# Patient Record
Sex: Female | Born: 1970 | Race: Black or African American | Hispanic: No | Marital: Single | Smoking: Never smoker
Health system: Southern US, Community
[De-identification: ages and names within clinical notes are randomized; demographics above are authoritative.]

## PROBLEM LIST (undated history)

## (undated) DIAGNOSIS — M726 Necrotizing fasciitis: Secondary | ICD-10-CM

## (undated) DIAGNOSIS — F1010 Alcohol abuse, uncomplicated: Secondary | ICD-10-CM

## (undated) HISTORY — DX: Necrotizing fasciitis: M72.6

## (undated) HISTORY — PX: TONSILLECTOMY: SUR1361

---

## 2019-11-06 ENCOUNTER — Other Ambulatory Visit: Payer: Self-pay

## 2019-12-21 ENCOUNTER — Emergency Department (HOSPITAL_COMMUNITY): Payer: Medicaid Other

## 2019-12-21 ENCOUNTER — Other Ambulatory Visit: Payer: Self-pay

## 2019-12-21 ENCOUNTER — Inpatient Hospital Stay (HOSPITAL_COMMUNITY)
Admission: EM | Admit: 2019-12-21 | Discharge: 2020-02-03 | DRG: 853 | Disposition: A | Discharge status: Skilled Nursing Facility | Payer: Self-pay | Attending: Internal Medicine | Admitting: Internal Medicine

## 2019-12-21 ENCOUNTER — Encounter (HOSPITAL_COMMUNITY): Payer: Self-pay

## 2019-12-21 DIAGNOSIS — R7303 Prediabetes: Secondary | ICD-10-CM | POA: Diagnosis present

## 2019-12-21 DIAGNOSIS — D62 Acute posthemorrhagic anemia: Secondary | ICD-10-CM | POA: Diagnosis not present

## 2019-12-21 DIAGNOSIS — R339 Retention of urine, unspecified: Secondary | ICD-10-CM | POA: Diagnosis not present

## 2019-12-21 DIAGNOSIS — Z09 Encounter for follow-up examination after completed treatment for conditions other than malignant neoplasm: Secondary | ICD-10-CM

## 2019-12-21 DIAGNOSIS — Z20822 Contact with and (suspected) exposure to covid-19: Secondary | ICD-10-CM | POA: Diagnosis present

## 2019-12-21 DIAGNOSIS — N179 Acute kidney failure, unspecified: Secondary | ICD-10-CM | POA: Diagnosis present

## 2019-12-21 DIAGNOSIS — R0600 Dyspnea, unspecified: Secondary | ICD-10-CM

## 2019-12-21 DIAGNOSIS — Z4659 Encounter for fitting and adjustment of other gastrointestinal appliance and device: Secondary | ICD-10-CM

## 2019-12-21 DIAGNOSIS — J9690 Respiratory failure, unspecified, unspecified whether with hypoxia or hypercapnia: Secondary | ICD-10-CM

## 2019-12-21 DIAGNOSIS — J9600 Acute respiratory failure, unspecified whether with hypoxia or hypercapnia: Secondary | ICD-10-CM

## 2019-12-21 DIAGNOSIS — W19XXXA Unspecified fall, initial encounter: Secondary | ICD-10-CM

## 2019-12-21 DIAGNOSIS — R601 Generalized edema: Secondary | ICD-10-CM

## 2019-12-21 DIAGNOSIS — F17200 Nicotine dependence, unspecified, uncomplicated: Secondary | ICD-10-CM | POA: Diagnosis present

## 2019-12-21 DIAGNOSIS — D696 Thrombocytopenia, unspecified: Secondary | ICD-10-CM | POA: Diagnosis present

## 2019-12-21 DIAGNOSIS — K7290 Hepatic failure, unspecified without coma: Secondary | ICD-10-CM | POA: Diagnosis present

## 2019-12-21 DIAGNOSIS — R6521 Severe sepsis with septic shock: Secondary | ICD-10-CM | POA: Diagnosis not present

## 2019-12-21 DIAGNOSIS — E43 Unspecified severe protein-calorie malnutrition: Secondary | ICD-10-CM | POA: Diagnosis not present

## 2019-12-21 DIAGNOSIS — I10 Essential (primary) hypertension: Secondary | ICD-10-CM | POA: Diagnosis present

## 2019-12-21 DIAGNOSIS — K7011 Alcoholic hepatitis with ascites: Secondary | ICD-10-CM | POA: Diagnosis present

## 2019-12-21 DIAGNOSIS — R491 Aphonia: Secondary | ICD-10-CM

## 2019-12-21 DIAGNOSIS — G9341 Metabolic encephalopathy: Secondary | ICD-10-CM | POA: Diagnosis present

## 2019-12-21 DIAGNOSIS — L89322 Pressure ulcer of left buttock, stage 2: Secondary | ICD-10-CM | POA: Diagnosis present

## 2019-12-21 DIAGNOSIS — R296 Repeated falls: Secondary | ICD-10-CM | POA: Diagnosis present

## 2019-12-21 DIAGNOSIS — Z1621 Resistance to vancomycin: Secondary | ICD-10-CM | POA: Diagnosis present

## 2019-12-21 DIAGNOSIS — R0689 Other abnormalities of breathing: Secondary | ICD-10-CM

## 2019-12-21 DIAGNOSIS — F10239 Alcohol dependence with withdrawal, unspecified: Secondary | ICD-10-CM | POA: Diagnosis present

## 2019-12-21 DIAGNOSIS — E878 Other disorders of electrolyte and fluid balance, not elsewhere classified: Secondary | ICD-10-CM | POA: Diagnosis not present

## 2019-12-21 DIAGNOSIS — L245 Irritant contact dermatitis due to other chemical products: Secondary | ICD-10-CM | POA: Diagnosis not present

## 2019-12-21 DIAGNOSIS — E8770 Fluid overload, unspecified: Secondary | ICD-10-CM | POA: Diagnosis not present

## 2019-12-21 DIAGNOSIS — E874 Mixed disorder of acid-base balance: Secondary | ICD-10-CM | POA: Diagnosis present

## 2019-12-21 DIAGNOSIS — R0682 Tachypnea, not elsewhere classified: Secondary | ICD-10-CM

## 2019-12-21 DIAGNOSIS — Z1611 Resistance to penicillins: Secondary | ICD-10-CM | POA: Diagnosis present

## 2019-12-21 DIAGNOSIS — R600 Localized edema: Secondary | ICD-10-CM | POA: Diagnosis not present

## 2019-12-21 DIAGNOSIS — K626 Ulcer of anus and rectum: Secondary | ICD-10-CM | POA: Diagnosis not present

## 2019-12-21 DIAGNOSIS — L03317 Cellulitis of buttock: Secondary | ICD-10-CM | POA: Diagnosis present

## 2019-12-21 DIAGNOSIS — K921 Melena: Secondary | ICD-10-CM | POA: Diagnosis not present

## 2019-12-21 DIAGNOSIS — R34 Anuria and oliguria: Secondary | ICD-10-CM | POA: Diagnosis not present

## 2019-12-21 DIAGNOSIS — K7682 Hepatic encephalopathy: Secondary | ICD-10-CM | POA: Diagnosis present

## 2019-12-21 DIAGNOSIS — Z6841 Body Mass Index (BMI) 40.0 and over, adult: Secondary | ICD-10-CM

## 2019-12-21 DIAGNOSIS — R7989 Other specified abnormal findings of blood chemistry: Secondary | ICD-10-CM

## 2019-12-21 DIAGNOSIS — L89816 Pressure-induced deep tissue damage of head: Secondary | ICD-10-CM | POA: Diagnosis not present

## 2019-12-21 DIAGNOSIS — J9811 Atelectasis: Secondary | ICD-10-CM | POA: Diagnosis not present

## 2019-12-21 DIAGNOSIS — M726 Necrotizing fasciitis: Secondary | ICD-10-CM | POA: Diagnosis present

## 2019-12-21 DIAGNOSIS — A4181 Sepsis due to Enterococcus: Principal | ICD-10-CM | POA: Diagnosis present

## 2019-12-21 DIAGNOSIS — D6489 Other specified anemias: Secondary | ICD-10-CM | POA: Diagnosis present

## 2019-12-21 DIAGNOSIS — A4159 Other Gram-negative sepsis: Secondary | ICD-10-CM | POA: Diagnosis present

## 2019-12-21 DIAGNOSIS — Z880 Allergy status to penicillin: Secondary | ICD-10-CM

## 2019-12-21 DIAGNOSIS — E870 Hyperosmolality and hypernatremia: Secondary | ICD-10-CM | POA: Diagnosis not present

## 2019-12-21 DIAGNOSIS — D6959 Other secondary thrombocytopenia: Secondary | ICD-10-CM | POA: Diagnosis present

## 2019-12-21 DIAGNOSIS — K567 Ileus, unspecified: Secondary | ICD-10-CM | POA: Diagnosis not present

## 2019-12-21 DIAGNOSIS — L8990 Pressure ulcer of unspecified site, unspecified stage: Secondary | ICD-10-CM | POA: Insufficient documentation

## 2019-12-21 DIAGNOSIS — E876 Hypokalemia: Secondary | ICD-10-CM | POA: Diagnosis not present

## 2019-12-21 DIAGNOSIS — Z66 Do not resuscitate: Secondary | ICD-10-CM | POA: Diagnosis not present

## 2019-12-21 DIAGNOSIS — Z01818 Encounter for other preprocedural examination: Secondary | ICD-10-CM

## 2019-12-21 DIAGNOSIS — K117 Disturbances of salivary secretion: Secondary | ICD-10-CM

## 2019-12-21 DIAGNOSIS — E860 Dehydration: Secondary | ICD-10-CM | POA: Diagnosis present

## 2019-12-21 DIAGNOSIS — J9601 Acute respiratory failure with hypoxia: Secondary | ICD-10-CM | POA: Diagnosis not present

## 2019-12-21 DIAGNOSIS — Z933 Colostomy status: Secondary | ICD-10-CM

## 2019-12-21 DIAGNOSIS — E871 Hypo-osmolality and hyponatremia: Secondary | ICD-10-CM | POA: Diagnosis present

## 2019-12-21 DIAGNOSIS — Z452 Encounter for adjustment and management of vascular access device: Secondary | ICD-10-CM

## 2019-12-21 DIAGNOSIS — A419 Sepsis, unspecified organism: Secondary | ICD-10-CM

## 2019-12-21 DIAGNOSIS — Z0189 Encounter for other specified special examinations: Secondary | ICD-10-CM

## 2019-12-21 DIAGNOSIS — Y92009 Unspecified place in unspecified non-institutional (private) residence as the place of occurrence of the external cause: Secondary | ICD-10-CM

## 2019-12-21 DIAGNOSIS — R531 Weakness: Secondary | ICD-10-CM

## 2019-12-21 DIAGNOSIS — J3801 Paralysis of vocal cords and larynx, unilateral: Secondary | ICD-10-CM | POA: Diagnosis not present

## 2019-12-21 DIAGNOSIS — Z23 Encounter for immunization: Secondary | ICD-10-CM

## 2019-12-21 DIAGNOSIS — F1020 Alcohol dependence, uncomplicated: Secondary | ICD-10-CM | POA: Diagnosis present

## 2019-12-21 DIAGNOSIS — K7010 Alcoholic hepatitis without ascites: Secondary | ICD-10-CM

## 2019-12-21 DIAGNOSIS — K709 Alcoholic liver disease, unspecified: Secondary | ICD-10-CM

## 2019-12-21 DIAGNOSIS — K7460 Unspecified cirrhosis of liver: Secondary | ICD-10-CM | POA: Diagnosis present

## 2019-12-21 DIAGNOSIS — R54 Age-related physical debility: Secondary | ICD-10-CM | POA: Diagnosis present

## 2019-12-21 DIAGNOSIS — A4151 Sepsis due to Escherichia coli [E. coli]: Secondary | ICD-10-CM | POA: Diagnosis present

## 2019-12-21 LAB — RAPID URINE DRUG SCREEN, HOSP PERFORMED
Amphetamines: NOT DETECTED
Barbiturates: NOT DETECTED
Benzodiazepines: NOT DETECTED
Cocaine: NOT DETECTED
Opiates: NOT DETECTED
Tetrahydrocannabinol: NOT DETECTED

## 2019-12-21 LAB — COMPREHENSIVE METABOLIC PANEL
ALT: 58 U/L — ABNORMAL HIGH (ref 0–44)
AST: 199 U/L — ABNORMAL HIGH (ref 15–41)
Albumin: 1.8 g/dL — ABNORMAL LOW (ref 3.5–5.0)
Alkaline Phosphatase: 242 U/L — ABNORMAL HIGH (ref 38–126)
Anion gap: 15 (ref 5–15)
BUN: 35 mg/dL — ABNORMAL HIGH (ref 6–20)
CO2: 19 mmol/L — ABNORMAL LOW (ref 22–32)
Calcium: 8.2 mg/dL — ABNORMAL LOW (ref 8.9–10.3)
Chloride: 97 mmol/L — ABNORMAL LOW (ref 98–111)
Creatinine, Ser: 1.74 mg/dL — ABNORMAL HIGH (ref 0.44–1.00)
GFR calc Af Amer: 40 mL/min — ABNORMAL LOW (ref >60)
GFR calc non Af Amer: 35 mL/min — ABNORMAL LOW (ref >60)
Glucose, Bld: 110 mg/dL — ABNORMAL HIGH (ref 70–99)
Potassium: 4.4 mmol/L (ref 3.5–5.1)
Sodium: 131 mmol/L — ABNORMAL LOW (ref 135–145)
Total Bilirubin: 3.5 mg/dL — ABNORMAL HIGH (ref 0.3–1.2)
Total Protein: 5.6 g/dL — ABNORMAL LOW (ref 6.5–8.1)

## 2019-12-21 LAB — ACETAMINOPHEN LEVEL: Acetaminophen (Tylenol), Serum: <10 ug/mL — ABNORMAL LOW (ref 10–30)

## 2019-12-21 LAB — URINALYSIS, ROUTINE W REFLEX MICROSCOPIC
Glucose, UA: NEGATIVE mg/dL
Ketones, ur: 5 mg/dL — AB
Nitrite: NEGATIVE
Protein, ur: 100 mg/dL — AB
Specific Gravity, Urine: 1.024 (ref 1.005–1.030)
pH: 5 (ref 5.0–8.0)

## 2019-12-21 LAB — CBC
HCT: 45 % (ref 36.0–46.0)
Hemoglobin: 14.5 g/dL (ref 12.0–15.0)
MCH: 32.3 pg (ref 26.0–34.0)
MCHC: 32.2 g/dL (ref 30.0–36.0)
MCV: 100.2 fL — ABNORMAL HIGH (ref 80.0–100.0)
Platelets: 67 10*3/uL — ABNORMAL LOW (ref 150–400)
RBC: 4.49 MIL/uL (ref 3.87–5.11)
RDW: 14.9 % (ref 11.5–15.5)
WBC: 11.8 10*3/uL — ABNORMAL HIGH (ref 4.0–10.5)
nRBC: 0 % (ref 0.0–0.2)

## 2019-12-21 LAB — SARS CORONAVIRUS 2 BY RT PCR (HOSPITAL ORDER, PERFORMED IN ~~LOC~~ HOSPITAL LAB): SARS Coronavirus 2: NEGATIVE

## 2019-12-21 LAB — CBG MONITORING, ED: Glucose-Capillary: 127 mg/dL — ABNORMAL HIGH (ref 70–99)

## 2019-12-21 LAB — SALICYLATE LEVEL: Salicylate Lvl: <7 mg/dL — ABNORMAL LOW (ref 7.0–30.0)

## 2019-12-21 LAB — HCG, QUANTITATIVE, PREGNANCY: hCG, Beta Chain, Quant, S: 1 m[IU]/mL (ref <5)

## 2019-12-21 LAB — AMMONIA: Ammonia: 76 umol/L — ABNORMAL HIGH (ref 9–35)

## 2019-12-21 LAB — ETHANOL: Alcohol, Ethyl (B): <10 mg/dL (ref <10)

## 2019-12-21 LAB — LACTIC ACID, PLASMA: Lactic Acid, Venous: 3.6 mmol/L (ref 0.5–1.9)

## 2019-12-21 MED ORDER — THIAMINE HCL 100 MG/ML IJ SOLN
Freq: Once | INTRAVENOUS | Status: AC
  Filled 2019-12-21: qty 1000

## 2019-12-21 MED ORDER — ACETAMINOPHEN 325 MG PO TABS
650.0000 mg | ORAL_TABLET | Freq: Four times a day (QID) | ORAL | Status: DC | PRN
  Administered 2019-12-21: 650 mg via ORAL
  Filled 2019-12-21: qty 2

## 2019-12-21 MED ORDER — SODIUM CHLORIDE 0.9 % IV BOLUS
1000.0000 mL | Freq: Once | INTRAVENOUS | Status: AC
  Administered 2019-12-21: 1000 mL via INTRAVENOUS

## 2019-12-21 MED ORDER — FENTANYL CITRATE (PF) 100 MCG/2ML IJ SOLN
50.0000 ug | Freq: Once | INTRAMUSCULAR | Status: AC
  Administered 2019-12-21: 50 ug via INTRAVENOUS
  Filled 2019-12-21: qty 2

## 2019-12-21 MED ORDER — LORAZEPAM 2 MG/ML IJ SOLN
1.0000 mg | INTRAMUSCULAR | Status: DC | PRN
  Administered 2019-12-22: 1 mg via INTRAVENOUS
  Filled 2019-12-21 (×2): qty 1

## 2019-12-21 MED ORDER — HEPARIN SODIUM (PORCINE) 5000 UNIT/ML IJ SOLN
5000.0000 [IU] | Freq: Three times a day (TID) | INTRAMUSCULAR | Status: DC
  Administered 2019-12-21 – 2019-12-22 (×2): 5000 [IU] via SUBCUTANEOUS
  Filled 2019-12-21 (×2): qty 1

## 2019-12-21 MED ORDER — DEXTROSE-NACL 5-0.9 % IV SOLN: INTRAVENOUS | Status: DC

## 2019-12-21 MED ORDER — LACTULOSE 10 GM/15ML PO SOLN
30.0000 g | Freq: Once | ORAL | Status: AC
  Administered 2019-12-21: 30 g via ORAL
  Filled 2019-12-21: qty 60

## 2019-12-21 MED ORDER — LORAZEPAM 1 MG PO TABS
1.0000 mg | ORAL_TABLET | ORAL | Status: DC | PRN
  Administered 2019-12-21 – 2019-12-22 (×2): 1 mg via ORAL
  Filled 2019-12-21 (×2): qty 1

## 2019-12-21 MED ORDER — ADULT MULTIVITAMIN W/MINERALS CH
1.0000 | ORAL_TABLET | Freq: Every day | ORAL | Status: DC
  Administered 2019-12-22: 1 via ORAL
  Filled 2019-12-21: qty 1

## 2019-12-21 MED ORDER — FOLIC ACID 1 MG PO TABS
1.0000 mg | ORAL_TABLET | Freq: Every day | ORAL | Status: DC
  Administered 2019-12-22: 1 mg via ORAL
  Filled 2019-12-21: qty 1

## 2019-12-21 MED ORDER — ONDANSETRON HCL 4 MG PO TABS: 4.0000 mg | ORAL_TABLET | Freq: Four times a day (QID) | ORAL | Status: DC | PRN

## 2019-12-21 MED ORDER — SODIUM CHLORIDE 0.9% FLUSH
3.0000 mL | Freq: Once | INTRAVENOUS | Status: AC
  Administered 2019-12-21: 3 mL via INTRAVENOUS

## 2019-12-21 MED ORDER — THIAMINE HCL 100 MG PO TABS
100.0000 mg | ORAL_TABLET | Freq: Every day | ORAL | Status: DC
  Administered 2019-12-22: 100 mg via ORAL
  Filled 2019-12-21: qty 1

## 2019-12-21 MED ORDER — ONDANSETRON HCL 4 MG/2ML IJ SOLN
4.0000 mg | Freq: Four times a day (QID) | INTRAMUSCULAR | Status: DC | PRN
  Filled 2019-12-21: qty 2

## 2019-12-21 MED ORDER — LORAZEPAM 2 MG/ML IJ SOLN
0.0000 mg | Freq: Four times a day (QID) | INTRAMUSCULAR | Status: DC
  Administered 2019-12-22 (×3): 1 mg via INTRAVENOUS
  Filled 2019-12-21 (×2): qty 1

## 2019-12-21 MED ORDER — LORAZEPAM 2 MG/ML IJ SOLN: 0.0000 mg | Freq: Two times a day (BID) | INTRAMUSCULAR | Status: DC

## 2019-12-21 MED ORDER — THIAMINE HCL 100 MG/ML IJ SOLN: 100.0000 mg | Freq: Every day | INTRAMUSCULAR | Status: DC

## 2019-12-21 NOTE — ED Notes (Signed)
Patient has been stuck x5 to obtain ammonia and repeat CMP. Main lab phlebotomy called to assist.

## 2019-12-21 NOTE — ED Notes (Signed)
Patient is actively hallucinating at this time. Patient is seeing people who are not there and reports they have donuts and her son is being rude by not talking to them. Patient is frustrated and talking rapidly and is sometimes difficult to understand.

## 2019-12-21 NOTE — ED Notes (Signed)
Attempted to call 5W to give report, but no answer.

## 2019-12-21 NOTE — ED Notes (Signed)
2 sets of blood cultures sent down to lab in case of future order for cultures

## 2019-12-21 NOTE — ED Triage Notes (Signed)
Patient's son reports his grandma has early onset dementia. Patient's son reports she has been falling recently, has not been eating. Patient's son reports she walked yesterday, and today she can barely walk. Patient's son reports she drinks alcohol frequently but that she has not been drunk recently. Patient's son also reports she was hallucinating, seeing bugs and black spots in her periphery.

## 2019-12-21 NOTE — Progress Notes (Signed)
Called to get report , [place] ED RN is busy in a room doing Sepsis workup, Estill Bamberg NS will have her to call me back.

## 2019-12-21 NOTE — H&P (Signed)
History and Physical   Alyssa Carey RFX:[id number] DOB: Oct 30, 1970 DOA: 12/21/2019  Referring MD/NP/PA: Dr. Calvert Cantor  PCP: Patient, No Pcp Per   Outpatient Specialists: None  Patient coming from: Home  Chief Complaint: Fall and weakness  HPI: Alyssa Carey is a 49 y.o. female with medical history significant of alcohol abuse with no prior chronic medical problems who has progressively been getting weak over the last 1 week.  Brought in by her son due to poor appetite depressed mood.  This has been going on since her mother died recently.  Patient has chest pain nausea vomiting abdominal pain.  Her last drink was about a week ago.  She was seen in the ER after a fall at home.  Patient has been having hallucinations medially visual.  Past on her last drink was yesterday although patient said it was a week ago.  She was diagnosed with hypertension before but not on any medications.  Patient seen and evaluated and been admitted with alcoholic hepatitis based on her numbers.  She had thrombocytopenia acute renal failure and mild hepatic encephalopathy..  ED Course: Temperature 98.4 blood pressure 128/74 pulse 138 respirate 18 oxygen sats 90% room air.  Sodium 131.  Potassium 4 chloride 97 CO2 19 glucose 110.  BUN 35 creatinine 1.74.  LFTs were elevated with AST 199 ALT 958 bilirubin 3.5 and total protein 5.6.  Lactic acid 2.9.  Urinalysis showed cloudy urine with large hemoglobin.  Urine drug screen is negative.  Patient being admitted for management of alcoholic hepatitis.  Review of Systems: As per HPI otherwise 10 point review of systems negative.    History reviewed. No pertinent past medical history.  History reviewed. No pertinent surgical history.   reports that she has been smoking. She does not have any smokeless tobacco history on file. She reports current alcohol use. She reports previous drug use.  Allergies  Allergen Reactions  . Penicillins     Did it involve swelling  of the face/tongue/throat, SOB, or low BP? N Did it involve sudden or severe rash/hives, skin peeling, or any reaction on the inside of your mouth or nose? Y Did you need to seek medical attention at a hospital or doctor's office? N When did it last happen?Childhood If all above answers are "NO", may proceed with cephalosporin use.     History reviewed. No pertinent family history.   Prior to Admission medications   Medication Sig Start Date End Date Taking? Authorizing Provider  diphenhydramine-acetaminophen (TYLENOL PM) 25-500 MG TABS tablet Take 1 tablet by mouth at bedtime as needed (pain).   Yes [provider]  ibuprofen (ADVIL) 200 MG tablet Take 200 mg by mouth every 6 (six) hours as needed for moderate pain.   Yes [provider]    Physical Exam: Vitals:   12/21/19 1814 12/21/19 1815 12/21/19 1823 12/21/19 1855  BP: 111/78 111/78  110/76  Pulse: (!) 123  100 (!) 102  Resp: (!) 29 (!) 33 (!) 25 (!) 24  Temp: 97.7 F (36.5 C)   98.1 F (36.7 C)  TempSrc:      SpO2: 100%  100% 100%      Constitutional: Stable weak, no distress, drowsy Vitals:   12/21/19 1814 12/21/19 1815 12/21/19 1823 12/21/19 1855  BP: 111/78 111/78  110/76  Pulse: (!) 123  100 (!) 102  Resp: (!) 29 (!) 33 (!) 25 (!) 24  Temp: 97.7 F (36.5 C)   98.1 F (36.7 C)  TempSrc:      SpO2: 100%  100% 100%   Eyes: PERRL, lids and conjunctivae normal ENMT: Mucous membranes are moist. Posterior pharynx clear of any exudate or lesions.Normal dentition.  Neck: normal, supple, no masses, no thyromegaly Respiratory: clear to auscultation bilaterally, no wheezing, no crackles. Normal respiratory effort. No accessory muscle use.  Cardiovascular: Sinus tachycardia no murmurs / rubs / gallops. No extremity edema. 2+ pedal pulses. No carotid bruits.  Abdomen: no tenderness, no masses palpated. No hepatosplenomegaly. Bowel sounds positive.  Musculoskeletal: no clubbing / cyanosis. No  joint deformity upper and lower extremities. Good ROM, no contractures. Normal muscle tone.  Skin: no rashes, lesions, ulcers. No induration Neurologic: CN 2-12 grossly intact. Sensation intact, DTR normal. Strength 5/5 in all 4.  Psychiatric: Confused, no agitation   Labs on Admission: I have personally reviewed following labs and imaging studies  CBC: Recent Labs  Lab 12/21/19 1448  WBC 11.8*  HGB 14.5  HCT 45.0  MCV 100.2*  PLT 67*   Basic Metabolic Panel: Recent Labs  Lab 12/21/19 1658  NA 131*  K 4.4  CL 97*  CO2 19*  GLUCOSE 110*  BUN 35*  CREATININE 1.74*  CALCIUM 8.2*   GFR: CrCl cannot be calculated (Unknown ideal weight.). Liver Function Tests: Recent Labs  Lab 12/21/19 1658  AST 199*  ALT 58*  ALKPHOS 242*  BILITOT 3.5*  PROT 5.6*  ALBUMIN 1.8*   No results for input(s): LIPASE, AMYLASE in the last 168 hours. Recent Labs  Lab 12/21/19 1658  AMMONIA 76*   Coagulation Profile: No results for input(s): INR, PROTIME in the last 168 hours. Cardiac Enzymes: No results for input(s): CKTOTAL, CKMB, CKMBINDEX, TROPONINI in the last 168 hours. BNP (last 3 results) No results for input(s): PROBNP in the last 8760 hours. HbA1C: No results for input(s): HGBA1C in the last 72 hours. CBG: Recent Labs  Lab 12/21/19 1521  GLUCAP 127*   Lipid Profile: No results for input(s): CHOL, HDL, LDLCALC, TRIG, CHOLHDL, LDLDIRECT in the last 72 hours. Thyroid Function Tests: No results for input(s): TSH, T4TOTAL, FREET4, T3FREE, THYROIDAB in the last 72 hours. Anemia Panel: No results for input(s): VITAMINB12, FOLATE, FERRITIN, TIBC, IRON, RETICCTPCT in the last 72 hours. Urine analysis: No results found for: COLORURINE, APPEARANCEUR, LABSPEC, PHURINE, GLUCOSEU, HGBUR, BILIRUBINUR, KETONESUR, PROTEINUR, UROBILINOGEN, NITRITE, LEUKOCYTESUR Sepsis Labs: @LABRCNTIP (procalcitonin:4,lacticidven:4) )No results found for this or any previous visit (from the past 240  hour(s)).   Radiological Exams on Admission: DG Sacrum/Coccyx  Result Date: 12/21/2019 CLINICAL DATA:  [place] last week due to weakness, continued sacrococcygeal pain EXAM: SACRUM AND COCCYX - 2+ VIEW COMPARISON:  None FINDINGS: Osseous demineralization. Narrowing of LEFT hip joint. SI joints and RIGHT hip joint space preserved. Sacral foramina symmetric. No definite sacrococcygeal fracture identified. IMPRESSION: No definite sacrococcygeal fracture identified. Degenerative changes LEFT hip joint. Electronically Signed   By: Lavonia Dana M.D.   On: 12/21/2019 18:10   CT Head Wo Contrast  Result Date: 12/21/2019 CLINICAL DATA:  Weakness. EXAM: CT HEAD WITHOUT CONTRAST TECHNIQUE: Contiguous axial images were obtained from the base of the skull through the vertex without intravenous contrast. COMPARISON:  None. FINDINGS: Brain: Ventricles and sulci are appropriate for patient's age. No evidence for acute cortically based infarct, intracranial hemorrhage, mass lesion or mass-effect. Vascular: Unremarkable Skull: Intact. Sinuses/Orbits: Paranasal sinuses well aerated. Mastoid air cells unremarkable. Orbits unremarkable. Other: None. IMPRESSION: No acute intracranial process. Electronically Signed   By: Polly Cobia.D.  On: 12/21/2019 15:49      Assessment/Plan Principal Problem:   Hepatic encephalopathy (HCC) Active Problems:   Fall at home, initial encounter   Weakness   Alcoholism ([place])   Thrombocytopenia ([place])   ARF (acute renal failure) ([place])   Acute alcoholic hepatitis     #1  Alcoholic hepatitis with hepatic encephalopathy: Patient will be admitted for work-up.  Acute hepatitis panel ordered.  IV fluids supportive care pain management.  Close monitoring.  GI consult in the morning.  #2 fall with weakness: Secondary to hepatic encephalopathy.  Continue treatment.  #3 acute renal failure: Renal function much improved.  Continue treatment.  #4 history of alcoholism: CIWA  protocol.   DVT prophylaxis: Heparin Code Status: Full code Family Communication: Son at bedside Disposition Plan: Home Consults called: None Admission status: Inpatient  Severity of Illness: The appropriate patient status for this patient is INPATIENT. Inpatient status is judged to be reasonable and necessary in order to provide the required intensity of service to ensure the patient's safety. The patient's presenting symptoms, physical exam findings, and initial radiographic and laboratory data in the context of their chronic comorbidities is felt to place them at high risk for further clinical deterioration. Furthermore, it is not anticipated that the patient will be medically stable for discharge from the hospital within 2 midnights of admission. The following factors support the patient status of inpatient.   " The patient's presenting symptoms include fall with generalized weakness. " The worrisome physical exam findings include confused. " The initial radiographic and laboratory data are worrisome because of lactic acid of 2.9. " The chronic co-morbidities include alcoholism.   * I certify that at the point of admission it is my clinical judgment that the patient will require inpatient hospital care spanning beyond 2 midnights from the point of admission due to high intensity of service, high risk for further deterioration and high frequency of surveillance required.Barbette Merino MD [facility] Pager [phone]  If 7PM-7AM, please contact night-coverage [email or web address] Password [email or web address]  12/21/2019, 7:03 PM

## 2019-12-21 NOTE — ED Notes (Addendum)
Attempted to call report to [place], RN on 5W, but no answer.

## 2019-12-21 NOTE — Progress Notes (Signed)
Calling ED for report no one pick up

## 2019-12-21 NOTE — Progress Notes (Signed)
   12/21/19 2228  Assess: MEWS Score  Temp 98.4 F (36.9 C)  BP 121/70  Pulse Rate (!) 113  Resp (!) 22  SpO2 100 %  Assess: MEWS Score  MEWS Temp 0  MEWS Systolic 0  MEWS Pulse 2  MEWS RR 1  MEWS LOC 0  MEWS Score 3  MEWS Score Color Yellow  Assess: if the MEWS score is Yellow or Red  Were vital signs taken at a resting state? Yes  Focused Assessment Documented focused assessment  Early Detection of Sepsis Score *See Row Information* Medium  MEWS guidelines implemented *See Row Information* Yes  Treat  MEWS Interventions Administered scheduled meds/treatments  Take Vital Signs  Increase Vital Sign Frequency  Yellow: Q 2hr X 2 then Q 4hr X 2, if remains yellow, continue Q 4hrs  Escalate  MEWS: Escalate Yellow: discuss with charge nurse/RN and consider discussing with provider and RRT  Notify: Charge Nurse/RN  Name of Charge Nurse/RN Notified Raquel Sarna   Date Charge Nurse/RN Notified 12/21/19  Time Charge Nurse/RN Notified 2230  patient was also tachy in the ED. Patient is AxO X4 ,has pain 6/10 in pressure wounds will give tylenol per order.

## 2019-12-21 NOTE — ED Provider Notes (Signed)
[facility] DEPT Provider Note   CSN: [id number] Arrival date & time: 12/21/19  1426     History Chief Complaint  Patient presents with  . Fall  . Weakness    Alyssa Carey is a 49 y.o. female.  HPI Patient with no significant PMH reports increasing weakness over the last week or so. She has had poor appetite recently and some depressed mood since her mother died. She denies any CP, SOB, N/V/D or abdominal pain. Denies any polyuria but does have increased thirst. She does not take any medications on a regular basis, but reports she was previously diagnosed with HTN. She has no known history of DM. Her son at bedside reports she has had some hallucinations recently, seeing bugs. She reports heavy EtOH use but none since yesterday and no history of withdrawal symptoms or seizures. Denies illicit drug use.     History reviewed. No pertinent past medical history.  Patient Active Problem List   Diagnosis Date Noted  . Fall at home, initial encounter 12/21/2019  . Weakness 12/21/2019  . Alcoholism ([place]) 12/21/2019  . Thrombocytopenia ([place]) 12/21/2019  . ARF (acute renal failure) ([place]) 12/21/2019  . Hepatic encephalopathy ([place]) 12/21/2019  . Acute alcoholic hepatitis 33/83/2919    History reviewed. No pertinent surgical history.   OB History   No obstetric history on file.     History reviewed. No pertinent family history.  Social History   Tobacco Use  . Smoking status: Current Some Day Smoker  Substance Use Topics  . Alcohol use: Yes  . Drug use: Not Currently    Home Medications Prior to Admission medications   Medication Sig Start Date End Date Taking? Authorizing Provider  diphenhydramine-acetaminophen (TYLENOL PM) 25-500 MG TABS tablet Take 1 tablet by mouth at bedtime as needed (pain).   Yes [provider]  ibuprofen (ADVIL) 200 MG tablet Take 200 mg by mouth every 6 (six) hours as needed for moderate pain.   Yes  [provider]    Allergies    Penicillins  Review of Systems   Review of Systems A comprehensive review of systems was completed and negative except as noted in HPI.   Physical Exam Updated Vital Signs BP 91/68 (BP Location: Left Arm)   Pulse (!) 138   Temp 97.7 F (36.5 C) (Oral)   Resp 18   SpO2 99%   Physical Exam Vitals and nursing note reviewed.  Constitutional:      Appearance: Normal appearance.  HENT:     Head: Normocephalic and atraumatic.     Nose: Nose normal.     Mouth/Throat:     Mouth: Mucous membranes are dry.  Eyes:     Extraocular Movements: Extraocular movements intact.     Conjunctiva/sclera: Conjunctivae normal.  Cardiovascular:     Rate and Rhythm: Tachycardia present.  Pulmonary:     Effort: Pulmonary effort is normal.     Breath sounds: Normal breath sounds.  Abdominal:     General: Abdomen is flat.     Palpations: Abdomen is soft.     Tenderness: There is no abdominal tenderness.  Musculoskeletal:        General: No swelling. Normal range of motion.     Cervical back: Neck supple.  Skin:    General: Skin is warm and dry.  Neurological:     General: No focal deficit present.     Mental Status: She is alert.     Comments: Generalized weakness,  but no focal deficits, moving all extremities  Psychiatric:     Comments: tearful     ED Results / Procedures / Treatments   Labs (all labs ordered are listed, but only abnormal results are displayed) Labs Reviewed  CBC - Abnormal; Notable for the following components:      Result Value   WBC 11.8 (*)    MCV 100.2 (*)    Platelets 67 (*)    All other components within normal limits  SALICYLATE LEVEL - Abnormal; Notable for the following components:   Salicylate Lvl <8.0 (*)    All other components within normal limits  ACETAMINOPHEN LEVEL - Abnormal; Notable for the following components:   Acetaminophen (Tylenol), Serum <10 (*)    All other components within normal limits    LACTIC ACID, PLASMA - Abnormal; Notable for the following components:   Lactic Acid, Venous 3.6 (*)    All other components within normal limits  AMMONIA - Abnormal; Notable for the following components:   Ammonia 76 (*)    All other components within normal limits  COMPREHENSIVE METABOLIC PANEL - Abnormal; Notable for the following components:   Sodium 131 (*)    Chloride 97 (*)    CO2 19 (*)    Glucose, Bld 110 (*)    BUN 35 (*)    Creatinine, Ser 1.74 (*)    Calcium 8.2 (*)    Total Protein 5.6 (*)    Albumin 1.8 (*)    AST 199 (*)    ALT 58 (*)    Alkaline Phosphatase 242 (*)    Total Bilirubin 3.5 (*)    GFR calc non Af Amer 35 (*)    GFR calc Af Amer 40 (*)    All other components within normal limits  URINALYSIS, ROUTINE W REFLEX MICROSCOPIC - Abnormal; Notable for the following components:   Color, Urine AMBER (*)    APPearance CLOUDY (*)    Hgb urine dipstick LARGE (*)    Bilirubin Urine SMALL (*)    Ketones, ur 5 (*)    Protein, ur 100 (*)    Leukocytes,Ua TRACE (*)    Bacteria, UA RARE (*)    Crystals PRESENT (*)    All other components within normal limits  CBG MONITORING, ED - Abnormal; Notable for the following components:   Glucose-Capillary 127 (*)    All other components within normal limits  SARS CORONAVIRUS 2 BY RT PCR (HOSPITAL ORDER, [facility])  CULTURE, BLOOD (ROUTINE X 2)  CULTURE, BLOOD (ROUTINE X 2)  ETHANOL  RAPID URINE DRUG SCREEN, HOSP PERFORMED  HCG, QUANTITATIVE, PREGNANCY  LACTIC ACID, PLASMA  COMPREHENSIVE METABOLIC PANEL  MAGNESIUM  PHOSPHORUS  CBC  HIV ANTIBODY (ROUTINE TESTING W REFLEX)  CBC  COMPREHENSIVE METABOLIC PANEL  AMMONIA  PROTIME-INR    EKG EKG Interpretation  Date/Time:  Sunday December 21 2019 15:01:25 EDT Ventricular Rate:  130 PR Interval:    QRS Duration: 84 QT Interval:  311 QTC Calculation: 458 R Axis:   20 Text Interpretation: Sinus tachycardia Minimal ST depression, lateral  leads No old tracing to compare Confirmed by Calvert Cantor [phone]) on 12/21/2019 3:17:41 PM   Radiology DG Sacrum/Coccyx  Result Date: 12/21/2019 CLINICAL DATA:  [place] last week due to weakness, continued sacrococcygeal pain EXAM: SACRUM AND COCCYX - 2+ VIEW COMPARISON:  None FINDINGS: Osseous demineralization. Narrowing of LEFT hip joint. SI joints and RIGHT hip joint space preserved. Sacral foramina symmetric. No definite  sacrococcygeal fracture identified. IMPRESSION: No definite sacrococcygeal fracture identified. Degenerative changes LEFT hip joint. Electronically Signed   By: Lavonia Dana M.D.   On: 12/21/2019 18:10   CT Head Wo Contrast  Result Date: 12/21/2019 CLINICAL DATA:  Weakness. EXAM: CT HEAD WITHOUT CONTRAST TECHNIQUE: Contiguous axial images were obtained from the base of the skull through the vertex without intravenous contrast. COMPARISON:  None. FINDINGS: Brain: Ventricles and sulci are appropriate for patient's age. No evidence for acute cortically based infarct, intracranial hemorrhage, mass lesion or mass-effect. Vascular: Unremarkable Skull: Intact. Sinuses/Orbits: Paranasal sinuses well aerated. Mastoid air cells unremarkable. Orbits unremarkable. Other: None. IMPRESSION: No acute intracranial process. Electronically Signed   By: Lovey Newcomer M.D.   On: 12/21/2019 15:49    Procedures Procedures (including critical care time)  Medications Ordered in ED Medications  LORazepam (ATIVAN) tablet 1-4 mg (has no administration in time range)    Or  LORazepam (ATIVAN) injection 1-4 mg (has no administration in time range)  thiamine tablet 100 mg (has no administration in time range)    Or  thiamine (B-1) injection 100 mg (has no administration in time range)  folic acid (FOLVITE) tablet 1 mg (has no administration in time range)  multivitamin with minerals tablet 1 tablet (has no administration in time range)  LORazepam (ATIVAN) injection 0-4 mg (has no administration in time  range)    Followed by  LORazepam (ATIVAN) injection 0-4 mg (has no administration in time range)  heparin injection 5,000 Units (5,000 Units Subcutaneous Given 12/21/19 2314)  sodium chloride 0.9 % 1,000 mL with thiamine 923 mg, folic acid 1 mg, multivitamins adult 10 mL infusion (has no administration in time range)  dextrose 5 %-0.9 % sodium chloride infusion (has no administration in time range)  ondansetron (ZOFRAN) tablet 4 mg (has no administration in time range)    Or  ondansetron (ZOFRAN) injection 4 mg (has no administration in time range)  acetaminophen (TYLENOL) tablet 650 mg (650 mg Oral Given 12/21/19 2314)  sodium chloride flush (NS) 0.9 % injection 3 mL (3 mLs Intravenous Given 12/21/19 1742)  sodium chloride 0.9 % bolus 1,000 mL (0 mLs Intravenous Stopped 12/21/19 1828)  fentaNYL (SUBLIMAZE) injection 50 mcg (50 mcg Intravenous Given 12/21/19 1742)  sodium chloride 0.9 % bolus 1,000 mL (0 mLs Intravenous Stopped 12/21/19 2041)  lactulose (CHRONULAC) 10 GM/15ML solution 30 g (30 g Oral Given 12/21/19 1919)    ED Course  I have reviewed the triage vital signs and the nursing notes.  Pertinent labs & imaging results that were available during my care of the patient were reviewed by me and considered in my medical decision making (see chart for details).  Clinical Course as of Dec 20 2317  Nancy Fetter Dec 21, 2019  1546 Per RN patient hallucinating a 'donut woman' in the room. Will recheck BP after IVF and may need to give Ativan for possible EtOH withdrawal hallucinations.    [CS]  3007 BP and HR improving with IVF   [CS]  6226 CT is neg for acute process.    [CS]  1626 Lactic acid and WBC both mildly elevated. Will add cultures. No source of infection identified at this time.    [CS]  3335 Patient's CMP with multiple abnormal values. Ammonia is also elevated, suspect that her symptoms are due to alcoholic liver disease. Per RN, patient is also having tailbone pain after a fall, will send for  Xray.   [CS]  [facility] Xray neg for fracture  of coccyx. Discussed findings with the son and patient. Recommend admission for further evaluation. Hospitalists paged.    [CS]  6773 Spoke with Dr. Jonelle Sidle, Hospitalist who will admit.    [CS]    Clinical Course User Index [CS] Truddie Hidden, MD   MDM Rules/Calculators/A&P                      Patient appears clinically dry, noted to be tachycardic and hypotensive but not febrile. No symptoms suggestive of infection to suspect sepsis. Will check labs, including EtOH, UDS and send for head CT. IVF bolus.  Final Clinical Impression(s) / ED Diagnoses Final diagnoses:  Alcoholic liver disease ([place])  Hepatic encephalopathy ([place])    Rx / DC Orders ED Discharge Orders    None       Truddie Hidden, MD 12/21/19 2319

## 2019-12-21 NOTE — ED Triage Notes (Signed)
Pt reports she has recently become progressively weak to the point that she can not get around. Pt reports having to lift her legs to move around. Pt reports having a fall last week due to the weakness. Pt reports her brother has been helping her get around, but decided she should come in to get help.

## 2019-12-21 NOTE — ED Notes (Signed)
3rd attempt to call report to 5W. Both primary RN and charge RN are busy and will call back.

## 2019-12-22 ENCOUNTER — Encounter (HOSPITAL_COMMUNITY): Payer: Self-pay | Admitting: Internal Medicine

## 2019-12-22 ENCOUNTER — Inpatient Hospital Stay (HOSPITAL_COMMUNITY): Payer: Medicaid Other

## 2019-12-22 DIAGNOSIS — R531 Weakness: Secondary | ICD-10-CM

## 2019-12-22 DIAGNOSIS — D696 Thrombocytopenia, unspecified: Secondary | ICD-10-CM

## 2019-12-22 DIAGNOSIS — L8990 Pressure ulcer of unspecified site, unspecified stage: Secondary | ICD-10-CM | POA: Insufficient documentation

## 2019-12-22 LAB — COMPREHENSIVE METABOLIC PANEL
ALT: 58 U/L — ABNORMAL HIGH (ref 0–44)
AST: 207 U/L — ABNORMAL HIGH (ref 15–41)
Albumin: 1.8 g/dL — ABNORMAL LOW (ref 3.5–5.0)
Alkaline Phosphatase: 240 U/L — ABNORMAL HIGH (ref 38–126)
Anion gap: 15 (ref 5–15)
BUN: 39 mg/dL — ABNORMAL HIGH (ref 6–20)
CO2: 16 mmol/L — ABNORMAL LOW (ref 22–32)
Calcium: 7.9 mg/dL — ABNORMAL LOW (ref 8.9–10.3)
Chloride: 102 mmol/L (ref 98–111)
Creatinine, Ser: 1.32 mg/dL — ABNORMAL HIGH (ref 0.44–1.00)
GFR calc Af Amer: 56 mL/min — ABNORMAL LOW (ref >60)
GFR calc non Af Amer: 48 mL/min — ABNORMAL LOW (ref >60)
Glucose, Bld: 126 mg/dL — ABNORMAL HIGH (ref 70–99)
Potassium: 3.5 mmol/L (ref 3.5–5.1)
Sodium: 133 mmol/L — ABNORMAL LOW (ref 135–145)
Total Bilirubin: 3.3 mg/dL — ABNORMAL HIGH (ref 0.3–1.2)
Total Protein: 5.3 g/dL — ABNORMAL LOW (ref 6.5–8.1)

## 2019-12-22 LAB — HEPATITIS B CORE ANTIBODY, TOTAL: Hep B Core Total Ab: NONREACTIVE

## 2019-12-22 LAB — IRON: Iron: 78 ug/dL (ref 28–170)

## 2019-12-22 LAB — PHOSPHORUS: Phosphorus: 3.6 mg/dL (ref 2.5–4.6)

## 2019-12-22 LAB — CBC
HCT: 37 % (ref 36.0–46.0)
Hemoglobin: 12.5 g/dL (ref 12.0–15.0)
MCH: 31.8 pg (ref 26.0–34.0)
MCHC: 33.8 g/dL (ref 30.0–36.0)
MCV: 94.1 fL (ref 80.0–100.0)
Platelets: 47 10*3/uL — ABNORMAL LOW (ref 150–400)
RBC: 3.93 MIL/uL (ref 3.87–5.11)
RDW: 14.4 % (ref 11.5–15.5)
WBC: 7.2 10*3/uL (ref 4.0–10.5)
nRBC: 0 % (ref 0.0–0.2)

## 2019-12-22 LAB — FERRITIN: Ferritin: 918 ng/mL — ABNORMAL HIGH (ref 11–307)

## 2019-12-22 LAB — HEPATITIS A ANTIBODY, TOTAL: hep A Total Ab: NONREACTIVE

## 2019-12-22 LAB — LACTIC ACID, PLASMA: Lactic Acid, Venous: 2.9 mmol/L (ref 0.5–1.9)

## 2019-12-22 LAB — HEPATITIS PANEL, ACUTE
HCV Ab: NONREACTIVE
Hep A IgM: NONREACTIVE
Hep B C IgM: NONREACTIVE
Hepatitis B Surface Ag: NONREACTIVE

## 2019-12-22 LAB — PROTIME-INR
INR: 1.3 — ABNORMAL HIGH (ref 0.8–1.2)
Prothrombin Time: 15.3 seconds — ABNORMAL HIGH (ref 11.4–15.2)

## 2019-12-22 LAB — HIV ANTIBODY (ROUTINE TESTING W REFLEX): HIV Screen 4th Generation wRfx: NONREACTIVE

## 2019-12-22 LAB — GAMMA GT: GGT: 531 U/L — ABNORMAL HIGH (ref 7–50)

## 2019-12-22 LAB — MAGNESIUM: Magnesium: 2.3 mg/dL (ref 1.7–2.4)

## 2019-12-22 LAB — AMMONIA: Ammonia: 77 umol/L — ABNORMAL HIGH (ref 9–35)

## 2019-12-22 MED ORDER — PNEUMOCOCCAL VAC POLYVALENT 25 MCG/0.5ML IJ INJ
0.5000 mL | INJECTION | INTRAMUSCULAR | Status: DC
  Filled 2019-12-22: qty 0.5

## 2019-12-22 MED ORDER — JUVEN PO PACK
1.0000 | PACK | Freq: Two times a day (BID) | ORAL | Status: DC
  Filled 2019-12-22 (×3): qty 1

## 2019-12-22 MED ORDER — COLLAGENASE 250 UNIT/GM EX OINT
TOPICAL_OINTMENT | Freq: Every day | CUTANEOUS | Status: DC
  Filled 2019-12-22 (×2): qty 30

## 2019-12-22 MED ORDER — ENSURE ENLIVE PO LIQD
237.0000 mL | Freq: Two times a day (BID) | ORAL | Status: DC
  Administered 2019-12-22: 237 mL via ORAL

## 2019-12-22 MED ORDER — LACTULOSE 10 GM/15ML PO SOLN
20.0000 g | Freq: Two times a day (BID) | ORAL | Status: DC
  Administered 2019-12-22 (×2): 20 g via ORAL
  Filled 2019-12-22 (×2): qty 30

## 2019-12-22 NOTE — Consult Note (Addendum)
Referring Provider: Dr. Aline August Primary Care Physician:  Patient, No Pcp Per Primary Gastroenterologist:  Mayo Ao  Reason for Consultation: Alcoholic hepatitis, alcoholic encephalopathy   HPI: Alyssa Carey is a 49 y.o. female past medical history of hypertension and alcohol abuse. She presented to [facility] emergency room on 12/21/2019 due to having generalized weakness, poor p.o. intake and hallucinations which  progressively worsened over the past week as reported by her son as documented by the ER physician.  No associated chest pain, shortness of breath, nausea, vomiting, diarrhea or abdominal pain per the ER physician's note.  She has a history of alcohol abuse, her last alcohol intake was consumed on 12/20/2019 as reported by her family.  Please note, there is no family at the bedside at this time to facilitate obtaining her history.  I attempted to call her son, Alyssa Carey, but I was unable to reach him at this time.   ED course: Sodium 131.  Potassium 4.4.  Glucose 110.  BUN 35.  Creatinine 1.74.  Calcium 8.2.  Anion gap 15.  Alk phos 242.  Albumin 1.8.  AST 199.  ALT 58.  Ammonia 76.  Total bili 3.5.  WBC 11.8.  Hemoglobin 14.5.  Hematocrit 45.0.  MCV 100.2.  Platelets 67.  Alcohol < 10.  Lactic acid 3.6.  Urine hemoglobin large.  Urine leukocytes trace.  Urine protein 100. Urine tox negative. HIV negative.   12/22/2019: Sodium 133.  Potassium 3.5.  Glucose 126.  BUN 39.  Creatinine 1.32.  Magnesium 2.3.  Alk phos 240.  Albumin 1.8.  AST 207.  ALT 58.  Ammonia 77.  Total bili 3.3.  WBC 7.2.  Hemoglobin 12.5.  Hematocrit 37.0.  MCV 94.1.  INR 1.3.MDF 12.5.  CT 12/21/2019: No acute intracranial process.  Abdominal imaging has not been done.  Currently, she is awake.  She is confused with rambling speech.  She is able to tell me her name.  She is disoriented to place and time.  She is calm in no distress.  She denies having any abdominal pain.  I spoke to her dayshift  RN Alyssa Carey.  The patient will require feeding assistance.  She is urinating yellow urine.  She is passed to to 3 loose stools this morning.  No rectal bleeding or reports of melena.   Prior to Admission medications   Medication Sig Start Date End Date Taking? Authorizing Provider  diphenhydramine-acetaminophen (TYLENOL PM) 25-500 MG TABS tablet Take 1 tablet by mouth at bedtime as needed (pain).   Yes [provider]  ibuprofen (ADVIL) 200 MG tablet Take 200 mg by mouth every 6 (six) hours as needed for moderate pain.   Yes [provider]    Current Facility-Administered Medications  Medication Dose Route Frequency Provider Last Rate Last Admin  . acetaminophen (TYLENOL) tablet 650 mg  650 mg Oral Q6H PRN Rise Patience, MD   650 mg at 12/21/19 2314  . collagenase (SANTYL) ointment   Topical Daily Alekh, Kshitiz, MD      . dextrose 5 %-0.9 % sodium chloride infusion   Intravenous Continuous Elwyn Reach, MD 100 mL/hr at 12/22/19 0818 New Bag at 12/22/19 0818  . feeding supplement (ENSURE ENLIVE) (ENSURE ENLIVE) liquid 237 mL  237 mL Oral BID BM Garba, Mohammad L, MD      . folic acid (FOLVITE) tablet 1 mg  1 mg Oral Daily Elwyn Reach, MD      . heparin injection  5,000 Units  5,000 Units Subcutaneous Q8H Elwyn Reach, MD   5,000 Units at 12/22/19 0556  . lactulose (CHRONULAC) 10 GM/15ML solution 20 g  20 g Oral BID Aline August, MD   20 g at 12/22/19 0834  . LORazepam (ATIVAN) injection 0-4 mg  0-4 mg Intravenous Q6H Elwyn Reach, MD       Followed by  . [START ON 12/24/2019] LORazepam (ATIVAN) injection 0-4 mg  0-4 mg Intravenous Q12H Gala Romney L, MD      . LORazepam (ATIVAN) tablet 1-4 mg  1-4 mg Oral Q1H PRN Elwyn Reach, MD   1 mg at 12/21/19 2335   Or  . LORazepam (ATIVAN) injection 1-4 mg  1-4 mg Intravenous Q1H PRN Elwyn Reach, MD      . multivitamin with minerals tablet 1 tablet  1 tablet Oral Daily Garba, Mohammad L, MD       . ondansetron (ZOFRAN) tablet 4 mg  4 mg Oral Q6H PRN Elwyn Reach, MD       Or  . ondansetron (ZOFRAN) injection 4 mg  4 mg Intravenous Q6H PRN Elwyn Reach, MD      . Derrill Memo ON 12/23/2019] pneumococcal 23 valent vaccine (PNEUMOVAX-23) injection 0.5 mL  0.5 mL Intramuscular Tomorrow-1000 Garba, Mohammad L, MD      . thiamine tablet 100 mg  100 mg Oral Daily Gala Romney L, MD       Or  . thiamine (B-1) injection 100 mg  100 mg Intravenous Daily Elwyn Reach, MD        Allergies as of 12/21/2019 - Review Complete 12/21/2019  Allergen Reaction Noted  . Penicillins  12/21/2019    History reviewed. No pertinent family history.  Social History   Socioeconomic History  . Marital status: Single    Spouse name: Not on file  . Number of children: Not on file  . Years of education: Not on file  . Highest education level: Not on file  Occupational History  . Not on file  Tobacco Use  . Smoking status: Current Some Day Smoker  Substance and Sexual Activity  . Alcohol use: Yes  . Drug use: Not Currently  . Sexual activity: Not Currently  Other Topics Concern  . Not on file  Social History Narrative  . Not on file   Social Determinants of Health   Financial Resource Strain:   . Difficulty of Paying Living Expenses:   Food Insecurity:   . Worried About Charity fundraiser in the Last Year:   . Arboriculturist in the Last Year:   Transportation Needs:   . Film/video editor (Medical):   Marland Kitchen Lack of Transportation (Non-Medical):   Physical Activity:   . Days of Exercise per Week:   . Minutes of Exercise per Session:   Stress:   . Feeling of Stress :   Social Connections:   . Frequency of Communication with Friends and Family:   . Frequency of Social Gatherings with Friends and Family:   . Attends Religious Services:   . Active Member of Clubs or Organizations:   . Attends Archivist Meetings:   Marland Kitchen Marital Status:   Intimate Partner Violence:     . Fear of Current or Ex-Partner:   . Emotionally Abused:   Marland Kitchen Physically Abused:   . Sexually Abused:     Review of Systems: Unable to obtain review of systems as the patient has encephalopathy.  Physical Exam: Vital signs in last 24 hours: Temp:  [97.7 F (36.5 C)-98.7 F (37.1 C)] 97.7 F (36.5 C) (06/07 0835) Pulse Rate:  [100-138] 117 (06/07 0835) Resp:  [18-43] 24 (06/07 0835) BP: (91-128)/(68-91) 116/79 (06/07 0835) SpO2:  [97 %-100 %] 97 % (06/07 0835) Last BM Date: 12/22/19 General: 49 year old female awake, conversant but confused.  In no acute distress. Head:  Normocephalic and atraumatic. Eyes:  No scleral icterus. Conjunctiva pink. Ears:  Normal auditory acuity. Nose:  No deformity, discharge or lesions. Mouth: White coating on tongue.  No ulcers or lesions.  Neck:  Supple. No lymphadenopathy or thyromegaly.  Lungs: Sounds clear throughout. Heart: Tachycardic, no murmurs. Abdomen: B's abdomen, questionable hepatomegaly.  No splenomegaly. Mild epigastric tenderness without rebound or guarding.  Positive bowel sounds to all 4 quadrants.  No mass. Rectal: Deferred. Musculoskeletal:  Symmetrical without gross deformities.  Pulses:  Normal pulses noted. Extremities:  Without clubbing or edema. Feet are red, brawny skin discoloration.  Neurologic:  Alert to person.  Oriented to place and time.  No asterixis.  Rambling but speech is clear.  Follows commands appropriately.  No asterixis. Skin:  Intact without significant lesions or rashes. Psych:  Alert and cooperative. Normal mood and affect.  Intake/Output from previous day: 06/06 0701 - 06/07 0700 In: 4375.3 [I.V.:311.7; IV Piggyback:4063.6] Out: -  Intake/Output this shift: No intake/output data recorded.  Lab Results: Recent Labs    12/21/19 1448 12/22/19 0136  WBC 11.8* 7.2  HGB 14.5 12.5  HCT 45.0 37.0  PLT 67* 47*   BMET Recent Labs    12/21/19 1658 12/22/19 0136  NA 131* 133*  K 4.4 3.5  CL  97* 102  CO2 19* 16*  GLUCOSE 110* 126*  BUN 35* 39*  CREATININE 1.74* 1.32*  CALCIUM 8.2* 7.9*   LFT Recent Labs    12/22/19 0136  PROT 5.3*  ALBUMIN 1.8*  AST 207*  ALT 58*  ALKPHOS 240*  BILITOT 3.3*   PT/INR Recent Labs    12/22/19 0136  LABPROT 15.3*  INR 1.3*   Hepatitis Panel No results for input(s): HEPBSAG, HCVAB, HEPAIGM, HEPBIGM in the last 72 hours.  Studies/Results: DG Sacrum/Coccyx  Result Date: 12/21/2019 CLINICAL DATA:  [place] last week due to weakness, continued sacrococcygeal pain EXAM: SACRUM AND COCCYX - 2+ VIEW COMPARISON:  None FINDINGS: Osseous demineralization. Narrowing of LEFT hip joint. SI joints and RIGHT hip joint space preserved. Sacral foramina symmetric. No definite sacrococcygeal fracture identified. IMPRESSION: No definite sacrococcygeal fracture identified. Degenerative changes LEFT hip joint. Electronically Signed   By: Lavonia Dana M.D.   On: 12/21/2019 18:10   CT Head Wo Contrast  Result Date: 12/21/2019 CLINICAL DATA:  Weakness. EXAM: CT HEAD WITHOUT CONTRAST TECHNIQUE: Contiguous axial images were obtained from the base of the skull through the vertex without intravenous contrast. COMPARISON:  None. FINDINGS: Brain: Ventricles and sulci are appropriate for patient's age. No evidence for acute cortically based infarct, intracranial hemorrhage, mass lesion or mass-effect. Vascular: Unremarkable Skull: Intact. Sinuses/Orbits: Paranasal sinuses well aerated. Mastoid air cells unremarkable. Orbits unremarkable. Other: None. IMPRESSION: No acute intracranial process. Electronically Signed   By: Lovey Newcomer M.D.   On: 12/21/2019 15:49    IMPRESSION/PLAN:  12.  49 year old female with acute alcoholic hepatitis with hepatic encephalopathy.  Alk phos 240.  AST:ALT ratio consistent with alcoholic liver disease. AST 207.  ALT 58.  Total bili 3.3.  Ammonia 77. MDF < 32 therefore she does meet criteria for  Prednisolone. Receiving Ativan per CIWA protocol.    -Abdominal sonogram  -Eventual abd/pelvic CT -IV fluid per the hospitalist -Continue Lactulose 20 g twice daily to titrate to 3-4 soft BMs daily -Continue CIWA  protocol  -Continue Folate and Thiamin  -Rule out UTI as contributing factor to encephalopathy. Urine culture.  -Monitor neuro status closely  -Acute hepatitis panel, Hep A total ab, Hep B core total, GGT, ANA, IgG, SMA, AMA, A1AT, ceruloplasmin, Iron, Ferritin.   2.  Generalized weakness history of recent falls  3.  Acute renal failure, improving. Cr 1.74 -> 1.32.   4.  Thrombocytopenia secondary to alcoholic hepatitis.  PLT 47.   Further recommendations per Dr. Nehemiah Settle Dorathy Daft  12/22/2019, 10:01 AM      Attending Physician Note   I have taken a history, examined the patient and reviewed the chart. I agree with the Advanced Practitioner's note, impression and recommendations.  Assessment: Alcoholic hepatitis with HE, DF <32 Alcoholism AKI  Recommendations:  As outlined above  Completely discontinue all alcohol use  Lucio Edward, MD [facility]

## 2019-12-22 NOTE — Plan of Care (Signed)
  Problem: Clinical Measurements: Goal: Will remain free from infection 12/22/2019 0102 by Mickie Kay, RN Outcome: Progressing 12/22/2019 0101 by Mickie Kay, RN Outcome: Progressing

## 2019-12-22 NOTE — Progress Notes (Signed)
Patient's admission skin assessment:  6 stage 2 wounds on left buttock ranging 0.5 cm to 3 cm, appeared to be blisters that may had ruptured, 1 tunnel wound 1x1x1 cm near the rectum, 2 open skin .5 x.5 cm in the labia, MASD throughout the whole perineum and an open slit  wound in the center fold of buttock. Cleaned and irrigated wound with lukewarm water. Foams applied . On cal MD notified, [place] ordered.

## 2019-12-22 NOTE — Progress Notes (Signed)
   12/22/19 0036  Assess: MEWS Score  Temp 98 F (36.7 C)  BP 106/77  Pulse Rate (!) 116  Resp (!) 28  Level of Consciousness Alert  SpO2 100 %  Assess: MEWS Score  MEWS Temp 0  MEWS Systolic 0  MEWS Pulse 2  MEWS RR 2  MEWS LOC 0  MEWS Score 4  MEWS Score Color Red  Assess: if the MEWS score is Yellow or Red  Were vital signs taken at a resting state? Yes  Focused Assessment Documented focused assessment  Early Detection of Sepsis Score *See Row Information* Medium  MEWS guidelines implemented *See Row Information* Yes  Treat  MEWS Interventions Escalated (See documentation below)  Take Vital Signs  Increase Vital Sign Frequency  Red: Q 1hr X 4 then Q 4hr X 4, if remains red, continue Q 4hrs  Escalate  MEWS: Escalate Red: discuss with charge nurse/RN and provider, consider discussing with RRT  Notify: Charge Nurse/RN  Name of Charge Nurse/RN Notified Helane Gunther  Date Charge Nurse/RN Notified 12/22/19  Time Charge Nurse/RN Notified 0041  Notify: Provider  Provider Name/Title Earlean Polka  Date Provider Notified 12/22/19  Time Provider Notified [phone]  Notification Type Page  Notification Reason Change in status  Response Other (Comment) (instruct to page Garba MD)  Date of Provider Response 12/22/19  Time of Provider Response [phone]

## 2019-12-22 NOTE — Progress Notes (Signed)
Initial Nutrition Assessment  INTERVENTION:   -Ensure Enlive po BID, each supplement provides 350 kcal and 20 grams of protein -Juven Fruit Punch BID, each serving provides 95kcal and 2.5g of protein (amino acids glutamine and arginine  NUTRITION DIAGNOSIS:   Increased nutrient needs related to chronic illness, wound healing(ETOH abuse) as evidenced by estimated needs.  GOAL:   Patient will meet greater than or equal to 90% of their needs  MONITOR:   PO intake, Supplement acceptance, Labs, Weight trends, I & O's, Skin  REASON FOR ASSESSMENT:   Malnutrition Screening Tool    ASSESSMENT:   49 y.o. female past medical history of hypertension and alcohol abuse. She presented to [facility] emergency room on 12/21/2019 due to having generalized weakness, poor p.o. intake and hallucinations which  progressively worsened over the past week as reported by her son as documented by the ER physician.  Admitted with hepatic encephalopathy.  Patient currently alert/oriented x 2, unable to provide much history. Pt with confusion. No family at bedside. Per chart review, pt has been having some depression since mother passed away in 28-Jul-2019. Per GI note, pt will require feeding assistance. Did not see any PO documented yet for this admission. Pt has been ordered ensure supplements. Given pressure injuries, will order Juven supplements as well.   Pt needs to have weight measured for admission, mental status may be a barrier to this currently. Per review of Care Everywhere, pt weighed 181 lbs on 3/16.   I/Os: +4.3L since admit  Medications: Folic acid, Multivitamin with minerals daily, Thiamine, D5 infusion Labs reviewed: Low Na Mg/Phos WNL  NUTRITION - FOCUSED PHYSICAL EXAM:  Unable to perform at this time.  Diet Order:   Diet Order            Diet regular Room service appropriate? Yes; Fluid consistency: Thin  Diet effective now              EDUCATION NEEDS:   No  education needs have been identified at this time  Skin:  Skin Assessment: Skin Integrity Issues: Skin Integrity Issues:: Stage II, Unstageable Stage II: left buttocks x 6 Unstageable: left buttocks  Last BM:  6/7 -type 7  Height:   Ht Readings from Last 1 Encounters:  No data found for Ht  Found per Care Everywhere: 5'4" (162.6 cm) 09/30/19  Weight:   Wt Readings from Last 1 Encounters:  No data found for Wt  Found per Care Everywhere: 181 lbs (82.4 kg) on 09/30/19  BMI:  There is no height or weight on file to calculate BMI.  Estimated Nutritional Needs:   Kcal:  1700-1900  Protein:  80-90g  Fluid:  1.9L/day  Clayton Bibles, MS, RD, LDN Inpatient Clinical Dietitian Contact information available via Amion

## 2019-12-22 NOTE — Consult Note (Signed)
[place] Nurse Consult Note: Reason for Consult: Consult requested for buttock wounds.   Wound type: Left buttock with several pressure injuries.  Generalized edema and erythremia. Gluteal fold with stage 2 pressure injury; 1X.3X.1cm, pink and moist Left buttock with 2 areas of unstageable pressure injuries; 7X3cm and 1X2cm, 100% eschar, All wounds with mod amt tan drainage and strong odor. Left lower buttock with 2 areas of stage 2 pressure injury; 1X.5X.1cm and .8X.5X.1cm, both pink and moist  Pressure Injury POA: Yes Dressing procedure/placement/frequency: Topical treatment orders provided for bedside nurses to perform daily as follows to provide enzymatic debridement: Apply Santyl to left buttock eschar areas Q day, then cover with moist gauze dressing and larger foam dressing, to cover other areas of stage 2 wounds.  (Change foam dressings Q 3 days or PRN)   Please re-consult if further assistance is needed.  Thank-you,  Julien Girt MSN, [employer], [place], [place], [place]

## 2019-12-22 NOTE — Progress Notes (Signed)
Patient is in the yellow mews. Notified charge nurse Harvest Dark, RN. Patient was previously in the red MEWS for HR and RR. Continued to monitor VS every 4 hours.   VS  T 98.2 BP 119/88 MAP 97 HR 118 RR 24 O2 100 % - RA

## 2019-12-22 NOTE — Progress Notes (Addendum)
Patient ID: Alyssa Carey, female   DOB: Jul 28, 1970, 49 y.o.   MRN: [id number]  PROGRESS NOTE    Alyssa Carey  RRN:[id number] DOB: 1970/08/19 DOA: 12/21/2019 PCP: Patient, No Pcp Per   Brief Narrative:  49 year old female with history of alcohol abuse presented with worsening generalized weakness and fall along with?  Visual hallucinations.  On presentation, she had elevated LFTs; urine drug screen was negative.  Patient was admitted for alcoholic hepatitis.  CT of the brain was negative for acute abnormality.  Assessment & Plan:   Acute alcoholic hepatitis with elevated LFTs Hepatic encephalopathy -Presented with progressively worsening generalized weakness and fall along with questionable visual hallucinations -Ammonia 76 on presentation with elevated AST and ALT along with bilirubin.  LFTs are still elevated.  Monitor. -Start lactulose. -GI consult: Follow recommendations -Urine tox screen was negative  Acute renal failure -Probably from poor oral intake and dehydration -Improving.  Creatinine 1.32 today.  Decrease IV fluids to 75 cc an hour  History of alcohol abuse -CIWA protocol.  Fall precautions.  Continue folic acid, thiamine and multivitamin -Social worker consult  Fall with weakness -From encephalopathy.  PT eval.  Fall precautions  Thrombocytopenia -Probably from alcohol abuse.  No signs of bleeding.  Monitor  Hypoalbuminemia -Probably from poor oral intake and alcohol abuse.  Nutrition consult.  Multiple stage II pressure ulcer on left buttock: Present on admission -Follow wound care recommendations  Pressure Injury 12/21/19 Buttocks Left Unstageable - Full thickness tissue loss in which the base of the injury is covered by slough (yellow, tan, gray, green or brown) and/or eschar (tan, brown or black) in the wound bed. 2 areas of unstageable on left  (Active)  12/21/19 2200  Location: Buttocks  Location Orientation: Left  Staging: Unstageable - Full thickness  tissue loss in which the base of the injury is covered by slough (yellow, tan, gray, green or brown) and/or eschar (tan, brown or black) in the wound bed.  Wound Description (Comments): 2 areas of unstageable on left buttock: 7X3cm and 1X2cm  Present on Admission: Yes     Pressure Injury 12/22/19 Buttocks Left Stage 2 -  Partial thickness loss of dermis presenting as a shallow open injury with a red, pink wound bed without slough. 3 areas of stage 2 to left buttocks; inner gluteal, left lower buttock in 2 locations (Active)  12/22/19   Location: Buttocks  Location Orientation: Left  Staging: Stage 2 -  Partial thickness loss of dermis presenting as a shallow open injury with a red, pink wound bed without slough.  Wound Description (Comments): 3 areas of stage 2 to left buttocks; inner gluteal, left lower buttock in 2 locations  Present on Admission: Yes        DVT prophylaxis: Start heparin due to thrombocytopenia.  SCDs Code Status: Full Family Communication: None at bedside.  Spoke to patient at bedside Disposition Plan: Status is: Inpatient  Remains inpatient appropriate because:Altered mental status.  Mental status improving but not back to her baseline.   Dispo: The patient is from: Home              Anticipated d/c is to: Home              Anticipated d/c date is: 2 days              Patient currently is not medically stable to d/c.  Consultants: GI  Procedures: None  Antimicrobials: None   Subjective: Patient seen and examined  at bedside.  She is awake, very slow to respond to questions and a poor historian but answers some questions.  Denies any current abdominal pain, nausea or vomiting.  No overnight fevers noted.  Objective: Vitals:   12/22/19 0230 12/22/19 0321 12/22/19 0436 12/22/19 0835  BP: 110/77 106/74 120/83 116/79  Pulse: (!) 115 (!) 113 (!) 110 (!) 117  Resp: (!) 26 (!) 28 (!) 28 (!) 24  Temp: 97.8 F (36.6 C) 98.2 F (36.8 C) 98.7 F (37.1 C)  97.7 F (36.5 C)  TempSrc: Oral Oral  Oral  SpO2: 100% 100% 100% 97%    Intake/Output Summary (Last 24 hours) at 12/22/2019 1018 Last data filed at 12/22/2019 0100 Gross per 24 hour  Intake 4375.32 ml  Output --  Net 4375.32 ml   There were no vitals filed for this visit.  Examination:  General exam: Appears calm and comfortable.  Looks chronically ill and older than stated age. Respiratory system: Bilateral decreased breath sounds at bases with some scattered crackles.  Tachypneic intermittently Cardiovascular system: S1 & S2 heard, tachycardic Gastrointestinal system: Abdomen is slightly distended, soft and nontender. Normal bowel sounds heard. Extremities: No cyanosis, clubbing, edema  Central nervous system: Awake, very slow to respond to questions.  Very poor historian.  No focal neurological deficits. Moving extremities Skin: No rashes, lesions or ulcers Psychiatry: Could not be assessed because of mental status    Data Reviewed: I have personally reviewed following labs and imaging studies  CBC: Recent Labs  Lab 12/21/19 1448 12/22/19 0136  WBC 11.8* 7.2  HGB 14.5 12.5  HCT 45.0 37.0  MCV 100.2* 94.1  PLT 67* 47*   Basic Metabolic Panel: Recent Labs  Lab 12/21/19 1658 12/22/19 0136  NA 131* 133*  K 4.4 3.5  CL 97* 102  CO2 19* 16*  GLUCOSE 110* 126*  BUN 35* 39*  CREATININE 1.74* 1.32*  CALCIUM 8.2* 7.9*  MG  --  2.3  PHOS  --  3.6   GFR: CrCl cannot be calculated (Unknown ideal weight.). Liver Function Tests: Recent Labs  Lab 12/21/19 1658 12/22/19 0136  AST 199* 207*  ALT 58* 58*  ALKPHOS 242* 240*  BILITOT 3.5* 3.3*  PROT 5.6* 5.3*  ALBUMIN 1.8* 1.8*   No results for input(s): LIPASE, AMYLASE in the last 168 hours. Recent Labs  Lab 12/21/19 1658 12/22/19 0136  AMMONIA 76* 77*   Coagulation Profile: Recent Labs  Lab 12/22/19 0136  INR 1.3*   Cardiac Enzymes: No results for input(s): CKTOTAL, CKMB, CKMBINDEX, TROPONINI in the  last 168 hours. BNP (last 3 results) No results for input(s): PROBNP in the last 8760 hours. HbA1C: No results for input(s): HGBA1C in the last 72 hours. CBG: Recent Labs  Lab 12/21/19 1521  GLUCAP 127*   Lipid Profile: No results for input(s): CHOL, HDL, LDLCALC, TRIG, CHOLHDL, LDLDIRECT in the last 72 hours. Thyroid Function Tests: No results for input(s): TSH, T4TOTAL, FREET4, T3FREE, THYROIDAB in the last 72 hours. Anemia Panel: No results for input(s): VITAMINB12, FOLATE, FERRITIN, TIBC, IRON, RETICCTPCT in the last 72 hours. Sepsis Labs: Recent Labs  Lab 12/21/19 1529 12/21/19 2231  LATICACIDVEN 3.6* 2.9*    Recent Results (from the past 240 hour(s))  SARS Coronavirus 2 by RT PCR (hospital order, performed in [facility] hospital lab) Nasopharyngeal Nasopharyngeal Swab     Status: None   Collection Time: 12/21/19  6:26 PM   Specimen: Nasopharyngeal Swab  Result Value Ref Range Status  SARS Coronavirus 2 NEGATIVE NEGATIVE Final    Comment: (NOTE) SARS-CoV-2 target nucleic acids are NOT DETECTED. The SARS-CoV-2 RNA is generally detectable in upper and lower respiratory specimens during the acute phase of infection. The lowest concentration of SARS-CoV-2 viral copies this assay can detect is 250 copies / mL. A negative result does not preclude SARS-CoV-2 infection and should not be used as the sole basis for treatment or other patient management decisions.  A negative result may occur with improper specimen collection / handling, submission of specimen other than nasopharyngeal swab, presence of viral mutation(s) within the areas targeted by this assay, and inadequate number of viral copies (<250 copies / mL). A negative result must be combined with clinical observations, patient history, and epidemiological information. Fact Sheet for Patients:   StrictlyIdeas.no Fact Sheet for Healthcare  Providers: BankingDealers.co.za This test is not yet approved or cleared  by the [place] FDA and has been authorized for detection and/or diagnosis of SARS-CoV-2 by FDA under an Emergency Use Authorization (EUA).  This EUA will remain in effect (meaning this test can be used) for the duration of the COVID-19 declaration under Section 564(b)(1) of the Act, 21 U.S.C. section 360bbb-3(b)(1), unless the authorization is terminated or revoked sooner. Performed at [facility], [place] [address] [place], [place] [address]          Radiology Studies: DG Sacrum/Coccyx  Result Date: 12/21/2019 CLINICAL DATA:  [place] last week due to weakness, continued sacrococcygeal pain EXAM: SACRUM AND COCCYX - 2+ VIEW COMPARISON:  None FINDINGS: Osseous demineralization. Narrowing of LEFT hip joint. SI joints and RIGHT hip joint space preserved. Sacral foramina symmetric. No definite sacrococcygeal fracture identified. IMPRESSION: No definite sacrococcygeal fracture identified. Degenerative changes LEFT hip joint. Electronically Signed   By: Lavonia Dana M.D.   On: 12/21/2019 18:10   CT Head Wo Contrast  Result Date: 12/21/2019 CLINICAL DATA:  Weakness. EXAM: CT HEAD WITHOUT CONTRAST TECHNIQUE: Contiguous axial images were obtained from the base of the skull through the vertex without intravenous contrast. COMPARISON:  None. FINDINGS: Brain: Ventricles and sulci are appropriate for patient's age. No evidence for acute cortically based infarct, intracranial hemorrhage, mass lesion or mass-effect. Vascular: Unremarkable Skull: Intact. Sinuses/Orbits: Paranasal sinuses well aerated. Mastoid air cells unremarkable. Orbits unremarkable. Other: None. IMPRESSION: No acute intracranial process. Electronically Signed   By: Lovey Newcomer M.D.   On: 12/21/2019 15:49        Scheduled Meds: . collagenase   Topical Daily  . feeding supplement (ENSURE ENLIVE)  237 mL Oral BID BM  .  folic acid  1 mg Oral Daily  . heparin  5,000 Units Subcutaneous Q8H  . lactulose  20 g Oral BID  . LORazepam  0-4 mg Intravenous Q6H   Followed by  . [START ON 12/24/2019] LORazepam  0-4 mg Intravenous Q12H  . multivitamin with minerals  1 tablet Oral Daily  . [START ON 12/23/2019] pneumococcal 23 valent vaccine  0.5 mL Intramuscular Tomorrow-1000  . thiamine  100 mg Oral Daily   Or  . thiamine  100 mg Intravenous Daily   Continuous Infusions: . dextrose 5 % and 0.9% NaCl 100 mL/hr at 12/22/19 0818          Arnie Clingenpeel Starla Link, MD [facility] 12/22/2019, 10:18 AM

## 2019-12-22 NOTE — Progress Notes (Signed)
PT Cancellation Note  Patient Details Name: Alyssa Carey MRN: [id number] DOB: 1971/01/02   Cancelled Treatment:    Reason Eval/Treat Not Completed: Fatigue/lethargy limiting ability to participate. Pt difficult to arouse, unable to follow commands appropriately, answers "04-Jan-1971" to all questions from therapist, and resting HR in supine 115bpm, SpO2 96% with labored breathing. Will reattempt evaluation tomorrow.    Talbot Grumbling PT, DPT 12/22/19, 2:23 PM

## 2019-12-23 ENCOUNTER — Encounter (HOSPITAL_COMMUNITY)
Admission: EM | Disposition: A | Discharge status: Skilled Nursing Facility | Payer: Self-pay | Source: Home / Self Care | Attending: Internal Medicine

## 2019-12-23 ENCOUNTER — Inpatient Hospital Stay (HOSPITAL_COMMUNITY): Payer: Medicaid Other

## 2019-12-23 ENCOUNTER — Inpatient Hospital Stay (HOSPITAL_COMMUNITY): Payer: Medicaid Other | Admitting: Anesthesiology

## 2019-12-23 ENCOUNTER — Encounter (HOSPITAL_COMMUNITY): Payer: Self-pay | Admitting: Internal Medicine

## 2019-12-23 DIAGNOSIS — D72829 Elevated white blood cell count, unspecified: Secondary | ICD-10-CM

## 2019-12-23 DIAGNOSIS — S31829A Unspecified open wound of left buttock, initial encounter: Secondary | ICD-10-CM

## 2019-12-23 DIAGNOSIS — K709 Alcoholic liver disease, unspecified: Secondary | ICD-10-CM

## 2019-12-23 DIAGNOSIS — R7989 Other specified abnormal findings of blood chemistry: Secondary | ICD-10-CM

## 2019-12-23 DIAGNOSIS — F1020 Alcohol dependence, uncomplicated: Secondary | ICD-10-CM

## 2019-12-23 DIAGNOSIS — K7290 Hepatic failure, unspecified without coma: Secondary | ICD-10-CM

## 2019-12-23 DIAGNOSIS — K7010 Alcoholic hepatitis without ascites: Secondary | ICD-10-CM

## 2019-12-23 DIAGNOSIS — N179 Acute kidney failure, unspecified: Secondary | ICD-10-CM

## 2019-12-23 HISTORY — PX: IRRIGATION AND DEBRIDEMENT ABSCESS: SHX5252

## 2019-12-23 LAB — COMPREHENSIVE METABOLIC PANEL
ALT: 68 U/L — ABNORMAL HIGH (ref 0–44)
ALT: 62 U/L — ABNORMAL HIGH (ref 0–44)
ALT: 51 U/L — ABNORMAL HIGH (ref 0–44)
AST: 159 U/L — ABNORMAL HIGH (ref 15–41)
AST: 123 U/L — ABNORMAL HIGH (ref 15–41)
AST: 81 U/L — ABNORMAL HIGH (ref 15–41)
Albumin: 1.6 g/dL — ABNORMAL LOW (ref 3.5–5.0)
Albumin: 1.5 g/dL — ABNORMAL LOW (ref 3.5–5.0)
Albumin: 1.4 g/dL — ABNORMAL LOW (ref 3.5–5.0)
Alkaline Phosphatase: 311 U/L — ABNORMAL HIGH (ref 38–126)
Alkaline Phosphatase: 318 U/L — ABNORMAL HIGH (ref 38–126)
Alkaline Phosphatase: 268 U/L — ABNORMAL HIGH (ref 38–126)
Anion gap: 13 (ref 5–15)
Anion gap: 12 (ref 5–15)
Anion gap: 10 (ref 5–15)
BUN: 33 mg/dL — ABNORMAL HIGH (ref 6–20)
BUN: 34 mg/dL — ABNORMAL HIGH (ref 6–20)
BUN: 27 mg/dL — ABNORMAL HIGH (ref 6–20)
CO2: 16 mmol/L — ABNORMAL LOW (ref 22–32)
CO2: 19 mmol/L — ABNORMAL LOW (ref 22–32)
CO2: 16 mmol/L — ABNORMAL LOW (ref 22–32)
Calcium: 8.1 mg/dL — ABNORMAL LOW (ref 8.9–10.3)
Calcium: 8.1 mg/dL — ABNORMAL LOW (ref 8.9–10.3)
Calcium: 7 mg/dL — ABNORMAL LOW (ref 8.9–10.3)
Chloride: 105 mmol/L (ref 98–111)
Chloride: 106 mmol/L (ref 98–111)
Chloride: 113 mmol/L — ABNORMAL HIGH (ref 98–111)
Creatinine, Ser: 0.84 mg/dL (ref 0.44–1.00)
Creatinine, Ser: 0.75 mg/dL (ref 0.44–1.00)
Creatinine, Ser: 0.59 mg/dL (ref 0.44–1.00)
GFR calc Af Amer: >60 mL/min (ref >60)
GFR calc Af Amer: >60 mL/min (ref >60)
GFR calc Af Amer: >60 mL/min (ref >60)
GFR calc non Af Amer: >60 mL/min (ref >60)
GFR calc non Af Amer: >60 mL/min (ref >60)
GFR calc non Af Amer: >60 mL/min (ref >60)
Glucose, Bld: 171 mg/dL — ABNORMAL HIGH (ref 70–99)
Glucose, Bld: 136 mg/dL — ABNORMAL HIGH (ref 70–99)
Glucose, Bld: 118 mg/dL — ABNORMAL HIGH (ref 70–99)
Potassium: 4 mmol/L (ref 3.5–5.1)
Potassium: 3.4 mmol/L — ABNORMAL LOW (ref 3.5–5.1)
Potassium: 2.4 mmol/L — CL (ref 3.5–5.1)
Sodium: 134 mmol/L — ABNORMAL LOW (ref 135–145)
Sodium: 137 mmol/L (ref 135–145)
Sodium: 139 mmol/L (ref 135–145)
Total Bilirubin: 3.4 mg/dL — ABNORMAL HIGH (ref 0.3–1.2)
Total Bilirubin: 2.8 mg/dL — ABNORMAL HIGH (ref 0.3–1.2)
Total Bilirubin: 2.3 mg/dL — ABNORMAL HIGH (ref 0.3–1.2)
Total Protein: 5.3 g/dL — ABNORMAL LOW (ref 6.5–8.1)
Total Protein: 5.1 g/dL — ABNORMAL LOW (ref 6.5–8.1)
Total Protein: 4.6 g/dL — ABNORMAL LOW (ref 6.5–8.1)

## 2019-12-23 LAB — POCT I-STAT 7, (LYTES, BLD GAS, ICA,H+H)
Acid-base deficit: 9 mmol/L — ABNORMAL HIGH (ref 0.0–2.0)
Bicarbonate: 17.5 mmol/L — ABNORMAL LOW (ref 20.0–28.0)
Calcium, Ion: 1.09 mmol/L — ABNORMAL LOW (ref 1.15–1.40)
HCT: 28 % — ABNORMAL LOW (ref 36.0–46.0)
Hemoglobin: 9.5 g/dL — ABNORMAL LOW (ref 12.0–15.0)
O2 Saturation: 99 %
Patient temperature: 35
Potassium: 2.3 mmol/L — CL (ref 3.5–5.1)
Sodium: 145 mmol/L (ref 135–145)
TCO2: 19 mmol/L — ABNORMAL LOW (ref 22–32)
pCO2 arterial: 35.7 mmHg (ref 32.0–48.0)
pH, Arterial: 7.289 — ABNORMAL LOW (ref 7.350–7.450)
pO2, Arterial: 165 mmHg — ABNORMAL HIGH (ref 83.0–108.0)

## 2019-12-23 LAB — CBC WITH DIFFERENTIAL/PLATELET
Abs Immature Granulocytes: 0.26 10*3/uL — ABNORMAL HIGH (ref 0.00–0.07)
Abs Immature Granulocytes: 0.37 10*3/uL — ABNORMAL HIGH (ref 0.00–0.07)
Basophils Absolute: 0 10*3/uL (ref 0.0–0.1)
Basophils Absolute: 0.1 10*3/uL (ref 0.0–0.1)
Basophils Relative: 0 %
Basophils Relative: 1 %
Eosinophils Absolute: 0.1 10*3/uL (ref 0.0–0.5)
Eosinophils Absolute: 0.1 10*3/uL (ref 0.0–0.5)
Eosinophils Relative: 0 %
Eosinophils Relative: 1 %
HCT: 37.2 % (ref 36.0–46.0)
HCT: 35.5 % — ABNORMAL LOW (ref 36.0–46.0)
Hemoglobin: 13.1 g/dL (ref 12.0–15.0)
Hemoglobin: 12.4 g/dL (ref 12.0–15.0)
Immature Granulocytes: 2 %
Immature Granulocytes: 2 %
Lymphocytes Relative: 5 %
Lymphocytes Relative: 7 %
Lymphs Abs: 0.9 10*3/uL (ref 0.7–4.0)
Lymphs Abs: 1 10*3/uL (ref 0.7–4.0)
MCH: 33 pg (ref 26.0–34.0)
MCH: 32.8 pg (ref 26.0–34.0)
MCHC: 35.2 g/dL (ref 30.0–36.0)
MCHC: 34.9 g/dL (ref 30.0–36.0)
MCV: 93.7 fL (ref 80.0–100.0)
MCV: 93.9 fL (ref 80.0–100.0)
Monocytes Absolute: 0.3 10*3/uL (ref 0.1–1.0)
Monocytes Absolute: 0.4 10*3/uL (ref 0.1–1.0)
Monocytes Relative: 2 %
Monocytes Relative: 2 %
Neutro Abs: 14.3 10*3/uL — ABNORMAL HIGH (ref 1.7–7.7)
Neutro Abs: 13.6 10*3/uL — ABNORMAL HIGH (ref 1.7–7.7)
Neutrophils Relative %: 91 %
Neutrophils Relative %: 87 %
Platelets: 31 10*3/uL — ABNORMAL LOW (ref 150–400)
Platelets: 32 10*3/uL — ABNORMAL LOW (ref 150–400)
RBC: 3.97 MIL/uL (ref 3.87–5.11)
RBC: 3.78 MIL/uL — ABNORMAL LOW (ref 3.87–5.11)
RDW: 14.7 % (ref 11.5–15.5)
RDW: 14.8 % (ref 11.5–15.5)
WBC: 15.8 10*3/uL — ABNORMAL HIGH (ref 4.0–10.5)
WBC: 15.6 10*3/uL — ABNORMAL HIGH (ref 4.0–10.5)
nRBC: 0 % (ref 0.0–0.2)
nRBC: 0.1 % (ref 0.0–0.2)

## 2019-12-23 LAB — PREPARE RBC (CROSSMATCH)

## 2019-12-23 LAB — LACTIC ACID, PLASMA
Lactic Acid, Venous: 3.3 mmol/L (ref 0.5–1.9)
Lactic Acid, Venous: 2.3 mmol/L (ref 0.5–1.9)

## 2019-12-23 LAB — URINALYSIS, ROUTINE W REFLEX MICROSCOPIC
Glucose, UA: NEGATIVE mg/dL
Ketones, ur: 5 mg/dL — AB
Leukocytes,Ua: NEGATIVE
Nitrite: NEGATIVE
Protein, ur: 30 mg/dL — AB
Specific Gravity, Urine: 1.023 (ref 1.005–1.030)
pH: 5 (ref 5.0–8.0)

## 2019-12-23 LAB — BLOOD GAS, ARTERIAL
Acid-base deficit: 9.3 mmol/L — ABNORMAL HIGH (ref 0.0–2.0)
Bicarbonate: 15 mmol/L — ABNORMAL LOW (ref 20.0–28.0)
FIO2: 60
O2 Saturation: 99.6 %
Patient temperature: 95
pCO2 arterial: 26.1 mmHg — ABNORMAL LOW (ref 32.0–48.0)
pH, Arterial: 7.366 (ref 7.350–7.450)
pO2, Arterial: 218 mmHg — ABNORMAL HIGH (ref 83.0–108.0)

## 2019-12-23 LAB — CBC
HCT: 30.4 % — ABNORMAL LOW (ref 36.0–46.0)
Hemoglobin: 10.8 g/dL — ABNORMAL LOW (ref 12.0–15.0)
MCH: 33.4 pg (ref 26.0–34.0)
MCHC: 35.5 g/dL (ref 30.0–36.0)
MCV: 94.1 fL (ref 80.0–100.0)
Platelets: 92 10*3/uL — ABNORMAL LOW (ref 150–400)
RBC: 3.23 MIL/uL — ABNORMAL LOW (ref 3.87–5.11)
RDW: 15 % (ref 11.5–15.5)
WBC: 16.9 10*3/uL — ABNORMAL HIGH (ref 4.0–10.5)
nRBC: 0 % (ref 0.0–0.2)

## 2019-12-23 LAB — MAGNESIUM: Magnesium: 2 mg/dL (ref 1.7–2.4)

## 2019-12-23 LAB — URINE CULTURE

## 2019-12-23 LAB — HCV RNA QUANT: HCV Quantitative: NOT DETECTED IU/mL (ref >50)

## 2019-12-23 LAB — MRSA PCR SCREENING: MRSA by PCR: NEGATIVE

## 2019-12-23 LAB — ABO/RH: ABO/RH(D): A POS

## 2019-12-23 LAB — HEPATITIS B SURFACE ANTIBODY, QUANTITATIVE: Hep B S AB Quant (Post): <3.1 m[IU]/mL — ABNORMAL LOW (ref >9.9)

## 2019-12-23 LAB — CERULOPLASMIN: Ceruloplasmin: 23.3 mg/dL (ref 19.0–39.0)

## 2019-12-23 LAB — IGG: IgG (Immunoglobin G), Serum: 941 mg/dL (ref 586–1602)

## 2019-12-23 LAB — PROTIME-INR
INR: 1.3 — ABNORMAL HIGH (ref 0.8–1.2)
INR: 1.4 — ABNORMAL HIGH (ref 0.8–1.2)
Prothrombin Time: 15.4 seconds — ABNORMAL HIGH (ref 11.4–15.2)
Prothrombin Time: 16.2 seconds — ABNORMAL HIGH (ref 11.4–15.2)

## 2019-12-23 LAB — ANA: Anti Nuclear Antibody (ANA): NEGATIVE

## 2019-12-23 LAB — GLUCOSE, CAPILLARY: Glucose-Capillary: 116 mg/dL — ABNORMAL HIGH (ref 70–99)

## 2019-12-23 LAB — ALPHA-1-ANTITRYPSIN: A-1 Antitrypsin, Ser: 328 mg/dL — ABNORMAL HIGH (ref 101–187)

## 2019-12-23 LAB — AMMONIA: Ammonia: 76 umol/L — ABNORMAL HIGH (ref 9–35)

## 2019-12-23 SURGERY — IRRIGATION AND DEBRIDEMENT ABSCESS
Anesthesia: General

## 2019-12-23 MED ORDER — SODIUM CHLORIDE 0.9 % IV SOLN: INTRAVENOUS | Status: DC | PRN

## 2019-12-23 MED ORDER — VANCOMYCIN HCL IN DEXTROSE 1-5 GM/200ML-% IV SOLN
1000.0000 mg | Freq: Three times a day (TID) | INTRAVENOUS | Status: DC
  Administered 2019-12-23 – 2019-12-26 (×9): 1000 mg via INTRAVENOUS
  Filled 2019-12-23 (×10): qty 200

## 2019-12-23 MED ORDER — PROPOFOL 1000 MG/100ML IV EMUL
0.0000 ug/kg/min | INTRAVENOUS | Status: DC
  Administered 2019-12-23: 10 ug/kg/min via INTRAVENOUS
  Administered 2019-12-24 (×2): 20 ug/kg/min via INTRAVENOUS
  Administered 2019-12-24: 30 ug/kg/min via INTRAVENOUS
  Administered 2019-12-25: 28.2 ug/kg/min via INTRAVENOUS
  Administered 2019-12-25: 30 ug/kg/min via INTRAVENOUS
  Administered 2019-12-26: 50 ug/kg/min via INTRAVENOUS
  Administered 2019-12-26: 20 ug/kg/min via INTRAVENOUS
  Filled 2019-12-23 (×9): qty 100

## 2019-12-23 MED ORDER — CLINDAMYCIN PHOSPHATE 900 MG/50ML IV SOLN
900.0000 mg | Freq: Three times a day (TID) | INTRAVENOUS | Status: DC
  Administered 2019-12-23 – 2019-12-31 (×25): 900 mg via INTRAVENOUS
  Filled 2019-12-23 (×26): qty 50

## 2019-12-23 MED ORDER — SODIUM CHLORIDE (PF) 0.9 % IJ SOLN
INTRAMUSCULAR | Status: AC
  Filled 2019-12-23: qty 50

## 2019-12-23 MED ORDER — FENTANYL BOLUS VIA INFUSION
50.0000 ug | INTRAVENOUS | Status: DC | PRN
  Filled 2019-12-23: qty 50

## 2019-12-23 MED ORDER — CHLORHEXIDINE GLUCONATE CLOTH 2 % EX PADS
6.0000 | MEDICATED_PAD | Freq: Every day | CUTANEOUS | Status: DC
  Administered 2019-12-23 – 2020-02-01 (×46): 6 via TOPICAL

## 2019-12-23 MED ORDER — FENTANYL CITRATE (PF) 250 MCG/5ML IJ SOLN
INTRAMUSCULAR | Status: DC | PRN
  Administered 2019-12-23 (×3): 50 ug via INTRAVENOUS

## 2019-12-23 MED ORDER — METRONIDAZOLE IN NACL 5-0.79 MG/ML-% IV SOLN
500.0000 mg | Freq: Three times a day (TID) | INTRAVENOUS | Status: DC
  Administered 2019-12-23: 500 mg via INTRAVENOUS
  Filled 2019-12-23: qty 100

## 2019-12-23 MED ORDER — ROCURONIUM BROMIDE 10 MG/ML (PF) SYRINGE
PREFILLED_SYRINGE | INTRAVENOUS | Status: DC | PRN
  Administered 2019-12-23: 30 mg via INTRAVENOUS
  Administered 2019-12-23: 50 mg via INTRAVENOUS

## 2019-12-23 MED ORDER — FENTANYL 2500MCG IN NS 250ML (10MCG/ML) PREMIX INFUSION
50.0000 ug/h | INTRAVENOUS | Status: DC
  Administered 2019-12-23 – 2019-12-26 (×2): 50 ug/h via INTRAVENOUS
  Filled 2019-12-23 (×2): qty 250

## 2019-12-23 MED ORDER — PROPOFOL 500 MG/50ML IV EMUL
INTRAVENOUS | Status: DC | PRN
  Administered 2019-12-23: 75 ug/kg/min via INTRAVENOUS

## 2019-12-23 MED ORDER — SODIUM CHLORIDE 0.9% IV SOLUTION: Freq: Once | INTRAVENOUS | Status: DC

## 2019-12-23 MED ORDER — ORAL CARE MOUTH RINSE
15.0000 mL | OROMUCOSAL | Status: DC
  Administered 2019-12-24 – 2020-01-07 (×127): 15 mL via OROMUCOSAL

## 2019-12-23 MED ORDER — IOHEXOL 300 MG/ML  SOLN
100.0000 mL | Freq: Once | INTRAMUSCULAR | Status: AC | PRN
  Administered 2019-12-23: 100 mL via INTRAVENOUS

## 2019-12-23 MED ORDER — VANCOMYCIN HCL 2000 MG/400ML IV SOLN
2000.0000 mg | INTRAVENOUS | Status: AC
  Administered 2019-12-23: 2000 mg via INTRAVENOUS
  Filled 2019-12-23: qty 400

## 2019-12-23 MED ORDER — PROPOFOL 10 MG/ML IV BOLUS
INTRAVENOUS | Status: DC | PRN
  Administered 2019-12-23: 50 mg via INTRAVENOUS

## 2019-12-23 MED ORDER — METOPROLOL TARTRATE 5 MG/5ML IV SOLN
2.5000 mg | Freq: Once | INTRAVENOUS | Status: AC
  Administered 2019-12-23: 2.5 mg via INTRAVENOUS
  Filled 2019-12-23: qty 5

## 2019-12-23 MED ORDER — MIDAZOLAM HCL 2 MG/2ML IJ SOLN
INTRAMUSCULAR | Status: AC
  Filled 2019-12-23: qty 2

## 2019-12-23 MED ORDER — FENTANYL CITRATE (PF) 100 MCG/2ML IJ SOLN: 50.0000 ug | Freq: Once | INTRAMUSCULAR | Status: DC

## 2019-12-23 MED ORDER — PHENYLEPHRINE HCL-NACL 10-0.9 MG/250ML-% IV SOLN
INTRAVENOUS | Status: AC
  Administered 2019-12-23: 70 ug/min via INTRAVENOUS
  Filled 2019-12-23: qty 250

## 2019-12-23 MED ORDER — POTASSIUM CHLORIDE 10 MEQ/50ML IV SOLN
10.0000 meq | INTRAVENOUS | Status: AC
  Administered 2019-12-23 – 2019-12-24 (×6): 10 meq via INTRAVENOUS
  Filled 2019-12-23 (×6): qty 50

## 2019-12-23 MED ORDER — FENTANYL CITRATE (PF) 250 MCG/5ML IJ SOLN
INTRAMUSCULAR | Status: AC
  Filled 2019-12-23: qty 5

## 2019-12-23 MED ORDER — MIDAZOLAM HCL 2 MG/2ML IJ SOLN
INTRAMUSCULAR | Status: DC | PRN
  Administered 2019-12-23: 2 mg via INTRAVENOUS

## 2019-12-23 MED ORDER — PHENYLEPHRINE HCL-NACL 10-0.9 MG/250ML-% IV SOLN
INTRAVENOUS | Status: DC | PRN
  Administered 2019-12-23: 50 ug/min via INTRAVENOUS

## 2019-12-23 MED ORDER — SODIUM CHLORIDE 0.9 % IV SOLN
2.0000 g | INTRAVENOUS | Status: DC
  Administered 2019-12-23 – 2020-01-05 (×15): 2 g via INTRAVENOUS
  Filled 2019-12-23 (×2): qty 20
  Filled 2019-12-23 (×5): qty 2
  Filled 2019-12-23: qty 20
  Filled 2019-12-23 (×4): qty 2
  Filled 2019-12-23 (×2): qty 20
  Filled 2019-12-23: qty 2

## 2019-12-23 MED ORDER — PHENYLEPHRINE HCL-NACL 10-0.9 MG/250ML-% IV SOLN
0.0000 ug/min | INTRAVENOUS | Status: DC
  Administered 2019-12-24: 80 ug/min via INTRAVENOUS
  Filled 2019-12-23: qty 250

## 2019-12-23 MED ORDER — LIDOCAINE 2% (20 MG/ML) 5 ML SYRINGE
INTRAMUSCULAR | Status: DC | PRN
  Administered 2019-12-23: 60 mg via INTRAVENOUS

## 2019-12-23 MED ORDER — PROPOFOL 500 MG/50ML IV EMUL
INTRAVENOUS | Status: AC
  Filled 2019-12-23: qty 50

## 2019-12-23 MED ORDER — CHLORHEXIDINE GLUCONATE 0.12% ORAL RINSE (MEDLINE KIT)
15.0000 mL | Freq: Two times a day (BID) | OROMUCOSAL | Status: DC
  Administered 2019-12-23 – 2020-01-07 (×27): 15 mL via OROMUCOSAL

## 2019-12-23 MED ORDER — SODIUM CHLORIDE 0.9 % IV BOLUS
500.0000 mL | Freq: Once | INTRAVENOUS | Status: AC
  Administered 2019-12-23: 500 mL via INTRAVENOUS

## 2019-12-23 MED ORDER — PHENYLEPHRINE 40 MCG/ML (10ML) SYRINGE FOR IV PUSH (FOR BLOOD PRESSURE SUPPORT)
PREFILLED_SYRINGE | INTRAVENOUS | Status: DC | PRN
  Administered 2019-12-23: 120 ug via INTRAVENOUS

## 2019-12-23 SURGICAL SUPPLY — 16 items
BNDG GAUZE ELAST 4 BULKY (GAUZE/BANDAGES/DRESSINGS) ×6 IMPLANT
BRIEF STRETCH FOR OB PAD LRG (UNDERPADS AND DIAPERS) ×2 IMPLANT
COVER SURGICAL LIGHT HANDLE (MISCELLANEOUS) ×2 IMPLANT
DRAPE LAPAROSCOPIC ABDOMINAL (DRAPES) ×2 IMPLANT
DRSG PAD ABDOMINAL 8X10 ST (GAUZE/BANDAGES/DRESSINGS) ×6 IMPLANT
ELECT REM PT RETURN 15FT ADLT (MISCELLANEOUS) ×2 IMPLANT
GLOVE BIO SURGEON STRL SZ7.5 (GLOVE) ×24 IMPLANT
GOWN STRL REUS W/TWL XL LVL3 (GOWN DISPOSABLE) ×2 IMPLANT
KIT BASIN (CUSTOM PROCEDURE TRAY) ×2 IMPLANT
KIT TURNOVER KIT A (KITS) ×2 IMPLANT
PACK GENERAL/GYN (CUSTOM PROCEDURE TRAY) ×2 IMPLANT
PENCIL SMOKE EVACUATOR (MISCELLANEOUS) ×2 IMPLANT
SPONGE LAP 18X18 RF (DISPOSABLE) ×4 IMPLANT
SWAB COLLECTION DEVICE MRSA (MISCELLANEOUS) ×2 IMPLANT
SWAB CULTURE ESWAB REG 1ML (MISCELLANEOUS) ×2 IMPLANT
TOWEL OR 17X26 10 PK STRL BLUE (TOWEL DISPOSABLE) ×2 IMPLANT

## 2019-12-23 NOTE — Progress Notes (Signed)
Called report to ICU nurse.

## 2019-12-23 NOTE — TOC Initial Note (Signed)
Transition of Care [facility]) - Initial/Assessment Note    Patient Details  Name: Alyssa Carey MRN: [id number] Date of Birth: 1971-01-03  [facility] CM/SW Contact:    Trish Mage, LCSW Phone Number: 12/23/2019, 11:02 AM  Clinical Narrative:   Went ot see patient in follow up to MD consult for substance abuse.  Rapid response had been called.  Returned later to find patient had gone for CT scan.  Will follow up later today if time allows. TOC will continue to follow during the course of hospitalization.                 Expected Discharge Plan: Home/Self Care Barriers to Discharge: No Barriers Identified   Patient Goals and CMS Choice        Expected Discharge Plan and Services Expected Discharge Plan: Home/Self Care                                              Prior Living Arrangements/Services                       Activities of Daily Living Home Assistive Devices/Equipment: Environmental consultant (specify type), Cane (specify quad or straight) ADL Screening (condition at time of admission) Patient's cognitive ability adequate to safely complete daily activities?: Yes Is the patient deaf or have difficulty hearing?: No Does the patient have difficulty seeing, even when wearing glasses/contacts?: No Does the patient have difficulty concentrating, remembering, or making decisions?: Yes Patient able to express need for assistance with ADLs?: Yes Does the patient have difficulty dressing or bathing?: Yes Independently performs ADLs?: No Communication: Independent Dressing (OT): Needs assistance Is this a change from baseline?: Change from baseline, expected to last >3 days Grooming: Needs assistance Is this a change from baseline?: Change from baseline, expected to last >3 days Feeding: Independent Bathing: Needs assistance Is this a change from baseline?: Change from baseline, expected to last >3 days Toileting: Needs assistance Is this a change from  baseline?: Change from baseline, expected to last >3days In/Out Bed: Needs assistance Is this a change from baseline?: Change from baseline, expected to last >3 days Walks in Home: Needs assistance Is this a change from baseline?: Change from baseline, expected to last >3 days Does the patient have difficulty walking or climbing stairs?: Yes Weakness of Legs: Both Weakness of Arms/Hands: Both  Permission Sought/Granted                  Emotional Assessment              Admission diagnosis:  Hepatic encephalopathy ([place]) [G31.51] Alcoholic liver disease ([place]) [K70.9] Patient Active Problem List   Diagnosis Date Noted  . Pressure injury of skin 12/22/2019  . Fall at home, initial encounter 12/21/2019  . Weakness 12/21/2019  . Alcoholism ([place]) 12/21/2019  . Thrombocytopenia ([place]) 12/21/2019  . ARF (acute renal failure) ([place]) 12/21/2019  . Hepatic encephalopathy ([place]) 12/21/2019  . Acute alcoholic hepatitis 76/16/0737   PCP:  Patient, No Pcp Per Pharmacy:   [facility] [place], [place] [address] Phone: [phone] Fax: [phone]     Social Determinants of Health (SDOH) Interventions    Readmission Risk Interventions No flowsheet data found.

## 2019-12-23 NOTE — Progress Notes (Signed)
PT Cancellation Note  Patient Details Name: Alyssa Carey MRN: [id number] DOB: April 20, 1971   Cancelled Treatment:    Reason Eval/Treat Not Completed: Medical issues which prohibited therapy, moved to SDU .    Claretha Cooper 12/23/2019, 1:57 PM [place] Pager [phone] Office [phone]

## 2019-12-23 NOTE — Anesthesia Postprocedure Evaluation (Signed)
Anesthesia Post Note  Patient: SWAYZEE WADLEY  Procedure(s) Performed: EXCISION AND DEBRIDEMENT LEFT BUTTOCK AND PERINEUM (N/A )     Patient location during evaluation: ICU Anesthesia Type: General Level of consciousness: sedated Pain management: pain level controlled Vital Signs Assessment: post-procedure vital signs reviewed and stable Respiratory status: patient remains intubated per anesthesia plan Cardiovascular status: stable Postop Assessment: no apparent nausea or vomiting Anesthetic complications: no    Last Vitals:  Vitals:   12/23/19 1800 12/23/19 1805  BP: (!) 87/67 98/69  Pulse: (!) 112 (!) 115  Resp: (!) 25 (!) 31  Temp:    SpO2: 95% 100%    Last Pain:  Vitals:   12/23/19 1732  TempSrc: Oral  PainSc:                  Audry Pili

## 2019-12-23 NOTE — Progress Notes (Signed)
Patient taken down to short stay.

## 2019-12-23 NOTE — Anesthesia Procedure Notes (Signed)
Central Venous Catheter Insertion Performed by: Audry Pili, MD, anesthesiologist Start/End6/02/2020 6:40 PM, 12/23/2019 6:47 PM Patient location: Pre-op. Preanesthetic checklist: patient identified, IV checked, risks and benefits discussed, surgical consent, monitors and equipment checked, pre-op evaluation, timeout performed and anesthesia consent Position: supine Patient sedated Hand hygiene performed , maximum sterile barriers used  and Seldinger technique used Catheter size: 8 Fr Central line was placed.Double lumen Procedure performed using ultrasound guided technique. Ultrasound Notes:anatomy identified, needle tip was noted to be adjacent to the nerve/plexus identified, no ultrasound evidence of intravascular and/or intraneural injection and image(s) printed for medical record Attempts: 1 Following insertion, line sutured, dressing applied and Biopatch. Post procedure assessment: blood return through all ports, free fluid flow and no air  Patient tolerated the procedure well with no immediate complications.

## 2019-12-23 NOTE — Anesthesia Procedure Notes (Signed)
Procedure Name: Intubation Date/Time: 12/23/2019 6:25 PM Performed by: Cynda Familia, CRNA Pre-anesthesia Checklist: Patient identified, Emergency Drugs available, Suction available and Patient being monitored Patient Re-evaluated:Patient Re-evaluated prior to induction Oxygen Delivery Method: Circle System Utilized Preoxygenation: Pre-oxygenation with 100% oxygen Induction Type: IV induction Ventilation: Mask ventilation without difficulty Laryngoscope Size: Miller and 2 Grade View: Grade I Tube type: Oral Number of attempts: 1 Airway Equipment and Method: Stylet and Oral airway Placement Confirmation: ETT inserted through vocal cords under direct vision,  positive ETCO2 and breath sounds checked- equal and bilateral Secured at: 21 cm Tube secured with: Tape Dental Injury: Teeth and Oropharynx as per pre-operative assessment  Comments: Smooth Iv induction Brock--intubation AM CRNA atraumatic -- teeth and mouth as preop -bilat Bs Fransisco Beau

## 2019-12-23 NOTE — Consult Note (Addendum)
WOC Nurse Consult Note: WOC consult was performed yesterday for left buttock; requested today for a tunneling wound near rectum on right buttock. Pt is demonstrating S/S of sepsis and I suspect this wound is the source.  Appearance and location are consistent with possible necrotizing fascitis.  Rapid response team was present in the room at this time and they are sending a secure chat message to the primary team of my findings and need for CT scan for a definitive diagnosis. Pt will need a surgical consult if an extensive abscess is indicated.  Reason for Consult: Right buttock with full thickness wound, located near rectum; .3X.3X6cm, large amt very foul-smelling tan drainage, wound appears to be 100% brown inside the narrow tunneling site. Left buttock with 2 areas of unstageable pressure injuries have declined since assessment yesterday and are also more necrotic.  Suspect the left buttock wounds communicate with the right buttocks below the skin level. It will be difficult to keep the affected dressings and wounds from becoming soiled, since pt is frequently incontinent of loose stools.   Dressing procedure/placement/frequency: Topical treatment orders provided for bedside nurses to perform daily until further recommendations are available form the surgical team; Apply moist gauze packing to right buttock wound Q day, using swab to fill, then cover with foam dressing.   Please re-consult if further assistance is needed.  Thank-you,  Julien Girt MSN, [place], [place], [place], [place]

## 2019-12-23 NOTE — Consult Note (Addendum)
[facility] Surgery Consult Note  Alyssa Carey 1970-08-14  [id number].    Requesting MD: Starla Link, MD Chief Complaint/Reason for Consult: decubitus ulcer, SIRS  HPI:  Alyssa Carey is a 49 y/o F with a PMH alcohol abuse, recent falls, and HTN who was brought to the ED 12/22/19 by her son for weakness and poor oral intake. History obtained per patients son as patient is confused and somnolent during my exam. Per the patients son, the patient was acting more confused beginning on Saturday and progressing into Sunday - he states she was having hallucinations and seeing things that were not there. He reports that she has been drinking daily for many years but even more heavily in the last year since her mother and another family member have passed away. He states she drinks at least 3, 40 oz beers daily. He denies knowing about any wounds on her buttock - but states that she mentioned she may have hit buttock when she had a fall on Thursday (4-5 days ago). Per chart review, the patient also reported  chest pain, nausea, vomiting, and abdominal pain in the ED. She was admitted by the medical team for management of alcoholic hepatitis with hepatic encephalopathy.    ED course: BUN 35/Creatinine 1.74.  Anion gap 15.  Alk phos 242, Albumin 1.8, AST 199, ALT 58, Ammonia 76,  Total bili 3.5. WBC 11.8, Hemoglobin 14.5, Hematocrit 45.0, MCV 100.2,  Platelets 67. Alcohol level < 10.  Lactic acid 3.6.  UA with some blood and trace leukocytes. Urine tox negative. HIV negative.  On hospital day #1 the patients AMS worsened, she developed tachycardia, and her WBC increased to 15,000. Stage II pressure ulcers were noted on the patients buttock and WOC RN was consulted - the [place] RN expressed concern for infectious process related to the pressure ulcer and general surgery has been consulted to  Evaluate for possible skin and soft tissue infection.  Per her son, at baseline the patient mobilizes independently and  drives a car. She is currently unemployed  ROS: Review of Systems  Unable to perform ROS: Mental status change    History reviewed. No pertinent family history.  History reviewed. No pertinent past medical history.  History reviewed. No pertinent surgical history.  Social History:  reports that she has been smoking. She does not have any smokeless tobacco history on file. She reports current alcohol use. She reports previous drug use.  Allergies:  Allergies  Allergen Reactions  . Penicillins     Did it involve swelling of the face/tongue/throat, SOB, or low BP? N Did it involve sudden or severe rash/hives, skin peeling, or any reaction on the inside of your mouth or nose? Y Did you need to seek medical attention at a hospital or doctor's office? N When did it last happen?Childhood If all above answers are "NO", may proceed with cephalosporin use.     Medications Prior to Admission  Medication Sig Dispense Refill  . diphenhydramine-acetaminophen (TYLENOL PM) 25-500 MG TABS tablet Take 1 tablet by mouth at bedtime as needed (pain).    Marland Kitchen ibuprofen (ADVIL) 200 MG tablet Take 200 mg by mouth every 6 (six) hours as needed for moderate pain.      Blood pressure 103/72, pulse (!) 119, temperature 97.8 F (36.6 C), resp. rate (!) 34, height _0  (1.626 m), weight 97 kg, last menstrual period 03/23/2019, SpO2 100 %. Physical Exam: Constitutional: somnolent but arouses to loud voice, tachypnea, no deformities Eyes:  Moist conjunctiva;  anicteric; PERRL Neck: Trachea midline; no thyromegaly Lungs: Normal respiratory effort; no tactile fremitus CV: RRR; no palpable thrills; no pitting edema GI: Abd soft, non-tender, no palpable hepatosplenomegaly GU: necrotic wound over left buttock as below with surrounding cellulitis and overlying warmth, wound is spontaneously draining near in the anus in the 10 o'clock position. On DRE the wound communicates with the anus.    MSK:  symmetrical, no clubbing/cyanosis Neuro: somnolent, arouses to loud voice and noxious stimuli, no consistently  Following commands Lymphatic: No palpable cervical or axillary lymphadenopathy  Results for orders placed or performed during the hospital encounter of 12/21/19 (from the past 48 hour(s))  CBC     Status: Abnormal   Collection Time: 12/21/19  2:48 PM  Result Value Ref Range   WBC 11.8 (H) 4.0 - 10.5 K/uL   RBC 4.49 3.87 - 5.11 MIL/uL   Hemoglobin 14.5 12.0 - 15.0 g/dL   HCT 45.0 36.0 - 46.0 %   MCV 100.2 (H) 80.0 - 100.0 fL   MCH 32.3 26.0 - 34.0 pg   MCHC 32.2 30.0 - 36.0 g/dL   RDW 14.9 11.5 - 15.5 %   Platelets 67 (L) 150 - 400 K/uL    Comment: SPECIMEN CHECKED FOR CLOTS Immature Platelet Fraction may be clinically indicated, consider ordering this additional test [id number] PLATELET COUNT CONFIRMED BY SMEAR REPEATED TO VERIFY    nRBC 0.0 0.0 - 0.2 %    Comment: Performed at [facility], [place] [address] [place], [place] [address]  Ethanol     Status: None   Collection Time: 12/21/19  2:50 PM  Result Value Ref Range   Alcohol, Ethyl (B) <10 <10 mg/dL    Comment: (NOTE) Lowest detectable limit for serum alcohol is 10 mg/dL. For medical purposes only. Performed at [facility], [place] [address] [place], [place] [address]   Salicylate level     Status: Abnormal   Collection Time: 12/21/19  2:50 PM  Result Value Ref Range   Salicylate Lvl <7.2 (L) 7.0 - 30.0 mg/dL    Comment: Performed at [facility], [place] [address] [place], [place] [address]  Acetaminophen level     Status: Abnormal   Collection Time: 12/21/19  2:50 PM  Result Value Ref Range   Acetaminophen (Tylenol), Serum <10 (L) 10 - 30 ug/mL    Comment: (NOTE) Therapeutic concentrations vary significantly. A range of 10-30 ug/mL  may be an effective concentration for many patients. However, some  are best treated at concentrations outside of this  range. Acetaminophen concentrations >150 ug/mL at 4 hours after ingestion  and >50 ug/mL at 12 hours after ingestion are often associated with  toxic reactions. Performed at [facility], [place] [address] [place],  [address]   hCG, quantitative, pregnancy     Status: None   Collection Time: 12/21/19  3:20 PM  Result Value Ref Range   hCG, Beta Chain, Quant, S 1 <5 mIU/mL    Comment:          GEST. AGE      CONC.  (mIU/mL)   <=1 WEEK        5 - 50     2 WEEKS       50 - 500     3 WEEKS       100 - 10,000     4 WEEKS     1,000 - 30,000  5 WEEKS     3,500 - 115,000   6-8 WEEKS     12,000 - 270,000    12 WEEKS     15,000 - 220,000        FEMALE AND NON-PREGNANT FEMALE:     LESS THAN 5 mIU/mL Performed at [facility], [place] [address], [place], [place] [address]   CBG monitoring, ED     Status: Abnormal   Collection Time: 12/21/19  3:21 PM  Result Value Ref Range   Glucose-Capillary 127 (H) 70 - 99 mg/dL    Comment: Glucose reference range applies only to samples taken after fasting for at least 8 hours.  Lactic acid, plasma     Status: Abnormal   Collection Time: 12/21/19  3:29 PM  Result Value Ref Range   Lactic Acid, Venous 3.6 (HH) 0.5 - 1.9 mmol/L    Comment: CRITICAL RESULT CALLED TO, READ BACK BY AND VERIFIED WITH: ZULETTA,K RN _0  ON 12/21/19 JACKSON,K Performed at [facility], [place] [address] [place], [place] [address]   Ammonia     Status: Abnormal   Collection Time: 12/21/19  4:58 PM  Result Value Ref Range   Ammonia 76 (H) 9 - 35 umol/L    Comment: Performed at [facility], [place] [address] [place], [place] [address]  Comprehensive metabolic panel     Status: Abnormal   Collection Time: 12/21/19  4:58 PM  Result Value Ref Range   Sodium 131 (L) 135 - 145 mmol/L   Potassium 4.4 3.5 - 5.1 mmol/L   Chloride 97 (L) 98 - 111 mmol/L   CO2 19 (L) 22 - 32 mmol/L   Glucose, Bld 110 (H)  70 - 99 mg/dL    Comment: Glucose reference range applies only to samples taken after fasting for at least 8 hours.   BUN 35 (H) 6 - 20 mg/dL   Creatinine, Ser 1.74 (H) 0.44 - 1.00 mg/dL   Calcium 8.2 (L) 8.9 - 10.3 mg/dL   Total Protein 5.6 (L) 6.5 - 8.1 g/dL   Albumin 1.8 (L) 3.5 - 5.0 g/dL   AST 199 (H) 15 - 41 U/L   ALT 58 (H) 0 - 44 U/L   Alkaline Phosphatase 242 (H) 38 - 126 U/L   Total Bilirubin 3.5 (H) 0.3 - 1.2 mg/dL   GFR calc non Af Amer 35 (L) >60 mL/min   GFR calc Af Amer 40 (L) >60 mL/min   Anion gap 15 5 - 15    Comment: Performed at [facility], [place] [address], [place],  [address]  Rapid urine drug screen (hospital performed)     Status: None   Collection Time: 12/21/19  6:20 PM  Result Value Ref Range   Opiates NONE DETECTED NONE DETECTED   Cocaine NONE DETECTED NONE DETECTED   Benzodiazepines NONE DETECTED NONE DETECTED   Amphetamines NONE DETECTED NONE DETECTED   Tetrahydrocannabinol NONE DETECTED NONE DETECTED   Barbiturates NONE DETECTED NONE DETECTED    Comment: (NOTE) DRUG SCREEN FOR MEDICAL PURPOSES ONLY.  IF CONFIRMATION IS NEEDED FOR ANY PURPOSE, NOTIFY LAB WITHIN 5 DAYS. LOWEST DETECTABLE LIMITS FOR URINE DRUG SCREEN Drug Class                     Cutoff (ng/mL) Amphetamine and metabolites    1000 Barbiturate and metabolites    200 Benzodiazepine  258 Tricyclics and metabolites     300 Opiates and metabolites        300 Cocaine and metabolites        300 THC                            50 Performed at [facility], [place] [address], [place], [place] [address]   Urinalysis, Routine w reflex microscopic     Status: Abnormal   Collection Time: 12/21/19  6:20 PM  Result Value Ref Range   Color, Urine AMBER (A) YELLOW    Comment: BIOCHEMICALS MAY BE AFFECTED BY COLOR   APPearance CLOUDY (A) CLEAR   Specific Gravity, Urine 1.024 1.005 - 1.030   pH 5.0 5.0 - 8.0   Glucose, UA NEGATIVE  NEGATIVE mg/dL   Hgb urine dipstick LARGE (A) NEGATIVE   Bilirubin Urine SMALL (A) NEGATIVE   Ketones, ur 5 (A) NEGATIVE mg/dL   Protein, ur 100 (A) NEGATIVE mg/dL   Nitrite NEGATIVE NEGATIVE   Leukocytes,Ua TRACE (A) NEGATIVE   RBC / HPF 0-5 0 - 5 RBC/hpf   WBC, UA 6-10 0 - 5 WBC/hpf   Bacteria, UA RARE (A) NONE SEEN   Squamous Epithelial / LPF 0-5 0 - 5   Mucus PRESENT    Hyaline Casts, UA PRESENT    Crystals PRESENT (A) NEGATIVE    Comment: Performed at [facility], [place] [address], [place], [place] [address]  SARS Coronavirus 2 by RT PCR (hospital order, performed in [facility] hospital lab) Nasopharyngeal Nasopharyngeal Swab     Status: None   Collection Time: 12/21/19  6:26 PM   Specimen: Nasopharyngeal Swab  Result Value Ref Range   SARS Coronavirus 2 NEGATIVE NEGATIVE    Comment: (NOTE) SARS-CoV-2 target nucleic acids are NOT DETECTED. The SARS-CoV-2 RNA is generally detectable in upper and lower respiratory specimens during the acute phase of infection. The lowest concentration of SARS-CoV-2 viral copies this assay can detect is 250 copies / mL. A negative result does not preclude SARS-CoV-2 infection and should not be used as the sole basis for treatment or other patient management decisions.  A negative result may occur with improper specimen collection / handling, submission of specimen other than nasopharyngeal swab, presence of viral mutation(s) within the areas targeted by this assay, and inadequate number of viral copies (<250 copies / mL). A negative result must be combined with clinical observations, patient history, and epidemiological information. Fact Sheet for Patients:   StrictlyIdeas.no Fact Sheet for Healthcare Providers: BankingDealers.co.za This test is not yet approved or cleared  by the [place] FDA and has been authorized for detection and/or diagnosis of SARS-CoV-2 by FDA under  an Emergency Use Authorization (EUA).  This EUA will remain in effect (meaning this test can be used) for the duration of the COVID-19 declaration under Section 564(b)(1) of the Act, 21 U.S.C. section 360bbb-3(b)(1), unless the authorization is terminated or revoked sooner. Performed at [facility], [place] [address], [place], [place] [address]   Lactic acid, plasma     Status: Abnormal   Collection Time: 12/21/19 10:31 PM  Result Value Ref Range   Lactic Acid, Venous 2.9 (HH) 0.5 - 1.9 mmol/L    Comment: CRITICAL VALUE NOTED.  VALUE IS CONSISTENT WITH PREVIOUSLY REPORTED AND CALLED VALUE. Performed at [facility], [place] [address], [place], [place] [address]   HIV Antibody (routine testing  w rflx)     Status: None   Collection Time: 12/22/19  1:36 AM  Result Value Ref Range   HIV Screen 4th Generation wRfx Non Reactive Non Reactive    Comment: Performed at [facility], [place] [address], [place], [place] [address]  CBC     Status: Abnormal   Collection Time: 12/22/19  1:36 AM  Result Value Ref Range   WBC 7.2 4.0 - 10.5 K/uL   RBC 3.93 3.87 - 5.11 MIL/uL   Hemoglobin 12.5 12.0 - 15.0 g/dL   HCT 37.0 36.0 - 46.0 %   MCV 94.1 80.0 - 100.0 fL   MCH 31.8 26.0 - 34.0 pg   MCHC 33.8 30.0 - 36.0 g/dL   RDW 14.4 11.5 - 15.5 %   Platelets 47 (L) 150 - 400 K/uL    Comment: Immature Platelet Fraction may be clinically indicated, consider ordering this additional test [id number]    nRBC 0.0 0.0 - 0.2 %    Comment: Performed at [facility], [place] [address], [place], [place] [address]  Comprehensive metabolic panel     Status: Abnormal   Collection Time: 12/22/19  1:36 AM  Result Value Ref Range   Sodium 133 (L) 135 - 145 mmol/L   Potassium 3.5 3.5 - 5.1 mmol/L    Comment: DELTA CHECK NOTED   Chloride 102 98 - 111 mmol/L   CO2 16 (L) 22 - 32 mmol/L   Glucose, Bld 126 (H) 70 - 99 mg/dL    Comment: Glucose reference range  applies only to samples taken after fasting for at least 8 hours.   BUN 39 (H) 6 - 20 mg/dL   Creatinine, Ser 1.32 (H) 0.44 - 1.00 mg/dL   Calcium 7.9 (L) 8.9 - 10.3 mg/dL   Total Protein 5.3 (L) 6.5 - 8.1 g/dL   Albumin 1.8 (L) 3.5 - 5.0 g/dL   AST 207 (H) 15 - 41 U/L   ALT 58 (H) 0 - 44 U/L   Alkaline Phosphatase 240 (H) 38 - 126 U/L   Total Bilirubin 3.3 (H) 0.3 - 1.2 mg/dL   GFR calc non Af Amer 48 (L) >60 mL/min   GFR calc Af Amer 56 (L) >60 mL/min   Anion gap 15 5 - 15    Comment: Performed at [facility], [place] [address] [place], [place] [address]  Ammonia     Status: Abnormal   Collection Time: 12/22/19  1:36 AM  Result Value Ref Range   Ammonia 77 (H) 9 - 35 umol/L    Comment: Performed at [facility], [place] [address], [place], [place] [address]  Protime-INR     Status: Abnormal   Collection Time: 12/22/19  1:36 AM  Result Value Ref Range   Prothrombin Time 15.3 (H) 11.4 - 15.2 seconds   INR 1.3 (H) 0.8 - 1.2    Comment: (NOTE) INR goal varies based on device and disease states. Performed at [facility], [place] [address] [place], [place] [address]   Magnesium     Status: None   Collection Time: 12/22/19  1:36 AM  Result Value Ref Range   Magnesium 2.3 1.7 - 2.4 mg/dL    Comment: Performed at [facility], [place] [address] [place],  [address]  Phosphorus     Status: None   Collection Time: 12/22/19  1:36 AM  Result Value Ref Range   Phosphorus 3.6 2.5 - 4.6 mg/dL  Comment: Performed at [facility], [place] [address] [place], [place] [address]  Gamma GT     Status: Abnormal   Collection Time: 12/22/19 12:01 PM  Result Value Ref Range   GGT 531 (H) 7 - 50 U/L    Comment: Performed at [facility], [place] [address], [place], [place] [address]  Hepatitis panel, acute     Status: None   Collection Time: 12/22/19 12:01 PM  Result Value Ref Range    Hepatitis B Surface Ag NON REACTIVE NON REACTIVE   HCV Ab NON REACTIVE NON REACTIVE    Comment: (NOTE) Nonreactive HCV antibody screen is consistent with no HCV infections,  unless recent infection is suspected or other evidence exists to indicate HCV infection.    Hep A IgM NON REACTIVE NON REACTIVE   Hep B C IgM NON REACTIVE NON REACTIVE    Comment: Performed at [facility], [place] [address], [place], [place] [address]  Hepatitis A antibody, total     Status: None   Collection Time: 12/22/19 12:01 PM  Result Value Ref Range   hep A Total Ab NON REACTIVE NON REACTIVE    Comment: Performed at [place] [address] [place], [place] [address]  Hepatitis B core antibody, total     Status: None   Collection Time: 12/22/19 12:01 PM  Result Value Ref Range   Hep B Core Total Ab NON REACTIVE NON REACTIVE    Comment: Performed at [place] [address] [place], [place] [address]  Hepatitis B surface antibody     Status: Abnormal   Collection Time: 12/22/19 12:01 PM  Result Value Ref Range   Hepatitis B-Post <3.1 (L) Immunity>9.9 mIU/mL    Comment: (NOTE)  Status of Immunity                     Anti-HBs Level  ------------------                     -------------- Inconsistent with Immunity                   0.0 - 9.9 Consistent with Immunity                          >9.9 Performed At: [facility] [place], [place] [id number] Rush Farmer MD RJ:[id number]   Iron     Status: None   Collection Time: 12/22/19 12:01 PM  Result Value Ref Range   Iron 78 28 - 170 ug/dL    Comment: Performed at [facility] [place], [place] [address], [place], [place] [address]  Ferritin     Status: Abnormal   Collection Time: 12/22/19 12:01 PM  Result Value Ref Range   Ferritin 918 (H) 11 - 307 ng/mL    Comment: Performed at [facility], [place] [address], [place], [place] [address]  CBC with Differential/Platelet     Status:  Abnormal   Collection Time: 12/23/19  1:40 AM  Result Value Ref Range   WBC 15.8 (H) 4.0 - 10.5 K/uL   RBC 3.97 3.87 - 5.11 MIL/uL   Hemoglobin 13.1 12.0 - 15.0 g/dL   HCT 37.2 36.0 - 46.0 %   MCV 93.7 80.0 - 100.0 fL   MCH 33.0 26.0 - 34.0 pg   MCHC 35.2 30.0 - 36.0 g/dL   RDW 14.7  11.5 - 15.5 %   Platelets 31 (L) 150 - 400 K/uL    Comment: Immature Platelet Fraction may be clinically indicated, consider ordering this additional test [id number]    nRBC 0.0 0.0 - 0.2 %   Neutrophils Relative % 91 %   Neutro Abs 14.3 (H) 1.7 - 7.7 K/uL   Lymphocytes Relative 5 %   Lymphs Abs 0.9 0.7 - 4.0 K/uL   Monocytes Relative 2 %   Monocytes Absolute 0.3 0.1 - 1.0 K/uL   Eosinophils Relative 0 %   Eosinophils Absolute 0.1 0.0 - 0.5 K/uL   Basophils Relative 0 %   Basophils Absolute 0.0 0.0 - 0.1 K/uL   Immature Granulocytes 2 %   Abs Immature Granulocytes 0.26 (H) 0.00 - 0.07 K/uL    Comment: Performed at [facility], [place] [address], [place], [place] [address]  Comprehensive metabolic panel     Status: Abnormal   Collection Time: 12/23/19  1:40 AM  Result Value Ref Range   Sodium 134 (L) 135 - 145 mmol/L   Potassium 4.0 3.5 - 5.1 mmol/L   Chloride 105 98 - 111 mmol/L   CO2 16 (L) 22 - 32 mmol/L   Glucose, Bld 171 (H) 70 - 99 mg/dL    Comment: Glucose reference range applies only to samples taken after fasting for at least 8 hours.   BUN 33 (H) 6 - 20 mg/dL   Creatinine, Ser 0.84 0.44 - 1.00 mg/dL   Calcium 8.1 (L) 8.9 - 10.3 mg/dL   Total Protein 5.3 (L) 6.5 - 8.1 g/dL   Albumin 1.6 (L) 3.5 - 5.0 g/dL   AST 159 (H) 15 - 41 U/L   ALT 68 (H) 0 - 44 U/L   Alkaline Phosphatase 311 (H) 38 - 126 U/L   Total Bilirubin 3.4 (H) 0.3 - 1.2 mg/dL   GFR calc non Af Amer >60 >60 mL/min   GFR calc Af Amer >60 >60 mL/min   Anion gap 13 5 - 15    Comment: Performed at [facility], [place] [address] [place], [place] [address]  Magnesium     Status: None    Collection Time: 12/23/19  1:40 AM  Result Value Ref Range   Magnesium 2.0 1.7 - 2.4 mg/dL    Comment: Performed at [facility], [place] [address] [place], [place] [address]  Lactic acid, plasma     Status: Abnormal   Collection Time: 12/23/19  1:40 AM  Result Value Ref Range   Lactic Acid, Venous 3.3 (HH) 0.5 - 1.9 mmol/L    Comment: CRITICAL VALUE NOTED.  VALUE IS CONSISTENT WITH PREVIOUSLY REPORTED AND CALLED VALUE. Performed at [facility], [place] [address], [place], [place] [address]   Urinalysis, Routine w reflex microscopic     Status: Abnormal   Collection Time: 12/23/19  3:30 AM  Result Value Ref Range   Color, Urine AMBER (A) YELLOW    Comment: BIOCHEMICALS MAY BE AFFECTED BY COLOR   APPearance HAZY (A) CLEAR   Specific Gravity, Urine 1.023 1.005 - 1.030   pH 5.0 5.0 - 8.0   Glucose, UA NEGATIVE NEGATIVE mg/dL   Hgb urine dipstick MODERATE (A) NEGATIVE   Bilirubin Urine SMALL (A) NEGATIVE   Ketones, ur 5 (A) NEGATIVE mg/dL   Protein, ur 30 (A) NEGATIVE mg/dL   Nitrite NEGATIVE NEGATIVE   Leukocytes,Ua NEGATIVE NEGATIVE   RBC / HPF 0-5 0 - 5 RBC/hpf  WBC, UA 0-5 0 - 5 WBC/hpf   Bacteria, UA RARE (A) NONE SEEN   Squamous Epithelial / LPF 0-5 0 - 5   Mucus PRESENT    Hyaline Casts, UA PRESENT    Ca Oxalate Crys, UA PRESENT     Comment: Performed at [facility], [place] [address] [place], [place] [address]  Glucose, capillary     Status: Abnormal   Collection Time: 12/23/19  9:52 AM  Result Value Ref Range   Glucose-Capillary 116 (H) 70 - 99 mg/dL    Comment: Glucose reference range applies only to samples taken after fasting for at least 8 hours.  Ammonia     Status: Abnormal   Collection Time: 12/23/19 10:39 AM  Result Value Ref Range   Ammonia 76 (H) 9 - 35 umol/L    Comment: Performed at [facility], [place] [address] [place], [place] [address]  CBC with Differential/Platelet     Status:  Abnormal   Collection Time: 12/23/19 10:39 AM  Result Value Ref Range   WBC 15.6 (H) 4.0 - 10.5 K/uL   RBC 3.78 (L) 3.87 - 5.11 MIL/uL   Hemoglobin 12.4 12.0 - 15.0 g/dL   HCT 35.5 (L) 36.0 - 46.0 %   MCV 93.9 80.0 - 100.0 fL   MCH 32.8 26.0 - 34.0 pg   MCHC 34.9 30.0 - 36.0 g/dL   RDW 14.8 11.5 - 15.5 %   Platelets 32 (L) 150 - 400 K/uL    Comment: CONSISTENT WITH PREVIOUS RESULT Immature Platelet Fraction may be clinically indicated, consider ordering this additional test [id number]    nRBC 0.1 0.0 - 0.2 %   Neutrophils Relative % 87 %   Neutro Abs 13.6 (H) 1.7 - 7.7 K/uL   Lymphocytes Relative 7 %   Lymphs Abs 1.0 0.7 - 4.0 K/uL   Monocytes Relative 2 %   Monocytes Absolute 0.4 0.1 - 1.0 K/uL   Eosinophils Relative 1 %   Eosinophils Absolute 0.1 0.0 - 0.5 K/uL   Basophils Relative 1 %   Basophils Absolute 0.1 0.0 - 0.1 K/uL   WBC Morphology DOHLE BODIES     Comment: TOXIC GRANULATION VACUOLATED NEUTROPHILS    Immature Granulocytes 2 %   Abs Immature Granulocytes 0.37 (H) 0.00 - 0.07 K/uL   Target Cells PRESENT     Comment: Performed at [facility], [place] [address] [place], [place] [address]  Comprehensive metabolic panel     Status: Abnormal   Collection Time: 12/23/19 10:39 AM  Result Value Ref Range   Sodium 137 135 - 145 mmol/L   Potassium 3.4 (L) 3.5 - 5.1 mmol/L   Chloride 106 98 - 111 mmol/L   CO2 19 (L) 22 - 32 mmol/L   Glucose, Bld 136 (H) 70 - 99 mg/dL    Comment: Glucose reference range applies only to samples taken after fasting for at least 8 hours.   BUN 34 (H) 6 - 20 mg/dL   Creatinine, Ser 0.75 0.44 - 1.00 mg/dL   Calcium 8.1 (L) 8.9 - 10.3 mg/dL   Total Protein 5.1 (L) 6.5 - 8.1 g/dL   Albumin 1.5 (L) 3.5 - 5.0 g/dL   AST 123 (H) 15 - 41 U/L   ALT 62 (H) 0 - 44 U/L   Alkaline Phosphatase 318 (H) 38 - 126 U/L   Total Bilirubin 2.8 (H) 0.3 - 1.2 mg/dL   GFR calc non Af Amer >60 >60 mL/min  GFR calc Af Amer >60 >60 mL/min    Anion gap 12 5 - 15    Comment: Performed at [facility], [place] [address] [place], [place] [address]  Lactic acid, plasma     Status: Abnormal   Collection Time: 12/23/19 10:39 AM  Result Value Ref Range   Lactic Acid, Venous 2.3 (HH) 0.5 - 1.9 mmol/L    Comment: CRITICAL VALUE NOTED.  VALUE IS CONSISTENT WITH PREVIOUSLY REPORTED AND CALLED VALUE. Performed at [facility], [place] [address] [place], [place] [address]    DG Sacrum/Coccyx  Result Date: 12/21/2019 CLINICAL DATA:  [address] last week due to weakness, continued sacrococcygeal pain EXAM: SACRUM AND COCCYX - 2+ VIEW COMPARISON:  None FINDINGS: Osseous demineralization. Narrowing of LEFT hip joint. SI joints and RIGHT hip joint space preserved. Sacral foramina symmetric. No definite sacrococcygeal fracture identified. IMPRESSION: No definite sacrococcygeal fracture identified. Degenerative changes LEFT hip joint. Electronically Signed   By: Lavonia Dana M.D.   On: 12/21/2019 18:10   CT Head Wo Contrast  Result Date: 12/21/2019 CLINICAL DATA:  Weakness. EXAM: CT HEAD WITHOUT CONTRAST TECHNIQUE: Contiguous axial images were obtained from the base of the skull through the vertex without intravenous contrast. COMPARISON:  None. FINDINGS: Brain: Ventricles and sulci are appropriate for patient's age. No evidence for acute cortically based infarct, intracranial hemorrhage, mass lesion or mass-effect. Vascular: Unremarkable Skull: Intact. Sinuses/Orbits: Paranasal sinuses well aerated. Mastoid air cells unremarkable. Orbits unremarkable. Other: None. IMPRESSION: No acute intracranial process. Electronically Signed   By: Lovey Newcomer M.D.   On: 12/21/2019 15:49   US Abdomen Complete  Result Date: 12/22/2019 CLINICAL DATA:  Elevated liver enzymes EXAM: ABDOMEN ULTRASOUND COMPLETE COMPARISON:  None. FINDINGS: Gallbladder: No gallstones or wall thickening visualized. There is no pericholecystic fluid. No sonographic  Murphy sign noted by sonographer. Common bile duct: Diameter: 4 mm. No intrahepatic, common hepatic, or common bile duct dilatation. Liver: No focal lesion identified. Liver echogenicity is increased diffusely. Portal vein is patent on color Doppler imaging with normal direction of blood flow towards the liver. IVC: No abnormality visualized. Pancreas: No pancreatic mass or inflammatory focus. Spleen: Size and appearance within normal limits. Right Kidney: Length: 11.5 cm. Echogenicity within normal limits. No mass or hydronephrosis visualized. Left Kidney: Length: 11.2 cm. Echogenicity within normal limits. No mass or hydronephrosis visualized. Abdominal aorta: No aneurysm visualized. Other findings: No demonstrable ascites. IMPRESSION: 1. Diffuse increase in liver echogenicity, a finding indicative of hepatic steatosis. While no focal liver lesions are evident on this study, it must be cautioned that the sensitivity of ultrasound for detection of focal liver lesions is diminished in this circumstance. 2.  Study otherwise unremarkable. Electronically Signed   By: Lowella Grip III M.D.   On: 12/22/2019 20:09   DG CHEST PORT 1 VIEW  Result Date: 12/23/2019 CLINICAL DATA:  Dyspnea EXAM: PORTABLE CHEST 1 VIEW COMPARISON:  None. FINDINGS: Cardiomegaly. Low volume chest with streaky density on both sides. No Kerley lines, effusion, or pneumothorax. IMPRESSION: Low volume chest with bilateral streaky opacity likely reflecting atelectasis. Cardiomegaly. Electronically Signed   By: Monte Fantasia M.D.   On: 12/23/2019 10:42   Assessment/Plan Hepatic encephalopathy Alcohol abuse Thrombocytopenia  HTN  Sepsis  SSTI left buttock, concerning for necrotizing soft tissue infection - appreciate CCM management of sepsis  - IV abx - recommend surgical debridement in the OR  - spoke with patients son, Tray, at bedside. The patient is full code.  Jill Alexanders, [facility] 12/23/2019,  11:48 AM

## 2019-12-23 NOTE — Progress Notes (Signed)
[facility] Progress Note Patient Name: Alyssa Carey DOB: [birth year] MRN: [id number]   Date of Service  12/23/2019  HPI/Events of Note  Patient returns from OR intubated, ventilated and sedated with Propofol IV infusion.  Portable CXR already ordered.   eICU Interventions  Will order: 1. Ventilator settings: 60%/PRVC 20/TV 440/P 5.  2. ABG at 10 PM.  3. Portable Abdominal xray for OGT placement.      Intervention Category Major Interventions: Respiratory failure - evaluation and management  Rowland Ericsson Cornelia Copa 12/23/2019, 9:38 PM

## 2019-12-23 NOTE — Progress Notes (Signed)
Patient ID: Alyssa Carey, female   DOB: 1971/01/09, 49 y.o.   MRN: [id number]  PROGRESS NOTE    Alyssa Carey  WSF:[id number] DOB: 06/18/1971 DOA: 12/21/2019 PCP: Patient, No Pcp Per   Brief Narrative:  49 year old female with history of alcohol abuse presented with worsening generalized weakness and fall along with?  Visual hallucinations.  On presentation, she had elevated LFTs; urine drug screen was negative.  Patient was admitted for alcoholic hepatitis.  CT of the brain was negative for acute abnormality.  GI was consulted.  Assessment & Plan:   Probable infected buttock wounds  Leukocytosis -Patient is very drowsy this morning with white count of 15.8 from 7.2 yesterday. -Wound care nurse has evaluated the  buttock wounds again this morning and is concerned about worsening infection versus necrotizing fasciitis -She is very drowsy.  Will repeat stat labs including CBC, ammonia and CMP and lactic acid.  Blood cultures have already been sent -Start Rocephin/Flagyl/vancomycin. -I have notified general surgery for consultation.  Keep her n.p.o.  Acute alcoholic hepatitis with elevated LFTs Hepatic encephalopathy -Presented with progressively worsening generalized weakness and fall along with questionable visual hallucinations -Ammonia 76 on presentation with elevated AST and ALT along with bilirubin.  LFTs are still elevated.  Monitor. -Continue lactulose. -GI following: Ultrasound of the abdomen shows findings indicative of hepatic steatosis -Urine tox screen was negative  Acute renal failure -Probably from poor oral intake and dehydration -Improving.  Creatinine 0.84 today.  Currently on IV fluids.   History of alcohol abuse -CIWA protocol.  Fall precautions.  Continue folic acid, thiamine and multivitamin -Social worker consult  Fall with weakness -From encephalopathy.  PT eval once more stable.  Fall precautions  Thrombocytopenia -Probably from alcohol abuse.  No signs  of bleeding.  Monitor  Hypoalbuminemia -Probably from poor oral intake and alcohol abuse.  Nutrition consult.  Multiple stage II pressure ulcer on left buttock: Present on admission -Follow wound care recommendations  Pressure Injury 12/21/19 Buttocks Left Unstageable - Full thickness tissue loss in which the base of the injury is covered by slough (yellow, tan, gray, green or brown) and/or eschar (tan, brown or black) in the wound bed. 2 areas of unstageable on left  (Active)  12/21/19 2200  Location: Buttocks  Location Orientation: Left  Staging: Unstageable - Full thickness tissue loss in which the base of the injury is covered by slough (yellow, tan, gray, green or brown) and/or eschar (tan, brown or black) in the wound bed.  Wound Description (Comments): 2 areas of unstageable on left buttock: 7X3cm and 1X2cm  Present on Admission: Yes     Pressure Injury 12/22/19 Buttocks Left Stage 2 -  Partial thickness loss of dermis presenting as a shallow open injury with a red, pink wound bed without slough. 3 areas of stage 2 to left buttocks; inner gluteal, left lower buttock in 2 locations (Active)  12/22/19   Location: Buttocks  Location Orientation: Left  Staging: Stage 2 -  Partial thickness loss of dermis presenting as a shallow open injury with a red, pink wound bed without slough.  Wound Description (Comments): 3 areas of stage 2 to left buttocks; inner gluteal, left lower buttock in 2 locations  Present on Admission: Yes     Pressure Injury 12/22/19 Rectum Right Stage 3 -  Full thickness tissue loss. Subcutaneous fat may be visible but bone, tendon or muscle are NOT exposed. tunneling hole, quarter sized (Active)  12/22/19 2115  Location: Rectum  Location  Orientation: Right  Staging: Stage 3 -  Full thickness tissue loss. Subcutaneous fat may be visible but bone, tendon or muscle are NOT exposed.  Wound Description (Comments): tunneling hole, quarter sized  Present on Admission:  Yes   Replace her Foley catheter while the patient is in the hospital because of chances of infection     DVT prophylaxis:  SCDs.  No heparin due to thrombocytopenia Code Status: Full Family Communication: None at bedside.   Disposition Plan: Status is: Inpatient  Remains inpatient appropriate because:Altered mental status.  Overall condition has worsened with worsening wound, leukocytosis and patient is still altered.  Dispo: The patient is from: Home              Anticipated d/c is to: Home              Anticipated d/c date is: > 3 days              Patient currently is not medically stable to d/c.  Consultants: GI  Procedures: None  Antimicrobials: None   Subjective: Patient seen and examined at bedside.  Sleepy, wakes up on calling her name.  No overnight fever, vomiting, seizures reported by nursing staff. Objective: Vitals:   12/22/19 2045 12/23/19 0031 12/23/19 0149 12/23/19 0522  BP: 112/78 120/85  104/70  Pulse: (!) 115 (!) 115 (!) 108 (!) 108  Resp: (!) 24 (!) 25  (!) 24  Temp: 97.9 F (36.6 C) 97.8 F (36.6 C)  98.1 F (36.7 C)  TempSrc: Oral Oral  Oral  SpO2: 100% 98% 99% 100%  Weight:    97 kg  Height:    5' 4"  (1.626 m)    Intake/Output Summary (Last 24 hours) at 12/23/2019 0801 Last data filed at 12/23/2019 0500 Gross per 24 hour  Intake 2601.93 ml  Output 900 ml  Net 1701.93 ml   Filed Weights   12/23/19 0522  Weight: 97 kg    Examination:  General exam: No distress.  Looks chronically ill and older than stated age. Respiratory system: Bilateral decreased breath sounds at bases with scattered crackles.  Intermittently tachypneic  cardiovascular system: Still tachycardic, S1-S2 heard Gastrointestinal system: Abdomen is mildly distended, soft and nontender. Normal bowel sounds heard. Extremities: Trace lower extremity edema.  No cyanosis or clubbing  Central nervous system: Very drowsy, wakes up on calling her name.  Awake very poor historian.   No focal neurological deficits. Moving extremities Skin: No petechiae/ecchymosis Psychiatry: Cannot assess due to mental status    Data Reviewed: I have personally reviewed following labs and imaging studies  CBC: Recent Labs  Lab 12/21/19 1448 12/22/19 0136 12/23/19 0140  WBC 11.8* 7.2 15.8*  NEUTROABS  --   --  14.3*  HGB 14.5 12.5 13.1  HCT 45.0 37.0 37.2  MCV 100.2* 94.1 93.7  PLT 67* 47* 31*   Basic Metabolic Panel: Recent Labs  Lab 12/21/19 1658 12/22/19 0136 12/23/19 0140  NA 131* 133* 134*  K 4.4 3.5 4.0  CL 97* 102 105  CO2 19* 16* 16*  GLUCOSE 110* 126* 171*  BUN 35* 39* 33*  CREATININE 1.74* 1.32* 0.84  CALCIUM 8.2* 7.9* 8.1*  MG  --  2.3 2.0  PHOS  --  3.6  --    GFR: Estimated Creatinine Clearance: 94.6 mL/min (by C-G formula based on SCr of 0.84 mg/dL). Liver Function Tests: Recent Labs  Lab 12/21/19 1658 12/22/19 0136 12/23/19 0140  AST 199* 207* 159*  ALT 58*  58* 68*  ALKPHOS 242* 240* 311*  BILITOT 3.5* 3.3* 3.4*  PROT 5.6* 5.3* 5.3*  ALBUMIN 1.8* 1.8* 1.6*   No results for input(s): LIPASE, AMYLASE in the last 168 hours. Recent Labs  Lab 12/21/19 1658 12/22/19 0136  AMMONIA 76* 77*   Coagulation Profile: Recent Labs  Lab 12/22/19 0136  INR 1.3*   Cardiac Enzymes: No results for input(s): CKTOTAL, CKMB, CKMBINDEX, TROPONINI in the last 168 hours. BNP (last 3 results) No results for input(s): PROBNP in the last 8760 hours. HbA1C: No results for input(s): HGBA1C in the last 72 hours. CBG: Recent Labs  Lab 12/21/19 1521  GLUCAP 127*   Lipid Profile: No results for input(s): CHOL, HDL, LDLCALC, TRIG, CHOLHDL, LDLDIRECT in the last 72 hours. Thyroid Function Tests: No results for input(s): TSH, T4TOTAL, FREET4, T3FREE, THYROIDAB in the last 72 hours. Anemia Panel: Recent Labs    12/22/19 1201  FERRITIN 918*  IRON 78   Sepsis Labs: Recent Labs  Lab 12/21/19 1529 12/21/19 2231 12/23/19 0140  LATICACIDVEN 3.6*  2.9* 3.3*    Recent Results (from the past 240 hour(s))  SARS Coronavirus 2 by RT PCR (hospital order, performed in [facility] hospital lab) Nasopharyngeal Nasopharyngeal Swab     Status: None   Collection Time: 12/21/19  6:26 PM   Specimen: Nasopharyngeal Swab  Result Value Ref Range Status   SARS Coronavirus 2 NEGATIVE NEGATIVE Final    Comment: (NOTE) SARS-CoV-2 target nucleic acids are NOT DETECTED. The SARS-CoV-2 RNA is generally detectable in upper and lower respiratory specimens during the acute phase of infection. The lowest concentration of SARS-CoV-2 viral copies this assay can detect is 250 copies / mL. A negative result does not preclude SARS-CoV-2 infection and should not be used as the sole basis for treatment or other patient management decisions.  A negative result may occur with improper specimen collection / handling, submission of specimen other than nasopharyngeal swab, presence of viral mutation(s) within the areas targeted by this assay, and inadequate number of viral copies (<250 copies / mL). A negative result must be combined with clinical observations, patient history, and epidemiological information. Fact Sheet for Patients:   StrictlyIdeas.no Fact Sheet for Healthcare Providers: BankingDealers.co.za This test is not yet approved or cleared  by the [place] FDA and has been authorized for detection and/or diagnosis of SARS-CoV-2 by FDA under an Emergency Use Authorization (EUA).  This EUA will remain in effect (meaning this test can be used) for the duration of the COVID-19 declaration under Section 564(b)(1) of the Act, 21 U.S.C. section 360bbb-3(b)(1), unless the authorization is terminated or revoked sooner. Performed at [facility], [place] [address], [place], [place] [address]          Radiology Studies: DG Sacrum/Coccyx  Result Date: 12/21/2019 CLINICAL DATA:  [place] last  week due to weakness, continued sacrococcygeal pain EXAM: SACRUM AND COCCYX - 2+ VIEW COMPARISON:  None FINDINGS: Osseous demineralization. Narrowing of LEFT hip joint. SI joints and RIGHT hip joint space preserved. Sacral foramina symmetric. No definite sacrococcygeal fracture identified. IMPRESSION: No definite sacrococcygeal fracture identified. Degenerative changes LEFT hip joint. Electronically Signed   By: Lavonia Dana M.D.   On: 12/21/2019 18:10   CT Head Wo Contrast  Result Date: 12/21/2019 CLINICAL DATA:  Weakness. EXAM: CT HEAD WITHOUT CONTRAST TECHNIQUE: Contiguous axial images were obtained from the base of the skull through the vertex without intravenous contrast. COMPARISON:  None. FINDINGS: Brain: Ventricles and sulci are appropriate  for patient's age. No evidence for acute cortically based infarct, intracranial hemorrhage, mass lesion or mass-effect. Vascular: Unremarkable Skull: Intact. Sinuses/Orbits: Paranasal sinuses well aerated. Mastoid air cells unremarkable. Orbits unremarkable. Other: None. IMPRESSION: No acute intracranial process. Electronically Signed   By: Lovey Newcomer M.D.   On: 12/21/2019 15:49   US Abdomen Complete  Result Date: 12/22/2019 CLINICAL DATA:  Elevated liver enzymes EXAM: ABDOMEN ULTRASOUND COMPLETE COMPARISON:  None. FINDINGS: Gallbladder: No gallstones or wall thickening visualized. There is no pericholecystic fluid. No sonographic Murphy sign noted by sonographer. Common bile duct: Diameter: 4 mm. No intrahepatic, common hepatic, or common bile duct dilatation. Liver: No focal lesion identified. Liver echogenicity is increased diffusely. Portal vein is patent on color Doppler imaging with normal direction of blood flow towards the liver. IVC: No abnormality visualized. Pancreas: No pancreatic mass or inflammatory focus. Spleen: Size and appearance within normal limits. Right Kidney: Length: 11.5 cm. Echogenicity within normal limits. No mass or hydronephrosis  visualized. Left Kidney: Length: 11.2 cm. Echogenicity within normal limits. No mass or hydronephrosis visualized. Abdominal aorta: No aneurysm visualized. Other findings: No demonstrable ascites. IMPRESSION: 1. Diffuse increase in liver echogenicity, a finding indicative of hepatic steatosis. While no focal liver lesions are evident on this study, it must be cautioned that the sensitivity of ultrasound for detection of focal liver lesions is diminished in this circumstance. 2.  Study otherwise unremarkable. Electronically Signed   By: Lowella Grip III M.D.   On: 12/22/2019 20:09        Scheduled Meds: . collagenase   Topical Daily  . feeding supplement (ENSURE ENLIVE)  237 mL Oral BID BM  . folic acid  1 mg Oral Daily  . lactulose  20 g Oral BID  . LORazepam  0-4 mg Intravenous Q6H   Followed by  . [START ON 12/24/2019] LORazepam  0-4 mg Intravenous Q12H  . multivitamin with minerals  1 tablet Oral Daily  . nutrition supplement (JUVEN)  1 packet Oral BID BM  . pneumococcal 23 valent vaccine  0.5 mL Intramuscular Tomorrow-1000  . thiamine  100 mg Oral Daily   Or  . thiamine  100 mg Intravenous Daily   Continuous Infusions: . cefTRIAXone (ROCEPHIN)  IV Stopped (12/23/19 0410)  . dextrose 5 % and 0.9% NaCl Stopped (12/23/19 0057)          Aline August, MD [facility] 12/23/2019, 8:01 AM

## 2019-12-23 NOTE — Anesthesia Procedure Notes (Signed)
Arterial Line Insertion Start/End6/02/2020 6:52 PM, 12/23/2019 6:55 PM Performed by: Audry Pili, MD, anesthesiologist  Patient location: Pre-op. Preanesthetic checklist: patient identified, IV checked, risks and benefits discussed, surgical consent, monitors and equipment checked, pre-op evaluation, timeout performed and anesthesia consent Lidocaine 1% used for infiltration and patient sedated Left, radial was placed Catheter size: 20 G Hand hygiene performed   Attempts: 2 (Previous attempts by CRNA unsuccessful on right radial. First attempt by Dr. Fransisco Beau on left radial successful.) Procedure performed using ultrasound guided (Ultrasound picture did not print.) technique. Ultrasound Notes:anatomy identified, needle tip was noted to be adjacent to the nerve/plexus identified and no ultrasound evidence of intravascular and/or intraneural injection Following insertion, dressing applied and Biopatch. Post procedure assessment: unchanged and normal  Patient tolerated the procedure well with no immediate complications.

## 2019-12-23 NOTE — Progress Notes (Addendum)
[facility] Progress Note  CC:  Alcoholic hepatitis, hepatic encephalopathy   Subjective: She is lethargic, difficult to arouse. She is able to tell me her name.  She underwent a pelvic CT to assess a sacral wound earlier this morning which showed a probable gas forming infection. Started on Clindamycin, Rocephin, Flagyl and Vancomycin.  CT of the head was negative. Patient to transfer to ICU. Her son is a the bedside.   Objective:   Abdominal sonogram 12/22/2019: 1. Diffuse increase in liver echogenicity, a finding indicative of hepatic steatosis. While no focal liver lesions are evident on this study, it must be cautioned that the sensitivity of ultrasound for detection of focal liver lesions is diminished in this circumstance. 2.  Study otherwise unremarkable  Pelvic CT with contrast 12/23/2019 to evaluate sacral decubitus wound.  Gas throughout the medial subcutaneous soft tissues of the left buttock extending into the left perineum concerning for gas-forming Infection.  CT of the head  12/23/2019:negative for acute abnormality   Vital signs in last 24 hours: Temp:  [97.6 F (36.4 C)-98.7 F (37.1 C)] 97.8 F (36.6 C) (06/08 1129) Pulse Rate:  [108-125] 119 (06/08 1129) Resp:  [20-38] 34 (06/08 1129) BP: (103-127)/(70-95) 103/72 (06/08 1129) SpO2:  [97 %-100 %] 100 % (06/08 1129) Weight:  [97 kg] 97 kg (06/08 0522) Last BM Date: 12/22/19 General:   Lethargic 49 year old female, to transfer to the ICU.  Heart:  Tachycardic. No murmur.   Pulm:  Diminished breath sounds throughout.  Abdomen: Soft, nondistended. Hypoactive BS x 4 quads. Extremities:  Without edema. Neurologic:  Difficult to arouse. She can tell me her name. She follows simple commands.  Psych:  Alert and cooperative. Normal mood and affect.  Intake/Output from previous day: 06/07 0701 - 06/08 0700 In: 2601.9 [I.V.:2004.6; IV Piggyback:597.3] Out: 900 [Urine:900] Intake/Output this  shift: No intake/output data recorded.  Lab Results: Recent Labs    12/22/19 0136 12/23/19 0140 12/23/19 1039  WBC 7.2 15.8* 15.6*  HGB 12.5 13.1 12.4  HCT 37.0 37.2 35.5*  PLT 47* 31* 32*   BMET Recent Labs    12/22/19 0136 12/23/19 0140 12/23/19 1039  NA 133* 134* 137  K 3.5 4.0 3.4*  CL 102 105 106  CO2 16* 16* 19*  GLUCOSE 126* 171* 136*  BUN 39* 33* 34*  CREATININE 1.32* 0.84 0.75  CALCIUM 7.9* 8.1* 8.1*   LFT Recent Labs    12/23/19 1039  PROT 5.1*  ALBUMIN 1.5*  AST 123*  ALT 62*  ALKPHOS 318*  BILITOT 2.8*   PT/INR Recent Labs    12/22/19 0136  LABPROT 15.3*  INR 1.3*   Hepatitis Panel Recent Labs    12/22/19 1201  HEPBSAG NON REACTIVE  HCVAB NON REACTIVE  HEPAIGM NON REACTIVE  HEPBIGM NON REACTIVE    DG Sacrum/Coccyx  Result Date: 12/21/2019 CLINICAL DATA:  [place] last week due to weakness, continued sacrococcygeal pain EXAM: SACRUM AND COCCYX - 2+ VIEW COMPARISON:  None FINDINGS: Osseous demineralization. Narrowing of LEFT hip joint. SI joints and RIGHT hip joint space preserved. Sacral foramina symmetric. No definite sacrococcygeal fracture identified. IMPRESSION: No definite sacrococcygeal fracture identified. Degenerative changes LEFT hip joint. Electronically Signed   By: Lavonia Dana M.D.   On: 12/21/2019 18:10   CT Head Wo Contrast  Result Date: 12/21/2019 CLINICAL DATA:  Weakness. EXAM: CT HEAD WITHOUT CONTRAST TECHNIQUE: Contiguous axial images were obtained from the base of the skull through the vertex  without intravenous contrast. COMPARISON:  None. FINDINGS: Brain: Ventricles and sulci are appropriate for patient's age. No evidence for acute cortically based infarct, intracranial hemorrhage, mass lesion or mass-effect. Vascular: Unremarkable Skull: Intact. Sinuses/Orbits: Paranasal sinuses well aerated. Mastoid air cells unremarkable. Orbits unremarkable. Other: None. IMPRESSION: No acute intracranial process. Electronically Signed   By:  Lovey Newcomer M.D.   On: 12/21/2019 15:49   CT PELVIS W CONTRAST  Result Date: 12/23/2019 CLINICAL DATA:  Sacral wounds, question infection EXAM: CT PELVIS WITH CONTRAST TECHNIQUE: Multidetector CT imaging of the pelvis was performed using the standard protocol following the bolus administration of intravenous contrast. CONTRAST:  123m OMNIPAQUE IOHEXOL 300 MG/ML  SOLN COMPARISON:  None. FINDINGS: Urinary Tract: No abnormality visualized. Foley catheter in place. Urinary bladder decompressed. Bowel:  Unremarkable visualized pelvic bowel loops. Vascular/Lymphatic: No pathologically enlarged lymph nodes. No significant vascular abnormality seen. Reproductive:  No mass or other significant abnormality Other: There is gas noted throughout the subcutaneous soft tissues of the left buttock, extending into the left perineum concerning for infection. No drainable fluid collections. No acute bony abnormality. Musculoskeletal: No acute bony abnormality. IMPRESSION: Gas throughout the medial subcutaneous soft tissues of the left buttock extending into the left perineum concerning for gas-forming infection. Electronically Signed   By: KRolm BaptiseM.D.   On: 12/23/2019 11:46   UKoreaAbdomen Complete  Result Date: 12/22/2019 CLINICAL DATA:  Elevated liver enzymes EXAM: ABDOMEN ULTRASOUND COMPLETE COMPARISON:  None. FINDINGS: Gallbladder: No gallstones or wall thickening visualized. There is no pericholecystic fluid. No sonographic Murphy sign noted by sonographer. Common bile duct: Diameter: 4 mm. No intrahepatic, common hepatic, or common bile duct dilatation. Liver: No focal lesion identified. Liver echogenicity is increased diffusely. Portal vein is patent on color Doppler imaging with normal direction of blood flow towards the liver. IVC: No abnormality visualized. Pancreas: No pancreatic mass or inflammatory focus. Spleen: Size and appearance within normal limits. Right Kidney: Length: 11.5 cm. Echogenicity within normal  limits. No mass or hydronephrosis visualized. Left Kidney: Length: 11.2 cm. Echogenicity within normal limits. No mass or hydronephrosis visualized. Abdominal aorta: No aneurysm visualized. Other findings: No demonstrable ascites. IMPRESSION: 1. Diffuse increase in liver echogenicity, a finding indicative of hepatic steatosis. While no focal liver lesions are evident on this study, it must be cautioned that the sensitivity of ultrasound for detection of focal liver lesions is diminished in this circumstance. 2.  Study otherwise unremarkable. Electronically Signed   By: WLowella GripIII M.D.   On: 12/22/2019 20:09   DG CHEST PORT 1 VIEW  Result Date: 12/23/2019 CLINICAL DATA:  Dyspnea EXAM: PORTABLE CHEST 1 VIEW COMPARISON:  None. FINDINGS: Cardiomegaly. Low volume chest with streaky density on both sides. No Kerley lines, effusion, or pneumothorax. IMPRESSION: Low volume chest with bilateral streaky opacity likely reflecting atelectasis. Cardiomegaly. Electronically Signed   By: JMonte FantasiaM.D.   On: 12/23/2019 10:42    Assessment / Plan:  177  49year old female with acute alcoholic hepatitis with hepatic encephalopathy.   AST:ALT ratio consistent with alcoholic liver disease. AST 207 -> 123.  ALT 58 ->62.  Alk phos 311 -> 318. Total bili 3.3 -> 2.8.  Ammonia 77 -> 76.  MDF < 32 therefore she does meet criteria for Prednisolone.  IgG, ANA, SMA, AMA, A1AT and ceruloplasmin levels pending.  -Continue to monitor hepatic panel daily -Agree with transfer to ICU  2. Leukocytosis. Most likely due to sacral wound.  WBC 15.6. Blood and urine cultures pending.  Pelvic MRI done today showed probable gas forming infection.  Started on Clindamycin, Rocephin, Flagyl and Vancomycin. Dr. Starla Link has contacted general surgery. She is NPO. HR 119. BP 103/72.  Afebrile. To transfer to the ICU.  3. Acute renal failure, resolved. Cr 1.74 -> 1.32 -> 0.75.  4. Thrombocytopenia secondary to alcoholic hepatitis.   PLT 47 -> 32. -Repeat CBC with PLT count in am, if further decline in platelet count she may require platelet transfusion    Further recommendations per Dr. Fuller Plan    LOS: 2 days   Noralyn Pick  12/23/2019, 11:59 AM      Attending Physician Note   I have taken a history, examined the patient and reviewed the chart. I agree with the Advanced Practitioner's note, impression and recommendations.  Alcoholic hepatitis with HE. Transaminases and t bili are improving which is a reassuring trend Sacral wound infection/sepsis per CCM and Gen Surgery   Continue supportive care for alcoholic hepatitis Sepsis could worsen LFTs and slow recovery of alcoholic hepatitis   Trend CBC, CMP, ammonia, PT/INR Awaiting a few additional hepatic serologies however expect they will be normal/negative GI signing off and available if needed   Lucio Edward, MD [facility]

## 2019-12-23 NOTE — Progress Notes (Signed)
AMION page sent to [facility] to come re assess patient wound as a tunneling wound was found to right buttocks near rectum.

## 2019-12-23 NOTE — Progress Notes (Signed)
MD notified that patient's vitals were still elevated after ativan. Discussed labs and meds, see new orders.  Will continue to monitor.

## 2019-12-23 NOTE — Progress Notes (Signed)
[facility] Progress Note Patient Name: Alyssa Carey DOB: [birth year] MRN: [id number]   Date of Service  12/23/2019  HPI/Events of Note  ABG on 60%/PRVC 20/TV 440/P 5 = 7.366/26.1/218 and Hypokalemia - K+ = 2.4 and Creatinine = 0.59.  eICU Interventions  Plan: 1. Continue present ventilator management.  2. Replace K+.      Intervention Category Major Interventions: Respiratory failure - evaluation and management;Electrolyte abnormality - evaluation and management  Karizma Cheek [place] 12/23/2019, 10:43 PM

## 2019-12-23 NOTE — Progress Notes (Signed)
Pharmacy Antibiotic Note  Alyssa Carey is a 49 y.o. female admitted on 12/21/2019 with wound infection.  Pharmacy has been consulted for vancomycin dosing.  Pt is 78yoF with PMH significant for EtOH abuse. Pt admitted with multiple stage II wounds on left buttock.  Today, 12/23/19 -WBC 15.8, elevated & increasing -SCr 0.84, CrCl ~95 mL/min -Afebrile -Lactate: 2.9 > 3.3  Plan:  Vancomycin 2000 mg LD followed by 1000 mg IV q8h  Goal VT 15-20 mcg/mL  Ceftriaxone 2 g IV daily per MD  Follow renal function, culture data. Check VT once at steady state if indicated  Height: 5' 4"  (162.6 cm) Weight: 97 kg (213 lb 13.5 oz) IBW/kg (Calculated) : 54.7  Temp (24hrs), Avg:98 F (36.7 C), Min:97.6 F (36.4 C), Max:98.7 F (37.1 C)  Recent Labs  Lab 12/21/19 1448 12/21/19 1529 12/21/19 1658 12/21/19 2231 12/22/19 0136 12/23/19 0140  WBC 11.8*  --   --   --  7.2 15.8*  CREATININE  --   --  1.74*  --  1.32* 0.84  LATICACIDVEN  --  3.6*  --  2.9*  --  3.3*    Estimated Creatinine Clearance: 94.6 mL/min (by C-G formula based on SCr of 0.84 mg/dL).    Allergies  Allergen Reactions  . Penicillins     Did it involve swelling of the face/tongue/throat, SOB, or low BP? N Did it involve sudden or severe rash/hives, skin peeling, or any reaction on the inside of your mouth or nose? Y Did you need to seek medical attention at a hospital or doctor's office? N When did it last happen?Childhood If all above answers are "NO", may proceed with cephalosporin use.     Antimicrobials this admission: ceftriaxone 6/8 >>  vancomycin 6/8 >>   Dose adjustments this admission:  Microbiology results: 6/8 BCx: ngtd 6/8 UCx: ngtd   Thank you for allowing pharmacy to be a part of this patient's care.  Lenis Noon, PharmD 12/23/2019 10:18 AM

## 2019-12-23 NOTE — Significant Event (Signed)
Rapid Response Event Note  Overview: Time Called: 0935 Arrival Time: 0939 Event Type: Respiratory, Neurologic  Initial Focused Assessment:  Called to room due to new onset red MEWS. On arrival patient lethargic but will awake to noxious stimuli and state name, date of birth, and president. Patient then drifts back off to sleep. Patient lab work significant for doubled since last night, and increasing lactic acid. Patient on continuous IV fluids. CBG spot checked, 116. 12 lead EKG completed due to tachycardia- showed sinus tachycardia. BP 117/98. Afbrile. HR 116.  WOC at bedside for wound check. Patient turned to view wound and tunneling. Per [place] wound has progressed from yesterday. Due to odor, location, and progression plan to get pelvic CT to rule out necrotizing fascitis. Patient placed on 2L [place]. MD notified of findings and concerns. Lab called for STAT labs. CT called to schedule scan. Plan to remain on telemetry at this time. Continue to reassess. MD to consult general surgery depending on CT findings. Bedside RN transporting patient to CT. Lab at bedside for lab collection.   Plan of Care (if not transferred):  Head CT Pelvis CT Repeat lactic Amonia level Start Vancomycin   Event Summary: Name of Physician Notified: Cyndi Bender MD at [phone]    at    Outcome: Stayed in room and stabalized  Event End Time: [place]

## 2019-12-23 NOTE — Transfer of Care (Signed)
Immediate Anesthesia Transfer of Care Note  Patient: Alyssa Carey  Procedure(s) Performed: EXCISION AND DEBRIDEMENT LEFT BUTTOCK AND PERINEUM (N/A )  Patient Location: PACU and ICU  Anesthesia Type:General  Level of Consciousness: Patient remains intubated per anesthesia plan  Airway & Oxygen Therapy: Patient remains intubated per anesthesia plan  Post-op Assessment: Report given to RN  Post vital signs: stable  Last Vitals:  Vitals Value Taken Time  BP 102/80 12/23/19 2130  Temp    Pulse 83 12/23/19 2131  Resp 20 12/23/19 2130  SpO2 100 % 12/23/19 2131  Vitals shown include unvalidated device data.  Last Pain:  Vitals:   12/23/19 1732  TempSrc: Oral  PainSc:       Patients Stated Pain Goal: 2 (45/84/83 5075)  Complications: No apparent anesthesia complications

## 2019-12-23 NOTE — Progress Notes (Signed)
Patient ID: Alyssa Carey, female   DOB: 11-18-1970, 49 y.o.   MRN: [id number] CT of the pelvis with contrast is showing probable gas-forming infection.  As spoken to Dr. Chrisandra Netters surgery on phone regarding the patient.  Patient has been made n.p.o.  I will add clindamycin IV along with Rocephin, Flagyl and vancomycin.  Type and screen.  I have spoken to Dr. Cannon Kettle and patient will be moved to ICU.  I have spoken to son/Dantere on phone and updated him as well.

## 2019-12-23 NOTE — Progress Notes (Signed)
Nurse Tech Marilynn Rail notified me of patient VS that triggered red mews. She informed me that charge nurse Nonie Hoyer was aware. I called rapid response nurse Zoe Suggs and notified Dr. Starla Link by [facility] page and he called me right back (see new orders). Wound care nurse Dawn came up and assessed tunneling wound. Rapid response nurse Zoe requested that we place patient on [place] 2L due to tachypnea.    VS  T98.7 BP 118/88 HR 115 RR 26 O2 99% on room air.

## 2019-12-23 NOTE — Anesthesia Preprocedure Evaluation (Addendum)
Anesthesia Evaluation  Patient identified by MRN, date of birth, ID band Patient confused    Reviewed: Allergy & Precautions, NPO status , Patient's Chart, lab work & pertinent test results, Unable to perform ROS - Chart review only  History of Anesthesia Complications Negative for: history of anesthetic complications  Airway       Comment:  Unable to complete airway exam due to mental status  Dental  (+) Dental Advisory Given   Pulmonary Current Smoker and Patient abstained from smoking.,    breath sounds clear to auscultation + decreased breath sounds      Cardiovascular  Rhythm:Regular Rate:Tachycardia     Neuro/Psych PSYCHIATRIC DISORDERS    GI/Hepatic (+) Cirrhosis     substance abuse  alcohol use,   Endo/Other    Renal/GU      Musculoskeletal   Abdominal   Peds  Hematology  Thrombocytopenia - Plt 32k INR 1.3    Anesthesia Other Findings Altered mental status, hepatic encephalopathy Necrotizing fasciitis Covid neg 6/6   Reproductive/Obstetrics                            Anesthesia Physical Anesthesia Plan  ASA: IV and emergent  Anesthesia Plan: General   Post-op Pain Management:    Induction: Intravenous  PONV Risk Score and Plan: 3 and Treatment may vary due to age or medical condition and Ondansetron  Airway Management Planned: Oral ETT  Additional Equipment: Arterial line, CVP and Ultrasound Guidance Line Placement  Intra-op Plan:   Post-operative Plan: Possible Post-op intubation/ventilation  Informed Consent: I have reviewed the patients History and Physical, chart, labs and discussed the procedure including the risks, benefits and alternatives for the proposed anesthesia with the patient or authorized representative who has indicated his/her understanding and acceptance.     History available from chart only, Consent reviewed with POA and Dental advisory  given  Plan Discussed with: CRNA, Anesthesiologist and Surgeon  Anesthesia Plan Comments: (History received and consent obtained from son, Deantray(sp?) Berline Lopes, via telephone)     Anesthesia Quick Evaluation

## 2019-12-23 NOTE — Progress Notes (Signed)
AMION page sent to Dr. Starla Link to call about patient.  Dr. Starla Link called. Requested a foley due to patient wounds and getting 900 cc out during in and out cath for urine culture. Asked him if lactic acid needed to be repeated he said he doesn't need a new lactic.

## 2019-12-23 NOTE — Consult Note (Signed)
NAME:  Alyssa Carey, MRN:  [id number], DOB:  Apr 02, 1971, LOS: 2 ADMISSION DATE:  12/21/2019, CONSULTATION DATE:  6/8 REFERRING MD:  Kshitiz, CHIEF COMPLAINT:  Confusion   Brief History   49 y/o female with a heavy alcohol history admitted in the setting of confusion and presumed alcohol withdrawal who later developed sepsis from a gas forming wound.    History of present illness   The patient was encephalopathic and unable to provide history. Her son says that she has been drinking heavily for many years and has been a very private person over the years.  He says that she was confused, he was previously unaware of the wounds.  Se was admitted here two days ago confused.  GI was consulted due to alcoholic hepatitis and hepatic encephalopathy. An ultrasound of her abdomen showed hepatic steatosis.  She was treated with the CIWA protocol her as well as lactulose.  She was noted on admission to have several buttock wounds.  Today she became more confused and was tachycardic.  A CT scan of her abdomen/pelvis showed sub cutaneous gas.  PCCM was consulted as well as general surgery.   Past Medical History  Alcohol abuse  Significant Hospital Events   6/6 admission 6/8 ICU admission   Consults:  General surgery PCCM  Procedures:    Significant Diagnostic Tests:  6/6 CT head > NAICP 6/7 U/S abdomen > hepatic steatosis 6/8 CT Pelvis > gas throughout medial subcutaneous soft tissues of left buttock into the left perineum  Micro Data:  6/6 SARS COV 2 >  6/6 blood culture >  6/8 urine >  6/8 blood >   Antimicrobials:  6/7 ceftriaxone >  6/8 vanc >  6/8 flagyl >  6/8 clinda >   Interim history/subjective:  As above  Objective   Blood pressure 103/72, pulse (!) 119, temperature 97.8 F (36.6 C), temperature source Oral, resp. rate (!) 34, height 5' 4"  (1.626 m), weight 97 kg, last menstrual period 03/23/2019, SpO2 100 %.        Intake/Output Summary (Last 24 hours) at 12/23/2019  1234 Last data filed at 12/23/2019 1223 Gross per 24 hour  Intake 2601.93 ml  Output 900 ml  Net 1701.93 ml   Filed Weights   12/23/19 0522  Weight: 97 kg    Examination:  General:  Lying in bed, tachypnea but no accessory muscle use HENT: NCAT OP clear PULM: CTA B, normal effort CV: RRR, no mgr, 2+ radial pulses GI: BS+, soft, nontender MSK: normal bulk and tone Neuro: very drowsy, wakes to voice, sternal rub and will open her eyes and nod head appropriately, otherwise doesn't follow commands   Resolved Hospital Problem list     Assessment & Plan:  Severe sepsis in setting of gas forming skin and soft tissue infection present on admission: Lactic acid improving > agree with general surgery consult, may need to go to OR > move to ICU now > continue IV fluids at current rate as you are doing > continue vanc, ceftriaxone, clindamycin > hold flagyl for now > monitor hemodynamics in ICU setting  Hepatic encephalopathy due to hepatic, alcoholic steatosis> worsening in setting of sepsis > d/c CIWA protocol  > continue thiamine/folate  Hyperglycemia:  > monitor SSI, goal glucose 140-180  Alcohol abuse: > counsel to quit  Thrombocytopenia due to alcohol abuse: > may need transfusion if goes to surgery  Alcoholic Hepatitis: improving > monitor LFTs periodically  Code status: for now full code.  We had a lengthy   Best practice:   Per [facility]  Labs   CBC: Recent Labs  Lab 12/21/19 1448 12/22/19 0136 12/23/19 0140 12/23/19 1039  WBC 11.8* 7.2 15.8* 15.6*  NEUTROABS  --   --  14.3* 13.6*  HGB 14.5 12.5 13.1 12.4  HCT 45.0 37.0 37.2 35.5*  MCV 100.2* 94.1 93.7 93.9  PLT 67* 47* 31* 32*    Basic Metabolic Panel: Recent Labs  Lab 12/21/19 1658 12/22/19 0136 12/23/19 0140 12/23/19 1039  NA 131* 133* 134* 137  K 4.4 3.5 4.0 3.4*  CL 97* 102 105 106  CO2 19* 16* 16* 19*  GLUCOSE 110* 126* 171* 136*  BUN 35* 39* 33* 34*  CREATININE 1.74* 1.32* 0.84 0.75    CALCIUM 8.2* 7.9* 8.1* 8.1*  MG  --  2.3 2.0  --   PHOS  --  3.6  --   --    GFR: Estimated Creatinine Clearance: 99.3 mL/min (by C-G formula based on SCr of 0.75 mg/dL). Recent Labs  Lab 12/21/19 1448 12/21/19 1529 12/21/19 2231 12/22/19 0136 12/23/19 0140 12/23/19 1039  WBC 11.8*  --   --  7.2 15.8* 15.6*  LATICACIDVEN  --  3.6* 2.9*  --  3.3* 2.3*    Liver Function Tests: Recent Labs  Lab 12/21/19 1658 12/22/19 0136 12/23/19 0140 12/23/19 1039  AST 199* 207* 159* 123*  ALT 58* 58* 68* 62*  ALKPHOS 242* 240* 311* 318*  BILITOT 3.5* 3.3* 3.4* 2.8*  PROT 5.6* 5.3* 5.3* 5.1*  ALBUMIN 1.8* 1.8* 1.6* 1.5*   No results for input(s): LIPASE, AMYLASE in the last 168 hours. Recent Labs  Lab 12/21/19 1658 12/22/19 0136 12/23/19 1039  AMMONIA 76* 77* 76*    ABG No results found for: PHART, PCO2ART, PO2ART, HCO3, TCO2, ACIDBASEDEF, O2SAT   Coagulation Profile: Recent Labs  Lab 12/22/19 0136  INR 1.3*    Cardiac Enzymes: No results for input(s): CKTOTAL, CKMB, CKMBINDEX, TROPONINI in the last 168 hours.  HbA1C: No results found for: HGBA1C  CBG: Recent Labs  Lab 12/21/19 1521 12/23/19 0952  GLUCAP 127* 116*    Review of Systems:   Cannot obtain due to confusion   Past Medical History  She,  has no past medical history on file.   Surgical History   History reviewed. No pertinent surgical history.   Social History   reports that she has been smoking. She does not have any smokeless tobacco history on file. She reports current alcohol use. She reports previous drug use.   Family History   Her family history is not on file.   Allergies Allergies  Allergen Reactions  . Penicillins     Did it involve swelling of the face/tongue/throat, SOB, or low BP? N Did it involve sudden or severe rash/hives, skin peeling, or any reaction on the inside of your mouth or nose? Y Did you need to seek medical attention at a hospital or doctor's office? N When  did it last happen?Childhood If all above answers are "NO", may proceed with cephalosporin use.      Home Medications  Prior to Admission medications   Medication Sig Start Date End Date Taking? Authorizing Provider  diphenhydramine-acetaminophen (TYLENOL PM) 25-500 MG TABS tablet Take 1 tablet by mouth at bedtime as needed (pain).   Yes [provider]  ibuprofen (ADVIL) 200 MG tablet Take 200 mg by mouth every 6 (six) hours as needed for moderate pain.   Yes [provider]     Critical care time: 32 minutes     Roselie Awkward, MD [facility] [facility] Pager: [phone] Cell: [phone] If no response, call [phone]

## 2019-12-23 NOTE — Op Note (Addendum)
Operative Note  Alyssa Carey  [id number]  [id number]  12/23/2019   Surgeon: Vikki Ports A ConnorMD  Procedure performed: incision and debridement of skin, subcutaneous tissue, fascia and muscle 15x15x6cm, left buttock  Preop diagnosis: necrotizing fasciitis Post-op diagnosis/intraop findings: same  Specimens: left buttock tissue, cultures Retained items: Kerlix x 3 EBL: 189QM Complications: none Expected return to the OR: Yes, patient will need second look, possibly further debridement, and possibly diverting colostomy.  Description of procedure: After obtaining informed consent the patient was taken to the operating room wheregeneral endotracheal anesthesia was initiated, SCDs applied, and a formal timeout was performed. She is on appropriate antibiotics for the infection which brought [place] to the operating room.  A right internal jugular central line and a left radial A-line were placed by Dr. Fransisco Beau.  The patient was then positioned prone on the operating room table with all pressure points appropriately padded.  The buttocks and perianal region were prepped and draped in usual sterile fashion.  Cautery was used to excise the eschar along the left buttock.  Immediately encountered purulence and frankly necrotic and gangrenous tissue.  This was cultured.  Mayo scissors and blunt finger fracture dissection were then employed to debride all necrotic tissue.  There is an open wound just to the left of the anus which communicates with the anus and communicates with the larger area of necrotic tissue in the left buttock.  This was connected with the larger area of debridement.  The wound tracks along the rectum on the left side although by palpation the rectum feels to be intact.  The wound also tracks in the soft tissues laterally, and superiorly beneath the sacrum.  The ischial tuberosity is palpable at the base of the wound though bone is not frankly exposed.  We continued debriding until there  was briskly bleeding tissue throughout the wound.  Hemostasis was ensured with cautery.  The wound was then packed with 3 saline moistened Kerlix, a dry dressing and mesh undergarment were applied.  The patient was then returned to the supine position and taken back to the ICU intubated and in guarded condition.   All counts were correct at the completion of the case.   I updated her son Kambri Dismore [phone]) in the waiting room and her father Shanon Brow [phone]) by phone of the findings above.

## 2019-12-24 ENCOUNTER — Encounter: Payer: Self-pay | Admitting: *Deleted

## 2019-12-24 LAB — CBC WITH DIFFERENTIAL/PLATELET
Abs Immature Granulocytes: 1.03 10*3/uL — ABNORMAL HIGH (ref 0.00–0.07)
Basophils Absolute: 0.1 10*3/uL (ref 0.0–0.1)
Basophils Relative: 1 %
Eosinophils Absolute: 0.1 10*3/uL (ref 0.0–0.5)
Eosinophils Relative: 1 %
HCT: 29.9 % — ABNORMAL LOW (ref 36.0–46.0)
Hemoglobin: 10.3 g/dL — ABNORMAL LOW (ref 12.0–15.0)
Immature Granulocytes: 5 %
Lymphocytes Relative: 7 %
Lymphs Abs: 1.5 10*3/uL (ref 0.7–4.0)
MCH: 32.3 pg (ref 26.0–34.0)
MCHC: 34.4 g/dL (ref 30.0–36.0)
MCV: 93.7 fL (ref 80.0–100.0)
Monocytes Absolute: 0.5 10*3/uL (ref 0.1–1.0)
Monocytes Relative: 2 %
Neutro Abs: 17.9 10*3/uL — ABNORMAL HIGH (ref 1.7–7.7)
Neutrophils Relative %: 84 %
Platelets: 97 10*3/uL — ABNORMAL LOW (ref 150–400)
RBC: 3.19 MIL/uL — ABNORMAL LOW (ref 3.87–5.11)
RDW: 15 % (ref 11.5–15.5)
WBC: 21.2 10*3/uL — ABNORMAL HIGH (ref 4.0–10.5)
nRBC: 0.1 % (ref 0.0–0.2)

## 2019-12-24 LAB — BPAM PLATELET PHERESIS
Blood Product Expiration Date: 202106102359
Blood Product Expiration Date: 202106102359
Blood Product Expiration Date: 202106102359
ISSUE DATE / TIME: 202106081649
ISSUE DATE / TIME: 202106081649
ISSUE DATE / TIME: 202106081946
Unit Type and Rh: 5100
Unit Type and Rh: 600
Unit Type and Rh: 8400

## 2019-12-24 LAB — COMPREHENSIVE METABOLIC PANEL
ALT: 46 U/L — ABNORMAL HIGH (ref 0–44)
AST: 64 U/L — ABNORMAL HIGH (ref 15–41)
Albumin: 1.4 g/dL — ABNORMAL LOW (ref 3.5–5.0)
Alkaline Phosphatase: 264 U/L — ABNORMAL HIGH (ref 38–126)
Anion gap: 12 (ref 5–15)
BUN: 26 mg/dL — ABNORMAL HIGH (ref 6–20)
CO2: 13 mmol/L — ABNORMAL LOW (ref 22–32)
Calcium: 7 mg/dL — ABNORMAL LOW (ref 8.9–10.3)
Chloride: 112 mmol/L — ABNORMAL HIGH (ref 98–111)
Creatinine, Ser: 0.68 mg/dL (ref 0.44–1.00)
GFR calc Af Amer: >60 mL/min (ref >60)
GFR calc non Af Amer: >60 mL/min (ref >60)
Glucose, Bld: 171 mg/dL — ABNORMAL HIGH (ref 70–99)
Potassium: 3.4 mmol/L — ABNORMAL LOW (ref 3.5–5.1)
Sodium: 137 mmol/L (ref 135–145)
Total Bilirubin: 2.1 mg/dL — ABNORMAL HIGH (ref 0.3–1.2)
Total Protein: 4.6 g/dL — ABNORMAL LOW (ref 6.5–8.1)

## 2019-12-24 LAB — PREPARE PLATELET PHERESIS
Unit division: 0
Unit division: 0
Unit division: 0

## 2019-12-24 LAB — HEMOGLOBIN A1C
Hgb A1c MFr Bld: 5.7 % — ABNORMAL HIGH (ref 4.8–5.6)
Mean Plasma Glucose: 116.89 mg/dL

## 2019-12-24 LAB — BLOOD GAS, ARTERIAL
Acid-base deficit: 9.4 mmol/L — ABNORMAL HIGH (ref 0.0–2.0)
Bicarbonate: 14.1 mmol/L — ABNORMAL LOW (ref 20.0–28.0)
FIO2: 40
O2 Saturation: 99.5 %
Patient temperature: 98.6
pCO2 arterial: 24.5 mmHg — ABNORMAL LOW (ref 32.0–48.0)
pH, Arterial: 7.379 (ref 7.350–7.450)
pO2, Arterial: 189 mmHg — ABNORMAL HIGH (ref 83.0–108.0)

## 2019-12-24 LAB — PREALBUMIN: Prealbumin: <5 mg/dL — ABNORMAL LOW (ref 18–38)

## 2019-12-24 LAB — GLUCOSE, CAPILLARY
Glucose-Capillary: 146 mg/dL — ABNORMAL HIGH (ref 70–99)
Glucose-Capillary: 151 mg/dL — ABNORMAL HIGH (ref 70–99)
Glucose-Capillary: 153 mg/dL — ABNORMAL HIGH (ref 70–99)
Glucose-Capillary: 175 mg/dL — ABNORMAL HIGH (ref 70–99)

## 2019-12-24 LAB — MAGNESIUM
Magnesium: 1.7 mg/dL (ref 1.7–2.4)
Magnesium: 1.7 mg/dL (ref 1.7–2.4)
Magnesium: 2.2 mg/dL (ref 1.7–2.4)

## 2019-12-24 LAB — LACTIC ACID, PLASMA
Lactic Acid, Venous: 2.3 mmol/L (ref 0.5–1.9)
Lactic Acid, Venous: 2.1 mmol/L (ref 0.5–1.9)

## 2019-12-24 LAB — TRIGLYCERIDES: Triglycerides: 90 mg/dL (ref <150)

## 2019-12-24 LAB — PHOSPHORUS
Phosphorus: 3.9 mg/dL (ref 2.5–4.6)
Phosphorus: 3 mg/dL (ref 2.5–4.6)

## 2019-12-24 LAB — AMMONIA: Ammonia: 64 umol/L — ABNORMAL HIGH (ref 9–35)

## 2019-12-24 MED ORDER — VITAL HIGH PROTEIN PO LIQD
1000.0000 mL | ORAL | Status: DC
  Administered 2019-12-24: 1000 mL

## 2019-12-24 MED ORDER — FOLIC ACID 1 MG PO TABS
1.0000 mg | ORAL_TABLET | Freq: Every day | ORAL | Status: DC
  Administered 2019-12-24 – 2020-01-07 (×14): 1 mg
  Filled 2019-12-24 (×15): qty 1

## 2019-12-24 MED ORDER — LACTATED RINGERS IV SOLN: INTRAVENOUS | Status: DC

## 2019-12-24 MED ORDER — ZINC SULFATE 220 (50 ZN) MG PO CAPS
220.0000 mg | ORAL_CAPSULE | Freq: Every day | ORAL | Status: DC
  Administered 2019-12-24 – 2020-01-07 (×14): 220 mg
  Filled 2019-12-24 (×15): qty 1

## 2019-12-24 MED ORDER — PANTOPRAZOLE SODIUM 40 MG IV SOLR: 40.0000 mg | Freq: Two times a day (BID) | INTRAVENOUS | Status: DC

## 2019-12-24 MED ORDER — PRO-STAT SUGAR FREE PO LIQD
30.0000 mL | Freq: Two times a day (BID) | ORAL | Status: DC
  Administered 2019-12-24: 30 mL
  Filled 2019-12-24: qty 30

## 2019-12-24 MED ORDER — ADULT MULTIVITAMIN LIQUID CH
15.0000 mL | Freq: Every day | ORAL | Status: DC
  Administered 2019-12-24 – 2019-12-30 (×6): 15 mL
  Filled 2019-12-24 (×7): qty 15

## 2019-12-24 MED ORDER — INSULIN ASPART 100 UNIT/ML ~~LOC~~ SOLN
0.0000 [IU] | SUBCUTANEOUS | Status: DC
  Administered 2019-12-24 – 2020-01-10 (×33): 1 [IU] via SUBCUTANEOUS
  Administered 2020-01-13: 4 [IU] via SUBCUTANEOUS
  Administered 2020-01-18: 1 [IU] via SUBCUTANEOUS

## 2019-12-24 MED ORDER — SODIUM CHLORIDE 0.9% FLUSH
10.0000 mL | Freq: Two times a day (BID) | INTRAVENOUS | Status: DC
  Administered 2019-12-24: 10 mL
  Administered 2019-12-25: 40 mL
  Administered 2019-12-25 – 2020-01-03 (×16): 10 mL
  Administered 2020-01-03: 20 mL
  Administered 2020-01-04: 10 mL
  Administered 2020-01-04: 20 mL
  Administered 2020-01-05: 10 mL
  Administered 2020-01-05: 20 mL
  Administered 2020-01-06: 10 mL
  Administered 2020-01-06 – 2020-01-07 (×2): 30 mL
  Administered 2020-01-07 – 2020-01-08 (×2): 10 mL
  Administered 2020-01-08: 30 mL
  Administered 2020-01-09: 10 mL
  Administered 2020-01-09: 30 mL
  Administered 2020-01-10 – 2020-01-16 (×14): 10 mL
  Administered 2020-01-17: 30 mL
  Administered 2020-01-17: 10 mL
  Administered 2020-01-18: 20 mL
  Administered 2020-01-18 – 2020-01-20 (×5): 10 mL
  Administered 2020-01-21: 30 mL
  Administered 2020-01-21 – 2020-01-22 (×2): 10 mL
  Administered 2020-01-22: 30 mL
  Administered 2020-01-26: 10 mL
  Administered 2020-01-26: 30 mL
  Administered 2020-01-27 – 2020-01-30 (×4): 10 mL

## 2019-12-24 MED ORDER — PRO-STAT SUGAR FREE PO LIQD
60.0000 mL | Freq: Four times a day (QID) | ORAL | Status: DC
  Administered 2019-12-24 – 2019-12-30 (×21): 60 mL
  Filled 2019-12-24 (×22): qty 60

## 2019-12-24 MED ORDER — PHENYLEPHRINE CONCENTRATED 100MG/250ML (0.4 MG/ML) INFUSION SIMPLE
0.0000 ug/min | INTRAVENOUS | Status: DC
  Administered 2019-12-24: 200 ug/min via INTRAVENOUS
  Administered 2019-12-24: 150 ug/min via INTRAVENOUS
  Administered 2019-12-25: 200 ug/min via INTRAVENOUS
  Administered 2019-12-25: 100 ug/min via INTRAVENOUS
  Administered 2019-12-26: 150 ug/min via INTRAVENOUS
  Administered 2019-12-26: 70 ug/min via INTRAVENOUS
  Administered 2019-12-29 – 2019-12-31 (×2): 50 ug/min via INTRAVENOUS
  Administered 2020-01-01: 55 ug/min via INTRAVENOUS
  Filled 2019-12-24 (×14): qty 250

## 2019-12-24 MED ORDER — PANTOPRAZOLE SODIUM 40 MG PO PACK
40.0000 mg | PACK | Freq: Two times a day (BID) | ORAL | Status: DC
  Administered 2019-12-24 – 2020-01-07 (×28): 40 mg
  Filled 2019-12-24 (×28): qty 20

## 2019-12-24 MED ORDER — MAGNESIUM SULFATE 2 GM/50ML IV SOLN
2.0000 g | Freq: Once | INTRAVENOUS | Status: AC
  Administered 2019-12-24: 2 g via INTRAVENOUS
  Filled 2019-12-24: qty 50

## 2019-12-24 MED ORDER — THIAMINE HCL 100 MG PO TABS
100.0000 mg | ORAL_TABLET | Freq: Every day | ORAL | Status: DC
  Administered 2019-12-24 – 2020-01-07 (×14): 100 mg
  Filled 2019-12-24 (×15): qty 1

## 2019-12-24 MED ORDER — SODIUM CHLORIDE 0.9% FLUSH: 10.0000 mL | INTRAVENOUS | Status: DC | PRN

## 2019-12-24 MED ORDER — POTASSIUM CHLORIDE 20 MEQ/15ML (10%) PO SOLN
40.0000 meq | Freq: Once | ORAL | Status: AC
  Administered 2019-12-24: 40 meq
  Filled 2019-12-24: qty 30

## 2019-12-24 MED ORDER — LACTATED RINGERS IV BOLUS
1000.0000 mL | Freq: Once | INTRAVENOUS | Status: AC
  Administered 2019-12-24: 1000 mL via INTRAVENOUS

## 2019-12-24 MED ORDER — VITAMIN C 500 MG/5ML PO SYRP
500.0000 mg | ORAL_SOLUTION | Freq: Two times a day (BID) | ORAL | Status: DC
  Administered 2019-12-24 – 2020-01-07 (×28): 500 mg
  Filled 2019-12-24 (×31): qty 5

## 2019-12-24 MED ORDER — PROPOFOL 1000 MG/100ML IV EMUL
INTRAVENOUS | Status: AC
  Filled 2019-12-24: qty 100

## 2019-12-24 NOTE — Progress Notes (Signed)
[facility] Progress Note  1 Day Post-Op  Subjective: CC:  Intubated, sedation.  Responding to noxious stimuli.  Remains on low dose phenylephrine   R IJ and A-line placed in OR   Objective: Vital signs in last 24 hours: Temp:  [97.8 F (36.6 C)-99.8 F (37.7 C)] 99.6 F (37.6 C) (06/09 0800) Pulse Rate:  [54-125] 102 (06/09 0800) Resp:  [19-38] 27 (06/09 0800) BP: (77-118)/(42-95) 102/60 (06/09 0800) SpO2:  [95 %-100 %] 100 % (06/09 0820) Arterial Line BP: (78-117)/(42-66) 96/52 (06/09 0800) FiO2 (%):  [40 %-60 %] 40 % (06/09 0820) Weight:  [87.5 kg-94.4 kg] 94.4 kg (06/09 0500) Last BM Date: 12/22/19  Intake/Output from previous day: 06/08 0701 - 06/09 0700 In: 7701.1 [I.V.:5249; Blood:937; IV Piggyback:1115.1] Out: 585 [Urine:335; Blood:250] Intake/Output this shift: No intake/output data recorded.  PE: Constitutional: somnolent but arouses to loud voice, tachypnea, no deformities Eyes: Moist conjunctiva;  anicteric; PERRL Neck: Trachea midline; no thyromegaly Lungs: Normal respiratory effort; no tactile fremitus CV: RRR; no palpable thrills; no pitting edema GI: Abd soft, non-tender, no palpable hepatosplenomegaly GU: wound with dressing in place, not examined MSK: symmetrical, no clubbing/cyanosis Neuro: somnolent, arouses to loud voice and noxious stimuli, no consistently  Following commands Lymphatic: No palpable cervical or axillary lymphadenopathy  Lab Results:  Recent Labs    12/23/19 2145 12/24/19 0600  WBC 16.9* 21.2*  HGB 10.8* 10.3*  HCT 30.4* 29.9*  PLT 92* 97*   BMET Recent Labs    12/23/19 2145 12/24/19 0600  NA 139 137  K 2.4* 3.4*  CL 113* 112*  CO2 16* 13*  GLUCOSE 118* 171*  BUN 27* 26*  CREATININE 0.59 0.68  CALCIUM 7.0* 7.0*   PT/INR Recent Labs    12/23/19 1221 12/23/19 2145  LABPROT 15.4* 16.2*  INR 1.3* 1.4*   CMP     Component Value Date/Time   NA 137 12/24/2019 0600   K 3.4 (L) 12/24/2019 0600    CL 112 (H) 12/24/2019 0600   CO2 13 (L) 12/24/2019 0600   GLUCOSE 171 (H) 12/24/2019 0600   BUN 26 (H) 12/24/2019 0600   CREATININE 0.68 12/24/2019 0600   CALCIUM 7.0 (L) 12/24/2019 0600   PROT 4.6 (L) 12/24/2019 0600   ALBUMIN 1.4 (L) 12/24/2019 0600   AST 64 (H) 12/24/2019 0600   ALT 46 (H) 12/24/2019 0600   ALKPHOS 264 (H) 12/24/2019 0600   BILITOT 2.1 (H) 12/24/2019 0600   GFRNONAA >60 12/24/2019 0600   GFRAA >60 12/24/2019 0600   Lipase  No results found for: LIPASE     Studies/Results: CT HEAD WO CONTRAST  Result Date: 12/23/2019 CLINICAL DATA:  Altered mental status. EXAM: CT HEAD WITHOUT CONTRAST TECHNIQUE: Contiguous axial images were obtained from the base of the skull through the vertex without intravenous contrast. COMPARISON:  Head CT 12/21/2019. FINDINGS: Brain: No evidence of acute infarction, hemorrhage, hydrocephalus, extra-axial collection or mass lesion/mass effect. Vascular: No hyperdense vessel or unexpected calcification. Skull: Intact.  No focal lesion. Sinuses/Orbits: Negative. Other: None. IMPRESSION: Negative head CT. Electronically Signed   By: Inge Rise M.D.   On: 12/23/2019 13:57   CT PELVIS W CONTRAST  Result Date: 12/23/2019 CLINICAL DATA:  Sacral wounds, question infection EXAM: CT PELVIS WITH CONTRAST TECHNIQUE: Multidetector CT imaging of the pelvis was performed using the standard protocol following the bolus administration of intravenous contrast. CONTRAST:  168m OMNIPAQUE IOHEXOL 300 MG/ML  SOLN COMPARISON:  None. FINDINGS: Urinary Tract: No abnormality visualized. Foley  catheter in place. Urinary bladder decompressed. Bowel:  Unremarkable visualized pelvic bowel loops. Vascular/Lymphatic: No pathologically enlarged lymph nodes. No significant vascular abnormality seen. Reproductive:  No mass or other significant abnormality Other: There is gas noted throughout the subcutaneous soft tissues of the left buttock, extending into the left perineum  concerning for infection. No drainable fluid collections. No acute bony abnormality. Musculoskeletal: No acute bony abnormality. IMPRESSION: Gas throughout the medial subcutaneous soft tissues of the left buttock extending into the left perineum concerning for gas-forming infection. Electronically Signed   By: Rolm Baptise M.D.   On: 12/23/2019 11:46   US Abdomen Complete  Result Date: 12/22/2019 CLINICAL DATA:  Elevated liver enzymes EXAM: ABDOMEN ULTRASOUND COMPLETE COMPARISON:  None. FINDINGS: Gallbladder: No gallstones or wall thickening visualized. There is no pericholecystic fluid. No sonographic Murphy sign noted by sonographer. Common bile duct: Diameter: 4 mm. No intrahepatic, common hepatic, or common bile duct dilatation. Liver: No focal lesion identified. Liver echogenicity is increased diffusely. Portal vein is patent on color Doppler imaging with normal direction of blood flow towards the liver. IVC: No abnormality visualized. Pancreas: No pancreatic mass or inflammatory focus. Spleen: Size and appearance within normal limits. Right Kidney: Length: 11.5 cm. Echogenicity within normal limits. No mass or hydronephrosis visualized. Left Kidney: Length: 11.2 cm. Echogenicity within normal limits. No mass or hydronephrosis visualized. Abdominal aorta: No aneurysm visualized. Other findings: No demonstrable ascites. IMPRESSION: 1. Diffuse increase in liver echogenicity, a finding indicative of hepatic steatosis. While no focal liver lesions are evident on this study, it must be cautioned that the sensitivity of ultrasound for detection of focal liver lesions is diminished in this circumstance. 2.  Study otherwise unremarkable. Electronically Signed   By: Lowella Grip III M.D.   On: 12/22/2019 20:09   DG CHEST PORT 1 VIEW  Result Date: 12/23/2019 CLINICAL DATA:  Central line and enteric catheter placement EXAM: PORTABLE CHEST 1 VIEW COMPARISON:  12/23/2019 FINDINGS: Frontal view of the chest and  upper abdomen was performed, excluding the lung apices by collimation. Endotracheal tube overlies tracheal air column tip well above carina. Enteric catheter passes below diaphragm tip and side port overlying gastric body. There is a central venous catheter overlying the superior vena cava. Cardiac silhouette is enlarged. Lung volumes are diminished with crowding of the central vasculature. No pneumothorax or significant effusion. IMPRESSION: 1. Support devices as above. 2. Low lung volumes. Electronically Signed   By: Randa Ngo M.D.   On: 12/23/2019 22:02   DG CHEST PORT 1 VIEW  Result Date: 12/23/2019 CLINICAL DATA:  Dyspnea EXAM: PORTABLE CHEST 1 VIEW COMPARISON:  None. FINDINGS: Cardiomegaly. Low volume chest with streaky density on both sides. No Kerley lines, effusion, or pneumothorax. IMPRESSION: Low volume chest with bilateral streaky opacity likely reflecting atelectasis. Cardiomegaly. Electronically Signed   By: Monte Fantasia M.D.   On: 12/23/2019 10:42    Anti-infectives: Anti-infectives (From admission, onward)   Start     Dose/Rate Route Frequency Ordered Stop   12/23/19 2000  vancomycin (VANCOCIN) IVPB 1000 mg/200 mL premix     1,000 mg 200 mL/hr over 60 Minutes Intravenous Every 8 hours 12/23/19 1027     12/23/19 1300  clindamycin (CLEOCIN) IVPB 900 mg     900 mg 100 mL/hr over 30 Minutes Intravenous Every 8 hours 12/23/19 1205     12/23/19 1200  metroNIDAZOLE (FLAGYL) IVPB 500 mg  Status:  Discontinued     500 mg 100 mL/hr over 60 Minutes Intravenous  Every 8 hours 12/23/19 1135 12/23/19 1254   12/23/19 1045  vancomycin (VANCOREADY) IVPB 2000 mg/400 mL     2,000 mg 200 mL/hr over 120 Minutes Intravenous NOW 12/23/19 1018 12/23/19 1441   12/23/19 0300  cefTRIAXone (ROCEPHIN) 2 g in sodium chloride 0.9 % 100 mL IVPB     2 g 200 mL/hr over 30 Minutes Intravenous Every 24 hours 12/23/19 0236       Assessment/Plan Hepatic encephalopathy Alcohol abuse Thrombocytopenia   HTN  Sepsis  Necrotizing fasciitis of left buttock  S/P incision and debridement of skin, subcutaneous tissue, fascia and muscle 15x15x6cm, left buttock 5/8 Dr. Romana Juniper  - ongoing management of respiratory failure and septic shock per CCM, appreciate their care - Continue IV abx  - plan to return to the OR tomorrow 6/10 for irrigation and debridement, will discuss potential diverting colostomy with MD. - agree with TF, please hold at MN for OR tomorrow   FEN: NPO, IVF, start tube feeds, NPO at MN  ID: Rocephin, Clindamycin, Vancomycin VTE: SCD's Foley: placed 6/8    LOS: 3 days    Obie Dredge, [facility] [address] [facility] Please see Amion for pager number during day hours 7:00am-4:30pm

## 2019-12-24 NOTE — Progress Notes (Addendum)
NAME:  Alyssa Carey, MRN:  [id number], DOB:  [birth year], LOS: 3 ADMISSION DATE:  12/21/2019, CONSULTATION DATE:  6/8 REFERRING MD:  Kshitiz, CHIEF COMPLAINT:  Confusion   Brief History   49 y/o female with a heavy alcohol history admitted in the setting of confusion and presumed alcohol withdrawal who later developed sepsis from a gas forming wound.    Past Medical History  Alcohol abuse  Significant Hospital Events   6/6 admission 6/8 ICU admission to OR ofr I&D, back intubated and on pressors,  Consults:  General surgery PCCM  Procedures:   oett 6/8>>> Right IJ CVL 6/8>>> Radial aline left 6/8>>  Significant Diagnostic Tests:  6/6 CT head > NAICP 6/7 U/S abdomen > hepatic steatosis 6/8 CT Pelvis > gas throughout medial subcutaneous soft tissues of left buttock into the left perineum  Micro Data:  6/6 SARS COV 2 > neg 6/6 blood culture >  6/8 urine > mult orgs  6/8 blood >  6/8: abd GPC,GNR,GPR>>  Antimicrobials:  6/7 ceftriaxone >  6/8 vanc >  6/8 flagyl > 6/9 6/8 clinda >   Interim history/subjective:  Sedated   Objective   Blood pressure 102/60, pulse (Abnormal) 102, temperature 99.6 F (37.6 C), temperature source Axillary, resp. rate (Abnormal) 27, height 5' 4"  (1.626 m), weight 94.4 kg, last menstrual period 03/23/2019, SpO2 100 %.    Vent Mode: PRVC FiO2 (%):  [40 %-60 %] 40 % Set Rate:  [20 bmp] 20 bmp Vt Set:  [440 mL] 440 mL PEEP:  [5 cmH20] 5 cmH20 Plateau Pressure:  [16 cmH20-20 cmH20] 16 cmH20   Intake/Output Summary (Last 24 hours) at 12/24/2019 0827 Last data filed at 12/24/2019 0400 Gross per 24 hour  Intake 7701.11 ml  Output 585 ml  Net 7116.11 ml   Filed Weights   12/23/19 0522 12/23/19 1321 12/24/19 0500  Weight: 97 kg 87.5 kg 94.4 kg    Examination:  General sedated on vent still on pressors HENT NCAT MMM no JVD orally intubated Pulm decreased bases. No accessory use. Currently Peep 5/fio2 40  Card rrr faint systolic murmur   abd not tender + bowel sounds  Ext warm, trace edema brisk CR 2+ pulses  GU clear yellow  Neuro sedated  Ext dressing intact    Resolved Hospital Problem list     Assessment & Plan:   Severe sepsis/septic shock in setting of necrotizing fasciitis (present on admission): Now status post incision and debridement of skin, subcutaneous tissue, fascia and muscle of the left buttocks 6/8 Plan Continue maintenance IV fluids Ensure central venous pressure greater than 8 Titrate phenylephrine for mean arterial pressure greater than 65 Continuing wound care with plan for return to the operating room and possibly further debridement plus/minus diverting colostomy, timing to be determined by surgical services Continue current antibiotic regimen, Currently day #3 ceftriaxone, #2 clindamycin, and #2 vancomycin Await pending culture data  Acute hypoxic respiratory failure.  Return to intensive care on mechanical ventilation. Postintubation chest x-ray shows low volume film with bibasilar atelectasis support tubes and lines in satisfactory position Arterial blood gas reviewed demonstrating underlying metabolic acidosis with mild iatrogenic respiratory alkalosis Plan Continue full ventilator support PAD protocol, RASS goal -2 VAP bundle Repeat arterial blood gas, as hemodynamics improve and underlying metabolic derangements stabilize she is at risk for worsening respiratory alkalosis from overventilation on the ventilator Chest x-ray in a.m.  Fluid and Electrolyte imbalance: Mixed anion gap/Non-anion gap (based on low anion gap corrected albumin),  hypokalemia, hyperchloremia  Plan Change IVFs to LR Repeat LA  Hepatic encephalopathy due to hepatic, alcoholic steatosis> worsening in setting of sepsis Plan PAD protocol Continue thiamine and folate  Hyperglycemia:  Plan Continue to monitor Sliding scale insulin for capillary glucose goal Shooting for 140-180  Alcohol abuse: Plan Will  need ongoing counseling post extubation  Thrombocytopenia due to alcohol abuse: - Did receive platelets intraoperatively, holding overnight Plan Continue to trend  Alcoholic Hepatitis: improving Plan Continue to trend LFTs  Protein calorie malnutrition Plan Start tubefeeds   Code status: Full code for now, will need to continue to discuss this Best practice:  Nutrition: Start tube feeds 6/9 Sedation: Protocol initiated 6/8 PUD: Start PPI 6/9 Glycemic control: Initiate glycemic protocol 6/9 DVT: SCDs  CODE STATUS: Full code Disposition continue in intensive care.  Continue full ventilatory support, let things stabilize.  Plan for back to the operating room on 6/10   Labs   CBC: Recent Labs  Lab 12/22/19 0136 12/22/19 0136 12/23/19 0140 12/23/19 1039 12/23/19 2016 12/23/19 2145 12/24/19 0600  WBC 7.2  --  15.8* 15.6*  --  16.9* 21.2*  NEUTROABS  --   --  14.3* 13.6*  --   --  17.9*  HGB 12.5   < > 13.1 12.4 9.5* 10.8* 10.3*  HCT 37.0   < > 37.2 35.5* 28.0* 30.4* 29.9*  MCV 94.1  --  93.7 93.9  --  94.1 93.7  PLT 47*  --  31* 32*  --  92* 97*   < > = values in this interval not displayed.    Basic Metabolic Panel: Recent Labs  Lab 12/22/19 0136 12/22/19 0136 12/23/19 0140 12/23/19 1039 12/23/19 2016 12/23/19 2145 12/24/19 0600  NA 133*   < > 134* 137 145 139 137  K 3.5   < > 4.0 3.4* 2.3* 2.4* 3.4*  CL 102  --  105 106  --  113* 112*  CO2 16*  --  16* 19*  --  16* 13*  GLUCOSE 126*  --  171* 136*  --  118* 171*  BUN 39*  --  33* 34*  --  27* 26*  CREATININE 1.32*  --  0.84 0.75  --  0.59 0.68  CALCIUM 7.9*  --  8.1* 8.1*  --  7.0* 7.0*  MG 2.3  --  2.0  --   --   --  1.7  PHOS 3.6  --   --   --   --   --   --    < > = values in this interval not displayed.   GFR: Estimated Creatinine Clearance: 97.9 mL/min (by C-G formula based on SCr of 0.68 mg/dL). Recent Labs  Lab 12/21/19 1529 12/21/19 2231 12/22/19 0136 12/23/19 0140 12/23/19 1039  12/23/19 2145 12/24/19 0600  WBC  --   --    < > 15.8* 15.6* 16.9* 21.2*  LATICACIDVEN 3.6* 2.9*  --  3.3* 2.3*  --   --    < > = values in this interval not displayed.    Liver Function Tests: Recent Labs  Lab 12/22/19 0136 12/23/19 0140 12/23/19 1039 12/23/19 2145 12/24/19 0600  AST 207* 159* 123* 81* 64*  ALT 58* 68* 62* 51* 46*  ALKPHOS 240* 311* 318* 268* 264*  BILITOT 3.3* 3.4* 2.8* 2.3* 2.1*  PROT 5.3* 5.3* 5.1* 4.6* 4.6*  ALBUMIN 1.8* 1.6* 1.5* 1.4* 1.4*   No results for input(s): LIPASE, AMYLASE in the last 168  hours. Recent Labs  Lab 12/21/19 1658 12/22/19 0136 12/23/19 1039 12/24/19 0630  AMMONIA 76* 77* 76* 64*    ABG    Component Value Date/Time   PHART 7.366 12/23/2019 2152   PCO2ART 26.1 (L) 12/23/2019 2152   PO2ART 218 (H) 12/23/2019 2152   HCO3 15.0 (L) 12/23/2019 2152   TCO2 19 (L) 12/23/2019 2016   ACIDBASEDEF 9.3 (H) 12/23/2019 2152   O2SAT 99.6 12/23/2019 2152     Coagulation Profile: Recent Labs  Lab 12/22/19 0136 12/23/19 1221 12/23/19 2145  INR 1.3* 1.3* 1.4*    Cardiac Enzymes: No results for input(s): CKTOTAL, CKMB, CKMBINDEX, TROPONINI in the last 168 hours.  HbA1C: No results found for: HGBA1C  CBG: Recent Labs  Lab 12/21/19 1521 12/23/19 0952  GLUCAP 127* 116*     Critical care time:33 min     Erick Colace ACNP-BC [place] Pager # [phone] OR # [phone] if no answer

## 2019-12-24 NOTE — Progress Notes (Signed)
Nutrition Follow-up  DOCUMENTATION CODES:   Obesity unspecified  INTERVENTION:  - will adjust TF regimen: Vital High Protein @ 40 ml/hr with 60 ml prostat QID. - this regimen + kcal from current propofol rate will provide 2037 kcal (105% estimated kcal need), 204 grams protein, and 802 ml free water. - free water flush to be per CCM or CCS. - will order 15 ml multivitamin per OGT/day. - recommend 220 mg zinc sulfate once/day and 500 mg ascorbic acid BID.    NUTRITION DIAGNOSIS:   Increased nutrient needs related to chronic illness, wound healing(alcohol abuse; necrotizing fasciitis) as evidenced by estimated needs. -ongoing  GOAL:   Patient will meet greater than or equal to 90% of their needs -to be met with TF  MONITOR:   Vent status, TF tolerance, Labs, Weight trends, Skin  REASON FOR ASSESSMENT:   Ventilator, Consult Enteral/tube feeding initiation and management  ASSESSMENT:   49 y.o. female past medical history of hypertension and alcohol abuse. She presented to [facility] emergency room on 12/21/2019 due to having generalized weakness, poor p.o. intake and hallucinations which  progressively worsened over the past week as reported by her son as documented by the ER physician.  Admitted with hepatic encephalopathy.  Significant Events: 6/7- admission; initial RD assessment 6/8- Rapid Response; CT pelvis to r/o necrotizing fascitis--found to be positive for necrotizing fascitis and went to OR for debridement   Patient remains intubated following OR visit yesterday. OGT in place (gastric). Consult for TF. Patient is currently receiving TF per protocol: Vital High Protein @ 40 ml/hr with 30 ml prostat BID which is providing 1160 kcal, 114 grams protein, and 802 ml free water.   Patient discussed in rounds. RN reported that serum K plummeted after surgery, but is now much improved. Plan to return to OR tomorrow. Picture of wound prior to OR visit yesterday is in  Surgery PA's note 6/8 at 1148.   Patient's son is at bedside. Talked with him about tube feeding and answered all questions he had concerning this.   Re-estimated nutrition needs based on weight today of 94.4 kg; will continue to monitor weight trends closely and adjust as needed.   Notes indicates that patient has severe sepsis with shock--will order juven once this has begun to improve/resolve.    Patient is currently intubated on ventilator support MV: 11.4 L/min Temp (24hrs), Avg:99.3 F (37.4 C), Min:97.9 F (36.6 C), Max:99.8 F (37.7 C) Propofol: 10.5 ml/hr (277 kcal) BP: 86/49 and MAP: 59  Labs reviewed; K: 3.4 mmol/l, Cl: 112 mmol/l, BUN; 26 mg/dl, Ca: 7 mg/dl, Alk Phos elevated, LFTs elevated, ammonia: 64 umol/L. Medications reviewed; 1 mg folvite/day, sliding scale novolog, 2 g IV Mg sulfate x1 run 6/9, 40 mg protonix per OGT/day, 10 mEq IV KCl x6 runs 6/8, 40 mEq KCl per OGT x1 dose 6/9, 100 mg thiamine/day. IVF; LR @ 125 ml/hr. Drips; propofol @ 20 mcg/kg/min, neo @ 185 mcg/min, fentanyl @ 25 mcg/hr.    NUTRITION - FOCUSED PHYSICAL EXAM:  completed; no muscle or fat wasting, mild edema to BLE.   Diet Order:   Diet Order            Diet NPO time specified  Diet effective now              EDUCATION NEEDS:   Not appropriate for education at this time  Skin:  Skin Assessment: Skin Integrity Issues: Skin Integrity Issues:: Stage II, Stage IV, Incisions Stage II:  L buttocks x6 Unstageable: L buttocks Incisions: L buttocks (6/8)  Last BM:  6/7  Height:   Ht Readings from Last 1 Encounters:  12/23/19 5' 4"  (1.626 m)    Weight:   Wt Readings from Last 1 Encounters:  12/24/19 94.4 kg    Estimated Nutritional Needs:  Kcal:  1946 kcal Protein:  >/= 190 grams Fluid:  >/= 2 L/day     Jarome Matin, MS, RD, LDN, CNSC Inpatient Clinical Dietitian RD pager # available in AMION  After hours/weekend pager # available in [facility]

## 2019-12-24 NOTE — Consult Note (Addendum)
   [place] Nurse requested for preoperative stoma site marking; surgery team is following for assessment and plan of care for buttocks/sacrum wound. Plan for colostomy surgery to be performed tomorrow. Surgeon is currently discussing surgical procedure and stoma creation with patient's son.  Pt is critically ill and on the ventilator and is not awake.   Examined patient while lying in bed and sitting as upright as possible in the bed, in order to place the marking in the patient's visual field, away from any creases or abdominal contour issues and within the rectus muscle.  Attempted to mark below the patient's belt line.   Marked for colostomy in the LLQ  __6__ cm to the left of the umbilicus and __2__OF below the umbilicus.  Marked for ileostomy in the RLQ  __6__cm to the right of the umbilicus and  ___1_ cm below the umbilicus.  [place] Nurse team will follow up with patient after surgery for continued ostomy care and teaching.  Julien Girt MSN, RN, [place], [place], [place]

## 2019-12-24 NOTE — Progress Notes (Signed)
Met with son at the bedside earlier today  Discussed plans for return to the OR tomorrow for additional excisional debridement of the buttock region. After discussing the case with Dr. Kae Heller, it appears  anorectal region is involved in this necrotizing wound. We believe it will be very difficult to keep the wound clean without fecal diversion. Discussed with the son the rationale for fecal diversion. We will do the debridement part of the procedure first then proceed with laparoscopic loop colostomy.  Discussed risk and benefits of the procedure including but not limited to bleeding, infection, need for additional procedures, blood clot formation, chronic wound issues, permanent anorectal damage due to the nature of the infection, cardiac and pulmonary events, potential need for transfusion of blood products, ostomy issues such as pouching, retraction, ischemia, hernia formation MI: Multisystem organ failure; prolonged hospitalization and possible death  Given her chronic alcohol use and underlying liver disease I think a loop colostomy would be safer during this admission even though it may have more long-term issues with pouching and parastomal hernia  Will plan OR Thursday Stop tf at midnight Maintain type and cross Repeat labs in am  Leighton Ruff. Redmond Pulling, MD, FACS General, Bariatric, & Minimally Invasive Surgery [facility], [place]

## 2019-12-24 NOTE — Progress Notes (Signed)
Fever of 101.3 f reported to [facility]. No new med orders, advised to just use ice packs, cooling room temp to palliate due to elevated liver functions. Call if no improvement.

## 2019-12-24 NOTE — Progress Notes (Signed)
PT Cancellation Note  Patient Details Name: Alyssa Carey MRN: [id number] DOB: 1971/06/13   Cancelled Treatment:    Reason Eval/Treat Not Completed: Medical issues which prohibited therapy, intubated.   Claretha Cooper 12/24/2019, 12:12 PM [place] Pager [phone] Office [phone]

## 2019-12-24 NOTE — Consult Note (Addendum)
WOC follow-up: Pt went to the OR yesterday for extensive debridement of buttock wound R/T necrotizing fascitis.  Surgical team now following for assessment and plan of care.  Please refer to their team for further questions.  Please re-consult if further assistance is needed.  Thank-you,  Julien Girt MSN, [place], [place], [place], [place]

## 2019-12-24 NOTE — H&P (View-Only) (Signed)
Met with son at the bedside earlier today  Discussed plans for return to the OR tomorrow for additional excisional debridement of the buttock region. After discussing the case with Dr. Kae Heller, it appears  anorectal region is involved in this necrotizing wound. We believe it will be very difficult to keep the wound clean without fecal diversion. Discussed with the son the rationale for fecal diversion. We will do the debridement part of the procedure first then proceed with laparoscopic loop colostomy.  Discussed risk and benefits of the procedure including but not limited to bleeding, infection, need for additional procedures, blood clot formation, chronic wound issues, permanent anorectal damage due to the nature of the infection, cardiac and pulmonary events, potential need for transfusion of blood products, ostomy issues such as pouching, retraction, ischemia, hernia formation MI: Multisystem organ failure; prolonged hospitalization and possible death  Given her chronic alcohol use and underlying liver disease I think a loop colostomy would be safer during this admission even though it may have more long-term issues with pouching and parastomal hernia  Will plan OR Thursday Stop tf at midnight Maintain type and cross Repeat labs in am  Leighton Ruff. Redmond Pulling, MD, FACS General, Bariatric, & Minimally Invasive Surgery [facility], [place]

## 2019-12-25 ENCOUNTER — Inpatient Hospital Stay (HOSPITAL_COMMUNITY): Payer: Medicaid Other

## 2019-12-25 ENCOUNTER — Inpatient Hospital Stay (HOSPITAL_COMMUNITY): Payer: Medicaid Other | Admitting: Registered Nurse

## 2019-12-25 ENCOUNTER — Encounter (HOSPITAL_COMMUNITY): Payer: Self-pay | Admitting: Internal Medicine

## 2019-12-25 ENCOUNTER — Encounter (HOSPITAL_COMMUNITY)
Admission: EM | Disposition: A | Discharge status: Skilled Nursing Facility | Payer: Self-pay | Source: Home / Self Care | Attending: Internal Medicine

## 2019-12-25 HISTORY — PX: INCISION AND DRAINAGE PERIRECTAL ABSCESS: SHX1804

## 2019-12-25 HISTORY — PX: LAPAROSCOPIC LOOP COLOSTOMY: SHX6816

## 2019-12-25 LAB — POCT I-STAT 7, (LYTES, BLD GAS, ICA,H+H)
Acid-base deficit: 13 mmol/L — ABNORMAL HIGH (ref 0.0–2.0)
Acid-base deficit: 10 mmol/L — ABNORMAL HIGH (ref 0.0–2.0)
Bicarbonate: 13.6 mmol/L — ABNORMAL LOW (ref 20.0–28.0)
Bicarbonate: 16.2 mmol/L — ABNORMAL LOW (ref 20.0–28.0)
Calcium, Ion: 1.18 mmol/L (ref 1.15–1.40)
Calcium, Ion: 1.15 mmol/L (ref 1.15–1.40)
HCT: 28 % — ABNORMAL LOW (ref 36.0–46.0)
HCT: 25 % — ABNORMAL LOW (ref 36.0–46.0)
Hemoglobin: 9.5 g/dL — ABNORMAL LOW (ref 12.0–15.0)
Hemoglobin: 8.5 g/dL — ABNORMAL LOW (ref 12.0–15.0)
O2 Saturation: 97 %
O2 Saturation: 99 %
Potassium: 3.9 mmol/L (ref 3.5–5.1)
Potassium: 3.8 mmol/L (ref 3.5–5.1)
Sodium: 142 mmol/L (ref 135–145)
Sodium: 145 mmol/L (ref 135–145)
TCO2: 15 mmol/L — ABNORMAL LOW (ref 22–32)
TCO2: 17 mmol/L — ABNORMAL LOW (ref 22–32)
pCO2 arterial: 35 mmHg (ref 32.0–48.0)
pCO2 arterial: 38.5 mmHg (ref 32.0–48.0)
pH, Arterial: 7.197 — CL (ref 7.350–7.450)
pH, Arterial: 7.232 — ABNORMAL LOW (ref 7.350–7.450)
pO2, Arterial: 105 mmHg (ref 83.0–108.0)
pO2, Arterial: 143 mmHg — ABNORMAL HIGH (ref 83.0–108.0)

## 2019-12-25 LAB — GLUCOSE, CAPILLARY
Glucose-Capillary: 105 mg/dL — ABNORMAL HIGH (ref 70–99)
Glucose-Capillary: 106 mg/dL — ABNORMAL HIGH (ref 70–99)
Glucose-Capillary: 116 mg/dL — ABNORMAL HIGH (ref 70–99)
Glucose-Capillary: 125 mg/dL — ABNORMAL HIGH (ref 70–99)
Glucose-Capillary: 129 mg/dL — ABNORMAL HIGH (ref 70–99)
Glucose-Capillary: 91 mg/dL (ref 70–99)

## 2019-12-25 LAB — COMPREHENSIVE METABOLIC PANEL
ALT: 34 U/L (ref 0–44)
AST: 42 U/L — ABNORMAL HIGH (ref 15–41)
Albumin: 1.2 g/dL — ABNORMAL LOW (ref 3.5–5.0)
Alkaline Phosphatase: 266 U/L — ABNORMAL HIGH (ref 38–126)
Anion gap: 9 (ref 5–15)
BUN: 28 mg/dL — ABNORMAL HIGH (ref 6–20)
CO2: 15 mmol/L — ABNORMAL LOW (ref 22–32)
Calcium: 7.4 mg/dL — ABNORMAL LOW (ref 8.9–10.3)
Chloride: 114 mmol/L — ABNORMAL HIGH (ref 98–111)
Creatinine, Ser: 0.69 mg/dL (ref 0.44–1.00)
GFR calc Af Amer: >60 mL/min (ref >60)
GFR calc non Af Amer: >60 mL/min (ref >60)
Glucose, Bld: 125 mg/dL — ABNORMAL HIGH (ref 70–99)
Potassium: 2.9 mmol/L — ABNORMAL LOW (ref 3.5–5.1)
Sodium: 138 mmol/L (ref 135–145)
Total Bilirubin: 1.9 mg/dL — ABNORMAL HIGH (ref 0.3–1.2)
Total Protein: 4.5 g/dL — ABNORMAL LOW (ref 6.5–8.1)

## 2019-12-25 LAB — BLOOD GAS, ARTERIAL
Acid-base deficit: 8.3 mmol/L — ABNORMAL HIGH (ref 0.0–2.0)
Bicarbonate: 15.2 mmol/L — ABNORMAL LOW (ref 20.0–28.0)
Drawn by: 295031
FIO2: 30
MECHVT: 440 mL
O2 Saturation: 99.1 %
PEEP: 5 cmH2O
Patient temperature: 98.6
RATE: 20 resp/min
pCO2 arterial: 25.7 mmHg — ABNORMAL LOW (ref 32.0–48.0)
pH, Arterial: 7.388 (ref 7.350–7.450)
pO2, Arterial: 144 mmHg — ABNORMAL HIGH (ref 83.0–108.0)

## 2019-12-25 LAB — MITOCHONDRIAL ANTIBODIES: Mitochondrial M2 Ab, IgG: <20 Units (ref 0.0–20.0)

## 2019-12-25 LAB — BPAM RBC
Blood Product Expiration Date: 202106152359
Blood Product Expiration Date: 202106182359
ISSUE DATE / TIME: 202106081744
ISSUE DATE / TIME: 202106081744
Unit Type and Rh: 600
Unit Type and Rh: 600

## 2019-12-25 LAB — BASIC METABOLIC PANEL
Anion gap: 10 (ref 5–15)
BUN: 29 mg/dL — ABNORMAL HIGH (ref 6–20)
CO2: 15 mmol/L — ABNORMAL LOW (ref 22–32)
Calcium: 7.8 mg/dL — ABNORMAL LOW (ref 8.9–10.3)
Chloride: 115 mmol/L — ABNORMAL HIGH (ref 98–111)
Creatinine, Ser: 0.65 mg/dL (ref 0.44–1.00)
GFR calc Af Amer: >60 mL/min (ref >60)
GFR calc non Af Amer: >60 mL/min (ref >60)
Glucose, Bld: 116 mg/dL — ABNORMAL HIGH (ref 70–99)
Potassium: 3.9 mmol/L (ref 3.5–5.1)
Sodium: 140 mmol/L (ref 135–145)

## 2019-12-25 LAB — TYPE AND SCREEN
ABO/RH(D): A POS
Antibody Screen: NEGATIVE
Unit division: 0
Unit division: 0

## 2019-12-25 LAB — CBC
HCT: 26.8 % — ABNORMAL LOW (ref 36.0–46.0)
Hemoglobin: 9.3 g/dL — ABNORMAL LOW (ref 12.0–15.0)
MCH: 32.6 pg (ref 26.0–34.0)
MCHC: 34.7 g/dL (ref 30.0–36.0)
MCV: 94 fL (ref 80.0–100.0)
Platelets: 88 10*3/uL — ABNORMAL LOW (ref 150–400)
RBC: 2.85 MIL/uL — ABNORMAL LOW (ref 3.87–5.11)
RDW: 15.4 % (ref 11.5–15.5)
WBC: 24.6 10*3/uL — ABNORMAL HIGH (ref 4.0–10.5)
nRBC: 0.1 % (ref 0.0–0.2)

## 2019-12-25 LAB — MAGNESIUM
Magnesium: 1.9 mg/dL (ref 1.7–2.4)
Magnesium: 1.8 mg/dL (ref 1.7–2.4)

## 2019-12-25 LAB — PHOSPHORUS
Phosphorus: 2.6 mg/dL (ref 2.5–4.6)
Phosphorus: 4.2 mg/dL (ref 2.5–4.6)

## 2019-12-25 LAB — ANTI-SMOOTH MUSCLE ANTIBODY, IGG: F-Actin IgG: 6 Units (ref 0–19)

## 2019-12-25 LAB — TRIGLYCERIDES: Triglycerides: 61 mg/dL (ref <150)

## 2019-12-25 LAB — SURGICAL PATHOLOGY

## 2019-12-25 SURGERY — INCISION AND DRAINAGE, ABSCESS, PERIRECTAL
Anesthesia: General

## 2019-12-25 MED ORDER — PROPOFOL 10 MG/ML IV BOLUS
INTRAVENOUS | Status: AC
  Filled 2019-12-25: qty 20

## 2019-12-25 MED ORDER — POTASSIUM CHLORIDE 10 MEQ/50ML IV SOLN
10.0000 meq | INTRAVENOUS | Status: AC
  Administered 2019-12-25 (×8): 10 meq via INTRAVENOUS
  Filled 2019-12-25 (×8): qty 50

## 2019-12-25 MED ORDER — PHENYLEPHRINE HCL (PRESSORS) 10 MG/ML IV SOLN
INTRAVENOUS | Status: AC
  Filled 2019-12-25: qty 2

## 2019-12-25 MED ORDER — PHENYLEPHRINE HCL-NACL 20-0.9 MG/250ML-% IV SOLN
INTRAVENOUS | Status: DC | PRN
  Administered 2019-12-25: 200 ug/min via INTRAVENOUS

## 2019-12-25 MED ORDER — 0.9 % SODIUM CHLORIDE (POUR BTL) OPTIME
TOPICAL | Status: DC | PRN
  Administered 2019-12-25: 3000 mL

## 2019-12-25 MED ORDER — MIDAZOLAM HCL 5 MG/5ML IJ SOLN
INTRAMUSCULAR | Status: DC | PRN
  Administered 2019-12-25: 2 mg via INTRAVENOUS

## 2019-12-25 MED ORDER — VASOPRESSIN 20 UNIT/ML IV SOLN
INTRAVENOUS | Status: AC
  Filled 2019-12-25: qty 1

## 2019-12-25 MED ORDER — MIDAZOLAM HCL 2 MG/2ML IJ SOLN
INTRAMUSCULAR | Status: AC
  Filled 2019-12-25: qty 2

## 2019-12-25 MED ORDER — LACTATED RINGERS IR SOLN
Status: DC | PRN
  Administered 2019-12-25: 1000 mL

## 2019-12-25 MED ORDER — VITAL HIGH PROTEIN PO LIQD
1000.0000 mL | ORAL | Status: DC
  Administered 2019-12-26 – 2019-12-27 (×2): 1000 mL

## 2019-12-25 MED ORDER — ROCURONIUM BROMIDE 10 MG/ML (PF) SYRINGE
PREFILLED_SYRINGE | INTRAVENOUS | Status: DC | PRN
  Administered 2019-12-25: 30 mg via INTRAVENOUS
  Administered 2019-12-25: 40 mg via INTRAVENOUS
  Administered 2019-12-25: 50 mg via INTRAVENOUS

## 2019-12-25 MED ORDER — SODIUM BICARBONATE 8.4 % IV SOLN
INTRAVENOUS | Status: DC | PRN
  Administered 2019-12-25 (×2): 50 meq via INTRAVENOUS

## 2019-12-25 MED ORDER — BUPIVACAINE-EPINEPHRINE 0.5% -1:200000 IJ SOLN
INTRAMUSCULAR | Status: AC
  Filled 2019-12-25: qty 1

## 2019-12-25 MED ORDER — VASOPRESSIN 20 UNIT/ML IV SOLN
INTRAVENOUS | Status: DC | PRN
Start: 2019-12-25 — End: 2019-12-25
  Administered 2019-12-25 (×2): 2 [IU] via INTRAVENOUS

## 2019-12-25 MED ORDER — ALBUMIN HUMAN 5 % IV SOLN
INTRAVENOUS | Status: DC | PRN
Start: 2019-12-25 — End: 2019-12-25

## 2019-12-25 MED ORDER — BUPIVACAINE HCL 0.25 % IJ SOLN
INTRAMUSCULAR | Status: AC
  Filled 2019-12-25: qty 1

## 2019-12-25 MED ORDER — LACTATED RINGERS IV SOLN: INTRAVENOUS | Status: DC | PRN

## 2019-12-25 MED ORDER — LIDOCAINE 2% (20 MG/ML) 5 ML SYRINGE
INTRAMUSCULAR | Status: AC
  Filled 2019-12-25: qty 5

## 2019-12-25 MED ORDER — ROCURONIUM BROMIDE 10 MG/ML (PF) SYRINGE
PREFILLED_SYRINGE | INTRAVENOUS | Status: AC
  Filled 2019-12-25: qty 30

## 2019-12-25 MED ORDER — PHENYLEPHRINE 40 MCG/ML (10ML) SYRINGE FOR IV PUSH (FOR BLOOD PRESSURE SUPPORT)
PREFILLED_SYRINGE | INTRAVENOUS | Status: DC | PRN
  Administered 2019-12-25 (×3): 160 ug via INTRAVENOUS
  Administered 2019-12-25 (×2): 120 ug via INTRAVENOUS
  Administered 2019-12-25 (×4): 160 ug via INTRAVENOUS

## 2019-12-25 MED ORDER — PHENYLEPHRINE HCL-NACL 10-0.9 MG/250ML-% IV SOLN
INTRAVENOUS | Status: AC
  Filled 2019-12-25: qty 250

## 2019-12-25 MED ORDER — FENTANYL CITRATE (PF) 250 MCG/5ML IJ SOLN
INTRAMUSCULAR | Status: AC
  Filled 2019-12-25: qty 5

## 2019-12-25 MED ORDER — BUPIVACAINE-EPINEPHRINE 0.5% -1:200000 IJ SOLN
INTRAMUSCULAR | Status: DC | PRN
  Administered 2019-12-25: 7 mL

## 2019-12-25 SURGICAL SUPPLY — 83 items
APPLIER CLIP 5 13 M/L LIGAMAX5 (MISCELLANEOUS)
APPLIER CLIP ROT 10 11.4 M/L (STAPLE)
BENZOIN TINCTURE PRP APPL 2/3 (GAUZE/BANDAGES/DRESSINGS) IMPLANT
BLADE EXTENDED COATED 6.5IN (ELECTRODE) IMPLANT
BLADE SURG SZ10 CARB STEEL (BLADE) IMPLANT
BNDG GAUZE ELAST 4 BULKY (GAUZE/BANDAGES/DRESSINGS) ×1 IMPLANT
CABLE HIGH FREQUENCY MONO STRZ (ELECTRODE) ×1 IMPLANT
CELLS DAT CNTRL 66122 CELL SVR (MISCELLANEOUS) IMPLANT
CHLORAPREP W/TINT 26 (MISCELLANEOUS) ×2 IMPLANT
CLIP APPLIE 5 13 M/L LIGAMAX5 (MISCELLANEOUS) IMPLANT
CLIP APPLIE ROT 10 11.4 M/L (STAPLE) IMPLANT
COVER MAYO STAND STRL (DRAPES) IMPLANT
COVER SURGICAL LIGHT HANDLE (MISCELLANEOUS) ×2 IMPLANT
COVER WAND RF STERILE (DRAPES) IMPLANT
DECANTER SPIKE VIAL GLASS SM (MISCELLANEOUS) ×2 IMPLANT
DERMABOND ADVANCED (GAUZE/BANDAGES/DRESSINGS) ×1
DERMABOND ADVANCED .7 DNX12 (GAUZE/BANDAGES/DRESSINGS) IMPLANT
DRAPE LAPAROSCOPIC ABDOMINAL (DRAPES) IMPLANT
DRAPE LAPAROTOMY T 102X78X121 (DRAPES) ×1 IMPLANT
DRAPE LAPAROTOMY T 98X78 PEDS (DRAPES) IMPLANT
DRAPE LAPAROTOMY TRNSV 102X78 (DRAPES) IMPLANT
DRAPE SHEET LG 3/4 BI-LAMINATE (DRAPES) IMPLANT
DRAPE UTILITY XL STRL (DRAPES) ×2 IMPLANT
DRAPE WARM FLUID 44X44 (DRAPES) IMPLANT
DRSG PAD ABDOMINAL 8X10 ST (GAUZE/BANDAGES/DRESSINGS) ×1 IMPLANT
ELECT PENCIL ROCKER SW 15FT (MISCELLANEOUS) ×1 IMPLANT
ELECT REM PT RETURN 15FT ADLT (MISCELLANEOUS) ×2 IMPLANT
GAUZE PACKING IODOFORM 1/4X15 (PACKING) IMPLANT
GAUZE SPONGE 4X4 12PLY STRL (GAUZE/BANDAGES/DRESSINGS) ×2 IMPLANT
GLOVE BIO SURGEON STRL SZ7.5 (GLOVE) ×2 IMPLANT
GLOVE BIOGEL M STRL SZ7.5 (GLOVE) ×2 IMPLANT
GLOVE INDICATOR 8.0 STRL GRN (GLOVE) ×2 IMPLANT
GOWN STRL REUS W/TWL XL LVL3 (GOWN DISPOSABLE) ×8 IMPLANT
HANDLE SUCTION POOLE (INSTRUMENTS) IMPLANT
HANDPIECE INTERPULSE COAX TIP (DISPOSABLE) ×1
IRRIG SUCT STRYKERFLOW 2 WTIP (MISCELLANEOUS)
IRRIGATION SUCT STRKRFLW 2 WTP (MISCELLANEOUS) IMPLANT
KIT BASIN (CUSTOM PROCEDURE TRAY) ×2 IMPLANT
KIT TURNOVER KIT A (KITS) IMPLANT
LEGGING LITHOTOMY PAIR STRL (DRAPES) IMPLANT
MARKER SKIN DUAL TIP RULER LAB (MISCELLANEOUS) IMPLANT
NDL HYPO 25X1 1.5 SAFETY (NEEDLE) ×1 IMPLANT
NEEDLE HYPO 25X1 1.5 SAFETY (NEEDLE) ×2 IMPLANT
PACK GENERAL/GYN (CUSTOM PROCEDURE TRAY) ×2 IMPLANT
PENCIL SMOKE EVACUATOR (MISCELLANEOUS) IMPLANT
RETRACTOR WND ALEXIS 18 MED (MISCELLANEOUS) IMPLANT
RTRCTR WOUND ALEXIS 18CM MED (MISCELLANEOUS)
SCISSORS LAP 5X35 DISP (ENDOMECHANICALS) ×2 IMPLANT
SET HNDPC FAN SPRY TIP SCT (DISPOSABLE) IMPLANT
SET IRRIG TUBING LAPAROSCOPIC (IRRIGATION / IRRIGATOR) ×1 IMPLANT
SET TUBE SMOKE EVAC HIGH FLOW (TUBING) ×1 IMPLANT
SHEARS HARMONIC ACE PLUS 36CM (ENDOMECHANICALS) IMPLANT
SLEEVE XCEL OPT CAN 5 100 (ENDOMECHANICALS) ×2 IMPLANT
SPONGE LAP 18X18 RF (DISPOSABLE) IMPLANT
SPONGE LAP 4X18 RFD (DISPOSABLE) IMPLANT
STAPLER VISISTAT 35W (STAPLE) IMPLANT
STRIP CLOSURE SKIN 1/2X4 (GAUZE/BANDAGES/DRESSINGS) IMPLANT
SUCTION POOLE HANDLE (INSTRUMENTS)
SUT MNCRL AB 4-0 PS2 18 (SUTURE) IMPLANT
SUT PDS AB 1 TP1 96 (SUTURE) IMPLANT
SUT PROLENE 2 0 KS (SUTURE) IMPLANT
SUT PROLENE 2 0 SH DA (SUTURE) IMPLANT
SUT SILK 2 0 (SUTURE)
SUT SILK 2 0 SH CR/8 (SUTURE) IMPLANT
SUT SILK 2-0 18XBRD TIE 12 (SUTURE) IMPLANT
SUT SILK 3 0 (SUTURE)
SUT SILK 3 0 SH CR/8 (SUTURE) IMPLANT
SUT SILK 3-0 18XBRD TIE 12 (SUTURE) IMPLANT
SUT VIC AB 3-0 SH 18 (SUTURE) IMPLANT
SWAB COLLECTION DEVICE MRSA (MISCELLANEOUS) IMPLANT
SWAB CULTURE ESWAB REG 1ML (MISCELLANEOUS) IMPLANT
SYR BULB IRRIG 60ML STRL (SYRINGE) IMPLANT
SYR CONTROL 10ML LL (SYRINGE) ×2 IMPLANT
SYS LAPSCP GELPORT 120MM (MISCELLANEOUS)
SYSTEM LAPSCP GELPORT 120MM (MISCELLANEOUS) IMPLANT
TOWEL OR 17X26 10 PK STRL BLUE (TOWEL DISPOSABLE) ×2 IMPLANT
TOWEL OR NON WOVEN STRL DISP B (DISPOSABLE) ×2 IMPLANT
TRAY FOLEY MTR SLVR 16FR STAT (SET/KITS/TRAYS/PACK) ×2 IMPLANT
TRAY LAPAROSCOPIC (CUSTOM PROCEDURE TRAY) ×2 IMPLANT
TROCAR BLADELESS OPT 5 100 (ENDOMECHANICALS) ×2 IMPLANT
TROCAR XCEL NON-BLD 11X100MML (ENDOMECHANICALS) IMPLANT
YANKAUER SUCT BULB TIP 10FT TU (MISCELLANEOUS) IMPLANT
YANKAUER SUCT BULB TIP NO VENT (SUCTIONS) IMPLANT

## 2019-12-25 NOTE — Progress Notes (Signed)
NAME:  Alyssa Carey, MRN:  [id number], DOB:  17-Sep-1970, LOS: 4 ADMISSION DATE:  12/21/2019, CONSULTATION DATE:  6/8 REFERRING MD:  Kshitiz, CHIEF COMPLAINT:  Confusion   Brief History   49 y/o female with a heavy alcohol history admitted in the setting of confusion and presumed alcohol withdrawal who later developed sepsis from a gas forming wound.    Past Medical History  Alcohol abuse  Significant Hospital Events   6/6 admission 6/8 ICU admission to OR ofr I&D, back intubated and on pressors, 6/9 started tube feeds. Still pressor dependent.kept on vent w/ anticipated return to OR planned  Consults:  General surgery PCCM  Procedures:   oett 6/8>>> Right IJ CVL 6/8>>> Radial aline left 6/8>>  Significant Diagnostic Tests:  6/6 CT head > NAICP 6/7 U/S abdomen > hepatic steatosis 6/8 CT Pelvis > gas throughout medial subcutaneous soft tissues of left buttock into the left perineum  Micro Data:  6/6 SARS COV 2 > neg 6/6 blood culture >  6/8 urine > mult orgs  6/8 blood >  6/8: abd GPC,GNR,GPR>>  Antimicrobials:  6/7 ceftriaxone >  6/8 vanc >  6/8 flagyl > 6/9 6/8 clinda >   Interim history/subjective:  Remains sedated  Objective   Blood pressure (Abnormal) 72/42, pulse 97, temperature 99.9 F (37.7 C), temperature source Axillary, resp. rate (Abnormal) 26, height 5' 4"  (1.626 m), weight 94.4 kg, last menstrual period 03/23/2019, SpO2 99 %. CVP:  [0 mmHg-10 mmHg] 10 mmHg  Vent Mode: PRVC FiO2 (%):  [30 %-40 %] 30 % Set Rate:  [20 bmp] 20 bmp Vt Set:  [440 mL] 440 mL PEEP:  [5 cmH20] 5 cmH20 Plateau Pressure:  [11 cmH20-21 cmH20] 21 cmH20   Intake/Output Summary (Last 24 hours) at 12/25/2019 2947 Last data filed at 12/25/2019 6546 Gross per 24 hour  Intake 6093.48 ml  Output 750 ml  Net 5343.48 ml   Filed Weights   12/23/19 1321 12/24/19 0500 12/25/19 0500  Weight: 87.5 kg 94.4 kg 94.4 kg    Examination:  General 49 year old black female currently  sedated on mechanical ventilator HEENT normocephalic atraumatic orally intubated no JVD right IJ central venous catheter in place sclera nonicteric Pulmonary clear to auscultation equal chest rise minimal vent settings Cardiac regular rate and rhythm Abdomen soft with positive bowel sounds Extremities are warm and dry GU with concentrated urine Neuro sedated moving all extremities agitated when sedation reduced Derm buttocks dressing intact   Resolved Hospital Problem list     Assessment & Plan:   Severe sepsis/septic shock in setting of necrotizing fasciitis (present on admission): Now status post incision and debridement of skin, subcutaneous tissue, fascia and muscle of the left buttocks 6/8 Plan Continuing IV fluids ensuring euvolemia  Titrate vasoactive drips for mean arterial pressure greater than 65  Plan is to return to the operating room today for further debridement and also diverting colostomy, I agree with this, I worry about fecal contamination otherwise  Day #4 ceftriaxone, #3 clindamycin and vancomycin, cultures are still pending   Acute hypoxic respiratory failure.  Atelectasis Endotracheal tubes in satisfactory position, right IJ triple-lumen catheter in satisfactory position, nasogastric tube in satisfactory position.  There is right greater than left basilar volume loss likely reflecting atelectasis Arterial blood gas repeated this morning, does have both primary metabolic acidosis and respiratory alkalosis.  Suspect her alkalosis component being driven by her cirrhosis/liver dysfunction more than agitation, she is overbreathing the set rate on her ventilator Plan  Continue full ventilator support Continue VAP PAD protocol Can assess for spontaneous breathing trial following successful surgery assuming hemodynamically stable  Fluid and Electrolyte imbalance none anion gap metabolic acidosis in setting of hyperchloremia, hypokalemia Plan Continue maintenance IV  fluids with LR Replace potassium Serial chemistries  Hepatic encephalopathy due to hepatic, alcoholic steatosis> worsening in setting of sepsis Plan PAD protocol Continue thiamine and folate Holding lactulose right now, resume after colostomy and okay with surgical team  Hyperglycemia:  Plan Continue sliding scale insulin  Alcohol abuse: Plan We will need counseling post extubation  Thrombocytopenia due to alcohol abuse: - Did receive platelets intraoperatively, holding overnight on 6/9, these have held over 24 hours Plan Continue to trend  Alcoholic Hepatitis: improving Plan Intermittent LFT checks  Protein calorie malnutrition Plan Resume tube feeds when okay with surgical team  Code status: Full code for now, will need to continue to discuss this Best practice:  Nutrition: Start tube feeds 6/9 Sedation: Protocol initiated 6/8 PUD: Start PPI 6/9 Glycemic control: Initiate glycemic protocol 6/9 DVT: SCDs  CODE STATUS: Full code Disposition continue in intensive care.  Continue full ventilatory support, let things stabilize.  Plan for back to the operating room on 6/10   Labs   CBC: Recent Labs  Lab 12/23/19 0140 12/23/19 0140 12/23/19 1039 12/23/19 2016 12/23/19 2145 12/24/19 0600 12/25/19 0400  WBC 15.8*  --  15.6*  --  16.9* 21.2* 24.6*  NEUTROABS 14.3*  --  13.6*  --   --  17.9*  --   HGB 13.1   < > 12.4 9.5* 10.8* 10.3* 9.3*  HCT 37.2   < > 35.5* 28.0* 30.4* 29.9* 26.8*  MCV 93.7  --  93.9  --  94.1 93.7 94.0  PLT 31*  --  32*  --  92* 97* 88*   < > = values in this interval not displayed.    Basic Metabolic Panel: Recent Labs  Lab 12/22/19 0136 12/22/19 0136 12/23/19 0140 12/23/19 0140 12/23/19 1039 12/23/19 2016 12/23/19 2145 12/24/19 0600 12/24/19 1008 12/24/19 1706 12/25/19 0400  NA 133*   < > 134*   < > 137 145 139 137  --   --  138  K 3.5   < > 4.0   < > 3.4* 2.3* 2.4* 3.4*  --   --  2.9*  CL 102   < > 105  --  106  --  113*  112*  --   --  114*  CO2 16*   < > 16*  --  19*  --  16* 13*  --   --  15*  GLUCOSE 126*   < > 171*  --  136*  --  118* 171*  --   --  125*  BUN 39*   < > 33*  --  34*  --  27* 26*  --   --  28*  CREATININE 1.32*   < > 0.84  --  0.75  --  0.59 0.68  --   --  0.69  CALCIUM 7.9*   < > 8.1*  --  8.1*  --  7.0* 7.0*  --   --  7.4*  MG 2.3   < > 2.0  --   --   --   --  1.7 1.7 2.2 1.9  PHOS 3.6  --   --   --   --   --   --   --  3.9 3.0 2.6   < > =  values in this interval not displayed.   GFR: Estimated Creatinine Clearance: 97.9 mL/min (by C-G formula based on SCr of 0.69 mg/dL). Recent Labs  Lab 12/23/19 0140 12/23/19 0140 12/23/19 1039 12/23/19 2145 12/24/19 0600 12/24/19 1008 12/24/19 1215 12/25/19 0400  WBC 15.8*   < > 15.6* 16.9* 21.2*  --   --  24.6*  LATICACIDVEN 3.3*  --  2.3*  --   --  2.3* 2.1*  --    < > = values in this interval not displayed.    Liver Function Tests: Recent Labs  Lab 12/23/19 0140 12/23/19 1039 12/23/19 2145 12/24/19 0600 12/25/19 0400  AST 159* 123* 81* 64* 42*  ALT 68* 62* 51* 46* 34  ALKPHOS 311* 318* 268* 264* 266*  BILITOT 3.4* 2.8* 2.3* 2.1* 1.9*  PROT 5.3* 5.1* 4.6* 4.6* 4.5*  ALBUMIN 1.6* 1.5* 1.4* 1.4* 1.2*   No results for input(s): LIPASE, AMYLASE in the last 168 hours. Recent Labs  Lab 12/21/19 1658 12/22/19 0136 12/23/19 1039 12/24/19 0630  AMMONIA 76* 77* 76* 64*    ABG    Component Value Date/Time   PHART 7.379 12/24/2019 1025   PCO2ART 24.5 (L) 12/24/2019 1025   PO2ART 189 (H) 12/24/2019 1025   HCO3 14.1 (L) 12/24/2019 1025   TCO2 19 (L) 12/23/2019 2016   ACIDBASEDEF 9.4 (H) 12/24/2019 1025   O2SAT 99.5 12/24/2019 1025     Coagulation Profile: Recent Labs  Lab 12/22/19 0136 12/23/19 1221 12/23/19 2145  INR 1.3* 1.3* 1.4*    Cardiac Enzymes: No results for input(s): CKTOTAL, CKMB, CKMBINDEX, TROPONINI in the last 168 hours.  HbA1C: Hgb A1c MFr Bld  Date/Time Value Ref Range Status  12/24/2019 10:08  AM 5.7 (H) 4.8 - 5.6 % Final    Comment:    (NOTE) Pre diabetes:          5.7%-6.4% Diabetes:              >6.4% Glycemic control for   <7.0% adults with diabetes     CBG: Recent Labs  Lab 12/24/19 1609 12/24/19 1952 12/24/19 2338 12/25/19 0356 12/25/19 0800  GLUCAP 175* 151* 153* 125* 129*     Critical care time: 42 minutes   Erick Colace ACNP-BC [place] Pager # [phone] OR # [phone] if no answer

## 2019-12-25 NOTE — Progress Notes (Signed)
Verified with on call surgeon, Dr. Marcello Moores, that patient can have meds via OG, but wait to start tube feeds until after surgeon rounds on her tomorrow.

## 2019-12-25 NOTE — Op Note (Addendum)
12/25/2019  5:33 PM  PATIENT:  Alyssa Carey  50 y.o. female  PRE-OPERATIVE DIAGNOSIS: Necrotizing fasciitis of left buttock, need for fecal diversion  POST-OPERATIVE DIAGNOSIS: Same  PROCEDURE:  Procedure(s): Debridement of skin, subcutaneous tissue, fascia and muscle left buttock, pulsatile lavage LAPAROSCOPIC LOOP DESCENDING COLOSTOMY  SURGEON:  Surgeon(s): Greer Pickerel, MD  ASSISTANTS: none   ANESTHESIA:   general  DRAINS: Urinary Catheter (Foley)   LOCAL MEDICATIONS USED:  MARCAINE     SPECIMEN:  No Specimen  EBL: 75 cc  Complications none immediately apparent  DISPOSITION OF SPECIMEN:  N/A  COUNTS:  YES  INDICATION FOR PROCEDURE: Patient is a 49 year old female who was admitted a few days ago with a necrotizing fasciitis of her left buttock.  She underwent initial debridement on June 8.  The necrotizing soft tissue infection involves skin, subtenons tissue fascia and muscle and was very close to her anus and there appeared to be a connection between her anus and the wound.  She was brought back today for additional exam and debridement as well as fecal diversion.  There was significant concern that she would have ongoing spillage of stool into the wound making wound healing very difficult.  There is also concerned that the anus was involved.  I had a long conversation with her son who agreed for additional debridement and fecal diversion with a loop colostomy.  We did discuss that she was at higher risk for complications because of her underlying liver disease ascites and severe sepsis and malnutrition.  We discussed that there is a risk of ostomy herniation, retraction, parastomal hernia, ischemia to the ostomy, wound issues, injury to surrounding structures, perioperative cardiac and pulmonary events blood clots, chronic wound healing issues, need for additional debridement as well as the possibility of death  PROCEDURE: After obtaining informed consent from her son  patient was taken directly from the ICU intubated to OR four at [facility].  Her endotracheal tube was connected to the anesthesia circuit.  She was then placed in the prone position with the appropriate padding.  We then had to place her back in the supine position because the way the orogastric tube was taped to the endotracheal tube it was causing the endotracheal tube to kink.  Once the tubes were retaped.  She was then placed in the prone position with the appropriate padding.  Her buttocks were cleaned with Hibiclens and then Betadine and then draped in usual standard surgical fashion.  She had a large open wound from her prior debridement.  There was stool in the wound.  Surgical timeout was performed.  She was on broad-spectrum scheduled IV antibiotics.  The wound dimensions at the be in the case were 15 cm x 15 cm x 6 cm deep.  There was necrotic fat and soft tissue and some necrotic muscle.  The wound was involving the left perineum as well as well as the anal verge as well as some of the external hemorrhoidal tissue on the left.  Digital rectal exam revealed no overt opening within the anal rectal vault.  However the abscess cavity did track very parallel to to the rectum.  I went about sharply excising additional skin and subcutaneous tissue fat with scalpel that was nonviable.  I used a rongeur to also sharply debride nonviable fat, muscle and fascia.  The wound was irrigated with 3 L of pulsatile lavage.  Hemostasis was then obtained with electrocautery.  Again the ischial tuberosity was palpable at the  base of the wound but there was not frankly exposed.  Total area of tissue that was sharply excised with a combination of scalpel and rongeur measured 8 x by 8 x 4 cm deep.  That was the total volume of tissue removed.  Current wound dimensions measured 24 cm vertical by about 17 cm wide by 6 cm deep.  The cavity was packed with 1 moistened Kerlix followed by ABDs and fluffs  The  patient was then placed back onto the stretcher in the supine position and then moved back to the OR table.  Anesthesia placed an A-line  Right arm was then tucked  Her abdomen was then prepped and draped in the usual standard surgical fashion with ChloraPrep.  Another surgical timeout was performed.  The patient had been marked for ostomies by the wound care nurse.  Access was gained to the abdomen using the Optiview technique in the right upper quadrant.  A small incision was made just below the right subcostal margin.  Then using a 0 degree 5 mm laparoscope through a 5 Miller trocar it was advanced through all layers of the abdominal wall and carefully entered the abdominal cavity.  Pneumoperitoneum was established without any change in patient vital signs.  Laparoscope was advanced and the abdominal cavity was surveilled.  There is no evidence of injury to surrounding structures.  There was bilious ascites in the abdomen primarily in the right upper quadrant.  The liver was very enlarged consistent with hepatomegaly but not terribly nodular.  There were omental adhesions along the midline from the subxiphoid position all the way down to the lower midline.  Two 5 mm trochars were placed in the right lateral abdomen all under direct visualization after local been infiltrated.  Then using EndoShears with electrocautery I took down the omental adhesions from the anterior abdominal wall.  I lifted the omentum up into the upper abdomen.  It appeared that a section of her descending colon would easily reach the location in the left midabdomen that of been marked by the wound care nurse.  A circular incision was made using cautery over this ostomy marked.  Some of the subcutaneous tissue was debulked with cautery.  Fascia was incised in a cruciate fashion with cautery.  The rectus muscle was spread and the abdominal cavity was entered.  I had previously grasped an epiploic appendage along the descending colon  from the right side the abdomen.  Once I was able to pass 2 fingers through the fascial defect at the ostomy site I was able to deliver the descending colon I grab it with a Babcock.  I was able to bring up the loop easily.  I was able to get one finger around the colon through the fascia so it did not appear terribly tight nor too loose.  A small tiny rent was made in the mesentery just next to the bowel lumen in order to pass a red rubber to serve as an ostomy bridge.  I then went back in laparoscopically to verify correct orientation of the ostomy.  It was not twisted.  At this point pneumoperitoneum was released and the trochars were removed.  Skin incisions on the right were closed with 4-0 Monocryl in subcuticular fashion followed by application of Dermabond.  At this point I then matured the descending loop colostomy in typical fashion.  They have electrocautery was used to incise the anterior colon wall transversely.  Then using 3-0 Vicryl sutures in multiple fashion I matured  the ostomy in typical fashion to the dermis of the skin.  The ostomy was viable.  I was able to pass a finger both proximally and distally.  The red rubber was trimmed and the 2 ends were secured to itself with a 2-0 nylon.  An ostomy appliance was then applied.  All needle, instrument, and sponge counts were correct x2.  There are no immediate complications.  Patient tolerated procedure well  sHe was taken directly back to the ICU intubated and sedated.  Discussed operative findings with critical care attending   Updated son at end of case  PLAN OF CARE: Admit to inpatient   PATIENT DISPOSITION:  ICU - intubated and critically ill.   Delay start of Pharmacological VTE agent (>24hrs) due to surgical blood loss or risk of bleeding:  no  Leighton Ruff. Redmond Pulling, MD, FACS General, Bariatric, & Minimally Invasive Surgery [facility], [place]

## 2019-12-25 NOTE — Progress Notes (Signed)
Tube feeding stopped at 0000 for surgery today. Irrigated tube (both ports) with 120 ml air each.

## 2019-12-25 NOTE — Progress Notes (Signed)
K 2.9 Replaced per protocol

## 2019-12-25 NOTE — Progress Notes (Signed)
[place] pulmonary critical care medicine evening rounds  Back from OR, had debridement, ostomy placement, arterial line placed Currently on less Neo-Synephrine than prior to going to the operating room Ostomy in place Comfortable on vent ET tube was adjusted in the operating room with multiple movements from prone to supine Chest x-ray pending Continue current management  Son updated by phone  Roselie Awkward, MD [place] [facility] Pager: [phone] Cell: [phone] If no response, call [phone]

## 2019-12-25 NOTE — Anesthesia Preprocedure Evaluation (Addendum)
Anesthesia Evaluation  Patient identified by MRN, date of birth, ID band Patient unresponsive    Reviewed: Allergy & Precautions, Patient's Chart, lab work & pertinent test results, Unable to perform ROS - Chart review only  History of Anesthesia Complications Negative for: history of anesthetic complications  Airway Mallampati: Intubated      Comment:  Unable to complete airway exam due to mental status  Dental   Pulmonary Current Smoker and Patient abstained from smoking.,  Intubated/VDRF   + rhonchi  (-) decreased breath sounds      Cardiovascular  Rhythm:Regular Rate:Tachycardia  Septic and hypotensive: on phenylephrine support   Neuro/Psych PSYCHIATRIC DISORDERS sedated    GI/Hepatic negative GI ROS, (+) Cirrhosis     substance abuse  alcohol use,   Endo/Other  Morbid obesity  Renal/GU ARFRenal disease     Musculoskeletal   Abdominal (+) + obese,   Peds  Hematology  Thrombocytopenia - Plt 88k, Hb 9.3 INR 1.3    Anesthesia Other Findings Altered mental status, hepatic encephalopathy Necrotizing fasciitis Covid neg 6/6   Reproductive/Obstetrics                           Anesthesia Physical  Anesthesia Plan  ASA: IV  Anesthesia Plan: General   Post-op Pain Management:    Induction: Intravenous  PONV Risk Score and Plan: 3 and Treatment may vary due to age or medical condition and Ondansetron  Airway Management Planned: Oral ETT  Additional Equipment: Arterial line and CVP  Intra-op Plan:   Post-operative Plan: Post-operative intubation/ventilation  Informed Consent: I have reviewed the patients History and Physical, chart, labs and discussed the procedure including the risks, benefits and alternatives for the proposed anesthesia with the patient or authorized representative who has indicated his/her understanding and acceptance.     History available from chart  only and Consent reviewed with POA  Plan Discussed with: CRNA and Surgeon  Anesthesia Plan Comments: (History received and consent obtained from son, Deantray(sp?) Berline Lopes, via telephone by Dr. Lissa Hoard)       Anesthesia Quick Evaluation

## 2019-12-25 NOTE — Progress Notes (Signed)
Physical Therapy Discharge Patient Details Name: Alyssa Carey MRN: [id number] DOB: 12-Aug-1970 Today's Date: 12/25/2019 Time:  -     Patient discharged from PT services secondary to medical issues, to return to surgery, on vent. Please  re-order PT to resume therapy services when medically ready.  Please see latest therapy progress note for current level of functioning and progress toward goals.         Claretha Cooper 12/25/2019, 7:30 AM [place] Pager [phone] Office [phone]

## 2019-12-25 NOTE — Transfer of Care (Signed)
Immediate Anesthesia Transfer of Care Note  Patient: Alyssa Carey  Procedure(s) Performed: IRRIGATION AND DEBRIDEMENT BUTTOCKS, LAP LOOP COLOSTOMY (N/A ) LAPAROSCOPIC LOOP COLOSTOMY (N/A )  Patient Location: ICU  Anesthesia Type:General  Level of Consciousness: sedated and Patient remains intubated per anesthesia plan  Airway & Oxygen Therapy: Patient remains intubated per anesthesia plan  Post-op Assessment: Report given to RN and Post -op Vital signs reviewed and stable  Post vital signs: Reviewed and stable  Last Vitals:  Vitals Value Taken Time  BP    Temp    Pulse    Resp    SpO2      Last Pain:  Vitals:   12/25/19 1200  TempSrc: Axillary  PainSc:       Patients Stated Pain Goal: 2 (80/22/17 9810)  Complications: No complications documented.

## 2019-12-25 NOTE — Anesthesia Procedure Notes (Addendum)
Arterial Line Insertion Start/End6/04/2020 4:02 PM, 12/25/2019 4:05 PM Performed by: Annye Asa, MD, anesthesiologist  Preanesthetic checklist: patient identified, IV checked, site marked, risks and benefits discussed, surgical consent, monitors and equipment checked, pre-op evaluation, timeout performed and anesthesia consent Right, radial was placed Catheter size: 22 G Hand hygiene performed , maximum sterile barriers used  and Seldinger technique used Allen's test indicative of satisfactory collateral circulation Attempts: 1 Procedure performed using ultrasound guided technique. Ultrasound Notes:anatomy identified and needle tip was noted to be adjacent to the nerve/plexus identified Following insertion, Biopatch and dressing applied. Post procedure assessment: normal  Patient tolerated the procedure well with no immediate complications.

## 2019-12-25 NOTE — Interval H&P Note (Signed)
History and Physical Interval Note:  12/25/2019 12:54 PM  Alyssa Carey  has presented today for surgery, with the diagnosis of infected buttocks and loop ileostomy.  The various methods of treatment have been discussed with the patient and family. After consideration of risks, benefits and other options for treatment, the patient has consented to  Procedure(s): IRRIGATION AND DEBRIDEMENT BUTTOCKS, LAP LOOP COLOSTOMY (N/A) LAPAROSCOPIC LOOP COLOSTOMY (N/A) as a surgical intervention.  The patient's history has been reviewed, patient examined, no change in status, stable for surgery.  I have reviewed the patient's chart and labs.  Questions were answered to the patient's satisfaction.    Leighton Ruff. Redmond Pulling, MD, FACS General, Bariatric, & Minimally Invasive Surgery [facility]   Greer Pickerel

## 2019-12-26 ENCOUNTER — Encounter (HOSPITAL_COMMUNITY): Payer: Self-pay | Admitting: General Surgery

## 2019-12-26 DIAGNOSIS — J9601 Acute respiratory failure with hypoxia: Secondary | ICD-10-CM

## 2019-12-26 LAB — TRIGLYCERIDES: Triglycerides: 106 mg/dL (ref <150)

## 2019-12-26 LAB — CBC
HCT: 26.2 % — ABNORMAL LOW (ref 36.0–46.0)
Hemoglobin: 8.8 g/dL — ABNORMAL LOW (ref 12.0–15.0)
MCH: 32.6 pg (ref 26.0–34.0)
MCHC: 33.6 g/dL (ref 30.0–36.0)
MCV: 97 fL (ref 80.0–100.0)
Platelets: 89 10*3/uL — ABNORMAL LOW (ref 150–400)
RBC: 2.7 MIL/uL — ABNORMAL LOW (ref 3.87–5.11)
RDW: 16.4 % — ABNORMAL HIGH (ref 11.5–15.5)
WBC: 19.7 10*3/uL — ABNORMAL HIGH (ref 4.0–10.5)
nRBC: 0 % (ref 0.0–0.2)

## 2019-12-26 LAB — BASIC METABOLIC PANEL
Anion gap: 11 (ref 5–15)
Anion gap: 7 (ref 5–15)
BUN: 29 mg/dL — ABNORMAL HIGH (ref 6–20)
BUN: 32 mg/dL — ABNORMAL HIGH (ref 6–20)
CO2: 16 mmol/L — ABNORMAL LOW (ref 22–32)
CO2: 18 mmol/L — ABNORMAL LOW (ref 22–32)
Calcium: 7.6 mg/dL — ABNORMAL LOW (ref 8.9–10.3)
Calcium: 7.5 mg/dL — ABNORMAL LOW (ref 8.9–10.3)
Chloride: 111 mmol/L (ref 98–111)
Chloride: 115 mmol/L — ABNORMAL HIGH (ref 98–111)
Creatinine, Ser: 0.63 mg/dL (ref 0.44–1.00)
Creatinine, Ser: 0.67 mg/dL (ref 0.44–1.00)
GFR calc Af Amer: >60 mL/min (ref >60)
GFR calc Af Amer: >60 mL/min (ref >60)
GFR calc non Af Amer: >60 mL/min (ref >60)
GFR calc non Af Amer: >60 mL/min (ref >60)
Glucose, Bld: 125 mg/dL — ABNORMAL HIGH (ref 70–99)
Glucose, Bld: 162 mg/dL — ABNORMAL HIGH (ref 70–99)
Potassium: 3.8 mmol/L (ref 3.5–5.1)
Potassium: 3.3 mmol/L — ABNORMAL LOW (ref 3.5–5.1)
Sodium: 138 mmol/L (ref 135–145)
Sodium: 140 mmol/L (ref 135–145)

## 2019-12-26 LAB — GLUCOSE, CAPILLARY
Glucose-Capillary: 108 mg/dL — ABNORMAL HIGH (ref 70–99)
Glucose-Capillary: 103 mg/dL — ABNORMAL HIGH (ref 70–99)
Glucose-Capillary: 126 mg/dL — ABNORMAL HIGH (ref 70–99)
Glucose-Capillary: 151 mg/dL — ABNORMAL HIGH (ref 70–99)
Glucose-Capillary: 122 mg/dL — ABNORMAL HIGH (ref 70–99)

## 2019-12-26 LAB — VANCOMYCIN, TROUGH: Vancomycin Tr: 57 ug/mL (ref 15–20)

## 2019-12-26 LAB — MAGNESIUM: Magnesium: 1.7 mg/dL (ref 1.7–2.4)

## 2019-12-26 MED ORDER — IBUPROFEN 100 MG/5ML PO SUSP
400.0000 mg | Freq: Three times a day (TID) | ORAL | Status: DC | PRN
  Administered 2019-12-26: 400 mg via ORAL
  Filled 2019-12-26 (×2): qty 20

## 2019-12-26 MED ORDER — POLYETHYLENE GLYCOL 3350 17 G PO PACK
17.0000 g | PACK | Freq: Every day | ORAL | Status: DC
  Administered 2019-12-26 – 2020-01-06 (×10): 17 g via ORAL
  Filled 2019-12-26 (×9): qty 1

## 2019-12-26 MED ORDER — LACTATED RINGERS IV BOLUS
1000.0000 mL | Freq: Once | INTRAVENOUS | Status: AC
  Administered 2019-12-26: 1000 mL via INTRAVENOUS

## 2019-12-26 MED ORDER — JUVEN PO PACK
1.0000 | PACK | Freq: Two times a day (BID) | ORAL | Status: DC
  Administered 2019-12-26 – 2020-01-07 (×23): 1
  Filled 2019-12-26 (×23): qty 1

## 2019-12-26 MED ORDER — VANCOMYCIN VARIABLE DOSE PER UNSTABLE RENAL FUNCTION (PHARMACIST DOSING): Status: DC

## 2019-12-26 MED ORDER — DOCUSATE SODIUM 50 MG/5ML PO LIQD
100.0000 mg | Freq: Two times a day (BID) | ORAL | Status: DC
  Administered 2019-12-26 – 2019-12-28 (×5): 100 mg via ORAL
  Filled 2019-12-26 (×5): qty 10

## 2019-12-26 MED ORDER — PNEUMOCOCCAL VAC POLYVALENT 25 MCG/0.5ML IJ INJ
0.5000 mL | INJECTION | INTRAMUSCULAR | Status: AC | PRN
  Administered 2020-02-03: 0.5 mL via INTRAMUSCULAR
  Filled 2019-12-26 (×2): qty 0.5

## 2019-12-26 MED ORDER — DEXMEDETOMIDINE HCL IN NACL 200 MCG/50ML IV SOLN
0.0000 ug/kg/h | INTRAVENOUS | Status: DC
  Administered 2019-12-26 (×3): 0.4 ug/kg/h via INTRAVENOUS
  Administered 2019-12-27: 0.5 ug/kg/h via INTRAVENOUS
  Administered 2019-12-27: 0.4 ug/kg/h via INTRAVENOUS
  Administered 2019-12-27 (×2): 0.6 ug/kg/h via INTRAVENOUS
  Administered 2019-12-28: 0.8 ug/kg/h via INTRAVENOUS
  Administered 2019-12-28: 0.784 ug/kg/h via INTRAVENOUS
  Filled 2019-12-26: qty 100
  Filled 2019-12-26 (×10): qty 50

## 2019-12-26 MED ORDER — FENTANYL CITRATE (PF) 100 MCG/2ML IJ SOLN
50.0000 ug | INTRAMUSCULAR | Status: DC | PRN
  Filled 2019-12-26: qty 2

## 2019-12-26 MED ORDER — FENTANYL CITRATE (PF) 100 MCG/2ML IJ SOLN
50.0000 ug | INTRAMUSCULAR | Status: DC | PRN
  Administered 2019-12-26: 50 ug via INTRAVENOUS
  Administered 2019-12-27: 100 ug via INTRAVENOUS
  Administered 2019-12-27: 50 ug via INTRAVENOUS
  Administered 2019-12-27 – 2019-12-30 (×12): 100 ug via INTRAVENOUS
  Administered 2019-12-30: 50 ug via INTRAVENOUS
  Administered 2019-12-30 – 2019-12-31 (×6): 100 ug via INTRAVENOUS
  Administered 2019-12-31: 200 ug via INTRAVENOUS
  Administered 2019-12-31: 100 ug via INTRAVENOUS
  Administered 2019-12-31 – 2020-01-02 (×19): 200 ug via INTRAVENOUS
  Administered 2020-01-03 (×2): 100 ug via INTRAVENOUS
  Administered 2020-01-03 (×2): 200 ug via INTRAVENOUS
  Administered 2020-01-04 – 2020-01-05 (×3): 100 ug via INTRAVENOUS
  Administered 2020-01-05: 50 ug via INTRAVENOUS
  Administered 2020-01-06: 100 ug via INTRAVENOUS
  Administered 2020-01-06: 200 ug via INTRAVENOUS
  Administered 2020-01-06: 100 ug via INTRAVENOUS
  Administered 2020-01-07: 200 ug via INTRAVENOUS
  Filled 2019-12-26 (×3): qty 4
  Filled 2019-12-26: qty 2
  Filled 2019-12-26 (×3): qty 4
  Filled 2019-12-26 (×2): qty 2
  Filled 2019-12-26 (×2): qty 4
  Filled 2019-12-26 (×4): qty 2
  Filled 2019-12-26: qty 4
  Filled 2019-12-26 (×4): qty 2
  Filled 2019-12-26: qty 4
  Filled 2019-12-26 (×3): qty 2
  Filled 2019-12-26 (×2): qty 4
  Filled 2019-12-26: qty 2
  Filled 2019-12-26: qty 4
  Filled 2019-12-26 (×2): qty 2
  Filled 2019-12-26 (×2): qty 4
  Filled 2019-12-26 (×3): qty 2
  Filled 2019-12-26 (×4): qty 4
  Filled 2019-12-26 (×2): qty 2
  Filled 2019-12-26 (×4): qty 4
  Filled 2019-12-26 (×2): qty 2
  Filled 2019-12-26: qty 4
  Filled 2019-12-26 (×2): qty 2
  Filled 2019-12-26: qty 4
  Filled 2019-12-26: qty 2
  Filled 2019-12-26: qty 4
  Filled 2019-12-26: qty 2
  Filled 2019-12-26: qty 4
  Filled 2019-12-26 (×2): qty 2

## 2019-12-26 MED ORDER — MAGNESIUM SULFATE 2 GM/50ML IV SOLN
2.0000 g | Freq: Once | INTRAVENOUS | Status: AC
  Administered 2019-12-26: 2 g via INTRAVENOUS
  Filled 2019-12-26: qty 50

## 2019-12-26 MED ORDER — IBUPROFEN 200 MG PO TABS: 400.0000 mg | ORAL_TABLET | Freq: Three times a day (TID) | ORAL | Status: DC | PRN

## 2019-12-26 MED ORDER — ALBUMIN HUMAN 25 % IV SOLN
50.0000 g | Freq: Once | INTRAVENOUS | Status: AC
  Administered 2019-12-26: 50 g via INTRAVENOUS
  Filled 2019-12-26: qty 200

## 2019-12-26 NOTE — Consult Note (Addendum)
[facility] Nurse ostomy consult note Surgical team following for assessment and plan of care to buttock/sacrum wound.   Pt had colostomy surgery yesterday.  Current pouch is leaking behind the barrier.  Pt is critically ill and unresponsive on the vent and no family members present; no teaching performed.  Stoma type/location:  Stoma is red and moist, below skin level, red rubber rod in place, 1 1/2 inches Peristomal assessment: intact skin surrounding Output: no stool or flatus, small amt bloody liquid in the pouch  Ostomy pouching: 1pc.  Education provided:  Applied barrier ring to attempt to maintain a seal, and one piece flexible convex pouch.  Educational materials left in the room and 3 sets of barrier rings and pouches at the bedside for staff nurse use.  [place] team will continue to follow and will begin teaching sessions when pt is stable and out of ICU.  Enrolled patient in [place] program: NOT YET. Julien Girt MSN, RN, [place], [place], [place]

## 2019-12-26 NOTE — Progress Notes (Signed)
Pharmacy - Vancomycin  Assessment:    Please see note from Lavonia Drafts) Irven Easterly, PharmD earlier today for full details.  Briefly, 49 y.o. female on vancomycin for necrotizing infection of L buttock   Vancomycin trough drawn appropriately returned at 57 mcg/mL  SCr stable WNL and at baseline  Plan:   Discontinue vancomycin  Recheck random vancomycin level on 6/13  Daily SCr  Reuel Boom, PharmD, BCPS [phone] 12/26/2019, 7:44 PM

## 2019-12-26 NOTE — Progress Notes (Signed)
[facility] Progress Note  1 Day Post-Op  Subjective: CC-  On the vent.  Objective: Vital signs in last 24 hours: Temp:  [96.6 F (35.9 C)-99.4 F (37.4 C)] 97.7 F (36.5 C) (06/11 0800) Pulse Rate:  [61-102] 84 (06/11 0700) Resp:  [0-26] 20 (06/11 0700) BP: (77-101)/(46-71) 95/60 (06/11 0700) SpO2:  [98 %-100 %] 100 % (06/11 0726) Arterial Line BP: (65-140)/(34-88) 96/53 (06/11 0700) FiO2 (%):  [30 %] 30 % (06/11 0726) Weight:  [102 kg] 102 kg (06/11 0500) Last BM Date: 12/25/19  Intake/Output from previous day: 06/10 0701 - 06/11 0700 In: 6028.2 [I.V.:3813.1; IV Piggyback:2215.2] Out: 630 [Urine:505; Blood:25] Intake/Output this shift: No intake/output data recorded.  PE: Gen:  Sedated on the vent HEENT: eyes open Pulm: mechanically ventilated Abd: Soft, NT/ND, ostomy viable with red rubber catheter in place and new/clean pouch Ext: edema BUE/BLE GU: there is one deep area perianal with some fibrinous tissue and drainage     Lab Results:  Recent Labs    12/25/19 0400 12/25/19 1618 12/25/19 1726 12/26/19 0500  WBC 24.6*  --   --  19.7*  HGB 9.3*   < > 8.5* 8.8*  HCT 26.8*   < > 25.0* 26.2*  PLT 88*  --   --  89*   < > = values in this interval not displayed.   BMET Recent Labs    12/25/19 2033 12/26/19 0500  NA 140 138  K 3.9 3.8  CL 115* 111  CO2 15* 16*  GLUCOSE 116* 125*  BUN 29* 29*  CREATININE 0.65 0.63  CALCIUM 7.8* 7.6*   PT/INR Recent Labs    12/23/19 1221 12/23/19 2145  LABPROT 15.4* 16.2*  INR 1.3* 1.4*   CMP     Component Value Date/Time   NA 138 12/26/2019 0500   K 3.8 12/26/2019 0500   CL 111 12/26/2019 0500   CO2 16 (L) 12/26/2019 0500   GLUCOSE 125 (H) 12/26/2019 0500   BUN 29 (H) 12/26/2019 0500   CREATININE 0.63 12/26/2019 0500   CALCIUM 7.6 (L) 12/26/2019 0500   PROT 4.5 (L) 12/25/2019 0400   ALBUMIN 1.2 (L) 12/25/2019 0400   AST 42 (H) 12/25/2019 0400   ALT 34 12/25/2019 0400   ALKPHOS 266 (H)  12/25/2019 0400   BILITOT 1.9 (H) 12/25/2019 0400   GFRNONAA >60 12/26/2019 0500   GFRAA >60 12/26/2019 0500   Lipase  No results found for: LIPASE     Studies/Results: DG CHEST PORT 1 VIEW  Result Date: 12/25/2019 CLINICAL DATA:  Acute respiratory failure, hypoxemia, history of I and D of left buttock abscess EXAM: PORTABLE CHEST 1 VIEW COMPARISON:  12/25/2019 4:26 a.m. FINDINGS: The single frontal view of the chest demonstrates endotracheal tube, enteric catheter, and right internal jugular catheter unchanged. Cardiac silhouette is stable. There is persistent bibasilar consolidation without effusion or pneumothorax. IMPRESSION: 1. Stable support devices. 2. Persistent bibasilar consolidation, favor atelectasis.  The Electronically Signed   By: Randa Ngo M.D.   On: 12/25/2019 19:42   DG Chest Port 1 View  Result Date: 12/25/2019 CLINICAL DATA:  Hypoxia EXAM: PORTABLE CHEST 1 VIEW COMPARISON:  December 23, 2019 FINDINGS: Endotracheal tube tip is 3.0 cm above the carina. Nasogastric tube tip and side port are below the diaphragm. Central catheter tip is in the superior vena cava. No pneumothorax. There is bibasilar atelectasis. The lungs elsewhere are clear. Heart size and pulmonary vascularity are within normal limits. No adenopathy. No bone lesions.  IMPRESSION: Tube and catheter positions as described without pneumothorax. Bibasilar atelectasis. Lungs otherwise clear. Heart upper normal in size. Electronically Signed   By: Lowella Grip III M.D.   On: 12/25/2019 07:52    Anti-infectives: Anti-infectives (From admission, onward)   Start     Dose/Rate Route Frequency Ordered Stop   12/23/19 2000  vancomycin (VANCOCIN) IVPB 1000 mg/200 mL premix     Discontinue     1,000 mg 200 mL/hr over 60 Minutes Intravenous Every 8 hours 12/23/19 1027     12/23/19 1300  clindamycin (CLEOCIN) IVPB 900 mg     Discontinue     900 mg 100 mL/hr over 30 Minutes Intravenous Every 8 hours 12/23/19 1205       12/23/19 1200  metroNIDAZOLE (FLAGYL) IVPB 500 mg  Status:  Discontinued        500 mg 100 mL/hr over 60 Minutes Intravenous Every 8 hours 12/23/19 1135 12/23/19 1254   12/23/19 1045  vancomycin (VANCOREADY) IVPB 2000 mg/400 mL        2,000 mg 200 mL/hr over 120 Minutes Intravenous NOW 12/23/19 1018 12/23/19 1441   12/23/19 0300  cefTRIAXone (ROCEPHIN) 2 g in sodium chloride 0.9 % 100 mL IVPB     Discontinue     2 g 200 mL/hr over 30 Minutes Intravenous Every 24 hours 12/23/19 0236         Assessment/Plan Hepatic encephalopathy Alcohol abuse Thrombocytopenia  HTN  Sepsis  Necrotizing fasciitis of left buttock  -S/P incision and debridement of skin, subcutaneous tissue, fascia and muscle 15x15x6cm, left buttock 5/8 Dr. Romana Juniper  -S/P Debridement of skin, subcutaneous tissue, fascia and muscle left buttock, pulsatile lavage LAPAROSCOPIC LOOP DESCENDING COLOSTOMY 6/10 Dr. Redmond Pulling - ongoing management of respiratory failure and septic shock per CCM, appreciate their care - Continue IV abx  - No further plans for OR over the weekend unless she becomes more ill. Will start BID wet to dry dressing changes and ask PT to see for hydrotherapy. Ok to restart TF. Red rubber catheter under stoma to be removed 4-5 days postop.  FEN: NPO, IVF, ok for TF ID: Rocephin, Clindamycin, Vancomycin VTE: SCD's Foley: placed 6/8   LOS: 5 days    Wellington Hampshire, [facility] 12/26/2019, 10:11 AM Please see Amion for pager number during day hours 7:00am-4:30pm

## 2019-12-26 NOTE — Progress Notes (Signed)
Pharmacy Antibiotic Note  Alyssa Carey is a 49 y.o. female admitted on 12/21/2019 with wound infection.  Pharmacy has been consulted for vancomycin dosing.  Pt is 14yoF with PMH significant for EtOH abuse. Pt admitted with multiple stage II wounds on left buttock.  Today, 12/26/19 -WBC 19.7- improved today -SCr 0.63, CrCl >124m/min -Tm 99.26F -Cx data pending: Polymicrobial wound per gram stain; + proteus  Plan:  Continue Vancomycin 1000 mg IV q8h   Goal VT 15-20 mcg/mL  Ceftriaxone 2 g IV daily per MD  Clindamycin 9013mIV q8h  Follow renal function, culture data.   Check Vancomycin trough prior to 4p dose  Height: 5' 4"  (162.6 cm) Weight: 102 kg (224 lb 13.9 oz) IBW/kg (Calculated) : 54.7  Temp (24hrs), Avg:98 F (36.7 C), Min:96.6 F (35.9 C), Max:99.4 F (37.4 C)  Recent Labs  Lab 12/21/19 2231 12/22/19 0136 12/23/19 0140 12/23/19 0140 12/23/19 1039 12/23/19 1039 12/23/19 2145 12/24/19 0600 12/24/19 1008 12/24/19 1215 12/25/19 0400 12/25/19 2033 12/26/19 0500  WBC  --    < > 15.8*   < > 15.6*  --  16.9* 21.2*  --   --  24.6*  --  19.7*  CREATININE  --    < > 0.84   < > 0.75   < > 0.59 0.68  --   --  0.69 0.65 0.63  LATICACIDVEN 2.9*  --  3.3*  --  2.3*  --   --   --  2.3* 2.1*  --   --   --    < > = values in this interval not displayed.    Estimated Creatinine Clearance: 102.1 mL/min (by C-G formula based on SCr of 0.63 mg/dL).    Allergies  Allergen Reactions  . Penicillins     Did it involve swelling of the face/tongue/throat, SOB, or low BP? N Did it involve sudden or severe rash/hives, skin peeling, or any reaction on the inside of your mouth or nose? Y Did you need to seek medical attention at a hospital or doctor's office? N When did it last happen?Childhood If all above answers are "NO", may proceed with cephalosporin use.     Antimicrobials this admission: ceftriaxone 6/8 >>  vancomycin 6/8 >>   Dose adjustments this  admission:  Microbiology results: 6/8 BCx: ngtd 6/8 UCx: ngtd   Thank you for allowing pharmacy to be a part of this patient's care.  MiNetta CedarsPharmD 12/26/2019 8:43 AM

## 2019-12-26 NOTE — Progress Notes (Signed)
Vanc trough lab pulled at 15:45 for this patient. Scheduled vancomycin dose started at 1705 per order. Lab called at 1740 to let this RN know of critical value vanc trough of 57. Pharmacy immediately called and current vancomycin infusion stopped mid way through.

## 2019-12-26 NOTE — Progress Notes (Addendum)
NAME:  Alyssa Carey, MRN:  [id number], DOB:  08/11/1970, LOS: 5 ADMISSION DATE:  12/21/2019, CONSULTATION DATE:  6/8 REFERRING MD:  Kshitiz, CHIEF COMPLAINT:  Confusion   Brief History   49 y/o female with a heavy alcohol history admitted in the setting of confusion and presumed alcohol withdrawal who later developed sepsis from a gas forming wound.    Past Medical History  Alcohol abuse  Significant Hospital Events   6/6 admission 6/8 ICU admission to OR ofr I&D, back intubated and on pressors, 6/9 started tube feeds. Still pressor dependent.kept on vent w/ anticipated return to OR planned  6/10: Back to the OR for significant debridement and also placement of diverting colostomy 6/11: Still sedated.  Hemodynamically still requiring low-dose phenylephrine.  Surgery felt wound looking good. Plan to initiate weaning. Sedation changed to precedex and PRN fent  Consults:  General surgery PCCM  Procedures:   oett 6/8>>> Right IJ CVL 6/8>>> Radial aline left 6/8>>  Significant Diagnostic Tests:  6/6 CT head > NAICP 6/7 U/S abdomen > hepatic steatosis 6/8 CT Pelvis > gas throughout medial subcutaneous soft tissues of left buttock into the left perineum  Micro Data:  6/6 SARS COV 2 > neg 6/6 blood culture >  6/8 urine > mult orgs  6/8 blood >  6/8: abd Proteus, but send for reincubation>>>  Antimicrobials:  6/7 ceftriaxone >  6/8 vanc >  6/8 flagyl > 6/9 6/8 clinda >   Interim history/subjective:  Remains sedated   Objective   Blood pressure 95/60, pulse 84, temperature 99.4 F (37.4 C), temperature source Axillary, resp. rate 20, height 5' 4"  (1.626 m), weight 102 kg, last menstrual period 03/23/2019, SpO2 100 %. CVP:  [8 mmHg-17 mmHg] 17 mmHg  Vent Mode: PRVC FiO2 (%):  [30 %] 30 % Set Rate:  [20 bmp] 20 bmp Vt Set:  [440 mL] 440 mL PEEP:  [5 cmH20] 5 cmH20 Plateau Pressure:  [16 cmH20-21 cmH20] 20 cmH20   Intake/Output Summary (Last 24 hours) at 12/26/2019  0821 Last data filed at 12/26/2019 0643 Gross per 24 hour  Intake 5632.95 ml  Output 630 ml  Net 5002.95 ml   Filed Weights   12/24/19 0500 12/25/19 0500 12/26/19 0500  Weight: 94.4 kg 94.4 kg 102 kg    Examination:  General: 49 year old black female currently sedated on propofol drip appears comfortable HEENT normocephalic atraumatic orally intubated right IJ triple-lumen catheter in place dressing clean dry and intact Pulmonary: Clear to auscultation currently on full support Cardiac: Regular rate and rhythm Abdomen: Soft.  Ostomy stoma red and blanchable, red rubber rod in place.  Hypoactive bowel sounds GU: Clear yellow Neuro: Sedated Extremities: Warm, brisk capillary refill   Resolved Hospital Problem list     Assessment & Plan:   Severe sepsis/septic shock in setting of necrotizing fasciitis (present on admission): Now status post incision and debridement of skin, subcutaneous tissue, fascia and muscle of the left buttocks 6/8 She went back to the operating room once again on 6/10 For further debridement and placement of diverting colostomy  Preliminary growing protease however cultures have been reintubated Plan Continue to keep euvolemic, currently CVP on high side  Titrate vasoactive drips for mean arterial pressure greater than 65, I suspect some of her hypotension is sedation related  Continue wound care per surgical team with plan for back to the operating room once again on 6/12  Day #5 ceftriaxone and day #4 of clindamycin and vancomycin , As cultures  are still pending we should continue current regimen given risk of polymicrobial process  Acute hypoxic respiratory failure.  Atelectasis Tubes/lines good position R>L atx  Plan Continue full ventilator support, have changed respiratory rate 16 to let her set some of her own minute ventilation during rest and attempt to assess for SBT today  Continue VAP bundle PAD protocol, RASS goal 0 A.m. chest  x-ray  Fluid and Electrolyte imbalance none anion gap metabolic acidosis in setting of hyperchloremia, intermittent hypokalemia Acidosis slowly improving Plan Continue LR And chemistries  Hepatic encephalopathy due to hepatic, alcoholic steatosis> worsening in setting of sepsis Plan Continuing thiamine and folate  Hold lactulose for now, resume once okay with surgical team  PAD protocol RASS goal 0  Hyperglycemia:  Plan Sliding scale insulin  Alcohol abuse: Plan We will need abuse counseling following extubation  Thrombocytopenia due to alcohol abuse: - Did receive platelets intraoperatively, holding overnight on 6/9, these have held over 24 hours Plan Continue to trend  Alcoholic Hepatitis: improving Plan A.m. LFTs  Protein calorie malnutrition Plan Tube feeds when able to resume per surgical team  Code status: Full code for now, will need to continue to discuss this Best practice:  Nutrition: Start tube feeds 6/9 Sedation: Protocol initiated 6/8 PUD: Start PPI 6/9 Glycemic control: Initiate glycemic protocol 6/9 DVT: SCDs  CODE STATUS: Full code Disposition continue in intensive care.  Continue full ventilatory support, let things stabilize.  Plan for back to the operating room on 6/10   Labs   CBC: Recent Labs  Lab 12/23/19 0140 12/23/19 0140 12/23/19 1039 12/23/19 2016 12/23/19 2145 12/23/19 2145 12/24/19 0600 12/25/19 0400 12/25/19 1618 12/25/19 1726 12/26/19 0500  WBC 15.8*   < > 15.6*  --  16.9*  --  21.2* 24.6*  --   --  19.7*  NEUTROABS 14.3*  --  13.6*  --   --   --  17.9*  --   --   --   --   HGB 13.1   < > 12.4   < > 10.8*   < > 10.3* 9.3* 9.5* 8.5* 8.8*  HCT 37.2   < > 35.5*   < > 30.4*   < > 29.9* 26.8* 28.0* 25.0* 26.2*  MCV 93.7   < > 93.9  --  94.1  --  93.7 94.0  --   --  97.0  PLT 31*   < > 32*  --  92*  --  97* 88*  --   --  89*   < > = values in this interval not displayed.    Basic Metabolic Panel: Recent Labs  Lab  12/22/19 0136 12/23/19 0140 12/23/19 2145 12/24/19 0600 12/24/19 0600 12/24/19 1008 12/24/19 1706 12/25/19 0400 12/25/19 1618 12/25/19 1726 12/25/19 1847 12/25/19 2033 12/26/19 0500  NA 133*   < > 139 137   < >  --   --  138 142 145  --  140 138  K 3.5   < > 2.4* 3.4*   < >  --   --  2.9* 3.9 3.8  --  3.9 3.8  CL 102   < > 113* 112*  --   --   --  114*  --   --   --  115* 111  CO2 16*   < > 16* 13*  --   --   --  15*  --   --   --  15* 16*  GLUCOSE 126*   < >  118* 171*  --   --   --  125*  --   --   --  116* 125*  BUN 39*   < > 27* 26*  --   --   --  28*  --   --   --  29* 29*  CREATININE 1.32*   < > 0.59 0.68  --   --   --  0.69  --   --   --  0.65 0.63  CALCIUM 7.9*   < > 7.0* 7.0*  --   --   --  7.4*  --   --   --  7.8* 7.6*  MG 2.3   < >  --  1.7   < > 1.7 2.2 1.9  --   --  1.8  --  1.7  PHOS 3.6  --   --   --   --  3.9 3.0 2.6  --   --  4.2  --   --    < > = values in this interval not displayed.   GFR: Estimated Creatinine Clearance: 102.1 mL/min (by C-G formula based on SCr of 0.63 mg/dL). Recent Labs  Lab 12/23/19 0140 12/23/19 0140 12/23/19 1039 12/23/19 1039 12/23/19 2145 12/24/19 0600 12/24/19 1008 12/24/19 1215 12/25/19 0400 12/26/19 0500  WBC 15.8*   < > 15.6*   < > 16.9* 21.2*  --   --  24.6* 19.7*  LATICACIDVEN 3.3*  --  2.3*  --   --   --  2.3* 2.1*  --   --    < > = values in this interval not displayed.    Liver Function Tests: Recent Labs  Lab 12/23/19 0140 12/23/19 1039 12/23/19 2145 12/24/19 0600 12/25/19 0400  AST 159* 123* 81* 64* 42*  ALT 68* 62* 51* 46* 34  ALKPHOS 311* 318* 268* 264* 266*  BILITOT 3.4* 2.8* 2.3* 2.1* 1.9*  PROT 5.3* 5.1* 4.6* 4.6* 4.5*  ALBUMIN 1.6* 1.5* 1.4* 1.4* 1.2*   No results for input(s): LIPASE, AMYLASE in the last 168 hours. Recent Labs  Lab 12/21/19 1658 12/22/19 0136 12/23/19 1039 12/24/19 0630  AMMONIA 76* 77* 76* 64*    ABG    Component Value Date/Time   PHART 7.232 (L) 12/25/2019 1726    PCO2ART 38.5 12/25/2019 1726   PO2ART 143 (H) 12/25/2019 1726   HCO3 16.2 (L) 12/25/2019 1726   TCO2 17 (L) 12/25/2019 1726   ACIDBASEDEF 10.0 (H) 12/25/2019 1726   O2SAT 99.0 12/25/2019 1726     Coagulation Profile: Recent Labs  Lab 12/22/19 0136 12/23/19 1221 12/23/19 2145  INR 1.3* 1.3* 1.4*    Cardiac Enzymes: No results for input(s): CKTOTAL, CKMB, CKMBINDEX, TROPONINI in the last 168 hours.  HbA1C: Hgb A1c MFr Bld  Date/Time Value Ref Range Status  12/24/2019 10:08 AM 5.7 (H) 4.8 - 5.6 % Final    Comment:    (NOTE) Pre diabetes:          5.7%-6.4% Diabetes:              >6.4% Glycemic control for   <7.0% adults with diabetes     CBG: Recent Labs  Lab 12/25/19 1851 12/25/19 2008 12/25/19 2351 12/26/19 0423 12/26/19 0817  GLUCAP 106* 116* 105* 108* 103*     Critical care time: 32 minutes   Erick Colace ACNP-BC [place] Pager # [phone] OR # [phone] if no answer

## 2019-12-26 NOTE — Progress Notes (Signed)
[facility] Progress Note Patient Name: Alyssa Carey DOB: 1971/03/21 MRN: [id number]   Date of Service  12/26/2019  HPI/Events of Note  Patient with left buttock abscess s /p I & D. She has soft blood pressures and oliguria, serum albumin is 1.2, hemoglobin is 8.5 gm %.  eICU Interventions  Lactated Ringers 1000 ml + Albumin 25 % 50 gm iv x 1.        Kerry Kass Krishiv Sandler 12/26/2019, 5:17 AM

## 2019-12-26 NOTE — Progress Notes (Signed)
Nutrition Follow-up  DOCUMENTATION CODES:   Obesity unspecified  INTERVENTION:  - will re-start TF regimen: Vital High Protein @ 40 ml/hr with 60 ml prostat QID. - will add juven BID to aid in wound healing.  - this regimen provides 1950 kcal, 209 grams protein, and 802 ml free water.  - free water flush, if desired, to be per CCM/CCS.    NUTRITION DIAGNOSIS:   Increased nutrient needs related to chronic illness, wound healing (alcohol abuse; necrotizing fasciitis) as evidenced by estimated needs. -ongoing  GOAL:   Patient will meet greater than or equal to 90% of their needs -to be met with TF regimen  MONITOR:   Vent status, TF tolerance, Labs, Weight trends, Skin  ASSESSMENT:   49 y.o. female past medical history of hypertension and alcohol abuse. She presented to [facility] emergency room on 12/21/2019 due to having generalized weakness, poor p.o. intake and hallucinations which  progressively worsened over the past week as reported by her son as documented by the ER physician.  Admitted with hepatic encephalopathy.  Significant Events: 6/7- admission; initial RD assessment 6/8- Rapid Response; CT pelvis to r/o necrotizing fascitis--found to be positive for necrotizing fascitis and went to OR for I&D 6/9- remained intubated with OGT in place; started TF; added ascorbic acid BID and zinc once/day.  6/10- return to OR for I&D; lap loop colostomy   Re-estimated kcal need based on weight on 6/9 (94.4 kg) as weight trending up since that time and flow sheet indicates mild edema to all extremities.   Patient returned to the OR yesterday. Able to talk with RN and reviewed Surgery note this AM. Plan to re-start TF. Plan to do hydrotherapy and no plan to return to the OR over the weekend.   Notes indicates worsening hepatic encephalopathy d/t hepatic, alcoholic steatosis and worse in the setting of sepsis.     Patient is currently intubated on ventilator support MV:  12.1 L/min Temp (24hrs), Avg:98 F (36.7 C), Min:96.6 F (35.9 C), Max:99.4 F (37.4 C) Propofol: none BP: 100/73 and MAP: 79  Labs reviewed; CBGs: 108 and 103 mg/dl, BUN: 29 mg/dl, Ca: 7.6 mg/dl. Medications reviewed; 500 mg ascorbic acid BID, 100 mg colace BID, 1 mg folvite/day, sliding scale novolog, 15 ml multivitamin per OGT/day, 17 g miralax/day, 10 mEq IV KCl x8 runs 6/10, 100 mg thiamine/day, 220 mg zinc sulfate per OGT/day.  IVF; LR @ 75 ml/hr.  Drips; precedex @ 0.4 mcg/kg/hr, nep @ 110 mcg/min.   Diet Order:   Diet Order            Diet NPO time specified  Diet effective now                 EDUCATION NEEDS:   Not appropriate for education at this time  Skin:  Skin Assessment: Skin Integrity Issues: Skin Integrity Issues:: Stage II, Stage IV, Incisions Stage II: L buttocks x6 Unstageable: L buttocks Incisions: L buttocks (6/8 + 6/10); abdomen (6/10)  Last BM:  6/10  Height:   Ht Readings from Last 1 Encounters:  12/23/19 5' 4" (1.626 m)    Weight:   Wt Readings from Last 1 Encounters:  12/26/19 102 kg    Estimated Nutritional Needs:  Kcal:  1918 Protein:  >/= 190 grams Fluid:  >/= 2 L/day     Jarome Matin, MS, RD, LDN, CNSC Inpatient Clinical Dietitian RD pager # available in [place]  After hours/weekend pager # available in [facility]

## 2019-12-26 NOTE — Progress Notes (Signed)
[facility] Progress Note Patient Name: Alyssa Carey DOB: 07-13-1971 MRN: [id number]   Date of Service  12/26/2019  HPI/Events of Note  Patient with a fever, she has abnormal LFT's and a normal creatinine.  eICU Interventions  Ibuprofen 400 mg via NG tube PRN fever ordered.        Frederik Pear 12/26/2019, 8:49 PM

## 2019-12-27 ENCOUNTER — Inpatient Hospital Stay (HOSPITAL_COMMUNITY): Payer: Medicaid Other

## 2019-12-27 LAB — COMPREHENSIVE METABOLIC PANEL
ALT: 21 U/L (ref 0–44)
AST: 32 U/L (ref 15–41)
Albumin: 1.9 g/dL — ABNORMAL LOW (ref 3.5–5.0)
Alkaline Phosphatase: 250 U/L — ABNORMAL HIGH (ref 38–126)
Anion gap: 8 (ref 5–15)
BUN: 39 mg/dL — ABNORMAL HIGH (ref 6–20)
CO2: 17 mmol/L — ABNORMAL LOW (ref 22–32)
Calcium: 7.8 mg/dL — ABNORMAL LOW (ref 8.9–10.3)
Chloride: 114 mmol/L — ABNORMAL HIGH (ref 98–111)
Creatinine, Ser: 0.72 mg/dL (ref 0.44–1.00)
GFR calc Af Amer: >60 mL/min (ref >60)
GFR calc non Af Amer: >60 mL/min (ref >60)
Glucose, Bld: 159 mg/dL — ABNORMAL HIGH (ref 70–99)
Potassium: 3.3 mmol/L — ABNORMAL LOW (ref 3.5–5.1)
Sodium: 139 mmol/L (ref 135–145)
Total Bilirubin: 2.2 mg/dL — ABNORMAL HIGH (ref 0.3–1.2)
Total Protein: 5.2 g/dL — ABNORMAL LOW (ref 6.5–8.1)

## 2019-12-27 LAB — GLUCOSE, CAPILLARY
Glucose-Capillary: 137 mg/dL — ABNORMAL HIGH (ref 70–99)
Glucose-Capillary: 139 mg/dL — ABNORMAL HIGH (ref 70–99)
Glucose-Capillary: 141 mg/dL — ABNORMAL HIGH (ref 70–99)
Glucose-Capillary: 147 mg/dL — ABNORMAL HIGH (ref 70–99)
Glucose-Capillary: 150 mg/dL — ABNORMAL HIGH (ref 70–99)
Glucose-Capillary: 159 mg/dL — ABNORMAL HIGH (ref 70–99)

## 2019-12-27 LAB — BLOOD GAS, ARTERIAL
Acid-base deficit: 6.6 mmol/L — ABNORMAL HIGH (ref 0.0–2.0)
Bicarbonate: 16.6 mmol/L — ABNORMAL LOW (ref 20.0–28.0)
Drawn by: 51425
FIO2: 30
MECHVT: 440 mL
O2 Saturation: 98.6 %
PEEP: 5 cmH2O
Patient temperature: 101.9
RATE: 16 resp/min
pCO2 arterial: 28.4 mmHg — ABNORMAL LOW (ref 32.0–48.0)
pH, Arterial: 7.394 (ref 7.350–7.450)
pO2, Arterial: 128 mmHg — ABNORMAL HIGH (ref 83.0–108.0)

## 2019-12-27 LAB — CBC
HCT: 23.7 % — ABNORMAL LOW (ref 36.0–46.0)
Hemoglobin: 8 g/dL — ABNORMAL LOW (ref 12.0–15.0)
MCH: 33.2 pg (ref 26.0–34.0)
MCHC: 33.8 g/dL (ref 30.0–36.0)
MCV: 98.3 fL (ref 80.0–100.0)
Platelets: 118 10*3/uL — ABNORMAL LOW (ref 150–400)
RBC: 2.41 MIL/uL — ABNORMAL LOW (ref 3.87–5.11)
RDW: 16.6 % — ABNORMAL HIGH (ref 11.5–15.5)
WBC: 20.4 10*3/uL — ABNORMAL HIGH (ref 4.0–10.5)
nRBC: 0.2 % (ref 0.0–0.2)

## 2019-12-27 LAB — TRIGLYCERIDES: Triglycerides: 71 mg/dL (ref <150)

## 2019-12-27 LAB — VANCOMYCIN, RANDOM: Vancomycin Rm: 39

## 2019-12-27 MED ORDER — LACTULOSE 10 GM/15ML PO SOLN
20.0000 g | Freq: Two times a day (BID) | ORAL | Status: DC
  Administered 2019-12-27 – 2019-12-29 (×6): 20 g via ORAL
  Filled 2019-12-27 (×6): qty 30

## 2019-12-27 MED ORDER — CHLORHEXIDINE GLUCONATE 0.12 % MT SOLN
OROMUCOSAL | Status: AC
  Filled 2019-12-27: qty 15

## 2019-12-27 MED ORDER — POTASSIUM CHLORIDE 10 MEQ/50ML IV SOLN
10.0000 meq | INTRAVENOUS | Status: AC
  Administered 2019-12-27 (×4): 10 meq via INTRAVENOUS
  Filled 2019-12-27 (×4): qty 50

## 2019-12-27 MED ORDER — POTASSIUM CHLORIDE CRYS ER 20 MEQ PO TBCR
20.0000 meq | EXTENDED_RELEASE_TABLET | ORAL | Status: AC
  Administered 2019-12-27 (×2): 20 meq via ORAL
  Filled 2019-12-27 (×2): qty 1

## 2019-12-27 NOTE — Progress Notes (Addendum)
PT- HYDROTHERAPY EVALUATION & TREATMENT  12/27/19 1500  Subjective Assessment  Subjective pt on vent, sedated   Patient and Family Stated Goals unable to state/on vent sedated   Date of Onset  (abcess present on admission)  Prior Treatments s/p surgical I & D x2, diverting colostomy  Evaluation and Treatment  Evaluation and Treatment Procedures Explained to Patient/Family Yes (pt opens eyes)  Evaluation and Treatment Procedures Patient unable to consent due to mental status  Wound / Incision (Open or Dehisced) 12/27/19 Non-pressure wound Buttocks Left *PT ONLY* open wound L gluteal area  Date First Assessed/Time First Assessed: 12/27/19 1410   Wound Type: Non-pressure wound  Location: Buttocks  Location Orientation: Left  Wound Description (Comments): *PT ONLY* open wound L gluteal area  Present on Admission: (c)   Dressing Type ABD;Moist to dry;Foam - Lift dressing to assess site every shift (foam over peri wound skin breakdown)  Dressing Changed Changed  Dressing Status Old drainage  Dressing Change Frequency Twice a day  Site / Wound Assessment Yellow;Red;Brown  % Wound base Red or Granulating 5%  % Wound base Yellow/Fibrinous Exudate 60%  % Wound base Black/Eschar 20%  % Wound base Other/Granulation Tissue (Comment) 15% (cauterized vessels/adipose tissue/rectum)  Peri-wound Assessment Black (area black necrotic appearing tissue at 9:00 & 12:00)  Wound Length (cm) 17 cm (+ 5 superficial potentially necrotic area at 12:00)  Wound Width (cm) 13 cm  Wound Depth (cm) 8 cm  Wound Volume (cm^3) 1768 cm^3  Wound Surface Area (cm^2) 221 cm^2  Tunneling (cm) mutliple tunnels ~ 8:00, 6:00  Margins Unattached edges (unapproximated)  Closure None  Drainage Amount Copious  Drainage Description Purulent;Odor  Non-staged Wound Description Not applicable  Treatment Cleansed;Hydrotherapy (Pulse lavage);Packing (Saline gauze)  Hydrotherapy  Pulsed lavage therapy - wound location L gluteal  area   Pulsed Lavage with Suction (psi) 12 psi  Pulsed Lavage with Suction - Normal Saline Used 1000 mL  Pulsed Lavage Tip Tip with splash shield  Wound Therapy - Assess/Plan/Recommendations  Wound Therapy - Clinical Statement Pt with extensive  open wound L gluteal area d/t necrotizing fasciitis, s/p surgical  I and D x2.   Wound Therapy - Functional Problem List immobility, sedated on vent   Factors Delaying/Impairing Wound Healing Substance abuse;Immobility;Multiple medical problems  Hydrotherapy Plan Debridement;Dressing change;Patient/family education;Pulsatile lavage with suction  Wound Therapy - Frequency 6X / week  Wound Therapy - Current Recommendations PT (when less sedated)  Wound Therapy - Follow Up Recommendations Skilled nursing facility  Wound Plan Pt will benefit from hydrotherapy as part a multi-modal/multi-disciplinary wound care  approach to facilitate healing and decr bioburden.   Wound Therapy Goals - Improve the function of patient's integumentary system by progressing the wound(s) through the phases of wound healing by:  Decrease Necrotic Tissue to 50  Decrease Necrotic Tissue - Progress Goal set today  Increase Granulation Tissue to 50  Increase Granulation Tissue - Progress Goal set today  Decrease Length/Width/Depth by (cm) -/-/3  Decrease Length/Width/Depth - Progress Goal set today  Improve Drainage Characteristics Mod  Improve Drainage Characteristics - Progress Goal set today  Goals/treatment plan/discharge plan were made with and agreed upon by patient/family No, Patient unable to participate in goals/treatment/discharge plan and family unavailable  Time For Goal Achievement Other (comment) (3 wks)  Wound Therapy - Potential for Goals Fair

## 2019-12-27 NOTE — Progress Notes (Addendum)
Alyssa Carey [id number] 14-Aug-1970  CARE TEAM:  PCP: Patient, No Pcp Per  Outpatient Care Team: Patient Care Team: Patient, No Pcp Per as PCP - General (General Practice)  Inpatient Treatment Team: Treatment Team: Attending Provider: Juanito Doom, MD; Technician: Ihor Dow, NT; Social Worker: Merri Brunette; Registered Nurse: Joselyn Glassman, RN; Technician: Berenice Bouton, NT; Rounding Team: Edison Pace, Md, MD; Rounding Team: Pccm, Md, MD; Nolic Nurse: Tenna Child, RN; Registered Nurse: Wray Kearns, RN; Physical Therapist: Neil Crouch, PT   Problem List:   Principal Problem:   Hepatic encephalopathy [facility]) Active Problems:   Fall at home, initial encounter   Weakness   Alcoholism ([place])   Thrombocytopenia ([place])   ARF (acute renal failure) ([place])   Alcoholic liver disease ([place])   Pressure injury of skin   Elevated LFTs   Acute respiratory failure ([place])   12/23/2019 Surgeon: Vikki Ports A ConnorMD  Procedure performed: incision and debridement of skin, subcutaneous tissue, fascia and muscle 15x15x6cm, left buttock  Preop diagnosis: necrotizing fasciitis  Post-op diagnosis/intraop findings: same  12/25/2019  PRE-OPERATIVE DIAGNOSIS: Necrotizing fasciitis of left buttock, need for fecal diversion  POST-OPERATIVE DIAGNOSIS: Same  PROCEDURE:   Debridement of skin, subcutaneous tissue, fascia and muscle left buttock, pulsatile lavage LAPAROSCOPIC LOOP DESCENDING COLOSTOMY  SURGEON: Greer Pickerel, MD  Assessment  Guarded but wounds stable to improving  [facility] Stay = 6 days)  Plan:  Fournier's gangrene/necrotizing fasciitis status post operative debridement x2 with fecal diversion.  Continue IV antibiotics.  ongoing management of respiratory failure and septic shock per CCM, appreciate their care  Continue IV abx agree with broad-spectrum control vancomycin/clindamycin/Rocephin given penicillin allergy  No further plans for OR  over the weekend unless she becomes more ill. BID wet to dry dressing changes and ask PT to see for hydrotherapy.   Ok to restart TF.  Red rubber catheter under stoma to be removed 4-5 days postop.  FEN: NPO, IVF, ok for TF ID: Rocephin, Clindamycin, Vancomycin VTE: SCD's Foley: placed 6/8 12/27/2019    Subjective: (Chief complaint)  Intubated sedated on vent.  On pressors.  Critical care following.  Objective:  Vital signs:  Vitals:   12/27/19 0400 12/27/19 0500 12/27/19 0600 12/27/19 0736  BP:    (!) 109/59  Pulse: 93 90 84 83  Resp: (!) 26 (!) 26 (!) 25 (!) 25  Temp: (!) 101.4 F (38.6 C) 98.4 F (36.9 C)    TempSrc: Axillary Oral    SpO2: 100% 100% 100% 100%  Weight:  105 kg    Height:        Last BM Date: 12/26/19  Intake/Output   Yesterday:  06/11 0701 - 06/12 0700 In: 3410.7 [I.V.:2057.9; NG/GT:900; IV Piggyback:452.7] Out: 1525 [Urine:1200; Stool:325] This shift:  No intake/output data recorded.  Bowel function:  Flatus: No  BM:  No  Drain: Colostomy in place with serous drainage   Physical Exam:  General: Pt intubated sedated on vent. Eyes: Sclera clear.  No icterus Neuro: Sedated Lymph: No head/neck/groin lymphadenopathy Chest: Coarse breath sounds bilaterally no pain to chest wall compression.  Good respiratory excursion.  No audible wheezing CV:  Pulses intact.  Regular rhythm.  No major extremity edema   Abdomen: Soft.  Mildy distended.  Nontender.  Colostomy left side flat but pink with bridge in place.  No flatus.  Serosanguineous drainage in bag most likely consistent with anasarca/edema.  no evidence of peritonitis.  No incarcerated hernias.  Large perineal wounds without any major necrosis or purulence Ext:   No deformity.  2+ edema.  No cyanosis     Results:   Cultures: Recent Results (from the past 720 hour(s))  SARS Coronavirus 2 by RT PCR (hospital order, performed in [facility] hospital lab) Nasopharyngeal  Nasopharyngeal Swab     Status: None   Collection Time: 12/21/19  6:26 PM   Specimen: Nasopharyngeal Swab  Result Value Ref Range Status   SARS Coronavirus 2 NEGATIVE NEGATIVE Final    Comment: (NOTE) SARS-CoV-2 target nucleic acids are NOT DETECTED. The SARS-CoV-2 RNA is generally detectable in upper and lower respiratory specimens during the acute phase of infection. The lowest concentration of SARS-CoV-2 viral copies this assay can detect is 250 copies / mL. A negative result does not preclude SARS-CoV-2 infection and should not be used as the sole basis for treatment or other patient management decisions.  A negative result may occur with improper specimen collection / handling, submission of specimen other than nasopharyngeal swab, presence of viral mutation(s) within the areas targeted by this assay, and inadequate number of viral copies (<250 copies / mL). A negative result must be combined with clinical observations, patient history, and epidemiological information. Fact Sheet for Patients:   StrictlyIdeas.no Fact Sheet for Healthcare Providers: BankingDealers.co.za This test is not yet approved or cleared  by the [place] FDA and has been authorized for detection and/or diagnosis of SARS-CoV-2 by FDA under an Emergency Use Authorization (EUA).  This EUA will remain in effect (meaning this test can be used) for the duration of the COVID-19 declaration under Section 564(b)(1) of the Act, 21 U.S.C. section 360bbb-3(b)(1), unless the authorization is terminated or revoked sooner. Performed at [facility] [place] [address] [place], [place] [address]   Urine Culture     Status: Abnormal   Collection Time: 12/22/19 11:12 AM   Specimen: Urine, Clean Catch  Result Value Ref Range Status   Specimen Description   Final    URINE, CLEAN CATCH Performed at [facility], [place] [address]  [place], [place] [address]    Special Requests   Final    NONE Performed at [facility], [place] [address], [place], [place] [address]    Culture MULTIPLE SPECIES PRESENT, SUGGEST RECOLLECTION (A)  Final   Report Status 12/23/2019 FINAL  Final  Culture, blood (routine x 2)     Status: None (Preliminary result)   Collection Time: 12/23/19  4:53 AM   Specimen: BLOOD RIGHT HAND  Result Value Ref Range Status   Specimen Description BLOOD RIGHT HAND  Final   Special Requests   Final    BOTTLES DRAWN AEROBIC ONLY Blood Culture adequate volume   Culture   Final    NO GROWTH 4 DAYS Performed at [facility], [place] [address] [place], [place] [address]    Report Status PENDING  Incomplete  Culture, blood (routine x 2)     Status: None (Preliminary result)   Collection Time: 12/23/19  4:53 AM   Specimen: BLOOD LEFT HAND  Result Value Ref Range Status   Specimen Description BLOOD LEFT HAND  Final   Special Requests   Final    BOTTLES DRAWN AEROBIC ONLY Blood Culture adequate volume   Culture   Final    NO GROWTH 4 DAYS Performed at [facility], [place] [address], [place], [place] [address]    Report Status PENDING  Incomplete  MRSA  PCR Screening     Status: None   Collection Time: 12/23/19  3:58 PM   Specimen: Nasal Mucosa; Nasopharyngeal  Result Value Ref Range Status   MRSA by PCR NEGATIVE NEGATIVE Final    Comment:        The GeneXpert MRSA Assay (FDA approved for NASAL specimens only), is one component of a comprehensive MRSA colonization surveillance program. It is not intended to diagnose MRSA infection nor to guide or monitor treatment for MRSA infections. Performed at [facility], [place] [address], [place], [place] [address]   Aerobic/Anaerobic Culture (surgical/deep wound)     Status: None (Preliminary result)   Collection Time: 12/23/19  7:26 PM   Specimen: PATH Other; Tissue  Result Value Ref Range Status   Specimen  Description   Final    WOUND LEFT BUTTOCKS Performed at [facility], [place] [address] [place], [place] [address]    Special Requests   Final    NONE Performed at [facility], [place] [address], [place], [place] [address]    Gram Stain   Final    NO WBC SEEN ABUNDANT GRAM POSITIVE COCCI ABUNDANT GRAM NEGATIVE RODS ABUNDANT GRAM POSITIVE RODS Performed at [facility], [place] [address] [place], [place] [address]    Culture   Final    FEW PROTEUS MIRABILIS CULTURE REINCUBATED FOR BETTER GROWTH NO ANAEROBES ISOLATED; CULTURE IN PROGRESS FOR 5 DAYS    Report Status PENDING  Incomplete   Organism ID, Bacteria PROTEUS MIRABILIS  Final      Susceptibility   Proteus mirabilis - MIC*    AMPICILLIN <=2 SENSITIVE Sensitive     CEFAZOLIN <=4 SENSITIVE Sensitive     CEFEPIME <=1 SENSITIVE Sensitive     CEFTAZIDIME <=1 SENSITIVE Sensitive     CEFTRIAXONE <=1 SENSITIVE Sensitive     CIPROFLOXACIN <=0.25 SENSITIVE Sensitive     GENTAMICIN <=1 SENSITIVE Sensitive     IMIPENEM 1 SENSITIVE Sensitive     TRIMETH/SULFA <=20 SENSITIVE Sensitive     AMPICILLIN/SULBACTAM <=2 SENSITIVE Sensitive     PIP/TAZO <=4 SENSITIVE Sensitive     * FEW PROTEUS MIRABILIS    Labs: Results for orders placed or performed during the hospital encounter of 12/21/19 (from the past 48 hour(s))  Blood gas, arterial     Status: Abnormal   Collection Time: 12/25/19  8:30 AM  Result Value Ref Range   FIO2 30.00    Delivery systems VENTILATOR    Mode PRESSURE REGULATED VOLUME CONTROL    VT 440 mL   LHR 20 resp/min   Peep/cpap +5 cm H20   pH, Arterial 7.388 7.35 - 7.45   pCO2 arterial 25.7 (L) 32 - 48 mmHg   pO2, Arterial 144 (H) 83 - 108 mmHg   Bicarbonate 15.2 (L) 20.0 - 28.0 mmol/L   Acid-base deficit 8.3 (H) 0.0 - 2.0 mmol/L   O2 Saturation 99.1 %   Patient temperature 98.6    Collection site A-LINE DRAW    Drawn by [phone]     Comment: Performed at [facility], [place] [address], [place], [place] [address]  Glucose, capillary     Status: None   Collection Time: 12/25/19 12:08 PM  Result Value Ref Range   Glucose-Capillary 91 70 - 99 mg/dL    Comment: Glucose reference range applies only to samples taken after fasting for at least 8 hours.   Comment 1 Notify RN    Comment  2 Document in Chart   I-STAT 7, (LYTES, BLD GAS, ICA, H+H)     Status: Abnormal   Collection Time: 12/25/19  4:18 PM  Result Value Ref Range   pH, Arterial 7.197 (LL) 7.35 - 7.45   pCO2 arterial 35.0 32 - 48 mmHg   pO2, Arterial 105 83 - 108 mmHg   Bicarbonate 13.6 (L) 20.0 - 28.0 mmol/L   TCO2 15 (L) 22 - 32 mmol/L   O2 Saturation 97.0 %   Acid-base deficit 13.0 (H) 0.0 - 2.0 mmol/L   Sodium 142 135 - 145 mmol/L   Potassium 3.9 3.5 - 5.1 mmol/L   Calcium, Ion 1.18 1.15 - 1.40 mmol/L   HCT 28.0 (L) 36 - 46 %   Hemoglobin 9.5 (L) 12.0 - 15.0 g/dL   Sample type ARTERIAL   I-STAT 7, (LYTES, BLD GAS, ICA, H+H)     Status: Abnormal   Collection Time: 12/25/19  5:26 PM  Result Value Ref Range   pH, Arterial 7.232 (L) 7.35 - 7.45   pCO2 arterial 38.5 32 - 48 mmHg   pO2, Arterial 143 (H) 83 - 108 mmHg   Bicarbonate 16.2 (L) 20.0 - 28.0 mmol/L   TCO2 17 (L) 22 - 32 mmol/L   O2 Saturation 99.0 %   Acid-base deficit 10.0 (H) 0.0 - 2.0 mmol/L   Sodium 145 135 - 145 mmol/L   Potassium 3.8 3.5 - 5.1 mmol/L   Calcium, Ion 1.15 1.15 - 1.40 mmol/L   HCT 25.0 (L) 36 - 46 %   Hemoglobin 8.5 (L) 12.0 - 15.0 g/dL   Sample type ARTERIAL   Magnesium     Status: None   Collection Time: 12/25/19  6:47 PM  Result Value Ref Range   Magnesium 1.8 1.7 - 2.4 mg/dL    Comment: Performed at [facility], [place] [address], [place], [place] [address]  Phosphorus     Status: None   Collection Time: 12/25/19  6:47 PM  Result Value Ref Range   Phosphorus 4.2 2.5 - 4.6 mg/dL    Comment: Performed at [facility], [place] [address], [place], [place]  [address]  Glucose, capillary     Status: Abnormal   Collection Time: 12/25/19  6:51 PM  Result Value Ref Range   Glucose-Capillary 106 (H) 70 - 99 mg/dL    Comment: Glucose reference range applies only to samples taken after fasting for at least 8 hours.   Comment 1 Notify RN    Comment 2 Document in Chart   Glucose, capillary     Status: Abnormal   Collection Time: 12/25/19  8:08 PM  Result Value Ref Range   Glucose-Capillary 116 (H) 70 - 99 mg/dL    Comment: Glucose reference range applies only to samples taken after fasting for at least 8 hours.  Basic metabolic panel     Status: Abnormal   Collection Time: 12/25/19  8:33 PM  Result Value Ref Range   Sodium 140 135 - 145 mmol/L   Potassium 3.9 3.5 - 5.1 mmol/L   Chloride 115 (H) 98 - 111 mmol/L   CO2 15 (L) 22 - 32 mmol/L   Glucose, Bld 116 (H) 70 - 99 mg/dL    Comment: Glucose reference range applies only to samples taken after fasting for at least 8 hours.   BUN 29 (H) 6 - 20 mg/dL   Creatinine, Ser 0.65 0.44 - 1.00 mg/dL   Calcium 7.8 (L) 8.9 - 10.3  mg/dL   GFR calc non Af Amer >60 >60 mL/min   GFR calc Af Amer >60 >60 mL/min   Anion gap 10 5 - 15    Comment: Performed at [facility], [place] [address] [place], [place] [address]  Glucose, capillary     Status: Abnormal   Collection Time: 12/25/19 11:51 PM  Result Value Ref Range   Glucose-Capillary 105 (H) 70 - 99 mg/dL    Comment: Glucose reference range applies only to samples taken after fasting for at least 8 hours.   Comment 1 Notify RN    Comment 2 Document in Chart   Glucose, capillary     Status: Abnormal   Collection Time: 12/26/19  4:23 AM  Result Value Ref Range   Glucose-Capillary 108 (H) 70 - 99 mg/dL    Comment: Glucose reference range applies only to samples taken after fasting for at least 8 hours.   Comment 1 Notify RN    Comment 2 Document in Chart   Triglycerides     Status: None   Collection Time: 12/26/19  5:00 AM  Result Value  Ref Range   Triglycerides 106 <150 mg/dL    Comment: Performed at [facility], [place] [address], [place], [place] [address]  CBC     Status: Abnormal   Collection Time: 12/26/19  5:00 AM  Result Value Ref Range   WBC 19.7 (H) 4.0 - 10.5 K/uL   RBC 2.70 (L) 3.87 - 5.11 MIL/uL   Hemoglobin 8.8 (L) 12.0 - 15.0 g/dL   HCT 26.2 (L) 36 - 46 %   MCV 97.0 80.0 - 100.0 fL   MCH 32.6 26.0 - 34.0 pg   MCHC 33.6 30.0 - 36.0 g/dL   RDW 16.4 (H) 11.5 - 15.5 %   Platelets 89 (L) 150 - 400 K/uL    Comment: CONSISTENT WITH PREVIOUS RESULT   nRBC 0.0 0.0 - 0.2 %    Comment: Performed at [facility], [place] [address], [place], [place] [address]  Basic metabolic panel     Status: Abnormal   Collection Time: 12/26/19  5:00 AM  Result Value Ref Range   Sodium 138 135 - 145 mmol/L   Potassium 3.8 3.5 - 5.1 mmol/L   Chloride 111 98 - 111 mmol/L   CO2 16 (L) 22 - 32 mmol/L   Glucose, Bld 125 (H) 70 - 99 mg/dL    Comment: Glucose reference range applies only to samples taken after fasting for at least 8 hours.   BUN 29 (H) 6 - 20 mg/dL   Creatinine, Ser 0.63 0.44 - 1.00 mg/dL   Calcium 7.6 (L) 8.9 - 10.3 mg/dL   GFR calc non Af Amer >60 >60 mL/min   GFR calc Af Amer >60 >60 mL/min   Anion gap 11 5 - 15    Comment: Performed at [facility], [place] [address] [place], [place] [address]  Magnesium     Status: None   Collection Time: 12/26/19  5:00 AM  Result Value Ref Range   Magnesium 1.7 1.7 - 2.4 mg/dL    Comment: Performed at [facility], [place] [address], [place], [place] [address]  Glucose, capillary     Status: Abnormal   Collection Time: 12/26/19  8:17 AM  Result Value Ref Range   Glucose-Capillary 103 (H) 70 - 99 mg/dL    Comment: Glucose reference range applies only to samples taken after fasting for at  least 8 hours.   Comment 1 Notify RN    Comment 2 Document in Chart   Glucose, capillary     Status: Abnormal    Collection Time: 12/26/19 12:10 PM  Result Value Ref Range   Glucose-Capillary 126 (H) 70 - 99 mg/dL    Comment: Glucose reference range applies only to samples taken after fasting for at least 8 hours.   Comment 1 Notify RN    Comment 2 Document in Chart   Vancomycin, trough     Status: Abnormal   Collection Time: 12/26/19  3:45 PM  Result Value Ref Range   Vancomycin Tr 57 (HH) 15 - 20 ug/mL    Comment: CRITICAL RESULT CALLED TO, READ BACK BY AND VERIFIED WITH: H.ROMINES,RN [id number] @1740  BY V.WILKINS Performed at [facility], [place] [address], [place], [place] [address]   Glucose, capillary     Status: Abnormal   Collection Time: 12/26/19  4:16 PM  Result Value Ref Range   Glucose-Capillary 151 (H) 70 - 99 mg/dL    Comment: Glucose reference range applies only to samples taken after fasting for at least 8 hours.   Comment 1 Notify RN    Comment 2 Document in Chart   Basic metabolic panel     Status: Abnormal   Collection Time: 12/26/19  5:57 PM  Result Value Ref Range   Sodium 140 135 - 145 mmol/L   Potassium 3.3 (L) 3.5 - 5.1 mmol/L   Chloride 115 (H) 98 - 111 mmol/L   CO2 18 (L) 22 - 32 mmol/L   Glucose, Bld 162 (H) 70 - 99 mg/dL    Comment: Glucose reference range applies only to samples taken after fasting for at least 8 hours.   BUN 32 (H) 6 - 20 mg/dL   Creatinine, Ser 0.67 0.44 - 1.00 mg/dL   Calcium 7.5 (L) 8.9 - 10.3 mg/dL   GFR calc non Af Amer >60 >60 mL/min   GFR calc Af Amer >60 >60 mL/min   Anion gap 7 5 - 15    Comment: Performed at [facility], [place] [address], [place], [place] [address]  Glucose, capillary     Status: Abnormal   Collection Time: 12/26/19  8:14 PM  Result Value Ref Range   Glucose-Capillary 122 (H) 70 - 99 mg/dL    Comment: Glucose reference range applies only to samples taken after fasting for at least 8 hours.   Comment 1 Notify RN    Comment 2 Document in Chart   Glucose, capillary     Status:  Abnormal   Collection Time: 12/27/19 12:03 AM  Result Value Ref Range   Glucose-Capillary 141 (H) 70 - 99 mg/dL    Comment: Glucose reference range applies only to samples taken after fasting for at least 8 hours.   Comment 1 Notify RN    Comment 2 Document in Chart   Blood gas, arterial     Status: Abnormal   Collection Time: 12/27/19  3:55 AM  Result Value Ref Range   FIO2 30.00    Delivery systems VENTILATOR    Mode PRESSURE REGULATED VOLUME CONTROL    VT 440 mL   LHR 16 resp/min   Peep/cpap 5.0 cm H20   pH, Arterial 7.394 7.35 - 7.45   pCO2 arterial 28.4 (L) 32 - 48 mmHg   pO2, Arterial 128 (H) 83 - 108 mmHg   Bicarbonate 16.6 (L) 20.0 - 28.0 mmol/L   Acid-base deficit  6.6 (H) 0.0 - 2.0 mmol/L   O2 Saturation 98.6 %   Patient temperature 101.9    Collection site A-LINE    Drawn by [phone]    Sample type ARTERIAL     Comment: Performed at [facility], [place] [address], [place], [place] [address]  Glucose, capillary     Status: Abnormal   Collection Time: 12/27/19  4:01 AM  Result Value Ref Range   Glucose-Capillary 147 (H) 70 - 99 mg/dL    Comment: Glucose reference range applies only to samples taken after fasting for at least 8 hours.  Triglycerides     Status: None   Collection Time: 12/27/19  4:15 AM  Result Value Ref Range   Triglycerides 71 <150 mg/dL    Comment: Performed at [facility], [place] [address], [place], [place] [address]  Comprehensive metabolic panel     Status: Abnormal   Collection Time: 12/27/19  4:15 AM  Result Value Ref Range   Sodium 139 135 - 145 mmol/L   Potassium 3.3 (L) 3.5 - 5.1 mmol/L   Chloride 114 (H) 98 - 111 mmol/L   CO2 17 (L) 22 - 32 mmol/L   Glucose, Bld 159 (H) 70 - 99 mg/dL    Comment: Glucose reference range applies only to samples taken after fasting for at least 8 hours.   BUN 39 (H) 6 - 20 mg/dL   Creatinine, Ser 0.72 0.44 - 1.00 mg/dL   Calcium 7.8 (L) 8.9 - 10.3 mg/dL   Total Protein  5.2 (L) 6.5 - 8.1 g/dL   Albumin 1.9 (L) 3.5 - 5.0 g/dL   AST 32 15 - 41 U/L   ALT 21 0 - 44 U/L   Alkaline Phosphatase 250 (H) 38 - 126 U/L   Total Bilirubin 2.2 (H) 0.3 - 1.2 mg/dL   GFR calc non Af Amer >60 >60 mL/min   GFR calc Af Amer >60 >60 mL/min   Anion gap 8 5 - 15    Comment: Performed at [facility], [place] [address], [place], [place] [address]  CBC     Status: Abnormal   Collection Time: 12/27/19  4:15 AM  Result Value Ref Range   WBC 20.4 (H) 4.0 - 10.5 K/uL   RBC 2.41 (L) 3.87 - 5.11 MIL/uL   Hemoglobin 8.0 (L) 12.0 - 15.0 g/dL   HCT 23.7 (L) 36 - 46 %   MCV 98.3 80.0 - 100.0 fL   MCH 33.2 26.0 - 34.0 pg   MCHC 33.8 30.0 - 36.0 g/dL   RDW 16.6 (H) 11.5 - 15.5 %   Platelets 118 (L) 150 - 400 K/uL    Comment: Immature Platelet Fraction may be clinically indicated, consider ordering this additional test [id number] REPEATED TO VERIFY    nRBC 0.2 0.0 - 0.2 %    Comment: Performed at [facility], [place] [address], [place], [place] [address]  Glucose, capillary     Status: Abnormal   Collection Time: 12/27/19  7:59 AM  Result Value Ref Range   Glucose-Capillary 150 (H) 70 - 99 mg/dL    Comment: Glucose reference range applies only to samples taken after fasting for at least 8 hours.    Imaging / Studies: DG Chest Port 1 View  Result Date: 12/27/2019 CLINICAL DATA:  Acute respiratory failure. EXAM: PORTABLE CHEST 1 VIEW COMPARISON:  12/25/2019 FINDINGS: Grossly unchanged cardiac silhouette and mediastinal contours given persistently reduced lung volumes and patient  rotation. Stable position of support apparatus. No pneumothorax. Minimally improved aeration of lung bases with persistent bibasilar opacities, right greater than left. Mild pulmonary is congestion without frank evidence of edema. No pneumothorax. No evidence of edema. No acute osseous abnormalities. IMPRESSION: 1.  Stable positioning of support apparatus.  No pneumothorax.  2. Slightly improved aeration of lungs with persistent bibasilar opacities, right greater than left, atelectasis versus infiltrate. 3. Pulmonary venous congestion without frank evidence of edema. Electronically Signed   By: Sandi Mariscal M.D.   On: 12/27/2019 04:24   DG CHEST PORT 1 VIEW  Result Date: 12/25/2019 CLINICAL DATA:  Acute respiratory failure, hypoxemia, history of I and D of left buttock abscess EXAM: PORTABLE CHEST 1 VIEW COMPARISON:  12/25/2019 4:26 a.m. FINDINGS: The single frontal view of the chest demonstrates endotracheal tube, enteric catheter, and right internal jugular catheter unchanged. Cardiac silhouette is stable. There is persistent bibasilar consolidation without effusion or pneumothorax. IMPRESSION: 1. Stable support devices. 2. Persistent bibasilar consolidation, favor atelectasis.  The Electronically Signed   By: Randa Ngo M.D.   On: 12/25/2019 19:42    Medications / Allergies: per chart  Antibiotics: Anti-infectives (From admission, onward)   Start     Dose/Rate Route Frequency Ordered Stop   12/26/19 1948  vancomycin variable dose per unstable renal function (pharmacist dosing)     Discontinue      Does not apply See admin instructions 12/26/19 1948     12/23/19 2000  vancomycin (VANCOCIN) IVPB 1000 mg/200 mL premix  Status:  Discontinued        1,000 mg 200 mL/hr over 60 Minutes Intravenous Every 8 hours 12/23/19 1027 12/26/19 1944   12/23/19 1300  clindamycin (CLEOCIN) IVPB 900 mg     Discontinue     900 mg 100 mL/hr over 30 Minutes Intravenous Every 8 hours 12/23/19 1205     12/23/19 1200  metroNIDAZOLE (FLAGYL) IVPB 500 mg  Status:  Discontinued        500 mg 100 mL/hr over 60 Minutes Intravenous Every 8 hours 12/23/19 1135 12/23/19 1254   12/23/19 1045  vancomycin (VANCOREADY) IVPB 2000 mg/400 mL        2,000 mg 200 mL/hr over 120 Minutes Intravenous NOW 12/23/19 1018 12/23/19 1441   12/23/19 0300  cefTRIAXone (ROCEPHIN) 2 g in sodium chloride 0.9 %  100 mL IVPB     Discontinue     2 g 200 mL/hr over 30 Minutes Intravenous Every 24 hours 12/23/19 0236          Note: Portions of this report may have been transcribed using voice recognition software. Every effort was made to ensure accuracy; however, inadvertent computerized transcription errors may be present.   Any transcriptional errors that result from this process are unintentional.    Adin Hector, MD, FACS, MASCRS Gastrointestinal and Minimally Invasive Surgery  [facility] [address], [place], [place] [address] [phone] Fax [phone] Main/Paging  CONTACT INFORMATION: Weekday (9AM-5PM) concerns: Call [facility] main office at [phone] Weeknight (5PM-9AM) or Weekend/Holiday concerns: Check [email or web address] for General Surgery [facility] coverage (Please, do not use SecureChat as it is not reliable communication to operating surgeons for immediate patient care)      12/27/2019  8:07 AM

## 2019-12-27 NOTE — Progress Notes (Signed)
[place] Progress Note Patient Name: Alyssa Carey DOB: 04/29/1971 MRN: [id number]   Date of Service  12/27/2019  HPI/Events of Note  K+ 3.3  eICU Interventions  Elink electrolyte replacement protocol ordered.        [name] 12/27/2019, 5:44 AM

## 2019-12-27 NOTE — Progress Notes (Signed)
NAME:  Alyssa Carey, MRN:  [id number], DOB:  [birth year], LOS: 6 ADMISSION DATE:  12/21/2019, CONSULTATION DATE:  6/8 REFERRING MD:  Kshitiz, CHIEF COMPLAINT:  Confusion   Brief History   49 y/o female with a heavy alcohol history admitted in the setting of confusion and presumed alcohol withdrawal who later developed sepsis from a gas forming wound.    Past Medical History  Alcohol abuse  Significant Hospital Events   6/6 admission 6/8 ICU admission to OR ofr I&D, back intubated and on pressors, 6/9 started tube feeds. Still pressor dependent.kept on vent w/ anticipated return to OR planned  6/10: Back to the OR for significant debridement and also placement of diverting colostomy 6/11: Still sedated.  Hemodynamically still requiring low-dose phenylephrine.  Surgery felt wound looking good. Plan to initiate weaning. Sedation changed to precedex and PRN fent  6/12 more awake, on 15/5 pressure support Consults:  General surgery PCCM  Procedures:   oett 6/8>>> Right IJ CVL 6/8>>> Radial aline left 6/8>>  Significant Diagnostic Tests:  6/6 CT head > NAICP 6/7 U/S abdomen > hepatic steatosis 6/8 CT Pelvis > gas throughout medial subcutaneous soft tissues of left buttock into the left perineum  Micro Data:  6/6 SARS COV 2 > neg 6/6 blood culture >  6/8 urine > mult orgs  6/8 blood >  6/8: abd Proteus, but send for reincubation>>>  Antimicrobials:  6/7 ceftriaxone >  6/8 vanc >  6/8 flagyl > 6/9 6/8 clinda >   Interim history/subjective:   6/12 more awake, on 15/5 pressure support Started tube feeding overnight Fever  Objective   Blood pressure (!) 109/59, pulse 83, temperature 98.4 F (36.9 C), temperature source Oral, resp. rate (!) 25, height 5' 4"  (1.626 m), weight 105 kg, last menstrual period 03/23/2019, SpO2 100 %. CVP:  [10 mmHg-11 mmHg] 11 mmHg  Vent Mode: PRVC FiO2 (%):  [30 %] 30 % Set Rate:  [16 bmp] 16 bmp Vt Set:  [440 mL] 440 mL PEEP:  [5 cmH20] 5  cmH20 Plateau Pressure:  [20 cmH20-22 cmH20] 20 cmH20   Intake/Output Summary (Last 24 hours) at 12/27/2019 4142 Last data filed at 12/27/2019 0630 Gross per 24 hour  Intake 3090.3 ml  Output 1525 ml  Net 1565.3 ml   Filed Weights   12/25/19 0500 12/26/19 0500 12/27/19 0500  Weight: 94.4 kg 102 kg 105 kg    Examination:  General:  In bed on vent HENT: NCAT ETT in place PULM: CTA B, vent supported breathing CV: RRR, no mgr GI: ostomy in place, belly soft, BS+ MSK: normal bulk and tone Neuro: awake, nods head appropriately    Resolved Hospital Problem list     Assessment & Plan:   Severe sepsis/septic shock in setting of necrotizing fasciitis (present on admission): Now status post incision and debridement of skin, subcutaneous tissue, fascia and muscle of the left buttocks 6/8 She went back to the operating room once again on 6/10 For further debridement and placement of diverting colostomy  Proteus in wound, but high risk for polymicrobial infection and still has elevated WBC, fever, etc Plan Wean off neosynephrine for MAP . 68 Continue wound care per surgical team> planning pulse lavage Continue current antibiotics, reluctant to change them with fever, elevated WBC, persistent pressor requirement   Acute hypoxic respiratory failure.  Atelectasis Plan Pressure support as long as tolerated VAP prevention Daily WUA/SBT  Hypokalemia Non-gap acidosis improving Plan Replace K Continue LR Monitor BMET and UOP  Replace electrolytes as needed   Hepatic encephalopathy due to hepatic, alcoholic steatosis> worsening in setting of sepsis Plan Restart lactulose PAD protocol RASS goal 0 Precedex, prn fentanyl/versed  Hyperglycemia:  Plan SSI  Alcohol abuse: Plan Counsel to quit  Thrombocytopenia due to alcohol abuse: improving Plan Monitor for bleeding  Alcoholic Hepatitis: improving Plan LFT prn  Protein calorie malnutrition Plan Continue tube  feeding  Code status: full code  Best practice:  Nutrition: tube feeding Sedation: as above PUD: PPI Glycemic control:SSI DVT: SCDs  CODE STATUS: Full code Disposition continue in intensive care.     Labs   CBC: Recent Labs  Lab 12/23/19 0140 12/23/19 0140 12/23/19 1039 12/23/19 2016 12/23/19 2145 12/23/19 2145 12/24/19 0600 12/24/19 0600 12/25/19 0400 12/25/19 1618 12/25/19 1726 12/26/19 0500 12/27/19 0415  WBC 15.8*   < > 15.6*  --  16.9*  --  21.2*  --  24.6*  --   --  19.7* 20.4*  NEUTROABS 14.3*  --  13.6*  --   --   --  17.9*  --   --   --   --   --   --   HGB 13.1   < > 12.4   < > 10.8*   < > 10.3*   < > 9.3* 9.5* 8.5* 8.8* 8.0*  HCT 37.2   < > 35.5*   < > 30.4*   < > 29.9*   < > 26.8* 28.0* 25.0* 26.2* 23.7*  MCV 93.7   < > 93.9  --  94.1  --  93.7  --  94.0  --   --  97.0 98.3  PLT 31*   < > 32*  --  92*  --  97*  --  88*  --   --  89* 118*   < > = values in this interval not displayed.    Basic Metabolic Panel: Recent Labs  Lab 12/22/19 0136 12/23/19 0140 12/24/19 0600 12/24/19 1008 12/24/19 1706 12/25/19 0400 12/25/19 1618 12/25/19 1726 12/25/19 1847 12/25/19 2033 12/26/19 0500 12/26/19 1757 12/27/19 0415  NA 133*   < >   < >  --   --  138   < > 145  --  140 138 140 139  K 3.5   < >   < >  --   --  2.9*   < > 3.8  --  3.9 3.8 3.3* 3.3*  CL 102   < >   < >  --   --  114*  --   --   --  115* 111 115* 114*  CO2 16*   < >   < >  --   --  15*  --   --   --  15* 16* 18* 17*  GLUCOSE 126*   < >   < >  --   --  125*  --   --   --  116* 125* 162* 159*  BUN 39*   < >   < >  --   --  28*  --   --   --  29* 29* 32* 39*  CREATININE 1.32*   < >   < >  --   --  0.69  --   --   --  0.65 0.63 0.67 0.72  CALCIUM 7.9*   < >   < >  --   --  7.4*  --   --   --  7.8*  7.6* 7.5* 7.8*  MG 2.3   < >  --  1.7 2.2 1.9  --   --  1.8  --  1.7  --   --   PHOS 3.6  --   --  3.9 3.0 2.6  --   --  4.2  --   --   --   --    < > = values in this interval not displayed.    GFR: Estimated Creatinine Clearance: 103.8 mL/min (by C-G formula based on SCr of 0.72 mg/dL). Recent Labs  Lab 12/23/19 0140 12/23/19 0140 12/23/19 1039 12/23/19 2145 12/24/19 0600 12/24/19 1008 12/24/19 1215 12/25/19 0400 12/26/19 0500 12/27/19 0415  WBC 15.8*   < > 15.6*   < > 21.2*  --   --  24.6* 19.7* 20.4*  LATICACIDVEN 3.3*  --  2.3*  --   --  2.3* 2.1*  --   --   --    < > = values in this interval not displayed.    Liver Function Tests: Recent Labs  Lab 12/23/19 1039 12/23/19 2145 12/24/19 0600 12/25/19 0400 12/27/19 0415  AST 123* 81* 64* 42* 32  ALT 62* 51* 46* 34 21  ALKPHOS 318* 268* 264* 266* 250*  BILITOT 2.8* 2.3* 2.1* 1.9* 2.2*  PROT 5.1* 4.6* 4.6* 4.5* 5.2*  ALBUMIN 1.5* 1.4* 1.4* 1.2* 1.9*   No results for input(s): LIPASE, AMYLASE in the last 168 hours. Recent Labs  Lab 12/21/19 1658 12/22/19 0136 12/23/19 1039 12/24/19 0630  AMMONIA 76* 77* 76* 64*    ABG    Component Value Date/Time   PHART 7.394 12/27/2019 0355   PCO2ART 28.4 (L) 12/27/2019 0355   PO2ART 128 (H) 12/27/2019 0355   HCO3 16.6 (L) 12/27/2019 0355   TCO2 17 (L) 12/25/2019 1726   ACIDBASEDEF 6.6 (H) 12/27/2019 0355   O2SAT 98.6 12/27/2019 0355     Coagulation Profile: Recent Labs  Lab 12/22/19 0136 12/23/19 1221 12/23/19 2145  INR 1.3* 1.3* 1.4*    Cardiac Enzymes: No results for input(s): CKTOTAL, CKMB, CKMBINDEX, TROPONINI in the last 168 hours.  HbA1C: Hgb A1c MFr Bld  Date/Time Value Ref Range Status  12/24/2019 10:08 AM 5.7 (H) 4.8 - 5.6 % Final    Comment:    (NOTE) Pre diabetes:          5.7%-6.4% Diabetes:              >6.4% Glycemic control for   <7.0% adults with diabetes     CBG: Recent Labs  Lab 12/26/19 1616 12/26/19 2014 12/27/19 0003 12/27/19 0401 12/27/19 0759  GLUCAP 151* 122* 141* 147* 150*     Critical care time: 35 minutes   Roselie Awkward, MD [place] [facility] Pager: [phone] Cell: [phone] If no response,  call [phone]

## 2019-12-27 NOTE — Anesthesia Postprocedure Evaluation (Signed)
Anesthesia Post Note  Patient: Alyssa Carey  Procedure(s) Performed: IRRIGATION AND DEBRIDEMENT BUTTOCKS, LAP LOOP COLOSTOMY (N/A ) LAPAROSCOPIC LOOP COLOSTOMY (N/A )     Patient location during evaluation: ICU Anesthesia Type: General Level of consciousness: sedated and patient remains intubated per anesthesia plan Pain management: pain level controlled Vital Signs Assessment: post-procedure vital signs reviewed and stable Respiratory status: patient on ventilator - see flowsheet for VS and patient remains intubated per anesthesia plan (Dr. Lake Bells planning on weaning pt from vent today) Cardiovascular status: stable Postop Assessment: no apparent nausea or vomiting Anesthetic complications: no Comments: Pt hemodynamics improved s/p surgery Thursday, to begin weaning from vent   No complications documented.  Last Vitals:  Vitals:   12/27/19 1830 12/27/19 1845  BP:    Pulse: 90 90  Resp: (!) 27 (!) 29  Temp:    SpO2: 100% 100%    Last Pain:  Vitals:   12/27/19 1756  TempSrc: Oral  PainSc:                  Arlette Schaad,E. Cane Dubray

## 2019-12-27 NOTE — Progress Notes (Signed)
Patient returned to full vent support at this time due to increased RR of appox 40 and increased HR; HR initially in the 80's, but now 110's. Bedside RN and charge aware of vent mode change. RT will continue to monitor patient.

## 2019-12-27 NOTE — Progress Notes (Signed)
Pharmacy Antibiotic Note  Alyssa Carey is a 49 y.o. female admitted on 12/21/2019 with wound infection.  Pharmacy has been consulted for vancomycin dosing.  Pt is 40yoF with PMH significant for EtOH abuse. Pt admitted with multiple stage II wounds on left buttock.  Today, 12/27/19  -WBC remains elevated ~20 for past 5 days -CrCl >155m/min, Scr unchanged.  Good UOP. -Still spiking fevers- Tm 102.59F -Cx data pending: Polymicrobial wound per gram stain; + proteus  Plan:  Day 5 abx  Random Vanc level= 39 this morning (Goal VT 15-20 mcg/mL)  Recheck random level in 12h.  Resume Vanc once level <20.  Ceftriaxone 2 g IV daily per MD  Clindamycin 9026mIV q8h per MD  Follow renal function, culture data.   Height: 5' 4"  (162.6 cm) Weight: 105 kg (231 lb 7.7 oz) IBW/kg (Calculated) : 54.7  Temp (24hrs), Avg:99.8 F (37.7 C), Min:97.3 F (36.3 C), Max:102.1 F (38.9 C)  Recent Labs  Lab 12/21/19 2231 12/22/19 0136 12/23/19 0140 12/23/19 0140 12/23/19 1039 12/23/19 1039 12/23/19 2145 12/23/19 2145 12/24/19 0600 12/24/19 0600 12/24/19 1008 12/24/19 1215 12/25/19 0400 12/25/19 2033 12/26/19 0500 12/26/19 1545 12/26/19 1757 12/27/19 0415 12/27/19 0824  WBC  --    < > 15.8*   < > 15.6*   < > 16.9*  --  21.2*  --   --   --  24.6*  --  19.7*  --   --  20.4*  --   CREATININE  --    < > 0.84   < > 0.75   < > 0.59   < > 0.68   < >  --   --  0.69 0.65 0.63  --  0.67 0.72  --   LATICACIDVEN 2.9*  --  3.3*  --  2.3*  --   --   --   --   --  2.3* 2.1*  --   --   --   --   --   --   --   VANCOTROUGH  --   --   --   --   --   --   --   --   --   --   --   --   --   --   --  571 --   --   --   VANCORANDOM  --   --   --   --   --   --   --   --   --   --   --   --   --   --   --   --   --   --  39   < > = values in this interval not displayed.    Estimated Creatinine Clearance: 103.8 mL/min (by C-G formula based on SCr of 0.72 mg/dL).    Allergies  Allergen Reactions  .  Penicillins     Did it involve swelling of the face/tongue/throat, SOB, or low BP? N Did it involve sudden or severe rash/hives, skin peeling, or any reaction on the inside of your mouth or nose? Y Did you need to seek medical attention at a hospital or doctor's office? N When did it last happen?Childhood If all above answers are "NO", may proceed with cephalosporin use.     Antimicrobials this admission: ceftriaxone 6/8 >>  vancomycin 6/8 >>  Clinda 6/8>>  Dose adjustments this admission: 6/11 VT = 57 (drawn appropriately); hold vanc (rec'd ~  1/2 dose before level resulted), recheck VRm Sunday AM 6/13 6/12 RVL @0824  = 39.  Continue to hold Vanc 6/12 RVL @ 8p Microbiology results: 6/8 BCx2: ngtd 6/8 UCx: mult sp F 6/6 covid neg 6/7 Hep A B C NR 6/7 HIV NR 6/8 MRSA neg 6/8 L buttocks surg cx: gram st: GPC, GNR, GPR Pan sensitive proteus mirabilis  Thank you for allowing pharmacy to be a part of this patient's care.  Netta Cedars, PharmD 12/27/2019 11:43 AM

## 2019-12-28 ENCOUNTER — Inpatient Hospital Stay (HOSPITAL_COMMUNITY): Payer: Medicaid Other

## 2019-12-28 DIAGNOSIS — M726 Necrotizing fasciitis: Secondary | ICD-10-CM

## 2019-12-28 DIAGNOSIS — Z933 Colostomy status: Secondary | ICD-10-CM

## 2019-12-28 LAB — GLUCOSE, CAPILLARY
Glucose-Capillary: 124 mg/dL — ABNORMAL HIGH (ref 70–99)
Glucose-Capillary: 131 mg/dL — ABNORMAL HIGH (ref 70–99)
Glucose-Capillary: 147 mg/dL — ABNORMAL HIGH (ref 70–99)
Glucose-Capillary: 153 mg/dL — ABNORMAL HIGH (ref 70–99)
Glucose-Capillary: 155 mg/dL — ABNORMAL HIGH (ref 70–99)
Glucose-Capillary: 157 mg/dL — ABNORMAL HIGH (ref 70–99)
Glucose-Capillary: 170 mg/dL — ABNORMAL HIGH (ref 70–99)

## 2019-12-28 LAB — URINALYSIS, ROUTINE W REFLEX MICROSCOPIC
Bilirubin Urine: NEGATIVE
Glucose, UA: NEGATIVE mg/dL
Hgb urine dipstick: NEGATIVE
Ketones, ur: NEGATIVE mg/dL
Leukocytes,Ua: NEGATIVE
Nitrite: NEGATIVE
Protein, ur: NEGATIVE mg/dL
Specific Gravity, Urine: 1.021 (ref 1.005–1.030)
pH: 6 (ref 5.0–8.0)

## 2019-12-28 LAB — COMPREHENSIVE METABOLIC PANEL
ALT: 20 U/L (ref 0–44)
AST: 45 U/L — ABNORMAL HIGH (ref 15–41)
Albumin: 1.5 g/dL — ABNORMAL LOW (ref 3.5–5.0)
Alkaline Phosphatase: 262 U/L — ABNORMAL HIGH (ref 38–126)
Anion gap: 8 (ref 5–15)
BUN: 46 mg/dL — ABNORMAL HIGH (ref 6–20)
CO2: 16 mmol/L — ABNORMAL LOW (ref 22–32)
Calcium: 7.6 mg/dL — ABNORMAL LOW (ref 8.9–10.3)
Chloride: 118 mmol/L — ABNORMAL HIGH (ref 98–111)
Creatinine, Ser: 0.55 mg/dL (ref 0.44–1.00)
GFR calc Af Amer: >60 mL/min (ref >60)
GFR calc non Af Amer: >60 mL/min (ref >60)
Glucose, Bld: 168 mg/dL — ABNORMAL HIGH (ref 70–99)
Potassium: 3.9 mmol/L (ref 3.5–5.1)
Sodium: 142 mmol/L (ref 135–145)
Total Bilirubin: 1.4 mg/dL — ABNORMAL HIGH (ref 0.3–1.2)
Total Protein: 5.3 g/dL — ABNORMAL LOW (ref 6.5–8.1)

## 2019-12-28 LAB — CBC WITH DIFFERENTIAL/PLATELET
Abs Immature Granulocytes: 2.25 10*3/uL — ABNORMAL HIGH (ref 0.00–0.07)
Basophils Absolute: 0.1 10*3/uL (ref 0.0–0.1)
Basophils Relative: 1 %
Eosinophils Absolute: 0.2 10*3/uL (ref 0.0–0.5)
Eosinophils Relative: 1 %
HCT: 23.4 % — ABNORMAL LOW (ref 36.0–46.0)
Hemoglobin: 7.6 g/dL — ABNORMAL LOW (ref 12.0–15.0)
Immature Granulocytes: 11 %
Lymphocytes Relative: 16 %
Lymphs Abs: 3.4 10*3/uL (ref 0.7–4.0)
MCH: 32.2 pg (ref 26.0–34.0)
MCHC: 32.5 g/dL (ref 30.0–36.0)
MCV: 99.2 fL (ref 80.0–100.0)
Monocytes Absolute: 0.8 10*3/uL (ref 0.1–1.0)
Monocytes Relative: 4 %
Neutro Abs: 14.5 10*3/uL — ABNORMAL HIGH (ref 1.7–7.7)
Neutrophils Relative %: 67 %
Platelets: 140 10*3/uL — ABNORMAL LOW (ref 150–400)
RBC: 2.36 MIL/uL — ABNORMAL LOW (ref 3.87–5.11)
RDW: 17.2 % — ABNORMAL HIGH (ref 11.5–15.5)
WBC: 21.2 10*3/uL — ABNORMAL HIGH (ref 4.0–10.5)
nRBC: 0.2 % (ref 0.0–0.2)

## 2019-12-28 LAB — CULTURE, BLOOD (ROUTINE X 2)
Culture: NO GROWTH
Culture: NO GROWTH
Special Requests: ADEQUATE
Special Requests: ADEQUATE

## 2019-12-28 LAB — AEROBIC/ANAEROBIC CULTURE W GRAM STAIN (SURGICAL/DEEP WOUND): Gram Stain: NONE SEEN

## 2019-12-28 LAB — VANCOMYCIN, RANDOM: Vancomycin Rm: 28

## 2019-12-28 MED ORDER — ACETAMINOPHEN 160 MG/5ML PO SOLN
650.0000 mg | Freq: Four times a day (QID) | ORAL | Status: DC | PRN
  Administered 2019-12-28 (×2): 650 mg
  Filled 2019-12-28 (×3): qty 20.3

## 2019-12-28 MED ORDER — LINEZOLID 600 MG/300ML IV SOLN
600.0000 mg | Freq: Two times a day (BID) | INTRAVENOUS | Status: AC
  Administered 2019-12-28 – 2020-01-06 (×19): 600 mg via INTRAVENOUS
  Filled 2019-12-28 (×20): qty 300

## 2019-12-28 MED ORDER — DEXMEDETOMIDINE HCL IN NACL 400 MCG/100ML IV SOLN
0.0000 ug/kg/h | INTRAVENOUS | Status: DC
  Administered 2019-12-28 (×3): 1 ug/kg/h via INTRAVENOUS
  Administered 2019-12-29 – 2019-12-30 (×10): 1.2 ug/kg/h via INTRAVENOUS
  Administered 2019-12-30: 1 ug/kg/h via INTRAVENOUS
  Administered 2019-12-30 – 2019-12-31 (×4): 1.2 ug/kg/h via INTRAVENOUS
  Filled 2019-12-28 (×11): qty 100
  Filled 2019-12-28: qty 200
  Filled 2019-12-28 (×3): qty 100
  Filled 2019-12-28: qty 200
  Filled 2019-12-28: qty 100
  Filled 2019-12-28: qty 200
  Filled 2019-12-28: qty 100

## 2019-12-28 MED ORDER — VITAL HIGH PROTEIN PO LIQD
1000.0000 mL | ORAL | Status: DC
  Administered 2019-12-29: 1000 mL

## 2019-12-28 MED ORDER — DAKINS (1/4 STRENGTH) 0.125 % EX SOLN
Freq: Three times a day (TID) | CUTANEOUS | Status: AC
  Filled 2019-12-28 (×2): qty 473

## 2019-12-28 MED ORDER — VANCOMYCIN HCL IN DEXTROSE 1-5 GM/200ML-% IV SOLN: 1000.0000 mg | INTRAVENOUS | Status: DC

## 2019-12-28 NOTE — Progress Notes (Signed)
Pt's son was bedside when I arrived. Pt is intubated but he said she was nodding her head. When asked if there anything he needed or that I could do for him he asked for prayer. He stood next to his mom and laid hands on her during prayer. Pt moved her lips following prayer as if to say something. Pt's son seems to be encouraged by any sort of movement. He explained that she had an infection on her backside and had to have surgery and they are trying to get that under control. Pt was attentive to his mom and hopeful. I provided listening and pastoral presence. Please page if additional support is needed. [place], [place]   12/28/19 1300  Clinical Encounter Type  Visited With Family

## 2019-12-28 NOTE — Progress Notes (Addendum)
NAME:  TECIA CINNAMON, MRN:  [id number], DOB:  03-Jun-1971, LOS: 7 ADMISSION DATE:  12/21/2019, CONSULTATION DATE:  6/8 REFERRING MD:  Kshitiz, CHIEF COMPLAINT:  Confusion   Brief History   49 y/o female with a heavy alcohol history admitted in the setting of confusion and presumed alcohol withdrawal who later developed sepsis from a gas forming wound.    Past Medical History  Alcohol abuse  Significant Hospital Events   6/6 admission 6/8 ICU admission to OR ofr I&D, back intubated and on pressors, 6/9 started tube feeds. Still pressor dependent.kept on vent w/ anticipated return to OR planned  6/10: Back to the OR for significant debridement and also placement of diverting colostomy 6/11: Still sedated.  Hemodynamically still requiring low-dose phenylephrine.  Surgery felt wound looking good. Plan to initiate weaning. Sedation changed to precedex and PRN fent  6/12 more awake, on 15/5 pressure support  Consults:  General surgery PCCM  Procedures:   oett 6/8>>> Right IJ CVL 6/8>>> Radial aline left 6/8>>  Significant Diagnostic Tests:  6/6 CT head > NAICP 6/7 U/S abdomen > hepatic steatosis 6/8 CT Pelvis > gas throughout medial subcutaneous soft tissues of left buttock into the left perineum  Micro Data:  6/6 SARS COV 2 > neg 6/6 blood culture >  6/8 urine > mult orgs  6/8 blood >  6/8: abd Proteus, but send for reincubation>>>  6/13 blood >   Antimicrobials:  6/7 ceftriaxone >  6/8 vanc >  6/8 flagyl > 6/9 6/8 clinda >   Interim history/subjective:   Fever overnight Some tachypnea No respiratory secretions Weaned on vent 5-6 hours yesterday Vasopressor requirement down  Objective   Blood pressure (!) 110/59, pulse 78, temperature 99.5 F (37.5 C), temperature source Axillary, resp. rate (!) 26, height 5' 4"  (1.626 m), weight 105.6 kg, last menstrual period 03/23/2019, SpO2 100 %.    Vent Mode: PRVC FiO2 (%):  [30 %] 30 % Set Rate:  [16 bmp] 16 bmp Vt  Set:  [440 mL] 440 mL PEEP:  [5 cmH20] 5 cmH20 Plateau Pressure:  [22 cmH20-24 cmH20] 23 cmH20   Intake/Output Summary (Last 24 hours) at 12/28/2019 0741 Last data filed at 12/28/2019 0600 Gross per 24 hour  Intake 4526.95 ml  Output 1775 ml  Net 2751.95 ml   Filed Weights   12/26/19 0500 12/27/19 0500 12/28/19 0500  Weight: 102 kg 105 kg 105.6 kg    Examination:  General:  In bed on vent HENT: NCAT ETT in place PULM: CTA B, vent supported breathing CV: RRR, no mgr GI: BS+, soft, nontender, ostomy MSK: normal bulk and tone Neuro: drowsy on vent, nods head appropriately        Resolved Hospital Problem list     Assessment & Plan:   Severe sepsis/septic shock in setting of necrotizing fasciitis (present on admission): has been to OR twice for debridement Fever with persistently elevated WBC 6/12-6/13 : suspect related to wound as CXR clear, no pulmonary secretions, etc Plan Check blood cultures Check urinalysis Continue current antibiotics Discuss with surgery: role for sharp debridement again? Wean off neosynephrine for MAP > 65  Acute hypoxic respiratory failure> improving but not strong enough for extubation yet (tachypnea on 5/5 PSV, weans on 15/5) Atelectasis Plan Pressure support as long as tolerated VAP prevention Daily WUA/SBT  Hypokalemia Non-gap acidosis improving Plan Continue LR Replace K Monitor BMET and UOP Replace electrolytes as needed  Hepatic encephalopathy due to hepatic, alcoholic steatosis> worsening in  setting of sepsis Plan Continue lactulose PAD protocol, RASS goal 0 Wean off precedex  Hyperglycemia:  Plan SSI  Alcohol abuse: Plan Counsel to quit  Thrombocytopenia due to alcohol abuse: improving Plan Monitor for bleeding  Alcoholic Hepatitis: improving Plan LFT prn  Protein calorie malnutrition Plan Continue tube feeding  Code status: full code  Best practice:  Nutrition: tube feeding Sedation: as  above PUD: PPI Glycemic control:SSI DVT: SCDs  CODE STATUS: Full code Disposition continue in intensive care.     Labs   CBC: Recent Labs  Lab 12/23/19 0140 12/23/19 0140 12/23/19 1039 12/23/19 2016 12/24/19 0600 12/24/19 0600 12/25/19 0400 12/25/19 0400 12/25/19 1618 12/25/19 1726 12/26/19 0500 12/27/19 0415 12/28/19 0435  WBC 15.8*   < > 15.6*   < > 21.2*  --  24.6*  --   --   --  19.7* 20.4* 21.2*  NEUTROABS 14.3*  --  13.6*  --  17.9*  --   --   --   --   --   --   --  14.5*  HGB 13.1   < > 12.4   < > 10.3*   < > 9.3*   < > 9.5* 8.5* 8.8* 8.0* 7.6*  HCT 37.2   < > 35.5*   < > 29.9*   < > 26.8*   < > 28.0* 25.0* 26.2* 23.7* 23.4*  MCV 93.7   < > 93.9   < > 93.7  --  94.0  --   --   --  97.0 98.3 99.2  PLT 31*   < > 32*   < > 97*  --  88*  --   --   --  89* 118* 140*   < > = values in this interval not displayed.    Basic Metabolic Panel: Recent Labs  Lab 12/22/19 0136 12/23/19 0140 12/24/19 0600 12/24/19 1008 12/24/19 1706 12/25/19 0400 12/25/19 0400 12/25/19 1618 12/25/19 1847 12/25/19 2033 12/26/19 0500 12/26/19 1757 12/27/19 0415 12/28/19 0435  NA 133*   < >   < >  --   --  138   < >   < >  --  140 138 140 139 142  K 3.5   < >   < >  --   --  2.9*   < >   < >  --  3.9 3.8 3.3* 3.3* 3.9  CL 102   < >   < >  --   --  114*   < >  --   --  115* 111 115* 114* 118*  CO2 16*   < >   < >  --   --  15*   < >  --   --  15* 16* 18* 17* 16*  GLUCOSE 126*   < >   < >  --   --  125*   < >  --   --  116* 125* 162* 159* 168*  BUN 39*   < >   < >  --   --  28*   < >  --   --  29* 29* 32* 39* 46*  CREATININE 1.32*   < >   < >  --   --  0.69   < >  --   --  0.65 0.63 0.67 0.72 0.55  CALCIUM 7.9*   < >   < >  --   --  7.4*   < >  --   --  7.8* 7.6* 7.5* 7.8* 7.6*  MG 2.3   < >  --  1.7 2.2 1.9  --   --  1.8  --  1.7  --   --   --   PHOS 3.6  --   --  3.9 3.0 2.6  --   --  4.2  --   --   --   --   --    < > = values in this interval not displayed.   GFR: Estimated  Creatinine Clearance: 104.2 mL/min (by C-G formula based on SCr of 0.55 mg/dL). Recent Labs  Lab 12/23/19 0140 12/23/19 0140 12/23/19 1039 12/23/19 2145 12/24/19 0600 12/24/19 1008 12/24/19 1215 12/25/19 0400 12/26/19 0500 12/27/19 0415 12/28/19 0435  WBC 15.8*   < > 15.6*   < >   < >  --   --  24.6* 19.7* 20.4* 21.2*  LATICACIDVEN 3.3*  --  2.3*  --   --  2.3* 2.1*  --   --   --   --    < > = values in this interval not displayed.    Liver Function Tests: Recent Labs  Lab 12/23/19 2145 12/24/19 0600 12/25/19 0400 12/27/19 0415 12/28/19 0435  AST 81* 64* 42* 32 45*  ALT 51* 46* 34 21 20  ALKPHOS 268* 264* 266* 250* 262*  BILITOT 2.3* 2.1* 1.9* 2.2* 1.4*  PROT 4.6* 4.6* 4.5* 5.2* 5.3*  ALBUMIN 1.4* 1.4* 1.2* 1.9* 1.5*   No results for input(s): LIPASE, AMYLASE in the last 168 hours. Recent Labs  Lab 12/21/19 1658 12/22/19 0136 12/23/19 1039 12/24/19 0630  AMMONIA 76* 77* 76* 64*    ABG    Component Value Date/Time   PHART 7.394 12/27/2019 0355   PCO2ART 28.4 (L) 12/27/2019 0355   PO2ART 128 (H) 12/27/2019 0355   HCO3 16.6 (L) 12/27/2019 0355   TCO2 17 (L) 12/25/2019 1726   ACIDBASEDEF 6.6 (H) 12/27/2019 0355   O2SAT 98.6 12/27/2019 0355     Coagulation Profile: Recent Labs  Lab 12/22/19 0136 12/23/19 1221 12/23/19 2145  INR 1.3* 1.3* 1.4*    Cardiac Enzymes: No results for input(s): CKTOTAL, CKMB, CKMBINDEX, TROPONINI in the last 168 hours.  HbA1C: Hgb A1c MFr Bld  Date/Time Value Ref Range Status  12/24/2019 10:08 AM 5.7 (H) 4.8 - 5.6 % Final    Comment:    (NOTE) Pre diabetes:          5.7%-6.4% Diabetes:              >6.4% Glycemic control for   <7.0% adults with diabetes     CBG: Recent Labs  Lab 12/27/19 1155 12/27/19 1653 12/27/19 1944 12/28/19 0019 12/28/19 0418  GLUCAP 159* 139* 137* 147* 131*     Critical care time: 32 minutes   Roselie Awkward, MD [place] [facility] Pager: [phone] Cell: [phone] If no response,  call [phone]

## 2019-12-28 NOTE — Progress Notes (Signed)
Pharmacy Antibiotic Note  Alyssa Carey is a 49 y.o. female admitted on 12/21/2019 with wound infection.  Pharmacy has been consulted for vancomycin dosing.  Pt is 37yoF with PMH significant for EtOH abuse. Pt admitted with multiple stage II wounds on left buttock.  Today, 12/28/19  -WBC remains elevated ~20 for past 6 days -CrCl >184m/min, Scr unchanged.  +UOP/foley. -Still spiking fevers- Tm 102.19F -Cx data pending: Polymicrobial wound per gram stain; + proteus, ecoli, enterococcus - Vanc levels 6/12 @ 0824= 39; @2021 = 28: Vanc half-life=25hrs.    Plan:  Day 6 abx  Resume Vanc 1gm IV q24h this afternoon when calculated Vanc level will be <20.  Target Vanc trough 15-20.    Ceftriaxone 2 g IV daily per MD  Clindamycin 9038mIV q8h per MD  Follow renal function, culture data.   Height: 5' 4"  (162.6 cm) Weight: 105.6 kg (232 lb 12.9 oz) IBW/kg (Calculated) : 54.7  Temp (24hrs), Avg:100.7 F (38.2 C), Min:98 F (36.7 C), Max:102.5 F (39.2 C)  Recent Labs  Lab 12/21/19 2231 12/22/19 0136 12/23/19 0140 12/23/19 0140 12/23/19 1039 12/23/19 2145 12/24/19 0600 12/24/19 0600 12/24/19 1008 12/24/19 1215 12/25/19 0400 12/25/19 0400 12/25/19 2033 12/26/19 0500 12/26/19 1545 12/26/19 1757 12/27/19 0415 12/27/19 0824 12/27/19 2021 12/28/19 0435  WBC  --    < > 15.8*   < > 15.6*   < > 21.2*  --   --   --  24.6*  --   --  19.7*  --   --  20.4*  --   --  21.2*  CREATININE  --    < > 0.84   < > 0.75   < > 0.68   < >  --   --  0.69   < > 0.65 0.63  --  0.67 0.72  --   --  0.55  LATICACIDVEN 2.9*  --  3.3*  --  2.3*  --   --   --  2.3* 2.1*  --   --   --   --   --   --   --   --   --   --   VANCOTROUGH  --   --   --   --   --   --   --   --   --   --   --   --   --   --  5737 --   --   --   --   --   VANCORANDOM  --   --   --   --   --   --   --   --   --   --   --   --   --   --   --   --   --  39 28  --    < > = values in this interval not displayed.    Estimated  Creatinine Clearance: 104.2 mL/min (by C-G formula based on SCr of 0.55 mg/dL).    Allergies  Allergen Reactions  . Penicillins     Did it involve swelling of the face/tongue/throat, SOB, or low BP? N Did it involve sudden or severe rash/hives, skin peeling, or any reaction on the inside of your mouth or nose? Y Did you need to seek medical attention at a hospital or doctor's office? N When did it last happen?Childhood If all above answers are "NO", may proceed with cephalosporin use.  Antimicrobials this admission: ceftriaxone 6/8 >>  vancomycin 6/8 >>  Clinda 6/8>>  Dose adjustments this admission: 6/11 VT = 57 (drawn appropriately); hold vanc (rec'd ~1/2 dose before level resulted), recheck VRm Sunday AM 6/13 6/12 RVL @0824  = 39.  Continue to hold Vanc 6/12 RVL @ 8p Microbiology results: 6/8 BCx2: ngtd 6/8 UCx: mult sp F 6/6 covid neg 6/7 Hep A B C NR 6/7 HIV NR 6/8 MRSA neg 6/8 L buttocks surg cx: gram st: GPC, GNR, GPR Pan sensitive proteus mirabilis, Ecoli (R=amp, I=Unasyn, S=cephalosporins) Enterococcus gallinarium (sens pending)  Thank you for allowing pharmacy to be a part of this patient's care.  Netta Cedars, PharmD 12/28/2019 10:27 AM

## 2019-12-28 NOTE — Progress Notes (Signed)
[facility] Progress Note Patient Name: Alyssa Carey DOB: 10-08-1970 MRN: [id number]   Date of Service  12/28/2019  HPI/Events of Note  Fever to 102.30F  eICU Interventions  Ordered tylenol per NG tube PRN fever.     Intervention Category Intermediate Interventions: Other:  Charlott Rakes 12/28/2019, 4:28 AM

## 2019-12-28 NOTE — Progress Notes (Signed)
[facility]  Son updated bedside.  I advised that this will take weeks to months to recover and overall chances of survival are poor considering the severity of her wound and comorbid conditions. He voiced understanding. Code status changed to DNR, but continue full medical support including pressors, mechanical ventilation as we are doing.   Roselie Awkward, MD [facility] Pager: [phone] Cell: [phone] If no response, call [phone]

## 2019-12-28 NOTE — Progress Notes (Signed)
Alyssa Carey [id number] [birth year]  CARE TEAM:  PCP: Patient, No Pcp Per  Outpatient Care Team: Patient Care Team: Patient, No Pcp Per as PCP - General (General Practice)  Inpatient Treatment Team: Treatment Team: Attending Provider: Juanito Doom, MD; Social Worker: Merri Brunette; Registered Nurse: Joselyn Glassman, RN; Technician: Berenice Bouton, NT; Rounding Team: Ccs, Md, MD; Rounding Team: Pccm, Md, MD; [place] Nurse: Tenna Child, RN   Problem List:   Principal Problem:   Hepatic encephalopathy [facility]) Active Problems:   Fall at home, initial encounter   Weakness   Alcoholism ([place])   Thrombocytopenia ([place])   ARF (acute renal failure) ([place])   Alcoholic liver disease ([place])   Pressure injury of skin   Elevated LFTs   Acute respiratory failure ([place])   12/23/2019 Surgeon: Vikki Ports A ConnorMD  Procedure performed: incision and debridement of skin, subcutaneous tissue, fascia and muscle 15x15x6cm, left buttock  Preop diagnosis: necrotizing fasciitis  Post-op diagnosis/intraop findings: same  12/25/2019  PRE-OPERATIVE DIAGNOSIS: Necrotizing fasciitis of left buttock, need for fecal diversion  POST-OPERATIVE DIAGNOSIS: Same  PROCEDURE:   Debridement of skin, subcutaneous tissue, fascia and muscle left buttock, pulsatile lavage LAPAROSCOPIC LOOP DESCENDING COLOSTOMY  SURGEON: Greer Pickerel, MD  Assessment Fournier's gangrene/necrotizing fasciitis status post operative debridement x2 with fecal diversion.  Multisystem organ failure  CRITICAL  [facility] Stay = 7 days)  Plan:  At this point, I think surgery is done what they can.  She is diverted.  Her wounds are broadly open.  I cannot prove any rectal wall perforation nor any gangrene of the rectum and itself.  Further operative debridement risks breach into the peritoneal cavity with small bowel evisceration.  Discussed with Dr. [place] Bells with critical care as well as the ICU team that were at  the bedside for dressing change and wound check about more aggressive packing.  Switch packing from normal saline today TO Dakin solution quarter strength for 48 hours and the help for better antimicrobial control.  Then switch back to saline.  Do packing 3 times a day for the next 48 hours as well.  Continue IV antibiotics.  Discussed with Dr. [place] Bells about possible antibiotic rotation versus continuing current regimen.  ongoing management of respiratory failure and septic shock per CCM, appreciate their care  PT to see for hydrotherapy to help clean up necrotic skin and subcutaneous tissues gradually.   Consider backing tube feed down to trophic rate only until off pressors.  While she did act sicker, she is weaning off her pressors this morning.  Awaiting for return of bowel function with significant ileus as anticipated.  If ileus does not resolve in a few days, may need IV TNA nutrition.  Try to hold off since she is volume overloaded.  Continue colostomy care  TF.  Red rubber catheter under stoma to be removed 4-5 days postop.  FEN: NPO, IVF, ok for TF  ID: Rocephin, Clindamycin, Vancomycin  VTE: SCD's  Foley: placed 6/8.  Needs to stay indefinitely for urine diversion given massive contaminated wound tracking up to pelvis needs complete urinary and fecal diversion for wound have a chance to get a control and possibly heal  12/28/2019    Subjective: (Chief complaint)  Intubated sedated on vent.  Increased need for pressors at night.  Weaning a little bit.  Dressing change done at 2 AM.  Hydrotherapy done yesterday.  Critical care following.  Objective:  Vital signs:  Vitals:   12/28/19 0400  12/28/19 0500 12/28/19 0600 12/28/19 0618  BP:      Pulse: 91 84 73 78  Resp: (!) 25 (!) 27 (!) 26 (!) 26  Temp: (!) 102.5 F (39.2 C)   99.5 F (37.5 C)  TempSrc: Bladder   Axillary  SpO2: 100% 100% 100% 100%  Weight:  105.6 kg    Height:        Last BM Date:  12/26/19  Intake/Output   Yesterday:  06/12 0701 - 06/13 0700 In: 4527 [I.V.:2590.9; NG/GT:1400; IV Piggyback:536] Out: 3790 [Urine:1425; Stool:350] This shift:  No intake/output data recorded.  Bowel function:  Flatus: No  BM:  No  Drain: Colostomy in place with serous drainage   Physical Exam:  General: Pt intubated sedated on vent. Eyes: Sclera clear.  No icterus Neuro: Sedated Lymph: No head/neck/groin lymphadenopathy Chest: Coarse breath sounds bilaterally no pain to chest wall compression.  Good respiratory excursion.  No audible wheezing CV:  Pulses intact.  Regular rhythm.  No major extremity edema   Abdomen: Soft.  Mildy distended.  Nontender.  Colostomy left side flat but pink with bridge in place.  No flatus.  Serosanguineous drainage in bag most likely consistent with anasarca/edema.  no evidence of peritonitis.  No incarcerated hernias.   GIANT perineal/perianal  wound:  Massive wound of gluteus in morbidly obese woman.  Some skin edge and fat necrosis but no active fasciitis there.     Thinly feculent drainage around anus along left lateral rectum just inferior to blue gloved finger going up to the pelvis.  Tracks 12 cm cephalad.  No evidence of any rectal perforation and mucosa appears to be viable.  Healthy bleeding tissue.  Does clear up with packing.  Most likely going up to peritoneal reflection but does not fully breach into the peritoneal cavity.  This region packed more aggressively with Kerlix gauze.    Results:   Cultures: Recent Results (from the past 720 hour(s))  SARS Coronavirus 2 by RT PCR (hospital order, performed in [facility] hospital lab) Nasopharyngeal Nasopharyngeal Swab     Status: None   Collection Time: 12/21/19  6:26 PM   Specimen: Nasopharyngeal Swab  Result Value Ref Range Status   SARS Coronavirus 2 NEGATIVE NEGATIVE Final    Comment: (NOTE) SARS-CoV-2 target nucleic acids are NOT DETECTED. The SARS-CoV-2 RNA is generally  detectable in upper and lower respiratory specimens during the acute phase of infection. The lowest concentration of SARS-CoV-2 viral copies this assay can detect is 250 copies / mL. A negative result does not preclude SARS-CoV-2 infection and should not be used as the sole basis for treatment or other patient management decisions.  A negative result may occur with improper specimen collection / handling, submission of specimen other than nasopharyngeal swab, presence of viral mutation(s) within the areas targeted by this assay, and inadequate number of viral copies (<250 copies / mL). A negative result must be combined with clinical observations, patient history, and epidemiological information. Fact Sheet for Patients:   StrictlyIdeas.no Fact Sheet for Healthcare Providers: BankingDealers.co.za This test is not yet approved or cleared  by the [place] FDA and has been authorized for detection and/or diagnosis of SARS-CoV-2 by FDA under an Emergency Use Authorization (EUA).  This EUA will remain in effect (meaning this test can be used) for the duration of the COVID-19 declaration under Section 564(b)(1) of the Act, 21 U.S.C. section 360bbb-3(b)(1), unless the authorization is terminated or revoked sooner. Performed at [facility],  [place] [address] [place], [place] [address]   Urine Culture     Status: Abnormal   Collection Time: 12/22/19 11:12 AM   Specimen: Urine, Clean Catch  Result Value Ref Range Status   Specimen Description   Final    URINE, CLEAN CATCH Performed at [facility], [place] [address] [place], [place] [address]    Special Requests   Final    NONE Performed at [facility], [place] [address], [place], [place] [address]    Culture MULTIPLE SPECIES PRESENT, SUGGEST RECOLLECTION (A)  Final   Report Status 12/23/2019 FINAL  Final  Culture, blood (routine x 2)      Status: None   Collection Time: 12/23/19  4:53 AM   Specimen: BLOOD RIGHT HAND  Result Value Ref Range Status   Specimen Description BLOOD RIGHT HAND  Final   Special Requests   Final    BOTTLES DRAWN AEROBIC ONLY Blood Culture adequate volume   Culture   Final    NO GROWTH 5 DAYS Performed at [facility], [place] [address] [place], [place] [address]    Report Status 12/28/2019 FINAL  Final  Culture, blood (routine x 2)     Status: None   Collection Time: 12/23/19  4:53 AM   Specimen: BLOOD LEFT HAND  Result Value Ref Range Status   Specimen Description BLOOD LEFT HAND  Final   Special Requests   Final    BOTTLES DRAWN AEROBIC ONLY Blood Culture adequate volume   Culture   Final    NO GROWTH 5 DAYS Performed at [facility], [place] [address] [place], [place] [address]    Report Status 12/28/2019 FINAL  Final  MRSA PCR Screening     Status: None   Collection Time: 12/23/19  3:58 PM   Specimen: Nasal Mucosa; Nasopharyngeal  Result Value Ref Range Status   MRSA by PCR NEGATIVE NEGATIVE Final    Comment:        The GeneXpert MRSA Assay (FDA approved for NASAL specimens only), is one component of a comprehensive MRSA colonization surveillance program. It is not intended to diagnose MRSA infection nor to guide or monitor treatment for MRSA infections. Performed at [facility], [place] [address] [place], [place] [address]   Aerobic/Anaerobic Culture (surgical/deep wound)     Status: None (Preliminary result)   Collection Time: 12/23/19  7:26 PM   Specimen: PATH Other; Tissue  Result Value Ref Range Status   Specimen Description   Final    WOUND LEFT BUTTOCKS Performed at [facility], [place] [address] [place], [place] [address]    Special Requests   Final    NONE Performed at [facility], [place] [address], [place], [place] [address]    Gram Stain   Final    NO WBC SEEN ABUNDANT GRAM POSITIVE COCCI ABUNDANT GRAM  NEGATIVE RODS ABUNDANT GRAM POSITIVE RODS Performed at [facility], [place] [address], [place], [place] [address]    Culture   Final    FEW PROTEUS MIRABILIS FEW ESCHERICHIA COLI FEW ENTEROCOCCUS GALLINARUM SUSCEPTIBILITIES TO FOLLOW NO ANAEROBES ISOLATED; CULTURE IN PROGRESS FOR 5 DAYS    Report Status PENDING  Incomplete   Organism ID, Bacteria PROTEUS MIRABILIS  Final   Organism ID, Bacteria ESCHERICHIA COLI  Final      Susceptibility   Escherichia coli - MIC*    AMPICILLIN >=32 RESISTANT Resistant  CEFAZOLIN <=4 SENSITIVE Sensitive     CEFEPIME <=1 SENSITIVE Sensitive     CEFTAZIDIME <=1 SENSITIVE Sensitive     CEFTRIAXONE <=1 SENSITIVE Sensitive     CIPROFLOXACIN <=0.25 SENSITIVE Sensitive     GENTAMICIN <=1 SENSITIVE Sensitive     IMIPENEM <=0.25 SENSITIVE Sensitive     TRIMETH/SULFA <=20 SENSITIVE Sensitive     AMPICILLIN/SULBACTAM 16 INTERMEDIATE Intermediate     PIP/TAZO <=4 SENSITIVE Sensitive     * FEW ESCHERICHIA COLI   Proteus mirabilis - MIC*    AMPICILLIN <=2 SENSITIVE Sensitive     CEFAZOLIN <=4 SENSITIVE Sensitive     CEFEPIME <=1 SENSITIVE Sensitive     CEFTAZIDIME <=1 SENSITIVE Sensitive     CEFTRIAXONE <=1 SENSITIVE Sensitive     CIPROFLOXACIN <=0.25 SENSITIVE Sensitive     GENTAMICIN <=1 SENSITIVE Sensitive     IMIPENEM 1 SENSITIVE Sensitive     TRIMETH/SULFA <=20 SENSITIVE Sensitive     AMPICILLIN/SULBACTAM <=2 SENSITIVE Sensitive     PIP/TAZO <=4 SENSITIVE Sensitive     * FEW PROTEUS MIRABILIS    Labs: Results for orders placed or performed during the hospital encounter of 12/21/19 (from the past 48 hour(s))  Glucose, capillary     Status: Abnormal   Collection Time: 12/26/19 12:10 PM  Result Value Ref Range   Glucose-Capillary 126 (H) 70 - 99 mg/dL    Comment: Glucose reference range applies only to samples taken after fasting for at least 8 hours.   Comment 1 Notify RN    Comment 2 Document in Chart   Vancomycin, trough     Status:  Abnormal   Collection Time: 12/26/19  3:45 PM  Result Value Ref Range   Vancomycin Tr 57 (HH) 15 - 20 ug/mL    Comment: CRITICAL RESULT CALLED TO, READ BACK BY AND VERIFIED WITH: H.ROMINES,RN [id number] @1740  BY V.WILKINS Performed at [facility], [place] [address] [place], [place] [address]   Glucose, capillary     Status: Abnormal   Collection Time: 12/26/19  4:16 PM  Result Value Ref Range   Glucose-Capillary 151 (H) 70 - 99 mg/dL    Comment: Glucose reference range applies only to samples taken after fasting for at least 8 hours.   Comment 1 Notify RN    Comment 2 Document in Chart   Basic metabolic panel     Status: Abnormal   Collection Time: 12/26/19  5:57 PM  Result Value Ref Range   Sodium 140 135 - 145 mmol/L   Potassium 3.3 (L) 3.5 - 5.1 mmol/L   Chloride 115 (H) 98 - 111 mmol/L   CO2 18 (L) 22 - 32 mmol/L   Glucose, Bld 162 (H) 70 - 99 mg/dL    Comment: Glucose reference range applies only to samples taken after fasting for at least 8 hours.   BUN 32 (H) 6 - 20 mg/dL   Creatinine, Ser 0.67 0.44 - 1.00 mg/dL   Calcium 7.5 (L) 8.9 - 10.3 mg/dL   GFR calc non Af Amer >60 >60 mL/min   GFR calc Af Amer >60 >60 mL/min   Anion gap 7 5 - 15    Comment: Performed at [facility], [place] [address], [place], [place] [address]  Glucose, capillary     Status: Abnormal   Collection Time: 12/26/19  8:14 PM  Result Value Ref Range   Glucose-Capillary 122 (H) 70 - 99 mg/dL    Comment: Glucose reference range applies only to  samples taken after fasting for at least 8 hours.   Comment 1 Notify RN    Comment 2 Document in Chart   Glucose, capillary     Status: Abnormal   Collection Time: 12/27/19 12:03 AM  Result Value Ref Range   Glucose-Capillary 141 (H) 70 - 99 mg/dL    Comment: Glucose reference range applies only to samples taken after fasting for at least 8 hours.   Comment 1 Notify RN    Comment 2 Document in Chart   Blood gas, arterial      Status: Abnormal   Collection Time: 12/27/19  3:55 AM  Result Value Ref Range   FIO2 30.00    Delivery systems VENTILATOR    Mode PRESSURE REGULATED VOLUME CONTROL    VT 440 mL   LHR 16 resp/min   Peep/cpap 5.0 cm H20   pH, Arterial 7.394 7.35 - 7.45   pCO2 arterial 28.4 (L) 32 - 48 mmHg   pO2, Arterial 128 (H) 83 - 108 mmHg   Bicarbonate 16.6 (L) 20.0 - 28.0 mmol/L   Acid-base deficit 6.6 (H) 0.0 - 2.0 mmol/L   O2 Saturation 98.6 %   Patient temperature 101.9    Collection site A-LINE    Drawn by [phone]    Sample type ARTERIAL     Comment: Performed at [facility], [place] [address], [place], [place] [address]  Glucose, capillary     Status: Abnormal   Collection Time: 12/27/19  4:01 AM  Result Value Ref Range   Glucose-Capillary 147 (H) 70 - 99 mg/dL    Comment: Glucose reference range applies only to samples taken after fasting for at least 8 hours.  Triglycerides     Status: None   Collection Time: 12/27/19  4:15 AM  Result Value Ref Range   Triglycerides 71 <150 mg/dL    Comment: Performed at [facility], [place] [address] [place],  [address]  Comprehensive metabolic panel     Status: Abnormal   Collection Time: 12/27/19  4:15 AM  Result Value Ref Range   Sodium 139 135 - 145 mmol/L   Potassium 3.3 (L) 3.5 - 5.1 mmol/L   Chloride 114 (H) 98 - 111 mmol/L   CO2 17 (L) 22 - 32 mmol/L   Glucose, Bld 159 (H) 70 - 99 mg/dL    Comment: Glucose reference range applies only to samples taken after fasting for at least 8 hours.   BUN 39 (H) 6 - 20 mg/dL   Creatinine, Ser 0.72 0.44 - 1.00 mg/dL   Calcium 7.8 (L) 8.9 - 10.3 mg/dL   Total Protein 5.2 (L) 6.5 - 8.1 g/dL   Albumin 1.9 (L) 3.5 - 5.0 g/dL   AST 32 15 - 41 U/L   ALT 21 0 - 44 U/L   Alkaline Phosphatase 250 (H) 38 - 126 U/L   Total Bilirubin 2.2 (H) 0.3 - 1.2 mg/dL   GFR calc non Af Amer >60 >60 mL/min   GFR calc Af Amer >60 >60 mL/min   Anion gap 8 5 - 15    Comment:  Performed at [facility], [place] [address], [place],  [address]  CBC     Status: Abnormal   Collection Time: 12/27/19  4:15 AM  Result Value Ref Range   WBC 20.4 (H) 4.0 - 10.5 K/uL   RBC 2.41 (L) 3.87 - 5.11 MIL/uL   Hemoglobin 8.0 (L) 12.0 - 15.0 g/dL   HCT  23.7 (L) 36 - 46 %   MCV 98.3 80.0 - 100.0 fL   MCH 33.2 26.0 - 34.0 pg   MCHC 33.8 30.0 - 36.0 g/dL   RDW 16.6 (H) 11.5 - 15.5 %   Platelets 118 (L) 150 - 400 K/uL    Comment: Immature Platelet Fraction may be clinically indicated, consider ordering this additional test [id number] REPEATED TO VERIFY    nRBC 0.2 0.0 - 0.2 %    Comment: Performed at [facility], [place] [address] [place], [place] [address]  Glucose, capillary     Status: Abnormal   Collection Time: 12/27/19  7:59 AM  Result Value Ref Range   Glucose-Capillary 150 (H) 70 - 99 mg/dL    Comment: Glucose reference range applies only to samples taken after fasting for at least 8 hours.  Vancomycin, random     Status: None   Collection Time: 12/27/19  8:24 AM  Result Value Ref Range   Vancomycin Rm 39     Comment:        Random Vancomycin therapeutic range is dependent on dosage and time of specimen collection. A peak range is 20.0-40.0 ug/mL A trough range is 5.0-15.0 ug/mL        Performed at [place] [address] [place], [place] [address]   Glucose, capillary     Status: Abnormal   Collection Time: 12/27/19 11:55 AM  Result Value Ref Range   Glucose-Capillary 159 (H) 70 - 99 mg/dL    Comment: Glucose reference range applies only to samples taken after fasting for at least 8 hours.   Comment 1 Notify RN    Comment 2 Document in Chart   Glucose, capillary     Status: Abnormal   Collection Time: 12/27/19  4:53 PM  Result Value Ref Range   Glucose-Capillary 139 (H) 70 - 99 mg/dL    Comment: Glucose reference range applies only to samples taken after fasting for at least 8 hours.   Comment 1 Notify  RN    Comment 2 Document in Chart   Glucose, capillary     Status: Abnormal   Collection Time: 12/27/19  7:44 PM  Result Value Ref Range   Glucose-Capillary 137 (H) 70 - 99 mg/dL    Comment: Glucose reference range applies only to samples taken after fasting for at least 8 hours.   Comment 1 Notify RN    Comment 2 Document in Chart   Vancomycin, random     Status: None   Collection Time: 12/27/19  8:21 PM  Result Value Ref Range   Vancomycin Rm 28     Comment:        Random Vancomycin therapeutic range is dependent on dosage and time of specimen collection. A peak range is 20.0-40.0 ug/mL A trough range is 5.0-15.0 ug/mL        Performed at [place] [address], [place], [place] [address]   Glucose, capillary     Status: Abnormal   Collection Time: 12/28/19 12:19 AM  Result Value Ref Range   Glucose-Capillary 147 (H) 70 - 99 mg/dL    Comment: Glucose reference range applies only to samples taken after fasting for at least 8 hours.   Comment 1 Notify RN    Comment 2 Document in Chart   Glucose, capillary     Status: Abnormal   Collection Time: 12/28/19  4:18 AM  Result Value Ref Range   Glucose-Capillary 131 (H)  70 - 99 mg/dL    Comment: Glucose reference range applies only to samples taken after fasting for at least 8 hours.  Comprehensive metabolic panel     Status: Abnormal   Collection Time: 12/28/19  4:35 AM  Result Value Ref Range   Sodium 142 135 - 145 mmol/L   Potassium 3.9 3.5 - 5.1 mmol/L   Chloride 118 (H) 98 - 111 mmol/L   CO2 16 (L) 22 - 32 mmol/L   Glucose, Bld 168 (H) 70 - 99 mg/dL    Comment: Glucose reference range applies only to samples taken after fasting for at least 8 hours.   BUN 46 (H) 6 - 20 mg/dL   Creatinine, Ser 0.55 0.44 - 1.00 mg/dL   Calcium 7.6 (L) 8.9 - 10.3 mg/dL   Total Protein 5.3 (L) 6.5 - 8.1 g/dL   Albumin 1.5 (L) 3.5 - 5.0 g/dL   AST 45 (H) 15 - 41 U/L   ALT 20 0 - 44 U/L   Alkaline Phosphatase 262 (H) 38 - 126 U/L    Total Bilirubin 1.4 (H) 0.3 - 1.2 mg/dL   GFR calc non Af Amer >60 >60 mL/min   GFR calc Af Amer >60 >60 mL/min   Anion gap 8 5 - 15    Comment: Performed at [facility], [place] [address], [place], [place] [address]  CBC with Differential/Platelet     Status: Abnormal   Collection Time: 12/28/19  4:35 AM  Result Value Ref Range   WBC 21.2 (H) 4.0 - 10.5 K/uL   RBC 2.36 (L) 3.87 - 5.11 MIL/uL   Hemoglobin 7.6 (L) 12.0 - 15.0 g/dL   HCT 23.4 (L) 36 - 46 %   MCV 99.2 80.0 - 100.0 fL   MCH 32.2 26.0 - 34.0 pg   MCHC 32.5 30.0 - 36.0 g/dL   RDW 17.2 (H) 11.5 - 15.5 %   Platelets 140 (L) 150 - 400 K/uL   nRBC 0.2 0.0 - 0.2 %   Neutrophils Relative % 67 %   Neutro Abs 14.5 (H) 1.7 - 7.7 K/uL   Lymphocytes Relative 16 %   Lymphs Abs 3.4 0.7 - 4.0 K/uL   Monocytes Relative 4 %   Monocytes Absolute 0.8 0 - 1 K/uL   Eosinophils Relative 1 %   Eosinophils Absolute 0.2 0 - 0 K/uL   Basophils Relative 1 %   Basophils Absolute 0.1 0 - 0 K/uL   Immature Granulocytes 11 %   Abs Immature Granulocytes 2.25 (H) 0.00 - 0.07 K/uL   Dohle Bodies PRESENT    Polychromasia PRESENT    Target Cells PRESENT     Comment: Performed at [facility], [place] [address] [place], [place] [address]  Glucose, capillary     Status: Abnormal   Collection Time: 12/28/19  8:00 AM  Result Value Ref Range   Glucose-Capillary 155 (H) 70 - 99 mg/dL    Comment: Glucose reference range applies only to samples taken after fasting for at least 8 hours.   Comment 1 Notify RN    Comment 2 Document in Chart     Imaging / Studies: DG Chest Port 1 View  Result Date: 12/28/2019 CLINICAL DATA:  Acute respiratory failure with hypoxemia. EXAM: PORTABLE CHEST 1 VIEW COMPARISON:  12/27/2019; 12/25/2019; 12/23/2019 FINDINGS: Grossly unchanged cardiac silhouette and mediastinal contours. Stable positioning of support apparatus. There is persistent mild elevation/eventration involving the right  hemidiaphragm with associated bibasilar heterogeneous/consolidative opacities,  right greater than left. No new focal airspace opacities. No pleural effusion or pneumothorax. No evidence of edema. No acute osseous abnormalities. IMPRESSION: 1.  Stable positioning of support apparatus.  No pneumothorax. 2. Similar appearing bibasilar heterogeneous/consolidative opacities, right greater than left, atelectasis versus infiltrate. Electronically Signed   By: Sandi Mariscal M.D.   On: 12/28/2019 04:31   DG Chest Port 1 View  Result Date: 12/27/2019 CLINICAL DATA:  Acute respiratory failure. EXAM: PORTABLE CHEST 1 VIEW COMPARISON:  12/25/2019 FINDINGS: Grossly unchanged cardiac silhouette and mediastinal contours given persistently reduced lung volumes and patient rotation. Stable position of support apparatus. No pneumothorax. Minimally improved aeration of lung bases with persistent bibasilar opacities, right greater than left. Mild pulmonary is congestion without frank evidence of edema. No pneumothorax. No evidence of edema. No acute osseous abnormalities. IMPRESSION: 1.  Stable positioning of support apparatus.  No pneumothorax. 2. Slightly improved aeration of lungs with persistent bibasilar opacities, right greater than left, atelectasis versus infiltrate. 3. Pulmonary venous congestion without frank evidence of edema. Electronically Signed   By: Sandi Mariscal M.D.   On: 12/27/2019 04:24    Medications / Allergies: per chart  Antibiotics: Anti-infectives (From admission, onward)   Start     Dose/Rate Route Frequency Ordered Stop   12/26/19 1948  vancomycin variable dose per unstable renal function (pharmacist dosing)     Discontinue      Does not apply See admin instructions 12/26/19 1948     12/23/19 2000  vancomycin (VANCOCIN) IVPB 1000 mg/200 mL premix  Status:  Discontinued        1,000 mg 200 mL/hr over 60 Minutes Intravenous Every 8 hours 12/23/19 1027 12/26/19 1944   12/23/19 1300  clindamycin  (CLEOCIN) IVPB 900 mg     Discontinue     900 mg 100 mL/hr over 30 Minutes Intravenous Every 8 hours 12/23/19 1205     12/23/19 1200  metroNIDAZOLE (FLAGYL) IVPB 500 mg  Status:  Discontinued        500 mg 100 mL/hr over 60 Minutes Intravenous Every 8 hours 12/23/19 1135 12/23/19 1254   12/23/19 1045  vancomycin (VANCOREADY) IVPB 2000 mg/400 mL        2,000 mg 200 mL/hr over 120 Minutes Intravenous NOW 12/23/19 1018 12/23/19 1441   12/23/19 0300  cefTRIAXone (ROCEPHIN) 2 g in sodium chloride 0.9 % 100 mL IVPB     Discontinue     2 g 200 mL/hr over 30 Minutes Intravenous Every 24 hours 12/23/19 0236          Note: Portions of this report may have been transcribed using voice recognition software. Every effort was made to ensure accuracy; however, inadvertent computerized transcription errors may be present.   Any transcriptional errors that result from this process are unintentional.    Adin Hector, MD, FACS, MASCRS Gastrointestinal and Minimally Invasive Surgery  [facility] [address] [place], [place] [address] [phone] Fax [phone] Main/Paging  CONTACT INFORMATION: Weekday (9AM-5PM) concerns: Call [facility] main office at [phone] Weeknight (5PM-9AM) or Weekend/Holiday concerns: Check [email or web address] for General Surgery [facility] coverage (Please, do not use SecureChat as it is not reliable communication to operating surgeons for immediate patient care)      12/28/2019  8:26 AM

## 2019-12-28 NOTE — Progress Notes (Signed)
[place] Progress Note Patient Name: Alyssa Carey DOB: 1971/02/02 MRN: [id number]   Date of Service  12/28/2019  HPI/Events of Note  RN requests order to replace existing foley with a temperature sensing foley due to inability to get reliable oral/axillary temps, and buttock wound preventing rectal temp measurement.   eICU Interventions  Order for temperature-sensing foley placed.     Intervention Category Minor Interventions: Routine modifications to care plan (e.g. PRN medications for pain, fever)  Alyssa Carey 12/28/2019, 2:14 AM

## 2019-12-29 LAB — CBC
HCT: 23.5 % — ABNORMAL LOW (ref 36.0–46.0)
Hemoglobin: 7.4 g/dL — ABNORMAL LOW (ref 12.0–15.0)
MCH: 32.9 pg (ref 26.0–34.0)
MCHC: 31.5 g/dL (ref 30.0–36.0)
MCV: 104.4 fL — ABNORMAL HIGH (ref 80.0–100.0)
Platelets: 194 10*3/uL (ref 150–400)
RBC: 2.25 MIL/uL — ABNORMAL LOW (ref 3.87–5.11)
RDW: 17.5 % — ABNORMAL HIGH (ref 11.5–15.5)
WBC: 21.9 10*3/uL — ABNORMAL HIGH (ref 4.0–10.5)
nRBC: 0.3 % — ABNORMAL HIGH (ref 0.0–0.2)

## 2019-12-29 LAB — COMPREHENSIVE METABOLIC PANEL
ALT: 21 U/L (ref 0–44)
AST: 42 U/L — ABNORMAL HIGH (ref 15–41)
Albumin: 1.4 g/dL — ABNORMAL LOW (ref 3.5–5.0)
Alkaline Phosphatase: 228 U/L — ABNORMAL HIGH (ref 38–126)
Anion gap: 8 (ref 5–15)
BUN: 49 mg/dL — ABNORMAL HIGH (ref 6–20)
CO2: 20 mmol/L — ABNORMAL LOW (ref 22–32)
Calcium: 8.1 mg/dL — ABNORMAL LOW (ref 8.9–10.3)
Chloride: 116 mmol/L — ABNORMAL HIGH (ref 98–111)
Creatinine, Ser: 0.5 mg/dL (ref 0.44–1.00)
GFR calc Af Amer: >60 mL/min (ref >60)
GFR calc non Af Amer: >60 mL/min (ref >60)
Glucose, Bld: 152 mg/dL — ABNORMAL HIGH (ref 70–99)
Potassium: 3.9 mmol/L (ref 3.5–5.1)
Sodium: 144 mmol/L (ref 135–145)
Total Bilirubin: 1.3 mg/dL — ABNORMAL HIGH (ref 0.3–1.2)
Total Protein: 5.6 g/dL — ABNORMAL LOW (ref 6.5–8.1)

## 2019-12-29 LAB — GLUCOSE, CAPILLARY
Glucose-Capillary: 124 mg/dL — ABNORMAL HIGH (ref 70–99)
Glucose-Capillary: 119 mg/dL — ABNORMAL HIGH (ref 70–99)
Glucose-Capillary: 140 mg/dL — ABNORMAL HIGH (ref 70–99)
Glucose-Capillary: 141 mg/dL — ABNORMAL HIGH (ref 70–99)
Glucose-Capillary: 135 mg/dL — ABNORMAL HIGH (ref 70–99)

## 2019-12-29 MED ORDER — ACETAMINOPHEN 160 MG/5ML PO SOLN
650.0000 mg | ORAL | Status: DC | PRN
  Administered 2019-12-29 – 2020-01-04 (×12): 650 mg
  Filled 2019-12-29 (×12): qty 20.3

## 2019-12-29 NOTE — Progress Notes (Signed)
Patient only on Dex, no additional sedation running.  Patients eyes open and tracking but not following commands.

## 2019-12-29 NOTE — Progress Notes (Signed)
Rt started weaning 10/5 and 30%.     The Pt wasn't getting enough VT and RT increased the pressure to 15. The VT is now 386-440. RR 33 and HR is 95. Pt seems to doing OK on wean

## 2019-12-29 NOTE — Progress Notes (Signed)
   12/29/19  [phone]  PT Hydro treatment note  Subjective Assessment  Subjective pt on vent, sedated   Patient and Family Stated Goals unable to state/on vent sedated   Date of Onset  (abcess present on admission)  Prior Treatments s/p surgical I & D x2, diverting colostomy  Evaluation and Treatment  Evaluation and Treatment Procedures Explained to Patient/Family Yes (eyes open, does not follow)  Evaluation and Treatment Procedures Patient unable to consent due to mental status  Wound / Incision (Open or Dehisced) 12/27/19 Non-pressure wound Buttocks Left *PT ONLY* open wound L gluteal area  Date First Assessed/Time First Assessed: 12/27/19 1410   Wound Type: Non-pressure wound  Location: Buttocks  Location Orientation: Left  Wound Description (Comments): *PT ONLY* open wound L gluteal area  Present on Admission: (c)   Dressing Type Foam - Lift dressing to assess site every shift;Moist to dry  Dressing Changed Changed  Dressing Status Clean;Dry;Intact  Dressing Change Frequency PRN  Site / Wound Assessment Friable;Bleeding (multiple skin  sloughing arperiwound.)  % Wound base Red or Granulating 5%  % Wound base Yellow/Fibrinous Exudate 60%  % Wound base Black/Eschar 20%  % Wound base Other/Granulation Tissue (Comment) 15%  Peri-wound Assessment Black;Excoriated;Maceration  Wound Length (cm) 17 cm  Wound Width (cm) 13 cm  Wound Depth (cm) 8 cm  Wound Volume (cm^3) 1768 cm^3  Wound Surface Area (cm^2) 221 cm^2  Tunneling (cm) 5 (in center of base near anus)  Margins Unattached edges (unapproximated)  Drainage Amount Copious  Drainage Description Serous;Serosanguineous;No odor  Non-staged Wound Description Full thickness  Treatment Hydrotherapy (Pulse lavage);Packing (Saline gauze)Mepilex borders placed  Over denuded areas around wound.  Hydrotherapy  Pulsed lavage therapy - wound location L gluteal area   Pulsed Lavage with Suction (psi) 12 psi  Pulsed Lavage with Suction -  Normal Saline Used  (1500)  Pulsed Lavage Tip Tip with splash shield  Wound Therapy - Assess/Plan/Recommendations  Wound Therapy - Clinical Statement Pt with extensive  open wound L gluteal area d/t necrotizing fasciitis, s/p surgical  I and D x2. Surgery PA in to inspect wound, Pururlent drainage from track  To the left of anal opening. Large area of necrotic adipose  caudal end of  wound per PA.  Wound Therapy - Functional Problem List immobility, sedated on vent   Factors Delaying/Impairing Wound Healing Substance abuse;Immobility;Multiple medical problems  Hydrotherapy Plan Debridement;Dressing change;Patient/family education;Pulsatile lavage with suction  Wound Therapy - Frequency 6X / week  Wound Therapy - Current Recommendations PT (when less sedated)  Wound Therapy - Follow Up Recommendations Skilled nursing facility (LTAC)  Wound Plan Pt will benefit from hydrotherapy as part a multi-modal/multi-disciplinary wound care to approach to facilitate healing and decr bioburden.   Wound Therapy Goals - Improve the function of patient's integumentary system by progressing the wound(s) through the phases of wound healing by:  Decrease Necrotic Tissue to 50  Decrease Necrotic Tissue - Progress Progressing toward goal  Increase Granulation Tissue to 50  Increase Granulation Tissue - Progress Progressing toward goal  Decrease Length/Width/Depth by (cm) -/-/3  Improve Drainage Characteristics Mod  Improve Drainage Characteristics - Progress Progressing toward goal  Goals/treatment plan/discharge plan were made with and agreed upon by patient/family No, Patient unable to participate in goals/treatment/discharge plan and family unavailable  Time For Goal Achievement Other (comment) (3 wks)  Wound Therapy - Potential for Goals [place]  [place] Pager [phone] Office [phone]

## 2019-12-29 NOTE — Progress Notes (Signed)
eLink Physician-Brief Progress Note Patient Name: Alyssa Carey DOB: [birth year] MRN: [id number]   Date of Service  12/29/2019  HPI/Events of Note  Fever to 103. Request for AM labs.  eICU Interventions  Acetaminophen order changed to Q4H PRN. Cooling blanket ordered. AM labs ordered.     Intervention Category Minor Interventions: Routine modifications to care plan (e.g. PRN medications for pain, fever)  Alyssa Carey 12/29/2019, 4:02 AM

## 2019-12-29 NOTE — Progress Notes (Signed)
NAME:  Alyssa Carey, MRN:  [id number], DOB:  1971/02/15, LOS: 8 ADMISSION DATE:  12/21/2019, CONSULTATION DATE:  6/8 REFERRING MD:  Kshitiz, CHIEF COMPLAINT:  Confusion   Brief History   49 y/o female with a heavy alcohol history admitted in the setting of confusion and presumed alcohol withdrawal who later developed sepsis from a gas forming wound.    Past Medical History  Alcohol abuse  Significant Hospital Events   6/6 admission 6/8 ICU admission to OR ofr I&D, back intubated and on pressors, 6/9 started tube feeds. Still pressor dependent.kept on vent w/ anticipated return to OR planned  6/10: Back to the OR for significant debridement and also placement of diverting colostomy 6/11: Still sedated.  Hemodynamically still requiring low-dose phenylephrine.  Surgery felt wound looking good. Plan to initiate weaning. Sedation changed to precedex and PRN fent  6/12 more awake, on 15/5 pressure support 6/13 family discussion >> DNR, continue medical care  Consults:  General surgery  Procedures:  ETT 6/8>>> Right IJ CVL 6/8>>> Radial aline left 6/8>>  Significant Diagnostic Tests:   6/6 CT head > NAICP  6/7 U/S abdomen > hepatic steatosis  6/8 CT Pelvis > gas throughout medial subcutaneous soft tissues of left buttock into the left perineum  Micro Data:  6/06 SARS COV 2 > negative 6/08 blood > negative 6/08 Lt buttocks wound > Proteus, E coli (resistant to ampicillin, intermediate to unasyn), VRE 6/13 blood >   Antimicrobials:  Rocephin 6/07 >> Clindamycin 6/08 >> Flagyl 6/08 Vancomycin 6/08 >> 6/11 Linezolid 6/13 >>   Interim history/subjective:  Remains on pressors.  Still having fever.  Objective   Blood pressure 113/67, pulse 91, temperature (!) 102.4 F (39.1 C), temperature source Axillary, resp. rate (!) 28, height 5' 4"  (1.626 m), weight 105.6 kg, last menstrual period 03/23/2019, SpO2 100 %.    Vent Mode: PRVC FiO2 (%):  [30 %] 30 % Set Rate:  [16 bmp]  16 bmp Vt Set:  [440 mL] 440 mL PEEP:  [5 cmH20] 5 cmH20 Plateau Pressure:  [23 cmH20-25 cmH20] 23 cmH20   Intake/Output Summary (Last 24 hours) at 12/29/2019 0813 Last data filed at 12/29/2019 0600 Gross per 24 hour  Intake 2558.07 ml  Output 825 ml  Net 1733.07 ml   Filed Weights   12/27/19 0500 12/28/19 0500 12/29/19 0422  Weight: 105 kg 105.6 kg 105.6 kg    Examination:  General - sedated Eyes - pupils reactive ENT - ETT in place Cardiac - regular rate/rhythm, no murmur Chest - decreased BS, scattered rhonchi Abdomen - soft, non tender, colostomy in place Extremities - 2+ edema Skin - wound dressings in place Neuro - RASS -2  Resolved Hospital Problem list   Non gap metabolic acidosis, thrombocytopenia in 2nd to sepsis and alcohol abuse  Assessment & Plan:   Septic shock from necrotizing fasciitis of Lt buttock s/p debridement. - Proteus, E coli, VRE in wound culture - wean pressors to keep MAP > 65 - continue IV fluids - day 8 of ABx; currently on rocephin, linezolid, clindamycin - no further surgeries planned at this point - to start hydrotherapy  Acute hypoxic respiratory failure in setting of septic shock. - pressure support wean as able - not ready for extubation trial yet due to deconditioning - f/u CXR - might need trach to assist with weaning if she doesn't improve quicker  Acute metabolic encephalopathy 2nd to sepsis and hepatic encephalopathy. - RASS goal 0 to -1 - continue  lactulose  Hyperglycemia. - no hx of DM - SSI  Elevated LFTs in setting of acute alcoholic hepatitis with fatty liver. - f/u LFTs intermittently  Anemia of critical illness and chronic disease. - f/u CBC - transfuse for Hb < 7 or significant bleeding - check iron levels   Best practice:  Nutrition: tube feeding SUP: protonix DVT prophylaxis: lovenox Code status: DNR Disposition: ICU  Labs:   CMP Latest Ref Rng & Units 12/29/2019 12/28/2019 12/27/2019  Glucose 70 -  99 mg/dL 152(H) 168(H) 159(H)  BUN 6 - 20 mg/dL 49(H) 46(H) 39(H)  Creatinine 0.44 - 1.00 mg/dL 0.50 0.55 0.72  Sodium 135 - 145 mmol/L 144 142 139  Potassium 3.5 - 5.1 mmol/L 3.9 3.9 3.3(L)  Chloride 98 - 111 mmol/L 116(H) 118(H) 114(H)  CO2 22 - 32 mmol/L 20(L) 16(L) 17(L)  Calcium 8.9 - 10.3 mg/dL 8.1(L) 7.6(L) 7.8(L)  Total Protein 6.5 - 8.1 g/dL 5.6(L) 5.3(L) 5.2(L)  Total Bilirubin 0.3 - 1.2 mg/dL 1.3(H) 1.4(H) 2.2(H)  Alkaline Phos 38 - 126 U/L 228(H) 262(H) 250(H)  AST 15 - 41 U/L 42(H) 45(H) 32  ALT 0 - 44 U/L 21 20 21     CBC Latest Ref Rng & Units 12/29/2019 12/28/2019 12/27/2019  WBC 4.0 - 10.5 K/uL 21.9(H) 21.2(H) 20.4(H)  Hemoglobin 12.0 - 15.0 g/dL 7.4(L) 7.6(L) 8.0(L)  Hematocrit 36 - 46 % 23.5(L) 23.4(L) 23.7(L)  Platelets 150 - 400 K/uL 194 140(L) 118(L)    ABG    Component Value Date/Time   PHART 7.394 12/27/2019 0355   PCO2ART 28.4 (L) 12/27/2019 0355   PO2ART 128 (H) 12/27/2019 0355   HCO3 16.6 (L) 12/27/2019 0355   TCO2 17 (L) 12/25/2019 1726   ACIDBASEDEF 6.6 (H) 12/27/2019 0355   O2SAT 98.6 12/27/2019 0355    CBG (last 3)  Recent Labs    12/28/19 2350 12/29/19 0335 12/29/19 0742  GLUCAP 153* 124* 119*    Critical care time: 39 minutes  Chesley Mires, MD [place] Pager - [phone] 12/29/2019, 8:29 AM

## 2019-12-29 NOTE — TOC Progression Note (Addendum)
Transition of Care [facility]) - Progression Note    Patient Details  Name: Alyssa Carey MRN: [id number] Date of Birth: 1971/05/21  Transition of [facility]) CM/SW Contact  Leeroy Cha, RN Phone Number: 12/29/2019, 10:36 AM  Clinical Narrative:    Pod 4 from surgical debridement buttock and thigh with open tissues and drainge.On vewnt temp 103.5 Plan: following for snf needs versus home with hhc.   Expected Discharge Plan: Skilled Nursing Facility Barriers to Discharge: Continued Medical Work up  Expected Discharge Plan and Services Expected Discharge Plan: [place]                                               Social Determinants of Health (SDOH) Interventions    Readmission Risk Interventions No flowsheet data found.

## 2019-12-29 NOTE — Progress Notes (Signed)
RT started changing the Pt's A line. The A line had a medal tip. The wave form was good but RT wasn't able to draw off the line. RT continued to replace the the set up and dress the a line to see if she could pull blood off. RT wasn't able to pull blood even though when she started there was a good pressure on the screen. The a line had not been redressed after the pt came back from surgery and had 2 pink strips of tape above and below the site and a bio patch. The site was wet and bloody. RT tried to save the line but wasn't able to. RT cleaned the site and went to empty the old bag and the bag was a 500 ml bag instead of a 1000. The bad was almost completely out of saline. RT informed the nurse that the a line had been lost.

## 2019-12-29 NOTE — Progress Notes (Signed)
Patient ID: Alyssa Carey, female   DOB: Jul 17, 1971, 49 y.o.   MRN: [id number]    4 Days Post-Op  Subjective: On vent  ROS: See above, otherwise other systems negative  Objective: Vital signs in last 24 hours: Temp:  [97.5 F (36.4 C)-103.5 F (39.7 C)] 101.6 F (38.7 C) (06/14 0800) Pulse Rate:  [76-102] 91 (06/14 0615) Resp:  [24-36] 28 (06/14 0615) BP: (97-118)/(55-67) 113/67 (06/14 0301) SpO2:  [100 %] 100 % (06/14 0825) Arterial Line BP: (70-132)/(43-85) 117/68 (06/14 0615) FiO2 (%):  [30 %] 30 % (06/14 0830) Weight:  [105.6 kg] 105.6 kg (06/14 0422) Last BM Date: 12/26/19  Intake/Output from previous day: 06/13 0701 - 06/14 0700 In: 2760.7 [I.V.:1940.1; IV Piggyback:820.6] Out: 825 [Urine:775; Stool:50] Intake/Output this shift: No intake/output data recorded.  PE: Skin: demarcated area at the superior aspect of the wound with necrotic skin and adipose tissue with tunneling that is new since Friday (based on pictures).  Wound is relatively clean.  Edges of wound with necrotic adipose tissue.  There is some tissue along what is likely rectum that finger fractures with tan purulent drainage noted.  See pictures below       Lab Results:  Recent Labs    12/28/19 0435 12/29/19 0433  WBC 21.2* 21.9*  HGB 7.6* 7.4*  HCT 23.4* 23.5*  PLT 140* 194   BMET Recent Labs    12/28/19 0435 12/29/19 0433  NA 142 144  K 3.9 3.9  CL 118* 116*  CO2 16* 20*  GLUCOSE 168* 152*  BUN 46* 49*  CREATININE 0.55 0.50  CALCIUM 7.6* 8.1*   PT/INR No results for input(s): LABPROT, INR in the last 72 hours. CMP     Component Value Date/Time   NA 144 12/29/2019 0433   K 3.9 12/29/2019 0433   CL 116 (H) 12/29/2019 0433   CO2 20 (L) 12/29/2019 0433   GLUCOSE 152 (H) 12/29/2019 0433   BUN 49 (H) 12/29/2019 0433   CREATININE 0.50 12/29/2019 0433   CALCIUM 8.1 (L) 12/29/2019 0433   PROT 5.6 (L) 12/29/2019 0433   ALBUMIN 1.4 (L) 12/29/2019 0433   AST 42 (H) 12/29/2019  0433   ALT 21 12/29/2019 0433   ALKPHOS 228 (H) 12/29/2019 0433   BILITOT 1.3 (H) 12/29/2019 0433   GFRNONAA >60 12/29/2019 0433   GFRAA >60 12/29/2019 0433   Lipase  No results found for: LIPASE     Studies/Results: DG Chest Port 1 View  Result Date: 12/28/2019 CLINICAL DATA:  Acute respiratory failure with hypoxemia. EXAM: PORTABLE CHEST 1 VIEW COMPARISON:  12/27/2019; 12/25/2019; 12/23/2019 FINDINGS: Grossly unchanged cardiac silhouette and mediastinal contours. Stable positioning of support apparatus. There is persistent mild elevation/eventration involving the right hemidiaphragm with associated bibasilar heterogeneous/consolidative opacities, right greater than left. No new focal airspace opacities. No pleural effusion or pneumothorax. No evidence of edema. No acute osseous abnormalities. IMPRESSION: 1.  Stable positioning of support apparatus.  No pneumothorax. 2. Similar appearing bibasilar heterogeneous/consolidative opacities, right greater than left, atelectasis versus infiltrate. Electronically Signed   By: Sandi Mariscal M.D.   On: 12/28/2019 04:31    Anti-infectives: Anti-infectives (From admission, onward)   Start     Dose/Rate Route Frequency Ordered Stop   12/28/19 1400  vancomycin (VANCOCIN) IVPB 1000 mg/200 mL premix  Status:  Discontinued        1,000 mg 200 mL/hr over 60 Minutes Intravenous Every 24 hours 12/28/19 1005 12/28/19 1343   12/28/19 1400  linezolid (ZYVOX)  IVPB 600 mg     Discontinue     600 mg 300 mL/hr over 60 Minutes Intravenous Every 12 hours 12/28/19 1343     12/26/19 1948  vancomycin variable dose per unstable renal function (pharmacist dosing)  Status:  Discontinued         Does not apply See admin instructions 12/26/19 1948 12/28/19 1005   12/23/19 2000  vancomycin (VANCOCIN) IVPB 1000 mg/200 mL premix  Status:  Discontinued        1,000 mg 200 mL/hr over 60 Minutes Intravenous Every 8 hours 12/23/19 1027 12/26/19 1944   12/23/19 1300  clindamycin  (CLEOCIN) IVPB 900 mg     Discontinue     900 mg 100 mL/hr over 30 Minutes Intravenous Every 8 hours 12/23/19 1205     12/23/19 1200  metroNIDAZOLE (FLAGYL) IVPB 500 mg  Status:  Discontinued        500 mg 100 mL/hr over 60 Minutes Intravenous Every 8 hours 12/23/19 1135 12/23/19 1254   12/23/19 1045  vancomycin (VANCOREADY) IVPB 2000 mg/400 mL        2,000 mg 200 mL/hr over 120 Minutes Intravenous NOW 12/23/19 1018 12/23/19 1441   12/23/19 0300  cefTRIAXone (ROCEPHIN) 2 g in sodium chloride 0.9 % 100 mL IVPB     Discontinue     2 g 200 mL/hr over 30 Minutes Intravenous Every 24 hours 12/23/19 0236         Assessment/Plan Hepatic encephalopathy Alcohol abuse Thrombocytopenia  HTN  Sepsis Necrotizing fasciitis of left buttock  -S/Pincision and debridement of skin, subcutaneous tissue, fascia and muscle 15x15x6cm, left buttock5/8 Dr. Romana Juniper  -S/P Debridement of skin, subcutaneous tissue, fascia and muscle left buttock, pulsatile lavage LAPAROSCOPIC LOOPDESCENDINGCOLOSTOMY 6/10 Dr. Redmond Pulling - ongoing management of respiratory failure and septic shock per CCM, appreciate their care - Continue IV abx  - dressing changes TID currently. -demarcated skin at the superior aspect of the wound will need to be excised.  She also has tan purulent fluid that tracks up the edge of the rectum.  This is concerning as if anything else needed to be debrided from this area she would need a significant surgery including an APR.  Continue to try and preserve this area as much as we can.  May need to return to OR for another debridement and washout in the coming days.  FEN: NPO, IVF, ok for TF ID: Rocephin, Clindamycin, Vancomycin VTE: SCD's Foley: placed 6/8   LOS: 8 days    Henreitta Cea , [facility] 12/29/2019, 10:11 AM Please see Amion for pager number during day hours 7:00am-4:30pm or 7:00am -11:30am on weekends

## 2019-12-29 NOTE — Progress Notes (Signed)
Pt weaned [phone]

## 2019-12-30 ENCOUNTER — Inpatient Hospital Stay (HOSPITAL_COMMUNITY): Payer: Medicaid Other

## 2019-12-30 LAB — CBC
HCT: 22.6 % — ABNORMAL LOW (ref 36.0–46.0)
Hemoglobin: 6.8 g/dL — CL (ref 12.0–15.0)
MCH: 33 pg (ref 26.0–34.0)
MCHC: 30.1 g/dL (ref 30.0–36.0)
MCV: 109.7 fL — ABNORMAL HIGH (ref 80.0–100.0)
Platelets: 259 10*3/uL (ref 150–400)
RBC: 2.06 MIL/uL — ABNORMAL LOW (ref 3.87–5.11)
RDW: 17.5 % — ABNORMAL HIGH (ref 11.5–15.5)
WBC: 20.9 10*3/uL — ABNORMAL HIGH (ref 4.0–10.5)
nRBC: 0.1 % (ref 0.0–0.2)

## 2019-12-30 LAB — AMMONIA: Ammonia: 41 umol/L — ABNORMAL HIGH (ref 9–35)

## 2019-12-30 LAB — PREPARE RBC (CROSSMATCH)

## 2019-12-30 LAB — COMPREHENSIVE METABOLIC PANEL
ALT: 23 U/L (ref 0–44)
AST: 41 U/L (ref 15–41)
Albumin: 1.3 g/dL — ABNORMAL LOW (ref 3.5–5.0)
Alkaline Phosphatase: 184 U/L — ABNORMAL HIGH (ref 38–126)
Anion gap: 11 (ref 5–15)
BUN: 55 mg/dL — ABNORMAL HIGH (ref 6–20)
CO2: 18 mmol/L — ABNORMAL LOW (ref 22–32)
Calcium: 8 mg/dL — ABNORMAL LOW (ref 8.9–10.3)
Chloride: 117 mmol/L — ABNORMAL HIGH (ref 98–111)
Creatinine, Ser: 0.65 mg/dL (ref 0.44–1.00)
GFR calc Af Amer: >60 mL/min (ref >60)
GFR calc non Af Amer: >60 mL/min (ref >60)
Glucose, Bld: 186 mg/dL — ABNORMAL HIGH (ref 70–99)
Potassium: 3.8 mmol/L (ref 3.5–5.1)
Sodium: 146 mmol/L — ABNORMAL HIGH (ref 135–145)
Total Bilirubin: 1.1 mg/dL (ref 0.3–1.2)
Total Protein: 6 g/dL — ABNORMAL LOW (ref 6.5–8.1)

## 2019-12-30 LAB — GLUCOSE, CAPILLARY
Glucose-Capillary: 130 mg/dL — ABNORMAL HIGH (ref 70–99)
Glucose-Capillary: 134 mg/dL — ABNORMAL HIGH (ref 70–99)
Glucose-Capillary: 141 mg/dL — ABNORMAL HIGH (ref 70–99)
Glucose-Capillary: 151 mg/dL — ABNORMAL HIGH (ref 70–99)
Glucose-Capillary: 153 mg/dL — ABNORMAL HIGH (ref 70–99)
Glucose-Capillary: 159 mg/dL — ABNORMAL HIGH (ref 70–99)
Glucose-Capillary: 163 mg/dL — ABNORMAL HIGH (ref 70–99)

## 2019-12-30 LAB — IRON AND TIBC: Iron: 28 ug/dL (ref 28–170)

## 2019-12-30 LAB — HEMOGLOBIN AND HEMATOCRIT, BLOOD
HCT: 25.1 % — ABNORMAL LOW (ref 36.0–46.0)
Hemoglobin: 7.8 g/dL — ABNORMAL LOW (ref 12.0–15.0)

## 2019-12-30 LAB — FERRITIN: Ferritin: 1091 ng/mL — ABNORMAL HIGH (ref 11–307)

## 2019-12-30 LAB — MAGNESIUM: Magnesium: 1.9 mg/dL (ref 1.7–2.4)

## 2019-12-30 LAB — PHOSPHORUS: Phosphorus: 3.3 mg/dL (ref 2.5–4.6)

## 2019-12-30 LAB — VITAMIN D 25 HYDROXY (VIT D DEFICIENCY, FRACTURES): Vit D, 25-Hydroxy: 16.41 ng/mL — ABNORMAL LOW (ref 30–100)

## 2019-12-30 LAB — TROPONIN I (HIGH SENSITIVITY): Troponin I (High Sensitivity): 11 ng/L (ref <18)

## 2019-12-30 MED ORDER — FERROUS SULFATE 300 (60 FE) MG/5ML PO SYRP
300.0000 mg | ORAL_SOLUTION | Freq: Two times a day (BID) | ORAL | Status: DC
  Administered 2019-12-30 – 2020-01-07 (×17): 300 mg
  Filled 2019-12-30 (×19): qty 5

## 2019-12-30 MED ORDER — ADULT MULTIVITAMIN W/MINERALS CH: 1.0000 | ORAL_TABLET | Freq: Every day | ORAL | Status: DC

## 2019-12-30 MED ORDER — FREE WATER
200.0000 mL | Freq: Four times a day (QID) | Status: DC
  Administered 2019-12-30 – 2020-01-06 (×23): 200 mL

## 2019-12-30 MED ORDER — PIVOT 1.5 CAL PO LIQD
1000.0000 mL | ORAL | Status: DC
  Administered 2019-12-30 – 2019-12-31 (×2): 1000 mL
  Filled 2019-12-30 (×4): qty 1000

## 2019-12-30 MED ORDER — PRO-STAT SUGAR FREE PO LIQD
60.0000 mL | Freq: Three times a day (TID) | ORAL | Status: DC
  Administered 2019-12-30 – 2020-01-05 (×16): 60 mL
  Filled 2019-12-30 (×14): qty 60

## 2019-12-30 MED ORDER — LACTULOSE 10 GM/15ML PO SOLN
20.0000 g | Freq: Every day | ORAL | Status: DC
  Administered 2019-12-30: 20 g via ORAL
  Filled 2019-12-30: qty 30

## 2019-12-30 MED ORDER — ASPIRIN 81 MG PO CHEW
81.0000 mg | CHEWABLE_TABLET | Freq: Every day | ORAL | Status: DC
  Administered 2019-12-30: 81 mg
  Filled 2019-12-30: qty 1

## 2019-12-30 MED ORDER — SODIUM CHLORIDE 0.9% IV SOLUTION: Freq: Once | INTRAVENOUS | Status: DC

## 2019-12-30 NOTE — Progress Notes (Signed)
[facility] Progress Note Patient Name: Alyssa Carey DOB: Sep 27, 1970 MRN: [id number]   Date of Service  12/30/2019  HPI/Events of Note  Anemia - Hgb = 6.8.   eICU Interventions  Will transfuse 1 unit PRBC.     Intervention Category Major Interventions: Other:  Lysle Dingwall 12/30/2019, 5:04 AM

## 2019-12-30 NOTE — Progress Notes (Signed)
Unable to see patient during hydrotherapy secondary to in a procedure.  We will look at wound tomorrow and decide if she needs anything further at that time.  Alyssa Carey 2:55 PM 12/30/2019

## 2019-12-30 NOTE — Progress Notes (Addendum)
Nutrition Follow-up  DOCUMENTATION CODES:   Obesity unspecified  INTERVENTION:   Initiate tube feeding via OG tube (tip in stomach): Pivot 1.5 at 50 ml/h (1200 ml per day) Pro-stat 60 ml TID  MVI with minerals daily -1 packet Juven BID, each packet provides 95 calories, 2.5 grams of protein (collagen), and 9.8 grams of carbohydrate (3 grams sugar); also contains 7 grams of L-arginine and L-glutamine, 300 mg vitamin C, 15 mg vitamin E, 1.2 mcg vitamin B-12, 9.5 mg zinc, 200 mg calcium, and 1.5 g  Calcium Beta-hydroxy-Beta-methylbutyrate to support wound healing  Provides 2400 kcal, 202 gm protein, 910 ml free water daily  200 ml free water every 6 hours Total free water: 1710 ml  Continue: Vitamin C, zinc, thiamine, folic acid, and ferrous sulfate  Concern for Vitamin D and A deficiencies; will obtain lab values  D/C Vital High Protein D/C liquid MVI  NUTRITION DIAGNOSIS:   Increased nutrient needs related to chronic illness, wound healing (alcohol abuse; necrotizing fasciitis) as evidenced by estimated needs.  Ongoing   GOAL:   Patient will meet greater than or equal to 90% of their needs  Meeting with TF.   MONITOR:   Vent status, TF tolerance, Labs, Weight trends, Skin  REASON FOR ASSESSMENT:   Ventilator, Consult Enteral/tube feeding initiation and management  ASSESSMENT:   Pt with PMH of heavy alcohol history who for one week with decreased intake admitted 6/6 with alcoholic hepatitis with hepatic encephalopathy and AKI.  Per CCM pt did not tolerate SBT this am and may need trach.  Per surgery plan to return to OR in 1-2 days.  Pt receiving hydrotherapy at time of visit.   6/8 s/p I&D L buttock and perineum 6/10 s/p I&D and diverting colostomy   Patient is currently intubated on ventilator support MV: 10.9 L/min Temp (24hrs), Avg:100.7 F (38.2 C), Min:97.1 F (36.2 C), Max:103.1 F (39.5 C)   Medications reviewed and include: 500 mg vitamin C  BID, ferrous sulfate BID, folic acid daily, SSI, lactulose, MVI liquid, Juven BID, miralax, thiamine, zinc  Precedex LR @ 75 ml/hr Neo @ 40 mcg -- MAP > 75 Labs reviewed: Na 146 (H), Ammonia 41 (H)   I&O:  UOP: 1730 ml Stool: 420 ml via colostomy Pt positive 32.5 L since admission Moderate edema noted per RN assessment  Dry weight unknown; weight up at least 10 kg from initial documented weight  Diet Order:   Diet Order            Diet NPO time specified  Diet effective now                 EDUCATION NEEDS:   Not appropriate for education at this time  Skin:  Skin Assessment:  (necrotizing fasciitis to L buttock) Skin Integrity Issues:: DTI DTI: R ear Stage II: NA Unstageable: NA Incisions: L buttocks (6/8 + 6/10); abdomen (6/10)  Last BM:  420 ml via colostomy  Height:   Ht Readings from Last 1 Encounters:  12/23/19 5' 4"  (1.626 m)    Weight:   Wt Readings from Last 1 Encounters:  12/30/19 107.2 kg    Ideal Body Weight:  54.5 kg  BMI:  Body mass index is 40.57 kg/m.  Estimated Nutritional Needs:   Kcal:  2200-2400  Protein:  180-195 grams  Fluid:  >/= 2 L/day  Lockie Pares., RD, LDN, CNSC See AMiON for contact information

## 2019-12-30 NOTE — Progress Notes (Addendum)
[facility] Progress Note Patient Name: Alyssa Carey DOB: [birth year] MRN: [id number]   Date of Service  12/30/2019  HPI/Events of Note  Patient c/o chest pain - EKG reveals Sinus rhythm with occasional Premature ventricular complexes. Minimal voltage criteria for LVH, may be normal variant ( R in aVL ). Inferior infarct , age undetermined Anterior infarct , age undetermined. ST & T wave abnormality, consider lateral ischemia. Therefore, EKG is not diagnostic. BP = 89/60 on Phenylephrine IV infusion, therefore, can't use B-Blocker. Will check Troponin. If Troponin elevated, will consider Heparin IV infusion.  Howwever, nursing reports bleeding from surgical wound.   eICU Interventions  Plan: 1. Cycle troponin.  2. ASA 81 mg per tube now and Q day.       Intervention Category Major Interventions: Other:  Lysle Dingwall 12/30/2019, 11:08 PM

## 2019-12-30 NOTE — Progress Notes (Signed)
NAME:  Alyssa Carey, MRN:  [id number], DOB:  1970-11-07, LOS: 9 ADMISSION DATE:  12/21/2019, CONSULTATION DATE:  6/8 REFERRING MD:  Kshitiz, CHIEF COMPLAINT:  Confusion   Brief History   49 y/o female with a heavy alcohol history admitted in the setting of confusion and presumed alcohol withdrawal who later developed sepsis from a gas forming wound.    Past Medical History  Alcohol abuse  Significant Hospital Events   6/06 admission 6/08 ICU admission to OR ofr I&D, back intubated and on pressors, 6/09 started tube feeds. Still pressor dependent.kept on vent w/ anticipated return to OR planned  6/10 Back to the OR for significant debridement and also placement of diverting colostomy 6/11 Sedated, low-dose phenylephrine. CCS felt wound looking good.  Sedation changed to precedex / PRN fent  6/12 more awake, on 15/5 pressure support 6/13 family discussion >> DNR, continue medical care  Consults:  General surgery  Procedures:  ETT 6/8 >> Right IJ CVL 6/8 >> Radial aline left 6/8 >>  Significant Diagnostic Tests:   6/6 CT head > NAICP  6/7 U/S abdomen > hepatic steatosis  6/8 CT Pelvis > gas throughout medial subcutaneous soft tissues of left buttock into the left perineum  Micro Data:  6/06 SARS COV 2 > negative 6/08 blood > negative 6/08 Lt buttocks wound > Proteus, E coli (resistant to ampicillin, intermediate to unasyn), VRE 6/13 blood >   Antimicrobials:  Rocephin 6/07 >> Clindamycin 6/08 >> Flagyl 6/08 Vancomycin 6/08 >> 6/11 Linezolid 6/13 >>   Interim history/subjective:  Glucose range 130-180 Tmax 103.1 / WBC 20.9  I/O 1.7L UOP, +2.1L in last 24 hours RN reports pt pending 1 unit PRBC. Pt failed SBT with agitation this am.   Remains on precedex, 40 mcg neo  Objective   Blood pressure 111/73, pulse 98, temperature (!) 97.1 F (36.2 C), temperature source Axillary, resp. rate (!) 27, height 5' 4"  (1.626 m), weight 107.2 kg, last menstrual period  03/23/2019, SpO2 100 %.    Vent Mode: PRVC FiO2 (%):  [30 %] 30 % Set Rate:  [16 bmp] 16 bmp Vt Set:  [440 mL] 440 mL PEEP:  [5 cmH20] 5 cmH20 Pressure Support:  [15 cmH20] 15 cmH20 Plateau Pressure:  [22 cmH20-26 cmH20] 22 cmH20   Intake/Output Summary (Last 24 hours) at 12/30/2019 0759 Last data filed at 12/30/2019 0600 Gross per 24 hour  Intake 4270.13 ml  Output 2150 ml  Net 2120.13 ml   Filed Weights   12/28/19 0500 12/29/19 0422 12/30/19 0500  Weight: 105.6 kg 105.6 kg 107.2 kg    Examination: General: adult female lying in bed on vent in NAD   HEENT: MM pink/moist, ETT, R IJ TLC Neuro: awakens to voice, attempts to mouth words  CV: s1s2 RRR, no m/r/g PULM:  Non-labored, diminished breath sounds  GI: soft, bsx4 active, RLQ colostomy  Extremities: warm/dry, 2+ edema  Skin: no rashes or lesions  Resolved Hospital Problem list   Non gap metabolic acidosis, thrombocytopenia in 2nd to sepsis and alcohol abuse  Assessment & Plan:   Septic shock from necrotizing fasciitis of Lt buttock s/p debridement. Proteus, E coli, VRE in wound culture -continue abx > rocephin, clindamycin, linezolid, D9/x -wean vasopressors for MAP >65 -continue IVF at 20m/hr -further debridement / hydrotherapy per CCS   Acute hypoxic respiratory failure in setting of septic shock. -PRVC 8cc/kg as rest mode -daily SBT / WUA  -follow intermittent CXR  -may need tracheostomy to allow  time for healing / weaning from vent   Acute metabolic encephalopathy 2nd to sepsis and hepatic encephalopathy. -continue lactulose -PAD protocol with RASS Goal of 0 to -1   Hypernatremia  -add free water 200 ml Q6  Hyperglycemia. No hx of DM  -SSI   Elevated LFTs in setting of acute alcoholic hepatitis with fatty liver. Negative for Alpha-1 -follow LFT's  -avoid hepatotoxic agents   Anemia of critical illness and chronic disease. -transfuse 1 unit PRBC 6/15 -follow CBC -add iron 300 mg PT BID     Best practice:  Nutrition: tube feeding SUP: protonix DVT prophylaxis: lovenox Code status: DNR Disposition: ICU  Labs:   CMP Latest Ref Rng & Units 12/30/2019 12/29/2019 12/28/2019  Glucose 70 - 99 mg/dL 186(H) 152(H) 168(H)  BUN 6 - 20 mg/dL 55(H) 49(H) 46(H)  Creatinine 0.44 - 1.00 mg/dL 0.65 0.50 0.55  Sodium 135 - 145 mmol/L 146(H) 144 142  Potassium 3.5 - 5.1 mmol/L 3.8 3.9 3.9  Chloride 98 - 111 mmol/L 117(H) 116(H) 118(H)  CO2 22 - 32 mmol/L 18(L) 20(L) 16(L)  Calcium 8.9 - 10.3 mg/dL 8.0(L) 8.1(L) 7.6(L)  Total Protein 6.5 - 8.1 g/dL 6.0(L) 5.6(L) 5.3(L)  Total Bilirubin 0.3 - 1.2 mg/dL 1.1 1.3(H) 1.4(H)  Alkaline Phos 38 - 126 U/L 184(H) 228(H) 262(H)  AST 15 - 41 U/L 41 42(H) 45(H)  ALT 0 - 44 U/L 23 21 20     CBC Latest Ref Rng & Units 12/30/2019 12/29/2019 12/28/2019  WBC 4.0 - 10.5 K/uL 20.9(H) 21.9(H) 21.2(H)  Hemoglobin 12.0 - 15.0 g/dL 6.8(LL) 7.4(L) 7.6(L)  Hematocrit 36 - 46 % 22.6(L) 23.5(L) 23.4(L)  Platelets 150 - 400 K/uL 259 194 140(L)    ABG    Component Value Date/Time   PHART 7.394 12/27/2019 0355   PCO2ART 28.4 (L) 12/27/2019 0355   PO2ART 128 (H) 12/27/2019 0355   HCO3 16.6 (L) 12/27/2019 0355   TCO2 17 (L) 12/25/2019 1726   ACIDBASEDEF 6.6 (H) 12/27/2019 0355   O2SAT 98.6 12/27/2019 0355    CBG (last 3)  Recent Labs    12/30/19 0008 12/30/19 0330 12/30/19 0753  GLUCAP 141* 163* 130*    Critical care time: 33 minutes   Noe Gens, MSN, NP-C [facility] 12/30/2019, 8:00 AM   Please see [email or web address] for pager details.

## 2019-12-30 NOTE — Progress Notes (Signed)
   12/30/19  Hydrotherapy treatment  1145-1230   Subjective Assessment  Subjective pt on vent, sedated   Patient and Family Stated Goals unable to state/on vent sedated   Date of Onset  (abcess present on admission)  Prior Treatments s/p surgical I & D x2, diverting colostomy  Evaluation and Treatment  Evaluation and Treatment Procedures Explained to Patient/Family Yes (eyes open, does not follow)  Evaluation and Treatment Procedures Patient unable to consent due to mental status  Wound / Incision (Open or Dehisced) 12/27/19 Non-pressure wound Buttocks Left *PT ONLY* open wound L gluteal area  Date First Assessed/Time First Assessed: 12/27/19 1410   Wound Type: Non-pressure wound  Location: Buttocks  Location Orientation: Left  Wound Description (Comments): *PT ONLY* open wound L gluteal area  Present on Admission: (c)   Dressing Type Foam - Lift dressing to assess site every shift;Moist to dry  Dressing Status Clean;Dry  Dressing Change Frequency PRN  Site / Wound Assessment Clean  % Wound base Red or Granulating 15%  % Wound base Yellow/Fibrinous Exudate 85%  Peri-wound Assessment Excoriated;Maceration  Wound Length (cm) 17 cm  Wound Width (cm) 13 cm  Wound Depth (cm) 8 cm  Wound Volume (cm^3) 1768 cm^3  Wound Surface Area (cm^2) 221 cm^2  Tunneling (cm)  (4 cm in base of wound)  Undermining (cm) 4 (7-10:00)  Margins Unattached edges (unapproximated)  Drainage Amount Copious  Treatment Hydrotherapy (Ultrasonic mist)  Hydrotherapy  Pulsed lavage therapy - wound location L gluteal area   Pulsed Lavage with Suction (psi) 12 psi  Pulsed Lavage with Suction - Normal Saline Used  (1500)  Pulsed Lavage Tip Tip with splash shield  Wound Therapy - Assess/Plan/Recommendations  Wound Therapy - Clinical Statement Pt with extensive  open wound L gluteal area d/t necrotizing fasciitis, s/p surgical  I and D x2. . Pururlent drainge from tracking at anal fissure.Patient  awake, nods that  she is not in pain,was premedicated with IV medication  Wound Therapy - Functional Problem List immobility, sedated on vent   Factors Delaying/Impairing Wound Healing Substance abuse;Immobility;Multiple medical problems  Hydrotherapy Plan Debridement;Dressing change;Patient/family education;Pulsatile lavage with suction  Wound Therapy - Frequency 6X / week  Wound Therapy - Current Recommendations PT-awake and moving arms   Wound Therapy - Follow Up Recommendations Skilled nursing facility ([facility])  Wound Plan Pt will benefit from hydrotherapy as part a multi-modal/multi-disciplinary wound care to approach to facilitate healing and decr bioburden.   Wound Therapy Goals - Improve the function of patient's integumentary system by progressing the wound(s) through the phases of wound healing by:  Decrease Necrotic Tissue to 50  Decrease Necrotic Tissue - Progress Progressing toward goal  Increase Granulation Tissue to 50  Improve Drainage Characteristics Mod  Improve Drainage Characteristics - Progress Progressing toward goal  Goals/treatment plan/discharge plan were made with and agreed upon by patient/family No, Patient unable to participate in goals/treatment/discharge plan and family unavailable  Time For Goal Achievement Other (comment) (3 wks)  Wound Therapy - Potential for Goals Fair-guarded  Tresa Endo Taliaferro Pager [phone] Office [phone]

## 2019-12-31 ENCOUNTER — Inpatient Hospital Stay (HOSPITAL_COMMUNITY): Payer: Medicaid Other

## 2019-12-31 LAB — GLUCOSE, CAPILLARY
Glucose-Capillary: 113 mg/dL — ABNORMAL HIGH (ref 70–99)
Glucose-Capillary: 118 mg/dL — ABNORMAL HIGH (ref 70–99)
Glucose-Capillary: 123 mg/dL — ABNORMAL HIGH (ref 70–99)
Glucose-Capillary: 126 mg/dL — ABNORMAL HIGH (ref 70–99)
Glucose-Capillary: 151 mg/dL — ABNORMAL HIGH (ref 70–99)
Glucose-Capillary: 160 mg/dL — ABNORMAL HIGH (ref 70–99)
Glucose-Capillary: 182 mg/dL — ABNORMAL HIGH (ref 70–99)

## 2019-12-31 LAB — COMPREHENSIVE METABOLIC PANEL
ALT: 23 U/L (ref 0–44)
AST: 43 U/L — ABNORMAL HIGH (ref 15–41)
Albumin: 1.4 g/dL — ABNORMAL LOW (ref 3.5–5.0)
Alkaline Phosphatase: 157 U/L — ABNORMAL HIGH (ref 38–126)
Anion gap: 8 (ref 5–15)
BUN: 60 mg/dL — ABNORMAL HIGH (ref 6–20)
CO2: 19 mmol/L — ABNORMAL LOW (ref 22–32)
Calcium: 8.5 mg/dL — ABNORMAL LOW (ref 8.9–10.3)
Chloride: 118 mmol/L — ABNORMAL HIGH (ref 98–111)
Creatinine, Ser: 0.71 mg/dL (ref 0.44–1.00)
GFR calc Af Amer: >60 mL/min (ref >60)
GFR calc non Af Amer: >60 mL/min (ref >60)
Glucose, Bld: 151 mg/dL — ABNORMAL HIGH (ref 70–99)
Potassium: 3.4 mmol/L — ABNORMAL LOW (ref 3.5–5.1)
Sodium: 145 mmol/L (ref 135–145)
Total Bilirubin: 1 mg/dL (ref 0.3–1.2)
Total Protein: 6.3 g/dL — ABNORMAL LOW (ref 6.5–8.1)

## 2019-12-31 LAB — CBC
HCT: 25.3 % — ABNORMAL LOW (ref 36.0–46.0)
Hemoglobin: 7.8 g/dL — ABNORMAL LOW (ref 12.0–15.0)
MCH: 32.4 pg (ref 26.0–34.0)
MCHC: 30.8 g/dL (ref 30.0–36.0)
MCV: 105 fL — ABNORMAL HIGH (ref 80.0–100.0)
Platelets: 325 10*3/uL (ref 150–400)
RBC: 2.41 MIL/uL — ABNORMAL LOW (ref 3.87–5.11)
RDW: 20.7 % — ABNORMAL HIGH (ref 11.5–15.5)
WBC: 19.6 10*3/uL — ABNORMAL HIGH (ref 4.0–10.5)
nRBC: 0.4 % — ABNORMAL HIGH (ref 0.0–0.2)

## 2019-12-31 LAB — TROPONIN I (HIGH SENSITIVITY): Troponin I (High Sensitivity): 9 ng/L (ref <18)

## 2019-12-31 MED ORDER — FUROSEMIDE 10 MG/ML IJ SOLN
40.0000 mg | Freq: Once | INTRAMUSCULAR | Status: AC
  Administered 2019-12-31: 40 mg via INTRAVENOUS
  Filled 2019-12-31: qty 4

## 2019-12-31 MED ORDER — METRONIDAZOLE IN NACL 5-0.79 MG/ML-% IV SOLN
500.0000 mg | Freq: Three times a day (TID) | INTRAVENOUS | Status: DC
  Administered 2019-12-31 – 2020-01-06 (×17): 500 mg via INTRAVENOUS
  Filled 2019-12-31 (×18): qty 100

## 2019-12-31 MED ORDER — ADULT MULTIVITAMIN LIQUID CH
15.0000 mL | Freq: Every day | ORAL | Status: DC
  Administered 2019-12-31 – 2020-01-07 (×8): 15 mL
  Filled 2019-12-31 (×8): qty 15

## 2019-12-31 MED ORDER — DEXMEDETOMIDINE HCL IN NACL 400 MCG/100ML IV SOLN
0.0000 ug/kg/h | INTRAVENOUS | Status: AC
  Administered 2019-12-31 – 2020-01-01 (×5): 1.2 ug/kg/h via INTRAVENOUS
  Administered 2020-01-01: 1.201 ug/kg/h via INTRAVENOUS
  Administered 2020-01-01 – 2020-01-03 (×12): 1.2 ug/kg/h via INTRAVENOUS
  Filled 2019-12-31 (×11): qty 100
  Filled 2019-12-31: qty 200
  Filled 2019-12-31 (×8): qty 100
  Filled 2019-12-31: qty 200
  Filled 2019-12-31: qty 100

## 2019-12-31 MED ORDER — SODIUM CHLORIDE 0.9 % IV SOLN: INTRAVENOUS | Status: DC | PRN

## 2019-12-31 MED ORDER — DEXMEDETOMIDINE HCL IN NACL 200 MCG/50ML IV SOLN
0.0000 ug/kg/h | INTRAVENOUS | Status: DC
  Administered 2019-12-31 (×4): 1.2 ug/kg/h via INTRAVENOUS
  Filled 2019-12-31: qty 100
  Filled 2019-12-31: qty 50

## 2019-12-31 NOTE — Progress Notes (Signed)
NAME:  Alyssa Carey, MRN:  [id number], DOB:  1971/04/27, LOS: 84 ADMISSION DATE:  12/21/2019, CONSULTATION DATE:  6/8 REFERRING MD:  Kshitiz, CHIEF COMPLAINT:  Confusion   Brief History   49 y/o female with a heavy alcohol history admitted in the setting of confusion and presumed alcohol withdrawal who later developed sepsis from a gas forming wound.    Past Medical History  Alcohol abuse  Significant Hospital Events   6/06 admission 6/08 ICU admission to OR ofr I&D, back intubated and on pressors, 6/09 started tube feeds. Still pressor dependent.kept on vent w/ anticipated return to OR planned  6/10 Back to the OR for significant debridement and also placement of diverting colostomy 6/11 Sedated, low-dose phenylephrine. CCS felt wound looking good.  Sedation changed to precedex / PRN fent  6/12 more awake, on 15/5 pressure support 6/13 family discussion >> DNR, continue medical care 6//15 transfuse 1 unit PRBC; vomiting, tube feeds held  Consults:  General surgery  Procedures:  ETT 6/8 >> Right IJ CVL 6/8 >>  Significant Diagnostic Tests:   6/6 CT head > NAICP  6/7 U/S abdomen > hepatic steatosis  6/8 CT Pelvis > gas throughout medial subcutaneous soft tissues of left buttock into the left perineum  Micro Data:  6/06 SARS COV 2 > negative 6/08 blood > negative 6/08 Lt buttocks wound > Proteus, E coli (resistant to ampicillin, intermediate to unasyn), VRE 6/13 blood >   Antimicrobials:  Rocephin 6/07 >> Clindamycin 6/08 >> Flagyl 6/08 Vancomycin 6/08 >> 6/11 Linezolid 6/13 >>   Interim history/subjective:  Had episode of vomiting overnight and tube feeds held.    Objective   Blood pressure (!) 94/30, pulse 96, temperature 99.6 F (37.6 C), temperature source Axillary, resp. rate (!) 26, height 5' 4"  (1.626 m), weight 107.2 kg, last menstrual period 03/23/2019, SpO2 100 %.    Vent Mode: PRVC FiO2 (%):  [30 %] 30 % Set Rate:  [16 bmp] 16 bmp Vt Set:  [440  mL] 440 mL PEEP:  [5 cmH20] 5 cmH20 Plateau Pressure:  [24 cmH20-32 cmH20] 26 cmH20   Intake/Output Summary (Last 24 hours) at 12/31/2019 0751 Last data filed at 12/31/2019 0600 Gross per 24 hour  Intake 4550.85 ml  Output 2810 ml  Net 1740.85 ml   Filed Weights   12/28/19 0500 12/29/19 0422 12/30/19 0500  Weight: 105.6 kg 105.6 kg 107.2 kg    Examination:  General - alert Eyes - pupils reactive ENT - ETT in place Cardiac - regular rate/rhythm, no murmur Chest - decreased BS at bases Abdomen - soft, non tender, decreased bowel sounds, colostomy in LLQ Extremities - 2+ edema Skin - wound dressing dry Neuro - RASS 0  Resolved Hospital Problem list   Non gap metabolic acidosis, thrombocytopenia in 2nd to sepsis and alcohol abuse, hepatic encephalopathy  Assessment & Plan:   Septic shock from necrotizing fasciitis of Lt buttock s/p debridement. - Proteus, E coli, VRE in wound culture - day 10 of ABx > rocephin, clindamycin, linezolid - wean pressors to keep MAP > 65 - surgery to assess wound during hydrotherapy and then determine if additional OR trips needed  Acute hypoxic respiratory failure in setting of septic shock. - wean on pressure support as able - volume overload, deconditioning barriers to extubation trial at this time - has been on vent 8 days; might need trach to assist with vent weaning if she doesn't improve further during the course of this week - lasix  40 mg IV x one on 6/16  Vomiting episode on 6/16. - hold tube feeds for now - prn zofran  Acute metabolic encephalopathy 2nd to sepsis. - RASS goal 0 to -1  Hyperglycemia. - No hx of DM  - SSI  Elevated LFTs in setting of acute alcoholic hepatitis with fatty liver. - Negative for Alpha-1 - f/u LFTs intermittently  Anemia of critical illness and chronic disease. - f/u CBC - transfuse for Hb < 7 or significant bleeding - continue iron supplementation  Best practice:  Nutrition: tube  feeding SUP: protonix DVT prophylaxis: lovenox Code status: DNR Disposition: ICU  Labs:   CMP Latest Ref Rng & Units 12/31/2019 12/30/2019 12/29/2019  Glucose 70 - 99 mg/dL 151(H) 186(H) 152(H)  BUN 6 - 20 mg/dL 60(H) 55(H) 49(H)  Creatinine 0.44 - 1.00 mg/dL 0.71 0.65 0.50  Sodium 135 - 145 mmol/L 145 146(H) 144  Potassium 3.5 - 5.1 mmol/L 3.4(L) 3.8 3.9  Chloride 98 - 111 mmol/L 118(H) 117(H) 116(H)  CO2 22 - 32 mmol/L 19(L) 18(L) 20(L)  Calcium 8.9 - 10.3 mg/dL 8.5(L) 8.0(L) 8.1(L)  Total Protein 6.5 - 8.1 g/dL 6.3(L) 6.0(L) 5.6(L)  Total Bilirubin 0.3 - 1.2 mg/dL 1.0 1.1 1.3(H)  Alkaline Phos 38 - 126 U/L 157(H) 184(H) 228(H)  AST 15 - 41 U/L 43(H) 41 42(H)  ALT 0 - 44 U/L 23 23 21     CBC Latest Ref Rng & Units 12/31/2019 12/30/2019 12/30/2019  WBC 4.0 - 10.5 K/uL 19.6(H) - 20.9(H)  Hemoglobin 12.0 - 15.0 g/dL 7.8(L) 7.8(L) 6.8(LL)  Hematocrit 36 - 46 % 25.3(L) 25.1(L) 22.6(L)  Platelets 150 - 400 K/uL 325 - 259    ABG    Component Value Date/Time   PHART 7.394 12/27/2019 0355   PCO2ART 28.4 (L) 12/27/2019 0355   PO2ART 128 (H) 12/27/2019 0355   HCO3 16.6 (L) 12/27/2019 0355   TCO2 17 (L) 12/25/2019 1726   ACIDBASEDEF 6.6 (H) 12/27/2019 0355   O2SAT 98.6 12/27/2019 0355    CBG (last 3)  Recent Labs    12/31/19 0006 12/31/19 0351 12/31/19 0745  GLUCAP 182* 151* 113*    Critical care time: 34 minutes  Chesley Mires, MD [place] Pager - [phone] 12/31/2019, 8:00 AM

## 2019-12-31 NOTE — Consult Note (Signed)
[facility] Nurse wound follow up Wound type: Pressure injury to right ear Measurement: 1cm x 1.5cm with skin not broken. Purple/red discoloration Wound bed:N/A Drainage (amount, consistency, odor) None Periwound:None Dressing procedure/placement/frequency: silicone foam in place, will continue   [place] ostomy follow up Stoma type/location: LLQ colostomy. Red robinson catheter rod removed today without incident. Stomal assessment/size: Oval, below skin level in deep gulley.  1 and 1/2 inch Peristomal assessment: intact Treatment options for stomal/peristomal skin: skin barrier ring, soft convex pouch (1-piece) Output: mucus Ostomy pouching: 1pc.soft convex with skin barrier ring  Education provided: None Enrolled patient in [employer] Discharge program: No   WOC nursing team will follow, seeing every 7-10 days and will remain available to this patient, the nursing and medical teams.  Thanks, Maudie Flakes, MSN, RN, [place], Arther Abbott  Pager# [phone]

## 2019-12-31 NOTE — Progress Notes (Addendum)
eLink Physician-Brief Progress Note Patient Name: Alyssa Carey DOB: [birth year] MRN: [id number]   Date of Service  12/31/2019  HPI/Events of Note  Patient vomited tube feeds. Nursing concern for aspiration. Tube feeds held. O2 sat currently = 100%.  eICU Interventions  Plan: 1. Hold tube feeds until patient is re-evaluated by rounding team in AM. 2. Portable CXR already ordered for 6 AM. 3. NGT to LIS.       Intervention Category Major Interventions: Other:  Lysle Dingwall 12/31/2019, 1:53 AM

## 2019-12-31 NOTE — Progress Notes (Signed)
[facility] Progress Note Patient Name: Alyssa Carey DOB: 1970-12-13 MRN: [id number]   Date of Service  12/31/2019  HPI/Events of Note  Troponin #1 = 11. This is a normal value. Therefore, doubt acute cardiac injury.   eICU Interventions  Plan: 1. Treat pain symptomatically.  2. Continue current cardiac management.      Intervention Category Major Interventions: Other:  Lysle Dingwall 12/31/2019, 12:10 AM

## 2019-12-31 NOTE — Progress Notes (Signed)
6 Days Post-Op  Subjective: CC: Awake on vent. Seen with hydro.   Objective: Vital signs in last 24 hours: Temp:  [98.7 F (37.1 C)-103.2 F (39.6 C)] 100.9 F (38.3 C) (06/16 0800) Pulse Rate:  [87-130] 87 (06/16 0803) Resp:  [21-39] 24 (06/16 0900) BP: (88-156)/(21-103) 120/74 (06/16 0900) SpO2:  [96 %-100 %] 100 % (06/16 0803) FiO2 (%):  [30 %] 30 % (06/16 0803) Last BM Date: 12/31/19  Intake/Output from previous day: 06/15 0701 - 06/16 0700 In: 4661.9 [I.V.:2688.7; Blood:30; NG/GT:1050.8; IV Piggyback:892.4] Out: 2810 [Urine:1335; Emesis/NG output:1100; Stool:375] Intake/Output this shift: Total I/O In: 121.6 [I.V.:111.6; IV Piggyback:10] Out: 110 [Urine:100; Stool:10]  PE: Skin: Wound measures 13cm x 11cm. The deepest portion of the wound measures 9cm. There is  demarcated area at the superior aspect of the wound with necrotic skin and adipose tissue with tunneling.  Wound is relatively clean.  Edges of wound with necrotic adipose tissue.  There is some tissue along what is likely rectum that finger fractures with tan purulent drainage noted.  See pictures below     Lab Results:  Recent Labs    12/30/19 0336 12/30/19 0336 12/30/19 1350 12/31/19 0530  WBC 20.9*  --   --  19.6*  HGB 6.8*   < > 7.8* 7.8*  HCT 22.6*   < > 25.1* 25.3*  PLT 259  --   --  325   < > = values in this interval not displayed.   BMET Recent Labs    12/30/19 0336 12/31/19 0530  NA 146* 145  K 3.8 3.4*  CL 117* 118*  CO2 18* 19*  GLUCOSE 186* 151*  BUN 55* 60*  CREATININE 0.65 0.71  CALCIUM 8.0* 8.5*   PT/INR No results for input(s): LABPROT, INR in the last 72 hours. CMP     Component Value Date/Time   NA 145 12/31/2019 0530   K 3.4 (L) 12/31/2019 0530   CL 118 (H) 12/31/2019 0530   CO2 19 (L) 12/31/2019 0530   GLUCOSE 151 (H) 12/31/2019 0530   BUN 60 (H) 12/31/2019 0530   CREATININE 0.71 12/31/2019 0530   CALCIUM 8.5 (L) 12/31/2019 0530   PROT 6.3 (L) 12/31/2019  0530   ALBUMIN 1.4 (L) 12/31/2019 0530   AST 43 (H) 12/31/2019 0530   ALT 23 12/31/2019 0530   ALKPHOS 157 (H) 12/31/2019 0530   BILITOT 1.0 12/31/2019 0530   GFRNONAA >60 12/31/2019 0530   GFRAA >60 12/31/2019 0530   Lipase  No results found for: LIPASE     Studies/Results: DG CHEST PORT 1 VIEW  Result Date: 12/31/2019 CLINICAL DATA:  Sepsis. EXAM: PORTABLE CHEST 1 VIEW COMPARISON:  12/30/2019 FINDINGS: ET tube tip is above the carina. Right IJ catheter tip projects over the SVC. NG tube tip is well below the level of the GE junction. Persistent low lung volumes with asymmetric elevation of the right hemidiaphragm. Bibasilar atelectasis noted, right greater than left. No interstitial edema or airspace consolidation. IMPRESSION: Persistent low lung volumes with asymmetric elevation of the right hemidiaphragm and bibasilar atelectasis, right greater than left. Electronically Signed   By: Kerby Moors M.D.   On: 12/31/2019 08:27   DG Chest Port 1 View  Result Date: 12/30/2019 CLINICAL DATA:  Respiratory failure EXAM: PORTABLE CHEST 1 VIEW COMPARISON:  Two days ago FINDINGS: Endotracheal tube with tip at the clavicular heads. The enteric tube reaches the stomach. Right IJ line with tip at the upper cavoatrial junction. Low  volume chest with streaky and hazy opacity on both sides. No Kerley lines, effusion, or pneumothorax. Stable heart size IMPRESSION: 1. Stable hardware positioning. 2. Very low volume chest with extensive atelectasis. Electronically Signed   By: Monte Fantasia M.D.   On: 12/30/2019 05:13    Anti-infectives: Anti-infectives (From admission, onward)   Start     Dose/Rate Route Frequency Ordered Stop   12/28/19 1400  vancomycin (VANCOCIN) IVPB 1000 mg/200 mL premix  Status:  Discontinued        1,000 mg 200 mL/hr over 60 Minutes Intravenous Every 24 hours 12/28/19 1005 12/28/19 1343   12/28/19 1400  linezolid (ZYVOX) IVPB 600 mg     Discontinue     600 mg 300 mL/hr  over 60 Minutes Intravenous Every 12 hours 12/28/19 1343     12/26/19 1948  vancomycin variable dose per unstable renal function (pharmacist dosing)  Status:  Discontinued         Does not apply See admin instructions 12/26/19 1948 12/28/19 1005   12/23/19 2000  vancomycin (VANCOCIN) IVPB 1000 mg/200 mL premix  Status:  Discontinued        1,000 mg 200 mL/hr over 60 Minutes Intravenous Every 8 hours 12/23/19 1027 12/26/19 1944   12/23/19 1300  clindamycin (CLEOCIN) IVPB 900 mg     Discontinue     900 mg 100 mL/hr over 30 Minutes Intravenous Every 8 hours 12/23/19 1205     12/23/19 1200  metroNIDAZOLE (FLAGYL) IVPB 500 mg  Status:  Discontinued        500 mg 100 mL/hr over 60 Minutes Intravenous Every 8 hours 12/23/19 1135 12/23/19 1254   12/23/19 1045  vancomycin (VANCOREADY) IVPB 2000 mg/400 mL        2,000 mg 200 mL/hr over 120 Minutes Intravenous NOW 12/23/19 1018 12/23/19 1441   12/23/19 0300  cefTRIAXone (ROCEPHIN) 2 g in sodium chloride 0.9 % 100 mL IVPB     Discontinue     2 g 200 mL/hr over 30 Minutes Intravenous Every 24 hours 12/23/19 0236         Assessment/Plan Hepatic encephalopathy Alcohol abuse Thrombocytopenia  HTN  Sepsis Necrotizing fasciitis of left buttock -S/Pincision and debridement of skin, subcutaneous tissue, fascia and muscle 15x15x6cm, left buttock5/8 Dr. 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 Redmond Pulling - Appreciate CCM assistance with her care  - Continue IV abx  -Dressing changes TID currently. - Demarcated skin at the superior aspect and lateral aspect of the wound will likely need to be excised.  She also has tan purulent fluid that tracks up the edge of the rectum.  This is concerning as if anything else needed to be debrided from this area she would need a significant surgery including an APR.  Continue to try and preserve  this area as much as we can.  May need to return to OR for another debridement and washout. Will discuss with MD.   FEN: NPO, IVF,ok for TFs ID: Rocephin, Clindamycin, Linezolid  VTE: SCD's Foley: placed 6/8   LOS: 10 days    Alyssa Carey , [facility] 12/31/2019, 9:45 AM Please see Amion for pager number during day hours 7:00am-4:30pm

## 2019-12-31 NOTE — Progress Notes (Signed)
Pt noted to have vomited. Tube feeds stopped, inline vent suction preformed and oral suctioning completed. Tube was hooked up to wall suction and 790m obtained. Elink informed.  OG set up to low int suction per Dr SOletta Darter

## 2019-12-31 NOTE — Progress Notes (Signed)
   12/31/19  Hydrotherapy note  [phone]   Subjective Assessment  Subjective pt on vent,   Patient and Family Stated Goals unable to state/on vent   Date of Onset  (abcess present on admission)  Prior Treatments s/p surgical I & D x2, diverting colostomy  Evaluation and Treatment  Evaluation and Treatment Procedures Explained to Patient/Family Yes (eyes open, holds onto rail.  Evaluation and Treatment Procedures Patient unable to consent due to mental status  Wound / Incision (Open or Dehisced) 12/27/19 Non-pressure wound Buttocks Left *PT ONLY* open wound L gluteal area  Date First Assessed/Time First Assessed: 12/27/19 1410   Wound Type: Non-pressure wound  Location: Buttocks  Location Orientation: Left  Wound Description (Comments): *PT ONLY* open wound L gluteal area  Present on Admission: (c)   Dressing Type ABD;Gauze (Comment);Foam - Lift dressing to assess site every shift  Dressing Changed Changed  Dressing Status Clean  Dressing Change Frequency Twice a day  Site / Wound Assessment Clean;Dry  % Wound base Red or Granulating 15%  % Wound base Yellow/Fibrinous Exudate 70%  % Wound base Black/Eschar 15%  Peri-wound Assessment Excoriated  Wound Length (cm) 17 cm  Wound Width (cm) 13 cm  Wound Depth (cm) 8 cm  Wound Volume (cm^3) 1768 cm^3  Wound Surface Area (cm^2) 221 cm^2  Undermining (cm) 5 (at 600 to 800)  Margins Unattached edges (unapproximated)  Drainage Amount Copious  Drainage Description Serous;No odor (bloody)  Hydrotherapy  Pulsed lavage therapy - wound location L gluteal area   Pulsed Lavage with Suction (psi) 12 psi  Pulsed Lavage with Suction - Normal Saline Used 1000 mL (1500)  Pulsed Lavage Tip Tip with splash shield  Wound Therapy - Assess/Plan/Recommendations  Wound Therapy - Clinical Statement Pt with extensive  open wound L gluteal area d/t necrotizing fasciitis, s/p surgical  I and D x2. . Purulent drainge from tracking at anal fissure.Patient   awake, nods that she is not in pain,was premedicated with IV medication. Surgical PA  in to inspect wound. Recommends continue PLS.  Wound Therapy - Functional Problem List Immobility, follows with eys, mouthing words around tube, reaching arms up, on vent   Factors Delaying/Impairing Wound Healing Substance abuse;Immobility;Multiple medical problems  Hydrotherapy Plan Debridement;Dressing change;Patient/family education;Pulsatile lavage with suction  Wound Therapy - Frequency 6X / week  Wound Therapy - Current Recommendations PT (when less sedated)  Wound Therapy - Follow Up Recommendations Skilled nursing facility (LTAC)  Wound Plan Pt will benefit from hydrotherapy as part a multi-modal/multi-disciplinary wound care to approach to facilitate healing and decr bioburden.   Wound Therapy Goals - Improve the function of patient's integumentary system by progressing the wound(s) through the phases of wound healing by:  Decrease Necrotic Tissue to 50  Decrease Necrotic Tissue - Progress Progressing toward goal  Increase Granulation Tissue to 50  Increase Granulation Tissue - Progress Progressing toward goal  Improve Drainage Characteristics Mod  Improve Drainage Characteristics - Progress Progressing toward goal  Goals/treatment plan/discharge plan were made with and agreed upon by patient/family No, Patient unable to participate in goals/treatment/discharge plan and family unavailable  Time For Goal Achievement Other (comment) (3 wks)  Wound Therapy - Potential for Goals Fair  [place] Pager [phone] Office [phone]

## 2019-12-31 NOTE — Progress Notes (Signed)
12 lead completed d/t arrhythmia noted on monitor. EKG showed critical result of stemi. Charge RN notified and Elink called. Troponin drawn and sent to lab, results WNL, Elink aware. Asprin given per order. Pt points to chest and when asked if it hurts she nods her head yes, elink aware. Fentanyl given per order.  Phenylephrine titrated per blood pressure, see MAR

## 2019-12-31 NOTE — Progress Notes (Signed)
Nutrition Follow-up  DOCUMENTATION CODES:   Obesity unspecified  INTERVENTION:  - will monitor for ability to re-start TF.   NUTRITION DIAGNOSIS:   Increased nutrient needs related to chronic illness, wound healing (alcohol abuse; necrotizing fasciitis) as evidenced by estimated needs. -ongoing  GOAL:   Patient will meet greater than or equal to 90% of their needs -unable to meet at this time.   MONITOR:   Vent status, Labs, Weight trends, Skin, Other (Comment) (plan concerning nutrition support)  ASSESSMENT:   Pt with PMH of heavy alcohol history who for one week with decreased intake admitted 6/6 with alcoholic hepatitis with hepatic encephalopathy and AKI.  Significant Events: 6/7- admission; initial RD assessment 6/8- Rapid Response; CT pelvis to r/o necrotizing fascitis--found to be positive for necrotizing fascitis and went to OR for I&D 6/9- remained intubated with OGT in place; started TF; added ascorbic acid BID and zinc once/day.  6/10- return to OR for I&D; lap loop colostomy   Patient discussed in rounds. Also able to talk with RN. Patient had an aspiration event overnight when RN noted bilious liquid coming from patient's mouth. TF was stopped and OGT placed to LIS with immediate 700 ml output and additional 400 ml since that time. TF remains on hold and RN holding all meds per tube.   Patient remains intubated. Weight continues to trend up and patient is +10.2 kg/23 lb since admission on 6/8.    Per Surgery team note this AM: wound is 13 cm x 11 cm and the deepest portion of wound is 9 cm. Noted necrotic skin and adipose tissue with tunneling present. Possible need to return to the OR for another debridement and washout.      Patient is currently intubated on ventilator support MV: 9.6 L/min Temp (24hrs), Avg:100.8 F (38.2 C), Min:98.7 F (37.1 C), Max:103.2 F (39.6 C) Propofol: none BP: 132/76 and MAP: 91  Labs reviewed; CBGs: 182, 151, 113, 118 mg/dl,  K: 3.4 mmol/l, Cl: 118 mmol/l, BUN: 60 mg/dl, Ca: 8.5 mg/dl, alk phos elevated. Medications reviewed; 500 mg ascorbic acid BID, 300 mg ferrous sulfate BID, 1 mg folvite/day, 40 mg IV lasix x1 dose 6/16, sliding scale novolog, 1 tablet multivitamin minerals/day, 40 mg protonix per OGT BID, 17 g miralax/day, 100 mg thiamine/day, 220 mg zinc sulfate/day.  Drips; precedex @ 1.2 mcg/kg/hr, neo @ 50 mcg/min.   Diet Order:   Diet Order            Diet NPO time specified  Diet effective now                 EDUCATION NEEDS:   Not appropriate for education at this time  Skin:  Skin Assessment:  (necrotizing fasciitis to L buttock) Skin Integrity Issues:: DTI DTI: R ear Stage II: NA Unstageable: NA Incisions: L buttocks (6/8 + 6/10); abdomen (6/10)  Last BM:  6/16--160 ml via colostomy  Height:   Ht Readings from Last 1 Encounters:  12/23/19 5' 4"  (1.626 m)    Weight:   Wt Readings from Last 1 Encounters:  12/30/19 107.2 kg     Estimated Nutritional Needs:  Kcal:  2200-2400 Protein:  180-195 grams Fluid:  >/= 2 L/day     Jarome Matin, MS, RD, LDN, CNSC Inpatient Clinical Dietitian RD pager # available in AMION  After hours/weekend pager # available in [facility]

## 2020-01-01 ENCOUNTER — Inpatient Hospital Stay (HOSPITAL_COMMUNITY): Payer: Medicaid Other

## 2020-01-01 ENCOUNTER — Inpatient Hospital Stay: Payer: Self-pay

## 2020-01-01 LAB — CBC
HCT: 24 % — ABNORMAL LOW (ref 36.0–46.0)
Hemoglobin: 7.1 g/dL — ABNORMAL LOW (ref 12.0–15.0)
MCH: 32 pg (ref 26.0–34.0)
MCHC: 29.6 g/dL — ABNORMAL LOW (ref 30.0–36.0)
MCV: 108.1 fL — ABNORMAL HIGH (ref 80.0–100.0)
Platelets: 394 10*3/uL (ref 150–400)
RBC: 2.22 MIL/uL — ABNORMAL LOW (ref 3.87–5.11)
RDW: 20.9 % — ABNORMAL HIGH (ref 11.5–15.5)
WBC: 16.8 10*3/uL — ABNORMAL HIGH (ref 4.0–10.5)
nRBC: 0.3 % — ABNORMAL HIGH (ref 0.0–0.2)

## 2020-01-01 LAB — GLUCOSE, CAPILLARY
Glucose-Capillary: 107 mg/dL — ABNORMAL HIGH (ref 70–99)
Glucose-Capillary: 114 mg/dL — ABNORMAL HIGH (ref 70–99)
Glucose-Capillary: 118 mg/dL — ABNORMAL HIGH (ref 70–99)
Glucose-Capillary: 129 mg/dL — ABNORMAL HIGH (ref 70–99)
Glucose-Capillary: 141 mg/dL — ABNORMAL HIGH (ref 70–99)
Glucose-Capillary: 145 mg/dL — ABNORMAL HIGH (ref 70–99)

## 2020-01-01 LAB — BASIC METABOLIC PANEL
Anion gap: 9 (ref 5–15)
Anion gap: 10 (ref 5–15)
BUN: 65 mg/dL — ABNORMAL HIGH (ref 6–20)
BUN: 58 mg/dL — ABNORMAL HIGH (ref 6–20)
CO2: 21 mmol/L — ABNORMAL LOW (ref 22–32)
CO2: 20 mmol/L — ABNORMAL LOW (ref 22–32)
Calcium: 8.4 mg/dL — ABNORMAL LOW (ref 8.9–10.3)
Calcium: 8.4 mg/dL — ABNORMAL LOW (ref 8.9–10.3)
Chloride: 116 mmol/L — ABNORMAL HIGH (ref 98–111)
Chloride: 120 mmol/L — ABNORMAL HIGH (ref 98–111)
Creatinine, Ser: 0.59 mg/dL (ref 0.44–1.00)
Creatinine, Ser: 0.62 mg/dL (ref 0.44–1.00)
GFR calc Af Amer: >60 mL/min (ref >60)
GFR calc Af Amer: >60 mL/min (ref >60)
GFR calc non Af Amer: >60 mL/min (ref >60)
GFR calc non Af Amer: >60 mL/min (ref >60)
Glucose, Bld: 197 mg/dL — ABNORMAL HIGH (ref 70–99)
Glucose, Bld: 123 mg/dL — ABNORMAL HIGH (ref 70–99)
Potassium: 3 mmol/L — ABNORMAL LOW (ref 3.5–5.1)
Potassium: 4.4 mmol/L (ref 3.5–5.1)
Sodium: 146 mmol/L — ABNORMAL HIGH (ref 135–145)
Sodium: 150 mmol/L — ABNORMAL HIGH (ref 135–145)

## 2020-01-01 MED ORDER — FUROSEMIDE 10 MG/ML IJ SOLN
40.0000 mg | Freq: Two times a day (BID) | INTRAMUSCULAR | Status: AC
  Administered 2020-01-01 (×2): 40 mg via INTRAVENOUS
  Filled 2020-01-01 (×2): qty 4

## 2020-01-01 MED ORDER — NYSTATIN 100000 UNIT/GM EX POWD
Freq: Three times a day (TID) | CUTANEOUS | Status: AC
  Filled 2020-01-01: qty 15

## 2020-01-01 MED ORDER — VITAMIN D (ERGOCALCIFEROL) 1.25 MG (50000 UNIT) PO CAPS
50000.0000 [IU] | ORAL_CAPSULE | ORAL | Status: DC
  Administered 2020-01-01: 50000 [IU]
  Filled 2020-01-01 (×2): qty 1

## 2020-01-01 MED ORDER — POTASSIUM CHLORIDE 20 MEQ/15ML (10%) PO SOLN
40.0000 meq | Freq: Once | ORAL | Status: AC
  Administered 2020-01-01: 40 meq
  Filled 2020-01-01: qty 30

## 2020-01-01 MED ORDER — POTASSIUM CHLORIDE 20 MEQ/15ML (10%) PO SOLN: 20.0000 meq | ORAL | Status: DC

## 2020-01-01 MED ORDER — METOCLOPRAMIDE HCL 5 MG/ML IJ SOLN
5.0000 mg | Freq: Three times a day (TID) | INTRAMUSCULAR | Status: AC
  Administered 2020-01-01 (×2): 5 mg via INTRAVENOUS
  Filled 2020-01-01 (×2): qty 2

## 2020-01-01 MED ORDER — POTASSIUM CHLORIDE 10 MEQ/50ML IV SOLN
10.0000 meq | INTRAVENOUS | Status: AC
  Administered 2020-01-01 (×6): 10 meq via INTRAVENOUS
  Filled 2020-01-01 (×6): qty 50

## 2020-01-01 NOTE — Progress Notes (Signed)
7 Days Post-Op    CC: Fall and weakness  Subjective: Patient remains sedated on the vent.  She pulled out her NG last evening and most likely had an aspiration event.  The NG has been replaced and she is draining a brown almost feculent appearing fluid from her stomach.  There is an Eakin's pouch over one of the ports sites now is draining a clear serous fluid.  The colostomy is working well.  We did not look at the buttocks wound today.  Objective: Vital signs in last 24 hours: Temp:  [97.6 F (36.4 C)-102.3 F (39.1 C)] 100.5 F (38.1 C) (06/17 0352) Pulse Rate:  [70-92] 72 (06/17 0700) Resp:  [19-30] 24 (06/17 0700) BP: (81-131)/(33-78) 99/57 (06/17 0700) SpO2:  [99 %-100 %] 99 % (06/17 0700) FiO2 (%):  [30 %] 30 % (06/17 0318) Last BM Date: 12/31/19 2324 IV 1550 urine 700.  NG 120 stool T-max 102.3 respiratory rate in the 20s.  Heart rate 70s - 94 BP 89-100 range. . sodium chloride    . cefTRIAXone (ROCEPHIN)  IV Stopped (12/31/19 2139)  . dexmedetomidine (PRECEDEX) IV infusion 1.2 mcg/kg/hr (01/01/20 0800)  . feeding supplement (PIVOT 1.5 CAL) 1,000 mL (12/31/19 1453)  . linezolid (ZYVOX) IV Stopped (01/01/20 0321)  . metronidazole 100 mL/hr at 01/01/20 0800  . phenylephrine (NEO-SYNEPHRINE) Adult infusion 50 mcg/min (01/01/20 0800)  . potassium chloride 10 mEq (01/01/20 0806)  Still on neo- K+ 3.0 Glucose 197 BUN 65/creatinine 0.59 WBC improving/ongoing anemia stable CBC Latest Ref Rng & Units 01/01/2020 12/31/2019 12/30/2019  WBC 4.0 - 10.5 K/uL 16.8(H) 19.6(H) -  Hemoglobin 12.0 - 15.0 g/dL 7.1(L) 7.8(L) 7.8(L)  Hematocrit 36 - 46 % 24.0(L) 25.3(L) 25.1(L)  Platelets 150 - 400 K/uL 394 325 -   Intake/Output from previous day: 06/16 0701 - 06/17 0700 In: 2324.5 [I.V.:818.7; NG/GT:605.8; IV Piggyback:900] Out: 2370 [Urine:1550; Emesis/NG output:700; Stool:120] Intake/Output this shift: Total I/O In: 301.8 [I.V.:193.9; IV Piggyback:107.8] Out: -   General  appearance: She remains sedated on the vent.  She does look around some. Resp: Clear anterior. Cardio: Regular rate and rhythm GI: Large abdomen, port sites dressed, 1 has a new Eakin's like pouch over it draining serous fluid, colostomy is working.  Lab Results:  Recent Labs    12/31/19 0530 01/01/20 0403  WBC 19.6* 16.8*  HGB 7.8* 7.1*  HCT 25.3* 24.0*  PLT 325 394    BMET Recent Labs    12/31/19 0530 01/01/20 0403  NA 145 146*  K 3.4* 3.0*  CL 118* 116*  CO2 19* 21*  GLUCOSE 151* 197*  BUN 60* 65*  CREATININE 0.71 0.59  CALCIUM 8.5* 8.4*   PT/INR No results for input(s): LABPROT, INR in the last 72 hours.  Recent Labs  Lab 12/27/19 0415 12/28/19 0435 12/29/19 0433 12/30/19 0336 12/31/19 0530  AST 32 45* 42* 41 43*  ALT 21 20 21 23 23   ALKPHOS 250* 262* 228* 184* 157*  BILITOT 2.2* 1.4* 1.3* 1.1 1.0  PROT 5.2* 5.3* 5.6* 6.0* 6.3*  ALBUMIN 1.9* 1.5* 1.4* 1.3* 1.4*     Lipase  No results found for: LIPASE   Medications: . ascorbic acid  500 mg Per Tube BID  . chlorhexidine gluconate (MEDLINE KIT)  15 mL Mouth Rinse BID  . Chlorhexidine Gluconate Cloth  6 each Topical Daily  . feeding supplement (PRO-STAT SUGAR FREE 64)  60 mL Per Tube TID  . ferrous sulfate  300 mg Per Tube BID  .  folic acid  1 mg Per Tube Daily  . free water  200 mL Per Tube Q6H  . insulin aspart  0-6 Units Subcutaneous Q4H  . mouth rinse  15 mL Mouth Rinse 10 times per day  . multivitamin  15 mL Per Tube Daily  . nutrition supplement (JUVEN)  1 packet Per Tube BID BM  . pantoprazole sodium  40 mg Per Tube BID  . polyethylene glycol  17 g Oral Daily  . sodium chloride flush  10-40 mL Intracatheter Q12H  . thiamine  100 mg Per Tube Daily  . zinc sulfate  220 mg Per Tube Daily    Assessment/Plan Hepatic encephalopathy Alcohol abuse Thrombocytopenia  HTN  Sepsis Necrotizing fasciitis of left buttock -S/Pincision and debridement of skin, subcutaneous tissue, fascia and  muscle 15x15x6cm, left buttock5/8 Dr. 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 Redmond Pulling  POD#7 - Appreciate CCM assistance with her care  - Continue IV abx  -Dressing changes TID currently. - Demarcated skin at the superior aspect and lateral aspect of the wound will likely need to be excised. She also has tan purulent fluid that tracks up the edge of the rectum. This is concerning as if anything else needed to be debrided from this area she would need a significant surgery including an APR. Continue to try and preserve this area as much as we can.  FEN: NPO, IVF,ok for TFs ID: Rocephin, Clindamycin, Linezolid  VTE: SCD's Foley: placed 6/8  Plan: Continue current treatment.  We will look at the buttocks wound again tomorrow with hydrotherapy.  LOS: 11 days    Jamyla Ard 01/01/2020 Please see Amion

## 2020-01-01 NOTE — Progress Notes (Signed)
Physical Therapy Hydrotherapy Note  RN present and assisted with monitoring lines and vent.  Pt did not tolerate as well today with more difficulty breathing while redressing wound (so rushed outer dressing part to get pt repositioned safely).   01/01/20 1300  Subjective Assessment  Subjective pt on vent  Patient and Family Stated Goals unable to state/on vent sedated   Date of Onset  (abscess present on admission)  Prior Treatments s/p surgical I & D x2, diverting colostomy  Evaluation and Treatment  Evaluation and Treatment Procedures Explained to Patient/Family Yes (eyes open, does not follow)  Evaluation and Treatment Procedures Patient unable to consent due to mental status  Wound / Incision (Open or Dehisced) 12/27/19 Non-pressure wound Buttocks Left *PT ONLY* open wound L gluteal area  Date First Assessed/Time First Assessed: 12/27/19 1410   Wound Type: Non-pressure wound  Location: Buttocks  Location Orientation: Left  Wound Description (Comments): *PT ONLY* open wound L gluteal area  Present on Admission: (c)   Dressing Type ABD;Gauze (Comment);Moist to moist;Barrier Film (skin prep)  Dressing Changed Changed  Dressing Status Clean  Dressing Change Frequency Twice a day  Site / Wound Assessment Pink;Red;Bleeding;Brown  % Wound base Red or Granulating 15%  % Wound base Yellow/Fibrinous Exudate 70%  % Wound base Black/Eschar 15%  Peri-wound Assessment Excoriated  Margins Unattached edges (unapproximated)  Closure None  Drainage Amount Copious  Drainage Description Serosanguineous  Treatment Hydrotherapy (Pulse lavage);Packing (Saline gauze)  Hydrotherapy  Pulsed lavage therapy - wound location L gluteal area   Pulsed Lavage with Suction (psi) 12 psi  Pulsed Lavage with Suction - Normal Saline Used 1000 mL  Pulsed Lavage Tip Tip with splash shield  Wound Therapy - Assess/Plan/Recommendations  Wound Therapy - Clinical Statement Pt with extensive open wound L gluteal area d/t  necrotizing fasciitis, s/p surgical  I and D x2. Necrotic adipose caudal wound per PA 6/16.  Plan is for possible CT to further investigate area/depth of wound and consider further surgery.  Wound Therapy - Functional Problem List immobility, sedated on vent   Factors Delaying/Impairing Wound Healing Substance abuse;Immobility;Multiple medical problems  Hydrotherapy Plan Debridement;Dressing change;Patient/family education;Pulsatile lavage with suction  Wound Therapy - Frequency 6X / week  Wound Therapy - Current Recommendations PT (when less sedated)  Wound Therapy - Follow Up Recommendations Skilled nursing facility (LTAC)  Wound Plan Pt will benefit from hydrotherapy as part a multi-modal/multi-disciplinary wound care to approach to facilitate healing and decr bioburden.   Wound Therapy Goals - Improve the function of patient's integumentary system by progressing the wound(s) through the phases of wound healing by:  Decrease Necrotic Tissue to 50  Decrease Necrotic Tissue - Progress Progressing toward goal  Increase Granulation Tissue to 50  Increase Granulation Tissue - Progress Progressing toward goal  Decrease Length/Width/Depth by (cm) -/-/3  Improve Drainage Characteristics Mod  Improve Drainage Characteristics - Progress Progressing toward goal  Goals/treatment plan/discharge plan were made with and agreed upon by patient/family No, Patient unable to participate in goals/treatment/discharge plan and family unavailable  Time For Goal Achievement Other (comment) (3 wks)  Wound Therapy - Potential for Goals Fair   Time: [place], DPT Acute Rehabilitation Services Pager: [phone] Office: [phone]

## 2020-01-01 NOTE — Progress Notes (Signed)
eLink Physician-Brief Progress Note Patient Name: ULAH OLMO DOB: January 28, 1971 MRN: [id number]   Date of Service  01/01/2020  HPI/Events of Note  Weeping and macerated areas of skin fold affecting the groin and thigh, presumed to be due to a fungal skin infection.  eICU Interventions  Nystatin powder applied to affected areas tid PRN x 5 days.        Kerry Kass [place] Mallozzi 01/01/2020, 11:02 PM

## 2020-01-01 NOTE — TOC Progression Note (Addendum)
Transition of Care [facility]) - Progression Note    Patient Details  Name: Alyssa Carey MRN: [id number] Date of Birth: 12/22/1970  [facility] CM/SW Contact  Leeroy Cha, RN Phone Number: 01/01/2020, 8:41 AM  Clinical Narrative:    Remains on the vent full support. POD 7 s/p colostomy formation, ng tube present, hgb 7.1 s/p 1 unit of prbc's temp 102.3. Plan follow for snf placement versus home with hhc. Financial planner Waymon Budge notified oif no insurance and need to contact family.  Expected Discharge Plan: Skilled Nursing Facility Barriers to Discharge: Continued Medical Work up  Expected Discharge Plan and Services Expected Discharge Plan: [place]                                               Social Determinants of Health (SDOH) Interventions    Readmission Risk Interventions No flowsheet data found.

## 2020-01-01 NOTE — Progress Notes (Signed)
[place] Progress Note Patient Name: Alyssa Carey DOB: 1970/07/30 MRN: [id number]   Date of Service  01/01/2020  HPI/Events of Note  Nursing states that patient is aspirating tube feeds.   eICU Interventions  Plan: 1. Hold tube feeds.  2. NGT/OGT to LIS.      Intervention Category Major Interventions: Other:  Lysle Dingwall 01/01/2020, 6:14 AM

## 2020-01-01 NOTE — Progress Notes (Signed)
KUB done, NG located in proximal esophagus, will remove and place PANDA per verbal order from MD SOOD. Will get KUB to confirm panda placement for meds only, will hold TF for 01/01/20.

## 2020-01-01 NOTE — Progress Notes (Addendum)
NAME:  Alyssa Carey, MRN:  [id number], DOB:  [birth year], LOS: 86 ADMISSION DATE:  12/21/2019, CONSULTATION DATE:  6/8 REFERRING MD:  Kshitiz, CHIEF COMPLAINT:  Confusion   Brief History   49 y/o female with a heavy alcohol history admitted in the setting of confusion and presumed alcohol withdrawal who later developed sepsis from a gas forming wound.    Past Medical History  Alcohol abuse  Significant Hospital Events   6/06 admission 6/08 ICU admission to OR ofr I&D, back intubated and on pressors, 6/09 started tube feeds. Still pressor dependent.kept on vent w/ anticipated return to OR planned  6/10 Back to the OR for significant debridement and also placement of diverting colostomy 6/11 Sedated, low-dose phenylephrine. CCS felt wound looking good.  Sedation changed to precedex / PRN fent  6/12 more awake, on 15/5 pressure support 6/13 family discussion >> DNR, continue medical care 6//15 transfuse 1 unit PRBC; vomiting, tube feeds held 6/16 Vomiting overnight 6/17 Trial PSV 15/5 with variable Vt, remains on vasopressors.  Vomiting overnight   Consults:  General surgery  Procedures:  ETT 6/8 >> Right IJ CVL 6/8 >>  Significant Diagnostic Tests:   6/6 CT head > NAICP  6/7 U/S abdomen > hepatic steatosis  6/8 CT Pelvis > gas throughout medial subcutaneous soft tissues of left buttock into the left perineum  Micro Data:  6/06 SARS COV 2 > negative 6/08 blood > negative 6/08 Lt buttocks wound > Proteus, E coli (resistant to ampicillin, intermediate to unasyn), VRE 6/13 blood >   Antimicrobials:  Rocephin 6/07 >> Clindamycin 6/08 >> Flagyl 6/08 Vancomycin 6/08 >> 6/11 Linezolid 6/13 >>   Interim history/subjective:  Vomiting overnight, TF held. Pt pulled NGT > noted to be in esophagus Tmax 102.3 / WBC 16.8  I/O 1.5L UOP, -45 ml for last 24 hours   Objective   Blood pressure (!) 99/57, pulse 72, temperature 100.2 F (37.9 C), temperature source Axillary, resp.  rate (!) 24, height 5' 4"  (1.626 m), weight 108.2 kg, last menstrual period 03/23/2019, SpO2 99 %.    Vent Mode: PRVC FiO2 (%):  [30 %] 30 % Set Rate:  [16 bmp] 16 bmp Vt Set:  [440 mL] 440 mL PEEP:  [5 cmH20] 5 cmH20 Plateau Pressure:  [17 cmH20-29 cmH20] 28 cmH20   Intake/Output Summary (Last 24 hours) at 01/01/2020 0835 Last data filed at 01/01/2020 0800 Gross per 24 hour  Intake 2504.72 ml  Output 2560 ml  Net -55.28 ml   Filed Weights   12/29/19 0422 12/30/19 0500 01/01/20 0800  Weight: 105.6 kg 107.2 kg 108.2 kg    Examination: General: adult female lying in bed on vent in NAD HEENT: MM pink/moist, ETT Neuro: Awake, alert, nods / follows commands  CV: s1s2 RRR, no m/r/g PULM: non-labored on vent, lungs bilaterally with scattered rhonchi  GI: soft, bsx4 active, Colostomy LLQ, RLQ drainage bag with yellow clear fluid  Extremities: warm/dry, 1-2+ generalized edema  Skin: no rashes or lesions, posterior surgical dressing intact  Resolved Hospital Problem list   Non gap metabolic acidosis, thrombocytopenia in 2nd to sepsis and alcohol abuse, hepatic encephalopathy  Assessment & Plan:   Septic shock from necrotizing fasciitis of Lt buttock s/p debridement. - Proteus, E coli, VRE in wound culture - D11 ABX, Rocephin, Linezolid, Flagyl  - wean vasopressors for MAP >65 - CCS note reflects they may need CT pelvis to assess for tracking fluid collections prior to repeat surgery   Acute hypoxic  respiratory failure in setting of septic shock. - PRVC 8cc/kg as rest mode - Daily SBT as tolerated  - Volume overload barrier to extubation  - Vent day 9, may need trach if she does not improve  - repeat lasix 40 mg BID x2 doses with KCL   Vomiting on 6/16, 6/17. - hold TF, ok for medication PT - no evidence of bowel obstruction on KUB, reglan 57m IV Q8 x2 doses  - PRN zofran   Acute metabolic encephalopathy 2nd to sepsis. - RASS Goal 0 to -1   Hypokalemia  - follow  electrolytes, replace as indicated  - repeat BMP 2000  Hyperglycemia. - No hx DM  - SSI   Elevated LFTs in setting of acute alcoholic hepatitis with fatty liver. - Negative for Alpha-1 - follow intermittent LFT's   Anemia of critical illness and chronic disease. - trend CBC - transfuse for Hgb <7% or active bleeding  - continue iron supplementation   Best practice:  Nutrition: tube feeding SUP: protonix DVT prophylaxis: lovenox Code status: DNR Disposition: ICU  Labs:   CMP Latest Ref Rng & Units 01/01/2020 12/31/2019 12/30/2019  Glucose 70 - 99 mg/dL 197(H) 151(H) 186(H)  BUN 6 - 20 mg/dL 65(H) 60(H) 55(H)  Creatinine 0.44 - 1.00 mg/dL 0.59 0.71 0.65  Sodium 135 - 145 mmol/L 146(H) 145 146(H)  Potassium 3.5 - 5.1 mmol/L 3.0(L) 3.4(L) 3.8  Chloride 98 - 111 mmol/L 116(H) 118(H) 117(H)  CO2 22 - 32 mmol/L 21(L) 19(L) 18(L)  Calcium 8.9 - 10.3 mg/dL 8.4(L) 8.5(L) 8.0(L)  Total Protein 6.5 - 8.1 g/dL - 6.3(L) 6.0(L)  Total Bilirubin 0.3 - 1.2 mg/dL - 1.0 1.1  Alkaline Phos 38 - 126 U/L - 157(H) 184(H)  AST 15 - 41 U/L - 43(H) 41  ALT 0 - 44 U/L - 23 23    CBC Latest Ref Rng & Units 01/01/2020 12/31/2019 12/30/2019  WBC 4.0 - 10.5 K/uL 16.8(H) 19.6(H) -  Hemoglobin 12.0 - 15.0 g/dL 7.1(L) 7.8(L) 7.8(L)  Hematocrit 36 - 46 % 24.0(L) 25.3(L) 25.1(L)  Platelets 150 - 400 K/uL 394 325 -    ABG    Component Value Date/Time   PHART 7.394 12/27/2019 0355   PCO2ART 28.4 (L) 12/27/2019 0355   PO2ART 128 (H) 12/27/2019 0355   HCO3 16.6 (L) 12/27/2019 0355   TCO2 17 (L) 12/25/2019 1726   ACIDBASEDEF 6.6 (H) 12/27/2019 0355   O2SAT 98.6 12/27/2019 0355    CBG (last 3)  Recent Labs    12/31/19 2316 01/01/20 0617 01/01/20 0812  GLUCAP 160* 145* 118*    Critical care time: 32  minutes   BNoe Gens MSN, NP-C [facility] 01/01/2020, 8:35 AM   Please see [email or web address] for pager details.

## 2020-01-01 NOTE — Progress Notes (Addendum)
NUTRITION NOTE  Patient seen for full follow-up assessment by this RD yesterday (note at 1119). Patient remains intubated at this time. Notes reviewed.  Able to talk with RN. She reports that TF was restarted via OGT yesterday (6/16) at 1230/1300. During night shift, around 0630, patient pulled OGT part way out and tube placed to LIS with 500 ml output immediately. KUB obtained and showed tip of OGT in esophagus so tube removed.  Plan for today is for Panda/small bore NGT placement and to resume medications through tube but to continue to hold TF for the remainder of today. Plan also for PICC placement today.   Vitamin D lab checked and resulted in 16.41 ng/ml. Able to talk with Pharmacist about this and recommendation was made for 50000 units cholecalciferol soft gel capsule/week x2 months and then recheck vitamin D level. Confirmed that liquid from soft gel able to be administered without issue via NGT.   Vitamin A lab was also ordered but has not yet resulted.    Jarome Matin, MS, RD, LDN, CNSC Inpatient Clinical Dietitian RD pager # available in [place]  After hours/weekend pager # available in [facility]

## 2020-01-02 ENCOUNTER — Inpatient Hospital Stay (HOSPITAL_COMMUNITY): Payer: Medicaid Other

## 2020-01-02 LAB — GLUCOSE, CAPILLARY
Glucose-Capillary: 117 mg/dL — ABNORMAL HIGH (ref 70–99)
Glucose-Capillary: 114 mg/dL — ABNORMAL HIGH (ref 70–99)
Glucose-Capillary: 133 mg/dL — ABNORMAL HIGH (ref 70–99)
Glucose-Capillary: 131 mg/dL — ABNORMAL HIGH (ref 70–99)
Glucose-Capillary: 115 mg/dL — ABNORMAL HIGH (ref 70–99)
Glucose-Capillary: 113 mg/dL — ABNORMAL HIGH (ref 70–99)

## 2020-01-02 LAB — CBC
HCT: 23.4 % — ABNORMAL LOW (ref 36.0–46.0)
Hemoglobin: 6.8 g/dL — CL (ref 12.0–15.0)
MCH: 32.4 pg (ref 26.0–34.0)
MCHC: 29.1 g/dL — ABNORMAL LOW (ref 30.0–36.0)
MCV: 111.4 fL — ABNORMAL HIGH (ref 80.0–100.0)
Platelets: 427 10*3/uL — ABNORMAL HIGH (ref 150–400)
RBC: 2.1 MIL/uL — ABNORMAL LOW (ref 3.87–5.11)
RDW: 20.8 % — ABNORMAL HIGH (ref 11.5–15.5)
WBC: 18.6 10*3/uL — ABNORMAL HIGH (ref 4.0–10.5)
nRBC: 0.3 % — ABNORMAL HIGH (ref 0.0–0.2)

## 2020-01-02 LAB — COMPREHENSIVE METABOLIC PANEL
ALT: 21 U/L (ref 0–44)
AST: 39 U/L (ref 15–41)
Albumin: 1.4 g/dL — ABNORMAL LOW (ref 3.5–5.0)
Alkaline Phosphatase: 117 U/L (ref 38–126)
Anion gap: 8 (ref 5–15)
BUN: 56 mg/dL — ABNORMAL HIGH (ref 6–20)
CO2: 20 mmol/L — ABNORMAL LOW (ref 22–32)
Calcium: 8.5 mg/dL — ABNORMAL LOW (ref 8.9–10.3)
Chloride: 118 mmol/L — ABNORMAL HIGH (ref 98–111)
Creatinine, Ser: 0.59 mg/dL (ref 0.44–1.00)
GFR calc Af Amer: >60 mL/min (ref >60)
GFR calc non Af Amer: >60 mL/min (ref >60)
Glucose, Bld: 131 mg/dL — ABNORMAL HIGH (ref 70–99)
Potassium: 3.8 mmol/L (ref 3.5–5.1)
Sodium: 146 mmol/L — ABNORMAL HIGH (ref 135–145)
Total Bilirubin: 1 mg/dL (ref 0.3–1.2)
Total Protein: 5.9 g/dL — ABNORMAL LOW (ref 6.5–8.1)

## 2020-01-02 LAB — CULTURE, BLOOD (ROUTINE X 2)
Culture: NO GROWTH
Special Requests: ADEQUATE

## 2020-01-02 LAB — PREPARE RBC (CROSSMATCH)

## 2020-01-02 MED ORDER — PIVOT 1.5 CAL PO LIQD
1000.0000 mL | ORAL | Status: DC
  Administered 2020-01-02: 1000 mL
  Filled 2020-01-02 (×4): qty 1000

## 2020-01-02 MED ORDER — POTASSIUM CHLORIDE 20 MEQ/15ML (10%) PO SOLN
40.0000 meq | Freq: Once | ORAL | Status: AC
  Administered 2020-01-02: 40 meq
  Filled 2020-01-02: qty 30

## 2020-01-02 MED ORDER — FUROSEMIDE 10 MG/ML IJ SOLN
40.0000 mg | Freq: Once | INTRAMUSCULAR | Status: AC
  Administered 2020-01-02: 40 mg via INTRAVENOUS
  Filled 2020-01-02: qty 4

## 2020-01-02 MED ORDER — SODIUM CHLORIDE 0.9% IV SOLUTION: Freq: Once | INTRAVENOUS | Status: DC

## 2020-01-02 NOTE — Progress Notes (Signed)
8 Days Post-Op    CC:  Fall or weakness  Subjective: Pt remains sedated on the ventilator.  The wound remains about the same we have a picture below.  It looks fairly healthy.   Objective: Vital signs in last 24 hours: Temp:  [98.6 F (37 C)-101 F (38.3 C)] 100.6 F (38.1 C) (06/18 1200) Pulse Rate:  [66-98] 89 (06/18 1400) Resp:  [16-34] 22 (06/18 1400) BP: (83-129)/(49-81) 85/52 (06/18 1400) SpO2:  [94 %-100 %] 100 % (06/18 1400) FiO2 (%):  [30 %-60 %] 30 % (06/18 1131) Weight:  [111.6 kg] 111.6 kg (06/18 0459) Last BM Date: (P) 01/02/20  Intake/Output from previous day: 06/17 0701 - 06/18 0700 In: 2476.9 [I.V.:1041.7; Blood:30; NG/GT:100; IV Piggyback:1305.1] Out: 2275 [Urine:1750; Emesis/NG output:100; Stool:425] Intake/Output this shift: Total I/O In: 8453 [I.V.:336.2; Blood:735; IV Piggyback:297.9] Out: 50 [Stool:50]  General appearance: sedated on the vent  A majority of the wound looks pretty good.  There remains some necrotic tissue at the base of the wound and left side of the wound seen in the picture above.   Lab Results:  Recent Labs    01/01/20 0403 01/02/20 0459  WBC 16.8* 18.6*  HGB 7.1* 6.8*  HCT 24.0* 23.4*  PLT 394 427*    BMET Recent Labs    01/01/20 2000 01/02/20 0459  NA 150* 146*  K 4.4 3.8  CL 120* 118*  CO2 20* 20*  GLUCOSE 123* 131*  BUN 58* 56*  CREATININE 0.62 0.59  CALCIUM 8.4* 8.5*   PT/INR No results for input(s): LABPROT, INR in the last 72 hours.  Recent Labs  Lab 12/28/19 0435 12/29/19 0433 12/30/19 0336 12/31/19 0530 01/02/20 0459  AST 45* 42* 41 43* 39  ALT 20 21 23 23 21   ALKPHOS 262* 228* 184* 157* 117  BILITOT 1.4* 1.3* 1.1 1.0 1.0  PROT 5.3* 5.6* 6.0* 6.3* 5.9*  ALBUMIN 1.5* 1.4* 1.3* 1.4* 1.4*     Lipase  No results found for: LIPASE   Medications: . sodium chloride   Intravenous Once  . ascorbic acid  500 mg Per Tube BID  . chlorhexidine gluconate (MEDLINE KIT)  15 mL Mouth Rinse BID  .  Chlorhexidine Gluconate Cloth  6 each Topical Daily  . feeding supplement (PRO-STAT SUGAR FREE 64)  60 mL Per Tube TID  . ferrous sulfate  300 mg Per Tube BID  . folic acid  1 mg Per Tube Daily  . free water  200 mL Per Tube Q6H  . insulin aspart  0-6 Units Subcutaneous Q4H  . mouth rinse  15 mL Mouth Rinse 10 times per day  . multivitamin  15 mL Per Tube Daily  . nutrition supplement (JUVEN)  1 packet Per Tube BID BM  . nystatin   Topical TID  . pantoprazole sodium  40 mg Per Tube BID  . polyethylene glycol  17 g Oral Daily  . sodium chloride flush  10-40 mL Intracatheter Q12H  . thiamine  100 mg Per Tube Daily  . Vitamin D (Ergocalciferol)  50,000 Units Per Tube Q7 days  . zinc sulfate  220 mg Per Tube Daily   Anti-infectives (From admission, onward)   Start     Dose/Rate Route Frequency Ordered Stop   12/31/19 1600  metroNIDAZOLE (FLAGYL) IVPB 500 mg     Discontinue     500 mg 100 mL/hr over 60 Minutes Intravenous Every 8 hours 12/31/19 1505     12/28/19 1400  vancomycin (VANCOCIN) IVPB  1000 mg/200 mL premix  Status:  Discontinued        1,000 mg 200 mL/hr over 60 Minutes Intravenous Every 24 hours 12/28/19 1005 12/28/19 1343   12/28/19 1400  linezolid (ZYVOX) IVPB 600 mg     Discontinue     600 mg 300 mL/hr over 60 Minutes Intravenous Every 12 hours 12/28/19 1343     12/26/19 1948  vancomycin variable dose per unstable renal function (pharmacist dosing)  Status:  Discontinued         Does not apply See admin instructions 12/26/19 1948 12/28/19 1005   12/23/19 2000  vancomycin (VANCOCIN) IVPB 1000 mg/200 mL premix  Status:  Discontinued        1,000 mg 200 mL/hr over 60 Minutes Intravenous Every 8 hours 12/23/19 1027 12/26/19 1944   12/23/19 1300  clindamycin (CLEOCIN) IVPB 900 mg  Status:  Discontinued        900 mg 100 mL/hr over 30 Minutes Intravenous Every 8 hours 12/23/19 1205 12/31/19 1505   12/23/19 1200  metroNIDAZOLE (FLAGYL) IVPB 500 mg  Status:  Discontinued         500 mg 100 mL/hr over 60 Minutes Intravenous Every 8 hours 12/23/19 1135 12/23/19 1254   12/23/19 1045  vancomycin (VANCOREADY) IVPB 2000 mg/400 mL        2,000 mg 200 mL/hr over 120 Minutes Intravenous NOW 12/23/19 1018 12/23/19 1441   12/23/19 0300  cefTRIAXone (ROCEPHIN) 2 g in sodium chloride 0.9 % 100 mL IVPB     Discontinue     2 g 200 mL/hr over 30 Minutes Intravenous Every 24 hours 12/23/19 0236       Assessment/Plan Acute hypoxic respiratory failure - Vent dependent  - possible aspiration 6/16 Hepatic encephalopathy Alcohol abuse Thrombocytopenia  HTN  Sepsis Necrotizing fasciitis of left buttock -S/Pincision and debridement of skin, subcutaneous tissue, fascia and muscle 15x15x6cm, left buttock5/8 Dr. 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 Redmond Pulling  POD#7 -Appreciate CCM assistance with her care - Continue IV abx  -Dressing changes TID currently. -Demarcated skin at the superior aspectand lateral aspectof the wound will likelyneed to be excised. She also has tan purulent fluid that tracks up the edge of the rectum. This is concerning as if anything else needed to be debrided from this area she would need a significant surgery including an APR. Continue to try and preserve this area as much as we can.  FEN: NPO, IVF,ok for TFs ID: Rocephin, Clindamycin,Linezolid, flagyl VTE: SCD's Foley: placed 6/8  Plan:  We will keep up the hydrotherapy and local wound care.  The hydrotherapy team will work on debridement of the site during therapies.  We do not currently see a need to return her to the OR for debridement.        LOS: 12 days    Elston Aldape 01/02/2020 Please see Amion

## 2020-01-02 NOTE — Progress Notes (Signed)
[facility] Progress Note Patient Name: Alyssa Carey DOB: 1970-09-23 MRN: [id number]   Date of Service  01/02/2020  HPI/Events of Note  Hemoglobin 6.8 gm  eICU Interventions  Transfuse 1 unit of PRBC.        Kerry Kass Chalmer Zheng 01/02/2020, 6:17 AM

## 2020-01-02 NOTE — Progress Notes (Signed)
CRITICAL VALUE ALERT  Critical Value:  Hgb 6.8  Date & Time Notied:  0520 on 6/18  Provider Notified: elink (Dr. Jenetta Downer)  Orders Received/Actions taken: awaiting orders

## 2020-01-02 NOTE — Progress Notes (Addendum)
   01/02/20  Hydrotherapy  Note 1020-1110     Subjective pt on vent, mouthing words  Patient and Family Stated Goals unable to state/on vent   Date of Onset  (abscess present on admission)  Prior Treatments s/p surgical I & D x2, diverting colostomy  Evaluation and Treatment  Evaluation and Treatment Procedures Explained to Patient/Family Yes (eyes open, does not follow)  Evaluation and Treatment Procedures Patient unable to consent due to mental status  Wound / Incision (Open or Dehisced) 12/27/19 Non-pressure wound Buttocks Left *PT ONLY* open wound L gluteal area  Date First Assessed/Time First Assessed: 12/27/19 1410   Wound Type: Non-pressure wound  Location: Buttocks  Location Orientation: Left  Wound Description (Comments): *PT ONLY* open wound L gluteal area  Present on Admission: (c)   Dressing Type ABD;Moist to dry;Paper tape;Silicone dressing  Dressing Changed New  Dressing Change Frequency Twice a day  % Wound base Red or Granulating 15%  % Wound base Yellow/Fibrinous Exudate 70%  % Wound base Black/Eschar 15%  Peri-wound Assessment Excoriated;Maceration  Wound Length (cm) 17 cm  Wound Width (cm) 13 cm  Wound Depth (cm) 8 cm  Wound Volume (cm^3) 1768 cm^3  Wound Surface Area (cm^2) 221 cm^2  Tunneling (cm)  (10 at anal fissure to the rt.)  Undermining (cm) 5 ( at 1:00)  Margins Unattached edges (unapproximated)  Drainage Amount Copious  Drainage Description Odor;Sanguineous  Non-staged Wound Description Full thickness  Treatment Cleansed;Hydrotherapy (Pulse lavage);Packing (Saline gauze)  Hydrotherapy  Pulsed lavage therapy - wound location L gluteal area   Pulsed Lavage with Suction (psi) 12 psi  Pulsed Lavage with Suction - Normal Saline Used 1000 mL  Pulsed Lavage Tip Tip with splash shield  Wound Therapy - Assess/Plan/Recommendations  Wound Therapy - Clinical Statement Pt with extensive open wound L gluteal area d/t necrotizing fasciitis, s/p surgical  I and  D x2. surgical PA in, recommends continued PLS and debridement.. patient is awake, mouthing words  Wound Therapy - Functional Problem List immobility, sedated on vent   Factors Delaying/Impairing Wound Healing Substance abuse;Immobility;Multiple medical problems  Hydrotherapy Plan Debridement;Dressing change;Patient/family education;Pulsatile lavage with suction  Wound Therapy - Frequency 6X / week  Wound Therapy - Current Recommendations PT (when less sedated)  Wound Therapy - Follow Up Recommendations Skilled nursing facility (LT[facility])  Wound Plan Pt will benefit from hydrotherapy as part a multi-modal/multi-disciplinary wound care to approach to facilitate healing and decr bioburden.   Wound Therapy Goals - Improve the function of patient's integumentary system by progressing the wound(s) through the phases of wound healing by:  Decrease Necrotic Tissue to 50  Decrease Necrotic Tissue - Progress Progressing toward goal  Increase Granulation Tissue to 50  Increase Granulation Tissue - Progress Progressing toward goal  Improve Drainage Characteristics Mod  Improve Drainage Characteristics - Progress Progressing toward goal  Goals/treatment plan/discharge plan were made with and agreed upon by patient/family No, Patient unable to participate in goals/treatment/discharge plan and family unavailable  Time For Goal Achievement Other (comment) (3 wks)  Wound Therapy - Potential for Goals Fair  [place] Pager [phone] Office [phone]

## 2020-01-02 NOTE — Progress Notes (Signed)
NAME:  Alyssa Carey, MRN:  [id number], DOB:  [birth year], LOS: 38 ADMISSION DATE:  12/21/2019, CONSULTATION DATE:  6/8 REFERRING MD:  Kshitiz, CHIEF COMPLAINT:  Confusion   Brief History   49 y/o female with a heavy alcohol history admitted in the setting of confusion and presumed alcohol withdrawal who later developed sepsis from a gas forming wound.    Past Medical History  Alcohol abuse  Significant Hospital Events   6/06 admission 6/08 ICU admission to OR ofr I&D, back intubated and on pressors, 6/09 started tube feeds. Still pressor dependent.kept on vent w/ anticipated return to OR planned  6/10 Back to the OR for significant debridement and also placement of diverting colostomy 6/11 Sedated, low-dose phenylephrine. CCS felt wound looking good.  Sedation changed to precedex / PRN fent  6/12 more awake, on 15/5 pressure support 6/13 family discussion >> DNR, continue medical care 6//15 transfuse 1 unit PRBC; vomiting, tube feeds held 6/16 Vomiting overnight 6/17 Trial PSV 15/5 with variable Vt, remains on vasopressors.  Vomiting overnight  6/18 transfuse 1 unit PRBC  Consults:  General surgery  Procedures:  ETT 6/8 >> Right IJ CVL 6/8 >>  Significant Diagnostic Tests:   6/6 CT head > NAICP  6/7 U/S abdomen > hepatic steatosis  6/8 CT Pelvis > gas throughout medial subcutaneous soft tissues of left buttock into the left perineum  Micro Data:  6/06 SARS COV 2 > negative 6/08 blood > negative 6/08 Lt buttocks wound > Proteus, E coli (resistant to ampicillin, intermediate to unasyn), VRE 6/13 blood > negative  Antimicrobials:  Rocephin 6/07 >> Clindamycin 6/08 >> 6/16 Flagyl 6/08 >>  Vancomycin 6/08 >> 6/11 Linezolid 6/13 >>   Interim history/subjective:  Getting PRBC transfusion.  Objective   Blood pressure 106/64, pulse 78, temperature 98.6 F (37 C), temperature source Axillary, resp. rate 19, height 5' 4"  (1.626 m), weight 111.6 kg, last menstrual period  03/23/2019, SpO2 94 %.    Vent Mode: PSV;CPAP FiO2 (%):  [30 %-60 %] 30 % Set Rate:  [16 bmp] 16 bmp Vt Set:  [440 mL] 440 mL PEEP:  [5 cmH20] 5 cmH20 Pressure Support:  [15 cmH20] 15 cmH20 Plateau Pressure:  [21 cmH20-28 cmH20] 26 cmH20   Intake/Output Summary (Last 24 hours) at 01/02/2020 0093 Last data filed at 01/02/2020 8182 Gross per 24 hour  Intake 2460.1 ml  Output 1975 ml  Net 485.1 ml   Filed Weights   12/30/19 0500 01/01/20 0800 01/02/20 0459  Weight: 107.2 kg 108.2 kg 111.6 kg    Examination:  General - alert Eyes - pupils reactive ENT - ETT Cardiac - regular rate/rhythm, no murmur Chest - scattered rhonchi Abdomen - soft, non tender, + bowel sounds, colostomy in place Extremities - 2+ edema Skin - wound dressing clean Neuro - follows commands, moves extremities  Resolved Hospital Problem list   Non gap metabolic acidosis, thrombocytopenia in 2nd to sepsis and alcohol abuse, hepatic encephalopathy, Elevated LFTs from acute alcoholic hepatitis with fatty liver  Assessment & Plan:   Septic shock from necrotizing fasciitis of Lt buttock s/p debridement. - Proteus, E coli, VRE in wound culture - day 12 of ABx currently on rocephin, linezolid, flagyl - wean off pressors to keep MAP > 65 - surgery to determine if she needs repeat debridement - continue hydrotherapy  Acute hypoxic respiratory failure in setting of septic shock. - continue pressure support weaning - day 10 of vent >> if no significant improvement in weaning  over weekend, then will need to d/w family about possible need for trach - f/u CXR intermittently - lasix 40 mg IV x one on 6/18  Vomiting on 6/16, 6/17. - improved 6/18 - tube feeds as tolerated - prn zofran  Acute metabolic encephalopathy 2nd to sepsis. - RASS Goal 0 to -1   Hypokalemia  - follow electrolytes, replace as indicated  - repeat BMP 2000  Hyperglycemia. - No hx DM  - SSI   Anemia of critical illness and chronic  disease. - f/u Hb after PRBC transfusion 6/18 - goal Hb > 7  Best practice:  Nutrition: tube feeding SUP: protonix DVT prophylaxis: lovenox Code status: DNR Disposition: ICU  Labs:   CMP Latest Ref Rng & Units 01/02/2020 01/01/2020 01/01/2020  Glucose 70 - 99 mg/dL 131(H) 123(H) 197(H)  BUN 6 - 20 mg/dL 56(H) 58(H) 65(H)  Creatinine 0.44 - 1.00 mg/dL 0.59 0.62 0.59  Sodium 135 - 145 mmol/L 146(H) 150(H) 146(H)  Potassium 3.5 - 5.1 mmol/L 3.8 4.4 3.0(L)  Chloride 98 - 111 mmol/L 118(H) 120(H) 116(H)  CO2 22 - 32 mmol/L 20(L) 20(L) 21(L)  Calcium 8.9 - 10.3 mg/dL 8.5(L) 8.4(L) 8.4(L)  Total Protein 6.5 - 8.1 g/dL 5.9(L) - -  Total Bilirubin 0.3 - 1.2 mg/dL 1.0 - -  Alkaline Phos 38 - 126 U/L 117 - -  AST 15 - 41 U/L 39 - -  ALT 0 - 44 U/L 21 - -    CBC Latest Ref Rng & Units 01/02/2020 01/01/2020 12/31/2019  WBC 4.0 - 10.5 K/uL 18.6(H) 16.8(H) 19.6(H)  Hemoglobin 12.0 - 15.0 g/dL 6.8(LL) 7.1(L) 7.8(L)  Hematocrit 36 - 46 % 23.4(L) 24.0(L) 25.3(L)  Platelets 150 - 400 K/uL 427(H) 394 325    ABG    Component Value Date/Time   PHART 7.394 12/27/2019 0355   PCO2ART 28.4 (L) 12/27/2019 0355   PO2ART 128 (H) 12/27/2019 0355   HCO3 16.6 (L) 12/27/2019 0355   TCO2 17 (L) 12/25/2019 1726   ACIDBASEDEF 6.6 (H) 12/27/2019 0355   O2SAT 98.6 12/27/2019 0355    CBG (last 3)  Recent Labs    01/01/20 1957 01/01/20 2330 01/02/20 0454  GLUCAP 114* 129* 117*    Signature:  Chesley Mires, MD [place] Pager - [phone] 01/02/2020, 8:14 AM

## 2020-01-02 NOTE — Progress Notes (Signed)
Peripherally Inserted Central Catheter Placement  The IV Nurse has discussed with the patient and/or persons authorized to consent for the patient, the purpose of this procedure and the potential benefits and risks involved with this procedure.  The benefits include less needle sticks, lab draws from the catheter, and the patient may be discharged home with the catheter. Risks include, but not limited to, infection, bleeding, blood clot (thrombus formation), and puncture of an artery; nerve damage and irregular heartbeat and possibility to perform a PICC exchange if needed/ordered by physician.  Alternatives to this procedure were also discussed.  Bard Power PICC patient education guide, fact sheet on infection prevention and patient information card has been provided to patient /or left at bedside.    PICC Placement Documentation  PICC Triple Lumen 26/71/24 PICC Left Basilic 47 cm 2 cm (Active)  Indication for Insertion or Continuance of Line Prolonged intravenous therapies 01/02/20 1900  Exposed Catheter (cm) 2 cm 01/02/20 1900  Site Assessment Clean;Dry;Intact 01/02/20 1900  Lumen #1 Status Flushed;Saline locked;Blood return noted 01/02/20 1900  Lumen #2 Status Flushed;Saline locked;Blood return noted 01/02/20 1900  Lumen #3 Status Blood return noted 01/02/20 1900  Dressing Type Transparent;Securing device 01/02/20 1900  Dressing Status Clean;Dry;Intact;Antimicrobial disc in place 01/02/20 1900  Dressing Change Due 01/09/20 01/02/20 1900       Darlyn Read 01/02/2020, 7:18 PM

## 2020-01-02 NOTE — Progress Notes (Addendum)
Nutrition Follow-up  DOCUMENTATION CODES:   Obesity unspecified  INTERVENTION:  - will re-order TF: Pivot 1.5 @ 20 ml/hr to advance by 10 ml every 24 hours to reach goal rate of 45 ml/hr with 60 ml prostat TID and 1 packet juven BID. - at goal rate, this regimen will provide 2410 kcal, 196 grams protein, and 820 ml free water. - continue free water flush of 200 ml QID (800 ml); adjustment to be per CCM.  NUTRITION DIAGNOSIS:   Increased nutrient needs related to chronic illness, wound healing (alcohol abuse; necrotizing fasciitis) as evidenced by estimated needs. -ongoing  GOAL:   Patient will meet greater than or equal to 90% of their needs -unable to meet at this time  MONITOR:   Vent status, TF tolerance, Labs, Weight trends, Skin  ASSESSMENT:   Pt with PMH of heavy alcohol history who for one week with decreased intake admitted 6/6 with alcoholic hepatitis with hepatic encephalopathy and AKI.  Significant Events: 6/7-admission; initial RD assessment 6/8-Rapid Response; CT pelvis to r/o necrotizing fascitis--found to be positive for necrotizing fascitis and went to OR for I&D 6/9-remained intubated with OGT in place; started TF; added ascorbic acid BID and zinc once/day.  6/10-return to OR for I&D; lap loop colostomy 6/16- vomiting overnight; TF held and OGT to LIS 6/17- vomiting overnight; OGT removed d/t aspiration event and being partly pulled; small bore NGT placed  Patient remains intubated and small bore NGT in place (gastric, 72 cm at the nare), clamped. Patient discussed in rounds this AM and Dr. Halford Chessman states ok to re-start TF at trickle rate.   Weight has continued to trend up. Flow sheet documentation indicates moderate pitting edema to BLE and LUE and deep pitting edema to RUE.   Per notes: - monitor for ability to wean over the weekend - acute metabolic encephalopathy 2/2 sepsis    Patient is currently intubated on ventilator support MV: 9.5 L/min Temp  (24hrs), Avg:99.9 F (37.7 C), Min:98.6 F (37 C), Max:101 F (38.3 C) Propofol: none BP: 113/76 and MAP: 85  Labs reviewed; CBGs: 117, 114, and 133 mg/dl, Na: 146 mmol/l, Cl: 118 mmol/l, BUN: 56 mg/dl, Ca: 8.5 mg/dl. Medications reviewed; 500 mg ascorbic acid BID, 300 mg ferrous sulfate BID, 1 mg folvite/day, 40 mg IV lasix x2 doses 6/17 and x1 dose 6/18, sliding scale novolog, 5 mg IV reglan x2 doses 6/17, 15 ml multivitamin/day, 17 g miralax/day, 10 mEq IV KCl x6 runs 6/17, 40 mEq KCl x1 dose 6/17 and x1 dose 6/18, 100 mg thiamine/day, 50000 units x1 dose/week starting 6/17, 220 mg zinc sulfate/day.  Drips; precedex @ 1.2 mcg/kg/hr, neo @ 20 mcg/min.   Diet Order:   Diet Order            Diet NPO time specified  Diet effective now                 EDUCATION NEEDS:   Not appropriate for education at this time  Skin:  Skin Assessment:  (necrotizing fasciitis to L buttock) Skin Integrity Issues:: DTI DTI: R ear Stage II: NA Unstageable: NA Incisions: L buttocks (6/8 + 6/10); abdomen (6/10)  Last BM:  6/17  Height:   Ht Readings from Last 1 Encounters:  01/01/20 5' 4"  (1.626 m)    Weight:   Wt Readings from Last 1 Encounters:  01/02/20 111.6 kg    Estimated Nutritional Needs:  Kcal:  2200-2400 Protein:  180-195 grams Fluid:  >/= 2 L/day  Jarome Matin, MS, RD, LDN, CNSC Inpatient Clinical Dietitian RD pager # available in [place]  After hours/weekend pager # available in [facility]

## 2020-01-03 LAB — CBC
HCT: 27.2 % — ABNORMAL LOW (ref 36.0–46.0)
Hemoglobin: 8.4 g/dL — ABNORMAL LOW (ref 12.0–15.0)
MCH: 32.8 pg (ref 26.0–34.0)
MCHC: 30.9 g/dL (ref 30.0–36.0)
MCV: 106.3 fL — ABNORMAL HIGH (ref 80.0–100.0)
Platelets: 439 10*3/uL — ABNORMAL HIGH (ref 150–400)
RBC: 2.56 MIL/uL — ABNORMAL LOW (ref 3.87–5.11)
RDW: 22.7 % — ABNORMAL HIGH (ref 11.5–15.5)
WBC: 19.6 10*3/uL — ABNORMAL HIGH (ref 4.0–10.5)
nRBC: 0.2 % (ref 0.0–0.2)

## 2020-01-03 LAB — BASIC METABOLIC PANEL
Anion gap: 9 (ref 5–15)
BUN: 51 mg/dL — ABNORMAL HIGH (ref 6–20)
CO2: 21 mmol/L — ABNORMAL LOW (ref 22–32)
Calcium: 8.5 mg/dL — ABNORMAL LOW (ref 8.9–10.3)
Chloride: 117 mmol/L — ABNORMAL HIGH (ref 98–111)
Creatinine, Ser: 0.55 mg/dL (ref 0.44–1.00)
GFR calc Af Amer: >60 mL/min (ref >60)
GFR calc non Af Amer: >60 mL/min (ref >60)
Glucose, Bld: 167 mg/dL — ABNORMAL HIGH (ref 70–99)
Potassium: 3.5 mmol/L (ref 3.5–5.1)
Sodium: 147 mmol/L — ABNORMAL HIGH (ref 135–145)

## 2020-01-03 LAB — GLUCOSE, CAPILLARY
Glucose-Capillary: 147 mg/dL — ABNORMAL HIGH (ref 70–99)
Glucose-Capillary: 113 mg/dL — ABNORMAL HIGH (ref 70–99)
Glucose-Capillary: 146 mg/dL — ABNORMAL HIGH (ref 70–99)
Glucose-Capillary: 153 mg/dL — ABNORMAL HIGH (ref 70–99)
Glucose-Capillary: 141 mg/dL — ABNORMAL HIGH (ref 70–99)
Glucose-Capillary: 168 mg/dL — ABNORMAL HIGH (ref 70–99)

## 2020-01-03 LAB — VITAMIN A: Vitamin A (Retinoic Acid): 4.3 ug/dL — ABNORMAL LOW (ref 20.1–62.0)

## 2020-01-03 MED ORDER — DEXMEDETOMIDINE HCL IN NACL 200 MCG/50ML IV SOLN
0.4000 ug/kg/h | INTRAVENOUS | Status: DC
  Administered 2020-01-03 – 2020-01-04 (×13): 1.2 ug/kg/h via INTRAVENOUS
  Filled 2020-01-03: qty 50
  Filled 2020-01-03 (×2): qty 100
  Filled 2020-01-03: qty 50
  Filled 2020-01-03 (×3): qty 100

## 2020-01-03 MED ORDER — HEPARIN SODIUM (PORCINE) 5000 UNIT/ML IJ SOLN
5000.0000 [IU] | Freq: Three times a day (TID) | INTRAMUSCULAR | Status: DC
  Administered 2020-01-03 – 2020-01-08 (×14): 5000 [IU] via SUBCUTANEOUS
  Filled 2020-01-03 (×14): qty 1

## 2020-01-03 NOTE — Progress Notes (Signed)
PT will undergo hydrotherapy at approximately 1000 (RT stated that this therapy made weaning more difficult yesterday). RT will attempt to wean when procedure is complete. RN aware and agrees to contact RT when PT is available.

## 2020-01-03 NOTE — Progress Notes (Signed)
   01/03/20 1400 hydrotherapy note  Subjective Assessment  Subjective pt on vent, mouthing words  Patient and Family Stated Goals unable to state/on vent sedated   Date of Onset  (abscess present on admission)  Prior Treatments s/p surgical I & D x2, diverting colostomy  Evaluation and Treatment  Evaluation and Treatment Procedures Explained to Patient/Family Yes (eyes open, does not follow)  Evaluation and Treatment Procedures Patient unable to consent due to mental status  Wound / Incision (Open or Dehisced) 12/27/19 Non-pressure wound Buttocks Left *PT ONLY* open wound L gluteal area  Date First Assessed/Time First Assessed: 12/27/19 1410   Wound Type: Non-pressure wound  Location: Buttocks  Location Orientation: Left  Wound Description (Comments): *PT ONLY* open wound L gluteal area  Present on Admission: (c)   Dressing Type ABD;Barrier Film (skin prep);Moist to dry;Silver dressings  Dressing Changed New  Dressing Status Clean  Dressing Change Frequency PRN  Site / Wound Assessment Granulation tissue;Bleeding;Brown  % Wound base Red or Granulating 30%  % Wound base Yellow/Fibrinous Exudate 30%  % Wound base Black/Eschar 40% (necrotic fatty ts)  Peri-wound Assessment Bleeding;Erythema (non-blanchable);Excoriated  Wound Length (cm) 17 cm  Wound Width (cm) 13 cm  Wound Depth (cm) 8 cm  Wound Volume (cm^3) 1768 cm^3  Wound Surface Area (cm^2) 221 cm^2  Tunneling (cm) 6 (@300 )  Undermining (cm) 4 (1200)  Margins Unattached edges (unapproximated)  Drainage Amount Copious  Drainage Description Serosanguineous;No odor  Non-staged Wound Description Full thickness  Treatment Debridement (Selective);Hydrotherapy (Pulse lavage);Packing (Saline gauze)  Hydrotherapy  Pulsed lavage therapy - wound location L gluteal area   Pulsed Lavage with Suction (psi) 12 psi  Pulsed Lavage with Suction - Normal Saline Used 1000 mL  Pulsed Lavage Tip Tip with splash shield  Selective Debridement   Selective Debridement - Location glte left  Selective Debridement - Tools Used Forceps;Scalpel  Selective Debridement - Tissue Removed necrotic fat  Wound Therapy - Assess/Plan/Recommendations  Wound Therapy - Clinical Statement Pt with extensive open wound L gluteal area d/t necrotizing fasciitis, s/p surgical  I and D x2.  patient is aake, mouthing, indicating pain. RN aware and will medicate.. pt. on vent  Wound Therapy - Functional Problem List immobility, sedated on vent   Factors Delaying/Impairing Wound Healing Substance abuse;Immobility;Multiple medical problems  Hydrotherapy Plan Debridement;Dressing change;Patient/family education;Pulsatile lavage with suction  Wound Therapy - Frequency 6X / week  Wound Therapy - Current Recommendations PT (when less sedated)  Wound Therapy - Follow Up Recommendations Skilled nursing facility (LTAC)  Wound Plan Pt will benefit from hydrotherapy as part a multi-modal/multi-disciplinary wound care to approach to facilitate healing and decr bioburden.   Wound Therapy Goals - Improve the function of patient's integumentary system by progressing the wound(s) through the phases of wound healing by:  Decrease Necrotic Tissue to 50  Decrease Necrotic Tissue - Progress Progressing toward goal  Increase Granulation Tissue to 50  Increase Granulation Tissue - Progress Progressing toward goal  Improve Drainage Characteristics Mod  Improve Drainage Characteristics - Progress Progressing toward goal  Goals/treatment plan/discharge plan were made with and agreed upon by patient/family No, Patient unable to participate in goals/treatment/discharge plan and family unavailable  Time For Goal Achievement Other (comment) (3 wks)  Wound Therapy - Potential for Goals Fair  [place] Pager [phone] Office [phone]

## 2020-01-03 NOTE — Progress Notes (Signed)
NAME:  Alyssa Carey, MRN:  [id number], DOB:  [birth year], LOS: 84 ADMISSION DATE:  12/21/2019, CONSULTATION DATE:  6/8 REFERRING MD:  Kshitiz, CHIEF COMPLAINT:  Confusion   Brief History   49 y/o female with a heavy alcohol history admitted in the setting of confusion and presumed alcohol withdrawal who later developed sepsis from a gas forming wound.    Past Medical History  Alcohol abuse  Significant Hospital Events   6/06 admission 6/08 ICU admission to OR ofr I&D, back intubated and on pressors, 6/09 started tube feeds. Still pressor dependent.kept on vent w/ anticipated return to OR planned  6/10 Back to the OR for significant debridement and also placement of diverting colostomy 6/11 Sedated, low-dose phenylephrine. CCS felt wound looking good.  Sedation changed to precedex / PRN fent  6/12 more awake, on 15/5 pressure support 6/13 family discussion >> DNR, continue medical care 6//15 transfuse 1 unit PRBC; vomiting, tube feeds held 6/16 Vomiting overnight 6/17 Trial PSV 15/5 with variable Vt, remains on vasopressors.  Vomiting overnight  6/18 transfuse 1 unit PRBC  Consults:  General surgery  Procedures:  ETT 6/8 >> Right IJ CVL 6/8 >>6/18  PIC  LUE  6/18   Significant Diagnostic Tests:   6/6 CT head > NAICP  6/7 U/S abdomen > hepatic steatosis  6/8 CT Pelvis > gas throughout medial subcutaneous soft tissues of left buttock into the left perineum  Micro Data:  6/06 SARS COV 2 > negative 6/08 blood > negative 6/08 Lt buttocks wound > Proteus, E coli (resistant to ampicillin, intermediate to unasyn), VRE 6/13 blood > negative  Antimicrobials:  Rocephin 6/07 >> Clindamycin 6/08 >> 6/16 Flagyl 6/08 >>  Vancomycin 6/08 >> 6/11 Linezolid 6/13 >>    Scheduled Meds: . sodium chloride   Intravenous Once  . ascorbic acid  500 mg Per Tube BID  . chlorhexidine gluconate (MEDLINE KIT)  15 mL Mouth Rinse BID  . Chlorhexidine Gluconate Cloth  6 each Topical Daily    . feeding supplement (PRO-STAT SUGAR FREE 64)  60 mL Per Tube TID  . ferrous sulfate  300 mg Per Tube BID  . folic acid  1 mg Per Tube Daily  . free water  200 mL Per Tube Q6H  . insulin aspart  0-6 Units Subcutaneous Q4H  . mouth rinse  15 mL Mouth Rinse 10 times per day  . multivitamin  15 mL Per Tube Daily  . nutrition supplement (JUVEN)  1 packet Per Tube BID BM  . nystatin   Topical TID  . pantoprazole sodium  40 mg Per Tube BID  . polyethylene glycol  17 g Oral Daily  . sodium chloride flush  10-40 mL Intracatheter Q12H  . thiamine  100 mg Per Tube Daily  . Vitamin D (Ergocalciferol)  50,000 Units Per Tube Q7 days  . zinc sulfate  220 mg Per Tube Daily   Continuous Infusions: . sodium chloride    . cefTRIAXone (ROCEPHIN)  IV Stopped (01/03/20 0000)  . dexmedetomidine (PRECEDEX) IV infusion 1.2 mcg/kg/hr (01/03/20 0845)  . feeding supplement (PIVOT 1.5 CAL) 10 mL/hr at 01/02/20 2000  . linezolid (ZYVOX) IV Stopped (01/03/20 0329)  . metronidazole 500 mg (01/03/20 0846)  . phenylephrine (NEO-SYNEPHRINE) Adult infusion 30 mcg/min (01/03/20 0800)   PRN Meds:.sodium chloride, acetaminophen (TYLENOL) oral liquid 160 mg/5 mL, fentaNYL (SUBLIMAZE) injection, [DISCONTINUED] ondansetron **OR** ondansetron (ZOFRAN) IV, pneumococcal 23 valent vaccine   Interim history/subjective:   awaiting hydrotherapy / following commands  nad on vent  Objective   Blood pressure 115/75, pulse 94, temperature (!) 101 F (38.3 C), temperature source Axillary, resp. rate (!) 23, height _0  (1.626 m), weight 111.3 kg, last menstrual period 03/23/2019, SpO2 100 %.    Vent Mode: PRVC FiO2 (%):  [30 %] 30 % Set Rate:  [16 bmp] 16 bmp Vt Set:  [440 mL] 440 mL PEEP:  [5 cmH20] 5 cmH20 Plateau Pressure:  [20 cmH20-27 cmH20] 22 cmH20   Intake/Output Summary (Last 24 hours) at 01/03/2020 0951 Last data filed at 01/03/2020 0800 Gross per 24 hour  Intake 2363 ml  Output 1550 ml  Net 813 ml   Filed  Weights   01/01/20 0800 01/02/20 0459 01/03/20 0500  Weight: 108.2 kg 111.6 kg 111.3 kg    Examination: Tmax 101.3 slt trend down Pt alert, f/c on precedex  Oropharynx et/ nasal ft Neck supple Lungs with coarse  rhonchi bilaterally RRR no s3 or or sign murmur/ still on low dose phenylephrine  Abd obese soft  excursion / tf at goal / colonstomy  In place Extr warm with  1+ pitting edema/ prevalon boots Neuro  Sensorium intact       Resolved Hospital Problem list   Non gap metabolic acidosis, thrombocytopenia in 2nd to sepsis and alcohol abuse, hepatic encephalopathy, Elevated LFTs from acute alcoholic hepatitis with fatty liver  Assessment & Plan:   Septic shock from necrotizing fasciitis of Lt buttock s/p debridement. - Proteus, E coli, VRE in wound culture -  currently on rocephin, linezolid, flagyl - see micro flowsheet  -  wean off pressors to keep MAP > 65 -  surgery to determine if she needs repeat debridement -  continue hydrotherapy  Acute hypoxic respiratory failure in setting of septic shock. - continue pressure support weaning as tol  - day 11 of vent >> if no significant improvement in weaning over weekend, then will need to d/w family about possible need for trach    Vomiting on 6/16, 6/17. -resolved  6/18 - tube feeds doing fine  - prn zofran  Acute metabolic encephalopathy 2nd to sepsis. - RASS Goal 0 to -1   Hypokalemia  - follow electrolytes, replace as indicated     Hyperglycemia. - No hx DM  - SSI   Anemia of critical illness and chronic disease.   Lab Results  Component Value Date   HGB 8.4 (L) 01/03/2020   HGB 6.8 (LL) 01/02/2020   HGB 7.1 (L) 01/01/2020    - f/u Hb after PRBC transfusion 6/18 > note:  approp response to one unit prbc's  - goal Hb > 7   Best practice:  Nutrition: tube feeding SUP: protonix DVT prophylaxis:sq hep restarted 6/19 as refuses PAS Code status: DNR Disposition: ICU  Labs:   CMP Latest Ref Rng & Units  01/03/2020 01/02/2020 01/01/2020  Glucose 70 - 99 mg/dL 167(H) 131(H) 123(H)  BUN 6 - 20 mg/dL 51(H) 56(H) 58(H)  Creatinine 0.44 - 1.00 mg/dL 0.55 0.59 0.62  Sodium 135 - 145 mmol/L 147(H) 146(H) 150(H)  Potassium 3.5 - 5.1 mmol/L 3.5 3.8 4.4  Chloride 98 - 111 mmol/L 117(H) 118(H) 120(H)  CO2 22 - 32 mmol/L 21(L) 20(L) 20(L)  Calcium 8.9 - 10.3 mg/dL 8.5(L) 8.5(L) 8.4(L)  Total Protein 6.5 - 8.1 g/dL - 5.9(L) -  Total Bilirubin 0.3 - 1.2 mg/dL - 1.0 -  Alkaline Phos 38 - 126 U/L - 117 -  AST 15 - 41 U/L - 39 -  ALT 0 - 44 U/L - 21 -    CBC Latest Ref Rng & Units 01/03/2020 01/02/2020 01/01/2020  WBC 4.0 - 10.5 K/uL 19.6(H) 18.6(H) 16.8(H)  Hemoglobin 12.0 - 15.0 g/dL 8.4(L) 6.8(LL) 7.1(L)  Hematocrit 36 - 46 % 27.2(L) 23.4(L) 24.0(L)  Platelets 150 - 400 K/uL 439(H) 427(H) 394    ABG    Component Value Date/Time   PHART 7.394 12/27/2019 0355   PCO2ART 28.4 (L) 12/27/2019 0355   PO2ART 128 (H) 12/27/2019 0355   HCO3 16.6 (L) 12/27/2019 0355   TCO2 17 (L) 12/25/2019 1726   ACIDBASEDEF 6.6 (H) 12/27/2019 0355   O2SAT 98.6 12/27/2019 0355    CBG (last 3)  Recent Labs    01/02/20 2317 01/03/20 0354 01/03/20 0812  GLUCAP 113* 147* 113*       The patient is critically ill with multiple organ systems failure and requires high complexity decision making for assessment and support, frequent evaluation and titration of therapies, application of advanced monitoring technologies and extensive interpretation of multiple databases. Critical Care Time devoted to patient care services described in this note is 38 minutes.    Christinia Gully, MD Pulmonary and [place] [phone] After 6:00 PM or weekends, use [facility] [phone]  After 7:00 pm call Elink  [phone]

## 2020-01-03 NOTE — Progress Notes (Signed)
eLink Physician-Brief Progress Note Patient Name: Alyssa Carey DOB: 1971/06/05 MRN: [id number]   Date of Service  01/03/2020  HPI/Events of Note  Request to have Precedex reordered and am labs  eICU Interventions   Patient on precedex 1.2 and this is continued. She remains intubated  AM CBC and BMP ordered     Intervention Category Minor Interventions: Agitation / anxiety - evaluation and management;Electrolytes abnormality - evaluation and management  Judd Lien 01/03/2020, 8:48 PM

## 2020-01-03 NOTE — Progress Notes (Signed)
Pt. Had an 8 beat run of Vtach during hydrotherapy on wound.

## 2020-01-04 ENCOUNTER — Inpatient Hospital Stay (HOSPITAL_COMMUNITY): Payer: Medicaid Other

## 2020-01-04 LAB — CBC
HCT: 25.1 % — ABNORMAL LOW (ref 36.0–46.0)
Hemoglobin: 7.4 g/dL — ABNORMAL LOW (ref 12.0–15.0)
MCH: 32.2 pg (ref 26.0–34.0)
MCHC: 29.5 g/dL — ABNORMAL LOW (ref 30.0–36.0)
MCV: 109.1 fL — ABNORMAL HIGH (ref 80.0–100.0)
Platelets: 402 10*3/uL — ABNORMAL HIGH (ref 150–400)
RBC: 2.3 MIL/uL — ABNORMAL LOW (ref 3.87–5.11)
RDW: 22.7 % — ABNORMAL HIGH (ref 11.5–15.5)
WBC: 13.3 10*3/uL — ABNORMAL HIGH (ref 4.0–10.5)
nRBC: 0.2 % (ref 0.0–0.2)

## 2020-01-04 LAB — GLUCOSE, CAPILLARY
Glucose-Capillary: 152 mg/dL — ABNORMAL HIGH (ref 70–99)
Glucose-Capillary: 140 mg/dL — ABNORMAL HIGH (ref 70–99)
Glucose-Capillary: 179 mg/dL — ABNORMAL HIGH (ref 70–99)
Glucose-Capillary: 185 mg/dL — ABNORMAL HIGH (ref 70–99)
Glucose-Capillary: 138 mg/dL — ABNORMAL HIGH (ref 70–99)
Glucose-Capillary: 169 mg/dL — ABNORMAL HIGH (ref 70–99)

## 2020-01-04 LAB — BASIC METABOLIC PANEL
Anion gap: 5 (ref 5–15)
BUN: 44 mg/dL — ABNORMAL HIGH (ref 6–20)
CO2: 22 mmol/L (ref 22–32)
Calcium: 8.1 mg/dL — ABNORMAL LOW (ref 8.9–10.3)
Chloride: 119 mmol/L — ABNORMAL HIGH (ref 98–111)
Creatinine, Ser: 0.48 mg/dL (ref 0.44–1.00)
GFR calc Af Amer: >60 mL/min (ref >60)
GFR calc non Af Amer: >60 mL/min (ref >60)
Glucose, Bld: 174 mg/dL — ABNORMAL HIGH (ref 70–99)
Potassium: 3.2 mmol/L — ABNORMAL LOW (ref 3.5–5.1)
Sodium: 146 mmol/L — ABNORMAL HIGH (ref 135–145)

## 2020-01-04 LAB — TYPE AND SCREEN
ABO/RH(D): A POS
Antibody Screen: NEGATIVE
Unit division: 0
Unit division: 0

## 2020-01-04 LAB — BPAM RBC
Blood Product Expiration Date: 202106202359
Blood Product Expiration Date: 202107042359
ISSUE DATE / TIME: 202106150839
ISSUE DATE / TIME: 202106180640
Unit Type and Rh: 6200
Unit Type and Rh: 6200

## 2020-01-04 MED ORDER — DEXMEDETOMIDINE HCL IN NACL 400 MCG/100ML IV SOLN
0.4000 ug/kg/h | INTRAVENOUS | Status: DC
  Administered 2020-01-04 – 2020-01-07 (×21): 1.2 ug/kg/h via INTRAVENOUS
  Filled 2020-01-04 (×21): qty 100

## 2020-01-04 MED ORDER — POTASSIUM CHLORIDE 20 MEQ/15ML (10%) PO SOLN
20.0000 meq | ORAL | Status: AC
  Administered 2020-01-04 (×2): 20 meq
  Filled 2020-01-04 (×2): qty 15

## 2020-01-04 NOTE — Progress Notes (Signed)
RN called RT to inform them of pt's ETT depth. ETT currently measuring 16 at the lip. RT placed ETT back at the original depth of 21cm from the center lip. RN ordering a follow-up CXR, pt has bilateral BS and SATs are stable. HR is 120-130. RT will continue to monitor.

## 2020-01-04 NOTE — Progress Notes (Signed)
[facility] ADULT ICU REPLACEMENT PROTOCOL FOR AM LAB REPLACEMENT ONLY  The patient does apply for the [facility] Adult ICU Electrolyte Replacement Protocol based on the criteria listed below:   Abnormal electrolyte(s): K 3.2  Bertina Guthridge DNP Elink RN 06:07 AM

## 2020-01-04 NOTE — Progress Notes (Signed)
NAME:  Alyssa Carey, MRN:  [id number], DOB:  [birth year], LOS: 68 ADMISSION DATE:  12/21/2019, CONSULTATION DATE:  6/8 REFERRING MD:  Kshitiz, CHIEF COMPLAINT:  Confusion   Brief History   49 y/o female with a heavy alcohol history admitted in the setting of confusion and presumed alcohol withdrawal who later developed sepsis from a gas forming wound.    Past Medical History  Alcohol abuse  Significant Hospital Events   6/06 admission 6/08 ICU admission to OR ofr I&D, back intubated and on pressors, 6/09 started tube feeds. Still pressor dependent.kept on vent w/ anticipated return to OR planned  6/10 Back to the OR for significant debridement and also placement of diverting colostomy 6/11 Sedated, low-dose phenylephrine. CCS felt wound looking good.  Sedation changed to precedex / PRN fent  6/12 more awake, on 15/5 pressure support 6/13 family discussion >> DNR, continue medical care 6//15 transfuse 1 unit PRBC; vomiting, tube feeds held 6/16 Vomiting overnight 6/17 Trial PSV 15/5 with variable Vt, remains on vasopressors.  Vomiting overnight  6/18 transfuse 1 unit PRBC  Consults:  General surgery  Procedures:  ETT 6/8 >> Right IJ CVL 6/8 >>6/18  PIC  LUE  6/18   Significant Diagnostic Tests:   6/6 CT head > NAICP  6/7 U/S abdomen > hepatic steatosis  6/8 CT Pelvis > gas throughout medial subcutaneous soft tissues of left buttock into the left perineum  Micro Data:  6/06 SARS COV 2 > negative 6/08 blood > negative 6/08 Lt buttocks wound > Proteus, E coli (resistant to ampicillin, intermediate to unasyn), VRE 6/13 blood > negative  Antimicrobials:  Rocephin 6/07 >> Clindamycin 6/08 >> 6/16 Vancomycin 6/08 >> 6/11 Flagyl 6/08 >>  Linezolid 6/13 >>    Scheduled Meds: . sodium chloride   Intravenous Once  . ascorbic acid  500 mg Per Tube BID  . chlorhexidine gluconate (MEDLINE KIT)  15 mL Mouth Rinse BID  . Chlorhexidine Gluconate Cloth  6 each Topical Daily    . feeding supplement (PRO-STAT SUGAR FREE 64)  60 mL Per Tube TID  . ferrous sulfate  300 mg Per Tube BID  . folic acid  1 mg Per Tube Daily  . free water  200 mL Per Tube Q6H  . heparin injection (subcutaneous)  5,000 Units Subcutaneous Q8H  . insulin aspart  0-6 Units Subcutaneous Q4H  . mouth rinse  15 mL Mouth Rinse 10 times per day  . multivitamin  15 mL Per Tube Daily  . nutrition supplement (JUVEN)  1 packet Per Tube BID BM  . pantoprazole sodium  40 mg Per Tube BID  . polyethylene glycol  17 g Oral Daily  . sodium chloride flush  10-40 mL Intracatheter Q12H  . thiamine  100 mg Per Tube Daily  . Vitamin D (Ergocalciferol)  50,000 Units Per Tube Q7 days  . zinc sulfate  220 mg Per Tube Daily   Continuous Infusions: . sodium chloride    . cefTRIAXone (ROCEPHIN)  IV Stopped (01/03/20 2246)  . dexmedetomidine (PRECEDEX) IV infusion 1.2 mcg/kg/hr (01/04/20 0841)  . feeding supplement (PIVOT 1.5 CAL) 30 mL/hr at 01/03/20 1900  . linezolid (ZYVOX) IV Stopped (01/04/20 0308)  . metronidazole 500 mg (01/04/20 0909)  . phenylephrine (NEO-SYNEPHRINE) Adult infusion Stopped (01/03/20 1326)   PRN Meds:.sodium chloride, acetaminophen (TYLENOL) oral liquid 160 mg/5 mL, fentaNYL (SUBLIMAZE) injection, [DISCONTINUED] ondansetron **OR** ondansetron (ZOFRAN) IV, pneumococcal 23 valent vaccine   Interim history/subjective:  Off pressors,  On  precedex and weaning on PSV   Objective   Blood pressure 92/64, pulse 85, temperature 98.9 F (37.2 C), temperature source Axillary, resp. rate (!) 33, height 5' 4"  (1.626 m), weight 111.3 kg, last menstrual period 03/23/2019, SpO2 100 %.    Vent Mode: CPAP FiO2 (%):  [30 %] 30 % Set Rate:  [16 bmp] 16 bmp Vt Set:  [440 mL] 440 mL PEEP:  [5 cmH20] 5 cmH20 Pressure Support:  [5 XFG18-29 cmH20] 5 cmH20 Plateau Pressure:  [22 cmH20-24 cmH20] 24 cmH20   Intake/Output Summary (Last 24 hours) at 01/04/2020 1018 Last data filed at 01/04/2020 9371 Gross  per 24 hour  Intake 2364.92 ml  Output 2075 ml  Net 289.92 ml   Filed Weights   01/01/20 0800 01/02/20 0459 01/03/20 0500  Weight: 108.2 kg 111.6 kg 111.3 kg    Examination: Tmax 100.9 continues trend down  Pt alert, f/c on precedex   Pt nods approp on precedex  No jvd Oropharynx et/ nasal feeding tube  Neck supple Lungs with min  rhonchi bilaterally RRR no s3 or or sign murmur Abd obese with very poor excursion  Extr warm with 1+ pitting both LE's      Resolved Hospital Problem list   Non gap metabolic acidosis, thrombocytopenia in 2nd to sepsis and alcohol abuse, hepatic encephalopathy, Elevated LFTs from acute alcoholic hepatitis with fatty liver  Assessment & Plan:   Septic shock from necrotizing fasciitis of Lt buttock s/p debridement. -  Proteus, E coli, VRE in wound culture -  Continue rocephin, linezolid, flagyl - see micro flowsheet  -  Off pressors as of pm 3/19  -  surgery to determine if she needs repeat debridement -  continue hydrotherapy as per surgery instructions  Acute hypoxic respiratory failure in setting of septic shock. - continue pressure support weaning as tol  - day 12 of vent >> keep working on weaning but suspect will need trach this coming weak  >>> recheck cxr am 6/21    Vomiting on 6/16, 6/17. -resolved  6/18 - tube feeds tol well  - prn zofran  Acute metabolic encephalopathy 2nd to sepsis. - RASS Goal 0 to -1   Hypokalemia  - follow electrolytes, replace as indicated     Hyperglycemia. - No hx DM  - SSI   Anemia of critical illness and chronic disease.   Lab Results  Component Value Date   HGB 7.4 (L) 01/04/2020   HGB 8.4 (L) 01/03/2020   HGB 6.8 (LL) 01/02/2020    - f/u Hb after PRBC transfusion 6/18 > note:  approp response to one unit prbc's  - goal Hb > 7  >>> recheck am 6/21 on hep as refuses PAS  Best practice:  Nutrition: tube feeding Sedation: Precedex  SUP: protonix DVT prophylaxis :sq hep restarted 6/19  as refuses PAS Code status: DNR Disposition: ICU  Labs:   CMP Latest Ref Rng & Units 01/04/2020 01/03/2020 01/02/2020  Glucose 70 - 99 mg/dL 174(H) 167(H) 131(H)  BUN 6 - 20 mg/dL 44(H) 51(H) 56(H)  Creatinine 0.44 - 1.00 mg/dL 0.48 0.55 0.59  Sodium 135 - 145 mmol/L 146(H) 147(H) 146(H)  Potassium 3.5 - 5.1 mmol/L 3.2(L) 3.5 3.8  Chloride 98 - 111 mmol/L 119(H) 117(H) 118(H)  CO2 22 - 32 mmol/L 22 21(L) 20(L)  Calcium 8.9 - 10.3 mg/dL 8.1(L) 8.5(L) 8.5(L)  Total Protein 6.5 - 8.1 g/dL - - 5.9(L)  Total Bilirubin 0.3 - 1.2 mg/dL - - 1.0  Alkaline Phos 38 - 126 U/L - - 117  AST 15 - 41 U/L - - 39  ALT 0 - 44 U/L - - 21    CBC Latest Ref Rng & Units 01/04/2020 01/03/2020 01/02/2020  WBC 4.0 - 10.5 K/uL 13.3(H) 19.6(H) 18.6(H)  Hemoglobin 12.0 - 15.0 g/dL 7.4(L) 8.4(L) 6.8(LL)  Hematocrit 36 - 46 % 25.1(L) 27.2(L) 23.4(L)  Platelets 150 - 400 K/uL 402(H) 439(H) 427(H)    ABG    Component Value Date/Time   PHART 7.394 12/27/2019 0355   PCO2ART 28.4 (L) 12/27/2019 0355   PO2ART 128 (H) 12/27/2019 0355   HCO3 16.6 (L) 12/27/2019 0355   TCO2 17 (L) 12/25/2019 1726   ACIDBASEDEF 6.6 (H) 12/27/2019 0355   O2SAT 98.6 12/27/2019 0355    CBG (last 3)  Recent Labs    01/03/20 1945 01/03/20 2326 01/04/20 0428  GLUCAP 141* 168* 152*      The patient is critically ill with multiple organ systems failure and requires high complexity decision making for assessment and support, frequent evaluation and titration of therapies, application of advanced monitoring technologies and extensive interpretation of multiple databases. Critical Care Time devoted to patient care services described in this note is 35 minutes.     Christinia Gully, MD Pulmonary and [place] [phone]   After 7:00 pm call Elink  [phone]

## 2020-01-05 ENCOUNTER — Inpatient Hospital Stay (HOSPITAL_COMMUNITY): Payer: Medicaid Other

## 2020-01-05 LAB — CBC
HCT: 25.5 % — ABNORMAL LOW (ref 36.0–46.0)
Hemoglobin: 7.5 g/dL — ABNORMAL LOW (ref 12.0–15.0)
MCH: 32.8 pg (ref 26.0–34.0)
MCHC: 29.4 g/dL — ABNORMAL LOW (ref 30.0–36.0)
MCV: 111.4 fL — ABNORMAL HIGH (ref 80.0–100.0)
Platelets: 412 10*3/uL — ABNORMAL HIGH (ref 150–400)
RBC: 2.29 MIL/uL — ABNORMAL LOW (ref 3.87–5.11)
RDW: 22.7 % — ABNORMAL HIGH (ref 11.5–15.5)
WBC: 11.4 10*3/uL — ABNORMAL HIGH (ref 4.0–10.5)
nRBC: 0.2 % (ref 0.0–0.2)

## 2020-01-05 LAB — BASIC METABOLIC PANEL
Anion gap: 8 (ref 5–15)
BUN: 44 mg/dL — ABNORMAL HIGH (ref 6–20)
CO2: 22 mmol/L (ref 22–32)
Calcium: 8.2 mg/dL — ABNORMAL LOW (ref 8.9–10.3)
Chloride: 120 mmol/L — ABNORMAL HIGH (ref 98–111)
Creatinine, Ser: 0.42 mg/dL — ABNORMAL LOW (ref 0.44–1.00)
GFR calc Af Amer: >60 mL/min (ref >60)
GFR calc non Af Amer: >60 mL/min (ref >60)
Glucose, Bld: 180 mg/dL — ABNORMAL HIGH (ref 70–99)
Potassium: 3.4 mmol/L — ABNORMAL LOW (ref 3.5–5.1)
Sodium: 150 mmol/L — ABNORMAL HIGH (ref 135–145)

## 2020-01-05 LAB — GLUCOSE, CAPILLARY
Glucose-Capillary: 155 mg/dL — ABNORMAL HIGH (ref 70–99)
Glucose-Capillary: 150 mg/dL — ABNORMAL HIGH (ref 70–99)
Glucose-Capillary: 155 mg/dL — ABNORMAL HIGH (ref 70–99)
Glucose-Capillary: 136 mg/dL — ABNORMAL HIGH (ref 70–99)
Glucose-Capillary: 130 mg/dL — ABNORMAL HIGH (ref 70–99)

## 2020-01-05 MED ORDER — PRO-STAT SUGAR FREE PO LIQD
60.0000 mL | Freq: Four times a day (QID) | ORAL | Status: DC
  Administered 2020-01-05 – 2020-01-07 (×7): 60 mL
  Filled 2020-01-05 (×6): qty 60

## 2020-01-05 MED ORDER — POTASSIUM CHLORIDE 20 MEQ/15ML (10%) PO SOLN
20.0000 meq | ORAL | Status: AC
  Administered 2020-01-05 (×2): 20 meq
  Filled 2020-01-05: qty 15

## 2020-01-05 MED ORDER — FUROSEMIDE 10 MG/ML IJ SOLN
20.0000 mg | Freq: Once | INTRAMUSCULAR | Status: AC
  Administered 2020-01-05: 20 mg via INTRAVENOUS
  Filled 2020-01-05: qty 2

## 2020-01-05 MED ORDER — PIVOT 1.5 CAL PO LIQD
1000.0000 mL | ORAL | Status: DC
  Administered 2020-01-05 – 2020-01-06 (×2): 1000 mL
  Filled 2020-01-05 (×4): qty 1000

## 2020-01-05 NOTE — Progress Notes (Signed)
   01/05/20 1300  Hydrotherapy note  Subjective Assessment  Subjective pt on vent, mouthing words  Patient and Family Stated Goals unable to state/on vent sedated   Date of Onset  (abscess present on admission)  Prior Treatments s/p surgical I & D x2, diverting colostomy  Evaluation and Treatment  Evaluation and Treatment Procedures Explained to Patient/Family Yes (eyes open, does not follow)  Evaluation and Treatment Procedures Patient unable to consent due to mental status  Wound / Incision (Open or Dehisced) 12/27/19 Non-pressure wound Buttocks Left *PT ONLY* open wound L gluteal area  Date First Assessed/Time First Assessed: 12/27/19 1410   Wound Type: Non-pressure wound  Location: Buttocks  Location Orientation: Left  Wound Description (Comments): *PT ONLY* open wound L gluteal area  Present on Admission: (c)   Dressing Type Moist to dry;Paper tape;ABD  Dressing Changed Changed  Dressing Status Clean;Dry  Dressing Change Frequency PRN  % Wound base Red or Granulating 40%  % Wound base Yellow/Fibrinous Exudate 60%  Wound Length (cm) 17 cm  Wound Width (cm) 13 cm  Wound Depth (cm) 8 cm  Wound Volume (cm^3) 1768 cm^3  Wound Surface Area (cm^2) 221 cm^2  Margins Unattached edges (unapproximated)  Drainage Amount Copious  Drainage Description Serosanguineous  Non-staged Wound Description Full thickness  Treatment Hydrotherapy (Pulse lavage);Packing (Saline gauze)  Hydrotherapy  Pulsed lavage therapy - wound location L gluteal area   Pulsed Lavage with Suction (psi) 12 psi  Pulsed Lavage with Suction - Normal Saline Used 1000 mL  Pulsed Lavage Tip Tip with splash shield  Wound Therapy - Assess/Plan/Recommendations  Wound Therapy - Clinical Statement Pt with extensive open wound L gluteal area d/t necrotizing fasciitis, s/p surgical  I and D x2.  Surgical PA in, performed debridement . PT to continue PLS. Patient premedicated, remained awake, mouthing words which is very difficult  to read with ET tube/Patient in  weaning mode   Wound Therapy - Functional Problem List immobility, sedated on vent   Factors Delaying/Impairing Wound Healing Substance abuse;Immobility;Multiple medical problems  Hydrotherapy Plan Debridement;Dressing change;Patient/family education;Pulsatile lavage with suction  Wound Therapy - Frequency 6X / week  Wound Therapy - Current Recommendations PT  Wound Therapy - Follow Up Recommendations Skilled nursing facility (LTAC)  Wound Plan Pt will benefit from hydrotherapy as part a multi-modal/multi-disciplinary wound care to approach to facilitate healing and decr bioburden.   Wound Therapy Goals - Improve the function of patient's integumentary system by progressing the wound(s) through the phases of wound healing by:  Decrease Necrotic Tissue to 50  Decrease Necrotic Tissue - Progress Progressing toward goal  Increase Granulation Tissue to 50  Increase Granulation Tissue - Progress Progressing toward goal  Improve Drainage Characteristics Mod  Improve Drainage Characteristics - Progress Progressing toward goal  Goals/treatment plan/discharge plan were made with and agreed upon by patient/family No, Patient unable to participate in goals/treatment/discharge plan and family unavailable  Time For Goal Achievement Other (comment) (3 wks)  Wound Therapy - Potential for Goals Fair  [place] Pager [phone] Office [phone]

## 2020-01-05 NOTE — Progress Notes (Signed)
11 Days Post-Op  Subjective: CC: Seen with Hydro.   Objective: Vital signs in last 24 hours: Temp:  [98.3 F (36.8 C)-100.9 F (38.3 C)] 98.4 F (36.9 C) (06/21 0800) Pulse Rate:  [88-126] 88 (06/21 0900) Resp:  [21-38] 35 (06/21 0900) BP: (78-110)/(40-75) 110/64 (06/21 0900) SpO2:  [100 %] 100 % (06/21 0900) FiO2 (%):  [30 %] 30 % (06/21 0900) Last BM Date: 01/04/20  Intake/Output from previous day: 06/20 0701 - 06/21 0700 In: 2711.3 [I.V.:768.1; NG/GT:945; IV Piggyback:998.2] Out: 2825 [Urine:1900; Stool:925] Intake/Output this shift: Total I/O In: 140.2 [I.V.:99.5; IV Piggyback:40.7] Out: 125 [Urine:125]  PE: Skin: Wound measures 13cm x 11cm. The deepest portion of the wound measures 9cm. The wound edges with necrotic skin/adipose were debrided at bedside. No purulent drainage noted. Healthy granulation tissue at the base. See pictures below  Pre-beside debridement   Post-bedside debridement  i      Lab Results:  Recent Labs    01/04/20 0451 01/05/20 0446  WBC 13.3* 11.4*  HGB 7.4* 7.5*  HCT 25.1* 25.5*  PLT 402* 412*   BMET Recent Labs    01/04/20 0451 01/05/20 0446  NA 146* 150*  K 3.2* 3.4*  CL 119* 120*  CO2 22 22  GLUCOSE 174* 180*  BUN 44* 44*  CREATININE 0.48 0.42*  CALCIUM 8.1* 8.2*   PT/INR No results for input(s): LABPROT, INR in the last 72 hours. CMP     Component Value Date/Time   NA 150 (H) 01/05/2020 0446   K 3.4 (L) 01/05/2020 0446   CL 120 (H) 01/05/2020 0446   CO2 22 01/05/2020 0446   GLUCOSE 180 (H) 01/05/2020 0446   BUN 44 (H) 01/05/2020 0446   CREATININE 0.42 (L) 01/05/2020 0446   CALCIUM 8.2 (L) 01/05/2020 0446   PROT 5.9 (L) 01/02/2020 0459   ALBUMIN 1.4 (L) 01/02/2020 0459   AST 39 01/02/2020 0459   ALT 21 01/02/2020 0459   ALKPHOS 117 01/02/2020 0459   BILITOT 1.0 01/02/2020 0459   GFRNONAA >60 01/05/2020 0446   GFRAA >60 01/05/2020 0446   Lipase  No results found for:  LIPASE     Studies/Results: DG Chest 1 View  Result Date: 01/04/2020 CLINICAL DATA:  Repositioning of ET tube EXAM: CHEST  1 VIEW COMPARISON:  January 02, 2020 FINDINGS: The ETT is in good position. A new left PICC line terminates in the region of the caval atrial junction. The feeding tube terminates below today's film. Low lung volumes limit evaluation. Bilateral pulmonary infiltrates have improved in the interval. No change in the cardiomediastinal silhouette. The right hemidiaphragm remains elevated. No pneumothorax. IMPRESSION: 1. Support apparatus as above. 2. Decreasing bilateral pulmonary infiltrates. Recommend follow-up to complete resolution. Electronically Signed   By: Dorise Bullion III M.D   On: 01/04/2020 13:23   DG Chest Port 1 View  Result Date: 01/05/2020 CLINICAL DATA:  Acute respiratory failure EXAM: PORTABLE CHEST 1 VIEW COMPARISON:  Portable exam 0513 hours compared to 01/04/2020 FINDINGS: Tip of endotracheal tube projects 2.8 cm above carina. Feeding tube extends into stomach. LEFT arm PICC line tip projecting over RIGHT atrium, accentuated by hypoinflation. Normal heart size and mediastinal contours. Low lung volumes with bibasilar atelectasis. No pleural effusion or pneumothorax. IMPRESSION: Decreased lung volumes with bibasilar atelectasis. Electronically Signed   By: Lavonia Dana M.D.   On: 01/05/2020 08:20    Anti-infectives: Anti-infectives (From admission, onward)   Start     Dose/Rate Route Frequency Ordered Stop  12/31/19 1600  metroNIDAZOLE (FLAGYL) IVPB 500 mg     Discontinue     500 mg 100 mL/hr over 60 Minutes Intravenous Every 8 hours 12/31/19 1505     12/28/19 1400  vancomycin (VANCOCIN) IVPB 1000 mg/200 mL premix  Status:  Discontinued        1,000 mg 200 mL/hr over 60 Minutes Intravenous Every 24 hours 12/28/19 1005 12/28/19 1343   12/28/19 1400  linezolid (ZYVOX) IVPB 600 mg     Discontinue     600 mg 300 mL/hr over 60 Minutes Intravenous Every 12 hours  12/28/19 1343     12/26/19 1948  vancomycin variable dose per unstable renal function (pharmacist dosing)  Status:  Discontinued         Does not apply See admin instructions 12/26/19 1948 12/28/19 1005   12/23/19 2000  vancomycin (VANCOCIN) IVPB 1000 mg/200 mL premix  Status:  Discontinued        1,000 mg 200 mL/hr over 60 Minutes Intravenous Every 8 hours 12/23/19 1027 12/26/19 1944   12/23/19 1300  clindamycin (CLEOCIN) IVPB 900 mg  Status:  Discontinued        900 mg 100 mL/hr over 30 Minutes Intravenous Every 8 hours 12/23/19 1205 12/31/19 1505   12/23/19 1200  metroNIDAZOLE (FLAGYL) IVPB 500 mg  Status:  Discontinued        500 mg 100 mL/hr over 60 Minutes Intravenous Every 8 hours 12/23/19 1135 12/23/19 1254   12/23/19 1045  vancomycin (VANCOREADY) IVPB 2000 mg/400 mL        2,000 mg 200 mL/hr over 120 Minutes Intravenous NOW 12/23/19 1018 12/23/19 1441   12/23/19 0300  cefTRIAXone (ROCEPHIN) 2 g in sodium chloride 0.9 % 100 mL IVPB     Discontinue     2 g 200 mL/hr over 30 Minutes Intravenous Every 24 hours 12/23/19 0236         Assessment/Plan Acute hypoxic respiratory failure - Vent dependent  - possible aspiration 6/16 Hepatic encephalopathy Alcohol abuse Thrombocytopenia  HTN VDRF  Sepsis Necrotizing fasciitis of left buttock -S/Pincision and debridement of skin, subcutaneous tissue, fascia and muscle 15x15x6cm, left buttock5/8 Dr. 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 Colin Broach -Appreciate CCM assistance with her care - Ostomy functioning. WOCN following -Dressing changes TID currently. Continue Hydrotherapy and local wound care.  - Will discuss with MD total duration of abx.  - Debrided at bedside today. No indication for further OR at this time. Will see again on Thursday.   FEN: NPO, IVF,TFs ID: Rocephin,  Clindamycin,Linezolid, Flagyl VTE: SCD's Foley: placed 6/8   LOS: 15 days    Jillyn Ledger , [facility] 01/05/2020, 10:37 AM Please see Amion for pager number during day hours 7:00am-4:30pm

## 2020-01-05 NOTE — Consult Note (Signed)
[place] Nurse wound follow up Wound type: Pressure injury to right ear Measurement: 1.5cm x 0.5cm intact skin. Purple/red discoloration Wound bed:N/A Drainage (amount, consistency, odor) None Periwound:None Dressing procedure/placement/frequency: silicone foam, change every 3 days, assess under each shift for changes   [facility] Nurse ostomy follow up Stoma type/location: LLQ colostomy.   Stomal assessment/size: Oval, below skin level in deep gulley.  1 and 1/2 inch Peristomal assessment: intact  Treatment options for stomal/peristomal skin: skin barrier ring, soft convex pouch (1-piece)  Output: green seedy Ostomy pouching: 1pc.soft convex with skin barrier ring   Education provided: None, patient intubated  Enrolled patient in [employer] Discharge program: No, once patient able to participate in care.   Hydrotherapy in for treatment related to surgical wounds.   [place] Nurse team will follow with you and see patient within 10 days for wound assessments.  Please notify [place] nurses of any acute changes in the wounds or any new areas of concern [place] MSN, [place], [place], [place]

## 2020-01-05 NOTE — TOC Progression Note (Signed)
Transition of Care [facility]) - Progression Note    Patient Details  Name: Alyssa Carey MRN: [id number] Date of Birth: 1971/03/12  Transition of Care [facility]) CM/SW Contact  Leeroy Cha, RN Phone Number: 01/05/2020, 10:16 AM  Clinical Narrative:    Remains on full support on the vent.  Possible trach this week day 13 of the vent since intubation. Hx of etoh abuse, is postop from a debridement of the buttock area, Following for care, Financial advisor has been notified that patient has no insurance.  Plan follow for needs and give family support.   Expected Discharge Plan: [place] Barriers to Discharge: Continued Medical Work up  Expected Discharge Plan and Services Expected Discharge Plan: [place] In-house Referral: Development worker, community                                             Social Determinants of Health (SDOH) Interventions    Readmission Risk Interventions No flowsheet data found.

## 2020-01-05 NOTE — Progress Notes (Addendum)
Nutrition Follow-up  DOCUMENTATION CODES:   Obesity unspecified  INTERVENTION:  - will adjust TF regimen: Pivot 1.5 @ 40 ml/hr with 60 ml prostat QID and 1 packet juven BID. - this regimen will provide 2430 kcal, 210 grams protein, and 729 ml free water. - free water flush to continue to be per CCM or CCS.  - vitamin A result back: 4.3 ug/dl; will discuss with Pharmacist about repletion recommendation.   NUTRITION DIAGNOSIS:   Increased nutrient needs related to chronic illness, wound healing (alcohol abuse; necrotizing fasciitis) as evidenced by estimated needs. -ongoing  GOAL:   Patient will meet greater than or equal to 90% of their needs -met with TF regimen  MONITOR:   Vent status, TF tolerance, Labs, Weight trends, Skin  ASSESSMENT:   Pt with PMH of heavy alcohol history who for one week with decreased intake admitted 6/6 with alcoholic hepatitis with hepatic encephalopathy and AKI.  Significant Events: 6/7-admission; initial RD assessment 6/8-Rapid Response; CT pelvis to r/o necrotizing fascitis--found to be positive for necrotizing fascitis and went to OR for I&D 6/9-remained intubated with OGT in place; started TF; added ascorbic acid BID and zinc once/day.  6/10-return to OR for I&D; lap loop colostomy 6/16- vomiting overnight; TF held and OGT to LIS 6/17- vomiting overnight; OGT removed d/t aspiration event and being partly pulled; small bore NGT placed 6/18- restarted TF at trickle rate   Patient remains intubated with small bore NGT in place and is receiving TF regimen at goal rate: Pivot 1.5 @ 45 ml/hr with 60 ml prostat TID and 1 packet juven BID. This regimen is providing 2410 kcal, 191 grams protein, and 820 ml free water. Order in place for 200 ml free water QID (800 ml/day), but programmed into kangaroo pump for 200 ml every 9 hours.   She has not been weighed since 6/19. Weight that day was consistent with weight on 6/18.   Patient was discussed in  rounds this AM. No plan for return to OR at this time. Able to talk with RN who states that patient had been indicating earlier today that she had abdominal pain but d/t being on the vent was unable to provide detail/further info. RN reports that patient often does not have much output from her colostomy until she is turned on her side for bathing and then has a large amount of output at that time.   Per notes: - necrotizing fascitis with bedside debridement done 6/21 with no plan for return to OR at this time   Patient is currently intubated on ventilator support MV: 9.5 L/min Temp (24hrs), Avg:99.6 F (37.6 C), Min:98.3 F (36.8 C), Max:100.9 F (38.3 C) Propofol: none BP: 105/70 and MAP: 79  Medications reviewed; 500 mg ascorbic acid BID, 300 mg ferrous sulfate BID, 1 mg folvite/day, 20 mg IV lasix x1 dose 6/21, sliding scale novolog, 15 ml multivitamin/day, 40 mg protonix per tube BID, 17 g miralax/day, 20 mEq KCl per tube x2 doses 6/21, 100 mg thiamine/day, 50000 units drisdol x1 dose/7 days starting 6/17, 220 mg zinc sulfate/day.  Labs reviewed; CBGs: 155, 150, 155 mg/dl, Na: 150 mmol/l, K: 3.4 mmol/l, Cl: 120 mmol/l, BUN: 44 mg/dl, creatinine: 0.42 mg/dl, Ca: 8.2 mg/dl.  Drip; precedex @ 1.2 mcg/kg/hr.   Diet Order:   Diet Order            Diet NPO time specified  Diet effective now  EDUCATION NEEDS:   Not appropriate for education at this time  Skin:  Skin Assessment:  (necrotizing fasciitis to L buttock) Skin Integrity Issues:: DTI DTI: R ear Stage II: NA Unstageable: NA Incisions: L buttocks (6/8 + 6/10); abdomen (6/10)  Last BM:  6/20  Height:   Ht Readings from Last 1 Encounters:  01/01/20 5' 4"  (1.626 m)    Weight:   Wt Readings from Last 1 Encounters:  01/03/20 111.3 kg    Estimated Nutritional Needs:  Kcal:  2200-2400 Protein:  180-195 grams Fluid:  >/= 2 L/day     Jarome Matin, MS, RD, LDN, CNSC Inpatient Clinical  Dietitian RD pager # available in AMION  After hours/weekend pager # available in [facility]

## 2020-01-05 NOTE — Progress Notes (Addendum)
NAME:  Alyssa Carey, MRN:  [id number], DOB:  1970-07-24, LOS: 71 ADMISSION DATE:  12/21/2019, CONSULTATION DATE:  6/8 REFERRING MD:  Kshitiz, CHIEF COMPLAINT:  Confusion   Brief History   50 y/o female with a heavy alcohol history admitted in the setting of confusion and presumed alcohol withdrawal who later developed sepsis from a gas forming wound.    Past Medical History  Alcohol abuse  Significant Hospital Events   6/06 admission 6/08 ICU admission to OR ofr I&D, back intubated and on pressors, 6/09 started tube feeds. Still pressor dependent.kept on vent w/ anticipated return to OR planned  6/10 Back to the OR for significant debridement and also placement of diverting colostomy 6/11 Sedated, low-dose phenylephrine. CCS felt wound looking good.  Sedation changed to precedex / PRN fent  6/12 more awake, on 15/5 pressure support 6/13 family discussion >> DNR, continue medical care 6//15 transfuse 1 unit PRBC; vomiting, tube feeds held 6/16 Vomiting overnight 6/17 Trial PSV 15/5 with variable Vt, remains on vasopressors.  Vomiting overnight  6/18 transfuse 1 unit PRBC 6/20 Off pressors,  On precedex and weaning on PSV  6/21 Weaning on PSV, planned hydrotherapy   Consults:  General surgery  Procedures:  ETT 6/8 >> Right IJ CVL 6/8 >>6/18  PIC  LUE  6/18   Significant Diagnostic Tests:   6/6 CT head > NAICP  6/7 U/S abdomen > hepatic steatosis  6/8 CT Pelvis > gas throughout medial subcutaneous soft tissues of left buttock into the left perineum  Micro Data:  6/06 SARS COV 2 > negative 6/08 blood > negative 6/08 Lt buttocks wound > Proteus, E coli (resistant to ampicillin, intermediate to unasyn), VRE 6/13 blood > negative  Antimicrobials:  Rocephin 6/07 >> Clindamycin 6/08 >> 6/16 Vancomycin 6/08 >> 6/11 Flagyl 6/08 >>  Linezolid 6/13 >>   Interim history/subjective:  Tmax 100.9  Glucose range 115 to 180 I/O 1.9L UOP, -113 ml in last 24 h  Objective     Blood pressure 110/64, pulse 88, temperature (!) 100.4 F (38 C), temperature source Axillary, resp. rate (!) 35, height 5' 4"  (1.626 m), weight 111.3 kg, last menstrual period 03/23/2019, SpO2 100 %.    Vent Mode: CPAP;PSV FiO2 (%):  [30 %] 30 % Set Rate:  [16 bmp] 16 bmp Vt Set:  [440 mL] 440 mL PEEP:  [5 cmH20] 5 cmH20 Pressure Support:  [5 cmH20] 5 cmH20 Plateau Pressure:  [21 cmH20-27 cmH20] 21 cmH20   Intake/Output Summary (Last 24 hours) at 01/05/2020 2956 Last data filed at 01/05/2020 0900 Gross per 24 hour  Intake 2712.06 ml  Output 2950 ml  Net -237.94 ml   Filed Weights   01/01/20 0800 01/02/20 0459 01/03/20 0500  Weight: 108.2 kg 111.6 kg 111.3 kg    Examination: General: critically ill appearing adult female lying in bed in NAD on vent  HEENT: MM pink/moist, ETT Neuro: Awake, alert, nods, generalized weakness  CV: s1s2 rrr, no m/r/g PULM: mild tachypnea, non-labored on PSV, Vt ~ 250-300 GI: soft, bsx4 active, tolerating TF  Extremities: warm/dry, 2+ generalized pitting edema  Skin: no rashes or lesions  Resolved Hospital Problem list   Non gap metabolic acidosis, thrombocytopenia in 2nd to sepsis and alcohol abuse, hepatic encephalopathy, Elevated LFTs from acute alcoholic hepatitis with fatty liver  Assessment & Plan:   Septic shock from necrotizing fasciitis of Lt buttock s/p debridement. Proteus, E coli, VRE in wound culture.  Off pressors since 6/19.  -continue  linezolid, flagyl  -continue hydrotherapy  -post operative care, further surgical recommendations per CCS  Acute hypoxic respiratory failure in setting of septic shock. -PRVC as rest mode ventilation  -Daily SBT / WUA, PSV as tolerated  -wean PEEP / fiO2 for sats >90% -Suspect will need trach, RN notes family may not be in support of trach  -follow intermittent CXR -low dose lasix x1  Vomiting on 6/16, 6/17. Resolved  6/18 -TF per Nutrition  -Zofran   Acute metabolic encephalopathy 2nd  to sepsis. -RASS goal 0 to -1  -minimize sedating medications as able  -delirium prevention measures  Hypokalemia  -follow electrolytes, replace as indicated   -KCL 6/21   Hyperglycemia. No hx DM  -SSI    Anemia of critical illness and chronic disease. PRBC 6/18 -follow CBC  -transfuse for Hgb <7% or active bleeding   Best practice:  Nutrition: tube feeding Sedation: Precedex  SUP: protonix DVT prophylaxis :sq hep restarted 6/19 as refuses PAS Code status: DNR Disposition: ICU  Labs:   CMP Latest Ref Rng & Units 01/05/2020 01/04/2020 01/03/2020  Glucose 70 - 99 mg/dL 180(H) 174(H) 167(H)  BUN 6 - 20 mg/dL 44(H) 44(H) 51(H)  Creatinine 0.44 - 1.00 mg/dL 0.42(L) 0.48 0.55  Sodium 135 - 145 mmol/L 150(H) 146(H) 147(H)  Potassium 3.5 - 5.1 mmol/L 3.4(L) 3.2(L) 3.5  Chloride 98 - 111 mmol/L 120(H) 119(H) 117(H)  CO2 22 - 32 mmol/L 22 22 21(L)  Calcium 8.9 - 10.3 mg/dL 8.2(L) 8.1(L) 8.5(L)  Total Protein 6.5 - 8.1 g/dL - - -  Total Bilirubin 0.3 - 1.2 mg/dL - - -  Alkaline Phos 38 - 126 U/L - - -  AST 15 - 41 U/L - - -  ALT 0 - 44 U/L - - -    CBC Latest Ref Rng & Units 01/05/2020 01/04/2020 01/03/2020  WBC 4.0 - 10.5 K/uL 11.4(H) 13.3(H) 19.6(H)  Hemoglobin 12.0 - 15.0 g/dL 7.5(L) 7.4(L) 8.4(L)  Hematocrit 36 - 46 % 25.5(L) 25.1(L) 27.2(L)  Platelets 150 - 400 K/uL 412(H) 402(H) 439(H)    ABG    Component Value Date/Time   PHART 7.394 12/27/2019 0355   PCO2ART 28.4 (L) 12/27/2019 0355   PO2ART 128 (H) 12/27/2019 0355   HCO3 16.6 (L) 12/27/2019 0355   TCO2 17 (L) 12/25/2019 1726   ACIDBASEDEF 6.6 (H) 12/27/2019 0355   O2SAT 98.6 12/27/2019 0355    CBG (last 3)  Recent Labs    01/04/20 2319 01/05/20 0406 01/05/20 0826  GLUCAP 169* 155* 150*      CC Time: 82 minutes   Noe Gens, MSN, NP-C [facility] 01/05/2020, 9:53 AM   Please see [email or web address] for pager details.

## 2020-01-06 LAB — BASIC METABOLIC PANEL
Anion gap: 5 (ref 5–15)
BUN: 44 mg/dL — ABNORMAL HIGH (ref 6–20)
CO2: 24 mmol/L (ref 22–32)
Calcium: 8.3 mg/dL — ABNORMAL LOW (ref 8.9–10.3)
Chloride: 123 mmol/L — ABNORMAL HIGH (ref 98–111)
Creatinine, Ser: 0.34 mg/dL — ABNORMAL LOW (ref 0.44–1.00)
GFR calc Af Amer: >60 mL/min (ref >60)
GFR calc non Af Amer: >60 mL/min (ref >60)
Glucose, Bld: 161 mg/dL — ABNORMAL HIGH (ref 70–99)
Potassium: 3.2 mmol/L — ABNORMAL LOW (ref 3.5–5.1)
Sodium: 152 mmol/L — ABNORMAL HIGH (ref 135–145)

## 2020-01-06 LAB — GLUCOSE, CAPILLARY
Glucose-Capillary: 136 mg/dL — ABNORMAL HIGH (ref 70–99)
Glucose-Capillary: 140 mg/dL — ABNORMAL HIGH (ref 70–99)
Glucose-Capillary: 146 mg/dL — ABNORMAL HIGH (ref 70–99)
Glucose-Capillary: 172 mg/dL — ABNORMAL HIGH (ref 70–99)
Glucose-Capillary: 187 mg/dL — ABNORMAL HIGH (ref 70–99)
Glucose-Capillary: 193 mg/dL — ABNORMAL HIGH (ref 70–99)

## 2020-01-06 LAB — CBC
HCT: 24.1 % — ABNORMAL LOW (ref 36.0–46.0)
Hemoglobin: 7.1 g/dL — ABNORMAL LOW (ref 12.0–15.0)
MCH: 33.2 pg (ref 26.0–34.0)
MCHC: 29.5 g/dL — ABNORMAL LOW (ref 30.0–36.0)
MCV: 112.6 fL — ABNORMAL HIGH (ref 80.0–100.0)
Platelets: 364 10*3/uL (ref 150–400)
RBC: 2.14 MIL/uL — ABNORMAL LOW (ref 3.87–5.11)
RDW: 22.9 % — ABNORMAL HIGH (ref 11.5–15.5)
WBC: 10.8 10*3/uL — ABNORMAL HIGH (ref 4.0–10.5)
nRBC: 0.2 % (ref 0.0–0.2)

## 2020-01-06 MED ORDER — POTASSIUM CHLORIDE 20 MEQ/15ML (10%) PO SOLN
40.0000 meq | ORAL | Status: AC
  Administered 2020-01-06 (×2): 40 meq
  Filled 2020-01-06 (×2): qty 30

## 2020-01-06 MED ORDER — FREE WATER
200.0000 mL | Status: DC
  Administered 2020-01-06 – 2020-01-07 (×6): 200 mL

## 2020-01-06 MED ORDER — METOPROLOL TARTRATE 5 MG/5ML IV SOLN
INTRAVENOUS | Status: AC
  Filled 2020-01-06: qty 5

## 2020-01-06 MED ORDER — FUROSEMIDE 10 MG/ML IJ SOLN
40.0000 mg | Freq: Once | INTRAMUSCULAR | Status: AC
  Administered 2020-01-06: 40 mg via INTRAVENOUS
  Filled 2020-01-06: qty 4

## 2020-01-06 NOTE — Progress Notes (Signed)
   01/06/20 1500 Hydrotherapy note.  Subjective Assessment  Subjective pt on vent, mouthing words  Patient and Family Stated Goals unable to state/on vent sedated   Date of Onset  (abscess present on admission)  Prior Treatments s/p surgical I & D x2, diverting colostomy  Evaluation and Treatment  Evaluation and Treatment Procedures Explained to Patient/Family Yes (eyes open, does not follow)  Evaluation and Treatment Procedures Patient unable to consent due to mental status  Wound / Incision (Open or Dehisced) 12/27/19 Non-pressure wound Buttocks Left *PT ONLY* open wound L gluteal area  Date First Assessed/Time First Assessed: 12/27/19 1410   Wound Type: Non-pressure wound  Location: Buttocks  Location Orientation: Left  Wound Description (Comments): *PT ONLY* open wound L gluteal area  Present on Admission: (c)   Dressing Type Foam - Lift dressing to assess site every shift;Moist to moist;ABD  Dressing Changed New  Dressing Status Clean;Dry  Dressing Change Frequency PRN  % Wound base Red or Granulating 50%  % Wound base Yellow/Fibrinous Exudate 25%  % Wound base Other/Granulation Tissue (Comment) 25% (necrotic fat)  Peri-wound Assessment Erythema (blanchable)  Wound Length (cm) 17 cm  Wound Width (cm) 13 cm  Wound Depth (cm) 8 cm  Wound Volume (cm^3) 1768 cm^3  Wound Surface Area (cm^2) 221 cm^2  Margins Unattached edges (unapproximated)  Drainage Amount Copious  Drainage Description Serosanguineous;Odor  Non-staged Wound Description Full thickness  Treatment Hydrotherapy (Pulse lavage);Packing (Saline gauze)  Hydrotherapy  Pulsed lavage therapy - wound location L gluteal area   Pulsed Lavage with Suction (psi) 12 psi  Pulsed Lavage with Suction - Normal Saline Used 1000 mL  Pulsed Lavage Tip Tip with splash shield  Wound Therapy - Assess/Plan/Recommendations  Wound Therapy - Clinical Statement Pt with extensive open wound L gluteal area d/t necrotizing fasciitis, s/p  surgical  I and D x2.  Patient on vent, PEEP 5, 30%. Patient  restless, HR recently in 180'. Med s given, up to 130 during treatment. BP 69/39. RN aware. Hydrotherapy benefits reachinga max benefit . will discuss with surgical for decreasing to 3 x week vs  Discontinuation.  Wound Therapy - Functional Problem List immobility, sedated on vent   Factors Delaying/Impairing Wound Healing Substance abuse;Immobility;Multiple medical problems  Hydrotherapy Plan Debridement;Dressing change;Patient/family education;Pulsatile lavage with suction  Wound Therapy - Frequency 6X / week  Wound Therapy - Current Recommendations PT (when less sedated)  Wound Therapy - Follow Up Recommendations Skilled nursing facility (LTAC)  Wound Plan Pt will benefit from hydrotherapy as part a multi-modal/multi-disciplinary wound care to approach to facilitate healing and decr bioburden.   Wound Therapy Goals - Improve the function of patient's integumentary system by progressing the wound(s) through the phases of wound healing by:  Decrease Necrotic Tissue to 75  Decrease Necrotic Tissue - Progress Progressing toward goal  Increase Granulation Tissue to 25  Increase Granulation Tissue - Progress Progressing toward goal  Improve Drainage Characteristics Mod  Improve Drainage Characteristics - Progress Progressing toward goal  Goals/treatment plan/discharge plan were made with and agreed upon by patient/family No, Patient unable to participate in goals/treatment/discharge plan and family unavailable  Time For Goal Achievement Other (comment) (3 wks)  Wound Therapy - Potential for Goals Fair  [place] Pager [phone] Office [phone]

## 2020-01-06 NOTE — Evaluation (Signed)
Physical Therapy Evaluation Patient Details Name: Alyssa Carey MRN: [id number] DOB: [birth year] Today's Date: 01/06/2020   History of Present Illness  49 y/o female with a heavy alcohol history admitted6/8/21  in the setting of confusion and presumed alcohol withdrawal who later developed sepsis from a gas forming wound, S/P debridement left gluteal wound for necratizing fascitis.VDRF,  Clinical Impression  Patient seen post hydrotherapy. Patient is restless, trying to roll back to back. Noted spontaneous /vilitional UE  Activity. Minimal moving legs bit does move ankles and assisted with knee flexion, L >R. Patient mouthing words, has ETT in place and difficult to read lips. Patient's  Mobility goals are limited with large open buttock wound on Left. Continue  PT for AAROM/exercises at this time. Pt admitted with above diagnosis.  Pt currently with functional limitations due to the deficits listed below (see PT Problem List). Pt will benefit from skilled PT to increase their independence and safety with mobility to allow discharge to the venue listed below.       Follow Up Recommendations SNF    Equipment Recommendations  None recommended by PT    Recommendations for Other Services       Precautions / Restrictions Precautions Precaution Comments: left gluteal wound, on vent, gets restless, ETT      Mobility  Bed Mobility Overal bed mobility: Needs Assistance Bed Mobility: Rolling Rolling: +2 for physical assistance;Total assist         General bed mobility comments: much effort to roll, patient rsisting, reaching behind which is resisting rolling to each side.  Transfers                 General transfer comment: unable due to large wound  Ambulation/Gait                Stairs            Wheelchair Mobility    Modified Rankin (Stroke Patients Only)       Balance                                             Pertinent  Vitals/Pain Pain Assessment: Faces Faces Pain Scale: Hurts even more Pain Location: restless and swinging arms, trying to roll over Pain Descriptors / Indicators: Discomfort Pain Intervention(s): Monitored during session;Premedicated before session;Repositioned    Home Living Family/patient expects to be discharged to:: Private residence                 Additional Comments: no family to gather info    Prior Function Level of Independence: Independent               Hand Dominance        Extremity/Trunk Assessment   Upper Extremity Assessment Upper Extremity Assessment: LUE deficits/detail;RUE deficits/detail RUE Deficits / Details: raISING ARM FROM BED. REACHES IN ALL DIRECTIONS, mits in place. LUE Deficits / Details: similar to  right but moves more    Lower Extremity Assessment Lower Extremity Assessment: LLE deficits/detail;RLE deficits/detail RLE Deficits / Details: wiggles toes, did not attempt to lift leg, rolls leg LLE Deficits / Details: moves foot flexes knee and hip with mod assist.       Communication      Cognition Arousal/Alertness: Awake/alert Behavior During Therapy: Restless Overall Cognitive Status: Difficult to assess  General Comments: mouthing words" Something in my mouth"      General Comments      Exercises     Assessment/Plan    PT Assessment Patient needs continued PT services  PT Problem List Decreased strength;Decreased cognition;Decreased knowledge of precautions;Decreased mobility;Decreased range of motion;Cardiopulmonary status limiting activity;Decreased activity tolerance;Decreased skin integrity       PT Treatment Interventions Therapeutic activities;Cognitive remediation;Therapeutic exercise;Patient/family education;Functional mobility training    PT Goals (Current goals can be found in the Care Plan section)  Acute Rehab PT Goals PT Goal Formulation: Patient unable to  participate in goal setting Time For Goal Achievement: 01/20/20 Potential to Achieve Goals: Fair    Frequency Min 2X/week   Barriers to discharge        Co-evaluation               AM-PAC PT "6 Clicks" Mobility  Outcome Measure Help needed turning from your back to your side while in a flat bed without using bedrails?: Total Help needed moving from lying on your back to sitting on the side of a flat bed without using bedrails?: Total Help needed moving to and from a bed to a chair (including a wheelchair)?: Total Help needed standing up from a chair using your arms (e.g., wheelchair or bedside chair)?: Total Help needed to walk in hospital room?: Total Help needed climbing 3-5 steps with a railing? : Total 6 Click Score: 6    End of Session   Activity Tolerance: Treatment limited secondary to medical complications (Comment) Patient left: in bed;with call bell/phone within reach Nurse Communication: Mobility status (BP 69/39) PT Visit Diagnosis: Muscle weakness (generalized) (M62.81)    Time: 0051-1021 PT Time Calculation (min) (ACUTE ONLY): 15 min   Charges:   PT Evaluation $PT Eval Low Complexity: [place] Pager [phone] Office [phone]   Claretha Cooper 01/06/2020, 3:37 PM

## 2020-01-06 NOTE — Progress Notes (Signed)
NAME:  Alyssa Carey, MRN:  [id number], DOB:  1971-06-20, LOS: 97 ADMISSION DATE:  12/21/2019, CONSULTATION DATE:  6/8 REFERRING MD:  Kshitiz, CHIEF COMPLAINT:  Confusion   Brief History   49 y/o female with a heavy alcohol history admitted in the setting of confusion and presumed alcohol withdrawal who later developed sepsis from a gas forming wound.    Past Medical History  Alcohol abuse  Significant Hospital Events   6/06 admission 6/08 ICU admission to OR ofr I&D, back intubated and on pressors, 6/09 started tube feeds. Still pressor dependent.kept on vent w/ anticipated return to OR planned  6/10 Back to the OR for significant debridement and also placement of diverting colostomy 6/11 Sedated, low-dose phenylephrine. CCS felt wound looking good.  Sedation changed to precedex / PRN fent  6/12 more awake, on 15/5 pressure support 6/13 family discussion >> DNR, continue medical care 6//15 transfuse 1 unit PRBC; vomiting, tube feeds held 6/16 Vomiting overnight 6/17 Trial PSV 15/5 with variable Vt, remains on vasopressors.  Vomiting overnight  6/18 transfuse 1 unit PRBC 6/20 Off pressors,  On precedex and weaning on PSV  6/21 Weaning on PSV, planned hydrotherapy   Consults:  General surgery  Procedures:  ETT 6/8 >> Right IJ CVL 6/8 >>6/18  PIC  LUE  6/18   Significant Diagnostic Tests:   6/6 CT head > NAICP  6/7 U/S abdomen > hepatic steatosis  6/8 CT Pelvis > gas throughout medial subcutaneous soft tissues of left buttock into the left perineum  Micro Data:  6/06 SARS COV 2 > negative 6/08 blood > negative 6/08 Lt buttocks wound > Proteus, E coli (resistant to ampicillin, intermediate to unasyn), VRE 6/13 blood > negative  Antimicrobials:  Rocephin 6/07 >> Clindamycin 6/08 >> 6/16 Vancomycin 6/08 >> 6/11 Ceftriaxone 6/8 >> 6/22 Flagyl 6/08 >> 6/22 Linezolid 6/13 >>   Interim history/subjective:  RN reports no acute events, pt remains on precedex gtt.   Received fentanyl for wound care Tmax 100.4 / WBC 10.8 Glucose range 136-193 I/O 1.4L UOP, +192 ml in last 24 hours   Objective   Blood pressure (!) 101/56, pulse 82, temperature (!) 100.4 F (38 C), temperature source Axillary, resp. rate (!) 27, height 5' 4"  (1.626 m), weight 111.3 kg, last menstrual period 03/23/2019, SpO2 100 %.    Vent Mode: PSV;CPAP FiO2 (%):  [30 %] 30 % Set Rate:  [16 bmp] 16 bmp Vt Set:  [440 mL] 440 mL PEEP:  [5 cmH20] 5 cmH20 Pressure Support:  [5 cmH20] 5 cmH20 Plateau Pressure:  [23 cmH20-24 cmH20] 24 cmH20   Intake/Output Summary (Last 24 hours) at 01/06/2020 7342 Last data filed at 01/06/2020 0645 Gross per 24 hour  Intake 2400.79 ml  Output 2150 ml  Net 250.79 ml   Filed Weights   01/01/20 0800 01/02/20 0459 01/03/20 0500  Weight: 108.2 kg 111.6 kg 111.3 kg    Examination: General: ill appearing female lying in bed on vent in NAD  HEENT: MM pink/moist, ETT, anicteric  Neuro: Alert/awake, nods/follows commands, able to lift head off pillow  CV: s1s2 rrr, no m/r/g PULM: non-labored on vent, clear breath sounds  GI: soft, bsx4 active  Extremities: warm/dry, 2+ pitting edema  Skin: no rashes or lesions  Resolved Hospital Problem list   Non gap metabolic acidosis, thrombocytopenia in 2nd to sepsis and alcohol abuse, hepatic encephalopathy, Elevated LFTs from acute alcoholic hepatitis with fatty liver, Vomiting (6/16, 6/17)  Assessment & Plan:  Septic shock from necrotizing fasciitis of Lt buttock s/p debridement. Proteus, E coli, VRE in wound culture.  Off pressors since 6/19.  -stop flagyl, rocephin (D15), stop linezolid after 10 days  -continue wound care / hydrotherapy per CCS  Acute hypoxic respiratory failure in setting of septic shock. -PRVC 8cc/kg as rest mode  -daily PSV with goal for possible extubation  -PEEP / FiO2 to supports sats >90% -follow intermittent CXR  -lasix 40 mg IV x1  -will need to discuss trach with son,  message left for return call 6/22  At Risk Malnutrition  -TF per Nutrition   Acute metabolic encephalopathy 2nd to sepsis. ETOH Abuse Hx  -RASS Goal 0 to -1  -minimize sedating medications  -delirium prevention measures   Hypokalemia  Hypernatremia  -KCL 40 mEq PT Q4 x2 doses  -free water increased to 200 Q4 PT  -follow up electrolytes   Hyperglycemia. No hx DM  -SSI, very sensitive scale   Anemia of critical illness and chronic disease. PRBC 6/18 -follow CBC -transfuse for Hgb <7% or active bleeding   Best practice:  Nutrition: tube feeding Sedation: Precedex  SUP: protonix DVT prophylaxis: SQ Heparin  Code status: DNR Disposition: ICU  Labs:   CMP Latest Ref Rng & Units 01/06/2020 01/05/2020 01/04/2020  Glucose 70 - 99 mg/dL 161(H) 180(H) 174(H)  BUN 6 - 20 mg/dL 44(H) 44(H) 44(H)  Creatinine 0.44 - 1.00 mg/dL 0.34(L) 0.42(L) 0.48  Sodium 135 - 145 mmol/L 152(H) 150(H) 146(H)  Potassium 3.5 - 5.1 mmol/L 3.2(L) 3.4(L) 3.2(L)  Chloride 98 - 111 mmol/L 123(H) 120(H) 119(H)  CO2 22 - 32 mmol/L 24 22 22   Calcium 8.9 - 10.3 mg/dL 8.3(L) 8.2(L) 8.1(L)  Total Protein 6.5 - 8.1 g/dL - - -  Total Bilirubin 0.3 - 1.2 mg/dL - - -  Alkaline Phos 38 - 126 U/L - - -  AST 15 - 41 U/L - - -  ALT 0 - 44 U/L - - -    CBC Latest Ref Rng & Units 01/06/2020 01/05/2020 01/04/2020  WBC 4.0 - 10.5 K/uL 10.8(H) 11.4(H) 13.3(H)  Hemoglobin 12.0 - 15.0 g/dL 7.1(L) 7.5(L) 7.4(L)  Hematocrit 36 - 46 % 24.1(L) 25.5(L) 25.1(L)  Platelets 150 - 400 K/uL 364 412(H) 402(H)    ABG    Component Value Date/Time   PHART 7.394 12/27/2019 0355   PCO2ART 28.4 (L) 12/27/2019 0355   PO2ART 128 (H) 12/27/2019 0355   HCO3 16.6 (L) 12/27/2019 0355   TCO2 17 (L) 12/25/2019 1726   ACIDBASEDEF 6.6 (H) 12/27/2019 0355   O2SAT 98.6 12/27/2019 0355    CBG (last 3)  Recent Labs    01/06/20 0029 01/06/20 0428 01/06/20 0755  GLUCAP 193* 136* 146*      CC Time: 58 minutes   Noe Gens, MSN,  NP-C [facility] 01/06/2020, 8:52 AM   Please see [email or web address] for pager details.

## 2020-01-07 ENCOUNTER — Inpatient Hospital Stay (HOSPITAL_COMMUNITY): Payer: Medicaid Other

## 2020-01-07 LAB — BASIC METABOLIC PANEL
Anion gap: 4 — ABNORMAL LOW (ref 5–15)
BUN: 36 mg/dL — ABNORMAL HIGH (ref 6–20)
CO2: 24 mmol/L (ref 22–32)
Calcium: 7.9 mg/dL — ABNORMAL LOW (ref 8.9–10.3)
Chloride: 123 mmol/L — ABNORMAL HIGH (ref 98–111)
Creatinine, Ser: 0.32 mg/dL — ABNORMAL LOW (ref 0.44–1.00)
GFR calc Af Amer: >60 mL/min (ref >60)
GFR calc non Af Amer: >60 mL/min (ref >60)
Glucose, Bld: 172 mg/dL — ABNORMAL HIGH (ref 70–99)
Potassium: 3.4 mmol/L — ABNORMAL LOW (ref 3.5–5.1)
Sodium: 151 mmol/L — ABNORMAL HIGH (ref 135–145)

## 2020-01-07 LAB — GLUCOSE, CAPILLARY
Glucose-Capillary: 110 mg/dL — ABNORMAL HIGH (ref 70–99)
Glucose-Capillary: 121 mg/dL — ABNORMAL HIGH (ref 70–99)
Glucose-Capillary: 130 mg/dL — ABNORMAL HIGH (ref 70–99)
Glucose-Capillary: 149 mg/dL — ABNORMAL HIGH (ref 70–99)
Glucose-Capillary: 152 mg/dL — ABNORMAL HIGH (ref 70–99)
Glucose-Capillary: 155 mg/dL — ABNORMAL HIGH (ref 70–99)
Glucose-Capillary: 162 mg/dL — ABNORMAL HIGH (ref 70–99)

## 2020-01-07 LAB — CBC
HCT: 24.1 % — ABNORMAL LOW (ref 36.0–46.0)
Hemoglobin: 7 g/dL — ABNORMAL LOW (ref 12.0–15.0)
MCH: 32.7 pg (ref 26.0–34.0)
MCHC: 29 g/dL — ABNORMAL LOW (ref 30.0–36.0)
MCV: 112.6 fL — ABNORMAL HIGH (ref 80.0–100.0)
Platelets: 369 10*3/uL (ref 150–400)
RBC: 2.14 MIL/uL — ABNORMAL LOW (ref 3.87–5.11)
RDW: 23.2 % — ABNORMAL HIGH (ref 11.5–15.5)
WBC: 12.1 10*3/uL — ABNORMAL HIGH (ref 4.0–10.5)
nRBC: 0.2 % (ref 0.0–0.2)

## 2020-01-07 MED ORDER — POTASSIUM CHLORIDE 20 MEQ/15ML (10%) PO SOLN
40.0000 meq | Freq: Once | ORAL | Status: AC
  Administered 2020-01-07: 40 meq
  Filled 2020-01-07: qty 30

## 2020-01-07 MED ORDER — ORAL CARE MOUTH RINSE
15.0000 mL | Freq: Two times a day (BID) | OROMUCOSAL | Status: DC
  Administered 2020-01-07 – 2020-01-20 (×22): 15 mL via OROMUCOSAL

## 2020-01-07 MED ORDER — FUROSEMIDE 10 MG/ML IJ SOLN
40.0000 mg | Freq: Once | INTRAMUSCULAR | Status: AC
  Administered 2020-01-07: 40 mg via INTRAVENOUS
  Filled 2020-01-07: qty 4

## 2020-01-07 MED ORDER — METOPROLOL TARTRATE 5 MG/5ML IV SOLN
5.0000 mg | Freq: Once | INTRAVENOUS | Status: AC
  Administered 2020-01-07: 5 mg via INTRAVENOUS
  Filled 2020-01-07: qty 5

## 2020-01-07 MED ORDER — FENTANYL CITRATE (PF) 100 MCG/2ML IJ SOLN
50.0000 ug | INTRAMUSCULAR | Status: DC | PRN
  Administered 2020-01-07 – 2020-01-09 (×6): 100 ug via INTRAVENOUS
  Administered 2020-01-10: 50 ug via INTRAVENOUS
  Administered 2020-01-10 – 2020-01-17 (×16): 100 ug via INTRAVENOUS
  Administered 2020-01-17: 50 ug via INTRAVENOUS
  Administered 2020-01-17 – 2020-01-19 (×8): 100 ug via INTRAVENOUS
  Administered 2020-01-19: 50 ug via INTRAVENOUS
  Administered 2020-01-19 – 2020-01-21 (×5): 100 ug via INTRAVENOUS
  Administered 2020-01-21: 50 ug via INTRAVENOUS
  Administered 2020-01-22: 100 ug via INTRAVENOUS
  Administered 2020-01-22 (×3): 50 ug via INTRAVENOUS
  Administered 2020-01-23 (×2): 100 ug via INTRAVENOUS
  Administered 2020-01-25 – 2020-01-26 (×3): 50 ug via INTRAVENOUS
  Filled 2020-01-07 (×49): qty 2

## 2020-01-07 MED ORDER — CHLORHEXIDINE GLUCONATE 0.12 % MT SOLN
15.0000 mL | Freq: Two times a day (BID) | OROMUCOSAL | Status: DC
  Administered 2020-01-07 – 2020-01-21 (×25): 15 mL via OROMUCOSAL
  Filled 2020-01-07 (×13): qty 15

## 2020-01-07 MED ORDER — DEXMEDETOMIDINE HCL IN NACL 200 MCG/50ML IV SOLN
0.0000 ug/kg/h | INTRAVENOUS | Status: DC
  Administered 2020-01-07: 0.4 ug/kg/h via INTRAVENOUS
  Administered 2020-01-08: 0.3 ug/kg/h via INTRAVENOUS
  Filled 2020-01-07 (×3): qty 50

## 2020-01-07 NOTE — Progress Notes (Signed)
[facility] Progress Note Patient Name: Alyssa Carey DOB: 06/16/1971 MRN: [id number]   Date of Service  01/07/2020  HPI/Events of Note  Anxiety with tachycardia and tachypnea  eICU Interventions  Precedex infusion as needed.        Kerry Kass [name] 01/07/2020, 10:26 PM

## 2020-01-07 NOTE — Progress Notes (Signed)
   01/07/20 1600 Hydrotherapy note  Subjective Assessment  Subjective patient extubated, muthing word, writing notes asking what is next?  Patient and Family Stated Goals agreed to wound care today, nods yes   Date of Onset  (abscess present on admission)  Prior Treatments s/p surgical I & D x2, diverting colostomy  Evaluation and Treatment  Evaluation and Treatment Procedures Explained to Patient/Family Yes (eyes open, does not follow)  Evaluation and Treatment Procedures Patient unable to consent due to mental status  Wound / Incision (Open or Dehisced) 12/27/19 Non-pressure wound Buttocks Left *PT ONLY* open wound L gluteal area  Date First Assessed/Time First Assessed: 12/27/19 1410   Wound Type: Non-pressure wound  Location: Buttocks  Location Orientation: Left  Wound Description (Comments): *PT ONLY* open wound L gluteal area  Present on Admission: (c)   Dressing Type ABD;Moist to dry;Paper tape  Dressing Changed New  Dressing Status Clean  Dressing Change Frequency PRN  % Wound base Red or Granulating 50%  % Wound base Yellow/Fibrinous Exudate 25%  % Wound base Other/Granulation Tissue (Comment) 25%  Wound Length (cm) 17 cm  Wound Width (cm) 13 cm  Wound Depth (cm) 8 cm  Wound Volume (cm^3) 1768 cm^3  Wound Surface Area (cm^2) 221 cm^2  Margins Unattached edges (unapproximated)  Drainage Amount Copious  Drainage Description Serosanguineous (bloody,)  Non-staged Wound Description Full thickness  Treatment Debridement (Selective);Hydrotherapy (Pulse lavage);Packing (Saline gauze)  Hydrotherapy  Pulsed lavage therapy - wound location L gluteal area   Pulsed Lavage with Suction (psi) 12 psi  Pulsed Lavage with Suction - Normal Saline Used 1000 mL  Pulsed Lavage Tip Tip with splash shield  Selective Debridement  Selective Debridement - Location glte left  Selective Debridement - Tools Used Forceps;Scalpel  Selective Debridement - Tissue Removed necrotic fat  Wound Therapy -  Assess/Plan/Recommendations  Wound Therapy - Clinical Statement patient  is awake, writing note, extubated today. wound is bleeding, red granulation, hypergranulated. Area of necrotic at caudle wound, and at 6 to 1000.  Recommend to decrease to 3 x week  as most of wound externally is red granullation.  Wound Therapy - Functional Problem List immobility, sedated on vent   Factors Delaying/Impairing Wound Healing Substance abuse;Immobility;Multiple medical problems  Hydrotherapy Plan Debridement;Dressing change;Patient/family education;Pulsatile lavage with suction  Wound Therapy - Frequency 6X / week  Wound Therapy - Current Recommendations PT (when less sedated)  Wound Therapy - Follow Up Recommendations Skilled nursing facility ([facility])  Wound Plan Pt will benefit from hydrotherapy as part a multi-modal/multi-disciplinary wound care to approach to facilitate healing and decr bioburden.  Will ask to decrease to 3 x week  Wound Therapy Goals - Improve the function of patient's integumentary system by progressing the wound(s) through the phases of wound healing by:  Decrease Necrotic Tissue to 75  Decrease Necrotic Tissue - Progress Progressing toward goal  Increase Granulation Tissue to 25  Increase Granulation Tissue - Progress Progressing toward goal  Improve Drainage Characteristics Mod  Improve Drainage Characteristics - Progress Progressing toward goal  Goals/treatment plan/discharge plan were made with and agreed upon by patient/family No, Patient unable to participate in goals/treatment/discharge plan and family unavailable  Time For Goal Achievement Other (comment) (3 wks)  Wound Therapy - Potential for Goals Fair  [place] Pager [phone] Office [phone]

## 2020-01-07 NOTE — Progress Notes (Signed)
Notified Dr. Vaughan Browner of increased HR in the 130s and respiratory rate in the 30s. PRN lopressor dose ordered and given. Pt not in distress.

## 2020-01-07 NOTE — Evaluation (Addendum)
SLP Cancellation Note  Patient Details Name: Alyssa Carey MRN: [id number] DOB: 1970-11-28   Cancelled treatment:       Reason Eval/Treat Not Completed: Other (comment);Patient not medically ready (Order received for swallow evaluation.  Pt has been intubated since 6/8 and per notes for possible extubation today.  SLP will follow up next date for po and evaluation readiness.  Thanks for this consult.)  Kathleen Lime, MS [facility] SLP Acute Rehab Services Office [phone]   Macario Golds 01/07/2020, 11:04 AM

## 2020-01-07 NOTE — Procedures (Signed)
Extubation Procedure Note  Patient Details:   Name: Alyssa Carey DOB: Dec 05, 1970 MRN: [id number]   Airway Documentation:    Vent end date: 01/07/20 Vent end time: 1156   Evaluation  O2 sats: stable throughout Complications: No apparent complications Patient did tolerate procedure well. Bilateral Breath Sounds: Diminished, Clear   Yes  Johnette Abraham 01/07/2020, 11:56 AM

## 2020-01-07 NOTE — Progress Notes (Signed)
NAME:  Alyssa Carey, MRN:  [id number], DOB:  1970-11-19, LOS: 61 ADMISSION DATE:  12/21/2019, CONSULTATION DATE:  6/8 REFERRING MD:  Kshitiz, CHIEF COMPLAINT:  Confusion   Brief History   49 y/o female with a heavy alcohol history admitted in the setting of confusion and presumed alcohol withdrawal who later developed sepsis from a gas forming wound.    Past Medical History  Alcohol abuse  Significant Hospital Events   6/06 admission 6/08 ICU admission to OR ofr I&D, back intubated and on pressors, 6/09 started tube feeds. Still pressor dependent.kept on vent w/ anticipated return to OR planned  6/10 Back to the OR for significant debridement and also placement of diverting colostomy 6/11 Sedated, low-dose phenylephrine. CCS felt wound looking good.  Sedation changed to precedex / PRN fent  6/12 more awake, on 15/5 pressure support 6/13 family discussion >> DNR, continue medical care 6//15 transfuse 1 unit PRBC; vomiting, tube feeds held 6/16 Vomiting overnight 6/17 Trial PSV 15/5 with variable Vt, remains on vasopressors.  Vomiting overnight  6/18 transfuse 1 unit PRBC 6/20 Off pressors,  On precedex and weaning on PSV  6/21 Weaning on PSV, planned hydrotherapy   Consults:  General surgery  Procedures:  ETT 6/8 >> Right IJ CVL 6/8 >>6/18  PIC  LUE  6/18   Significant Diagnostic Tests:   6/6 CT head > NAICP  6/7 U/S abdomen > hepatic steatosis  6/8 CT Pelvis > gas throughout medial subcutaneous soft tissues of left buttock into the left perineum  Micro Data:  6/06 SARS COV 2 > negative 6/08 blood > negative 6/08 Lt buttocks wound > Proteus, E coli (resistant to ampicillin, intermediate to unasyn), VRE 6/13 blood > negative  Antimicrobials:  Rocephin 6/07 >> Clindamycin 6/08 >> 6/16 Vancomycin 6/08 >> 6/11 Ceftriaxone 6/8 >> 6/22 Flagyl 6/08 >> 6/22 Linezolid 6/13 >> 6/23  Interim history/subjective:  No acute events overnight  Pt weaning on PSV 5/5 Planned  hydrotherapy at 1pm  Tmax 101.3 in last 24 hours / WBC 12.1  I/O 1.7L UOP in last 24 hours, -1.2L in lst 24 hours   Objective   Blood pressure (!) 100/58, pulse 83, temperature 99 F (37.2 C), temperature source Axillary, resp. rate (!) 22, height 5' 4"  (1.626 m), weight 111.3 kg, last menstrual period 03/23/2019, SpO2 100 %.    Vent Mode: PSV;CPAP FiO2 (%):  [30 %] 30 % Set Rate:  [16 bmp] 16 bmp Vt Set:  [440 mL] 440 mL PEEP:  [5 cmH20] 5 cmH20 Pressure Support:  [5 cmH20] 5 cmH20 Plateau Pressure:  [21 cmH20-25 cmH20] 25 cmH20   Intake/Output Summary (Last 24 hours) at 01/07/2020 0920 Last data filed at 01/07/2020 0600 Gross per 24 hour  Intake 1003.45 ml  Output 2350 ml  Net -1346.55 ml   Filed Weights   01/01/20 0800 01/02/20 0459 01/03/20 0500  Weight: 108.2 kg 111.6 kg 111.3 kg    Examination: General: adult female lying in bed in NAD on vent  HEENT: MM pink/moist, ETT, anicteric Neuro: Awake, responds to questions with nod yes/no, MAE, follows commands  CV: s1s2 rrr, no m/r/g PULM: non-labored on vent, lungs bilaterally clear  GI: soft, bsx4 active  Extremities: warm/dry, generalized 1-2+ edema  Skin: no rashes or lesions  Resolved Hospital Problem list   Non gap metabolic acidosis, thrombocytopenia in 2nd to sepsis and alcohol abuse, hepatic encephalopathy, Elevated LFTs from acute alcoholic hepatitis with fatty liver, Vomiting (6/16, 6/17)  Assessment & Plan:  Septic shock from necrotizing fasciitis of Lt buttock s/p debridement. Proteus, E coli, VRE in wound culture.  Off pressors since 6/19.  -D10 linezolid, stop after 6/23 dosing  -continue wound care / hydrotherapy per CCS  Acute hypoxic respiratory failure in setting of septic shock. -PRVC 8cc/kg  -SBT with goal for extubation, would like to extubate 6/23 pending family decision regarding reintubation if fails  -FiO2 to support sats >90% -follow intermittent CXR  -repeat lasix 10m IV x1 + KCL   At  Risk Malnutrition  -TF per nutrition    Acute metabolic encephalopathy 2nd to sepsis. ETOH Abuse Hx  -PAD protocol with RASS Goal 0 to -1 -delirium prevention measures   Hypokalemia  Hypernatremia  -KCL 6/23  -continue free water PT  -monitor electrolytes, replace as indicated   Hyperglycemia. No hx DM  -SSI, very sensitive scale   Anemia of critical illness and chronic disease. PRBC 6/18 -trend CBC  -transfuse for Hgb <7% or active bleeding   Best practice:  Nutrition: tube feeding Sedation: Precedex  SUP: protonix DVT prophylaxis: SQ Heparin  Code status: DNR Disposition: ICU  Labs:   CMP Latest Ref Rng & Units 01/07/2020 01/06/2020 01/05/2020  Glucose 70 - 99 mg/dL 172(H) 161(H) 180(H)  BUN 6 - 20 mg/dL 36(H) 44(H) 44(H)  Creatinine 0.44 - 1.00 mg/dL 0.32(L) 0.34(L) 0.42(L)  Sodium 135 - 145 mmol/L 151(H) 152(H) 150(H)  Potassium 3.5 - 5.1 mmol/L 3.4(L) 3.2(L) 3.4(L)  Chloride 98 - 111 mmol/L 123(H) 123(H) 120(H)  CO2 22 - 32 mmol/L 24 24 22   Calcium 8.9 - 10.3 mg/dL 7.9(L) 8.3(L) 8.2(L)  Total Protein 6.5 - 8.1 g/dL - - -  Total Bilirubin 0.3 - 1.2 mg/dL - - -  Alkaline Phos 38 - 126 U/L - - -  AST 15 - 41 U/L - - -  ALT 0 - 44 U/L - - -    CBC Latest Ref Rng & Units 01/07/2020 01/06/2020 01/05/2020  WBC 4.0 - 10.5 K/uL 12.1(H) 10.8(H) 11.4(H)  Hemoglobin 12.0 - 15.0 g/dL 7.0(L) 7.1(L) 7.5(L)  Hematocrit 36 - 46 % 24.1(L) 24.1(L) 25.5(L)  Platelets 150 - 400 K/uL 369 364 412(H)    ABG    Component Value Date/Time   PHART 7.394 12/27/2019 0355   PCO2ART 28.4 (L) 12/27/2019 0355   PO2ART 128 (H) 12/27/2019 0355   HCO3 16.6 (L) 12/27/2019 0355   TCO2 17 (L) 12/25/2019 1726   ACIDBASEDEF 6.6 (H) 12/27/2019 0355   O2SAT 98.6 12/27/2019 0355    CBG (last 3)  Recent Labs    01/06/20 2358 01/07/20 0348 01/07/20 0800  GLUCAP 152* 149* 162*      CC Time: 34 minutes   BNoe Gens MSN, NP-C [facility] 01/07/2020, 9:20 AM    Please see [email or web address] for pager details.

## 2020-01-08 LAB — BASIC METABOLIC PANEL
Anion gap: 6 (ref 5–15)
BUN: 27 mg/dL — ABNORMAL HIGH (ref 6–20)
CO2: 26 mmol/L (ref 22–32)
Calcium: 8.2 mg/dL — ABNORMAL LOW (ref 8.9–10.3)
Chloride: 124 mmol/L — ABNORMAL HIGH (ref 98–111)
Creatinine, Ser: <0.3 mg/dL — ABNORMAL LOW (ref 0.44–1.00)
Glucose, Bld: 129 mg/dL — ABNORMAL HIGH (ref 70–99)
Potassium: 3.3 mmol/L — ABNORMAL LOW (ref 3.5–5.1)
Sodium: 156 mmol/L — ABNORMAL HIGH (ref 135–145)

## 2020-01-08 LAB — CBC
HCT: 22.4 % — ABNORMAL LOW (ref 36.0–46.0)
Hemoglobin: 6.4 g/dL — CL (ref 12.0–15.0)
MCH: 32.8 pg (ref 26.0–34.0)
MCHC: 28.6 g/dL — ABNORMAL LOW (ref 30.0–36.0)
MCV: 114.9 fL — ABNORMAL HIGH (ref 80.0–100.0)
Platelets: 387 10*3/uL (ref 150–400)
RBC: 1.95 MIL/uL — ABNORMAL LOW (ref 3.87–5.11)
RDW: 23.7 % — ABNORMAL HIGH (ref 11.5–15.5)
WBC: 12.4 10*3/uL — ABNORMAL HIGH (ref 4.0–10.5)
nRBC: 0.2 % (ref 0.0–0.2)

## 2020-01-08 LAB — PREPARE RBC (CROSSMATCH)

## 2020-01-08 LAB — GLUCOSE, CAPILLARY
Glucose-Capillary: 100 mg/dL — ABNORMAL HIGH (ref 70–99)
Glucose-Capillary: 106 mg/dL — ABNORMAL HIGH (ref 70–99)
Glucose-Capillary: 112 mg/dL — ABNORMAL HIGH (ref 70–99)
Glucose-Capillary: 113 mg/dL — ABNORMAL HIGH (ref 70–99)
Glucose-Capillary: 117 mg/dL — ABNORMAL HIGH (ref 70–99)
Glucose-Capillary: 132 mg/dL — ABNORMAL HIGH (ref 70–99)

## 2020-01-08 LAB — HEMOGLOBIN AND HEMATOCRIT, BLOOD
HCT: 28.9 % — ABNORMAL LOW (ref 36.0–46.0)
Hemoglobin: 8.7 g/dL — ABNORMAL LOW (ref 12.0–15.0)

## 2020-01-08 MED ORDER — FUROSEMIDE 10 MG/ML IJ SOLN
40.0000 mg | Freq: Two times a day (BID) | INTRAMUSCULAR | Status: AC
  Administered 2020-01-08 (×2): 40 mg via INTRAVENOUS
  Filled 2020-01-08 (×2): qty 4

## 2020-01-08 MED ORDER — VITAMIN D (ERGOCALCIFEROL) 1.25 MG (50000 UNIT) PO CAPS
50000.0000 [IU] | ORAL_CAPSULE | ORAL | Status: DC
  Administered 2020-01-08: 50000 [IU] via ORAL
  Filled 2020-01-08: qty 1

## 2020-01-08 MED ORDER — VITAMIN A 3 MG (10000 UNIT) PO CAPS
60000.0000 [IU] | ORAL_CAPSULE | Freq: Every day | ORAL | Status: AC
  Administered 2020-01-09: 60000 [IU] via ORAL
  Filled 2020-01-08 (×2): qty 6

## 2020-01-08 MED ORDER — VITAMIN A 15 MG/ML IM SOLN: 100000.0000 [IU] | Freq: Every day | INTRAMUSCULAR | Status: DC

## 2020-01-08 MED ORDER — DEXTROSE 5 % IV SOLN
INTRAVENOUS | Status: DC
  Administered 2020-01-09: 1000 mL via INTRAVENOUS

## 2020-01-08 MED ORDER — ZINC SULFATE 220 (50 ZN) MG PO CAPS
220.0000 mg | ORAL_CAPSULE | Freq: Every day | ORAL | Status: DC
  Administered 2020-01-09 – 2020-01-17 (×9): 220 mg via ORAL
  Filled 2020-01-08 (×10): qty 1

## 2020-01-08 MED ORDER — FOLIC ACID 1 MG PO TABS
1.0000 mg | ORAL_TABLET | Freq: Every day | ORAL | Status: DC
  Administered 2020-01-09 – 2020-01-13 (×5): 1 mg via ORAL
  Filled 2020-01-08 (×5): qty 1

## 2020-01-08 MED ORDER — HEPARIN SODIUM (PORCINE) 5000 UNIT/ML IJ SOLN
5000.0000 [IU] | Freq: Three times a day (TID) | INTRAMUSCULAR | Status: DC
  Administered 2020-01-09 – 2020-01-10 (×5): 5000 [IU] via SUBCUTANEOUS
  Filled 2020-01-08 (×4): qty 1

## 2020-01-08 MED ORDER — SODIUM CHLORIDE 0.9% IV SOLUTION: Freq: Once | INTRAVENOUS | Status: AC

## 2020-01-08 MED ORDER — ASCORBIC ACID 500 MG PO TABS
500.0000 mg | ORAL_TABLET | Freq: Two times a day (BID) | ORAL | Status: DC
  Administered 2020-01-08 – 2020-01-12 (×9): 500 mg via ORAL
  Filled 2020-01-08 (×10): qty 1

## 2020-01-08 MED ORDER — PANTOPRAZOLE SODIUM 40 MG PO PACK
40.0000 mg | PACK | Freq: Two times a day (BID) | ORAL | Status: DC
  Administered 2020-01-08 – 2020-01-09 (×3): 40 mg via ORAL
  Filled 2020-01-08 (×4): qty 20

## 2020-01-08 MED ORDER — THIAMINE HCL 100 MG PO TABS
100.0000 mg | ORAL_TABLET | Freq: Every day | ORAL | Status: DC
  Administered 2020-01-09 – 2020-01-13 (×5): 100 mg via ORAL
  Filled 2020-01-08 (×5): qty 1

## 2020-01-08 MED ORDER — ACETAMINOPHEN 10 MG/ML IV SOLN
1000.0000 mg | Freq: Once | INTRAVENOUS | Status: AC
  Administered 2020-01-08: 1000 mg via INTRAVENOUS
  Filled 2020-01-08: qty 100

## 2020-01-08 MED ORDER — VITAMIN A 15 MG/ML IM SOLN: 50000.0000 [IU] | Freq: Every day | INTRAMUSCULAR | Status: DC

## 2020-01-08 MED ORDER — ADULT MULTIVITAMIN W/MINERALS CH
1.0000 | ORAL_TABLET | Freq: Every day | ORAL | Status: DC
  Administered 2020-01-08 – 2020-01-13 (×5): 1 via ORAL
  Filled 2020-01-08 (×6): qty 1

## 2020-01-08 MED ORDER — FERROUS SULFATE 325 (65 FE) MG PO TABS
325.0000 mg | ORAL_TABLET | Freq: Two times a day (BID) | ORAL | Status: DC
  Administered 2020-01-08 – 2020-01-12 (×7): 325 mg via ORAL
  Filled 2020-01-08 (×8): qty 1

## 2020-01-08 MED ORDER — VITAMIN A 3 MG (10000 UNIT) PO CAPS
20000.0000 [IU] | ORAL_CAPSULE | Freq: Every day | ORAL | Status: DC
  Administered 2020-01-10 – 2020-01-13 (×3): 20000 [IU] via ORAL
  Filled 2020-01-08 (×4): qty 2

## 2020-01-08 MED ORDER — POTASSIUM CHLORIDE 20 MEQ/15ML (10%) PO SOLN
40.0000 meq | Freq: Two times a day (BID) | ORAL | Status: AC
  Administered 2020-01-08 (×2): 40 meq via ORAL
  Filled 2020-01-08 (×2): qty 30

## 2020-01-08 NOTE — Final Consult Note (Signed)
Consultant Final Sign-Off Note    Assessment/Final recommendations  Alyssa Carey is a 49 y.o. female followed by me for:  Necrotizing fasciitis of left buttock -S/Pincision and debridement of skin, subcutaneous tissue, fascia and muscle 15x15x6cm, left buttock5/8 Dr. 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 Anabel Halon - Ostomy functioning. WOCN following -Dressing changes TID currently. Continue Hydrotherapy and local wound care.  - Abx completed  - No indication for further surgery at this time. We will arrange follow up and sign off at this time.   Wound care (if applicable): Continue TID dressing changes. Hydrotherapy 3x/weekly while inpatient.   Diet at discharge: per primary team   Activity at discharge: per primary team   Follow-up appointment:  DOW   Pending results:  Unresulted Labs (From admission, onward) Comment          Start     Ordered   01/09/20 0500  CBC  Tomorrow morning,   R       Question:  Specimen collection method  Answer:  Unit=Unit collect   01/08/20 1012   01/09/20 7096  Basic metabolic panel  Tomorrow morning,   STAT       Question:  Specimen collection method  Answer:  Unit=Unit collect   01/08/20 1012           Medication recommendations: No further abx indicated from our standpoint    Other recommendations: Foley per CCM    Thank you for allowing [place] to participate in the care of your patient!  Please consult [place] again if you have further needs for your patient.  Barth Kirks [facility] 01/08/2020 3:11 PM   Subjective   Now extubated. Feeling better. Seen with hydrotherapy.   Objective  Vital signs in last 24 hours: Temp:  [98 F (36.7 C)-99 F (37.2 C)] 98.2 F (36.8 C) (06/24 1200) Pulse Rate:  [104-136] 121 (06/24 1200) Resp:  [15-41] 24 (06/24 1200) BP: (104-158)/(53-97) 158/94 (06/24 1200) SpO2:  [100  %] 100 % (06/24 1200) Weight:  [111.3 kg] 111.3 kg (06/24 0500)  GI: Ostomy functioning with liquid stool in bag  Skin:Wound measures 13cm x 11cm. The deepest portion of the wound measures 9cm. Improved wound edges as noted below. No purulent drainage noted. Healthy granulation tissue at the base. See pictures below     Pertinent labs and Studies: Recent Labs    01/06/20 0437 01/06/20 0437 01/07/20 0412 01/08/20 0500 01/08/20 1323  WBC 10.8*  --  12.1* 12.4*  --   HGB 7.1*   < > 7.0* 6.4* 8.7*  HCT 24.1*   < > 24.1* 22.4* 28.9*   < > = values in this interval not displayed.   BMET Recent Labs    01/07/20 0412 01/08/20 0500  NA 151* 156*  K 3.4* 3.3*  CL 123* 124*  CO2 24 26  GLUCOSE 172* 129*  BUN 36* 27*  CREATININE 0.32* <0.30*  CALCIUM 7.9* 8.2*   No results for input(s): LABURIN in the last 72 hours. Results for orders placed or performed during the hospital encounter of 12/21/19  SARS Coronavirus 2 by RT PCR (hospital order, performed in [facility] hospital lab) Nasopharyngeal Nasopharyngeal Swab     Status: None   Collection Time: 12/21/19  6:26 PM   Specimen: Nasopharyngeal Swab  Result Value Ref Range Status   SARS Coronavirus 2 NEGATIVE NEGATIVE Final    Comment: (NOTE) SARS-CoV-2 target nucleic acids are NOT  DETECTED. The SARS-CoV-2 RNA is generally detectable in upper and lower respiratory specimens during the acute phase of infection. The lowest concentration of SARS-CoV-2 viral copies this assay can detect is 250 copies / mL. A negative result does not preclude SARS-CoV-2 infection and should not be used as the sole basis for treatment or other patient management decisions.  A negative result may occur with improper specimen collection / handling, submission of specimen other than nasopharyngeal swab, presence of viral mutation(s) within the areas targeted by this assay, and inadequate number of viral copies (<250 copies / mL). A negative result  must be combined with clinical observations, patient history, and epidemiological information. Fact Sheet for Patients:   StrictlyIdeas.no Fact Sheet for Healthcare Providers: BankingDealers.co.za This test is not yet approved or cleared  by the [place] FDA and has been authorized for detection and/or diagnosis of SARS-CoV-2 by FDA under an Emergency Use Authorization (EUA).  This EUA will remain in effect (meaning this test can be used) for the duration of the COVID-19 declaration under Section 564(b)(1) of the Act, 21 U.S.C. section 360bbb-3(b)(1), unless the authorization is terminated or revoked sooner. Performed at [facility], [place] [address], [place], [place] [address]   Urine Culture     Status: Abnormal   Collection Time: 12/22/19 11:12 AM   Specimen: Urine, Clean Catch  Result Value Ref Range Status   Specimen Description   Final    URINE, CLEAN CATCH Performed at [facility], [place] [address], [place], [place] [address]    Special Requests   Final    NONE Performed at [facility], [place] [address], [place], [place] [address]    Culture MULTIPLE SPECIES PRESENT, SUGGEST RECOLLECTION (A)  Final   Report Status 12/23/2019 FINAL  Final  Culture, blood (routine x 2)     Status: None   Collection Time: 12/23/19  4:53 AM   Specimen: BLOOD RIGHT HAND  Result Value Ref Range Status   Specimen Description BLOOD RIGHT HAND  Final   Special Requests   Final    BOTTLES DRAWN AEROBIC ONLY Blood Culture adequate volume   Culture   Final    NO GROWTH 5 DAYS Performed at [facility], [place] [address], [place], [place] [address]    Report Status 12/28/2019 FINAL  Final  Culture, blood (routine x 2)     Status: None   Collection Time: 12/23/19  4:53 AM   Specimen: BLOOD LEFT HAND  Result Value Ref Range Status   Specimen Description BLOOD LEFT HAND  Final   Special  Requests   Final    BOTTLES DRAWN AEROBIC ONLY Blood Culture adequate volume   Culture   Final    NO GROWTH 5 DAYS Performed at [facility], [place] [address], [place], [place] [address]    Report Status 12/28/2019 FINAL  Final  MRSA PCR Screening     Status: None   Collection Time: 12/23/19  3:58 PM   Specimen: Nasal Mucosa; Nasopharyngeal  Result Value Ref Range Status   MRSA by PCR NEGATIVE NEGATIVE Final    Comment:        The GeneXpert MRSA Assay (FDA approved for NASAL specimens only), is one component of a comprehensive MRSA colonization surveillance program. It is not intended to diagnose MRSA infection nor to guide or monitor treatment for MRSA infections. Performed at [facility], [place] [address], [place], [place] [address]  Aerobic/Anaerobic Culture (surgical/deep wound)     Status: None   Collection Time: 12/23/19  7:26 PM   Specimen: PATH Other; Tissue  Result Value Ref Range Status   Specimen Description   Final    WOUND LEFT BUTTOCKS Performed at [facility], [place] [address] [place], [place] [address]    Special Requests   Final    NONE Performed at [facility], [place] [address] [place], [place] [address]    Gram Stain   Final    NO WBC SEEN ABUNDANT GRAM POSITIVE COCCI ABUNDANT GRAM NEGATIVE RODS ABUNDANT GRAM POSITIVE RODS    Culture   Final    FEW PROTEUS MIRABILIS FEW ESCHERICHIA COLI FEW VANCOMYCIN RESISTANT ENTEROCOCCUS ISOLATED NO ANAEROBES ISOLATED Performed at [facility], [address] [place], [place] [address]    Report Status 12/28/2019 FINAL  Final   Organism ID, Bacteria PROTEUS MIRABILIS  Final   Organism ID, Bacteria ESCHERICHIA COLI  Final   Organism ID, Bacteria VANCOMYCIN RESISTANT ENTEROCOCCUS ISOLATED  Final      Susceptibility   Escherichia coli - MIC*    AMPICILLIN >=32 RESISTANT Resistant     CEFAZOLIN <=4 SENSITIVE Sensitive     CEFEPIME <=1 SENSITIVE Sensitive      CEFTAZIDIME <=1 SENSITIVE Sensitive     CEFTRIAXONE <=1 SENSITIVE Sensitive     CIPROFLOXACIN <=0.25 SENSITIVE Sensitive     GENTAMICIN <=1 SENSITIVE Sensitive     IMIPENEM <=0.25 SENSITIVE Sensitive     TRIMETH/SULFA <=20 SENSITIVE Sensitive     AMPICILLIN/SULBACTAM 16 INTERMEDIATE Intermediate     PIP/TAZO <=4 SENSITIVE Sensitive     * FEW ESCHERICHIA COLI   Proteus mirabilis - MIC*    AMPICILLIN <=2 SENSITIVE Sensitive     CEFAZOLIN <=4 SENSITIVE Sensitive     CEFEPIME <=1 SENSITIVE Sensitive     CEFTAZIDIME <=1 SENSITIVE Sensitive     CEFTRIAXONE <=1 SENSITIVE Sensitive     CIPROFLOXACIN <=0.25 SENSITIVE Sensitive     GENTAMICIN <=1 SENSITIVE Sensitive     IMIPENEM 1 SENSITIVE Sensitive     TRIMETH/SULFA <=20 SENSITIVE Sensitive     AMPICILLIN/SULBACTAM <=2 SENSITIVE Sensitive     PIP/TAZO <=4 SENSITIVE Sensitive     * FEW PROTEUS MIRABILIS   Vancomycin resistant enterococcus isolated - MIC*    AMPICILLIN <=2 SENSITIVE Sensitive     VANCOMYCIN RESISTANT Resistant     GENTAMICIN SYNERGY SENSITIVE Sensitive     LINEZOLID 2 SENSITIVE Sensitive     * FEW VANCOMYCIN RESISTANT ENTEROCOCCUS ISOLATED  Culture, blood (Routine X 2) w Reflex to ID Panel     Status: None   Collection Time: 12/28/19  7:54 AM   Specimen: BLOOD  Result Value Ref Range Status   Specimen Description   Final    BLOOD PORTA CATH Performed at [facility], [place] [address] [place], [place] [address]    Special Requests   Final    BOTTLES DRAWN AEROBIC ONLY Blood Culture adequate volume Performed at [place] [address], [place], [place] [address]    Culture   Final    NO GROWTH 5 DAYS Performed at [facility], [place] [address], [place], [place] [address]    Report Status 01/02/2020 FINAL  Final    Imaging: No results found.

## 2020-01-08 NOTE — Progress Notes (Signed)
   01/08/20 1400 Hydrotherapy note  Subjective Assessment  Subjective patient extubated, mouthing word, nods  yes /no when asked about pain  Patient and Family Stated Goals agreed to wound care today, nods yes   Date of Onset  (abscess present on admission)  Prior Treatments s/p surgical I & D x2, diverting colostomy  Evaluation and Treatment  Evaluation and Treatment Procedures Explained to Patient/Family Yes (eyes open, does not follow)  Evaluation and Treatment Procedures Patient unable to consent due to mental status  Wound / Incision (Open or Dehisced) 12/27/19 Non-pressure wound Buttocks Left *PT ONLY* open wound L gluteal area  Date First Assessed/Time First Assessed: 12/27/19 1410   Wound Type: Non-pressure wound  Location: Buttocks  Location Orientation: Left  Wound Description (Comments): *PT ONLY* open wound L gluteal area  Present on Admission: (c)   Dressing Type ABD;Gauze (Comment);Moist to dry  Dressing Changed New  Dressing Status Clean  Dressing Change Frequency PRN  % Wound base Red or Granulating 50%  % Wound base Yellow/Fibrinous Exudate 25%  % Wound base Other/Granulation Tissue (Comment) 25% (eschar/fatty ts  at caufdle end)  Peri-wound Assessment Erythema (blanchable)  Wound Length (cm) 17 cm  Wound Width (cm) 13 cm  Wound Depth (cm) 8 cm  Wound Volume (cm^3) 1768 cm^3  Wound Surface Area (cm^2) 221 cm^2  Tunneling (cm) 6 ( at 5:00)  Undermining (cm) 4  Margins Unattached edges (unapproximated)  Drainage Amount Moderate  Drainage Description Serosanguineous (bloddy)  Non-staged Wound Description Full thickness  Treatment Hydrotherapy (Pulse lavage);Debridement (Selective);Packing (Saline gauze) (debride by PA)  Hydrotherapy  Pulsed lavage therapy - wound location L gluteal area   Pulsed Lavage with Suction (psi) 12 psi  Pulsed Lavage with Suction - Normal Saline Used 1000 mL  Pulsed Lavage Tip Tip with splash shield  Wound Therapy -  Assess/Plan/Recommendations  Wound Therapy - Clinical Statement patient  is awake. follows some commands. Surgery PA in to debride and assess. He will consult and recommend furhter hydro and add santyl. Wound is red, hypergranulated with fatty  tissue and yellow connective ts in base. Collection bag at lap site moved and large amount of fluid drained from bag when rolling to right side. RN changed bag.  Wound Therapy - Functional Problem List immobility, sedated on vent   Factors Delaying/Impairing Wound Healing Substance abuse;Immobility;Multiple medical problems  Hydrotherapy Plan Debridement;Dressing change;Patient/family education;Pulsatile lavage with suction  Wound Therapy - Frequency 6X / week  Wound Therapy - Current Recommendations PT (when less sedated)  Wound Therapy - Follow Up Recommendations Skilled nursing facility (LT[facility])  Wound Plan Pt will benefit from hydrotherapy as part a multi-modal/multi-disciplinary wound care to approach to facilitate healing and decr bioburden. Add santyl  Wound Therapy Goals - Improve the function of patient's integumentary system by progressing the wound(s) through the phases of wound healing by:  Decrease Necrotic Tissue to 75  Decrease Necrotic Tissue - Progress Progressing toward goal  Increase Granulation Tissue to 25  Increase Granulation Tissue - Progress Progressing toward goal  Improve Drainage Characteristics Mod  Improve Drainage Characteristics - Progress Progressing toward goal  Goals/treatment plan/discharge plan were made with and agreed upon by patient/family No, Patient unable to participate in goals/treatment/discharge Father ans son present and agree.  Time For Goal Achievement Other (comment) (3 wks)  Wound Therapy - Potential for Goals [place] Pager [phone] Office [phone]

## 2020-01-08 NOTE — Progress Notes (Signed)
OT Cancellation Note  Patient Details Name: Alyssa Carey MRN: [id number] DOB: 04-Apr-1971   Cancelled Treatment:    Reason Eval/Treat Not Completed: Medical issues which prohibited therapy. Upon chart review patient's hemoglobin 6.4. Will check back as schedule permits.  Delbert Phenix OT Pager: [place] 01/08/2020, 6:53 AM

## 2020-01-08 NOTE — Progress Notes (Signed)
[place] Progress Note Patient Name: Alyssa Carey DOB: 1970-09-21 MRN: [id number]   Date of Service  01/08/2020  HPI/Events of Note  Hemoglobin 6.4 gm %, no evidence of actively bleeding focus.  eICU Interventions  Transfuse 1 unit PRBC.        Kerry Kass Avin Upperman 01/08/2020, 5:39 AM

## 2020-01-08 NOTE — Progress Notes (Signed)
Nutrition Follow-up  DOCUMENTATION CODES:   Obesity unspecified  INTERVENTION:  - diet advancement as medically feasible following MBS. - if unable to advance diet, recommend small bore NGT placement and re-start TF.  NUTRITION DIAGNOSIS:   Increased nutrient needs related to chronic illness, wound healing (alcohol abuse; necrotizing fasciitis) as evidenced by estimated needs. -ongoing  GOAL:   Patient will meet greater than or equal to 90% of their needs -unmet/unable to meet  MONITOR:   Diet advancement, Labs, Weight trends, Skin, I & O's  ASSESSMENT:   Pt with PMH of heavy alcohol history who for one week with decreased intake admitted 6/6 with alcoholic hepatitis with hepatic encephalopathy and AKI.  Significant Events: 6/7-admission; initial RD assessment 6/8-Rapid Response; CT pelvis to r/o necrotizing fascitis--found to be positive for necrotizing fascitis and went to OR for I&D 6/9-remained intubated with OGT in place; started TF; added ascorbic acid BID and zinc once/day.  6/10-return to OR for I&D; lap loop colostomy 6/16-vomiting overnight; TF held and OGT to LIS 6/17-vomiting overnight; OGT removed d/t aspiration event and being partly pulled; small bore NGT placed 6/18- restarted TF at trickle rate 6/21- small bore NGT removed; OGT placed   Patient was extubated yesterday and OGT removed at that time. Patient discussed in rounds. RN reports that patient had pulled small bore NGT and that OGT was subsequently placed and then removed at the time of extubation.   SLP performed bedside swallow eval earlier today and plan is for [facility] 6/25 d/t concern for aspiration. Patient is able to have ice chips and applesauce for PO meds.   Re-estimated nutrition needs. Weight has been stable since 6/18, but is significantly above admission weight. Flow sheet documentation indicates moderate pitting edema to BLE and generalized moderate pitting edema.   Able to talk with  Pharmacist extensively on 6/21 about plan and recommended order for vitamin A and MD made aware of recommendation. Vitamin A accidentally not ordered so will reach out again today concerning this.    Labs reviewed; CBGs: 117 and 113 mg/dl, Na: 156 mmol/l, K: 3.3 mmol/l, Cl: 124 mmol/l, BUN: 27 mg/dl, creatinine: <0.3 mg/dl, Ca: 8.2 mg/dl. Medications reviewed; 500 mg ascorbic acid BID, 325 mg ferrous ulsfate BID, 1 mg folvite/day, 40 mg IV lasix BID, sliding scale novolog, 1 tablet multivitamin with minerals/day, 40 mg oral protonix BID, 40 mEq KCl x2 doses 6/24, 100 mg oral thiamine/day, 50000 units drisdol every 7 days, 220 mg zinc sulfate/day.    Diet Order:   Diet Order            Diet NPO time specified Except for: Ice Chips, Other (See Comments)  Diet effective now                 EDUCATION NEEDS:   Not appropriate for education at this time  Skin:  Skin Assessment:  (necrotizing fasciitis to L buttock) Skin Integrity Issues:: DTI DTI: R ear Stage II: NA Unstageable: NA Incisions: L buttocks (6/8 + 6/10); abdomen (6/10)  Last BM:  6/24 (500 ml; colostomy)  Height:   Ht Readings from Last 1 Encounters:  01/05/20 5' 4"  (1.626 m)    Weight:   Wt Readings from Last 1 Encounters:  01/08/20 111.3 kg    Estimated Nutritional Needs:  Kcal:  2500-2700 kcal Protein:  160-175 grams Fluid:  >/= 2.3 L/day     Jarome Matin, MS, RD, LDN, CNSC Inpatient Clinical Dietitian RD pager # available in [facility]  After hours/weekend  pager # available in [facility]

## 2020-01-08 NOTE — Progress Notes (Signed)
[place] Progress Note Patient Name: Alyssa Carey DOB: 26-Nov-1970 MRN: [id number]   Date of Service  01/08/2020  HPI/Events of Note  Patient is in pain following  A bth by bedside RN's.  eICU Interventions  Iv Tylenol 1000 mg x 1.        Kerry Kass Lacey Dotson 01/08/2020, 4:58 AM

## 2020-01-08 NOTE — Evaluation (Signed)
Clinical/Bedside Swallow Evaluation Patient Details  Name: Alyssa Carey MRN: [id number] Date of Birth: 1971/06/03  Today's Date: 01/08/2020 Time: SLP Start Time (ACUTE ONLY): 1000 SLP Stop Time (ACUTE ONLY): 1025 SLP Time Calculation (min) (ACUTE ONLY): 25 min  Past Medical History: History reviewed. No pertinent past medical history. Past Surgical History:  Past Surgical History:  Procedure Laterality Date  . INCISION AND DRAINAGE PERIRECTAL ABSCESS N/A 12/25/2019   Procedure: IRRIGATION AND DEBRIDEMENT BUTTOCKS, LAP LOOP COLOSTOMY;  Surgeon: Greer Pickerel, MD;  Location: [facility];  Service: General;  Laterality: N/A;  . IRRIGATION AND DEBRIDEMENT ABSCESS N/A 12/23/2019   Procedure: EXCISION AND DEBRIDEMENT LEFT BUTTOCK AND PERINEUM;  Surgeon: Clovis Riley, MD;  Location: [facility];  Service: General;  Laterality: N/A;  . LAPAROSCOPIC LOOP COLOSTOMY N/A 12/25/2019   Procedure: LAPAROSCOPIC LOOP COLOSTOMY;  Surgeon: Greer Pickerel, MD;  Location: [facility];  Service: General;  Laterality: N/A;   HPI:  49 year old with alcohol abuse, alcohol withdrawal, necrotizing fasciitis of left buttock status post debridement. Acute respiratory failure secondary to septic shock. Intubated from 6/8 to 6/23.    Assessment / Plan / Recommendation Clinical Impression  Pt demonstrates signs of aspiration and dysphagia following 16 day intubation. Pt is dysphonic and cough is weak and also dysphonic. With ice chip sand puree pt has adequate oral intake and subjective swallow reponse. Pt tolerated 4 oz of puree without cough or difficulty. However, when given sips of liquids, of all textures, this was followed by immediate cough response. Pt may take oral meds in puree and enjoy puree and ice chips today. Will plan for MBS tomorrow if medically stable for instrumental assessment prior to the weekend.  SLP Visit Diagnosis: Dysphagia, oropharyngeal phase (R13.12)    Aspiration Risk  Severe aspiration risk    Diet  Recommendation NPO except meds;Ice chips PRN after oral care;Other (Comment) (bite of puree)   Supervision: Full supervision/cueing for compensatory strategies Compensations: Slow rate;Small sips/bites Postural Changes: Seated upright at 90 degrees    Other  Recommendations Oral Care Recommendations: Oral care QID   Follow up Recommendations Skilled Nursing facility      Frequency and Duration min 2x/week  2 weeks       Prognosis Prognosis for Safe Diet Advancement: Fair      Swallow Study   General HPI: 49 year old with alcohol abuse, alcohol withdrawal, necrotizing fasciitis of left buttock status post debridement. Acute respiratory failure secondary to septic shock. Intubated from 6/8 to 6/23.  Type of Study: Bedside Swallow Evaluation Diet Prior to this Study: NPO Temperature Spikes Noted: No Respiratory Status: Nasal cannula History of Recent Intubation: Yes Length of Intubations (days): 16 days Date extubated: 01/07/20 Behavior/Cognition: Alert;Cooperative Oral Cavity Assessment: Within Functional Limits Oral Care Completed by SLP: No Oral Cavity - Dentition: Adequate natural dentition Vision: Functional for self-feeding Self-Feeding Abilities: Needs assist Patient Positioning: Upright in bed Baseline Vocal Quality: Hoarse Volitional Cough: Weak;Congested    Oral/Motor/Sensory Function Overall Oral Motor/Sensory Function: Within functional limits   Ice Chips Ice chips: Within functional limits   Thin Liquid Thin Liquid: Impaired Presentation: Cup;Straw;Self Fed Pharyngeal  Phase Impairments: Cough - Immediate    Nectar Thick Nectar Thick Liquid: Impaired Presentation: Cup Pharyngeal Phase Impairments: Cough - Immediate   Honey Thick Honey Thick Liquid: Impaired Pharyngeal Phase Impairments: Cough - Immediate   Puree Puree: Within functional limits Presentation: Spoon;Self Fed   Solid     Solid: Not tested     Herbie Baltimore, MA CCC-SLP  Acute  Rehabilitation Services Pager [phone] Office [phone]  Lynann Beaver 01/08/2020,10:32 AM

## 2020-01-08 NOTE — Progress Notes (Addendum)
NAME:  Alyssa Carey, MRN:  [id number], DOB:  March 18, 1971, LOS: 65 ADMISSION DATE:  12/21/2019, CONSULTATION DATE:  6/8 REFERRING MD:  Kshitiz, CHIEF COMPLAINT:  Confusion   Brief History   49 y/o female with a heavy alcohol history admitted in the setting of confusion and presumed alcohol withdrawal who later developed sepsis from a gas forming wound.  Required prolonged ICU stay on vent, debridement and hydrotherapy.   Past Medical History  Alcohol abuse  Significant Hospital Events   6/06 admission 6/08 ICU admission to OR ofr I&D, back intubated and on pressors, 6/09 started tube feeds. Still pressor dependent, kept on vent w/ anticipated return to OR planned  6/10 Back to the OR for significant debridement and also placement of diverting colostomy 6/11 Sedated, low-dose phenylephrine. CCS felt wound looking good.  Sedation changed to precedex / PRN fent  6/12 more awake, on 15/5 pressure support 6/13 family discussion >> DNR, continue medical care 6//15 transfuse 1 unit PRBC; vomiting, tube feeds held 6/16 Vomiting overnight 6/17 Trial PSV 15/5 with variable Vt, remains on vasopressors.  Vomiting overnight  6/18 transfuse 1 unit PRBC 6/20 Off pressors,  On precedex and weaning on PSV  6/21 Weaning on PSV, planned hydrotherapy   Consults:  General surgery  Procedures:  ETT 6/8 >> Right IJ CVL 6/8 >>6/18  PIC  LUE  6/18   Significant Diagnostic Tests:   6/6 CT head > NAICP  6/7 U/S abdomen > hepatic steatosis  6/8 CT Pelvis > gas throughout medial subcutaneous soft tissues of left buttock into the left perineum  Micro Data:  6/06 SARS COV 2 > negative 6/08 blood > negative 6/08 Lt buttocks wound > Proteus, E coli (resistant to ampicillin, intermediate to unasyn), VRE 6/13 blood > negative  Antimicrobials:  Rocephin 6/07 >> Clindamycin 6/08 >> 6/16 Vancomycin 6/08 >> 6/11 Ceftriaxone 6/8 >> 6/22 Flagyl 6/08 >> 6/22 Linezolid 6/13 >> 6/23  Interim  history/subjective:  Tachypnea, tachycardia overnight > treated with precedex  Anemia with Hgb down to 6.4 Pt reports feeling ok, mild pain at wound site  On 2L [place] Glucose range 113-130 Afebrile   Objective   Blood pressure 134/86, pulse (!) 105, temperature 98.1 F (36.7 C), temperature source Oral, resp. rate (!) 29, height 5' 4"  (1.626 m), weight 111.3 kg, last menstrual period 03/23/2019, SpO2 100 %.        Intake/Output Summary (Last 24 hours) at 01/08/2020 1001 Last data filed at 01/08/2020 0757 Gross per 24 hour  Intake 347.65 ml  Output 3950 ml  Net -3602.35 ml   Filed Weights   01/02/20 0459 01/03/20 0500 01/08/20 0500  Weight: 111.6 kg 111.3 kg 111.3 kg    Examination: General: adult female lying in bed in NAD  HEENT: MM pink/moist, [place] O2, anicteric Neuro: AAOx4, soft raspy voice, speech clear, MAE / generalized weakness  CV: s1s2 RRR, ST on monitor, no m/r/g PULM:  Non-labored, lungs bilaterally clear  GI: soft, bsx4 active  Extremities: warm/dry, 1-2+ generalized edema  Skin: no rashes or lesions  Resolved Hospital Problem list   Non gap metabolic acidosis, thrombocytopenia in 2nd to sepsis and alcohol abuse, hepatic encephalopathy, Elevated LFTs from acute alcoholic hepatitis with fatty liver, Vomiting (6/16, 6/17)  Assessment & Plan:   Septic shock from necrotizing fasciitis of Lt buttock s/p debridement. Proteus, E coli, VRE in wound culture.  Off pressors since 6/19. Completed abx 6/23 -monitor off abx  -wound care per CCS  Acute hypoxic  respiratory failure in setting of septic shock. -pulmonary hygiene - IS, mobilize  -wean O2 for sats >90% -repeat lasix 6/24 with KCL -suspect tachypnea, tachycardia due to anemia overnight   At Risk Malnutrition  -SLP evaluation for diet    Acute metabolic encephalopathy 2nd to sepsis. ETOH Abuse Hx  -minimize sedating medications   Hypokalemia  Hypernatremia  -KCL as above -monitor, replace as indicated    Hyperglycemia. No hx DM  -SSI, very sensitive scale  Anemia of critical illness and chronic disease. PRBC 6/18, 6/24 -trend CBC, transfuse for Hgb <7%   Best practice:  Nutrition: SLP evaluation for diet Sedation: n/a SUP: protonix DVT prophylaxis: SQ Heparin  Code status: DNR Disposition: SDU status, to TRH as of 6/25 am  Labs:   CMP Latest Ref Rng & Units 01/08/2020 01/07/2020 01/06/2020  Glucose 70 - 99 mg/dL 129(H) 172(H) 161(H)  BUN 6 - 20 mg/dL 27(H) 36(H) 44(H)  Creatinine 0.44 - 1.00 mg/dL <0.30(L) 0.32(L) 0.34(L)  Sodium 135 - 145 mmol/L 156(H) 151(H) 152(H)  Potassium 3.5 - 5.1 mmol/L 3.3(L) 3.4(L) 3.2(L)  Chloride 98 - 111 mmol/L 124(H) 123(H) 123(H)  CO2 22 - 32 mmol/L 26 24 24   Calcium 8.9 - 10.3 mg/dL 8.2(L) 7.9(L) 8.3(L)  Total Protein 6.5 - 8.1 g/dL - - -  Total Bilirubin 0.3 - 1.2 mg/dL - - -  Alkaline Phos 38 - 126 U/L - - -  AST 15 - 41 U/L - - -  ALT 0 - 44 U/L - - -    CBC Latest Ref Rng & Units 01/08/2020 01/07/2020 01/06/2020  WBC 4.0 - 10.5 K/uL 12.4(H) 12.1(H) 10.8(H)  Hemoglobin 12.0 - 15.0 g/dL 6.4(LL) 7.0(L) 7.1(L)  Hematocrit 36 - 46 % 22.4(L) 24.1(L) 24.1(L)  Platelets 150 - 400 K/uL 387 369 364    ABG    Component Value Date/Time   PHART 7.394 12/27/2019 0355   PCO2ART 28.4 (L) 12/27/2019 0355   PO2ART 128 (H) 12/27/2019 0355   HCO3 16.6 (L) 12/27/2019 0355   TCO2 17 (L) 12/25/2019 1726   ACIDBASEDEF 6.6 (H) 12/27/2019 0355   O2SAT 98.6 12/27/2019 0355    CBG (last 3)  Recent Labs    01/07/20 2330 01/08/20 0306 01/08/20 0753  GLUCAP 121* 117* 113*      CC Time: n/a   Noe Gens, MSN, NP-C [facility] 01/08/2020, 10:01 AM   Please see [email or web address] for pager details.

## 2020-01-09 ENCOUNTER — Inpatient Hospital Stay (HOSPITAL_COMMUNITY): Payer: Medicaid Other

## 2020-01-09 LAB — GLUCOSE, CAPILLARY
Glucose-Capillary: 108 mg/dL — ABNORMAL HIGH (ref 70–99)
Glucose-Capillary: 114 mg/dL — ABNORMAL HIGH (ref 70–99)
Glucose-Capillary: 116 mg/dL — ABNORMAL HIGH (ref 70–99)
Glucose-Capillary: 117 mg/dL — ABNORMAL HIGH (ref 70–99)
Glucose-Capillary: 118 mg/dL — ABNORMAL HIGH (ref 70–99)
Glucose-Capillary: 127 mg/dL — ABNORMAL HIGH (ref 70–99)

## 2020-01-09 LAB — CBC
HCT: 27.9 % — ABNORMAL LOW (ref 36.0–46.0)
HCT: 28.7 % — ABNORMAL LOW (ref 36.0–46.0)
Hemoglobin: 8.4 g/dL — ABNORMAL LOW (ref 12.0–15.0)
Hemoglobin: 8.7 g/dL — ABNORMAL LOW (ref 12.0–15.0)
MCH: 33.3 pg (ref 26.0–34.0)
MCH: 33.2 pg (ref 26.0–34.0)
MCHC: 30.1 g/dL (ref 30.0–36.0)
MCHC: 30.3 g/dL (ref 30.0–36.0)
MCV: 110.7 fL — ABNORMAL HIGH (ref 80.0–100.0)
MCV: 109.5 fL — ABNORMAL HIGH (ref 80.0–100.0)
Platelets: 460 10*3/uL — ABNORMAL HIGH (ref 150–400)
Platelets: 517 10*3/uL — ABNORMAL HIGH (ref 150–400)
RBC: 2.52 MIL/uL — ABNORMAL LOW (ref 3.87–5.11)
RBC: 2.62 MIL/uL — ABNORMAL LOW (ref 3.87–5.11)
RDW: 24 % — ABNORMAL HIGH (ref 11.5–15.5)
RDW: 24 % — ABNORMAL HIGH (ref 11.5–15.5)
WBC: 13.4 10*3/uL — ABNORMAL HIGH (ref 4.0–10.5)
WBC: 13.7 10*3/uL — ABNORMAL HIGH (ref 4.0–10.5)
nRBC: 0.2 % (ref 0.0–0.2)
nRBC: 0.5 % — ABNORMAL HIGH (ref 0.0–0.2)

## 2020-01-09 LAB — BASIC METABOLIC PANEL
Anion gap: 6 (ref 5–15)
Anion gap: 10 (ref 5–15)
Anion gap: 10 (ref 5–15)
BUN: 16 mg/dL (ref 6–20)
BUN: 15 mg/dL (ref 6–20)
BUN: 15 mg/dL (ref 6–20)
CO2: 28 mmol/L (ref 22–32)
CO2: 26 mmol/L (ref 22–32)
CO2: 26 mmol/L (ref 22–32)
Calcium: 8.3 mg/dL — ABNORMAL LOW (ref 8.9–10.3)
Calcium: 8.4 mg/dL — ABNORMAL LOW (ref 8.9–10.3)
Calcium: 8.3 mg/dL — ABNORMAL LOW (ref 8.9–10.3)
Chloride: 119 mmol/L — ABNORMAL HIGH (ref 98–111)
Chloride: 119 mmol/L — ABNORMAL HIGH (ref 98–111)
Chloride: 117 mmol/L — ABNORMAL HIGH (ref 98–111)
Creatinine, Ser: <0.3 mg/dL — ABNORMAL LOW (ref 0.44–1.00)
Creatinine, Ser: 0.31 mg/dL — ABNORMAL LOW (ref 0.44–1.00)
Creatinine, Ser: <0.3 mg/dL — ABNORMAL LOW (ref 0.44–1.00)
GFR calc Af Amer: >60 mL/min (ref >60)
GFR calc non Af Amer: >60 mL/min (ref >60)
Glucose, Bld: 132 mg/dL — ABNORMAL HIGH (ref 70–99)
Glucose, Bld: 135 mg/dL — ABNORMAL HIGH (ref 70–99)
Glucose, Bld: 137 mg/dL — ABNORMAL HIGH (ref 70–99)
Potassium: 3.2 mmol/L — ABNORMAL LOW (ref 3.5–5.1)
Potassium: 2.9 mmol/L — ABNORMAL LOW (ref 3.5–5.1)
Potassium: 3 mmol/L — ABNORMAL LOW (ref 3.5–5.1)
Sodium: 153 mmol/L — ABNORMAL HIGH (ref 135–145)
Sodium: 155 mmol/L — ABNORMAL HIGH (ref 135–145)
Sodium: 153 mmol/L — ABNORMAL HIGH (ref 135–145)

## 2020-01-09 LAB — OSMOLALITY, URINE: Osmolality, Ur: 706 mOsm/kg (ref 300–900)

## 2020-01-09 LAB — SODIUM, URINE, RANDOM: Sodium, Ur: 11 mmol/L

## 2020-01-09 MED ORDER — POTASSIUM CHLORIDE CRYS ER 20 MEQ PO TBCR
40.0000 meq | EXTENDED_RELEASE_TABLET | ORAL | Status: DC
  Administered 2020-01-09: 40 meq via ORAL
  Filled 2020-01-09 (×2): qty 2

## 2020-01-09 MED ORDER — POTASSIUM CHLORIDE 20 MEQ PO PACK
40.0000 meq | PACK | Freq: Once | ORAL | Status: AC
  Administered 2020-01-09: 40 meq via ORAL
  Filled 2020-01-09: qty 2

## 2020-01-09 MED ORDER — RESOURCE THICKENUP CLEAR PO POWD
ORAL | Status: DC | PRN
  Filled 2020-01-09 (×2): qty 125

## 2020-01-09 NOTE — Progress Notes (Signed)
PT HYDR therapy  Cancellation Note  Patient Details Name: Alyssa Carey MRN: [id number] DOB: [birth year]   Cancelled Treatment:    Reason Eval/Treat Not Completed: Medical issues which prohibited therapy, RN reports liquid stool from rectum contaminating wound, which she irrigated and changed. Hydrotherapy has been decreased to 3 x week, will continue 6/28 .    Claretha Cooper 01/09/2020, 12:48 PM  [place] Pager [phone] Office [phone]

## 2020-01-09 NOTE — Progress Notes (Signed)
Pt transferred from low air loss bed to [facility] Tilt bed without complication

## 2020-01-09 NOTE — Progress Notes (Signed)
PROGRESS NOTE    [name]  Carey:[id number] DOB: Sep 07, 1970 DOA: 12/21/2019 PCP: Patient, No Pcp Per   Brief Narrative: Alyssa Carey is a 49 y.o. female that presented secondary to concern for alcohol withdrawal and quickly developed evidence of necrotizing fasciitis.  This was emergently managed in the OR by general surgery via I&D. Patient required intubation, vasopressor support and ICU admission for septic shock. She underwent repeat I&D and diverting colostomy. She has now been extubate and is off vasopressors. Hydrotherapy started.    ICU significant events: 6/06 admission 6/08 ICU admission to OR ofr I&D, back intubated and on pressors, 6/09 started tube feeds. Still pressor dependent, kept on vent w/ anticipated return to OR planned  6/10 Back to the OR for significant debridement and also placement of diverting colostomy 6/11 Sedated, low-dose phenylephrine. CCS felt wound looking good.  Sedation changed to precedex / PRN fent  6/12 more awake, on 15/5 pressure support 6/13 family discussion >> DNR, continue medical care 6//15 transfuse 1 unit PRBC; vomiting, tube feeds held 6/16 Vomiting overnight 6/17 Trial PSV 15/5 with variable Vt, remains on vasopressors.  Vomiting overnight  6/18 transfuse 1 unit PRBC 6/20 Off pressors,  On precedex and weaning on PSV  6/21 Weaning on PSV, planned hydrotherapy  6/24 Transferred off of ICU service   Assessment & Plan:   Principal Problem:   Necrotizing fasciitis of pelvic region and thigh [facility]) Active Problems:   Fall at home, initial encounter   Weakness   Alcoholism ([place])   Thrombocytopenia ([place])   ARF (acute renal failure) ([place])   Hepatic encephalopathy ([place])   Alcoholic liver disease ([place])   Pressure injury of skin   Elevated LFTs   Acute respiratory failure with hypoxemia ([place])   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  General surgery was consulted and patient was emergently sent to the OR for incision and debridement.  Antibiotics as mentioned below.  Patient underwent repeat incision and drainage in addition to diverting loop colostomy to aid in healing of the wound.  Patient has now started hydrotherapy.  Septic shock Secondary to necrotizing fasciitis.  Patient required ICU admission, intubation, vasopressor support.  Patient was initially managed on vancomycin, clindamycin, ceftriaxone.  Vancomycin was eventually transitioned to linezolid and clindamycin transition to Flagyl.  Patient was treated with antibiotics from 6/7 until 6/22. Culture data from wound significant for multiple organisms: E. coli, vancomycin resistant Enterococcus 820  Acute respiratory failure with hypoxia Patient required mechanical ventilation from 6/8 until 6/23.  Patient now requiring 2 L via nasal cannula. -Continue to wean to room air as able  Hypernatremia Secondary to poor free water intake.  Urine sodium osmolality obtain which was significant for an appropriately high urine osmolality of 706. -Continue D5 IV fluids -Serial BMP  Malnutrition Not present on admission. Secondary to critical illness.  Patient was initially on tube feeds while intubated.  OG tube was removed.  Speech therapy has evaluated at bedside in addition to modified barium swallow and recommends a dysphagia 1 diet. Albumin of 1.4 -Dysphagia 1 diet  Anemia of critical illness Baseline hemoglobin of 12-13.  Patient developed anemia in setting of severe infection/wound in addition to multiple surgical interventions.  Patient has received 3 units of PRBC to date.  Hemoglobin currently stable.  No evidence of bleeding.  Thrombocytopenia Present early in admission.  In setting of critical illness in addition to alcohol use and liver disease.  Patient received 2 units of  platelets.  Currently resolved.  Hypokalemia -Replete as needed  Hyperglycemia Hemoglobin A1c of 5.7.  Patient meets criteria for prediabetes.  Recommend diet modification for treatment.  Could consider metformin as well as an outpatient.  With patient's current weight, high likelihood weight loss could prevent the need for any medication management.  Alcoholic hepatitis Hepatic encephalopathy Present on admission.  Now resolved.  Was previously on lactulose which has been held in light of current buttock wound.  AKI Patient with unknown baseline.  Patient with a creatinine of 1.74 on admission.  AKI currently resolved.  Morbid obesity Body mass index is 42.12 kg/m.  Pressure injury Deep tissue injury of right ear, not present on admission   DVT prophylaxis: Heparin subcutaneous Code Status:   Code Status: DNR Family Communication: None at bedside.  Patient declined for me to call family Disposition Plan: Anticipate patient will likely discharge to LTAC.  We will start process.  Patient will be receiving continued hydrotherapy per PT.  Patient's hyponatremia also needs to be corrected prior to discharge.   Consultants:   PCCM  General surgery  Procedures:   ENDOTRACHEAL INTUBATION (6/8 >> 01/07/2020)  inCISION AND DEBRIDEMENT OF LEFT BUTTOCK AND PERINEUM (12/23/2019)  IRRIGATION AND DEBRIDEMENT OF BUTTOCKS; LAPAROSCOPIC LOOP COLOSTOMY (12/25/2019)    Antimicrobials:  Ceftriaxone  Vancomycin  Clindamycin  Linezolid  Flagyl   Subjective: Patient reports no issues overnight.  No current issues as well.  Difficult to communicate secondary to recent extubation.  Objective: Vitals:   01/09/20 0600 01/09/20 0630 01/09/20 0700 01/09/20 0800  BP: 138/80  (!) 146/96 138/90  Pulse: (!) 118 (!) 116 (!) 117 (!) 115  Resp: (!) 27 (!) 29 (!) 28 (!) 22  Temp:  98.1 F (36.7 C)  98.2 F (36.8 C)  TempSrc:  Oral  Oral  SpO2: 100% 100% 100% 100%  Weight:      Height:         Intake/Output Summary (Last 24 hours) at 01/09/2020 0837 Last data filed at 01/09/2020 0800 Gross per 24 hour  Intake 1553.74 ml  Output 3100 ml  Net -1546.26 ml   Filed Weights   01/02/20 0459 01/03/20 0500 01/08/20 0500  Weight: 111.6 kg 111.3 kg 111.3 kg    Examination:  General exam: Appears calm and comfortable Respiratory system: Coarse breath sounds transmitted from upper airway. Diminished. Mild tachypnea. Cardiovascular system: S1 & S2 heard, RRR. No murmurs, rubs, gallops or clicks. Gastrointestinal system: Abdomen is nondistended, soft and nontender. Normal bowel sounds heard. Central nervous system: Alert and appears to be oriented. Unable to speak clearly secondary to prolonged intubation.. Musculoskeletal: No calf tenderness Skin: No cyanosis. Wound not examined today. Psychiatry: Judgement and insight appear normal. Mood & affect appropriate.     Data Reviewed: I have personally reviewed following labs and imaging studies  CBC Lab Results  Component Value Date   WBC 13.4 (H) 01/09/2020   RBC 2.52 (L) 01/09/2020   HGB 8.4 (L) 01/09/2020   HCT 27.9 (L) 01/09/2020   MCV 110.7 (H) 01/09/2020   MCH 33.3 01/09/2020   PLT 460 (H) 01/09/2020   MCHC 30.1 01/09/2020   RDW 24.0 (H) 01/09/2020   LYMPHSABS 3.4 12/28/2019   MONOABS 0.8 12/28/2019   EOSABS 0.2 12/28/2019   BASOSABS 0.1 40/98/1191     Last metabolic panel Lab Results  Component Value Date  NA 153 (H) 01/09/2020   K 3.2 (L) 01/09/2020   CL 119 (H) 01/09/2020   CO2 28 01/09/2020   BUN 16 01/09/2020   CREATININE <0.30 (L) 01/09/2020   GLUCOSE 132 (H) 01/09/2020   GFRNONAA NOT CALCULATED 01/09/2020   GFRAA NOT CALCULATED 01/09/2020   CALCIUM 8.3 (L) 01/09/2020   PHOS 3.3 12/30/2019   PROT 5.9 (L) 01/02/2020   ALBUMIN 1.4 (L) 01/02/2020   BILITOT 1.0 01/02/2020   ALKPHOS 117 01/02/2020   AST 39 01/02/2020   ALT 21 01/02/2020   ANIONGAP 6 01/09/2020    CBG (last 3)  Recent Labs     01/08/20 2355 01/09/20 0314 01/09/20 0801  GLUCAP 100* 108* 117*     GFR: CrCl cannot be calculated (This lab value cannot be used to calculate CrCl because it is not a number: <0.30).  Coagulation Profile: No results for input(s): INR, PROTIME in the last 168 hours.  No results found for this or any previous visit (from the past 240 hour(s)).      Radiology Studies: No results found.      Scheduled Meds: . vitamin C  500 mg Oral BID  . chlorhexidine  15 mL Mouth Rinse BID  . Chlorhexidine Gluconate Cloth  6 each Topical Daily  . ferrous sulfate  325 mg Oral BID WC  . folic acid  1 mg Oral Daily  . heparin injection (subcutaneous)  5,000 Units Subcutaneous Q8H  . insulin aspart  0-6 Units Subcutaneous Q4H  . mouth rinse  15 mL Mouth Rinse q12n4p  . multivitamin with minerals  1 tablet Oral Daily  . pantoprazole sodium  40 mg Oral BID  . sodium chloride flush  10-40 mL Intracatheter Q12H  . thiamine  100 mg Oral Daily  . [START ON 01/10/2020] vitamin A  20,000 Units Oral Daily  . vitamin A  60,000 Units Oral Daily  . Vitamin D (Ergocalciferol)  50,000 Units Oral Q7 days  . zinc sulfate  220 mg Oral Daily   Continuous Infusions: . sodium chloride    . dextrose Stopped (01/09/20 0755)     LOS: 19 days     Cordelia Poche, MD [facility] 01/09/2020, 8:37 AM  If 7PM-7AM, please contact night-coverage [email or web address]

## 2020-01-09 NOTE — Discharge Instructions (Signed)
Wet to Dry WOUND CARE: - Change dressing three times daily - Supplies: sterile saline, kerlex, scissors, ABD pads, tape  1. Remove dressing and all packing carefully, moistening with sterile saline as needed to avoid packing/internal dressing sticking to the wound. 2.   Clean edges of skin around the wound with water/gauze, making sure there is no tape debris or leakage left on skin that could cause skin irritation or breakdown. 3.   Dampen and clean kerlex with sterile saline and pack wound from wound base to skin level, making sure to take note of any possible areas of wound tracking, tunneling and packing appropriately. Wound can be packed loosely. Trim kerlex to size if a whole kerlex is not required. 4.   Cover wound with a dry ABD pad and secure with tape.  5.   Write the date/time on the dry dressing/tape to better track when the last dressing change occurred. - apply any skin protectant/powder if recommended by clinician to protect skin/skin folds. - change dressing as needed if leakage occurs, wound gets contaminated, or patient requests to shower. - You may shower daily with wound open and following the shower the wound should be dried and a clean dressing placed.  - Medical grade tape as well as packing supplies can be found at [facility] on [address]. The remaining supplies can be found at your local drug store, [place] etc.

## 2020-01-09 NOTE — Progress Notes (Signed)
Modified Barium Swallow Progress Note  Patient Details  Name: [name] MRN: [id number] Date of Birth: 1970/11/13  Today's Date: 01/09/2020  Modified Barium Swallow completed.  Full report located under Chart Review in the Imaging Section.  Brief recommendations include the following:  Clinical Impression  Pt demonstrates moderate dysphagia with decreased airway protectiona nd decreased sensation. Pt has mild oral deficits including lingual pumping of pureed solids and struggle to initaite transit of all boluses (pt refused [place]ham). Pt fearfully releases the head of the boluses which spills to pyriforms to trigger more reflexive timely swallow response with piecemealed bolus. Pt had 100% tolerance of honey thick liquids and puree, but there were also episodes of mild backflow from the cervical/upper throacic esophagus to the pyriforms with thicker texture. There was one instance of silent trace aspiration of a large nectar bolus and one instance of silent aspiration of initial volitional spillage of thin liquids. Pt senses slight oral residual and clears with a final swallow. Pt is recommend to initiate a puree diet with nectar thick liquids if they can be given with small single sips (via spoon cup or straw). If any concern arises, downgrade to thicker honey texture.   Swallow Evaluation Recommendations       SLP Diet Recommendations: Dysphagia 1 (Puree) solids;Nectar thick liquid   Liquid Administration via: Cup;Straw   Medication Administration: Crushed with puree   Supervision: Full assist for feeding   Compensations: Slow rate;Small sips/bites;Minimize environmental distractions   Postural Changes: Seated upright at 90 degrees   Oral Care Recommendations: Oral care BID   Other Recommendations: Order thickener from [facility] Art Levan, [place] Pager [phone] Office [phone]  Lynann Beaver 01/09/2020,1:46  PM

## 2020-01-09 NOTE — Progress Notes (Signed)
MBS planned for 12:00 today. Please secure chat me or page [phone] if there is a problem with this plan.   Herbie [place], [place]  Acute Rehabilitation Services Office [phone]

## 2020-01-09 NOTE — Progress Notes (Signed)
Physical Therapy Treatment Patient Details Name: Alyssa Carey MRN: [id number] DOB: 11-04-1970 Today's Date: 01/09/2020    History of Present Illness 49 y/o female with a heavy alcohol history admitted6/8/21  in the setting of confusion and presumed alcohol withdrawal who later developed sepsis from a gas forming wound, S/P debridement left gluteal wound for necratizing fascitis.VDRF,    PT Comments    The patient is awake and following directions. Patient participated  In sitting upright in bed with use of rails. Able to support self in midline without support of bed. Participated in LE exercises, strength in legs is tested to not be strong enough to stand.   Recommend a [facility] Tilt bed if will meet criteria for wound on buttock and  Is allowed by company. Information given to [place], Therapist, sports. Continue PT for mobility.sign   Follow Up Recommendations  SNF     Equipment Recommendations   (tba)    Recommendations for Other Services       Precautions / Restrictions Precautions Precautions: Fall Precaution Comments: left gluteal wound, L colostomy, R abd drain from Lap incision-DRAIN BOTH BEFORE ROLLING    Mobility  Bed Mobility Overal bed mobility: Needs Assistance Bed Mobility: Supine to Sit Rolling: +2 for physical assistance;Total assist   Supine to sit: Min guard;Min assist     General bed mobility comments: Performed chair position in bed with patient pulling up on bed rails to promote trunk movement and strengthening. Unable to transfer at this time due to presence of slarge wound and stool.  Transfers                 General transfer comment: unable due to large wound  Ambulation/Gait                 Stairs             Wheelchair Mobility    Modified Rankin (Stroke Patients Only)       Balance Overall balance assessment: Needs assistance     Sitting balance - Comments: with bed in chair position and tillted foot down, patient pulled self  into sitting upright multiple times. HOB then lowered so patient was sitting without back support. Pt. sat for about 10 minutes with intermittent rest against  HOB.                                    Cognition Arousal/Alertness: Awake/alert Behavior During Therapy: WFL for tasks assessed/performed;Flat affect Overall Cognitive Status: Impaired/Different from baseline Area of Impairment: Orientation;Attention;Following commands                   Current Attention Level: Selective   Following Commands: Follows one step commands consistently       General Comments: initially stated january, then June,      Exercises General Exercises - Lower Extremity Ankle Circles/Pumps: AROM;Both;10 reps Heel Slides: AAROM;Left;10 reps;Supine Other Exercises Other Exercises: hip rotations x 10 x both    General Comments        Pertinent Vitals/Pain Pain Assessment: Faces Faces Pain Scale: Hurts little more Pain Location: BUTTOCK Pain Descriptors / Indicators: Discomfort Pain Intervention(s): Monitored during session    Home Living Family/patient expects to be discharged to:: Skilled nursing facility Living Arrangements: Alone                  Prior Function Level of Independence: Independent  Comments: Patient reports living in an apartment with 10-15 steps to get intoside. Independent at baseline without use of DME.   PT Goals (current goals can now be found in the care plan section) Acute Rehab PT Goals Patient Stated Goal: improve strength to be more functional Progress towards PT goals: Progressing toward goals    Frequency    Min 2X/week      PT Plan Current plan remains appropriate    Co-evaluation PT/OT/SLP Co-Evaluation/Treatment: Yes Reason for Co-Treatment: Complexity of the patient's impairments (multi-system involvement);For patient/therapist safety;To address functional/ADL transfers PT goals addressed during session:  Mobility/safety with mobility OT goals addressed during session: ADL's and self-care      AM-PAC PT "6 Clicks" Mobility   Outcome Measure  Help needed turning from your back to your side while in a flat bed without using bedrails?: Total Help needed moving from lying on your back to sitting on the side of a flat bed without using bedrails?: Total Help needed moving to and from a bed to a chair (including a wheelchair)?: Total Help needed standing up from a chair using your arms (e.g., wheelchair or bedside chair)?: Total Help needed to walk in hospital room?: Total Help needed climbing 3-5 steps with a railing? : Total 6 Click Score: 6    End of Session   Activity Tolerance: Patient tolerated treatment well Patient left: in bed;with call bell/phone within reach Nurse Communication: Mobility status;Need for lift equipment PT Visit Diagnosis: Muscle weakness (generalized) (M62.81)     Time: 0370-4888 PT Time Calculation (min) (ACUTE ONLY): 34 min  Charges:  $Therapeutic Activity: 8-22 mins                     Tresa Endo PT Acute Rehabilitation Services Pager [phone] Office [phone]    Claretha Cooper 01/09/2020, 12:38 PM

## 2020-01-09 NOTE — Evaluation (Signed)
Occupational Therapy Evaluation Patient Details Name: Alyssa Carey MRN: [id number] DOB: 08/26/1970 Today's Date: 01/09/2020    History of Present Illness 49 y/o female with a heavy alcohol history admitted6/8/21  in the setting of confusion and presumed alcohol withdrawal who later developed sepsis from a gas forming wound, S/P debridement left gluteal wound for necratizing fascitis.VDRF,   Clinical Impression   Alyssa Carey is a 49 year old woman admitted to hospital 12/23/19 who presents with profound weakness from prolonged hospitalization with difficulty raising upper extremities and moving lower extremities, decreased activity tolerance, obesity, left gluteal wound and pain resulting in significant decline in mobility and ability to perform self care tasks. Patient will benefit from skilled OT services to improve deficits in order to improve functional abilities. Patient will need short term rehab at discharge.    Follow Up Recommendations  SNF    Equipment Recommendations   (TBD)    Recommendations for Other Services       Precautions / Restrictions Precautions Precaution Comments: left gluteal wound Restrictions Weight Bearing Restrictions: No      Mobility Bed Mobility Overal bed mobility: Needs Assistance   Rolling: +2 for physical assistance;Total assist         General bed mobility comments: Performed chair position in bed with patient pulling up on bed rails to promote trunk movement and strengthening. Unable to transfer at this time due to presence of slarge wound and stool.  Transfers                 General transfer comment: unable due to large wound    Balance                                           ADL either performed or assessed with clinical judgement   ADL Overall ADL's : Needs assistance/impaired Eating/Feeding: Bed level;NPO   Grooming: Wash/dry face;Set up;Minimal assistance;Bed level Grooming Details  (indicate cue type and reason): Needed set up and min assist to reach forehead, decreased quality of task secondary to weakness, ability to grossly apply skin moisterizer Upper Body Bathing: Moderate assistance;Set up;Bed level   Lower Body Bathing: Total assistance;Set up;Bed level   Upper Body Dressing : Total assistance;Bed level;Set up   Lower Body Dressing: Total assistance;Bed level;Set up     Toilet Transfer Details (indicate cue type and reason): n/a Toileting- Clothing Manipulation and Hygiene: Bed level;Total assistance     Tub/Shower Transfer Details (indicate cue type and reason): n/a Functional mobility during ADLs: +2 for physical assistance;+2 for safety/equipment;Total assistance       Vision   Vision Assessment?: No apparent visual deficits     Perception     Praxis      Pertinent Vitals/Pain Pain Assessment: No/denies pain (no pain at rest)     Hand Dominance Right   Extremity/Trunk Assessment Upper Extremity Assessment Upper Extremity Assessment: Generalized weakness;RUE deficits/detail;LUE deficits/detail RUE Deficits / Details: grossly functional shoulder ROM, elbow 3+/5, wrist 4/5, grip 3+/5 RUE Sensation: WNL RUE Coordination:  (grossly functional) LUE Deficits / Details: AAROM to lift shoulder, 3+/5 elbow, wrist 4/5, 3/5 grip, edema in left hand LUE Sensation: WNL LUE Coordination:  (grossly functional)   Lower Extremity Assessment Lower Extremity Assessment: Defer to PT evaluation   Cervical / Trunk Assessment Cervical / Trunk Assessment: Normal   Communication Communication Communication: Expressive difficulties (soft hoarse voice -  at times to hear.)   Cognition Arousal/Alertness: Awake/alert Behavior During Therapy: WFL for tasks assessed/performed Overall Cognitive Status: Within Functional Limits for tasks assessed                                     General Comments       Exercises     Shoulder Instructions       Home Living Family/patient expects to be discharged to:: Skilled nursing facility Living Arrangements: Alone                                      Prior Functioning/Environment Level of Independence: Independent        Comments: Patient reports living in an apartment with 10-15 steps to get intoside. Independent at baseline without use of DME.        OT Problem List: Decreased strength;Decreased range of motion;Decreased activity tolerance;Pain;Decreased safety awareness;Obesity;Decreased knowledge of use of DME or AE      OT Treatment/Interventions: Self-care/ADL training;Therapeutic exercise;Neuromuscular education;DME and/or AE instruction;Therapeutic activities;Balance training;Patient/family education    OT Goals(Current goals can be found in the care plan section) Acute Rehab OT Goals Patient Stated Goal: improve strength to be more functional OT Goal Formulation: With patient Time For Goal Achievement: 01/22/20 Potential to Achieve Goals: Fair  OT Frequency: Min 2X/week   Barriers to D/C: Decreased caregiver support;Inaccessible home environment          Co-evaluation PT/OT/SLP Co-Evaluation/Treatment: Yes Reason for Co-Treatment: Complexity of the patient's impairments (multi-system involvement);For patient/therapist safety;To address functional/ADL transfers PT goals addressed during session: Mobility/safety with mobility;Strengthening/ROM OT goals addressed during session: ADL's and self-care;Strengthening/ROM      AM-PAC OT "6 Clicks" Daily Activity     Outcome Measure Help from another person eating meals?: A Lot Help from another person taking care of personal grooming?: A Lot Help from another person toileting, which includes using toliet, bedpan, or urinal?: Total Help from another person bathing (including washing, rinsing, drying)?: Total Help from another person to put on and taking off regular upper body clothing?: Total Help from  another person to put on and taking off regular lower body clothing?: Total 6 Click Score: 8   End of Session Nurse Communication: Mobility status  Activity Tolerance: Patient tolerated treatment well Patient left: in bed;with call bell/phone within reach  OT Visit Diagnosis: Muscle weakness (generalized) (M62.81)                Time: 2330-0762 OT Time Calculation (min): 31 min Charges:  OT General Charges $OT Visit: 1 Visit OT Evaluation $OT Eval High Complexity: 1 High  Jaxden Blyden, OTR/L [place]  Office [phone] Pager: [phone]   Lenward Chancellor 01/09/2020, 10:06 AM

## 2020-01-09 NOTE — Progress Notes (Signed)
Nutrition Follow-up  DOCUMENTATION CODES:   Obesity unspecified  INTERVENTION:   -Vital Cuisine shakes BID, each provides 520 kcals and 22g protein (are nectar thick in consistency)  -Continue supplementation of Vitamin A, noted dose of 60,000 IU x 3 days -6/27-7/10 20,000 IU x 14 days  NUTRITION DIAGNOSIS:   Increased nutrient needs related to chronic illness, wound healing (alcohol abuse; necrotizing fasciitis) as evidenced by estimated needs.  Ongoing.  GOAL:   Patient will meet greater than or equal to 90% of their needs  Will assess now that pt is on diet now.  MONITOR:   Diet advancement, Labs, Weight trends, Skin, I & O's  ASSESSMENT:   Pt with PMH of heavy alcohol history who for one week with decreased intake admitted 6/6 with alcoholic hepatitis with hepatic encephalopathy and AKI.  Significant Events: 6/7-admission; initial RD assessment 6/8-Rapid Response; CT pelvis to r/o necrotizing fascitis--found to be positive for necrotizing fascitis and went to OR for I&D 6/9-remained intubated with OGT in place; started TF; added ascorbic acid BID and zinc once/day.  6/10-return to OR for I&D; lap loop colostomy 6/16-vomiting overnight; TF held and OGT to LIS 6/17-vomiting overnight; OGT removed d/t aspiration event and being partly pulled; small bore NGT placed 6/18-restarted TF at trickle rate 6/21- small bore NGT removed; OGT placed 6/23 -extubated, OGT removed  MBS performed today. Per SLP note, pt's diet to advanced to dysphagia 1 diet with nectar thick liquids. Recommend pt consume liquids with small single sips via spoon or straw.   RD to order [employer] Vital Cuisine shakes given these are nectar thick in consistency, will come on meal trays. If PO is poor, will need to add Prostat supplements to help pt meet elevated protein needs. Will continue to monitor intakes and repletion of Vitamin A.  Pt's lunch meal provides 986 kcals and 46g  protein.  Admission weight: 213 lbs. Current weight (6/24): 245 lbs.   Medications: Vitamin C,  Ferrous sulfate, Folic acid, Multivitamin with minerals daily, Thiamine, zinc sulfate , D5 infusion  Labs reviewed:  CBGs: 117-118  Diet Order:   Diet Order            DIET - DYS 1 Room service appropriate? Yes; Fluid consistency: Nectar Thick  Diet effective now                 EDUCATION NEEDS:   Not appropriate for education at this time  Skin:  Skin Assessment:  (necrotizing fasciitis to L buttock) Skin Integrity Issues:: DTI DTI: R ear Stage II: NA Unstageable: NA Incisions: L buttocks (6/8 + 6/10); abdomen (6/10)  Last BM:  6/25 (colostomy)  Height:   Ht Readings from Last 1 Encounters:  01/05/20 5' 4"  (1.626 m)    Weight:   Wt Readings from Last 1 Encounters:  01/08/20 111.3 kg    Ideal Body Weight:  54.5 kg  BMI:  Body mass index is 42.12 kg/m.  Estimated Nutritional Needs:   Kcal:  2500-2700 kcal  Protein:  160-175 grams  Fluid:  >/= 2.3 L/day  Clayton Bibles, MS, RD, LDN Inpatient Clinical Dietitian Contact information available via Amion

## 2020-01-10 ENCOUNTER — Inpatient Hospital Stay (HOSPITAL_COMMUNITY): Payer: Medicaid Other

## 2020-01-10 LAB — BASIC METABOLIC PANEL
Anion gap: 2 — ABNORMAL LOW (ref 5–15)
Anion gap: 5 (ref 5–15)
BUN: 12 mg/dL (ref 6–20)
BUN: 10 mg/dL (ref 6–20)
CO2: 30 mmol/L (ref 22–32)
CO2: 27 mmol/L (ref 22–32)
Calcium: 8.1 mg/dL — ABNORMAL LOW (ref 8.9–10.3)
Calcium: 7.5 mg/dL — ABNORMAL LOW (ref 8.9–10.3)
Chloride: 120 mmol/L — ABNORMAL HIGH (ref 98–111)
Chloride: 113 mmol/L — ABNORMAL HIGH (ref 98–111)
Creatinine, Ser: <0.3 mg/dL — ABNORMAL LOW (ref 0.44–1.00)
Creatinine, Ser: <0.3 mg/dL — ABNORMAL LOW (ref 0.44–1.00)
Glucose, Bld: 134 mg/dL — ABNORMAL HIGH (ref 70–99)
Glucose, Bld: 287 mg/dL — ABNORMAL HIGH (ref 70–99)
Potassium: 2.9 mmol/L — ABNORMAL LOW (ref 3.5–5.1)
Potassium: 3.7 mmol/L (ref 3.5–5.1)
Sodium: 152 mmol/L — ABNORMAL HIGH (ref 135–145)
Sodium: 145 mmol/L (ref 135–145)

## 2020-01-10 LAB — GLUCOSE, CAPILLARY
Glucose-Capillary: 121 mg/dL — ABNORMAL HIGH (ref 70–99)
Glucose-Capillary: 115 mg/dL — ABNORMAL HIGH (ref 70–99)
Glucose-Capillary: 121 mg/dL — ABNORMAL HIGH (ref 70–99)
Glucose-Capillary: 105 mg/dL — ABNORMAL HIGH (ref 70–99)
Glucose-Capillary: 188 mg/dL — ABNORMAL HIGH (ref 70–99)

## 2020-01-10 LAB — CBC
HCT: 23.4 % — ABNORMAL LOW (ref 36.0–46.0)
Hemoglobin: 6.9 g/dL — CL (ref 12.0–15.0)
MCH: 32.7 pg (ref 26.0–34.0)
MCHC: 29.5 g/dL — ABNORMAL LOW (ref 30.0–36.0)
MCV: 110.9 fL — ABNORMAL HIGH (ref 80.0–100.0)
Platelets: 498 10*3/uL — ABNORMAL HIGH (ref 150–400)
RBC: 2.11 MIL/uL — ABNORMAL LOW (ref 3.87–5.11)
RDW: 24.1 % — ABNORMAL HIGH (ref 11.5–15.5)
WBC: 14.7 10*3/uL — ABNORMAL HIGH (ref 4.0–10.5)
nRBC: 0.4 % — ABNORMAL HIGH (ref 0.0–0.2)

## 2020-01-10 LAB — HEMOGLOBIN AND HEMATOCRIT, BLOOD
HCT: 23.4 % — ABNORMAL LOW (ref 36.0–46.0)
Hemoglobin: 7.1 g/dL — ABNORMAL LOW (ref 12.0–15.0)

## 2020-01-10 LAB — OCCULT BLOOD X 1 CARD TO LAB, STOOL: Fecal Occult Bld: POSITIVE — AB

## 2020-01-10 LAB — PREPARE RBC (CROSSMATCH)

## 2020-01-10 MED ORDER — SODIUM CHLORIDE 0.9% IV SOLUTION: Freq: Once | INTRAVENOUS | Status: AC

## 2020-01-10 MED ORDER — PANTOPRAZOLE SODIUM 40 MG PO TBEC
40.0000 mg | DELAYED_RELEASE_TABLET | Freq: Two times a day (BID) | ORAL | Status: DC
  Administered 2020-01-10 – 2020-01-13 (×6): 40 mg via ORAL
  Filled 2020-01-10 (×6): qty 1

## 2020-01-10 MED ORDER — POTASSIUM CHLORIDE 10 MEQ/100ML IV SOLN
10.0000 meq | INTRAVENOUS | Status: AC
  Administered 2020-01-10 (×6): 10 meq via INTRAVENOUS
  Filled 2020-01-10 (×6): qty 100

## 2020-01-10 MED ORDER — POTASSIUM CHLORIDE CRYS ER 20 MEQ PO TBCR
40.0000 meq | EXTENDED_RELEASE_TABLET | ORAL | Status: DC
  Filled 2020-01-10: qty 2

## 2020-01-10 NOTE — Progress Notes (Signed)
PROGRESS NOTE    Alyssa Carey  SNK:[id number] DOB: 06-08-1971 DOA: 12/21/2019 PCP: Patient, No Pcp Per   Brief Narrative: Alyssa Carey is a 49 y.o. female that presented secondary to concern for alcohol withdrawal and quickly developed evidence of necrotizing fasciitis.  This was emergently managed in the OR by general surgery via I&D. Patient required intubation, vasopressor support and ICU admission for septic shock. She underwent repeat I&D and diverting colostomy. She has now been extubate and is off vasopressors. Hydrotherapy started.    ICU significant events: 6/06 admission 6/08 ICU admission to OR ofr I&D, back intubated and on pressors, 6/09 started tube feeds. Still pressor dependent, kept on vent w/ anticipated return to OR planned  6/10 Back to the OR for significant debridement and also placement of diverting colostomy 6/11 Sedated, low-dose phenylephrine. CCS felt wound looking good.  Sedation changed to precedex / PRN fent  6/12 more awake, on 15/5 pressure support 6/13 family discussion >> DNR, continue medical care 6//15 transfuse 1 unit PRBC; vomiting, tube feeds held 6/16 Vomiting overnight 6/17 Trial PSV 15/5 with variable Vt, remains on vasopressors.  Vomiting overnight  6/18 transfuse 1 unit PRBC 6/20 Off pressors,  On precedex and weaning on PSV  6/21 Weaning on PSV, planned hydrotherapy  6/24 Transferred off of ICU service   Assessment & Plan:   Principal Problem:   Necrotizing fasciitis of pelvic region and thigh [facility]) Active Problems:   Fall at home, initial encounter   Weakness   Alcoholism ([place])   Thrombocytopenia ([place])   ARF (acute renal failure) ([place])   Hepatic encephalopathy ([place])   Alcoholic liver disease ([place])   Pressure injury of skin   Elevated LFTs   Acute respiratory failure with hypoxemia ([place])   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  General surgery was consulted and patient was emergently sent to the OR for incision and debridement.  Antibiotics as mentioned below.  Patient underwent repeat incision and drainage in addition to diverting loop colostomy to aid in healing of the wound.  Patient has now started hydrotherapy. Bleeding of wound overnight which has resolved.   Septic shock Secondary to necrotizing fasciitis.  Patient required ICU admission, intubation, vasopressor support.  Patient was initially managed on vancomycin, clindamycin, ceftriaxone.  Vancomycin was eventually transitioned to linezolid and clindamycin transition to Flagyl.  Patient was treated with antibiotics from 6/7 until 6/22. Culture data from wound significant for multiple organisms: E. coli, vancomycin resistant Enterococcus  Acute respiratory failure with hypoxia Patient required mechanical ventilation from 6/8 until 6/23.  Patient now requiring 2 L via nasal cannula. -Continue to wean to room air as able  Hypernatremia Secondary to poor free water intake.  Urine sodium osmolality obtain which was significant for an appropriately high urine osmolality of 706. Improving slowly. -Continue D5 IV fluids; -Serial BMP  Malnutrition Not present on admission. Secondary to critical illness.  Patient was initially on tube feeds while intubated.  OG tube was removed.  Speech therapy has evaluated at bedside in addition to modified barium swallow and recommends a dysphagia 1 diet. Albumin of 1.4 -Dysphagia 1 diet; will need to monitor intake carefully. May still require NG tube and tube feeds. Discussed with patient.  Anemia of critical illness Acute blood loss anemia Baseline hemoglobin of 12-13.  Patient developed anemia in setting of severe infection/wound in  addition to multiple surgical interventions.  Patient has received 3 units of PRBC to date.  Hemoglobin currently  stable.  Bleeding from wound overnight (6/26). Acute drop of hemoglobin to 6.9. -1 unit of PRBC -Post transfusion H&H  Thrombocytopenia Present early in admission.  In setting of critical illness in addition to alcohol use and liver disease.  Patient received 2 units of platelets.  Currently resolved.  Hypokalemia -Replete as needed; will need to replete via IV secondary to poor ability to tolerate pills  Hyperglycemia Hemoglobin A1c of 5.7.  Patient meets criteria for prediabetes.  Recommend diet modification for treatment.  Could consider metformin as well as an outpatient.  With patient's current weight, high likelihood weight loss could prevent the need for any medication management.   Alcoholic hepatitis Hepatic encephalopathy Present on admission.  Now resolved.  Was previously on lactulose which has been held in light of current buttock wound.  AKI Patient with unknown baseline.  Patient with a creatinine of 1.74 on admission.  AKI currently resolved.  Morbid obesity Body mass index is 43.75 kg/m.  Pressure injury Deep tissue injury of right ear, not present on admission   DVT prophylaxis: Heparin subcutaneous Code Status:   Code Status: DNR Family Communication: None at bedside.  Patient declined for me to call family Disposition Plan: Anticipate patient will likely discharge to LTAC.  We will start process.  Patient will be receiving continued hydrotherapy per PT.  Patient's hyponatremia also needs to be corrected prior to discharge.   Consultants:   PCCM  General surgery  Procedures:   ENDOTRACHEAL INTUBATION (6/8 >> 01/07/2020)  inCISION AND DEBRIDEMENT OF LEFT BUTTOCK AND PERINEUM (12/23/2019)  IRRIGATION AND DEBRIDEMENT OF BUTTOCKS; LAPAROSCOPIC LOOP COLOSTOMY (12/25/2019)    Antimicrobials:  Ceftriaxone  Vancomycin  Clindamycin  Linezolid  Flagyl   Subjective: No concerns. States she still had bowel movements overnight  Objective: Vitals:     01/10/20 0300 01/10/20 0400 01/10/20 0500 01/10/20 0832  BP:  134/86    Pulse: (!) 122 (!) 127    Resp: (!) 35 (!) 38    Temp:  97.9 F (36.6 C)  97.8 F (36.6 C)  TempSrc:  Axillary  Axillary  SpO2: 100% 100%    Weight:   115.6 kg   Height:        Intake/Output Summary (Last 24 hours) at 01/10/2020 0929 Last data filed at 01/10/2020 0800 Gross per 24 hour  Intake 1722.68 ml  Output 1065 ml  Net 657.68 ml   Filed Weights   01/08/20 0500 01/09/20 2007 01/10/20 0500  Weight: 111.3 kg 115.1 kg 115.6 kg    Examination:  General exam: Appears calm and comfortable Respiratory system: Mild rhonchi on auscultation. Respiratory effort normal. Cardiovascular system: S1 & S2 heard, Tachycardia. No murmurs, rubs, gallops or clicks. Gastrointestinal system: Abdomen is nondistended, soft and nontender. No organomegaly or masses felt. Normal bowel sounds heard. Ostomy bag full with liquid brown stool Central nervous system: Alert.  Musculoskeletal: No edema. No calf tenderness Skin: No cyanosis. No rashes Psychiatry: Judgement and insight appear normal.    Data Reviewed: I have personally reviewed following labs and imaging studies  CBC Lab Results  Component Value Date   WBC 14.7 (H) 01/10/2020   RBC 2.11 (L) 01/10/2020   HGB 6.9 (LL) 01/10/2020   HCT 23.4 (L) 01/10/2020   MCV 110.9 (H) 01/10/2020   MCH 32.7 01/10/2020   PLT 498 (H) 01/10/2020   MCHC 29.5 (L) 01/10/2020  RDW 24.1 (H) 01/10/2020   LYMPHSABS 3.4 12/28/2019   MONOABS 0.8 12/28/2019   EOSABS 0.2 12/28/2019   BASOSABS 0.1 81/19/1478     Last metabolic panel Lab Results  Component Value Date   NA 152 (H) 01/10/2020   K 2.9 (L) 01/10/2020   CL 120 (H) 01/10/2020   CO2 30 01/10/2020   BUN 12 01/10/2020   CREATININE <0.30 (L) 01/10/2020   GLUCOSE 134 (H) 01/10/2020   GFRNONAA NOT CALCULATED 01/10/2020   GFRAA NOT CALCULATED 01/10/2020   CALCIUM 8.1 (L) 01/10/2020   PHOS 3.3 12/30/2019   PROT 5.9  (L) 01/02/2020   ALBUMIN 1.4 (L) 01/02/2020   BILITOT 1.0 01/02/2020   ALKPHOS 117 01/02/2020   AST 39 01/02/2020   ALT 21 01/02/2020   ANIONGAP 2 (L) 01/10/2020    CBG (last 3)  Recent Labs    01/09/20 2358 01/10/20 0346 01/10/20 0738  GLUCAP 116* 121* 115*     GFR: CrCl cannot be calculated (This lab value cannot be used to calculate CrCl because it is not a number: <0.30).  Coagulation Profile: No results for input(s): INR, PROTIME in the last 168 hours.  No results found for this or any previous visit (from the past 240 hour(s)).      Radiology Studies: DG Swallowing Func-Speech Pathology  Result Date: 01/09/2020 Objective Swallowing Evaluation: Type of Study: MBS-Modified Barium Swallow Study  Patient Details Name: ELIANNE GUBSER MRN: [id number] Date of Birth: 1971-02-20 Today's Date: 01/09/2020 Time: SLP Start Time (ACUTE ONLY): 1230 -SLP Stop Time (ACUTE ONLY): 1246 SLP Time Calculation (min) (ACUTE ONLY): 16 min Past Medical History: No past medical history on file. Past Surgical History: Past Surgical History: Procedure Laterality Date . INCISION AND DRAINAGE PERIRECTAL ABSCESS N/A 12/25/2019  Procedure: IRRIGATION AND DEBRIDEMENT BUTTOCKS, LAP LOOP COLOSTOMY;  Surgeon: Greer Pickerel, MD;  Location: [facility];  Service: General;  Laterality: N/A; . IRRIGATION AND DEBRIDEMENT ABSCESS N/A 12/23/2019  Procedure: EXCISION AND DEBRIDEMENT LEFT BUTTOCK AND PERINEUM;  Surgeon: Clovis Riley, MD;  Location: [facility];  Service: General;  Laterality: N/A; . LAPAROSCOPIC LOOP COLOSTOMY N/A 12/25/2019  Procedure: LAPAROSCOPIC LOOP COLOSTOMY;  Surgeon: Greer Pickerel, MD;  Location: [facility];  Service: General;  Laterality: N/A; HPI: 49 year old with alcohol abuse, alcohol withdrawal, necrotizing fasciitis of left buttock status post debridement. Acute respiratory failure secondary to septic shock. Intubated from 6/8 to 6/23.  No data recorded Assessment / Plan / Recommendation CHL IP CLINICAL  IMPRESSIONS 01/09/2020 Clinical Impression Pt demonstrates moderate dysphagia with decreased airway protectiona nd decreased sensation. Pt has mild oral deficits including lingual pumping of pureed solids and struggle to initaite transit of all boluses (pt refused graham). Pt fearfully releases the head of the boluses which spills to pyriforms to trigger more reflexive timely swallow response with piecemealed bolus. Pt had 100% tolerance of honey thick liquids and puree, but there were also episodes of mild backflow from the cervical/upper throacic esophagus to the pyriforms with thicker texture. There was one instance of silent trace aspiration of a large nectar bolus and one instance of silent aspiration of initial volitional spillage of thin liquids. Pt senses slight oral residual and clears with a final swallow. Pt is recommend to initiate a puree diet with nectar thick liquids if they can be given with small single sips (via spoon cup or straw). If any concern arises, downgrade to thicker honey texture. SLP Visit Diagnosis Dysphagia, oropharyngeal phase (R13.12) Attention and concentration deficit following -- Frontal  lobe and executive function deficit following -- Impact on safety and function Moderate aspiration risk   CHL IP TREATMENT RECOMMENDATION 01/09/2020 Treatment Recommendations Therapy as outlined in treatment plan below   Prognosis 01/09/2020 Prognosis for Safe Diet Advancement Fair Barriers to Reach Goals Severity of deficits Barriers/Prognosis Comment -- CHL IP DIET RECOMMENDATION 01/09/2020 SLP Diet Recommendations Dysphagia 1 (Puree) solids;Nectar thick liquid Liquid Administration via Cup;Straw Medication Administration Crushed with puree Compensations Slow rate;Small sips/bites;Minimize environmental distractions Postural Changes Seated upright at 90 degrees   CHL IP OTHER RECOMMENDATIONS 01/09/2020 Recommended Consults -- Oral Care Recommendations Oral care BID Other Recommendations Order  thickener from pharmacy   CHL IP FOLLOW UP RECOMMENDATIONS 01/09/2020 Follow up Recommendations Skilled Nursing facility   [facility] IP FREQUENCY AND DURATION 01/09/2020 Speech Therapy Frequency (ACUTE ONLY) min 2x/week Treatment Duration 2 weeks      CHL IP ORAL PHASE 01/09/2020 Oral Phase Impaired Oral - Pudding Teaspoon -- Oral - Pudding Cup -- Oral - Honey Teaspoon -- Oral - Honey Cup Lingual pumping;Holding of bolus;Reduced posterior propulsion;Piecemeal swallowing;Lingual/palatal residue;Delayed oral transit;Decreased bolus cohesion Oral - Nectar Teaspoon Lingual pumping;Holding of bolus;Reduced posterior propulsion;Piecemeal swallowing;Lingual/palatal residue;Delayed oral transit;Decreased bolus cohesion Oral - Nectar Cup Lingual pumping;Holding of bolus;Reduced posterior propulsion;Piecemeal swallowing;Lingual/palatal residue;Delayed oral transit;Decreased bolus cohesion Oral - Nectar Straw Lingual pumping;Holding of bolus;Reduced posterior propulsion;Piecemeal swallowing;Lingual/palatal residue;Delayed oral transit;Decreased bolus cohesion Oral - Thin Teaspoon -- Oral - Thin Cup Lingual pumping;Holding of bolus;Reduced posterior propulsion;Piecemeal swallowing;Lingual/palatal residue;Delayed oral transit;Decreased bolus cohesion Oral - Thin Straw Lingual pumping;Holding of bolus;Reduced posterior propulsion;Piecemeal swallowing;Lingual/palatal residue;Delayed oral transit;Decreased bolus cohesion Oral - Puree Lingual pumping;Holding of bolus;Reduced posterior propulsion;Piecemeal swallowing;Lingual/palatal residue;Delayed oral transit;Decreased bolus cohesion Oral - Mech Soft -- Oral - Regular NT Oral - Multi-Consistency -- Oral - Pill -- Oral Phase - Comment --  CHL IP PHARYNGEAL PHASE 01/09/2020 Pharyngeal Phase Impaired Pharyngeal- Pudding Teaspoon -- Pharyngeal -- Pharyngeal- Pudding Cup -- Pharyngeal -- Pharyngeal- Honey Teaspoon -- Pharyngeal -- Pharyngeal- Honey Cup Delayed swallow initiation-vallecula  Pharyngeal -- Pharyngeal- Nectar Teaspoon Delayed swallow initiation-vallecula;Delayed swallow initiation-pyriform sinuses Pharyngeal -- Pharyngeal- Nectar Cup Delayed swallow initiation-pyriform sinuses;Delayed swallow initiation-vallecula Pharyngeal -- Pharyngeal- Nectar Straw Delayed swallow initiation-pyriform sinuses;Penetration/Aspiration during swallow Pharyngeal Material enters airway, passes BELOW cords without attempt by patient to eject out (silent aspiration) Pharyngeal- Thin Teaspoon -- Pharyngeal -- Pharyngeal- Thin Cup Delayed swallow initiation-pyriform sinuses Pharyngeal -- Pharyngeal- Thin Straw Delayed swallow initiation-pyriform sinuses;Penetration/Aspiration before swallow;Trace aspiration Pharyngeal Material enters airway, passes BELOW cords without attempt by patient to eject out (silent aspiration) Pharyngeal- Puree -- Pharyngeal -- Pharyngeal- Mechanical Soft -- Pharyngeal -- Pharyngeal- Regular -- Pharyngeal -- Pharyngeal- Multi-consistency -- Pharyngeal -- Pharyngeal- Pill -- Pharyngeal -- Pharyngeal Comment --  No flowsheet data found. Herbie Baltimore, MA CCC-SLP Acute Rehabilitation Services Pager [phone] Office [phone] Lynann Beaver 01/09/2020, 1:47 PM                   Scheduled Meds: . vitamin C  500 mg Oral BID  . chlorhexidine  15 mL Mouth Rinse BID  . Chlorhexidine Gluconate Cloth  6 each Topical Daily  . ferrous sulfate  325 mg Oral BID WC  . folic acid  1 mg Oral Daily  . heparin injection (subcutaneous)  5,000 Units Subcutaneous Q8H  . insulin aspart  0-6 Units Subcutaneous Q4H  . mouth rinse  15 mL Mouth Rinse q12n4p  . multivitamin with minerals  1 tablet Oral Daily  . pantoprazole sodium  40 mg Oral BID  .  potassium chloride  40 mEq Oral Q4H  . sodium chloride flush  10-40 mL Intracatheter Q12H  . thiamine  100 mg Oral Daily  . vitamin A  20,000 Units Oral Daily  . vitamin A  60,000 Units Oral Daily  . Vitamin D (Ergocalciferol)   50,000 Units Oral Q7 days  . zinc sulfate  220 mg Oral Daily   Continuous Infusions: . sodium chloride    . dextrose 75 mL/hr at 01/10/20 0800     LOS: 20 days     Cordelia Poche, MD [facility] 01/10/2020, 9:29 AM  If 7PM-7AM, please contact night-coverage [email or web address]

## 2020-01-10 NOTE — Progress Notes (Signed)
RN paged MD for bloody colostomy output. Fecal occult ordered per [place] MD. Fecal occult positive. MD aware.   RN changed wound dressing- continuous bleeding at site with large blood clots- surgery paged. No new orders at this time.

## 2020-01-11 ENCOUNTER — Encounter (HOSPITAL_COMMUNITY)
Admission: EM | Disposition: A | Discharge status: Skilled Nursing Facility | Payer: Self-pay | Source: Home / Self Care | Attending: Internal Medicine

## 2020-01-11 ENCOUNTER — Encounter (HOSPITAL_COMMUNITY): Payer: Self-pay | Admitting: Internal Medicine

## 2020-01-11 HISTORY — PX: FLEXIBLE SIGMOIDOSCOPY: SHX5431

## 2020-01-11 LAB — BASIC METABOLIC PANEL
Anion gap: 5 (ref 5–15)
BUN: 10 mg/dL (ref 6–20)
CO2: 27 mmol/L (ref 22–32)
Calcium: 7.8 mg/dL — ABNORMAL LOW (ref 8.9–10.3)
Chloride: 115 mmol/L — ABNORMAL HIGH (ref 98–111)
Creatinine, Ser: <0.3 mg/dL — ABNORMAL LOW (ref 0.44–1.00)
Glucose, Bld: 109 mg/dL — ABNORMAL HIGH (ref 70–99)
Potassium: 2.7 mmol/L — CL (ref 3.5–5.1)
Sodium: 147 mmol/L — ABNORMAL HIGH (ref 135–145)

## 2020-01-11 LAB — BPAM RBC
Blood Product Expiration Date: 202107062359
Blood Product Expiration Date: 202107072359
ISSUE DATE / TIME: 202106240829
ISSUE DATE / TIME: 202106261313
Unit Type and Rh: 6200
Unit Type and Rh: 6200

## 2020-01-11 LAB — URINALYSIS, ROUTINE W REFLEX MICROSCOPIC
Bilirubin Urine: NEGATIVE
Glucose, UA: NEGATIVE mg/dL
Hgb urine dipstick: NEGATIVE
Ketones, ur: NEGATIVE mg/dL
Nitrite: NEGATIVE
Protein, ur: 30 mg/dL — AB
Specific Gravity, Urine: 1.023 (ref 1.005–1.030)
WBC, UA: >50 WBC/hpf — ABNORMAL HIGH (ref 0–5)
pH: 5 (ref 5.0–8.0)

## 2020-01-11 LAB — PROTIME-INR
INR: 1.2 (ref 0.8–1.2)
Prothrombin Time: 14.9 seconds (ref 11.4–15.2)

## 2020-01-11 LAB — GLUCOSE, CAPILLARY
Glucose-Capillary: 106 mg/dL — ABNORMAL HIGH (ref 70–99)
Glucose-Capillary: 106 mg/dL — ABNORMAL HIGH (ref 70–99)
Glucose-Capillary: 117 mg/dL — ABNORMAL HIGH (ref 70–99)
Glucose-Capillary: 121 mg/dL — ABNORMAL HIGH (ref 70–99)
Glucose-Capillary: 80 mg/dL (ref 70–99)
Glucose-Capillary: 89 mg/dL (ref 70–99)
Glucose-Capillary: 89 mg/dL (ref 70–99)

## 2020-01-11 LAB — TYPE AND SCREEN
ABO/RH(D): A POS
Antibody Screen: NEGATIVE
Unit division: 0
Unit division: 0

## 2020-01-11 LAB — POTASSIUM
Potassium: 6.7 mmol/L (ref 3.5–5.1)
Potassium: 3.4 mmol/L — ABNORMAL LOW (ref 3.5–5.1)

## 2020-01-11 LAB — MAGNESIUM: Magnesium: 1.8 mg/dL (ref 1.7–2.4)

## 2020-01-11 LAB — TSH: TSH: 2.739 u[IU]/mL (ref 0.350–4.500)

## 2020-01-11 LAB — CBC
HCT: 25.1 % — ABNORMAL LOW (ref 36.0–46.0)
Hemoglobin: 7.6 g/dL — ABNORMAL LOW (ref 12.0–15.0)
MCH: 31 pg (ref 26.0–34.0)
MCHC: 30.3 g/dL (ref 30.0–36.0)
MCV: 102.4 fL — ABNORMAL HIGH (ref 80.0–100.0)
Platelets: 488 10*3/uL — ABNORMAL HIGH (ref 150–400)
RBC: 2.45 MIL/uL — ABNORMAL LOW (ref 3.87–5.11)
RDW: 28.1 % — ABNORMAL HIGH (ref 11.5–15.5)
WBC: 14.5 10*3/uL — ABNORMAL HIGH (ref 4.0–10.5)
nRBC: 0.1 % (ref 0.0–0.2)

## 2020-01-11 SURGERY — SIGMOIDOSCOPY, FLEXIBLE

## 2020-01-11 MED ORDER — FENTANYL CITRATE (PF) 100 MCG/2ML IJ SOLN
INTRAMUSCULAR | Status: AC
  Filled 2020-01-11: qty 6

## 2020-01-11 MED ORDER — POTASSIUM CHLORIDE CRYS ER 20 MEQ PO TBCR
40.0000 meq | EXTENDED_RELEASE_TABLET | Freq: Two times a day (BID) | ORAL | Status: DC
  Administered 2020-01-11: 40 meq via ORAL
  Filled 2020-01-11: qty 2

## 2020-01-11 MED ORDER — POTASSIUM CHLORIDE 10 MEQ/100ML IV SOLN
10.0000 meq | INTRAVENOUS | Status: AC
  Administered 2020-01-11 (×4): 10 meq via INTRAVENOUS
  Filled 2020-01-11 (×4): qty 100

## 2020-01-11 MED ORDER — MIDAZOLAM HCL (PF) 5 MG/ML IJ SOLN
INTRAMUSCULAR | Status: AC
  Filled 2020-01-11: qty 3

## 2020-01-11 MED ORDER — POTASSIUM CHLORIDE 2 MEQ/ML IV SOLN
INTRAVENOUS | Status: DC
  Filled 2020-01-11 (×2): qty 1000

## 2020-01-11 MED ORDER — HYDROMORPHONE HCL 2 MG/ML IJ SOLN
INTRAMUSCULAR | Status: AC
  Filled 2020-01-11: qty 1

## 2020-01-11 MED ORDER — HYDROMORPHONE HCL 2 MG/ML IJ SOLN
2.0000 mg | Freq: Once | INTRAMUSCULAR | Status: AC
  Administered 2020-01-11: 2 mg via INTRAVENOUS

## 2020-01-11 NOTE — Progress Notes (Addendum)
CRITICAL VALUE ALERT  Critical Value:  Potassium 2.7  Date & Time Notied:  01-11-20 @ 0903  Provider Notified: Kennon Holter, NP  Orders Received/Actions taken: see new orders

## 2020-01-11 NOTE — Progress Notes (Signed)
Assessment & Plan: Necrotizing fasciitis left buttock - initial debridement 12/23/2019 - Dr. Romana Juniper  Bleeding from wound improved with holding anticoagulation  Dressing changed with nursing staff this AM  Good granulation on most of wound, small necrosis Loop descending colostomy for diversion - 12/25/2019 - Dr. Greer Pickerel Bleeding per rectum  Large volume dark clot from rectum at dressing change, no active bleeding  Will ask GI to evaluate  Continue to hold anticoagulation  Will follow with you.        Armandina Gemma, MD       [facility]       Office: [phone]   Chief Complaint: Necrotizing fasciitis left buttock  Subjective: Patient in bed, stepdown unit.  Nursing at bedside.  Objective: Vital signs in last 24 hours: Temp:  [97.8 F (36.6 C)-99 F (37.2 C)] 98.1 F (36.7 C) (06/27 0907) Pulse Rate:  [95-125] 125 (06/27 0700) Resp:  [17-38] 31 (06/27 0700) BP: (103-147)/(47-106) 126/103 (06/27 0700) SpO2:  [99 %-100 %] 100 % (06/27 0700) Weight:  [114.1 kg] 114.1 kg (06/27 0433) Last BM Date: 01/11/20  Intake/Output from previous day: 06/26 0701 - 06/27 0700 In: 2827.8 [P.O.:160; I.V.:1692.2; Blood:300; IV Piggyback:675.6] Out: 1625 [Urine:450; Stool:950] Intake/Output this shift: No intake/output data recorded.  Physical Exam: Left buttock - wound widely open with developing granulation tissue on 90% of wound; small necrotic tissue superiorly and medially. Rectum - large volume dark thrombus evacuated from rectum, no active bleeding, no mass  Lab Results:  Recent Labs    01/09/20 1952 01/09/20 1952 01/10/20 0500 01/10/20 1730  WBC 13.7*  --  14.7*  --   HGB 8.7*   < > 6.9* 7.1*  HCT 28.7*   < > 23.4* 23.4*  PLT 517*  --  498*  --    < > = values in this interval not displayed.   BMET Recent Labs    01/10/20 1730 01/11/20 0343  NA 145 147*  K 3.7 2.7*  CL 113* 115*  CO2 27 27  GLUCOSE 287* 109*  BUN 10 10    CREATININE <0.30* <0.30*  CALCIUM 7.5* 7.8*   PT/INR Recent Labs    01/11/20 0343  LABPROT 14.9  INR 1.2   Comprehensive Metabolic Panel:    Component Value Date/Time   NA 147 (H) 01/11/2020 0343   NA 145 01/10/2020 1730   K 2.7 (LL) 01/11/2020 0343   K 3.7 01/10/2020 1730   CL 115 (H) 01/11/2020 0343   CL 113 (H) 01/10/2020 1730   CO2 27 01/11/2020 0343   CO2 27 01/10/2020 1730   BUN 10 01/11/2020 0343   BUN 10 01/10/2020 1730   CREATININE <0.30 (L) 01/11/2020 0343   CREATININE <0.30 (L) 01/10/2020 1730   GLUCOSE 109 (H) 01/11/2020 0343   GLUCOSE 287 (H) 01/10/2020 1730   CALCIUM 7.8 (L) 01/11/2020 0343   CALCIUM 7.5 (L) 01/10/2020 1730   AST 39 01/02/2020 0459   AST 43 (H) 12/31/2019 0530   ALT 21 01/02/2020 0459   ALT 23 12/31/2019 0530   ALKPHOS 117 01/02/2020 0459   ALKPHOS 157 (H) 12/31/2019 0530   BILITOT 1.0 01/02/2020 0459   BILITOT 1.0 12/31/2019 0530   PROT 5.9 (L) 01/02/2020 0459   PROT 6.3 (L) 12/31/2019 0530   ALBUMIN 1.4 (L) 01/02/2020 0459   ALBUMIN 1.4 (L) 12/31/2019 0530    Studies/Results: CT CHEST WO CONTRAST  Result Date: 01/10/2020 CLINICAL DATA:  Shortness of  breath, pleural effusion, abnormal chest x-ray EXAM: CT CHEST WITHOUT CONTRAST TECHNIQUE: Multidetector CT imaging of the chest was performed following the standard protocol without IV contrast. COMPARISON:  01/10/2020 FINDINGS: Cardiovascular: Unenhanced imaging of the heart demonstrates no pericardial effusion. Normal caliber of the thoracic aorta. No significant atherosclerosis. Left-sided PICC extends to the atrial caval junction. Mediastinum/Nodes: No enlarged mediastinal or axillary lymph nodes. Thyroid gland and esophagus are grossly unremarkable. There is circumferential narrowing of the subglottic airway, which could reflect mucosal edema or stenosis related previous intubation. Lungs/Pleura: Dependent atelectasis is identified within the lower lobes, left greater than right. There  are trace bilateral pleural effusions, left greater than right. No acute airspace disease or pneumothorax. The described abnormality at the right hilum on prior chest x-ray likely reflects confluence of atelectasis and vascular shadows. Upper Abdomen: There is extensive body wall edema within the bilateral flanks. No acute intra-abdominal process. Musculoskeletal: No acute or destructive bony lesions. Reconstructed images demonstrate no additional findings. IMPRESSION: 1. Trace bilateral pleural effusions, left greater than right, with bilateral dependent lower lobe atelectasis. 2. Circumferential narrowing of the subglottic airway, which could reflect mucosal edema or stenosis related to previous intubation. Electronically Signed   By: Randa Ngo M.D.   On: 01/10/2020 19:25   DG CHEST PORT 1 VIEW  Result Date: 01/10/2020 CLINICAL DATA:  Tachypnea. EXAM: PORTABLE CHEST 1 VIEW COMPARISON:  01/07/2020 FINDINGS: Left-sided PICC line has tip at the cavoatrial junction. Lungs are hypoinflated with mild hazy opacification over the left base/retrocardiac region which may be slightly worse and due to small layering effusions/atelectasis. Right perihilar opacification with possible cavitation unchanged. Cardiomediastinal silhouette and remainder of the exam is unchanged. IMPRESSION: 1. Mild hazy opacification over the left base/retrocardiac region which may represent layering small effusion/atelectasis. Stable opacification of the right hilar region with possible cavitation. Chest CT would be helpful for better evaluation. 2.  Left-sided PICC line with tip at the cavoatrial junction. Electronically Signed   By: Marin Olp M.D.   On: 01/10/2020 10:36   DG Swallowing Func-Speech Pathology  Result Date: 01/09/2020 Objective Swallowing Evaluation: Type of Study: MBS-Modified Barium Swallow Study  Patient Details Name: Alyssa Carey MRN: [id number] Date of Birth: Jan 02, 1971 Today's Date: 01/09/2020 Time: SLP Start  Time (ACUTE ONLY): 1230 -SLP Stop Time (ACUTE ONLY): 1246 SLP Time Calculation (min) (ACUTE ONLY): 16 min Past Medical History: No past medical history on file. Past Surgical History: Past Surgical History: Procedure Laterality Date . INCISION AND DRAINAGE PERIRECTAL ABSCESS N/A 12/25/2019  Procedure: IRRIGATION AND DEBRIDEMENT BUTTOCKS, LAP LOOP COLOSTOMY;  Surgeon: Greer Pickerel, MD;  Location: [facility];  Service: General;  Laterality: N/A; . IRRIGATION AND DEBRIDEMENT ABSCESS N/A 12/23/2019  Procedure: EXCISION AND DEBRIDEMENT LEFT BUTTOCK AND PERINEUM;  Surgeon: Clovis Riley, MD;  Location: [facility];  Service: General;  Laterality: N/A; . LAPAROSCOPIC LOOP COLOSTOMY N/A 12/25/2019  Procedure: LAPAROSCOPIC LOOP COLOSTOMY;  Surgeon: Greer Pickerel, MD;  Location: [facility];  Service: General;  Laterality: N/A; HPI: 49 year old with alcohol abuse, alcohol withdrawal, necrotizing fasciitis of left buttock status post debridement. Acute respiratory failure secondary to septic shock. Intubated from 6/8 to 6/23.  No data recorded Assessment / Plan / Recommendation CHL IP CLINICAL IMPRESSIONS 01/09/2020 Clinical Impression Pt demonstrates moderate dysphagia with decreased airway protectiona nd decreased sensation. Pt has mild oral deficits including lingual pumping of pureed solids and struggle to initaite transit of all boluses (pt refused graham). Pt fearfully releases the head of the boluses which spills  to pyriforms to trigger more reflexive timely swallow response with piecemealed bolus. Pt had 100% tolerance of honey thick liquids and puree, but there were also episodes of mild backflow from the cervical/upper throacic esophagus to the pyriforms with thicker texture. There was one instance of silent trace aspiration of a large nectar bolus and one instance of silent aspiration of initial volitional spillage of thin liquids. Pt senses slight oral residual and clears with a final swallow. Pt is recommend to initiate a puree  diet with nectar thick liquids if they can be given with small single sips (via spoon cup or straw). If any concern arises, downgrade to thicker honey texture. SLP Visit Diagnosis Dysphagia, oropharyngeal phase (R13.12) Attention and concentration deficit following -- Frontal lobe and executive function deficit following -- Impact on safety and function Moderate aspiration risk   CH[facility] IP TREATMENT RECOMMENDATION 01/09/2020 Treatment Recommendations Therapy as outlined in treatment plan below   Prognosis 01/09/2020 Prognosis for Safe Diet Advancement Fair Barriers to Reach Goals Severity of deficits Barriers/Prognosis Comment -- [facility] IP DIET RECOMMENDATION 01/09/2020 SLP Diet Recommendations Dysphagia 1 (Puree) solids;Nectar thick liquid Liquid Administration via Cup;Straw Medication Administration Crushed with puree Compensations Slow rate;Small sips/bites;Minimize environmental distractions Postural Changes Seated upright at 90 degrees   CH[facility] IP OTHER RECOMMENDATIONS 01/09/2020 Recommended Consults -- Oral Care Recommendations Oral care BID Other Recommendations Order thickener from pharmacy   [facility] IP FOLLOW UP RECOMMENDATIONS 01/09/2020 Follow up Recommendations Skilled Nursing facility   [facility] IP FREQUENCY AND DURATION 01/09/2020 Speech Therapy Frequency (ACUTE ONLY) min 2x/week Treatment Duration 2 weeks      CHL IP ORAL PHASE 01/09/2020 Oral Phase Impaired Oral - Pudding Teaspoon -- Oral - Pudding Cup -- Oral - Honey Teaspoon -- Oral - Honey Cup Lingual pumping;Holding of bolus;Reduced posterior propulsion;Piecemeal swallowing;Lingual/palatal residue;Delayed oral transit;Decreased bolus cohesion Oral - Nectar Teaspoon Lingual pumping;Holding of bolus;Reduced posterior propulsion;Piecemeal swallowing;Lingual/palatal residue;Delayed oral transit;Decreased bolus cohesion Oral - Nectar Cup Lingual pumping;Holding of bolus;Reduced posterior propulsion;Piecemeal swallowing;Lingual/palatal residue;Delayed oral transit;Decreased  bolus cohesion Oral - Nectar Straw Lingual pumping;Holding of bolus;Reduced posterior propulsion;Piecemeal swallowing;Lingual/palatal residue;Delayed oral transit;Decreased bolus cohesion Oral - Thin Teaspoon -- Oral - Thin Cup Lingual pumping;Holding of bolus;Reduced posterior propulsion;Piecemeal swallowing;Lingual/palatal residue;Delayed oral transit;Decreased bolus cohesion Oral - Thin Straw Lingual pumping;Holding of bolus;Reduced posterior propulsion;Piecemeal swallowing;Lingual/palatal residue;Delayed oral transit;Decreased bolus cohesion Oral - Puree Lingual pumping;Holding of bolus;Reduced posterior propulsion;Piecemeal swallowing;Lingual/palatal residue;Delayed oral transit;Decreased bolus cohesion Oral - Mech Soft -- Oral - Regular NT Oral - Multi-Consistency -- Oral - Pill -- Oral Phase - Comment --  CH[facility] IP PHARYNGEAL PHASE 01/09/2020 Pharyngeal Phase Impaired Pharyngeal- Pudding Teaspoon -- Pharyngeal -- Pharyngeal- Pudding Cup -- Pharyngeal -- Pharyngeal- Honey Teaspoon -- Pharyngeal -- Pharyngeal- Honey Cup Delayed swallow initiation-vallecula Pharyngeal -- Pharyngeal- Nectar Teaspoon Delayed swallow initiation-vallecula;Delayed swallow initiation-pyriform sinuses Pharyngeal -- Pharyngeal- Nectar Cup Delayed swallow initiation-pyriform sinuses;Delayed swallow initiation-vallecula Pharyngeal -- Pharyngeal- Nectar Straw Delayed swallow initiation-pyriform sinuses;Penetration/Aspiration during swallow Pharyngeal Material enters airway, passes BELOW cords without attempt by patient to eject out (silent aspiration) Pharyngeal- Thin Teaspoon -- Pharyngeal -- Pharyngeal- Thin Cup Delayed swallow initiation-pyriform sinuses Pharyngeal -- Pharyngeal- Thin Straw Delayed swallow initiation-pyriform sinuses;Penetration/Aspiration before swallow;Trace aspiration Pharyngeal Material enters airway, passes BELOW cords without attempt by patient to eject out (silent aspiration) Pharyngeal- Puree -- Pharyngeal --  Pharyngeal- Mechanical Soft -- Pharyngeal -- Pharyngeal- Regular -- Pharyngeal -- Pharyngeal- Multi-consistency -- Pharyngeal -- Pharyngeal- Pill -- Pharyngeal -- Pharyngeal Comment --  No flowsheet data found. Herbie Baltimore, MA CCC-SLP Acute  Rehabilitation Services Pager [phone] Office [phone] Lynann Beaver 01/09/2020, 1:47 PM                 Armandina Gemma 01/11/2020  Patient ID: Alyssa Carey, female   DOB: Mar 23, 1971, 49 y.o.   MRN: [id number]

## 2020-01-11 NOTE — Progress Notes (Signed)
PROGRESS NOTE    Alyssa Carey  IWP:[id number] DOB: 12-11-1970 DOA: 12/21/2019 PCP: Patient, No Pcp Per   Brief Narrative: Alyssa Carey is a 49 y.o. female that presented secondary to concern for alcohol withdrawal and quickly developed evidence of necrotizing fasciitis.  This was emergently managed in the OR by general surgery via I&D. Patient required intubation, vasopressor support and ICU admission for septic shock. She underwent repeat I&D and diverting colostomy. She has now been extubate and is off vasopressors. Hydrotherapy started.    ICU significant events: 6/06 admission 6/08 ICU admission to OR ofr I&D, back intubated and on pressors, 6/09 started tube feeds. Still pressor dependent, kept on vent w/ anticipated return to OR planned  6/10 Back to the OR for significant debridement and also placement of diverting colostomy 6/11 Sedated, low-dose phenylephrine. CCS felt wound looking good.  Sedation changed to precedex / PRN fent  6/12 more awake, on 15/5 pressure support 6/13 family discussion >> DNR, continue medical care 6//15 transfuse 1 unit PRBC; vomiting, tube feeds held 6/16 Vomiting overnight 6/17 Trial PSV 15/5 with variable Vt, remains on vasopressors.  Vomiting overnight  6/18 transfuse 1 unit PRBC 6/20 Off pressors,  On precedex and weaning on PSV  6/21 Weaning on PSV, planned hydrotherapy 6/23 Extubated 6/24 Transferred off of ICU service   Assessment & Plan:   Principal Problem:   Necrotizing fasciitis of pelvic region and thigh [facility]) Active Problems:   Fall at home, initial encounter   Weakness   Alcoholism ([place])   Thrombocytopenia ([place])   ARF (acute renal failure) ([place])   Hepatic encephalopathy ([place])   Alcoholic liver disease ([place])   Pressure injury of skin   Elevated LFTs   Acute respiratory failure with hypoxemia (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  General surgery was consulted and patient was emergently sent to the OR for incision and debridement.  Antibiotics as mentioned below.  Patient underwent repeat incision and drainage in addition to diverting loop colostomy to aid in healing of the wound.  Patient has now started hydrotherapy. Bleeding of wound overnight which has resolved.  Septic shock Secondary to necrotizing fasciitis.  Patient required ICU admission, intubation, vasopressor support.  Patient was initially managed on vancomycin, clindamycin, ceftriaxone.  Vancomycin was eventually transitioned to linezolid and clindamycin transition to Flagyl.  Patient was treated with antibiotics from 6/7 until 6/22. Culture data from wound significant for multiple organisms: E. coli, vancomycin resistant Enterococcus  Subglottic airway narrowing Circumferential. Seen incidentally on CT scan. Discussed with ENT, Dr. Redmond Baseman, who recommended outpatient follow-up since patient is without stridor. If clinical picture worsens, however, recommendations for inpatient evaluation.  Acute respiratory failure with hypoxia Patient required mechanical ventilation from 6/8 until 6/23.  Patient now requiring 2 L via nasal cannula. Still with some associated tachypnea. -Continue to wean to room air as able  Hypernatremia Secondary to poor free water intake.  Urine sodium osmolality obtain which was significant for an appropriately high urine osmolality of 706. Resolved initially and now worsened overnight. -Continue D5 IV fluids (100 ml/hr) -Serial BMP  Malnutrition Not present on admission. Secondary to critical illness.  Patient was initially on tube feeds while intubated.  OG tube was removed.  Speech therapy has evaluated at bedside in addition to modified barium swallow and recommends a dysphagia 1 diet. Albumin of  1.4 -Dysphagia 1 diet; will need to monitor  intake carefully. May still require NG tube and tube feeds. Discussed with patient.  Anemia of critical illness Acute blood loss anemia Baseline hemoglobin of 12-13.  Patient developed anemia in setting of severe infection/wound in addition to multiple surgical interventions.  Patient has received 3 units of PRBC to date.  Hemoglobin currently stable.  Bleeding from wound overnight (6/26). Acute drop of hemoglobin to 6.9. Improvement of hemoglobin to 7.1 post transfusion and stable. Initially thought to have wound hemorrhage, however it appears patient is having rectal hemorrhage. Hemoglobin stable. Subcutaneous heparin held on 6/26 -General surgery: rectal bleeding, GI consult recommended -Daily CBC -Blood transfusion as needed  Thrombocytopenia Present early in admission.  In setting of critical illness in addition to alcohol use and liver disease.  Patient received 2 units of platelets.  Currently resolved.  Hypokalemia Recurrent issue. Given oral supplementation this morning -Potassium IV x40 meq -Add potassium to IV fluids x 24 hours; recheck in potassium in AM  Hyperglycemia Hemoglobin A1c of 5.7.  Patient meets criteria for prediabetes.  Recommend diet modification for treatment.  Could consider metformin as well as an outpatient.  With patient's current weight, high likelihood weight loss could prevent the need for any medication management.   Alcoholic hepatitis Hepatic encephalopathy Present on admission.  Now resolved.  Was previously on lactulose which has been held in light of current buttock wound.  AKI Patient with unknown baseline.  Patient with a creatinine of 1.74 on admission.  AKI currently resolved.  Morbid obesity Body mass index is 43.18 kg/m.  Pressure injury Deep tissue injury of right ear, not present on admission   DVT prophylaxis: SCDs Code Status:   Code Status: DNR Family Communication: None at bedside. Disposition Plan: Anticipate patient will  likely discharge to LTAC.   Patient will be receiving continued hydrotherapy per PT.  Pending management of GI bleeding   Consultants:   PCCM  General surgery  Gastroenterology ([place])  Procedures:   ENDOTRACHEAL INTUBATION (6/8 >> 01/07/2020)  INCISION AND DEBRIDEMENT OF LEFT BUTTOCK AND PERINEUM (12/23/2019)  IRRIGATION AND DEBRIDEMENT OF BUTTOCKS; LAPAROSCOPIC LOOP COLOSTOMY (12/25/2019)    Antimicrobials:  Ceftriaxone  Vancomycin  Clindamycin  Linezolid  Flagyl   Subjective: No dyspnea, chest pain, pain in general.  Objective: Vitals:   01/11/20 0412 01/11/20 0433 01/11/20 0500 01/11/20 0700  BP: 134/80  127/80 (!) 126/103  Pulse: (!) 119  (!) 123 (!) 125  Resp: (!) 37  (!) 38 (!) 31  Temp:      TempSrc:      SpO2: 99%  100% 100%  Weight:  114.1 kg    Height:        Intake/Output Summary (Last 24 hours) at 01/11/2020 0756 Last data filed at 01/11/2020 0700 Gross per 24 hour  Intake 2827.8 ml  Output 1625 ml  Net 1202.8 ml   Filed Weights   01/09/20 2007 01/10/20 0500 01/11/20 0433  Weight: 115.1 kg 115.6 kg 114.1 kg    Examination:  General exam: Appears calm and comfortable Respiratory system: Clear to auscultation. Tachypnea Cardiovascular system: S1 & S2 heard, RRR. No murmurs, rubs, gallops or clicks. Gastrointestinal system: Abdomen is nondistended, soft and nontender. No organomegaly or masses felt. Normal bowel sounds heard. Ostomy bag with liquid stool Central nervous system: Alert and oriented. Musculoskeletal: No edema. No calf tenderness Skin: No cyanosis. No rashes Psychiatry: Judgement and insight appear normal. Flat affect     Data Reviewed:  I have personally reviewed following labs and imaging studies  CBC Lab Results  Component Value Date   WBC 14.7 (H) 01/10/2020   RBC 2.11 (L) 01/10/2020   HGB 7.1 (L) 01/10/2020   HCT 23.4 (L) 01/10/2020   MCV 110.9 (H) 01/10/2020   MCH 32.7 01/10/2020   PLT 498 (H) 01/10/2020    MCHC 29.5 (L) 01/10/2020   RDW 24.1 (H) 01/10/2020   LYMPHSABS 3.4 12/28/2019   MONOABS 0.8 12/28/2019   EOSABS 0.2 12/28/2019   BASOSABS 0.1 09/62/8366     Last metabolic panel Lab Results  Component Value Date   NA 147 (H) 01/11/2020   K 2.7 (LL) 01/11/2020   CL 115 (H) 01/11/2020   CO2 27 01/11/2020   BUN 10 01/11/2020   CREATININE <0.30 (L) 01/11/2020   GLUCOSE 109 (H) 01/11/2020   GFRNONAA NOT CALCULATED 01/11/2020   GFRAA NOT CALCULATED 01/11/2020   CALCIUM 7.8 (L) 01/11/2020   PHOS 3.3 12/30/2019   PROT 5.9 (L) 01/02/2020   ALBUMIN 1.4 (L) 01/02/2020   BILITOT 1.0 01/02/2020   ALKPHOS 117 01/02/2020   AST 39 01/02/2020   ALT 21 01/02/2020   ANIONGAP 5 01/11/2020    CBG (last 3)  Recent Labs    01/10/20 1921 01/11/20 0007 01/11/20 0414  GLUCAP 188* 117* 89     GFR: CrCl cannot be calculated (This lab value cannot be used to calculate CrCl because it is not a number: <0.30).  Coagulation Profile: Recent Labs  Lab 01/11/20 0343  INR 1.2    No results found for this or any previous visit (from the past 240 hour(s)).      Radiology Studies: CT CHEST WO CONTRAST  Result Date: 01/10/2020 CLINICAL DATA:  Shortness of breath, pleural effusion, abnormal chest x-ray EXAM: CT CHEST WITHOUT CONTRAST TECHNIQUE: Multidetector CT imaging of the chest was performed following the standard protocol without IV contrast. COMPARISON:  01/10/2020 FINDINGS: Cardiovascular: Unenhanced imaging of the heart demonstrates no pericardial effusion. Normal caliber of the thoracic aorta. No significant atherosclerosis. Left-sided PICC extends to the atrial caval junction. Mediastinum/Nodes: No enlarged mediastinal or axillary lymph nodes. Thyroid gland and esophagus are grossly unremarkable. There is circumferential narrowing of the subglottic airway, which could reflect mucosal edema or stenosis related previous intubation. Lungs/Pleura: Dependent atelectasis is identified within  the lower lobes, left greater than right. There are trace bilateral pleural effusions, left greater than right. No acute airspace disease or pneumothorax. The described abnormality at the right hilum on prior chest x-ray likely reflects confluence of atelectasis and vascular shadows. Upper Abdomen: There is extensive body wall edema within the bilateral flanks. No acute intra-abdominal process. Musculoskeletal: No acute or destructive bony lesions. Reconstructed images demonstrate no additional findings. IMPRESSION: 1. Trace bilateral pleural effusions, left greater than right, with bilateral dependent lower lobe atelectasis. 2. Circumferential narrowing of the subglottic airway, which could reflect mucosal edema or stenosis related to previous intubation. Electronically Signed   By: Randa Ngo M.D.   On: 01/10/2020 19:25   DG CHEST PORT 1 VIEW  Result Date: 01/10/2020 CLINICAL DATA:  Tachypnea. EXAM: PORTABLE CHEST 1 VIEW COMPARISON:  01/07/2020 FINDINGS: Left-sided PICC line has tip at the cavoatrial junction. Lungs are hypoinflated with mild hazy opacification over the left base/retrocardiac region which may be slightly worse and due to small layering effusions/atelectasis. Right perihilar opacification with possible cavitation unchanged. Cardiomediastinal silhouette and remainder of the exam is unchanged. IMPRESSION: 1. Mild hazy opacification over the left base/retrocardiac region  which may represent layering small effusion/atelectasis. Stable opacification of the right hilar region with possible cavitation. Chest CT would be helpful for better evaluation. 2.  Left-sided PICC line with tip at the cavoatrial junction. Electronically Signed   By: Marin Olp M.D.   On: 01/10/2020 10:36   DG Swallowing Func-Speech Pathology  Result Date: 01/09/2020 Objective Swallowing Evaluation: Type of Study: MBS-Modified Barium Swallow Study  Patient Details Name: LEAHANN LEMPKE MRN: [id number] Date of Birth:  1970/12/06 Today's Date: 01/09/2020 Time: SLP Start Time (ACUTE ONLY): 1230 -SLP Stop Time (ACUTE ONLY): 1246 SLP Time Calculation (min) (ACUTE ONLY): 16 min Past Medical History: No past medical history on file. Past Surgical History: Past Surgical History: Procedure Laterality Date . INCISION AND DRAINAGE PERIRECTAL ABSCESS N/A 12/25/2019  Procedure: IRRIGATION AND DEBRIDEMENT BUTTOCKS, LAP LOOP COLOSTOMY;  Surgeon: Greer Pickerel, MD;  Location: [facility];  Service: General;  Laterality: N/A; . IRRIGATION AND DEBRIDEMENT ABSCESS N/A 12/23/2019  Procedure: EXCISION AND DEBRIDEMENT LEFT BUTTOCK AND PERINEUM;  Surgeon: Clovis Riley, MD;  Location: [facility];  Service: General;  Laterality: N/A; . LAPAROSCOPIC LOOP COLOSTOMY N/A 12/25/2019  Procedure: LAPAROSCOPIC LOOP COLOSTOMY;  Surgeon: Greer Pickerel, MD;  Location: [facility];  Service: General;  Laterality: N/A; HPI: 49 year old with alcohol abuse, alcohol withdrawal, necrotizing fasciitis of left buttock status post debridement. Acute respiratory failure secondary to septic shock. Intubated from 6/8 to 6/23.  No data recorded Assessment / Plan / Recommendation CHL IP CLINICAL IMPRESSIONS 01/09/2020 Clinical Impression Pt demonstrates moderate dysphagia with decreased airway protectiona nd decreased sensation. Pt has mild oral deficits including lingual pumping of pureed solids and struggle to initaite transit of all boluses (pt refused graham). Pt fearfully releases the head of the boluses which spills to pyriforms to trigger more reflexive timely swallow response with piecemealed bolus. Pt had 100% tolerance of honey thick liquids and puree, but there were also episodes of mild backflow from the cervical/upper throacic esophagus to the pyriforms with thicker texture. There was one instance of silent trace aspiration of a large nectar bolus and one instance of silent aspiration of initial volitional spillage of thin liquids. Pt senses slight oral residual and clears with a  final swallow. Pt is recommend to initiate a puree diet with nectar thick liquids if they can be given with small single sips (via spoon cup or straw). If any concern arises, downgrade to thicker honey texture. SLP Visit Diagnosis Dysphagia, oropharyngeal phase (R13.12) Attention and concentration deficit following -- Frontal lobe and executive function deficit following -- Impact on safety and function Moderate aspiration risk   CHL IP TREATMENT RECOMMENDATION 01/09/2020 Treatment Recommendations Therapy as outlined in treatment plan below   Prognosis 01/09/2020 Prognosis for Safe Diet Advancement Fair Barriers to Reach Goals Severity of deficits Barriers/Prognosis Comment -- CHL IP DIET RECOMMENDATION 01/09/2020 SLP Diet Recommendations Dysphagia 1 (Puree) solids;Nectar thick liquid Liquid Administration via Cup;Straw Medication Administration Crushed with puree Compensations Slow rate;Small sips/bites;Minimize environmental distractions Postural Changes Seated upright at 90 degrees   CHL IP OTHER RECOMMENDATIONS 01/09/2020 Recommended Consults -- Oral Care Recommendations Oral care BID Other Recommendations Order thickener from pharmacy   CHL IP FOLLOW UP RECOMMENDATIONS 01/09/2020 Follow up Recommendations Skilled Nursing facility   [facility] IP FREQUENCY AND DURATION 01/09/2020 Speech Therapy Frequency (ACUTE ONLY) min 2x/week Treatment Duration 2 weeks      CHL IP ORAL PHASE 01/09/2020 Oral Phase Impaired Oral - Pudding Teaspoon -- Oral - Pudding Cup -- Oral - Honey Teaspoon -- Oral -  Honey Cup Lingual pumping;Holding of bolus;Reduced posterior propulsion;Piecemeal swallowing;Lingual/palatal residue;Delayed oral transit;Decreased bolus cohesion Oral - Nectar Teaspoon Lingual pumping;Holding of bolus;Reduced posterior propulsion;Piecemeal swallowing;Lingual/palatal residue;Delayed oral transit;Decreased bolus cohesion Oral - Nectar Cup Lingual pumping;Holding of bolus;Reduced posterior propulsion;Piecemeal  swallowing;Lingual/palatal residue;Delayed oral transit;Decreased bolus cohesion Oral - Nectar Straw Lingual pumping;Holding of bolus;Reduced posterior propulsion;Piecemeal swallowing;Lingual/palatal residue;Delayed oral transit;Decreased bolus cohesion Oral - Thin Teaspoon -- Oral - Thin Cup Lingual pumping;Holding of bolus;Reduced posterior propulsion;Piecemeal swallowing;Lingual/palatal residue;Delayed oral transit;Decreased bolus cohesion Oral - Thin Straw Lingual pumping;Holding of bolus;Reduced posterior propulsion;Piecemeal swallowing;Lingual/palatal residue;Delayed oral transit;Decreased bolus cohesion Oral - Puree Lingual pumping;Holding of bolus;Reduced posterior propulsion;Piecemeal swallowing;Lingual/palatal residue;Delayed oral transit;Decreased bolus cohesion Oral - Mech Soft -- Oral - Regular NT Oral - Multi-Consistency -- Oral - Pill -- Oral Phase - Comment --  CHL IP PHARYNGEAL PHASE 01/09/2020 Pharyngeal Phase Impaired Pharyngeal- Pudding Teaspoon -- Pharyngeal -- Pharyngeal- Pudding Cup -- Pharyngeal -- Pharyngeal- Honey Teaspoon -- Pharyngeal -- Pharyngeal- Honey Cup Delayed swallow initiation-vallecula Pharyngeal -- Pharyngeal- Nectar Teaspoon Delayed swallow initiation-vallecula;Delayed swallow initiation-pyriform sinuses Pharyngeal -- Pharyngeal- Nectar Cup Delayed swallow initiation-pyriform sinuses;Delayed swallow initiation-vallecula Pharyngeal -- Pharyngeal- Nectar Straw Delayed swallow initiation-pyriform sinuses;Penetration/Aspiration during swallow Pharyngeal Material enters airway, passes BELOW cords without attempt by patient to eject out (silent aspiration) Pharyngeal- Thin Teaspoon -- Pharyngeal -- Pharyngeal- Thin Cup Delayed swallow initiation-pyriform sinuses Pharyngeal -- Pharyngeal- Thin Straw Delayed swallow initiation-pyriform sinuses;Penetration/Aspiration before swallow;Trace aspiration Pharyngeal Material enters airway, passes BELOW cords without attempt by patient to eject  out (silent aspiration) Pharyngeal- Puree -- Pharyngeal -- Pharyngeal- Mechanical Soft -- Pharyngeal -- Pharyngeal- Regular -- Pharyngeal -- Pharyngeal- Multi-consistency -- Pharyngeal -- Pharyngeal- Pill -- Pharyngeal -- Pharyngeal Comment --  No flowsheet data found. Herbie [place], MA CCC-SLP Acute Rehabilitation Services Pager [phone] Office [phone] Lynann Beaver 01/09/2020, 1:47 PM                   Scheduled Meds: . vitamin C  500 mg Oral BID  . chlorhexidine  15 mL Mouth Rinse BID  . Chlorhexidine Gluconate Cloth  6 each Topical Daily  . ferrous sulfate  325 mg Oral BID WC  . folic acid  1 mg Oral Daily  . insulin aspart  0-6 Units Subcutaneous Q4H  . mouth rinse  15 mL Mouth Rinse q12n4p  . multivitamin with minerals  1 tablet Oral Daily  . pantoprazole  40 mg Oral BID  . potassium chloride  40 mEq Oral BID  . sodium chloride flush  10-40 mL Intracatheter Q12H  . thiamine  100 mg Oral Daily  . vitamin A  20,000 Units Oral Daily  . Vitamin D (Ergocalciferol)  50,000 Units Oral Q7 days  . zinc sulfate  220 mg Oral Daily   Continuous Infusions: . sodium chloride    . dextrose 75 mL/hr at 01/11/20 0700     LOS: 21 days     Cordelia Poche, MD [facility] 01/11/2020, 7:56 AM  If 7PM-7AM, please contact night-coverage [email or web address]

## 2020-01-11 NOTE — Progress Notes (Signed)
Ms. Alyssa Carey was awake and alert when I arrived. She talked, just barely above a whisper and it was difficult to hear and understand. She wrote down her question for me. She wanted prayer and was grateful for the visit and support of pastoral care. Please page if additional support is needed. [place], [place]   01/11/20 1800  Clinical Encounter Type  Visited With Patient

## 2020-01-11 NOTE — Op Note (Signed)
[facility] Patient Name: Alyssa Carey Procedure Date: 01/11/2020 MRN: [id number] Attending MD: Carol Ada , MD Date of Birth: [birth year] CSN: [id number] Age: 49 Admit Type: Inpatient Procedure:                Flexible Sigmoidoscopy Indications:              Hematochezia Providers:                Carol Ada, MD, Vista Lawman, RN, Tyrone Apple, Technician, Laverda Sorenson, Technician Referring MD:              Medicines:                Minimal sedation per ICU nurse. Complications:            No immediate complications. Estimated Blood Loss:     Estimated blood loss: none. Procedure:                Pre-Anesthesia Assessment:                           - Prior to the procedure, a History and Physical                            was performed, and patient medications and                            allergies were reviewed. The patient's tolerance of                            previous anesthesia was also reviewed. The risks                            and benefits of the procedure and the sedation                            options and risks were discussed with the patient.                            All questions were answered, and informed consent                            was obtained. Prior Anticoagulants: The patient has                            taken no previous anticoagulant or antiplatelet                            agents. ASA Grade Assessment: IV - A patient with                            severe systemic disease that is a constant threat  to life. After reviewing the risks and benefits,                            the patient was deemed in satisfactory condition to                            undergo the procedure.                           - The sedation level attained was minimal.                           After obtaining informed consent, the scope was                            passed under direct  vision. The [id number]-[id number] ([id number])                            Olympus gastroscope was introduced through the anus                            and advanced to the the descending colon. The                            flexible sigmoidoscopy was accomplished without                            difficulty. The patient tolerated the procedure                            well. The quality of the bowel preparation was                            excellent. Scope In: Scope Out: Findings:      A single (solitary) ten mm ulcer was found in the rectum. No bleeding       was present. Stigmata of recent bleeding were present.      In the right lateral portion of the rectum, just above the dentate line,       a 1 cm shallow cratered ulcer was identified. The edges of the ulcer       were friable and there was no evidence of a visible vessel. Blood was       only noted in the rectum and proximally the liquid stool was normal in       color. The mucosa was healthy appearing, overall. There was the       possibility of a visible vessel in dentate line area of the anus, but       manipulation of this site did not incite any bleeding. It was determined       that it was part of the anal mucosa. Impression:               - A single (solitary) ulcer in the rectum.                           -  No specimens collected. Moderate Sedation:      Not Applicable - Patient had care per Anesthesia. Recommendation:           - Continue with the current supportive care.                           - No specific recommendations given the patient's                            overall condition and findings. There was no                            evidence of any solid stool that is the typical                            source to induce a stercoral ulcer. Procedure Code(s):        --- Professional ---                           [phone], Sigmoidoscopy, flexible; diagnostic,                            including collection of specimen(s)  by brushing or                            washing, when performed (separate procedure) Diagnosis Code(s):        --- Professional ---                           K62.6, Ulcer of anus and rectum                           K92.1, Melena (includes Hematochezia) CPT copyright 2019 [employer]. All rights reserved. The codes documented in this report are preliminary and upon coder review may  be revised to meet current compliance requirements. Carol Ada, MD Carol Ada, MD 01/11/2020 4:44:03 PM This report has been signed electronically. Number of Addenda: 0

## 2020-01-11 NOTE — Progress Notes (Addendum)
Progress Note for [facility]  Subjective: See below.  Objective: Vital signs in last 24 hours: Temp:  [98.1 F (36.7 C)-99.1 F (37.3 C)] 99.1 F (37.3 C) (06/27 1254) Pulse Rate:  [95-126] 121 (06/27 1400) Resp:  [17-38] 35 (06/27 1400) BP: (103-147)/(47-106) 123/83 (06/27 1400) SpO2:  [95 %-100 %] 100 % (06/27 1400) Weight:  [114.1 kg] 114.1 kg (06/27 0433) Last BM Date: 01/11/20  Intake/Output from previous day: 06/26 0701 - 06/27 0700 In: 2827.8 [P.O.:160; I.V.:1692.2; Blood:300; IV Piggyback:675.6] Out: 1625 [Urine:450; Stool:950] Intake/Output this shift: Total I/O In: 797.6 [I.V.:597.7; IV Piggyback:199.9] Out: -   General appearance: awake, very weak GI: descending loop colostomy Rectal:  Hematochezia  Lab Results: Recent Labs    01/09/20 1952 01/09/20 1952 01/10/20 0500 01/10/20 1730 01/11/20 0924  WBC 13.7*  --  14.7*  --  14.5*  HGB 8.7*   < > 6.9* 7.1* 7.6*  HCT 28.7*   < > 23.4* 23.4* 25.1*  PLT 517*  --  498*  --  488*   < > = values in this interval not displayed.   BMET Recent Labs    01/10/20 0500 01/10/20 1730 01/11/20 0343  NA 152* 145 147*  K 2.9* 3.7 2.7*  CL 120* 113* 115*  CO2 30 27 27   GLUCOSE 134* 287* 109*  BUN 12 10 10   CREATININE <0.30* <0.30* <0.30*  CALCIUM 8.1* 7.5* 7.8*   LFT No results for input(s): PROT, ALBUMIN, AST, ALT, ALKPHOS, BILITOT, BILIDIR, IBILI in the last 72 hours. PT/INR Recent Labs    01/11/20 0343  LABPROT 14.9  INR 1.2   Hepatitis Panel No results for input(s): HEPBSAG, HCVAB, HEPAIGM, HEPBIGM in the last 72 hours. C-Diff No results for input(s): CDIFFTOX in the last 72 hours. Fecal Lactopherrin No results for input(s): FECLLACTOFRN in the last 72 hours.  Studies/Results: CT CHEST WO CONTRAST  Result Date: 01/10/2020 CLINICAL DATA:  Shortness of breath, pleural effusion, abnormal chest x-ray EXAM: CT CHEST WITHOUT CONTRAST TECHNIQUE: Multidetector CT imaging of the chest was performed  following the standard protocol without IV contrast. COMPARISON:  01/10/2020 FINDINGS: Cardiovascular: Unenhanced imaging of the heart demonstrates no pericardial effusion. Normal caliber of the thoracic aorta. No significant atherosclerosis. Left-sided PICC extends to the atrial caval junction. Mediastinum/Nodes: No enlarged mediastinal or axillary lymph nodes. Thyroid gland and esophagus are grossly unremarkable. There is circumferential narrowing of the subglottic airway, which could reflect mucosal edema or stenosis related previous intubation. Lungs/Pleura: Dependent atelectasis is identified within the lower lobes, left greater than right. There are trace bilateral pleural effusions, left greater than right. No acute airspace disease or pneumothorax. The described abnormality at the right hilum on prior chest x-ray likely reflects confluence of atelectasis and vascular shadows. Upper Abdomen: There is extensive body wall edema within the bilateral flanks. No acute intra-abdominal process. Musculoskeletal: No acute or destructive bony lesions. Reconstructed images demonstrate no additional findings. IMPRESSION: 1. Trace bilateral pleural effusions, left greater than right, with bilateral dependent lower lobe atelectasis. 2. Circumferential narrowing of the subglottic airway, which could reflect mucosal edema or stenosis related to previous intubation. Electronically Signed   By: Randa Ngo M.Carey.   On: 01/10/2020 19:25   DG CHEST PORT 1 VIEW  Result Date: 01/10/2020 CLINICAL DATA:  Tachypnea. EXAM: PORTABLE CHEST 1 VIEW COMPARISON:  01/07/2020 FINDINGS: Left-sided PICC line has tip at the cavoatrial junction. Lungs are hypoinflated with mild hazy opacification over the left base/retrocardiac region which may be slightly worse and  due to small layering effusions/atelectasis. Right perihilar opacification with possible cavitation unchanged. Cardiomediastinal silhouette and remainder of the exam is  unchanged. IMPRESSION: 1. Mild hazy opacification over the left base/retrocardiac region which may represent layering small effusion/atelectasis. Stable opacification of the right hilar region with possible cavitation. Chest CT would be helpful for better evaluation. 2.  Left-sided PICC line with tip at the cavoatrial junction. Electronically Signed   By: Marin Olp M.Carey.   On: 01/10/2020 10:36    Medications:  Scheduled: . vitamin C  500 mg Oral BID  . chlorhexidine  15 mL Mouth Rinse BID  . Chlorhexidine Gluconate Cloth  6 each Topical Daily  . ferrous sulfate  325 mg Oral BID WC  . folic acid  1 mg Oral Daily  . insulin aspart  0-6 Units Subcutaneous Q4H  . mouth rinse  15 mL Mouth Rinse q12n4p  . multivitamin with minerals  1 tablet Oral Daily  . pantoprazole  40 mg Oral BID  . sodium chloride flush  10-40 mL Intracatheter Q12H  . thiamine  100 mg Oral Daily  . vitamin A  20,000 Units Oral Daily  . Vitamin Carey (Ergocalciferol)  50,000 Units Oral Q7 days  . zinc sulfate  220 mg Oral Daily   Continuous: . sodium chloride    . dextrose 5 % with kcl 100 mL/hr at 01/11/20 1400  . dextrose Stopped (01/11/20 1057)    Assessment/Plan: 1) Hematochezia. 2) Necrotizing fascitis of the left buttocks. 3) Anemia.   The patient was initially evaluated by Dr. Fuller Plan for ETOH hepatitis.  At that time it was also discovered that she had necrotizing fascitis and she required surgical debridement and a descending loop colostomy on 12/23/2019 and 12/25/2019.  She remained critically ill and her clinical progression remains very slow.  There is granulation tissue in 90% of her wound and she continues with packing and dressing changes.  Yesterday, surgery and nursing noted that she had blood coming out of her rectum.  There was also some streaks of blood in the ostomy bag.  Over the past 24 hours her bleeding appears to have subsided, but GI was requested for further evaluation of the bleeding.  The  examination with nursing assistance today does not show any blood in the ostomy bag.  There was thick liquid brown stool.  Turning the patient over did show the large left buttock wound.  Initial gross inspection of the rectum was positive for blood exuding from the anus.  The rectal examination was negative for any palpable ulcerations or distal rectal abnormalities.  The examination glove was coated with blood.  Her HGB is relatively stable at this time as well as her hemodynamics.  Her last dose of [place] heparin was yesterday.  Plan: 1) FFS tomorrow unless bleeding becomes markedly worse. 2) NPO. 3) Follow HGB and transfuse as necessary.   ADDENDUM: Nursing feels that her bleeding worsened when they were changing the dressing and cleaning her up.  Given the large than expected amount of blood on the examination glove and the reports of worsening bleeding, an emergent FFS will be performed.  I spoke with her son and described the procedure and its risks and goals.  Hopefully the procedure can be performed with not sedation or minimal sedation.  LOS: 21 days   Alyssa Carey 01/11/2020, 2:43 PM

## 2020-01-12 ENCOUNTER — Inpatient Hospital Stay (HOSPITAL_COMMUNITY): Payer: Medicaid Other

## 2020-01-12 ENCOUNTER — Encounter (HOSPITAL_COMMUNITY): Payer: Self-pay | Admitting: Gastroenterology

## 2020-01-12 DIAGNOSIS — R491 Aphonia: Secondary | ICD-10-CM

## 2020-01-12 DIAGNOSIS — E870 Hyperosmolality and hypernatremia: Secondary | ICD-10-CM

## 2020-01-12 DIAGNOSIS — K921 Melena: Secondary | ICD-10-CM

## 2020-01-12 DIAGNOSIS — J3801 Paralysis of vocal cords and larynx, unilateral: Secondary | ICD-10-CM

## 2020-01-12 DIAGNOSIS — R601 Generalized edema: Secondary | ICD-10-CM

## 2020-01-12 DIAGNOSIS — K626 Ulcer of anus and rectum: Secondary | ICD-10-CM

## 2020-01-12 DIAGNOSIS — Z933 Colostomy status: Secondary | ICD-10-CM

## 2020-01-12 LAB — BASIC METABOLIC PANEL
Anion gap: 3 — ABNORMAL LOW (ref 5–15)
Anion gap: 6 (ref 5–15)
BUN: 6 mg/dL (ref 6–20)
BUN: 5 mg/dL — ABNORMAL LOW (ref 6–20)
CO2: 26 mmol/L (ref 22–32)
CO2: 24 mmol/L (ref 22–32)
Calcium: 7.5 mg/dL — ABNORMAL LOW (ref 8.9–10.3)
Calcium: 7.6 mg/dL — ABNORMAL LOW (ref 8.9–10.3)
Chloride: 114 mmol/L — ABNORMAL HIGH (ref 98–111)
Chloride: 112 mmol/L — ABNORMAL HIGH (ref 98–111)
Creatinine, Ser: <0.3 mg/dL — ABNORMAL LOW (ref 0.44–1.00)
Creatinine, Ser: 0.33 mg/dL — ABNORMAL LOW (ref 0.44–1.00)
GFR calc Af Amer: >60 mL/min (ref >60)
GFR calc non Af Amer: >60 mL/min (ref >60)
Glucose, Bld: 100 mg/dL — ABNORMAL HIGH (ref 70–99)
Glucose, Bld: 113 mg/dL — ABNORMAL HIGH (ref 70–99)
Potassium: 3.2 mmol/L — ABNORMAL LOW (ref 3.5–5.1)
Potassium: 3.2 mmol/L — ABNORMAL LOW (ref 3.5–5.1)
Sodium: 143 mmol/L (ref 135–145)
Sodium: 142 mmol/L (ref 135–145)

## 2020-01-12 LAB — GLUCOSE, CAPILLARY
Glucose-Capillary: 84 mg/dL (ref 70–99)
Glucose-Capillary: 79 mg/dL (ref 70–99)
Glucose-Capillary: 98 mg/dL (ref 70–99)
Glucose-Capillary: 111 mg/dL — ABNORMAL HIGH (ref 70–99)
Glucose-Capillary: 104 mg/dL — ABNORMAL HIGH (ref 70–99)
Glucose-Capillary: 130 mg/dL — ABNORMAL HIGH (ref 70–99)

## 2020-01-12 LAB — CBC
HCT: 28 % — ABNORMAL LOW (ref 36.0–46.0)
Hemoglobin: 8.6 g/dL — ABNORMAL LOW (ref 12.0–15.0)
MCH: 31.5 pg (ref 26.0–34.0)
MCHC: 30.7 g/dL (ref 30.0–36.0)
MCV: 102.6 fL — ABNORMAL HIGH (ref 80.0–100.0)
Platelets: 474 10*3/uL — ABNORMAL HIGH (ref 150–400)
RBC: 2.73 MIL/uL — ABNORMAL LOW (ref 3.87–5.11)
RDW: 26.9 % — ABNORMAL HIGH (ref 11.5–15.5)
WBC: 11.6 10*3/uL — ABNORMAL HIGH (ref 4.0–10.5)
nRBC: 0 % (ref 0.0–0.2)

## 2020-01-12 LAB — PHOSPHORUS: Phosphorus: 3.9 mg/dL (ref 2.5–4.6)

## 2020-01-12 LAB — MAGNESIUM: Magnesium: 1.4 mg/dL — ABNORMAL LOW (ref 1.7–2.4)

## 2020-01-12 MED ORDER — POTASSIUM CHLORIDE 10 MEQ/100ML IV SOLN
10.0000 meq | INTRAVENOUS | Status: AC
  Administered 2020-01-12 – 2020-01-13 (×3): 10 meq via INTRAVENOUS
  Filled 2020-01-12 (×3): qty 100

## 2020-01-12 MED ORDER — SILVER NITRATE-POT NITRATE 75-25 % EX MISC
1.0000 | Freq: Once | CUTANEOUS | Status: DC | PRN
  Filled 2020-01-12: qty 10

## 2020-01-12 MED ORDER — POTASSIUM CHLORIDE 2 MEQ/ML IV SOLN
INTRAVENOUS | Status: DC
  Filled 2020-01-12 (×3): qty 1000

## 2020-01-12 MED ORDER — COLLAGENASE 250 UNIT/GM EX OINT
TOPICAL_OINTMENT | Freq: Every day | CUTANEOUS | Status: DC
  Administered 2020-01-26: 1 via TOPICAL
  Filled 2020-01-12 (×3): qty 30

## 2020-01-12 MED ORDER — MAGNESIUM SULFATE 2 GM/50ML IV SOLN
2.0000 g | Freq: Once | INTRAVENOUS | Status: AC
  Administered 2020-01-12: 2 g via INTRAVENOUS
  Filled 2020-01-12: qty 50

## 2020-01-12 MED ORDER — LIDOCAINE HCL 4 % EX SOLN
0.0000 mL | Freq: Once | CUTANEOUS | Status: DC | PRN
  Filled 2020-01-12: qty 50

## 2020-01-12 MED ORDER — LIDOCAINE HCL 2 % EX GEL
1.0000 "application " | Freq: Once | CUTANEOUS | Status: DC | PRN
  Filled 2020-01-12: qty 4250

## 2020-01-12 MED ORDER — TRIPLE ANTIBIOTIC 3.5-400-5000 EX OINT
1.0000 "application " | TOPICAL_OINTMENT | Freq: Once | CUTANEOUS | Status: DC | PRN
  Filled 2020-01-12: qty 1

## 2020-01-12 MED ORDER — LIDOCAINE-EPINEPHRINE (PF) 1 %-1:200000 IJ SOLN
0.0000 mL | Freq: Once | INTRAMUSCULAR | Status: DC | PRN
  Filled 2020-01-12: qty 30

## 2020-01-12 MED ORDER — OXYMETAZOLINE HCL 0.05 % NA SOLN
1.0000 | Freq: Once | NASAL | Status: DC | PRN
  Filled 2020-01-12 (×2): qty 15

## 2020-01-12 MED ORDER — FERROUS SULFATE 300 (60 FE) MG/5ML PO SYRP
300.0000 mg | ORAL_SOLUTION | Freq: Two times a day (BID) | ORAL | Status: DC
  Administered 2020-01-12 – 2020-01-27 (×29): 300 mg
  Filled 2020-01-12 (×33): qty 5

## 2020-01-12 MED ORDER — INSULIN ASPART 100 UNIT/ML ~~LOC~~ SOLN
4.0000 [IU] | SUBCUTANEOUS | Status: DC
  Administered 2020-01-13 – 2020-01-26 (×40): 4 [IU] via SUBCUTANEOUS

## 2020-01-12 MED ORDER — POTASSIUM CHLORIDE 2 MEQ/ML IV SOLN: INTRAVENOUS | Status: DC

## 2020-01-12 MED ORDER — PRO-STAT SUGAR FREE PO LIQD
30.0000 mL | Freq: Every day | ORAL | Status: DC
  Administered 2020-01-12 – 2020-01-27 (×16): 30 mL
  Filled 2020-01-12 (×16): qty 30

## 2020-01-12 MED ORDER — MESALAMINE 1000 MG RE SUPP: 1000.0000 mg | Freq: Every day | RECTAL | Status: DC

## 2020-01-12 MED ORDER — OSMOLITE 1.2 CAL PO LIQD: 1000.0000 mL | ORAL | Status: DC

## 2020-01-12 MED ORDER — PIVOT 1.5 CAL PO LIQD
1000.0000 mL | ORAL | Status: DC
  Administered 2020-01-12 – 2020-01-23 (×14): 1000 mL
  Filled 2020-01-12 (×16): qty 1000

## 2020-01-12 NOTE — Progress Notes (Signed)
  Speech Language Pathology Treatment: Dysphagia  Patient Details Name: Alyssa Carey MRN: [id number] DOB: [birth year] Today's Date: 01/12/2020 Time: 9528-4132 SLP Time Calculation (min) (ACUTE ONLY): 25 min  Assessment / Plan / Recommendation Clinical Impression  SLP follow up to assess po tolerance and for pt/family education.  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 Recurrent laryngeal nerve involvement.     Note per   Pt today reports incr stridor and states voice is weaker than over the weekend.  SLP uncertain to accuracy of information provided - given prolonged intubation - ? Cognition combined with pt's near aphonia.     She did cough and expecorate secretions with scant blood x3 during session - RR at rest was 31 and she has the feeding tube in the correct place per Xray.   Pt accepted only single ice chips - no overt indication of aspiration - but pt silently aspirates.  Audible breathing noted - concerning for secretion retention.  She declined to consume more po offered including icecream, applesauce and nectar thickened juice.    Would recommend due to above factors, pt received full nutrition via feeding tube and only allow po nectar, applesauce, icecream and ice from floor stock given by RN, SlP or NT with SlP follow up. SLP advised pt and her son to recommendations using teach back.      HPI HPI: 49 year old with alcohol abuse, alcohol withdrawal, necrotizing fasciitis of left buttock status post debridement. Acute respiratory failure secondary to septic shock. Intubated from 6/8 to 6/23.   Pt underwent MBS on Saturday 01/09/2020 with findings of silent aspiration of nectar and thin.  Recommended to start diet of dys1/nectar.  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  ENT advised pt high aspiration risk.  Follow up indicated.      SLP Plan  Continue with current plan of care       Recommendations  Diet recommendations: NPO (ice, icecream, applesauce and nectar thick liquids via floor stock) Supervision: Full supervision/cueing for compensatory strategies;Staff to assist with self feeding Compensations: Slow rate;Small sips/bites;Minimize environmental distractions                Oral Care Recommendations: Oral care QID Follow up Recommendations: Skilled Nursing facility SLP Visit Diagnosis: Dysphagia, oropharyngeal phase (R13.12) Plan: Continue with current plan of care       GO                Macario Golds 01/12/2020, 1:22 PM   Kathleen Lime, MS Time Office [phone]

## 2020-01-12 NOTE — Consult Note (Signed)
Reason for Consult: Breathing and swallowing difficulty Referring Physician: Mariel Aloe, MD  Alyssa Carey is an 49 y.o. female.  HPI: Complicated medical history including morbid obesity, alcohol abuse, recently hospitalized with necrotizing fasciitis of the lower extremity.  Was intubated for prolonged amount of time, extubated about 5 days ago.  Since then having some trouble swallowing, very weak voice and possible stridor.  History reviewed. No pertinent past medical history.  Past Surgical History:  Procedure Laterality Date  . FLEXIBLE SIGMOIDOSCOPY N/A 01/11/2020   Procedure: FLEXIBLE SIGMOIDOSCOPY;  Surgeon: Carol Ada, MD;  Location: [facility];  Service: Gastroenterology;  Laterality: N/A;  . INCISION AND DRAINAGE PERIRECTAL ABSCESS N/A 12/25/2019   Procedure: IRRIGATION AND DEBRIDEMENT BUTTOCKS, LAP LOOP COLOSTOMY;  Surgeon: Greer Pickerel, MD;  Location: [facility];  Service: General;  Laterality: N/A;  . IRRIGATION AND DEBRIDEMENT ABSCESS N/A 12/23/2019   Procedure: EXCISION AND DEBRIDEMENT LEFT BUTTOCK AND PERINEUM;  Surgeon: Clovis Riley, MD;  Location: [facility];  Service: General;  Laterality: N/A;  . LAPAROSCOPIC LOOP COLOSTOMY N/A 12/25/2019   Procedure: LAPAROSCOPIC LOOP COLOSTOMY;  Surgeon: Greer Pickerel, MD;  Location: [facility];  Service: General;  Laterality: N/A;    History reviewed. No pertinent family history.  Social History:  reports that she has been smoking. She has never used smokeless tobacco. She reports current alcohol use. She reports previous drug use.  Allergies:  Allergies  Allergen Reactions  . Penicillins     Did it involve swelling of the face/tongue/throat, SOB, or low BP? N Did it involve sudden or severe rash/hives, skin peeling, or any reaction on the inside of your mouth or nose? Y Did you need to seek medical attention at a hospital or doctor's office? N When did it last happen?Childhood If all above answers are "NO", may proceed  with cephalosporin use.     Medications: Reviewed  Results for orders placed or performed during the hospital encounter of 12/21/19 (from the past 48 hour(s))  Glucose, capillary     Status: Abnormal   Collection Time: 01/10/20  3:57 PM  Result Value Ref Range   Glucose-Capillary 105 (H) 70 - 99 mg/dL    Comment: Glucose reference range applies only to samples taken after fasting for at least 8 hours.   Comment 1 Notify RN    Comment 2 Document in Chart   Occult blood card to lab, stool RN will collect     Status: Abnormal   Collection Time: 01/10/20  4:24 PM  Result Value Ref Range   Fecal Occult Bld POSITIVE (A) NEGATIVE    Comment: Performed at [facility], [place] [address] [place], [place] [address]  Hemoglobin and hematocrit, blood     Status: Abnormal   Collection Time: 01/10/20  5:30 PM  Result Value Ref Range   Hemoglobin 7.1 (L) 12.0 - 15.0 g/dL   HCT 23.4 (L) 36 - 46 %    Comment: Performed at [facility], [place] [address] [place], [place] [address]  Basic metabolic panel     Status: Abnormal   Collection Time: 01/10/20  5:30 PM  Result Value Ref Range   Sodium 145 135 - 145 mmol/L   Potassium 3.7 3.5 - 5.1 mmol/L    Comment: DELTA CHECK NOTED   Chloride 113 (H) 98 - 111 mmol/L   CO2 27 22 - 32 mmol/L   Glucose, Bld 287 (H) 70 - 99 mg/dL    Comment: Glucose reference range  applies only to samples taken after fasting for at least 8 hours.   BUN 10 6 - 20 mg/dL   Creatinine, Ser <0.30 (L) 0.44 - 1.00 mg/dL   Calcium 7.5 (L) 8.9 - 10.3 mg/dL   GFR calc non Af Amer NOT CALCULATED >60 mL/min   GFR calc Af Amer NOT CALCULATED >60 mL/min   Anion gap 5 5 - 15    Comment: Performed at [facility], [place] [address], [place], [place] [address]  Glucose, capillary     Status: Abnormal   Collection Time: 01/10/20  7:21 PM  Result Value Ref Range   Glucose-Capillary 188 (H) 70 - 99 mg/dL    Comment: Glucose reference  range applies only to samples taken after fasting for at least 8 hours.  Glucose, capillary     Status: Abnormal   Collection Time: 01/11/20 12:07 AM  Result Value Ref Range   Glucose-Capillary 117 (H) 70 - 99 mg/dL    Comment: Glucose reference range applies only to samples taken after fasting for at least 8 hours.  Basic metabolic panel     Status: Abnormal   Collection Time: 01/11/20  3:43 AM  Result Value Ref Range   Sodium 147 (H) 135 - 145 mmol/L   Potassium 2.7 (LL) 3.5 - 5.1 mmol/L    Comment: CRITICAL RESULT CALLED TO, READ BACK BY AND VERIFIED WITH: WEAVER, McDougal @ [phone] 01/11/2020 PERRY, J.    Chloride 115 (H) 98 - 111 mmol/L   CO2 27 22 - 32 mmol/L   Glucose, Bld 109 (H) 70 - 99 mg/dL    Comment: Glucose reference range applies only to samples taken after fasting for at least 8 hours.   BUN 10 6 - 20 mg/dL   Creatinine, Ser <0.30 (L) 0.44 - 1.00 mg/dL   Calcium 7.8 (L) 8.9 - 10.3 mg/dL   GFR calc non Af Amer NOT CALCULATED >60 mL/min   GFR calc Af Amer NOT CALCULATED >60 mL/min   Anion gap 5 5 - 15    Comment: Performed at [facility], [place] [address], [place], [place] [address]  Protime-INR     Status: None   Collection Time: 01/11/20  3:43 AM  Result Value Ref Range   Prothrombin Time 14.9 11.4 - 15.2 seconds   INR 1.2 0.8 - 1.2    Comment: (NOTE) INR goal varies based on device and disease states. Performed at [facility], [place] [address], [place], [place] [address]   Glucose, capillary     Status: None   Collection Time: 01/11/20  4:14 AM  Result Value Ref Range   Glucose-Capillary 89 70 - 99 mg/dL    Comment: Glucose reference range applies only to samples taken after fasting for at least 8 hours.  Glucose, capillary     Status: Abnormal   Collection Time: 01/11/20  7:46 AM  Result Value Ref Range   Glucose-Capillary 121 (H) 70 - 99 mg/dL    Comment: Glucose reference range applies only to samples taken after fasting  for at least 8 hours.   Comment 1 Notify RN    Comment 2 Document in Chart   Magnesium     Status: None   Collection Time: 01/11/20  8:04 AM  Result Value Ref Range   Magnesium 1.8 1.7 - 2.4 mg/dL    Comment: Performed at [facility], [place] [address], [place], [place] [address]  Urinalysis, Routine w reflex microscopic  Status: Abnormal   Collection Time: 01/11/20  9:01 AM  Result Value Ref Range   Color, Urine AMBER (A) YELLOW    Comment: BIOCHEMICALS MAY BE AFFECTED BY COLOR   APPearance HAZY (A) CLEAR   Specific Gravity, Urine 1.023 1.005 - 1.030   pH 5.0 5.0 - 8.0   Glucose, UA NEGATIVE NEGATIVE mg/dL   Hgb urine dipstick NEGATIVE NEGATIVE   Bilirubin Urine NEGATIVE NEGATIVE   Ketones, ur NEGATIVE NEGATIVE mg/dL   Protein, ur 30 (A) NEGATIVE mg/dL   Nitrite NEGATIVE NEGATIVE   Leukocytes,Ua SMALL (A) NEGATIVE   RBC / HPF 21-50 0 - 5 RBC/hpf   WBC, UA >50 (H) 0 - 5 WBC/hpf   Bacteria, UA RARE (A) NONE SEEN   Squamous Epithelial / LPF 0-5 0 - 5   Mucus PRESENT    Hyaline Casts, UA PRESENT    Non Squamous Epithelial 0-5 (A) NONE SEEN    Comment: Performed at [facility], [place] [address], [place], [place] [address]  CBC     Status: Abnormal   Collection Time: 01/11/20  9:24 AM  Result Value Ref Range   WBC 14.5 (H) 4.0 - 10.5 K/uL   RBC 2.45 (L) 3.87 - 5.11 MIL/uL   Hemoglobin 7.6 (L) 12.0 - 15.0 g/dL   HCT 25.1 (L) 36 - 46 %   MCV 102.4 (H) 80.0 - 100.0 fL    Comment: DELTA CHECK NOTED REPEATED TO VERIFY    MCH 31.0 26.0 - 34.0 pg   MCHC 30.3 30.0 - 36.0 g/dL   RDW 28.1 (H) 11.5 - 15.5 %   Platelets 488 (H) 150 - 400 K/uL   nRBC 0.1 0.0 - 0.2 %    Comment: Performed at [facility], [place] [address] [place], [place] [address]  Glucose, capillary     Status: Abnormal   Collection Time: 01/11/20 12:04 PM  Result Value Ref Range   Glucose-Capillary 106 (H) 70 - 99 mg/dL    Comment: Glucose reference range  applies only to samples taken after fasting for at least 8 hours.   Comment 1 Notify RN    Comment 2 Document in Chart   TSH     Status: None   Collection Time: 01/11/20  3:53 PM  Result Value Ref Range   TSH 2.739 0.350 - 4.500 uIU/mL    Comment: Performed by a 3rd Generation assay with a functional sensitivity of <=0.01 uIU/mL. Performed at [facility], [place] [address] [place], [place] [address]   Glucose, capillary     Status: Abnormal   Collection Time: 01/11/20  4:52 PM  Result Value Ref Range   Glucose-Capillary 106 (H) 70 - 99 mg/dL    Comment: Glucose reference range applies only to samples taken after fasting for at least 8 hours.   Comment 1 Notify RN    Comment 2 Document in Chart   Potassium     Status: Abnormal   Collection Time: 01/11/20  6:00 PM  Result Value Ref Range   Potassium 6.7 (HH) 3.5 - 5.1 mmol/L    Comment: REPEATED TO VERIFY CRITICAL RESULT CALLED TO, READ BACK BY AND VERIFIED WITH: Shyrl Numbers RN 0277 01/11/20 JM DELTA CHECK NOTED Performed at [place] [address] [place],  [address]   Potassium     Status: Abnormal   Collection Time: 01/11/20  8:02 PM  Result Value Ref Range   Potassium 3.4 (L) 3.5 -  5.1 mmol/L    Comment: DELTA CHECK NOTED Performed at [facility], [place] [address] [place], [place] [address]   Glucose, capillary     Status: None   Collection Time: 01/11/20  8:37 PM  Result Value Ref Range   Glucose-Capillary 89 70 - 99 mg/dL    Comment: Glucose reference range applies only to samples taken after fasting for at least 8 hours.  Glucose, capillary     Status: None   Collection Time: 01/11/20 11:58 PM  Result Value Ref Range   Glucose-Capillary 80 70 - 99 mg/dL    Comment: Glucose reference range applies only to samples taken after fasting for at least 8 hours.   Comment 1 Notify RN    Comment 2 Document in Chart   Glucose, capillary     Status: None    Collection Time: 01/12/20  3:31 AM  Result Value Ref Range   Glucose-Capillary 84 70 - 99 mg/dL    Comment: Glucose reference range applies only to samples taken after fasting for at least 8 hours.   Comment 1 Notify RN    Comment 2 Document in Chart   Basic metabolic panel     Status: Abnormal   Collection Time: 01/12/20  4:19 AM  Result Value Ref Range   Sodium 143 135 - 145 mmol/L   Potassium 3.2 (L) 3.5 - 5.1 mmol/L   Chloride 114 (H) 98 - 111 mmol/L   CO2 26 22 - 32 mmol/L   Glucose, Bld 100 (H) 70 - 99 mg/dL    Comment: Glucose reference range applies only to samples taken after fasting for at least 8 hours.   BUN 6 6 - 20 mg/dL   Creatinine, Ser <0.30 (L) 0.44 - 1.00 mg/dL   Calcium 7.5 (L) 8.9 - 10.3 mg/dL   GFR calc non Af Amer NOT CALCULATED >60 mL/min   GFR calc Af Amer NOT CALCULATED >60 mL/min   Anion gap 3 (L) 5 - 15    Comment: Performed at [facility], [place] [address], [place], [place] [address]  Glucose, capillary     Status: None   Collection Time: 01/12/20  8:04 AM  Result Value Ref Range   Glucose-Capillary 79 70 - 99 mg/dL    Comment: Glucose reference range applies only to samples taken after fasting for at least 8 hours.   Comment 1 Notify RN    Comment 2 Document in Chart   CBC     Status: Abnormal   Collection Time: 01/12/20  8:48 AM  Result Value Ref Range   WBC 11.6 (H) 4.0 - 10.5 K/uL   RBC 2.73 (L) 3.87 - 5.11 MIL/uL   Hemoglobin 8.6 (L) 12.0 - 15.0 g/dL   HCT 28.0 (L) 36 - 46 %   MCV 102.6 (H) 80.0 - 100.0 fL   MCH 31.5 26.0 - 34.0 pg   MCHC 30.7 30.0 - 36.0 g/dL   RDW 26.9 (H) 11.5 - 15.5 %   Platelets 474 (H) 150 - 400 K/uL   nRBC 0.0 0.0 - 0.2 %    Comment: Performed at [facility], [place] [address], [place], [place] [address]    CT CHEST WO CONTRAST  Result Date: 01/10/2020 CLINICAL DATA:  Shortness of breath, pleural effusion, abnormal chest x-ray EXAM: CT CHEST WITHOUT CONTRAST TECHNIQUE:  Multidetector CT imaging of the chest was performed following the standard protocol without IV contrast. COMPARISON:  01/10/2020 FINDINGS: Cardiovascular: Unenhanced imaging  of the heart demonstrates no pericardial effusion. Normal caliber of the thoracic aorta. No significant atherosclerosis. Left-sided PICC extends to the atrial caval junction. Mediastinum/Nodes: No enlarged mediastinal or axillary lymph nodes. Thyroid gland and esophagus are grossly unremarkable. There is circumferential narrowing of the subglottic airway, which could reflect mucosal edema or stenosis related previous intubation. Lungs/Pleura: Dependent atelectasis is identified within the lower lobes, left greater than right. There are trace bilateral pleural effusions, left greater than right. No acute airspace disease or pneumothorax. The described abnormality at the right hilum on prior chest x-ray likely reflects confluence of atelectasis and vascular shadows. Upper Abdomen: There is extensive body wall edema within the bilateral flanks. No acute intra-abdominal process. Musculoskeletal: No acute or destructive bony lesions. Reconstructed images demonstrate no additional findings. IMPRESSION: 1. Trace bilateral pleural effusions, left greater than right, with bilateral dependent lower lobe atelectasis. 2. Circumferential narrowing of the subglottic airway, which could reflect mucosal edema or stenosis related to previous intubation. Electronically Signed   By: Randa Ngo M.D.   On: 01/10/2020 19:25   DG CHEST PORT 1 VIEW  Result Date: 01/12/2020 CLINICAL DATA:  Acute respiratory failure with hypoxia. EXAM: PORTABLE CHEST 1 VIEW COMPARISON:  01/10/2020 FINDINGS: The left PICC line is in good position, unchanged. Stable cardiac enlargement and prominent mediastinal contours. Central vascular congestion and possible perihilar pulmonary edema. No large pleural effusions. Bibasilar atelectasis is noted. IMPRESSION: 1. Stable cardiac  enlargement, vascular congestion and possible perihilar pulmonary edema. 2. Bibasilar atelectasis. Electronically Signed   By: Marijo Sanes M.D.   On: 01/12/2020 11:40    JKD:[id number] except as listed in admit H&P  Blood pressure (!) 140/97, pulse (!) 116, temperature 98.4 F (36.9 C), temperature source Axillary, resp. rate (!) 33, height 5' 4"  (1.626 m), weight 116 kg, last menstrual period 03/23/2019, SpO2 100 %.  PHYSICAL EXAM: Overall appearance: Obese lady, in no distress.  Her breathing is pretty quiet.  There is no detectable stridor or distress.  Voice is breathy. Head:  Normocephalic, atraumatic. Ears: External ears look healthy. Nose: External nose is healthy in appearance. Internal nasal exam free of any lesions or obstruction.  There is a nasogastric feeding tube in the left side. Oral Cavity/Pharynx:  There are no mucosal lesions or masses identified. Larynx/Hypopharynx: See below Neuro:  No identifiable neurologic deficits. Neck: No palpable neck masses.  Studies Reviewed: none  Procedures: Flexible fiberoptic laryngoscopy.  The right nasal cavity was treated with topical oxymetazoline spray and 4% Xylocaine solution.  The flexible endoscope was passed through the right nasal cavity into the nasopharynx.  The nasopharynx was clear.  The oropharynx and hypopharynx were clear as well.  There are no masses or mucosal lesions identified.  The endolarynx does not reveal any mucosal lesions.  There may be some swelling of the subglottis anteriorly but no obvious obstruction.  The right cord is barely moving at all.  The left side is paretic.  With forced coughing she is able to close her glottis reasonably tightly.  There is no pooling of secretions.   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  Compression of the recurrent nerve branch as it enters the larynx can cause this from intubation, and oftentimes this will clear over several  weeks.  Recommend continue to monitor.  She is a very high aspiration risk, continue working with speech pathology.  Izora Gala 01/12/2020, 12:09 PM

## 2020-01-12 NOTE — Progress Notes (Addendum)
[facility] ROUNDING NOTE   Subjective: Alert awake, non communicative  No report of bleeding per rectum overnight, no clots Hemoglobin stable  Objective: Vital signs in last 24 hours: Temp:  [98.4 F (36.9 C)-99.1 F (37.3 C)] 98.4 F (36.9 C) (06/28 0800) Pulse Rate:  [32-133] 116 (06/28 0900) Resp:  [16-36] 33 (06/28 0900) BP: (115-153)/(63-107) 140/97 (06/28 0900) SpO2:  [95 %-100 %] 100 % (06/28 0900) Weight:  [283 kg] 116 kg (06/28 0500) Last BM Date: 01/12/20 General: NAD Large left buttock wound packed with gauze, soaked with serosanguineous discharge.  No clots.   Intake/Output from previous day: 06/27 0701 - 06/28 0700 In: 1290 [I.V.:1090.2; IV Piggyback:199.9] Out: 1950 [Urine:850; Stool:750] Intake/Output this shift: No intake/output data recorded.   Lab Results: Recent Labs    01/10/20 0500 01/10/20 0500 01/10/20 1730 01/11/20 0924 01/12/20 0848  WBC 14.7*  --   --  14.5* 11.6*  HGB 6.9*   < > 7.1* 7.6* 8.6*  PLT 498*  --   --  488* 474*  MCV 110.9*  --   --  102.4* 102.6*   < > = values in this interval not displayed.   BMET Recent Labs    01/10/20 1730 01/10/20 1730 01/11/20 0343 01/11/20 0343 01/11/20 1800 01/11/20 2002 01/12/20 0419  NA 145  --  147*  --   --   --  143  K 3.7   < > 2.7*   < > 6.7* 3.4* 3.2*  CL 113*  --  115*  --   --   --  114*  CO2 27  --  27  --   --   --  26  GLUCOSE 287*  --  109*  --   --   --  100*  BUN 10  --  10  --   --   --  6  CREATININE <0.30*  --  <0.30*  --   --   --  <0.30*  CALCIUM 7.5*  --  7.8*  --   --   --  7.5*   < > = values in this interval not displayed.   LFT No results for input(s): PROT, ALBUMIN, AST, ALT, ALKPHOS, BILITOT, BILIDIR, IBILI in the last 72 hours. PT/INR Recent Labs    01/11/20 0343  INR 1.2      Imaging/Other results: CT CHEST WO CONTRAST  Result Date: 01/10/2020 CLINICAL DATA:  Shortness of breath, pleural effusion, abnormal chest x-ray EXAM: CT  CHEST WITHOUT CONTRAST TECHNIQUE: Multidetector CT imaging of the chest was performed following the standard protocol without IV contrast. COMPARISON:  01/10/2020 FINDINGS: Cardiovascular: Unenhanced imaging of the heart demonstrates no pericardial effusion. Normal caliber of the thoracic aorta. No significant atherosclerosis. Left-sided PICC extends to the atrial caval junction. Mediastinum/Nodes: No enlarged mediastinal or axillary lymph nodes. Thyroid gland and esophagus are grossly unremarkable. There is circumferential narrowing of the subglottic airway, which could reflect mucosal edema or stenosis related previous intubation. Lungs/Pleura: Dependent atelectasis is identified within the lower lobes, left greater than right. There are trace bilateral pleural effusions, left greater than right. No acute airspace disease or pneumothorax. The described abnormality at the right hilum on prior chest x-ray likely reflects confluence of atelectasis and vascular shadows. Upper Abdomen: There is extensive body wall edema within the bilateral flanks. No acute intra-abdominal process. Musculoskeletal: No acute or destructive bony lesions. Reconstructed images demonstrate no additional findings. IMPRESSION: 1. Trace bilateral pleural effusions, left greater than right, with bilateral  dependent lower lobe atelectasis. 2. Circumferential narrowing of the subglottic airway, which could reflect mucosal edema or stenosis related to previous intubation. Electronically Signed   By: Randa Ngo M.D.   On: 01/10/2020 19:25      Assessment &Plan:  46 yr F with history of morbid obesity, EtOH withdrawal, cirrhosis, necrotizing fasciitis s/p debridement with diverting loop colostomy Extubated, off pressors  Large volume clots per rectum s/p Flex sig by Dr. Benson [place] yesterday Solitary 34m ulcer in the distal rectum consistent with stercoral ulcer, no visible vessel  Hemoglobin remained stable with no further  bleeding  Mesalamine suppository per rectum at bedtime X 7-10 days Turn in bed every 2-4 hours as tolerated Wound care per surgery  GI will sign off, available if have any questions   K. VDenzil Magnuson, MD [phone] [facility]  Addendum:  Necrotizing fascitis s/p debridement with large wound tunneling close to rectum. RN paged to inform that it will be difficult to insert the mesalamine suppository, will DC it.

## 2020-01-12 NOTE — Progress Notes (Addendum)
   01/12/20 1200 Hydro therapy note.  Subjective Assessment  Subjective patient somnolent, shakes head to sanswer  Patient and Family Stated Goals agreed to wound care today, nods yes   Date of Onset  (abscess present on admission)  Prior Treatments s/p surgical I & D x2, diverting colostomy  Evaluation and Treatment  Evaluation and Treatment Procedures Explained to Patient/Family Yes (eyes open, does not follow)  Evaluation and Treatment Procedures Patient unable to consent due to mental status  Wound / Incision (Open or Dehisced) 12/27/19 Non-pressure wound Buttocks Left *PT ONLY* open wound L gluteal area  Date First Assessed/Time First Assessed: 12/27/19 1410   Wound Type: Non-pressure wound  Location: Buttocks  Location Orientation: Left  Wound Description (Comments): *PT ONLY* open wound L gluteal area  Present on Admission: (c)   Dressing Type Moist to dry (santyl, but did not place as did not see it)  Dressing Changed New  Dressing Status Clean  Dressing Change Frequency Daily  % Wound base Red or Granulating 80%  % Wound base Yellow/Fibrinos Exudateu 10%  % Wound base Other/Granulation Tissue (Comment) 10% (fatty ts at 1200)  Peri-wound Assessment  (healing skin tears at guteal)  Wound Length (cm) 20 cm  Wound Width (cm) 13 cm  Wound Depth (cm) 8 cm  Wound Volume (cm^3) 2080 cm^3  Wound Surface Area (cm^2) 260 cm^2  Tunneling (cm) 6 ( at 4:00)  Margins Unattached edges (unapproximated)  Drainage Amount Moderate  Drainage Description Serosanguineous  Non-staged Wound Description Full thickness  Treatment Debridement (Selective);Hydrotherapy (Pulse lavage);Packing (Saline gauze)  Hydrotherapy  Pulsed lavage therapy - wound location L gluteal area   Pulsed Lavage with Suction (psi) 12 psi  Pulsed Lavage with Suction - Normal Saline Used 1000 mL  Pulsed Lavage Tip Tip with splash shield  Selective Debridement  Selective Debridement - Location left glute  Selective  Debridement - Tools Used Forceps;Scalpel  Selective Debridement - Tissue Removed necrotic fat, slough   Wound Therapy - Assess/Plan/Recommendations  Wound Therapy - Clinical Statement patient  is awake. more somnolent today. The  wound is pink and filling in in the base. area of necrosis remains at top of wound and slough/connective tissue at 2-3:00.. Patient is premediciated prior and does not indicate being in pain. Surgery PA in. To continue santyl , Hydro 3 x's a week.  Wound Therapy - Functional Problem List immobility,   Factors Delaying/Impairing Wound Healing Substance abuse;Immobility;Multiple medical problems  Hydrotherapy Plan Debridement;Dressing change;Patient/family education;Pulsatile lavage with suction  Wound Therapy - Frequency 3X / week  Wound Therapy - Current Recommendations PT  Is on Kreg tilt bed now.   Wound Therapy - Follow Up Recommendations Skilled nursing facility [facility]) ? CIR if improves.  Wound Plan Pt will benefit from hydrotherapy as part a multi-modal/multi-disciplinary wound care to approach to facilitate healing and decr bioburden.   Wound Therapy Goals - Improve the function of patient's integumentary system by progressing the wound(s) through the phases of wound healing by:  Decrease Necrotic Tissue to 75  Increase Granulation Tissue to 25  Improve Drainage Characteristics Mod  Goals/treatment plan/discharge plan were made with and agreed upon by patient/family No, Patient unable to participate in goals/treatment/discharge plan and family unavailable  Time For Goal Achievement Other (comment) (3 wks)  Wound Therapy - Potential for Goals Fair  [place] Pager [phone] Office [phone]

## 2020-01-12 NOTE — Progress Notes (Signed)
PROGRESS NOTE    Alyssa Carey  TML:[id number] DOB: 08/03/1970 DOA: 12/21/2019 PCP: Patient, No Pcp Per   Brief Narrative: Alyssa Carey is a 49 y.o. female that presented secondary to concern for alcohol withdrawal and quickly developed evidence of necrotizing fasciitis.  This was emergently managed in the OR by general surgery via I&D. Patient required intubation, vasopressor support and ICU admission for septic shock. She underwent repeat I&D and diverting colostomy. She has now been extubate and is off vasopressors. Hydrotherapy started.    ICU significant events: 6/06 admission 6/08 ICU admission to OR ofr I&D, back intubated and on pressors, 6/09 started tube feeds. Still pressor dependent, kept on vent w/ anticipated return to OR planned  6/10 Back to the OR for significant debridement and also placement of diverting colostomy 6/11 Sedated, low-dose phenylephrine. CCS felt wound looking good.  Sedation changed to precedex / PRN fent  6/12 more awake, on 15/5 pressure support 6/13 family discussion >> DNR, continue medical care 6//15 transfuse 1 unit PRBC; vomiting, tube feeds held 6/16 Vomiting overnight 6/17 Trial PSV 15/5 with variable Vt, remains on vasopressors.  Vomiting overnight  6/18 transfuse 1 unit PRBC 6/20 Off pressors,  On precedex and weaning on PSV  6/21 Weaning on PSV, planned hydrotherapy 6/23 Extubated 6/24 Transferred off of ICU service   Assessment & Plan:   Principal Problem:   Necrotizing fasciitis of pelvic region and thigh [facility]) Active Problems:   Fall at home, initial encounter   Weakness   Alcoholism ([place])   Thrombocytopenia ([place])   ARF (acute renal failure) ([place])   Hepatic encephalopathy ([place])   Alcoholic liver disease ([place])   Pressure injury of skin   Elevated LFTs   Acute respiratory failure with hypoxemia ([place])   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  General surgery was consulted and patient was emergently sent to the OR for incision and debridement.  Antibiotics as mentioned below.  Patient underwent repeat incision and drainage in addition to diverting loop colostomy to aid in healing of the wound.  Patient has now started hydrotherapy. Bleeding of wound overnight which has resolved.  Septic shock Secondary to necrotizing fasciitis.  Patient required ICU admission, intubation, vasopressor support.  Patient was initially managed on vancomycin, clindamycin, ceftriaxone.  Vancomycin was eventually transitioned to linezolid and clindamycin transition to Flagyl.  Patient was treated with antibiotics from 6/7 until 6/22. Culture data from wound significant for multiple organisms: E. coli, vancomycin resistant Enterococcus  Subglottic airway narrowing Circumferential. Seen incidentally on CT scan. ENT consulted and performed flexible laryngoscopy with evidence of some swelling of anterior subglottis and no obvious obstruction.  [place] Right vocal cord paralysis ENT consulted and performed flexible laryngoscopy significant for evidence of right sided vocal cord paralysis. Likely secondary to prolonged intubation. Associated left sided vocal cord paresis. Will likely improved in several weeks.  Acute respiratory failure with hypoxia Patient required mechanical ventilation from 6/8 until 6/23.  Patient now requiring 2 L via nasal cannula. Still with some associated tachypnea. -Continue to wean to room air as able  Hypernatremia Secondary to poor free water intake.  Urine sodium osmolality obtain which was significant for an appropriately high urine osmolality of 706. Resolved initially and now worsened overnight. -Continue D5 IV fluids (100 ml/hr) w/potassium -Serial BMP  Anasarca Secondary to poor nutrition and resultant hypoalbuminemia in addition  to IV fluids. Albumin of 1.4.  -Nutrition management.  Malnutrition Not present on admission. Secondary to critical illness.  Patient was initially on tube feeds while intubated.  OG tube was removed.  Speech therapy has evaluated at bedside in addition to modified barium swallow and recommends a dysphagia 1 diet. Albumin of 1.4. Prealbumin is undetectable at less than 5. -Dysphagia 1 diet -Insert NG tube for tube feeding; nutrition consult for recommendations  Anemia of critical illness Acute blood loss anemia Baseline hemoglobin of 12-13.  Patient developed anemia in setting of severe infection/wound in addition to multiple surgical interventions.  Patient has received 3 units of PRBC to date.  Hemoglobin currently stable.  Bleeding from wound overnight (6/26). Acute drop of hemoglobin to 6.9. Improvement of hemoglobin to 7.1 post transfusion and stable. Initially thought to have wound hemorrhage, however it appears patient is having rectal hemorrhage. Hemoglobin stable. Subcutaneous heparin held on 6/26 -General surgery: rectal bleeding, GI consulted and performed bedside flexible sigmoidoscopy significant for stercoral ulcer. -Daily CBC -Blood transfusion as needed  Stercoral ulcer Diagnosed on flexible sigmoidoscopy (6/28). 10 mm ulcer in distal rectum. -GI recommendations: mesalamine suppository per rectum x7-10 days  Thrombocytopenia Present early in admission.  In setting of critical illness in addition to alcohol use and liver disease.  Patient received 2 units of platelets.  Currently resolved.  Hypokalemia Recurrent issue. Normal magnesium.  -IV fluids with potassium  Hyperglycemia Hemoglobin A1c of 5.7.  Patient meets criteria for prediabetes.  Recommend diet modification for treatment.  Could consider metformin as well as an outpatient.  With patient's current weight, high likelihood weight loss could prevent the need for any medication management.   Alcoholic  hepatitis Hepatic encephalopathy Present on admission.  Now resolved.  Was previously on lactulose which has been held in light of current buttock wound.  AKI Patient with unknown baseline.  Patient with a creatinine of 1.74 on admission.  AKI currently resolved.  Morbid obesity Body mass index is 43.9 kg/m.  Pressure injury Deep tissue injury of right ear, not present on admission   DVT prophylaxis: SCDs Code Status:   Code Status: DNR Family Communication: None at bedside. Disposition Plan: Anticipate patient will likely discharge to LTAC.   Patient will be receiving continued hydrotherapy per PT.  Pending management of GI bleeding, stabilization of respiratory status and tachycardia.   Consultants:   PCCM  General surgery  Gastroenterology ([place])  Procedures:   ENDOTRACHEAL INTUBATION (6/8 >> 01/07/2020)  INCISION AND DEBRIDEMENT OF LEFT BUTTOCK AND PERINEUM (12/23/2019)  IRRIGATION AND DEBRIDEMENT OF BUTTOCKS; LAPAROSCOPIC LOOP COLOSTOMY (12/25/2019)  FLEXIBLE SIGMOIDOSCOPY (01/11/2020) Impression:               - A single (solitary) ulcer in the rectum.                           - No specimens collected.  Recommendation:           - Continue with the current supportive care.                           - No specific recommendations given the patient's                            overall condition and findings. There was no  evidence of any solid stool that is the typical                            source to induce a stercoral ulcer.   FLEXIBLE LARYNGOSCOPY (01/12/2020) The right nasal cavity was treated with topical oxymetazoline spray and 4% Xylocaine solution.  The flexible endoscope was passed through the right nasal cavity into the nasopharynx.  The nasopharynx was clear.  The oropharynx and hypopharynx were clear as well.  There are no masses or mucosal lesions identified.  The endolarynx does not reveal any mucosal lesions.  There may be  some swelling of the subglottis anteriorly but no obvious obstruction.  The right cord is barely moving at all.  The left side is paretic.  With forced coughing she is able to close her glottis reasonably tightly.  There is no pooling of secretions.   Antimicrobials:  Ceftriaxone  Vancomycin  Clindamycin  Linezolid  Flagyl   Subjective: No dyspnea per patient.  Objective: Vitals:   01/12/20 0600 01/12/20 0800 01/12/20 0900 01/12/20 1200  BP: (!) 149/86 (!) 157/93 (!) 140/97   Pulse:  (!) 119 (!) 116   Resp: (!) 34 (!) 36 (!) 33   Temp:  98.4 F (36.9 C)  99.1 F (37.3 C)  TempSrc:  Axillary  Axillary  SpO2:  100% 100%   Weight:      Height:        Intake/Output Summary (Last 24 hours) at 01/12/2020 1254 Last data filed at 01/12/2020 1200 Gross per 24 hour  Intake 1984.18 ml  Output 2130 ml  Net -145.82 ml   Filed Weights   01/10/20 0500 01/11/20 0433 01/12/20 0500  Weight: 115.6 kg 114.1 kg 116 kg    Examination:  General exam: Appears calm and comfortable Respiratory system: Rhonchi. Tachypnea. Cardiovascular system: S1 & S2 heard, RRR. No murmurs, rubs, gallops or clicks. Gastrointestinal system: Abdomen is nondistended, soft and nontender. No organomegaly or masses felt. Normal bowel sounds heard. Central nervous system: Alert Musculoskeletal: Edema of arms and legs. No calf tenderness Skin: No cyanosis. No rashes Psychiatry: Judgement and insight appear normal. Flat affect   Data Reviewed: I have personally reviewed following labs and imaging studies  CBC Lab Results  Component Value Date   WBC 11.6 (H) 01/12/2020   RBC 2.73 (L) 01/12/2020   HGB 8.6 (L) 01/12/2020   HCT 28.0 (L) 01/12/2020   MCV 102.6 (H) 01/12/2020   MCH 31.5 01/12/2020   PLT 474 (H) 01/12/2020   MCHC 30.7 01/12/2020   RDW 26.9 (H) 01/12/2020   LYMPHSABS 3.4 12/28/2019   MONOABS 0.8 12/28/2019   EOSABS 0.2 12/28/2019   BASOSABS 0.1 02/77/4128     Last metabolic  panel Lab Results  Component Value Date   NA 143 01/12/2020   K 3.2 (L) 01/12/2020   CL 114 (H) 01/12/2020   CO2 26 01/12/2020   BUN 6 01/12/2020   CREATININE <0.30 (L) 01/12/2020   GLUCOSE 100 (H) 01/12/2020   GFRNONAA NOT CALCULATED 01/12/2020   GFRAA NOT CALCULATED 01/12/2020   CALCIUM 7.5 (L) 01/12/2020   PHOS 3.3 12/30/2019   PROT 5.9 (L) 01/02/2020   ALBUMIN 1.4 (L) 01/02/2020   BILITOT 1.0 01/02/2020   ALKPHOS 117 01/02/2020   AST 39 01/02/2020   ALT 21 01/02/2020   ANIONGAP 3 (L) 01/12/2020    CBG (last 3)  Recent Labs    01/12/20 0331 01/12/20 0804 01/12/20  [place] 79 98     GFR: CrCl cannot be calculated (This lab value cannot be used to calculate CrCl because it is not a number: <0.30).  Coagulation Profile: Recent Labs  Lab 01/11/20 0343  INR 1.2    No results found for this or any previous visit (from the past 240 hour(s)).      Radiology Studies: DG Abd 1 View  Result Date: 01/12/2020 CLINICAL DATA:  Feeding tube placement. EXAM: ABDOMEN - 1 VIEW COMPARISON:  January 05, 2020. FINDINGS: The bowel gas pattern is normal. Distal tip of feeding tube is seen in expected position of distal stomach or proximal duodenum. No radio-opaque calculi or other significant radiographic abnormality are seen. IMPRESSION: Distal tip of feeding tube seen in expected position of distal stomach or proximal duodenum. Electronically Signed   By: Marijo Conception M.D.   On: 01/12/2020 12:38   CT CHEST WO CONTRAST  Result Date: 01/10/2020 CLINICAL DATA:  Shortness of breath, pleural effusion, abnormal chest x-ray EXAM: CT CHEST WITHOUT CONTRAST TECHNIQUE: Multidetector CT imaging of the chest was performed following the standard protocol without IV contrast. COMPARISON:  01/10/2020 FINDINGS: Cardiovascular: Unenhanced imaging of the heart demonstrates no pericardial effusion. Normal caliber of the thoracic aorta. No significant atherosclerosis. Left-sided PICC extends to  the atrial caval junction. Mediastinum/Nodes: No enlarged mediastinal or axillary lymph nodes. Thyroid gland and esophagus are grossly unremarkable. There is circumferential narrowing of the subglottic airway, which could reflect mucosal edema or stenosis related previous intubation. Lungs/Pleura: Dependent atelectasis is identified within the lower lobes, left greater than right. There are trace bilateral pleural effusions, left greater than right. No acute airspace disease or pneumothorax. The described abnormality at the right hilum on prior chest x-ray likely reflects confluence of atelectasis and vascular shadows. Upper Abdomen: There is extensive body wall edema within the bilateral flanks. No acute intra-abdominal process. Musculoskeletal: No acute or destructive bony lesions. Reconstructed images demonstrate no additional findings. IMPRESSION: 1. Trace bilateral pleural effusions, left greater than right, with bilateral dependent lower lobe atelectasis. 2. Circumferential narrowing of the subglottic airway, which could reflect mucosal edema or stenosis related to previous intubation. Electronically Signed   By: Randa Ngo M.D.   On: 01/10/2020 19:25   DG CHEST PORT 1 VIEW  Result Date: 01/12/2020 CLINICAL DATA:  Acute respiratory failure with hypoxia. EXAM: PORTABLE CHEST 1 VIEW COMPARISON:  01/10/2020 FINDINGS: The left PICC line is in good position, unchanged. Stable cardiac enlargement and prominent mediastinal contours. Central vascular congestion and possible perihilar pulmonary edema. No large pleural effusions. Bibasilar atelectasis is noted. IMPRESSION: 1. Stable cardiac enlargement, vascular congestion and possible perihilar pulmonary edema. 2. Bibasilar atelectasis. Electronically Signed   By: Marijo Sanes M.D.   On: 01/12/2020 11:40        Scheduled Meds: . vitamin C  500 mg Oral BID  . chlorhexidine  15 mL Mouth Rinse BID  . Chlorhexidine Gluconate Cloth  6 each Topical Daily   . collagenase   Topical Daily  . feeding supplement (PIVOT 1.5 CAL)  1,000 mL Per Tube Q24H  . feeding supplement (PRO-STAT SUGAR FREE 64)  30 mL Per Tube Daily  . ferrous sulfate  325 mg Oral BID WC  . folic acid  1 mg Oral Daily  . insulin aspart  0-6 Units Subcutaneous Q4H  . insulin aspart  4 Units Subcutaneous Q4H  . mouth rinse  15 mL Mouth Rinse q12n4p  . multivitamin with minerals  1 tablet Oral Daily  . pantoprazole  40 mg Oral BID  . sodium chloride flush  10-40 mL Intracatheter Q12H  . thiamine  100 mg Oral Daily  . vitamin A  20,000 Units Oral Daily  . Vitamin D (Ergocalciferol)  50,000 Units Oral Q7 days  . zinc sulfate  220 mg Oral Daily   Continuous Infusions: . sodium chloride    . dextrose 5 % with kcl 100 mL/hr at 01/12/20 1200     LOS: 22 days     Cordelia Poche, MD [facility] 01/12/2020, 12:54 PM  If 7PM-7AM, please contact night-coverage [email or web address]

## 2020-01-12 NOTE — TOC Progression Note (Addendum)
Transition of Care [facility]) - Progression Note    Patient Details  Name: Alyssa Carey MRN: [id number] Date of Birth: [birth year]  [facility]) CM/SW Contact  Purcell Mouton, RN Phone Number: 01/12/2020, 3:20 PM  Clinical Narrative:    TOC will continue to follow.   Expected Discharge Plan: [place] Barriers to Discharge: Continued Medical Work up  Expected Discharge Plan and Services Expected Discharge Plan: [place] In-house Referral: Development worker, community                                             Social Determinants of Health (SDOH) Interventions    Readmission Risk Interventions No flowsheet data found.

## 2020-01-12 NOTE — Progress Notes (Signed)
Nutrition Follow-up  DOCUMENTATION CODES:   Obesity unspecified  INTERVENTION:  Confirm placement of NGT prior to use. Once placement is confirmed, may begin the following via NGT:  -Pivot 1.5 cal @ 10m/hr, advance 132mhr Q4H until goal rate of 7079mr (1680m7ms reached -30ml89m-stat daily  At goal, tube feeding regimen will provide 2620 kcals, 172 grams of protein, 1275ml 30m water   Discontinue Vital Cuisine shakes  Continue supplementation of Vitamin A, noted dose of 60,000 IU x 3 days -6/27-7/10 20,000 IU x 14 days  NUTRITION DIAGNOSIS:   Increased nutrient needs related to chronic illness, wound healing (alcohol abuse; necrotizing fasciitis) as evidenced by estimated needs.  Ongoing.  GOAL:   Patient will meet greater than or equal to 90% of their needs  Will address with TF.   MONITOR:   Diet advancement, Labs, Weight trends, Skin, I & O's  REASON FOR ASSESSMENT:   Consult Enteral/tube feeding initiation and management  ASSESSMENT:   Pt with PMH of heavy alcohol history who for one week with decreased intake admitted 6/6 with alcoholic hepatitis with hepatic encephalopathy and AKI.  6/7-admission; initial RD assessment 6/8-Rapid Response; CT pelvis to r/o necrotizing fascitis--found to be positive for necrotizing fascitis and went to OR for I&D 6/9-remained intubated with OGT in place; started TF; added ascorbic acid BID and zinc once/day.  6/10-return to OR for I&D; lap loop colostomy 6/16-vomiting overnight; TF held and OGT to LIS 6/17-vomiting overnight; OGT removed d/t aspiration event and being partly pulled; small bore NGT placed 6/18-restarted TF at trickle rate 6/21- small bore NGT removed; OGT placed 6/23 -extubated, OGT removed 6/27 - s/p flexible sigmoidoscopy  Pt providing no verbal response to RD questions, but indicates "no" when asked about abdominal pain.   Discussed pt with RN.   PO Intake: 25% x 1 recorded meal  Labs:  K+ 3.2 (L), CBGs 79-89 Medications: Vitamin C, Ferrous Sulfate, Folvite, Novolog, MVI, Protonix, Thiamine, Vitamin A, Vitamin D, Zinc sulfate IVF: D5 w/ KCl 40mEq 65m0ml/hr35mP: 850ml x2428mrs I/O: +29,971.8ml since68mmit Colostomy: 750ml x24 h8m   Diet Order:   Diet Order            DIET - DYS 1 Room service appropriate? Yes; Fluid consistency: Nectar Thick  Diet effective now                 EDUCATION NEEDS:   Not appropriate for education at this time  Skin:  Skin Assessment:  (necrotizing fasciitis to L buttock) Skin Integrity Issues:: DTI DTI: R ear Stage II: NA Unstageable: NA Incisions: L buttocks (6/8 + 6/10); abdomen (6/10)  Last BM:  6/28 (colostomy)  Height:   Ht Readings from Last 1 Encounters:  01/05/20 5' 4"  (1.626 m)    Weight:   Wt Readings from Last 1 Encounters:  01/12/20 116 kg    Ideal Body Weight:  54.5 kg  BMI:  Body mass index is 43.9 kg/m.  Estimated Nutritional Needs:   Kcal:  2500-2700 kcal  Protein:  160-175 grams  Fluid:  >/= 2.3 L/day    Amanda AverLarkin InaDN RD pager number and weekend/on-call pager number located in [email or web address]

## 2020-01-12 NOTE — Progress Notes (Signed)
[facility] Progress Note  1 Day Post-Op  Subjective: Seen during hydrotherapy. Wound cleaning up well. No further significant bleeding from wound or rectum.   Objective: Vital signs in last 24 hours: Temp:  [98.4 F (36.9 C)-99.1 F (37.3 C)] 98.4 F (36.9 C) (06/28 0800) Pulse Rate:  [32-133] 116 (06/28 0900) Resp:  [16-36] 33 (06/28 0900) BP: (115-153)/(63-107) 140/97 (06/28 0900) SpO2:  [95 %-100 %] 100 % (06/28 0900) Weight:  [017 kg] 116 kg (06/28 0500) Last BM Date: 01/12/20  Intake/Output from previous day: 06/27 0701 - 06/28 0700 In: 1290 [I.V.:1090.2; IV Piggyback:199.9] Out: 1950 [Urine:850; Stool:750] Intake/Output this shift: No intake/output data recorded.  PE:  Wound as seen above, small amount of fibrinous exudate superiorly but wound otherwise granulating well   Lab Results:  Recent Labs    01/11/20 0924 01/12/20 0848  WBC 14.5* 11.6*  HGB 7.6* 8.6*  HCT 25.1* 28.0*  PLT 488* 474*   BMET Recent Labs    01/11/20 0343 01/11/20 1800 01/11/20 2002 01/12/20 0419  NA 147*  --   --  143  K 2.7*   < > 3.4* 3.2*  CL 115*  --   --  114*  CO2 27  --   --  26  GLUCOSE 109*  --   --  100*  BUN 10  --   --  6  CREATININE <0.30*  --   --  <0.30*  CALCIUM 7.8*  --   --  7.5*   < > = values in this interval not displayed.   PT/INR Recent Labs    01/11/20 0343  LABPROT 14.9  INR 1.2   CMP     Component Value Date/Time   NA 143 01/12/2020 0419   K 3.2 (L) 01/12/2020 0419   CL 114 (H) 01/12/2020 0419   CO2 26 01/12/2020 0419   GLUCOSE 100 (H) 01/12/2020 0419   BUN 6 01/12/2020 0419   CREATININE <0.30 (L) 01/12/2020 0419   CALCIUM 7.5 (L) 01/12/2020 0419   PROT 5.9 (L) 01/02/2020 0459   ALBUMIN 1.4 (L) 01/02/2020 0459   AST 39 01/02/2020 0459   ALT 21 01/02/2020 0459   ALKPHOS 117 01/02/2020 0459   BILITOT 1.0 01/02/2020 0459   GFRNONAA NOT CALCULATED 01/12/2020 0419   GFRAA NOT CALCULATED 01/12/2020 0419   Lipase  No  results found for: LIPASE     Studies/Results: CT CHEST WO CONTRAST  Result Date: 01/10/2020 CLINICAL DATA:  Shortness of breath, pleural effusion, abnormal chest x-ray EXAM: CT CHEST WITHOUT CONTRAST TECHNIQUE: Multidetector CT imaging of the chest was performed following the standard protocol without IV contrast. COMPARISON:  01/10/2020 FINDINGS: Cardiovascular: Unenhanced imaging of the heart demonstrates no pericardial effusion. Normal caliber of the thoracic aorta. No significant atherosclerosis. Left-sided PICC extends to the atrial caval junction. Mediastinum/Nodes: No enlarged mediastinal or axillary lymph nodes. Thyroid gland and esophagus are grossly unremarkable. There is circumferential narrowing of the subglottic airway, which could reflect mucosal edema or stenosis related previous intubation. Lungs/Pleura: Dependent atelectasis is identified within the lower lobes, left greater than right. There are trace bilateral pleural effusions, left greater than right. No acute airspace disease or pneumothorax. The described abnormality at the right hilum on prior chest x-ray likely reflects confluence of atelectasis and vascular shadows. Upper Abdomen: There is extensive body wall edema within the bilateral flanks. No acute intra-abdominal process. Musculoskeletal: No acute or destructive bony lesions. Reconstructed images demonstrate no additional findings. IMPRESSION: 1. Trace bilateral  pleural effusions, left greater than right, with bilateral dependent lower lobe atelectasis. 2. Circumferential narrowing of the subglottic airway, which could reflect mucosal edema or stenosis related to previous intubation. Electronically Signed   By: Randa Ngo M.D.   On: 01/10/2020 19:25    Anti-infectives: Anti-infectives (From admission, onward)   Start     Dose/Rate Route Frequency Ordered Stop   12/31/19 1600  metroNIDAZOLE (FLAGYL) IVPB 500 mg  Status:  Discontinued        500 mg 100 mL/hr over 60  Minutes Intravenous Every 8 hours 12/31/19 1505 01/06/20 0859   12/28/19 1400  vancomycin (VANCOCIN) IVPB 1000 mg/200 mL premix  Status:  Discontinued        1,000 mg 200 mL/hr over 60 Minutes Intravenous Every 24 hours 12/28/19 1005 12/28/19 1343   12/28/19 1400  linezolid (ZYVOX) IVPB 600 mg        600 mg 300 mL/hr over 60 Minutes Intravenous Every 12 hours 12/28/19 1343 01/06/20 1954   12/26/19 1948  vancomycin variable dose per unstable renal function (pharmacist dosing)  Status:  Discontinued         Does not apply See admin instructions 12/26/19 1948 12/28/19 1005   12/23/19 2000  vancomycin (VANCOCIN) IVPB 1000 mg/200 mL premix  Status:  Discontinued        1,000 mg 200 mL/hr over 60 Minutes Intravenous Every 8 hours 12/23/19 1027 12/26/19 1944   12/23/19 1300  clindamycin (CLEOCIN) IVPB 900 mg  Status:  Discontinued        900 mg 100 mL/hr over 30 Minutes Intravenous Every 8 hours 12/23/19 1205 12/31/19 1505   12/23/19 1200  metroNIDAZOLE (FLAGYL) IVPB 500 mg  Status:  Discontinued        500 mg 100 mL/hr over 60 Minutes Intravenous Every 8 hours 12/23/19 1135 12/23/19 1254   12/23/19 1045  vancomycin (VANCOREADY) IVPB 2000 mg/400 mL        2,000 mg 200 mL/hr over 120 Minutes Intravenous NOW 12/23/19 1018 12/23/19 1441   12/23/19 0300  cefTRIAXone (ROCEPHIN) 2 g in sodium chloride 0.9 % 100 mL IVPB  Status:  Discontinued        2 g 200 mL/hr over 30 Minutes Intravenous Every 24 hours 12/23/19 0236 01/06/20 0859       Assessment/Plan Acute hypoxic respiratory failure - now on nasal cannula Hepatic encephalopathy Alcohol abuse Thrombocytopenia  HTN Anemia of critical illness and ABL anemia - had some bleeding from wound and rectum over the weekend, s/p GI scope which showed a rectal ulcer, bleeding seems to have stopped now   - hgb 8.6 this AM from 7.6 yesterday  Morbid obesity - BMI 43.90  Necrotizing fasciitis of left buttock -S/Pincision and debridement of skin,  subcutaneous tissue, fascia and muscle 15x15x6cm, left buttock5/8 Dr. 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 Redmond Pulling - Ostomy functioning. WOCN following -Continue hydrotherapy MWF - added santyl to help with superior edge of wound - will see again later in the week    LOS: 22 days    Norm Parcel , [facility] 01/12/2020, 10:47 AM Please see Amion for pager number during day hours 7:00am-4:30pm

## 2020-01-13 ENCOUNTER — Inpatient Hospital Stay (HOSPITAL_COMMUNITY): Payer: Medicaid Other

## 2020-01-13 LAB — CBC
HCT: 22.4 % — ABNORMAL LOW (ref 36.0–46.0)
Hemoglobin: 6.8 g/dL — CL (ref 12.0–15.0)
MCH: 31.6 pg (ref 26.0–34.0)
MCHC: 30.4 g/dL (ref 30.0–36.0)
MCV: 104.2 fL — ABNORMAL HIGH (ref 80.0–100.0)
Platelets: 424 10*3/uL — ABNORMAL HIGH (ref 150–400)
RBC: 2.15 MIL/uL — ABNORMAL LOW (ref 3.87–5.11)
RDW: 24.5 % — ABNORMAL HIGH (ref 11.5–15.5)
WBC: 13 10*3/uL — ABNORMAL HIGH (ref 4.0–10.5)
nRBC: 0 % (ref 0.0–0.2)

## 2020-01-13 LAB — GLUCOSE, CAPILLARY
Glucose-Capillary: 111 mg/dL — ABNORMAL HIGH (ref 70–99)
Glucose-Capillary: 118 mg/dL — ABNORMAL HIGH (ref 70–99)
Glucose-Capillary: 125 mg/dL — ABNORMAL HIGH (ref 70–99)
Glucose-Capillary: 127 mg/dL — ABNORMAL HIGH (ref 70–99)
Glucose-Capillary: 138 mg/dL — ABNORMAL HIGH (ref 70–99)
Glucose-Capillary: 65 mg/dL — ABNORMAL LOW (ref 70–99)
Glucose-Capillary: 89 mg/dL (ref 70–99)

## 2020-01-13 LAB — BLOOD GAS, ARTERIAL
Acid-base deficit: 0.2 mmol/L (ref 0.0–2.0)
Bicarbonate: 23 mmol/L (ref 20.0–28.0)
Drawn by: 295031
FIO2: 28
O2 Content: 2 L/min
O2 Saturation: 98.9 %
Patient temperature: 98.6
pCO2 arterial: 34 mmHg (ref 32.0–48.0)
pH, Arterial: 7.446 (ref 7.350–7.450)
pO2, Arterial: 136 mmHg — ABNORMAL HIGH (ref 83.0–108.0)

## 2020-01-13 LAB — BASIC METABOLIC PANEL
Anion gap: 7 (ref 5–15)
BUN: 7 mg/dL (ref 6–20)
CO2: 23 mmol/L (ref 22–32)
Calcium: 7.6 mg/dL — ABNORMAL LOW (ref 8.9–10.3)
Chloride: 111 mmol/L (ref 98–111)
Creatinine, Ser: 0.32 mg/dL — ABNORMAL LOW (ref 0.44–1.00)
GFR calc Af Amer: >60 mL/min (ref >60)
GFR calc non Af Amer: >60 mL/min (ref >60)
Glucose, Bld: 132 mg/dL — ABNORMAL HIGH (ref 70–99)
Potassium: 3.8 mmol/L (ref 3.5–5.1)
Sodium: 141 mmol/L (ref 135–145)

## 2020-01-13 LAB — HEPATIC FUNCTION PANEL
ALT: 15 U/L (ref 0–44)
AST: 29 U/L (ref 15–41)
Albumin: 1.2 g/dL — ABNORMAL LOW (ref 3.5–5.0)
Alkaline Phosphatase: 127 U/L — ABNORMAL HIGH (ref 38–126)
Bilirubin, Direct: 0.2 mg/dL (ref 0.0–0.2)
Indirect Bilirubin: 0.4 mg/dL (ref 0.3–0.9)
Total Bilirubin: 0.6 mg/dL (ref 0.3–1.2)
Total Protein: 4.9 g/dL — ABNORMAL LOW (ref 6.5–8.1)

## 2020-01-13 LAB — PHOSPHORUS
Phosphorus: 3.6 mg/dL (ref 2.5–4.6)
Phosphorus: 3 mg/dL (ref 2.5–4.6)

## 2020-01-13 LAB — D-DIMER, QUANTITATIVE: D-Dimer, Quant: 3.23 ug/mL-FEU — ABNORMAL HIGH (ref 0.00–0.50)

## 2020-01-13 LAB — MAGNESIUM
Magnesium: 1.8 mg/dL (ref 1.7–2.4)
Magnesium: 1.7 mg/dL (ref 1.7–2.4)

## 2020-01-13 LAB — PREPARE RBC (CROSSMATCH)

## 2020-01-13 MED ORDER — VITAMIN C 500 MG/5ML PO SYRP
500.0000 mg | ORAL_SOLUTION | Freq: Two times a day (BID) | ORAL | Status: DC
  Administered 2020-01-13: 500 mg
  Administered 2020-01-14: 100 mg
  Administered 2020-01-14 – 2020-01-27 (×27): 500 mg
  Filled 2020-01-13 (×32): qty 5

## 2020-01-13 MED ORDER — MEGESTROL ACETATE 40 MG PO TABS
40.0000 mg | ORAL_TABLET | Freq: Every day | ORAL | Status: DC
  Administered 2020-01-14: 40 mg
  Filled 2020-01-13 (×2): qty 1

## 2020-01-13 MED ORDER — VITAMIN A 3 MG (10000 UNIT) PO CAPS: 20000.0000 [IU] | ORAL_CAPSULE | Freq: Every day | ORAL | Status: DC

## 2020-01-13 MED ORDER — VITAMIN A 3 MG (10000 UNIT) PO CAPS
20000.0000 [IU] | ORAL_CAPSULE | Freq: Every day | ORAL | Status: AC
  Administered 2020-01-15 – 2020-01-23 (×9): 20000 [IU] via ORAL
  Filled 2020-01-13 (×10): qty 2

## 2020-01-13 MED ORDER — ADULT MULTIVITAMIN W/MINERALS CH
1.0000 | ORAL_TABLET | Freq: Every day | ORAL | Status: DC
  Administered 2020-01-14 – 2020-01-27 (×14): 1
  Filled 2020-01-13 (×14): qty 1

## 2020-01-13 MED ORDER — MESALAMINE 1000 MG RE SUPP
500.0000 mg | Freq: Every day | RECTAL | Status: AC
  Administered 2020-01-13 – 2020-01-20 (×7): 500 mg via RECTAL
  Filled 2020-01-13 (×7): qty 1

## 2020-01-13 MED ORDER — FOLIC ACID 1 MG PO TABS
1.0000 mg | ORAL_TABLET | Freq: Every day | ORAL | Status: DC
  Administered 2020-01-14 – 2020-01-27 (×14): 1 mg
  Filled 2020-01-13 (×14): qty 1

## 2020-01-13 MED ORDER — CHOLECALCIFEROL 10 MCG/ML (400 UNIT/ML) PO LIQD
2000.0000 [IU] | Freq: Every day | ORAL | Status: DC
  Filled 2020-01-13: qty 5

## 2020-01-13 MED ORDER — THIAMINE HCL 100 MG PO TABS
100.0000 mg | ORAL_TABLET | Freq: Every day | ORAL | Status: DC
  Administered 2020-01-14 – 2020-01-27 (×14): 100 mg
  Filled 2020-01-13 (×14): qty 1

## 2020-01-13 MED ORDER — ERGOCALCIFEROL 200 MCG/ML PO SOLN
2000.0000 [IU] | Freq: Every day | ORAL | Status: DC
  Administered 2020-01-14: 2000 [IU]
  Filled 2020-01-13 (×3): qty 0.25

## 2020-01-13 MED ORDER — PANTOPRAZOLE SODIUM 40 MG PO PACK
40.0000 mg | PACK | Freq: Two times a day (BID) | ORAL | Status: DC
  Administered 2020-01-13 – 2020-01-28 (×29): 40 mg
  Filled 2020-01-13 (×32): qty 20

## 2020-01-13 MED ORDER — IOHEXOL 350 MG/ML SOLN
100.0000 mL | Freq: Once | INTRAVENOUS | Status: AC | PRN
  Administered 2020-01-13: 80 mL via INTRAVENOUS

## 2020-01-13 MED ORDER — SODIUM CHLORIDE 0.9% IV SOLUTION: Freq: Once | INTRAVENOUS | Status: DC

## 2020-01-13 NOTE — Progress Notes (Signed)
PROGRESS NOTE    Alyssa Carey  WUJ:[id number] DOB: 1971/03/17 DOA: 12/21/2019 PCP: Patient, No Pcp Per   Brief Narrative: Alyssa Carey is a 49 y.o. female that presented secondary to concern for alcohol withdrawal and quickly developed evidence of necrotizing fasciitis.  This was emergently managed in the OR by general surgery via I&D. Patient required intubation, vasopressor support and ICU admission for septic shock. She underwent repeat I&D and diverting colostomy. She has now been extubate and is off vasopressors. Hydrotherapy started.    ICU significant events: 6/06 admission 6/08 ICU admission to OR ofr I&D, back intubated and on pressors, 6/09 started tube feeds. Still pressor dependent, kept on vent w/ anticipated return to OR planned  6/10 Back to the OR for significant debridement and also placement of diverting colostomy 6/11 Sedated, low-dose phenylephrine. CCS felt wound looking good.  Sedation changed to precedex / PRN fent  6/12 more awake, on 15/5 pressure support 6/13 family discussion >> DNR, continue medical care 6//15 transfuse 1 unit PRBC; vomiting, tube feeds held 6/16 Vomiting overnight 6/17 Trial PSV 15/5 with variable Vt, remains on vasopressors.  Vomiting overnight  6/18 transfuse 1 unit PRBC 6/20 Off pressors,  On precedex and weaning on PSV  6/21 Weaning on PSV, planned hydrotherapy 6/23 Extubated 6/24 Transferred off of ICU service   Assessment & Plan:   Principal Problem:   Necrotizing fasciitis of pelvic region and thigh [facility]) Active Problems:   Fall at home, initial encounter   Weakness   Alcoholism ([place])   Thrombocytopenia ([place])   ARF (acute renal failure) ([place])   Hepatic encephalopathy ([place])   Alcoholic liver disease ([place])   Pressure injury of skin   Elevated LFTs   Acute respiratory failure with hypoxemia ([place])   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  General surgery was consulted and patient was emergently sent to the OR for incision and debridement.  Antibiotics as mentioned below.  Patient underwent repeat incision and drainage in addition to diverting loop colostomy to aid in healing of the wound.  Patient has now started hydrotherapy. Recurrent bleeding from wound vs rectum.  Septic shock Secondary to necrotizing fasciitis.  Patient required ICU admission, intubation, vasopressor support.  Patient was initially managed on vancomycin, clindamycin, ceftriaxone.  Vancomycin was eventually transitioned to linezolid and clindamycin transition to Flagyl.  Patient was treated with antibiotics from 6/7 until 6/22. Culture data from wound significant for multiple organisms: E. coli, vancomycin resistant Enterococcus  Subglottic airway narrowing Circumferential. Seen incidentally on CT scan. ENT consulted and performed flexible laryngoscopy with evidence of some swelling of anterior subglottis and no obvious obstruction.  Aphonia Right vocal cord paralysis ENT consulted and performed flexible laryngoscopy significant for evidence of right sided vocal cord paralysis. Likely secondary to prolonged intubation. Associated left sided vocal cord paresis. Will likely improved in several weeks.  Acute respiratory failure with hypoxia Patient required mechanical ventilation from 6/8 until 6/23.  Patient now requiring 2 L via nasal cannula. Still with some associated tachypnea. -Continue to wean to room air as able  Hypernatremia Secondary to poor free water intake.  Urine sodium osmolality obtain which was significant for an appropriately high urine osmolality of  706. Resolved. -Discontinue IV fluids  Anasarca Secondary to poor nutrition  and resultant hypoalbuminemia in addition to IV fluids. Albumin of 1.4.  -Nutrition management.  Malnutrition Not present on admission. Secondary to critical illness.  Patient was initially on tube feeds while intubated.  OG tube was removed.  Speech therapy has evaluated at bedside in addition to modified barium swallow and recommended a dysphagia 1 diet. Albumin of 1.4. Prealbumin is undetectable at less than 5. Now NPO with insertion of NG tube on 6/28 for tube feeds -NPO -Continue tube feeds per dietitian recommendations  Anemia of critical illness Acute blood loss anemia Baseline hemoglobin of 12-13.  Patient developed anemia in setting of severe infection/wound in addition to multiple surgical interventions.  Patient has received 3 units of PRBC to date.  Hemoglobin currently stable.  Bleeding from wound overnight (6/26). Acute drop of hemoglobin to 6.9. Improvement of hemoglobin to 7.1 post transfusion and stable. Initially thought to have wound hemorrhage, however it appears patient is having rectal hemorrhage. Hemoglobin stable. Subcutaneous heparin held on 6/26 -General surgery: rectal bleeding, GI consulted and performed bedside flexible sigmoidoscopy significant for stercoral ulcer. -Daily CBC -Blood transfusion as needed  Stercoral ulcer Diagnosed on flexible sigmoidoscopy (6/28). 10 mm ulcer in distal rectum. -GI recommendations: mesalamine suppository per rectum x7-10 days  Thrombocytopenia Present early in admission.  In setting of critical illness in addition to alcohol use and liver disease.  Patient received 2 units of platelets.  Currently resolved.  Hypokalemia Recurrent issue. Normal magnesium.  -IV fluids with potassium  Hyperglycemia Hemoglobin A1c of 5.7.  Patient meets criteria for prediabetes.  Recommend diet modification for treatment.  Could consider metformin as well as an outpatient.  With patient's current weight, high likelihood weight loss could prevent  the need for any medication management.   Alcoholic hepatitis Hepatic encephalopathy Present on admission.  Now resolved.  Was previously on lactulose which has been held in light of current buttock wound.  AKI Patient with unknown baseline.  Patient with a creatinine of 1.74 on admission.  AKI currently resolved.  Morbid obesity Body mass index is 44.09 kg/m.  Pressure injury Deep tissue injury of right ear, not present on admission   DVT prophylaxis: SCDs Code Status:   Code Status: Full Code Family Communication: None at bedside. Disposition Plan: Anticipate patient will likely discharge to LTAC (consult to [facility] placed).   Patient will be receiving continued hydrotherapy per PT.  Pending management of GI/wound bleeding, stabilization of respiratory status and tachycardia.   Consultants:   PCCM  General surgery  Gastroenterology ([place])  Procedures:   ENDOTRACHEAL INTUBATION (6/8 >> 01/07/2020)  INCISION AND DEBRIDEMENT OF LEFT BUTTOCK AND PERINEUM (12/23/2019)  IRRIGATION AND DEBRIDEMENT OF BUTTOCKS; LAPAROSCOPIC LOOP COLOSTOMY (12/25/2019)  FLEXIBLE SIGMOIDOSCOPY (01/11/2020) Impression:               - A single (solitary) ulcer in the rectum.                           - No specimens collected.  Recommendation:           - Continue with the current supportive care.                           - No specific recommendations given the patient's  overall condition and findings. There was no                            evidence of any solid stool that is the typical                            source to induce a stercoral ulcer.   FLEXIBLE LARYNGOSCOPY (01/12/2020) The right nasal cavity was treated with topical oxymetazoline spray and 4% Xylocaine solution.  The flexible endoscope was passed through the right nasal cavity into the nasopharynx.  The nasopharynx was clear.  The oropharynx and hypopharynx were clear as well.  There are no masses or  mucosal lesions identified.  The endolarynx does not reveal any mucosal lesions.  There may be some swelling of the subglottis anteriorly but no obvious obstruction.  The right cord is barely moving at all.  The left side is paretic.  With forced coughing she is able to close her glottis reasonably tightly.  There is no pooling of secretions.   Antimicrobials:  Ceftriaxone  Vancomycin  Clindamycin  Linezolid  Flagyl   Subjective: No issues overnight. Somnolent this morning.  Objective: Vitals:   01/13/20 1100 01/13/20 1156 01/13/20 1200 01/13/20 1300  BP: 109/89  121/77   Pulse: (!) 110  (!) 109 (!) 123  Resp: (!) 27  (!) 25 (!) 27  Temp:  99.7 F (37.6 C)    TempSrc:  Axillary    SpO2: 100%  100% 100%  Weight:      Height:        Intake/Output Summary (Last 24 hours) at 01/13/2020 1334 Last data filed at 01/13/2020 1329 Gross per 24 hour  Intake 3468.34 ml  Output 1910 ml  Net 1558.34 ml   Filed Weights   01/11/20 0433 01/12/20 0500 01/13/20 0500  Weight: 114.1 kg 116 kg 116.5 kg    Examination:  General exam: Appears calm and comfortable Respiratory system: Mild rhonchi but mostly clear. Some tachypnea Cardiovascular system: S1 & S2 heard, Tachycardia, normal rhythm. No murmurs, rubs, gallops or clicks. Gastrointestinal system: Abdomen is nondistended, soft and nontender. No organomegaly or masses felt. Normal bowel sounds heard. Central nervous system: Somnolent Musculoskeletal: Upper/Lower extremity edema. Skin: No cyanosis. No rashes   Data Reviewed: I have personally reviewed following labs and imaging studies  CBC Lab Results  Component Value Date   WBC 11.6 (H) 01/12/2020   RBC 2.73 (L) 01/12/2020   HGB 8.6 (L) 01/12/2020   HCT 28.0 (L) 01/12/2020   MCV 102.6 (H) 01/12/2020   MCH 31.5 01/12/2020   PLT 474 (H) 01/12/2020   MCHC 30.7 01/12/2020   RDW 26.9 (H) 01/12/2020   LYMPHSABS 3.4 12/28/2019   MONOABS 0.8 12/28/2019   EOSABS 0.2  12/28/2019   BASOSABS 0.1 37/90/2409     Last metabolic panel Lab Results  Component Value Date   NA 141 01/13/2020   K 3.8 01/13/2020   CL 111 01/13/2020   CO2 23 01/13/2020   BUN 7 01/13/2020   CREATININE 0.32 (L) 01/13/2020   GLUCOSE 132 (H) 01/13/2020   GFRNONAA >60 01/13/2020   GFRAA >60 01/13/2020   CALCIUM 7.6 (L) 01/13/2020   PHOS 3.6 01/13/2020   PROT 4.9 (L) 01/13/2020   ALBUMIN 1.2 (L) 01/13/2020   BILITOT 0.6 01/13/2020   ALKPHOS 127 (H) 01/13/2020   AST 29 01/13/2020   ALT 15 01/13/2020  ANIONGAP 7 01/13/2020    CBG (last 3)  Recent Labs    01/13/20 0351 01/13/20 0741 01/13/20 1130  GLUCAP 127* 138* 118*     GFR: Estimated Creatinine Clearance: 110.1 mL/min (A) (by C-G formula based on SCr of 0.32 mg/dL (L)).  Coagulation Profile: Recent Labs  Lab 01/11/20 0343  INR 1.2    No results found for this or any previous visit (from the past 240 hour(s)).      Radiology Studies: DG Abd 1 View  Result Date: 01/12/2020 CLINICAL DATA:  Feeding tube placement. EXAM: ABDOMEN - 1 VIEW COMPARISON:  January 05, 2020. FINDINGS: The bowel gas pattern is normal. Distal tip of feeding tube is seen in expected position of distal stomach or proximal duodenum. No radio-opaque calculi or other significant radiographic abnormality are seen. IMPRESSION: Distal tip of feeding tube seen in expected position of distal stomach or proximal duodenum. Electronically Signed   By: Marijo Conception M.D.   On: 01/12/2020 12:38   DG CHEST PORT 1 VIEW  Result Date: 01/12/2020 CLINICAL DATA:  Acute respiratory failure with hypoxia. EXAM: PORTABLE CHEST 1 VIEW COMPARISON:  01/10/2020 FINDINGS: The left PICC line is in good position, unchanged. Stable cardiac enlargement and prominent mediastinal contours. Central vascular congestion and possible perihilar pulmonary edema. No large pleural effusions. Bibasilar atelectasis is noted. IMPRESSION: 1. Stable cardiac enlargement, vascular  congestion and possible perihilar pulmonary edema. 2. Bibasilar atelectasis. Electronically Signed   By: Marijo Sanes M.D.   On: 01/12/2020 11:40        Scheduled Meds: . ascorbic acid  500 mg Per Tube BID  . chlorhexidine  15 mL Mouth Rinse BID  . Chlorhexidine Gluconate Cloth  6 each Topical Daily  . collagenase   Topical Daily  . ergocalciferol  2,000 Units Per Tube Daily  . feeding supplement (PIVOT 1.5 CAL)  1,000 mL Per Tube Q24H  . feeding supplement (PRO-STAT SUGAR FREE 64)  30 mL Per Tube Daily  . ferrous sulfate  300 mg Per Tube BID WC  . [START ON 6/80/3212] folic acid  1 mg Per Tube Daily  . insulin aspart  0-6 Units Subcutaneous Q4H  . insulin aspart  4 Units Subcutaneous Q4H  . mouth rinse  15 mL Mouth Rinse q12n4p  . [START ON 01/14/2020] multivitamin with minerals  1 tablet Per Tube Daily  . pantoprazole sodium  40 mg Per Tube BID  . sodium chloride flush  10-40 mL Intracatheter Q12H  . [START ON 01/14/2020] thiamine  100 mg Per Tube Daily  . [START ON 01/14/2020] vitamin A  20,000 Units Per Tube Daily  . zinc sulfate  220 mg Oral Daily   Continuous Infusions: . sodium chloride       LOS: 23 days     Cordelia Poche, MD [facility] 01/13/2020, 1:34 PM  If 7PM-7AM, please contact night-coverage [email or web address]

## 2020-01-13 NOTE — Progress Notes (Signed)
Physical Therapy Treatment Patient Details Name: Alyssa Carey MRN: [id number] DOB: September 17, 1970 Today's Date: 01/13/2020    History of Present Illness 49 y/o female with a heavy alcohol history admitted6/8/21  in the setting of confusion and presumed alcohol withdrawal who later developed sepsis from a gas forming wound, S/P debridement left gluteal wound for necratizing fascitis.VDRF,    PT Comments    The patient is awake and follows simple commands, nods head to simple questions. Patient to get ABG drawn so will tilt bed later as tolerated.  Follow Up Recommendations  SNF     Equipment Recommendations    tba   Recommendations for Other Services       Precautions / Restrictions Precautions Precautions: Fall Precaution Comments: left gluteal wound, L colostomy, R abd drain from Lap incision-DRAIN BOTH BEFORE ROLLING    Mobility  Bed Mobility     Rolling: +2 for physical assistance;Total assist         General bed mobility comments: reaches for rail, requires assistance  for legs. repositioned  in bed. to get ABG drawn  Transfers                    Ambulation/Gait                 Stairs             Wheelchair Mobility    Modified Rankin (Stroke Patients Only)       Balance                                            Cognition Arousal/Alertness: Awake/alert   Overall Cognitive Status: Difficult to assess                                 General Comments: followed  directions to reach for rail      Exercises      General Comments        Pertinent Vitals/Pain Pain Assessment: No/denies pain    Home Living                      Prior Function            PT Goals (current goals can now be found in the care plan section) Progress towards PT goals: Progressing toward goals    Frequency    Min 2X/week      PT Plan Current plan remains appropriate    Co-evaluation               AM-PAC PT "6 Clicks" Mobility   Outcome Measure  Help needed turning from your back to your side while in a flat bed without using bedrails?: Total Help needed moving from lying on your back to sitting on the side of a flat bed without using bedrails?: Total Help needed moving to and from a bed to a chair (including a wheelchair)?: Total Help needed standing up from a chair using your arms (e.g., wheelchair or bedside chair)?: Total Help needed to walk in hospital room?: Total Help needed climbing 3-5 steps with a railing? : Total 6 Click Score: 6    End of Session   Activity Tolerance: Patient tolerated treatment well Patient left: in bed;with call bell/phone within reach Nurse Communication: Mobility  status;Need for lift equipment PT Visit Diagnosis: Muscle weakness (generalized) (M62.81)     Time: 3536-1443 PT Time Calculation (min) (ACUTE ONLY): 24 min  Charges:  $Therapeutic Activity: 23-37 mins                     Tresa Endo PT Acute Rehabilitation Services Pager [phone] Office [phone]    Claretha Cooper 01/13/2020, 4:26 PM

## 2020-01-13 NOTE — Progress Notes (Signed)
CRITICAL VALUE ALERT  Critical Value: Hgb 6.8  Date & Time Notied:  01/13/20, 1750  Provider Notified: Georgena Spurling MD  Orders Received/Actions taken: 1 U PRBCs, D-dimer lab work

## 2020-01-13 NOTE — Progress Notes (Signed)
Physical Therapy Treatment Patient Details Name: Alyssa Carey MRN: [id number] DOB: 19-Feb-1971 Today's Date: 01/13/2020    History of Present Illness 49 y/o female with a heavy alcohol history admitted6/8/21  in the setting of confusion and presumed alcohol withdrawal who later developed sepsis from a gas forming wound, S/P debridement left gluteal wound for necratizing fascitis.VDRF,    PT Comments    The patient positioned for tilting bed with straps. Patient placed at 42 degrees x 23 minutes. BP 110/65, RR max 34,  !00% on 2 L [place], HR 110-134. Patient initiated UE exercises while in standing. Patient tolerated very well tilt standing. Continue mobility.   Follow Up Recommendations  SNF     Equipment Recommendations    TBA   Recommendations for Other Services       Precautions / Restrictions Precautions Precautions: Fall Precaution Comments: left gluteal wound, L colostomy, R abd drain from Lap incision-DRAIN BOTH BEFORE ROLLING    Mobility  Bed Mobility     Rolling: +2 for physical assistance;Total assist         General bed mobility comments: Tilt bed position used to stand at 42* x 23 minutes.  Transfers                    Ambulation/Gait                 Stairs             Wheelchair Mobility    Modified Rankin (Stroke Patients Only)       Balance       Sitting balance - Comments: holdas head in midline                                    Cognition Arousal/Alertness: Awake/alert Behavior During Therapy: WFL for tasks assessed/performed;Flat affect Overall Cognitive Status: Difficult to assess                                 General Comments: patient initiated elbow flexion exercises while in standing, nodding to simple questions      Exercises      General Comments        Pertinent Vitals/Pain Pain Assessment: No/denies pain Faces Pain Scale: Hurts little more Pain Location: legs  after standing x 23" Pain Descriptors / Indicators: Discomfort Pain Intervention(s): Monitored during session    Home Living                      Prior Function            PT Goals (current goals can now be found in the care plan section) Progress towards PT goals: Progressing toward goals    Frequency    Min 2X/week      PT Plan Current plan remains appropriate    Co-evaluation              AM-PAC PT "6 Clicks" Mobility   Outcome Measure  Help needed turning from your back to your side while in a flat bed without using bedrails?: Total Help needed moving from lying on your back to sitting on the side of a flat bed without using bedrails?: Total Help needed moving to and from a bed to a chair (including a wheelchair)?: Total Help needed standing up from a chair using  your arms (e.g., wheelchair or bedside chair)?: Total Help needed to walk in hospital room?: Total Help needed climbing 3-5 steps with a railing? : Total 6 Click Score: 6    End of Session Equipment Utilized During Treatment:  (tilt bed) Activity Tolerance: Patient tolerated treatment well Patient left: in bed;with call bell/phone within reach Nurse Communication: Mobility status;Need for lift equipment PT Visit Diagnosis: Muscle weakness (generalized) (M62.81)     Time: 1419-1500 PT Time Calculation (min) (ACUTE ONLY): 41 min  Charges:  $Therapeutic Activity: 38-52 mins                     Tresa Endo PT Acute Rehabilitation Services Pager [phone] Office [phone]    Claretha Cooper 01/13/2020, 4:33 PM

## 2020-01-13 NOTE — TOC Progression Note (Addendum)
Transition of Care [facility]) - Progression Note    Patient Details  Name: Alyssa Carey MRN: [id number] Date of Birth: 29-Aug-1970  [facility] CM/SW Contact  Leeroy Cha, RN Phone Number: 01/13/2020, 10:02 AM  Clinical Narrative:    Referral to ltac's in the area done. Patient does not have a payor for ltach.  Family is working on getting mediciad but Medicaid has not ltach benefits.  Expected Discharge Plan: [place] Barriers to Discharge: Continued Medical Work up  Expected Discharge Plan and Services Expected Discharge Plan: [place] In-house Referral: Development worker, community                                             Social Determinants of Health (SDOH) Interventions    Readmission Risk Interventions No flowsheet data found.

## 2020-01-13 NOTE — Progress Notes (Signed)
SLP Cancellation Note  Patient Details Name: Alyssa Carey MRN: [id number] DOB: Nov 27, 1970   Cancelled treatment:       Reason Eval/Treat Not Completed: Other (comment) (pt sound asleep when SLP went by to follow up, has tube feeding running, will continue efforts)  Kathleen Lime, MS [place] Office [phone] Time of attempted visit was [place], [place] 01/13/2020, 5:33 PM

## 2020-01-13 NOTE — Progress Notes (Signed)
Continuing to have high volume output from lap site on right side lower abdomen. Rectal pouch on site with 600-800 mls out per shift. Drainage clear and yellow in color.

## 2020-01-13 NOTE — Progress Notes (Signed)
Patient continuing to have clots and bloody output on bed pad. Blood does not appear to be from wound as the drainage is on the outside of dressings and not on inner packing. Blood pooled in rectum and vagina. Blood is gelatinous and appears to have no stool in it. MD made aware, plan to redraw CBC. Patient states she hasn't menstruated in approximately a year and has an IUD. Patient has known ulcer within rectum per sigmoidoscopy. Patient has no sensation of having a bowel movement when bleeding occurs. No odor noted with output.

## 2020-01-13 NOTE — Consult Note (Signed)
[place] Nurse ostomy follow up Stoma type/location: midline lower quadrant Stomal assessment/size: oval, below skin level, pink, moist Peristomal assessment: mild denudation circumferential  Treatment options for stomal/peristomal skin: crusted skin with stoma powder Output liquid yellow  Ostomy pouching: 1pc. Soft convex Education provided: NA, patient not cognitively able to participate.  Enrolled patient in [place] Start Discharge program: No  [facility] Nurse wound follow up Wound type: unstageable pressure injury Measurement: 1.5cm x 0.2cm x 0 Wound YVG:CYOY is sloughing off but intact under  Drainage (amount, consistency, odor) none Periwound: intact  Dressing procedure/placement/frequency: Continue silicone foam;    [place] Nurse team will follow with you and see patient within 10 days for wound assessments.  Please notify [place] nurses of any acute changes in the wounds or any new areas of concern [place] MSN, [place], [place], [place]

## 2020-01-14 ENCOUNTER — Inpatient Hospital Stay (HOSPITAL_COMMUNITY): Payer: Medicaid Other

## 2020-01-14 DIAGNOSIS — M7989 Other specified soft tissue disorders: Secondary | ICD-10-CM

## 2020-01-14 LAB — CBC WITH DIFFERENTIAL/PLATELET
Abs Immature Granulocytes: 0.09 10*3/uL — ABNORMAL HIGH (ref 0.00–0.07)
Basophils Absolute: 0 10*3/uL (ref 0.0–0.1)
Basophils Relative: 0 %
Eosinophils Absolute: 0.2 10*3/uL (ref 0.0–0.5)
Eosinophils Relative: 1 %
HCT: 27.9 % — ABNORMAL LOW (ref 36.0–46.0)
Hemoglobin: 8.6 g/dL — ABNORMAL LOW (ref 12.0–15.0)
Immature Granulocytes: 1 %
Lymphocytes Relative: 12 %
Lymphs Abs: 1.6 10*3/uL (ref 0.7–4.0)
MCH: 29.5 pg (ref 26.0–34.0)
MCHC: 30.8 g/dL (ref 30.0–36.0)
MCV: 95.5 fL (ref 80.0–100.0)
Monocytes Absolute: 1 10*3/uL (ref 0.1–1.0)
Monocytes Relative: 7 %
Neutro Abs: 10.8 10*3/uL — ABNORMAL HIGH (ref 1.7–7.7)
Neutrophils Relative %: 79 %
Platelets: 398 10*3/uL (ref 150–400)
RBC: 2.92 MIL/uL — ABNORMAL LOW (ref 3.87–5.11)
RDW: 28 % — ABNORMAL HIGH (ref 11.5–15.5)
WBC: 13.7 10*3/uL — ABNORMAL HIGH (ref 4.0–10.5)
nRBC: 0 % (ref 0.0–0.2)

## 2020-01-14 LAB — TYPE AND SCREEN
ABO/RH(D): A POS
Antibody Screen: NEGATIVE
Unit division: 0

## 2020-01-14 LAB — BPAM RBC
Blood Product Expiration Date: 202107142359
ISSUE DATE / TIME: 202106292020
Unit Type and Rh: 6200

## 2020-01-14 LAB — GLUCOSE, CAPILLARY
Glucose-Capillary: 119 mg/dL — ABNORMAL HIGH (ref 70–99)
Glucose-Capillary: 122 mg/dL — ABNORMAL HIGH (ref 70–99)
Glucose-Capillary: 122 mg/dL — ABNORMAL HIGH (ref 70–99)
Glucose-Capillary: 123 mg/dL — ABNORMAL HIGH (ref 70–99)
Glucose-Capillary: 128 mg/dL — ABNORMAL HIGH (ref 70–99)
Glucose-Capillary: 137 mg/dL — ABNORMAL HIGH (ref 70–99)

## 2020-01-14 LAB — BASIC METABOLIC PANEL
Anion gap: 4 — ABNORMAL LOW (ref 5–15)
BUN: 9 mg/dL (ref 6–20)
CO2: 26 mmol/L (ref 22–32)
Calcium: 7.6 mg/dL — ABNORMAL LOW (ref 8.9–10.3)
Chloride: 109 mmol/L (ref 98–111)
Creatinine, Ser: <0.3 mg/dL — ABNORMAL LOW (ref 0.44–1.00)
Glucose, Bld: 139 mg/dL — ABNORMAL HIGH (ref 70–99)
Potassium: 3.5 mmol/L (ref 3.5–5.1)
Sodium: 139 mmol/L (ref 135–145)

## 2020-01-14 MED ORDER — METOPROLOL TARTRATE 5 MG/5ML IV SOLN
5.0000 mg | Freq: Four times a day (QID) | INTRAVENOUS | Status: DC | PRN
  Administered 2020-01-14 – 2020-01-20 (×2): 5 mg via INTRAVENOUS
  Filled 2020-01-14 (×2): qty 5

## 2020-01-14 MED ORDER — OXYCODONE HCL 5 MG PO TABS
5.0000 mg | ORAL_TABLET | ORAL | Status: DC | PRN
  Administered 2020-01-15 – 2020-01-27 (×19): 5 mg via ORAL
  Filled 2020-01-14 (×19): qty 1

## 2020-01-14 NOTE — Progress Notes (Signed)
PT Cancellation Note  Patient Details Name: CHANCEY CULLINANE MRN: [id number] DOB: April 25, 1971   Cancelled Treatment:    Reason Eval/Treat Not Completed: Medical issues which prohibited therapy (RLE painful, red (from just below knee to ankle), swollen, pain with passive dorsiflexion of ankle. RN notified and will text MD. Will follow.)  Philomena Doheny PT 01/14/2020  Acute Rehabilitation Services Pager [phone] Office [phone]

## 2020-01-14 NOTE — Progress Notes (Signed)
PROGRESS NOTE    Alyssa Carey  MEQ:[id number] DOB: 08/08/1970 DOA: 12/21/2019 PCP: Patient, No Pcp Per   Brief Narrative: Alyssa Carey is a 49 y.o. female that presented secondary to concern for alcohol withdrawal and quickly developed evidence of necrotizing fasciitis.  This was emergently managed in the OR by general surgery via I&D. Patient required intubation, vasopressor support and ICU admission for septic shock. She underwent repeat I&D and diverting colostomy. She has now been extubate and is off vasopressors. Hydrotherapy started.  Remains critically ill with multiple medical issues.  ICU significant events: 6/06 admission 6/08 ICU admission to OR ofr I&D, back intubated and on pressors, 6/09 started tube feeds. Still pressor dependent, kept on vent w/ anticipated return to OR planned  6/10 Back to the OR for significant debridement and also placement of diverting colostomy 6/11 Sedated, low-dose phenylephrine. CCS felt wound looking good.  Sedation changed to precedex / PRN fent  6/12 more awake, on 15/5 pressure support 6/13 family discussion >> DNR, continue medical care 6//15 transfuse 1 unit PRBC; vomiting, tube feeds held 6/16 Vomiting overnight 6/17 Trial PSV 15/5 with variable Vt, remains on vasopressors.  Vomiting overnight  6/18 transfuse 1 unit PRBC 6/20 Off pressors,  On precedex and weaning on PSV  6/21 Weaning on PSV, planned hydrotherapy 6/23 Extubated 6/24 Transferred to [facility] service.  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  Treated with different antibiotics.  Diverting loop colostomy and Foley catheter to aid in healing of her wound.  Currently receiving hydrotherapy. Surgery is following.  They said wound is healing.  Has recurrent bleeding from the wound area that has been stopped today.  Septic shock: Secondary to necrotizing fasciitis.  Patient required ICU admission, intubation, vasopressor support.  Patient was initially managed on vancomycin, clindamycin, ceftriaxone.  Vancomycin was eventually transitioned to linezolid and clindamycin transition to Flagyl.  Patient was treated with antibiotics from 6/7 until 6/22. Culture data from wound significant for multiple organisms: E. coli, vancomycin resistant Enterococcus.  Finished antibiotic therapy.  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 Seen incidentally on CT scan. ENT consulted and performed flexible laryngoscopy with evidence of some swelling of anterior subglottis and no obvious obstruction.  Continue recovery.  Continue speech therapy.  Acute respiratory failure with hypoxia:Patient required mechanical ventilation from 6/8 until 6/23.  Went to room air now.   Hypernatremia: Resolved. Secondary to poor free water intake.  Urine sodium osmolality obtain which was significant for an appropriately high urine osmolality of 706. Resolved. -Discontinue IV fluids  Anasarca/severe protein calorie malnutrition: Secondary to poor nutrition and resultant hypoalbuminemia in addition to IV fluids. Albumin of 1.4.  -Nutrition management. Due to significant malnutrition and unable to maintain oral intake, currently on NG tube feeding.  Continue NG tube feeding. Seen  by speech today, did well on swallow evaluation, started on  diet. We will continue to recommend NG tube feeding until she has good oral intake to maintain her calories.  Anemia of critical illness/ Acute blood loss anemia Baseline hemoglobin of 12-13.  Patient developed anemia in setting of severe infection/wound in addition to multiple surgical interventions.  Patient has received 3 units of PRBC to date.  Hemoglobin currently stable.  Bleeding from wound overnight (6/26). Acute drop of hemoglobin to 6.9. Improvement of hemoglobin to 7.1 post transfusion and stable. Initially thought to have wound hemorrhage, however it appears patient is having rectal hemorrhage. Hemoglobin stable. Subcutaneous heparin held on 6/26 -General surgery: rectal bleeding, GI consulted and performed bedside flexible sigmoidoscopy significant for stercoral ulcer. -Daily CBC -Blood transfusion as needed for hemoglobin less than 7. -Mesalamine suppository for 10 days. -Flexible sigmoidoscopy 6/28 consistent with 10 mm ulcer in the distal rectum consistent to a sterocoral ulcer.  Acute kidney injury: Patient with unknown baseline.  Patient with a creatinine of 1.74 on admission.  AKI currently resolved.  Morbid obesity with malnutrition Body mass index is 44.65 kg/m.  Pressure injury:Deep tissue injury of right ear, not present on admission, barrier dressing.  Tachycardia: Sinus tachycardia.  Adequate pain medications.  Transfusion blood to keep hemoglobin more than 8. Will accept degree of sinus tachycardia given multiple issues. Metoprolol as needed. Start oral pain medications and also continue as needed fentanyl. CTA of the chest was negative for PE. Right lower leg is inflamed, duplexes were negative for PE.  Will need very close monitoring especially given her development of necrotizing fasciitis.   DVT prophylaxis: SCDs Code Status:   Code Status: Full Code Family Communication: None at bedside. Disposition Plan: Anticipate patient will likely discharge to LTAC  (consult to [facility] placed).   Patient will be receiving continued hydrotherapy per PT.  Pending management of GI/wound bleeding, stabilization of respiratory status and tachycardia.   Consultants:   PCCM  General surgery  Gastroenterology ([place])  Procedures:   ENDOTRACHEAL INTUBATION (6/8 >> 01/07/2020)  INCISION AND DEBRIDEMENT OF LEFT BUTTOCK AND PERINEUM (12/23/2019)  IRRIGATION AND DEBRIDEMENT OF BUTTOCKS; LAPAROSCOPIC LOOP COLOSTOMY (12/25/2019)  FLEXIBLE SIGMOIDOSCOPY (01/11/2020) Impression:               - A single (solitary) ulcer in the rectum.                           - No specimens collected.  Recommendation:           - Continue with the current supportive care.                           - No specific recommendations given the patient's                            overall condition and findings. There was no                            evidence of any solid stool that is the typical                            source to induce a stercoral ulcer.   FLEXIBLE LARYNGOSCOPY (01/12/2020) The right nasal cavity was treated with topical oxymetazoline spray  and 4% Xylocaine solution.  The flexible endoscope was passed through the right nasal cavity into the nasopharynx.  The nasopharynx was clear.  The oropharynx and hypopharynx were clear as well.  There are no masses or mucosal lesions identified.  The endolarynx does not reveal any mucosal lesions.  There may be some swelling of the subglottis anteriorly but no obvious obstruction.  The right cord is barely moving at all.  The left side is paretic.  With forced coughing she is able to close her glottis reasonably tightly.  There is no pooling of secretions.   Antimicrobials:  Ceftriaxone  Vancomycin  Clindamycin  Linezolid  Flagyl  Finished all antibiotic therapy.   Subjective: Patient was seen and examined.  No overnight events.  Patient herself denies any complaints.  She was able to talk with whispering voice.  She  was able to swallow well and is started on oral diet.  Complains of pain on the right calf which is more than usual. Telemetry shows sinus tachycardia heart rate as high as 155.  Patient denies any pain.  Denies any chest pain or palpitations. Patient reported redness and erythema of the right calf, I discussed with surgery and they said this is not infectious. Duplex ordered, reviewed, no evidence of DVT.  Objective: Vitals:   01/14/20 1000 01/14/20 1100 01/14/20 1132 01/14/20 1200  BP: 121/90 115/78  116/76  Pulse: (!) 122 (!) 124  (!) 123  Resp: (!) 33 (!) 29  20  Temp:   98.4 F (36.9 C)   TempSrc:   Oral   SpO2: 100% 100%  100%  Weight:      Height:        Intake/Output Summary (Last 24 hours) at 01/14/2020 1452 Last data filed at 01/14/2020 0400 Gross per 24 hour  Intake 2276 ml  Output 1350 ml  Net 926 ml   Filed Weights   01/12/20 0500 01/13/20 0500 01/14/20 0500  Weight: 116 kg 116.5 kg 118 kg    Examination:  General exam: Appears calm and comfortable, chronically sick looking.  Anxious. Respiratory system: Bilateral clear. Cardiovascular system: S1 & S2 heard, Tachycardia, normal rhythm. No murmurs, rubs, gallops or clicks. Gastrointestinal system: Abdomen is nondistended, soft and nontender. No organomegaly or masses felt. Normal bowel sounds heard. Colostomy left lower quadrant with loose stool. Colostomy bag attached to right lower quadrant collecting clear liquid, abdominal wall drainage. Foley catheter with clear urine. Central nervous system: Alert oriented x4.  Able to move all extremities. Musculoskeletal: Upper/Lower extremity edema. Right leg is erythematous with positive Homans' sign.  Distal neurovascular status intact. Skin: No cyanosis. No rashes   Data Reviewed: I have personally reviewed following labs and imaging studies  CBC Lab Results  Component Value Date   WBC 13.7 (H) 01/14/2020   RBC 2.92 (L) 01/14/2020   HGB 8.6 (L) 01/14/2020    HCT 27.9 (L) 01/14/2020   MCV 95.5 01/14/2020   MCH 29.5 01/14/2020   PLT 398 01/14/2020   MCHC 30.8 01/14/2020   RDW 28.0 (H) 01/14/2020   LYMPHSABS 1.6 01/14/2020   MONOABS 1.0 01/14/2020   EOSABS 0.2 01/14/2020   BASOSABS 0.0 48/54/6270     Last metabolic panel Lab Results  Component Value Date   NA 139 01/14/2020   K 3.5 01/14/2020   CL 109 01/14/2020   CO2 26 01/14/2020   BUN 9 01/14/2020   CREATININE <0.30 (L) 01/14/2020   GLUCOSE 139 (H) 01/14/2020   GFRNONAA NOT CALCULATED  01/14/2020   GFRAA NOT CALCULATED 01/14/2020   CALCIUM 7.6 (L) 01/14/2020   PHOS 3.0 01/13/2020   PROT 4.9 (L) 01/13/2020   ALBUMIN 1.2 (L) 01/13/2020   BILITOT 0.6 01/13/2020   ALKPHOS 127 (H) 01/13/2020   AST 29 01/13/2020   ALT 15 01/13/2020   ANIONGAP 4 (L) 01/14/2020    CBG (last 3)  Recent Labs    01/14/20 0324 01/14/20 0827 01/14/20 1130  GLUCAP 128* 123* 122*     GFR: CrCl cannot be calculated (This lab value cannot be used to calculate CrCl because it is not a number: <0.30).  Coagulation Profile: Recent Labs  Lab 01/11/20 0343  INR 1.2    No results found for this or any previous visit (from the past 240 hour(s)).      Radiology Studies: CT ANGIO CHEST PE W OR WO CONTRAST  Result Date: 01/13/2020 CLINICAL DATA:  Shortness of breath with elevated D-dimer. Concern for acute pulmonary embolism. EXAM: CT ANGIOGRAPHY CHEST WITH CONTRAST TECHNIQUE: Multidetector CT imaging of the chest was performed using the standard protocol during bolus administration of intravenous contrast. Multiplanar CT image reconstructions and MIPs were obtained to evaluate the vascular anatomy. CONTRAST:  52m OMNIPAQUE IOHEXOL 350 MG/ML SOLN COMPARISON:  CT chest dated June 26 21 FINDINGS: Cardiovascular: Evaluation for acute pulmonary emboli is significantly limited by respiratory motion artifact and the patient's body habitus. Given these limitations, no large centrally located pulmonary  embolism was detected. Detection of smaller pulmonary emboli is severely limited on this study. The heart size is enlarged. There is no significant pericardial effusion. There is no evidence for thoracic aortic dissection or aneurysm. The arch vessels are patent. There is a well-positioned left-sided PICC line. Mediastinum/Nodes: -- No mediastinal lymphadenopathy. -- No hilar lymphadenopathy. -- No axillary lymphadenopathy. -- No supraclavicular lymphadenopathy. -- Normal thyroid gland where visualized. -the enteric tube extends through the esophagus. Lungs/Pleura: There are small moderate-sized bilateral pleural effusions, slightly increased in size since prior study. There is atelectasis the lung bases. There is no pneumothorax. Atelectasis is noted in the right middle lobe. The trachea is unremarkable. Upper Abdomen: Contrast bolus timing is not optimized for evaluation of the abdominal organs. Body wall edema is again noted. There is probable underlying hepatic steatosis. There is a trace amount of free fluid in the upper abdomen about the patient's liver. Musculoskeletal: No chest wall abnormality. No bony spinal canal stenosis. Review of the MIP images confirms the above findings. IMPRESSION: 1. Evaluation for acute pulmonary emboli is significantly limited by respiratory motion artifact and the patient's body habitus. Given these limitations, no large centrally located pulmonary embolism was detected. Detection of smaller pulmonary emboli is severely limited on this study. 2. Small to moderate-sized bilateral pleural effusions, slightly increased in size since prior study. 3. Cardiomegaly. 4. Trace ascites in the upper abdomen. Electronically Signed   By: CConstance HolsterM.D.   On: 01/13/2020 22:39   VAS [place]OWER EXTREMITY VENOUS (DVT)  Result Date: 01/14/2020  Lower Venous DVTStudy Indications: Swelling.  Risk Factors: None identified. Limitations: Body habitus, poor ultrasound/tissue interface and  patient positioning, patient immobility. Comparison Study: No prior studies. Performing Technologist: GOliver HumRVT  Examination Guidelines: A complete evaluation includes B-mode imaging, spectral Doppler, color Doppler, and power Doppler as needed of all accessible portions of each vessel. Bilateral testing is considered an integral part of a complete examination. Limited examinations for reoccurring indications may be performed as noted. The reflux portion of the exam is  performed with the patient in reverse Trendelenburg.  +---------+---------------+---------+-----------+----------+--------------+ RIGHT    CompressibilityPhasicitySpontaneityPropertiesThrombus Aging +---------+---------------+---------+-----------+----------+--------------+ CFV      Full           Yes      Yes                                 +---------+---------------+---------+-----------+----------+--------------+ SFJ      Full                                                        +---------+---------------+---------+-----------+----------+--------------+ FV Prox  Full                                                        +---------+---------------+---------+-----------+----------+--------------+ FV Mid                  Yes      Yes                                 +---------+---------------+---------+-----------+----------+--------------+ FV Distal               Yes      Yes                                 +---------+---------------+---------+-----------+----------+--------------+ PFV      Full                                                        +---------+---------------+---------+-----------+----------+--------------+ POP      Full           Yes      Yes                                 +---------+---------------+---------+-----------+----------+--------------+ PTV      Full                                                         +---------+---------------+---------+-----------+----------+--------------+ PERO     Full                                                        +---------+---------------+---------+-----------+----------+--------------+   +---------+---------------+---------+-----------+----------+--------------+ LEFT     CompressibilityPhasicitySpontaneityPropertiesThrombus Aging +---------+---------------+---------+-----------+----------+--------------+ CFV      Full           Yes      Yes                                 +---------+---------------+---------+-----------+----------+--------------+  SFJ      Full                                                        +---------+---------------+---------+-----------+----------+--------------+ FV Prox  Full                                                        +---------+---------------+---------+-----------+----------+--------------+ FV Mid                  Yes      Yes                                 +---------+---------------+---------+-----------+----------+--------------+ FV Distal               Yes      Yes                                 +---------+---------------+---------+-----------+----------+--------------+ PFV      Full                                                        +---------+---------------+---------+-----------+----------+--------------+ POP      Full           Yes      Yes                                 +---------+---------------+---------+-----------+----------+--------------+ PTV                                                   Not visualized +---------+---------------+---------+-----------+----------+--------------+ PERO                                                  Not visualized +---------+---------------+---------+-----------+----------+--------------+     Summary: RIGHT: - There is no evidence of deep vein thrombosis in the lower extremity. However, portions of this  examination were limited- see technologist comments above.  - No cystic structure found in the popliteal fossa.  LEFT: - There is no evidence of deep vein thrombosis in the lower extremity. However, portions of this examination were limited- see technologist comments above.  - No cystic structure found in the popliteal fossa.  *See table(s) above for measurements and observations.    Preliminary         Scheduled Meds: . sodium chloride   Intravenous Once  . ascorbic acid  500 mg Per Tube BID  . chlorhexidine  15 mL Mouth Rinse BID  . Chlorhexidine Gluconate Cloth  6 each Topical Daily  .  collagenase   Topical Daily  . ergocalciferol  2,000 Units Per Tube Daily  . feeding supplement (PIVOT 1.5 CAL)  1,000 mL Per Tube Q24H  . feeding supplement (PRO-STAT SUGAR FREE 64)  30 mL Per Tube Daily  . ferrous sulfate  300 mg Per Tube BID WC  . folic acid  1 mg Per Tube Daily  . insulin aspart  0-6 Units Subcutaneous Q4H  . insulin aspart  4 Units Subcutaneous Q4H  . mouth rinse  15 mL Mouth Rinse q12n4p  . mesalamine  500 mg Rectal QHS  . multivitamin with minerals  1 tablet Per Tube Daily  . pantoprazole sodium  40 mg Per Tube BID  . sodium chloride flush  10-40 mL Intracatheter Q12H  . thiamine  100 mg Per Tube Daily  . vitamin A  20,000 Units Oral Daily  . zinc sulfate  220 mg Oral Daily   Continuous Infusions: . sodium chloride       LOS: 24 days   Total time spent: 35 minutes

## 2020-01-14 NOTE — Progress Notes (Signed)
Bilateral lower extremity venous duplex has been completed. Preliminary results can be found in CV Proc through chart review.   01/14/20 1:54 PM Alyssa Carey RVT

## 2020-01-14 NOTE — Progress Notes (Signed)
PT Cancellation Note  Patient Details Name: Alyssa Carey MRN: [id number] DOB: 1971/01/22   Cancelled Treatment:    Reason Eval/Treat Not Completed: Medical issues which prohibited therapy (RLE painful, red (from just below knee to ankle), swollen, pain with passive dorsiflexion of ankle. RN notified and will text MD. Will follow.)  Patient did perform A/AROM and stood x 23" on tilt bed 6/29 without notice of the right leg pain.    Claretha Cooper 01/14/2020, 10:15 AM  [place] Pager [phone] Office [phone]

## 2020-01-14 NOTE — Progress Notes (Signed)
Patient with 40 ml of urine output this shift. Bladder scan showed 396 ml. Foley irrigation attempted but resistance felt. Notified Dr. Sloan Leiter new order received.

## 2020-01-14 NOTE — Progress Notes (Addendum)
   01/14/20 1500 Hydro therapy note.  Subjective Assessment  Subjective patient awake, mouths words, shakes head  to answer  Patient and Family Stated Goals agreed to wound care today, nods yes   Date of Onset  (abscess present on admission)  Prior Treatments s/p surgical I & D x2, diverting colostomy  Evaluation and Treatment  Evaluation and Treatment Procedures Explained to Patient/Family Yes   Evaluation and Treatment Procedures Patient consents to hydro  Wound / Incision (Open or Dehisced) 12/27/19 Non-pressure wound Buttocks Left *PT ONLY* open wound L gluteal area  Date First Assessed/Time First Assessed: 12/27/19 1410   Wound Type: Non-pressure wound  Location: Buttocks  Location Orientation: Left  Wound Description (Comments): *PT ONLY* open wound L gluteal area  Present on Admission: (c)   Dressing Type Moist to dry  Dressing Changed Changed  Dressing Status Clean;Dry  Dressing Change Frequency Daily  Site / Wound Assessment Red (bleeeding)  % Wound base Red or Granulating 80%  % Wound base Yellow/Fibrinous Exudate 20%  Peri-wound Assessment Intact  Wound Length (cm) 17 cm  Wound Width (cm) 13 cm  Wound Depth (cm) 5 cm  Wound Volume (cm^3) 1105 cm^3  Wound Surface Area (cm^2) 221 cm^2  Margins Unattached edges (unapproximated)  Drainage Amount Copious  Drainage Description Serosanguineous (bloody)  Non-staged Wound Description Full thickness  Treatment Cleansed;Hydrotherapy (Pulse lavage);Packing (Saline gauze) (santyl)  Hydrotherapy  Pulsed lavage therapy - wound location L gluteal area   Pulsed Lavage with Suction (psi) 12 psi  Pulsed Lavage with Suction - Normal Saline Used 500 mL  Pulsed Lavage Tip Tip with splash shield  Wound Therapy - Assess/Plan/Recommendations  Wound Therapy - Clinical Statement patient  is awake. HR up to 154. Just had EKG, RN said perform Hydro/dressing change. Patient tolerated well. The  wound is pink and filling in  the base. remains   with slough area of necrosis  at 10-3:00. Patient is premediciated prior and does not indicate being in pain. Patient did state before hydro started that she did not want the picking done.  Wound Therapy - Functional Problem List immobility,    Factors Delaying/Impairing Wound Healing Substance abuse;Immobility;Multiple medical problems  Hydrotherapy Plan Debridement;Dressing change;Patient/family education;Pulsatile lavage with suction  Wound Therapy - Frequency 3X / week  Wound Therapy - Current Recommendations PT (when less sedated)  Wound Therapy - Follow Up Recommendations Skilled nursing facility (LTAC)  Wound Plan Pt will benefit from hydrotherapy as part a multi-modal/multi-disciplinary wound care to approach to facilitate healing and decr bioburden.   Wound Therapy Goals - Improve the function of patient's integumentary system by progressing the wound(s) through the phases of wound healing by:  Decrease Necrotic Tissue to 100  Decrease Necrotic Tissue - Progress Progressing toward goal  Increase Granulation Tissue to 0  Increase Granulation Tissue - Progress Progressing toward goal  Improve Drainage Characteristics Mod  Improve Drainage Characteristics - Progress Progressing toward goal  Goals/treatment plan/discharge plan were made with and agreed upon by patient/family No, Patient unable to participate in goals/treatment/discharge plan and family unavailable  Time For Goal Achievement Other (comment) (3 wks)  Wound Therapy - Potential for Goals Fair  [place] Pager [phone] Office [phone]

## 2020-01-14 NOTE — Progress Notes (Signed)
Notified by PT patient complaining of right leg pain. Upon assessment  Right leg red a swollen pain rating 10/10. Pedal pulse +2. weak dorsiflexion and plantar flexion noted. Dr. Sloan Leiter  Notified and new order received

## 2020-01-14 NOTE — Progress Notes (Signed)
NUTRITION NOTE  Patient is currently ordered 40 mg megace once/day. Patient remains NPO and is receiving TF via NGT. Dr. Sloan Leiter agreeable to megace being discontinued until oral diet able to be safely ordered.     Alyssa Matin, MS, RD, LDN, CNSC Inpatient Clinical Dietitian RD pager # available in [place]  After hours/weekend pager # available in [facility]

## 2020-01-14 NOTE — Progress Notes (Signed)
  Speech Language Pathology Treatment: Dysphagia  Patient Details Name: Alyssa Carey MRN: [id number] DOB: [birth year] Today's Date: 01/14/2020 Time: 1202-1225 SLP Time Calculation (min) (ACUTE ONLY): 23 min  Assessment / Plan / Recommendation Clinical Impression  Pt today with subjectively improved breath support evidenced by strength in her whisper abilities.  Able to help self feed and bring oral suction catheter to her mouth.  SLP assisted pt to brush her teeth after which she used oral suction to clear.    Pt was able to cough and expectorate viscous secretions x1 during session using oral suction catheter.     Trials of nectar thick Lemon Lime soda and magic cup consumed.  Pt continues with delay in swallowing most notably with liquids.  Encouraged her to organize bolus and swallow to prevent premature spillage into larynx.  Further 3 boluses were swallowed more efficiently.  No indication of aspiration nor significant increase in RR, etc noted.    As pt is demonstrating significant overall improvement in conditioning, recommend to advance her diet to full liquids/nectar encouraging her to self feed.   Using teach back, pt was educated to aspiration precautions, silent aspiration of thins on MBS need to organize and swallow efficiently and importance of pt resting if short of breath or coughing.   Reached out to MD to seek approval for dietary advancement and obtained orders.  Will follow up clinically to assess pt tolerance an readiness for dietary advancement.  Pt may benefit from RMST to strengthen ability for airway clearance with her silent aspiration.    HPI HPI: 49 year old with alcohol abuse, alcohol withdrawal, necrotizing fasciitis of left buttock status post debridement. Acute respiratory failure secondary to septic shock. Intubated from 6/8 to 6/23.   Pt underwent MBS on Saturday 01/09/2020 with findings of silent aspiration of nectar and thin.  Recommended to start diet of  dys1/nectar.  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  ENT advised pt high aspiration risk.  Follow up indicated.      SLP Plan  Continue with current plan of care       Recommendations  Diet recommendations: Nectar-thick liquid;Thin liquid Medication Administration: Whole meds with puree Supervision: Full supervision/cueing for compensatory strategies;Staff to assist with self feeding Compensations: Slow rate;Small sips/bites;Minimize environmental distractions Postural Changes and/or Swallow Maneuvers: Seated upright 90 degrees;Out of bed for meals                Oral Care Recommendations: Oral care QID Follow up Recommendations: Skilled Nursing facility SLP Visit Diagnosis: Dysphagia, oropharyngeal phase (R13.12) Plan: Continue with current plan of care       GO                Macario Golds 01/14/2020, 12:41 PM  Kathleen Lime, MS [place] Office [phone]

## 2020-01-14 NOTE — Progress Notes (Signed)
Informed Dr. Hal Hope that the patient's CTA had resulted. No changes in vitals or signs of distress at this time.

## 2020-01-15 LAB — COMPREHENSIVE METABOLIC PANEL
ALT: 11 U/L (ref 0–44)
AST: 20 U/L (ref 15–41)
Albumin: 1.1 g/dL — ABNORMAL LOW (ref 3.5–5.0)
Alkaline Phosphatase: 93 U/L (ref 38–126)
Anion gap: 8 (ref 5–15)
BUN: 13 mg/dL (ref 6–20)
CO2: 25 mmol/L (ref 22–32)
Calcium: 7.4 mg/dL — ABNORMAL LOW (ref 8.9–10.3)
Chloride: 109 mmol/L (ref 98–111)
Creatinine, Ser: <0.3 mg/dL — ABNORMAL LOW (ref 0.44–1.00)
Glucose, Bld: 138 mg/dL — ABNORMAL HIGH (ref 70–99)
Potassium: 3.3 mmol/L — ABNORMAL LOW (ref 3.5–5.1)
Sodium: 142 mmol/L (ref 135–145)
Total Bilirubin: 0.7 mg/dL (ref 0.3–1.2)
Total Protein: 4.3 g/dL — ABNORMAL LOW (ref 6.5–8.1)

## 2020-01-15 LAB — CBC WITH DIFFERENTIAL/PLATELET
Abs Immature Granulocytes: 0.13 10*3/uL — ABNORMAL HIGH (ref 0.00–0.07)
Basophils Absolute: 0 10*3/uL (ref 0.0–0.1)
Basophils Relative: 0 %
Eosinophils Absolute: 0.2 10*3/uL (ref 0.0–0.5)
Eosinophils Relative: 1 %
HCT: 27.9 % — ABNORMAL LOW (ref 36.0–46.0)
Hemoglobin: 8.6 g/dL — ABNORMAL LOW (ref 12.0–15.0)
Immature Granulocytes: 1 %
Lymphocytes Relative: 10 %
Lymphs Abs: 1.6 10*3/uL (ref 0.7–4.0)
MCH: 29.4 pg (ref 26.0–34.0)
MCHC: 30.8 g/dL (ref 30.0–36.0)
MCV: 95.2 fL (ref 80.0–100.0)
Monocytes Absolute: 1.4 10*3/uL — ABNORMAL HIGH (ref 0.1–1.0)
Monocytes Relative: 8 %
Neutro Abs: 13.4 10*3/uL — ABNORMAL HIGH (ref 1.7–7.7)
Neutrophils Relative %: 80 %
Platelets: 380 10*3/uL (ref 150–400)
RBC: 2.93 MIL/uL — ABNORMAL LOW (ref 3.87–5.11)
RDW: 26.7 % — ABNORMAL HIGH (ref 11.5–15.5)
WBC: 16.7 10*3/uL — ABNORMAL HIGH (ref 4.0–10.5)
nRBC: 0 % (ref 0.0–0.2)

## 2020-01-15 LAB — GLUCOSE, CAPILLARY
Glucose-Capillary: 126 mg/dL — ABNORMAL HIGH (ref 70–99)
Glucose-Capillary: 123 mg/dL — ABNORMAL HIGH (ref 70–99)
Glucose-Capillary: 142 mg/dL — ABNORMAL HIGH (ref 70–99)
Glucose-Capillary: 104 mg/dL — ABNORMAL HIGH (ref 70–99)
Glucose-Capillary: 77 mg/dL (ref 70–99)
Glucose-Capillary: 56 mg/dL — ABNORMAL LOW (ref 70–99)

## 2020-01-15 LAB — MAGNESIUM: Magnesium: 1.5 mg/dL — ABNORMAL LOW (ref 1.7–2.4)

## 2020-01-15 LAB — PHOSPHORUS: Phosphorus: 2.1 mg/dL — ABNORMAL LOW (ref 2.5–4.6)

## 2020-01-15 MED ORDER — POTASSIUM CHLORIDE 20 MEQ/15ML (10%) PO SOLN
20.0000 meq | Freq: Every day | ORAL | Status: DC
  Administered 2020-01-15: 20 meq
  Filled 2020-01-15 (×2): qty 15

## 2020-01-15 MED ORDER — POTASSIUM CHLORIDE 20 MEQ/15ML (10%) PO SOLN
30.0000 meq | Freq: Once | ORAL | Status: AC
  Administered 2020-01-15: 30 meq
  Filled 2020-01-15: qty 30

## 2020-01-15 MED ORDER — MAGNESIUM SULFATE 2 GM/50ML IV SOLN
2.0000 g | Freq: Once | INTRAVENOUS | Status: AC
  Administered 2020-01-15: 2 g via INTRAVENOUS
  Filled 2020-01-15: qty 50

## 2020-01-15 MED ORDER — ERGOCALCIFEROL 200 MCG/ML PO SOLN
2000.0000 [IU] | Freq: Every day | ORAL | Status: AC
  Administered 2020-01-15: 2000 [IU]
  Filled 2020-01-15: qty 0.25

## 2020-01-15 MED ORDER — FUROSEMIDE 10 MG/ML IJ SOLN
20.0000 mg | Freq: Two times a day (BID) | INTRAMUSCULAR | Status: DC
  Administered 2020-01-15 – 2020-01-19 (×9): 20 mg via INTRAVENOUS
  Filled 2020-01-15 (×9): qty 2

## 2020-01-15 MED ORDER — ERGOCALCIFEROL 200 MCG/ML PO SOLN
50000.0000 [IU] | ORAL | Status: DC
  Administered 2020-01-16 – 2020-01-23 (×2): 50000 [IU]
  Filled 2020-01-15 (×2): qty 6.25

## 2020-01-15 NOTE — Progress Notes (Signed)
Son wiped the pt. Down with purple wipes 06/30 pt. Skin significantly more red, swollen, dry and rashy on various parts of her body today 07/01. MD notified.

## 2020-01-15 NOTE — Progress Notes (Signed)
PROGRESS NOTE    Alyssa Carey  TMA:[id number] DOB: 11/22/1970 DOA: 12/21/2019 PCP: Patient, No Pcp Per   Chef Complaints:: Alcohol withdrawal/necrotizing fasciitis  Brief Narrative: 49 year old female who presented secondary to concern for alcohol withdrawal and quickly developed evidence of necrotizing fasciitis, emergently managed in the OR with critical care, general surgery with I&D.  Patient was in ICU needing intubation and vasopressors for septic shock underwent repeat I&D and diverting colostomy.  She has been extubated and off vasopressors.  Hydrotherapy was started.  Remains in the stepdown unit and remains critically ill with multiple medical issues.  Significant events 6/06 admission 6/08 ICU admission to OR ofr I&D, back intubated and on pressors, 6/09 started tube feeds. Still pressor dependent,kept on vent w/ anticipated return to OR planned  6/10 Back to the OR for significant debridement and also placement of diverting colostomy 6/11 Sedated, low-dose phenylephrine. CCS felt wound looking good. Sedation changed to precedex / PRN fent  6/12 more awake, on 15/5 pressure support 6/13 family discussion >> DNR, continue medical care 6//15 transfuse 1 unit PRBC; vomiting, tube feeds held 6/16 Vomiting overnight 6/17 Trial PSV 15/5 with variable Vt, remains on vasopressors. Vomiting overnight  6/18 transfuse 1 unit PRBC 6/20 Off pressors, On precedex and weaning on PSV  6/21 Weaning on PSV, planned hydrotherapy 6/23 Extubated 6/24 Transferred to [facility] service.  Subjective: Patient denies any complaints.  Complains of mild itching.  Nursing reports son used "purple wipes" to clean her skin throughout and patient has been having redness/erythema. Afebrile overnight heart rate 110-130 On room air 100% blood pressure in 100s-90s Had urine retention 396 mL 6/30 in scan-Foley was exchanged Lab with potassium 3.3, phosphorus 2.1, mag 1.511 1.1 Leukocytosis 16.7K anemia with  hemoglobin 8.6 g. Appears edematous/anasarca noted significant weight gain on 6/8/9 with around 193-208-213 LB AND 256 lbTODAY  Assessment & Plan:  Necrotizing fasciitis of pelvic region and thigh: Emergent operative intervention with incision and debridement and was treated with IV antibiotics, diverting loop colostomy, Foley catheter to aid healing and receiving hydrotherapy.  Surgery is following, continue wound care.  Had recurrent bleeding from the wound area.  Septic shock secondary to necrotizing fasciitis needing ICU admissions intubation pressors.  Resolved.  Completed antibiotics 6/22 treated with multiple antibiotics including linezolid clindamycin Flagyl.  Wound grew E. coli, VRE.  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  Leukocytosis: WBC remains elevated but afebrile.  Monitor closely for any signs of infection. Recent Labs  Lab 01/11/20 0924 01/12/20 0848 01/13/20 1647 01/14/20 0420 01/15/20 0245  WBC 14.5* 11.6* 13.0* 13.7* 16.7*   Hypokalemia/hypomagnesemia: We will continue to monitor and replete.  At 50 M EQ potassium daily while on diuretics. Recent Labs  Lab 01/12/20 0419 01/12/20 1719 01/13/20 0200 01/14/20 0420 01/15/20 0245  K 3.2* 3.2* 3.8 3.5 3.3*   Acute hypoxic respiratory failure: Initially intubated and subsequently extubated and on room air.  Severe protein calorie malnutrition with low albumin, anasarca with poor nutritional status due to severe illness.  2.  Continue NG tube feeding until patient has good oral intake to maintain her calories continue dietitian input on board and monitor electrolytes.  Fluid overload/anasarca: Noted significant gain in the weight in the setting of volume resuscitation/antibiotics IV fluids, hypoalbuminemia.   We will try gentle diuresis as  bp tolerates. Renal function stable.  Pressure injury/DTI of right ear not present on admission continue daily dressing.  AKI with unknown baseline 1.7 upon admission, resolved.  Anemia of critical illness/acute blood loss anemia Baseline hemoglobin 12-13 developed anemia in the setting of infection on, critical illness, bleeding, receive 3 units of PRBC so far.  Patient has rectal bleeding GI consulted and performed bedside flexible sigmoidoscopy significant for 10 cm Stercoral ulcer 6/28 .  Continue to monitor CBC, transfuse for hemoglobin less than 7, mesalamine suppository for 10 days  Morbid obesity with BMI 43.9.  Will benefit with weight loss and healthy lifestyle once acute issue resolves on outpatient basis  Tachycardia sinus in the setting of pain, anemia, wound multiple issues.  Continue metoprolol as needed, continue pain control.  She had CT of the chest that was negative for PE right lower leg is inflamed and duplex are negative for DVT.  Monitor closely.  Erythema generalized: likely from use of Purple WIPE by patient's family and patient family has been educated on not to do anything without asking patient's nurses.  No significant itching.  Monitor closely.  DVT prophylaxis: Place and maintain sequential compression device Start: 01/10/20 1738.  Lovenox stopped due to bleeding from the wound Code Status: full Family Communication: plan of care discussed with patient at bedside.  Status is: Inpatient  Remains inpatient appropriate because:Inpatient level of care appropriate due to severity of illness and For ongoing management wound, postop, multiple issues, poor oral intake   Dispo: The patient is from: Home              Anticipated d/c is to: LTAC              Anticipated d/c date is: > 3 days              Patient currently is not medically stable to d/c.  Nutrition: Diet Order            Diet full liquid Room service appropriate? Yes;  Fluid consistency: Nectar Thick  Diet effective now                 Nutrition Problem: Increased nutrient needs Etiology: chronic illness, wound healing (alcohol abuse; necrotizing fasciitis) Signs/Symptoms: estimated needs Interventions: Refer to RD note for recommendations Body mass index is 43.97 kg/m. Pressure Ulcer: Pressure Injury 12/27/19 Ear Right Deep Tissue Pressure Injury - Purple or maroon localized area of discolored intact skin or blood-filled blister due to damage of underlying soft tissue from pressure and/or shear. (Active)  12/27/19 1200  Location: Ear  Location Orientation: Right  Staging: Deep Tissue Pressure Injury - Purple or maroon localized area of discolored intact skin or blood-filled blister due to damage of underlying soft tissue from pressure and/or shear.  Wound Description (Comments):   Present on Admission: No   Consultants:see note  Procedures:  ENDOTRACHEAL INTUBATION (6/8 >> 01/07/2020)  INCISION AND DEBRIDEMENT OF LEFT BUTTOCK AND PERINEUM (12/23/2019)  IRRIGATION AND DEBRIDEMENT OF BUTTOCKS; LAPAROSCOPIC LOOP COLOSTOMY (12/25/2019)  FLEXIBLE SIGMOIDOSCOPY (01/11/2020)   Microbiology:see note  Medications: Scheduled Meds: . sodium chloride   Intravenous Once  . ascorbic acid  500 mg Per Tube BID  . chlorhexidine  15 mL Mouth Rinse BID  . Chlorhexidine Gluconate Cloth  6 each Topical Daily  . collagenase   Topical Daily  . ergocalciferol  2,000 Units Per Tube Daily  . [START ON 01/16/2020] ergocalciferol  50,000 Units Per Tube Q Fri  . feeding supplement (  PIVOT 1.5 CAL)  1,000 mL Per Tube Q24H  . feeding supplement (PRO-STAT SUGAR FREE 64)  30 mL Per Tube Daily  . ferrous sulfate  300 mg Per Tube BID WC  . folic acid  1 mg Per Tube Daily  . insulin aspart  0-6 Units Subcutaneous Q4H  . insulin aspart  4 Units Subcutaneous Q4H  . mouth rinse  15 mL Mouth Rinse q12n4p  . mesalamine  500 mg Rectal QHS  . multivitamin with minerals  1 tablet  Per Tube Daily  . pantoprazole sodium  40 mg Per Tube BID  . sodium chloride flush  10-40 mL Intracatheter Q12H  . thiamine  100 mg Per Tube Daily  . vitamin A  20,000 Units Oral Daily  . zinc sulfate  220 mg Oral Daily   Continuous Infusions: . sodium chloride      Antimicrobials:  Ceftriaxone  Vancomycin  Clindamycin  Linezolid  Flagyl  Finished all antibiotic therapy. Anti-infectives (From admission, onward)   Start     Dose/Rate Route Frequency Ordered Stop   12/31/19 1600  metroNIDAZOLE (FLAGYL) IVPB 500 mg  Status:  Discontinued        500 mg 100 mL/hr over 60 Minutes Intravenous Every 8 hours 12/31/19 1505 01/06/20 0859   12/28/19 1400  vancomycin (VANCOCIN) IVPB 1000 mg/200 mL premix  Status:  Discontinued        1,000 mg 200 mL/hr over 60 Minutes Intravenous Every 24 hours 12/28/19 1005 12/28/19 1343   12/28/19 1400  linezolid (ZYVOX) IVPB 600 mg        600 mg 300 mL/hr over 60 Minutes Intravenous Every 12 hours 12/28/19 1343 01/06/20 1954   12/26/19 1948  vancomycin variable dose per unstable renal function (pharmacist dosing)  Status:  Discontinued         Does not apply See admin instructions 12/26/19 1948 12/28/19 1005   12/23/19 2000  vancomycin (VANCOCIN) IVPB 1000 mg/200 mL premix  Status:  Discontinued        1,000 mg 200 mL/hr over 60 Minutes Intravenous Every 8 hours 12/23/19 1027 12/26/19 1944   12/23/19 1300  clindamycin (CLEOCIN) IVPB 900 mg  Status:  Discontinued        900 mg 100 mL/hr over 30 Minutes Intravenous Every 8 hours 12/23/19 1205 12/31/19 1505   12/23/19 1200  metroNIDAZOLE (FLAGYL) IVPB 500 mg  Status:  Discontinued        500 mg 100 mL/hr over 60 Minutes Intravenous Every 8 hours 12/23/19 1135 12/23/19 1254   12/23/19 1045  vancomycin (VANCOREADY) IVPB 2000 mg/400 mL        2,000 mg 200 mL/hr over 120 Minutes Intravenous NOW 12/23/19 1018 12/23/19 1441   12/23/19 0300  cefTRIAXone (ROCEPHIN) 2 g in sodium chloride 0.9 % 100 mL IVPB   Status:  Discontinued        2 g 200 mL/hr over 30 Minutes Intravenous Every 24 hours 12/23/19 0236 01/06/20 0859       Objective: Vitals: Today's Vitals   01/15/20 0302 01/15/20 0400 01/15/20 0700 01/15/20 0800  BP:  115/70 104/70 (!) 101/59  Pulse:  (!) 120 (!) 125 (!) 117  Resp:  (!) 29 (!) 27 (!) 30  Temp:  98.9 F (37.2 C)  98.2 F (36.8 C)  TempSrc:  Oral  Oral  SpO2:  100% 100% 100%  Weight: 116.2 kg     Height:      PainSc:  0-No pain  Intake/Output Summary (Last 24 hours) at 01/15/2020 0909 Last data filed at 01/15/2020 0400 Gross per 24 hour  Intake 1770 ml  Output 925 ml  Net 845 ml   Filed Weights   01/13/20 0500 01/14/20 0500 01/15/20 0302  Weight: 116.5 kg 118 kg 116.2 kg   Weight change: -1.8 kg   Intake/Output from previous day: 06/30 0701 - 07/01 0700 In: 1800 [NG/GT:1800] Out: 1025 [Urine:600; Stool:425] Intake/Output this shift: No intake/output data recorded.  Examination:  General exam: AAO , WEAK, OLD FOR AGE, OBESE,NAD, weak appearing. HEENT:Oral mucosa moist, Ear/Nose WNL grossly,dentition normal. Respiratory system: bilaterally clear,no wheezing or crackles,no use of accessory muscle, non tender. Cardiovascular system: S1 & S2 +, regular, No JVD. Gastrointestinal system: Abdomen soft, obese,colostomy + on LLQ with stool, drain on rt side form skin. NT,ND, BS+. Foley+ Nervous System:Alert, awake, moving extremities and grossly nonfocal Extremities: Bilateral upper and lower extremity edema present generalized erythema present  edema, distal peripheral pulses palpable.  Skin: No rashes,no icterus. MSK: Normal muscle bulk,tone, power  Data Reviewed: I have personally reviewed following labs and imaging studies CBC: Recent Labs  Lab 01/11/20 0924 01/12/20 0848 01/13/20 1647 01/14/20 0420 01/15/20 0245  WBC 14.5* 11.6* 13.0* 13.7* 16.7*  NEUTROABS  --   --   --  10.8* 13.4*  HGB 7.6* 8.6* 6.8* 8.6* 8.6*  HCT 25.1* 28.0* 22.4*  27.9* 27.9*  MCV 102.4* 102.6* 104.2* 95.5 95.2  PLT 488* 474* 424* 398 597   Basic Metabolic Panel: Recent Labs  Lab 01/11/20 0804 01/11/20 1800 01/12/20 0419 01/12/20 1719 01/13/20 0200 01/13/20 1840 01/14/20 0420 01/15/20 0245  NA  --   --  143 142 141  --  139 142  K  --    < > 3.2* 3.2* 3.8  --  3.5 3.3*  CL  --   --  114* 112* 111  --  109 109  CO2  --   --  26 24 23   --  26 25  GLUCOSE  --   --  100* 113* 132*  --  139* 138*  BUN  --   --  6 5* 7  --  9 13  CREATININE  --   --  <0.30* 0.33* 0.32*  --  <0.30* <0.30*  CALCIUM  --   --  7.5* 7.6* 7.6*  --  7.6* 7.4*  MG 1.8  --   --  1.4* 1.8 1.7  --  1.5*  PHOS  --   --   --  3.9 3.6 3.0  --  2.1*   < > = values in this interval not displayed.   GFR: CrCl cannot be calculated (This lab value cannot be used to calculate CrCl because it is not a number: <0.30). Liver Function Tests: Recent Labs  Lab 01/13/20 0200 01/15/20 0245  AST 29 20  ALT 15 11  ALKPHOS 127* 93  BILITOT 0.6 0.7  PROT 4.9* 4.3*  ALBUMIN 1.2* 1.1*   No results for input(s): LIPASE, AMYLASE in the last 168 hours. No results for input(s): AMMONIA in the last 168 hours. Coagulation Profile: Recent Labs  Lab 01/11/20 0343  INR 1.2   Cardiac Enzymes: No results for input(s): CKTOTAL, CKMB, CKMBINDEX, TROPONINI in the last 168 hours. BNP (last 3 results) No results for input(s): PROBNP in the last 8760 hours. HbA1C: No results for input(s): HGBA1C in the last 72 hours. CBG: Recent Labs  Lab 01/14/20 1532 01/14/20 1952 01/14/20 2334 01/15/20 0327  01/15/20 0806  GLUCAP 137* 119* 122* 126* 123*   Lipid Profile: No results for input(s): CHOL, HDL, LDLCALC, TRIG, CHOLHDL, LDLDIRECT in the last 72 hours. Thyroid Function Tests: No results for input(s): TSH, T4TOTAL, FREET4, T3FREE, THYROIDAB in the last 72 hours. Anemia Panel: No results for input(s): VITAMINB12, FOLATE, FERRITIN, TIBC, IRON, RETICCTPCT in the last 72 hours. Sepsis  Labs: No results for input(s): PROCALCITON, LATICACIDVEN in the last 168 hours.  No results found for this or any previous visit (from the past 240 hour(s)).    Radiology Studies: CT ANGIO CHEST PE W OR WO CONTRAST  Result Date: 01/13/2020 CLINICAL DATA:  Shortness of breath with elevated D-dimer. Concern for acute pulmonary embolism. EXAM: CT ANGIOGRAPHY CHEST WITH CONTRAST TECHNIQUE: Multidetector CT imaging of the chest was performed using the standard protocol during bolus administration of intravenous contrast. Multiplanar CT image reconstructions and MIPs were obtained to evaluate the vascular anatomy. CONTRAST:  1m OMNIPAQUE IOHEXOL 350 MG/ML SOLN COMPARISON:  CT chest dated June 26 21 FINDINGS: Cardiovascular: Evaluation for acute pulmonary emboli is significantly limited by respiratory motion artifact and the patient's body habitus. Given these limitations, no large centrally located pulmonary embolism was detected. Detection of smaller pulmonary emboli is severely limited on this study. The heart size is enlarged. There is no significant pericardial effusion. There is no evidence for thoracic aortic dissection or aneurysm. The arch vessels are patent. There is a well-positioned left-sided PICC line. Mediastinum/Nodes: -- No mediastinal lymphadenopathy. -- No hilar lymphadenopathy. -- No axillary lymphadenopathy. -- No supraclavicular lymphadenopathy. -- Normal thyroid gland where visualized. -the enteric tube extends through the esophagus. Lungs/Pleura: There are small moderate-sized bilateral pleural effusions, slightly increased in size since prior study. There is atelectasis the lung bases. There is no pneumothorax. Atelectasis is noted in the right middle lobe. The trachea is unremarkable. Upper Abdomen: Contrast bolus timing is not optimized for evaluation of the abdominal organs. Body wall edema is again noted. There is probable underlying hepatic steatosis. There is a trace amount of free  fluid in the upper abdomen about the patient's liver. Musculoskeletal: No chest wall abnormality. No bony spinal canal stenosis. Review of the MIP images confirms the above findings. IMPRESSION: 1. Evaluation for acute pulmonary emboli is significantly limited by respiratory motion artifact and the patient's body habitus. Given these limitations, no large centrally located pulmonary embolism was detected. Detection of smaller pulmonary emboli is severely limited on this study. 2. Small to moderate-sized bilateral pleural effusions, slightly increased in size since prior study. 3. Cardiomegaly. 4. Trace ascites in the upper abdomen. Electronically Signed   By: CConstance HolsterM.D.   On: 01/13/2020 22:39   VAS [place]OWER EXTREMITY VENOUS (DVT)  Result Date: 01/14/2020  Lower Venous DVTStudy Indications: Swelling.  Risk Factors: None identified. Limitations: Body habitus, poor ultrasound/tissue interface and patient positioning, patient immobility. Comparison Study: No prior studies. Performing Technologist: GOliver HumRVT  Examination Guidelines: A complete evaluation includes B-mode imaging, spectral Doppler, color Doppler, and power Doppler as needed of all accessible portions of each vessel. Bilateral testing is considered an integral part of a complete examination. Limited examinations for reoccurring indications may be performed as noted. The reflux portion of the exam is performed with the patient in reverse Trendelenburg.  +---------+---------------+---------+-----------+----------+--------------+ RIGHT    CompressibilityPhasicitySpontaneityPropertiesThrombus Aging +---------+---------------+---------+-----------+----------+--------------+ CFV      Full           Yes      Yes                                 +---------+---------------+---------+-----------+----------+--------------+  SFJ      Full                                                         +---------+---------------+---------+-----------+----------+--------------+ FV Prox  Full                                                        +---------+---------------+---------+-----------+----------+--------------+ FV Mid                  Yes      Yes                                 +---------+---------------+---------+-----------+----------+--------------+ FV Distal               Yes      Yes                                 +---------+---------------+---------+-----------+----------+--------------+ PFV      Full                                                        +---------+---------------+---------+-----------+----------+--------------+ POP      Full           Yes      Yes                                 +---------+---------------+---------+-----------+----------+--------------+ PTV      Full                                                        +---------+---------------+---------+-----------+----------+--------------+ PERO     Full                                                        +---------+---------------+---------+-----------+----------+--------------+   +---------+---------------+---------+-----------+----------+--------------+ LEFT     CompressibilityPhasicitySpontaneityPropertiesThrombus Aging +---------+---------------+---------+-----------+----------+--------------+ CFV      Full           Yes      Yes                                 +---------+---------------+---------+-----------+----------+--------------+ SFJ      Full                                                        +---------+---------------+---------+-----------+----------+--------------+  FV Prox  Full                                                        +---------+---------------+---------+-----------+----------+--------------+ FV Mid                  Yes      Yes                                  +---------+---------------+---------+-----------+----------+--------------+ FV Distal               Yes      Yes                                 +---------+---------------+---------+-----------+----------+--------------+ PFV      Full                                                        +---------+---------------+---------+-----------+----------+--------------+ POP      Full           Yes      Yes                                 +---------+---------------+---------+-----------+----------+--------------+ PTV                                                   Not visualized +---------+---------------+---------+-----------+----------+--------------+ PERO                                                  Not visualized +---------+---------------+---------+-----------+----------+--------------+     Summary: RIGHT: - There is no evidence of deep vein thrombosis in the lower extremity. However, portions of this examination were limited- see technologist comments above.  - No cystic structure found in the popliteal fossa.  LEFT: - There is no evidence of deep vein thrombosis in the lower extremity. However, portions of this examination were limited- see technologist comments above.  - No cystic structure found in the popliteal fossa.  *See table(s) above for measurements and observations. Electronically signed by Harold Barban MD on 01/14/2020 at 8:50:45 PM.    Final      LOS: 45 days   Antonieta Pert, MD [facility]  01/15/2020, 9:09 AM

## 2020-01-15 NOTE — Progress Notes (Signed)
Physical Therapy Treatment Patient Details Name: Alyssa Carey MRN: [id number] DOB: [birth year] Today's Date: 01/15/2020    History of Present Illness 49 y/o female with a heavy alcohol history admitted6/8/21  in the setting of confusion and presumed alcohol withdrawal who later developed sepsis from a gas forming wound, S/P debridement left gluteal wound for necratizing fascitis.VDRF,. Extubated 01/07/20., negative for DVT in bilateral legs and PE.    PT Comments    The patient is very awake, very flat affect, whispers to communicate. Patient found with notable redness on legs and arms, back and neck. Also signifucant edema of all limbs compared to visit 6/30. RN aware. Patient is awake and follows commands. Patient indicates right foot/ankle painful. Negative dopplers of both legs 01/14/20. Will not tilt bed today due to painful right ankle/foot.Continue Tilt bed  as tolerated.  Follow Up Recommendations  SNF     Equipment Recommendations    TBA   Recommendations for Other Services       Precautions / Restrictions Precautions Precautions: Fall Precaution Comments: left gluteal wound, L colostomy, R abd drain from Lap incision-DRAIN BOTH BEFORE ROLLING, NG tube    Mobility  Bed Mobility                  Transfers                 General transfer comment: did not tilt  due to right leg pain  Ambulation/Gait                 Stairs             Wheelchair Mobility    Modified Rankin (Stroke Patients Only)       Balance                                            Cognition Arousal/Alertness: Awake/alert Behavior During Therapy: Flat affect Overall Cognitive Status: Difficult to assess Area of Impairment: Awareness                   Current Attention Level: Selective   Following Commands: Follows one step commands consistently   Awareness: Emergent   General Comments: patient A/O x 3, following 1-2 step,  instruxcting therapist to pull up her socks, put the PAS on      Exercises General Exercises - Lower Extremity Ankle Circles/Pumps: AAROM;AROM;Both;10 reps Quad Sets: AROM;Both;10 reps Heel Slides: AAROM;Left;10 reps;Supine Shoulder Exercises Shoulder Flexion: AAROM;10 reps;Seated Elbow Flexion: AROM;Right;20 reps;Seated (AAROM LUE x 20 reps) Wrist Extension: AROM;Right;20 reps;Seated (AAROm for LUE)    General Comments        Pertinent Vitals/Pain Pain Assessment: Faces Faces Pain Scale: Hurts even more Pain Location: right foot when dorsiflexing Pain Descriptors / Indicators: Discomfort Pain Intervention(s): Limited activity within patient's tolerance;Monitored during session    Home Living                      Prior Function            PT Goals (current goals can now be found in the care plan section) Progress towards PT goals: Progressing toward goals    Frequency    Min 2X/week      PT Plan Current plan remains appropriate    Co-evaluation   Reason for Co-Treatment: Complexity of the patient's impairments (multi-system involvement)  OT goals addressed during session: Strengthening/ROM;ADL's and self-care      AM-PAC PT "6 Clicks" Mobility   Outcome Measure  Help needed turning from your back to your side while in a flat bed without using bedrails?: A Lot Help needed moving from lying on your back to sitting on the side of a flat bed without using bedrails?: A Lot Help needed moving to and from a bed to a chair (including a wheelchair)?: Total Help needed standing up from a chair using your arms (e.g., wheelchair or bedside chair)?: Total Help needed to walk in hospital room?: Total Help needed climbing 3-5 steps with a railing? : Total 6 Click Score: 8    End of Session   Activity Tolerance: Patient tolerated treatment well Patient left: in bed;with call bell/phone within reach Nurse Communication: Mobility status;Need for lift  equipment PT Visit Diagnosis: Muscle weakness (generalized) (M62.81)     Time: 3643-8377 PT Time Calculation (min) (ACUTE ONLY): 26 min  Charges:  $Therapeutic Exercise: 8-22 mins $Therapeutic Activity: 8-22 mins                     Tresa Endo PT Acute Rehabilitation Services Pager [phone] Office [phone]    Claretha Cooper 01/15/2020, 1:03 PM

## 2020-01-15 NOTE — Progress Notes (Signed)
Physical Therapy Treatment Patient Details Name: Alyssa Carey MRN: [id number] DOB: 04-15-1971 Today's Date: 01/15/2020    History of Present Illness 49 y/o female with a heavy alcohol history admitted6/8/21  in the setting of confusion and presumed alcohol withdrawal who later developed sepsis from a gas forming wound, S/P debridement left gluteal wound for necratizing fascitis.VDRF,. Extubated 01/07/20., negative for DVT in bilateral legs and PE.    PT Comments    The patient alert and ZO x3. Placed in chair position of bed x 30 " to work on sitting, raching and balance. Did not tilt on bed due to right  Foot pain. Continue  Mobility and tilting to stand in bed as tolerated.  Follow Up Recommendations  SNF     Equipment Recommendations       Recommendations for Other Services       Precautions / Restrictions Precautions Precautions: Fall Precaution Comments: left gluteal wound, L colostomy, R abd drain from [place], NG tube    Mobility  Bed Mobility               General bed mobility comments: placed bed in chair position x 30". Patient able to lean forward and sit unsupported. could reach for feet and pull up socks. HR 125, RR 34, SPO2 RA 100%  Transfers                 General transfer comment: did not tilt  due to right leg pain  Ambulation/Gait                 Stairs             Wheelchair Mobility    Modified Rankin (Stroke Patients Only)       Balance Overall balance assessment: Needs assistance Sitting-balance support: No upper extremity supported;Feet supported   Sitting balance - Comments: sits with chair position of bed with no back support, weight shifts , leans forward.                                    Cognition Arousal/Alertness: Awake/alert Behavior During Therapy: Flat affect Overall Cognitive Status: Difficult to assess Area of Impairment: Awareness                    Current Attention Level: Selective   Following Commands: Follows one step commands consistently   Awareness: Emergent   General Comments: patient A/O x 3, following 1-2 step, instructing therapist to pull up her socks, put the PAS on      Exercises LAQ x 10 both. Legs, AAROM both ankles    General Comments        Pertinent Vitals/Pain Pain Assessment: Faces Faces Pain Scale: Hurts even more Pain Location: right foot when dorsiflexing Pain Descriptors / Indicators: Discomfort Pain Intervention(s): Monitored during session    Home Living                      Prior Function            PT Goals (current goals can now be found in the care plan section) Progress towards PT goals: Progressing toward goals    Frequency    Min 2X/week      PT Plan Current plan remains appropriate    Co-evaluation PT/OT/SLP Co-Evaluation/Treatment: Yes Reason for Co-Treatment: Complexity of the patient's impairments (multi-system involvement);For patient/therapist  safety;To address functional/ADL transfers PT goals addressed during session: Mobility/safety with mobility OT goals addressed during session: ADL's and self-care      AM-PAC PT "6 Clicks" Mobility   Outcome Measure  Help needed turning from your back to your side while in a flat bed without using bedrails?: A Lot Help needed moving from lying on your back to sitting on the side of a flat bed without using bedrails?: A Lot Help needed moving to and from a bed to a chair (including a wheelchair)?: Total Help needed standing up from a chair using your arms (e.g., wheelchair or bedside chair)?: Total Help needed to walk in hospital room?: Total Help needed climbing 3-5 steps with a railing? : Total 6 Click Score: 8    End of Session   Activity Tolerance: Patient tolerated treatment well Patient left: in bed;with call bell/phone within reach Nurse Communication: Mobility status;Need for lift  equipment PT Visit Diagnosis: Muscle weakness (generalized) (M62.81)     Time: 0923-3007 PT Time Calculation (min) (ACUTE ONLY): 38 min  Charges:   $Therapeutic Activity: 23-37 mins                     Alyssa Carey PT Acute Rehabilitation Services Pager [phone] Office [phone]    Alyssa Carey 01/15/2020, 1:17 PM

## 2020-01-15 NOTE — Progress Notes (Signed)
  Speech Language Pathology Treatment: Dysphagia  Patient Details Name: Alyssa Carey MRN: [id number] DOB: [birth year] Today's Date: 01/15/2020 Time: 1211-1225 SLP Time Calculation (min) (ACUTE ONLY): 14 min  Assessment / Plan / Recommendation Clinical Impression  RN reports minimal intake today, just some nectar-thick cranberry juice.  Pt with bed in chair position; her father was at bedside.  She requested more juice, which was thickened to nectar, and pt was able to hold cup and bring straw to lips independently.  She took small, controlled sips.  There were no obvious s/s of aspiration, nor difficulty described by the pt.  We reviewed ENT findings of vocal fold involvement/paresis on both sides, and discussed the necessity of vocal fold closure for voice and swallowing. As voice begins to improve, we should see concomitant improvements with swallowing.  Continue cortrak as primary nutrition for now; supplement with nectar-thick liquids, purees (full liquid diet) for enjoyment.  If pt demonstrates clinical improvements early next week, she may benefit from repeat instrumental swallow study.  Will follow.    HPI HPI: 49 year old with alcohol abuse, alcohol withdrawal, necrotizing fasciitis of left buttock status post debridement. Acute respiratory failure secondary to septic shock. Intubated from 6/8 to 6/23.   Pt underwent MBS on Saturday 01/09/2020 with findings of infrequent and trace, silent aspiration of nectar and thin.  Recommended to start diet of dys1/small, controlled sips of nectar.  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  ENT advised pt high aspiration risk.  Follow up indicated.      SLP Plan  Continue with current  plan of care       Recommendations  Diet recommendations: Nectar-thick liquid Liquids provided via: Cup;Straw Medication Administration: Crushed with puree Supervision: Full supervision/cueing for compensatory strategies;Staff to assist with self feeding Compensations: Slow rate;Small sips/bites Postural Changes and/or Swallow Maneuvers: Seated upright 90 degrees                Oral Care Recommendations: Oral care QID SLP Visit Diagnosis: Dysphagia, oropharyngeal phase (R13.12) Plan: Continue with current plan of care       GO                [name] 01/15/2020, 12:32 PM  [name] L. Tivis Ringer, [place] Office number [phone] Pager [phone]

## 2020-01-15 NOTE — Progress Notes (Signed)
Occupational Therapy Treatment Patient Details Name: Alyssa Carey MRN: [id number] DOB: 1971-03-20 Today's Date: 01/15/2020    History of present illness 49 y/o female with a heavy alcohol history admitted6/8/21  in the setting of confusion and presumed alcohol withdrawal who later developed sepsis from a gas forming wound, S/P debridement left gluteal wound for necratizing fascitis.VDRF,   OT comments  Patient participated in therapeutic exercises for biltaral upper extremities and adl task while in seated bed position today. Patient's LUE exhibits increased edema today and requires active assist to perform exercises. Patient tolerated grooming and bathing task and is demonstrating more trunk and RUE movement. Cont POC  Follow Up Recommendations  SNF    Equipment Recommendations  Other (comment)    Recommendations for Other Services      Precautions / Restrictions Precautions Precaution Comments: left gluteal wound, L colostomy, R abd drain from Lap incision-DRAIN BOTH BEFORE ROLLING, NG tube       Mobility Bed Mobility                  Transfers                      Balance                                           ADL either performed or assessed with clinical judgement   ADL       Grooming: Wash/dry face;Set up;Minimal assistance;Bed level Grooming Details (indicate cue type and reason): bed placed in chair position. Needed set up and min assist to reach forehead, decreased quality of task secondary to weakness, ability to grossly apply skin moisterizer Upper Body Bathing: Moderate assistance;Set up;Bed level Upper Body Bathing Details (indicate cue type and reason): Therapist assisted patient with washing back and applying moisteriziner while patient performed active trunk movement and learning forward. Patient washing her hands and her lower arms with cloth.                                 Vision       Perception      Praxis      Cognition Arousal/Alertness: Awake/alert Behavior During Therapy: Flat affect Overall Cognitive Status: Difficult to assess Area of Impairment: Awareness                   Current Attention Level: Selective   Following Commands: Follows one step commands consistently   Awareness: Emergent            Exercises Shoulder Exercises Shoulder Flexion: AAROM;10 reps;Seated Elbow Flexion: AROM;Right;20 reps;Seated (AAROM LUE x 20 reps) Wrist Extension: AROM;Right;20 reps;Seated (AAROm for LUE)   Shoulder Instructions       General Comments      Pertinent Vitals/ Pain       Pain Assessment: Faces Faces Pain Scale: Hurts a little bit Pain Location: r foot Pain Descriptors / Indicators: Discomfort  Home Living                                          Prior Functioning/Environment              Frequency  Min 2X/week  Progress Toward Goals  OT Goals(current goals can now be found in the care plan section)  Progress towards OT goals: Progressing toward goals     Plan Discharge plan remains appropriate    Co-evaluation    PT/OT/SLP Co-Evaluation/Treatment: Yes Reason for Co-Treatment: Complexity of the patient's impairments (multi-system involvement)   OT goals addressed during session: Strengthening/ROM;ADL's and self-care      AM-PAC OT "6 Clicks" Daily Activity     Outcome Measure   Help from another person eating meals?: A Lot Help from another person taking care of personal grooming?: A Lot Help from another person toileting, which includes using toliet, bedpan, or urinal?: Total Help from another person bathing (including washing, rinsing, drying)?: A Lot Help from another person to put on and taking off regular upper body clothing?: Total Help from another person to put on and taking off regular lower body clothing?: Total 6 Click Score: 9    End of Session    OT Visit Diagnosis: Muscle  weakness (generalized) (M62.81)   Activity Tolerance Patient tolerated treatment well   Patient Left in bed;with call bell/phone within reach   Nurse Communication Mobility status        Time: 7308-5694 OT Time Calculation (min): 37 min  Charges: OT Treatments $Therapeutic Exercise: 8-22 mins  Derl Barrow, OTR/L [place]  Office [phone] Pager: [phone]    Lenward Chancellor 01/15/2020, 12:39 PM

## 2020-01-16 ENCOUNTER — Inpatient Hospital Stay (HOSPITAL_COMMUNITY): Payer: Medicaid Other

## 2020-01-16 ENCOUNTER — Inpatient Hospital Stay (HOSPITAL_COMMUNITY): Payer: Self-pay

## 2020-01-16 DIAGNOSIS — M7989 Other specified soft tissue disorders: Secondary | ICD-10-CM

## 2020-01-16 LAB — CBC WITH DIFFERENTIAL/PLATELET
Abs Immature Granulocytes: 0.07 10*3/uL (ref 0.00–0.07)
Basophils Absolute: 0 10*3/uL (ref 0.0–0.1)
Basophils Relative: 0 %
Eosinophils Absolute: 0.3 10*3/uL (ref 0.0–0.5)
Eosinophils Relative: 2 %
HCT: 26 % — ABNORMAL LOW (ref 36.0–46.0)
Hemoglobin: 7.9 g/dL — ABNORMAL LOW (ref 12.0–15.0)
Immature Granulocytes: 1 %
Lymphocytes Relative: 14 %
Lymphs Abs: 1.8 10*3/uL (ref 0.7–4.0)
MCH: 29.6 pg (ref 26.0–34.0)
MCHC: 30.4 g/dL (ref 30.0–36.0)
MCV: 97.4 fL (ref 80.0–100.0)
Monocytes Absolute: 1.3 10*3/uL — ABNORMAL HIGH (ref 0.1–1.0)
Monocytes Relative: 10 %
Neutro Abs: 9 10*3/uL — ABNORMAL HIGH (ref 1.7–7.7)
Neutrophils Relative %: 73 %
Platelets: 330 10*3/uL (ref 150–400)
RBC: 2.67 MIL/uL — ABNORMAL LOW (ref 3.87–5.11)
RDW: 25.7 % — ABNORMAL HIGH (ref 11.5–15.5)
WBC: 12.5 10*3/uL — ABNORMAL HIGH (ref 4.0–10.5)
nRBC: 0 % (ref 0.0–0.2)

## 2020-01-16 LAB — COMPREHENSIVE METABOLIC PANEL
ALT: 13 U/L (ref 0–44)
AST: 22 U/L (ref 15–41)
Albumin: 1.2 g/dL — ABNORMAL LOW (ref 3.5–5.0)
Alkaline Phosphatase: 92 U/L (ref 38–126)
Anion gap: 5 (ref 5–15)
BUN: 13 mg/dL (ref 6–20)
CO2: 28 mmol/L (ref 22–32)
Calcium: 7.8 mg/dL — ABNORMAL LOW (ref 8.9–10.3)
Chloride: 107 mmol/L (ref 98–111)
Creatinine, Ser: <0.3 mg/dL — ABNORMAL LOW (ref 0.44–1.00)
Glucose, Bld: 93 mg/dL (ref 70–99)
Potassium: 3.3 mmol/L — ABNORMAL LOW (ref 3.5–5.1)
Sodium: 140 mmol/L (ref 135–145)
Total Bilirubin: 0.3 mg/dL (ref 0.3–1.2)
Total Protein: 4.8 g/dL — ABNORMAL LOW (ref 6.5–8.1)

## 2020-01-16 LAB — PHOSPHORUS: Phosphorus: 2.5 mg/dL (ref 2.5–4.6)

## 2020-01-16 LAB — GLUCOSE, CAPILLARY
Glucose-Capillary: 104 mg/dL — ABNORMAL HIGH (ref 70–99)
Glucose-Capillary: 104 mg/dL — ABNORMAL HIGH (ref 70–99)
Glucose-Capillary: 105 mg/dL — ABNORMAL HIGH (ref 70–99)
Glucose-Capillary: 59 mg/dL — ABNORMAL LOW (ref 70–99)
Glucose-Capillary: 66 mg/dL — ABNORMAL LOW (ref 70–99)
Glucose-Capillary: 75 mg/dL (ref 70–99)
Glucose-Capillary: 88 mg/dL (ref 70–99)
Glucose-Capillary: 99 mg/dL (ref 70–99)

## 2020-01-16 LAB — MAGNESIUM: Magnesium: 1.8 mg/dL (ref 1.7–2.4)

## 2020-01-16 MED ORDER — POTASSIUM CHLORIDE 20 MEQ/15ML (10%) PO SOLN
20.0000 meq | Freq: Once | ORAL | Status: DC
  Filled 2020-01-16: qty 15

## 2020-01-16 MED ORDER — DEXTROSE 50 % IV SOLN
INTRAVENOUS | Status: AC
  Administered 2020-01-16: 50 mL
  Filled 2020-01-16: qty 50

## 2020-01-16 MED ORDER — POTASSIUM CHLORIDE 20 MEQ/15ML (10%) PO SOLN
20.0000 meq | Freq: Two times a day (BID) | ORAL | Status: DC
  Administered 2020-01-16 – 2020-01-17 (×4): 20 meq via ORAL
  Filled 2020-01-16 (×6): qty 15

## 2020-01-16 NOTE — Progress Notes (Signed)
[facility] Progress Note  5 Days Post-Op  Subjective: Patient seen during hydrotherapy   Objective: Vital signs in last 24 hours: Temp:  [97.5 F (36.4 C)-98.9 F (37.2 C)] 97.5 F (36.4 C) (07/02 0756) Pulse Rate:  [113-129] 129 (07/02 0400) Resp:  [16-30] 23 (07/02 1000) BP: (96-144)/(39-80) 137/77 (07/02 1000) SpO2:  [98 %-100 %] 100 % (07/02 0800) Weight:  [116.1 kg] 116.1 kg (07/02 0500) Last BM Date: 01/15/20  Intake/Output from previous day: 07/01 0701 - 07/02 0700 In: 1862.4 [NG/GT:1820; IV Piggyback:42.4] Out: 3225 [Urine:2175; Stool:1050] Intake/Output this shift: Total I/O In: -  Out: 1700 [Urine:1700]  PE:  Wound as seen above with some fibrinous exudate superiorly still, granulation tissue appears healthy. Having some mucus drainage from rectum.    Lab Results:  Recent Labs    01/15/20 0245 01/16/20 0552  WBC 16.7* 12.5*  HGB 8.6* 7.9*  HCT 27.9* 26.0*  PLT 380 330   BMET Recent Labs    01/15/20 0245 01/16/20 0552  NA 142 140  K 3.3* 3.3*  CL 109 107  CO2 25 28  GLUCOSE 138* 93  BUN 13 13  CREATININE <0.30* <0.30*  CALCIUM 7.4* 7.8*   PT/INR No results for input(s): LABPROT, INR in the last 72 hours. CMP     Component Value Date/Time   NA 140 01/16/2020 0552   K 3.3 (L) 01/16/2020 0552   CL 107 01/16/2020 0552   CO2 28 01/16/2020 0552   GLUCOSE 93 01/16/2020 0552   BUN 13 01/16/2020 0552   CREATININE <0.30 (L) 01/16/2020 0552   CALCIUM 7.8 (L) 01/16/2020 0552   PROT 4.8 (L) 01/16/2020 0552   ALBUMIN 1.2 (L) 01/16/2020 0552   AST 22 01/16/2020 0552   ALT 13 01/16/2020 0552   ALKPHOS 92 01/16/2020 0552   BILITOT 0.3 01/16/2020 0552   GFRNONAA NOT CALCULATED 01/16/2020 0552   GFRAA NOT CALCULATED 01/16/2020 0552   Lipase  No results found for: LIPASE     Studies/Results: DG Abd 1 View  Result Date: 01/16/2020 CLINICAL DATA:  Nasogastric tube placement. EXAM: ABDOMEN - 1 VIEW COMPARISON:  January 16, 2020.  FINDINGS: The bowel gas pattern is normal. Distal tip of feeding tube is seen in distal stomach which is unchanged compared to prior exam. No radio-opaque calculi or other significant radiographic abnormality are seen. IMPRESSION: Distal tip of feeding tube seen in distal stomach. Electronically Signed   By: Marijo Conception M.D.   On: 01/16/2020 09:09   DG Abd 1 View  Result Date: 01/16/2020 CLINICAL DATA:  Check gastric catheter placement EXAM: ABDOMEN - 1 VIEW COMPARISON:  Film from earlier in the same day. FINDINGS: Weighted feeding catheter is noted extending into at least the distal stomach and possibly into the proximal duodenum. It is uncertain whether the catheter is kinked upon itself within the distal stomach or proceeds into the proximal duodenum. IMPRESSION: Weighted feeding catheter as described. There is kinking of the catheter distally which may lie within the distal stomach or proximal duodenum. Contrast injection may be helpful for further evaluation. Electronically Signed   By: Inez Catalina M.D.   On: 01/16/2020 08:57   DG Abd 1 View  Result Date: 01/16/2020 CLINICAL DATA:  Nasogastric tube placement EXAM: ABDOMEN - 1 VIEW COMPARISON:  01/12/2020 FINDINGS: Weighted feeding tube with tip at the proximal duodenum. Distally, the tube is kinked in the setting of indwelling wire. The visualized bowel gas pattern is nonobstructive IMPRESSION: Feeding tube with  tip at the proximal duodenum. Electronically Signed   By: Monte Fantasia M.D.   On: 01/16/2020 05:17   VAS [place] LOWER EXTREMITY VENOUS (DVT)  Result Date: 01/14/2020  Lower Venous DVTStudy Indications: Swelling.  Risk Factors: None identified. Limitations: Body habitus, poor ultrasound/tissue interface and patient positioning, patient immobility. Comparison Study: No prior studies. Performing Technologist: Oliver Hum RVT  Examination Guidelines: A complete evaluation includes B-mode imaging, spectral Doppler, color Doppler, and power  Doppler as needed of all accessible portions of each vessel. Bilateral testing is considered an integral part of a complete examination. Limited examinations for reoccurring indications may be performed as noted. The reflux portion of the exam is performed with the patient in reverse Trendelenburg.  +---------+---------------+---------+-----------+----------+--------------+ RIGHT    CompressibilityPhasicitySpontaneityPropertiesThrombus Aging +---------+---------------+---------+-----------+----------+--------------+ CFV      Full           Yes      Yes                                 +---------+---------------+---------+-----------+----------+--------------+ SFJ      Full                                                        +---------+---------------+---------+-----------+----------+--------------+ FV Prox  Full                                                        +---------+---------------+---------+-----------+----------+--------------+ FV Mid                  Yes      Yes                                 +---------+---------------+---------+-----------+----------+--------------+ FV Distal               Yes      Yes                                 +---------+---------------+---------+-----------+----------+--------------+ PFV      Full                                                        +---------+---------------+---------+-----------+----------+--------------+ POP      Full           Yes      Yes                                 +---------+---------------+---------+-----------+----------+--------------+ PTV      Full                                                        +---------+---------------+---------+-----------+----------+--------------+  PERO     Full                                                        +---------+---------------+---------+-----------+----------+--------------+    +---------+---------------+---------+-----------+----------+--------------+ LEFT     CompressibilityPhasicitySpontaneityPropertiesThrombus Aging +---------+---------------+---------+-----------+----------+--------------+ CFV      Full           Yes      Yes                                 +---------+---------------+---------+-----------+----------+--------------+ SFJ      Full                                                        +---------+---------------+---------+-----------+----------+--------------+ FV Prox  Full                                                        +---------+---------------+---------+-----------+----------+--------------+ FV Mid                  Yes      Yes                                 +---------+---------------+---------+-----------+----------+--------------+ FV Distal               Yes      Yes                                 +---------+---------------+---------+-----------+----------+--------------+ PFV      Full                                                        +---------+---------------+---------+-----------+----------+--------------+ POP      Full           Yes      Yes                                 +---------+---------------+---------+-----------+----------+--------------+ PTV                                                   Not visualized +---------+---------------+---------+-----------+----------+--------------+ PERO                                                  Not visualized +---------+---------------+---------+-----------+----------+--------------+  Summary: RIGHT: - There is no evidence of deep vein thrombosis in the lower extremity. However, portions of this examination were limited- see technologist comments above.  - No cystic structure found in the popliteal fossa.  LEFT: - There is no evidence of deep vein thrombosis in the lower extremity. However, portions of this examination were  limited- see technologist comments above.  - No cystic structure found in the popliteal fossa.  *See table(s) above for measurements and observations. Electronically signed by Harold Barban MD on 01/14/2020 at 8:50:45 PM.    Final     Anti-infectives: Anti-infectives (From admission, onward)   Start     Dose/Rate Route Frequency Ordered Stop   12/31/19 1600  metroNIDAZOLE (FLAGYL) IVPB 500 mg  Status:  Discontinued        500 mg 100 mL/hr over 60 Minutes Intravenous Every 8 hours 12/31/19 1505 01/06/20 0859   12/28/19 1400  vancomycin (VANCOCIN) IVPB 1000 mg/200 mL premix  Status:  Discontinued        1,000 mg 200 mL/hr over 60 Minutes Intravenous Every 24 hours 12/28/19 1005 12/28/19 1343   12/28/19 1400  linezolid (ZYVOX) IVPB 600 mg        600 mg 300 mL/hr over 60 Minutes Intravenous Every 12 hours 12/28/19 1343 01/06/20 1954   12/26/19 1948  vancomycin variable dose per unstable renal function (pharmacist dosing)  Status:  Discontinued         Does not apply See admin instructions 12/26/19 1948 12/28/19 1005   12/23/19 2000  vancomycin (VANCOCIN) IVPB 1000 mg/200 mL premix  Status:  Discontinued        1,000 mg 200 mL/hr over 60 Minutes Intravenous Every 8 hours 12/23/19 1027 12/26/19 1944   12/23/19 1300  clindamycin (CLEOCIN) IVPB 900 mg  Status:  Discontinued        900 mg 100 mL/hr over 30 Minutes Intravenous Every 8 hours 12/23/19 1205 12/31/19 1505   12/23/19 1200  metroNIDAZOLE (FLAGYL) IVPB 500 mg  Status:  Discontinued        500 mg 100 mL/hr over 60 Minutes Intravenous Every 8 hours 12/23/19 1135 12/23/19 1254   12/23/19 1045  vancomycin (VANCOREADY) IVPB 2000 mg/400 mL        2,000 mg 200 mL/hr over 120 Minutes Intravenous NOW 12/23/19 1018 12/23/19 1441   12/23/19 0300  cefTRIAXone (ROCEPHIN) 2 g in sodium chloride 0.9 % 100 mL IVPB  Status:  Discontinued        2 g 200 mL/hr over 30 Minutes Intravenous Every 24 hours 12/23/19 0236 01/06/20 0859        Assessment/Plan Acute hypoxic respiratory failure - now on nasal cannula Hepatic encephalopathy Alcohol abuse Thrombocytopenia  HTN Anemia of critical illness and ABL anemia - had some bleeding from wound and rectum over the weekend, s/p GI scope which showed a rectal ulcer, bleeding seems to have stopped now   Morbid obesity - BMI 43.90  Necrotizing fasciitis of left buttock -S/Pincision and debridement of skin, subcutaneous tissue, fascia and muscle 15x15x6cm, left buttock5/8 Dr. 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 Redmond Pulling - Ostomy functioning. WOCN following -Continue hydrotherapy MWF - continue santyl to help with superior edge of wound, TID dressing changes - will see again next week    LOS: 26 days    Norm Parcel , [facility] 01/16/2020, 10:47 AM Please see Amion for pager number during day hours  7:00am-4:30pm

## 2020-01-16 NOTE — Progress Notes (Signed)
PT Cancellation Note  Patient Details Name: Alyssa Carey MRN: [id number] DOB: 18-Dec-1970   Cancelled Treatment:    Reason Eval/Treat Not Completed: Other (comment)Did not tilt patient on bed due to ongoing right foot pain, does seem better than yesterday. Placed in partial bed chair position.    Claretha Cooper 01/16/2020, 12:55 PM  [place] Pager [phone] Office [phone]

## 2020-01-16 NOTE — Progress Notes (Signed)
  Speech Language Pathology Treatment: Dysphagia  Patient Details Name: Alyssa Carey MRN: [id number] DOB: 05/21/1971 Today's Date: 01/16/2020 Time: 1212-1223 SLP Time Calculation (min) (ACUTE ONLY): 11 min  Assessment / Plan / Recommendation Clinical Impression  No real clinical improvement in voice or swallowing today.  Pt provided with ice chips, positioned upright in bed.  Encouraged to complete effortful swallow with throat-clear and second swallow - throat clear difficult to achieve.  Pt follows instructions well with only occasional verbal cues needed.  Voice remains aphonic despite best effort.  Pt asked for watermelon, grapes.  Reviewed why her swallowing muscles are not yet ready for that kind of food, that progress will be slow but steady.  She verbalizes understanding.  SLP will continue to follow for readiness for repeat instrumental swallow study.   HPI HPI: 49 year old with alcohol abuse, alcohol withdrawal, necrotizing fasciitis of left buttock status post debridement. Acute respiratory failure secondary to septic shock. Intubated from 6/8 to 6/23.   Pt underwent MBS on Saturday 01/09/2020 with findings of infrequent and trace, silent aspiration of nectar and thin.  Recommended to start diet of dys1/small, controlled sips of nectar.  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  ENT advised pt high aspiration risk.  Follow up indicated.      SLP Plan  Continue with current plan of care   ice chips between meals and after oral care.    Recommendations  Diet recommendations: Nectar-thick liquid full liquid diet Liquids provided via: Cup;Straw Medication Administration: Crushed with puree Supervision: Full  supervision/cueing for compensatory strategies;Staff to assist with self feeding Compensations: Slow rate;Small sips/bites Postural Changes and/or Swallow Maneuvers: Seated upright 90 degrees                Oral Care Recommendations: Oral care QID SLP Visit Diagnosis: Dysphagia, oropharyngeal phase (R13.12) Plan: Continue with current plan of care       GO                Alyssa Carey 01/16/2020, 12:31 PM  Alyssa Carey Alyssa Carey, Alyssa Carey Office number [phone] Pager [phone]

## 2020-01-16 NOTE — Progress Notes (Signed)
Left upper extremity venous duplex has been completed. Preliminary results can be found in CV Proc through chart review.   01/16/20 4:36 PM Alyssa Carey RVT

## 2020-01-16 NOTE — Progress Notes (Signed)
Nutrition Follow-up  DOCUMENTATION CODES:   Obesity unspecified  INTERVENTION:  - once tube is in an appropriate position, restart Pivot 1.5 @ 70 ml/hr with 30 ml prostat once/day. - free water flush, if desired, to be per CCS or MD.   NUTRITION DIAGNOSIS:   Increased nutrient needs related to chronic illness, wound healing (alcohol abuse; necrotizing fasciitis) as evidenced by estimated needs. -ongoing  GOAL:   Patient will meet greater than or equal to 90% of their needs -met with TF regimen  MONITOR:   TF tolerance, PO intake, Labs, Weight trends, Skin  ASSESSMENT:   Pt with PMH of heavy alcohol history who for one week with decreased intake admitted 6/6 with alcoholic hepatitis with hepatic encephalopathy and AKI.  Significant Events: 6/7-admission; initial RD assessment 6/8-Rapid Response; CT pelvis to r/o necrotizing fascitis--found to be positive for necrotizing fascitis and went to OR for I&D 6/9-remained intubated with OGT in place; started TF; added ascorbic acid BID and zinc once/day.  6/10-return to OR for I&D; lap loop colostomy 6/16-vomiting overnight; TF held and OGT to LIS 6/17-vomiting overnight; OGT removed d/t aspiration event and being partly pulled; small bore NGT placed 6/18-restarted TF at trickle rate 6/21- small bore NGT removed; OGT placed 6/23- extubated; OGT removed; MBS completed and diet advanced to Dysphagia 1, nectar-thick 6/27- s/p flex sig 6/28- NGT placed in R nare; diet changed to NPO 6/30- diet advanced to FLD, nectar-thick   Patient resting in bed with no family/visitors present at this time. Patient is able to mouth words and indicates that she is not having any abdominal discomfort this AM.   TF is hanging but is not connected to NGT. Able to talk with RN who reports patient did have some nectar-thick apple juice this AM and did well with this. He states that patient pulled NGT overnight and that it needed to be replaced but  that tube tip was too deep and needed to be pulled back at which time it became kinked and needed to be adjusted.   Imaging report from 1019 states that the tube is looped in the proximal stomach. RN reports that once tube is in an appropriate position, plan to re-start TF.  Order in place for Pivot 1.5 @ 70 ml/hr with 30 ml prostat once/day. This regimen provides 2620 kcal, 172 grams protein, and 1275 ml free water.     Labs reviewed; CBGs: 104, 66, 59 mg/dl, K: 3.3 mmol/l, creatinine: <0.3 mg/dl, Ca: 7.8 mg/dl. Medications reviewed; 500 mg ascorbic acid BID, 02585 units drisdol per tube every Friday, 300 mg ferrous sulfate BID, 1 mg folvite/day, 20 mg IV lasix BID, sliding scale novolog, 4 units novolog every 4 hours, 2 g IV Mg sulfate x1 run 7/1, 1 tablet multivitamin with minerals, 40 mg protonix per tube BID, 20 mEq KCl BID, 100 mg thiamine/day, 20000 units vitamin A orally/day (6/30-7/9), 220 mg zinc sulfate/day.    Diet Order:   Diet Order            Diet full liquid Room service appropriate? Yes; Fluid consistency: Nectar Thick  Diet effective now                 EDUCATION NEEDS:   Not appropriate for education at this time  Skin:  Skin Assessment:  (necrotizing fasciitis to L buttock) Skin Integrity Issues:: DTI DTI: R ear Stage II: NA Unstageable: NA Incisions: L buttocks (6/8 + 6/10); abdomen (6/10)  Last BM:  7/1  Height:  Ht Readings from Last 1 Encounters:  01/05/20 5' 4"  (1.626 m)    Weight:   Wt Readings from Last 1 Encounters:  01/16/20 116.1 kg     Estimated Nutritional Needs:  Kcal:  2500-2700 kcal Protein:  160-175 grams Fluid:  >/= 2.3 L/day     Alyssa Matin, MS, RD, LDN, CNSC Inpatient Clinical Dietitian RD pager # available in [place]  After hours/weekend pager # available in [facility]

## 2020-01-16 NOTE — Progress Notes (Signed)
01/16/20  Hydrotherapy note  Subjective Assessment  Subjective patient awake, mouths words, shakes head  to answer  Patient and Family Stated Goals agreed to wound care today, nods yes   Date of Onset  (abscess present on admission)  Prior Treatments s/p surgical I & D x2, diverting colostomy  Evaluation and Treatment  Evaluation and Treatment Procedures Explained to Patient/Family Yes, nods head   Evaluation and Treatment Procedures Patient unable to consent due to mental status  Wound / Incision (Open or Dehisced) 12/27/19 Non-pressure wound Buttocks Left *PT ONLY* open wound L gluteal area  Date First Assessed/Time First Assessed: 12/27/19 1410   Wound Type: Non-pressure wound  Location: Buttocks  Location Orientation: Left  Wound Description (Comments): *PT ONLY* open wound L gluteal area  Present on Admission: (c)   Dressing Type Moist to dry (santyl)  Dressing Changed New  Dressing Status Clean;Dry  Dressing Change Frequency Daily  % Wound base Red or Granulating 80%  % Wound base Yellow/Fibrinous Exudate 20%  Peri-wound Assessment Intact (small areas that are red)  Wound Length (cm) 17 cm  Wound Width (cm) 13 cm  Wound Depth (cm) 5 cm  Wound Volume (cm^3) 1105 cm^3  Wound Surface Area (cm^2) 221 cm^2  Tunneling (cm) 6 (at 5:00)  Undermining (cm) 4  Margins Unattached edges (unapproximated)  Drainage Amount Copious  Drainage Description Serosanguineous  Non-staged Wound Description Full thickness  Treatment Cleansed;Debridement (Selective);Hydrotherapy (Pulse lavage);Packing (Saline gauze) (santyl)  Hydrotherapy  Pulsed lavage therapy - wound location L gluteal area   Pulsed Lavage with Suction (psi) 12 psi  Pulsed Lavage with Suction - Normal Saline Used 1000 mL  Pulsed Lavage Tip Tip with splash shield  Selective Debridement  Selective Debridement - Location left glute  Selective Debridement - Tools Used Forceps;Scalpel  Selective Debridement - Tissue Removed  necrotic fat, slough   Wound Therapy - Assess/Plan/Recommendations  Wound Therapy - Clinical Statement patient is awake. Patient nods head to answer, speaks softly. RN medicated  patient at start of treatment. Surgery PA in to inspect  wound. Necrotic fat and slough and  connective tissue remain  at 12-3:00.. PA recommends to continue Hydrotherapy 3 x's per week and reassess 7/9(Friday).. Oozing mucous from rectum which ran into wound. Had finished PLS  so used a NS syringe to wash the area out. Then applied dressing Continue goals for wound care for 2 more weeks.( 01/30/2020.)  Wound Therapy - Functional Problem List immobility, very falt affect with very little initiation  Factors Delaying/Impairing Wound Healing Substance abuse;Immobility;Multiple medical problems  Hydrotherapy Plan Debridement;Dressing change;Patient/family education;Pulsatile lavage with suction, debridement  Wound Therapy - Frequency 3X / week  Wound Therapy - Current Recommendations PT   Wound Therapy - Follow Up Recommendations Skilled nursing facility   Wound Plan Pt will benefit from hydrotherapy as part a multi-modal/multi-disciplinary wound care to approach to facilitate healing and decr bioburden.  Reassess need for Hydrotherapy 7/9 with surgery.  Wound Therapy Goals - Improve the function of patient's integumentary system by progressing the wound(s) through the phases of wound healing by:  Decrease Necrotic Tissue to 100  Decrease Necrotic Tissue - Progress Progressing toward goal  Increase Granulation Tissue to 0  Increase Granulation Tissue - Progress Progressing toward goal  Improve Drainage Characteristics Mod  Improve Drainage Characteristics - Progress Progressing toward goal  Time For Goal Achievement Other (comment);2 weeks   Wound Therapy - Potential for Goals Good  [place] Pager [phone] Office  [phone]  

## 2020-01-16 NOTE — TOC Progression Note (Signed)
Transition of Care [facility]) - Progression Note    Patient Details  Name: ELEXIS POLLAK MRN: [id number] Date of Birth: Oct 27, 1970  Transition of Care [facility]) CM/SW Contact  Leeroy Cha, RN Phone Number: 01/16/2020, 8:28 AM  Clinical Narrative:    Financial counselor notified of need to see if can get onto medicaid.  Message for Shanon Rosser.   Expected Discharge Plan: [place] Barriers to Discharge: Continued Medical Work up  Expected Discharge Plan and Services Expected Discharge Plan: [place] In-house Referral: Development worker, community                                             Social Determinants of Health (SDOH) Interventions    Readmission Risk Interventions No flowsheet data found.

## 2020-01-16 NOTE — Progress Notes (Signed)
PROGRESS NOTE    Alyssa Carey  XHB:[id number] DOB: Sep 10, 1970 DOA: 12/21/2019 PCP: Patient, No Pcp Per   Chef Complaints:: Alcohol withdrawal   Brief Narrative: 49 year old female who presented secondary to concern for alcohol withdrawal and quickly developed evidence of necrotizing fasciitis, emergently managed in the OR with critical care, general surgery with I&D.  Patient was in ICU needing intubation and vasopressors for septic shock underwent repeat I&D and diverting colostomy.  She has been extubated and off vasopressors.  Hydrotherapy was started.  Remains in the stepdown unit and remains critically ill with multiple medical issues.  Significant events 6/06 admission 6/08 ICU admission to OR ofr I&D, back intubated and on pressors, 6/09 started tube feeds. Still pressor dependent,kept on vent w/ anticipated return to OR planned  6/10 Back to the OR for significant debridement and also placement of diverting colostomy 6/11 Sedated, low-dose phenylephrine. CCS felt wound looking good. Sedation changed to precedex / PRN fent  6/12 more awake, on 15/5 pressure support 6/13 family discussion >> DNR, continue medical care 6//15 transfuse 1 unit PRBC; vomiting, tube feeds held 6/16 Vomiting overnight 6/17 Trial PSV 15/5 with variable Vt, remains on vasopressors. Vomiting overnight  6/18 transfuse 1 unit PRBC 6/20 Off pressors, On precedex and weaning on PSV  6/21 Weaning on PSV, planned hydrotherapy 6/23 Extubated 6/24 Transferred to [facility] service.  Subjective:  Seen this morning overnight NG tube somehow got pulled out and reinserted awaiting to confirm on x-ray after adjusting. Patient on room air alert awake. Swelling appears somewhat better after initiating diuresis having good urine output: 2175 ml. Erythema generalized is improving. Afebrile and leukocytosis downtrending today.  Assessment & Plan:  Necrotizing fasciitis of pelvic region and thigh: Emergent operative  intervention with incision and debridement and was treated with IV antibiotics, diverting loop colostomy, Foley catheter to aid healing and receiving hydrotherapy.  Appreciate surgery input on board.  Continue to manage wound, continue to address nutritional status, diet tolerance, NG tube feeding.    Septic shock secondary to necrotizing fasciitis needing ICU admissions intubation pressors.  Septic shock is resolved.  Hemodynamically stable.  Patient remains intermittently hypotensive and sinus tachycardic.  Patient has completed multiple course of antibiotics on 6/22 including linezolid clindamycin Flagyl.  Wound grew E. coli, VRE.  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  Leukocytosis: WBC downtrending.  Afebrile.  Monitor for any signs of infection.   Recent Labs  Lab 01/12/20 0848 01/13/20 1647 01/14/20 0420 01/15/20 0245 01/16/20 0552  WBC 11.6* 13.0* 13.7* 16.7* 12.5*   Hypokalemia/hypomagnesemia: k- 3.3 we will keep her on a scheduled 20 M EQ KCl twice daily while on diuresis , mag improved to 1.8.   Recent Labs  Lab 01/12/20 1719 01/13/20 0200 01/14/20 0420 01/15/20 0245 01/16/20 0552  K 3.2* 3.8 3.5 3.3* 3.3*   Acute hypoxic respiratory failure: Initially intubated and subsequently extubated. Currently on room air.  Severe protein calorie malnutrition with low albumin, anasarca with poor nutritional status due to severe illness.  Continue to address her nutritional status with NG tube, dietitian input, monitor electrolytes.   overnight NG tube got dislodged, reconfirming and initiating tube feeding today.  Fluid overload/anasarca: Noted significant gain in the weight in the setting of volume resuscitation/antibiotics IV fluids, hypoalbuminemia.  On admission weight  was 193  to 213 pounds, on 7/1 256.1 lb and started on lasix 20 mg iv bid- wt now 255.9.continue gentle diuresis as blood pressure tolerates and monitor weight.  Monitor intake output.   Pressure injury/DTI of right ear not present on admission.  Continue local supportive care and dressing.  AKI with unknown baseline 1.7 upon admission, AKI has resolved.  Monitor renal function while on diuresis  Intermittent hypo-glycemia blood sugar 66, hopefully we can initiate tube feeding soon.  Not on insulin.  Monitor closely.  Anemia of critical illness/acute blood loss anemia Baseline hemoglobin 12-13 developed anemia in the setting of infection on, critical illness, bleeding, receive 3 units of PRBC so far.  Patient has rectal bleeding GI consulted and performed bedside flexible sigmoidoscopy significant for 10 cm Stercoral ulcer 6/28 .  Will monitor CBC and transfuse for hemoglobin less than 7 g.  Continue mesalamine suppository for 10 days.    Morbid obesity with BMI 43.9.  Will benefit with weight loss and healthy lifestyle once acute issue resolves on outpatient basis  Tachycardia sinus in the setting of pain, anemia, wound multiple issues.  Heart rate intermittently high in 1 teens to 120s.  Monitor closely. She had CT of the chest that was negative for PE right lower leg is inflamed and duplex are negative for DVT.  Monitor closely.  Erythema generalized: likely from use of Purple WIPE by patient's family and patient family has been educated on not to do anything without asking patient's nurses.  No significant itching.  Monitor closely.  Redness seems to be improving today.  DVT prophylaxis: Place and maintain sequential compression device Start: 01/10/20 1738.  Lovenox stopped due to bleeding from the wound Code Status: full Family Communication: plan of care discussed with patient at bedside.  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  Dispo: The patient is from: Home              Anticipated d/c is to: LTAC              Anticipated d/c date is: > 3 days              Patient currently is not medically stable to d/c.  Nutrition: Nutrition and speech are following. Diet Order            Diet full liquid Room service appropriate? Yes; Fluid consistency: Nectar Thick  Diet effective now                 Nutrition Problem: Increased nutrient needs Etiology: chronic illness, wound healing (alcohol abuse; necrotizing fasciitis) Signs/Symptoms: estimated needs Interventions: Refer to RD note for recommendations Body mass index is 43.93 kg/m. Pressure Ulcer: Pressure Injury 12/27/19 Ear Right Deep Tissue Pressure Injury - Purple or maroon localized area of discolored intact skin or blood-filled blister due to damage of underlying soft tissue from pressure and/or shear. (Active)  12/27/19 1200  Location: Ear  Location Orientation: Right  Staging: Deep Tissue Pressure Injury - Purple or maroon localized area of discolored intact skin or blood-filled blister due to damage of underlying soft tissue from pressure and/or shear.  Wound Description (Comments):   Present on Admission: No   Consultants:see note  Procedures:  ENDOTRACHEAL INTUBATION (6/8 >> 01/07/2020)  INCISION AND DEBRIDEMENT OF LEFT BUTTOCK AND PERINEUM (12/23/2019)  IRRIGATION AND DEBRIDEMENT OF BUTTOCKS;  LAPAROSCOPIC LOOP COLOSTOMY (12/25/2019)  FLEXIBLE SIGMOIDOSCOPY (01/11/2020)   Microbiology:see note  Medications: Scheduled Meds: . sodium chloride   Intravenous Once  . ascorbic acid  500 mg Per Tube BID  . chlorhexidine  15 mL Mouth Rinse BID  . Chlorhexidine Gluconate Cloth  6 each Topical Daily  . collagenase   Topical Daily  . ergocalciferol  50,000 Units Per Tube Q Fri  . feeding supplement (PIVOT 1.5 CAL)   1,000 mL Per Tube Q24H  . feeding supplement (PRO-STAT SUGAR FREE 64)  30 mL Per Tube Daily  . ferrous sulfate  300 mg Per Tube BID WC  . folic acid  1 mg Per Tube Daily  . furosemide  20 mg Intravenous BID  . insulin aspart  0-6 Units Subcutaneous Q4H  . insulin aspart  4 Units Subcutaneous Q4H  . mouth rinse  15 mL Mouth Rinse q12n4p  . mesalamine  500 mg Rectal QHS  . multivitamin with minerals  1 tablet Per Tube Daily  . pantoprazole sodium  40 mg Per Tube BID  . potassium chloride  20 mEq Oral BID  . sodium chloride flush  10-40 mL Intracatheter Q12H  . thiamine  100 mg Per Tube Daily  . vitamin A  20,000 Units Oral Daily  . zinc sulfate  220 mg Oral Daily   Continuous Infusions: . sodium chloride      Antimicrobials:  Ceftriaxone  Vancomycin  Clindamycin  Linezolid  Flagyl  Finished all antibiotic therapy. Anti-infectives (From admission, onward)   Start     Dose/Rate Route Frequency Ordered Stop   12/31/19 1600  metroNIDAZOLE (FLAGYL) IVPB 500 mg  Status:  Discontinued        500 mg 100 mL/hr over 60 Minutes Intravenous Every 8 hours 12/31/19 1505 01/06/20 0859   12/28/19 1400  vancomycin (VANCOCIN) IVPB 1000 mg/200 mL premix  Status:  Discontinued        1,000 mg 200 mL/hr over 60 Minutes Intravenous Every 24 hours 12/28/19 1005 12/28/19 1343   12/28/19 1400  linezolid (ZYVOX) IVPB 600 mg        600 mg 300 mL/hr over 60 Minutes Intravenous Every 12 hours 12/28/19 1343 01/06/20 1954   12/26/19 1948  vancomycin variable dose per unstable renal function (pharmacist dosing)  Status:  Discontinued         Does not apply See admin instructions 12/26/19 1948 12/28/19 1005   12/23/19 2000  vancomycin (VANCOCIN) IVPB 1000 mg/200 mL premix  Status:  Discontinued        1,000 mg 200 mL/hr over 60 Minutes Intravenous Every 8 hours 12/23/19 1027 12/26/19 1944   12/23/19 1300  clindamycin (CLEOCIN) IVPB 900 mg  Status:  Discontinued        900 mg 100 mL/hr over 30  Minutes Intravenous Every 8 hours 12/23/19 1205 12/31/19 1505   12/23/19 1200  metroNIDAZOLE (FLAGYL) IVPB 500 mg  Status:  Discontinued        500 mg 100 mL/hr over 60 Minutes Intravenous Every 8 hours 12/23/19 1135 12/23/19 1254   12/23/19 1045  vancomycin (VANCOREADY) IVPB 2000 mg/400 mL        2,000 mg 200 mL/hr over 120 Minutes Intravenous NOW 12/23/19 1018 12/23/19 1441   12/23/19 0300  cefTRIAXone (ROCEPHIN) 2 g in sodium chloride 0.9 % 100 mL IVPB  Status:  Discontinued        2 g 200 mL/hr over 30 Minutes Intravenous Every 24 hours 12/23/19 0236 01/06/20 0859  Objective: Vitals: Today's Vitals   01/16/20 0500 01/16/20 0600 01/16/20 0756 01/16/20 0800  BP:  124/75  126/80  Pulse:      Resp:  (!) 25  (!) 23  Temp:   (!) 97.5 F (36.4 C)   TempSrc:   Axillary   SpO2: 100% 100%  100%  Weight: 116.1 kg     Height:      PainSc:        Intake/Output Summary (Last 24 hours) at 01/16/2020 8786 Last data filed at 01/16/2020 0800 Gross per 24 hour  Intake 1862.42 ml  Output 3675 ml  Net -1812.58 ml   Filed Weights   01/14/20 0500 01/15/20 0302 01/16/20 0500  Weight: 118 kg 116.2 kg 116.1 kg   Weight change: -0.1 kg   Intake/Output from previous day: 07/01 0701 - 07/02 0700 In: 1862.4 [NG/GT:1820; IV Piggyback:42.4] Out: 3225 [Urine:2175; Stool:1050] Intake/Output this shift: Total I/O In: -  Out: 450 [Urine:450]  Examination:  General exam: AAO,obese, on room air, appears older for age HEENT:Oral mucosa moist, Ear/Nose WNL grossly, dentition normal. Respiratory system: bilaterally diminished breath sound,no wheezing or crackles,no use of accessory muscle Cardiovascular system: S1 & S2 +, No JVD,. Gastrointestinal system: Abdomen soft, left-sided colostomy bag with stool in place, drain on the right side from the skin, bowel sounds present, Foley present , ND, BS+ Nervous System:Alert, awake, moving extremities and grossly nonfocal Extremities: Bilateral  upper and lower extremity edema present , skin erythema present.  Skin: No rashes,no icterus. MSK: Normal muscle bulk,tone, power   Data Reviewed: I have personally reviewed following labs and imaging studies CBC: Recent Labs  Lab 01/12/20 0848 01/13/20 1647 01/14/20 0420 01/15/20 0245 01/16/20 0552  WBC 11.6* 13.0* 13.7* 16.7* 12.5*  NEUTROABS  --   --  10.8* 13.4* 9.0*  HGB 8.6* 6.8* 8.6* 8.6* 7.9*  HCT 28.0* 22.4* 27.9* 27.9* 26.0*  MCV 102.6* 104.2* 95.5 95.2 97.4  PLT 474* 424* 398 380 767   Basic Metabolic Panel: Recent Labs  Lab 01/12/20 1719 01/13/20 0200 01/13/20 1840 01/14/20 0420 01/15/20 0245 01/16/20 0552  NA 142 141  --  139 142 140  K 3.2* 3.8  --  3.5 3.3* 3.3*  CL 112* 111  --  109 109 107  CO2 24 23  --  26 25 28   GLUCOSE 113* 132*  --  139* 138* 93  BUN 5* 7  --  9 13 13   CREATININE 0.33* 0.32*  --  <0.30* <0.30* <0.30*  CALCIUM 7.6* 7.6*  --  7.6* 7.4* 7.8*  MG 1.4* 1.8 1.7  --  1.5* 1.8  PHOS 3.9 3.6 3.0  --  2.1* 2.5   GFR: CrCl cannot be calculated (This lab value cannot be used to calculate CrCl because it is not a number: <0.30). Liver Function Tests: Recent Labs  Lab 01/13/20 0200 01/15/20 0245 01/16/20 0552  AST 29 20 22   ALT 15 11 13   ALKPHOS 127* 93 92  BILITOT 0.6 0.7 0.3  PROT 4.9* 4.3* 4.8*  ALBUMIN 1.2* 1.1* 1.2*   No results for input(s): LIPASE, AMYLASE in the last 168 hours. No results for input(s): AMMONIA in the last 168 hours. Coagulation Profile: Recent Labs  Lab 01/11/20 0343  INR 1.2   Cardiac Enzymes: No results for input(s): CKTOTAL, CKMB, CKMBINDEX, TROPONINI in the last 168 hours. BNP (last 3 results) No results for input(s): PROBNP in the last 8760 hours. HbA1C: No results for input(s): HGBA1C in the last  72 hours. CBG: Recent Labs  Lab 01/15/20 1605 01/15/20 1955 01/15/20 2327 01/16/20 0417 01/16/20 0735  GLUCAP 104* 77 56* 104* 66*   Lipid Profile: No results for input(s): CHOL, HDL,  LDLCALC, TRIG, CHOLHDL, LDLDIRECT in the last 72 hours. Thyroid Function Tests: No results for input(s): TSH, T4TOTAL, FREET4, T3FREE, THYROIDAB in the last 72 hours. Anemia Panel: No results for input(s): VITAMINB12, FOLATE, FERRITIN, TIBC, IRON, RETICCTPCT in the last 72 hours. Sepsis Labs: No results for input(s): PROCALCITON, LATICACIDVEN in the last 168 hours.  No results found for this or any previous visit (from the past 240 hour(s)).    Radiology Studies: DG Abd 1 View  Result Date: 01/16/2020 CLINICAL DATA:  Nasogastric tube placement EXAM: ABDOMEN - 1 VIEW COMPARISON:  01/12/2020 FINDINGS: Weighted feeding tube with tip at the proximal duodenum. Distally, the tube is kinked in the setting of indwelling wire. The visualized bowel gas pattern is nonobstructive IMPRESSION: Feeding tube with tip at the proximal duodenum. Electronically Signed   By: Monte Fantasia M.D.   On: 01/16/2020 05:17   VAS [place] LOWER EXTREMITY VENOUS (DVT)  Result Date: 01/14/2020  Lower Venous DVTStudy Indications: Swelling.  Risk Factors: None identified. Limitations: Body habitus, poor ultrasound/tissue interface and patient positioning, patient immobility. Comparison Study: No prior studies. Performing Technologist: Oliver Hum RVT  Examination Guidelines: A complete evaluation includes B-mode imaging, spectral Doppler, color Doppler, and power Doppler as needed of all accessible portions of each vessel. Bilateral testing is considered an integral part of a complete examination. Limited examinations for reoccurring indications may be performed as noted. The reflux portion of the exam is performed with the patient in reverse Trendelenburg.  +---------+---------------+---------+-----------+----------+--------------+ RIGHT    CompressibilityPhasicitySpontaneityPropertiesThrombus Aging +---------+---------------+---------+-----------+----------+--------------+ CFV      Full           Yes      Yes                                  +---------+---------------+---------+-----------+----------+--------------+ SFJ      Full                                                        +---------+---------------+---------+-----------+----------+--------------+ FV Prox  Full                                                        +---------+---------------+---------+-----------+----------+--------------+ FV Mid                  Yes      Yes                                 +---------+---------------+---------+-----------+----------+--------------+ FV Distal               Yes      Yes                                 +---------+---------------+---------+-----------+----------+--------------+ PFV      Full                                                        +---------+---------------+---------+-----------+----------+--------------+  POP      Full           Yes      Yes                                 +---------+---------------+---------+-----------+----------+--------------+ PTV      Full                                                        +---------+---------------+---------+-----------+----------+--------------+ PERO     Full                                                        +---------+---------------+---------+-----------+----------+--------------+   +---------+---------------+---------+-----------+----------+--------------+ LEFT     CompressibilityPhasicitySpontaneityPropertiesThrombus Aging +---------+---------------+---------+-----------+----------+--------------+ CFV      Full           Yes      Yes                                 +---------+---------------+---------+-----------+----------+--------------+ SFJ      Full                                                        +---------+---------------+---------+-----------+----------+--------------+ FV Prox  Full                                                         +---------+---------------+---------+-----------+----------+--------------+ FV Mid                  Yes      Yes                                 +---------+---------------+---------+-----------+----------+--------------+ FV Distal               Yes      Yes                                 +---------+---------------+---------+-----------+----------+--------------+ PFV      Full                                                        +---------+---------------+---------+-----------+----------+--------------+ POP      Full           Yes      Yes                                 +---------+---------------+---------+-----------+----------+--------------+  PTV                                                   Not visualized +---------+---------------+---------+-----------+----------+--------------+ PERO                                                  Not visualized +---------+---------------+---------+-----------+----------+--------------+     Summary: RIGHT: - There is no evidence of deep vein thrombosis in the lower extremity. However, portions of this examination were limited- see technologist comments above.  - No cystic structure found in the popliteal fossa.  LEFT: - There is no evidence of deep vein thrombosis in the lower extremity. However, portions of this examination were limited- see technologist comments above.  - No cystic structure found in the popliteal fossa.  *See table(s) above for measurements and observations. Electronically signed by Harold Barban MD on 01/14/2020 at 8:50:45 PM.    Final      LOS: 63 days   Antonieta Pert, MD [facility]  01/16/2020, 8:33 AM

## 2020-01-17 ENCOUNTER — Inpatient Hospital Stay (HOSPITAL_COMMUNITY): Payer: Medicaid Other

## 2020-01-17 LAB — CBC WITH DIFFERENTIAL/PLATELET
Abs Immature Granulocytes: 0.05 10*3/uL (ref 0.00–0.07)
Basophils Absolute: 0 10*3/uL (ref 0.0–0.1)
Basophils Relative: 0 %
Eosinophils Absolute: 0.3 10*3/uL (ref 0.0–0.5)
Eosinophils Relative: 3 %
HCT: 25.5 % — ABNORMAL LOW (ref 36.0–46.0)
Hemoglobin: 7.7 g/dL — ABNORMAL LOW (ref 12.0–15.0)
Immature Granulocytes: 1 %
Lymphocytes Relative: 20 %
Lymphs Abs: 2.1 10*3/uL (ref 0.7–4.0)
MCH: 29.4 pg (ref 26.0–34.0)
MCHC: 30.2 g/dL (ref 30.0–36.0)
MCV: 97.3 fL (ref 80.0–100.0)
Monocytes Absolute: 1 10*3/uL (ref 0.1–1.0)
Monocytes Relative: 10 %
Neutro Abs: 7.1 10*3/uL (ref 1.7–7.7)
Neutrophils Relative %: 66 %
Platelets: 341 10*3/uL (ref 150–400)
RBC: 2.62 MIL/uL — ABNORMAL LOW (ref 3.87–5.11)
RDW: 24.8 % — ABNORMAL HIGH (ref 11.5–15.5)
WBC: 10.6 10*3/uL — ABNORMAL HIGH (ref 4.0–10.5)
nRBC: 0 % (ref 0.0–0.2)

## 2020-01-17 LAB — GLUCOSE, CAPILLARY
Glucose-Capillary: 108 mg/dL — ABNORMAL HIGH (ref 70–99)
Glucose-Capillary: 98 mg/dL (ref 70–99)
Glucose-Capillary: 81 mg/dL (ref 70–99)
Glucose-Capillary: 108 mg/dL — ABNORMAL HIGH (ref 70–99)
Glucose-Capillary: 83 mg/dL (ref 70–99)
Glucose-Capillary: 106 mg/dL — ABNORMAL HIGH (ref 70–99)

## 2020-01-17 LAB — COMPREHENSIVE METABOLIC PANEL
ALT: 14 U/L (ref 0–44)
AST: 27 U/L (ref 15–41)
Albumin: 1.3 g/dL — ABNORMAL LOW (ref 3.5–5.0)
Alkaline Phosphatase: 96 U/L (ref 38–126)
Anion gap: 10 (ref 5–15)
BUN: 12 mg/dL (ref 6–20)
CO2: 29 mmol/L (ref 22–32)
Calcium: 7.9 mg/dL — ABNORMAL LOW (ref 8.9–10.3)
Chloride: 99 mmol/L (ref 98–111)
Creatinine, Ser: <0.3 mg/dL — ABNORMAL LOW (ref 0.44–1.00)
Glucose, Bld: 110 mg/dL — ABNORMAL HIGH (ref 70–99)
Potassium: 3.6 mmol/L (ref 3.5–5.1)
Sodium: 138 mmol/L (ref 135–145)
Total Bilirubin: 0.5 mg/dL (ref 0.3–1.2)
Total Protein: 5.2 g/dL — ABNORMAL LOW (ref 6.5–8.1)

## 2020-01-17 NOTE — Progress Notes (Signed)
PROGRESS NOTE    Alyssa Carey  ZOX:[id number] DOB: 04-09-1971 DOA: 12/21/2019 PCP: Patient, No Pcp Per   Chef Complaints:: Alcohol withdrawal   Brief Narrative: 49 year old female who presented secondary to concern for alcohol withdrawal and quickly developed evidence of necrotizing fasciitis, emergently managed in the OR with critical care, general surgery with I&D.  Patient was in ICU needing intubation and vasopressors for septic shock underwent repeat I&D and diverting colostomy.  She has been extubated and off vasopressors.  Hydrotherapy was started.  Remains in the stepdown unit and remains critically ill with multiple medical issues.  Significant events 6/06 admission 6/08 ICU admission to OR ofr I&D, back intubated and on pressors, 6/09 started tube feeds. Still pressor dependent,kept on vent w/ anticipated return to OR planned  6/10 Back to the OR for significant debridement and also placement of diverting colostomy 6/11 Sedated, low-dose phenylephrine. CCS felt wound looking good. Sedation changed to precedex / PRN fent  6/12 more awake, on 15/5 pressure support 6/13 family discussion >> DNR, continue medical care 6//15 transfuse 1 unit PRBC; vomiting, tube feeds held 6/16 Vomiting overnight 6/17 Trial PSV 15/5 with variable Vt, remains on vasopressors. Vomiting overnight  6/18 transfuse 1 unit PRBC 6/20 Off pressors, On precedex and weaning on PSV  6/21 Weaning on PSV, planned hydrotherapy 6/23 Extubated 6/24 Transferred to [facility] service.  Subjective:  No acute events overnight.  Afebrile.  Intermittent tachycardia. WBC count decreasing.  Nurse reports that her NG tube got dislodged this morning and working on replacement  good Urine output 3850 ml, colostomy output 525 ml past 24 hrs  Assessment & Plan:  Necrotizing fasciitis of pelvic region and thigh: Status post emergent operative intervention with incision and debridement and was treated with IV antibiotics,  diverting loop colostomy, Foley catheter to aid healing.  Appreciate surgery input on board were following intermittently patient to continue on hydrotherapy Monday Wednesday Friday, sentinel, with ongoing wound healing, slowly.Improve nutritional status, encourage ambulation.   Septic shock secondary to necrotizing fasciitis needing ICU admissions intubation pressors.  Septic shock resolved, hemodynamically stable.  Intermittently tachycardic,completed multiple course of antibiotics on 6/22 including linezolid clindamycin Flagyl.  Wound grew E. coli, VRE.  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 well.  Leukocytosis: Afebrile, WBC count is decreasing.  Monitor. Recent Labs  Lab 01/13/20 1647 01/14/20 0420 01/15/20 0245 01/16/20 0552 01/17/20 0242  WBC 13.0* 13.7* 16.7* 12.5* 10.6*   Hypokalemia/hypomagnesemia: Potassium currently stable, continue scheduled 20 M EQ KCl twice daily while on diuresis , mag stable.   Recent Labs  Lab 01/13/20 0200 01/14/20 0420 01/15/20 0245 01/16/20 0552 01/17/20 0242  K 3.8 3.5 3.3* 3.3* 3.6   Acute hypoxic respiratory failure: Initially intubated and subsequently extubated. Currently on room air.  Severe protein calorie malnutrition with low albumin, anasarca with poor nutritional status due to severe illness.  Continue to address her nutritional status with NG tube, speech and dietitian follow-up.  Continue to monitor for tube feed tolerance.   Fluid overload/anasarca: Noted significant gain in the weight in the setting of volume resuscitation/antibiotics IV fluids, hypoalbuminemia.  On admission weight was 193 to 213 pounds, on 7/1 256.1 lb and started on lasix 20  mg iv bid- wt  now at 248 lb, improving. Cont on iv lasix. Monitor I/o and bun/creat   Pressure injury/DTI of right ear not present on admission.  Continue local supportive care and dressing.  AKI with unknown baseline 1.7 upon admission, AKI has resolved.  Monitor renal function while on diuresis Recent Labs  Lab 01/13/20 0200 01/14/20 0420 01/15/20 0245 01/16/20 0552 01/17/20 0242  BUN 7 9 13 13 12   CREATININE 0.32* <0.30* <0.30* <0.30* <0.30*   Intermittent hypo-glycemia: Blood sugar has improved.  Monitor.  Recent Labs  Lab 01/16/20 1610 01/16/20 1937 01/16/20 2331 01/17/20 0331 01/17/20 0834  GLUCAP 104* 99 105* 108* 98   Anemia of critical illness/acute blood loss anemia Baseline hemoglobin 12-13 developed anemia in the setting of infection on, critical illness, bleeding, receive 3 units of PRBC so far.  Patient has rectal bleeding GI consulted and performed bedside flexible sigmoidoscopy significant for 10 cm Stercoral ulcer 6/28 .  Will monitor CBC and transfuse for hemoglobin less than 7 g.  Continue mesalamine suppository for 10 days.    Morbid obesity with BMI 43.9.  Will benefit with weight loss and healthy lifestyle once acute issue resolves on outpatient basis  Tachycardia sinus in the setting of pain, anemia, wound multiple issues.  Heart rate intermittently high in 1 teens to 120s.  Monitor closely. She had CT of the chest that was negative for PE right lower leg is inflamed and duplex are negative for DVT.  Monitor closely.  Also noted to have swelling more on the left upper extremity than the right duplex was obtained 7/2 and negative for DVT-slowly improving with diuresis.    Erythema generalized: likely from use of Purple Wipe by patient's family and patient's family has been educated on not to do anything without asking patient's nurses.  No significant itching.  Monitor closely.  Erythema improving.  DVT prophylaxis: Place and maintain sequential compression  device Start: 01/10/20 1738.  Lovenox stopped due to bleeding from the wound Code Status: full Family Communication: plan of care discussed with patient and nursing staffs at bedside.  Status is: Inpatient  Remains inpatient appropriate because:Inpatient level of care appropriate due to severity of illness and For ongoing management wound, postop, multiple issues, poor oral intake Case manager working on [employer].  Dispo: The patient is from: Home              Anticipated d/c is to: LTAC vs SNF- but still needing NGT for nutrition- will be difficult SNF disposition.              Anticipated d/c date is: > 3 days              Patient currently is not medically stable to d/c.  Nutrition: Nutrition and speech are following. Diet Order            Diet full liquid Room service appropriate? Yes; Fluid consistency: Nectar Thick  Diet effective now                 Nutrition Problem: Increased nutrient needs Etiology: chronic illness, wound healing (alcohol abuse; necrotizing fasciitis) Signs/Symptoms: estimated needs Interventions: Tube feeding, Prostat Body mass index is 42.65 kg/m. Pressure Ulcer: Pressure Injury 12/27/19 Ear Right Deep Tissue Pressure Injury - Purple or maroon localized area of discolored intact skin or blood-filled blister due to damage of underlying soft tissue from pressure and/or shear. (Active)  12/27/19 1200  Location: Ear  Location Orientation: Right  Staging: Deep Tissue Pressure Injury - Purple  or maroon localized area of discolored intact skin or blood-filled blister due to damage of underlying soft tissue from pressure and/or shear.  Wound Description (Comments):   Present on Admission: No   Consultants:see note  Procedures:  ENDOTRACHEAL INTUBATION (6/8 >> 01/07/2020)  INCISION AND DEBRIDEMENT OF LEFT BUTTOCK AND PERINEUM (12/23/2019)  IRRIGATION AND DEBRIDEMENT OF BUTTOCKS; LAPAROSCOPIC LOOP COLOSTOMY (12/25/2019)  FLEXIBLE SIGMOIDOSCOPY (01/11/2020)    Microbiology:see note  Medications: Scheduled Meds: . sodium chloride   Intravenous Once  . ascorbic acid  500 mg Per Tube BID  . chlorhexidine  15 mL Mouth Rinse BID  . Chlorhexidine Gluconate Cloth  6 each Topical Daily  . collagenase   Topical Daily  . ergocalciferol  50,000 Units Per Tube Q Fri  . feeding supplement (PIVOT 1.5 CAL)  1,000 mL Per Tube Q24H  . feeding supplement (PRO-STAT SUGAR FREE 64)  30 mL Per Tube Daily  . ferrous sulfate  300 mg Per Tube BID WC  . folic acid  1 mg Per Tube Daily  . furosemide  20 mg Intravenous BID  . insulin aspart  0-6 Units Subcutaneous Q4H  . insulin aspart  4 Units Subcutaneous Q4H  . mouth rinse  15 mL Mouth Rinse q12n4p  . mesalamine  500 mg Rectal QHS  . multivitamin with minerals  1 tablet Per Tube Daily  . pantoprazole sodium  40 mg Per Tube BID  . potassium chloride  20 mEq Oral BID  . sodium chloride flush  10-40 mL Intracatheter Q12H  . thiamine  100 mg Per Tube Daily  . vitamin A  20,000 Units Oral Daily  . zinc sulfate  220 mg Oral Daily   Continuous Infusions: . sodium chloride      Antimicrobials:  Ceftriaxone  Vancomycin  Clindamycin  Linezolid  Flagyl  Finished all antibiotic therapy. Anti-infectives (From admission, onward)   Start     Dose/Rate Route Frequency Ordered Stop   12/31/19 1600  metroNIDAZOLE (FLAGYL) IVPB 500 mg  Status:  Discontinued        500 mg 100 mL/hr over 60 Minutes Intravenous Every 8 hours 12/31/19 1505 01/06/20 0859   12/28/19 1400  vancomycin (VANCOCIN) IVPB 1000 mg/200 mL premix  Status:  Discontinued        1,000 mg 200 mL/hr over 60 Minutes Intravenous Every 24 hours 12/28/19 1005 12/28/19 1343   12/28/19 1400  linezolid (ZYVOX) IVPB 600 mg        600 mg 300 mL/hr over 60 Minutes Intravenous Every 12 hours 12/28/19 1343 01/06/20 1954   12/26/19 1948  vancomycin variable dose per unstable renal function (pharmacist dosing)  Status:  Discontinued         Does not apply  See admin instructions 12/26/19 1948 12/28/19 1005   12/23/19 2000  vancomycin (VANCOCIN) IVPB 1000 mg/200 mL premix  Status:  Discontinued        1,000 mg 200 mL/hr over 60 Minutes Intravenous Every 8 hours 12/23/19 1027 12/26/19 1944   12/23/19 1300  clindamycin (CLEOCIN) IVPB 900 mg  Status:  Discontinued        900 mg 100 mL/hr over 30 Minutes Intravenous Every 8 hours 12/23/19 1205 12/31/19 1505   12/23/19 1200  metroNIDAZOLE (FLAGYL) IVPB 500 mg  Status:  Discontinued        500 mg 100 mL/hr over 60 Minutes Intravenous Every 8 hours 12/23/19 1135 12/23/19 1254   12/23/19 1045  vancomycin (VANCOREADY) IVPB 2000 mg/400 mL  2,000 mg 200 mL/hr over 120 Minutes Intravenous NOW 12/23/19 1018 12/23/19 1441   12/23/19 0300  cefTRIAXone (ROCEPHIN) 2 g in sodium chloride 0.9 % 100 mL IVPB  Status:  Discontinued        2 g 200 mL/hr over 30 Minutes Intravenous Every 24 hours 12/23/19 0236 01/06/20 0859       Objective: Vitals: Today's Vitals   01/17/20 0600 01/17/20 0700 01/17/20 0750 01/17/20 0800  BP: 140/84 138/81  (!) 141/92  Pulse: (!) 119 (!) 109  (!) 113  Resp:  (!) 26  (!) 24  Temp:      TempSrc:      SpO2: 97% 100%  100%  Weight:      Height:      PainSc:   Asleep     Intake/Output Summary (Last 24 hours) at 01/17/2020 0857 Last data filed at 01/17/2020 0750 Gross per 24 hour  Intake 1330.83 ml  Output 3925 ml  Net -2594.17 ml   Filed Weights   01/15/20 0302 01/16/20 0500 01/17/20 0500  Weight: 116.2 kg 116.1 kg 112.7 kg   Weight change: -3.4 kg   Intake/Output from previous day: 07/02 0701 - 07/03 0700 In: 660 [NG/GT:660] Out: 4375 [Urine:3850; Stool:525] Intake/Output this shift: Total I/O In: 670.8 [NG/GT:670.8] Out: -   Examination:  General exam: AAO, speaks is low/muffled voice, abel to interact and communicate, on RA HEENT:Oral mucosa moist, Ear/Nose WNL grossly, dentition normal. Respiratory system: bilaterally diminished,no wheezing or  crackles,no use of accessory muscle Cardiovascular system: S1 & S2 +, No JVD,. Gastrointestinal system: Abdomen soft, NT,ND, BS+ Nervous System:Alert, awake, moving extremities and grossly nonfocal Extremities: b/l non pitting edemea in LE and LUE> RUE, Skin: No rashes,no icterus. MSK: Normal muscle bulk,tone, power LUE PICC+ LLQ Colostomy + with stool Foley+ RLQ skin drainage- scant  Buttock wound+    Data Reviewed: I have personally reviewed following labs and imaging studies CBC: Recent Labs  Lab 01/13/20 1647 01/14/20 0420 01/15/20 0245 01/16/20 0552 01/17/20 0242  WBC 13.0* 13.7* 16.7* 12.5* 10.6*  NEUTROABS  --  10.8* 13.4* 9.0* 7.1  HGB 6.8* 8.6* 8.6* 7.9* 7.7*  HCT 22.4* 27.9* 27.9* 26.0* 25.5*  MCV 104.2* 95.5 95.2 97.4 97.3  PLT 424* 398 380 330 962   Basic Metabolic Panel: Recent Labs  Lab 01/12/20 1719 01/12/20 1719 01/13/20 0200 01/13/20 1840 01/14/20 0420 01/15/20 0245 01/16/20 0552 01/17/20 0242  NA 142   < > 141  --  139 142 140 138  K 3.2*   < > 3.8  --  3.5 3.3* 3.3* 3.6  CL 112*   < > 111  --  109 109 107 99  CO2 24   < > 23  --  26 25 28 29   GLUCOSE 113*   < > 132*  --  139* 138* 93 110*  BUN 5*   < > 7  --  9 13 13 12   CREATININE 0.33*   < > 0.32*  --  <0.30* <0.30* <0.30* <0.30*  CALCIUM 7.6*   < > 7.6*  --  7.6* 7.4* 7.8* 7.9*  MG 1.4*  --  1.8 1.7  --  1.5* 1.8  --   PHOS 3.9  --  3.6 3.0  --  2.1* 2.5  --    < > = values in this interval not displayed.   GFR: CrCl cannot be calculated (This lab value cannot be used to calculate CrCl because it is not a number: <  0.30). Liver Function Tests: Recent Labs  Lab 01/13/20 0200 01/15/20 0245 01/16/20 0552 01/17/20 0242  AST 29 20 22 27   ALT 15 11 13 14   ALKPHOS 127* 93 92 96  BILITOT 0.6 0.7 0.3 0.5  PROT 4.9* 4.3* 4.8* 5.2*  ALBUMIN 1.2* 1.1* 1.2* 1.3*   No results for input(s): LIPASE, AMYLASE in the last 168 hours. No results for input(s): AMMONIA in the last 168 hours.  Coagulation Profile: Recent Labs  Lab 01/11/20 0343  INR 1.2   Cardiac Enzymes: No results for input(s): CKTOTAL, CKMB, CKMBINDEX, TROPONINI in the last 168 hours. BNP (last 3 results) No results for input(s): PROBNP in the last 8760 hours. HbA1C: No results for input(s): HGBA1C in the last 72 hours. CBG: Recent Labs  Lab 01/16/20 1610 01/16/20 1937 01/16/20 2331 01/17/20 0331 01/17/20 0834  GLUCAP 104* 99 105* 108* 98   Lipid Profile: No results for input(s): CHOL, HDL, LDLCALC, TRIG, CHOLHDL, LDLDIRECT in the last 72 hours. Thyroid Function Tests: No results for input(s): TSH, T4TOTAL, FREET4, T3FREE, THYROIDAB in the last 72 hours. Anemia Panel: No results for input(s): VITAMINB12, FOLATE, FERRITIN, TIBC, IRON, RETICCTPCT in the last 72 hours. Sepsis Labs: No results for input(s): PROCALCITON, LATICACIDVEN in the last 168 hours.  No results found for this or any previous visit (from the past 240 hour(s)).    Radiology Studies: DG Abd 1 View  Result Date: 01/16/2020 CLINICAL DATA:  Feeding tube replacement. EXAM: ABDOMEN - 1 VIEW COMPARISON:  Same day. FINDINGS: The bowel gas pattern is normal. Feeding tube is noted which is looped within the proximal stomach, with the distal tip in the most proximal stomach. No radio-opaque calculi or other significant radiographic abnormality are seen. IMPRESSION: Feeding tube is looped within proximal stomach, with distal tip in the most proximal stomach. Electronically Signed   By: Marijo Conception M.D.   On: 01/16/2020 10:48   DG Abd 1 View  Result Date: 01/16/2020 CLINICAL DATA:  Nasogastric tube placement. EXAM: ABDOMEN - 1 VIEW COMPARISON:  January 16, 2020. FINDINGS: The bowel gas pattern is normal. Distal tip of feeding tube is seen in distal stomach which is unchanged compared to prior exam. No radio-opaque calculi or other significant radiographic abnormality are seen. IMPRESSION: Distal tip of feeding tube seen in distal stomach.  Electronically Signed   By: Marijo Conception M.D.   On: 01/16/2020 09:09   DG Abd 1 View  Result Date: 01/16/2020 CLINICAL DATA:  Check gastric catheter placement EXAM: ABDOMEN - 1 VIEW COMPARISON:  Film from earlier in the same day. FINDINGS: Weighted feeding catheter is noted extending into at least the distal stomach and possibly into the proximal duodenum. It is uncertain whether the catheter is kinked upon itself within the distal stomach or proceeds into the proximal duodenum. IMPRESSION: Weighted feeding catheter as described. There is kinking of the catheter distally which may lie within the distal stomach or proximal duodenum. Contrast injection may be helpful for further evaluation. Electronically Signed   By: Inez Catalina M.D.   On: 01/16/2020 08:57   DG Abd 1 View  Result Date: 01/16/2020 CLINICAL DATA:  Nasogastric tube placement EXAM: ABDOMEN - 1 VIEW COMPARISON:  01/12/2020 FINDINGS: Weighted feeding tube with tip at the proximal duodenum. Distally, the tube is kinked in the setting of indwelling wire. The visualized bowel gas pattern is nonobstructive IMPRESSION: Feeding tube with tip at the proximal duodenum. Electronically Signed   By: Neva Seat.D.  On: 01/16/2020 05:17   VAS [place] UPPER EXTREMITY VENOUS DUPLEX  Result Date: 01/16/2020 UPPER VENOUS STUDY  Indications: Swelling Risk Factors: None identified. Limitations: Body habitus, poor ultrasound/tissue interface, bandages and line. Comparison Study: No prior studies. Performing Technologist: Oliver Hum RVT  Examination Guidelines: A complete evaluation includes B-mode imaging, spectral Doppler, color Doppler, and power Doppler as needed of all accessible portions of each vessel. Bilateral testing is considered an integral part of a complete examination. Limited examinations for reoccurring indications may be performed as noted.  Right Findings: +----------+------------+---------+-----------+----------+-------+ RIGHT      CompressiblePhasicitySpontaneousPropertiesSummary +----------+------------+---------+-----------+----------+-------+ Subclavian    Full       Yes       Yes                      +----------+------------+---------+-----------+----------+-------+  Left Findings: +----------+------------+---------+-----------+----------+-------+ LEFT      CompressiblePhasicitySpontaneousPropertiesSummary +----------+------------+---------+-----------+----------+-------+ IJV           Full       Yes       Yes                      +----------+------------+---------+-----------+----------+-------+ Subclavian    Full       Yes       Yes                      +----------+------------+---------+-----------+----------+-------+ Axillary      Full       Yes       Yes                      +----------+------------+---------+-----------+----------+-------+ Brachial      Full       Yes       Yes                      +----------+------------+---------+-----------+----------+-------+ Radial        Full                                          +----------+------------+---------+-----------+----------+-------+ Ulnar         Full                                          +----------+------------+---------+-----------+----------+-------+ Cephalic      Full                                          +----------+------------+---------+-----------+----------+-------+ Basilic       Full                                          +----------+------------+---------+-----------+----------+-------+  Summary:  Right: No evidence of thrombosis in the subclavian.  Left: No evidence of deep vein thrombosis in the upper extremity. No evidence of superficial vein thrombosis in the upper extremity.  *See table(s) above for measurements and observations.  Diagnosing physician: Delanna Martinez MD Electronically signed by Anise Martinez MD on 01/16/2020 at 6:33:16 PM.    Final      LOS:  72 days    Antonieta Pert, MD [facility]  01/17/2020, 8:57 AM

## 2020-01-18 LAB — GLUCOSE, CAPILLARY
Glucose-Capillary: 117 mg/dL — ABNORMAL HIGH (ref 70–99)
Glucose-Capillary: 121 mg/dL — ABNORMAL HIGH (ref 70–99)
Glucose-Capillary: 125 mg/dL — ABNORMAL HIGH (ref 70–99)
Glucose-Capillary: 131 mg/dL — ABNORMAL HIGH (ref 70–99)
Glucose-Capillary: 135 mg/dL — ABNORMAL HIGH (ref 70–99)
Glucose-Capillary: 163 mg/dL — ABNORMAL HIGH (ref 70–99)

## 2020-01-18 LAB — BASIC METABOLIC PANEL
Anion gap: 10 (ref 5–15)
BUN: 12 mg/dL (ref 6–20)
CO2: 31 mmol/L (ref 22–32)
Calcium: 7.9 mg/dL — ABNORMAL LOW (ref 8.9–10.3)
Chloride: 97 mmol/L — ABNORMAL LOW (ref 98–111)
Creatinine, Ser: 0.32 mg/dL — ABNORMAL LOW (ref 0.44–1.00)
GFR calc Af Amer: >60 mL/min (ref >60)
GFR calc non Af Amer: >60 mL/min (ref >60)
Glucose, Bld: 142 mg/dL — ABNORMAL HIGH (ref 70–99)
Potassium: 3.6 mmol/L (ref 3.5–5.1)
Sodium: 138 mmol/L (ref 135–145)

## 2020-01-18 LAB — CBC WITH DIFFERENTIAL/PLATELET
Abs Immature Granulocytes: 0.03 10*3/uL (ref 0.00–0.07)
Basophils Absolute: 0 10*3/uL (ref 0.0–0.1)
Basophils Relative: 0 %
Eosinophils Absolute: 0.3 10*3/uL (ref 0.0–0.5)
Eosinophils Relative: 3 %
HCT: 24.1 % — ABNORMAL LOW (ref 36.0–46.0)
Hemoglobin: 7.1 g/dL — ABNORMAL LOW (ref 12.0–15.0)
Immature Granulocytes: 0 %
Lymphocytes Relative: 21 %
Lymphs Abs: 1.9 10*3/uL (ref 0.7–4.0)
MCH: 29.1 pg (ref 26.0–34.0)
MCHC: 29.5 g/dL — ABNORMAL LOW (ref 30.0–36.0)
MCV: 98.8 fL (ref 80.0–100.0)
Monocytes Absolute: 0.8 10*3/uL (ref 0.1–1.0)
Monocytes Relative: 9 %
Neutro Abs: 6 10*3/uL (ref 1.7–7.7)
Neutrophils Relative %: 67 %
Platelets: 287 10*3/uL (ref 150–400)
RBC: 2.44 MIL/uL — ABNORMAL LOW (ref 3.87–5.11)
RDW: 23.5 % — ABNORMAL HIGH (ref 11.5–15.5)
WBC: 9 10*3/uL (ref 4.0–10.5)
nRBC: 0 % (ref 0.0–0.2)

## 2020-01-18 MED ORDER — ZINC SULFATE 220 (50 ZN) MG PO CAPS
220.0000 mg | ORAL_CAPSULE | Freq: Every day | ORAL | Status: DC
  Administered 2020-01-18 – 2020-01-27 (×10): 220 mg
  Filled 2020-01-18 (×9): qty 1

## 2020-01-18 MED ORDER — POTASSIUM CHLORIDE 20 MEQ/15ML (10%) PO SOLN
20.0000 meq | Freq: Two times a day (BID) | ORAL | Status: DC
  Administered 2020-01-18 – 2020-01-27 (×20): 20 meq
  Filled 2020-01-18 (×20): qty 15

## 2020-01-18 MED ORDER — MELATONIN 3 MG PO TABS
3.0000 mg | ORAL_TABLET | Freq: Every day | ORAL | Status: DC
  Administered 2020-01-18 – 2020-02-02 (×14): 3 mg via ORAL
  Filled 2020-01-18 (×14): qty 1

## 2020-01-18 MED ORDER — ALTEPLASE 2 MG IJ SOLR
2.0000 mg | Freq: Once | INTRAMUSCULAR | Status: AC
  Administered 2020-01-18: 2 mg
  Filled 2020-01-18: qty 2

## 2020-01-18 NOTE — Progress Notes (Signed)
PROGRESS NOTE    Alyssa Carey  IRC:[id number] DOB: Jun 06, 1971 DOA: 12/21/2019 PCP: Patient, No Pcp Per   Chef Complaints:: Alcohol withdrawal   Brief Narrative: 49 year old female who presented secondary to concern for alcohol withdrawal and quickly developed evidence of necrotizing fasciitis, emergently managed in the OR with critical care, general surgery with I&D.  Patient was in ICU needing intubation and vasopressors for septic shock underwent repeat I&D and diverting colostomy.  She has been extubated and off vasopressors.  Hydrotherapy was started.  Remains in the stepdown unit and remains critically ill with multiple medical issues.  Significant events 6/06 admission 6/08 ICU admission to OR ofr I&D, back intubated and on pressors, 6/09 started tube feeds. Still pressor dependent,kept on vent w/ anticipated return to OR planned  6/10 Back to the OR for significant debridement and also placement of diverting colostomy 6/11 Sedated, low-dose phenylephrine. CCS felt wound looking good. Sedation changed to precedex / PRN fent  6/12 more awake, on 15/5 pressure support 6/13 family discussion >> DNR, continue medical care 6//15 transfuse 1 unit PRBC; vomiting, tube feeds held 6/16 Vomiting overnight 6/17 Trial PSV 15/5 with variable Vt, remains on vasopressors. Vomiting overnight  6/18 transfuse 1 unit PRBC 6/20 Off pressors, On precedex and weaning on PSV  6/21 Weaning on PSV, planned hydrotherapy 6/23 Extubated 6/24 Transferred to [facility] service.  Subjective:  No fever overnight.  Remains intermittently tachycardia 100-120s.  Blood pressure stable.On room air. Leukocytosis improved to 9.0 hemoglobin borderline 7.1 g. She appears alert awake oriented more communicative. Nursing reports she did not sleep much last night. good Urine output 3450, stool 300 Weight further down to 241 pound.  Assessment & Plan:  Necrotizing fasciitis of pelvic region and thigh: Patient had  emergent operative intervention with incision and debridement, diverting colostomy and had multiple rounds of IV antibiotics.  Foley in place to aid healing, wound is healing followed by surgery, wound care.  On hydrotherapy Monday Wednesday Friday.  Appreciate surgical input.  Continue to augment nutritional status to aid in wound healing.  Continue vitamin A vitamin C zinc  Septic shock secondary to necrotizing fasciitis needing ICU admissions intubation pressors.  It has resolved.  Wound grew E. coli, VRE.Patient received antibiotics including linezolid clindamycin Flagyl. Last dose 6/22.   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 well.  Leukocytosis: It has resolved.  Monitor.  Recent Labs  Lab 01/14/20 0420 01/15/20 0245 01/16/20 0552 01/17/20 0242 01/18/20 0500  WBC 13.7* 16.7* 12.5* 10.6* 9.0   Hypokalemia/hypomagnesemia: Labs have improved.  Repeat labs. Pt on scheduled 20 M EQ KCl twice daily while on diuresis. Recent Labs  Lab 01/13/20 0200 01/14/20 0420 01/15/20 0245 01/16/20 0552 01/17/20 0242  K 3.8 3.5 3.3* 3.3* 3.6   Acute hypoxic respiratory failure: Initially intubated and subsequently extubated. Currently on room air.  Severe protein calorie malnutrition with low albumin, anasarca with poor nutritional status due to severe illness.  Continue to augment nutritional status, thiamine folate zinc vitamin D/vitamin C/Vitamin A to aid in wound healing. Continue supplement with NG tube feeding.  Issue with NG tube dislodgment multiple times.  Continue to address her nutritional status with NG tube, speech and dietitian follow-up.    Fluid overload/anasarca:  Patient has significant gain in  the weight in the setting of volume resuscitation/antibiotics IV fluids, hypoalbuminemia.  On admission weight was 193 to 213 pounds, on 7/1 256.1 lb and started on lasix 20 mg iv bid-having good output, and wt is decreassing at 241 lb today. Cont on diuresis as tolerated with close monitoring of fluid lites renal function and blood pressure  Pressure injury/DTI of right ear not present on admission.  Continue local supportive care and dressing.  AKI with unknown baseline 1.7 upon admission, AKI has resolved.  Will monitor renal function closely while on diuresis. Recent Labs  Lab 01/13/20 0200 01/14/20 0420 01/15/20 0245 01/16/20 0552 01/17/20 0242  BUN 7 9 13 13 12   CREATININE 0.32* <0.30* <0.30* <0.30* <0.30*   Intermittent hypo-glycemia: Blood sugar has improved.  Monitor.  Recent Labs  Lab 01/17/20 1538 01/17/20 1942 01/17/20 2310 01/18/20 0318 01/18/20 0728  GLUCAP 108* 83 106* 135* 117*   Anemia of critical illness/acute blood loss anemia Baseline hemoglobin 12-13 developed anemia in the setting of infection on, critical illness, bleeding, receive 3 units of PRBC so far.  Patient has rectal bleeding GI consulted and performed bedside flexible sigmoidoscopy significant for 10 cm Stercoral ulcer 6/28 .  Will monitor CBC and transfuse for hemoglobin less than 7 g.  Continue mesalamine suppository for 10 days.    Morbid obesity with BMI 43.9.  Will benefit with weight loss and healthy lifestyle once acute issue resolves on outpatient basis  Tachycardia sinus in the setting of pain, anemia, wound multiple issues. She had CT of the chest that was negative for PE right lower leg is inflamed and duplex are negative for DVT.  Monitor closely.  Also noted to have swelling more on the left upper extremity than the right duplex was obtained 7/2 and negative for DVT-slowly improving with diuresis.    Erythema generalized: likely from use of Purple Wipe by patient's family and patient's  family has been educated on not to do anything without asking patient's nurses.  No significant itching.  Monitor closely.  Erythema improving.  DVT prophylaxis: Place and maintain sequential compression device Start: 01/10/20 1738.  Lovenox stopped due to bleeding from the wound Code Status: full Family Communication: plan of care discussed with patient and nursing staffs at bedside.  Status is: Inpatient  Remains inpatient appropriate because:Inpatient level of care appropriate due to severity of illness and For ongoing management wound, postop, multiple issues, poor oral intake Case manager working on [employer].  Dispo: The patient is from: Home              Anticipated d/c is to: LTAC vs SNF- but still needing NGT for nutrition- will be difficult SNF disposition.              Anticipated d/c date is: > 3 days              Patient currently is not medically stable to d/c.  Nutrition: Nutrition and speech are following. Diet Order            Diet full liquid Room service appropriate? Yes; Fluid consistency: Nectar Thick  Diet effective now                 Nutrition Problem: Increased nutrient needs Etiology: chronic illness, wound healing (alcohol abuse; necrotizing fasciitis) Signs/Symptoms: estimated needs Interventions: Tube feeding, Prostat Body mass index is 41.4 kg/m. Pressure Ulcer: Pressure Injury 12/27/19 Ear Right Deep Tissue Pressure Injury - Purple or maroon  localized area of discolored intact skin or blood-filled blister due to damage of underlying soft tissue from pressure and/or shear. (Active)  12/27/19 1200  Location: Ear  Location Orientation: Right  Staging: Deep Tissue Pressure Injury - Purple or maroon localized area of discolored intact skin or blood-filled blister due to damage of underlying soft tissue from pressure and/or shear.  Wound Description (Comments):   Present on Admission: No   Consultants:see note  Procedures:  ENDOTRACHEAL INTUBATION  (6/8 >> 01/07/2020)  INCISION AND DEBRIDEMENT OF LEFT BUTTOCK AND PERINEUM (12/23/2019)  IRRIGATION AND DEBRIDEMENT OF BUTTOCKS; LAPAROSCOPIC LOOP COLOSTOMY (12/25/2019)  FLEXIBLE SIGMOIDOSCOPY (01/11/2020)   Microbiology:see note  Medications: Scheduled Meds: . sodium chloride   Intravenous Once  . ascorbic acid  500 mg Per Tube BID  . chlorhexidine  15 mL Mouth Rinse BID  . Chlorhexidine Gluconate Cloth  6 each Topical Daily  . collagenase   Topical Daily  . ergocalciferol  50,000 Units Per Tube Q Fri  . feeding supplement (PIVOT 1.5 CAL)  1,000 mL Per Tube Q24H  . feeding supplement (PRO-STAT SUGAR FREE 64)  30 mL Per Tube Daily  . ferrous sulfate  300 mg Per Tube BID WC  . folic acid  1 mg Per Tube Daily  . furosemide  20 mg Intravenous BID  . insulin aspart  0-6 Units Subcutaneous Q4H  . insulin aspart  4 Units Subcutaneous Q4H  . mouth rinse  15 mL Mouth Rinse q12n4p  . mesalamine  500 mg Rectal QHS  . multivitamin with minerals  1 tablet Per Tube Daily  . pantoprazole sodium  40 mg Per Tube BID  . potassium chloride  20 mEq Oral BID  . sodium chloride flush  10-40 mL Intracatheter Q12H  . thiamine  100 mg Per Tube Daily  . vitamin A  20,000 Units Oral Daily  . zinc sulfate  220 mg Oral Daily   Continuous Infusions: . sodium chloride      Antimicrobials:  Ceftriaxone  Vancomycin  Clindamycin  Linezolid  Flagyl  Finished all antibiotic therapy. Anti-infectives (From admission, onward)   Start     Dose/Rate Route Frequency Ordered Stop   12/31/19 1600  metroNIDAZOLE (FLAGYL) IVPB 500 mg  Status:  Discontinued        500 mg 100 mL/hr over 60 Minutes Intravenous Every 8 hours 12/31/19 1505 01/06/20 0859   12/28/19 1400  vancomycin (VANCOCIN) IVPB 1000 mg/200 mL premix  Status:  Discontinued        1,000 mg 200 mL/hr over 60 Minutes Intravenous Every 24 hours 12/28/19 1005 12/28/19 1343   12/28/19 1400  linezolid (ZYVOX) IVPB 600 mg        600 mg 300 mL/hr  over 60 Minutes Intravenous Every 12 hours 12/28/19 1343 01/06/20 1954   12/26/19 1948  vancomycin variable dose per unstable renal function (pharmacist dosing)  Status:  Discontinued         Does not apply See admin instructions 12/26/19 1948 12/28/19 1005   12/23/19 2000  vancomycin (VANCOCIN) IVPB 1000 mg/200 mL premix  Status:  Discontinued        1,000 mg 200 mL/hr over 60 Minutes Intravenous Every 8 hours 12/23/19 1027 12/26/19 1944   12/23/19 1300  clindamycin (CLEOCIN) IVPB 900 mg  Status:  Discontinued        900 mg 100 mL/hr over 30 Minutes Intravenous Every 8 hours 12/23/19 1205 12/31/19 1505   12/23/19 1200  metroNIDAZOLE (FLAGYL) IVPB 500 mg  Status:  Discontinued        500 mg 100 mL/hr over 60 Minutes Intravenous Every 8 hours 12/23/19 1135 12/23/19 1254   12/23/19 1045  vancomycin (VANCOREADY) IVPB 2000 mg/400 mL        2,000 mg 200 mL/hr over 120 Minutes Intravenous NOW 12/23/19 1018 12/23/19 1441   12/23/19 0300  cefTRIAXone (ROCEPHIN) 2 g in sodium chloride 0.9 % 100 mL IVPB  Status:  Discontinued        2 g 200 mL/hr over 30 Minutes Intravenous Every 24 hours 12/23/19 0236 01/06/20 0859       Objective: Vitals: Today's Vitals   01/18/20 0400 01/18/20 0439 01/18/20 0500 01/18/20 0600  BP: (!) 137/56  113/83 (!) 132/94  Pulse: (!) 101  (!) 119 (!) 114  Resp: (!) 22  (!) 26 (!) 23  Temp: 98.6 F (37 C)     TempSrc: Oral     SpO2: 100%  100% 100%  Weight:  109.4 kg    Height:      PainSc: 0-No pain       Intake/Output Summary (Last 24 hours) at 01/18/2020 0746 Last data filed at 01/18/2020 0600 Gross per 24 hour  Intake 1837.33 ml  Output 3750 ml  Net -1912.67 ml   Filed Weights   01/16/20 0500 01/17/20 0500 01/18/20 0439  Weight: 116.1 kg 112.7 kg 109.4 kg   Weight change: -3.3 kg   Intake/Output from previous day: 07/03 0701 - 07/04 0700 In: 1837.3 [P.O.:540; I.V.:30; NG/GT:1267.3] Out: 3750 [Urine:3450; Stool:300] Intake/Output this shift: No  intake/output data recorded.  Examination:  General exam: AAO, older for age, not in distress, weak appearing. HEENT:Oral mucosa moist, Ear/Nose WNL grossly, dentition normal. Respiratory system: bilaterally diminished,no wheezing or crackles,no use of accessory muscle Cardiovascular system: S1 & S2 +, No JVD,. Gastrointestinal system: Abdomen soft, NT,ND, BS+.  Left colostomy distally intact, Foley in place.  Buttock wound as seen in the picture Nervous System:Alert, awake, moving extremities and grossly nonfocal Extremities: No edema, distal peripheral pulses palpable.  Skin: No rashes,no icterus. MSK: Normal muscle bulk,tone, power     Data Reviewed: I have personally reviewed following labs and imaging studies CBC: Recent Labs  Lab 01/14/20 0420 01/15/20 0245 01/16/20 0552 01/17/20 0242 01/18/20 0500  WBC 13.7* 16.7* 12.5* 10.6* 9.0  NEUTROABS 10.8* 13.4* 9.0* 7.1 6.0  HGB 8.6* 8.6* 7.9* 7.7* 7.1*  HCT 27.9* 27.9* 26.0* 25.5* 24.1*  MCV 95.5 95.2 97.4 97.3 98.8  PLT 398 380 330 341 381   Basic Metabolic Panel: Recent Labs  Lab 01/12/20 1719 01/12/20 1719 01/13/20 0200 01/13/20 1840 01/14/20 0420 01/15/20 0245 01/16/20 0552 01/17/20 0242  NA 142   < > 141  --  139 142 140 138  K 3.2*   < > 3.8  --  3.5 3.3* 3.3* 3.6  CL 112*   < > 111  --  109 109 107 99  CO2 24   < > 23  --  26 25 28 29   GLUCOSE 113*   < > 132*  --  139* 138* 93 110*  BUN 5*   < > 7  --  9 13 13 12   CREATININE 0.33*   < > 0.32*  --  <0.30* <0.30* <0.30* <0.30*  CALCIUM 7.6*   < > 7.6*  --  7.6* 7.4* 7.8* 7.9*  MG 1.4*  --  1.8 1.7  --  1.5* 1.8  --   PHOS 3.9  --  3.6 3.0  --  2.1* 2.5  --    < > = values in this interval not displayed.   GFR: CrCl cannot be calculated (This lab value cannot be used to calculate CrCl because it is not a number: <0.30). Liver Function Tests: Recent Labs  Lab 01/13/20 0200 01/15/20 0245 01/16/20 0552 01/17/20 0242  AST 29 20 22 27   ALT 15 11 13 14     ALKPHOS 127* 93 92 96  BILITOT 0.6 0.7 0.3 0.5  PROT 4.9* 4.3* 4.8* 5.2*  ALBUMIN 1.2* 1.1* 1.2* 1.3*   No results for input(s): LIPASE, AMYLASE in the last 168 hours. No results for input(s): AMMONIA in the last 168 hours. Coagulation Profile: No results for input(s): INR, PROTIME in the last 168 hours. Cardiac Enzymes: No results for input(s): CKTOTAL, CKMB, CKMBINDEX, TROPONINI in the last 168 hours. BNP (last 3 results) No results for input(s): PROBNP in the last 8760 hours. HbA1C: No results for input(s): HGBA1C in the last 72 hours. CBG: Recent Labs  Lab 01/17/20 1538 01/17/20 1942 01/17/20 2310 01/18/20 0318 01/18/20 0728  GLUCAP 108* 83 106* 135* 117*   Lipid Profile: No results for input(s): CHOL, HDL, LDLCALC, TRIG, CHOLHDL, LDLDIRECT in the last 72 hours. Thyroid Function Tests: No results for input(s): TSH, T4TOTAL, FREET4, T3FREE, THYROIDAB in the last 72 hours. Anemia Panel: No results for input(s): VITAMINB12, FOLATE, FERRITIN, TIBC, IRON, RETICCTPCT in the last 72 hours. Sepsis Labs: No results for input(s): PROCALCITON, LATICACIDVEN in the last 168 hours.  No results found for this or any previous visit (from the past 240 hour(s)).    Radiology Studies: DG Abd 1 View  Result Date: 01/17/2020 CLINICAL DATA:  New feeding tube placement. EXAM: ABDOMEN - 1 VIEW COMPARISON:  01/16/2020 FINDINGS: Feeding tube is in place, tip within the stomach, redirected upon itself in the region of the fundus. Bowel gas pattern is nonobstructive. IMPRESSION: Feeding tube tip in the stomach. Electronically Signed   By: Nolon Nations M.D.   On: 01/17/2020 11:21   DG Abd 1 View  Result Date: 01/16/2020 CLINICAL DATA:  Feeding tube replacement. EXAM: ABDOMEN - 1 VIEW COMPARISON:  Same day. FINDINGS: The bowel gas pattern is normal. Feeding tube is noted which is looped within the proximal stomach, with the distal tip in the most proximal stomach. No radio-opaque calculi or  other significant radiographic abnormality are seen. IMPRESSION: Feeding tube is looped within proximal stomach, with distal tip in the most proximal stomach. Electronically Signed   By: Marijo Conception M.D.   On: 01/16/2020 10:48   DG Abd 1 View  Result Date: 01/16/2020 CLINICAL DATA:  Nasogastric tube placement. EXAM: ABDOMEN - 1 VIEW COMPARISON:  January 16, 2020. FINDINGS: The bowel gas pattern is normal. Distal tip of feeding tube is seen in distal stomach which is unchanged compared to prior exam. No radio-opaque calculi or other significant radiographic abnormality are seen. IMPRESSION: Distal tip of feeding tube seen in distal stomach. Electronically Signed   By: Marijo Conception M.D.   On: 01/16/2020 09:09   DG Abd 1 View  Result Date: 01/16/2020 CLINICAL DATA:  Check gastric catheter placement EXAM: ABDOMEN - 1 VIEW COMPARISON:  Film from earlier in the same day. FINDINGS: Weighted feeding catheter is noted extending into at least the distal stomach and possibly into the proximal duodenum. It is uncertain whether the catheter is kinked upon itself within the distal stomach or proceeds into the proximal duodenum. IMPRESSION: Weighted  feeding catheter as described. There is kinking of the catheter distally which may lie within the distal stomach or proximal duodenum. Contrast injection may be helpful for further evaluation. Electronically Signed   By: Inez Catalina M.D.   On: 01/16/2020 08:57   VAS [place] UPPER EXTREMITY VENOUS DUPLEX  Result Date: 01/16/2020 UPPER VENOUS STUDY  Indications: Swelling Risk Factors: None identified. Limitations: Body habitus, poor ultrasound/tissue interface, bandages and line. Comparison Study: No prior studies. Performing Technologist: Oliver Hum RVT  Examination Guidelines: A complete evaluation includes B-mode imaging, spectral Doppler, color Doppler, and power Doppler as needed of all accessible portions of each vessel. Bilateral testing is considered an integral part  of a complete examination. Limited examinations for reoccurring indications may be performed as noted.  Right Findings: +----------+------------+---------+-----------+----------+-------+ RIGHT     CompressiblePhasicitySpontaneousPropertiesSummary +----------+------------+---------+-----------+----------+-------+ Subclavian    Full       Yes       Yes                      +----------+------------+---------+-----------+----------+-------+  Left Findings: +----------+------------+---------+-----------+----------+-------+ LEFT      CompressiblePhasicitySpontaneousPropertiesSummary +----------+------------+---------+-----------+----------+-------+ IJV           Full       Yes       Yes                      +----------+------------+---------+-----------+----------+-------+ Subclavian    Full       Yes       Yes                      +----------+------------+---------+-----------+----------+-------+ Axillary      Full       Yes       Yes                      +----------+------------+---------+-----------+----------+-------+ Brachial      Full       Yes       Yes                      +----------+------------+---------+-----------+----------+-------+ Radial        Full                                          +----------+------------+---------+-----------+----------+-------+ Ulnar         Full                                          +----------+------------+---------+-----------+----------+-------+ Cephalic      Full                                          +----------+------------+---------+-----------+----------+-------+ Basilic       Full                                          +----------+------------+---------+-----------+----------+-------+  Summary:  Right: No evidence of thrombosis in the subclavian.  Left: No evidence of deep vein thrombosis in the upper extremity. No evidence of superficial vein  thrombosis in the upper extremity.  *See  table(s) above for measurements and observations.  Diagnosing physician: Shreshta Martinez MD Electronically signed by Shaterria Martinez MD on 01/16/2020 at 6:33:16 PM.    Final      LOS: 64 days   Antonieta Pert, MD [facility]  01/18/2020, 7:46 AM

## 2020-01-19 LAB — CBC
HCT: 28.6 % — ABNORMAL LOW (ref 36.0–46.0)
Hemoglobin: 8.7 g/dL — ABNORMAL LOW (ref 12.0–15.0)
MCH: 29.2 pg (ref 26.0–34.0)
MCHC: 30.4 g/dL (ref 30.0–36.0)
MCV: 96 fL (ref 80.0–100.0)
Platelets: 308 10*3/uL (ref 150–400)
RBC: 2.98 MIL/uL — ABNORMAL LOW (ref 3.87–5.11)
RDW: 22.5 % — ABNORMAL HIGH (ref 11.5–15.5)
WBC: 12.3 10*3/uL — ABNORMAL HIGH (ref 4.0–10.5)
nRBC: 0 % (ref 0.0–0.2)

## 2020-01-19 LAB — GLUCOSE, CAPILLARY
Glucose-Capillary: 124 mg/dL — ABNORMAL HIGH (ref 70–99)
Glucose-Capillary: 133 mg/dL — ABNORMAL HIGH (ref 70–99)
Glucose-Capillary: 139 mg/dL — ABNORMAL HIGH (ref 70–99)
Glucose-Capillary: 150 mg/dL — ABNORMAL HIGH (ref 70–99)
Glucose-Capillary: 91 mg/dL (ref 70–99)

## 2020-01-19 LAB — BASIC METABOLIC PANEL
Anion gap: 9 (ref 5–15)
BUN: 15 mg/dL (ref 6–20)
CO2: 31 mmol/L (ref 22–32)
Calcium: 8.1 mg/dL — ABNORMAL LOW (ref 8.9–10.3)
Chloride: 94 mmol/L — ABNORMAL LOW (ref 98–111)
Creatinine, Ser: 0.3 mg/dL — ABNORMAL LOW (ref 0.44–1.00)
GFR calc Af Amer: >60 mL/min (ref >60)
GFR calc non Af Amer: >60 mL/min (ref >60)
Glucose, Bld: 150 mg/dL — ABNORMAL HIGH (ref 70–99)
Potassium: 4 mmol/L (ref 3.5–5.1)
Sodium: 134 mmol/L — ABNORMAL LOW (ref 135–145)

## 2020-01-19 MED ORDER — TRAZODONE HCL 50 MG PO TABS
50.0000 mg | ORAL_TABLET | Freq: Every day | ORAL | Status: DC
  Administered 2020-01-19 – 2020-01-27 (×9): 50 mg via ORAL
  Filled 2020-01-19 (×9): qty 1

## 2020-01-19 MED ORDER — METOPROLOL TARTRATE 12.5 MG HALF TABLET: 12.5000 mg | ORAL_TABLET | Freq: Two times a day (BID) | ORAL | Status: DC

## 2020-01-19 MED ORDER — METOPROLOL TARTRATE 5 MG/5ML IV SOLN
2.5000 mg | INTRAVENOUS | Status: AC | PRN
  Administered 2020-01-19: 2.5 mg via INTRAVENOUS
  Filled 2020-01-19: qty 5

## 2020-01-19 MED ORDER — ACETAMINOPHEN 160 MG/5ML PO SOLN
650.0000 mg | Freq: Four times a day (QID) | ORAL | Status: DC | PRN
  Administered 2020-01-19 – 2020-01-27 (×14): 650 mg
  Filled 2020-01-19 (×14): qty 20.3

## 2020-01-19 MED ORDER — METOPROLOL TARTRATE 25 MG/10 ML ORAL SUSPENSION
12.5000 mg | Freq: Two times a day (BID) | ORAL | Status: DC
  Administered 2020-01-19 – 2020-01-27 (×17): 12.5 mg
  Filled 2020-01-19 (×19): qty 5

## 2020-01-19 MED ORDER — FUROSEMIDE 40 MG PO TABS
40.0000 mg | ORAL_TABLET | Freq: Every day | ORAL | Status: DC
  Administered 2020-01-20 – 2020-01-26 (×7): 40 mg via ORAL
  Filled 2020-01-19 (×7): qty 1

## 2020-01-19 NOTE — Progress Notes (Signed)
  Speech Language Pathology Treatment: Dysphagia  Patient Details Name: Alyssa Carey MRN: [id number] DOB: August 21, 1970 Today's Date: 01/19/2020 Time: 2446-2863 SLP Time Calculation (min) (ACUTE ONLY): 22 min  Assessment / Plan / Recommendation Clinical Impression  Pt seen for skilled dysphagia treatment with min verbal cues given for intake of ice chips (one by one) and nectar-thickened liquids via tsp.  Pt's vocal quality improved this session as she was able to vocalize with low intensity (slightly above a whisper) when prior swallow tx indicated aphonic vocal quality.  Pt exhibited delayed throat clear with ice chips after entire intake, but not with nectar-thickened liquids by spoon.  Effortful swallow and repetitive swallows after throat clearing completed during PO intake.  Pt demonstrating less overt s/s of aspiration clinically during trial today, but repeat MBS indicated prior to upgrade to thin liquids or any masticated boluses d/t silent aspiration risk.  Pt consumed approximately 3 oz of nectar-thickened liquids via spoon before refusing further trials.  ST will continue efforts as able to progress diet as pt able and continue dysphagia tx/management.    HPI HPI: 49 year old with alcohol abuse, alcohol withdrawal, necrotizing fasciitis of left buttock status post debridement. Acute respiratory failure secondary to septic shock. Intubated from 6/8 to 6/23.   Pt underwent MBS on Saturday 01/09/2020 with findings of infrequent and trace, silent aspiration of nectar and thin.  Recommended to start diet of dys1/small, controlled sips of nectar.  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  ENT advised pt high aspiration risk.   Follow up indicated.      SLP Plan  Continue with current plan of care       Recommendations  Diet recommendations: Nectar-thick liquid Liquids provided via: Cup;Straw Medication Administration: Crushed with puree Supervision: Full supervision/cueing for compensatory strategies;Staff to assist with self feeding Compensations: Slow rate;Small sips/bites;Effortful swallow;Clear throat intermittently Postural Changes and/or Swallow Maneuvers: Seated upright 90 degrees                Oral Care Recommendations: Oral care QID Follow up Recommendations: Skilled Nursing facility SLP Visit Diagnosis: Dysphagia, oropharyngeal phase (R13.12) Plan: Continue with current plan of care                       Elvina Sidle, M.S., CCC-SLP 01/19/2020, 5:29 PM

## 2020-01-19 NOTE — Consult Note (Addendum)
[place] Nurse ostomy follow up Stoma type/location: colostomy Stomal assessment/size: below skin level, pink, moist, 1 1/2 inches Peristomal assessment: intact skin surrounding Output: mod amt tan liquid stool Ostomy pouching: 1pc. flexible with barrier ring Education provided: Patient not cognitively able to participate. Does not watch the process or verbalize questions Enrolled patient in [facility] Discharge program: Not yet Applied barrier ring and one piece flexible pouch.  Extra supplies left in the room for staff nurse use.  [place] team will begin teaching sessions when pt is stable and out of ICU  [place] Nurse wound follow up Previously noted Unstageable pressure injury to right outer ear is slowly resolving.  Marland Kitchen2X/2X.1cm, red and dry stage 3 healing pressure injury.  Leave open to air and keep positioned off the affected area.  [place] Nurse team will follow with you and see patient within 10 days for wound assessments.  Please notify [place] nurses of any acute changes in the wounds or any new areas of concern. Julien Girt MSN, RN, [place], [place], [place]

## 2020-01-19 NOTE — Progress Notes (Signed)
Physical Therapy Treatment Patient Details Name: Alyssa Carey MRN: [id number] DOB: 03/22/1971 Today's Date: 01/19/2020    History of Present Illness 49 y/o female with a heavy alcohol history admitted6/8/21  in the setting of confusion and presumed alcohol withdrawal who later developed sepsis from a gas forming wound, S/P debridement left gluteal wound for necratizing fascitis.VDRF,. Extubated 01/07/20., negative for DVT in bilateral legs and PE.    PT Comments    The patient is awake and alert, able to hear her speech more clearly.  Patient is improving in mobility of upper body and trunk, legs remain weak but is noted to have improved strength. Patient does endorse right ankle  Discomfort when pushing into dorsiflexion. Placed in bed chair position to work on sitting upright. Will consider sitting on bed edge and tilt bed for standing next visit.  Follow Up Recommendations  SNF     Equipment Recommendations   (tba)    Recommendations for Other Services       Precautions / Restrictions Precautions Precautions: Fall Precaution Comments: left gluteal wound, L colostomy, R abd drain from [place], NG tube    Mobility  Bed Mobility               General bed mobility comments: placed bed in chair position, patient  able to lean forward with min assist and maintain while holding onto rails. left in chair position.  Transfers                    Ambulation/Gait                 Stairs             Wheelchair Mobility    Modified Rankin (Stroke Patients Only)       Balance       Sitting balance - Comments: sits with chair position of bed with no back support, weight shifts , leans forward.                                    Cognition Arousal/Alertness: Awake/alert Behavior During Therapy: Flat affect Overall Cognitive Status: No family/caregiver present to determine baseline cognitive  functioning Area of Impairment: Awareness                   Current Attention Level: Selective   Following Commands: Follows one step commands consistently   Awareness: Emergent   General Comments: patient A/O x 3, following 1-2 step, Asking for RN to come to room. Explained PT can perform tx without RN.      Exercises      General Comments        Pertinent Vitals/Pain Faces Pain Scale: Hurts even more Pain Location: right foot when dorsiflexing Pain Descriptors / Indicators: Discomfort Pain Intervention(s): Premedicated before session    Home Living                      Prior Function            PT Goals (current goals can now be found in the care plan section) Progress towards PT goals: Progressing toward goals    Frequency    Min 2X/week      PT Plan Current plan remains appropriate    Co-evaluation              AM-PAC PT "  6 Clicks" Mobility   Outcome Measure  Help needed turning from your back to your side while in a flat bed without using bedrails?: A Lot Help needed moving from lying on your back to sitting on the side of a flat bed without using bedrails?: A Lot Help needed moving to and from a bed to a chair (including a wheelchair)?: Total Help needed standing up from a chair using your arms (e.g., wheelchair or bedside chair)?: Total Help needed to walk in hospital room?: Total Help needed climbing 3-5 steps with a railing? : Total 6 Click Score: 8    End of Session   Activity Tolerance: Patient tolerated treatment well Patient left: in bed;with call bell/phone within reach Nurse Communication: Mobility status;Need for lift equipment PT Visit Diagnosis: Muscle weakness (generalized) (M62.81)     Time: 8657-8469 PT Time Calculation (min) (ACUTE ONLY): 23 min  Charges:  $Therapeutic Activity: 23-37 mins                     Tresa Endo PT Acute Rehabilitation Services Pager [phone] Office  [phone]    Claretha Cooper 01/19/2020, 2:26 PM

## 2020-01-19 NOTE — Progress Notes (Signed)
   01/19/20 1400  Subjective Assessment  Subjective patient awake, mouths words, shakes head  to answer  Patient and Family Stated Goals agreed to wound care today, nods yes   Prior Treatments s/p surgical I & D x2, diverting colostomy  Evaluation and Treatment  Evaluation and Treatment Procedures Explained to Patient/Family Yes  Evaluation and Treatment Procedures agreed to  Wound / Incision (Open or Dehisced) 12/27/19 Non-pressure wound Buttocks Left *PT ONLY* open wound L gluteal area  Date First Assessed/Time First Assessed: 12/27/19 1410   Wound Type: Non-pressure wound  Location: Buttocks  Location Orientation: Left  Wound Description (Comments): *PT ONLY* open wound L gluteal area  Present on Admission: (c)   Dressing Type ABD;Gauze (Comment) (santyl)  Dressing Changed New  Dressing Status Clean  Dressing Change Frequency PRN  Site / Wound Assessment Granulation tissue;Yellow  % Wound base Red or Granulating 80%  % Wound base Yellow/Fibrinous Exudate 20%  Peri-wound Assessment Intact;Maceration (maceration near periarea)  Wound Length (cm) 17 cm  Wound Width (cm) 13 cm  Wound Depth (cm) 8 cm  Wound Volume (cm^3) 1768 cm^3  Wound Surface Area (cm^2) 221 cm^2  Tunneling (cm) 6 (@500 )  Undermining (cm) 4  Margins Unattached edges (unapproximated)  Drainage Amount Moderate  Drainage Description Serosanguineous  Non-staged Wound Description Full thickness  Treatment Debridement (Selective);Hydrotherapy (Pulse lavage);Packing (Saline gauze) (santyl)  Hydrotherapy  Pulsed lavage therapy - wound location L gluteal area   Pulsed Lavage with Suction (psi) 12 psi  Pulsed Lavage with Suction - Normal Saline Used 1000 mL  Pulsed Lavage Tip Tip with splash shield  Selective Debridement  Selective Debridement - Location left glute  Selective Debridement - Tools Used Forceps;Scalpel  Selective Debridement - Tissue Removed necrotic fat, slough   Wound Therapy -  Assess/Plan/Recommendations  Wound Therapy - Clinical Statement patient is awake, asking for Adele Barthel, RN. Patient assisted with rolling to the left for procedure. Patient's wound is red with some bleeding. Greenish/brown mucous from rectum when rolled to side.  Will have surgery assess need to continue hydro on 7/7.   Wound Therapy - Functional Problem List immobility, very lalt affect with very little initiation  Factors Delaying/Impairing Wound Healing Substance abuse;Immobility;Multiple medical problems  Hydrotherapy Plan Debridement;Dressing change;Pulsatile lavage with suction  Wound Therapy - Frequency 3X / week  Wound Therapy - Current Recommendations PT  Wound Therapy - Follow Up Recommendations Skilled nursing facility  Wound Plan Pt will benefit from hydrotherapy as part a multi-modal/multi-disciplinary wound care to approach to facilitate healing and decr bioburden.   Wound Therapy Goals - Improve the function of patient's integumentary system by progressing the wound(s) through the phases of wound healing by:  Decrease Necrotic Tissue to 100  Decrease Necrotic Tissue - Progress Progressing toward goal  Increase Granulation Tissue - Progress Progressing toward goal  Improve Drainage Characteristics Min  Improve Drainage Characteristics - Progress Progressing toward goal  Goals/treatment plan/discharge plan were made with and agreed upon by patient/family Yes  Time For Goal Achievement 2 weeks (7/30)  Wound Therapy - Potential for Goals Good  Tresa Endo PT [place] Pager [phone] Office [phone]

## 2020-01-19 NOTE — Progress Notes (Signed)
PROGRESS NOTE    ALANEY WITTER  PXT:[id number] DOB: 03/05/1971 DOA: 12/21/2019 PCP: Patient, No Pcp Per   Chef Complaints:: Alcohol withdrawal   Brief Narrative: 49 year old female who presented secondary to concern for alcohol withdrawal and quickly developed evidence of necrotizing fasciitis, emergently managed in the OR with critical care, general surgery with I&D.  Patient was in ICU needing intubation and vasopressors for septic shock underwent repeat I&D and diverting colostomy.  She has been extubated and off vasopressors.  Hydrotherapy was started.  Remains in the stepdown unit and remains critically ill with multiple medical issues.  Significant events 6/06 admission 6/08 ICU admission to OR ofr I&D, back intubated and on pressors, 6/09 started tube feeds. Still pressor dependent,kept on vent w/ anticipated return to OR planned  6/10 Back to the OR for significant debridement and also placement of diverting colostomy 6/11 Sedated, low-dose phenylephrine. CCS felt wound looking good. Sedation changed to precedex / PRN fent  6/12 more awake, on 15/5 pressure support 6/13 family discussion >> DNR, continue medical care 6//15 transfuse 1 unit PRBC; vomiting, tube feeds held 6/16 Vomiting overnight 6/17 Trial PSV 15/5 with variable Vt, remains on vasopressors. Vomiting overnight  6/18 transfuse 1 unit PRBC 6/20 Off pressors, On precedex and weaning on PSV  6/21 Weaning on PSV, planned hydrotherapy 6/23 Extubated 6/24 Transferred to [facility] service.  Subjective: Did not sleep well last night again overall feels well, on RA, NGT+, LLQ Colostomy+ HR tachy as before in 100-120 good Urine output 3400 Weight further down to 235 lb pound.  Assessment & Plan:  Necrotizing fasciitis of pelvic region and thigh: Patient had emergent operative intervention with incision and debridement, diverting colostomy and had multiple rounds of IV antibiotics.  Foley in place to aid healing, wound  is healing followed by surgery, wound care.  Continue plan for surgery on the hydrotherapy Monday Wednesday Friday, surgical site clean dry and slowly improving.  Continue to address nutritional status with NG tube while p.o. intake is not adequate.Continue vitamin A vitamin C zinc  Septic shock secondary to necrotizing fasciitis needing ICU admissions intubation pressors.  It has resolved.  Wound grew E. coli, VRE.Patient received antibiotics including linezolid clindamycin Flagyl. Last dose 6/22.   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 well.  Leukocytosis: Remains at 9-12k.  Afebrile. Recent Labs  Lab 01/15/20 0245 01/16/20 0552 01/17/20 0242 01/18/20 0500 01/19/20 0340  WBC 16.7* 12.5* 10.6* 9.0 12.3*   Hypokalemia/hypomagnesemia: Potassium and magnesium remains stable.Pt on scheduled 20 M EQ KCl twice daily while on diuresis. Recent Labs  Lab 01/15/20 0245 01/16/20 0552 01/17/20 0242 01/18/20 0801 01/19/20 0340  K 3.3* 3.3* 3.6 3.6 4.0   Acute hypoxic respiratory failure: Initially intubated and subsequently extubated. Currently on room air.  Severe protein calorie malnutrition with low albumin, anasarca with poor nutritional status due to severe illness.  Continue to augment nutritional status, thiamine folate zinc vitamin D/vitamin C/Vitamin A to aid in wound healing. Continue supplement with NG tube feeding.  Issue with NG tube dislodgment multiple times.  Continue to address her nutritional status with NG tube, speech and dietitian follow-up.    Fluid overload/anasarca: Suspecting from fluid resuscitation antibiotics and hypoalbuminemia On admission weight was 193  to 213 pounds, on 7/1  256.1 lb and started on lasix 20 mg iv bid w kcl and having good urine output, weight down to 235 pound.  Continue to monitor renal function closely.  Will change to p.o. Lasix in am.  Pressure injury/DTI of right ear not present on admission.  Continue local supportive care and dressing.  AKI with unknown baseline 1.7 upon admission, AKI has resolved.  BUN/creatinine stable.  Monitor closely.  Recent Labs  Lab 01/15/20 0245 01/16/20 0552 01/17/20 0242 01/18/20 0801 01/19/20 0340  BUN 13 13 12 12 15   CREATININE <0.30* <0.30* <0.30* 0.32* 0.30*   Intermittent hypo-glycemia: Blood sugar has improved.  Monitor.  Recent Labs  Lab 01/18/20 1510 01/18/20 2024 01/18/20 2348 01/19/20 0320 01/19/20 0729  GLUCAP 121* 163* 131* 150* 133*   Anemia of critical illness/acute blood loss anemia Baseline hemoglobin 12-13 developed anemia in the setting of infection on, critical illness, bleeding, receive 3 units of PRBC so far.  Patient has rectal bleeding GI consulted and performed bedside flexible sigmoidoscopy significant for 10 cm Stercoral ulcer 6/28 .  Will monitor CBC and transfuse for hemoglobin less than 7 g.  Continue mesalamine suppository for 10 days.    Morbid obesity with BMI 43.9.  Will benefit with weight loss and healthy lifestyle once acute issue resolves on outpatient basis  Tachycardia sinus in the setting of pain, anemia, wound multiple issues. She had CT of the chest that was negative for PE right lower leg is inflamed and duplex are negative for DVT.  Monitor closely.  Also noted to have swelling more on the left upper extremity than the right duplex was obtained 7/2 and negative for DVT-slowly improving with diuresis.  If persistent can do trial of low-dose metoprolol as long as blood pressure tolerates  Erythema generalized: likely from use of Purple Wipe by patient's family and patient's family has been educated on not to do anything without asking patient's nurses.  No  significant itching.  Monitor closely.  Erythema improving.  DVT prophylaxis: Place and maintain sequential compression device Start: 01/10/20 1738.  Lovenox stopped due to bleeding from the wound Code Status: full Family Communication: plan of care discussed with patient and nursing staffs at bedside.  Status is: Inpatient  Remains inpatient appropriate because:Inpatient level of care appropriate due to severity of illness and For ongoing management wound, postop, multiple issues, poor oral intake Case manager working on [employer].  Dispo: The patient is from: Home              Anticipated d/c is to: LTAC vs SNF- but still needing NGT for nutrition- will be difficult SNF disposition.              Anticipated d/c date is: > 3 days              Patient currently is not medically stable to d/c.  Nutrition: Nutrition and speech are following. Diet Order            Diet full liquid Room service appropriate? Yes; Fluid consistency: Nectar Thick  Diet effective now                 Nutrition Problem: Increased nutrient needs Etiology: chronic illness, wound healing (alcohol abuse; necrotizing fasciitis) Signs/Symptoms: estimated needs Interventions: Tube feeding, Prostat Body mass index is 40.38 kg/m. Pressure Ulcer: Pressure Injury 12/27/19 Ear Right Deep Tissue Pressure Injury - Purple or maroon localized area of discolored intact skin or blood-filled blister due  to damage of underlying soft tissue from pressure and/or shear. (Active)  12/27/19 1200  Location: Ear  Location Orientation: Right  Staging: Deep Tissue Pressure Injury - Purple or maroon localized area of discolored intact skin or blood-filled blister due to damage of underlying soft tissue from pressure and/or shear.  Wound Description (Comments):   Present on Admission: No   Consultants:see note  Procedures:  ENDOTRACHEAL INTUBATION (6/8 >> 01/07/2020)  INCISION AND DEBRIDEMENT OF LEFT BUTTOCK AND PERINEUM  (12/23/2019)  IRRIGATION AND DEBRIDEMENT OF BUTTOCKS; LAPAROSCOPIC LOOP COLOSTOMY (12/25/2019)  FLEXIBLE SIGMOIDOSCOPY (01/11/2020)   Microbiology:see note  Medications: Scheduled Meds: . sodium chloride   Intravenous Once  . ascorbic acid  500 mg Per Tube BID  . chlorhexidine  15 mL Mouth Rinse BID  . Chlorhexidine Gluconate Cloth  6 each Topical Daily  . collagenase   Topical Daily  . ergocalciferol  50,000 Units Per Tube Q Fri  . feeding supplement (PIVOT 1.5 CAL)  1,000 mL Per Tube Q24H  . feeding supplement (PRO-STAT SUGAR FREE 64)  30 mL Per Tube Daily  . ferrous sulfate  300 mg Per Tube BID WC  . folic acid  1 mg Per Tube Daily  . furosemide  20 mg Intravenous BID  . insulin aspart  0-6 Units Subcutaneous Q4H  . insulin aspart  4 Units Subcutaneous Q4H  . mouth rinse  15 mL Mouth Rinse q12n4p  . melatonin  3 mg Oral QHS  . mesalamine  500 mg Rectal QHS  . multivitamin with minerals  1 tablet Per Tube Daily  . pantoprazole sodium  40 mg Per Tube BID  . potassium chloride  20 mEq Per Tube BID  . sodium chloride flush  10-40 mL Intracatheter Q12H  . thiamine  100 mg Per Tube Daily  . vitamin A  20,000 Units Oral Daily  . zinc sulfate  220 mg Per Tube Daily   Continuous Infusions: . sodium chloride      Antimicrobials:  Ceftriaxone  Vancomycin  Clindamycin  Linezolid  Flagyl  Finished all antibiotic therapy. Anti-infectives (From admission, onward)   Start     Dose/Rate Route Frequency Ordered Stop   12/31/19 1600  metroNIDAZOLE (FLAGYL) IVPB 500 mg  Status:  Discontinued        500 mg 100 mL/hr over 60 Minutes Intravenous Every 8 hours 12/31/19 1505 01/06/20 0859   12/28/19 1400  vancomycin (VANCOCIN) IVPB 1000 mg/200 mL premix  Status:  Discontinued        1,000 mg 200 mL/hr over 60 Minutes Intravenous Every 24 hours 12/28/19 1005 12/28/19 1343   12/28/19 1400  linezolid (ZYVOX) IVPB 600 mg        600 mg 300 mL/hr over 60 Minutes Intravenous Every 12  hours 12/28/19 1343 01/06/20 1954   12/26/19 1948  vancomycin variable dose per unstable renal function (pharmacist dosing)  Status:  Discontinued         Does not apply See admin instructions 12/26/19 1948 12/28/19 1005   12/23/19 2000  vancomycin (VANCOCIN) IVPB 1000 mg/200 mL premix  Status:  Discontinued        1,000 mg 200 mL/hr over 60 Minutes Intravenous Every 8 hours 12/23/19 1027 12/26/19 1944   12/23/19 1300  clindamycin (CLEOCIN) IVPB 900 mg  Status:  Discontinued        900 mg 100 mL/hr over 30 Minutes Intravenous Every 8 hours 12/23/19 1205 12/31/19 1505   12/23/19 1200  metroNIDAZOLE (FLAGYL) IVPB 500 mg  Status:  Discontinued        500 mg 100 mL/hr over 60 Minutes Intravenous Every 8 hours 12/23/19 1135 12/23/19 1254   12/23/19 1045  vancomycin (VANCOREADY) IVPB 2000 mg/400 mL        2,000 mg 200 mL/hr over 120 Minutes Intravenous NOW 12/23/19 1018 12/23/19 1441   12/23/19 0300  cefTRIAXone (ROCEPHIN) 2 g in sodium chloride 0.9 % 100 mL IVPB  Status:  Discontinued        2 g 200 mL/hr over 30 Minutes Intravenous Every 24 hours 12/23/19 0236 01/06/20 0859       Objective: Vitals: Today's Vitals   01/19/20 0600 01/19/20 0700 01/19/20 0730 01/19/20 0800  BP:    126/78  Pulse: (!) 124 (!) 123 (!) 111 (!) 120  Resp: 15 (!) 32 (!) 29 (!) 27  Temp:    98.1 F (36.7 C)  TempSrc:    Oral  SpO2: 98% 100% 100% 100%  Weight:      Height:      PainSc:   7      Intake/Output Summary (Last 24 hours) at 01/19/2020 0843 Last data filed at 01/19/2020 0744 Gross per 24 hour  Intake 410 ml  Output 3700 ml  Net -3290 ml   Filed Weights   01/17/20 0500 01/18/20 0439 01/19/20 0430  Weight: 112.7 kg 109.4 kg 106.7 kg   Weight change: -2.7 kg   Intake/Output from previous day: 07/04 0701 - 07/05 0700 In: 380 [P.O.:360; I.V.:20] Out: 3700 [Urine:3400; Stool:300] Intake/Output this shift: Total I/O In: 30 [I.V.:30] Out: -   Examination: General exam: AAOx3 communicative,  not in acute distress,NAD, weak appearing. HEENT:Oral mucosa moist, Ear/Nose WNL grossly, dentition normal. Respiratory system: bilaterally clear,no wheezing or crackles,no use of accessory muscle. Cardiovascular system: S1 & S2 +, No JVD. Gastrointestinal system: Abdomen soft, NT,ND, BS+. LLQ colostomy +.FOLEY+. Nervous System:Alert, awake, moving extremities and grossly nonfocal. Extremities: No edema, distal peripheral pulses palpable.  Skin: No rashes,no icterus. MSK: Normal muscle bulk,tone, power.    Data Reviewed: I have personally reviewed following labs and imaging studies CBC: Recent Labs  Lab 01/14/20 0420 01/14/20 0420 01/15/20 0245 01/16/20 0552 01/17/20 0242 01/18/20 0500 01/19/20 0340  WBC 13.7*   < > 16.7* 12.5* 10.6* 9.0 12.3*  NEUTROABS 10.8*  --  13.4* 9.0* 7.1 6.0  --   HGB 8.6*   < > 8.6* 7.9* 7.7* 7.1* 8.7*  HCT 27.9*   < > 27.9* 26.0* 25.5* 24.1* 28.6*  MCV 95.5   < > 95.2 97.4 97.3 98.8 96.0  PLT 398   < > 380 330 341 287 308   < > = values in this interval not displayed.   Basic Metabolic Panel: Recent Labs  Lab 01/12/20 1719 01/12/20 1719 01/13/20 0200 01/13/20 1840 01/14/20 0420 01/15/20 0245 01/16/20 0552 01/17/20 0242 01/18/20 0801 01/19/20 0340  NA 142   < > 141  --    < > 142 140 138 138 134*  K 3.2*   < > 3.8  --    < > 3.3* 3.3* 3.6 3.6 4.0  CL 112*   < > 111  --    < > 109 107 99 97* 94*  CO2 24   < > 23  --    < > 25 28 29 31 31   GLUCOSE 113*   < > 132*  --    < > 138* 93 110* 142* 150*  BUN 5*   < >  7  --    < > 13 13 12 12 15   CREATININE 0.33*   < > 0.32*  --    < > <0.30* <0.30* <0.30* 0.32* 0.30*  CALCIUM 7.6*   < > 7.6*  --    < > 7.4* 7.8* 7.9* 7.9* 8.1*  MG 1.4*  --  1.8 1.7  --  1.5* 1.8  --   --   --   PHOS 3.9  --  3.6 3.0  --  2.1* 2.5  --   --   --    < > = values in this interval not displayed.   GFR: Estimated Creatinine Clearance: 104.7 mL/min (A) (by C-G formula based on SCr of 0.3 mg/dL (L)). Liver Function  Tests: Recent Labs  Lab 01/13/20 0200 01/15/20 0245 01/16/20 0552 01/17/20 0242  AST 29 20 22 27   ALT 15 11 13 14   ALKPHOS 127* 93 92 96  BILITOT 0.6 0.7 0.3 0.5  PROT 4.9* 4.3* 4.8* 5.2*  ALBUMIN 1.2* 1.1* 1.2* 1.3*   No results for input(s): LIPASE, AMYLASE in the last 168 hours. No results for input(s): AMMONIA in the last 168 hours. Coagulation Profile: No results for input(s): INR, PROTIME in the last 168 hours. Cardiac Enzymes: No results for input(s): CKTOTAL, CKMB, CKMBINDEX, TROPONINI in the last 168 hours. BNP (last 3 results) No results for input(s): PROBNP in the last 8760 hours. HbA1C: No results for input(s): HGBA1C in the last 72 hours. CBG: Recent Labs  Lab 01/18/20 1510 01/18/20 2024 01/18/20 2348 01/19/20 0320 01/19/20 0729  GLUCAP 121* 163* 131* 150* 133*   Lipid Profile: No results for input(s): CHOL, HDL, LDLCALC, TRIG, CHOLHDL, LDLDIRECT in the last 72 hours. Thyroid Function Tests: No results for input(s): TSH, T4TOTAL, FREET4, T3FREE, THYROIDAB in the last 72 hours. Anemia Panel: No results for input(s): VITAMINB12, FOLATE, FERRITIN, TIBC, IRON, RETICCTPCT in the last 72 hours. Sepsis Labs: No results for input(s): PROCALCITON, LATICACIDVEN in the last 168 hours.  No results found for this or any previous visit (from the past 240 hour(s)).    Radiology Studies: DG Abd 1 View  Result Date: 01/17/2020 CLINICAL DATA:  New feeding tube placement. EXAM: ABDOMEN - 1 VIEW COMPARISON:  01/16/2020 FINDINGS: Feeding tube is in place, tip within the stomach, redirected upon itself in the region of the fundus. Bowel gas pattern is nonobstructive. IMPRESSION: Feeding tube tip in the stomach. Electronically Signed   By: Nolon Nations M.D.   On: 01/17/2020 11:21     LOS: 57 days   Antonieta Pert, MD [facility]  01/19/2020, 8:43 AM

## 2020-01-20 LAB — BASIC METABOLIC PANEL
Anion gap: 10 (ref 5–15)
BUN: 13 mg/dL (ref 6–20)
CO2: 31 mmol/L (ref 22–32)
Calcium: 8.3 mg/dL — ABNORMAL LOW (ref 8.9–10.3)
Chloride: 93 mmol/L — ABNORMAL LOW (ref 98–111)
Creatinine, Ser: <0.3 mg/dL — ABNORMAL LOW (ref 0.44–1.00)
Glucose, Bld: 92 mg/dL (ref 70–99)
Potassium: 4.1 mmol/L (ref 3.5–5.1)
Sodium: 134 mmol/L — ABNORMAL LOW (ref 135–145)

## 2020-01-20 LAB — CBC
HCT: 26.4 % — ABNORMAL LOW (ref 36.0–46.0)
Hemoglobin: 8.1 g/dL — ABNORMAL LOW (ref 12.0–15.0)
MCH: 29.7 pg (ref 26.0–34.0)
MCHC: 30.7 g/dL (ref 30.0–36.0)
MCV: 96.7 fL (ref 80.0–100.0)
Platelets: 302 10*3/uL (ref 150–400)
RBC: 2.73 MIL/uL — ABNORMAL LOW (ref 3.87–5.11)
RDW: 22 % — ABNORMAL HIGH (ref 11.5–15.5)
WBC: 14.5 10*3/uL — ABNORMAL HIGH (ref 4.0–10.5)
nRBC: 0 % (ref 0.0–0.2)

## 2020-01-20 LAB — TSH: TSH: 1.841 u[IU]/mL (ref 0.350–4.500)

## 2020-01-20 LAB — GLUCOSE, CAPILLARY
Glucose-Capillary: 118 mg/dL — ABNORMAL HIGH (ref 70–99)
Glucose-Capillary: 119 mg/dL — ABNORMAL HIGH (ref 70–99)
Glucose-Capillary: 123 mg/dL — ABNORMAL HIGH (ref 70–99)
Glucose-Capillary: 130 mg/dL — ABNORMAL HIGH (ref 70–99)
Glucose-Capillary: 133 mg/dL — ABNORMAL HIGH (ref 70–99)
Glucose-Capillary: 137 mg/dL — ABNORMAL HIGH (ref 70–99)
Glucose-Capillary: 137 mg/dL — ABNORMAL HIGH (ref 70–99)

## 2020-01-20 MED ORDER — METOPROLOL TARTRATE 5 MG/5ML IV SOLN
5.0000 mg | INTRAVENOUS | Status: AC | PRN
  Administered 2020-01-20: 5 mg via INTRAVENOUS
  Filled 2020-01-20: qty 5

## 2020-01-20 NOTE — Progress Notes (Signed)
PROGRESS NOTE    Alyssa Carey  VHQ:[id number] DOB: 05-Jan-1971 DOA: 12/21/2019 PCP: Patient, No Pcp Per   Chef Complaints:: Alcohol withdrawal   Brief Narrative: 49 year old female who presented secondary to concern for alcohol withdrawal and quickly developed evidence of necrotizing fasciitis, emergently managed in the OR with critical care, general surgery with I&D.  Patient was in ICU needing intubation and vasopressors for septic shock underwent repeat I&D and diverting colostomy.  She has been extubated and off vasopressors.  Hydrotherapy was started.  Remains in the stepdown unit and remains critically ill with multiple medical issues.  Significant events 6/06 admission 6/08 ICU admission to OR ofr I&D, back intubated and on pressors, 6/09 started tube feeds. Still pressor dependent,kept on vent w/ anticipated return to OR planned  6/10 Back to the OR for significant debridement and also placement of diverting colostomy 6/11 Sedated, low-dose phenylephrine. CCS felt wound looking good. Sedation changed to precedex / PRN fent  6/12 more awake, on 15/5 pressure support 6/13 family discussion >> DNR, continue medical care 6//15 transfuse 1 unit PRBC; vomiting, tube feeds held 6/16 Vomiting overnight 6/17 Trial PSV 15/5 with variable Vt, remains on vasopressors. Vomiting overnight  6/18 transfuse 1 unit PRBC 6/20 Off pressors, On precedex and weaning on PSV  6/21 Weaning on PSV, planned hydrotherapy 6/23 Extubated 6/24 Transferred to [facility] service.  Subjective:  Reports he is feeling well today.  Sat up on the side of the bed for 30 minutes. Heart rate having tachycardia in the 120s No chest pain shortness of breath fever chills.  uop 2000 ml Weight down to 227 lb Leg edema still present  Assessment & Plan:  Necrotizing fasciitis of pelvic region and thigh: Patient had emergent operative intervention with incision and debridement, diverting colostomy and had multiple  rounds of IV antibiotics.  Foley in place to aid healing, wound is healing having granulation tissue on hydrotherapy Monday Wednesday Friday, being managed by wound care and general surgery and appreciate input.  Continue to augment nutritional status , NGT+. Continue vitamin A zinc and vitamin C.  Septic shock secondary to necrotizing fasciitis needing ICU admissions intubation pressors.  Resolved.  wound grew E. coli, VRE.Patient received antibiotics including linezolid clindamycin Flagyl. Last dose 6/22.  Continue wound care as #1.  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  Speech following closely.  Remains on NG tube feeding and unable to meet nutritional requirement.   Leukocytosis: WBC trending up.  Has been ranging from 9-16K.Monitor.   Recent Labs  Lab 01/16/20 0552 01/17/20 0242 01/18/20 0500 01/19/20 0340 01/20/20 0516  WBC 12.5* 10.6* 9.0 12.3* 14.5*   Hypokalemia/hypomagnesemia: Electrolytes improved.  On KCl 20 MDQ twice daily while on Lasix. Recent Labs  Lab 01/16/20 0552 01/17/20 0242 01/18/20 0801 01/19/20 0340 01/20/20 0516  K 3.3* 3.6 3.6 4.0 4.1   Acute hypoxic respiratory failure: Initially intubated and subsequently extubated.  Resolved on room air.    Severe protein calorie malnutrition with low albumin, anasarca with poor nutritional status due to severe illness.  Continue to augment nutritional status, thiamine folate zinc vitamin D/vitamin C/Vitamin A to aid in wound healing. Continue supplement with NG tube feeding and  increase the diet as per speech  Fluid overload/anasarca: Suspecting from fluid resuscitation  antibiotics and hypoalbuminemia On admission weight was 193 to 213 pounds, on 7/1 256.1 lb and started on lasix 20 mg iv bid w kcl and having good urine output- CHANGED TO lasix 40 daily 7/6-would significantly down at 227 upon.  Monitor intake output.  Pressure injury/DTI of right ear not present on admission.  Continue local supportive care and dressing.  AKI with unknown baseline 1.7 upon admission, AKI has resolved.  Renal function remains a stable tolerating Lasix.  Recent Labs  Lab 01/16/20 0552 01/17/20 0242 01/18/20 0801 01/19/20 0340 01/20/20 0516  BUN 13 12 12 15 13   CREATININE <0.30* <0.30* 0.32* 0.30* <0.30*   Intermittent hypo-glycemia: Blood sugar has improved.  Monitor.  Recent Labs  Lab 01/19/20 1554 01/19/20 1918 01/19/20 2348 01/20/20 0300 01/20/20 0834  GLUCAP 137* 139* 91 133* 137*   Anemia of critical illness/acute blood loss anemia Baseline hemoglobin 12-13 developed anemia in the setting of infection on, critical illness, bleeding, receive 3 units of PRBC so far.  Patient has rectal bleeding GI consulted and performed bedside flexible sigmoidoscopy significant for 10 cm Stercoral ulcer 6/28 .  Hemoglobin overall stable continue to monitor and transfuse if , 7 gm.   Recent Labs  Lab 01/16/20 0552 01/17/20 0242 01/18/20 0500 01/19/20 0340 01/20/20 0516  HGB 7.9* 7.7* 7.1* 8.7* 8.1*  HCT 26.0* 25.5* 24.1* 28.6* 26.4*   Morbid obesity with BMI 43.9.  With diuresis weight is improving BMI down to 38.  Will benefit weight loss and healthy lifestyle once acute issues resolve.   Tachycardia sinus in the setting of pain, anemia, wound- multiple issues. She had CT of the chest that was negative for PE right lower leg  duplex are negative for DVT.  Upper extremity swelling more on the left duplex was negative for DVT in 7/2.  Respiratory status overall is stable.  Low-dose metoprolol started, TSH stable  Erythema generalized: likely from use of Purple Wipe by  patient's family and patient's family has been educated on not to do anything without asking patient's nurses.  Overall improving.    DVT prophylaxis: Place and maintain sequential compression device Start: 01/10/20 1738.  Lovenox stopped due to bleeding from the wound Code Status: full Family Communication: plan of care discussed with patient and nursing staffs at bedside.  Status is: Inpatient  Remains inpatient appropriate because:Inpatient level of care appropriate due to severity of illness and For ongoing management wound, postop, multiple issues, poor oral intake remains tachycardic with adjusting metoprolol, and also fluid overload and getting diuresed with Lasix.  Receiving nutrition with poor intake speech following and remain on NG tube feeding.  Dispo: The patient is from: Home              Anticipated d/c is to: LTAC vs SNF- but still needing NGT for nutrition and continues to require wound care.              Anticipated d/c date is: > 3 days              Patient currently is not medically stable to d/c.  Awaiting for disposition to LTAC, issue with Medicaid.   Nutrition: Nutrition and speech are following. Diet Order            Diet full liquid Room service appropriate? Yes; Fluid consistency: Nectar Thick  Diet effective now  Nutrition Problem: Increased nutrient needs Etiology: chronic illness, wound healing (alcohol abuse; necrotizing fasciitis) Signs/Symptoms: estimated needs Interventions: Tube feeding, Prostat Body mass index is 38.98 kg/m. Pressure Ulcer: Pressure Injury 12/27/19 Ear Right Stage 3 -  Full thickness tissue loss. Subcutaneous fat may be visible but bone, tendon or muscle are NOT exposed. previously noted unstageable wound has evolved into a healing stage 3 when assessed on 7/5 (Active)  12/27/19 1200  Location: Ear  Location Orientation: Right  Staging: Stage 3 -  Full thickness tissue loss. Subcutaneous fat may be visible but bone,  tendon or muscle are NOT exposed.  Wound Description (Comments): previously noted unstageable wound has evolved into a healing stage 3 when assessed on 7/5  Present on Admission: No   Consultants:see note  Procedures:  ENDOTRACHEAL INTUBATION (6/8 >> 01/07/2020)  INCISION AND DEBRIDEMENT OF LEFT BUTTOCK AND PERINEUM (12/23/2019)  IRRIGATION AND DEBRIDEMENT OF BUTTOCKS; LAPAROSCOPIC LOOP COLOSTOMY (12/25/2019)  FLEXIBLE SIGMOIDOSCOPY (01/11/2020)   Microbiology:see note  Medications: Scheduled Meds: . sodium chloride   Intravenous Once  . ascorbic acid  500 mg Per Tube BID  . chlorhexidine  15 mL Mouth Rinse BID  . Chlorhexidine Gluconate Cloth  6 each Topical Daily  . collagenase   Topical Daily  . ergocalciferol  50,000 Units Per Tube Q Fri  . feeding supplement (PIVOT 1.5 CAL)  1,000 mL Per Tube Q24H  . feeding supplement (PRO-STAT SUGAR FREE 64)  30 mL Per Tube Daily  . ferrous sulfate  300 mg Per Tube BID WC  . folic acid  1 mg Per Tube Daily  . furosemide  40 mg Oral Daily  . insulin aspart  0-6 Units Subcutaneous Q4H  . insulin aspart  4 Units Subcutaneous Q4H  . mouth rinse  15 mL Mouth Rinse q12n4p  . melatonin  3 mg Oral QHS  . metoprolol tartrate  12.5 mg Per Tube BID  . multivitamin with minerals  1 tablet Per Tube Daily  . pantoprazole sodium  40 mg Per Tube BID  . potassium chloride  20 mEq Per Tube BID  . sodium chloride flush  10-40 mL Intracatheter Q12H  . thiamine  100 mg Per Tube Daily  . traZODone  50 mg Oral QHS  . vitamin A  20,000 Units Oral Daily  . zinc sulfate  220 mg Per Tube Daily   Continuous Infusions: . sodium chloride      Antimicrobials:  Ceftriaxone  Vancomycin  Clindamycin  Linezolid  Flagyl  Finished all antibiotic therapy. Anti-infectives (From admission, onward)   Start     Dose/Rate Route Frequency Ordered Stop   12/31/19 1600  metroNIDAZOLE (FLAGYL) IVPB 500 mg  Status:  Discontinued        500 mg 100 mL/hr over 60  Minutes Intravenous Every 8 hours 12/31/19 1505 01/06/20 0859   12/28/19 1400  vancomycin (VANCOCIN) IVPB 1000 mg/200 mL premix  Status:  Discontinued        1,000 mg 200 mL/hr over 60 Minutes Intravenous Every 24 hours 12/28/19 1005 12/28/19 1343   12/28/19 1400  linezolid (ZYVOX) IVPB 600 mg        600 mg 300 mL/hr over 60 Minutes Intravenous Every 12 hours 12/28/19 1343 01/06/20 1954   12/26/19 1948  vancomycin variable dose per unstable renal function (pharmacist dosing)  Status:  Discontinued         Does not apply See admin instructions 12/26/19 1948 12/28/19 1005   12/23/19 2000  vancomycin (VANCOCIN) IVPB  1000 mg/200 mL premix  Status:  Discontinued        1,000 mg 200 mL/hr over 60 Minutes Intravenous Every 8 hours 12/23/19 1027 12/26/19 1944   12/23/19 1300  clindamycin (CLEOCIN) IVPB 900 mg  Status:  Discontinued        900 mg 100 mL/hr over 30 Minutes Intravenous Every 8 hours 12/23/19 1205 12/31/19 1505   12/23/19 1200  metroNIDAZOLE (FLAGYL) IVPB 500 mg  Status:  Discontinued        500 mg 100 mL/hr over 60 Minutes Intravenous Every 8 hours 12/23/19 1135 12/23/19 1254   12/23/19 1045  vancomycin (VANCOREADY) IVPB 2000 mg/400 mL        2,000 mg 200 mL/hr over 120 Minutes Intravenous NOW 12/23/19 1018 12/23/19 1441   12/23/19 0300  cefTRIAXone (ROCEPHIN) 2 g in sodium chloride 0.9 % 100 mL IVPB  Status:  Discontinued        2 g 200 mL/hr over 30 Minutes Intravenous Every 24 hours 12/23/19 0236 01/06/20 0859       Objective: Vitals: Today's Vitals   01/20/20 0504 01/20/20 0600 01/20/20 0625 01/20/20 0800  BP: (!) 131/59 125/74    Pulse: (!) 123 (!) 129    Resp: (!) 34 (!) 30    Temp:    99.1 F (37.3 C)  TempSrc:    Oral  SpO2: 100% 100%    Weight:      Height:      PainSc:   6      Intake/Output Summary (Last 24 hours) at 01/20/2020 1052 Last data filed at 01/20/2020 0500 Gross per 24 hour  Intake 4051.5 ml  Output 2425 ml  Net 1626.5 ml   Filed Weights    01/18/20 0439 01/19/20 0430 01/20/20 0457  Weight: 109.4 kg 106.7 kg 103 kg   Weight change: -3.7 kg   Intake/Output from previous day: 07/05 0701 - 07/06 0700 In: 4201.5 [P.O.:700; I.V.:30; NG/GT:3471.5] Out: 2425 [Urine:2000; Stool:425] Intake/Output this shift: No intake/output data recorded.  Examination: General exam: AAOx3, obese, not in distress NAD, weak appearing. HEENT:Oral mucosa moist, Ear/Nose WNL grossly, dentition normal. Respiratory system: bilaterally clear,no wheezing or crackles,no use of accessory muscle Cardiovascular system: S1 & S2 +, No JVD,. Gastrointestinal system: Abdomen soft, NT, right side skin drain present, LLQ colostomy with stool, ND, BS+ Nervous System:Alert, awake, moving extremities and grossly nonfocal Extremities: No edema, distal peripheral pulses palpable.  Skin: No rashes,no icterus.  Buttock wound with packing and dressing intact MSK: Normal muscle bulk,tone, power Foley in place. Excerpt from previous photograph by surgery    Data Reviewed: I have personally reviewed following labs and imaging studies CBC: Recent Labs  Lab 01/14/20 0420 01/14/20 0420 01/15/20 0245 01/15/20 0245 01/16/20 0552 01/17/20 0242 01/18/20 0500 01/19/20 0340 01/20/20 0516  WBC 13.7*   < > 16.7*   < > 12.5* 10.6* 9.0 12.3* 14.5*  NEUTROABS 10.8*  --  13.4*  --  9.0* 7.1 6.0  --   --   HGB 8.6*   < > 8.6*   < > 7.9* 7.7* 7.1* 8.7* 8.1*  HCT 27.9*   < > 27.9*   < > 26.0* 25.5* 24.1* 28.6* 26.4*  MCV 95.5   < > 95.2   < > 97.4 97.3 98.8 96.0 96.7  PLT 398   < > 380   < > 330 341 287 308 302   < > = values in this interval not displayed.   Basic Metabolic Panel:  Recent Labs  Lab 01/13/20 1840 01/14/20 0420 01/15/20 0245 01/15/20 0245 01/16/20 0552 01/17/20 0242 01/18/20 0801 01/19/20 0340 01/20/20 0516  NA  --    < > 142   < > 140 138 138 134* 134*  K  --    < > 3.3*   < > 3.3* 3.6 3.6 4.0 4.1  CL  --    < > 109   < > 107 99 97* 94* 93*  CO2   --    < > 25   < > 28 29 31 31 31   GLUCOSE  --    < > 138*   < > 93 110* 142* 150* 92  BUN  --    < > 13   < > 13 12 12 15 13   CREATININE  --    < > <0.30*   < > <0.30* <0.30* 0.32* 0.30* <0.30*  CALCIUM  --    < > 7.4*   < > 7.8* 7.9* 7.9* 8.1* 8.3*  MG 1.7  --  1.5*  --  1.8  --   --   --   --   PHOS 3.0  --  2.1*  --  2.5  --   --   --   --    < > = values in this interval not displayed.   GFR: CrCl cannot be calculated (This lab value cannot be used to calculate CrCl because it is not a number: <0.30). Liver Function Tests: Recent Labs  Lab 01/15/20 0245 01/16/20 0552 01/17/20 0242  AST 20 22 27   ALT 11 13 14   ALKPHOS 93 92 96  BILITOT 0.7 0.3 0.5  PROT 4.3* 4.8* 5.2*  ALBUMIN 1.1* 1.2* 1.3*   No results for input(s): LIPASE, AMYLASE in the last 168 hours. No results for input(s): AMMONIA in the last 168 hours. Coagulation Profile: No results for input(s): INR, PROTIME in the last 168 hours. Cardiac Enzymes: No results for input(s): CKTOTAL, CKMB, CKMBINDEX, TROPONINI in the last 168 hours. BNP (last 3 results) No results for input(s): PROBNP in the last 8760 hours. HbA1C: No results for input(s): HGBA1C in the last 72 hours. CBG: Recent Labs  Lab 01/19/20 1554 01/19/20 1918 01/19/20 2348 01/20/20 0300 01/20/20 0834  GLUCAP 137* 139* 91 133* 137*   Lipid Profile: No results for input(s): CHOL, HDL, LDLCALC, TRIG, CHOLHDL, LDLDIRECT in the last 72 hours. Thyroid Function Tests: Recent Labs    01/20/20 0943  TSH 1.841   Anemia Panel: No results for input(s): VITAMINB12, FOLATE, FERRITIN, TIBC, IRON, RETICCTPCT in the last 72 hours. Sepsis Labs: No results for input(s): PROCALCITON, LATICACIDVEN in the last 168 hours.  No results found for this or any previous visit (from the past 240 hour(s)).    Radiology Studies: No results found.   LOS: 30 days   Antonieta Pert, MD [facility]  01/20/2020, 10:52 AM

## 2020-01-20 NOTE — Progress Notes (Signed)
Inpatient Rehabilitation Admissions Coordinator  Inpatient rehab consult received. I will do bedside assessment tomorrow. Please call me with any questions.  Danne Baxter, RN, MSN Rehab Admissions Coordinator [phone] 01/20/2020 4:21 PM

## 2020-01-20 NOTE — Progress Notes (Signed)
Inpatient Rehab Admissions Coordinator Note:   Per updated therapy recommendations, pt was screened for CIR candidacy by Shann Medal, PT, DPT.  At this time we are recommending a CIR consult.  I will place an order per our protocol.  Please contact me with questions.   Shann Medal, PT, DPT [phone] 01/20/20 12:09 PM

## 2020-01-20 NOTE — Progress Notes (Signed)
Physical Therapy Treatment Patient Details Name: Alyssa Carey MRN: [id number] DOB: June 12, 1971 Today's Date: 01/20/2020    History of Present Illness 49 y/o female with a heavy alcohol history admitted6/8/21  in the setting of confusion and presumed alcohol withdrawal who later developed sepsis from a gas forming wound, S/P debridement left gluteal wound for necratizing fascitis.VDRF,. Extubated 01/07/20., negative for DVT in bilateral legs and PE. Negative DVT Left and right UE    PT Comments    The patient tolerated tilt bed x 20" at 42degrees, 58 kg weight bearing. Patient performed weight shifting, noted quads engaging. Performed TB UE exercises while tilted.  BP  After 25" tilted 106/61 and at end of standing,pt c/o feeling hot with BP 82/42.  Back to 126/45 when supine. HR running in 130-144 while tilted. Will need to monitor BP closely. Patient's father and son present during the  Session.   Follow Up Recommendations  CIR     Equipment Recommendations   (tba)    Recommendations for Other Services Rehab consult     Precautions / Restrictions Precautions Precautions: Fall Precaution Comments: left gluteal wound, L colostomy,  NG tube feeding  Monitor BP when upright   Mobility  Bed Mobility Overal bed mobility: Needs Assistance Bed Mobility: Rolling;Sidelying to Sit;Sit to Sidelying Rolling: Mod assist Sidelying to sit: Max assist;+2 for physical assistance;+2 for safety/equipment;HOB elevated     Sit to sidelying: Max assist;+2 for physical assistance;+2 for safety/equipment General bed mobility comments: patient requires assistance to flex knees/ legs in prep to roll to each side. Initiates reaching for rails with min A for UB.  Mobilized legs toward bed edge with mod A, assist with trunk to sit upright with max assist. Once sitting, able to scoot to bed edge. Patient required assistance with legs back onto bed with max assistance. Able to go to right elbow then  sidelying.  Transfers                 General transfer comment: NT  Ambulation/Gait                 Stairs             Wheelchair Mobility    Modified Rankin (Stroke Patients Only)       Balance Overall balance assessment: Needs assistance Sitting-balance support: No upper extremity supported;Feet supported Sitting balance-Leahy Scale: Fair Sitting balance - Comments: sits with trunk at midline, shifts weight to each side with control, leans forward.                                    Cognition Arousal/Alertness: Awake/alert Behavior During Therapy: Flat affect Overall Cognitive Status: Impaired/Different from baseline Area of Impairment: Awareness                   Current Attention Level: Alternating   Following Commands: Follows multi-step commands with increased time       General Comments: patient initiated leaning forwar while tilted, flexing and extending knees(with straps slightly loosened) x 20      Exercises      General Comments        Pertinent Vitals/Pain Pain Assessment: Faces Faces Pain Scale: Hurts a little bit Pain Location: both ankles when tilted Pain Descriptors / Indicators: Discomfort Pain Intervention(s): Limited activity within patient's tolerance    Home Living  Prior Function            PT Goals (current goals can now be found in the care plan section) Acute Rehab PT Goals Patient Stated Goal: agreed to mobility, sitting Progress towards PT goals: Progressing toward goals    Frequency    Min 2X/week      PT Plan Current plan remains appropriate    Co-evaluation   Reason for Co-Treatment: Complexity of the patient's impairments (multi-system involvement);For patient/therapist safety;To address functional/ADL transfers   OT goals addressed during session: ADL's and self-care      AM-PAC PT "6 Clicks" Mobility   Outcome Measure  Help  needed turning from your back to your side while in a flat bed without using bedrails?: A Lot Help needed moving from lying on your back to sitting on the side of a flat bed without using bedrails?: A Lot Help needed moving to and from a bed to a chair (including a wheelchair)?: Total Help needed standing up from a chair using your arms (e.g., wheelchair or bedside chair)?: Total Help needed to walk in hospital room?: Total Help needed climbing 3-5 steps with a railing? : Total 6 Click Score: 8    End of Session Equipment Utilized During Treatment:  (Kreg tilt bed) Activity Tolerance: Patient tolerated treatment well Patient left: in bed;with call bell/phone within reach;with family/visitor present;with nursing/sitter in room Nurse Communication: Mobility status;Need for lift equipment PT Visit Diagnosis: Muscle weakness (generalized) (M62.81);Difficulty in walking, not elsewhere classified (R26.2)     Time: 1330-1400 PT Time Calculation (min) (ACUTE ONLY): 30 min  Charges:  $Therapeutic Activity: 23-37 mins                     Tresa Endo PT Acute Rehabilitation Services Pager [phone] Office [phone]    Claretha Cooper 01/20/2020, 2:30 PM

## 2020-01-20 NOTE — Progress Notes (Signed)
Occupational Therapy Treatment Patient Details Name: Alyssa Carey MRN: [id number] DOB: 1971/05/22 Today's Date: 01/20/2020    History of present illness 49 y/o female with a heavy alcohol history admitted6/8/21  in the setting of confusion and presumed alcohol withdrawal who later developed sepsis from a gas forming wound, S/P debridement left gluteal wound for necratizing fascitis.VDRF,. Extubated 01/07/20., negative for DVT in bilateral legs and PE. Negative DVT LUE.   OT comments  Patient progressing towards acute OT goals, requires mod to max A x2 to complete rolling, then side-lying to sitting with patient initiation reaching for bed rails to assist. Patient tolerate sitting EOB for approx 15 mins at supervision level without loss of balance, able to wash face, demonstrates good trunk control. Will continue to follow.   Follow Up Recommendations  CIR;SNF    Equipment Recommendations  Other (comment) (TBD)       Precautions / Restrictions Precautions Precautions: Fall Precaution Comments: left gluteal wound, L colostomy,  NG tube feeding       Mobility Bed Mobility Overal bed mobility: Needs Assistance Bed Mobility: Rolling;Sidelying to Sit;Sit to Sidelying Rolling: Mod assist Sidelying to sit: Max assist;+2 for physical assistance;+2 for safety/equipment;HOB elevated     Sit to sidelying: Max assist;+2 for physical assistance;+2 for safety/equipment General bed mobility comments: patient requires assistance to flex knees/ legs in prep to roll to each side. Initiates reaching for rails with min A for UB.  Mobilized legs toward bed edge with mod A, assist with trunk to sit upright with max assist. Once sitting, able to scoot to bed edge. Patient required assistance with legs back onto bed with max assistance. Able to go to right elbow then sidelying.  Transfers                 General transfer comment: NT    Balance Overall balance assessment: Needs  assistance Sitting-balance support: No upper extremity supported;Feet supported Sitting balance-Leahy Scale: Fair Sitting balance - Comments: sits with trunk at midline, shifts weight to each side with control, leans forward.                                   ADL either performed or assessed with clinical judgement   ADL Overall ADL's : Needs assistance/impaired     Grooming: Wash/dry face;Sitting;Supervision/safety                       Toileting- Clothing Manipulation and Hygiene: Bed level;Total assistance       Functional mobility during ADLs: +2 for physical assistance;+2 for safety/equipment;Moderate assistance;Maximal assistance                 Cognition Arousal/Alertness: Awake/alert Behavior During Therapy: Flat affect Overall Cognitive Status: No family/caregiver present to determine baseline cognitive functioning Area of Impairment: Awareness                   Current Attention Level: Alternating   Following Commands: Follows multi-step commands with increased time   Awareness: Emergent   General Comments: patient states moving to a new room soon, feels good about sitting up at side of bed today. Patient follows directions appropriately throughout session                   Pertinent Vitals/ Pain       Pain Assessment: Faces Faces Pain Scale: Hurts even more Pain Location: both  ankle with pressure against foot board. buttock is not painful in sitting per patient Pain Descriptors / Indicators: Discomfort;Moaning Pain Intervention(s): Limited activity within patient's tolerance         Frequency  Min 2X/week        Progress Toward Goals  OT Goals(current goals can now be found in the care plan section)  Progress towards OT goals: Progressing toward goals  Acute Rehab OT Goals Patient Stated Goal: agreed to mobility, sitting OT Goal Formulation: With patient Time For Goal Achievement: 02/03/20 Potential to  Achieve Goals: Good ADL Goals Pt Will Perform Grooming: with set-up;sitting Pt Will Perform Upper Body Bathing: with set-up;sitting Pt Will Perform Upper Body Dressing: with set-up;sitting Pt Will Transfer to Toilet: with mod assist;stand pivot transfer;bedside commode;with +2 assist Pt Will Perform Toileting - Clothing Manipulation and hygiene: sit to/from stand;with mod assist Pt/caregiver will Perform Home Exercise Program: Both right and left upper extremity;Increased strength  Plan Discharge plan needs to be updated    Co-evaluation    PT/OT/SLP Co-Evaluation/Treatment: Yes Reason for Co-Treatment: Complexity of the patient's impairments (multi-system involvement);For patient/therapist safety;To address functional/ADL transfers PT goals addressed during session: Mobility/safety with mobility OT goals addressed during session: ADL's and self-care      AM-PAC OT "6 Clicks" Daily Activity     Outcome Measure   Help from another person eating meals?: A Lot Help from another person taking care of personal grooming?: A Little Help from another person toileting, which includes using toliet, bedpan, or urinal?: Total Help from another person bathing (including washing, rinsing, drying)?: A Lot Help from another person to put on and taking off regular upper body clothing?: Total Help from another person to put on and taking off regular lower body clothing?: Total 6 Click Score: 10    End of Session  OT Visit Diagnosis: Muscle weakness (generalized) (M62.81)   Activity Tolerance Patient tolerated treatment well   Patient Left in bed;with call bell/phone within reach (chair position)   Nurse Communication Mobility status        Time: 0865-7846 OT Time Calculation (min): 31 min  Charges: OT General Charges $OT Visit: 1 Visit OT Treatments $Self Care/Home Management : 8-22 mins  Delbert Phenix OT Pager: [place] 01/20/2020, 11:35 AM

## 2020-01-20 NOTE — TOC Progression Note (Signed)
Transition of Care [facility]) - Progression Note    Patient Details  Name: SOWMYA PARTRIDGE MRN: [id number] Date of Birth: 27-Dec-1970  [facility] CM/SW Contact  Leeroy Cha, RN Phone Number: 01/20/2020, 8:18 AM  Clinical Narrative:    Open left gluteal wound being packed with abd pad and hving hydrotherapy to area.  New granulation tissue seen in area by pt note. Patient has been requested to financial counseling by this rn.   Expected Discharge Plan: [place] Barriers to Discharge: Continued Medical Work up  Expected Discharge Plan and Services Expected Discharge Plan: [place] In-house Referral: Development worker, community                                             Social Determinants of Health (SDOH) Interventions    Readmission Risk Interventions No flowsheet data found.

## 2020-01-20 NOTE — Progress Notes (Signed)
Physical Therapy Treatment Patient Details Name: NASHA DISS MRN: [id number] DOB: 09-20-1970 Today's Date: 01/20/2020    History of Present Illness 49 y/o female with a heavy alcohol history admitted6/8/21  in the setting of confusion and presumed alcohol withdrawal who later developed sepsis from a gas forming wound, S/P debridement left gluteal wound for necratizing fascitis.VDRF,. Extubated 01/07/20., negative for DVT in bilateral legs and PE. Negative DVT LUE.    PT Comments    The patient is making excellent progress in mobility, speech is more audible, patient interactive, states that she is moving to a new room. Patient assisted with rolling and sitting on the side of the bed. Patient sits  With good trunk control, weight shifts. Patient's strength of legs remain weak and standing is a risk. Will  Continue tilt bed standing if able to tolerate weight on the feet as patient reporting pain in both today.   Patient on RA at 100% SPO2, HR 115-143 with activity. Patient sat x 15 minutes without any UE support.  Follow Up Recommendations  CIR;SNF     Equipment Recommendations   (tba)    Recommendations for Other Services Rehab consult     Precautions / Restrictions Precautions Precautions: Fall Precaution Comments: left gluteal wound, L colostomy,  NG tube feeding    Mobility  Bed Mobility Overal bed mobility: Needs Assistance Bed Mobility: Rolling;Supine to Sit;Sidelying to Sit;Sit to Sidelying Rolling: Mod assist Sidelying to sit: Max assist;+2 for physical assistance;+2 for safety/equipment;HOB elevated     Sit to sidelying: Max assist;+2 for physical assistance;+2 for safety/equipment General bed mobility comments: patient requires assistance to flex the legs in prep to roll to each side. Able to reach for rails to self assist. Able to move legs toward bed edge, assist with trunk to sit upright with max assist. Once sitting, able to scoot to bed edge. Patient required  assistance with legs back onto bed with max assistance. Able to go to right elbow then sidelying.  Transfers                    Ambulation/Gait                 Stairs             Wheelchair Mobility    Modified Rankin (Stroke Patients Only)       Balance Overall balance assessment: Needs assistance Sitting-balance support: No upper extremity supported;Feet supported Sitting balance-Leahy Scale: Fair Sitting balance - Comments: sits with trunk at midline, shifts weight to each side with control, leans forward.                                    Cognition Arousal/Alertness: Awake/alert Behavior During Therapy: Flat affect Overall Cognitive Status: Difficult to assess Area of Impairment: Awareness                   Current Attention Level: Alternating   Following Commands: Follows multi-step commands with increased time   Awareness: Emergent   General Comments: patient readily participated in bed mobility, following directions. States that she is moving to a new room with a window. Asking where the other girl(therapist  ) is.      Exercises  LAQ x 10 each sitting on bed edge.    General Comments        Pertinent Vitals/Pain Pain Assessment: No/denies pain Faces Pain Scale:  Hurts even more Pain Location: both ankle with pressure against foot board. buttock is not painful in sitting per patient Pain Descriptors / Indicators: Discomfort;Moaning Pain Intervention(s): Limited activity within patient's tolerance;Monitored during session    Home Living                      Prior Function            PT Goals (current goals can now be found in the care plan section) Acute Rehab PT Goals Patient Stated Goal: agreed to mobility, sitting PT Goal Formulation: With patient Time For Goal Achievement: 02/03/20 Potential to Achieve Goals: Good Progress towards PT goals: Progressing toward goals;Goals met and updated -  see care plan    Frequency           PT Plan Discharge plan needs to be updated    Co-evaluation PT/OT/SLP Co-Evaluation/Treatment: Yes Reason for Co-Treatment: For patient/therapist safety;To address functional/ADL transfers PT goals addressed during session: Mobility/safety with mobility OT goals addressed during session: ADL's and self-care      AM-PAC PT "6 Clicks" Mobility   Outcome Measure  Help needed turning from your back to your side while in a flat bed without using bedrails?: A Lot Help needed moving from lying on your back to sitting on the side of a flat bed without using bedrails?: A Lot Help needed moving to and from a bed to a chair (including a wheelchair)?: Total Help needed standing up from a chair using your arms (e.g., wheelchair or bedside chair)?: Total Help needed to walk in hospital room?: Total Help needed climbing 3-5 steps with a railing? : Total 6 Click Score: 8    End of Session   Activity Tolerance: Patient tolerated treatment well Patient left: in bed;with call bell/phone within reach (in chair position) Nurse Communication: Mobility status;Need for lift equipment PT Visit Diagnosis: Muscle weakness (generalized) (M62.81);Difficulty in walking, not elsewhere classified (R26.2)     Time: 0962-8366 PT Time Calculation (min) (ACUTE ONLY): 34 min  Charges:  $Therapeutic Activity: 8-22 mins                    Tresa Endo PT Acute Rehabilitation Services Pager [phone] Office [phone]    Claretha Cooper 01/20/2020, 9:53 AM

## 2020-01-21 LAB — BASIC METABOLIC PANEL
Anion gap: 9 (ref 5–15)
BUN: 14 mg/dL (ref 6–20)
CO2: 29 mmol/L (ref 22–32)
Calcium: 8.3 mg/dL — ABNORMAL LOW (ref 8.9–10.3)
Chloride: 96 mmol/L — ABNORMAL LOW (ref 98–111)
Creatinine, Ser: 0.31 mg/dL — ABNORMAL LOW (ref 0.44–1.00)
GFR calc Af Amer: >60 mL/min (ref >60)
GFR calc non Af Amer: >60 mL/min (ref >60)
Glucose, Bld: 120 mg/dL — ABNORMAL HIGH (ref 70–99)
Potassium: 4.2 mmol/L (ref 3.5–5.1)
Sodium: 134 mmol/L — ABNORMAL LOW (ref 135–145)

## 2020-01-21 LAB — CBC
HCT: 24.2 % — ABNORMAL LOW (ref 36.0–46.0)
Hemoglobin: 7.5 g/dL — ABNORMAL LOW (ref 12.0–15.0)
MCH: 29.9 pg (ref 26.0–34.0)
MCHC: 31 g/dL (ref 30.0–36.0)
MCV: 96.4 fL (ref 80.0–100.0)
Platelets: 306 10*3/uL (ref 150–400)
RBC: 2.51 MIL/uL — ABNORMAL LOW (ref 3.87–5.11)
RDW: 21.3 % — ABNORMAL HIGH (ref 11.5–15.5)
WBC: 12.4 10*3/uL — ABNORMAL HIGH (ref 4.0–10.5)
nRBC: 0 % (ref 0.0–0.2)

## 2020-01-21 LAB — GLUCOSE, CAPILLARY
Glucose-Capillary: 76 mg/dL (ref 70–99)
Glucose-Capillary: 129 mg/dL — ABNORMAL HIGH (ref 70–99)
Glucose-Capillary: 139 mg/dL — ABNORMAL HIGH (ref 70–99)
Glucose-Capillary: 139 mg/dL — ABNORMAL HIGH (ref 70–99)
Glucose-Capillary: 120 mg/dL — ABNORMAL HIGH (ref 70–99)
Glucose-Capillary: 125 mg/dL — ABNORMAL HIGH (ref 70–99)

## 2020-01-21 MED ORDER — ORAL CARE MOUTH RINSE
15.0000 mL | Freq: Two times a day (BID) | OROMUCOSAL | Status: DC
  Administered 2020-01-21 – 2020-02-03 (×25): 15 mL via OROMUCOSAL

## 2020-01-21 NOTE — Progress Notes (Signed)
Physical Therapy Treatment Patient Details Name: Alyssa Carey MRN: [id number] DOB: 1970/12/09 Today's Date: 01/21/2020    History of Present Illness 49 y/o female with a heavy alcohol history admitted6/8/21  in the setting of confusion and presumed alcohol withdrawal who later developed sepsis from a gas forming wound, S/P debridement left gluteal wound for necratizing fascitis.VDRF,. Extubated 01/07/20., negative for DVT in bilateral legs and PE. Negative DVT Left and right UE    PT Comments    The patient is very participatory in bed mobility. Assisted to recliner via maxisky. Patient stood from recliner at [facility] [address] x 3, Decreased ability to stand erect. Bilateral legs remain weak to power up. Patient did briefly straighten knees on 3 trial. Patient continues to make excellent progress in functional mobility.   Follow Up Recommendations  CIR     Equipment Recommendations   (tba)    Recommendations for Other Services Rehab consult     Precautions / Restrictions Precautions Precautions: Fall Precaution Comments: left gluteal wound, L colostomy,  NG tube feeding Restrictions Weight Bearing Restrictions: No    Mobility  Bed Mobility   Bed Mobility: Rolling Rolling: Mod assist;+2 for safety/equipment;+2 for physical assistance         General bed mobility comments: Used maxi sky to transfer patient from bed to recliner.  Transfers Overall transfer level: Needs assistance   Transfers: Sit to/from Stand Sit to Stand: +2 physical assistance;+2 safety/equipment;Max assist         General transfer comment: max x 2 to stand from recliner with use of stedy. patient able to hold onto stedy bar to assist with pulling up. performed x 3 today. Patient exhibited difficulty extending knees fully. Due to position of bar patient pulling up on - unable to assist with upper extremities once near standing position.  Ambulation/Gait                 Stairs              Wheelchair Mobility    Modified Rankin (Stroke Patients Only)       Balance   Sitting-balance support: No upper extremity supported;Feet supported Sitting balance-Leahy Scale: Fair Sitting balance - Comments: sits with trunk at midline, shifts weight to each side with control, leans forward.                                    Cognition Arousal/Alertness: Awake/alert Behavior During Therapy: WFL for tasks assessed/performed Overall Cognitive Status: Impaired/Different from baseline                         Following Commands: Follows multi-step commands with increased time              Exercises Shoulder Exercises Shoulder Flexion: AROM;Supine;20 reps;Right;Left Shoulder External Rotation: AROM;20 reps;Left;Right;Strengthening;Theraband Theraband Level (Shoulder External Rotation): Level 2 (Red) Elbow Flexion: AROM;Right;20 reps;Supine (LUE; 20 reps, theraband, seated position) Other Exercises Other Exercises: Scapular Retraction with BUEs w/ orange theraband x 15    General Comments        Pertinent Vitals/Pain Pain Assessment: Faces Faces Pain Scale: Hurts a little bit Pain Location: bilateral ankles Pain Descriptors / Indicators: Discomfort Pain Intervention(s): Monitored during session;Premedicated before session;Limited activity within patient's tolerance    Home Living  Prior Function            PT Goals (current goals can now be found in the care plan section) Acute Rehab PT Goals Patient Stated Goal: To stand Progress towards PT goals: Progressing toward goals    Frequency    Min 2X/week      PT Plan Current plan remains appropriate    Co-evaluation PT/OT/SLP Co-Evaluation/Treatment: Yes Reason for Co-Treatment: Complexity of the patient's impairments (multi-system involvement);For patient/therapist safety;To address functional/ADL transfers PT goals addressed during session:  Mobility/safety with mobility OT goals addressed during session: Strengthening/ROM      AM-PAC PT "6 Clicks" Mobility   Outcome Measure  Help needed turning from your back to your side while in a flat bed without using bedrails?: A Lot Help needed moving from lying on your back to sitting on the side of a flat bed without using bedrails?: A Lot Help needed moving to and from a bed to a chair (including a wheelchair)?: Total Help needed standing up from a chair using your arms (e.g., wheelchair or bedside chair)?: Total Help needed to walk in hospital room?: Total Help needed climbing 3-5 steps with a railing? : Total 6 Click Score: 8    End of Session Equipment Utilized During Treatment: Gait belt Activity Tolerance: Patient tolerated treatment well Patient left: in chair;with call bell/phone within reach;with chair alarm set Nurse Communication: Mobility status;Need for lift equipment PT Visit Diagnosis: Muscle weakness (generalized) (M62.81);Difficulty in walking, not elsewhere classified (R26.2)     Time: 5329-9242 PT Time Calculation (min) (ACUTE ONLY): 52 min  Charges:  $Therapeutic Activity: 23-37 mins                     Tresa Endo PT Acute Rehabilitation Services Pager [phone] Office [phone]    Claretha Cooper 01/21/2020, 3:16 PM

## 2020-01-21 NOTE — Progress Notes (Addendum)
Inpatient Rehabilitation Admissions Coordinator  Inpatient rehab consult received. I met with patient at bedside for rehab assessment,. We discussed goals and expectations of Cir admissions. Patient currently not at a level to tolerate the intensity of 3 hrs per day of therapy. She is aware. Noted max assist with STEDY today and then to recliner with Maxi sky for 20 minutes. I will contact her son to discuss possible caregiver supports and to answer questions. Pt is aware that I am recommending SNF rehab at this time, but will follow her progress in house to assess if she progresses to CIR level if caregiver supports confirmed. She needs prolonged rehab recovery for severe debility , wound care and nutritional efforts. Please call me with any questions.  Danne Baxter, RN, MSN Rehab Admissions Coordinator [phone] 01/21/2020 4:24 PM

## 2020-01-21 NOTE — Progress Notes (Signed)
10 Days Post-Op    CC: confusion, ETOH use, weakness, decubitus with hx of fall  Subjective: Undergoing hydro, picture below.  Site looks really good except for one area that has some fat necrosis.  She is having rectal drainage into the site, that has to be cleaned also.  Objective: Vital signs in last 24 hours: Temp:  [98.1 F (36.7 C)-99.1 F (37.3 C)] 98.5 F (36.9 C) (07/07 0730) Pulse Rate:  [116-143] 116 (07/07 0900) Resp:  [12-31] 24 (07/07 0900) BP: (98-117)/(58-69) 117/69 (07/07 0900) SpO2:  [97 %-100 %] 100 % (07/07 0900) Last BM Date: 01/21/20  Intake/Output from previous day: 07/06 0701 - 07/07 0700 In: 910 [NG/GT:910] Out: 1450 [Urine:1250; Stool:200] Intake/Output this shift: Total I/O In: 150 [P.O.:120; I.V.:30] Out: -   General appearance: alert, cooperative, no distress and uncomfortable with hydro Rx  Wound is pictured below.  Will continue current Rx   01/21/20 Continued improvement.  Just the one area with some ongoing fat necrosis.      12/28/19   Lab Results:  Recent Labs    01/20/20 0516 01/21/20 0823  WBC 14.5* 12.4*  HGB 8.1* 7.5*  HCT 26.4* 24.2*  PLT 302 306    BMET Recent Labs    01/20/20 0516 01/21/20 0823  NA 134* 134*  K 4.1 4.2  CL 93* 96*  CO2 31 29  GLUCOSE 92 120*  BUN 13 14  CREATININE <0.30* 0.31*  CALCIUM 8.3* 8.3*   PT/INR No results for input(s): LABPROT, INR in the last 72 hours.  Recent Labs  Lab 01/15/20 0245 01/16/20 0552 01/17/20 0242  AST 20 22 27   ALT 11 13 14   ALKPHOS 93 92 96  BILITOT 0.7 0.3 0.5  PROT 4.3* 4.8* 5.2*  ALBUMIN 1.1* 1.2* 1.3*     Lipase  No results found for: LIPASE   Medications: . sodium chloride   Intravenous Once  . ascorbic acid  500 mg Per Tube BID  . Chlorhexidine Gluconate Cloth  6 each Topical Daily  . collagenase   Topical Daily  . ergocalciferol  50,000 Units Per Tube Q Fri  . feeding supplement (PIVOT 1.5 CAL)  1,000 mL Per Tube Q24H  . feeding supplement  (PRO-STAT SUGAR FREE 64)  30 mL Per Tube Daily  . ferrous sulfate  300 mg Per Tube BID WC  . folic acid  1 mg Per Tube Daily  . furosemide  40 mg Oral Daily  . insulin aspart  0-6 Units Subcutaneous Q4H  . insulin aspart  4 Units Subcutaneous Q4H  . mouth rinse  15 mL Mouth Rinse BID  . melatonin  3 mg Oral QHS  . metoprolol tartrate  12.5 mg Per Tube BID  . multivitamin with minerals  1 tablet Per Tube Daily  . pantoprazole sodium  40 mg Per Tube BID  . potassium chloride  20 mEq Per Tube BID  . sodium chloride flush  10-40 mL Intracatheter Q12H  . thiamine  100 mg Per Tube Daily  . traZODone  50 mg Oral QHS  . vitamin A  20,000 Units Oral Daily  . zinc sulfate  220 mg Per Tube Daily   . sodium chloride     Assessment/Plan Acute hypoxic respiratory failure -now on nasal cannula Hepatic encephalopathy Alcohol abuse Thrombocytopenia  HTN Anemia of critical illness and ABL anemia - had some bleeding from wound and rectum over the weekend, s/p GI scope which showed a rectal ulcer, bleeding seems to have  stopped now  Morbid obesity - BMI 43.90  Necrotizing fasciitis of left buttock -S/Pincision and debridement of skin, subcutaneous tissue, fascia and muscle 15x15x6cm, left buttock5/8 Dr. 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 Redmond Pulling - Ostomy functioning. WOCN following -Continue hydrotherapy MWF - continue santyl to help with superior edge of wound, TID dressing changes - will see again next week  FEN: IV fluids/full liquids ID:  None DVT:SCD/heparin held for rectal bleeding Follow up:  Colostomy reversal >>Dr. Redmond Pulling  Plan:  Continue current dressing changes and wound care.  Referral to Dr. Kalman Shan after discharge.         LOS: 31 days    Alyssa Carey 01/21/2020 Please see Amion

## 2020-01-21 NOTE — Progress Notes (Signed)
  Speech Language Pathology Treatment: Dysphagia  Patient Details Name: Alyssa Carey MRN: [id number] DOB: Apr 19, 1971 Today's Date: 01/21/2020 Time: 9622-2979 SLP Time Calculation (min) (ACUTE ONLY): 47 min  Assessment / Plan / Recommendation Clinical Impression  Skilled session focusing on vocal cord adduction, vocal intensity, cough response and compensatory strategies with po's. This SLP does not have clinical point of reference for vocal intensity comparison (other than documentation) and vocalization continues to improve and was intelligible approximately 70% of the time. Pt is currently on a nectar thick full liquids and therapist facilitated intake using with chocolate pudding thinned to a nectar consistency as well as trials of honey consistency. Suspect honey thick yielded increased pharyngeal reside and pt cleared throat soon after swallows on 4/5 trials which were less frequent and intense with the nectar. Continue with nectar for now and she is nearing recommendation to repeat MBS (Friday will be 2 weeks; repeat next week if appropriate).  Inspiratory muscle strength training introduced to pt with verbal/visual demonstration to facilitate use of respiratory/deglutory muscles for effective/safe swallows. She needed moderate feedback before using accurately with trainer set to 35 cm H2o Valve taped to wall with instructions and pt encouraged to use 3 times a day for 10 repetitions. Also explained to pt's RN.    HPI HPI: 49 year old with alcohol abuse, alcohol withdrawal, necrotizing fasciitis of left buttock status post debridement. Acute respiratory failure secondary to septic shock. Intubated from 6/8 to 6/23.   Pt underwent MBS on Saturday 01/09/2020 with findings of infrequent and trace, silent aspiration of nectar and thin.  Recommended to start diet of dys1/small, controlled sips of nectar.  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  ENT advised pt high aspiration risk.  Follow up indicated.      SLP Plan  Continue with current plan of care       Recommendations  Diet recommendations: Nectar-thick liquid (full liquids nectar thick) Liquids provided via: Teaspoon;Cup Medication Administration: Crushed with puree Supervision: Patient able to self feed;Intermittent supervision to cue for compensatory strategies Compensations: Slow rate;Small sips/bites;Effortful swallow;Clear throat intermittently Postural Changes and/or Swallow Maneuvers: Seated upright 90 degrees                Oral Care Recommendations: Oral care QID Follow up Recommendations: Other (comment) (TBD) SLP Visit Diagnosis: Dysphagia, oropharyngeal phase (R13.12) Plan: Continue with current plan of care                       Houston Siren 01/21/2020, 2:30 PM  Orbie Pyo Colvin Caroli.Ed Risk analyst [phone] Office [phone]

## 2020-01-21 NOTE — Progress Notes (Signed)
PROGRESS NOTE    Alyssa Carey  FXT:[id number] DOB: 01-May-1971 DOA: 12/21/2019 PCP: Patient, No Pcp Per   Chef Complaints:: Alcohol withdrawal   Brief Narrative: 49 year old female who presented secondary to concern for alcohol withdrawal and quickly developed evidence of necrotizing fasciitis, emergently managed in the OR with critical care, general surgery with I&D.  Patient was in ICU needing intubation and vasopressors for septic shock underwent repeat I&D and diverting colostomy.  She has been extubated and off vasopressors.  Hydrotherapy was started.  Remains in the stepdown unit.  Wound is slowly healing being managed by surgery wound care.  Followed by speech therapy oral intake inadequate on modified diet and on NG tube feeding.  Remains very deconditioned and frail  Significant events 6/06 admission 6/08 ICU admission to OR ofr I&D, back intubated and on pressors, 6/09 started tube feeds. Still pressor dependent,kept on vent w/ anticipated return to OR planned  6/10 Back to the OR for significant debridement and also placement of diverting colostomy 6/11 Sedated, low-dose phenylephrine. CCS felt wound looking good. Sedation changed to precedex / PRN fent  6/12 more awake, on 15/5 pressure support 6/13 family discussion >> DNR, continue medical care 6//15 transfuse 1 unit PRBC; vomiting, tube feeds held 6/16 Vomiting overnight 6/17 Trial PSV 15/5 with variable Vt, remains on vasopressors. Vomiting overnight  6/18 transfuse 1 unit PRBC 6/20 Off pressors, On precedex and weaning on PSV  6/21 Weaning on PSV, planned hydrotherapy 6/23 Extubated 6/24 Transferred to [facility].  Subjective: Resting well, alert,awake communicative. Po intake picking up No acute events.  Tachycardic, but afebrile. HR at 114 now Wbc donw to 12k. uop 2000  > 1250 ml  Weight was down to 227 lb 7/6 (was wt 256 lb 6/29, on admission 192-213. Leg edema still present rn at bedside with meds- asked  to check wt.  Assessment & Plan:  Necrotizing fasciitis of pelvic region and thigh: Patient had emergent operative intervention with incision and debridement, diverting colostomy and had multiple rounds of IV antibiotics.  Foley in place to promote healing.  Appreciate surgery input on Monday Wednesday Friday hydrotherapy, getting hydrotherapy today wound looks really good except one area with fat necrosis.  Augment nutrition,continue vitamins , sinc  Septic shock secondary to #- resovled. Wound grew E. coli, VRE.Patient received antibiotics including linezolid clindamycin Flagyl. Last dose 6/22.   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  Speech following closely.  Speech following closely continue to supplement nutrition with NG tube feeding while on restrictive diet.   Leukocytosis: WBC has been hovering in 9 to 16 K.Monitor.  Recent Labs  Lab 01/17/20 0242 01/18/20 0500 01/19/20 0340 01/20/20 0516 01/21/20 0823  WBC 10.6* 9.0 12.3* 14.5* 12.4*   Hypokalemia/hypomagnesemia: Electrolyte level stable.   Cont KCl 20 MDQ twice daily while on Lasix. Recent Labs  Lab 01/17/20 0242 01/18/20 0801 01/19/20 0340 01/20/20 0516 01/21/20 0823  K 3.6 3.6 4.0 4.1 4.2   Acute hypoxic respiratory failure: Resolved.   Severe protein calorie malnutrition with low albumin, anasarca with poor nutritional status due to severe illness: Continue tube feeding multivitamins, appreciate dietitian input.  Continue NG tube.  Fluid overload/anasarca: Suspecting from  fluid resuscitation antibiotics and hypoalbuminemia On admission weight was 193 to 213 pounds, on  7/1:256.1 lb and started on lasix with improving weight and change to oral Lasix continue the same.  Pressure injury/DTI of right ear not present on admission.  Continue local supportive care and dressing.  AKI with unknown baseline 1.7 upon admission: Renal function is stable as below.  Tolerating Lasix.  Recent Labs  Lab 01/17/20 0242 01/18/20 0801 01/19/20 0340 01/20/20 0516 01/21/20 0823  BUN 12 12 15 13 14   CREATININE <0.30* 0.32* 0.30* <0.30* 0.31*   Intermittent hypo-glycemia: Blood sugar has improved.  Monitor.  Recent Labs  Lab 01/20/20 2010 01/20/20 2345 01/21/20 0345 01/21/20 0739 01/21/20 1143  GLUCAP 130* 118* 76 129* 139*   Anemia of critical illness/acute blood loss anemia Baseline hemoglobin 12-13 developed anemia in the setting of infection critical illness bleeding.Patient has rectal bleeding GI consulted and performed bedside flexible sigmoidoscopy significant for 10 cm Stercoral ulcer 6/28 .   Received 3 units PRBC.  Hemoglobin overall stable monitor and transfuse if less than 7 g.   Recent Labs  Lab 01/17/20 0242 01/18/20 0500 01/19/20 0340 01/20/20 0516 01/21/20 0823  HGB 7.7* 7.1* 8.7* 8.1* 7.5*  HCT 25.5* 24.1* 28.6* 26.4* 24.2*   Morbid obesity with BMI 43.9.  With diuresis weight is improving BMI down to 38.  Will benefit weight loss and healthy lifestyle once acute issues resolve.   Tachycardia sinus in the setting of pain, anemia, wound- multiple issues. She had CT of the chest that was negative for PE right lower leg  duplex are negative for DVT.  Upper extremity swelling more on the left duplex was negative for DVT in 7/2.  No chest pain is stable respiratory status on room air.  Continue on metoprolol.  TSH is stable.   Erythema generalized: likely from use of Purple Wipe by patient's family and patient's family has been educated on not to do anything without asking patient's nurses.cont supportive care  DVT prophylaxis: Place and maintain  sequential compression device Start: 01/10/20 1738.  Lovenox stopped due to bleeding from the wound Code Status: full Family Communication: plan of care discussed with patient and nursing staffs at bedside.  Status is: Inpatient  Remains inpatient appropriate because:Inpatient level of care appropriate due to severity of illness and For ongoing management wound, postop, multiple issues, poor oral intake remains tachycardic with adjusting metoprolol, and also fluid overload and getting diuresed with Lasix.  Receiving nutrition with poor intake speech following and remain on NG tube feeding.  Dispo: The patient is from: Home              Anticipated d/c is to: LTAC vs SNF vs Rehab.  Remains on NGT feeding.              Anticipated d/c date is: > 3 days              Patient currently is not medically stable.  Inpatient rehab has been consulted.  Okay to discharge to [place] if tolerating tube feeding once okay with surgery. Nutrition: Nutrition and speech are following. Diet Order            Diet full liquid Room service appropriate? Yes; Fluid consistency: Nectar Thick  Diet effective now                 Nutrition Problem: Increased nutrient needs Etiology: chronic illness, wound healing (alcohol abuse; necrotizing fasciitis) Signs/Symptoms: estimated needs Interventions: Tube feeding, Prostat  Body mass index is 38.98 kg/m. Pressure Ulcer: Pressure Injury 12/27/19 Ear Right Stage 3 -  Full thickness tissue loss. Subcutaneous fat may be visible but bone, tendon or muscle are NOT exposed. previously noted unstageable wound has evolved into a healing stage 3 when assessed on 7/5 (Active)  12/27/19 1200  Location: Ear  Location Orientation: Right  Staging: Stage 3 -  Full thickness tissue loss. Subcutaneous fat may be visible but bone, tendon or muscle are NOT exposed.  Wound Description (Comments): previously noted unstageable wound has evolved into a healing stage 3 when assessed on  7/5  Present on Admission: No   Consultants:see note  Procedures:  ENDOTRACHEAL INTUBATION (6/8 >> 01/07/2020)  INCISION AND DEBRIDEMENT OF LEFT BUTTOCK AND PERINEUM (12/23/2019)  IRRIGATION AND DEBRIDEMENT OF BUTTOCKS; LAPAROSCOPIC LOOP COLOSTOMY (12/25/2019)  FLEXIBLE SIGMOIDOSCOPY (01/11/2020)   Microbiology:see note  Medications: Scheduled Meds: . sodium chloride   Intravenous Once  . ascorbic acid  500 mg Per Tube BID  . Chlorhexidine Gluconate Cloth  6 each Topical Daily  . collagenase   Topical Daily  . ergocalciferol  50,000 Units Per Tube Q Fri  . feeding supplement (PIVOT 1.5 CAL)  1,000 mL Per Tube Q24H  . feeding supplement (PRO-STAT SUGAR FREE 64)  30 mL Per Tube Daily  . ferrous sulfate  300 mg Per Tube BID WC  . folic acid  1 mg Per Tube Daily  . furosemide  40 mg Oral Daily  . insulin aspart  0-6 Units Subcutaneous Q4H  . insulin aspart  4 Units Subcutaneous Q4H  . mouth rinse  15 mL Mouth Rinse BID  . melatonin  3 mg Oral QHS  . metoprolol tartrate  12.5 mg Per Tube BID  . multivitamin with minerals  1 tablet Per Tube Daily  . pantoprazole sodium  40 mg Per Tube BID  . potassium chloride  20 mEq Per Tube BID  . sodium chloride flush  10-40 mL Intracatheter Q12H  . thiamine  100 mg Per Tube Daily  . traZODone  50 mg Oral QHS  . vitamin A  20,000 Units Oral Daily  . zinc sulfate  220 mg Per Tube Daily   Continuous Infusions: . sodium chloride      Antimicrobials:  Ceftriaxone  Vancomycin  Clindamycin  Linezolid  Flagyl  Finished all antibiotic therapy. Anti-infectives (From admission, onward)   Start     Dose/Rate Route Frequency Ordered Stop   12/31/19 1600  metroNIDAZOLE (FLAGYL) IVPB 500 mg  Status:  Discontinued        500 mg 100 mL/hr over 60 Minutes Intravenous Every 8 hours 12/31/19 1505 01/06/20 0859   12/28/19 1400  vancomycin (VANCOCIN) IVPB 1000 mg/200 mL premix  Status:  Discontinued        1,000 mg 200 mL/hr over 60 Minutes  Intravenous Every 24 hours 12/28/19 1005 12/28/19 1343   12/28/19 1400  linezolid (ZYVOX) IVPB 600 mg        600 mg 300 mL/hr over 60 Minutes Intravenous Every 12 hours 12/28/19 1343 01/06/20 1954   12/26/19 1948  vancomycin variable dose per unstable renal function (pharmacist dosing)  Status:  Discontinued         Does not apply See admin instructions 12/26/19 1948 12/28/19 1005   12/23/19 2000  vancomycin (VANCOCIN) IVPB 1000 mg/200 mL premix  Status:  Discontinued        1,000 mg 200 mL/hr over 60 Minutes Intravenous Every 8 hours 12/23/19 1027 12/26/19 1944  12/23/19 1300  clindamycin (CLEOCIN) IVPB 900 mg  Status:  Discontinued        900 mg 100 mL/hr over 30 Minutes Intravenous Every 8 hours 12/23/19 1205 12/31/19 1505   12/23/19 1200  metroNIDAZOLE (FLAGYL) IVPB 500 mg  Status:  Discontinued        500 mg 100 mL/hr over 60 Minutes Intravenous Every 8 hours 12/23/19 1135 12/23/19 1254   12/23/19 1045  vancomycin (VANCOREADY) IVPB 2000 mg/400 mL        2,000 mg 200 mL/hr over 120 Minutes Intravenous NOW 12/23/19 1018 12/23/19 1441   12/23/19 0300  cefTRIAXone (ROCEPHIN) 2 g in sodium chloride 0.9 % 100 mL IVPB  Status:  Discontinued        2 g 200 mL/hr over 30 Minutes Intravenous Every 24 hours 12/23/19 0236 01/06/20 0859       Objective: Vitals: Today's Vitals   01/21/20 1004 01/21/20 1100 01/21/20 1136 01/21/20 1200  BP:      Pulse:  (!) 113  (!) 113  Resp:  (!) 22    Temp:  (!) 97.2 F (36.2 C)    TempSrc:  Oral    SpO2:  99%  100%  Weight:      Height:      PainSc: 5  6  5       Intake/Output Summary (Last 24 hours) at 01/21/2020 1307 Last data filed at 01/21/2020 0850 Gross per 24 hour  Intake 1060 ml  Output 1450 ml  Net -390 ml   Filed Weights   01/18/20 0439 01/19/20 0430 01/20/20 0457  Weight: 109.4 kg 106.7 kg 103 kg   Weight change:    Intake/Output from previous day: 07/06 0701 - 07/07 0700 In: 910 [NG/GT:910] Out: 1450 [Urine:1250;  Stool:200] Intake/Output this shift: Total I/O In: 150 [P.O.:120; I.V.:30] Out: -   Examination:  General exam: AAOx3, weak NAD, weak appearing. HEENT:Oral mucosa moist, Ear/Nose WNL grossly, dentition normal. Respiratory system: bilaterally clear,no wheezing or crackles,no use of accessory muscle Cardiovascular system: S1 & S2 +, No JVD,. Gastrointestinal system: Abdomen soft, NT, LLQ colostomy with stool, ND, BS+ Nervous System:Alert, awake, moving extremities and grossly nonfocal Extremities: b/l UE/LE edema- improving.distal peripheral pulses palpable.  Skin: No rashes,no icterus. MSK: Normal muscle bulk,tone, power    Data Reviewed: I have personally reviewed following labs and imaging studies CBC: Recent Labs  Lab 01/15/20 0245 01/15/20 0245 01/16/20 0552 01/16/20 0552 01/17/20 0242 01/18/20 0500 01/19/20 0340 01/20/20 0516 01/21/20 0823  WBC 16.7*   < > 12.5*   < > 10.6* 9.0 12.3* 14.5* 12.4*  NEUTROABS 13.4*  --  9.0*  --  7.1 6.0  --   --   --   HGB 8.6*   < > 7.9*   < > 7.7* 7.1* 8.7* 8.1* 7.5*  HCT 27.9*   < > 26.0*   < > 25.5* 24.1* 28.6* 26.4* 24.2*  MCV 95.2   < > 97.4   < > 97.3 98.8 96.0 96.7 96.4  PLT 380   < > 330   < > 341 287 308 302 306   < > = values in this interval not displayed.   Basic Metabolic Panel: Recent Labs  Lab 01/15/20 0245 01/15/20 0245 01/16/20 0552 01/16/20 0552 01/17/20 0242 01/18/20 0801 01/19/20 0340 01/20/20 0516 01/21/20 0823  NA 142   < > 140   < > 138 138 134* 134* 134*  K 3.3*   < > 3.3*   < >  3.6 3.6 4.0 4.1 4.2  CL 109   < > 107   < > 99 97* 94* 93* 96*  CO2 25   < > 28   < > 29 31 31 31 29   GLUCOSE 138*   < > 93   < > 110* 142* 150* 92 120*  BUN 13   < > 13   < > 12 12 15 13 14   CREATININE <0.30*   < > <0.30*   < > <0.30* 0.32* 0.30* <0.30* 0.31*  CALCIUM 7.4*   < > 7.8*   < > 7.9* 7.9* 8.1* 8.3* 8.3*  MG 1.5*  --  1.8  --   --   --   --   --   --   PHOS 2.1*  --  2.5  --   --   --   --   --   --    < > =  values in this interval not displayed.   GFR: Estimated Creatinine Clearance: 102.6 mL/min (A) (by C-G formula based on SCr of 0.31 mg/dL (L)). Liver Function Tests: Recent Labs  Lab 01/15/20 0245 01/16/20 0552 01/17/20 0242  AST 20 22 27   ALT 11 13 14   ALKPHOS 93 92 96  BILITOT 0.7 0.3 0.5  PROT 4.3* 4.8* 5.2*  ALBUMIN 1.1* 1.2* 1.3*   No results for input(s): LIPASE, AMYLASE in the last 168 hours. No results for input(s): AMMONIA in the last 168 hours. Coagulation Profile: No results for input(s): INR, PROTIME in the last 168 hours. Cardiac Enzymes: No results for input(s): CKTOTAL, CKMB, CKMBINDEX, TROPONINI in the last 168 hours. BNP (last 3 results) No results for input(s): PROBNP in the last 8760 hours. HbA1C: No results for input(s): HGBA1C in the last 72 hours. CBG: Recent Labs  Lab 01/20/20 2010 01/20/20 2345 01/21/20 0345 01/21/20 0739 01/21/20 1143  GLUCAP 130* 118* 76 129* 139*   Lipid Profile: No results for input(s): CHOL, HDL, LDLCALC, TRIG, CHOLHDL, LDLDIRECT in the last 72 hours. Thyroid Function Tests: Recent Labs    01/20/20 0943  TSH 1.841   Anemia Panel: No results for input(s): VITAMINB12, FOLATE, FERRITIN, TIBC, IRON, RETICCTPCT in the last 72 hours. Sepsis Labs: No results for input(s): PROCALCITON, LATICACIDVEN in the last 168 hours.  No results found for this or any previous visit (from the past 240 hour(s)).    Radiology Studies: No results found.   LOS: 31 days   Antonieta Pert, MD [facility]  01/21/2020, 1:07 PM

## 2020-01-21 NOTE — Progress Notes (Signed)
Occupational Therapy Treatment Patient Details Name: Alyssa Carey MRN: [id number] DOB: 1971-01-12 Today's Date: 01/21/2020    History of present illness 49 y/o female with a heavy alcohol history admitted6/8/21  in the setting of confusion and presumed alcohol withdrawal who later developed sepsis from a gas forming wound, S/P debridement left gluteal wound for necratizing fascitis.VDRF,. Extubated 01/07/20., negative for DVT in bilateral legs and PE. Negative DVT Left and right UE   OT comments  Patient performed upper extremity exercises predominantly supine in bed to maintain ROM of upper extremities and improve upper body strength. Patient's LUE demonstrates improve ROM and less edematous than previous visits. Patient completed bicep curls shoulder retraction reps seated in chair - and tolerated well. Patient transferred to recliner via maxi sky and able to perform standing x 3 (max x 2) x 3 sit to stands. On first two stand patient unable to fully extend knees - on third stand, third assistant assisted with patient's knee extension. Due to patient's own height and the height of the stedy bar patient unable to use her upper extremities to push herself into erect standing. Patient motivated and progressing towards goals. Patient would be a good candidate for aggressive therapy at CIR at discharge.   Follow Up Recommendations  CIR;SNF    Equipment Recommendations  Other (comment)    Recommendations for Other Services      Precautions / Restrictions Precautions Precautions: Fall Precaution Comments: left gluteal wound, L colostomy,  NG tube feeding Restrictions Weight Bearing Restrictions: No       Mobility Bed Mobility   Bed Mobility: Rolling Rolling: Mod assist;+2 for safety/equipment;+2 for physical assistance         General bed mobility comments: Used maxi sky to transfer patient from bed to recliner.  Transfers Overall transfer level: Needs assistance   Transfers:  Sit to/from Stand Sit to Stand: +2 physical assistance;+2 safety/equipment;Max assist         General transfer comment: max x 2 to stand from recliner with use of stedy. patient able to hold onto stedy bar to assist with pulling up. performed x 3 today. Patient exhibited difficulty extending knees fully. Due to position of bar patient pulling up on - unable to assist with upper extremities once near standing position.    Balance   Sitting-balance support: No upper extremity supported;Feet supported Sitting balance-Leahy Scale: Fair Sitting balance - Comments: sits with trunk at midline, shifts weight to each side with control, leans forward.                                   ADL either performed or assessed with clinical judgement   ADL                                               Vision       Perception     Praxis      Cognition Arousal/Alertness: Awake/alert Behavior During Therapy: WFL for tasks assessed/performed Overall Cognitive Status: Impaired/Different from baseline                         Following Commands: Follows multi-step commands with increased time                Exercises Shoulder  Exercises Shoulder Flexion: AROM;Supine;20 reps;Right;Left Shoulder External Rotation: AROM;20 reps;Left;Right;Strengthening;Theraband Theraband Level (Shoulder External Rotation): Level 2 (Red) Elbow Flexion: AROM;Right;20 reps;Supine (LUE; 20 reps, theraband, seated position) Other Exercises Other Exercises: Scapular Retraction with BUEs w/ orange theraband x 15   Shoulder Instructions       General Comments      Pertinent Vitals/ Pain       Pain Assessment: Faces Faces Pain Scale: Hurts a little bit Pain Location: bilateral ankles Pain Descriptors / Indicators: Discomfort Pain Intervention(s): Monitored during session;Premedicated before session;Limited activity within patient's tolerance  Home Living                                           Prior Functioning/Environment              Frequency  Min 2X/week        Progress Toward Goals  OT Goals(current goals can now be found in the care plan section)  Progress towards OT goals: Progressing toward goals  Acute Rehab OT Goals Patient Stated Goal: To stand OT Goal Formulation: With patient Time For Goal Achievement: 02/03/20 Potential to Achieve Goals: Good  Plan Discharge plan needs to be updated    Co-evaluation    PT/OT/SLP Co-Evaluation/Treatment: Yes Reason for Co-Treatment: Complexity of the patient's impairments (multi-system involvement);For patient/therapist safety;To address functional/ADL transfers PT goals addressed during session: Mobility/safety with mobility OT goals addressed during session: Strengthening/ROM      AM-PAC OT "6 Clicks" Daily Activity     Outcome Measure   Help from another person eating meals?: A Lot Help from another person taking care of personal grooming?: A Lot Help from another person toileting, which includes using toliet, bedpan, or urinal?: Total Help from another person bathing (including washing, rinsing, drying)?: A Lot Help from another person to put on and taking off regular upper body clothing?: Total Help from another person to put on and taking off regular lower body clothing?: Total 6 Click Score: 9    End of Session Equipment Utilized During Treatment: Gait belt  OT Visit Diagnosis: Muscle weakness (generalized) (M62.81)   Activity Tolerance Patient tolerated treatment well   Patient Left in chair;with call bell/phone within reach;with chair alarm set   Nurse Communication Mobility status        Time: 5631-4970 OT Time Calculation (min): 33 min  Charges: OT General Charges $OT Visit: 1 Visit OT Treatments $Therapeutic Exercise: 8-22 mins  Derl Barrow, OTR/L [place]  Office [phone] Pager: [facility] 01/21/2020, 3:15 PM

## 2020-01-21 NOTE — Progress Notes (Signed)
   01/21/20 1515  Hydrotherapy note  Subjective Assessment  Subjective how doe it look  Patient and Family Stated Goals agreed to wound care today, nods yes   Prior Treatments s/p surgical I & D x2, diverting colostomy  Evaluation and Treatment  Evaluation and Treatment Procedures Explained to Patient/Family Yes  Evaluation and Treatment Procedures agreed to  Wound / Incision (Open or Dehisced) 12/27/19 Non-pressure wound Buttocks Left *PT ONLY* open wound L gluteal area  Date First Assessed/Time First Assessed: 12/27/19 1410   Wound Type: Non-pressure wound  Location: Buttocks  Location Orientation: Left  Wound Description (Comments): *PT ONLY* open wound L gluteal area  Present on Admission: (c)   Dressing Type ABD;Gauze (Comment) (santyl)  Dressing Changed New  Dressing Status Clean  Dressing Change Frequency Twice a day  % Wound base Red or Granulating 80%  % Wound base Yellow/Fibrinous Exudate 20%  Peri-wound Assessment Erythema (blanchable)  Wound Length (cm) 17 cm  Wound Width (cm) 14 cm  Wound Depth (cm) 5 cm  Wound Volume (cm^3) 1190 cm^3  Wound Surface Area (cm^2) 238 cm^2  Tunneling (cm) 4  Undermining (cm) 4  Margins Unattached edges (unapproximated)  Drainage Description Serosanguineous  Non-staged Wound Description Full thickness  Treatment Cleansed;Debridement (Selective);Hydrotherapy (Pulse lavage);Packing (Saline gauze)  Hydrotherapy  Pulsed lavage therapy - wound location L gluteal area   Pulsed Lavage with Suction (psi) 12 psi  Pulsed Lavage with Suction - Normal Saline Used 1000 mL  Pulsed Lavage Tip Tip with splash shield  Selective Debridement  Selective Debridement - Location left glute  Selective Debridement - Tools Used Forceps;Scalpel  Selective Debridement - Tissue Removed necrotic fat, slough   Wound Therapy - Assess/Plan/Recommendations  Wound Therapy - Clinical Statement patient alert, Will jennings, PA in , debrided necrotic fat. recommends  continuation ofPLS 3 x week and debridement and Santyl.   Wound Therapy - Functional Problem List immobility,   Factors Delaying/Impairing Wound Healing Substance abuse;Immobility;Multiple medical problems  Hydrotherapy Plan Debridement;Dressing change;Pulsatile lavage with suction  Wound Therapy - Frequency 3X / week  Wound Therapy - Current Recommendations PT  Wound Therapy - Follow Up Recommendations  (CIR)  Wound Therapy Goals - Improve the function of patient's integumentary system by progressing the wound(s) through the phases of wound healing by:  Decrease Necrotic Tissue to 100  Decrease Necrotic Tissue - Progress Progressing toward goal  Increase Granulation Tissue to 0  Increase Granulation Tissue - Progress Progressing toward goal  Improve Drainage Characteristics Min  Goals/treatment plan/discharge plan were made with and agreed upon by patient/family Yes  Time For Goal Achievement 2 weeks  Wound Therapy - Potential for Goals Good  [place] Pager [phone] Office [phone]

## 2020-01-22 LAB — BASIC METABOLIC PANEL
Anion gap: 9 (ref 5–15)
BUN: 14 mg/dL (ref 6–20)
CO2: 29 mmol/L (ref 22–32)
Calcium: 8.5 mg/dL — ABNORMAL LOW (ref 8.9–10.3)
Chloride: 95 mmol/L — ABNORMAL LOW (ref 98–111)
Creatinine, Ser: <0.3 mg/dL — ABNORMAL LOW (ref 0.44–1.00)
Glucose, Bld: 145 mg/dL — ABNORMAL HIGH (ref 70–99)
Potassium: 4 mmol/L (ref 3.5–5.1)
Sodium: 133 mmol/L — ABNORMAL LOW (ref 135–145)

## 2020-01-22 LAB — CBC
HCT: 24.5 % — ABNORMAL LOW (ref 36.0–46.0)
Hemoglobin: 7.4 g/dL — ABNORMAL LOW (ref 12.0–15.0)
MCH: 29.6 pg (ref 26.0–34.0)
MCHC: 30.2 g/dL (ref 30.0–36.0)
MCV: 98 fL (ref 80.0–100.0)
Platelets: 306 10*3/uL (ref 150–400)
RBC: 2.5 MIL/uL — ABNORMAL LOW (ref 3.87–5.11)
RDW: 21.2 % — ABNORMAL HIGH (ref 11.5–15.5)
WBC: 10.6 10*3/uL — ABNORMAL HIGH (ref 4.0–10.5)
nRBC: 0 % (ref 0.0–0.2)

## 2020-01-22 LAB — GLUCOSE, CAPILLARY
Glucose-Capillary: 106 mg/dL — ABNORMAL HIGH (ref 70–99)
Glucose-Capillary: 107 mg/dL — ABNORMAL HIGH (ref 70–99)
Glucose-Capillary: 108 mg/dL — ABNORMAL HIGH (ref 70–99)
Glucose-Capillary: 114 mg/dL — ABNORMAL HIGH (ref 70–99)
Glucose-Capillary: 139 mg/dL — ABNORMAL HIGH (ref 70–99)
Glucose-Capillary: 140 mg/dL — ABNORMAL HIGH (ref 70–99)

## 2020-01-22 NOTE — Progress Notes (Signed)
Physical Therapy Treatment Patient Details Name: Alyssa Carey MRN: [id number] DOB: 1971/07/13 Today's Date: 01/22/2020    History of Present Illness 49 y/o female with a heavy alcohol history admitted6/8/21  in the setting of confusion and presumed alcohol withdrawal who later developed sepsis from a gas forming wound, S/P debridement left gluteal wound for necratizing fascitis.VDRF,. Extubated 01/07/20., negative for DVT in bilateral legs and PE. Negative DVT Left and right UE    PT Comments    Patient participated in bed mobility and sitting on bed edge. Patient declined to use tilt bed for standing stating her ankles were sore. Patient's speech is fast and quiet and difficult to understand about her meeting with rehab liason about  Where she lives. Uncertain what patient was referring. Also stating sChe wanted to get  Into car with RN and this  PT and go get a milkshake. Patient could benefit from cognitive evaluation most likely. Continue PT for tilt bed and progressive standing. Remains an excellent rehab candidate.patient participated for 45 " in therapy session.  Follow Up Recommendations  CIR     Equipment Recommendations       Recommendations for Other Services Rehab consult     Precautions / Restrictions Precautions Precautions: Fall Precaution Comments: left gluteal wound, L colostomy,  NG tube feeding    Mobility  Bed Mobility Overal bed mobility: Needs Assistance Bed Mobility: Supine to Sit;Sit to Supine     Supine to sit: Mod assist Sit to supine: Mod assist   General bed mobility comments: assist with legs to initate moving to bed edge, cues to move to sidelying and able to sit upright ,  assist to scoot  buttocks to bed edge with pad. Assist with each leg back onto bed. Patient sitting in long sitting and able to scoot buttocks up towards [facility] with mod assist.  Transfers                 General transfer comment: NT-declined OOB  Ambulation/Gait                  Stairs             Wheelchair Mobility    Modified Rankin (Stroke Patients Only)       Balance Overall balance assessment: Needs assistance Sitting-balance support: No upper extremity supported;Feet supported Sitting balance-Leahy Scale: Fair Sitting balance - Comments: sits with trunk at midline, shifts weight to each side with control, leans forward. sat x 15 "                                    Cognition Arousal/Alertness: Awake/alert Behavior During Therapy: WFL for tasks assessed/performed Overall Cognitive Status: Impaired/Different from baseline Area of Impairment: Awareness;Safety/judgement                   Current Attention Level: Alternating   Following Commands: Follows multi-step commands with increased time Safety/Judgement: Decreased awareness of deficits Awareness: Emergent   General Comments: patient speaking fast and very soft. Difficult to understand. Patient  declined to stand with tilt bed and declined OOB. some nion sensible conversation  vs patient making a joke about [employer] copy) the PT and her getting into the car and going for a milkshake. RN had just told patient her milkshake was in the freezer.      Exercises General Exercises - Lower Extremity Ankle Circles/Pumps: AROM;10 reps;Both;Seated Long Arc  Quad: AROM;15 reps;Both    General Comments        Pertinent Vitals/Pain Faces Pain Scale: Hurts little more Pain Location: bilateral ankles Pain Descriptors / Indicators: Discomfort Pain Intervention(s): Monitored during session    Home Living                      Prior Function            PT Goals (current goals can now be found in the care plan section) Progress towards PT goals: Progressing toward goals    Frequency    Min 2X/week      PT Plan Current plan remains appropriate    Co-evaluation              AM-PAC PT "6 Clicks" Mobility   Outcome Measure  Help  needed turning from your back to your side while in a flat bed without using bedrails?: A Lot Help needed moving from lying on your back to sitting on the side of a flat bed without using bedrails?: A Lot Help needed moving to and from a bed to a chair (including a wheelchair)?: Total Help needed standing up from a chair using your arms (e.g., wheelchair or bedside chair)?: Total Help needed to walk in hospital room?: Total Help needed climbing 3-5 steps with a railing? : Total 6 Click Score: 8    End of Session   Activity Tolerance: Patient tolerated treatment well Patient left: in bed;with call bell/phone within reach Nurse Communication: Mobility status;Need for lift equipment PT Visit Diagnosis: Muscle weakness (generalized) (M62.81);Difficulty in walking, not elsewhere classified (R26.2)     Time: 5366-4403 PT Time Calculation (min) (ACUTE ONLY): 45 min  Charges:  $Therapeutic Activity: 38-52 mins                     Tresa Endo PT Acute Rehabilitation Services Pager [phone] Office [phone]    Claretha Cooper 01/22/2020, 2:13 PM

## 2020-01-22 NOTE — Progress Notes (Signed)
  Speech Language Pathology Treatment: Dysphagia  Patient Details Name: Alyssa Carey MRN: [id number] DOB: Aug 04, 1970 Today's Date: 01/22/2020 Time: 2119-4174 SLP Time Calculation (min) (ACUTE ONLY): 17 min  Assessment / Plan / Recommendation Clinical Impression  Pt was seen for dysphagia tx with emphasis primarily on therapeutic exercise, although pt did consume ice chip trials intermittently during session. Under skilled observation, no overt s/s of aspiration were noted, although aspiration on previous MBS was silent. Her voice is still dysphonic but she is able to produce phonation, and she self-reports improvement. SLP re-calibrated IMT device as pt could not complete a single repetition at current settings. Trainer was adjusted to 22 cm H20, where pt then performed 10 repetitions with Mod cues provided for more accurate use (lip placement, lip seal, inhale instead of exhale). Education was reinforced about current recommendations and POC.    HPI HPI: 49 year old with alcohol abuse, alcohol withdrawal, necrotizing fasciitis of left buttock status post debridement. Acute respiratory failure secondary to septic shock. Intubated from 6/8 to 6/23.   Pt underwent MBS on Saturday 01/09/2020 with findings of infrequent and trace, silent aspiration of nectar and thin.  Recommended to start diet of dys1/small, controlled sips of nectar.  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  ENT advised pt high aspiration risk.  Follow up indicated.      SLP Plan  Continue with current plan of care       Recommendations  Diet recommendations: Nectar-thick liquid Liquids provided via: Cup;Straw Medication Administration: Crushed with  puree Supervision: Patient able to self feed;Intermittent supervision to cue for compensatory strategies Compensations: Slow rate;Small sips/bites;Effortful swallow;Clear throat intermittently Postural Changes and/or Swallow Maneuvers: Seated upright 90 degrees                Oral Care Recommendations: Oral care QID Follow up Recommendations: Other (comment) SLP Visit Diagnosis: Dysphagia, oropharyngeal phase (R13.12) Plan: Continue with current plan of care       GO                Osie Bond., M.A. [place] Acute Rehabilitation Services Pager [phone] Office [phone]  01/22/2020, 10:38 AM

## 2020-01-22 NOTE — Progress Notes (Signed)
PROGRESS NOTE    Alyssa Carey  SHF:[id number] DOB: [birth year] DOA: 12/21/2019 PCP: Patient, No Pcp Per   Chef Complaints:: Alcohol withdrawal   Brief Narrative:49 year old female who presented secondary to concern for alcohol withdrawal and quickly developed evidence of necrotizing fasciitis, emergently managed in the OR with critical care, general surgery with I&D.  Patient was in ICU needing intubation and vasopressors for septic shock underwent repeat I&D and diverting colostomy.  She has been extubated and off vasopressors.  Hydrotherapy was started.  Remains in the stepdown unit.  Wound is slowly healing being managed by surgery wound care.  Followed by speech therapy oral intake inadequate on modified diet and on NG tube feeding.  Remains very deconditioned and frail  Significant events 6/06 admission 6/08 ICU admission to OR ofr I&D, back intubated and on pressors, 6/09 started tube feeds. Still pressor dependent,kept on vent w/ anticipated return to OR planned  6/10 Back to the OR for significant debridement and also placement of diverting colostomy 6/11 Sedated, low-dose phenylephrine. CCS felt wound looking good. Sedation changed to precedex / PRN fent  6/12 more awake, on 15/5 pressure support 6/13 family discussion >> DNR, continue medical care 6//15 transfuse 1 unit PRBC; vomiting, tube feeds held 6/16 Vomiting overnight 6/17 Trial PSV 15/5 with variable Vt, remains on vasopressors. Vomiting overnight  6/18 transfuse 1 unit PRBC 6/20 Off pressors, On precedex and weaning on PSV  6/21 Weaning on PSV, planned hydrotherapy 6/23 Extubated 6/24 Transferred to [facility] service.  Subjective:  Tachy ongoing-chronic in 110s On RA. Moving more and tolerating TF, colostomy putting out well . Weight was down to 227 lb 7/6 (was wt 256 lb 6/29, on admission 192-213. speech saw her and remains on full liquid nectar thick and improving swallow per nursing Patient reports she is  overall feeling much better. Reports she has been talking/updating to her son who visited yesterday.  Assessment & Plan:  Necrotizing fasciitis of pelvic region and thigh: Patient had emergent operative intervention with incision and debridement, diverting colostomy and had multiple rounds of IV antibiotics (last dose 6/22)-and her wound culture grew E. coli and VRE, .  Foley in place to promote healing.  Surgery is following intermittently and patient is on hydrotherapy Monday Wednesday Friday healing well.  Ongoing nutrition continue multivitamins and zinc.  Septic shock secondary to #- resovled.  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  Speech following closely continue on current full liquid nectar thick liquid diet augment nutrition with NG tube while oral intake is improving.   Leukocytosis: WBC has been hovering in 9 to 16 K.Monitor.  She is afebrile. Recent Labs  Lab 01/18/20 0500 01/19/20 0340 01/20/20 0516 01/21/20 0823 01/22/20 0439  WBC 9.0 12.3* 14.5* 12.4* 10.6*   Hypokalemia/hypomagnesemia: Potassium normalized.  On M EQ 20 KCl twice daily while on Lasix.  Acute hypoxic respiratory failure: Resolved.   Severe protein calorie malnutrition with low albumin, anasarca with poor nutritional status due to severe illness: Continue tube feeding multivitamins, appreciate dietitian input.  Continue NG tube.  Fluid overload/anasarca: Suspecting from fluid resuscitation antibiotics and hypoalbuminemia On admission weight was 193 to 213 pounds, on 7/1:256.1 lb and started on lasix with improving weight.  Continue on oral  Lasix, weight is down 220 pound  Pressure injury/DTI of right ear not present on  admission.  Continue local supportive care and dressing.  AKI with unknown baseline 1.7 upon admission: Renal function is stable.  Tolerating Lasix.   Recent Labs  Lab 01/18/20 0801 01/19/20 0340 01/20/20 0516 01/21/20 0823 01/22/20 0439  BUN 12 15 13 14 14   CREATININE 0.32* 0.30* <0.30* 0.31* <0.30*   Intermittent hypo-glycemia: Blood sugar has improved.  Monitor.  Recent Labs  Lab 01/21/20 1607 01/21/20 1941 01/21/20 2307 01/22/20 0355 01/22/20 0816  GLUCAP 139* 120* 125* 108* 140*   Anemia of critical illness/acute blood loss anemia Baseline hemoglobin 12-13 developed anemia in the setting of infection critical illness bleeding.Patient has rectal bleeding GI consulted and performed bedside flexible sigmoidoscopy significant for 10 cm Stercoral ulcer 6/28 .   Received 3 units PRBC.  Monitor hemoglobin, transfuse if less than 7 g. Recent Labs  Lab 01/18/20 0500 01/19/20 0340 01/20/20 0516 01/21/20 0823 01/22/20 0439  HGB 7.1* 8.7* 8.1* 7.5* 7.4*  HCT 24.1* 28.6* 26.4* 24.2* 24.5*   Morbid obesity with BMI 43.9.  With diuresis weight is improving BMI down to 37.  Will benefit from weight loss and health lifestyle and outpatient follow-up.    Sinus tachycardia patient heart rate has been elevated in 100 220s sometime with 5 days of admission currently rate.  She has had extensive evaluation negative for DVT in lower extremities and upper extremities.  She had CT of the chest negative for PE.  TSH is normal.  Continue metoprolol low-dose and adjust slowly.  Patient reports she does have baseline tachycardia at home.  Erythema generalized: likely from use of Purple Wipe by patient's family and patient's family has been educated on not to do anything without asking patient's nurses.cont supportive care.  No new complaints.  DVT prophylaxis: Place and maintain sequential compression device Start: 01/10/20 1738.  Lovenox stopped due to bleeding from the wound Code Status:  full Family Communication: plan of care discussed with patient and nursing staffs at bedside.  Status is: Inpatient  Remains inpatient appropriate because:Inpatient level of care appropriate due to severity of illness and For ongoing management wound, postop, multiple issues, poor oral intake remains tachycardic with adjusting metoprolol, and also fluid overload and getting diuresed with Lasix.  Receiving nutrition with poor intake speech following and remain on NG tube feeding.  Dispo: The patient is from: Home              Anticipated d/c is to: LTAC vs SNF vs Rehab.  I was evaluated the patient.                Anticipated d/c date is: > 3 days              Patient currently is not medically stable.  Inpatient rehab has been consulted.  Okay to move the patient to the progressive unit from stepdown.  Again noted that he ,MEW score will fire read frequently due to her hr and rr and not new.  Nutrition: Nutrition and speech are following. Diet Order            Diet full liquid Room service appropriate? Yes; Fluid consistency: Nectar Thick  Diet effective now                 Nutrition Problem: Increased nutrient needs Etiology: chronic illness, wound healing (alcohol abuse; necrotizing fasciitis) Signs/Symptoms: estimated needs Interventions: Tube feeding, Prostat Body mass index is 37.88  kg/m. Pressure Ulcer: Pressure Injury 12/27/19 Ear Right Stage 3 -  Full thickness tissue loss. Subcutaneous fat may be visible but bone, tendon or muscle are NOT exposed. previously noted unstageable wound has evolved into a healing stage 3 when assessed on 7/5 (Active)  12/27/19 1200  Location: Ear  Location Orientation: Right  Staging: Stage 3 -  Full thickness tissue loss. Subcutaneous fat may be visible but bone, tendon or muscle are NOT exposed.  Wound Description (Comments): previously noted unstageable wound has evolved into a healing stage 3 when assessed on 7/5  Present on Admission: No    Consultants:see note  Procedures:  ENDOTRACHEAL INTUBATION (6/8 >> 01/07/2020)  INCISION AND DEBRIDEMENT OF LEFT BUTTOCK AND PERINEUM (12/23/2019)  IRRIGATION AND DEBRIDEMENT OF BUTTOCKS; LAPAROSCOPIC LOOP COLOSTOMY (12/25/2019)  FLEXIBLE SIGMOIDOSCOPY (01/11/2020)   Microbiology:see note  Medications: Scheduled Meds: . sodium chloride   Intravenous Once  . ascorbic acid  500 mg Per Tube BID  . Chlorhexidine Gluconate Cloth  6 each Topical Daily  . collagenase   Topical Daily  . ergocalciferol  50,000 Units Per Tube Q Fri  . feeding supplement (PIVOT 1.5 CAL)  1,000 mL Per Tube Q24H  . feeding supplement (PRO-STAT SUGAR FREE 64)  30 mL Per Tube Daily  . ferrous sulfate  300 mg Per Tube BID WC  . folic acid  1 mg Per Tube Daily  . furosemide  40 mg Oral Daily  . insulin aspart  0-6 Units Subcutaneous Q4H  . insulin aspart  4 Units Subcutaneous Q4H  . mouth rinse  15 mL Mouth Rinse BID  . melatonin  3 mg Oral QHS  . metoprolol tartrate  12.5 mg Per Tube BID  . multivitamin with minerals  1 tablet Per Tube Daily  . pantoprazole sodium  40 mg Per Tube BID  . potassium chloride  20 mEq Per Tube BID  . sodium chloride flush  10-40 mL Intracatheter Q12H  . thiamine  100 mg Per Tube Daily  . traZODone  50 mg Oral QHS  . vitamin A  20,000 Units Oral Daily  . zinc sulfate  220 mg Per Tube Daily   Continuous Infusions: . sodium chloride      Antimicrobials:  Ceftriaxone  Vancomycin  Clindamycin  Linezolid  Flagyl  Finished all antibiotic therapy. Anti-infectives (From admission, onward)   Start     Dose/Rate Route Frequency Ordered Stop   12/31/19 1600  metroNIDAZOLE (FLAGYL) IVPB 500 mg  Status:  Discontinued        500 mg 100 mL/hr over 60 Minutes Intravenous Every 8 hours 12/31/19 1505 01/06/20 0859   12/28/19 1400  vancomycin (VANCOCIN) IVPB 1000 mg/200 mL premix  Status:  Discontinued        1,000 mg 200 mL/hr over 60 Minutes Intravenous Every 24 hours  12/28/19 1005 12/28/19 1343   12/28/19 1400  linezolid (ZYVOX) IVPB 600 mg        600 mg 300 mL/hr over 60 Minutes Intravenous Every 12 hours 12/28/19 1343 01/06/20 1954   12/26/19 1948  vancomycin variable dose per unstable renal function (pharmacist dosing)  Status:  Discontinued         Does not apply See admin instructions 12/26/19 1948 12/28/19 1005   12/23/19 2000  vancomycin (VANCOCIN) IVPB 1000 mg/200 mL premix  Status:  Discontinued        1,000 mg 200 mL/hr over 60 Minutes Intravenous Every 8 hours 12/23/19 1027 12/26/19 1944   12/23/19 1300  clindamycin (CLEOCIN) IVPB 900 mg  Status:  Discontinued        900 mg 100 mL/hr over 30 Minutes Intravenous Every 8 hours 12/23/19 1205 12/31/19 1505   12/23/19 1200  metroNIDAZOLE (FLAGYL) IVPB 500 mg  Status:  Discontinued        500 mg 100 mL/hr over 60 Minutes Intravenous Every 8 hours 12/23/19 1135 12/23/19 1254   12/23/19 1045  vancomycin (VANCOREADY) IVPB 2000 mg/400 mL        2,000 mg 200 mL/hr over 120 Minutes Intravenous NOW 12/23/19 1018 12/23/19 1441   12/23/19 0300  cefTRIAXone (ROCEPHIN) 2 g in sodium chloride 0.9 % 100 mL IVPB  Status:  Discontinued        2 g 200 mL/hr over 30 Minutes Intravenous Every 24 hours 12/23/19 0236 01/06/20 0859       Objective: Vitals: Today's Vitals   01/22/20 0416 01/22/20 0734 01/22/20 0800 01/22/20 0807  BP:   (!) 112/91   Pulse:   (!) 117   Resp:   (!) 27   Temp:   98.2 F (36.8 C)   TempSrc:   Oral   SpO2:   100%   Weight: 100.1 kg     Height:      PainSc:  Asleep  6     Intake/Output Summary (Last 24 hours) at 01/22/2020 0855 Last data filed at 01/22/2020 0821 Gross per 24 hour  Intake 3002.33 ml  Output 3105 ml  Net -102.67 ml   Filed Weights   01/20/20 0457 01/21/20 1500 01/22/20 0416  Weight: 103 kg 100.1 kg 100.1 kg   Weight change:    Intake/Output from previous day: 07/07 0701 - 07/08 0700 In: 2485 [P.O.:120; I.V.:30; NG/GT:2335] Out: 2935 [Urine:2290;  Stool:645] Intake/Output this shift: Total I/O In: 667.3 [P.O.:100; NG/GT:567.3] Out: 170 [Urine:170]  Examination: General exam: AAOx3, obese, not in distress looks pleasant and upbeat HEENT:Oral mucosa moist, Ear/Nose WNL grossly, dentition normal. Respiratory system: bilaterally clear,no wheezing or crackles,no use of accessory muscle Cardiovascular system: S1 & S2 +, No JVD,. Gastrointestinal system: Abdomen soft, NT,ND, BS+.  Left colostomy in place with stool. Nervous System:Alert, awake, moving extremities and grossly nonfocal Extremities:UE/LE edema but much better overall.Distal peripheral pulses palpable.  Skin: No rashes,no icterus. MSK: Normal muscle bulk,tone, power Foley+     Data Reviewed: I have personally reviewed following labs and imaging studies CBC: Recent Labs  Lab 01/16/20 0552 01/16/20 0552 01/17/20 0242 01/17/20 0242 01/18/20 0500 01/19/20 0340 01/20/20 0516 01/21/20 0823 01/22/20 0439  WBC 12.5*   < > 10.6*   < > 9.0 12.3* 14.5* 12.4* 10.6*  NEUTROABS 9.0*  --  7.1  --  6.0  --   --   --   --   HGB 7.9*   < > 7.7*   < > 7.1* 8.7* 8.1* 7.5* 7.4*  HCT 26.0*   < > 25.5*   < > 24.1* 28.6* 26.4* 24.2* 24.5*  MCV 97.4   < > 97.3   < > 98.8 96.0 96.7 96.4 98.0  PLT 330   < > 341   < > 287 308 302 306 306   < > = values in this interval not displayed.   Basic Metabolic Panel: Recent Labs  Lab 01/16/20 0552 01/17/20 0242 01/18/20 0801 01/19/20 0340 01/20/20 0516 01/21/20 0823 01/22/20 0439  NA 140   < > 138 134* 134* 134* 133*  K 3.3*   < > 3.6 4.0 4.1 4.2 4.0  CL 107   < > 97* 94* 93* 96* 95*  CO2 28   < > 31 31 31 29 29   GLUCOSE 93   < > 142* 150* 92 120* 145*  BUN 13   < > 12 15 13 14 14   CREATININE <0.30*   < > 0.32* 0.30* <0.30* 0.31* <0.30*  CALCIUM 7.8*   < > 7.9* 8.1* 8.3* 8.3* 8.5*  MG 1.8  --   --   --   --   --   --   PHOS 2.5  --   --   --   --   --   --    < > = values in this interval not displayed.   GFR: CrCl cannot be  calculated (This lab value cannot be used to calculate CrCl because it is not a number: <0.30). Liver Function Tests: Recent Labs  Lab 01/16/20 0552 01/17/20 0242  AST 22 27  ALT 13 14  ALKPHOS 92 96  BILITOT 0.3 0.5  PROT 4.8* 5.2*  ALBUMIN 1.2* 1.3*   No results for input(s): LIPASE, AMYLASE in the last 168 hours. No results for input(s): AMMONIA in the last 168 hours. Coagulation Profile: No results for input(s): INR, PROTIME in the last 168 hours. Cardiac Enzymes: No results for input(s): CKTOTAL, CKMB, CKMBINDEX, TROPONINI in the last 168 hours. BNP (last 3 results) No results for input(s): PROBNP in the last 8760 hours. HbA1C: No results for input(s): HGBA1C in the last 72 hours. CBG: Recent Labs  Lab 01/21/20 1607 01/21/20 1941 01/21/20 2307 01/22/20 0355 01/22/20 0816  GLUCAP 139* 120* 125* 108* 140*   Lipid Profile: No results for input(s): CHOL, HDL, LDLCALC, TRIG, CHOLHDL, LDLDIRECT in the last 72 hours. Thyroid Function Tests: Recent Labs    01/20/20 0943  TSH 1.841   Anemia Panel: No results for input(s): VITAMINB12, FOLATE, FERRITIN, TIBC, IRON, RETICCTPCT in the last 72 hours. Sepsis Labs: No results for input(s): PROCALCITON, LATICACIDVEN in the last 168 hours.  No results found for this or any previous visit (from the past 240 hour(s)).    Radiology Studies: No results found.   LOS: 32 days   Antonieta Pert, MD [facility]  01/22/2020, 8:55 AM

## 2020-01-22 NOTE — Progress Notes (Signed)
Patient arrived to the unit after 1830.  Patients vitals taken and heart monitor with continuous o2 monitor placed.  Patient resting comfortably in bed with call bell within reach.

## 2020-01-23 LAB — CBC
HCT: 25.4 % — ABNORMAL LOW (ref 36.0–46.0)
Hemoglobin: 7.7 g/dL — ABNORMAL LOW (ref 12.0–15.0)
MCH: 29.5 pg (ref 26.0–34.0)
MCHC: 30.3 g/dL (ref 30.0–36.0)
MCV: 97.3 fL (ref 80.0–100.0)
Platelets: 386 10*3/uL (ref 150–400)
RBC: 2.61 MIL/uL — ABNORMAL LOW (ref 3.87–5.11)
RDW: 20.4 % — ABNORMAL HIGH (ref 11.5–15.5)
WBC: 11.3 10*3/uL — ABNORMAL HIGH (ref 4.0–10.5)
nRBC: 0 % (ref 0.0–0.2)

## 2020-01-23 LAB — BASIC METABOLIC PANEL
Anion gap: 13 (ref 5–15)
BUN: 13 mg/dL (ref 6–20)
CO2: 26 mmol/L (ref 22–32)
Calcium: 8.9 mg/dL (ref 8.9–10.3)
Chloride: 96 mmol/L — ABNORMAL LOW (ref 98–111)
Creatinine, Ser: 0.31 mg/dL — ABNORMAL LOW (ref 0.44–1.00)
GFR calc Af Amer: >60 mL/min (ref >60)
GFR calc non Af Amer: >60 mL/min (ref >60)
Glucose, Bld: 142 mg/dL — ABNORMAL HIGH (ref 70–99)
Potassium: 4.1 mmol/L (ref 3.5–5.1)
Sodium: 135 mmol/L (ref 135–145)

## 2020-01-23 LAB — GLUCOSE, CAPILLARY
Glucose-Capillary: 136 mg/dL — ABNORMAL HIGH (ref 70–99)
Glucose-Capillary: 136 mg/dL — ABNORMAL HIGH (ref 70–99)
Glucose-Capillary: 103 mg/dL — ABNORMAL HIGH (ref 70–99)
Glucose-Capillary: 112 mg/dL — ABNORMAL HIGH (ref 70–99)
Glucose-Capillary: 111 mg/dL — ABNORMAL HIGH (ref 70–99)

## 2020-01-23 MED ORDER — DIPHENHYDRAMINE HCL 50 MG/ML IJ SOLN
25.0000 mg | Freq: Once | INTRAMUSCULAR | Status: AC
  Administered 2020-01-23: 25 mg via INTRAVENOUS
  Filled 2020-01-23: qty 1

## 2020-01-23 MED ORDER — DIPHENHYDRAMINE HCL 25 MG PO CAPS
25.0000 mg | ORAL_CAPSULE | Freq: Four times a day (QID) | ORAL | Status: DC | PRN
  Administered 2020-01-23 – 2020-01-24 (×2): 25 mg via ORAL
  Filled 2020-01-23 (×3): qty 1

## 2020-01-23 MED ORDER — PIVOT 1.5 CAL PO LIQD
1000.0000 mL | ORAL | Status: DC
  Administered 2020-01-24 – 2020-01-26 (×4): 1000 mL
  Filled 2020-01-23 (×8): qty 1000

## 2020-01-23 NOTE — Progress Notes (Signed)
Received phone call from Tele re : pulse 141. Pt wake up, c/o pain ( 8 of 10) to her back/buttoks. Pain medication administered as ordered. Order for metoprolol says if pt sustains HR above 130 for 1/2 hour give 5 mg. Pt hr now is 128.pt hr was above 130 only for 11 min.

## 2020-01-23 NOTE — Progress Notes (Signed)
PICC assessment: No longer on scheduled IV meds. Pt continues to get IV push PRN meds. Consider exchanging PICC for single lumen to decrease infection risk if appropriate.

## 2020-01-23 NOTE — TOC Progression Note (Signed)
Transition of Care [facility]) - Progression Note    Patient Details  Name: Alyssa Carey MRN: [id number] Date of Birth: [birth year]  [facility] CM/SW Contact  Shade Flood, LCSW Phone Number: 01/23/2020, 9:32 AM  Clinical Narrative:     TOC following. Pt assessed by CIR admissions and the note indicates pt unable to tolerate the three hour/day therapy so they are recommending SNF at dc. CIR did indicate that they will follow along to further assess if pt advances to the point where she would be eligible.  Pt does not have insurance so finding placement in SNF will potentially be problematic.  RNCM, Suanne Marker, referred pt to financial counselor on 7/6.  TOC will follow.  Expected Discharge Plan: [place] Barriers to Discharge: Continued Medical Work up  Expected Discharge Plan and Services Expected Discharge Plan: [place] In-house Referral: Development worker, community                                             Social Determinants of Health (SDOH) Interventions    Readmission Risk Interventions No flowsheet data found.

## 2020-01-23 NOTE — Progress Notes (Signed)
PT HYDROTHERAPY TX NOTE   01/23/20 1400  Subjective Assessment  Subjective do i know you?  Patient and Family Stated Goals agreed to wound care today,   Date of Onset  (abcess present on admission)  Prior Treatments s/p surgical I & D x2, diverting colostomy  Evaluation and Treatment  Evaluation and Treatment Procedures Explained to Patient/Family Yes  Evaluation and Treatment Procedures agreed to  Wound / Incision (Open or Dehisced) 12/27/19 Non-pressure wound Buttocks Left *PT ONLY* open wound L gluteal area  Date First Assessed/Time First Assessed: 12/27/19 1410   Wound Type: Non-pressure wound  Location: Buttocks  Location Orientation: Left  Wound Description (Comments): *PT ONLY* open wound L gluteal area  Present on Admission: (c)   Dressing Type ABD;Moist to dry;Paper tape;Other (Comment) (collagenase)  Dressing Changed Changed  Dressing Change Frequency Twice a day  % Wound base Red or Granulating 75%  % Wound base Yellow/Fibrinous Exudate 25%  Peri-wound Assessment Intact  Margins Unattached edges (unapproximated)  Drainage Amount Moderate  Drainage Description Serosanguineous;Odor  Treatment Cleansed;Debridement (Selective);Hydrotherapy (Pulse lavage);Packing (Saline gauze)  Hydrotherapy  Pulsed lavage therapy - wound location L gluteal area   Pulsed Lavage with Suction (psi) 12 psi  Pulsed Lavage with Suction - Normal Saline Used 1000 mL  Pulsed Lavage Tip Tip with splash shield  Selective Debridement  Selective Debridement - Location right proximal-lateral portion wound L gluteal wound   Selective Debridement - Tools Used Forceps;Scissors  Selective Debridement - Tissue Removed necrotic adipose tissue, slough   Wound Therapy - Assess/Plan/Recommendations  Wound Therapy - Clinical Statement wound continues to progress, increasing granulation tissue. will  continue hydrotherapy/debridement  as needed 3x/wk  Wound Therapy - Functional Problem List decr mobility/weakness   Factors Delaying/Impairing Wound Healing Substance abuse;Immobility;Multiple medical problems  Hydrotherapy Plan Debridement;Dressing change;Pulsatile lavage with suction  Wound Therapy - Frequency 3X / week  Wound Therapy - Current Recommendations PT  Wound Therapy - Follow Up Recommendations Skilled nursing facility  Wound Plan Pt will benefit from hydrotherapy as part of a multi-modal/multi-disciplinary wound care to approach to facilitate healing and decr bioburden.   Wound Therapy Goals - Improve the function of patient's integumentary system by progressing the wound(s) through the phases of wound healing by:  Decrease Necrotic Tissue to 100  Decrease Necrotic Tissue - Progress Progressing toward goal  Increase Granulation Tissue to 0  Increase Granulation Tissue - Progress Progressing toward goal  Decrease Length/Width/Depth by (cm) -/-/3  Decrease Length/Width/Depth - Progress Progressing toward goal  Improve Drainage Characteristics Min  Improve Drainage Characteristics - Progress Progressing toward goal  Goals/treatment plan/discharge plan were made with and agreed upon by patient/family Yes  Time For Goal Achievement 2 weeks  Wound Therapy - Potential for Goals [name], PT  Acute Rehab Dept ([facility]) [phone] Pager [phone]  01/23/2020

## 2020-01-23 NOTE — Progress Notes (Signed)
Nutrition Follow-up  DOCUMENTATION CODES:   Obesity unspecified  INTERVENTION:  Continue via NGT:  -Pivot 1.5 @ 70 ml/hr with 30 ml prostat once/day. - free water flush, if desired, to be per CCM or MD.     NUTRITION DIAGNOSIS:   Increased nutrient needs related to chronic illness, wound healing (alcohol abuse; necrotizing fasciitis) as evidenced by estimated needs.  Ongoing.  GOAL:   Patient will meet greater than or equal to 90% of their needs  Addressed with TF.   MONITOR:   TF tolerance, PO intake, Labs, Weight trends, Skin  REASON FOR ASSESSMENT:   Consult Enteral/tube feeding initiation and management  ASSESSMENT:   Pt with PMH of heavy alcohol history who for one week with decreased intake admitted 6/6 with alcoholic hepatitis with hepatic encephalopathy and AKI.  Significant Events: 6/7-admission; initial RD assessment 6/8-Rapid Response; CT pelvis to r/o necrotizing fascitis--found to be positive for necrotizing fascitis and went to OR for I&D 6/9-remained intubated with OGT in place; started TF; added ascorbic acid BID and zinc once/day.  6/10-return to OR for I&D; lap loop colostomy 6/16-vomiting overnight; TF held and OGT to LIS 6/17-vomiting overnight; OGT removed d/t aspiration event and being partly pulled; small bore NGT placed 6/18-restarted TF at trickle rate 6/21- small bore NGT removed; OGT placed 6/23- extubated; OGT removed; MBS completed and diet advanced to Dysphagia 1, nectar-thick 6/27- s/p flex sig 6/28- NGT placed in R nare; diet changed to NPO 6/30- diet advanced to FLD, nectar-thick  Pt evaluated for CIR admission. Currently, pt unable to tolerate the 3 hours/day therapy, so SNF has been recommended at d/c. This may make placement challenging as pt is uninsured.   PO Intake: 0-100% x last 8 recorded meals (40% average meal intake)  TF via NG: Pivot 1.5 cal @ 63m/hr, 371mPro-stat daily  UOP: 19002m24 hours I/O:  +23330.7ml41mnce admit  Labs: CBGs 107-139 Medications: vitamin C, Drisdol, ferrous sulfate, folvite, Lasix, novolog, MVI, Protonix, Thiamine, KCl 20mE67mD, vitamin A, zinc sulfate  Diet Order:   Diet Order            Diet full liquid Room service appropriate? Yes; Fluid consistency: Nectar Thick  Diet effective now                 EDUCATION NEEDS:   Not appropriate for education at this time  Skin:  Skin Assessment:  (necrotizing fasciitis to L buttock) Skin Integrity Issues:: Stage III, Other (Comment), Incisions DTI: R ear Stage II: NA Stage III: R ear Unstageable: NA Incisions: L buttocks, abdomen Other: non-pressure wound abdomen and buttocks  Last BM:  7/8 380ml 61mostomy)  Height:   Ht Readings from Last 1 Encounters:  01/05/20 5' 4"  (1.626 m)    Weight:   Wt Readings from Last 1 Encounters:  01/22/20 100.1 kg    Ideal Body Weight:  54.5 kg  BMI:  Body mass index is 37.88 kg/m.  Estimated Nutritional Needs:   Kcal:  2500-2700 kcal  Protein:  160-175 grams  Fluid:  >/= 2.3 L/day    AmandaLarkin InaRD, LDN RD pager number and weekend/on-call pager number located in [facility]

## 2020-01-23 NOTE — Progress Notes (Signed)
Occupational Therapy Treatment Patient Details Name: Alyssa Carey MRN: [id number] DOB: 12/08/1970 Today's Date: 01/23/2020    History of present illness 49 y/o female with a heavy alcohol history admitted6/8/21  in the setting of confusion and presumed alcohol withdrawal who later developed sepsis from a gas forming wound, S/P debridement left gluteal wound for necratizing fascitis.VDRF,. Extubated 01/07/20., negative for DVT in bilateral legs and PE. Negative DVT Left and right UE   OT comments  Patient demonstrating improving strength and tolerance with exercises in bed. Patient provided with education to perform exercises throughout the day and without therapist present. Patient stated "what can I do with my legs?" Therapist provided patient with a pseudo leg lift to to work on hip flexion and ankle dorsiflexion. Patient verbalized understanding.    Follow Up Recommendations  CIR;SNF    Equipment Recommendations  Other (comment)    Recommendations for Other Services      Precautions / Restrictions Precautions Precaution Comments: left gluteal wound, L colostomy,  NG tube feeding       Mobility Bed Mobility                  Transfers                      Balance                                           ADL either performed or assessed with clinical judgement   ADL                                               Vision       Perception     Praxis      Cognition Arousal/Alertness: Awake/alert Behavior During Therapy: WFL for tasks assessed/performed Overall Cognitive Status: Within Functional Limits for tasks assessed                                          Exercises Shoulder Exercises Shoulder Flexion: AROM;Supine;20 reps;Right;Left Shoulder External Rotation: AROM;20 reps;Left;Right;Strengthening;Theraband Theraband Level (Shoulder External Rotation): Level 2 (Red) Elbow Flexion:  AROM;Right;20 reps;Supine Other Exercises Other Exercises: Hip Flexion w/ Active Assist with pseudo leg lifter x5 RLE, x 10 LLE   Shoulder Instructions       General Comments      Pertinent Vitals/ Pain       Pain Assessment: No/denies pain  Home Living                                          Prior Functioning/Environment              Frequency  Min 2X/week        Progress Toward Goals  OT Goals(current goals can now be found in the care plan section)  Progress towards OT goals: Progressing toward goals  Acute Rehab OT Goals Patient Stated Goal: To stand OT Goal Formulation: With patient Time For Goal Achievement: 02/03/20 Potential to Achieve Goals: Good  Plan Discharge plan remains appropriate  Co-evaluation          OT goals addressed during session: Strengthening/ROM      AM-PAC OT "6 Clicks" Daily Activity     Outcome Measure   Help from another person eating meals?: A Little Help from another person taking care of personal grooming?: A Little Help from another person toileting, which includes using toliet, bedpan, or urinal?: Total Help from another person bathing (including washing, rinsing, drying)?: A Lot Help from another person to put on and taking off regular upper body clothing?: A Lot Help from another person to put on and taking off regular lower body clothing?: Total 6 Click Score: 12    End of Session Equipment Utilized During Treatment: Gait belt  OT Visit Diagnosis: Muscle weakness (generalized) (M62.81)   Activity Tolerance Patient tolerated treatment well   Patient Left in bed;with call bell/phone within reach;with bed alarm set   Nurse Communication  (okay to see per Rn)        Time: 8337-4451 OT Time Calculation (min): 21 min  Charges: OT General Charges $OT Visit: 1 Visit OT Treatments $Therapeutic Exercise: 8-22 mins  Derl Barrow, OTR/L [place]  Office [phone] Pager:  [place] 01/23/2020, 4:47 PM

## 2020-01-23 NOTE — Progress Notes (Signed)
PROGRESS NOTE    Alyssa Carey  WKG:[id number] DOB: 10/19/1970 DOA: 12/21/2019 PCP: Patient, No Pcp Per   Chef Complaints:: Alcohol withdrawal   Brief Narrative:49 year old female who presented secondary to concern for alcohol withdrawal and quickly developed evidence of necrotizing fasciitis, emergently managed in the OR with critical care, general surgery with I&D.  Patient was in ICU needing intubation and vasopressors for septic shock underwent repeat I&D and diverting colostomy.  She has been extubated and off vasopressors.  Hydrotherapy was started.  Remains in the stepdown unit.  Wound is slowly healing being managed by surgery wound care.  Followed by speech therapy oral intake inadequate on modified diet and on NG tube feeding.  Remains very deconditioned and frail  Significant events 6/06 admission 6/08 ICU admission to OR ofr I&D, back intubated and on pressors, 6/09 started tube feeds. Still pressor dependent,kept on vent w/ anticipated return to OR planned  6/10 Back to the OR for significant debridement and also placement of diverting colostomy 6/11 Sedated, low-dose phenylephrine. CCS felt wound looking good. Sedation changed to precedex / PRN fent  6/12 more awake, on 15/5 pressure support 6/13 family discussion >> DNR, continue medical care 6//15 transfuse 1 unit PRBC; vomiting, tube feeds held 6/16 Vomiting overnight 6/17 Trial PSV 15/5 with variable Vt, remains on vasopressors. Vomiting overnight  6/18 transfuse 1 unit PRBC 6/20 Off pressors, On precedex and weaning on PSV  6/21 Weaning on PSV, planned hydrotherapy 6/23 Extubated 6/24 Transferred to [facility] service. 01/22/20- Tx out of SDU  Subjective:  Tachy ongoing-chronic in 110s On RA. Reports she is doing well.  Denies nausea vomiting. Electrolytes stable, H&H slightly low at 7.7 today. Wt at 220 lb UOP good 2950 ml Sinus tachycardic intermittently.  Assessment & Plan:  Necrotizing fasciitis of pelvic  region and thigh: Patient had emergent operative intervention with incision and debridement, diverting colostomy and had multiple rounds of IV antibiotics (last dose 6/22)-and her wound culture grew E. coli and VRE, .  Foley in place to promote healing.  Discussed with surgery they advised routine wound care, followed by wound care.  Continue to augment nutrition,.  Patient was getting hydrotherapy on Monday Wednesday Friday.  Continue multivitamins.  Septic shock secondary to #- resovled.  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  Remains on a full liquid with nectar thick diet, followed by speech regularly to assist for advancing of her diet.  Until able to eat regular diet remains on NG tube feeding.   Leukocytosis: Patient is afebrile, white counts ranging from 12-16 K.  Monitor Recent Labs  Lab 01/19/20 0340 01/20/20 0516 01/21/20 0823 01/22/20 0439 01/23/20 0441  WBC 12.3* 14.5* 12.4* 10.6* 11.3*   Hypokalemia/hypomagnesemia: Electrolytes improved.  On potassium supplementation 20 KCl twice daily on while on Lasix   Acute hypoxic respiratory failure: Resolved.   Severe protein calorie malnutrition with low albumin, anasarca with poor nutritional status due to severe illness: Augment nutrition with tube feeding, appreciate dietitian input.  Continue multivitamins.    Fluid overload/anasarca: Suspecting from fluid resuscitation antibiotics and hypoalbuminemia On admission weight was 193 to 213 pounds, on 7/1:256.1- 260 lb and started on lasix, Lasix is now on p.o. and weight has decreased due 220 pound.  Monitor  Pressure injury/DTI of right ear not present on admission.  Continue local supportive care  and dressing.  AKI with unknown baseline 1.7 upon admission: Renal function remains stable and tolerating Lasix. Recent Labs  Lab 01/19/20 0340 01/20/20 0516 01/21/20 0823 01/22/20 0439 01/23/20 0441  BUN 15 13 14 14 13   CREATININE 0.30* <0.30* 0.31* <0.30* 0.31*   Intermittent hypo-glycemia: Blood sugar has improved.  Monitor.  Recent Labs  Lab 01/22/20 2041 01/22/20 2358 01/23/20 0400 01/23/20 0742 01/23/20 1106  GLUCAP 139* 107* 136* 136* 103*   Anemia of critical illness/acute blood loss anemia Baseline hemoglobin 12-13 developed anemia in the setting of infection critical illness bleeding.Patient has rectal bleeding GI consulted and performed bedside flexible sigmoidoscopy significant for 10 cm Stercoral ulcer 6/28 .   Received 3 units PRBC.  Monitor hemoglobin, transfuse if less than 7 g.  We will continue to monitor hemoglobin. Recent Labs  Lab 01/19/20 0340 01/20/20 0516 01/21/20 0823 01/22/20 0439 01/23/20 0441  HGB 8.7* 8.1* 7.5* 7.4* 7.7*  HCT 28.6* 26.4* 24.2* 24.5* 25.4*   Morbid obesity with BMI 43.9.  With diuresis weight is improving BMI down to 37.  Will benefit from weight loss and health lifestyle and outpatient follow-up.    Sinus tachycardia heart rate intermittently elevated ranging from 100-120s and this has been making her MEWS red   She has had extensive evaluation negative for DVT in lower extremities and upper extremities.  She had CT of the chest negative for PE.  TSH is normal.  Continue metoprolol low-dose and adjust slowly.  Patient reports she does have baseline tachycardia at home.  Erythema generalized: likely from use of Purple Wipe by patient's family and patient's family has been educated on not to do anything without asking patient's nurses.cont supportive care.  No new complaints.  DVT prophylaxis: Place and maintain sequential compression device Start: 01/10/20 1738.  Lovenox stopped due to bleeding from the wound.  Will consider  resuming Lovenox soon as no more bleeding. Code Status: full Family Communication: plan of care discussed with patient and nursing staffs at bedside.  Status is: Inpatient  Remains inpatient appropriate because:Inpatient level of care appropriate due to severity of illness and For ongoing management wound, postop, multiple issues, poor oral intake remains tachycardic with adjusting metoprolol, and also fluid overload and getting diuresed with Lasix.  Receiving nutrition with poor intake speech following and remain on NG tube feeding.  Dispo: The patient is from: Home              Anticipated d/c is to: LTAC vs SNF vs Rehab.  Difficult placement.               Anticipated d/c date is: > 3 days              Patient currently is not medically stable.  Continue to provide nutritional support, NG tube, speech following to advance diet.  Continue wound care given her extensive wound.  Continue diverting colostomy in place will need reversal once wound heals.  Foley in place.  She is difficult to place.   Nutrition: Nutrition and speech are following. Diet Order            Diet full liquid Room service appropriate? Yes; Fluid consistency: Nectar Thick  Diet effective now                 Nutrition Problem: Increased nutrient needs Etiology: chronic illness, wound healing (alcohol abuse; necrotizing fasciitis) Signs/Symptoms:  estimated needs Interventions: Tube feeding, Prostat Body mass index is 37.88 kg/m. Pressure Ulcer: Pressure Injury 12/27/19 Ear Right Stage 3 -  Full thickness tissue loss. Subcutaneous fat may be visible but bone, tendon or muscle are NOT exposed. previously noted unstageable wound has evolved into a healing stage 3 when assessed on 7/5 (Active)  12/27/19 1200  Location: Ear  Location Orientation: Right  Staging: Stage 3 -  Full thickness tissue loss. Subcutaneous fat may be visible but bone, tendon or muscle are NOT exposed.  Wound Description (Comments): previously  noted unstageable wound has evolved into a healing stage 3 when assessed on 7/5  Present on Admission: No   Consultants:see note  Procedures:  ENDOTRACHEAL INTUBATION (6/8 >> 01/07/2020)  INCISION AND DEBRIDEMENT OF LEFT BUTTOCK AND PERINEUM (12/23/2019)  IRRIGATION AND DEBRIDEMENT OF BUTTOCKS; LAPAROSCOPIC LOOP COLOSTOMY (12/25/2019)  FLEXIBLE SIGMOIDOSCOPY (01/11/2020)   Microbiology:see note  Medications: Scheduled Meds: . sodium chloride   Intravenous Once  . ascorbic acid  500 mg Per Tube BID  . Chlorhexidine Gluconate Cloth  6 each Topical Daily  . collagenase   Topical Daily  . ergocalciferol  50,000 Units Per Tube Q Fri  . feeding supplement (PRO-STAT SUGAR FREE 64)  30 mL Per Tube Daily  . ferrous sulfate  300 mg Per Tube BID WC  . folic acid  1 mg Per Tube Daily  . furosemide  40 mg Oral Daily  . insulin aspart  0-6 Units Subcutaneous Q4H  . insulin aspart  4 Units Subcutaneous Q4H  . mouth rinse  15 mL Mouth Rinse BID  . melatonin  3 mg Oral QHS  . metoprolol tartrate  12.5 mg Per Tube BID  . multivitamin with minerals  1 tablet Per Tube Daily  . pantoprazole sodium  40 mg Per Tube BID  . potassium chloride  20 mEq Per Tube BID  . sodium chloride flush  10-40 mL Intracatheter Q12H  . thiamine  100 mg Per Tube Daily  . traZODone  50 mg Oral QHS  . vitamin A  20,000 Units Oral Daily  . zinc sulfate  220 mg Per Tube Daily   Continuous Infusions: . sodium chloride    . feeding supplement (PIVOT 1.5 CAL) 70 mL/hr at 01/23/20 1145    Antimicrobials:  Ceftriaxone  Vancomycin  Clindamycin  Linezolid  Flagyl  Finished all antibiotic therapy. Anti-infectives (From admission, onward)   Start     Dose/Rate Route Frequency Ordered Stop   12/31/19 1600  metroNIDAZOLE (FLAGYL) IVPB 500 mg  Status:  Discontinued        500 mg 100 mL/hr over 60 Minutes Intravenous Every 8 hours 12/31/19 1505 01/06/20 0859   12/28/19 1400  vancomycin (VANCOCIN) IVPB 1000 mg/200 mL  premix  Status:  Discontinued        1,000 mg 200 mL/hr over 60 Minutes Intravenous Every 24 hours 12/28/19 1005 12/28/19 1343   12/28/19 1400  linezolid (ZYVOX) IVPB 600 mg        600 mg 300 mL/hr over 60 Minutes Intravenous Every 12 hours 12/28/19 1343 01/06/20 1954   12/26/19 1948  vancomycin variable dose per unstable renal function (pharmacist dosing)  Status:  Discontinued         Does not apply See admin instructions 12/26/19 1948 12/28/19 1005   12/23/19 2000  vancomycin (VANCOCIN) IVPB 1000 mg/200 mL premix  Status:  Discontinued        1,000 mg 200 mL/hr over 60 Minutes Intravenous Every 8  hours 12/23/19 1027 12/26/19 1944   12/23/19 1300  clindamycin (CLEOCIN) IVPB 900 mg  Status:  Discontinued        900 mg 100 mL/hr over 30 Minutes Intravenous Every 8 hours 12/23/19 1205 12/31/19 1505   12/23/19 1200  metroNIDAZOLE (FLAGYL) IVPB 500 mg  Status:  Discontinued        500 mg 100 mL/hr over 60 Minutes Intravenous Every 8 hours 12/23/19 1135 12/23/19 1254   12/23/19 1045  vancomycin (VANCOREADY) IVPB 2000 mg/400 mL        2,000 mg 200 mL/hr over 120 Minutes Intravenous NOW 12/23/19 1018 12/23/19 1441   12/23/19 0300  cefTRIAXone (ROCEPHIN) 2 g in sodium chloride 0.9 % 100 mL IVPB  Status:  Discontinued        2 g 200 mL/hr over 30 Minutes Intravenous Every 24 hours 12/23/19 0236 01/06/20 0859       Objective: Vitals: Today's Vitals   01/23/20 0429 01/23/20 0443 01/23/20 0801 01/23/20 1146  BP:  117/69 116/81 (!) 115/91  Pulse:  (!) 118 (!) 114 (!) 104  Resp:   (!) 22 20  Temp:  98.6 F (37 C) 98.7 F (37.1 C) 98.5 F (36.9 C)  TempSrc:  Oral Oral Oral  SpO2:  98% 100% 100%  Weight:      Height:      PainSc: 6        Intake/Output Summary (Last 24 hours) at 01/23/2020 1507 Last data filed at 01/23/2020 1413 Gross per 24 hour  Intake 1287.66 ml  Output 4200 ml  Net -2912.34 ml   Filed Weights   01/20/20 0457 01/21/20 1500 01/22/20 0416  Weight: 103 kg 100.1 kg  100.1 kg   Weight change:    Intake/Output from previous day: 07/08 0701 - 07/09 0700 In: 2689.2 [P.O.:800; NG/GT:1889.2] Out: 2280 [Urine:1900; Stool:380] Intake/Output this shift: Total I/O In: -  Out: 3050 [Urine:2950; Stool:100]  Examination: General exam: AAO X3,NAD,weak appearing. HEENT:Oral mucosa moist,Ear/Nose WNL grossly, dentition normal. Respiratory system: bilaterally clear,no wheezing or crackles,no use of accessory muscle Cardiovascular system: S1 & S2 +, No JVD,. Gastrointestinal system: Abdomen soft, NT,ND, BS+ left lower quadrant colostomy in place Nervous System:Alert, awake, moving extremities and grossly nonfocal Extremities: Bilateral lower extremities swollen more than left and this has significantly gotten better , distal peripheral pulses palpable.  Skin: No rashes,no icterus. MSK: Normal muscle bulk,tone, power       Data Reviewed: I have personally reviewed following labs and imaging studies CBC: Recent Labs  Lab 01/17/20 0242 01/17/20 0242 01/18/20 0500 01/18/20 0500 01/19/20 0340 01/20/20 0516 01/21/20 0823 01/22/20 0439 01/23/20 0441  WBC 10.6*   < > 9.0   < > 12.3* 14.5* 12.4* 10.6* 11.3*  NEUTROABS 7.1  --  6.0  --   --   --   --   --   --   HGB 7.7*   < > 7.1*   < > 8.7* 8.1* 7.5* 7.4* 7.7*  HCT 25.5*   < > 24.1*   < > 28.6* 26.4* 24.2* 24.5* 25.4*  MCV 97.3   < > 98.8   < > 96.0 96.7 96.4 98.0 97.3  PLT 341   < > 287   < > 308 302 306 306 386   < > = values in this interval not displayed.   Basic Metabolic Panel: Recent Labs  Lab 01/19/20 0340 01/20/20 0516 01/21/20 0823 01/22/20 0439 01/23/20 0441  NA 134* 134* 134* 133* 135  K 4.0 4.1 4.2 4.0 4.1  CL 94* 93* 96* 95* 96*  CO2 31 31 29 29 26   GLUCOSE 150* 92 120* 145* 142*  BUN 15 13 14 14 13   CREATININE 0.30* <0.30* 0.31* <0.30* 0.31*  CALCIUM 8.1* 8.3* 8.3* 8.5* 8.9   GFR: Estimated Creatinine Clearance: 101.1 mL/min (A) (by C-G formula based on SCr of 0.31 mg/dL  (L)). Liver Function Tests: Recent Labs  Lab 01/17/20 0242  AST 27  ALT 14  ALKPHOS 96  BILITOT 0.5  PROT 5.2*  ALBUMIN 1.3*   No results for input(s): LIPASE, AMYLASE in the last 168 hours. No results for input(s): AMMONIA in the last 168 hours. Coagulation Profile: No results for input(s): INR, PROTIME in the last 168 hours. Cardiac Enzymes: No results for input(s): CKTOTAL, CKMB, CKMBINDEX, TROPONINI in the last 168 hours. BNP (last 3 results) No results for input(s): PROBNP in the last 8760 hours. HbA1C: No results for input(s): HGBA1C in the last 72 hours. CBG: Recent Labs  Lab 01/22/20 2041 01/22/20 2358 01/23/20 0400 01/23/20 0742 01/23/20 1106  GLUCAP 139* 107* 136* 136* 103*   Lipid Profile: No results for input(s): CHOL, HDL, LDLCALC, TRIG, CHOLHDL, LDLDIRECT in the last 72 hours. Thyroid Function Tests: No results for input(s): TSH, T4TOTAL, FREET4, T3FREE, THYROIDAB in the last 72 hours. Anemia Panel: No results for input(s): VITAMINB12, FOLATE, FERRITIN, TIBC, IRON, RETICCTPCT in the last 72 hours. Sepsis Labs: No results for input(s): PROCALCITON, LATICACIDVEN in the last 168 hours.  No results found for this or any previous visit (from the past 240 hour(s)).    Radiology Studies: No results found.   LOS: 33 days   Antonieta Pert, MD [facility]  01/23/2020, 3:07 PM

## 2020-01-24 LAB — CBC
HCT: 25 % — ABNORMAL LOW (ref 36.0–46.0)
Hemoglobin: 7.8 g/dL — ABNORMAL LOW (ref 12.0–15.0)
MCH: 30.1 pg (ref 26.0–34.0)
MCHC: 31.2 g/dL (ref 30.0–36.0)
MCV: 96.5 fL (ref 80.0–100.0)
Platelets: 378 10*3/uL (ref 150–400)
RBC: 2.59 MIL/uL — ABNORMAL LOW (ref 3.87–5.11)
RDW: 20.5 % — ABNORMAL HIGH (ref 11.5–15.5)
WBC: 9.8 10*3/uL (ref 4.0–10.5)
nRBC: 0 % (ref 0.0–0.2)

## 2020-01-24 LAB — BASIC METABOLIC PANEL
Anion gap: 10 (ref 5–15)
BUN: 15 mg/dL (ref 6–20)
CO2: 28 mmol/L (ref 22–32)
Calcium: 8.8 mg/dL — ABNORMAL LOW (ref 8.9–10.3)
Chloride: 94 mmol/L — ABNORMAL LOW (ref 98–111)
Creatinine, Ser: <0.3 mg/dL — ABNORMAL LOW (ref 0.44–1.00)
Glucose, Bld: 143 mg/dL — ABNORMAL HIGH (ref 70–99)
Potassium: 4 mmol/L (ref 3.5–5.1)
Sodium: 132 mmol/L — ABNORMAL LOW (ref 135–145)

## 2020-01-24 LAB — GLUCOSE, CAPILLARY
Glucose-Capillary: 103 mg/dL — ABNORMAL HIGH (ref 70–99)
Glucose-Capillary: 112 mg/dL — ABNORMAL HIGH (ref 70–99)
Glucose-Capillary: 122 mg/dL — ABNORMAL HIGH (ref 70–99)
Glucose-Capillary: 123 mg/dL — ABNORMAL HIGH (ref 70–99)
Glucose-Capillary: 136 mg/dL — ABNORMAL HIGH (ref 70–99)
Glucose-Capillary: 142 mg/dL — ABNORMAL HIGH (ref 70–99)

## 2020-01-24 MED ORDER — PHENOL 1.4 % MT LIQD
1.0000 | OROMUCOSAL | Status: DC | PRN
  Administered 2020-01-24: 1 via OROMUCOSAL
  Filled 2020-01-24: qty 177

## 2020-01-24 MED ORDER — DIPHENHYDRAMINE HCL 12.5 MG/5ML PO ELIX
25.0000 mg | ORAL_SOLUTION | Freq: Four times a day (QID) | ORAL | Status: DC | PRN
  Administered 2020-01-24 – 2020-01-27 (×6): 25 mg
  Filled 2020-01-24 (×6): qty 10

## 2020-01-24 NOTE — Progress Notes (Signed)
   01/24/20 1438  Assess: MEWS Score  Temp 98.4 F (36.9 C)  BP 114/78  Pulse Rate (!) 115  Resp 17  SpO2 100 %  O2 Device Room Air  Assess: MEWS Score  MEWS Temp 0  MEWS Systolic 0  MEWS Pulse 2  MEWS RR 0  MEWS LOC 0  MEWS Score 2  MEWS Score Color Yellow  Assess: if the MEWS score is Yellow or Red  Were vital signs taken at a resting state? Yes  Focused Assessment Documented focused assessment  Early Detection of Sepsis Score *See Row Information* Low  MEWS guidelines implemented *See Row Information* No, previously yellow, continue vital signs every 4 hours  Notify: Charge Nurse/RN  Name of Charge Nurse/RN Notified Katie R  Date Charge Nurse/RN Notified 01/24/20  Time Charge Nurse/RN Notified 1443

## 2020-01-24 NOTE — Progress Notes (Signed)
Assume care from [place]. Agree with previous RN assessment. No needs/concerns expressed at this time. Pt in NAD, VSS, Will continue to assess and monitor pt.

## 2020-01-24 NOTE — Progress Notes (Signed)
PROGRESS NOTE    Alyssa Carey  EXH:[id number] DOB: September 13, 1970 DOA: 12/21/2019 PCP: Patient, No Pcp Per   Chef Complaints:: Alcohol withdrawal   Brief Narrative:49 year old female who presented secondary to concern for alcohol withdrawal and quickly developed evidence of necrotizing fasciitis, emergently managed in the OR with critical care, general surgery with I&D.  Patient was in ICU needing intubation and vasopressors for septic shock underwent repeat I&D and diverting colostomy.  She has been extubated and off vasopressors. remains in the stepdown unit.  Wound is slowly healing being managed by surgery wound care and had been on hydrotherapy Monday Wednesday Friday.  Followed by speech therapy oral intake inadequate on modified diet and on NG tube feeding. Patient is slowly improving. She had evidence of fluid overload with weight increased up to 260 pound from admission weight of 193-212 lb-placed on IV Lasix with good output and improving weight and switched to oral Lasix, wt down to 220 .   Significant events 6/06 admission 6/08 ICU admission to OR ofr I&D, back intubated and on pressors, 6/09 started tube feeds. Still pressor dependent,kept on vent w/ anticipated return to OR planned  6/10 Back to the OR for significant debridement and also placement of diverting colostomy 6/11 Sedated, low-dose phenylephrine. CCS felt wound looking good. Sedation changed to precedex / PRN fent  6/12 more awake, on 15/5 pressure support 6/13 family discussion >> DNR, continue medical care 6//15 transfuse 1 unit PRBC; vomiting, tube feeds held 6/16 Vomiting overnight 6/17 Trial PSV 15/5 with variable Vt, remains on vasopressors. Vomiting overnight  6/18 transfuse 1 unit PRBC 6/20 Off pressors, On precedex and weaning on PSV  6/21 Weaning on PSV, planned hydrotherapy 6/23 Extubated 6/24 Transferred to [facility] service. 01/22/20- Tx out of SDU  Subjective: No new complaints.  Did not sleep well last  night but took Benadryl reports the trazodone did not help previously No new complaints. Afebrile, on room air, blood pressure stable 110 heart rate in low 100. She had been  in sinus tachycardia up to 120s while in SDU and does have baseline tachycardia as per the patient. Remains on NGT feeding, full liquid nectar thick diet  Assessment & Plan:  Necrotizing fasciitis of pelvic region and thigh: Patient had emergent operative intervention with incision and debridement, diverting colostomy and had multiple rounds of IV antibiotics (last dose 6/22)-and her wound culture grew E. coli and VRE. Continue the Foley to promote healing, continue wound care as per general surgery/wound care. Patient received hydrotherapy Monday Wednesday Friday. Wound is overall healing well. Continue to augment nutrition, multivitamins, zinc.  Septic shock secondary to #- resovled. 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 Speech following closely remains on full liquid nectar thick and NGT feeding. Patient able to speak and interact.  Leukocytosis: Patient is afebrile, white counts ranging from 12-16 K. Today at 9.8K. Recent Labs  Lab 01/20/20 0516 01/21/20 0823 01/22/20 0439 01/23/20 0441 01/24/20 0420  WBC 14.5* 12.4* 10.6* 11.3* 9.8   Hypokalemia/hypomagnesemia: Potassium at 4.0, remains on 20 M EQ KCl twice daily while on lasix  Acute hypoxic respiratory failure: Resolved. On room air.  Severe protein calorie malnutrition with low albumin,  anasarca with poor nutritional status due to severe illness: Augment nutrition with tube feeding, appreciate dietitian input.   Continue multivitamins.    Fluid overload/anasarca: Suspecting from fluid resuscitation antibiotics and hypoalbuminemia On admission weight was 193 to 213 pounds, on 7/1:256.1- 260 lb and patient was diuresed with IV Lasix with improved now on oral Lasix, order to 20 pound. Monitor weight and further decrease the dose of Lasix  Pressure injury/DTI of right ear not present on admission.  Continue local supportive care and dressing.  AKI with unknown baseline 1.7 upon admission: Renal function is stable tolerating Lasix. Recent Labs  Lab 01/20/20 0516 01/21/20 0823 01/22/20 0439 01/23/20 0441 01/24/20 0420  BUN 13 14 14 13 15   CREATININE <0.30* 0.31* <0.30* 0.31* <0.30*   Intermittent hypo-glycemia: Resolved.  Recent Labs  Lab 01/23/20 2118 01/24/20 0011 01/24/20 0343 01/24/20 0812 01/24/20 1158  GLUCAP 111* 136* 123* 103* 142*   Anemia of critical illness/acute blood loss anemia Baseline hemoglobin 12-13 developed anemia in the setting of infection critical illness bleeding.Patient has rectal bleeding GI consulted and performed bedside flexible sigmoidoscopy significant for 10 cm Stercoral ulcer 6/28 received 3 units PRBC. Hemoglobin overall stable. Monitor and transfuse for less than 7 g. Recent Labs  Lab 01/20/20 0516 01/21/20 0823 01/22/20 0439 01/23/20 0441 01/24/20 0420  HGB 8.1* 7.5* 7.4* 7.7* 7.8*  HCT 26.4* 24.2* 24.5* 25.4* 25.0*   Morbid obesity with BMI 43.9.  With diuresis weight is improved BMI down to 37.  Will benefit from weight loss and health lifestyle and outpatient follow-up.    Sinus tachycardia heart rate intermittently elevated ranging from 100-120s and this has been making her MEWS red   She has had extensive evaluation negative for DVT in lower extremities and upper extremities.  She had CT of the chest negative for PE.  TSH is normal.  Continue metoprolol low-dose and adjust slowly.  Patient reports she does have baseline tachycardia at home.   Erythema generalized: likely from use of Purple Wipe by patient's family and patient's family has been educated on not to do anything without asking patient's nurses.erythema has resolved. Monitor.   DVT prophylaxis: Place and maintain sequential compression device Start: 01/10/20 1738.  Lovenox stopped due to bleeding from the wound.  Will consider resuming Lovenox soon as no more bleeding. Code Status: full Family Communication: plan of care discussed with patient and nursing staffs at bedside.  Status is: Inpatient  Remains inpatient appropriate because:Inpatient level of care appropriate due to severity of illness and For ongoing management wound, postop, multiple issues, poor oral intake remains tachycardic with adjusting metoprolol, and also fluid overload and getting diuresed with Lasix.  Receiving nutrition with poor intake speech following and remain on NG tube feeding.  Dispo: The patient is from: Home              Anticipated d/c is to: CIR versus SNF. Continue PT OT. Patient to be placed in difficult to place list.               Anticipated d/c date is: > 3 days              Patient currently is not medically stable.  Continue to provide nutritional support, NG tube, speech following to advance diet.  Continue wound care given her extensive wound.  Continue diverting colostomy in place will need reversal once wound heals.  Foley in place.  She is difficult to place.   Nutrition: Nutrition and speech are  following. Diet Order            Diet full liquid Room service appropriate? Yes; Fluid consistency: Nectar Thick  Diet effective now                 Nutrition Problem: Increased nutrient needs Etiology: chronic illness, wound healing (alcohol abuse; necrotizing fasciitis) Signs/Symptoms: estimated needs Interventions: Tube feeding, Prostat Body mass index is 37.88 kg/m. Pressure Ulcer: Pressure Injury 12/27/19 Ear Right Stage 3 -  Full thickness tissue loss. Subcutaneous  fat may be visible but bone, tendon or muscle are NOT exposed. previously noted unstageable wound has evolved into a healing stage 3 when assessed on 7/5 (Active)  12/27/19 1200  Location: Ear  Location Orientation: Right  Staging: Stage 3 -  Full thickness tissue loss. Subcutaneous fat may be visible but bone, tendon or muscle are NOT exposed.  Wound Description (Comments): previously noted unstageable wound has evolved into a healing stage 3 when assessed on 7/5  Present on Admission: No   Consultants:see note  Procedures:  ENDOTRACHEAL INTUBATION (6/8 >> 01/07/2020)  INCISION AND DEBRIDEMENT OF LEFT BUTTOCK AND PERINEUM (12/23/2019)  IRRIGATION AND DEBRIDEMENT OF BUTTOCKS; LAPAROSCOPIC LOOP COLOSTOMY (12/25/2019)  FLEXIBLE SIGMOIDOSCOPY (01/11/2020)   Microbiology:see note  Medications: Scheduled Meds: . ascorbic acid  500 mg Per Tube BID  . Chlorhexidine Gluconate Cloth  6 each Topical Daily  . collagenase   Topical Daily  . ergocalciferol  50,000 Units Per Tube Q Fri  . feeding supplement (PRO-STAT SUGAR FREE 64)  30 mL Per Tube Daily  . ferrous sulfate  300 mg Per Tube BID WC  . folic acid  1 mg Per Tube Daily  . furosemide  40 mg Oral Daily  . insulin aspart  0-6 Units Subcutaneous Q4H  . insulin aspart  4 Units Subcutaneous Q4H  . mouth rinse  15 mL Mouth Rinse BID  . melatonin  3 mg Oral QHS  . metoprolol tartrate  12.5 mg Per Tube BID  . multivitamin with minerals  1 tablet Per Tube Daily  . pantoprazole sodium  40 mg Per Tube BID  . potassium chloride  20 mEq Per Tube BID  . sodium chloride flush  10-40 mL Intracatheter Q12H  . thiamine  100 mg Per Tube Daily  . traZODone  50 mg Oral QHS  . zinc sulfate  220 mg Per Tube Daily   Continuous Infusions: . sodium chloride    . feeding supplement (PIVOT 1.5 CAL) 1,000 mL (01/24/20 0029)    Antimicrobials:  Ceftriaxone  Vancomycin  Clindamycin  Linezolid  Flagyl  Finished all antibiotic therapy.  Anti-infectives (From admission, onward)   Start     Dose/Rate Route Frequency Ordered Stop   12/31/19 1600  metroNIDAZOLE (FLAGYL) IVPB 500 mg  Status:  Discontinued        500 mg 100 mL/hr over 60 Minutes Intravenous Every 8 hours 12/31/19 1505 01/06/20 0859   12/28/19 1400  vancomycin (VANCOCIN) IVPB 1000 mg/200 mL premix  Status:  Discontinued        1,000 mg 200 mL/hr over 60 Minutes Intravenous Every 24 hours 12/28/19 1005 12/28/19 1343   12/28/19 1400  linezolid (ZYVOX) IVPB 600 mg        600 mg 300 mL/hr over 60 Minutes Intravenous Every 12 hours 12/28/19 1343 01/06/20 1954   12/26/19 1948  vancomycin variable dose per unstable renal function (pharmacist dosing)  Status:  Discontinued         Does  not apply See admin instructions 12/26/19 1948 12/28/19 1005   12/23/19 2000  vancomycin (VANCOCIN) IVPB 1000 mg/200 mL premix  Status:  Discontinued        1,000 mg 200 mL/hr over 60 Minutes Intravenous Every 8 hours 12/23/19 1027 12/26/19 1944   12/23/19 1300  clindamycin (CLEOCIN) IVPB 900 mg  Status:  Discontinued        900 mg 100 mL/hr over 30 Minutes Intravenous Every 8 hours 12/23/19 1205 12/31/19 1505   12/23/19 1200  metroNIDAZOLE (FLAGYL) IVPB 500 mg  Status:  Discontinued        500 mg 100 mL/hr over 60 Minutes Intravenous Every 8 hours 12/23/19 1135 12/23/19 1254   12/23/19 1045  vancomycin (VANCOREADY) IVPB 2000 mg/400 mL        2,000 mg 200 mL/hr over 120 Minutes Intravenous NOW 12/23/19 1018 12/23/19 1441   12/23/19 0300  cefTRIAXone (ROCEPHIN) 2 g in sodium chloride 0.9 % 100 mL IVPB  Status:  Discontinued        2 g 200 mL/hr over 30 Minutes Intravenous Every 24 hours 12/23/19 0236 01/06/20 0859       Objective: Vitals: Today's Vitals   01/24/20 0121 01/24/20 0703 01/24/20 0952 01/24/20 1052  BP:  121/64    Pulse:  (!) 106    Resp:  20    Temp:  98.2 F (36.8 C)    TempSrc:  Oral    SpO2:  100%    Weight:      Height:      PainSc: 3   8  6       Intake/Output Summary (Last 24 hours) at 01/24/2020 1249 Last data filed at 01/24/2020 0900 Gross per 24 hour  Intake 1050 ml  Output 4200 ml  Net -3150 ml   Filed Weights   01/20/20 0457 01/21/20 1500 01/22/20 0416  Weight: 103 kg 100.1 kg 100.1 kg   Weight change:    Intake/Output from previous day: 07/09 0701 - 07/10 0700 In: 1050 [NG/GT:1050] Out: 5600 [Urine:5150; Stool:450] Intake/Output this shift: Total I/O In: -  Out: 100 [Stool:100]  Examination:  General exam: AAOx3, NGT+,NAD, weak appearing. HEENT:Oral mucosa moist, Ear/Nose WNL grossly, dentition normal. Respiratory system: bilaterally clear,no wheezing or crackles,no use of accessory muscle Cardiovascular system: S1 & S2 +, No JVD,. Gastrointestinal system: Abdomen soft, NT,ND, BS+ colostomy in place with stool. Nervous System:Alert, awake, moving extremities and grossly nonfocal Extremities: Mild edema more on lower extremities-distal peripheral pulses palpable.  Skin: No rashes,no icterus. MSK: Normal muscle bulk,tone, power Foley in place Wound present on the buttock and dressing intact Last photograph by surgery    Data Reviewed: I have personally reviewed following labs and imaging studies CBC: Recent Labs  Lab 01/18/20 0500 01/19/20 0340 01/20/20 0516 01/21/20 0823 01/22/20 0439 01/23/20 0441 01/24/20 0420  WBC 9.0   < > 14.5* 12.4* 10.6* 11.3* 9.8  NEUTROABS 6.0  --   --   --   --   --   --   HGB 7.1*   < > 8.1* 7.5* 7.4* 7.7* 7.8*  HCT 24.1*   < > 26.4* 24.2* 24.5* 25.4* 25.0*  MCV 98.8   < > 96.7 96.4 98.0 97.3 96.5  PLT 287   < > 302 306 306 386 378   < > = values in this interval not displayed.   Basic Metabolic Panel: Recent Labs  Lab 01/20/20 0516 01/21/20 0823 01/22/20 0439 01/23/20 0441 01/24/20 0420  NA 134* 134*  133* 135 132*  K 4.1 4.2 4.0 4.1 4.0  CL 93* 96* 95* 96* 94*  CO2 31 29 29 26 28   GLUCOSE 92 120* 145* 142* 143*  BUN 13 14 14 13 15   CREATININE <0.30*  0.31* <0.30* 0.31* <0.30*  CALCIUM 8.3* 8.3* 8.5* 8.9 8.8*   GFR: CrCl cannot be calculated (This lab value cannot be used to calculate CrCl because it is not a number: <0.30). Liver Function Tests: No results for input(s): AST, ALT, ALKPHOS, BILITOT, PROT, ALBUMIN in the last 168 hours. No results for input(s): LIPASE, AMYLASE in the last 168 hours. No results for input(s): AMMONIA in the last 168 hours. Coagulation Profile: No results for input(s): INR, PROTIME in the last 168 hours. Cardiac Enzymes: No results for input(s): CKTOTAL, CKMB, CKMBINDEX, TROPONINI in the last 168 hours. BNP (last 3 results) No results for input(s): PROBNP in the last 8760 hours. HbA1C: No results for input(s): HGBA1C in the last 72 hours. CBG: Recent Labs  Lab 01/23/20 2118 01/24/20 0011 01/24/20 0343 01/24/20 0812 01/24/20 1158  GLUCAP 111* 136* 123* 103* 142*   Lipid Profile: No results for input(s): CHOL, HDL, LDLCALC, TRIG, CHOLHDL, LDLDIRECT in the last 72 hours. Thyroid Function Tests: No results for input(s): TSH, T4TOTAL, FREET4, T3FREE, THYROIDAB in the last 72 hours. Anemia Panel: No results for input(s): VITAMINB12, FOLATE, FERRITIN, TIBC, IRON, RETICCTPCT in the last 72 hours. Sepsis Labs: No results for input(s): PROCALCITON, LATICACIDVEN in the last 168 hours.  No results found for this or any previous visit (from the past 240 hour(s)).    Radiology Studies: No results found.   LOS: 56 days   Antonieta Pert, MD [facility]  01/24/2020, 12:49 PM

## 2020-01-24 NOTE — Plan of Care (Signed)
  Problem: Education: Goal: Knowledge of General Education information will improve Description Including pain rating scale, medication(s)/side effects and non-pharmacologic comfort measures Outcome: Progressing   

## 2020-01-24 NOTE — Plan of Care (Signed)
  Problem: Clinical Measurements: Goal: Will remain free from infection Outcome: Progressing   Problem: Clinical Measurements: Goal: Respiratory complications will improve Outcome: Progressing   Problem: Clinical Measurements: Goal: Cardiovascular complication will be avoided Outcome: Progressing   Problem: Education: Goal: Knowledge of General Education information will improve Description: Including pain rating scale, medication(s)/side effects and non-pharmacologic comfort measures Outcome: Progressing

## 2020-01-24 NOTE — Progress Notes (Signed)
Pt stable at this time. No needs to report. Pt alert and oriented. No signs of distress.

## 2020-01-25 LAB — CBC
HCT: 24.6 % — ABNORMAL LOW (ref 36.0–46.0)
Hemoglobin: 7.6 g/dL — ABNORMAL LOW (ref 12.0–15.0)
MCH: 29.8 pg (ref 26.0–34.0)
MCHC: 30.9 g/dL (ref 30.0–36.0)
MCV: 96.5 fL (ref 80.0–100.0)
Platelets: 399 10*3/uL (ref 150–400)
RBC: 2.55 MIL/uL — ABNORMAL LOW (ref 3.87–5.11)
RDW: 20.1 % — ABNORMAL HIGH (ref 11.5–15.5)
WBC: 12.3 10*3/uL — ABNORMAL HIGH (ref 4.0–10.5)
nRBC: 0 % (ref 0.0–0.2)

## 2020-01-25 LAB — GLUCOSE, CAPILLARY
Glucose-Capillary: 99 mg/dL (ref 70–99)
Glucose-Capillary: 122 mg/dL — ABNORMAL HIGH (ref 70–99)
Glucose-Capillary: 132 mg/dL — ABNORMAL HIGH (ref 70–99)
Glucose-Capillary: 110 mg/dL — ABNORMAL HIGH (ref 70–99)

## 2020-01-25 LAB — BASIC METABOLIC PANEL
Anion gap: 11 (ref 5–15)
BUN: 15 mg/dL (ref 6–20)
CO2: 28 mmol/L (ref 22–32)
Calcium: 8.8 mg/dL — ABNORMAL LOW (ref 8.9–10.3)
Chloride: 93 mmol/L — ABNORMAL LOW (ref 98–111)
Creatinine, Ser: <0.3 mg/dL — ABNORMAL LOW (ref 0.44–1.00)
Glucose, Bld: 156 mg/dL — ABNORMAL HIGH (ref 70–99)
Potassium: 4.3 mmol/L (ref 3.5–5.1)
Sodium: 132 mmol/L — ABNORMAL LOW (ref 135–145)

## 2020-01-25 MED ORDER — SALINE SPRAY 0.65 % NA SOLN
1.0000 | NASAL | Status: DC | PRN
  Filled 2020-01-25: qty 44

## 2020-01-25 NOTE — Progress Notes (Signed)
   01/25/20 1119  Assess: MEWS Score  Temp 98.5 F (36.9 C)  BP 110/80  Pulse Rate (!) 106  SpO2 99 %  O2 Device Room Air  Assess: MEWS Score  MEWS Temp 0  MEWS Systolic 0  MEWS Pulse 1  MEWS RR 1  MEWS LOC 0  MEWS Score 2  MEWS Score Color Yellow  Assess: if the MEWS score is Yellow or Red  Were vital signs taken at a resting state? Yes  Focused Assessment Documented focused assessment  Early Detection of Sepsis Score *See Row Information* Low  MEWS guidelines implemented *See Row Information* No, previously yellow, continue vital signs every 4 hours  Notify: Charge Nurse/RN  Name of Charge Nurse/RN Notified atie R  Date Charge Nurse/RN Notified 01/25/20  Time Charge Nurse/RN Notified 1121

## 2020-01-25 NOTE — Progress Notes (Signed)
PROGRESS NOTE    Alyssa Carey  RXV:[id number] DOB: 1971/07/06 DOA: 12/21/2019 PCP: Patient, No Pcp Per   Chef Complaints:: Alcohol withdrawal   Brief Narrative:49 year old female who presented secondary to concern for alcohol withdrawal and quickly developed evidence of necrotizing fasciitis, emergently managed in the OR with critical care, general surgery with I&D.  Patient was in ICU needing intubation and vasopressors for septic shock underwent repeat I&D and diverting colostomy.  She has been extubated and off vasopressors. remains in the stepdown unit.  Wound is slowly healing being managed by surgery wound care and had been on hydrotherapy Monday Wednesday Friday.  Followed by speech therapy oral intake inadequate on modified diet and on NG tube feeding. Patient is slowly improving. She had evidence of fluid overload with weight increased up to 260 pound from admission weight of 193-212 lb-placed on IV Lasix with good output and improving weight and switched to oral Lasix, wt down to 220 .   Significant events 6/06 admission 6/08 ICU admission to OR ofr I&D, back intubated and on pressors, 6/09 started tube feeds. Still pressor dependent,kept on vent w/ anticipated return to OR planned  6/10 Back to the OR for significant debridement and also placement of diverting colostomy 6/11 Sedated, low-dose phenylephrine. CCS felt wound looking good. Sedation changed to precedex / PRN fent  6/12 more awake, on 15/5 pressure support 6/13 family discussion >> DNR, continue medical care 6//15 transfuse 1 unit PRBC; vomiting, tube feeds held 6/16 Vomiting overnight 6/17 Trial PSV 15/5 with variable Vt, remains on vasopressors. Vomiting overnight  6/18 transfuse 1 unit PRBC 6/20 Off pressors, On precedex and weaning on PSV  6/21 Weaning on PSV, planned hydrotherapy 6/23 Extubated 6/24 Transferred to [facility] service. 01/22/20- Tx out of SDU  Subjective: Awake alert reports she did not sleep last  night wants to take a power nap and be left alone. Has been tachycardic asymptomatic  Remains on NG tube feeding with full liquid diet  Requesting saline nasal spray for nose bleeding  Assessment & Plan:  Necrotizing fasciitis of pelvic region and thigh: Patient had emergent operative intervention with incision and debridement, diverting colostomy and had multiple rounds of IV antibiotics (last dose 6/22)-and her wound culture grew E. coli and VRE. Continue the Foley to promote healing, continue wound care as per general surgery/wound care. Patient received hydrotherapy Monday Wednesday Friday. Wound is overall healing well. Continue to augment nutrition, multivitamins, zinc.  Septic shock secondary to #- resovled. 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 Speech following closely remains on full liquid nectar thick and NGT feeding. Patient able to speak and interact.  Leukocytosis: Patient is afebrile, white counts ranging from 12-16 K. Today at 9.8K. Recent Labs  Lab 01/21/20 0823 01/22/20 0439 01/23/20 0441 01/24/20 0420 01/25/20 0452  WBC 12.4* 10.6* 11.3* 9.8 12.3*   Hypokalemia/hypomagnesemia: Potassium at 4.0, remains on 20 M EQ KCl twice daily while on lasix  Acute hypoxic respiratory failure: Resolved. On room air.  Severe protein calorie malnutrition with low albumin, anasarca with poor nutritional status due to severe illness: Augment nutrition with tube feeding, appreciate dietitian input.  Continue multivitamins.    Fluid overload/anasarca: Suspecting  from fluid resuscitation antibiotics and hypoalbuminemia On admission weight was 193  to 213 pounds, on 7/1:256.1- 260 lb and patient was diuresed with IV Lasix with improved now on oral Lasix, order to 20 pound. Monitor weight and further decrease the dose of Lasix  Pressure injury/DTI of right ear not present on admission.  Continue local supportive care and dressing.  AKI with unknown baseline 1.7 upon admission: Renal function is stable tolerating Lasix. Recent Labs  Lab 01/21/20 0823 01/22/20 0439 01/23/20 0441 01/24/20 0420 01/25/20 0452  BUN 14 14 13 15 15   CREATININE 0.31* <0.30* 0.31* <0.30* <0.30*   Intermittent hypo-glycemia: Resolved.  Recent Labs  Lab 01/24/20 1158 01/24/20 1707 01/24/20 2044 01/25/20 0809 01/25/20 1157  GLUCAP 142* 122* 112* 99 122*   Anemia of critical illness/acute blood loss anemia Baseline hemoglobin 12-13 developed anemia in the setting of infection critical illness bleeding.Patient has rectal bleeding GI consulted and performed bedside flexible sigmoidoscopy significant for 10 cm Stercoral ulcer 6/28 received 3 units PRBC. Hemoglobin overall stable. Monitor and transfuse for less than 7 g. Recent Labs  Lab 01/21/20 0823 01/22/20 0439 01/23/20 0441 01/24/20 0420 01/25/20 0452  HGB 7.5* 7.4* 7.7* 7.8* 7.6*  HCT 24.2* 24.5* 25.4* 25.0* 24.6*   Morbid obesity with BMI 43.9.  With diuresis weight is improved BMI down to 37.  Will benefit from weight loss and health lifestyle and outpatient follow-up.    Sinus tachycardia heart rate intermittently elevated ranging from 100-120s and this has been making her MEWS red   She has had extensive evaluation negative for DVT in lower extremities and upper extremities.  She had CT of the chest negative for PE.  TSH is normal.  Continue metoprolol low-dose and adjust slowly.  Patient reports she does have baseline tachycardia at home.  Erythema generalized: likely from use of Purple Wipe by patient's family and patient's family has been educated on not to do anything without asking patient's  nurses.erythema has resolved. Monitor.   DVT prophylaxis: Place and maintain sequential compression device Start: 01/10/20 1738.  Lovenox stopped due to bleeding from the wound.  Will consider resuming Lovenox soon as no more bleeding. Code Status: full Family Communication: plan of care discussed with patient and nursing staffs at bedside.  Status is: Inpatient  Remains inpatient appropriate because:Inpatient level of care appropriate due to severity of illness and For ongoing management wound, postop, multiple issues, poor oral intake remains tachycardic with adjusting metoprolol, and also fluid overload and getting diuresed with Lasix.  Receiving nutrition with poor intake speech following and remain on NG tube feeding.  Dispo: The patient is from: Home              Anticipated d/c is to: CIR versus SNF. Continue PT OT. Patient to be placed in difficult to place list.               Anticipated d/c date is: > 3 days              Patient currently is not medically stable.  Continue to provide nutritional support, NG tube, speech following to advance diet.  Continue wound care given her extensive wound.  Continue diverting colostomy in place will need reversal once wound heals.  Foley in place.  She is difficult to place.   Nutrition: Nutrition and speech are following. Diet Order            Diet full liquid Room service appropriate? Yes; Fluid consistency: Nectar Thick  Diet effective now                 Nutrition Problem: Increased nutrient needs Etiology: chronic illness, wound healing (alcohol abuse; necrotizing fasciitis) Signs/Symptoms: estimated needs Interventions: Tube feeding, Prostat Body mass index is 35.31 kg/m. Pressure Ulcer: Pressure Injury 12/27/19 Ear Right Stage 3 -  Full thickness tissue loss. Subcutaneous fat may be visible but bone, tendon or muscle are NOT exposed. previously noted unstageable wound has evolved into a healing stage 3 when assessed on 7/5 (Active)   12/27/19 1200  Location: Ear  Location Orientation: Right  Staging: Stage 3 -  Full thickness tissue loss. Subcutaneous fat may be visible but bone, tendon or muscle are NOT exposed.  Wound Description (Comments): previously noted unstageable wound has evolved into a healing stage 3 when assessed on 7/5  Present on Admission: No   Consultants:see note  Procedures:  ENDOTRACHEAL INTUBATION (6/8 >> 01/07/2020)  INCISION AND DEBRIDEMENT OF LEFT BUTTOCK AND PERINEUM (12/23/2019)  IRRIGATION AND DEBRIDEMENT OF BUTTOCKS; LAPAROSCOPIC LOOP COLOSTOMY (12/25/2019)  FLEXIBLE SIGMOIDOSCOPY (01/11/2020)   Microbiology:see note  Medications: Scheduled Meds: . ascorbic acid  500 mg Per Tube BID  . Chlorhexidine Gluconate Cloth  6 each Topical Daily  . collagenase   Topical Daily  . ergocalciferol  50,000 Units Per Tube Q Fri  . feeding supplement (PRO-STAT SUGAR FREE 64)  30 mL Per Tube Daily  . ferrous sulfate  300 mg Per Tube BID WC  . folic acid  1 mg Per Tube Daily  . furosemide  40 mg Oral Daily  . insulin aspart  0-6 Units Subcutaneous Q4H  . insulin aspart  4 Units Subcutaneous Q4H  . mouth rinse  15 mL Mouth Rinse BID  . melatonin  3 mg Oral QHS  . metoprolol tartrate  12.5 mg Per Tube BID  . multivitamin with minerals  1 tablet Per Tube Daily  . pantoprazole sodium  40 mg Per Tube BID  . potassium chloride  20 mEq Per Tube BID  . sodium chloride flush  10-40 mL Intracatheter Q12H  . thiamine  100 mg Per Tube Daily  . traZODone  50 mg Oral QHS  . zinc sulfate  220 mg Per Tube Daily   Continuous Infusions: . sodium chloride    . feeding supplement (PIVOT 1.5 CAL) 1,000 mL (01/25/20 0700)    Antimicrobials:  Ceftriaxone  Vancomycin  Clindamycin  Linezolid  Flagyl  Finished all antibiotic therapy. Anti-infectives (From admission, onward)   Start     Dose/Rate Route Frequency Ordered Stop   12/31/19 1600  metroNIDAZOLE (FLAGYL) IVPB 500 mg  Status:  Discontinued         500 mg 100 mL/hr over 60 Minutes Intravenous Every 8 hours 12/31/19 1505 01/06/20 0859   12/28/19 1400  vancomycin (VANCOCIN) IVPB 1000 mg/200 mL premix  Status:  Discontinued        1,000 mg 200 mL/hr over 60 Minutes Intravenous Every 24 hours 12/28/19 1005 12/28/19 1343   12/28/19 1400  linezolid (ZYVOX) IVPB 600 mg        600 mg 300 mL/hr over 60 Minutes Intravenous Every 12 hours 12/28/19 1343 01/06/20 1954   12/26/19 1948  vancomycin variable dose per unstable renal function (pharmacist dosing)  Status:  Discontinued         Does not apply See admin instructions 12/26/19 1948 12/28/19 1005   12/23/19 2000  vancomycin (VANCOCIN) IVPB 1000 mg/200 mL premix  Status:  Discontinued  1,000 mg 200 mL/hr over 60 Minutes Intravenous Every 8 hours 12/23/19 1027 12/26/19 1944   12/23/19 1300  clindamycin (CLEOCIN) IVPB 900 mg  Status:  Discontinued        900 mg 100 mL/hr over 30 Minutes Intravenous Every 8 hours 12/23/19 1205 12/31/19 1505   12/23/19 1200  metroNIDAZOLE (FLAGYL) IVPB 500 mg  Status:  Discontinued        500 mg 100 mL/hr over 60 Minutes Intravenous Every 8 hours 12/23/19 1135 12/23/19 1254   12/23/19 1045  vancomycin (VANCOREADY) IVPB 2000 mg/400 mL        2,000 mg 200 mL/hr over 120 Minutes Intravenous NOW 12/23/19 1018 12/23/19 1441   12/23/19 0300  cefTRIAXone (ROCEPHIN) 2 g in sodium chloride 0.9 % 100 mL IVPB  Status:  Discontinued        2 g 200 mL/hr over 30 Minutes Intravenous Every 24 hours 12/23/19 0236 01/06/20 0859       Objective: Vitals: Today's Vitals   01/25/20 0642 01/25/20 0945 01/25/20 1059 01/25/20 1119  BP:    110/80  Pulse:    (!) 106  Resp:   (!) 22   Temp:    98.5 F (36.9 C)  TempSrc:    Oral  SpO2:    99%  Weight:      Height:      PainSc: 0-No pain 0-No pain      Intake/Output Summary (Last 24 hours) at 01/25/2020 1202 Last data filed at 01/25/2020 0700 Gross per 24 hour  Intake 3093.67 ml  Output 4500 ml  Net -1406.33 ml    Filed Weights   01/21/20 1500 01/22/20 0416 01/25/20 0500  Weight: 100.1 kg 100.1 kg 93.3 kg   Weight change:    Intake/Output from previous day: 07/10 0701 - 07/11 0700 In: 3093.7 [P.O.:1920; NG/GT:1173.7] Out: 4600 [Urine:4500; Stool:100] Intake/Output this shift: No intake/output data recorded.  Examination:  General exam: AAOx3, NGT+,NAD, weak appearing. HEENT:Oral mucosa moist, Ear/Nose WNL grossly, dentition normal. Respiratory system: bilaterally clear,no wheezing or crackles,no use of accessory muscle Cardiovascular system: S1 & S2 +, No JVD,. Gastrointestinal system: Abdomen soft, NT,ND, BS+ colostomy in place with stool. Nervous System:Alert, awake, moving extremities and grossly nonfocal Extremities: Mild edema more on lower extremities-distal peripheral pulses palpable.  Skin: No rashes,no icterus. MSK: Normal muscle bulk,tone, power Foley in place Wound present on the buttock and dressing intact Last photograph by surgery    Data Reviewed: I have personally reviewed following labs and imaging studies CBC: Recent Labs  Lab 01/21/20 0823 01/22/20 0439 01/23/20 0441 01/24/20 0420 01/25/20 0452  WBC 12.4* 10.6* 11.3* 9.8 12.3*  HGB 7.5* 7.4* 7.7* 7.8* 7.6*  HCT 24.2* 24.5* 25.4* 25.0* 24.6*  MCV 96.4 98.0 97.3 96.5 96.5  PLT 306 306 386 378 382   Basic Metabolic Panel: Recent Labs  Lab 01/21/20 0823 01/22/20 0439 01/23/20 0441 01/24/20 0420 01/25/20 0452  NA 134* 133* 135 132* 132*  K 4.2 4.0 4.1 4.0 4.3  CL 96* 95* 96* 94* 93*  CO2 29 29 26 28 28   GLUCOSE 120* 145* 142* 143* 156*  BUN 14 14 13 15 15   CREATININE 0.31* <0.30* 0.31* <0.30* <0.30*  CALCIUM 8.3* 8.5* 8.9 8.8* 8.8*   GFR: CrCl cannot be calculated (This lab value cannot be used to calculate CrCl because it is not a number: <0.30). Liver Function Tests: No results for input(s): AST, ALT, ALKPHOS, BILITOT, PROT, ALBUMIN in the last 168 hours. No results for  input(s): LIPASE,  AMYLASE in the last 168 hours. No results for input(s): AMMONIA in the last 168 hours. Coagulation Profile: No results for input(s): INR, PROTIME in the last 168 hours. Cardiac Enzymes: No results for input(s): CKTOTAL, CKMB, CKMBINDEX, TROPONINI in the last 168 hours. BNP (last 3 results) No results for input(s): PROBNP in the last 8760 hours. HbA1C: No results for input(s): HGBA1C in the last 72 hours. CBG: Recent Labs  Lab 01/24/20 1158 01/24/20 1707 01/24/20 2044 01/25/20 0809 01/25/20 1157  GLUCAP 142* 122* 112* 99 122*   Lipid Profile: No results for input(s): CHOL, HDL, LDLCALC, TRIG, CHOLHDL, LDLDIRECT in the last 72 hours. Thyroid Function Tests: No results for input(s): TSH, T4TOTAL, FREET4, T3FREE, THYROIDAB in the last 72 hours. Anemia Panel: No results for input(s): VITAMINB12, FOLATE, FERRITIN, TIBC, IRON, RETICCTPCT in the last 72 hours. Sepsis Labs: No results for input(s): PROCALCITON, LATICACIDVEN in the last 168 hours.  No results found for this or any previous visit (from the past 240 hour(s)).    Radiology Studies: No results found.   LOS: 35 days   Georgette Shell, MD [facility]  01/25/2020, 12:02 PM

## 2020-01-26 LAB — CBC
HCT: 25.9 % — ABNORMAL LOW (ref 36.0–46.0)
Hemoglobin: 7.9 g/dL — ABNORMAL LOW (ref 12.0–15.0)
MCH: 29.4 pg (ref 26.0–34.0)
MCHC: 30.5 g/dL (ref 30.0–36.0)
MCV: 96.3 fL (ref 80.0–100.0)
Platelets: 429 10*3/uL — ABNORMAL HIGH (ref 150–400)
RBC: 2.69 MIL/uL — ABNORMAL LOW (ref 3.87–5.11)
RDW: 20.1 % — ABNORMAL HIGH (ref 11.5–15.5)
WBC: 11.6 10*3/uL — ABNORMAL HIGH (ref 4.0–10.5)
nRBC: 0 % (ref 0.0–0.2)

## 2020-01-26 LAB — BASIC METABOLIC PANEL
Anion gap: 10 (ref 5–15)
BUN: 15 mg/dL (ref 6–20)
CO2: 29 mmol/L (ref 22–32)
Calcium: 9.1 mg/dL (ref 8.9–10.3)
Chloride: 96 mmol/L — ABNORMAL LOW (ref 98–111)
Creatinine, Ser: <0.3 mg/dL — ABNORMAL LOW (ref 0.44–1.00)
Glucose, Bld: 128 mg/dL — ABNORMAL HIGH (ref 70–99)
Potassium: 3.7 mmol/L (ref 3.5–5.1)
Sodium: 135 mmol/L (ref 135–145)

## 2020-01-26 LAB — GLUCOSE, CAPILLARY
Glucose-Capillary: 112 mg/dL — ABNORMAL HIGH (ref 70–99)
Glucose-Capillary: 113 mg/dL — ABNORMAL HIGH (ref 70–99)
Glucose-Capillary: 134 mg/dL — ABNORMAL HIGH (ref 70–99)
Glucose-Capillary: 139 mg/dL — ABNORMAL HIGH (ref 70–99)
Glucose-Capillary: 88 mg/dL (ref 70–99)
Glucose-Capillary: 99 mg/dL (ref 70–99)

## 2020-01-26 MED ORDER — ENOXAPARIN SODIUM 40 MG/0.4ML ~~LOC~~ SOLN
40.0000 mg | SUBCUTANEOUS | Status: DC
  Administered 2020-01-26 – 2020-02-02 (×8): 40 mg via SUBCUTANEOUS
  Filled 2020-01-26 (×8): qty 0.4

## 2020-01-26 MED ORDER — INSULIN ASPART 100 UNIT/ML ~~LOC~~ SOLN: 0.0000 [IU] | Freq: Four times a day (QID) | SUBCUTANEOUS | Status: DC

## 2020-01-26 MED ORDER — AQUAPHOR EX OINT
TOPICAL_OINTMENT | Freq: Three times a day (TID) | CUTANEOUS | Status: DC
  Administered 2020-02-02 (×2): 1 via TOPICAL
  Filled 2020-01-26: qty 50

## 2020-01-26 MED ORDER — INSULIN GLARGINE 100 UNIT/ML ~~LOC~~ SOLN
8.0000 [IU] | Freq: Every day | SUBCUTANEOUS | Status: DC
  Administered 2020-01-26 – 2020-01-27 (×2): 8 [IU] via SUBCUTANEOUS
  Filled 2020-01-26 (×3): qty 0.08

## 2020-01-26 NOTE — Progress Notes (Signed)
PT HYDROTHERAPY TX NOTE  01/26/20 1400  Subjective Assessment  Subjective hey!  Patient and Family Stated Goals agreed to wound care today,   Date of Onset  (present on admission)  Prior Treatments s/p surgical I & D x2, diverting colostomy  Evaluation and Treatment  Evaluation and Treatment Procedures Explained to Patient/Family Yes  Evaluation and Treatment Procedures agreed to  Wound / Incision (Open or Dehisced) 12/27/19 Non-pressure wound Buttocks Left *PT ONLY* open wound L gluteal area  Date First Assessed/Time First Assessed: 12/27/19 1410   Wound Type: Non-pressure wound  Location: Buttocks  Location Orientation: Left  Wound Description (Comments): *PT ONLY* open wound L gluteal area  Present on Admission: (c)   Dressing Type ABD;Moist to dry (collagenase)  Dressing Changed Changed  Dressing Change Frequency Twice a day  Site / Wound Assessment Yellow;Red  % Wound base Red or Granulating 90%  % Wound base Yellow/Fibrinous Exudate 10%  Peri-wound Assessment Intact  Wound Length (cm) 16 cm  Wound Width (cm) 14 cm  Wound Depth (cm) 4 cm  Wound Volume (cm^3) 896 cm^3  Wound Surface Area (cm^2) 224 cm^2  Tunneling (cm)  (folds, no true tunneling)  Margins Unattached edges (unapproximated)  Closure None  Drainage Amount Moderate  Drainage Description No odor;Serosanguineous  Treatment Cleansed;Debridement (Selective);Hydrotherapy (Pulse lavage);Packing (Saline gauze)  Hydrotherapy  Pulsed lavage therapy - wound location L gluteal area   Pulsed Lavage with Suction (psi) 12 psi  Pulsed Lavage with Suction - Normal Saline Used 1000 mL  Pulsed Lavage Tip Tip with splash shield  Selective Debridement  Selective Debridement - Location right proximal-lateral portion wound L gluteal wound   Selective Debridement - Tools Used Forceps;Scissors  Selective Debridement - Tissue Removed necrotic adipose tissue/fascia, slough   Wound Therapy - Assess/Plan/Recommendations  Wound Therapy  - Clinical Statement wound continues to progress, increasing granulation tissue. will  continue hydrotherapy/debridement  as needed 3x/wk  Wound Therapy - Functional Problem List decr mobility/weakness  Factors Delaying/Impairing Wound Healing Substance abuse;Immobility;Multiple medical problems  Hydrotherapy Plan Debridement;Dressing change;Pulsatile lavage with suction  Wound Therapy - Frequency 3X / week  Wound Therapy - Current Recommendations PT  Wound Therapy - Follow Up Recommendations Skilled nursing facility  Wound Plan Pt will benefit from hydrotherapy as part of a multi-modal/multi-disciplinary wound care to approach to facilitate healing and decr bioburden.  Likely will see pt for through this  week and then d/c hydrotherapy  Wound Therapy Goals - Improve the function of patient's integumentary system by progressing the wound(s) through the phases of wound healing by:  Decrease Necrotic Tissue to 100  Decrease Necrotic Tissue - Progress Progressing toward goal  Increase Granulation Tissue to 0  Increase Granulation Tissue - Progress Progressing toward goal  Decrease Length/Width/Depth by (cm) 1/1/1  Decrease Length/Width/Depth - Progress Updated due to goal met  Improve Drainage Characteristics Min  Improve Drainage Characteristics - Progress Progressing toward goal  Patient/Family will be able to  perform pressure relief & verbalize dressing changes  Patient/Family Instruction Goal - Progress Goal set today  Goals/treatment plan/discharge plan were made with and agreed upon by patient/family Yes  Time For Goal Achievement 2 weeks  Wound Therapy - Potential for Goals Alyssa Carey, PT  Acute Rehab Dept [facility]) [phone] Pager [phone]  01/26/2020

## 2020-01-26 NOTE — NC FL2 (Signed)
[place] LEVEL OF CARE SCREENING TOOL     IDENTIFICATION  Patient Name: Alyssa Carey Birthdate: 1970-10-25 Sex: female Admission Date (Current Location): 12/21/2019  [facility] and [place] Number:  Engineer, manufacturing systems and Address:  [facility],  [place] [address], [place]      Provider Number: [id number]  Attending Physician Name and Address:  Antonieta Pert, MD  Relative Name and Phone Number:  Ravan Schlemmer    Current Level of Care: Hospital Recommended Level of Care: [place] Prior Approval Number:    Date Approved/Denied:   PASRR Number:    Discharge Plan: SNF    Current Diagnoses: Patient Active Problem List   Diagnosis Date Noted  . 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 Aphonia 01/12/2020  . 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 Hypernatremia 01/12/2020  . Anasarca 01/12/2020  . Necrotizing fasciitis of pelvic region and thigh ([place]) 12/28/2019  . 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 Acute respiratory failure with hypoxemia ([place])   . Elevated LFTs   . Pressure injury of skin 12/22/2019  . Fall at home, initial encounter 12/21/2019  . Weakness 12/21/2019  . Alcoholism ([place]) 12/21/2019  . Thrombocytopenia ([place]) 12/21/2019  . ARF (acute renal failure) ([place]) 12/21/2019  . Hepatic encephalopathy ([place]) 12/21/2019  . Alcoholic liver disease ([place]) 12/21/2019    Orientation RESPIRATION BLADDER Height & Weight     Self, Time, Situation  Normal Indwelling catheter Weight: 91.3 kg Height:  5' 4"  (162.6 cm)  BEHAVIORAL SYMPTOMS/MOOD NEUROLOGICAL BOWEL NUTRITION STATUS      Colostomy Diet (Nectar thick)  AMBULATORY STATUS COMMUNICATION OF NEEDS Skin   Limited Assist Verbally PU Stage and Appropriate Care (buttock,pelvic/thigh wounds-w-d dsg bid)     PU Stage 3 Dressing: BID                 Personal Care Assistance Level of Assistance  Bathing,  Dressing, Feeding Bathing Assistance: Limited assistance Feeding assistance: Limited assistance Dressing Assistance: Limited assistance     Functional Limitations Info  Sight, Hearing, Speech Sight Info: Impaired (eyeglasses) Hearing Info: Adequate Speech Info: Impaired (Nectar thick.)    SPECIAL CARE FACTORS FREQUENCY  PT (By licensed PT), OT (By licensed OT)     PT Frequency: 5x week OT Frequency: 5x week            Contractures Contractures Info: Not present    Additional Factors Info  Insulin Sliding Scale, Psychotropic, Code Status (Wean off Tube Feeds) Code Status Info: Full code   Psychotropic Info: Trazadone-sleep Insulin Sliding Scale Info: SSI-see MAR       Current Medications (01/26/2020):  This is the current hospital active medication list Current Facility-Administered Medications  Medication Dose Route Frequency Provider Last Rate Last Admin  . 0.9 %  sodium chloride infusion   Intravenous PRN Chesley Mires, MD      . acetaminophen (TYLENOL) 160 MG/5ML solution 650 mg  650 mg Per Tube Q6H PRN Antonieta Pert, MD   650 mg at 01/26/20 0829  . ascorbic acid (VITAMIN C) 500 MG/5ML syrup 500 mg  500 mg Per Tube BID Mariel Aloe, MD   500 mg at 01/26/20 1047  . Chlorhexidine Gluconate Cloth 2 % PADS 6 each  6 each Topical Daily Greer Pickerel, MD   6 each at 01/26/20 1048  . collagenase (SANTYL) ointment   Topical Daily Norm Parcel, [place]  1 application at 53/20/23 1048  . diphenhydrAMINE (BENADRYL) 12.5 MG/5ML elixir 25 mg  25 mg Per Tube Q6H PRN Kc, Ramesh, MD   25 mg at 01/25/20 1401  . ergocalciferol (DRISDOL) 200 MCG/ML drops 50,000 Units  50,000 Units Per Tube Q Richardean Sale, MD   50,000 Units at 01/23/20 1010  . feeding supplement (PIVOT 1.5 CAL) liquid 1,000 mL  1,000 mL Per Tube Continuous Kc, Ramesh, MD 70 mL/hr at 01/25/20 0700 1,000 mL at 01/25/20 0700  . feeding supplement (PRO-STAT SUGAR FREE 64) liquid 30 mL  30 mL Per Tube Daily Mariel Aloe, MD   30 mL at 01/26/20 1047  . fentaNYL (SUBLIMAZE) injection 50-100 mcg  50-100 mcg Intravenous Q4H PRN Noe Gens L, NP   50 mcg at 01/26/20 1229  . ferrous sulfate 300 (60 Fe) MG/5ML syrup 300 mg  300 mg Per Tube BID WC Mariel Aloe, MD   300 mg at 01/26/20 0829  . folic acid (FOLVITE) tablet 1 mg  1 mg Per Tube Daily Mariel Aloe, MD   1 mg at 01/26/20 1047  . furosemide (LASIX) tablet 40 mg  40 mg Oral Daily Kc, Ramesh, MD   40 mg at 01/26/20 1047  . insulin aspart (novoLOG) injection 0-6 Units  0-6 Units Subcutaneous Q6H Kc, Ramesh, MD      . insulin glargine (LANTUS) injection 8 Units  8 Units Subcutaneous Daily Kc, Ramesh, MD   8 Units at 01/26/20 1047  . lidocaine (XYLOCAINE) 2 % jelly 1 application  1 application Topical Once PRN Izora Gala, MD      . lidocaine (XYLOCAINE) 4 % external solution 0-50 mL  0-50 mL Topical Once PRN Izora Gala, MD      . lidocaine-EPINEPHrine (XYLOCAINE-EPINEPHrine) 1 %-1:200000 (PF) injection 0-30 mL  0-30 mL Intradermal Once PRN Izora Gala, MD      . MEDLINE mouth rinse  15 mL Mouth Rinse BID Kc, Ramesh, MD   15 mL at 01/25/20 0940  . melatonin tablet 3 mg  3 mg Oral QHS Kc, Ramesh, MD   3 mg at 01/25/20 2334  . metoprolol tartrate (LOPRESSOR) 25 mg/10 mL oral suspension 12.5 mg  12.5 mg Per Tube BID Kc, Ramesh, MD   12.5 mg at 01/26/20 1047  . metoprolol tartrate (LOPRESSOR) injection 5 mg  5 mg Intravenous Q6H PRN Barb Merino, MD   5 mg at 01/20/20 0103  . mineral oil-hydrophilic petrolatum (AQUAPHOR) ointment   Topical TID Kc, Ramesh, MD      . multivitamin with minerals tablet 1 tablet  1 tablet Per Tube Daily Mariel Aloe, MD   1 tablet at 01/26/20 1047  . ondansetron (ZOFRAN) injection 4 mg  4 mg Intravenous Q6H PRN Greer Pickerel, MD      . oxyCODONE (Oxy IR/ROXICODONE) immediate release tablet 5 mg  5 mg Oral Q4H PRN Barb Merino, MD   5 mg at 01/25/20 1215  . oxymetazoline (AFRIN) 0.05 % nasal spray 1 spray  1 spray Each Nare  Once PRN Izora Gala, MD      . pantoprazole sodium (PROTONIX) 40 mg/20 mL oral suspension 40 mg  40 mg Per Tube BID Mariel Aloe, MD   40 mg at 01/26/20 1229  . phenol (CHLORASEPTIC) mouth spray 1 spray  1 spray Mouth/Throat PRN Lovey Newcomer T, NP   1 spray at 01/24/20 0402  . pneumococcal 23 valent vaccine (PNEUMOVAX-23) injection 0.5 mL  0.5 mL  Intramuscular Prior to discharge Simonne Maffucci B, MD      . potassium chloride 20 MEQ/15ML (10%) solution 20 mEq  20 mEq Per Tube BID Kc, Ramesh, MD   20 mEq at 01/26/20 1047  . Resource ThickenUp Clear   Oral PRN Mariel Aloe, MD   Given at 01/09/20 1730  . silver nitrate applicators applicator 1 Stick  1 Stick Topical Once PRN Izora Gala, MD      . sodium chloride (OCEAN) 0.65 % nasal spray 1 spray  1 spray Each Nare PRN Georgette Shell, MD      . sodium chloride flush (NS) 0.9 % injection 10-40 mL  10-40 mL Intracatheter Q12H Greer Pickerel, MD   10 mL at 01/26/20 1048  . thiamine tablet 100 mg  100 mg Per Tube Daily Mariel Aloe, MD   100 mg at 01/26/20 1047  . traZODone (DESYREL) tablet 50 mg  50 mg Oral QHS Antonieta Pert, MD   50 mg at 01/25/20 2334  . Triple Antibiotic [phone] OINT 1 application  1 application Apply externally Once PRN Izora Gala, MD      . zinc sulfate capsule 220 mg  220 mg Per Tube Daily Kc, Ramesh, MD   220 mg at 01/26/20 1047     Discharge Medications: Please see discharge summary for a list of discharge medications.  Relevant Imaging Results:  Relevant Lab Results:   Additional Information SS#[phone]  Dessa Phi, RN

## 2020-01-26 NOTE — Progress Notes (Signed)
PROGRESS NOTE    Alyssa Carey  ZOX:[id number] DOB: Dec 27, 1970 DOA: 12/21/2019 PCP: Patient, No Pcp Per   Chef Complaints:: Alcohol withdrawal   Brief Narrative:49 year old female who presented secondary to concern for alcohol withdrawal and quickly developed evidence of necrotizing fasciitis, emergently managed in the OR with critical care, general surgery with I&D.  Patient was in ICU needing intubation and vasopressors for septic shock underwent repeat I&D and diverting colostomy.  She has been extubated and off vasopressors. remains in the stepdown unit.  Wound is slowly healing being managed by surgery wound care and had been on hydrotherapy Monday Wednesday Friday.  Followed by speech therapy oral intake inadequate on modified diet and on NG tube feeding. Patient is slowly improving. She had evidence of fluid overload with weight increased up to 260 pound from admission weight of 193-212 lb-placed on IV Lasix with good output and improving weight and switched to oral Lasix, wt down to 220 .   Significant events 6/06 admission 6/08 ICU admission to OR ofr I&D, back intubated and on pressors, 6/09 started tube feeds. Still pressor dependent,kept on vent w/ anticipated return to OR planned  6/10 Back to the OR for significant debridement and also placement of diverting colostomy 6/11 Sedated, low-dose phenylephrine. CCS felt wound looking good. Sedation changed to precedex / PRN fent  6/12 more awake, on 15/5 pressure support 6/13 family discussion >> DNR, continue medical care 6//15 transfuse 1 unit PRBC; vomiting, tube feeds held 6/16 Vomiting overnight 6/17 Trial PSV 15/5 with variable Vt, remains on vasopressors. Vomiting overnight  6/18 transfuse 1 unit PRBC 6/20 Off pressors, On precedex and weaning on PSV  6/21 Weaning on PSV, planned hydrotherapy 6/23 Extubated 6/24 Transferred to [facility] service. 01/22/20- Tx out of SDU Patient had evidence of fluid overload with weight gain of  2 to 60 pounds from baseline around 200s diarrhea.  IV Lasix with a significantly improved , lasix stopped 7/12. 7/12: Surgery on eval satisfied with her wound healing and will continue twice daily WD dressing and signed off  Subjective: Tired of too much fingerstick  no nausea or vomiting on NGT Po intake picking up NGT irritating her Hemoglobin 7.9 g WC count at 11.6, renal function stable Wt down to 201 lb baseline.  Assessment & Plan:  Necrotizing fasciitis of pelvic region and thigh: Patient had emergent operative intervention with incision and debridement, diverting colostomy and had multiple rounds of IV antibiotics (last dose 6/22)-and her wound culture grew E. coli and VRE.  Foley to promote healing.  Seen by surgery, on hydrotherapy Monday Wednesday Friday.  Wound on exam today looks much improved surgery advised hydrotherapy today and likely on Wednesday and follow-up with Dr. Heber Huetter for wound care and continue with WD dressing changes twice daily.  Augment nutrition.  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  Speech following closely remains on full liquid nectar thick diet but oral intake is picking up hopefully swallow will continue to improve  Leukocytosis: Patient is afebrile, white counts ranging from 12-16 K.  Afebrile. Recent Labs  Lab 01/22/20 0439 01/23/20 0441 01/24/20 0420 01/25/20 0452 01/26/20 0428  WBC 10.6* 11.3* 9.8 12.3* 11.6*   Hypokalemia/hypomagnesemia: Stable, stop potassium supplementation as well stopping Lasix  Septic shock secondary to #- resovled.  Blood pressure stable.   Acute hypoxic respiratory failure: On room air.  Resolved.  Severe protein calorie  malnutrition with low albumin, anasarca with poor nutritional status due to severe illness: Continue to augment insulin status with tube feeding, dietitian following.  Continue multivitamins.   Fluid overload/anasarca: Suspecting from fluid resuscitation antibiotics and hypoalbuminemia On admission weight was 193 to 213 pounds, on 7/1:256.1- 260 lb and patient was diuresed with IV Lasix with improved now on oral Lasix, weight has improved to 200 pounds we will stop patient's diuretics.   Pressure injury/DTI of right ear not present on admission.  Continue local supportive care and dressing.  AKI with unknown baseline 1.7 upon admission: Renal function is stable.  Monitor Recent Labs  Lab 01/22/20 0439 01/23/20 0441 01/24/20 0420 01/25/20 0452 01/26/20 0428  BUN 14 13 15 15 15   CREATININE <0.30* 0.31* <0.30* <0.30* <0.30*   Hyperglycemia into fluid:Hyperglycemia on tube feed needing sliding scale coverage, patient does not want to have further sliding scale so will change every 4 sliding scale to every 6 hours, change standing insulin to Lantus 8 units while on tube feeds and wean off as TF is weaned. Recent Labs  Lab 01/25/20 1642 01/25/20 2110 01/26/20 0017 01/26/20 0332 01/26/20 0739  GLUCAP 132* 110* 113* 99 139*   Anemia of critical illness/acute blood loss anemia Baseline hemoglobin 12-13 developed anemia in the setting of infection critical illness bleeding.patient was seen by GI for rectal bleeding underwent sigmoidoscopy flexible  Showed-10 cm Stercoral ulcer 6/28 received 3 units PRBC.  Hemoglobin stable.  Monitor closely.   Recent Labs  Lab 01/22/20 0439 01/23/20 0441 01/24/20 0420 01/25/20 0452 01/26/20 0428  HGB 7.4* 7.7* 7.8* 7.6* 7.9*  HCT 24.5* 25.4* 25.0* 24.6* 25.9*   Morbid obesity with BMI 43.9.  With diuresis weight has improved and BMI is 34 now.  Will benefit with outpatient follow-up healthy lifestyle.   Sinus tachycardia heart rate intermittently  elevated ranging from 100-120s and this has been making her MEWS red   She has had extensive evaluation negative for DVT in lower extremities and upper extremities.  She had CT of the chest negative for PE.  TSH is normal.  Continue metoprolol low-dose and adjust slowly.  Patient reports she does have baseline tachycardia at home.  Erythema generalized: likely from use of Purple Wipe by patient's family and patient's family has been educated on not to do anything without asking patient's nurses.swelling and erythema is much improved   DVT prophylaxis: Place and maintain sequential compression device Start: 01/10/20 1738. Resume lovenox as no more bleeding and she remains at high risk for VTE.  If she rebleeds from the wound will need to stop it Code Status: full Family Communication: plan of care discussed with patient and nursing staffs at bedside.  Status is: Inpatient  Remains inpatient appropriate because:Inpatient level of care appropriate due to severity of illness and For ongoing management wound, postop, multiple issues, poor oral intake remains tachycardic with adjusting metoprolol, and also fluid overload and getting diuresed with Lasix.  Receiving nutrition with poor intake speech following and remain on NG tube feeding.  Dispo: The patient is from: Home              Anticipated d/c is to: Skilled nursing facility.  Remains on NG tube hoping for NG tube to wean off as swallow function improved.  P.o. intake is picking up.  Anticipated d/c date is: > 3 days              Patient currently is medically stable.Continue to provide nutritional support, NG tube, speech following to advance diet.  Continue wound care given her extensive wound.  Continue diverting colostomy in place will need reversal once wound heals.  Foley in place.  She is difficult to place.   Nutrition: Nutrition and speech are following. Diet Order            Diet full liquid Room service appropriate? Yes;  Fluid consistency: Nectar Thick  Diet effective now                 Nutrition Problem: Increased nutrient needs Etiology: chronic illness, wound healing (alcohol abuse; necrotizing fasciitis) Signs/Symptoms: estimated needs Interventions: Tube feeding, Prostat Body mass index is 34.55 kg/m. Pressure Ulcer: Pressure Injury 12/27/19 Ear Right Stage 3 -  Full thickness tissue loss. Subcutaneous fat may be visible but bone, tendon or muscle are NOT exposed. previously noted unstageable wound has evolved into a healing stage 3 when assessed on 7/5 (Active)  12/27/19 1200  Location: Ear  Location Orientation: Right  Staging: Stage 3 -  Full thickness tissue loss. Subcutaneous fat may be visible but bone, tendon or muscle are NOT exposed.  Wound Description (Comments): previously noted unstageable wound has evolved into a healing stage 3 when assessed on 7/5  Present on Admission: No   Consultants:see note  Procedures:  ENDOTRACHEAL INTUBATION (6/8 >> 01/07/2020)  INCISION AND DEBRIDEMENT OF LEFT BUTTOCK AND PERINEUM (12/23/2019)  IRRIGATION AND DEBRIDEMENT OF BUTTOCKS; LAPAROSCOPIC LOOP COLOSTOMY (12/25/2019)  FLEXIBLE SIGMOIDOSCOPY (01/11/2020)   Microbiology:see note  Medications: Scheduled Meds: . ascorbic acid  500 mg Per Tube BID  . Chlorhexidine Gluconate Cloth  6 each Topical Daily  . collagenase   Topical Daily  . ergocalciferol  50,000 Units Per Tube Q Fri  . feeding supplement (PRO-STAT SUGAR FREE 64)  30 mL Per Tube Daily  . ferrous sulfate  300 mg Per Tube BID WC  . folic acid  1 mg Per Tube Daily  . furosemide  40 mg Oral Daily  . insulin aspart  0-6 Units Subcutaneous Q6H  . insulin glargine  8 Units Subcutaneous Daily  . mouth rinse  15 mL Mouth Rinse BID  . melatonin  3 mg Oral QHS  . metoprolol tartrate  12.5 mg Per Tube BID  . multivitamin with minerals  1 tablet Per Tube Daily  . pantoprazole sodium  40 mg Per Tube BID  . potassium chloride  20 mEq Per Tube  BID  . sodium chloride flush  10-40 mL Intracatheter Q12H  . thiamine  100 mg Per Tube Daily  . traZODone  50 mg Oral QHS  . zinc sulfate  220 mg Per Tube Daily   Continuous Infusions: . sodium chloride    . feeding supplement (PIVOT 1.5 CAL) 1,000 mL (01/25/20 0700)    Antimicrobials:  Ceftriaxone  Vancomycin  Clindamycin  Linezolid  Flagyl  Finished all antibiotic therapy. Anti-infectives (From admission, onward)   Start     Dose/Rate Route Frequency Ordered Stop   12/31/19 1600  metroNIDAZOLE (FLAGYL) IVPB 500 mg  Status:  Discontinued        500 mg 100 mL/hr over 60 Minutes Intravenous Every 8 hours 12/31/19 1505 01/06/20 0859   12/28/19 1400  vancomycin (VANCOCIN) IVPB 1000 mg/200 mL premix  Status:  Discontinued        1,000  mg 200 mL/hr over 60 Minutes Intravenous Every 24 hours 12/28/19 1005 12/28/19 1343   12/28/19 1400  linezolid (ZYVOX) IVPB 600 mg        600 mg 300 mL/hr over 60 Minutes Intravenous Every 12 hours 12/28/19 1343 01/06/20 1954   12/26/19 1948  vancomycin variable dose per unstable renal function (pharmacist dosing)  Status:  Discontinued         Does not apply See admin instructions 12/26/19 1948 12/28/19 1005   12/23/19 2000  vancomycin (VANCOCIN) IVPB 1000 mg/200 mL premix  Status:  Discontinued        1,000 mg 200 mL/hr over 60 Minutes Intravenous Every 8 hours 12/23/19 1027 12/26/19 1944   12/23/19 1300  clindamycin (CLEOCIN) IVPB 900 mg  Status:  Discontinued        900 mg 100 mL/hr over 30 Minutes Intravenous Every 8 hours 12/23/19 1205 12/31/19 1505   12/23/19 1200  metroNIDAZOLE (FLAGYL) IVPB 500 mg  Status:  Discontinued        500 mg 100 mL/hr over 60 Minutes Intravenous Every 8 hours 12/23/19 1135 12/23/19 1254   12/23/19 1045  vancomycin (VANCOREADY) IVPB 2000 mg/400 mL        2,000 mg 200 mL/hr over 120 Minutes Intravenous NOW 12/23/19 1018 12/23/19 1441   12/23/19 0300  cefTRIAXone (ROCEPHIN) 2 g in sodium chloride 0.9 % 100 mL  IVPB  Status:  Discontinued        2 g 200 mL/hr over 30 Minutes Intravenous Every 24 hours 12/23/19 0236 01/06/20 0859       Objective: Vitals: Today's Vitals   01/26/20 0019 01/26/20 0326 01/26/20 0424 01/26/20 0500  BP: 109/68 126/80    Pulse: (!) 119 (!) 115    Resp: 20 18    Temp: 98.4 F (36.9 C) 98.8 F (37.1 C)    TempSrc: Oral Oral    SpO2: 98% 100%    Weight:    91.3 kg  Height:      PainSc:   Asleep     Intake/Output Summary (Last 24 hours) at 01/26/2020 0911 Last data filed at 01/26/2020 0331 Gross per 24 hour  Intake 1478.83 ml  Output 2950 ml  Net -1471.17 ml   Filed Weights   01/22/20 0416 01/25/20 0500 01/26/20 0500  Weight: 100.1 kg 93.3 kg 91.3 kg   Weight change: -2 kg   Intake/Output from previous day: 07/11 0701 - 07/12 0700 In: 1478.8 [P.O.:600; NG/GT:878.8] Out: 2950 [Urine:2700; Stool:250] Intake/Output this shift: No intake/output data recorded.  Examination:  General exam: AAOx3, NGT+ , NAD, weak appearing. HEENT:Oral mucosa moist, Ear/Nose WNL grossly, dentition normal. Respiratory system: bilaterally clear,no wheezing or crackles,no use of accessory muscle Cardiovascular system: S1 & S2 +, No JVD,. Gastrointestinal system: Abdomen soft, NT,ND, BS+, LLQ Colostomy + with stool Nervous System:Alert, awake, moving extremities and grossly nonfocal Extremities: Not pitting edema on leg- much improved, distal peripheral pulses palpable.  Skin: No rashes,no icterus. MSK: Normal muscle bulk,tone, power Foley + lue picc+  Last photograph by surgery   Data Reviewed: I have personally reviewed following labs and imaging studies CBC: Recent Labs  Lab 01/22/20 0439 01/23/20 0441 01/24/20 0420 01/25/20 0452 01/26/20 0428  WBC 10.6* 11.3* 9.8 12.3* 11.6*  HGB 7.4* 7.7* 7.8* 7.6* 7.9*  HCT 24.5* 25.4* 25.0* 24.6* 25.9*  MCV 98.0 97.3 96.5 96.5 96.3  PLT 306 386 378 399 834*   Basic Metabolic Panel: Recent Labs  Lab 01/22/20 0439  01/23/20 0441  01/24/20 0420 01/25/20 0452 01/26/20 0428  NA 133* 135 132* 132* 135  K 4.0 4.1 4.0 4.3 3.7  CL 95* 96* 94* 93* 96*  CO2 29 26 28 28 29   GLUCOSE 145* 142* 143* 156* 128*  BUN 14 13 15 15 15   CREATININE <0.30* 0.31* <0.30* <0.30* <0.30*  CALCIUM 8.5* 8.9 8.8* 8.8* 9.1   GFR: CrCl cannot be calculated (This lab value cannot be used to calculate CrCl because it is not a number: <0.30). Liver Function Tests: No results for input(s): AST, ALT, ALKPHOS, BILITOT, PROT, ALBUMIN in the last 168 hours. No results for input(s): LIPASE, AMYLASE in the last 168 hours. No results for input(s): AMMONIA in the last 168 hours. Coagulation Profile: No results for input(s): INR, PROTIME in the last 168 hours. Cardiac Enzymes: No results for input(s): CKTOTAL, CKMB, CKMBINDEX, TROPONINI in the last 168 hours. BNP (last 3 results) No results for input(s): PROBNP in the last 8760 hours. HbA1C: No results for input(s): HGBA1C in the last 72 hours. CBG: Recent Labs  Lab 01/25/20 1642 01/25/20 2110 01/26/20 0017 01/26/20 0332 01/26/20 0739  GLUCAP 132* 110* 113* 99 139*   Lipid Profile: No results for input(s): CHOL, HDL, LDLCALC, TRIG, CHOLHDL, LDLDIRECT in the last 72 hours. Thyroid Function Tests: No results for input(s): TSH, T4TOTAL, FREET4, T3FREE, THYROIDAB in the last 72 hours. Anemia Panel: No results for input(s): VITAMINB12, FOLATE, FERRITIN, TIBC, IRON, RETICCTPCT in the last 72 hours. Sepsis Labs: No results for input(s): PROCALCITON, LATICACIDVEN in the last 168 hours.  No results found for this or any previous visit (from the past 240 hour(s)).    Radiology Studies: No results found.   LOS: 88 days   Antonieta Pert, MD [facility]  01/26/2020, 9:11 AM

## 2020-01-26 NOTE — Progress Notes (Signed)
Inpatient Rehabilitation Admissions Coordinator  I spoke with pt's son, Tray, by phone. He is hopeful that his Mom can go to SNF for a couple of months to manage her wounds and therapy needs before returning home with him as primary caregiver with his Uncle. I will update acute team and TOC and we will sign off at this time.  Danne Baxter, RN, MSN Rehab Admissions Coordinator [phone] 01/26/2020 11:30 AM

## 2020-01-26 NOTE — Progress Notes (Signed)
15 Days Post-Op  Subjective: Patient doing well with no complaints today.  Turns well on her own.  PANDA tube in for feedings still  ROS: See above, otherwise other systems negative  Objective: Vital signs in last 24 hours: Temp:  [97.8 F (36.6 C)-98.8 F (37.1 C)] 97.8 F (36.6 C) (07/12 0945) Pulse Rate:  [111-119] 111 (07/12 0945) Resp:  [18-22] 22 (07/12 0945) BP: (107-126)/(66-80) 117/72 (07/12 0945) SpO2:  [98 %-100 %] 100 % (07/12 0945) Weight:  [91.3 kg] 91.3 kg (07/12 0500) Last BM Date: 01/26/20  Intake/Output from previous day: 07/11 0701 - 07/12 0700 In: 1478.8 [P.O.:600; NG/GT:878.8] Out: 2950 [Urine:2700; Stool:250] Intake/Output this shift: Total I/O In: 1030 [P.O.:480; NG/GT:550] Out: 100 [Stool:100]  PE: GUY:QIHK, NT, colostomy working well with gas and stool present Buttock: See picture below - overall very clean except 2-3 oclock with some fibrinous exudate present     Lab Results:  Recent Labs    01/25/20 0452 01/26/20 0428  WBC 12.3* 11.6*  HGB 7.6* 7.9*  HCT 24.6* 25.9*  PLT 399 429*   BMET Recent Labs    01/25/20 0452 01/26/20 0428  NA 132* 135  K 4.3 3.7  CL 93* 96*  CO2 28 29  GLUCOSE 156* 128*  BUN 15 15  CREATININE <0.30* <0.30*  CALCIUM 8.8* 9.1   PT/INR No results for input(s): LABPROT, INR in the last 72 hours. CMP     Component Value Date/Time   NA 135 01/26/2020 0428   K 3.7 01/26/2020 0428   CL 96 (L) 01/26/2020 0428   CO2 29 01/26/2020 0428   GLUCOSE 128 (H) 01/26/2020 0428   BUN 15 01/26/2020 0428   CREATININE <0.30 (L) 01/26/2020 0428   CALCIUM 9.1 01/26/2020 0428   PROT 5.2 (L) 01/17/2020 0242   ALBUMIN 1.3 (L) 01/17/2020 0242   AST 27 01/17/2020 0242   ALT 14 01/17/2020 0242   ALKPHOS 96 01/17/2020 0242   BILITOT 0.5 01/17/2020 0242   GFRNONAA NOT CALCULATED 01/26/2020 0428   GFRAA NOT CALCULATED 01/26/2020 0428   Lipase  No results found for: LIPASE     Studies/Results: No results  found.  Anti-infectives: Anti-infectives (From admission, onward)   Start     Dose/Rate Route Frequency Ordered Stop   12/31/19 1600  metroNIDAZOLE (FLAGYL) IVPB 500 mg  Status:  Discontinued        500 mg 100 mL/hr over 60 Minutes Intravenous Every 8 hours 12/31/19 1505 01/06/20 0859   12/28/19 1400  vancomycin (VANCOCIN) IVPB 1000 mg/200 mL premix  Status:  Discontinued        1,000 mg 200 mL/hr over 60 Minutes Intravenous Every 24 hours 12/28/19 1005 12/28/19 1343   12/28/19 1400  linezolid (ZYVOX) IVPB 600 mg        600 mg 300 mL/hr over 60 Minutes Intravenous Every 12 hours 12/28/19 1343 01/06/20 1954   12/26/19 1948  vancomycin variable dose per unstable renal function (pharmacist dosing)  Status:  Discontinued         Does not apply See admin instructions 12/26/19 1948 12/28/19 1005   12/23/19 2000  vancomycin (VANCOCIN) IVPB 1000 mg/200 mL premix  Status:  Discontinued        1,000 mg 200 mL/hr over 60 Minutes Intravenous Every 8 hours 12/23/19 1027 12/26/19 1944   12/23/19 1300  clindamycin (CLEOCIN) IVPB 900 mg  Status:  Discontinued        900 mg 100 mL/hr over 30 Minutes  Intravenous Every 8 hours 12/23/19 1205 12/31/19 1505   12/23/19 1200  metroNIDAZOLE (FLAGYL) IVPB 500 mg  Status:  Discontinued        500 mg 100 mL/hr over 60 Minutes Intravenous Every 8 hours 12/23/19 1135 12/23/19 1254   12/23/19 1045  vancomycin (VANCOREADY) IVPB 2000 mg/400 mL        2,000 mg 200 mL/hr over 120 Minutes Intravenous NOW 12/23/19 1018 12/23/19 1441   12/23/19 0300  cefTRIAXone (ROCEPHIN) 2 g in sodium chloride 0.9 % 100 mL IVPB  Status:  Discontinued        2 g 200 mL/hr over 30 Minutes Intravenous Every 24 hours 12/23/19 0236 01/06/20 0859       Assessment/Plan Acute hypoxic respiratory failure -now on nasal cannula Hepatic encephalopathy Alcohol abuse Thrombocytopenia  HTN Anemia of critical illness and ABL anemia - had some bleeding from wound and rectum over the weekend,  s/p GI scope which showed a rectal ulcer, bleeding seems to have stopped now  Morbid obesity - BMI 43.90  Necrotizing fasciitis of left buttock -S/Pincision and debridement of skin, subcutaneous tissue, fascia and muscle 15x15x6cm, left buttock5/8 Dr. 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 Redmond Pulling - Ostomy functioning. WOCN following -Continue hydrotherapy today and likely Wednesday.  After this can cease so she can go to SNF  -otherwise continue WD dressing changes BID for now -ambulatory referral has already been made to Dr. Heber [place] for wound care at discharge  -she will need to follow up with Dr. Redmond Pulling after her wound heals to discuss colostomy reversal -no further general surgery needs.  We will sign off at this time.  Call as needed  FEN: IV fluids/full liquids/PANDA ID:  None DVT:SCD/heparin held for rectal bleeding Follow up:  Colostomy reversal >>Dr. Redmond Pulling after wound completely heals Ambulatory referral made for Dr. Heber  for wound care following discharge.   LOS: 36 days    Henreitta Cea , [facility] Surgery 01/26/2020, 1:04 PM Please see Amion for pager number during day hours 7:00am-4:30pm or 7:00am -11:30am on weekends

## 2020-01-26 NOTE — Progress Notes (Signed)
Physical Therapy Treatment Patient Details Name: Alyssa Carey MRN: [id number] DOB: 1971-02-02 Today's Date: 01/26/2020    History of Present Illness 49 y/o female with a heavy alcohol history admitted6/8/21  in the setting of confusion and presumed alcohol withdrawal who later developed sepsis from a gas forming wound, S/P debridement left gluteal wound for necratizing fascitis.VDRF,. Extubated 01/07/20., negative for DVT in bilateral legs and PE. Negative DVT Left and right UE    PT Comments    Pt progressing toward goals. Remains pleasant, agreeable and motivated.  Able to perform repeated sit to stands today from chair to stedy. Activity tolerance and strength improving. Feel pt is ready to move off of tilt bed to regular bed with low air loss mattress  at this time. Continue PT POC SpO2 97-100% on RA HR 90s to max of 135 with standing/activity   Follow Up Recommendations  SNF     Equipment Recommendations  None recommended by PT    Recommendations for Other Services       Precautions / Restrictions Precautions Precautions: Fall Precaution Comments: L gluteal wound, colostomy, PEG tube    Mobility  Bed Mobility Overal bed mobility: Needs Assistance Bed Mobility: Rolling Rolling: Min assist;Min guard         General bed mobility comments: deferred EOB while pt on tilt bed; used maxi-sky for bed to chair (standing from chair)  Transfers Overall transfer level: Needs assistance Equipment used: Ambulation equipment used Transfers: Sit to/from Stand Sit to Stand: Min assist;+2 safety/equipment;+2 physical assistance         General transfer comment: repeated x4 sit to stand and partial sit to stands for strengthening and endurance; stood in stedy ~ 30 sec, 15 sec   Ambulation/Gait                 Stairs             Wheelchair Mobility    Modified Rankin (Stroke Patients Only)       Balance                                             Cognition Arousal/Alertness: Awake/alert Behavior During Therapy: WFL for tasks assessed/performed   Area of Impairment: Awareness;Safety/judgement                   Current Attention Level: Alternating   Following Commands: Follows multi-step commands with increased time;Follows one step commands consistently       General Comments: voice stronger, pt vocalizes that she wants to do everything she can to get better       Exercises General Exercises - Lower Extremity Ankle Circles/Pumps: AROM;10 reps;Both;Seated Long Arc Quad: AROM;10 reps;Both    General Comments        Pertinent Vitals/Pain Pain Assessment: Faces Faces Pain Scale: No hurt    Home Living                      Prior Function            PT Goals (current goals can now be found in the care plan section) Acute Rehab PT Goals Patient Stated Goal: To stand PT Goal Formulation: With patient Time For Goal Achievement: 02/03/20 Potential to Achieve Goals: Good Progress towards PT goals: Progressing toward goals    Frequency    Min 2X/week  PT Plan Current plan remains appropriate    Co-evaluation              AM-PAC PT "6 Clicks" Mobility   Outcome Measure  Help needed turning from your back to your side while in a flat bed without using bedrails?: A Little Help needed moving from lying on your back to sitting on the side of a flat bed without using bedrails?: A Little Help needed moving to and from a bed to a chair (including a wheelchair)?: A Little Help needed standing up from a chair using your arms (e.g., wheelchair or bedside chair)?: A Lot Help needed to walk in hospital room?: A Lot Help needed climbing 3-5 steps with a railing? : A Lot 6 Click Score: 15    End of Session Equipment Utilized During Treatment: Gait belt Activity Tolerance: Patient tolerated treatment well Patient left: in chair;with call bell/phone within reach;with  family/visitor present Nurse Communication: Mobility status;Need for lift equipment PT Visit Diagnosis: Muscle weakness (generalized) (M62.81);Difficulty in walking, not elsewhere classified (R26.2)     Time: 2878-6767 PT Time Calculation (min) (ACUTE ONLY): 30 min  Charges:  $Therapeutic Activity: 23-37 mins                     Baxter Flattery, PT  Acute Rehab Dept ([facility]) [phone] Pager [phone]  01/26/2020    [facility] 01/26/2020, 2:23 PM

## 2020-01-26 NOTE — TOC Progression Note (Signed)
Transition of Care [facility]) - Progression Note    Patient Details  Name: Alyssa Carey MRN: [id number] Date of Birth: Nov 19, 1970  Transition of Care [facility]) CM/SW Contact  Eriyana Sweeten, Juliann Pulse, RN Phone Number: 01/26/2020, 2:13 PM  Clinical Narrative:  Not appropriate for CIR;Patient agrees to SNF-Faxed out to SNF-no bed offers currently-difficult to place list.Awaiting bed offers. Animator to f/u on [employer].Weaning TF-goal- nectar thick diet, surgery following with wound care-goal w-d dsg changes.    Expected Discharge Plan: [place] Barriers to Discharge: Continued Medical Work up  Expected Discharge Plan and Services Expected Discharge Plan: [place] In-house Referral: Development worker, community Discharge Planning Services: CM Consult Post Acute Care Choice: [place] Living arrangements for the past 2 months: Single Family Home                                       Social Determinants of Health (SDOH) Interventions    Readmission Risk Interventions No flowsheet data found.

## 2020-01-26 NOTE — Care Management (Signed)
Transition of Care (TOC) -30 day Note       Patient Details  Name: [name] SPJ:[id number] Date of Birth:1970-08-06   Transition of Care [facility]) CM/SW Contact  Name:Quinnten Calvin [place] Phone Number:[phone] Date:01/26/2020 Time:1:21p   MUST ID: [id number]   To Whom it May Concern:   Please be advised that the above patient will require a short-term nursing home stay, anticipated 30 days or less rehabilitation and strengthening. The plan is for return home.

## 2020-01-26 NOTE — Consult Note (Signed)
[place] Nurse Consult Note: Reason for Consult: Stage 3 (healing) pressure injury to right ear. Patient complaining that ear "itches" and she is currently rubbing it. Lawyer for lotion. Wound type: Pressure Pressure Injury POA: No Measurement: 0.6c, x 0.3cm x 0.1cm Wound bed: pink, dry Drainage (amount, consistency, odor) none Periwound: peeling Dressing procedure/placement/frequency: I have added Aquaphor ointment three times daily to this area for moisturizing and reepithelialization.   [place] Nurse ostomy follow up Stoma type/location: LLQ colostomy (diverting). Colostomy leakage over weekend Stomal assessment/size: 1 inch x 1 and 1/4 inch oval in a gulley Peristomal assessment: with scattered partial thickness skin breakdown.  Scant serous exudate. Treatment options for stomal/peristomal skin: dusted with stoma powder, brushed away excess, sealed with skin barrier wipe ([place]av[place]). Placement of skin barrier ring Output: thin light brown effluent in a large amount. Note: Patient is still on tube feedings. Ostomy pouching: 2pc. 2 and 1/4 inch pouching system with skin barrier ring Education provided: None today. Patient is agitated about numerous pouch changes over weekend. Enrolled patient in [place] Start Discharge program: No   WOC nursing team will follow, and will remain available to this patient, the nursing and medical teams.   Thanks, Maudie Flakes, MSN, RN, [place], Arther Abbott  Pager# [phone]

## 2020-01-27 LAB — BASIC METABOLIC PANEL
Anion gap: 10 (ref 5–15)
BUN: 16 mg/dL (ref 6–20)
CO2: 28 mmol/L (ref 22–32)
Calcium: 9.2 mg/dL (ref 8.9–10.3)
Chloride: 95 mmol/L — ABNORMAL LOW (ref 98–111)
Creatinine, Ser: <0.3 mg/dL — ABNORMAL LOW (ref 0.44–1.00)
Glucose, Bld: 126 mg/dL — ABNORMAL HIGH (ref 70–99)
Potassium: 4.4 mmol/L (ref 3.5–5.1)
Sodium: 133 mmol/L — ABNORMAL LOW (ref 135–145)

## 2020-01-27 LAB — CBC
HCT: 24.7 % — ABNORMAL LOW (ref 36.0–46.0)
Hemoglobin: 7.7 g/dL — ABNORMAL LOW (ref 12.0–15.0)
MCH: 29.8 pg (ref 26.0–34.0)
MCHC: 31.2 g/dL (ref 30.0–36.0)
MCV: 95.7 fL (ref 80.0–100.0)
Platelets: 455 10*3/uL — ABNORMAL HIGH (ref 150–400)
RBC: 2.58 MIL/uL — ABNORMAL LOW (ref 3.87–5.11)
RDW: 19.8 % — ABNORMAL HIGH (ref 11.5–15.5)
WBC: 10 10*3/uL (ref 4.0–10.5)
nRBC: 0 % (ref 0.0–0.2)

## 2020-01-27 LAB — GLUCOSE, CAPILLARY
Glucose-Capillary: 118 mg/dL — ABNORMAL HIGH (ref 70–99)
Glucose-Capillary: 109 mg/dL — ABNORMAL HIGH (ref 70–99)
Glucose-Capillary: 98 mg/dL (ref 70–99)

## 2020-01-27 MED ORDER — JUVEN PO PACK
1.0000 | PACK | Freq: Two times a day (BID) | ORAL | Status: DC
  Administered 2020-01-27 – 2020-02-03 (×11): 1 via ORAL
  Filled 2020-01-27 (×15): qty 1

## 2020-01-27 MED ORDER — TRAMADOL HCL 50 MG PO TABS
50.0000 mg | ORAL_TABLET | Freq: Four times a day (QID) | ORAL | Status: DC | PRN
  Administered 2020-01-28 – 2020-02-02 (×15): 50 mg via ORAL
  Filled 2020-01-27 (×15): qty 1

## 2020-01-27 MED ORDER — PRO-STAT SUGAR FREE PO LIQD
30.0000 mL | Freq: Two times a day (BID) | ORAL | Status: DC
  Administered 2020-01-27 – 2020-02-02 (×11): 30 mL via ORAL
  Filled 2020-01-27 (×21): qty 30

## 2020-01-27 MED ORDER — KATE FARMS STANDARD 1.4 PO LIQD
325.0000 mL | Freq: Two times a day (BID) | ORAL | Status: DC
  Administered 2020-01-27 – 2020-01-31 (×3): 325 mL via ORAL
  Filled 2020-01-27 (×14): qty 325

## 2020-01-27 NOTE — Plan of Care (Signed)

## 2020-01-27 NOTE — Progress Notes (Signed)
  Speech Language Pathology Treatment:    Patient Details Name: Alyssa Carey MRN: [id number] DOB: [birth year] Today's Date: 01/27/2020 Time: 2248-2500 SLP Time Calculation (min) (ACUTE ONLY): 30 min  Assessment / Plan / Recommendation Clinical Impression  Pt today able to pass 3 ounce Yale water test without difficulties thus revealing risk for silent aspiration is low, despite her silent aspiration on the prior MBS.  Today pt able to masticate solids, consume puree and liquids in a timely fashion.  She reports her swallow ability to be improved and it is markedly improved from SlP observation.  Recommend to advance diet to dys3/thin with precautions.  Recommend remove feeding tube as pt advises her intake will be adequate and her swallow function allows adequacy of po.  Will follow up to assure tolerance of po diet and RMST to continue to help with strength of cough/airway protection for po.  Pt remains dysphonic but is able to verbalize more than a whisper currently.  Educated pt to plan/recommendations using teach back.  Marland Kitchen    HPI HPI: 49 year old with alcohol abuse, alcohol withdrawal, necrotizing fasciitis of left buttock status post debridement. Acute respiratory failure secondary to septic shock. Intubated from 6/8 to 6/23.   Pt underwent MBS on Saturday 01/09/2020 with findings of infrequent and trace, silent aspiration of nectar and thin.  Recommended to start diet of dys1/small, controlled sips of nectar.  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  ENT advised pt high aspiration risk.  Follow up indicated.      SLP Plan  Continue with current plan of care       Recommendations  Diet recommendations: Dysphagia 3  (mechanical soft);Thin liquid Liquids provided via: Cup;Straw Medication Administration: Whole meds with puree Supervision: Patient able to self feed;Intermittent supervision to cue for compensatory strategies Compensations: Slow rate;Small sips/bites;Effortful swallow;Clear throat intermittently Postural Changes and/or Swallow Maneuvers: Seated upright 90 degrees                Oral Care Recommendations: Oral care QID Follow up Recommendations: Other (comment) SLP Visit Diagnosis: Dysphagia, oropharyngeal phase (R13.12) Plan: Continue with current plan of care       [place], Halle Davlin Ann 01/27/2020, 12:24 PM  Kathleen Lime, MS [facility] SLP [place] Office [phone]

## 2020-01-27 NOTE — Progress Notes (Signed)
Nutrition Follow-up  DOCUMENTATION CODES:   Obesity unspecified  INTERVENTION:  - will d/c TF this afternoon. - will order Anda [employer] BID, each supplement provides 455 kcal and 20 grams protein. - will order 30 ml Prousource Plus BID, each supplement provides 100 kcal and 15 grams protein. - will order 1 packet of Juven BID, each packet provides 95 calories, 2.5 grams of protein (collagen), and 9.8 grams of carbohydrate (3 grams sugar); also contains 7 grams of L-arginine and L-glutamine, 300 mg vitamin C, 15 mg vitamin E, 1.2 mcg vitamin B-12, 9.5 mg zinc, 200 mg calcium, and 1.5 g  Calcium Beta-hydroxy-Beta-methylbutyrate to support wound healing.   NUTRITION DIAGNOSIS:   Increased nutrient needs related to chronic illness, wound healing (alcohol abuse; necrotizing fasciitis) as evidenced by estimated needs. -ongoing  GOAL:   Patient will meet greater than or equal to 90% of their needs -currently met by TF regimen  MONITOR:   PO intake, Supplement acceptance, Labs, Weight trends, Skin  ASSESSMENT:   Pt with PMH of heavy alcohol history who for one week with decreased intake admitted 6/6 with alcoholic hepatitis with hepatic encephalopathy and AKI.  Significant Events: 6/7-admission; initial RD assessment 6/8-Rapid Response; CT pelvis to r/o necrotizing fascitis--found to be positive for necrotizing fascitis and went to OR for I&D 6/9-remained intubated with OGT in place; started TF; added ascorbic acid BID and zinc once/day.  6/10-return to OR for I&D; lap loop colostomy 6/16-vomiting overnight; TF held and OGT to LIS 6/17-vomiting overnight; OGT removed d/t aspiration event and being partly pulled; small bore NGT placed 6/18-restarted TF at trickle rate 6/21- small bore NGT removed; OGT placed 6/23- extubated; OGT removed; MBS completed and diet advanced to Dysphagia 1, nectar-thick 6/27- s/p flex sig 6/28- NGT placed in R nare; diet changed to NPO 6/30- diet  advanced to FLD, nectar-thick 7/8- transferred from SDU to Progressive care 7/12- Surgery signed off   Plan is for removal of NGT today. Diet was advanced to Dysphagia 3, thin liquids today at 1150.   Patient was laying in bed with NGT still in place and TF infusing. She is aware of plan for removal and sole source of nutrition to be taken in orally. Patient is able to verbalize understanding of this, of need for adequate nutrition, and to focus on protein to aid in wound healing. Briefly discussed oral nutrition supplements and the rationale for these; patient verbalizes understanding.   Weight trended down from 7/8-7/11 and has remained stable since 7/11. Flow sheet documentation indicates mild edema to all extremities.     Labs reviewed; CBGs: 118 and 109 mg/dl, Na: 133 mmol/l, Cl: 95 mmol/l, creatinine: <0.3 mg/dl. Medications reviewed; 500 mg ascorbic acid BID, 09735 units drisdol every Friday, 300 mg ferrous sulfate BID, 1 mg folvite/day, sliding scale novolog, 3 mg melatonin, 1 tablet multivitamin with minerals/day, 40 mg protonix BID, 20 mEq KCl BID, 100 mg thiamine/day, 220 mg zinc sulfate/day.    Diet Order:   Diet Order            DIET DYS 3 Room service appropriate? Yes; Fluid consistency: Thin  Diet effective now                 EDUCATION NEEDS:   Education needs have been addressed  Skin:  Skin Assessment:  (necrotizing fasciitis to L buttock) Skin Integrity Issues:: Stage III, Other (Comment), Incisions DTI: R ear Stage II: NA Stage III: R ear Unstageable: NA Incisions: L buttocks, abdomen Other:  non-pressure wound abdomen and buttocks  Last BM:  7/13 (175 ml via colostomy)  Height:   Ht Readings from Last 1 Encounters:  01/05/20 _0  (1.626 m)    Weight:   Wt Readings from Last 1 Encounters:  01/26/20 91.3 kg     Estimated Nutritional Needs:  Kcal:  2500-2700 kcal Protein:  160-175 grams Fluid:  >/= 2.3 L/day     Jarome Matin, MS, RD,  LDN, CNSC Inpatient Clinical Dietitian RD pager # available in AMION  After hours/weekend pager # available in [facility]

## 2020-01-27 NOTE — TOC Progression Note (Signed)
Transition of Care [facility]) - Progression Note    Patient Details  Name: Alyssa Carey MRN: [id number] Date of Birth: 03-10-1971  [facility]) CM/SW Contact  Yemaya Barnier, Juliann Pulse, RN Phone Number: 01/27/2020, 3:36 PM  Clinical Narrative:Refferal for SNF faxed to [employer] [facility] [place] for review-LOG-await response, & pasrr.       Expected Discharge Plan: [place] Barriers to Discharge: Continued Medical Work up  Expected Discharge Plan and Services Expected Discharge Plan: [place] In-house Referral: Development worker, community Discharge Planning Services: CM Consult Post Acute Care Choice: [place] Living arrangements for the past 2 months: Single Family Home                                       Social Determinants of Health (SDOH) Interventions    Readmission Risk Interventions No flowsheet data found.

## 2020-01-27 NOTE — Progress Notes (Signed)
PROGRESS NOTE    Alyssa Carey  HQI:[id number] DOB: 15-Sep-1970 DOA: 12/21/2019 PCP: Patient, No Pcp Per   Chef Complaints:: Alcohol withdrawal   Brief Narrative:49 year old female who presented secondary to concern for alcohol withdrawal and quickly developed evidence of necrotizing fasciitis, emergently managed in the OR with critical care, general surgery with I&D.  Patient was in ICU needing intubation and vasopressors for septic shock underwent repeat I&D and diverting colostomy.  She has been extubated and off vasopressors. remains in the stepdown unit.  Wound is slowly healing being managed by surgery wound care and had been on hydrotherapy Monday Wednesday Friday.  Followed by speech therapy oral intake inadequate on modified diet and on NG tube feeding. Patient is slowly improving. She had evidence of fluid overload with weight increased up to 260 pound from admission weight of 193-212 lb-placed on IV Lasix with good output and improving weight and switched to oral Lasix, wt down to 220 .   Significant events 6/06 admission 6/08 ICU admission to OR ofr I&D, back intubated and on pressors, 6/09 started tube feeds. Still pressor dependent,kept on vent w/ anticipated return to OR planned  6/10 Back to the OR for significant debridement and also placement of diverting colostomy 6/11 Sedated, low-dose phenylephrine. CCS felt wound looking good. Sedation changed to precedex / PRN fent  6/12 more awake, on 15/5 pressure support 6/13 family discussion >> DNR, continue medical care 6//15 transfuse 1 unit PRBC; vomiting, tube feeds held 6/16 Vomiting overnight 6/17 Trial PSV 15/5 with variable Vt, remains on vasopressors. Vomiting overnight  6/18 transfuse 1 unit PRBC 6/20 Off pressors, On precedex and weaning on PSV  6/21 Weaning on PSV, planned hydrotherapy 6/23 Extubated 6/24 Transferred to [facility] service. 01/22/20- Tx out of SDU Patient had evidence of fluid overload with weight gain of  2 to 60 pounds from baseline around 200s diarrhea.  IV Lasix with a significantly improved , lasix stopped 7/12. 7/12: Surgery on eval satisfied with her wound healing and will continue twice daily WD dressing and signed off. 7/13-doing well with this.  Starting on dysphagia 3 thin liquid diet and NG tube discontinued  Subjective:  Patient feels great, waiting for a speech eval today after speech eval recommendation was on dysphagia 3 diet and removal of NG tube.  No new complaints speech clear.   BP at times soft, heart rate tachycardic intermittently, hemoglobin marginal. Wt down to 201 lb baseline.  Assessment & Plan:  Necrotizing fasciitis of pelvic region and thigh: Patient had emergent operative intervention with incision and debridement, diverting colostomy and had multiple rounds of IV antibiotics (last dose 6/22)-and her wound culture grew E. coli and VRE.  Foley to promote healing.  Seen by surgery, on hydrotherapy Monday Wednesday Friday.  On exam on 7/12 looks significantly improved advised hydrotherapy possibly 1 more on Wednesday and follow-up with Dr. 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  Speech eval completed today did remarkably well and placing on a dysphagia 3/ THIN diet and will discontinue NG tube feeding.  Leukocytosis:Leukocytosis resolved.   Recent Labs  Lab 01/23/20 0441 01/24/20 0420 01/25/20 0452 01/26/20 0428 01/27/20 0426  WBC  11.3* 9.8 12.3* 11.6* 10.0   Hypokalemia/hypomagnesemia: Electrolytes are stable.   Septic shock secondary to #- resovled.  Blood pressure stable.   Acute hypoxic respiratory failure:  On room air.  Resolved.  Severe protein calorie malnutrition with low albumin, anasarca with poor nutritional status due to severe illness: Doing well with speech therapy.  NGT feeding continue on oral intake.  Continue nutrition appreciate dietitian input.  Fluid overload/anasarca: Suspecting from fluid resuscitation antibiotics and hypoalbuminemia On admission weight was 193 to 213 pounds, on 7/1:256.1- 260 lb and patient was diuresed with IV Lasix with improved.  Patient is on oral Lasix and discontinued 7/12 after her weight is down to 200 pounds  Pressure injury/DTI of right ear not present on admission.  Continue local supportive care and dressing.  AKI with unknown baseline 1.7 upon admission: Renal function has stabilized. Recent Labs  Lab 01/23/20 0441 01/24/20 0420 01/25/20 0452 01/26/20 0428 01/27/20 0426  BUN 13 15 15 15 16   CREATININE 0.31* <0.30* <0.30* <0.30* <0.30*   Hyperglycemia into fluid: We will discontinue her Lantus after discontinuing defeat continue on sliding scale and hopefully we can take her off that too.  Marland Kitchen Recent Labs  Lab 01/26/20 0739 01/26/20 1344 01/26/20 1738 01/26/20 2252 01/27/20 0642  GLUCAP 139* 88 112* 134* 118*   Anemia of critical illness/acute blood loss anemia Baseline hemoglobin 12-13 developed anemia in the setting of infection critical illness bleeding.patient was seen by GI for rectal bleeding underwent sigmoidoscopy flexible  Showed-10 cm Stercoral ulcer 6/28 received 3 units PRBC.  Hemoglobin 7.7 g.  Continue to monitor closely.    Recent Labs  Lab 01/23/20 0441 01/24/20 0420 01/25/20 0452 01/26/20 0428 01/27/20 0426  HGB 7.7* 7.8* 7.6* 7.9* 7.7*  HCT 25.4* 25.0* 24.6* 25.9* 24.7*   Morbid obesity with BMI 43.9.  With diuresis weight has improved and BMI is 34 now.  Will benefit with outpatient follow-up healthy lifestyle.   Sinus tachycardia heart rate intermittently elevated ranging from 100-120s and this has been making  her MEWS red   She has had extensive evaluation negative for DVT in lower extremities and upper extremities.  She had CT of the chest negative for PE.  TSH is normal.  Continue metoprolol low-dose and adjust slowly.  Patient reports she does have baseline tachycardia at home.  Erythema generalized: likely from use of Purple Wipe by patient's family and patient's family has been educated on not to do anything without asking patient's nurses.swelling and erythema is much improved   DVT prophylaxis: enoxaparin (LOVENOX) injection 40 mg Start: 01/26/20 1600 Place and maintain sequential compression device Start: 01/10/20 1738. Resume lovenox as no more bleeding and she remains at high risk for VTE.  If she rebleeds from the wound will need to stop it Code Status: full Family Communication: plan of care discussed with patient and nursing staffs at bedside.  Status is: Inpatient  Remains inpatient appropriate because:Inpatient level of care appropriate due to severity of illness and For ongoing management wound, postop, multiple issues, poor oral intake remains tachycardic with adjusting metoprolol, and also fluid overload and getting diuresed with Lasix.  Receiving nutrition with poor intake speech following and remain on NG tube feeding.  Dispo: The patient is from: Home              Anticipated d/c is to: Skilled nursing facility.  NG tube being removed today.              Anticipated d/c date  is: Once bed available              Patient currently is medically stable.she is awaiting for placement to skilled nursing facility.    Nutrition: Nutrition and speech are following. Diet Order            Diet full liquid Room service appropriate? Yes; Fluid consistency: Nectar Thick  Diet effective now                 Nutrition Problem: Increased nutrient needs Etiology: chronic illness, wound healing (alcohol abuse; necrotizing fasciitis) Signs/Symptoms: estimated needs Interventions: Tube feeding,  Prostat Body mass index is 34.55 kg/m. Pressure Ulcer: Pressure Injury 12/27/19 Ear Right Stage 3 -  Full thickness tissue loss. Subcutaneous fat may be visible but bone, tendon or muscle are NOT exposed. previously noted unstageable wound has evolved into a healing stage 3 when assessed on 7/5 (Active)  12/27/19 1200  Location: Ear  Location Orientation: Right  Staging: Stage 3 -  Full thickness tissue loss. Subcutaneous fat may be visible but bone, tendon or muscle are NOT exposed.  Wound Description (Comments): previously noted unstageable wound has evolved into a healing stage 3 when assessed on 7/5  Present on Admission: No   Consultants:see note  Procedures:  ENDOTRACHEAL INTUBATION (6/8 >> 01/07/2020)  INCISION AND DEBRIDEMENT OF LEFT BUTTOCK AND PERINEUM (12/23/2019)  IRRIGATION AND DEBRIDEMENT OF BUTTOCKS; LAPAROSCOPIC LOOP COLOSTOMY (12/25/2019)  FLEXIBLE SIGMOIDOSCOPY (01/11/2020)   Microbiology:see note  Medications: Scheduled Meds: . ascorbic acid  500 mg Per Tube BID  . Chlorhexidine Gluconate Cloth  6 each Topical Daily  . collagenase   Topical Daily  . enoxaparin (LOVENOX) injection  40 mg Subcutaneous Q24H  . ergocalciferol  50,000 Units Per Tube Q Fri  . feeding supplement (PRO-STAT SUGAR FREE 64)  30 mL Per Tube Daily  . ferrous sulfate  300 mg Per Tube BID WC  . folic acid  1 mg Per Tube Daily  . insulin aspart  0-6 Units Subcutaneous Q6H  . insulin glargine  8 Units Subcutaneous Daily  . mouth rinse  15 mL Mouth Rinse BID  . melatonin  3 mg Oral QHS  . metoprolol tartrate  12.5 mg Per Tube BID  . mineral oil-hydrophilic petrolatum   Topical TID  . multivitamin with minerals  1 tablet Per Tube Daily  . pantoprazole sodium  40 mg Per Tube BID  . potassium chloride  20 mEq Per Tube BID  . sodium chloride flush  10-40 mL Intracatheter Q12H  . thiamine  100 mg Per Tube Daily  . traZODone  50 mg Oral QHS  . zinc sulfate  220 mg Per Tube Daily   Continuous  Infusions: . sodium chloride    . feeding supplement (PIVOT 1.5 CAL) 1,000 mL (01/26/20 1837)    Antimicrobials:  Ceftriaxone  Vancomycin  Clindamycin  Linezolid  Flagyl  Finished all antibiotic therapy. Anti-infectives (From admission, onward)   Start     Dose/Rate Route Frequency Ordered Stop   12/31/19 1600  metroNIDAZOLE (FLAGYL) IVPB 500 mg  Status:  Discontinued        500 mg 100 mL/hr over 60 Minutes Intravenous Every 8 hours 12/31/19 1505 01/06/20 0859   12/28/19 1400  vancomycin (VANCOCIN) IVPB 1000 mg/200 mL premix  Status:  Discontinued        1,000 mg 200 mL/hr over 60 Minutes Intravenous Every 24 hours 12/28/19 1005 12/28/19 1343   12/28/19 1400  linezolid (ZYVOX) IVPB 600 mg  600 mg 300 mL/hr over 60 Minutes Intravenous Every 12 hours 12/28/19 1343 01/06/20 1954   12/26/19 1948  vancomycin variable dose per unstable renal function (pharmacist dosing)  Status:  Discontinued         Does not apply See admin instructions 12/26/19 1948 12/28/19 1005   12/23/19 2000  vancomycin (VANCOCIN) IVPB 1000 mg/200 mL premix  Status:  Discontinued        1,000 mg 200 mL/hr over 60 Minutes Intravenous Every 8 hours 12/23/19 1027 12/26/19 1944   12/23/19 1300  clindamycin (CLEOCIN) IVPB 900 mg  Status:  Discontinued        900 mg 100 mL/hr over 30 Minutes Intravenous Every 8 hours 12/23/19 1205 12/31/19 1505   12/23/19 1200  metroNIDAZOLE (FLAGYL) IVPB 500 mg  Status:  Discontinued        500 mg 100 mL/hr over 60 Minutes Intravenous Every 8 hours 12/23/19 1135 12/23/19 1254   12/23/19 1045  vancomycin (VANCOREADY) IVPB 2000 mg/400 mL        2,000 mg 200 mL/hr over 120 Minutes Intravenous NOW 12/23/19 1018 12/23/19 1441   12/23/19 0300  cefTRIAXone (ROCEPHIN) 2 g in sodium chloride 0.9 % 100 mL IVPB  Status:  Discontinued        2 g 200 mL/hr over 30 Minutes Intravenous Every 24 hours 12/23/19 0236 01/06/20 0859       Objective: Vitals: Today's Vitals   01/26/20  2251 01/27/20 0204 01/27/20 0619 01/27/20 0934  BP: 96/60 98/68 117/75 120/82  Pulse: (!) 103 (!) 110 (!) 120 (!) 106  Resp:  20 20 16   Temp:  98.4 F (36.9 C) 97.8 F (36.6 C) 98.4 F (36.9 C)  TempSrc:  Oral Oral Oral  SpO2: 99% 97% 100% 98%  Weight:      Height:      PainSc:        Intake/Output Summary (Last 24 hours) at 01/27/2020 1145 Last data filed at 01/27/2020 0900 Gross per 24 hour  Intake 2410 ml  Output 3775 ml  Net -1365 ml   Filed Weights   01/22/20 0416 01/25/20 0500 01/26/20 0500  Weight: 100.1 kg 93.3 kg 91.3 kg   Weight change:    Intake/Output from previous day: 07/12 0701 - 07/13 0700 In: 2530 [P.O.:720; NG/GT:1810] Out: 8088 [Urine:3550; Stool:325] Intake/Output this shift: Total I/O In: 120 [P.O.:120] Out: -   Examination:  General exam: AAOX3, ngt+ , NAD, weak appearing. HEENT:Oral mucosa moist, Ear/Nose WNL grossly, dentition normal. Respiratory system: bilaterally CLEAR,no wheezing or crackles,no use of accessory muscle Cardiovascular system: S1 & S2 +, No JVD,. Gastrointestinal system: Abdomen soft, NT,ND, BS+ , left colostomy bag with stool present. Nervous System:Alert, awake, moving extremities and grossly nonfocal Extremities: Mild nonpitting lower leg edema, distal peripheral pulses palpable.  Skin: No rashes,no icterus.  Dry scaly skin MSK: Normal muscle bulk,tone, power   Foley + lue picc+  Last photograph by surgery   Data Reviewed: I have personally reviewed following labs and imaging studies CBC: Recent Labs  Lab 01/23/20 0441 01/24/20 0420 01/25/20 0452 01/26/20 0428 01/27/20 0426  WBC 11.3* 9.8 12.3* 11.6* 10.0  HGB 7.7* 7.8* 7.6* 7.9* 7.7*  HCT 25.4* 25.0* 24.6* 25.9* 24.7*  MCV 97.3 96.5 96.5 96.3 95.7  PLT 386 378 399 429* 110*   Basic Metabolic Panel: Recent Labs  Lab 01/23/20 0441 01/24/20 0420 01/25/20 0452 01/26/20 0428 01/27/20 0426  NA 135 132* 132* 135 133*  K 4.1 4.0 4.3 3.7  4.4  CL 96*  94* 93* 96* 95*  CO2 26 28 28 29 28   GLUCOSE 142* 143* 156* 128* 126*  BUN 13 15 15 15 16   CREATININE 0.31* <0.30* <0.30* <0.30* <0.30*  CALCIUM 8.9 8.8* 8.8* 9.1 9.2   GFR: CrCl cannot be calculated (This lab value cannot be used to calculate CrCl because it is not a number: <0.30). Liver Function Tests: No results for input(s): AST, ALT, ALKPHOS, BILITOT, PROT, ALBUMIN in the last 168 hours. No results for input(s): LIPASE, AMYLASE in the last 168 hours. No results for input(s): AMMONIA in the last 168 hours. Coagulation Profile: No results for input(s): INR, PROTIME in the last 168 hours. Cardiac Enzymes: No results for input(s): CKTOTAL, CKMB, CKMBINDEX, TROPONINI in the last 168 hours. BNP (last 3 results) No results for input(s): PROBNP in the last 8760 hours. HbA1C: No results for input(s): HGBA1C in the last 72 hours. CBG: Recent Labs  Lab 01/26/20 0739 01/26/20 1344 01/26/20 1738 01/26/20 2252 01/27/20 0642  GLUCAP 139* 88 112* 134* 118*   Lipid Profile: No results for input(s): CHOL, HDL, LDLCALC, TRIG, CHOLHDL, LDLDIRECT in the last 72 hours. Thyroid Function Tests: No results for input(s): TSH, T4TOTAL, FREET4, T3FREE, THYROIDAB in the last 72 hours. Anemia Panel: No results for input(s): VITAMINB12, FOLATE, FERRITIN, TIBC, IRON, RETICCTPCT in the last 72 hours. Sepsis Labs: No results for input(s): PROCALCITON, LATICACIDVEN in the last 168 hours.  No results found for this or any previous visit (from the past 240 hour(s)).    Radiology Studies: No results found.   LOS: 49 days   Antonieta Pert, MD [facility]  01/27/2020, 11:45 AM

## 2020-01-27 NOTE — Progress Notes (Signed)
Occupational Therapy Treatment Patient Details Name: Alyssa SABO MRN: [id number] DOB: 11-22-1970 Today's Date: 01/27/2020    History of present illness 49 y/o female with a heavy alcohol history admitted6/8/21  in the setting of confusion and presumed alcohol withdrawal who later developed sepsis from a gas forming wound, S/P debridement left gluteal wound for necratizing fascitis.VDRF,. Extubated 01/07/20., negative for DVT in bilateral legs and PE. Negative DVT Left and right UE   OT comments  Patient progressing well towards acute OT goals. Agreeable to practicing sit to stand with stedy, declined transfer to chair today. Patient require min A x2 with x3 trials with first stand lasting ~30 seconds, patient then able to maintain 1 min for last 2 trials. Patient encouraged on building strength and endurance necessary for functional mobility/transfers and self care.  Follow Up Recommendations  SNF    Equipment Recommendations  Other (comment) (TBD)       Precautions / Restrictions Precautions Precautions: Fall Precaution Comments: L gluteal wound, colostomy, PEG tube       Mobility Bed Mobility Overal bed mobility: Needs Assistance Bed Mobility: Supine to Sit;Sit to Supine     Supine to sit: Min assist Sit to supine: Mod assist   General bed mobility comments: HOB significantly elevated upon arrival, with use of bed rail patient min A to mobilize L LE and scoot L hip to EOB. mod A to lift LEs onto EOB when returning to supine  Transfers Overall transfer level: Needs assistance   Transfers: Sit to/from Stand Sit to Stand: Min assist;+2 safety/equipment;+2 physical assistance         General transfer comment: repeated x3 sit to stand from edge of bed for strengthening and endurance, patient stood ~30 seconds first trial then 1 min next two trials    Balance Overall balance assessment: Needs assistance Sitting-balance support: No upper extremity supported;Feet  supported Sitting balance-Leahy Scale: Fair     Standing balance support: Bilateral upper extremity supported;During functional activity Standing balance-Leahy Scale: Poor Standing balance comment: reliant on external support of stedy                           ADL either performed or assessed with clinical judgement   ADL Overall ADL's : Needs assistance/impaired                         Toilet Transfer: +2 for physical assistance;+2 for safety/equipment;Minimal assistance Toilet Transfer Details (indicate cue type and reason): simulated with sit to stand with stedy. patient complete x3                            Cognition Arousal/Alertness: Awake/alert Behavior During Therapy: WFL for tasks assessed/performed Overall Cognitive Status: Within Functional Limits for tasks assessed                                                     Pertinent Vitals/ Pain       Pain Assessment: Faces Faces Pain Scale: No hurt         Frequency  Min 2X/week        Progress Toward Goals  OT Goals(current goals can now be found in the care plan section)  Progress towards OT  goals: Progressing toward goals  Acute Rehab OT Goals Patient Stated Goal: To stand OT Goal Formulation: With patient Time For Goal Achievement: 02/03/20 Potential to Achieve Goals: Good ADL Goals Pt Will Perform Grooming: with set-up;sitting Pt Will Perform Upper Body Bathing: with set-up;sitting Pt Will Perform Upper Body Dressing: with set-up;sitting Pt Will Transfer to Toilet: with mod assist;stand pivot transfer;bedside commode;with +2 assist Pt Will Perform Toileting - Clothing Manipulation and hygiene: sit to/from stand;with mod assist Pt/caregiver will Perform Home Exercise Program: Both right and left upper extremity;Increased strength  Plan Discharge plan remains appropriate       AM-PAC OT "6 Clicks" Daily Activity     Outcome Measure   Help from  another person eating meals?: A Little Help from another person taking care of personal grooming?: A Little Help from another person toileting, which includes using toliet, bedpan, or urinal?: Total Help from another person bathing (including washing, rinsing, drying)?: A Lot Help from another person to put on and taking off regular upper body clothing?: A Little Help from another person to put on and taking off regular lower body clothing?: Total 6 Click Score: 13    End of Session Equipment Utilized During Treatment: Gait belt  OT Visit Diagnosis: Muscle weakness (generalized) (M62.81)   Activity Tolerance Patient tolerated treatment well   Patient Left in bed;with call bell/phone within reach;with bed alarm set   Nurse Communication Mobility status        Time: 7116-5790 OT Time Calculation (min): 26 min  Charges: OT General Charges $OT Visit: 1 Visit OT Treatments $Self Care/Home Management : 23-37 mins  Delbert Phenix OT Pager: [place] 01/27/2020, 3:37 PM

## 2020-01-28 LAB — GLUCOSE, CAPILLARY
Glucose-Capillary: 87 mg/dL (ref 70–99)
Glucose-Capillary: 89 mg/dL (ref 70–99)
Glucose-Capillary: 93 mg/dL (ref 70–99)
Glucose-Capillary: 99 mg/dL (ref 70–99)

## 2020-01-28 MED ORDER — PANTOPRAZOLE SODIUM 40 MG PO TBEC
40.0000 mg | DELAYED_RELEASE_TABLET | Freq: Two times a day (BID) | ORAL | Status: DC
  Administered 2020-01-28 (×2): 40 mg via ORAL
  Filled 2020-01-28 (×2): qty 1

## 2020-01-28 MED ORDER — ACETAMINOPHEN 160 MG/5ML PO SOLN
650.0000 mg | Freq: Four times a day (QID) | ORAL | Status: DC | PRN
  Administered 2020-01-28 – 2020-02-02 (×7): 650 mg via ORAL
  Filled 2020-01-28 (×8): qty 20.3

## 2020-01-28 MED ORDER — DIPHENHYDRAMINE HCL 12.5 MG/5ML PO ELIX
25.0000 mg | ORAL_SOLUTION | Freq: Four times a day (QID) | ORAL | Status: DC | PRN
  Administered 2020-01-28 – 2020-02-03 (×8): 25 mg via ORAL
  Filled 2020-01-28 (×10): qty 10

## 2020-01-28 MED ORDER — FOLIC ACID 1 MG PO TABS
1.0000 mg | ORAL_TABLET | Freq: Every day | ORAL | Status: DC
  Administered 2020-01-28 – 2020-02-03 (×7): 1 mg via ORAL
  Filled 2020-01-28 (×7): qty 1

## 2020-01-28 MED ORDER — POTASSIUM CHLORIDE CRYS ER 20 MEQ PO TBCR
20.0000 meq | EXTENDED_RELEASE_TABLET | Freq: Two times a day (BID) | ORAL | Status: DC
  Administered 2020-01-28: 20 meq via ORAL
  Filled 2020-01-28: qty 1

## 2020-01-28 MED ORDER — ASCORBIC ACID 500 MG PO TABS
500.0000 mg | ORAL_TABLET | Freq: Two times a day (BID) | ORAL | Status: DC
  Administered 2020-01-28 – 2020-02-03 (×12): 500 mg via ORAL
  Filled 2020-01-28 (×12): qty 1

## 2020-01-28 MED ORDER — METOPROLOL TARTRATE 25 MG PO TABS
12.5000 mg | ORAL_TABLET | Freq: Two times a day (BID) | ORAL | Status: DC
  Administered 2020-01-28: 12.5 mg via ORAL
  Filled 2020-01-28: qty 1

## 2020-01-28 MED ORDER — FERROUS SULFATE 325 (65 FE) MG PO TABS
300.0000 mg | ORAL_TABLET | Freq: Two times a day (BID) | ORAL | Status: DC
  Administered 2020-01-28 – 2020-02-03 (×12): 325 mg via ORAL
  Filled 2020-01-28 (×12): qty 1

## 2020-01-28 MED ORDER — TRAZODONE HCL 100 MG PO TABS
100.0000 mg | ORAL_TABLET | Freq: Every day | ORAL | Status: DC
  Administered 2020-01-28 – 2020-02-02 (×5): 100 mg via ORAL
  Filled 2020-01-28 (×7): qty 1

## 2020-01-28 MED ORDER — VITAMIN D (ERGOCALCIFEROL) 1.25 MG (50000 UNIT) PO CAPS
50000.0000 [IU] | ORAL_CAPSULE | ORAL | Status: DC
  Administered 2020-01-30: 50000 [IU] via ORAL
  Filled 2020-01-28: qty 1

## 2020-01-28 MED ORDER — ADULT MULTIVITAMIN W/MINERALS CH
1.0000 | ORAL_TABLET | Freq: Every day | ORAL | Status: DC
  Administered 2020-01-28 – 2020-02-03 (×7): 1 via ORAL
  Filled 2020-01-28 (×7): qty 1

## 2020-01-28 MED ORDER — THIAMINE HCL 100 MG PO TABS
100.0000 mg | ORAL_TABLET | Freq: Every day | ORAL | Status: DC
  Administered 2020-01-28 – 2020-02-03 (×7): 100 mg via ORAL
  Filled 2020-01-28 (×7): qty 1

## 2020-01-28 MED ORDER — ZINC SULFATE 220 (50 ZN) MG PO CAPS
220.0000 mg | ORAL_CAPSULE | Freq: Every day | ORAL | Status: DC
  Administered 2020-01-28 – 2020-02-03 (×7): 220 mg via ORAL
  Filled 2020-01-28 (×7): qty 1

## 2020-01-28 NOTE — TOC Progression Note (Signed)
Transition of Care [facility]) - Progression Note    Patient Details  Name: Alyssa Carey MRN: [id number] Date of Birth: 1971-01-22  [facility] CM/SW Contact  Toby Ayad, Juliann Pulse, RN Phone Number: 01/28/2020, 12:13 PM  Clinical Narrative: [facility]-rep Missy to eval for acceptance by LOG-leadership will assist w/[facility] determining if rates are agreeable for Nsg/PT.awaiting pasrr level 2.      Expected Discharge Plan: [place] Barriers to Discharge: Continued Medical Work up  Expected Discharge Plan and Services Expected Discharge Plan: [place] In-house Referral: Development worker, community Discharge Planning Services: CM Consult Post Acute Care Choice: [place] Living arrangements for the past 2 months: Single Family Home                                       Social Determinants of Health (SDOH) Interventions    Readmission Risk Interventions No flowsheet data found.

## 2020-01-28 NOTE — Plan of Care (Signed)

## 2020-01-28 NOTE — Plan of Care (Signed)

## 2020-01-28 NOTE — Progress Notes (Signed)
  Speech Language Pathology Treatment: Dysphagia  Patient Details Name: Alyssa Carey MRN: [id number] DOB: 1971-03-07 Today's Date: 01/28/2020 Time: 03-1739 SLP Time Calculation (min) (ACUTE ONLY): 20 min  Assessment / Plan / Recommendation Clinical Impression  Session focused on dysphagia treatment by treatment of RMST for maximal airway protection/pulmonary clearance with intake.  SLP obtained a new IMT device as she reports her was lost.  Clinically calibrated trainer to 17 cmH20 pressure given pt work effort reported to be level 7-8 of 10 overall.  Initially pt required mod verbal/visual cues for use of device including timing of exhalation/inhalation,placing device to lips just before inhaling as pt indicates resistance with attempting to exhale fully through device before lip seal, allowance of adequate time between repetitions and contraindications for its use including shortness of breath, pain.  Pt conducted 13 repetitions with cues fading to min prior to end of the session.    Regarding swallow ability, pt denies coughing with any intake *although she did silently aspirate on MBS* suspect large amount of aspiration would produce reflexive cough.  Pt reporting intake is good and voice subjectively continues to improve with small bore feeding tube removed.  Will continue RMST with this pt to maximize her pulmonary strength given her ongoing dysphonia possibly impacting airway protection with po.  Thanks.    HPI HPI: 49 year old with alcohol abuse, alcohol withdrawal, necrotizing fasciitis of left buttock status post debridement. Acute respiratory failure secondary to septic shock. Intubated from 6/8 to 6/23.   Pt underwent MBS on Saturday 01/09/2020 with findings of infrequent and trace, silent aspiration of nectar and thin.  Recommended to start diet of dys1/small, controlled sips of nectar.  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  ENT advised pt high aspiration risk.  Follow up indicated.      SLP Plan  Continue with current plan of care       Recommendations  Diet recommendations: Dysphagia 3 (mechanical soft);Thin liquid Liquids provided via: Cup;Straw Medication Administration: Whole meds with puree (as tolerated) Supervision: Patient able to self feed;Intermittent supervision to cue for compensatory strategies Compensations: Slow rate;Small sips/bites Postural Changes and/or Swallow Maneuvers: Seated upright 90 degrees                Oral Care Recommendations: Oral care QID Follow up Recommendations: Other (comment) SLP Visit Diagnosis: Dysphagia, oropharyngeal phase (R13.12) Plan: Continue with current plan of care       Collbran, Alyssa Carey 01/28/2020, 5:51 PM  Kathleen Lime, MS [facility] SLP [place] Office [phone]

## 2020-01-28 NOTE — Progress Notes (Signed)
   01/28/20 1542 Hydrotherapy note 1420-1517     Subjective I need that strong medicine  Patient and Family Stated Goals agreed to wound care today,   Date of Onset  (present on admission)  Prior Treatments s/p surgical I & D x2, diverting colostomy  Evaluation and Treatment  Evaluation and Treatment Procedures Explained to Patient/Family Yes  Evaluation and Treatment Procedures agreed to  Wound / Incision (Open or Dehisced) 12/27/19 Non-pressure wound Buttocks Left *PT ONLY* open wound L gluteal area  Date First Assessed/Time First Assessed: 12/27/19 1410   Wound Type: Non-pressure wound  Location: Buttocks  Location Orientation: Left  Wound Description (Comments): *PT ONLY* open wound L gluteal area  Present on Admission: (c)   Dressing Type ABD;Moist to dry (santyl)  Dressing Changed New  Dressing Status Clean  Dressing Change Frequency Twice a day  % Wound base Red or Granulating 90%  % Wound base Yellow/Fibrinous Exudate 10%  Peri-wound Assessment Intact  Wound Length (cm) 16 cm  Wound Width (cm) 14 cm  Wound Depth (cm) 4 cm  Wound Volume (cm^3) 896 cm^3  Wound Surface Area (cm^2) 224 cm^2  Undermining (cm) 4  Margins Unattached edges (unapproximated)  Drainage Amount Minimal  Drainage Description No odor;Serosanguineous  Non-staged Wound Description Full thickness  Treatment Cleansed;Hydrotherapy (Pulse lavage);Packing (Saline gauze) (santyl)  Hydrotherapy  Pulsed lavage therapy - wound location L gluteal area   Pulsed Lavage with Suction (psi) 8 psi  Pulsed Lavage with Suction - Normal Saline Used 500 mL  Pulsed Lavage Tip Tip with splash shield  Wound Therapy - Assess/Plan/Recommendations  Wound Therapy - Clinical Statement wound continues to progress, maximum benefit from [facility] therapy reached with 10% yellow fibrinous tissue remains at 12 to 3:00. Per surgery on 7/12, Hydrotherapy may be discontinued today. Patient pleased with progress.  Wound Therapy - Functional  Problem List decr mobility/weakness  Factors Delaying/Impairing Wound Healing Substance abuse;Immobility;Multiple medical problems  Hydrotherapy Plan Discontinued today  Wound Therapy - Frequency No further treatment  Wound Therapy - Current Recommendations PT  Wound Therapy - Follow Up Recommendations Skilled nursing facility  Wound Plan Hydrotherapy is discontinued per surgery.  Wound Therapy Goals - Improve the function of patient's integumentary system by progressing the wound(s) through the phases of wound healing by:  Decrease Necrotic Tissue to 100  Decrease Necrotic Tissue - Progress Progressing toward goal  yellow fibrinous with slowly slough  Increase Granulation Tissue to 0  Increase Granulation Tissue - Progress Progressing toward goal  Decrease Length/Width/Depth by (cm) 1/1/1  Improve Drainage Characteristics Min  Improve Drainage Characteristics - Progress Met  Patient/Family will be able to  perform pressure relief & verbalize dressing changes  Patient/Family Instruction Goal - Progress Met  Goals/treatment plan/discharge plan were made with and agreed upon by patient/family Yes (patient agrees with DC of hydrotherapy)  Time For Goal Achievement  (max benefit reached no further  treatment)  Wound Therapy - Potential for Goals No further  Hydro goals

## 2020-01-28 NOTE — Progress Notes (Signed)
PROGRESS NOTE    Alyssa Carey  UDT:[id number] DOB: March 31, 1971 DOA: 12/21/2019 PCP: Patient, No Pcp Per   Chef Complaints:: Alcohol withdrawal   Brief Narrative:49 year old female who presented secondary to concern for alcohol withdrawal and quickly developed evidence of necrotizing fasciitis, emergently managed in the OR with critical care, general surgery with I&D.  Patient was in ICU needing intubation and vasopressors for septic shock underwent repeat I&D and diverting colostomy.  She has been extubated and off vasopressors. remains in the stepdown unit.  Wound is slowly healing being managed by surgery wound care and had been on hydrotherapy Monday Wednesday Friday.  Followed by speech therapy oral intake inadequate on modified diet and on NG tube feeding. Patient is slowly improving. She had evidence of fluid overload with weight increased up to 260 pound from admission weight of 193-212 lb-placed on IV Lasix with good output and improving weight and switched to oral Lasix, wt down to 220 .   Significant events 6/06 admission 6/08 ICU admission to OR ofr I&D, back intubated and on pressors, 6/09 started tube feeds. Still pressor dependent,kept on vent w/ anticipated return to OR planned  6/10 Back to the OR for significant debridement and also placement of diverting colostomy 6/11 Sedated, low-dose phenylephrine. CCS felt wound looking good. Sedation changed to precedex / PRN fent  6/12 more awake, on 15/5 pressure support 6/13 family discussion >> DNR, continue medical care 6//15 transfuse 1 unit PRBC; vomiting, tube feeds held 6/16 Vomiting overnight 6/17 Trial PSV 15/5 with variable Vt, remains on vasopressors. Vomiting overnight  6/18 transfuse 1 unit PRBC 6/20 Off pressors, On precedex and weaning on PSV  6/21 Weaning on PSV, planned hydrotherapy 6/23 Extubated 6/24 Transferred to [facility] service. 01/22/20- Tx out of SDU Patient had evidence of fluid overload with weight gain of  2 to 60 pounds from baseline around 200s diarrhea.  IV Lasix with a significantly improved , lasix stopped 7/12. 7/12: Surgery on eval satisfied with her wound healing and will continue twice daily WD dressing and signed off. 7/13-doing well with this.  Starting on dysphagia 3 thin liquid diet and NG tube discontinued  Subjective: Complains of not getting enough sleep No nausea vomiting.  Having output in colostomy. Intermittent tachycardia ( not new)  Assessment & Plan:  Necrotizing fasciitis of pelvic region and thigh: Patient had emergent operative intervention with incision and debridement, diverting colostomy and had multiple rounds of IV antibiotics (last dose 6/22)-and her wound culture grew E. coli and VRE.  Foley to promote healing.  Seen by surgery, was on hydrotherapy Monday Wednesday Friday.  On 7/12 wound looked significantly improved advised hydrotherapy possibly 1 more on Wednesday and follow-up with Dr. [place] [facility] with a wound clinic and continue WD dressing twice a day.  Continue current wound care.  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  Speech eval completed as been following very closely now on dysphagia 3 diet/thin diet and off NGT.  Leukocytosis:Leukocytosis resolved.   Hypokalemia/hypomagnesemia: Electrolytes are stable.   Septic shock secondary to #- resovled.  Blood pressure stable.   Acute hypoxic respiratory failure: On room air.  Resolved.  Severe protein calorie malnutrition with low albumin, anasarca with poor nutritional status due to severe illness:  Continue dysphagia 3 diet as per speech, continue nutrition supplement, dietitian following.   Fluid overload/anasarca: Suspecting  from fluid resuscitation antibiotics and hypoalbuminemia On admission weight was 193 to 213 pounds, on 7/1:256.1- 260 lb and patient was diuresed with IV Lasix with improved.  Oral Lasix has been discontinued 7/12. Wt down to 200 lb  Pressure injury/DTI of right ear not present on admission.  Continue local supportive care and dressing.  AKI with unknown baseline 1.7 upon admission: AKI has resolved.    Hyperglycemia while on tube feeding.  Discontinued her Lantus.   Anemia of critical illness/acute blood loss anemia Baseline hemoglobin 12-13 developed anemia in the setting of infection critical illness bleeding.patient was seen by GI for rectal bleeding underwent sigmoidoscopy flexible  Showed-10 cm Stercoral ulcer 6/28 received 3 units PRBC.  Hemoglobin 7.7 g.  Continue to monitor closely.    Recent Labs  Lab 01/23/20 0441 01/24/20 0420 01/25/20 0452 01/26/20 0428 01/27/20 0426  HGB 7.7* 7.8* 7.6* 7.9* 7.7*  HCT 25.4* 25.0* 24.6* 25.9* 24.7*   Morbid obesity with BMI 43.9.  With diuresis weight has improved and BMI is 34 now.  Will benefit with outpatient follow-up healthy lifestyle.   Sinus tachycardia heart rate intermittently elevated ranging from 100-120s and this has been making her MEWS red   She has had extensive evaluation negative for DVT in lower extremities and upper extremities.  She had CT of the chest negative for PE.  TSH is normal.  Continue on metoprolol and adjust as needed.    Erythema generalized: likely from use of Purple Wipe by patient's family and patient's family has been educated on not to do anything without asking patient's nurses.it has resolved.    DVT prophylaxis: enoxaparin (LOVENOX) injection 40 mg Start: 01/26/20 1600 Place and maintain sequential compression device Start: 01/10/20 1738. Resumed lovenox  7/12 as no more bleeding and she remains at high risk for VTE.  If she rebleeds from the wound will need to stop it Code Status: full Family  Communication: plan of care discussed with patient and nursing staffs at bedside.  Status is: Inpatient  Remains inpatient appropriate because:Inpatient level of care appropriate due to severity of illness and For ongoing management wound, postop, multiple issues, poor oral intake remains tachycardic with adjusting metoprolol, and also fluid overload and getting diuresed with Lasix.  Receiving nutrition with poor intake speech following and remain on NG tube feeding.  Dispo: The patient is from: Home              Anticipated d/c is to: Skilled nursing facility..              Anticipated d/c date is: Once bed available              Patient currently is medically stable.at this time she is awaiting for placement.    Nutrition: Nutrition and speech are following. Diet Order            DIET DYS 3 Room service appropriate? Yes; Fluid consistency: Thin  Diet effective now                 Nutrition Problem: Increased nutrient needs Etiology: chronic illness, wound healing (alcohol abuse; necrotizing fasciitis) Signs/Symptoms: estimated needs Interventions: Prostat, Juven, MVI, Other (Comment) [employer]) Body mass index is 34.55 kg/m. Pressure Ulcer: Pressure Injury 12/27/19 Ear Right Stage 3 -  Full thickness tissue loss. Subcutaneous fat may be visible but bone, tendon or muscle are NOT exposed.  previously noted unstageable wound has evolved into a healing stage 3 when assessed on 7/5 (Active)  12/27/19 1200  Location: Ear  Location Orientation: Right  Staging: Stage 3 -  Full thickness tissue loss. Subcutaneous fat may be visible but bone, tendon or muscle are NOT exposed.  Wound Description (Comments): previously noted unstageable wound has evolved into a healing stage 3 when assessed on 7/5  Present on Admission: No   Consultants:see note  Procedures:  ENDOTRACHEAL INTUBATION (6/8 >> 01/07/2020)  INCISION AND DEBRIDEMENT OF LEFT BUTTOCK AND PERINEUM (12/23/2019)  IRRIGATION AND  DEBRIDEMENT OF BUTTOCKS; LAPAROSCOPIC LOOP COLOSTOMY (12/25/2019)  FLEXIBLE SIGMOIDOSCOPY (01/11/2020)   Microbiology:see note  Medications: Scheduled Meds: . ascorbic acid  500 mg Oral BID  . Chlorhexidine Gluconate Cloth  6 each Topical Daily  . collagenase   Topical Daily  . enoxaparin (LOVENOX) injection  40 mg Subcutaneous Q24H  . feeding supplement (KATE FARMS STANDARD 1.4)  325 mL Oral BID BM  . feeding supplement (PRO-STAT SUGAR FREE 64)  30 mL Oral BID BM  . ferrous sulfate  325 mg Oral BID WC  . [START ON 2/83/6629] folic acid  1 mg Oral Daily  . insulin aspart  0-6 Units Subcutaneous Q6H  . mouth rinse  15 mL Mouth Rinse BID  . melatonin  3 mg Oral QHS  . metoprolol tartrate  12.5 mg Oral BID  . mineral oil-hydrophilic petrolatum   Topical TID  . [START ON 01/29/2020] multivitamin with minerals  1 tablet Oral Daily  . nutrition supplement (JUVEN)  1 packet Oral BID BM  . pantoprazole  40 mg Oral BID  . potassium chloride  20 mEq Oral BID  . sodium chloride flush  10-40 mL Intracatheter Q12H  . [START ON 01/29/2020] thiamine  100 mg Oral Daily  . traZODone  100 mg Oral QHS  . [START ON 01/30/2020] Vitamin D (Ergocalciferol)  50,000 Units Oral Q Fri  . [START ON 01/29/2020] zinc sulfate  220 mg Oral Daily   Continuous Infusions: . sodium chloride      Antimicrobials:  Ceftriaxone  Vancomycin  Clindamycin  Linezolid  Flagyl  Finished all antibiotic therapy. Anti-infectives (From admission, onward)   Start     Dose/Rate Route Frequency Ordered Stop   12/31/19 1600  metroNIDAZOLE (FLAGYL) IVPB 500 mg  Status:  Discontinued        500 mg 100 mL/hr over 60 Minutes Intravenous Every 8 hours 12/31/19 1505 01/06/20 0859   12/28/19 1400  vancomycin (VANCOCIN) IVPB 1000 mg/200 mL premix  Status:  Discontinued        1,000 mg 200 mL/hr over 60 Minutes Intravenous Every 24 hours 12/28/19 1005 12/28/19 1343   12/28/19 1400  linezolid (ZYVOX) IVPB 600 mg        600  mg 300 mL/hr over 60 Minutes Intravenous Every 12 hours 12/28/19 1343 01/06/20 1954   12/26/19 1948  vancomycin variable dose per unstable renal function (pharmacist dosing)  Status:  Discontinued         Does not apply See admin instructions 12/26/19 1948 12/28/19 1005   12/23/19 2000  vancomycin (VANCOCIN) IVPB 1000 mg/200 mL premix  Status:  Discontinued        1,000 mg 200 mL/hr over 60 Minutes Intravenous Every 8 hours 12/23/19 1027 12/26/19 1944   12/23/19 1300  clindamycin (CLEOCIN) IVPB 900 mg  Status:  Discontinued        900 mg 100 mL/hr over 30 Minutes Intravenous Every 8  hours 12/23/19 1205 12/31/19 1505   12/23/19 1200  metroNIDAZOLE (FLAGYL) IVPB 500 mg  Status:  Discontinued        500 mg 100 mL/hr over 60 Minutes Intravenous Every 8 hours 12/23/19 1135 12/23/19 1254   12/23/19 1045  vancomycin (VANCOREADY) IVPB 2000 mg/400 mL        2,000 mg 200 mL/hr over 120 Minutes Intravenous NOW 12/23/19 1018 12/23/19 1441   12/23/19 0300  cefTRIAXone (ROCEPHIN) 2 g in sodium chloride 0.9 % 100 mL IVPB  Status:  Discontinued        2 g 200 mL/hr over 30 Minutes Intravenous Every 24 hours 12/23/19 0236 01/06/20 0859       Objective: Vitals: Today's Vitals   01/28/20 0018 01/28/20 0544 01/28/20 0619 01/28/20 1212  BP: 99/71  108/62   Pulse: (!) 115  (!) 106   Resp: 18  18   Temp: 98 F (36.7 C)  98.4 F (36.9 C)   TempSrc: Oral  Oral   SpO2: 99%  97%   Weight:      Height:      PainSc:  2   8     Intake/Output Summary (Last 24 hours) at 01/28/2020 1252 Last data filed at 01/28/2020 0900 Gross per 24 hour  Intake 720 ml  Output 2950 ml  Net -2230 ml   Filed Weights   01/22/20 0416 01/25/20 0500 01/26/20 0500  Weight: 100.1 kg 93.3 kg 91.3 kg   Weight change:    Intake/Output from previous day: 07/13 0701 - 07/14 0700 In: 600 [P.O.:600] Out: 3925 [Urine:3575; Stool:350] Intake/Output this shift: Total I/O In: 240 [P.O.:240] Out: -   Examination:  General  exam: AAOx3 , NAD, weak appearing. HEENT:Oral mucosa moist, Ear/Nose WNL grossly, dentition normal. Respiratory system: bilaterally clear,no wheezing or crackles,no use of accessory muscle Cardiovascular system: S1 & S2 +, No JVD,. Gastrointestinal system: Abdomen soft, NT,ND, BS+ left colostomy with stool + Nervous System:Alert, awake, moving extremities and grossly nonfocal Extremities: No edema, distal peripheral pulses palpable.  Skin: No rashes,no icterus. MSK: Normal muscle bulk,tone, power Foley + lue picc+  Last photograph by surgery   Data Reviewed: I have personally reviewed following labs and imaging studies CBC: Recent Labs  Lab 01/23/20 0441 01/24/20 0420 01/25/20 0452 01/26/20 0428 01/27/20 0426  WBC 11.3* 9.8 12.3* 11.6* 10.0  HGB 7.7* 7.8* 7.6* 7.9* 7.7*  HCT 25.4* 25.0* 24.6* 25.9* 24.7*  MCV 97.3 96.5 96.5 96.3 95.7  PLT 386 378 399 429* 161*   Basic Metabolic Panel: Recent Labs  Lab 01/23/20 0441 01/24/20 0420 01/25/20 0452 01/26/20 0428 01/27/20 0426  NA 135 132* 132* 135 133*  K 4.1 4.0 4.3 3.7 4.4  CL 96* 94* 93* 96* 95*  CO2 26 28 28 29 28   GLUCOSE 142* 143* 156* 128* 126*  BUN 13 15 15 15 16   CREATININE 0.31* <0.30* <0.30* <0.30* <0.30*  CALCIUM 8.9 8.8* 8.8* 9.1 9.2   GFR: CrCl cannot be calculated (This lab value cannot be used to calculate CrCl because it is not a number: <0.30). Liver Function Tests: No results for input(s): AST, ALT, ALKPHOS, BILITOT, PROT, ALBUMIN in the last 168 hours. No results for input(s): LIPASE, AMYLASE in the last 168 hours. No results for input(s): AMMONIA in the last 168 hours. Coagulation Profile: No results for input(s): INR, PROTIME in the last 168 hours. Cardiac Enzymes: No results for input(s): CKTOTAL, CKMB, CKMBINDEX, TROPONINI in the last 168 hours. BNP (last 3  results) No results for input(s): PROBNP in the last 8760 hours. HbA1C: No results for input(s): HGBA1C in the last 72  hours. CBG: Recent Labs  Lab 01/27/20 0642 01/27/20 1145 01/27/20 1756 01/28/20 0015 01/28/20 0618  GLUCAP 118* 109* 98 99 93   Lipid Profile: No results for input(s): CHOL, HDL, LDLCALC, TRIG, CHOLHDL, LDLDIRECT in the last 72 hours. Thyroid Function Tests: No results for input(s): TSH, T4TOTAL, FREET4, T3FREE, THYROIDAB in the last 72 hours. Anemia Panel: No results for input(s): VITAMINB12, FOLATE, FERRITIN, TIBC, IRON, RETICCTPCT in the last 72 hours. Sepsis Labs: No results for input(s): PROCALCITON, LATICACIDVEN in the last 168 hours.  No results found for this or any previous visit (from the past 240 hour(s)).    Radiology Studies: No results found.   LOS: 74 days   Antonieta Pert, MD [facility]  01/28/2020, 12:52 PM

## 2020-01-29 LAB — GLUCOSE, CAPILLARY
Glucose-Capillary: 100 mg/dL — ABNORMAL HIGH (ref 70–99)
Glucose-Capillary: 127 mg/dL — ABNORMAL HIGH (ref 70–99)

## 2020-01-29 MED ORDER — PANTOPRAZOLE SODIUM 40 MG PO TBEC
40.0000 mg | DELAYED_RELEASE_TABLET | Freq: Every day | ORAL | Status: DC
  Administered 2020-01-30 – 2020-02-03 (×5): 40 mg via ORAL
  Filled 2020-01-29 (×6): qty 1

## 2020-01-29 MED ORDER — HYDROMORPHONE HCL 1 MG/ML IJ SOLN
1.0000 mg | Freq: Once | INTRAMUSCULAR | Status: AC
  Administered 2020-01-29: 1 mg via INTRAVENOUS
  Filled 2020-01-29: qty 1

## 2020-01-29 MED ORDER — METOPROLOL TARTRATE 25 MG PO TABS
25.0000 mg | ORAL_TABLET | Freq: Two times a day (BID) | ORAL | Status: DC
  Administered 2020-01-29 – 2020-02-03 (×10): 25 mg via ORAL
  Filled 2020-01-29 (×11): qty 1

## 2020-01-29 NOTE — Consult Note (Signed)
[place] Nurse ostomy follow up Stoma type/location: LLQ, end colostomy Stomal assessment/size: 1/2" x 1" oval shaped, flush, in a crease when patient sits up Peristomal assessment: slight denudation noted from 5-8 o'clock  Treatment options for stomal/peristomal skin: 2" skin barrier ring Output liquid green Ostomy pouching: 1pc.flex convex with 2" barrier ring.  Education provided:  Explained role of ostomy nurse and creation of stoma  Explained stoma characteristics (budded, flush, color, texture, care) Demonstrated pouch change (cutting new skin barrier, measuring stoma, cleaning peristomal skin and stoma, use of barrier ring) Education on emptying when 1/3 to 1/2 full and how to empty Discussed use of wick to clean spout  Patient has been in the hospital with stoma since early June, she reports "watching the staff" do pouch changes and empty. She is able to participate today with opening and closing her pouch. She verbalized some of the steps in pouch change.   Enrolled patient in [place] Start Discharge program: Yes  Plans for possible CIR or SNF at DC for a short time.   [place] Nurse will follow along with you for continued support with ostomy teaching and care [place] MSN, RN, [place], [place], [place]

## 2020-01-29 NOTE — TOC Progression Note (Signed)
Transition of Care [facility]) - Progression Note    Patient Details  Name: RAAHI KORBER MRN: [id number] Date of Birth: 12-14-1970  [facility] CM/SW Contact  Denea Cheaney, Juliann Pulse, RN Phone Number: 01/29/2020, 12:24 PM  Clinical Narrative:  [employer] rep Missy following for d/c with LOG.Awaiting Pasrr.     Expected Discharge Plan: [place] Barriers to Discharge: Continued Medical Work up  Expected Discharge Plan and Services Expected Discharge Plan: [place] In-house Referral: Development worker, community Discharge Planning Services: CM Consult Post Acute Care Choice: [place] Living arrangements for the past 2 months: Single Family Home                                       Social Determinants of Health (SDOH) Interventions    Readmission Risk Interventions No flowsheet data found.

## 2020-01-29 NOTE — Progress Notes (Signed)
PROGRESS NOTE    Alyssa Carey  RCB:[id number] DOB: [birth year] DOA: 12/21/2019 PCP: Patient, No Pcp Per   Chef Complaints:: Alcohol withdrawal   Brief Narrative:49 year old female who presented secondary to concern for alcohol withdrawal and quickly developed evidence of necrotizing fasciitis, emergently managed in the OR with critical care, general surgery with I&D.  Patient was in ICU needing intubation and vasopressors for septic shock underwent repeat I&D and diverting colostomy.  She has been extubated and off vasopressors. remains in the stepdown unit.  Wound is slowly healing being managed by surgery wound care and had been on hydrotherapy Monday Wednesday Friday.  Followed by speech therapy oral intake inadequate on modified diet and on NG tube feeding. Patient is slowly improving. She had evidence of fluid overload with weight increased up to 260 pound from admission weight of 193-212 lb-placed on IV Lasix with good output and improving weight and switched to oral Lasix, wt down to 220 .   Significant events 6/06 admission 6/08 ICU admission to OR ofr I&D, back intubated and on pressors, 6/09 started tube feeds. Still pressor dependent,kept on vent w/ anticipated return to OR planned  6/10 Back to the OR for significant debridement and also placement of diverting colostomy 6/11 Sedated, low-dose phenylephrine. CCS felt wound looking good. Sedation changed to precedex / PRN fent  6/12 more awake, on 15/5 pressure support 6/13 family discussion >> DNR, continue medical care 6//15 transfuse 1 unit PRBC; vomiting, tube feeds held 6/16 Vomiting overnight 6/17 Trial PSV 15/5 with variable Vt, remains on vasopressors. Vomiting overnight  6/18 transfuse 1 unit PRBC 6/20 Off pressors, On precedex and weaning on PSV  6/21 Weaning on PSV, planned hydrotherapy 6/23 Extubated 6/24 Transferred to [facility] service. 01/22/20- Tx out of SDU Patient had evidence of fluid overload with weight gain of  2 to 60 pounds from baseline around 200s diarrhea.  IV Lasix with a significantly improved , lasix stopped 7/12. 7/12: Surgery on eval satisfied with her wound healing and will continue twice daily WD dressing and signed off. 7/13-doing well with this.  Starting on dysphagia 3 thin liquid diet and NG tube discontinued  Subjective: No new complaints.  Pain given her mobile. Intermittent tachycardia. No fever nausea vomiting. Assessment & Plan:  Necrotizing fasciitis of pelvic region and thigh: Patient had emergent operative intervention with incision and debridement, diverting colostomy and had multiple rounds of IV antibiotics (last dose 6/22)-and her wound culture grew E. coli and VRE.  Patient on hydrotherapy and has completed.  Per surgery continue twice daily WD dressing and follow-up with wound care as outpatient.  Foley in place to promote healing.  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  Speech eval completed as been following very closely now on dysphagia 3 diet/thin diet and off NGT.  Will retain diet.  Severe protein calorie malnutrition with low albumin, anasarca with poor nutritional status due to severe illness: Continue dysphagia diet as per speech.  Nutrition supplement thiamine folate.  Continue to augment nutrition.  Fluid overload/anasarca: resolved. Off lasix 7/12. On admission weight was 193 to 213 pounds, went up to 260 lb and patient was diuresed. Wt down to 174 lb.  Pressure injury/DTI of right ear not present on admission.  Continue local supportive care and dressing.  AKI with unknown baseline 1.7 upon admission: AKI has resolved.    Hyperglycemia while on tube feeding.  Discontinued  her Lantus.   Anemia of critical illness/acute blood loss anemia Baseline hemoglobin 12-13 developed anemia in the setting of infection critical illness bleeding.patient was seen by GI for rectal bleeding underwent sigmoidoscopy flexible  Showed-10 cm Stercoral ulcer 6/28 received 3 units PRBC.  Check CBC intermittently.   Recent Labs  Lab 01/23/20 0441 01/24/20 0420 01/25/20 0452 01/26/20 0428 01/27/20 0426  HGB 7.7* 7.8* 7.6* 7.9* 7.7*  HCT 25.4* 25.0* 24.6* 25.9* 24.7*   Morbid obesity with BMI 43.9.  Loss weight with diuresis, BMI down to 29.  Encourage healthy lifestyle.   Sinus tachycardia: HR at times 100-120s and this has been making her MEWS red   She has had extensive evaluation negative for DVT in lower extremities and upper extremities.  She had CT of the chest negative for PE.  TSH is normal.  Will increase metoprolol 25 twice daily as long as blood pressure tolerates.    Erythema generalized from chemical wipe- resolved.patient's family has been educated on not to do anything without asking patient's nurses. Leukocytosis:Leukocytosis resolved.   Hypokalemia/hypomagnesemia: stable, off kcl po Septic shock secondary to #1- resolved Acute hypoxic respiratory failure:resolved   DVT prophylaxis: enoxaparin (LOVENOX) injection 40 mg Start: 01/26/20 1600 Place and maintain sequential compression device Start: 01/10/20 1738. Resumed lovenox  7/12 as no more bleeding and she remains at high risk for VTE.  If she rebleeds from the wound will need to stop it Code Status: full Family Communication: plan of care discussed with patient and nursing staffs at bedside.  Status is: Inpatient  Remains inpatient appropriate because of unsafe disposition.continue on SNF placement.  Dispo: The patient is from: Home              Anticipated d/c is to: Skilled nursing facility..              Anticipated d/c date is: Once bed available              Patient currently is medically stable.at  this time she is awaiting for placement.    Nutrition: Nutrition and speech are following. Diet Order            DIET DYS 3 Room service appropriate? Yes; Fluid consistency: Thin  Diet effective now                 Nutrition Problem: Increased nutrient needs Etiology: chronic illness, wound healing (alcohol abuse; necrotizing fasciitis) Signs/Symptoms: estimated needs Interventions: Prostat, Juven, MVI, Other (Comment) [employer]) Body mass index is 29.97 kg/m. Pressure Ulcer: Pressure Injury 12/27/19 Ear Right Stage 3 -  Full thickness tissue loss. Subcutaneous fat may be visible but bone, tendon or muscle are NOT exposed. previously noted unstageable wound has evolved into a healing stage 3 when assessed on 7/5 (Active)  12/27/19 1200  Location: Ear  Location Orientation: Right  Staging: Stage 3 -  Full thickness tissue loss. Subcutaneous fat may be visible but bone, tendon or muscle are NOT exposed.  Wound Description (Comments): previously noted unstageable wound has evolved into a healing stage 3 when assessed on 7/5  Present on Admission: No   Consultants:see note  Procedures:  ENDOTRACHEAL INTUBATION (6/8 >> 01/07/2020)  INCISION AND DEBRIDEMENT OF LEFT BUTTOCK AND PERINEUM (12/23/2019)  IRRIGATION AND DEBRIDEMENT OF BUTTOCKS; LAPAROSCOPIC LOOP COLOSTOMY (12/25/2019)  FLEXIBLE SIGMOIDOSCOPY (01/11/2020)  Microbiology:see note  Medications: Scheduled Meds: . ascorbic acid  500 mg Oral BID  . Chlorhexidine Gluconate Cloth  6 each Topical Daily  . collagenase   Topical Daily  . enoxaparin (LOVENOX) injection  40 mg Subcutaneous Q24H  . feeding supplement (KATE FARMS STANDARD 1.4)  325 mL Oral BID BM  . feeding supplement (PRO-STAT SUGAR FREE 64)  30 mL Oral BID BM  . ferrous sulfate  325 mg Oral BID WC  . folic acid  1 mg Oral Daily  . mouth rinse  15 mL Mouth Rinse BID  . melatonin  3 mg Oral QHS  . metoprolol tartrate  25 mg Oral BID  . mineral oil-hydrophilic  petrolatum   Topical TID  . multivitamin with minerals  1 tablet Oral Daily  . nutrition supplement (JUVEN)  1 packet Oral BID BM  . pantoprazole  40 mg Oral BID  . sodium chloride flush  10-40 mL Intracatheter Q12H  . thiamine  100 mg Oral Daily  . traZODone  100 mg Oral QHS  . [START ON 01/30/2020] Vitamin D (Ergocalciferol)  50,000 Units Oral Q Fri  . zinc sulfate  220 mg Oral Daily   Continuous Infusions: . sodium chloride      Antimicrobials:  Ceftriaxone  Vancomycin  Clindamycin  Linezolid  Flagyl  Finished all antibiotic therapy. Anti-infectives (From admission, onward)   Start     Dose/Rate Route Frequency Ordered Stop   12/31/19 1600  metroNIDAZOLE (FLAGYL) IVPB 500 mg  Status:  Discontinued        500 mg 100 mL/hr over 60 Minutes Intravenous Every 8 hours 12/31/19 1505 01/06/20 0859   12/28/19 1400  vancomycin (VANCOCIN) IVPB 1000 mg/200 mL premix  Status:  Discontinued        1,000 mg 200 mL/hr over 60 Minutes Intravenous Every 24 hours 12/28/19 1005 12/28/19 1343   12/28/19 1400  linezolid (ZYVOX) IVPB 600 mg        600 mg 300 mL/hr over 60 Minutes Intravenous Every 12 hours 12/28/19 1343 01/06/20 1954   12/26/19 1948  vancomycin variable dose per unstable renal function (pharmacist dosing)  Status:  Discontinued         Does not apply See admin instructions 12/26/19 1948 12/28/19 1005   12/23/19 2000  vancomycin (VANCOCIN) IVPB 1000 mg/200 mL premix  Status:  Discontinued        1,000 mg 200 mL/hr over 60 Minutes Intravenous Every 8 hours 12/23/19 1027 12/26/19 1944   12/23/19 1300  clindamycin (CLEOCIN) IVPB 900 mg  Status:  Discontinued        900 mg 100 mL/hr over 30 Minutes Intravenous Every 8 hours 12/23/19 1205 12/31/19 1505   12/23/19 1200  metroNIDAZOLE (FLAGYL) IVPB 500 mg  Status:  Discontinued        500 mg 100 mL/hr over 60 Minutes Intravenous Every 8 hours 12/23/19 1135 12/23/19 1254   12/23/19 1045  vancomycin (VANCOREADY) IVPB 2000 mg/400 mL         2,000 mg 200 mL/hr over 120 Minutes Intravenous NOW 12/23/19 1018 12/23/19 1441   12/23/19 0300  cefTRIAXone (ROCEPHIN) 2 g in sodium chloride 0.9 % 100 mL IVPB  Status:  Discontinued        2 g 200 mL/hr over 30 Minutes Intravenous Every 24 hours 12/23/19 0236 01/06/20 0859       Objective: Vitals: Today's Vitals   01/29/20 0515 01/29/20 0515 01/29/20 0555 01/29/20 0600  BP:  125/88  Pulse:  (!) 107    Resp:  16    Temp:  (!) 97.5 F (36.4 C)    TempSrc:  Oral    SpO2:  98%    Weight:    79.2 kg  Height:      PainSc: 9   7      Intake/Output Summary (Last 24 hours) at 01/29/2020 1024 Last data filed at 01/29/2020 0900 Gross per 24 hour  Intake 750 ml  Output 650 ml  Net 100 ml   Filed Weights   01/25/20 0500 01/26/20 0500 01/29/20 0600  Weight: 93.3 kg 91.3 kg 79.2 kg   Weight change:    Intake/Output from previous day: 07/14 0701 - 07/15 0700 In: 720 [P.O.:720] Out: 650 [Urine:500; Stool:150] Intake/Output this shift: Total I/O In: 270 [P.O.:240; I.V.:30] Out: -   Examination:  General exam: AAOx3,NAD,weak appearing. HEENT:Oral mucosa moist, Ear/Nose WNL grossly, dentition normal. Respiratory system:Bilaterally clear,no wheezing or crackles,no use of accessory muscle Cardiovascular system:S1 & S2 +, No JVD,. Gastrointestinal system:Abdomen soft, NT,ND, BS+ left colostomy with stool output Nervous System:Alert, awake, moving extremities and grossly nonfocal Extremities: No edema, distal peripheral pulses palpable.  Skin: No rashes,no icterus.  Scaly skin MSK: Normal muscle bulk,tone, power  Foley catheter + PICC line +  Last photograph by surgery   Data Reviewed: I have personally reviewed following labs and imaging studies CBC: Recent Labs  Lab 01/23/20 0441 01/24/20 0420 01/25/20 0452 01/26/20 0428 01/27/20 0426  WBC 11.3* 9.8 12.3* 11.6* 10.0  HGB 7.7* 7.8* 7.6* 7.9* 7.7*  HCT 25.4* 25.0* 24.6* 25.9* 24.7*  MCV 97.3 96.5 96.5  96.3 95.7  PLT 386 378 399 429* 540*   Basic Metabolic Panel: Recent Labs  Lab 01/23/20 0441 01/24/20 0420 01/25/20 0452 01/26/20 0428 01/27/20 0426  NA 135 132* 132* 135 133*  K 4.1 4.0 4.3 3.7 4.4  CL 96* 94* 93* 96* 95*  CO2 26 28 28 29 28   GLUCOSE 142* 143* 156* 128* 126*  BUN 13 15 15 15 16   CREATININE 0.31* <0.30* <0.30* <0.30* <0.30*  CALCIUM 8.9 8.8* 8.8* 9.1 9.2   GFR: CrCl cannot be calculated (This lab value cannot be used to calculate CrCl because it is not a number: <0.30). Liver Function Tests: No results for input(s): AST, ALT, ALKPHOS, BILITOT, PROT, ALBUMIN in the last 168 hours. No results for input(s): LIPASE, AMYLASE in the last 168 hours. No results for input(s): AMMONIA in the last 168 hours. Coagulation Profile: No results for input(s): INR, PROTIME in the last 168 hours. Cardiac Enzymes: No results for input(s): CKTOTAL, CKMB, CKMBINDEX, TROPONINI in the last 168 hours. BNP (last 3 results) No results for input(s): PROBNP in the last 8760 hours. HbA1C: No results for input(s): HGBA1C in the last 72 hours. CBG: Recent Labs  Lab 01/28/20 0015 01/28/20 0618 01/28/20 1141 01/28/20 2131 01/29/20 0602  GLUCAP 99 93 87 89 100*   Lipid Profile: No results for input(s): CHOL, HDL, LDLCALC, TRIG, CHOLHDL, LDLDIRECT in the last 72 hours. Thyroid Function Tests: No results for input(s): TSH, T4TOTAL, FREET4, T3FREE, THYROIDAB in the last 72 hours. Anemia Panel: No results for input(s): VITAMINB12, FOLATE, FERRITIN, TIBC, IRON, RETICCTPCT in the last 72 hours. Sepsis Labs: No results for input(s): PROCALCITON, LATICACIDVEN in the last 168 hours.  No results found for this or any previous visit (from the past 240 hour(s)).    Radiology Studies: No results found.   LOS: 20 days   Antonieta Pert, MD  [facility]  01/29/2020, 10:24 AM

## 2020-01-29 NOTE — Progress Notes (Signed)
Physical Therapy Treatment Patient Details Name: Alyssa Carey MRN: [id number] DOB: [birth year] Today's Date: 01/29/2020    History of Present Illness 49 y/o female with a heavy alcohol history admitted6/8/21  in the setting of confusion and presumed alcohol withdrawal who later developed sepsis from a gas forming wound, S/P debridement left gluteal wound for necratizing fascitis.VDRF,. Extubated 01/07/20., negative for DVT in bilateral legs and PE. Negative DVT Left and right UE    PT Comments    Patient making excellent progress, MS is alert, voice is  audible. Patient participates in therapies without delay. Practiced multiple stand in stedy and x 1 from recliner with feet on floor of which patient was able to perform with 2 assisting. Will attempt WC mobility and stand pivot with RW next visit.   Follow Up Recommendations  SNF     Equipment Recommendations  None recommended by PT    Recommendations for Other Services       Precautions / Restrictions Precautions Precaution Comments: L gluteal wound, colostomy,    Mobility  Bed Mobility   Bed Mobility: Supine to Sit Rolling: Min guard Sidelying to sit: Min guard       General bed mobility comments: pulls legs into flexion  with hands to prepare to roll. moves legs over bed edge and pushes to sit upright, scoots tobed edge  Transfers Overall transfer level: Needs assistance   Transfers: Sit to/from Stand Sit to Stand: Min assist;Mod assist         General transfer comment: min assist to stand from bed at stedy, minguard to stand from stedy x 3, mod assist  of 2 from recliner with feet on floor pulling up on back of stedy. Able to reach 1 arm back to armrest and slowly descentd.  Ambulation/Gait                 Stairs             Wheelchair Mobility    Modified Rankin (Stroke Patients Only)       Balance   Sitting-balance support: No upper extremity supported;Feet supported Sitting  balance-Leahy Scale: Fair     Standing balance support: Bilateral upper extremity supported;During functional activity Standing balance-Leahy Scale: Fair Standing balance comment: reliant on external support of stedy                            Cognition Arousal/Alertness: Awake/alert Behavior During Therapy: WFL for tasks assessed/performed Overall Cognitive Status: Within Functional Limits for tasks assessed                                        Exercises      General Comments        Pertinent Vitals/Pain Faces Pain Scale: Hurts a little bit Pain Location: buttock Pain Descriptors / Indicators: Discomfort Pain Intervention(s): Monitored during session    Home Living                      Prior Function            PT Goals (current goals can now be found in the care plan section) Progress towards PT goals: Progressing toward goals    Frequency    Min 2X/week      PT Plan Current plan remains appropriate    Co-evaluation PT/OT/SLP Co-Evaluation/Treatment:  Yes Reason for Co-Treatment: For patient/therapist safety PT goals addressed during session: Mobility/safety with mobility        AM-PAC PT "6 Clicks" Mobility   Outcome Measure  Help needed turning from your back to your side while in a flat bed without using bedrails?: A Little Help needed moving from lying on your back to sitting on the side of a flat bed without using bedrails?: A Little Help needed moving to and from a bed to a chair (including a wheelchair)?: A Lot Help needed standing up from a chair using your arms (e.g., wheelchair or bedside chair)?: A Lot Help needed to walk in hospital room?: Total Help needed climbing 3-5 steps with a railing? : Total 6 Click Score: 12    End of Session Equipment Utilized During Treatment: Gait belt Activity Tolerance: Patient tolerated treatment well Patient left: in chair;with call bell/phone within reach Nurse  Communication: Mobility status;Need for lift equipment PT Visit Diagnosis: Muscle weakness (generalized) (M62.81);Difficulty in walking, not elsewhere classified (R26.2)     Time: 7867-5449 PT Time Calculation (min) (ACUTE ONLY): 24 min  Charges:  $Therapeutic Activity: 8-22 mins                     Tresa Endo PT Acute Rehabilitation Services Pager [phone] Office [phone]    Claretha Cooper 01/29/2020, 3:08 PM

## 2020-01-29 NOTE — Progress Notes (Signed)
Occupational Therapy Treatment Patient Details Name: Alyssa Carey MRN: [id number] DOB: 12-Nov-1970 Today's Date: 01/29/2020    History of present illness 49 y/o female with a heavy alcohol history admitted6/8/21  in the setting of confusion and presumed alcohol withdrawal who later developed sepsis from a gas forming wound, S/P debridement left gluteal wound for necratizing fascitis.VDRF,. Extubated 01/07/20., negative for DVT in bilateral legs and PE. Negative DVT Left and right UE   OT comments  Patient continues to improve - demonstrating min assist for supine to sit and sit to stand with use of stedy. Patient performed grooming and partial upper body bathing task seated on stedy at sink exhibiting functional upper extremity strength and balance.    Follow Up Recommendations  SNF    Equipment Recommendations  Other (comment)    Recommendations for Other Services      Precautions / Restrictions Precautions Precautions: Fall Precaution Comments: L gluteal wound, colostomy, Restrictions Weight Bearing Restrictions: No       Mobility Bed Mobility Overal bed mobility: Needs Assistance Bed Mobility: Supine to Sit Rolling: Min guard Sidelying to sit: Min guard Supine to sit: Min assist     General bed mobility comments: min assist for lower extremities to transfer to side of bed - use of bed rails and scooting to edge with upper extremities  Transfers Overall transfer level: Needs assistance Equipment used: Ambulation equipment used Transfers: Sit to/from Stand Sit to Stand: Min assist;+2 safety/equipment         General transfer comment: min assist to stand with use of stedy from bed height.    Balance   Sitting-balance support: No upper extremity supported;Feet supported Sitting balance-Leahy Scale: Fair     Standing balance support: Bilateral upper extremity supported;During functional activity Standing balance-Leahy Scale: Fair Standing balance comment:  reliant on external support of stedy                           ADL either performed or assessed with clinical judgement   ADL       Grooming: Wash/dry hands;Wash/dry face;Oral care Grooming Details (indicate cue type and reason): Patient performed grooming task at sink seated on stedy and leaning onto stedy bar. Patient washed face, washed hands, brushed teeth, applied deodorant Upper Body Bathing: Set up;Sitting Upper Body Bathing Details (indicate cue type and reason): Did partial bathing of upper extremeties at sink seated on stedy.                                 Vision       Perception     Praxis      Cognition Arousal/Alertness: Awake/alert Behavior During Therapy: WFL for tasks assessed/performed Overall Cognitive Status: Within Functional Limits for tasks assessed                                          Exercises     Shoulder Instructions       General Comments      Pertinent Vitals/ Pain       Pain Assessment: 0-10 Pain Score: 2  Faces Pain Scale: Hurts a little bit Pain Location: buttock Pain Descriptors / Indicators: Discomfort Pain Intervention(s): Monitored during session  Home Living  Prior Functioning/Environment              Frequency  Min 2X/week        Progress Toward Goals  OT Goals(current goals can now be found in the care plan section)  Progress towards OT goals: Progressing toward goals  Acute Rehab OT Goals Patient Stated Goal: to get out of hospital OT Goal Formulation: With patient Time For Goal Achievement: 02/03/20 Potential to Achieve Goals: Good  Plan Discharge plan remains appropriate    Co-evaluation    PT/OT/SLP Co-Evaluation/Treatment: Yes Reason for Co-Treatment: For patient/therapist safety;To address functional/ADL transfers PT goals addressed during session: Mobility/safety with mobility OT goals  addressed during session: ADL's and self-care      AM-PAC OT "6 Clicks" Daily Activity     Outcome Measure   Help from another person eating meals?: A Little Help from another person taking care of personal grooming?: A Little Help from another person toileting, which includes using toliet, bedpan, or urinal?: A Lot Help from another person bathing (including washing, rinsing, drying)?: A Lot Help from another person to put on and taking off regular upper body clothing?: A Little Help from another person to put on and taking off regular lower body clothing?: Total 6 Click Score: 14    End of Session Equipment Utilized During Treatment: Gait belt  OT Visit Diagnosis: Muscle weakness (generalized) (M62.81)   Activity Tolerance Patient tolerated treatment well   Patient Left in chair;with call bell/phone within reach   Nurse Communication Mobility status        Time: 1430-1453 OT Time Calculation (min): 23 min  Charges: OT General Charges $OT Visit: 1 Visit OT Treatments $Self Care/Home Management : 8-22 mins  Derl Barrow, OTR/L [place]  Office [phone] Pager: [place] 01/29/2020, 4:42 PM

## 2020-01-30 LAB — CBC
HCT: 27.1 % — ABNORMAL LOW (ref 36.0–46.0)
Hemoglobin: 8.2 g/dL — ABNORMAL LOW (ref 12.0–15.0)
MCH: 28.8 pg (ref 26.0–34.0)
MCHC: 30.3 g/dL (ref 30.0–36.0)
MCV: 95.1 fL (ref 80.0–100.0)
Platelets: 557 10*3/uL — ABNORMAL HIGH (ref 150–400)
RBC: 2.85 MIL/uL — ABNORMAL LOW (ref 3.87–5.11)
RDW: 18.7 % — ABNORMAL HIGH (ref 11.5–15.5)
WBC: 9.7 10*3/uL (ref 4.0–10.5)
nRBC: 0 % (ref 0.0–0.2)

## 2020-01-30 LAB — BASIC METABOLIC PANEL
Anion gap: 9 (ref 5–15)
BUN: 13 mg/dL (ref 6–20)
CO2: 27 mmol/L (ref 22–32)
Calcium: 9.4 mg/dL (ref 8.9–10.3)
Chloride: 97 mmol/L — ABNORMAL LOW (ref 98–111)
Creatinine, Ser: <0.3 mg/dL — ABNORMAL LOW (ref 0.44–1.00)
Glucose, Bld: 97 mg/dL (ref 70–99)
Potassium: 3.6 mmol/L (ref 3.5–5.1)
Sodium: 133 mmol/L — ABNORMAL LOW (ref 135–145)

## 2020-01-30 LAB — GLUCOSE, CAPILLARY: Glucose-Capillary: 93 mg/dL (ref 70–99)

## 2020-01-30 NOTE — Progress Notes (Signed)
Physical Therapy Treatment Patient Details Name: Alyssa Carey MRN: [id number] DOB: 05-10-1971 Today's Date: 01/30/2020    History of Present Illness 49 y/o female with a heavy alcohol history admitted6/8/21  in the setting of confusion and presumed alcohol withdrawal who later developed sepsis from a gas forming wound, S/P debridement left gluteal wound for necratizing fascitis.VDRF,. Extubated 01/07/20., negative for DVT in bilateral legs and PE. Negative DVT Left and right UE    PT Comments    Recommend that patient  Be switched to a low air loss mattress(listed in order as "replacement mattress([place] air loss mattress") with the presence of large gluteal wound.Patient was switched to a regular hospital bed from the  Tilt bed that had A LOW AIR LOSS MATTRESS this week.. Messaged the RN.  . The patient is making excellent progress. Assisted into WC using the [place] STEDy( for safety with only 1 therapist to assist), patient stands with min assistance in STEDY.. Patient propelled WC 100' x 2 using UE's. Continue PT, begin gait training with RW next visit.  Follow Up Recommendations  SNF     Equipment Recommendations  None recommended by PT    Recommendations for Other Services       Precautions / Restrictions Precautions Precaution Comments: L gluteal wound, colostomy,    Mobility  Bed Mobility   Bed Mobility: Supine to Sit     Supine to sit: Min guard     General bed mobility comments: encouraged patient to perform, moves legs pulling on yellow socks  Transfers Overall transfer level: Needs assistance   Transfers: Sit to/from Stand Sit to Stand: Min assist    transfe.sign  to WC using sara STEDY/     General transfer comment: min assist from raised bed and stedy  Ambulation/Gait                 Theme park manager mobility: Yes Wheelchair propulsion: Both upper extremities Wheelchair parts:  Needs assistance Distance: 100 x 2 Wheelchair Assistance Details (indicate cue type and reason): cues for brakes,  Modified Rankin (Stroke Patients Only)       Balance   Sitting-balance support: No upper extremity supported;Feet supported Sitting balance-Leahy Scale: Fair                                      Cognition Arousal/Alertness: Awake/alert Behavior During Therapy: WFL for tasks assessed/performed Overall Cognitive Status: Within Functional Limits for tasks assessed                                        Exercises      General Comments        Pertinent Vitals/Pain Faces Pain Scale: Hurts a little bit Pain Location: buttock Pain Descriptors / Indicators: Discomfort Pain Intervention(s): Premedicated before session;Repositioned;Monitored during session (air cushion)    Home Living                      Prior Function            PT Goals (current goals can now be found in the care plan section) Progress towards PT goals: Progressing toward goals    Frequency    Min 2X/week  PT Plan Current plan remains appropriate    Co-evaluation              AM-PAC PT "6 Clicks" Mobility   Outcome Measure  Help needed turning from your back to your side while in a flat bed without using bedrails?: A Little Help needed moving from lying on your back to sitting on the side of a flat bed without using bedrails?: A Little Help needed moving to and from a bed to a chair (including a wheelchair)?: A Lot Help needed standing up from a chair using your arms (e.g., wheelchair or bedside chair)?: A Lot Help needed to walk in hospital room?: Total Help needed climbing 3-5 steps with a railing? : Total 6 Click Score: 12    End of Session Equipment Utilized During Treatment: Gait belt Activity Tolerance: Patient tolerated treatment well Patient left: in chair;with call bell/phone within reach;with family/visitor  present Nurse Communication: Mobility status;Need for lift equipment PT Visit Diagnosis: Muscle weakness (generalized) (M62.81);Difficulty in walking, not elsewhere classified (R26.2)     Time: 1457-1530 PT Time Calculation (min) (ACUTE ONLY): 33 min  Charges:  $Therapeutic Activity: 8-22 mins $Wheel Chair Management: 8-22 mins                     Tresa Endo PT Acute Rehabilitation Services Pager [phone] Office [phone]    Claretha Cooper 01/30/2020, 4:55 PM

## 2020-01-30 NOTE — Progress Notes (Signed)
PROGRESS NOTE    Alyssa Carey  VFI:[id number] DOB: May 13, 1971 DOA: 12/21/2019 PCP: Patient, No Pcp Per   Chef Complaints:: Alcohol withdrawal   Brief Narrative:49 year old female who presented secondary to concern for alcohol withdrawal and quickly developed evidence of necrotizing fasciitis, emergently managed in the OR with critical care, general surgery with I&D.  Patient was in ICU needing intubation and vasopressors for septic shock underwent repeat I&D and diverting colostomy.  She has been extubated and off vasopressors. remains in the stepdown unit.  Wound is slowly healing being managed by surgery wound care and had been on hydrotherapy Monday Wednesday Friday.  Followed by speech therapy oral intake inadequate on modified diet and on NG tube feeding. Patient is slowly improving. She had evidence of fluid overload with weight increased up to 260 pound from admission weight of 193-212 lb-placed on IV Lasix with good output and improving weight and switched to oral Lasix, wt down to 220 .   Significant events 6/06 admission 6/08 ICU admission to OR ofr I&D, back intubated and on pressors, 6/09 started tube feeds. Still pressor dependent,kept on vent w/ anticipated return to OR planned  6/10 Back to the OR for significant debridement and also placement of diverting colostomy 6/11 Sedated, low-dose phenylephrine. CCS felt wound looking good. Sedation changed to precedex / PRN fent  6/12 more awake, on 15/5 pressure support 6/13 family discussion >> DNR, continue medical care 6//15 transfuse 1 unit PRBC; vomiting, tube feeds held 6/16 Vomiting overnight 6/17 Trial PSV 15/5 with variable Vt, remains on vasopressors. Vomiting overnight  6/18 transfuse 1 unit PRBC 6/20 Off pressors, On precedex and weaning on PSV  6/21 Weaning on PSV, planned hydrotherapy 6/23 Extubated 6/24 Transferred to [facility] service. 01/22/20- Tx out of SDU Patient had evidence of fluid overload with weight gain of  2 to 60 pounds from baseline around 200s diarrhea.  IV Lasix with a significantly improved , lasix stopped 7/12. 7/12: Surgery on eval satisfied with her wound healing and will continue twice daily WD dressing and signed off. 7/13-doing well with this.  Starting on dysphagia 3 thin liquid diet and NG tube discontinued  Subjective:  Resting comfortably.  Reports not been able to sleep as nursing coming to check on her during night.   No new complaints no nausea vomiting chest pain shortness of breath fever or chills.     Assessment & Plan:  Necrotizing fasciitis of pelvic region and thigh: Patient had emergent operative intervention with incision and debridement, diverting colostomy and had multiple rounds of IV antibiotics (last dose 6/22)-and her wound culture grew E. coli and VRE.  Patient on hydrotherapy and has completed.  Per surgery continue twice daily WD dressing and follow-up with wound care as outpatient.  Foley in place to promote healing.   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  Appreciate speech eval patient is now on dysphagia 3 thin liquid diet, off NG tube.  Tolerating well.   Severe protein calorie malnutrition with low albumin, anasarca with poor nutritional status due to severe illness: Continue dysphagia diet as per speech.  Nutrition supplement thiamine folate.  Continue to augment nutrition.  Fluid overload/anasarca: resolved. Off lasix 7/12. On admission weight was 193 to 213 pounds,  went up to 260 lb and patient was diuresed. Wt down to 174 lb.  Pressure injury/DTI of right ear not present on admission.  Continue local supportive care and dressing.  AKI with unknown baseline 1.7 upon  admission: Renal failure has resolved.  Hyperglycemia while on tube feeding.  Discontinued her Lantus.  Can discontinue her Accu-Chek.  Anemia of critical illness/acute blood loss anemia Baseline hemoglobin 12-13 developed anemia in the setting of infection critical illness bleeding.patient was seen by GI for rectal bleeding underwent sigmoidoscopy flexible  Showed-10 cm Stercoral ulcer 6/28 received 3 units PRBC.  Hemoglobin today at 8.2 g.  Check intermittently.   Recent Labs  Lab 01/24/20 0420 01/25/20 0452 01/26/20 0428 01/27/20 0426 01/30/20 0326  HGB 7.8* 7.6* 7.9* 7.7* 8.2*  HCT 25.0* 24.6* 25.9* 24.7* 27.1*   Morbid obesity with BMI 43.9.  Loss weight with diuresis, BMI down to 29.  Encourage healthy lifestyle.   Sinus tachycardia: HR at times 100-120s and this has been making her MEWS red   She has had extensive evaluation negative for DVT in lower extremities and upper extremities.  She had CT of the chest negative for PE.  TSH is normal.  She is on metoprolol 25 twice daily can increase as tolerated by blood pressure.  Patient reports she does have baseline tachycardia at home.    Erythema generalized from chemical wipe- resolved.patient's family has been educated on not to do anything without asking patient's nurses. Leukocytosis:Leukocytosis resolved.   Hypokalemia/hypomagnesemia: stable, off kcl po Septic shock secondary to #1- resolved Acute hypoxic respiratory failure:resolved   DVT prophylaxis: enoxaparin (LOVENOX) injection 40 mg Start: 01/26/20 1600 Place and maintain sequential compression device Start: 01/10/20 1738. Resumed lovenox  7/12 as no more bleeding and she remains at high risk for VTE.  If she rebleeds from the wound will need to stop it Code Status: full Family Communication: plan of care discussed with patient and nursing staffs at bedside.  Status is: Inpatient  Remains inpatient appropriate because of unsafe disposition.continue on SNF  placement.  Dispo: The patient is from: Home              Anticipated d/c is to: Skilled nursing facility..              Anticipated d/c date is: Once bed available              Patient currently is medically stable.at this time she is awaiting for placement.    Nutrition: Nutrition and speech are following. Diet Order            DIET DYS 3 Room service appropriate? Yes; Fluid consistency: Thin  Diet effective now                 Nutrition Problem: Increased nutrient needs Etiology: chronic illness, wound healing (alcohol abuse; necrotizing fasciitis) Signs/Symptoms: estimated needs Interventions: Prostat, Juven, MVI, Other (Comment) [employer]) Body mass index is 29.97 kg/m. Pressure Ulcer: Pressure Injury 12/27/19 Ear Right Stage 3 -  Full thickness tissue loss. Subcutaneous fat may be visible but bone, tendon or muscle are NOT exposed. previously noted unstageable wound has evolved into a healing stage 3 when assessed on 7/5 (Active)  12/27/19 1200  Location: Ear  Location Orientation: Right  Staging: Stage 3 -  Full thickness tissue loss. Subcutaneous fat may be visible but bone, tendon or muscle are NOT exposed.  Wound Description (Comments): previously noted unstageable wound has evolved into a healing stage 3 when  assessed on 7/5  Present on Admission: No   Consultants:see note  Procedures:  ENDOTRACHEAL INTUBATION (6/8 >> 01/07/2020)  INCISION AND DEBRIDEMENT OF LEFT BUTTOCK AND PERINEUM (12/23/2019)  IRRIGATION AND DEBRIDEMENT OF BUTTOCKS; LAPAROSCOPIC LOOP COLOSTOMY (12/25/2019)  FLEXIBLE SIGMOIDOSCOPY (01/11/2020)   Microbiology:see note  Medications: Scheduled Meds: . ascorbic acid  500 mg Oral BID  . Chlorhexidine Gluconate Cloth  6 each Topical Daily  . collagenase   Topical Daily  . enoxaparin (LOVENOX) injection  40 mg Subcutaneous Q24H  . feeding supplement (KATE FARMS STANDARD 1.4)  325 mL Oral BID BM  . feeding supplement (PRO-STAT SUGAR FREE 64)  30  mL Oral BID BM  . ferrous sulfate  325 mg Oral BID WC  . folic acid  1 mg Oral Daily  . mouth rinse  15 mL Mouth Rinse BID  . melatonin  3 mg Oral QHS  . metoprolol tartrate  25 mg Oral BID  . mineral oil-hydrophilic petrolatum   Topical TID  . multivitamin with minerals  1 tablet Oral Daily  . nutrition supplement (JUVEN)  1 packet Oral BID BM  . pantoprazole  40 mg Oral Daily  . sodium chloride flush  10-40 mL Intracatheter Q12H  . thiamine  100 mg Oral Daily  . traZODone  100 mg Oral QHS  . Vitamin D (Ergocalciferol)  50,000 Units Oral Q Fri  . zinc sulfate  220 mg Oral Daily   Continuous Infusions: . sodium chloride      Antimicrobials:  Ceftriaxone  Vancomycin  Clindamycin  Linezolid  Flagyl  Finished all antibiotic therapy. Anti-infectives (From admission, onward)   Start     Dose/Rate Route Frequency Ordered Stop   12/31/19 1600  metroNIDAZOLE (FLAGYL) IVPB 500 mg  Status:  Discontinued        500 mg 100 mL/hr over 60 Minutes Intravenous Every 8 hours 12/31/19 1505 01/06/20 0859   12/28/19 1400  vancomycin (VANCOCIN) IVPB 1000 mg/200 mL premix  Status:  Discontinued        1,000 mg 200 mL/hr over 60 Minutes Intravenous Every 24 hours 12/28/19 1005 12/28/19 1343   12/28/19 1400  linezolid (ZYVOX) IVPB 600 mg        600 mg 300 mL/hr over 60 Minutes Intravenous Every 12 hours 12/28/19 1343 01/06/20 1954   12/26/19 1948  vancomycin variable dose per unstable renal function (pharmacist dosing)  Status:  Discontinued         Does not apply See admin instructions 12/26/19 1948 12/28/19 1005   12/23/19 2000  vancomycin (VANCOCIN) IVPB 1000 mg/200 mL premix  Status:  Discontinued        1,000 mg 200 mL/hr over 60 Minutes Intravenous Every 8 hours 12/23/19 1027 12/26/19 1944   12/23/19 1300  clindamycin (CLEOCIN) IVPB 900 mg  Status:  Discontinued        900 mg 100 mL/hr over 30 Minutes Intravenous Every 8 hours 12/23/19 1205 12/31/19 1505   12/23/19 1200   metroNIDAZOLE (FLAGYL) IVPB 500 mg  Status:  Discontinued        500 mg 100 mL/hr over 60 Minutes Intravenous Every 8 hours 12/23/19 1135 12/23/19 1254   12/23/19 1045  vancomycin (VANCOREADY) IVPB 2000 mg/400 mL        2,000 mg 200 mL/hr over 120 Minutes Intravenous NOW 12/23/19 1018 12/23/19 1441   12/23/19 0300  cefTRIAXone (ROCEPHIN) 2 g in sodium chloride 0.9 % 100 mL IVPB  Status:  Discontinued  2 g 200 mL/hr over 30 Minutes Intravenous Every 24 hours 12/23/19 0236 01/06/20 0859       Objective: Vitals: Today's Vitals   01/29/20 2252 01/30/20 0111 01/30/20 0636 01/30/20 0757  BP: 105/74     Pulse: 98     Resp: 18     Temp: 98.2 F (36.8 C)     TempSrc: Oral     SpO2: 97%     Weight:      Height:      PainSc:  0-No pain Asleep Asleep    Intake/Output Summary (Last 24 hours) at 01/30/2020 1137 Last data filed at 01/30/2020 0328 Gross per 24 hour  Intake 280 ml  Output 1525 ml  Net -1245 ml   Filed Weights   01/25/20 0500 01/26/20 0500 01/29/20 0600  Weight: 93.3 kg 91.3 kg 79.2 kg   Weight change:    Intake/Output from previous day: 07/15 0701 - 07/16 0700 In: 550 [P.O.:480; I.V.:70] Out: 1525 [Urine:1450; Stool:75] Intake/Output this shift: No intake/output data recorded.  Examination:  General exam: AAOx3 , NAD, weak appearing. HEENT:Oral mucosa moist, Ear/Nose WNL grossly, dentition normal. Respiratory system: bilaterally clear, no wheezing or crackles,no use of accessory muscle Cardiovascular system: S1 & S2 +, No JVD,. Gastrointestinal system: Abdomen soft, NT,ND, BS+.  Left colostomy bag with stool intact Nervous System:Alert, awake, moving extremities and grossly nonfocal Extremities: Mild non pitting leg edema, distal peripheral pulses palpable.  Skin: No rashes,no icterus. MSK: Normal muscle bulk,tone, power  Foley catheter + PICC line +  Last photograph by surgery   Data Reviewed: I have personally reviewed following labs and  imaging studies CBC: Recent Labs  Lab 01/24/20 0420 01/25/20 0452 01/26/20 0428 01/27/20 0426 01/30/20 0326  WBC 9.8 12.3* 11.6* 10.0 9.7  HGB 7.8* 7.6* 7.9* 7.7* 8.2*  HCT 25.0* 24.6* 25.9* 24.7* 27.1*  MCV 96.5 96.5 96.3 95.7 95.1  PLT 378 399 429* 455* 330*   Basic Metabolic Panel: Recent Labs  Lab 01/24/20 0420 01/25/20 0452 01/26/20 0428 01/27/20 0426 01/30/20 0326  NA 132* 132* 135 133* 133*  K 4.0 4.3 3.7 4.4 3.6  CL 94* 93* 96* 95* 97*  CO2 28 28 29 28 27   GLUCOSE 143* 156* 128* 126* 97  BUN 15 15 15 16 13   CREATININE <0.30* <0.30* <0.30* <0.30* <0.30*  CALCIUM 8.8* 8.8* 9.1 9.2 9.4   GFR: CrCl cannot be calculated (This lab value cannot be used to calculate CrCl because it is not a number: <0.30). Liver Function Tests: No results for input(s): AST, ALT, ALKPHOS, BILITOT, PROT, ALBUMIN in the last 168 hours. No results for input(s): LIPASE, AMYLASE in the last 168 hours. No results for input(s): AMMONIA in the last 168 hours. Coagulation Profile: No results for input(s): INR, PROTIME in the last 168 hours. Cardiac Enzymes: No results for input(s): CKTOTAL, CKMB, CKMBINDEX, TROPONINI in the last 168 hours. BNP (last 3 results) No results for input(s): PROBNP in the last 8760 hours. HbA1C: No results for input(s): HGBA1C in the last 72 hours. CBG: Recent Labs  Lab 01/28/20 1141 01/28/20 2131 01/29/20 0602 01/29/20 1814 01/30/20 0655  GLUCAP 87 89 100* 127* 93   Lipid Profile: No results for input(s): CHOL, HDL, LDLCALC, TRIG, CHOLHDL, LDLDIRECT in the last 72 hours. Thyroid Function Tests: No results for input(s): TSH, T4TOTAL, FREET4, T3FREE, THYROIDAB in the last 72 hours. Anemia Panel: No results for input(s): VITAMINB12, FOLATE, FERRITIN, TIBC, IRON, RETICCTPCT in the last 72 hours. Sepsis Labs: No  results for input(s): PROCALCITON, LATICACIDVEN in the last 168 hours.  No results found for this or any previous visit (from the past 240  hour(s)).    Radiology Studies: No results found.   LOS: 40 days   Antonieta Pert, MD [facility]  01/30/2020, 11:37 AM

## 2020-01-31 NOTE — Progress Notes (Signed)
Resumed care for this pt at 2300. I agree with previous RNs assessment. Pt resting in bed at this time.

## 2020-01-31 NOTE — Progress Notes (Signed)
PROGRESS NOTE    Alyssa Carey  LGX:[id number] DOB: March 02, 1971 DOA: 12/21/2019 PCP: Patient, No Pcp Per   Chef Complaints:: Alcohol withdrawal   Brief Narrative:49 year old female who presented secondary to concern for alcohol withdrawal and quickly developed evidence of necrotizing fasciitis, emergently managed in the OR with critical care, general surgery with I&D.  Patient was in ICU needing intubation and vasopressors for septic shock underwent repeat I&D and diverting colostomy.  She has been extubated and off vasopressors. remains in the stepdown unit.  Wound is slowly healing being managed by surgery wound care and had been on hydrotherapy Monday Wednesday Friday.  Followed by speech therapy oral intake inadequate on modified diet and on NG tube feeding. Patient is slowly improving. She had evidence of fluid overload with weight increased up to 260 pound from admission weight of 193-212 lb-placed on IV Lasix with good output and improving weight and switched to oral Lasix, wt down to 220 .   Significant events 6/06 admission 6/08 ICU admission to OR ofr I&D, back intubated and on pressors, 6/09 started tube feeds. Still pressor dependent,kept on vent w/ anticipated return to OR planned  6/10 Back to the OR for significant debridement and also placement of diverting colostomy 6/11 Sedated, low-dose phenylephrine. CCS felt wound looking good. Sedation changed to precedex / PRN fent  6/12 more awake, on 15/5 pressure support 6/13 family discussion >> DNR, continue medical care 6//15 transfuse 1 unit PRBC; vomiting, tube feeds held 6/16 Vomiting overnight 6/17 Trial PSV 15/5 with variable Vt, remains on vasopressors. Vomiting overnight  6/18 transfuse 1 unit PRBC 6/20 Off pressors, On precedex and weaning on PSV  6/21 Weaning on PSV, planned hydrotherapy 6/23 Extubated 6/24 Transferred to [facility] service. 01/22/20- Tx out of SDU Patient had evidence of fluid overload with weight gain of  2 to 60 pounds from baseline around 200s diarrhea.  IV Lasix with a significantly improved , lasix stopped 7/12. 7/12: Surgery on eval satisfied with her wound healing and will continue twice daily WD dressing and signed off. 7/13-doing well with this.  Starting on dysphagia 3 thin liquid diet and NG tube discontinued 7/16 PICC line LUE removed.  Subjective:  No new complaints.  Resting comfortably.  Has baseline tachycardia no chest pain nausea vomiting fever chills.  Assessment & Plan:  Necrotizing fasciitis of pelvic region and thigh: Patient had emergent operative intervention with incision and debridement, diverting colostomy and had multiple rounds of IV antibiotics (last dose 6/22)-and her wound culture grew E. coli and VRE.  Remains stable wound is healing nicely continue wound care completed hydrotherapy. Cont BI WD dressing and wound care f/u with Dr Heber Marrowbone as op.  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  Appreciate speech eval patient is now on dysphagia 3 thin liquid diet, off NG tube.  Tolerating well.   Severe protein calorie malnutrition with low albumin, anasarca with poor nutritional status due to severe illness: Continue dysphagia diet as per speech.  Nutrition supplement thiamine folate.  Continue to augment nutrition.  Fluid overload/anasarca: resolved. Off lasix 7/12. On admission weight was 193 to 213 pounds, went up to 260 lb and patient was diuresed. Wt down to 174 lb.  Pressure injury/DTI of right ear  not present on admission.  Continue local supportive care and dressing.  AKI with unknown baseline 1.7 upon admission: Renal failure has resolved.  Hyperglycemia while on tube feeding.   Discontinued her Lantus.  Can discontinue her Accu-Chek.  Anemia of critical illness/acute blood loss anemia Baseline hemoglobin 12-13 developed anemia in the setting of infection critical illness bleeding.patient was seen by GI for rectal bleeding underwent sigmoidoscopy flexible  Showed-10 cm Stercoral ulcer 6/28 received 3 units PRBC.  Hemoglobin today at 8.2 g.  Check intermittently.   Recent Labs  Lab 01/25/20 0452 01/26/20 0428 01/27/20 0426 01/30/20 0326  HGB 7.6* 7.9* 7.7* 8.2*  HCT 24.6* 25.9* 24.7* 27.1*   Morbid obesity with BMI 43.9.  Loss weight with diuresis, BMI down to 29.  Encourage healthy lifestyle.   Sinus tachycardia: HR at times 100-120s and this has been making her MEWS red   She has had extensive evaluation negative for DVT in lower extremities and upper extremities.  She had CT of the chest negative for PE.  TSH is normal.  She is on metoprolol 25 twice daily can increase as tolerated by blood pressure.  Patient reports she does have baseline tachycardia at home.    Erythema generalized from chemical wipe- resolved.patient's family has been educated on not to do anything without asking patient's nurses. Leukocytosis:Leukocytosis resolved.   Hypokalemia/hypomagnesemia: stable, off kcl po Septic shock secondary to #1- resolved Acute hypoxic respiratory failure:resolved   DVT prophylaxis: enoxaparin (LOVENOX) injection 40 mg Start: 01/26/20 1600 Place and maintain sequential compression device Start: 01/10/20 1738. Resumed lovenox  7/12 as no more bleeding and she remains at high risk for VTE.  If she rebleeds from the wound will need to stop it Code Status: full Family Communication: plan of care discussed with patient and nursing staffs at bedside.  Status is: Inpatient  Remains inpatient appropriate because of unsafe disposition.continue on SNF placement.  Dispo: The patient is from: Home              Anticipated d/c is to: Skilled nursing facility..               Anticipated d/c date is: Medically stable,awaiting bed              Patient currently is medically stable.at this time she is awaiting for placement.    Nutrition: Nutrition and speech are following. Diet Order            DIET DYS 3 Room service appropriate? Yes; Fluid consistency: Thin  Diet effective now                 Nutrition Problem: Increased nutrient needs Etiology: chronic illness, wound healing (alcohol abuse; necrotizing fasciitis) Signs/Symptoms: estimated needs Interventions: Prostat, Juven, MVI, Other (Comment) [employer]) Body mass index is 29.87 kg/m. Pressure Ulcer: Pressure Injury 12/27/19 Ear Right Stage 3 -  Full thickness tissue loss. Subcutaneous fat may be visible but bone, tendon or muscle are NOT exposed. previously noted unstageable wound has evolved into a healing stage 3 when assessed on 7/5 (Active)  12/27/19 1200  Location: Ear  Location Orientation: Right  Staging: Stage 3 -  Full thickness tissue loss. Subcutaneous fat may be visible but bone, tendon or muscle are NOT exposed.  Wound Description (Comments): previously noted unstageable wound has evolved into a healing stage 3 when assessed on 7/5  Present on Admission: No   Consultants:see note  Procedures:  ENDOTRACHEAL INTUBATION (6/8 >> 01/07/2020)  INCISION AND DEBRIDEMENT  OF LEFT BUTTOCK AND PERINEUM (12/23/2019)  IRRIGATION AND DEBRIDEMENT OF BUTTOCKS; LAPAROSCOPIC LOOP COLOSTOMY (12/25/2019)  FLEXIBLE SIGMOIDOSCOPY (01/11/2020)   Microbiology:see note  Medications: Scheduled Meds: . ascorbic acid  500 mg Oral BID  . Chlorhexidine Gluconate Cloth  6 each Topical Daily  . collagenase   Topical Daily  . enoxaparin (LOVENOX) injection  40 mg Subcutaneous Q24H  . feeding supplement (KATE FARMS STANDARD 1.4)  325 mL Oral BID BM  . feeding supplement (PRO-STAT SUGAR FREE 64)  30 mL Oral BID BM  . ferrous sulfate  325 mg Oral BID WC  . folic acid  1 mg Oral Daily  . mouth rinse  15  mL Mouth Rinse BID  . melatonin  3 mg Oral QHS  . metoprolol tartrate  25 mg Oral BID  . mineral oil-hydrophilic petrolatum   Topical TID  . multivitamin with minerals  1 tablet Oral Daily  . nutrition supplement (JUVEN)  1 packet Oral BID BM  . pantoprazole  40 mg Oral Daily  . thiamine  100 mg Oral Daily  . traZODone  100 mg Oral QHS  . Vitamin D (Ergocalciferol)  50,000 Units Oral Q Fri  . zinc sulfate  220 mg Oral Daily   Continuous Infusions: . sodium chloride      Antimicrobials:  Ceftriaxone  Vancomycin  Clindamycin  Linezolid  Flagyl  Finished all antibiotic therapy. Anti-infectives (From admission, onward)   Start     Dose/Rate Route Frequency Ordered Stop   12/31/19 1600  metroNIDAZOLE (FLAGYL) IVPB 500 mg  Status:  Discontinued        500 mg 100 mL/hr over 60 Minutes Intravenous Every 8 hours 12/31/19 1505 01/06/20 0859   12/28/19 1400  vancomycin (VANCOCIN) IVPB 1000 mg/200 mL premix  Status:  Discontinued        1,000 mg 200 mL/hr over 60 Minutes Intravenous Every 24 hours 12/28/19 1005 12/28/19 1343   12/28/19 1400  linezolid (ZYVOX) IVPB 600 mg        600 mg 300 mL/hr over 60 Minutes Intravenous Every 12 hours 12/28/19 1343 01/06/20 1954   12/26/19 1948  vancomycin variable dose per unstable renal function (pharmacist dosing)  Status:  Discontinued         Does not apply See admin instructions 12/26/19 1948 12/28/19 1005   12/23/19 2000  vancomycin (VANCOCIN) IVPB 1000 mg/200 mL premix  Status:  Discontinued        1,000 mg 200 mL/hr over 60 Minutes Intravenous Every 8 hours 12/23/19 1027 12/26/19 1944   12/23/19 1300  clindamycin (CLEOCIN) IVPB 900 mg  Status:  Discontinued        900 mg 100 mL/hr over 30 Minutes Intravenous Every 8 hours 12/23/19 1205 12/31/19 1505   12/23/19 1200  metroNIDAZOLE (FLAGYL) IVPB 500 mg  Status:  Discontinued        500 mg 100 mL/hr over 60 Minutes Intravenous Every 8 hours 12/23/19 1135 12/23/19 1254   12/23/19 1045   vancomycin (VANCOREADY) IVPB 2000 mg/400 mL        2,000 mg 200 mL/hr over 120 Minutes Intravenous NOW 12/23/19 1018 12/23/19 1441   12/23/19 0300  cefTRIAXone (ROCEPHIN) 2 g in sodium chloride 0.9 % 100 mL IVPB  Status:  Discontinued        2 g 200 mL/hr over 30 Minutes Intravenous Every 24 hours 12/23/19 0236 01/06/20 0859       Objective: Vitals: Today's Vitals   01/31/20 0634 01/31/20 1000 01/31/20  1100 01/31/20 1230  BP: 109/60   114/79  Pulse: 95   91  Resp: 20   (!) 22  Temp: 98.3 F (36.8 C)   98.4 F (36.9 C)  TempSrc: Oral   Oral  SpO2: 100%   100%  Weight:      Height:      PainSc:  7  2      Intake/Output Summary (Last 24 hours) at 01/31/2020 1431 Last data filed at 01/31/2020 0800 Gross per 24 hour  Intake 240 ml  Output 1725 ml  Net -1485 ml   Filed Weights   01/26/20 0500 01/29/20 0600 01/31/20 0628  Weight: 91.3 kg 79.2 kg 78.9 kg   Weight change:    Intake/Output from previous day: 07/16 0701 - 07/17 0700 In: -  Out: 2094 [Urine:1450; Stool:275] Intake/Output this shift: Total I/O In: 240 [P.O.:240] Out: -   Examination:  General exam: AAOx3 , NAD, weak appearing. HEENT:Oral mucosa moist, Ear/Nose WNL grossly, dentition normal. Respiratory system: bilaterally clear,no wheezing or crackles,no use of accessory muscle Cardiovascular system: S1 & S2 +, No JVD,. Gastrointestinal system: Abdomen soft, NT,ND, BS+ Nervous System:Alert, awake, moving extremities and grossly nonfocal Extremities: Mild nonpitting  LE edema, distal peripheral pulses palpable.  Skin: No rashes,no icterus. MSK: Normal muscle bulk,tone, power Foley catheter +  Last photograph by surgery   Data Reviewed: I have personally reviewed following labs and imaging studies CBC: Recent Labs  Lab 01/25/20 0452 01/26/20 0428 01/27/20 0426 01/30/20 0326  WBC 12.3* 11.6* 10.0 9.7  HGB 7.6* 7.9* 7.7* 8.2*  HCT 24.6* 25.9* 24.7* 27.1*  MCV 96.5 96.3 95.7 95.1  PLT 399 429*  455* 709*   Basic Metabolic Panel: Recent Labs  Lab 01/25/20 0452 01/26/20 0428 01/27/20 0426 01/30/20 0326  NA 132* 135 133* 133*  K 4.3 3.7 4.4 3.6  CL 93* 96* 95* 97*  CO2 28 29 28 27   GLUCOSE 156* 128* 126* 97  BUN 15 15 16 13   CREATININE <0.30* <0.30* <0.30* <0.30*  CALCIUM 8.8* 9.1 9.2 9.4   GFR: CrCl cannot be calculated (This lab value cannot be used to calculate CrCl because it is not a number: <0.30). Liver Function Tests: No results for input(s): AST, ALT, ALKPHOS, BILITOT, PROT, ALBUMIN in the last 168 hours. No results for input(s): LIPASE, AMYLASE in the last 168 hours. No results for input(s): AMMONIA in the last 168 hours. Coagulation Profile: No results for input(s): INR, PROTIME in the last 168 hours. Cardiac Enzymes: No results for input(s): CKTOTAL, CKMB, CKMBINDEX, TROPONINI in the last 168 hours. BNP (last 3 results) No results for input(s): PROBNP in the last 8760 hours. HbA1C: No results for input(s): HGBA1C in the last 72 hours. CBG: Recent Labs  Lab 01/28/20 1141 01/28/20 2131 01/29/20 0602 01/29/20 1814 01/30/20 0655  GLUCAP 87 89 100* 127* 93   Lipid Profile: No results for input(s): CHOL, HDL, LDLCALC, TRIG, CHOLHDL, LDLDIRECT in the last 72 hours. Thyroid Function Tests: No results for input(s): TSH, T4TOTAL, FREET4, T3FREE, THYROIDAB in the last 72 hours. Anemia Panel: No results for input(s): VITAMINB12, FOLATE, FERRITIN, TIBC, IRON, RETICCTPCT in the last 72 hours. Sepsis Labs: No results for input(s): PROCALCITON, LATICACIDVEN in the last 168 hours.  No results found for this or any previous visit (from the past 240 hour(s)).    Radiology Studies: No results found.   LOS: 56 days   Antonieta Pert, MD [facility]  01/31/2020, 2:31 PM

## 2020-02-01 MED ORDER — OXYBUTYNIN CHLORIDE 5 MG PO TABS
2.5000 mg | ORAL_TABLET | Freq: Three times a day (TID) | ORAL | Status: DC | PRN
  Administered 2020-02-01: 2.5 mg via ORAL
  Filled 2020-02-01: qty 1

## 2020-02-01 NOTE — Progress Notes (Addendum)
PROGRESS NOTE    Alyssa Carey  XAJ:[id number] DOB: Feb 16, 1971 DOA: 12/21/2019 PCP: Patient, No Pcp Per   Chef Complaints:: Alcohol withdrawal   Brief Narrative:49 year old female who presented secondary to concern for alcohol withdrawal and quickly developed evidence of necrotizing fasciitis, emergently managed in the OR with critical care, general surgery with I&D.  Patient was in ICU needing intubation and vasopressors for septic shock underwent repeat I&D and diverting colostomy.  She has been extubated and off vasopressors. remains in the stepdown unit.  Wound is slowly healing being managed by surgery wound care and had been on hydrotherapy Monday Wednesday Friday.  Followed by speech therapy oral intake inadequate on modified diet and on NG tube feeding. Patient is slowly improving. She had evidence of fluid overload with weight increased up to 260 pound from admission weight of 193-212 lb-placed on IV Lasix with good output and improving weight and switched to oral Lasix, wt down to 220 .   Significant events 6/06 admission 6/08 ICU admission to OR for I&D, back intubated and on pressors, 6/09 started tube feeds. Still pressor dependent,kept on vent w/ anticipated return to OR planned  6/10 Back to the OR for significant debridement and also placement of diverting colostomy 6/11 Sedated, low-dose phenylephrine. CCS felt wound looking good. Sedation changed to precedex / PRN fent  6/12 more awake, on 15/5 pressure support 6/13 family discussion >> DNR, continue medical care 6//15 transfuse 1 unit PRBC; vomiting, tube feeds held 6/16 Vomiting overnight 6/17 Trial PSV 15/5 with variable Vt, remains on vasopressors. Vomiting overnight  6/18 transfuse 1 unit PRBC 6/20 Off pressors, On precedex and weaning on PSV  6/21 Weaning on PSV, planned hydrotherapy 6/23 Extubated 6/24 Transferred to [facility] service. 01/22/20- Tx out of SDU Patient had evidence of fluid overload with weight gain of  2 to 60 pounds from baseline around 200s diarrhea.  IV Lasix with a significantly improved , lasix stopped 7/12. 7/12: Surgery on eval satisfied with her wound healing and will continue twice daily WD dressing and signed off. 7/13-doing well with this.  Starting on dysphagia 3 thin liquid diet and NG tube discontinued 7/16 PICC line LUE removed. 7/18- d/c foley  Subjective:  Complains of bladder spasm from Foley catheter.  No other new complaints.  Resting comfortably.  Assessment & Plan:  Necrotizing fasciitis of pelvic region and thigh: Patient had emergent operative intervention with incision and debridement, diverting colostomy and had multiple rounds of IV antibiotics (last dose 6/22)-and her wound culture grew E. coli and VRE.  Remains stable wound is healing nicely continue wound care as per surgery with twice daily debility dressing, and follow-up with Dr. Heber [place] as outpatient.  Discussed with Dr. Kieth Brightly and since patient is much more mobile okay to remove the Foley.    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  Appreciate speech eval patient is now on dysphagia 3 thin liquid diet, off NG tube.  Tolerating well.   Severe protein calorie malnutrition with low albumin, anasarca with poor nutritional status due to severe illness: Continue to augment her nutrition.Appreciate dietitian input.Continue nutrition supplement.  Fluid overload/anasarca: resolved. Diuresed with improvement in her swelling and wt. Off lasix 7/12. On admission weight was 193 to 213  pounds, went up to 254-260 lb.Wt down to 174 lb-> 142 lb 7/18.  Augment nutrition.  Pressure injury/DTI of right ear not present on admission.  Continue  local supportive care and dressing.  AKI with unknown baseline 1.7 upon admission: Renal failure has resolved.  Hyperglycemia while on tube feeding.  Discontinued her Lantus.  Can discontinue her Accu-Chek.  Anemia of critical illness/acute blood loss anemia Baseline hemoglobin 12-13 developed anemia in the setting of infection critical illness bleeding.patient was seen by GI for rectal bleeding underwent sigmoidoscopy flexible  Showed-10 cm Stercoral ulcer 6/28 received 3 units PRBC.  Hemoglobin 8.2 on last check.Check intermittently.   Recent Labs  Lab 01/26/20 0428 01/27/20 0426 01/30/20 0326  HGB 7.9* 7.7* 8.2*  HCT 25.9* 24.7* 27.1*   Morbid obesity with BMI 43.9 at one point suspect from fluids. With diuresis-BMI down to 24.   Sinus tachycardia: HR at times 100-120s and this has been making her MEWS red   She has had extensive evaluation negative for DVT in lower extremities and upper extremities.  She had CT of the chest negative for PE.  TSH is normal.  Continue metoprolol 25 mg twice daily, blood pressure doing well tolerating.  Heart rate is improved to 90s.Patient reports she did have baseline tachycardia at home.    Erythema generalized from chemical wipe- resolved.patient's family has been educated on not to do anything without asking patient's nurses. Leukocytosis:Leukocytosis resolved.   Hypokalemia/hypomagnesemia: stable, off kcl po Septic shock secondary to #1- resolved Acute hypoxic respiratory failure:resolved   DVT prophylaxis: enoxaparin (LOVENOX) injection 40 mg Start: 01/26/20 1600 Place and maintain sequential compression device Start: 01/10/20 1738. Resumed lovenox  7/12 as no more bleeding and she remains at high risk for VTE.  If she rebleeds from the wound will need to stop it Code Status: full Family Communication: plan of care discussed with patient and nursing staffs at bedside.  Status is: Inpatient  Remains inpatient appropriate because of unsafe  disposition.continue on SNF placement.  Dispo: The patient is from: Home              Anticipated d/c is to: Skilled nursing facility..              Anticipated d/c date is: Medically stable,awaiting bed              Patient currently is medically stable.at this time she is awaiting for placement.    Nutrition: Nutrition and speech are following. Diet Order            DIET DYS 3 Room service appropriate? Yes; Fluid consistency: Thin  Diet effective now                 Nutrition Problem: Increased nutrient needs Etiology: chronic illness, wound healing (alcohol abuse; necrotizing fasciitis) Signs/Symptoms: estimated needs Interventions: Prostat, Juven, MVI, Other (Comment) [employer]) Body mass index is 24.41 kg/m. Pressure Ulcer: Pressure Injury 12/27/19 Ear Right Stage 3 -  Full thickness tissue loss. Subcutaneous fat may be visible but bone, tendon or muscle are NOT exposed. previously noted unstageable wound has evolved into a healing stage 3 when assessed on 7/5 (Active)  12/27/19 1200  Location: Ear  Location Orientation: Right  Staging: Stage 3 -  Full thickness tissue loss. Subcutaneous fat may be visible but bone, tendon or muscle are NOT exposed.  Wound Description (Comments): previously noted unstageable wound has evolved into a healing stage 3 when assessed on 7/5  Present on Admission: No  Consultants:see note  Procedures:  ENDOTRACHEAL INTUBATION (6/8 >> 01/07/2020)  INCISION AND DEBRIDEMENT OF LEFT BUTTOCK AND PERINEUM (12/23/2019)  IRRIGATION AND DEBRIDEMENT OF BUTTOCKS; LAPAROSCOPIC LOOP COLOSTOMY (12/25/2019)  FLEXIBLE SIGMOIDOSCOPY (01/11/2020)   Microbiology:see note  Medications: Scheduled Meds: . ascorbic acid  500 mg Oral BID  . Chlorhexidine Gluconate Cloth  6 each Topical Daily  . collagenase   Topical Daily  . enoxaparin (LOVENOX) injection  40 mg Subcutaneous Q24H  . feeding supplement (KATE FARMS STANDARD 1.4)  325 mL Oral BID BM  . feeding  supplement (PRO-STAT SUGAR FREE 64)  30 mL Oral BID BM  . ferrous sulfate  325 mg Oral BID WC  . folic acid  1 mg Oral Daily  . mouth rinse  15 mL Mouth Rinse BID  . melatonin  3 mg Oral QHS  . metoprolol tartrate  25 mg Oral BID  . mineral oil-hydrophilic petrolatum   Topical TID  . multivitamin with minerals  1 tablet Oral Daily  . nutrition supplement (JUVEN)  1 packet Oral BID BM  . pantoprazole  40 mg Oral Daily  . thiamine  100 mg Oral Daily  . traZODone  100 mg Oral QHS  . Vitamin D (Ergocalciferol)  50,000 Units Oral Q Fri  . zinc sulfate  220 mg Oral Daily   Continuous Infusions: . sodium chloride      Antimicrobials:  Ceftriaxone  Vancomycin  Clindamycin  Linezolid  Flagyl  Finished all antibiotic therapy. Anti-infectives (From admission, onward)   Start     Dose/Rate Route Frequency Ordered Stop   12/31/19 1600  metroNIDAZOLE (FLAGYL) IVPB 500 mg  Status:  Discontinued        500 mg 100 mL/hr over 60 Minutes Intravenous Every 8 hours 12/31/19 1505 01/06/20 0859   12/28/19 1400  vancomycin (VANCOCIN) IVPB 1000 mg/200 mL premix  Status:  Discontinued        1,000 mg 200 mL/hr over 60 Minutes Intravenous Every 24 hours 12/28/19 1005 12/28/19 1343   12/28/19 1400  linezolid (ZYVOX) IVPB 600 mg        600 mg 300 mL/hr over 60 Minutes Intravenous Every 12 hours 12/28/19 1343 01/06/20 1954   12/26/19 1948  vancomycin variable dose per unstable renal function (pharmacist dosing)  Status:  Discontinued         Does not apply See admin instructions 12/26/19 1948 12/28/19 1005   12/23/19 2000  vancomycin (VANCOCIN) IVPB 1000 mg/200 mL premix  Status:  Discontinued        1,000 mg 200 mL/hr over 60 Minutes Intravenous Every 8 hours 12/23/19 1027 12/26/19 1944   12/23/19 1300  clindamycin (CLEOCIN) IVPB 900 mg  Status:  Discontinued        900 mg 100 mL/hr over 30 Minutes Intravenous Every 8 hours 12/23/19 1205 12/31/19 1505   12/23/19 1200  metroNIDAZOLE (FLAGYL)  IVPB 500 mg  Status:  Discontinued        500 mg 100 mL/hr over 60 Minutes Intravenous Every 8 hours 12/23/19 1135 12/23/19 1254   12/23/19 1045  vancomycin (VANCOREADY) IVPB 2000 mg/400 mL        2,000 mg 200 mL/hr over 120 Minutes Intravenous NOW 12/23/19 1018 12/23/19 1441   12/23/19 0300  cefTRIAXone (ROCEPHIN) 2 g in sodium chloride 0.9 % 100 mL IVPB  Status:  Discontinued        2 g 200 mL/hr over 30 Minutes Intravenous Every 24 hours 12/23/19 0236 01/06/20 0859  Objective: Vitals: Today's Vitals   02/01/20 0500 02/01/20 0543 02/01/20 0625 02/01/20 1006  BP:  122/71    Pulse:  95    Resp:  18    Temp:  98.5 F (36.9 C)    TempSrc:  Oral    SpO2:  99%    Weight: 64.5 kg     Height:      PainSc:   9  8     Intake/Output Summary (Last 24 hours) at 02/01/2020 1235 Last data filed at 02/01/2020 0619 Gross per 24 hour  Intake --  Output 800 ml  Net -800 ml   Filed Weights   01/29/20 0600 01/31/20 0628 02/01/20 0500  Weight: 79.2 kg 78.9 kg 64.5 kg   Weight change: -14.4 kg   Intake/Output from previous day: 07/17 0701 - 07/18 0700 In: 240 [P.O.:240] Out: 800 [Stool:800] Intake/Output this shift: No intake/output data recorded.  Examination:  General exam: Alert awake oriented, not in distress, on room air.  HEENT:Oral mucosa moist, Ear/Nose WNL grossly, dentition normal. Respiratory system: Bilaterally clear no wheezing or crackles.. Cardiovascular system: S1 & S2 +, No JVD,. Gastrointestinal system: Abdomen soft, NT,ND, BS+ Nervous System: Alert awake and oriented, nofocal Extremities: Mild nonpitting leg edema much better Skin: No rashes,no icterus.  Wound on her back, dressing did not remove, continue wound care per nursing. MSK: Normal muscle bulk,tone, power Foley catheter +  Data Reviewed: I have personally reviewed following labs and imaging studies CBC: Recent Labs  Lab 01/26/20 0428 01/27/20 0426 01/30/20 0326  WBC 11.6* 10.0 9.7  HGB  7.9* 7.7* 8.2*  HCT 25.9* 24.7* 27.1*  MCV 96.3 95.7 95.1  PLT 429* 455* 096*   Basic Metabolic Panel: Recent Labs  Lab 01/26/20 0428 01/27/20 0426 01/30/20 0326  NA 135 133* 133*  K 3.7 4.4 3.6  CL 96* 95* 97*  CO2 29 28 27   GLUCOSE 128* 126* 97  BUN 15 16 13   CREATININE <0.30* <0.30* <0.30*  CALCIUM 9.1 9.2 9.4   GFR: CrCl cannot be calculated (This lab value cannot be used to calculate CrCl because it is not a number: <0.30). Liver Function Tests: No results for input(s): AST, ALT, ALKPHOS, BILITOT, PROT, ALBUMIN in the last 168 hours. No results for input(s): LIPASE, AMYLASE in the last 168 hours. No results for input(s): AMMONIA in the last 168 hours. Coagulation Profile: No results for input(s): INR, PROTIME in the last 168 hours. Cardiac Enzymes: No results for input(s): CKTOTAL, CKMB, CKMBINDEX, TROPONINI in the last 168 hours. BNP (last 3 results) No results for input(s): PROBNP in the last 8760 hours. HbA1C: No results for input(s): HGBA1C in the last 72 hours. CBG: Recent Labs  Lab 01/28/20 1141 01/28/20 2131 01/29/20 0602 01/29/20 1814 01/30/20 0655  GLUCAP 87 89 100* 127* 93   Lipid Profile: No results for input(s): CHOL, HDL, LDLCALC, TRIG, CHOLHDL, LDLDIRECT in the last 72 hours. Thyroid Function Tests: No results for input(s): TSH, T4TOTAL, FREET4, T3FREE, THYROIDAB in the last 72 hours. Anemia Panel: No results for input(s): VITAMINB12, FOLATE, FERRITIN, TIBC, IRON, RETICCTPCT in the last 72 hours. Sepsis Labs: No results for input(s): PROCALCITON, LATICACIDVEN in the last 168 hours.  No results found for this or any previous visit (from the past 240 hour(s)).    Radiology Studies: No results found.   LOS: 91 days   Antonieta Pert, MD [facility]  02/01/2020, 12:35 PM

## 2020-02-02 MED ORDER — HYDROCODONE-ACETAMINOPHEN 5-325 MG PO TABS
1.0000 | ORAL_TABLET | Freq: Two times a day (BID) | ORAL | Status: DC | PRN
  Administered 2020-02-02 – 2020-02-03 (×2): 1 via ORAL
  Filled 2020-02-02 (×2): qty 1

## 2020-02-02 NOTE — NC FL2 (Signed)
[place] LEVEL OF CARE SCREENING TOOL     IDENTIFICATION  Patient Name: Alyssa Carey Birthdate: 1970/12/19 Sex: female Admission Date (Current Location): 12/21/2019  [facility] and [place] Number:  Engineer, manufacturing systems and Address:  [facility],  [place] [address], [place]      Provider Number: [id number]  Attending Physician Name and Address:  Antonieta Pert, MD  Relative Name and Phone Number:  Antonya Leeder    Current Level of Care: Hospital Recommended Level of Care: [place] Prior Approval Number:    Date Approved/Denied:   PASRR Number: [id number]  Discharge Plan: SNF    Current Diagnoses: Patient Active Problem List   Diagnosis Date Noted  . 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 Aphonia 01/12/2020  . 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 Hypernatremia 01/12/2020  . Anasarca 01/12/2020  . Necrotizing fasciitis of pelvic region and thigh ([place]) 12/28/2019  . 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 Acute respiratory failure with hypoxemia ([place])   . Elevated LFTs   . Pressure injury of skin 12/22/2019  . Fall at home, initial encounter 12/21/2019  . Weakness 12/21/2019  . Alcoholism ([place]) 12/21/2019  . Thrombocytopenia ([place]) 12/21/2019  . ARF (acute renal failure) ([place]) 12/21/2019  . Hepatic encephalopathy ([place]) 12/21/2019  . Alcoholic liver disease ([place]) 12/21/2019    Orientation RESPIRATION BLADDER Height & Weight     Self, Time, Situation  Normal Indwelling catheter Weight: 64.5 kg Height:  5' 4"  (162.6 cm)  BEHAVIORAL SYMPTOMS/MOOD NEUROLOGICAL BOWEL NUTRITION STATUS      Colostomy Diet (Nectar thick)  AMBULATORY STATUS COMMUNICATION OF NEEDS Skin   Limited Assist Verbally PU Stage and Appropriate Care (buttock,pelvic/thigh wounds-w-d dsg bid)     PU Stage 3 Dressing: BID                 Personal Care Assistance Level of Assistance   Bathing, Dressing, Feeding Bathing Assistance: Limited assistance Feeding assistance: Limited assistance Dressing Assistance: Limited assistance     Functional Limitations Info  Sight, Hearing, Speech Sight Info: Impaired (eyeglasses) Hearing Info: Adequate Speech Info: Impaired (Nectar thick.)    SPECIAL CARE FACTORS FREQUENCY  PT (By licensed PT), OT (By licensed OT)     PT Frequency: 5x week OT Frequency: 5x week            Contractures Contractures Info: Not present    Additional Factors Info  Insulin Sliding Scale, Psychotropic, Code Status (Wean off Tube Feeds) Code Status Info: Full code   Psychotropic Info: Trazadone-sleep Insulin Sliding Scale Info: SSI-see MAR       Current Medications (02/02/2020):  This is the current hospital active medication list Current Facility-Administered Medications  Medication Dose Route Frequency Provider Last Rate Last Admin  . 0.9 %  sodium chloride infusion   Intravenous PRN Chesley Mires, MD      . acetaminophen (TYLENOL) 160 MG/5ML solution 650 mg  650 mg Oral Q6H PRN Antonieta Pert, MD   650 mg at 01/30/20 1654  . ascorbic acid (VITAMIN C) tablet 500 mg  500 mg Oral BID Kc, Ramesh, MD   500 mg at 02/02/20 0915  . Chlorhexidine Gluconate Cloth 2 % PADS 6 each  6 each Topical Daily Greer Pickerel, MD   6 each at 02/01/20 1046  . collagenase (SANTYL) ointment   Topical Daily Norm Parcel, [place]   Given at 02/02/20 0920  .  diphenhydrAMINE (BENADRYL) 12.5 MG/5ML elixir 25 mg  25 mg Oral Q6H PRN Antonieta Pert, MD   25 mg at 02/02/20 0028  . enoxaparin (LOVENOX) injection 40 mg  40 mg Subcutaneous Q24H Kc, Ramesh, MD   40 mg at 02/01/20 1644  . feeding supplement (KATE FARMS STANDARD 1.4) liquid 325 mL  325 mL Oral BID BM Kc, Ramesh, MD   325 mL at 01/31/20 1342  . feeding supplement (PRO-STAT SUGAR FREE 64) liquid 30 mL  30 mL Oral BID BM Kc, Ramesh, MD   30 mL at 02/02/20 1227  . ferrous sulfate tablet 325 mg  325 mg Oral BID WC Kc,  Ramesh, MD   325 mg at 02/02/20 0915  . folic acid (FOLVITE) tablet 1 mg  1 mg Oral Daily Kc, Ramesh, MD   1 mg at 02/02/20 0915  . HYDROcodone-acetaminophen (NORCO/VICODIN) 5-325 MG per tablet 1 tablet  1 tablet Oral Q12H PRN Antonieta Pert, MD   1 tablet at 02/02/20 1227  . lidocaine (XYLOCAINE) 2 % jelly 1 application  1 application Topical Once PRN Izora Gala, MD      . lidocaine (XYLOCAINE) 4 % external solution 0-50 mL  0-50 mL Topical Once PRN Izora Gala, MD      . lidocaine-EPINEPHrine (XYLOCAINE-EPINEPHrine) 1 %-1:200000 (PF) injection 0-30 mL  0-30 mL Intradermal Once PRN Izora Gala, MD      . MEDLINE mouth rinse  15 mL Mouth Rinse BID Kc, Ramesh, MD   15 mL at 02/02/20 0921  . melatonin tablet 3 mg  3 mg Oral QHS Kc, Ramesh, MD   3 mg at 02/01/20 2143  . metoprolol tartrate (LOPRESSOR) injection 5 mg  5 mg Intravenous Q6H PRN Barb Merino, MD   5 mg at 01/20/20 0103  . metoprolol tartrate (LOPRESSOR) tablet 25 mg  25 mg Oral BID Antonieta Pert, MD   25 mg at 02/02/20 0915  . mineral oil-hydrophilic petrolatum (AQUAPHOR) ointment   Topical TID Antonieta Pert, MD   1 application at 99/35/70 0921  . multivitamin with minerals tablet 1 tablet  1 tablet Oral Daily Antonieta Pert, MD   1 tablet at 02/02/20 0915  . nutrition supplement (JUVEN) (JUVEN) powder packet 1 packet  1 packet Oral BID BM Antonieta Pert, MD   1 packet at 02/02/20 1347  . ondansetron (ZOFRAN) injection 4 mg  4 mg Intravenous Q6H PRN Greer Pickerel, MD      . oxybutynin [facility]) tablet 2.5 mg  2.5 mg Oral Q8H PRN Opyd, Ilene Qua, MD   2.5 mg at 02/01/20 0522  . oxymetazoline (AFRIN) 0.05 % nasal spray 1 spray  1 spray Each Nare Once PRN Izora Gala, MD      . pantoprazole (PROTONIX) EC tablet 40 mg  40 mg Oral Daily Kc, Ramesh, MD   40 mg at 02/02/20 0915  . phenol (CHLORASEPTIC) mouth spray 1 spray  1 spray Mouth/Throat PRN Lovey Newcomer T, NP   1 spray at 01/24/20 0402  . pneumococcal 23 valent vaccine (PNEUMOVAX-23) injection 0.5  mL  0.5 mL Intramuscular Prior to discharge Juanito Doom, MD      . Resource ThickenUp Clear   Oral PRN Mariel Aloe, MD   Given at 01/09/20 1730  . silver nitrate applicators applicator 1 Stick  1 Stick Topical Once PRN Izora Gala, MD      . sodium chloride (OCEAN) 0.65 % nasal spray 1 spray  1 spray Each Nare PRN Georgette Shell,  MD      . thiamine tablet 100 mg  100 mg Oral Daily Kc, Ramesh, MD   100 mg at 02/02/20 0915  . traMADol (ULTRAM) tablet 50 mg  50 mg Oral Q6H PRN Kc, Ramesh, MD   50 mg at 02/02/20 0930  . traZODone (DESYREL) tablet 100 mg  100 mg Oral QHS Kc, Maren [place], MD   100 mg at 02/01/20 2144  . Triple Antibiotic [phone] OINT 1 application  1 application Apply externally Once PRN Izora Gala, MD      . Vitamin D (Ergocalciferol) (DRISDOL) capsule 50,000 Units  50,000 Units Oral Q Lisabeth Register, MD   50,000 Units at 01/30/20 1239  . zinc sulfate capsule 220 mg  220 mg Oral Daily Kc, Ramesh, MD   220 mg at 02/02/20 0915     Discharge Medications: Please see discharge summary for a list of discharge medications.  Relevant Imaging Results:  Relevant Lab Results:   Additional Information SS#[phone]  Dessa Phi, RN

## 2020-02-02 NOTE — TOC Progression Note (Signed)
Transition of Care [facility]) - Progression Note    Patient Details  Name: Alyssa Carey MRN: [id number] Date of Birth: 01-Jan-1971  [facility] CM/SW Contact  Ross Ludwig, [place] Phone Number: 02/02/2020, 5:57 PM  Clinical Narrative:     CSW spoke to Missy at [facility], she is able to accept patient once she is medically ready for discharge.  CSW contacted attending physician and informed him that the [facility] number has been received.  CSW also informed physician that a new Covid test will have to be ordered.  CSW continuing to follow patient's progress throughout discharge planning.   Expected Discharge Plan: [place] Barriers to Discharge: Continued Medical Work up  Expected Discharge Plan and Services Expected Discharge Plan: [place] In-house Referral: Development worker, community Discharge Planning Services: CM Consult Post Acute Care Choice: [place] Living arrangements for the past 2 months: Single Family Home                                       Social Determinants of Health (SDOH) Interventions    Readmission Risk Interventions No flowsheet data found.

## 2020-02-02 NOTE — Progress Notes (Signed)
  Speech Language Pathology Treatment: Dysphagia  Patient Details Name: Alyssa Carey MRN: [id number] DOB: Sep 11, 1970 Today's Date: 02/02/2020 Time: 7116-5790 SLP Time Calculation (min) (ACUTE ONLY): 22 min  Assessment / Plan / Recommendation Clinical Impression  Alyssa Carey seen at bedside to assess appropriateness to advance diet to regular solids and thin liquids. RN reports Alyssa Carey has demonstrated tolerance for solids, thin liquids, and po medications, and Alyssa Carey has requested advanced textures. SLP observed Alyssa Carey eating a sandwich and drinking liquids. Alyssa Carey was encouraged to limit bolus size and rate. No overt s/s aspiration observed during po trials. Will advance solids to regular textures and continue thin liquids, meds whole with liquid. SLP will continue to follow to assess diet tolerance, and continue education and RMST. Goals updated.    HPI HPI: 49 year old with alcohol abuse, alcohol withdrawal, necrotizing fasciitis of left buttock status post debridement. Acute respiratory failure secondary to septic shock. Intubated from 6/8 to 6/23.   Alyssa Carey underwent MBS on Saturday 01/09/2020 with findings of infrequent and trace, silent aspiration of nectar and thin.  Recommended to start diet of dys1/small, controlled sips of nectar.  CT chest 6/26 showed "Circumferential narrowing of the subglottic airway, which could reflect mucosal edema or stenosis related to previous intubation."  ENT scoped Alyssa Carey 6/28 and noted Alyssa Carey with right vocal fold paralysis and left vocal fold paresis - possibly due to recurrent laryngeal nerve involvement from intubation.  ENT advised Alyssa Carey high aspiration risk.  Follow up indicated.      SLP Plan  Continue with current plan of care       Recommendations  Diet recommendations: Regular;Thin liquid Liquids provided via: Cup;Straw Medication Administration: Whole meds with liquid Supervision: Patient able to self feed;Intermittent supervision to cue for compensatory strategies Compensations:  Slow rate;Small sips/bites Postural Changes and/or Swallow Maneuvers: Seated upright 90 degrees;Upright 30-60 min after meal                Oral Care Recommendations: Oral care QID Follow up Recommendations: None SLP Visit Diagnosis: Dysphagia, oropharyngeal phase (R13.12) Plan: Continue with current plan of care       GO               Earland Reish B. Quentin Ore, [facility], [place] Speech Language Pathologist Office: [phone] Pager: [phone]  Shonna Chock 02/02/2020, 1:57 PM

## 2020-02-02 NOTE — Progress Notes (Signed)
PROGRESS NOTE    Alyssa Carey  OHY:[id number] DOB: [birth year] DOA: 12/21/2019 PCP: Patient, No Pcp Per   Chef Complaints:: Alcohol withdrawal   Brief Narrative:49 year old female who presented secondary to concern for alcohol withdrawal and quickly developed evidence of necrotizing fasciitis, emergently managed in the OR with critical care, general surgery with I&D.  Patient was in ICU needing intubation and vasopressors for septic shock underwent repeat I&D and diverting colostomy.  She has been extubated and off vasopressors. remains in the stepdown unit.  Wound is slowly healing being managed by surgery wound care and had been on hydrotherapy Monday Wednesday Friday.  Followed by speech therapy oral intake inadequate on modified diet and on NG tube feeding. Patient is slowly improving. She had evidence of fluid overload with weight increased up to 260 pound from admission weight of 193-212 lb-placed on IV Lasix with good output and improving weight and switched to oral Lasix, wt down to 220 .   Significant events 6/06 admission 6/08 ICU admission to OR for I&D, back intubated and on pressors, 6/09 started tube feeds. Still pressor dependent,kept on vent w/ anticipated return to OR planned  6/10 Back to the OR for significant debridement and also placement of diverting colostomy 6/11 Sedated, low-dose phenylephrine. CCS felt wound looking good. Sedation changed to precedex / PRN fent  6/12 more awake, on 15/5 pressure support 6/13 family discussion >> DNR, continue medical care 6//15 transfuse 1 unit PRBC; vomiting, tube feeds held 6/16 Vomiting overnight 6/17 Trial PSV 15/5 with variable Vt, remains on vasopressors. Vomiting overnight  6/18 transfuse 1 unit PRBC 6/20 Off pressors, On precedex and weaning on PSV  6/21 Weaning on PSV, planned hydrotherapy 6/23 Extubated 6/24 Transferred to [facility] service. 01/22/20- Tx out of SDU Patient had evidence of fluid overload with weight gain of  2 to 60 pounds from baseline around 200s diarrhea.  IV Lasix with a significantly improved , lasix stopped 7/12. 7/12: Surgery on eval satisfied with her wound healing and will continue twice daily WD dressing and signed off. 7/13-doing well with this.  Starting on dysphagia 3 thin liquid diet and NG tube discontinued 7/16 PICC line LUE removed. 7/18- Off foley  Subjective:  No new complaints. Off Foley catheter yesterday. No chest pain nausea vomiting. On Room air.  Assessment & Plan:  Necrotizing fasciitis of pelvic region and thigh: Patient had emergent operative intervention with incision and debridement, diverting colostomy and had multiple rounds of IV antibiotics (last dose 6/22)-and her wound culture grew E. coli and VRE.  Remains stable wound is healing nicely continue wound care as per surgery with twice daily debility dressing, and follow-up with Dr. Heber [place] as outpatient.  Discussed with Dr. Kieth Brightly and Foley removed.  Ambulate, use the bedside commode/ purewick- surgery team okay with this plan as well.  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  Appreciate speech eval patient is now on dysphagia 3 thin liquid diet, off NG tube.  Patient is wanting to advance diet, will have speech follow-up.   Severe protein calorie malnutrition with low albumin, anasarca with poor nutritional status due to severe illness: Continue to augment nutritional status, vitamin supplementation.   Fluid overload/anasarca: resolved. Diuresed with improvement in her swelling and  wt. Off lasix 7/12. On admission weight was 193 to 213 pounds, went up to 254-260 lb.Wt down to 174 lb-> 142 lb 7/18.  Monitor weight.  Augment  nutritional status.  Pressure injury/DTI of right ear not present on admission.  Continue local supportive care and dressing.  AKI with unknown baseline 1.7 upon admission: Renal failure has resolved.  Hyperglycemia while on tube feeding.  Discontinued her Lantus and CBG Accu-Chek.  Anemia of critical illness/acute blood loss anemia Baseline hemoglobin 12-13 developed anemia in the setting of infection critical illness bleeding.patient was seen by GI for rectal bleeding underwent sigmoidoscopy flexible  Showed-10 cm Stercoral ulcer 6/28 received 3 units PRBC. HB is stable  Recent Labs  Lab 01/27/20 0426 01/30/20 0326  HGB 7.7* 8.2*  HCT 24.7* 27.1*   Morbid obesity with BMI 43.9 at one point suspect from fluids. With diuresis-BMI down to 24.   Sinus tachycardia: HR at times 100-120s and this has been making her MEWS red   She has had extensive evaluation negative for DVT in lower extremities and upper extremities.  She had CT of the chest negative for PE.  TSH is normal.  Continue metoprolol 25 mg twice daily, blood pressure doing well tolerating.  Heart rate is stable.  Erythema generalized from chemical wipe- resolved.patient's family has been educated on not to do anything without asking patient's nurses. Leukocytosis:Leukocytosis resolved.   Hypokalemia/hypomagnesemia: stable, off kcl po Septic shock secondary to #1- resolved Acute hypoxic respiratory failure:resolved   DVT prophylaxis: Code Status: full Family Communication: plan of care discussed with patient and nursing staffs at bedside.  Status is: Inpatient Remains inpatient appropriate because of unsafe disposition.continue on SNF placement. Dispo: The patient is from: Home              Anticipated d/c is to: Skilled nursing facility..              Anticipated d/c date is: Medically stable,awaiting bed              Patient currently is medically stable.at this time she is awaiting for placement.  Patient is in difficult  to place list.  Nutrition: Nutrition and speech are following. Diet Order            DIET DYS 3 Room service appropriate? Yes; Fluid consistency: Thin  Diet effective now                 Nutrition Problem: Increased nutrient needs Etiology: chronic illness, wound healing (alcohol abuse; necrotizing fasciitis) Signs/Symptoms: estimated needs Interventions: Prostat, Juven, MVI, Other (Comment) [employer]) Body mass index is 24.41 kg/m. Pressure Ulcer: Pressure Injury 12/27/19 Ear Right Stage 3 -  Full thickness tissue loss. Subcutaneous fat may be visible but bone, tendon or muscle are NOT exposed. previously noted unstageable wound has evolved into a healing stage 3 when assessed on 7/5 (Active)  12/27/19 1200  Location: Ear  Location Orientation: Right  Staging: Stage 3 -  Full thickness tissue loss. Subcutaneous fat may be visible but bone, tendon or muscle are NOT exposed.  Wound Description (Comments): previously noted unstageable wound has evolved into a healing stage 3 when assessed on 7/5  Present on Admission: No   Consultants:see note  Procedures:  ENDOTRACHEAL INTUBATION (6/8 >> 01/07/2020)  INCISION AND DEBRIDEMENT OF LEFT BUTTOCK AND PERINEUM (12/23/2019)  IRRIGATION AND DEBRIDEMENT OF BUTTOCKS; LAPAROSCOPIC LOOP COLOSTOMY (12/25/2019)  FLEXIBLE SIGMOIDOSCOPY (01/11/2020) Microbiology:see note  Medications: Scheduled Meds: . ascorbic acid  500 mg Oral  BID  . Chlorhexidine Gluconate Cloth  6 each Topical Daily  . collagenase   Topical Daily  . enoxaparin (LOVENOX) injection  40 mg Subcutaneous Q24H  . feeding supplement (KATE FARMS STANDARD 1.4)  325 mL Oral BID BM  . feeding supplement (PRO-STAT SUGAR FREE 64)  30 mL Oral BID BM  . ferrous sulfate  325 mg Oral BID WC  . folic acid  1 mg Oral Daily  . mouth rinse  15 mL Mouth Rinse BID  . melatonin  3 mg Oral QHS  . metoprolol tartrate  25 mg Oral BID  . mineral oil-hydrophilic petrolatum   Topical TID  .  multivitamin with minerals  1 tablet Oral Daily  . nutrition supplement (JUVEN)  1 packet Oral BID BM  . pantoprazole  40 mg Oral Daily  . thiamine  100 mg Oral Daily  . traZODone  100 mg Oral QHS  . Vitamin D (Ergocalciferol)  50,000 Units Oral Q Fri  . zinc sulfate  220 mg Oral Daily   Continuous Infusions: . sodium chloride     Antimicrobials:  Ceftriaxone  Vancomycin  Clindamycin  Linezolid  Flagyl  Finished all antibiotic therapy. Anti-infectives (From admission, onward)   Start     Dose/Rate Route Frequency Ordered Stop   12/31/19 1600  metroNIDAZOLE (FLAGYL) IVPB 500 mg  Status:  Discontinued        500 mg 100 mL/hr over 60 Minutes Intravenous Every 8 hours 12/31/19 1505 01/06/20 0859   12/28/19 1400  vancomycin (VANCOCIN) IVPB 1000 mg/200 mL premix  Status:  Discontinued        1,000 mg 200 mL/hr over 60 Minutes Intravenous Every 24 hours 12/28/19 1005 12/28/19 1343   12/28/19 1400  linezolid (ZYVOX) IVPB 600 mg        600 mg 300 mL/hr over 60 Minutes Intravenous Every 12 hours 12/28/19 1343 01/06/20 1954   12/26/19 1948  vancomycin variable dose per unstable renal function (pharmacist dosing)  Status:  Discontinued         Does not apply See admin instructions 12/26/19 1948 12/28/19 1005   12/23/19 2000  vancomycin (VANCOCIN) IVPB 1000 mg/200 mL premix  Status:  Discontinued        1,000 mg 200 mL/hr over 60 Minutes Intravenous Every 8 hours 12/23/19 1027 12/26/19 1944   12/23/19 1300  clindamycin (CLEOCIN) IVPB 900 mg  Status:  Discontinued        900 mg 100 mL/hr over 30 Minutes Intravenous Every 8 hours 12/23/19 1205 12/31/19 1505   12/23/19 1200  metroNIDAZOLE (FLAGYL) IVPB 500 mg  Status:  Discontinued        500 mg 100 mL/hr over 60 Minutes Intravenous Every 8 hours 12/23/19 1135 12/23/19 1254   12/23/19 1045  vancomycin (VANCOREADY) IVPB 2000 mg/400 mL        2,000 mg 200 mL/hr over 120 Minutes Intravenous NOW 12/23/19 1018 12/23/19 1441   12/23/19  0300  cefTRIAXone (ROCEPHIN) 2 g in sodium chloride 0.9 % 100 mL IVPB  Status:  Discontinued        2 g 200 mL/hr over 30 Minutes Intravenous Every 24 hours 12/23/19 0236 01/06/20 0859       Objective: Vitals: Today's Vitals   02/01/20 2100 02/01/20 2137 02/01/20 2145 02/01/20 2152  BP: 110/70 97/67  105/67  Pulse: (!) 105 (!) 103  (!) 101  Resp: 18 18    Temp: 98.1 F (36.7 C) 98.4 F (36.9 C)  TempSrc: Oral Oral    SpO2: 98% 99%    Weight:      Height:      PainSc:   0-No pain     Intake/Output Summary (Last 24 hours) at 02/02/2020 0828 Last data filed at 02/02/2020 0029 Gross per 24 hour  Intake 600 ml  Output 550 ml  Net 50 ml   Filed Weights   01/29/20 0600 01/31/20 0628 02/01/20 0500  Weight: 79.2 kg 78.9 kg 64.5 kg   Weight change:    Intake/Output from previous day: 07/18 0701 - 07/19 0700 In: 720 [P.O.:720] Out: 550 [Urine:350; Stool:200] Intake/Output this shift: No intake/output data recorded.  Examination:  General exam: AAOX3, NAD, weak appearing. HEENT:Oral mucosa moist, Ear/Nose WNL grossly, dentition normal. Respiratory system: bilaterally clear,no wheezing or crackles,no use of accessory muscle Cardiovascular system: S1 & S2 +, No JVD,. Gastrointestinal system: Abdomen soft,abd obesity+, LLQ colostomy bag with gas, NT,ND, BS+ Nervous System:Alert, awake, moving extremities and grossly nonfocal Extremities: No edema, distal peripheral pulses palpable.  Skin: No rashes,no icterus.BUTTOCK WOUND HEALING WITH GRANULATION TISSUES-PACKING IN PLACE MSK: Normal muscle bulk,tone, power purewick +  Data Reviewed: I have personally reviewed following labs and imaging studies CBC: Recent Labs  Lab 01/27/20 0426 01/30/20 0326  WBC 10.0 9.7  HGB 7.7* 8.2*  HCT 24.7* 27.1*  MCV 95.7 95.1  PLT 455* 834*   Basic Metabolic Panel: Recent Labs  Lab 01/27/20 0426 01/30/20 0326  NA 133* 133*  K 4.4 3.6  CL 95* 97*  CO2 28 27  GLUCOSE 126* 97   BUN 16 13  CREATININE <0.30* <0.30*  CALCIUM 9.2 9.4   GFR: CrCl cannot be calculated (This lab value cannot be used to calculate CrCl because it is not a number: <0.30). Liver Function Tests: No results for input(s): AST, ALT, ALKPHOS, BILITOT, PROT, ALBUMIN in the last 168 hours. No results for input(s): LIPASE, AMYLASE in the last 168 hours. No results for input(s): AMMONIA in the last 168 hours. Coagulation Profile: No results for input(s): INR, PROTIME in the last 168 hours. Cardiac Enzymes: No results for input(s): CKTOTAL, CKMB, CKMBINDEX, TROPONINI in the last 168 hours. BNP (last 3 results) No results for input(s): PROBNP in the last 8760 hours. HbA1C: No results for input(s): HGBA1C in the last 72 hours. CBG: Recent Labs  Lab 01/28/20 1141 01/28/20 2131 01/29/20 0602 01/29/20 1814 01/30/20 0655  GLUCAP 87 89 100* 127* 93   Lipid Profile: No results for input(s): CHOL, HDL, LDLCALC, TRIG, CHOLHDL, LDLDIRECT in the last 72 hours. Thyroid Function Tests: No results for input(s): TSH, T4TOTAL, FREET4, T3FREE, THYROIDAB in the last 72 hours. Anemia Panel: No results for input(s): VITAMINB12, FOLATE, FERRITIN, TIBC, IRON, RETICCTPCT in the last 72 hours. Sepsis Labs: No results for input(s): PROCALCITON, LATICACIDVEN in the last 168 hours.  No results found for this or any previous visit (from the past 240 hour(s)).    Radiology Studies: No results found.   LOS: 60 days   Antonieta Pert, MD [facility]  02/02/2020, 8:28 AM

## 2020-02-02 NOTE — Progress Notes (Signed)
Physical Therapy Treatment Patient Details Name: Alyssa Carey MRN: [id number] DOB: August 08, 1970 Today's Date: 02/02/2020    History of Present Illness 49 y/o female with a heavy alcohol history admitted6/8/21  in the setting of confusion and presumed alcohol withdrawal who later developed sepsis from a gas forming wound, S/P debridement left gluteal wound for necratizing fascitis.VDRF,. Extubated 01/07/20., negative for DVT in bilateral legs and PE. Negative DVT Left and right UE    PT Comments    The patient stood x 3 from bed at RW, tendency to buckle but able to slowly stake a few steps to recliner. Continue to progress PT for ambulation.   Follow Up Recommendations  SNF     Equipment Recommendations  None recommended by PT    Recommendations for Other Services       Precautions / Restrictions Precautions Precautions: Fall Precaution Comments: L gluteal wound, colostomy, Restrictions Weight Bearing Restrictions: No    Mobility  Bed Mobility   Bed Mobility: Supine to Sit Rolling: Min guard Sidelying to sit: Min guard       General bed mobility comments: encouraged patient to perform, moves legs pulling on yellow socks  Transfers Overall transfer level: Needs assistance Equipment used: Rolling walker (2 wheeled) Transfers: Sit to/from Stand Sit to Stand: Mod assist         General transfer comment: at Rw, steady assist to stand x 3 at RW. pivot steps to recliner, right knee blocked for  potential buckling  Ambulation/Gait                 Stairs             Wheelchair Mobility    Modified Rankin (Stroke Patients Only)       Balance Overall balance assessment: Needs assistance Sitting-balance support: No upper extremity supported;Feet supported Sitting balance-Leahy Scale: Fair Sitting balance - Comments: sits with trunk at midline, shifts weight to each side with control, leans forward. sat x 15 "     Standing balance-Leahy Scale:  Poor Standing balance comment: reliant on external support of RW                            Cognition Arousal/Alertness: Awake/alert Behavior During Therapy: WFL for tasks assessed/performed Overall Cognitive Status: Within Functional Limits for tasks assessed                                        Exercises      General Comments        Pertinent Vitals/Pain Faces Pain Scale: Hurts little more Pain Location: buttock Pain Descriptors / Indicators: Discomfort Pain Intervention(s): Repositioned    Home Living                      Prior Function            PT Goals (current goals can now be found in the care plan section) Acute Rehab PT Goals Patient Stated Goal: to get out of hospital PT Goal Formulation: With patient Time For Goal Achievement: 02/16/20 Progress towards PT goals: Progressing toward goals    Frequency    Min 2X/week      PT Plan Current plan remains appropriate    Co-evaluation              AM-PAC PT "6 Clicks"  Mobility   Outcome Measure  Help needed turning from your back to your side while in a flat bed without using bedrails?: A Little Help needed moving from lying on your back to sitting on the side of a flat bed without using bedrails?: A Little Help needed moving to and from a bed to a chair (including a wheelchair)?: A Lot Help needed standing up from a chair using your arms (e.g., wheelchair or bedside chair)?: A Lot Help needed to walk in hospital room?: A Lot Help needed climbing 3-5 steps with a railing? : Total 6 Click Score: 13    End of Session Equipment Utilized During Treatment: Gait belt Activity Tolerance: Patient tolerated treatment well Patient left: in chair;with call bell/phone within reach Nurse Communication: Mobility status;Need for lift equipment PT Visit Diagnosis: Muscle weakness (generalized) (M62.81);Difficulty in walking, not elsewhere classified (R26.2)     Time:  4436-0165 PT Time Calculation (min) (ACUTE ONLY): 35 min  Charges:  $Therapeutic Activity: 23-37 mins                     Alyssa Carey PT Acute Rehabilitation Services Pager [phone] Office [phone]    Claretha Cooper 02/02/2020, 12:27 PM

## 2020-02-03 LAB — COMPREHENSIVE METABOLIC PANEL
ALT: 17 U/L (ref 0–44)
AST: 22 U/L (ref 15–41)
Albumin: 2.4 g/dL — ABNORMAL LOW (ref 3.5–5.0)
Alkaline Phosphatase: 97 U/L (ref 38–126)
Anion gap: 10 (ref 5–15)
BUN: 14 mg/dL (ref 6–20)
CO2: 25 mmol/L (ref 22–32)
Calcium: 9.4 mg/dL (ref 8.9–10.3)
Chloride: 100 mmol/L (ref 98–111)
Creatinine, Ser: 0.31 mg/dL — ABNORMAL LOW (ref 0.44–1.00)
GFR calc Af Amer: >60 mL/min (ref >60)
GFR calc non Af Amer: >60 mL/min (ref >60)
Glucose, Bld: 91 mg/dL (ref 70–99)
Potassium: 3.9 mmol/L (ref 3.5–5.1)
Sodium: 135 mmol/L (ref 135–145)
Total Bilirubin: 0.4 mg/dL (ref 0.3–1.2)
Total Protein: 7 g/dL (ref 6.5–8.1)

## 2020-02-03 LAB — CBC
HCT: 31.2 % — ABNORMAL LOW (ref 36.0–46.0)
Hemoglobin: 9.6 g/dL — ABNORMAL LOW (ref 12.0–15.0)
MCH: 29.3 pg (ref 26.0–34.0)
MCHC: 30.8 g/dL (ref 30.0–36.0)
MCV: 95.1 fL (ref 80.0–100.0)
Platelets: 611 10*3/uL — ABNORMAL HIGH (ref 150–400)
RBC: 3.28 MIL/uL — ABNORMAL LOW (ref 3.87–5.11)
RDW: 17.7 % — ABNORMAL HIGH (ref 11.5–15.5)
WBC: 7.1 10*3/uL (ref 4.0–10.5)
nRBC: 0 % (ref 0.0–0.2)

## 2020-02-03 LAB — SARS CORONAVIRUS 2 BY RT PCR (HOSPITAL ORDER, PERFORMED IN ~~LOC~~ HOSPITAL LAB): SARS Coronavirus 2: NEGATIVE

## 2020-02-03 MED ORDER — FOLIC ACID 1 MG PO TABS: 1.0000 mg | ORAL_TABLET | Freq: Every day | ORAL | Status: DC

## 2020-02-03 MED ORDER — ZINC SULFATE 220 (50 ZN) MG PO CAPS: 220.0000 mg | ORAL_CAPSULE | Freq: Every day | ORAL | 0 refills | Status: AC

## 2020-02-03 MED ORDER — AQUAPHOR EX OINT: TOPICAL_OINTMENT | Freq: Three times a day (TID) | CUTANEOUS | 0 refills | Status: DC

## 2020-02-03 MED ORDER — MELATONIN 3 MG PO TABS: 3.0000 mg | ORAL_TABLET | Freq: Every day | ORAL | 0 refills | Status: DC

## 2020-02-03 MED ORDER — COLLAGENASE 250 UNIT/GM EX OINT: TOPICAL_OINTMENT | Freq: Every day | CUTANEOUS | 0 refills | Status: DC

## 2020-02-03 MED ORDER — PRO-STAT SUGAR FREE PO LIQD: 30.0000 mL | Freq: Two times a day (BID) | ORAL | 0 refills | Status: DC

## 2020-02-03 MED ORDER — ADULT MULTIVITAMIN W/MINERALS CH: 1.0000 | ORAL_TABLET | Freq: Every day | ORAL | Status: DC

## 2020-02-03 MED ORDER — PANTOPRAZOLE SODIUM 40 MG PO TBEC: 40.0000 mg | DELAYED_RELEASE_TABLET | Freq: Every day | ORAL | Status: DC

## 2020-02-03 MED ORDER — ENSURE ENLIVE PO LIQD: 237.0000 mL | Freq: Two times a day (BID) | ORAL | 12 refills | Status: DC

## 2020-02-03 MED ORDER — THIAMINE HCL 100 MG PO TABS: 100.0000 mg | ORAL_TABLET | Freq: Every day | ORAL | Status: DC

## 2020-02-03 MED ORDER — METOPROLOL TARTRATE 25 MG PO TABS: 25.0000 mg | ORAL_TABLET | Freq: Two times a day (BID) | ORAL | Status: DC

## 2020-02-03 MED ORDER — TRAZODONE HCL 100 MG PO TABS: 100.0000 mg | ORAL_TABLET | Freq: Every evening | ORAL | Status: DC | PRN

## 2020-02-03 MED ORDER — JUVEN PO PACK: 1.0000 | PACK | Freq: Two times a day (BID) | ORAL | 0 refills | Status: DC

## 2020-02-03 MED ORDER — ASCORBIC ACID 500 MG PO TABS: 500.0000 mg | ORAL_TABLET | Freq: Two times a day (BID) | ORAL | Status: DC

## 2020-02-03 MED ORDER — FERROUS SULFATE 27 MG PO TABS: 300.0000 mg | ORAL_TABLET | Freq: Two times a day (BID) | ORAL | 3 refills | Status: DC

## 2020-02-03 MED ORDER — VITAMIN D (ERGOCALCIFEROL) 1.25 MG (50000 UNIT) PO CAPS: 50000.0000 [IU] | ORAL_CAPSULE | ORAL | 0 refills | Status: AC

## 2020-02-03 MED ORDER — ENSURE ENLIVE PO LIQD: 237.0000 mL | Freq: Two times a day (BID) | ORAL | Status: DC

## 2020-02-03 NOTE — Discharge Summary (Signed)
Physician Discharge Summary  Alyssa Carey RFF:[id number] DOB: May 07, 1971 DOA: 12/21/2019  PCP: Patient, No Pcp Per  Admit date: 12/21/2019 Discharge date: 02/03/2020  Admitted From: home Disposition: SNF  Recommendations for Outpatient Follow-up:  1. Follow up with PCP in 1-2 weeks 2. Follow-up with Dr. Heber Heflin at wound clinic upon discharge.Please call the office for follow-up. 3. Please obtain BMP/CBC in one week at SNF 4. Follow-up with Dr. Jaynie Crumble general surgery for reversal of colostomy once wound heals  Home Health:No  Equipment/Devices: None  Discharge Condition: Stable Code Status: FULL Diet recommendation:  Diet Order            Diet - low sodium heart healthy           Diet regular Room service appropriate? Yes; Fluid consistency: Thin  Diet effective now                 Brief/Interim Summary: 49 year old female who presented secondary to concern for alcohol withdrawal and quickly developed evidence of necrotizing fasciitis, emergently managed in the OR with critical care, general surgery with I&D.  Patient was in ICU needing intubation and vasopressors for septic shock underwent repeat I&D and diverting colostomy.  She has been extubated and off vasopressors. remains in the stepdown unit.  Wound is slowly healing being managed by surgery wound care and had been on hydrotherapy Monday Wednesday Friday.  Followed by speech therapy oral intake inadequate on modified diet and on NG tube feeding. Patient is slowly improving. She had evidence of fluid overload with weight increased up to 260 pound from admission weight of 193-212 lb-placed on IV Lasix with good output and improving weight and switched to oral Lasix, wt down to 220 .   Significant events 6/06 admission 6/08 ICU admission to OR for I&D, back intubated and on pressors, 6/09 started tube feeds. Still pressor dependent,kept on vent w/ anticipated return to OR planned  6/10 Back to the OR for significant  debridement and also placement of diverting colostomy 6/11 Sedated, low-dose phenylephrine. CCS felt wound looking good. Sedation changed to precedex / PRN fent  6/12 more awake, on 15/5 pressure support 6/13 family discussion >> DNR, continue medical care 6//15 transfuse 1 unit PRBC; vomiting, tube feeds held 6/16 Vomiting overnight 6/17 Trial PSV 15/5 with variable Vt, remains on vasopressors. Vomiting overnight  6/18 transfuse 1 unit PRBC 6/20 Off pressors, On precedex and weaning on PSV  6/21 Weaning on PSV, planned hydrotherapy 6/23 Extubated 6/24 [place] service. 01/22/20- Tx out of SDU Patient had evidence of fluid overload with weight gain of 2 to 60 pounds from baseline around 200s diarrhea.  IV Lasix with a significantly improved , lasix stopped 7/12. 7/12: Surgery on eval satisfied with her wound healing and will continue twice daily WD dressing and signed off. 7/13-doing well with this.  Starting on dysphagia 3 thin liquid diet and NG tube discontinued 7/16 PICC line LUE removed. 7/18- Off foley 7/19: Speech therapy eval completed and advance to regular diet. This time patient is doing well medically stable alert and oriented x3.  Tolerating diet.  Pulmonary in the colostomy.  Ambulatory and strength is improving.  Need to continue with twice daily dressing change keep the area dry-avoid incontinence soaking with urine. She was difficult to place- and was waiting for SNF for several days.Finally social worker was able to arrange a skilled nursing facility and she is being discharged for further rehabilitation therapy and wound care.  Discharge Diagnoses:  Principal Problem:  Necrotizing fasciitis of pelvic region and thigh [facility]) Active Problems:   Fall at home, initial encounter   Weakness   Alcoholism (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 coli and VRE.  Remains stable wound is healing nicely continue wound care as per surgery with twice daily wet  to dry dressing-follow-up with Dr. Heber [place] as outpatient for wound care. Discussed with Dr. Kieth Brightly and Foley removed.  Patient is to follow-up with surgery once wound heals for reversal of colostomy-phone number has been provided.  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  Appreciate speech eval patient is now on dysphagia 3 thin liquid diet, off NG tube.  Speech saw the patient and advanced to regular diet on 7/19.  Tolerating well.  Severe protein calorie malnutrition with low albumin, anasarca with poor nutritional status due to severe illness: Continue nutritional and protein supplement and multivitamins.  Fluid overload/anasarca: resolved. Diuresed with improvement in her swelling and wt. Off lasix 7/12. On admission weight was 193 to 213 pounds, went up to 254-260 lb.Wt down to 174 lb-> 142 lb 7/18.  Monitor weight . Cont nutrition supplement.  Pressure injury/DTI of right ear not present on admission.  Continue local supportive care and dressing.  AKI with unknown baseline 1.7 upon admission: Improved 0.3 on the time of discharge   Recent Labs  Lab 01/30/20 0326 02/03/20 0542  BUN 13 14  CREATININE <0.30* 0.31*    Hyperglycemia while on tube feeding.  Resolved  Anemia of critical illness/acute blood loss anemia Baseline hemoglobin 12-13 developed anemia in the setting of infection critical illness bleeding.patient was seen by GI for rectal bleeding underwent sigmoidoscopy flexible  Showed-10 cm Stercoral ulcer-that was treated.  6/28 received 3 units PRBC. HB is stable at 9.6 gm on day of discharge as below. Recent Labs  Lab 01/30/20 0326 02/03/20 0542  HGB 8.2* 9.6*  HCT 27.1* 31.2*    Morbid obesity with BMI 43.9 at one point suspect from fluids. With diuresis-BMI down to 24.   Sinus tachycardia: Overall is stable.  Had extensive work-up negative for DVT in upper and lower extremities and negative for PE on the CT.  She is on metoprolol continue the same.    Erythema generalized from chemical wipe- resolved.patient's family has been educated on not to do anything without asking patient's nurses. Leukocytosis:Leukocytosis resolved.   Hypokalemia/hypomagnesemia: stable, off kcl po Septic shock secondary to #1- resolved Acute hypoxic respiratory failure:resolved.  Wound care documentation starting  Buttock Wound:Continue WD dressing changes BID for now -ambulatory referral has already been made to Dr. Heber [place] for wound care at discharge  -she will need to  follow up with Dr. Redmond Pulling after her wound heals to discuss colostomy reversal   Reason for Consult: Stage 3 (healing) pressure injury to right ear. Patient complaining that ear "itches" and she is currently rubbing it. Lawyer for lotion. Wound type: Pressure Pressure Injury POA: No Measurement: 0.6c, x 0.3cm x 0.1cm Wound bed: pink, dry Drainage (amount, consistency, odor) none Periwound: peeling Dressing procedure/placement/frequency: I have added Aquaphor ointment three times daily to this area for moisturizing and  reepithelialization.  Stoma type/location: LLQ, end colostomy Stomal assessment/size: 1/2" x 1" oval shaped, flush, in a crease when patient sits up Peristomal assessment: slight denudation noted from 5-8 o'clock  Treatment options for stomal/peristomal skin: 2" skin barrier ring Output liquid green Ostomy pouching: 1pc.flex convex with 2" barrier ring.  Education provided:  Explained role of ostomy nurse and creation of stoma  Explained stoma characteristics (budded, flush, color, texture, care) Demonstrated pouch change (cutting new skin barrier, measuring stoma, cleaning peristomal skin and stoma, use of barrier ring) Education on emptying when 1/3 to 1/2 full and how to empty Discussed use of wick to clean spout  Patient has been in the hospital with stoma since early June, she reports "watching the staff" do pouch changes and empty. She is able to participate today with opening and closing her pouch. She verbalized some of the steps in pouch change.   Stage 3 (healing) pressure injury to right ear. Patient complaining that ear "itches" and she is currently rubbing it. Lawyer for lotion. Wound type: Pressure Pressure Injury POA: No Measurement: 0.6c, x 0.3cm x 0.1cm Wound bed: pink, dry Drainage (amount, consistency, odor) none Periwound: peeling Dressing procedure/placement/frequency: I have added Aquaphor ointment three times daily to this area for moisturizing and reepithelialization.  Pressure Ulcer: Pressure Injury 12/27/19 Ear Right Stage 3 -  Full thickness tissue loss. Subcutaneous fat may be visible but bone, tendon or muscle are NOT exposed. previously noted unstageable wound has evolved into a healing stage 3 when assessed on 7/5 (Active)  12/27/19 1200  Location: Ear  Location Orientation: Right  Staging: Stage 3 -  Full thickness tissue loss. Subcutaneous fat may be visible but bone, tendon or muscle are NOT exposed.  Wound Description (Comments): previously  noted unstageable wound has evolved into a healing stage 3 when assessed on 7/5  Present on Admission: No   Consults:  See note  Subjective:   Discharge Exam: Vitals:   02/03/20 0500 02/03/20 1021  BP: 100/76   Pulse: 100 (!) 111  Resp: 20   Temp: 98 F (36.7 C)   SpO2: 100%    General: Pt is alert, awake, not in acute distress Cardiovascular: RRR, S1/S2 +, no rubs, no gallops Respiratory: CTA bilaterally, no wheezing, no rhonchi Abdominal: Soft, NT, ND, bowel sounds + Extremities: no edema, no cyanosis  Discharge Instructions  Discharge Instructions    AMB referral to wound care center   Complete by: As directed    Large buttock wound   Diet - low sodium heart healthy   Complete by: As directed    Discharge instructions   Complete by: As directed    Please call call MD or return to ER for similar or worsening recurring problem that brought you to hospital or if any fever,nausea/vomiting,abdominal pain, uncontrolled pain, chest pain,  shortness of breath or any other alarming symptoms.   Continue wound care dressing change twice daily wet-to-dry as instructed.  Follow-up with wound clinic with Dr. Heber [place].  Call the  office for follow-up  Follow-up with general surgery once wound heals regarding reversal of colostomy.  Please follow-up your doctor as instructed in a week time and call the office for appointment.  Please avoid alcohol, smoking, or any other illicit substance and maintain healthy habits including taking your regular medications as prescribed.  You were cared for by a hospitalist during your hospital stay. If you have any questions about your discharge medications or the care you received while you were in the hospital after you are discharged, you can call the unit and ask to speak with the hospitalist on call if the hospitalist that took care of you is not available.  Once you are discharged, your primary care physician will handle any further medical  issues. Please note that NO REFILLS for any discharge medications will be authorized once you are discharged, as it is imperative that you return to your primary care physician (or establish a relationship with a primary care physician if you do not have one) for your aftercare needs so that they can reassess your need for medications and monitor your lab values   Discharge wound care:   Complete by: As directed    Buttock Wound:Continue Wet to Dry dressing changes BID . -follow up with Dr. Heber [place] for wound care at discharge. Continue colostomy care and care of the pressure ulcer on the right ear as per Wound Care instruction ordered.   Increase activity slowly   Complete by: As directed      Allergies as of 02/03/2020      Reactions   Penicillins    Did it involve swelling of the face/tongue/throat, SOB, or low BP? N Did it involve sudden or severe rash/hives, skin peeling, or any reaction on the inside of your mouth or nose? Y Did you need to seek medical attention at a hospital or doctor's office? N When did it last happen?Childhood If all above answers are "NO", may proceed with cephalosporin use.      Medication List    TAKE these medications   ascorbic acid 500 MG tablet Commonly known as: VITAMIN C Take 1 tablet (500 mg total) by mouth 2 (two) times daily.   collagenase ointment Commonly known as: SANTYL Apply topically daily. Start taking on: February 04, 2020   diphenhydramine-acetaminophen 25-500 MG Tabs tablet Commonly known as: TYLENOL PM Take 1 tablet by mouth at bedtime as needed (pain).   feeding supplement (PRO-STAT SUGAR FREE 64) Liqd Take 30 mLs by mouth 2 (two) times daily between meals.   Ferrous Sulfate 27 MG Tabs Take 11.1111 tablets (300 mg total) by mouth 2 (two) times daily with a meal.   folic acid 1 MG tablet Commonly known as: FOLVITE Take 1 tablet (1 mg total) by mouth daily. Start taking on: February 04, 2020   ibuprofen 200 MG  tablet Commonly known as: ADVIL Take 200 mg by mouth every 6 (six) hours as needed for moderate pain.   melatonin 3 MG Tabs tablet Take 1 tablet (3 mg total) by mouth at bedtime.   metoprolol tartrate 25 MG tablet Commonly known as: LOPRESSOR Take 1 tablet (25 mg total) by mouth 2 (two) times daily.   mineral oil-hydrophilic petrolatum ointment Apply topically 3 (three) times daily.   multivitamin with minerals Tabs tablet Take 1 tablet by mouth daily. Start taking on: February 04, 2020   nutrition supplement (JUVEN) Pack Take 1 packet by mouth 2 (two) times daily between meals.   pantoprazole 40 MG  tablet Commonly known as: PROTONIX Take 1 tablet (40 mg total) by mouth daily. Start taking on: February 04, 2020   thiamine 100 MG tablet Take 1 tablet (100 mg total) by mouth daily. Start taking on: February 04, 2020   traZODone 100 MG tablet Commonly known as: DESYREL Take 1 tablet (100 mg total) by mouth at bedtime as needed for sleep.   Vitamin D (Ergocalciferol) 1.25 MG (50000 UNIT) Caps capsule Commonly known as: DRISDOL Take 1 capsule (50,000 Units total) by mouth every Friday for 2 doses. Start taking on: February 06, 2020   zinc sulfate 220 (50 Zn) MG capsule Take 1 capsule (220 mg total) by mouth daily. Start taking on: February 04, 2020            Discharge Care Instructions  (From admission, onward)         Start     Ordered   02/03/20 0000  Discharge wound care:       Comments: Buttock Wound:Continue Wet to Dry dressing changes BID . -follow up with Dr. Heber [place] for wound care at discharge. Continue colostomy care and care of the pressure ulcer on the right ear as per Wound Care instruction ordered.   02/03/20 1112          Follow-up Information    [place] Shan Ratliff, DO Follow up in 2 week(s).   Specialty: Internal Medicine Why: follow up for wound care Contact information: [address] [place] [address] [phone]         Surgery, Page Follow up.   Specialty: General Surgery Why: follow up after wound healed to discuss colostomy reversal Contact information: [place] [address] [phone]        PCP at SNF Follow up in 2 day(s).              Allergies  Allergen Reactions  . Penicillins     Did it involve swelling of the face/tongue/throat, SOB, or low BP? N Did it involve sudden or severe rash/hives, skin peeling, or any reaction on the inside of your mouth or nose? Y Did you need to seek medical attention at a hospital or doctor's office? N When did it last happen?Childhood If all above answers are "NO", may proceed with cephalosporin use.     The results of significant diagnostics from this hospitalization (including imaging, microbiology, ancillary and laboratory) are listed below for reference.    Microbiology: Recent Results (from the past 240 hour(s))  SARS Coronavirus 2 by RT PCR (hospital order, performed in [facility] hospital lab) Nasopharyngeal Nasopharyngeal Swab     Status: None   Collection Time: 02/03/20  9:25 AM   Specimen: Nasopharyngeal Swab  Result Value Ref Range Status   SARS Coronavirus 2 NEGATIVE NEGATIVE Final    Comment: (NOTE) SARS-CoV-2 target nucleic acids are NOT DETECTED.  The SARS-CoV-2 RNA is generally detectable in upper and lower respiratory specimens during the acute phase of infection. The lowest concentration of SARS-CoV-2 viral copies this assay can detect is 250 copies / mL. A negative result does not preclude SARS-CoV-2 infection and should not be used as the sole basis for treatment or other patient management decisions.  A negative result may occur with improper specimen collection / handling, submission of specimen other than nasopharyngeal swab, presence of viral mutation(s) within the areas targeted by this assay, and inadequate number of viral copies (<250 copies / mL). A negative result must  be combined with clinical observations, patient history, and epidemiological information.  Fact Sheet for Patients:   StrictlyIdeas.no  Fact Sheet for Healthcare Providers: BankingDealers.co.za  This test is not yet approved or  cleared by the [place] FDA and has been authorized for detection and/or diagnosis of SARS-CoV-2 by FDA under an Emergency Use Authorization (EUA).  This EUA will remain in effect (meaning this test can be used) for the duration of the COVID-19 declaration under Section 564(b)(1) of the Act, 21 U.S.C. section 360bbb-3(b)(1), unless the authorization is terminated or revoked sooner.  Performed at [facility], [place] [address] [place], [place] [address]     Procedures/Studies: DG Chest 1 View  Result Date: 01/04/2020 CLINICAL DATA:  Repositioning of ET tube EXAM: CHEST  1 VIEW COMPARISON:  January 02, 2020 FINDINGS: The ETT is in good position. A new left PICC line terminates in the region of the caval atrial junction. The feeding tube terminates below today's film. Low lung volumes limit evaluation. Bilateral pulmonary infiltrates have improved in the interval. No change in the cardiomediastinal silhouette. The right hemidiaphragm remains elevated. No pneumothorax. IMPRESSION: 1. Support apparatus as above. 2. Decreasing bilateral pulmonary infiltrates. Recommend follow-up to complete resolution. Electronically Signed   By: Dorise Bullion III M.D   On: 01/04/2020 13:23   DG Abd 1 View  Result Date: 01/17/2020 CLINICAL DATA:  New feeding tube placement. EXAM: ABDOMEN - 1 VIEW COMPARISON:  01/16/2020 FINDINGS: Feeding tube is in place, tip within the stomach, redirected upon itself in the region of the fundus. Bowel gas pattern is nonobstructive. IMPRESSION: Feeding tube tip in the stomach. Electronically Signed   By: Nolon Nations M.D.   On: 01/17/2020 11:21   DG Abd 1 View  Result Date:  01/16/2020 CLINICAL DATA:  Feeding tube replacement. EXAM: ABDOMEN - 1 VIEW COMPARISON:  Same day. FINDINGS: The bowel gas pattern is normal. Feeding tube is noted which is looped within the proximal stomach, with the distal tip in the most proximal stomach. No radio-opaque calculi or other significant radiographic abnormality are seen. IMPRESSION: Feeding tube is looped within proximal stomach, with distal tip in the most proximal stomach. Electronically Signed   By: Marijo Conception M.D.   On: 01/16/2020 10:48   DG Abd 1 View  Result Date: 01/16/2020 CLINICAL DATA:  Nasogastric tube placement. EXAM: ABDOMEN - 1 VIEW COMPARISON:  January 16, 2020. FINDINGS: The bowel gas pattern is normal. Distal tip of feeding tube is seen in distal stomach which is unchanged compared to prior exam. No radio-opaque calculi or other significant radiographic abnormality are seen. IMPRESSION: Distal tip of feeding tube seen in distal stomach. Electronically Signed   By: Marijo Conception M.D.   On: 01/16/2020 09:09   DG Abd 1 View  Result Date: 01/16/2020 CLINICAL DATA:  Check gastric catheter placement EXAM: ABDOMEN - 1 VIEW COMPARISON:  Film from earlier in the same day. FINDINGS: Weighted feeding catheter is noted extending into at least the distal stomach and possibly into the proximal duodenum. It is uncertain whether the catheter is kinked upon itself within the distal stomach or proceeds into the proximal duodenum. IMPRESSION: Weighted feeding catheter as described. There is kinking of the catheter distally which may lie within the distal stomach or proximal duodenum. Contrast injection may be helpful for further evaluation. Electronically Signed   By: Inez Catalina M.D.   On: 01/16/2020 08:57   DG Abd 1 View  Result Date: 01/16/2020 CLINICAL  DATA:  Nasogastric tube placement EXAM: ABDOMEN - 1 VIEW COMPARISON:  01/12/2020 FINDINGS: Weighted feeding tube with tip at the proximal duodenum. Distally, the tube is kinked in the  setting of indwelling wire. The visualized bowel gas pattern is nonobstructive IMPRESSION: Feeding tube with tip at the proximal duodenum. Electronically Signed   By: Monte Fantasia M.D.   On: 01/16/2020 05:17   DG Abd 1 View  Result Date: 01/12/2020 CLINICAL DATA:  Feeding tube placement. EXAM: ABDOMEN - 1 VIEW COMPARISON:  January 05, 2020. FINDINGS: The bowel gas pattern is normal. Distal tip of feeding tube is seen in expected position of distal stomach or proximal duodenum. No radio-opaque calculi or other significant radiographic abnormality are seen. IMPRESSION: Distal tip of feeding tube seen in expected position of distal stomach or proximal duodenum. Electronically Signed   By: Marijo Conception M.D.   On: 01/12/2020 12:38   DG Abd 1 View  Result Date: 01/05/2020 CLINICAL DATA:  S/p OG tube placement EXAM: ABDOMEN - 1 VIEW COMPARISON:  None. FINDINGS: OG tube extends the stomach.  Side port below the GE junction. IMPRESSION: OG tube in stomach. Electronically Signed   By: Suzy Bouchard M.D.   On: 01/05/2020 18:19   CT CHEST WO CONTRAST  Result Date: 01/10/2020 CLINICAL DATA:  Shortness of breath, pleural effusion, abnormal chest x-ray EXAM: CT CHEST WITHOUT CONTRAST TECHNIQUE: Multidetector CT imaging of the chest was performed following the standard protocol without IV contrast. COMPARISON:  01/10/2020 FINDINGS: Cardiovascular: Unenhanced imaging of the heart demonstrates no pericardial effusion. Normal caliber of the thoracic aorta. No significant atherosclerosis. Left-sided PICC extends to the atrial caval junction. Mediastinum/Nodes: No enlarged mediastinal or axillary lymph nodes. Thyroid gland and esophagus are grossly unremarkable. There is circumferential narrowing of the subglottic airway, which could reflect mucosal edema or stenosis related previous intubation. Lungs/Pleura: Dependent atelectasis is identified within the lower lobes, left greater than right. There are trace bilateral  pleural effusions, left greater than right. No acute airspace disease or pneumothorax. The described abnormality at the right hilum on prior chest x-ray likely reflects confluence of atelectasis and vascular shadows. Upper Abdomen: There is extensive body wall edema within the bilateral flanks. No acute intra-abdominal process. Musculoskeletal: No acute or destructive bony lesions. Reconstructed images demonstrate no additional findings. IMPRESSION: 1. Trace bilateral pleural effusions, left greater than right, with bilateral dependent lower lobe atelectasis. 2. Circumferential narrowing of the subglottic airway, which could reflect mucosal edema or stenosis related to previous intubation. Electronically Signed   By: Randa Ngo M.D.   On: 01/10/2020 19:25   CT ANGIO CHEST PE W OR WO CONTRAST  Result Date: 01/13/2020 CLINICAL DATA:  Shortness of breath with elevated D-dimer. Concern for acute pulmonary embolism. EXAM: CT ANGIOGRAPHY CHEST WITH CONTRAST TECHNIQUE: Multidetector CT imaging of the chest was performed using the standard protocol during bolus administration of intravenous contrast. Multiplanar CT image reconstructions and MIPs were obtained to evaluate the vascular anatomy. CONTRAST:  42m OMNIPAQUE IOHEXOL 350 MG/ML SOLN COMPARISON:  CT chest dated June 26 21 FINDINGS: Cardiovascular: Evaluation for acute pulmonary emboli is significantly limited by respiratory motion artifact and the patient's body habitus. Given these limitations, no large centrally located pulmonary embolism was detected. Detection of smaller pulmonary emboli is severely limited on this study. The heart size is enlarged. There is no significant pericardial effusion. There is no evidence for thoracic aortic dissection or aneurysm. The arch vessels are patent. There is a well-positioned left-sided PICC line.  Mediastinum/Nodes: -- No mediastinal lymphadenopathy. -- No hilar lymphadenopathy. -- No axillary lymphadenopathy. -- No  supraclavicular lymphadenopathy. -- Normal thyroid gland where visualized. -the enteric tube extends through the esophagus. Lungs/Pleura: There are small moderate-sized bilateral pleural effusions, slightly increased in size since prior study. There is atelectasis the lung bases. There is no pneumothorax. Atelectasis is noted in the right middle lobe. The trachea is unremarkable. Upper Abdomen: Contrast bolus timing is not optimized for evaluation of the abdominal organs. Body wall edema is again noted. There is probable underlying hepatic steatosis. There is a trace amount of free fluid in the upper abdomen about the patient's liver. Musculoskeletal: No chest wall abnormality. No bony spinal canal stenosis. Review of the MIP images confirms the above findings. IMPRESSION: 1. Evaluation for acute pulmonary emboli is significantly limited by respiratory motion artifact and the patient's body habitus. Given these limitations, no large centrally located pulmonary embolism was detected. Detection of smaller pulmonary emboli is severely limited on this study. 2. Small to moderate-sized bilateral pleural effusions, slightly increased in size since prior study. 3. Cardiomegaly. 4. Trace ascites in the upper abdomen. Electronically Signed   By: Constance Holster M.D.   On: 01/13/2020 22:39   DG CHEST PORT 1 VIEW  Result Date: 01/12/2020 CLINICAL DATA:  Acute respiratory failure with hypoxia. EXAM: PORTABLE CHEST 1 VIEW COMPARISON:  01/10/2020 FINDINGS: The left PICC line is in good position, unchanged. Stable cardiac enlargement and prominent mediastinal contours. Central vascular congestion and possible perihilar pulmonary edema. No large pleural effusions. Bibasilar atelectasis is noted. IMPRESSION: 1. Stable cardiac enlargement, vascular congestion and possible perihilar pulmonary edema. 2. Bibasilar atelectasis. Electronically Signed   By: Marijo Sanes M.D.   On: 01/12/2020 11:40   DG CHEST PORT 1 VIEW  Result  Date: 01/10/2020 CLINICAL DATA:  Tachypnea. EXAM: PORTABLE CHEST 1 VIEW COMPARISON:  01/07/2020 FINDINGS: Left-sided PICC line has tip at the cavoatrial junction. Lungs are hypoinflated with mild hazy opacification over the left base/retrocardiac region which may be slightly worse and due to small layering effusions/atelectasis. Right perihilar opacification with possible cavitation unchanged. Cardiomediastinal silhouette and remainder of the exam is unchanged. IMPRESSION: 1. Mild hazy opacification over the left base/retrocardiac region which may represent layering small effusion/atelectasis. Stable opacification of the right hilar region with possible cavitation. Chest CT would be helpful for better evaluation. 2.  Left-sided PICC line with tip at the cavoatrial junction. Electronically Signed   By: Marin Olp M.D.   On: 01/10/2020 10:36   DG CHEST PORT 1 VIEW  Result Date: 01/07/2020 CLINICAL DATA:  Respiratory failure. EXAM: PORTABLE CHEST 1 VIEW COMPARISON:  01/05/2020 FINDINGS: Endotracheal tube is 21 mm above the carina. Feeding tube has been removed and NG tube has been inserted. The tip is below the diaphragm. PICC tip overlies the right atrium and could be retracted slightly. Persistent bibasilar atelectasis. Increased slight haziness over the right hemithorax could represent an effusion. No bone abnormality. IMPRESSION: 1. Persistent bibasilar atelectasis. 2. Increased haziness over the right hemithorax could represent an effusion. 3. PICC tip overlies the right atrium and could be retracted slightly. Electronically Signed   By: Lorriane Shire M.D.   On: 01/07/2020 08:54   DG Chest Port 1 View  Result Date: 01/05/2020 CLINICAL DATA:  Acute respiratory failure EXAM: PORTABLE CHEST 1 VIEW COMPARISON:  Portable exam 0513 hours compared to 01/04/2020 FINDINGS: Tip of endotracheal tube projects 2.8 cm above carina. Feeding tube extends into stomach. LEFT arm PICC line tip projecting  over RIGHT  atrium, accentuated by hypoinflation. Normal heart size and mediastinal contours. Low lung volumes with bibasilar atelectasis. No pleural effusion or pneumothorax. IMPRESSION: Decreased lung volumes with bibasilar atelectasis. Electronically Signed   By: Lavonia Dana M.D.   On: 01/05/2020 08:20   DG Swallowing Func-Speech Pathology  Result Date: 01/09/2020 Objective Swallowing Evaluation: Type of Study: MBS-Modified Barium Swallow Study  Patient Details Name: TATISHA CERINO MRN: [id number] Date of Birth: 1970-09-24 Today's Date: 01/09/2020 Time: SLP Start Time (ACUTE ONLY): 1230 -SLP Stop Time (ACUTE ONLY): 1246 SLP Time Calculation (min) (ACUTE ONLY): 16 min Past Medical History: No past medical history on file. Past Surgical History: Past Surgical History: Procedure Laterality Date . INCISION AND DRAINAGE PERIRECTAL ABSCESS N/A 12/25/2019  Procedure: IRRIGATION AND DEBRIDEMENT BUTTOCKS, LAP LOOP COLOSTOMY;  Surgeon: Greer Pickerel, MD;  Location: [facility];  Service: General;  Laterality: N/A; . IRRIGATION AND DEBRIDEMENT ABSCESS N/A 12/23/2019  Procedure: EXCISION AND DEBRIDEMENT LEFT BUTTOCK AND PERINEUM;  Surgeon: Clovis Riley, MD;  Location: [facility];  Service: General;  Laterality: N/A; . LAPAROSCOPIC LOOP COLOSTOMY N/A 12/25/2019  Procedure: LAPAROSCOPIC LOOP COLOSTOMY;  Surgeon: Greer Pickerel, MD;  Location: [facility];  Service: General;  Laterality: N/A; HPI: 49 year old with alcohol abuse, alcohol withdrawal, necrotizing fasciitis of left buttock status post debridement. Acute respiratory failure secondary to septic shock. Intubated from 6/8 to 6/23.  No data recorded Assessment / Plan / Recommendation CHL IP CLINICAL IMPRESSIONS 01/09/2020 Clinical Impression Pt demonstrates moderate dysphagia with decreased airway protectiona nd decreased sensation. Pt has mild oral deficits including lingual pumping of pureed solids and struggle to initaite transit of all boluses (pt refused graham). Pt fearfully releases the  head of the boluses which spills to pyriforms to trigger more reflexive timely swallow response with piecemealed bolus. Pt had 100% tolerance of honey thick liquids and puree, but there were also episodes of mild backflow from the cervical/upper throacic esophagus to the pyriforms with thicker texture. There was one instance of silent trace aspiration of a large nectar bolus and one instance of silent aspiration of initial volitional spillage of thin liquids. Pt senses slight oral residual and clears with a final swallow. Pt is recommend to initiate a puree diet with nectar thick liquids if they can be given with small single sips (via spoon cup or straw). If any concern arises, downgrade to thicker honey texture. SLP Visit Diagnosis Dysphagia, oropharyngeal phase (R13.12) Attention and concentration deficit following -- Frontal lobe and executive function deficit following -- Impact on safety and function Moderate aspiration risk   CHL IP TREATMENT RECOMMENDATION 01/09/2020 Treatment Recommendations Therapy as outlined in treatment plan below   Prognosis 01/09/2020 Prognosis for Safe Diet Advancement Fair Barriers to Reach Goals Severity of deficits Barriers/Prognosis Comment -- CHL IP DIET RECOMMENDATION 01/09/2020 SLP Diet Recommendations Dysphagia 1 (Puree) solids;Nectar thick liquid Liquid Administration via Cup;Straw Medication Administration Crushed with puree Compensations Slow rate;Small sips/bites;Minimize environmental distractions Postural Changes Seated upright at 90 degrees   CHL IP OTHER RECOMMENDATIONS 01/09/2020 Recommended Consults -- Oral Care Recommendations Oral care BID Other Recommendations Order thickener from pharmacy   CHL IP FOLLOW UP RECOMMENDATIONS 01/09/2020 Follow up Recommendations Skilled Nursing facility   [facility] IP FREQUENCY AND DURATION 01/09/2020 Speech Therapy Frequency (ACUTE ONLY) min 2x/week Treatment Duration 2 weeks      CHL IP ORAL PHASE 01/09/2020 Oral Phase Impaired Oral - Pudding  Teaspoon -- Oral - Pudding Cup -- Oral - Honey Teaspoon -- Oral - Honey Cup Lingual  pumping;Holding of bolus;Reduced posterior propulsion;Piecemeal swallowing;Lingual/palatal residue;Delayed oral transit;Decreased bolus cohesion Oral - Nectar Teaspoon Lingual pumping;Holding of bolus;Reduced posterior propulsion;Piecemeal swallowing;Lingual/palatal residue;Delayed oral transit;Decreased bolus cohesion Oral - Nectar Cup Lingual pumping;Holding of bolus;Reduced posterior propulsion;Piecemeal swallowing;Lingual/palatal residue;Delayed oral transit;Decreased bolus cohesion Oral - Nectar Straw Lingual pumping;Holding of bolus;Reduced posterior propulsion;Piecemeal swallowing;Lingual/palatal residue;Delayed oral transit;Decreased bolus cohesion Oral - Thin Teaspoon -- Oral - Thin Cup Lingual pumping;Holding of bolus;Reduced posterior propulsion;Piecemeal swallowing;Lingual/palatal residue;Delayed oral transit;Decreased bolus cohesion Oral - Thin Straw Lingual pumping;Holding of bolus;Reduced posterior propulsion;Piecemeal swallowing;Lingual/palatal residue;Delayed oral transit;Decreased bolus cohesion Oral - Puree Lingual pumping;Holding of bolus;Reduced posterior propulsion;Piecemeal swallowing;Lingual/palatal residue;Delayed oral transit;Decreased bolus cohesion Oral - Mech Soft -- Oral - Regular NT Oral - Multi-Consistency -- Oral - Pill -- Oral Phase - Comment --  CHL IP PHARYNGEAL PHASE 01/09/2020 Pharyngeal Phase Impaired Pharyngeal- Pudding Teaspoon -- Pharyngeal -- Pharyngeal- Pudding Cup -- Pharyngeal -- Pharyngeal- Honey Teaspoon -- Pharyngeal -- Pharyngeal- Honey Cup Delayed swallow initiation-vallecula Pharyngeal -- Pharyngeal- Nectar Teaspoon Delayed swallow initiation-vallecula;Delayed swallow initiation-pyriform sinuses Pharyngeal -- Pharyngeal- Nectar Cup Delayed swallow initiation-pyriform sinuses;Delayed swallow initiation-vallecula Pharyngeal -- Pharyngeal- Nectar Straw Delayed swallow  initiation-pyriform sinuses;Penetration/Aspiration during swallow Pharyngeal Material enters airway, passes BELOW cords without attempt by patient to eject out (silent aspiration) Pharyngeal- Thin Teaspoon -- Pharyngeal -- Pharyngeal- Thin Cup Delayed swallow initiation-pyriform sinuses Pharyngeal -- Pharyngeal- Thin Straw Delayed swallow initiation-pyriform sinuses;Penetration/Aspiration before swallow;Trace aspiration Pharyngeal Material enters airway, passes BELOW cords without attempt by patient to eject out (silent aspiration) Pharyngeal- Puree -- Pharyngeal -- Pharyngeal- Mechanical Soft -- Pharyngeal -- Pharyngeal- Regular -- Pharyngeal -- Pharyngeal- Multi-consistency -- Pharyngeal -- Pharyngeal- Pill -- Pharyngeal -- Pharyngeal Comment --  No flowsheet data found. Herbie Baltimore, MA CCC-SLP Acute Rehabilitation Services Pager [phone] Office [phone] Lynann Beaver 01/09/2020, 1:47 PM              VAS [place] LOWER EXTREMITY VENOUS (DVT)  Result Date: 01/14/2020  Lower Venous DVTStudy Indications: Swelling.  Risk Factors: None identified. Limitations: Body habitus, poor ultrasound/tissue interface and patient positioning, patient immobility. Comparison Study: No prior studies. Performing Technologist: Oliver Hum RVT  Examination Guidelines: A complete evaluation includes B-mode imaging, spectral Doppler, color Doppler, and power Doppler as needed of all accessible portions of each vessel. Bilateral testing is considered an integral part of a complete examination. Limited examinations for reoccurring indications may be performed as noted. The reflux portion of the exam is performed with the patient in reverse Trendelenburg.  +---------+---------------+---------+-----------+----------+--------------+ RIGHT    CompressibilityPhasicitySpontaneityPropertiesThrombus Aging +---------+---------------+---------+-----------+----------+--------------+ CFV      Full           Yes       Yes                                 +---------+---------------+---------+-----------+----------+--------------+ SFJ      Full                                                        +---------+---------------+---------+-----------+----------+--------------+ FV Prox  Full                                                        +---------+---------------+---------+-----------+----------+--------------+  FV Mid                  Yes      Yes                                 +---------+---------------+---------+-----------+----------+--------------+ FV Distal               Yes      Yes                                 +---------+---------------+---------+-----------+----------+--------------+ PFV      Full                                                        +---------+---------------+---------+-----------+----------+--------------+ POP      Full           Yes      Yes                                 +---------+---------------+---------+-----------+----------+--------------+ PTV      Full                                                        +---------+---------------+---------+-----------+----------+--------------+ PERO     Full                                                        +---------+---------------+---------+-----------+----------+--------------+   +---------+---------------+---------+-----------+----------+--------------+ LEFT     CompressibilityPhasicitySpontaneityPropertiesThrombus Aging +---------+---------------+---------+-----------+----------+--------------+ CFV      Full           Yes      Yes                                 +---------+---------------+---------+-----------+----------+--------------+ SFJ      Full                                                        +---------+---------------+---------+-----------+----------+--------------+ FV Prox  Full                                                         +---------+---------------+---------+-----------+----------+--------------+ FV Mid                  Yes      Yes                                 +---------+---------------+---------+-----------+----------+--------------+  FV Distal               Yes      Yes                                 +---------+---------------+---------+-----------+----------+--------------+ PFV      Full                                                        +---------+---------------+---------+-----------+----------+--------------+ POP      Full           Yes      Yes                                 +---------+---------------+---------+-----------+----------+--------------+ PTV                                                   Not visualized +---------+---------------+---------+-----------+----------+--------------+ PERO                                                  Not visualized +---------+---------------+---------+-----------+----------+--------------+     Summary: RIGHT: - There is no evidence of deep vein thrombosis in the lower extremity. However, portions of this examination were limited- see technologist comments above.  - No cystic structure found in the popliteal fossa.  LEFT: - There is no evidence of deep vein thrombosis in the lower extremity. However, portions of this examination were limited- see technologist comments above.  - No cystic structure found in the popliteal fossa.  *See table(s) above for measurements and observations. Electronically signed by Harold Barban MD on 01/14/2020 at 8:50:45 PM.    Final    VAS [place] UPPER EXTREMITY VENOUS DUPLEX  Result Date: 01/16/2020 UPPER VENOUS STUDY  Indications: Swelling Risk Factors: None identified. Limitations: Body habitus, poor ultrasound/tissue interface, bandages and line. Comparison Study: No prior studies. Performing Technologist: Oliver Hum RVT  Examination Guidelines: A complete evaluation includes B-mode imaging,  spectral Doppler, color Doppler, and power Doppler as needed of all accessible portions of each vessel. Bilateral testing is considered an integral part of a complete examination. Limited examinations for reoccurring indications may be performed as noted.  Right Findings: +----------+------------+---------+-----------+----------+-------+ RIGHT     CompressiblePhasicitySpontaneousPropertiesSummary +----------+------------+---------+-----------+----------+-------+ Subclavian    Full       Yes       Yes                      +----------+------------+---------+-----------+----------+-------+  Left Findings: +----------+------------+---------+-----------+----------+-------+ LEFT      CompressiblePhasicitySpontaneousPropertiesSummary +----------+------------+---------+-----------+----------+-------+ IJV           Full       Yes       Yes                      +----------+------------+---------+-----------+----------+-------+ Subclavian    Full       Yes  Yes                      +----------+------------+---------+-----------+----------+-------+ Axillary      Full       Yes       Yes                      +----------+------------+---------+-----------+----------+-------+ Brachial      Full       Yes       Yes                      +----------+------------+---------+-----------+----------+-------+ Radial        Full                                          +----------+------------+---------+-----------+----------+-------+ Ulnar         Full                                          +----------+------------+---------+-----------+----------+-------+ Cephalic      Full                                          +----------+------------+---------+-----------+----------+-------+ Basilic       Full                                          +----------+------------+---------+-----------+----------+-------+  Summary:  Right: No evidence of thrombosis in the  subclavian.  Left: No evidence of deep vein thrombosis in the upper extremity. No evidence of superficial vein thrombosis in the upper extremity.  *See table(s) above for measurements and observations.  Diagnosing physician: Aveyah Martinez MD Electronically signed by Tommie Martinez MD on 01/16/2020 at 6:33:16 PM.    Final     Labs: BNP (last 3 results) No results for input(s): BNP in the last 8760 hours. Basic Metabolic Panel: Recent Labs  Lab 01/30/20 0326 02/03/20 0542  NA 133* 135  K 3.6 3.9  CL 97* 100  CO2 27 25  GLUCOSE 97 91  BUN 13 14  CREATININE <0.30* 0.31*  CALCIUM 9.4 9.4   Liver Function Tests: Recent Labs  Lab 02/03/20 0542  AST 22  ALT 17  ALKPHOS 97  BILITOT 0.4  PROT 7.0  ALBUMIN 2.4*   No results for input(s): LIPASE, AMYLASE in the last 168 hours. No results for input(s): AMMONIA in the last 168 hours. CBC: Recent Labs  Lab 01/30/20 0326 02/03/20 0542  WBC 9.7 7.1  HGB 8.2* 9.6*  HCT 27.1* 31.2*  MCV 95.1 95.1  PLT 557* 611*   Cardiac Enzymes: No results for input(s): CKTOTAL, CKMB, CKMBINDEX, TROPONINI in the last 168 hours. BNP: Invalid input(s): POCBNP CBG: Recent Labs  Lab 01/28/20 1141 01/28/20 2131 01/29/20 0602 01/29/20 1814 01/30/20 0655  GLUCAP 87 89 100* 127* 93   D-Dimer No results for input(s): DDIMER in the last 72 hours. Hgb A1c No results for input(s): HGBA1C in the last 72 hours. Lipid Profile No results for input(s): CHOL, HDL, LDLCALC, TRIG, CHOLHDL, LDLDIRECT in the last  72 hours. Thyroid function studies No results for input(s): TSH, T4TOTAL, T3FREE, THYROIDAB in the last 72 hours.  Invalid input(s): FREET3 Anemia work up No results for input(s): VITAMINB12, FOLATE, FERRITIN, TIBC, IRON, RETICCTPCT in the last 72 hours. Urinalysis    Component Value Date/Time   COLORURINE AMBER (A) 01/11/2020 0901   APPEARANCEUR HAZY (A) 01/11/2020 0901   LABSPEC 1.023 01/11/2020 0901   PHURINE 5.0 01/11/2020 0901    GLUCOSEU NEGATIVE 01/11/2020 0901   HGBUR NEGATIVE 01/11/2020 0901   BILIRUBINUR NEGATIVE 01/11/2020 0901   KETONESUR NEGATIVE 01/11/2020 0901   PROTEINUR 30 (A) 01/11/2020 0901   NITRITE NEGATIVE 01/11/2020 0901   LEUKOCYTESUR SMALL (A) 01/11/2020 0901   Sepsis Labs Invalid input(s): PROCALCITONIN,  WBC,  LACTICIDVEN Microbiology Recent Results (from the past 240 hour(s))  SARS Coronavirus 2 by RT PCR (hospital order, performed in [place] hospital lab) Nasopharyngeal Nasopharyngeal Swab     Status: None   Collection Time: 02/03/20  9:25 AM   Specimen: Nasopharyngeal Swab  Result Value Ref Range Status   SARS Coronavirus 2 NEGATIVE NEGATIVE Final    Comment: (NOTE) SARS-CoV-2 target nucleic acids are NOT DETECTED.  The SARS-CoV-2 RNA is generally detectable in upper and lower respiratory specimens during the acute phase of infection. The lowest concentration of SARS-CoV-2 viral copies this assay can detect is 250 copies / mL. A negative result does not preclude SARS-CoV-2 infection and should not be used as the sole basis for treatment or other patient management decisions.  A negative result may occur with improper specimen collection / handling, submission of specimen other than nasopharyngeal swab, presence of viral mutation(s) within the areas targeted by this assay, and inadequate number of viral copies (<250 copies / mL). A negative result must be combined with clinical observations, patient history, and epidemiological information.  Fact Sheet for Patients:   StrictlyIdeas.no  Fact Sheet for Healthcare Providers: BankingDealers.co.za  This test is not yet approved or  cleared by the [place] FDA and has been authorized for detection and/or diagnosis of SARS-CoV-2 by FDA under an Emergency Use Authorization (EUA).  This EUA will remain in effect (meaning this test can be used) for the duration of the COVID-19  declaration under Section 564(b)(1) of the Act, 21 U.S.C. section 360bbb-3(b)(1), unless the authorization is terminated or revoked sooner.  Performed at [facility], [place] [address] [place], [place] [address]      Time coordinating discharge: 35 minutes  SIGNED: Antonieta Pert, MD  [facility] 02/03/2020, 11:19 AM  If 7PM-7AM, please contact night-coverage [email or web address]

## 2020-02-03 NOTE — TOC Transition Note (Signed)
Transition of Care [facility]) - CM/SW Discharge Note   Patient Details  Name: Alyssa Carey MRN: [id number] Date of Birth: 1970/12/31  [facility] CM/SW Contact:  Ross Ludwig, LCSW Phone Number: 02/03/2020, 1:03 PM   Clinical Narrative:     Patient to be d/c'ed today to [facility], [place], [place], [place] [address].  Patient and family agreeable to plans will transport via ems RN to call report to [phone].  Patient to notify family of updated discharge plan.      Final next level of care: Skilled Nursing Facility Barriers to Discharge: Barriers Resolved   Patient Goals and CMS Choice Patient states their goals for this hospitalization and ongoing recovery are:: To go to SNF then eventually return back home. CMS Medicare.gov Compare Post Acute Care list provided to:: Patient Choice offered to / list presented to : Patient  Discharge Placement PASRR number recieved: 02/02/20            Patient chooses bed at: Other - please specify in the comment section below: ([facility]) Patient to be transferred to facility by: [facility] Name of family member notified: Patient to notify her family members Patient and family notified of of transfer: 02/03/20  Discharge Plan and Services In-house Referral: Development worker, community Discharge Planning Services: CM Consult Post Acute Care Choice: [facility]                               Social Determinants of Health (SDOH) Interventions     Readmission Risk Interventions No flowsheet data found.

## 2020-02-03 NOTE — Consult Note (Signed)
[place] Nurse ostomy follow up Stoma type/location: LLQ, end colostomy Stomal assessment/size: 1/2" x 1" oval shaped, flush, in deep abdominal crease when patient sitting up Peristomal assessment: NA Treatment options for stomal/peristomal skin:  Output liquid, green, gelatinous Ostomy pouching: 1pc.flat in place, discussed with bedside nurse. Patient should always be placed in 1pc flex convex pouch with a barrier ring.  Pouch changed several times, since intact will leave in place. Plans for DC today Education provided: patient is able to verbalize steps in pouch change. Left educational materials in the room with patient. 8 pouching systems to be sent with patient at the time of DC along with pattern for cutting skin barrier Enrolled patient in [place] Start Discharge program: Yes  [place] Nurse will follow along with you for continued support with ostomy teaching and care [place] MSN, [place], [place], [place], [place]

## 2020-02-03 NOTE — Progress Notes (Signed)
  Speech Language Pathology Treatment: Dysphagia  Patient Details Name: Alyssa Carey MRN: [id number] DOB: Sep 24, 1970 Today's Date: 02/03/2020 Time: 1206-1228 SLP Time Calculation (min) (ACUTE ONLY): 22 min  Assessment / Plan / Recommendation Clinical Impression  Pt seen to initiate RMST - focusing on Expiratory Muscle Strength Training using [employer] (Threshold PEP).  Clinically level set based on pt's subjective effort with goal overall of 7/10 effort to maximum level 20 cm H20 pressure, pt performed 35 reps with pt reporting effort being 5 or 6. Pt stated "I can feel that in my abdomen" and subjectively she appeared with decreased effort as exercise progressed.  SLP provided pt with education re: its use and advised to continue 35 repetitions TID.  Using teach back, pt agreeable to perform and demonstrated with Mod i.  Pt is on a regular/thin diet and advised she is tolerating well.   No SLP follow up indicated as pt has made excellent progress.      HPI HPI: 49 year old with alcohol abuse, alcohol withdrawal, necrotizing fasciitis of left buttock status post debridement. Acute respiratory failure secondary to septic shock. Intubated from 6/8 to 6/23.   Pt underwent MBS on Saturday 01/09/2020 with findings of infrequent and trace, silent aspiration of nectar and thin.  Recommended to start diet of dys1/small, controlled sips of nectar.  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  ENT advised pt high aspiration risk.  Pt has made remarkable progress during      SLP Plan  All goals met       Recommendations  Diet recommendations: Regular;Thin liquid Liquids provided via: Cup;Straw Medication Administration:  Whole meds with liquid Supervision: Patient able to self feed;Intermittent supervision to cue for compensatory strategies Compensations: Slow rate;Small sips/bites Postural Changes and/or Swallow Maneuvers: Seated upright 90 degrees;Upright 30-60 min after meal                Oral Care Recommendations: Oral care QID Follow up Recommendations: None SLP Visit Diagnosis: Dysphagia, oropharyngeal phase (R13.12) Plan: All goals met       GO                Macario Golds 02/03/2020, 12:58 PM   Kathleen Lime, MS [place] Office [phone]

## 2020-02-03 NOTE — Progress Notes (Signed)
Pt explained and provided written discharge instructions. The facility, [facility], was also sent a copy. Report called to Graylon Good, RN at facility. IV was removed. Wound dressing was changed. Colostomy bag was changed yesterday. This RN checked colostomy and was intact and clean. The extra colostomy batgs provided by wound care were given to the patient, also. Pt given pneumococcal vaccine prior to discharge. Pt belongings given to family member in room who is taking them to the facility. Pt had no questions or concerns.

## 2020-02-03 NOTE — Progress Notes (Signed)
Nutrition Follow-up  DOCUMENTATION CODES:   Obesity unspecified  INTERVENTION:  - if patient unable to d/c today, recommend replace small bore NGT and provide nocturnal TF in order to meet estimated nutrition needs.  - will d/c Dillard [place]. - will order Ensure Enlive BID, each supplement provides 350 kcal and 20 grams protein. - continue 30 ml prostat BID, each supplement provides 100 kcal and 15 grams protein. - continue Juven BID, each packet provides 95 calories, 2.5 grams of protein (collagen), and 9.8 grams of carbohydrate (3 grams sugar); also contains 7 grams of L-arginine and L-glutamine, 300 mg vitamin C, 15 mg vitamin E, 1.2 mcg vitamin B-12, 9.5 mg zinc, 200 mg calcium, and 1.5 g  Calcium Beta- hydroxy-Beta-methylbutyrate to support wound healing.   NUTRITION DIAGNOSIS:   Increased nutrient needs related to chronic illness, wound healing (alcohol abuse; necrotizing fasciitis) as evidenced by estimated needs -ongoing  GOAL:   Patient will meet greater than or equal to 90% of their needs -unmet  MONITOR:   PO intake, Supplement acceptance, Labs, Weight trends, Skin  ASSESSMENT:   Pt with PMH of heavy alcohol history who for one week with decreased intake admitted 6/6 with alcoholic hepatitis with hepatic encephalopathy and AKI.  Significant Events: 6/7-admission; initial RD assessment 6/8-Rapid Response; CT pelvis to r/o necrotizing fascitis--found to be positive for necrotizing fascitis and went to OR for I&D 6/9-remained intubated with OGT in place; started TF; added ascorbic acid BID and zinc once/day.  6/10-return to OR for I&D; lap loop colostomy 6/16-vomiting overnight; TF held and OGT to LIS 6/17-vomiting overnight; OGT removed d/t aspiration event and being partly pulled; small bore NGT placed 6/18-restarted TF at trickle rate 6/21- small bore NGT removed; OGT placed 6/23- extubated; OGT removed; MBS completed and diet advanced to Dysphagia 1,  nectar-thick 6/27- s/p flex sig 6/28- NGT placed in R nare; diet changed to NPO 6/30- diet advanced to FLD, nectar-thick 7/8- transferred from SDU to Progressive care 7/12- Surgery signed off 7/19- diet advanced from Dysphagia 3, thin liquids to Regular, thin liquids   She has been consuming 25% of meals over the past 1 week. She has mainly refused Dillard [place] and requesting chocolate Ensure instead. She has been accepting Prostat and Juven 75-90% of the time.   Weight continues to trend down. Weight on 7/15 was 175 lb, weight on 7/17 was 174 lb, and weight on 7/18 was 142 lb. She has not been weighed since 7/18.  Discharge order and discharge summary entered late this AM for planned d/c to SNF.   Labs reviewed; creatinine: 0.31 mg/dl. Medications reviewed; 500 mg ascorbic acid BID, 325 mg ferrous sulfate BID, 1 mg folvite/day, 3 mg melatonin/night, 1 tablet multivitamin with minerals/day, 40 mg oral protonix/day, 100 mg thiamine/day, 50000 units drisdol every Friday, 220 mg zinc sulfate/day.    Diet Order:   Diet Order            Diet - low sodium heart healthy           Diet regular Room service appropriate? Yes; Fluid consistency: Thin  Diet effective now                 EDUCATION NEEDS:   Education needs have been addressed  Skin:  Skin Assessment:  (necrotizing fasciitis to L buttock) Skin Integrity Issues:: Stage III, Other (Comment), Incisions DTI: R ear Stage II: NA Stage III: R ear Unstageable: NA Incisions: L buttocks, abdomen Other: non-pressure wound abdomen and buttocks  Last BM:  7/20  Height:   Ht Readings from Last 1 Encounters:  01/05/20 5' 4"  (1.626 m)    Weight:   Wt Readings from Last 1 Encounters:  02/01/20 64.5 kg     Estimated Nutritional Needs:  Kcal:  2500-2700 kcal Protein:  160-175 grams Fluid:  >/= 2.3 L/day     Jarome Matin, MS, RD, LDN, CNSC Inpatient Clinical Dietitian RD pager # available in [facility]  After  hours/weekend pager # available in [facility]

## 2020-05-31 ENCOUNTER — Encounter (HOSPITAL_COMMUNITY): Payer: Self-pay | Admitting: Emergency Medicine

## 2020-05-31 ENCOUNTER — Other Ambulatory Visit: Payer: Self-pay

## 2020-05-31 ENCOUNTER — Emergency Department (HOSPITAL_COMMUNITY)
Admission: EM | Admit: 2020-05-31 | Discharge: 2020-05-31 | Disposition: A | Discharge status: Home / Self Care | Payer: Medicaid Other | Attending: Emergency Medicine | Admitting: Emergency Medicine

## 2020-05-31 DIAGNOSIS — G8918 Other acute postprocedural pain: Secondary | ICD-10-CM | POA: Insufficient documentation

## 2020-05-31 DIAGNOSIS — R109 Unspecified abdominal pain: Secondary | ICD-10-CM | POA: Insufficient documentation

## 2020-05-31 DIAGNOSIS — K9401 Colostomy hemorrhage: Secondary | ICD-10-CM | POA: Insufficient documentation

## 2020-05-31 DIAGNOSIS — Z433 Encounter for attention to colostomy: Secondary | ICD-10-CM

## 2020-05-31 LAB — CBC WITH DIFFERENTIAL/PLATELET
Basophils Absolute: 0 10*3/uL (ref 0.0–0.1)
Basophils Relative: 0 %
Eosinophils Absolute: 0.4 10*3/uL (ref 0.0–0.5)
Eosinophils Relative: 11 %
HCT: 46.7 % — ABNORMAL HIGH (ref 36.0–46.0)
Hemoglobin: 15.4 g/dL — ABNORMAL HIGH (ref 12.0–15.0)
Lymphocytes Relative: 29 %
Lymphs Abs: 1 10*3/uL (ref 0.7–4.0)
MCH: 30.5 pg (ref 26.0–34.0)
MCHC: 33 g/dL (ref 30.0–36.0)
MCV: 92.5 fL (ref 80.0–100.0)
Monocytes Absolute: 0.3 10*3/uL (ref 0.1–1.0)
Monocytes Relative: 8 %
Neutro Abs: 1.8 10*3/uL (ref 1.7–7.7)
Neutrophils Relative %: 52 %
Platelets: 209 10*3/uL (ref 150–400)
RBC: 5.05 MIL/uL (ref 3.87–5.11)
RDW: 18 % — ABNORMAL HIGH (ref 11.5–15.5)
WBC: 3.5 10*3/uL — ABNORMAL LOW (ref 4.0–10.5)
nRBC: 0 % (ref 0.0–0.2)

## 2020-05-31 LAB — BASIC METABOLIC PANEL
Anion gap: 16 — ABNORMAL HIGH (ref 5–15)
BUN: <5 mg/dL — ABNORMAL LOW (ref 6–20)
CO2: 18 mmol/L — ABNORMAL LOW (ref 22–32)
Calcium: 8.6 mg/dL — ABNORMAL LOW (ref 8.9–10.3)
Chloride: 95 mmol/L — ABNORMAL LOW (ref 98–111)
Creatinine, Ser: 0.49 mg/dL (ref 0.44–1.00)
GFR, Estimated: >60 mL/min (ref >60)
Glucose, Bld: 61 mg/dL — ABNORMAL LOW (ref 70–99)
Potassium: 4.2 mmol/L (ref 3.5–5.1)
Sodium: 129 mmol/L — ABNORMAL LOW (ref 135–145)

## 2020-05-31 NOTE — ED Triage Notes (Signed)
Pt reports she had a colostomy surgery in 12/2019; pt c/o bleeding to the skin around stoma x yesterday

## 2020-05-31 NOTE — ED Provider Notes (Signed)
[facility] EMERGENCY DEPARTMENT Provider Note   CSN: [id number] Arrival date & time: 05/31/20  2033     History Chief Complaint  Patient presents with  . Post-op Problem    Alyssa Carey is a 49 y.o. female.  HPI  Patient presents with concern of bleeding and irritation around colostomy site.  Patient has a notable history of hospitalization in June of this year for sepsis, necrotizing fasciitis, requiring colostomy.  She notes that since that time she has had difficulty with Medicaid, access, and has been doing colostomy care at home, has not followed up in the clinic. Yesterday she had an episode of bleeding in the skin surrounding the colostomy.  No fever, no change in baseline pain in that area which is sharp, moderate, persistent.  There continues to be stool produced through the colostomy itself. She presents today due to ongoing pain in that area, concern for excoriation about the colostomy bag.  EMR notes Brief/Interim Summary: 49 year old female who presented secondary to concern for alcohol withdrawal and quickly developed evidence of necrotizing fasciitis, emergently managed in the OR with critical care, general surgery with I&D.  Patient was in ICU needing intubation and vasopressors for septic shock underwent repeat I&D and diverting colostomy.  She has been extubated and off vasopressors. remains in the stepdown unit.  Wound is slowly healing being managed by surgery wound care and had been on hydrotherapy Monday Wednesday Friday.  Followed by speech therapy oral intake inadequate on modified diet and on NG tube feeding. Patient is slowly improving. She had evidence of fluid overload with weight increased up to 260 pound from admission weight of 193-212 lb-placed on IV Lasix with good output and improving weight and switched to oral Lasix, wt down to 220 .   History reviewed. No pertinent past medical history.  Patient Active Problem List   Diagnosis Date Noted  .  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 Aphonia 01/12/2020  . 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 Hypernatremia 01/12/2020  . Anasarca 01/12/2020  . Necrotizing fasciitis of pelvic region and thigh ([place]) 12/28/2019  . 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 Acute respiratory failure with hypoxemia ([place])   . Elevated LFTs   . Pressure injury of skin 12/22/2019  . Fall at home, initial encounter 12/21/2019  . Weakness 12/21/2019  . Alcoholism ([place]) 12/21/2019  . Thrombocytopenia ([place]) 12/21/2019  . ARF (acute renal failure) ([place]) 12/21/2019  . Hepatic encephalopathy ([place]) 12/21/2019  . Alcoholic liver disease ([place]) 12/21/2019    Past Surgical History:  Procedure Laterality Date  . FLEXIBLE SIGMOIDOSCOPY N/A 01/11/2020   Procedure: FLEXIBLE SIGMOIDOSCOPY;  Surgeon: Carol Ada, MD;  Location: [facility];  Service: Gastroenterology;  Laterality: N/A;  . INCISION AND DRAINAGE PERIRECTAL ABSCESS N/A 12/25/2019   Procedure: IRRIGATION AND DEBRIDEMENT BUTTOCKS, LAP LOOP COLOSTOMY;  Surgeon: Greer Pickerel, MD;  Location: [facility];  Service: General;  Laterality: N/A;  . IRRIGATION AND DEBRIDEMENT ABSCESS N/A 12/23/2019   Procedure: EXCISION AND DEBRIDEMENT LEFT BUTTOCK AND PERINEUM;  Surgeon: Clovis Riley, MD;  Location: [facility];  Service: General;  Laterality: N/A;  . LAPAROSCOPIC LOOP COLOSTOMY N/A 12/25/2019   Procedure: LAPAROSCOPIC LOOP COLOSTOMY;  Surgeon: Greer Pickerel, MD;  Location: [facility];  Service: General;  Laterality: N/A;     OB History   No obstetric history on file.     History reviewed. No pertinent family history.  Social History  Tobacco Use  . Smoking status: Never Smoker  . Smokeless tobacco: Never Used  Vaping Use  . Vaping Use: Never used  Substance Use Topics  . Alcohol use: Not Currently  . Drug use: Not Currently    Home Medications Prior to Admission medications   Medication Sig Start  Date End Date Taking? Authorizing Provider  acetaminophen (TYLENOL) 325 MG tablet Take 650 mg by mouth every 6 (six) hours as needed.   Yes [provider]  oxyCODONE (OXY IR/ROXICODONE) 5 MG immediate release tablet Take by mouth. 03/06/20  Yes [provider]  Amino Acids-Protein Hydrolys (FEEDING SUPPLEMENT, PRO-STAT SUGAR FREE 64,) LIQD Take 30 mLs by mouth 2 (two) times daily between meals. Patient not taking: Reported on 05/31/2020 02/03/20   Antonieta Pert, MD  ascorbic acid (VITAMIN C) 500 MG tablet Take 1 tablet (500 mg total) by mouth 2 (two) times daily. Patient not taking: Reported on 05/31/2020 02/03/20   Antonieta Pert, MD  collagenase (SANTYL) ointment Apply topically daily. Patient not taking: Reported on 05/31/2020 02/04/20   Antonieta Pert, MD  diphenhydramine-acetaminophen (TYLENOL PM) 25-500 MG TABS tablet Take 1 tablet by mouth at bedtime as needed (pain). Patient not taking: Reported on 05/31/2020    [provider]  feeding supplement, ENSURE ENLIVE, (ENSURE ENLIVE) LIQD Take 237 mLs by mouth 2 (two) times daily between meals. Patient not taking: Reported on 05/31/2020 02/03/20   Antonieta Pert, MD  ferrous sulfate 27 MG TABS Take 11.1111 tablets (300 mg total) by mouth 2 (two) times daily with a meal. Patient not taking: Reported on 05/31/2020 02/03/20   Antonieta Pert, MD  folic acid (FOLVITE) 1 MG tablet Take 1 tablet (1 mg total) by mouth daily. Patient not taking: Reported on 05/31/2020 02/04/20   Antonieta Pert, MD  ibuprofen (ADVIL) 200 MG tablet Take 200 mg by mouth every 6 (six) hours as needed for moderate pain. Patient not taking: Reported on 05/31/2020    [provider]  melatonin 3 MG TABS tablet Take 1 tablet (3 mg total) by mouth at bedtime. Patient not taking: Reported on 05/31/2020 02/03/20   Antonieta Pert, MD  metoprolol tartrate (LOPRESSOR) 25 MG tablet Take 1 tablet (25 mg total) by mouth 2 (two) times daily. Patient not taking: Reported on  05/31/2020 02/03/20   Antonieta Pert, MD  mineral oil-hydrophilic petrolatum (AQUAPHOR) ointment Apply topically 3 (three) times daily. Patient not taking: Reported on 05/31/2020 02/03/20   Antonieta Pert, MD  Multiple Vitamin (MULTIVITAMIN WITH MINERALS) TABS tablet Take 1 tablet by mouth daily. Patient not taking: Reported on 05/31/2020 02/04/20   Antonieta Pert, MD  nutrition supplement, JUVEN, (JUVEN) PACK Take 1 packet by mouth 2 (two) times daily between meals. Patient not taking: Reported on 05/31/2020 02/03/20   Antonieta Pert, MD  pantoprazole (PROTONIX) 40 MG tablet Take 1 tablet (40 mg total) by mouth daily. Patient not taking: Reported on 05/31/2020 02/04/20   Antonieta Pert, MD  thiamine 100 MG tablet Take 1 tablet (100 mg total) by mouth daily. Patient not taking: Reported on 05/31/2020 02/04/20   Antonieta Pert, MD  traZODone (DESYREL) 100 MG tablet Take 1 tablet (100 mg total) by mouth at bedtime as needed for sleep. Patient not taking: Reported on 05/31/2020 02/03/20   Antonieta Pert, MD    Allergies    Penicillins  Review of Systems   Review of Systems  Constitutional:       Per HPI, otherwise negative  HENT:  Per HPI, otherwise negative  Respiratory:       Per HPI, otherwise negative  Cardiovascular:       Per HPI, otherwise negative  Gastrointestinal: Negative for vomiting.  Endocrine:       Negative aside from HPI  Genitourinary:       Neg aside from HPI   Musculoskeletal:       Per HPI, otherwise negative  Skin: Positive for wound.  Neurological: Negative for syncope.    Physical Exam Updated Vital Signs BP 121/83 (BP Location: Right Arm)   Pulse 78   Temp 97.9 F (36.6 C) (Oral)   Resp 18   Ht 5' 4"  (1.626 m)   Wt 76.7 kg   SpO2 100%   BMI 29.03 kg/m   Physical Exam Vitals and nursing note reviewed.  Constitutional:      General: She is not in acute distress.    Appearance: She is well-developed.  HENT:     Head: Normocephalic and atraumatic.  Eyes:      Conjunctiva/sclera: Conjunctivae normal.  Cardiovascular:     Rate and Rhythm: Normal rate and regular rhythm.  Pulmonary:     Effort: Pulmonary effort is normal. No respiratory distress.     Breath sounds: Normal breath sounds. No stridor.  Abdominal:     General: There is no distension.     Tenderness: There is no abdominal tenderness.       Comments: Stool is visible from the stoma  Skin:    General: Skin is warm and dry.  Neurological:     Mental Status: She is alert and oriented to person, place, and time.     Cranial Nerves: No cranial nerve deficit.     ED Results / Procedures / Treatments   Labs (all labs ordered are listed, but only abnormal results are displayed) Labs Reviewed  BASIC METABOLIC PANEL - Abnormal; Notable for the following components:      Result Value   Sodium 129 (*)    Chloride 95 (*)    CO2 18 (*)    Glucose, Bld 61 (*)    BUN <5 (*)    Calcium 8.6 (*)    Anion gap 16 (*)    All other components within normal limits  CBC WITH DIFFERENTIAL/PLATELET - Abnormal; Notable for the following components:   WBC 3.5 (*)    Hemoglobin 15.4 (*)    HCT 46.7 (*)    RDW 18.0 (*)    All other components within normal limits     Procedures Procedures (including critical care time)  Patient had wound care provided, colostomy bag applied by our nursing colleagues following my initial evaluation.  Medications Ordered in ED Medications - No data to display  ED Course  I have reviewed the triage vital signs and the nursing notes.  Pertinent labs & imaging results that were available during my care of the patient were reviewed by me and considered in my medical decision making (see chart for details).  Adult female presents with issues of colostomy and abdominal discomfort surrounding the site.  No evidence for bacteremia, sepsis, no substantial tenderness palpation around the stoma, given the reduction of of stool throughout, low suspicion for  obstruction.  Some suspicion for the patient's issues being secondary to difficulty with follow-up, appropriate colostomy care.  With otherwise reassuring labs, physical exam, vital signs, patient is appropriate for discharge with resources to follow-up with our surgery colleagues to ensure appropriate ongoing care. Final Clinical Impression(s) /  ED Diagnoses Final diagnoses:  Postoperative pain  Colostomy care [facility])     Carmin Muskrat, MD 05/31/20 2330

## 2020-05-31 NOTE — ED Notes (Signed)
Ostomy bag placed, skin protective wipes used, adapt powder used,  Pt tolerated well.

## 2020-05-31 NOTE — Discharge Instructions (Signed)
As discussed, your evaluation today has been largely reassuring.  But, it is important that you monitor your condition carefully, and do not hesitate to return to the ED if you develop new, or concerning changes in your condition. ? ?Otherwise, please follow-up with your physician for appropriate ongoing care. ? ?

## 2020-05-31 NOTE — ED Notes (Signed)
Pt ambulatory to er room number 8, pt c/o some excoriation to her ostomy site, site appears intact with some skin breakdown surrounding.  Appears to be functioning appropriately.

## 2020-09-17 ENCOUNTER — Telehealth: Payer: Self-pay | Admitting: Gastroenterology

## 2020-09-17 NOTE — Telephone Encounter (Signed)
Hi Dr. Fuller Plan,   We received a referral for patient from [facility] to have a colonoscopy is currently a patient of Dr. Benson [place] records are in Epic for review.  Please advise on scheduling.  Thank you

## 2020-09-17 NOTE — Telephone Encounter (Signed)
I am unable to accommodate a transfer of care.

## 2020-10-05 ENCOUNTER — Emergency Department (HOSPITAL_COMMUNITY): Payer: Self-pay

## 2020-10-05 ENCOUNTER — Encounter (HOSPITAL_COMMUNITY): Payer: Self-pay | Admitting: Emergency Medicine

## 2020-10-05 ENCOUNTER — Inpatient Hospital Stay (HOSPITAL_COMMUNITY)
Admission: EM | Admit: 2020-10-05 | Discharge: 2020-10-11 | DRG: 378 | Disposition: A | Discharge status: Home / Self Care | Payer: Self-pay | Attending: Internal Medicine | Admitting: Internal Medicine

## 2020-10-05 ENCOUNTER — Other Ambulatory Visit: Payer: Self-pay

## 2020-10-05 DIAGNOSIS — R7989 Other specified abnormal findings of blood chemistry: Secondary | ICD-10-CM

## 2020-10-05 DIAGNOSIS — T40695A Adverse effect of other narcotics, initial encounter: Secondary | ICD-10-CM | POA: Diagnosis present

## 2020-10-05 DIAGNOSIS — K709 Alcoholic liver disease, unspecified: Secondary | ICD-10-CM | POA: Diagnosis present

## 2020-10-05 DIAGNOSIS — K435 Parastomal hernia without obstruction or  gangrene: Secondary | ICD-10-CM | POA: Diagnosis present

## 2020-10-05 DIAGNOSIS — K259 Gastric ulcer, unspecified as acute or chronic, without hemorrhage or perforation: Secondary | ICD-10-CM | POA: Diagnosis present

## 2020-10-05 DIAGNOSIS — K5100 Ulcerative (chronic) pancolitis without complications: Secondary | ICD-10-CM

## 2020-10-05 DIAGNOSIS — K449 Diaphragmatic hernia without obstruction or gangrene: Secondary | ICD-10-CM | POA: Diagnosis present

## 2020-10-05 DIAGNOSIS — K921 Melena: Principal | ICD-10-CM | POA: Diagnosis present

## 2020-10-05 DIAGNOSIS — E441 Mild protein-calorie malnutrition: Secondary | ICD-10-CM | POA: Diagnosis present

## 2020-10-05 DIAGNOSIS — L298 Other pruritus: Secondary | ICD-10-CM | POA: Diagnosis present

## 2020-10-05 DIAGNOSIS — K626 Ulcer of anus and rectum: Secondary | ICD-10-CM | POA: Diagnosis present

## 2020-10-05 DIAGNOSIS — Z79899 Other long term (current) drug therapy: Secondary | ICD-10-CM

## 2020-10-05 DIAGNOSIS — K922 Gastrointestinal hemorrhage, unspecified: Secondary | ICD-10-CM | POA: Diagnosis present

## 2020-10-05 DIAGNOSIS — R7401 Elevation of levels of liver transaminase levels: Secondary | ICD-10-CM | POA: Diagnosis present

## 2020-10-05 DIAGNOSIS — D62 Acute posthemorrhagic anemia: Secondary | ICD-10-CM | POA: Diagnosis present

## 2020-10-05 DIAGNOSIS — Z20822 Contact with and (suspected) exposure to covid-19: Secondary | ICD-10-CM | POA: Diagnosis present

## 2020-10-05 DIAGNOSIS — K433 Parastomal hernia with obstruction, without gangrene: Secondary | ICD-10-CM

## 2020-10-05 DIAGNOSIS — F10239 Alcohol dependence with withdrawal, unspecified: Secondary | ICD-10-CM | POA: Diagnosis present

## 2020-10-05 DIAGNOSIS — Z933 Colostomy status: Secondary | ICD-10-CM

## 2020-10-05 DIAGNOSIS — D539 Nutritional anemia, unspecified: Secondary | ICD-10-CM | POA: Diagnosis present

## 2020-10-05 DIAGNOSIS — R109 Unspecified abdominal pain: Secondary | ICD-10-CM

## 2020-10-05 DIAGNOSIS — R748 Abnormal levels of other serum enzymes: Secondary | ICD-10-CM

## 2020-10-05 DIAGNOSIS — I10 Essential (primary) hypertension: Secondary | ICD-10-CM | POA: Diagnosis present

## 2020-10-05 HISTORY — DX: Essential (primary) hypertension: I10

## 2020-10-05 LAB — CBC WITH DIFFERENTIAL/PLATELET
Abs Immature Granulocytes: 0.02 10*3/uL (ref 0.00–0.07)
Basophils Absolute: 0 10*3/uL (ref 0.0–0.1)
Basophils Relative: 1 %
Eosinophils Absolute: 0.1 10*3/uL (ref 0.0–0.5)
Eosinophils Relative: 2 %
HCT: 37 % (ref 36.0–46.0)
Hemoglobin: 12.2 g/dL (ref 12.0–15.0)
Immature Granulocytes: 1 %
Lymphocytes Relative: 44 %
Lymphs Abs: 1.7 10*3/uL (ref 0.7–4.0)
MCH: 34.2 pg — ABNORMAL HIGH (ref 26.0–34.0)
MCHC: 33 g/dL (ref 30.0–36.0)
MCV: 103.6 fL — ABNORMAL HIGH (ref 80.0–100.0)
Monocytes Absolute: 0.5 10*3/uL (ref 0.1–1.0)
Monocytes Relative: 13 %
Neutro Abs: 1.4 10*3/uL — ABNORMAL LOW (ref 1.7–7.7)
Neutrophils Relative %: 39 %
Platelets: 184 10*3/uL (ref 150–400)
RBC: 3.57 MIL/uL — ABNORMAL LOW (ref 3.87–5.11)
RDW: 12.2 % (ref 11.5–15.5)
WBC: 3.7 10*3/uL — ABNORMAL LOW (ref 4.0–10.5)
nRBC: 0 % (ref 0.0–0.2)

## 2020-10-05 LAB — RESP PANEL BY RT-PCR (FLU A&B, COVID) ARPGX2
Influenza A by PCR: NEGATIVE
Influenza B by PCR: NEGATIVE
SARS Coronavirus 2 by RT PCR: NEGATIVE

## 2020-10-05 LAB — TYPE AND SCREEN
ABO/RH(D): A POS
Antibody Screen: NEGATIVE

## 2020-10-05 LAB — COMPREHENSIVE METABOLIC PANEL
ALT: 162 U/L — ABNORMAL HIGH (ref 0–44)
AST: 714 U/L — ABNORMAL HIGH (ref 15–41)
Albumin: 3.1 g/dL — ABNORMAL LOW (ref 3.5–5.0)
Alkaline Phosphatase: 359 U/L — ABNORMAL HIGH (ref 38–126)
Anion gap: 12 (ref 5–15)
BUN: <5 mg/dL — ABNORMAL LOW (ref 6–20)
CO2: 25 mmol/L (ref 22–32)
Calcium: 8.6 mg/dL — ABNORMAL LOW (ref 8.9–10.3)
Chloride: 100 mmol/L (ref 98–111)
Creatinine, Ser: 0.4 mg/dL — ABNORMAL LOW (ref 0.44–1.00)
GFR, Estimated: >60 mL/min (ref >60)
Glucose, Bld: 108 mg/dL — ABNORMAL HIGH (ref 70–99)
Potassium: 3.7 mmol/L (ref 3.5–5.1)
Sodium: 137 mmol/L (ref 135–145)
Total Bilirubin: 0.5 mg/dL (ref 0.3–1.2)
Total Protein: 7 g/dL (ref 6.5–8.1)

## 2020-10-05 LAB — AMMONIA: Ammonia: 40 umol/L — ABNORMAL HIGH (ref 9–35)

## 2020-10-05 LAB — POC URINE PREG, ED: Preg Test, Ur: NEGATIVE

## 2020-10-05 LAB — PROTIME-INR
INR: 1 (ref 0.8–1.2)
Prothrombin Time: 12.5 seconds (ref 11.4–15.2)

## 2020-10-05 MED ORDER — SODIUM CHLORIDE 0.9 % IV BOLUS
1000.0000 mL | Freq: Once | INTRAVENOUS | Status: AC
  Administered 2020-10-05: 1000 mL via INTRAVENOUS

## 2020-10-05 MED ORDER — SODIUM CHLORIDE 0.9 % IV SOLN
80.0000 mg | Freq: Once | INTRAVENOUS | Status: AC
  Administered 2020-10-05: 80 mg via INTRAVENOUS
  Filled 2020-10-05: qty 80

## 2020-10-05 MED ORDER — IOHEXOL 300 MG/ML  SOLN
100.0000 mL | Freq: Once | INTRAMUSCULAR | Status: AC | PRN
  Administered 2020-10-05: 100 mL via INTRAVENOUS

## 2020-10-05 MED ORDER — SODIUM CHLORIDE 0.9 % IV SOLN
8.0000 mg/h | INTRAVENOUS | Status: DC
  Administered 2020-10-05: 8 mg/h via INTRAVENOUS
  Filled 2020-10-05 (×3): qty 80

## 2020-10-05 NOTE — ED Triage Notes (Signed)
Pt tot he ED with blood in her stool in the colostomy bag.  Pt states she has had the colostomy since June 2021 without issues.  Pt also c/o LUQ abdominal pain.

## 2020-10-05 NOTE — ED Provider Notes (Signed)
[facility] EMERGENCY DEPARTMENT Provider Note   CSN: [id number] Arrival date & time: 10/05/20  1331     History Chief Complaint  Patient presents with  . Blood In Colostomy Bag    Alyssa Carey is a 50 y.o. female with a past medical history of alcoholic liver disease status post diverting colostomy after development of sepsis and necrotizing fasciitis in June 2021.  Patient states that she had normal stool in her ostomy bag today and then around 11 AM noticed that the bladder was full of clots and blood.  She came to the emergency department for further evaluation and since being here states that she has had to empty the bag 4 times filled with blood and clots.  She is feeling somewhat weak and lightheaded with standing now.  She states that she has had some mild bleeding from irritation at the colostomy site in the past but has never had any bleeding like this before.  She denies any abdominal pain.  HPI     History reviewed. No pertinent past medical history.  Patient Active Problem List   Diagnosis Date Noted  . 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 Aphonia 01/12/2020  . 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 Hypernatremia 01/12/2020  . Anasarca 01/12/2020  . Necrotizing fasciitis of pelvic region and thigh ([place]) 12/28/2019  . 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 Acute respiratory failure with hypoxemia ([place])   . Elevated LFTs   . Pressure injury of skin 12/22/2019  . Fall at home, initial encounter 12/21/2019  . Weakness 12/21/2019  . Alcoholism ([place]) 12/21/2019  . Thrombocytopenia ([place]) 12/21/2019  . ARF (acute renal failure) ([place]) 12/21/2019  . Hepatic encephalopathy ([place]) 12/21/2019  . Alcoholic liver disease ([place]) 12/21/2019    Past Surgical History:  Procedure Laterality Date  . FLEXIBLE SIGMOIDOSCOPY N/A 01/11/2020   Procedure: FLEXIBLE SIGMOIDOSCOPY;  Surgeon: Carol Ada, MD;  Location: [facility];  Service: Gastroenterology;  Laterality: N/A;  . INCISION AND DRAINAGE PERIRECTAL ABSCESS N/A 12/25/2019   Procedure: IRRIGATION AND DEBRIDEMENT BUTTOCKS, LAP LOOP COLOSTOMY;  Surgeon: Greer Pickerel, MD;  Location: [facility] ORS;  Service: General;  Laterality: N/A;  . IRRIGATION AND DEBRIDEMENT ABSCESS N/A 12/23/2019   Procedure: EXCISION AND DEBRIDEMENT LEFT BUTTOCK AND PERINEUM;  Surgeon: Clovis Riley, MD;  Location: [facility] OR[facility];  Service: General;  Laterality: N/A;  . LAPAROSCOPIC LOOP COLOSTOMY N/A 12/25/2019   Procedure: LAPAROSCOPIC LOOP COLOSTOMY;  Surgeon: Greer Pickerel, MD;  Location: [facility];  Service: General;  Laterality: N/A;     OB History   No obstetric history on file.     History reviewed. No pertinent family history.  Social History   Tobacco Use  . Smoking status: Never Smoker  . Smokeless tobacco: Never Used  Vaping Use  . Vaping Use: Never used  Substance Use Topics  . Alcohol use: Not Currently  . Drug use: Not Currently    Home Medications Prior to Admission medications   Medication Sig Start Date End Date Taking? Authorizing Provider  acetaminophen (TYLENOL) 325 MG tablet Take 650 mg by mouth every 6 (six) hours as needed.   Yes [provider]  ibuprofen (ADVIL) 600 MG tablet Take 600 mg by mouth every 6 (six) hours as needed. 07/25/20  Yes [provider]  Amino Acids-Protein Hydrolys (FEEDING SUPPLEMENT, PRO-STAT SUGAR FREE 64,) LIQD Take 30 mLs by mouth 2 (two) times daily between meals.  Patient not taking: Reported on 05/31/2020 02/03/20   Antonieta Pert, MD  ascorbic acid (VITAMIN C) 500 MG tablet Take 1 tablet (500 mg total) by mouth 2 (two) times daily. Patient not taking: Reported on 05/31/2020 02/03/20   Antonieta Pert, MD  collagenase (SANTYL) ointment Apply topically daily. Patient not taking: Reported on 05/31/2020 02/04/20   Antonieta Pert, MD  diphenhydramine-acetaminophen (TYLENOL PM) 25-500 MG TABS tablet Take 1 tablet by mouth at bedtime  as needed (pain). Patient not taking: Reported on 05/31/2020    [provider]  feeding supplement, ENSURE ENLIVE, (ENSURE ENLIVE) LIQD Take 237 mLs by mouth 2 (two) times daily between meals. Patient not taking: Reported on 05/31/2020 02/03/20   Antonieta Pert, MD  ferrous sulfate 27 MG TABS Take 11.1111 tablets (300 mg total) by mouth 2 (two) times daily with a meal. Patient not taking: Reported on 05/31/2020 02/03/20   Antonieta Pert, MD  folic acid (FOLVITE) 1 MG tablet Take 1 tablet (1 mg total) by mouth daily. Patient not taking: Reported on 05/31/2020 02/04/20   Antonieta Pert, MD  ibuprofen (ADVIL) 200 MG tablet Take 200 mg by mouth every 6 (six) hours as needed for moderate pain. Patient not taking: Reported on 05/31/2020    [provider]  melatonin 3 MG TABS tablet Take 1 tablet (3 mg total) by mouth at bedtime. Patient not taking: Reported on 05/31/2020 02/03/20   Antonieta Pert, MD  metoprolol tartrate (LOPRESSOR) 25 MG tablet Take 1 tablet (25 mg total) by mouth 2 (two) times daily. Patient not taking: Reported on 05/31/2020 02/03/20   Antonieta Pert, MD  mineral oil-hydrophilic petrolatum (AQUAPHOR) ointment Apply topically 3 (three) times daily. Patient not taking: Reported on 05/31/2020 02/03/20   Antonieta Pert, MD  Multiple Vitamin (MULTIVITAMIN WITH MINERALS) TABS tablet Take 1 tablet by mouth daily. Patient not taking: Reported on 05/31/2020 02/04/20   Antonieta Pert, MD  nutrition supplement, JUVEN, (JUVEN) PACK Take 1 packet by mouth 2 (two) times daily between meals. Patient not taking: Reported on 05/31/2020 02/03/20   Antonieta Pert, MD  oxyCODONE (OXY IR/ROXICODONE) 5 MG immediate release tablet Take by mouth. Patient not taking: Reported on 10/05/2020 03/06/20   [provider]  pantoprazole (PROTONIX) 40 MG tablet Take 1 tablet (40 mg total) by mouth daily. Patient not taking: Reported on 05/31/2020 02/04/20   Antonieta Pert, MD  thiamine 100 MG tablet Take 1 tablet (100 mg total)  by mouth daily. Patient not taking: Reported on 05/31/2020 02/04/20   Antonieta Pert, MD  traZODone (DESYREL) 100 MG tablet Take 1 tablet (100 mg total) by mouth at bedtime as needed for sleep. Patient not taking: Reported on 05/31/2020 02/03/20   Antonieta Pert, MD    Allergies    Penicillins  Review of Systems   Review of Systems Ten systems reviewed and are negative for acute change, except as noted in the HPI. \ Physical Exam Updated Vital Signs BP (!) 91/50   Pulse 75   Temp 97.9 F (36.6 C) (Oral)   Resp 20   Ht 4' 9"  (3.546 m)   Wt 78.1 kg   SpO2 100%   BMI 37.26 kg/m   Physical Exam Vitals and nursing note reviewed.  Constitutional:      General: She is not in acute distress.    Appearance: She is well-developed. She is not diaphoretic.  HENT:     Head: Normocephalic and atraumatic.  Eyes:     General: No scleral icterus.  Conjunctiva/sclera: Conjunctivae normal.  Cardiovascular:     Rate and Rhythm: Normal rate and regular rhythm.     Heart sounds: Normal heart sounds. No murmur heard. No friction rub. No gallop.   Pulmonary:     Effort: Pulmonary effort is normal. No respiratory distress.     Breath sounds: Normal breath sounds.  Abdominal:     General: Bowel sounds are normal. There is no distension.     Palpations: Abdomen is soft. There is no mass.     Tenderness: There is no abdominal tenderness. There is no guarding.     Comments: Colostomy bag present in the left side of the abdomen.  The bag is full of blood and clots.  Musculoskeletal:     Cervical back: Normal range of motion.  Skin:    General: Skin is warm and dry.  Neurological:     Mental Status: She is alert and oriented to person, place, and time.  Psychiatric:        Behavior: Behavior normal.     ED Results / Procedures / Treatments   Labs (all labs ordered are listed, but only abnormal results are displayed) Labs Reviewed  COMPREHENSIVE METABOLIC PANEL  CBC WITH DIFFERENTIAL/PLATELET   PROTIME-INR  AMMONIA  TYPE AND SCREEN    EKG None  Radiology No results found.  Procedures Procedures   Medications Ordered in ED Medications  sodium chloride 0.9 % bolus 1,000 mL (has no administration in time range)    ED Course  I have reviewed the triage vital signs and the nursing notes.  Pertinent labs & imaging results that were available during my care of the patient were reviewed by me and considered in my medical decision making (see chart for details).    MDM Rules/Calculators/A&P                          of this is a 50 year old female with a complicated past medical history, alcoholic liver disease and colostomy status post necrotizing fasciotomy with diverting loop colectomy Patient has had multiple episodes of feeling of the colostomy bag with blood and clots.  I ordered and reviewed labs which included CBC which shows hemoglobin of 12.2, CMP with elevated liver enzymes, PT/INR within normal limits, Covid and flu swabs negative.  Urine pregnancy test is negative.  Ammonia is slightly elevated.  I ordered and reviewed a CT abdomen pelvis with contrast that shows pancolitis and a large unobstructed hernia.  Patient has been hemodynamically stable.  She has received fluids and given Protonix bolus and drip.  Patient will be admitted for serial hemoglobins and further evaluation potential consult with GI.  I discussed case with Dr.Zierle-Ghosh who will admit the patient for further evaluation. Final Clinical Impression(s) / ED Diagnoses Final diagnoses:  None    Rx / DC Orders ED Discharge Orders    None       Margarita Mail, PA-C 10/06/20 0004    Daleen Bo, MD 10/07/20 [phone]

## 2020-10-05 NOTE — H&P (Signed)
TRH H&P    Patient Demographics:    Alyssa Carey, is a 50 y.o. female  MRN: [id number]  DOB - 1971/01/05  Admit Date - 10/05/2020  Referring MD/NP/PA: Eulis Foster  Outpatient Primary MD for the patient is [facility], [facility]  Patient coming from: Home  Chief complaint- blood in ostomy bag   HPI:    Alyssa Carey  is a 50 y.o. female, with history of alcohol dependence, necrotizing fasciitis requiring colostomy, hypertension, and more presents to the ED with a chief complaint of blood in the ostomy bag.  Patient was hospitalized in June 2021 for necrotizing fasciitis that required diverting colostomy.  Since that time patient reports that she has not had any complications with her colostomy.  Chart review reveals that she will be seen in the ED November 2021 for postoperative pain.  In July 2021 she had bleeding around the site of her colostomy bag but no bleeding from the bowel.  Today patient reports that 11 AM she noticed her bag was full of dark red blood and clots.  She reports that she has had to empty it completely full bag of dark red blood 3 times.  She reports this is never happened before.  She denies any stomach cramping.  She reports she has had a decreased appetite but that is been going on for 4-5 months.  She has had a 10 to 15 pound weight loss over that time.  She reports that she is doing good to eat 1 full meal a day.  She does drink Pedialyte and protein shakes to make up for it.  So decreased appetite has been ongoing.  Patient has not had any symptoms of anemia including no chest pain, no shortness of breath, no dizziness, no paresthesias.  She reports in the days leading up to the back she has not noticed any black output.  Patient has not had any vomiting, or fevers.  Patient does report that she has been overly fatigued for 3-4 weeks that she is attributed it to poor p.o. intake.  Patient has no  other complaints at this time.  Patient has a history of alcohol abuse but reports that she has not had a drink in 2 years.  She does not smoke, does not use illicit drugs, and is vaccinated for COVID.  Patient is full code.  In the ED Temp 97.9, heart rate 73-91, respiratory rate 16-34, blood pressure as low as 98/71 White blood cell count 3.7, hemoglobin 12.2 Alk phos elevated at 359, AST elevated at 714, ALT elevated 162, ammonia 40, T bili 0.5 CT abdomen pelvis shows pancolitis, diarrheal state correlation with clinical exam and stool cultures recommended.  Left lower quadrant loop colostomy with large parastomal hernia without obstruction.  Fatty liver.   Patient was typed and screened. Admission requested to continue trending hemoglobin    Review of systems:    In addition to the HPI above,  No Fever-chills, No Headache, No changes with Vision or hearing, No problems swallowing food or Liquids, No Chest pain, Cough or  Shortness of Breath, No Abdominal pain, No Nausea or Vomiting, ostomy output has been normal up till today  Positive for blood and stool, no hematuria No dysuria, No new skin rashes or bruises, No new joints pains-aches,  No new weakness, tingling, numbness in any extremity, No recent weight gain or loss, No polyuria, polydypsia or polyphagia, No significant Mental Stressors.  All other systems reviewed and are negative.    Past History of the following :    History reviewed. No pertinent past medical history.    Past Surgical History:  Procedure Laterality Date  . FLEXIBLE SIGMOIDOSCOPY N/A 01/11/2020   Procedure: FLEXIBLE SIGMOIDOSCOPY;  Surgeon: Carol Ada, MD;  Location: [facility];  Service: Gastroenterology;  Laterality: N/A;  . INCISION AND DRAINAGE PERIRECTAL ABSCESS N/A 12/25/2019   Procedure: IRRIGATION AND DEBRIDEMENT BUTTOCKS, LAP LOOP COLOSTOMY;  Surgeon: Greer Pickerel, MD;  Location: [facility];  Service: General;  Laterality: N/A;  .  IRRIGATION AND DEBRIDEMENT ABSCESS N/A 12/23/2019   Procedure: EXCISION AND DEBRIDEMENT LEFT BUTTOCK AND PERINEUM;  Surgeon: Clovis Riley, MD;  Location: [facility];  Service: General;  Laterality: N/A;  . LAPAROSCOPIC LOOP COLOSTOMY N/A 12/25/2019   Procedure: LAPAROSCOPIC LOOP COLOSTOMY;  Surgeon: Greer Pickerel, MD;  Location: [facility];  Service: General;  Laterality: N/A;      Social History:      Social History   Tobacco Use  . Smoking status: Never Smoker  . Smokeless tobacco: Never Used  Substance Use Topics  . Alcohol use: Not Currently       Family History :    History reviewed. No pertinent family history. Family history of hypertension   Home Medications:   Prior to Admission medications   Medication Sig Start Date End Date Taking? Authorizing Provider  acetaminophen (TYLENOL) 325 MG tablet Take 650 mg by mouth every 6 (six) hours as needed.   Yes [provider]  ibuprofen (ADVIL) 600 MG tablet Take 600 mg by mouth every 6 (six) hours as needed. 07/25/20  Yes [provider]  Amino Acids-Protein Hydrolys (FEEDING SUPPLEMENT, PRO-STAT SUGAR FREE 64,) LIQD Take 30 mLs by mouth 2 (two) times daily between meals. Patient not taking: Reported on 05/31/2020 02/03/20   Antonieta Pert, MD  ascorbic acid (VITAMIN C) 500 MG tablet Take 1 tablet (500 mg total) by mouth 2 (two) times daily. Patient not taking: Reported on 05/31/2020 02/03/20   Antonieta Pert, MD  collagenase (SANTYL) ointment Apply topically daily. Patient not taking: Reported on 05/31/2020 02/04/20   Antonieta Pert, MD  diphenhydramine-acetaminophen (TYLENOL PM) 25-500 MG TABS tablet Take 1 tablet by mouth at bedtime as needed (pain). Patient not taking: Reported on 05/31/2020    [provider]  feeding supplement, ENSURE ENLIVE, (ENSURE ENLIVE) LIQD Take 237 mLs by mouth 2 (two) times daily between meals. Patient not taking: Reported on 05/31/2020 02/03/20   Antonieta Pert, MD  ferrous sulfate 27 MG TABS  Take 11.1111 tablets (300 mg total) by mouth 2 (two) times daily with a meal. Patient not taking: Reported on 05/31/2020 02/03/20   Antonieta Pert, MD  folic acid (FOLVITE) 1 MG tablet Take 1 tablet (1 mg total) by mouth daily. Patient not taking: Reported on 05/31/2020 02/04/20   Antonieta Pert, MD  ibuprofen (ADVIL) 200 MG tablet Take 200 mg by mouth every 6 (six) hours as needed for moderate pain. Patient not taking: Reported on 05/31/2020    [provider]  melatonin 3 MG TABS tablet Take 1 tablet (  3 mg total) by mouth at bedtime. Patient not taking: Reported on 05/31/2020 02/03/20   Antonieta Pert, MD  metoprolol tartrate (LOPRESSOR) 25 MG tablet Take 1 tablet (25 mg total) by mouth 2 (two) times daily. Patient not taking: Reported on 05/31/2020 02/03/20   Antonieta Pert, MD  mineral oil-hydrophilic petrolatum (AQUAPHOR) ointment Apply topically 3 (three) times daily. Patient not taking: Reported on 05/31/2020 02/03/20   Antonieta Pert, MD  Multiple Vitamin (MULTIVITAMIN WITH MINERALS) TABS tablet Take 1 tablet by mouth daily. Patient not taking: Reported on 05/31/2020 02/04/20   Antonieta Pert, MD  nutrition supplement, JUVEN, (JUVEN) PACK Take 1 packet by mouth 2 (two) times daily between meals. Patient not taking: Reported on 05/31/2020 02/03/20   Antonieta Pert, MD  oxyCODONE (OXY IR/ROXICODONE) 5 MG immediate release tablet Take by mouth. Patient not taking: Reported on 10/05/2020 03/06/20   [provider]  pantoprazole (PROTONIX) 40 MG tablet Take 1 tablet (40 mg total) by mouth daily. Patient not taking: Reported on 05/31/2020 02/04/20   Antonieta Pert, MD  thiamine 100 MG tablet Take 1 tablet (100 mg total) by mouth daily. Patient not taking: Reported on 05/31/2020 02/04/20   Antonieta Pert, MD  traZODone (DESYREL) 100 MG tablet Take 1 tablet (100 mg total) by mouth at bedtime as needed for sleep. Patient not taking: Reported on 05/31/2020 02/03/20   Antonieta Pert, MD     Allergies:     Allergies   Allergen Reactions  . Penicillins     Did it involve swelling of the face/tongue/throat, SOB, or low BP? N Did it involve sudden or severe rash/hives, skin peeling, or any reaction on the inside of your mouth or nose? Y Did you need to seek medical attention at a hospital or doctor's office? N When did it last happen?Childhood If all above answers are "NO", may proceed with cephalosporin use.      Physical Exam:   Vitals  Blood pressure 98/71, pulse 83, temperature 97.9 F (36.6 C), temperature source Oral, resp. rate 16, height _0  (1.448 m), weight 78.1 kg, SpO2 100 %.  1.  General: Patient lying supine in bed no acute distress  2. Psychiatric: Mood and behavior normal for situation, patient is alert and oriented x3, patient is pleasant and cooperative with exam  3. Neurologic: Face is symmetric, speech and language are normal, moves all 4 extremities voluntarily, no focal deficit on limited exam  4. HEENMT:  Head is atraumatic, normocephalic, pupils reactive to light, neck is supple, trachea is midline, mucous membranes are moist  5. Respiratory : Lungs are clear to auscultation bilaterally without rales, rhonchi, wheezes, no clubbing, no cyanosis  6. Cardiovascular : Heart rate is normal, rhythm is regular, no murmurs rubs or gallops, no peripheral edema  7. Gastrointestinal:  Abdomen is soft, nondistended, tender just below the ostomy site without guarding, no rebound tenderness, negative McBurney's point tenderness, negative Murphy sign  8. Skin:  Skin is warm dry and intact with no acute lesions on limited exam  9.Musculoskeletal:  No acute deformity, no peripheral edema, no calf tenderness    Data Review:    CBC Recent Labs  Lab 10/05/20 1648  WBC 3.7*  HGB 12.2  HCT 37.0  PLT 184  MCV 103.6*  MCH 34.2*  MCHC 33.0  RDW 12.2  LYMPHSABS 1.7  MONOABS 0.5  EOSABS 0.1  BASOSABS 0.0    ------------------------------------------------------------------------------------------------------------------  Results for orders placed or performed during the hospital encounter of 10/05/20 (from the  past 48 hour(s))  Comprehensive metabolic panel     Status: Abnormal   Collection Time: 10/05/20  4:48 PM  Result Value Ref Range   Sodium 137 135 - 145 mmol/L   Potassium 3.7 3.5 - 5.1 mmol/L   Chloride 100 98 - 111 mmol/L   CO2 25 22 - 32 mmol/L   Glucose, Bld 108 (H) 70 - 99 mg/dL    Comment: Glucose reference range applies only to samples taken after fasting for at least 8 hours.   BUN <5 (L) 6 - 20 mg/dL   Creatinine, Ser 0.40 (L) 0.44 - 1.00 mg/dL   Calcium 8.6 (L) 8.9 - 10.3 mg/dL   Total Protein 7.0 6.5 - 8.1 g/dL   Albumin 3.1 (L) 3.5 - 5.0 g/dL   AST 714 (H) 15 - 41 U/L   ALT 162 (H) 0 - 44 U/L   Alkaline Phosphatase 359 (H) 38 - 126 U/L   Total Bilirubin 0.5 0.3 - 1.2 mg/dL   GFR, Estimated >60 >60 mL/min    Comment: (NOTE) Calculated using the CKD-EPI Creatinine Equation (2021)    Anion gap 12 5 - 15    Comment: Performed at [facility], [address] [place], [place] [address]  CBC with Differential     Status: Abnormal   Collection Time: 10/05/20  4:48 PM  Result Value Ref Range   WBC 3.7 (L) 4.0 - 10.5 K/uL   RBC 3.57 (L) 3.87 - 5.11 MIL/uL   Hemoglobin 12.2 12.0 - 15.0 g/dL   HCT 37.0 36.0 - 46.0 %   MCV 103.6 (H) 80.0 - 100.0 fL   MCH 34.2 (H) 26.0 - 34.0 pg   MCHC 33.0 30.0 - 36.0 g/dL   RDW 12.2 11.5 - 15.5 %   Platelets 184 150 - 400 K/uL   nRBC 0.0 0.0 - 0.2 %   Neutrophils Relative % 39 %   Neutro Abs 1.4 (L) 1.7 - 7.7 K/uL   Lymphocytes Relative 44 %   Lymphs Abs 1.7 0.7 - 4.0 K/uL   Monocytes Relative 13 %   Monocytes Absolute 0.5 0.1 - 1.0 K/uL   Eosinophils Relative 2 %   Eosinophils Absolute 0.1 0.0 - 0.5 K/uL   Basophils Relative 1 %   Basophils Absolute 0.0 0.0 - 0.1 K/uL   Immature Granulocytes 1 %   Abs Immature Granulocytes  0.02 0.00 - 0.07 K/uL    Comment: Performed at [facility], [address], [place], [place] [address]  Protime-INR     Status: None   Collection Time: 10/05/20  4:48 PM  Result Value Ref Range   Prothrombin Time 12.5 11.4 - 15.2 seconds   INR 1.0 0.8 - 1.2    Comment: (NOTE) INR goal varies based on device and disease states. Performed at [facility], [address], [place], [place] [address]   Type and screen     Status: None   Collection Time: 10/05/20  4:57 PM  Result Value Ref Range   ABO/RH(D) A POS    Antibody Screen NEG    Sample Expiration      10/08/2020,2359 Performed at [facility], [address], [place], [place] [address]   Ammonia     Status: Abnormal   Collection Time: 10/05/20  4:57 PM  Result Value Ref Range   Ammonia 40 (H) 9 - 35 umol/L    Comment: Performed at [facility], [address], [place],  [address]  POC urine preg, ED  Status: None   Collection Time: 10/05/20  7:58 PM  Result Value Ref Range   Preg Test, Ur NEGATIVE NEGATIVE    Comment:        THE SENSITIVITY OF THIS METHODOLOGY IS >24 mIU/mL   Resp Panel by RT-PCR (Flu A&B, Covid) Nasopharyngeal Swab     Status: None   Collection Time: 10/05/20  9:14 PM   Specimen: Nasopharyngeal Swab; Nasopharyngeal(NP) swabs in vial transport medium  Result Value Ref Range   SARS Coronavirus 2 by RT PCR NEGATIVE NEGATIVE    Comment: (NOTE) SARS-CoV-2 target nucleic acids are NOT DETECTED.  The SARS-CoV-2 RNA is generally detectable in upper respiratory specimens during the acute phase of infection. The lowest concentration of SARS-CoV-2 viral copies this assay can detect is 138 copies/mL. A negative result does not preclude SARS-Cov-2 infection and should not be used as the sole basis for treatment or other patient management decisions. A negative result may occur with  improper specimen collection/handling, submission of specimen other than nasopharyngeal swab, presence of viral  mutation(s) within the areas targeted by this assay, and inadequate number of viral copies(<138 copies/mL). A negative result must be combined with clinical observations, patient history, and epidemiological information. The expected result is Negative.  Fact Sheet for Patients:  EntrepreneurPulse.com.au  Fact Sheet for Healthcare Providers:  IncredibleEmployment.be  This test is no t yet approved or cleared by the [place] FDA and  has been authorized for detection and/or diagnosis of SARS-CoV-2 by FDA under an Emergency Use Authorization (EUA). This EUA will remain  in effect (meaning this test can be used) for the duration of the COVID-19 declaration under Section 564(b)(1) of the Act, 21 U.S.C.section 360bbb-3(b)(1), unless the authorization is terminated  or revoked sooner.       Influenza A by PCR NEGATIVE NEGATIVE   Influenza B by PCR NEGATIVE NEGATIVE    Comment: (NOTE) The Xpert Xpress SARS-CoV-2/FLU/RSV plus assay is intended as an aid in the diagnosis of influenza from Nasopharyngeal swab specimens and should not be used as a sole basis for treatment. Nasal washings and aspirates are unacceptable for Xpert Xpress SARS-CoV-2/FLU/RSV testing.  Fact Sheet for Patients: EntrepreneurPulse.com.au  Fact Sheet for Healthcare Providers: IncredibleEmployment.be  This test is not yet approved or cleared by the [place] FDA and has been authorized for detection and/or diagnosis of SARS-CoV-2 by FDA under an Emergency Use Authorization (EUA). This EUA will remain in effect (meaning this test can be used) for the duration of the COVID-19 declaration under Section 564(b)(1) of the Act, 21 U.S.C. section 360bbb-3(b)(1), unless the authorization is terminated or revoked.  Performed at [facility] [address], [place], [place] [address]     Chemistries  Recent Labs  Lab 10/05/20 1648  NA  137  K 3.7  CL 100  CO2 25  GLUCOSE 108*  BUN <5*  CREATININE 0.40*  CALCIUM 8.6*  AST 714*  ALT 162*  ALKPHOS 359*  BILITOT 0.5   ------------------------------------------------------------------------------------------------------------------  ------------------------------------------------------------------------------------------------------------------ GFR: Estimated Creatinine Clearance: 74.7 mL/min (A) (by C-G formula based on SCr of 0.4 mg/dL (L)). Liver Function Tests: Recent Labs  Lab 10/05/20 1648  AST 714*  ALT 162*  ALKPHOS 359*  BILITOT 0.5  PROT 7.0  ALBUMIN 3.1*   No results for input(s): LIPASE, AMYLASE in the last 168 hours. Recent Labs  Lab 10/05/20 1657  AMMONIA 40*   Coagulation Profile: Recent Labs  Lab 10/05/20 1648  INR 1.0   Cardiac Enzymes: No results  for input(s): CKTOTAL, CKMB, CKMBINDEX, TROPONINI in the last 168 hours. BNP (last 3 results) No results for input(s): PROBNP in the last 8760 hours. HbA1C: No results for input(s): HGBA1C in the last 72 hours. CBG: No results for input(s): GLUCAP in the last 168 hours. Lipid Profile: No results for input(s): CHOL, HDL, LDLCALC, TRIG, CHOLHDL, LDLDIRECT in the last 72 hours. Thyroid Function Tests: No results for input(s): TSH, T4TOTAL, FREET4, T3FREE, THYROIDAB in the last 72 hours. Anemia Panel: No results for input(s): VITAMINB12, FOLATE, FERRITIN, TIBC, IRON, RETICCTPCT in the last 72 hours.  --------------------------------------------------------------------------------------------------------------- Urine analysis:    Component Value Date/Time   COLORURINE AMBER (A) 01/11/2020 0901   APPEARANCEUR HAZY (A) 01/11/2020 0901   LABSPEC 1.023 01/11/2020 0901   PHURINE 5.0 01/11/2020 0901   GLUCOSEU NEGATIVE 01/11/2020 0901   HGBUR NEGATIVE 01/11/2020 0901   BILIRUBINUR NEGATIVE 01/11/2020 0901   KETONESUR NEGATIVE 01/11/2020 0901   PROTEINUR 30 (A) 01/11/2020 0901    NITRITE NEGATIVE 01/11/2020 0901   LEUKOCYTESUR SMALL (A) 01/11/2020 0901      Imaging Results:    CT ABDOMEN PELVIS W CONTRAST  Result Date: 10/05/2020 CLINICAL DATA:  50 year old female with GI bleed. EXAM: CT ABDOMEN AND PELVIS WITH CONTRAST TECHNIQUE: Multidetector CT imaging of the abdomen and pelvis was performed using the standard protocol following bolus administration of intravenous contrast. CONTRAST:  177m OMNIPAQUE IOHEXOL 300 MG/ML  SOLN COMPARISON:  None FINDINGS: Lower chest: The visualized lung bases are clear. Borderline cardiomegaly. No intra-abdominal free air or free fluid. Hepatobiliary: Diffuse fatty liver. No intrahepatic biliary dilatation. The gallbladder is unremarkable. Pancreas: Unremarkable. No pancreatic ductal dilatation or surrounding inflammatory changes. Spleen: Normal in size without focal abnormality. Adrenals/Urinary Tract: The adrenal glands unremarkable there is no hydronephrosis on either side. There is symmetric enhancement and excretion of contrast by both kidneys. The visualized ureters appear unremarkable. The urinary bladder is partially distended and grossly unremarkable. Stomach/Bowel: There is a left lower quadrant loop colostomy. There is a large parastomal hernia containing multiple loops of small bowel without evidence of obstruction. There is diffuse inflammatory changes and thickening of the colon consistent with pancolitis. Loose stool noted within the colon consistent with diarrheal state. Correlation with clinical exam and stool cultures recommended. There is no bowel obstruction. The appendix is normal. Vascular/Lymphatic: The abdominal aorta and IVC are unremarkable. No portal venous gas. There is no adenopathy. Reproductive: The uterus is anteverted and grossly unremarkable. No adnexal masses. Other: Midline vertical anterior abdominal wall incisional scar. Musculoskeletal: Mild degenerative changes of the spine. Mild degenerative changes of the  hips. No acute osseous pathology. IMPRESSION: 1. Pancolitis and diarrheal state. Correlation with clinical exam and stool cultures recommended. No bowel obstruction. Normal appendix. 2. Left lower quadrant loop colostomy with large parastomal hernia without evidence of obstruction. 3. Fatty liver. Electronically Signed   By: AAnner CreteM.D.   On: 10/05/2020 20:41      Assessment & Plan:    Principal Problem:   Acute GI bleeding Active Problems:   Transaminitis   HTN (hypertension)   Mild protein malnutrition ([place]   1. Acute GI bleed 1. Started 11:00am on 10/05/20 2. Hgb drop from 15 (Nov 2021) to 12 3. Continue to trend hgb q 6 hours 4. protonix load in ED, continue 45mBID 5. Typed and screened in ED 6. Consult GI 7. NPO 8. Maintenance fluids 9. CT abdomen pelvis did not show an etiology of GI bleed, but did show diarrheal state recommended  correlation with possible stool cultures.  Stool cultures not done at this time as patient is not immune compromised and diarrheal state is likely cathartic secondary to GI bleed. 10. Continue to monitor 2. Transaminitis 1. July 2021 AST/ALT= 22/17; today = 714/162 2. Ammonia 40, Alk phs 359 3. Fatty liver on CT, follow up RUQ [place] 4. Hep panel pending 5. Covid test pending - but not likely with this much of an increase in transaminases 6.  3. HTN 1. Hold metoprolol, as BP is soft at 98/71 4. Macrocytic anemia 1. MCV 103.6 2. Continue folic acid and thiamine 5. Mild protein calorie malnutrition 1. Albumin 3.1 2. Patient reports poor PO intake over several months 3. Continue protein shakes when patient is cleared to take PO   DVT Prophylaxis-    SCDs   AM Labs Ordered, also please review Full Orders  Family Communication: No family at bedside  Code Status:  Full  Admission status: Observation     The patient's presenting symptoms include blood in ostomy bag The worrisome physical exam findings include dark red bloody  output The initial radiographic and laboratory data are worrisome because of hgb drop from 15 to 12. The chronic co-morbidities include diverting colostomy, fatty liver      Time spent in minutes : [place] DO

## 2020-10-06 ENCOUNTER — Observation Stay (HOSPITAL_COMMUNITY): Payer: Self-pay

## 2020-10-06 DIAGNOSIS — E441 Mild protein-calorie malnutrition: Secondary | ICD-10-CM

## 2020-10-06 DIAGNOSIS — K435 Parastomal hernia without obstruction or  gangrene: Secondary | ICD-10-CM

## 2020-10-06 DIAGNOSIS — I159 Secondary hypertension, unspecified: Secondary | ICD-10-CM

## 2020-10-06 DIAGNOSIS — K5100 Ulcerative (chronic) pancolitis without complications: Secondary | ICD-10-CM

## 2020-10-06 DIAGNOSIS — D62 Acute posthemorrhagic anemia: Secondary | ICD-10-CM | POA: Diagnosis present

## 2020-10-06 DIAGNOSIS — R748 Abnormal levels of other serum enzymes: Secondary | ICD-10-CM

## 2020-10-06 DIAGNOSIS — K433 Parastomal hernia with obstruction, without gangrene: Secondary | ICD-10-CM

## 2020-10-06 DIAGNOSIS — R7401 Elevation of levels of liver transaminase levels: Secondary | ICD-10-CM

## 2020-10-06 DIAGNOSIS — R109 Unspecified abdominal pain: Secondary | ICD-10-CM

## 2020-10-06 LAB — CBC
HCT: 28.5 % — ABNORMAL LOW (ref 36.0–46.0)
HCT: 30.6 % — ABNORMAL LOW (ref 36.0–46.0)
Hemoglobin: 10.1 g/dL — ABNORMAL LOW (ref 12.0–15.0)
Hemoglobin: 9.3 g/dL — ABNORMAL LOW (ref 12.0–15.0)
MCH: 34.1 pg — ABNORMAL HIGH (ref 26.0–34.0)
MCH: 34.1 pg — ABNORMAL HIGH (ref 26.0–34.0)
MCHC: 32.6 g/dL (ref 30.0–36.0)
MCHC: 33 g/dL (ref 30.0–36.0)
MCV: 103.4 fL — ABNORMAL HIGH (ref 80.0–100.0)
MCV: 104.4 fL — ABNORMAL HIGH (ref 80.0–100.0)
Platelets: 155 10*3/uL (ref 150–400)
Platelets: 166 10*3/uL (ref 150–400)
RBC: 2.73 MIL/uL — ABNORMAL LOW (ref 3.87–5.11)
RBC: 2.96 MIL/uL — ABNORMAL LOW (ref 3.87–5.11)
RDW: 12.4 % (ref 11.5–15.5)
RDW: 12.5 % (ref 11.5–15.5)
WBC: 3.9 10*3/uL — ABNORMAL LOW (ref 4.0–10.5)
WBC: 4.2 10*3/uL (ref 4.0–10.5)
nRBC: 0 % (ref 0.0–0.2)
nRBC: 0 % (ref 0.0–0.2)

## 2020-10-06 LAB — COMPREHENSIVE METABOLIC PANEL
ALT: 119 U/L — ABNORMAL HIGH (ref 0–44)
AST: 466 U/L — ABNORMAL HIGH (ref 15–41)
Albumin: 2.4 g/dL — ABNORMAL LOW (ref 3.5–5.0)
Alkaline Phosphatase: 270 U/L — ABNORMAL HIGH (ref 38–126)
Anion gap: 10 (ref 5–15)
BUN: <5 mg/dL — ABNORMAL LOW (ref 6–20)
CO2: 25 mmol/L (ref 22–32)
Calcium: 7.9 mg/dL — ABNORMAL LOW (ref 8.9–10.3)
Chloride: 104 mmol/L (ref 98–111)
Creatinine, Ser: 0.44 mg/dL (ref 0.44–1.00)
GFR, Estimated: >60 mL/min (ref >60)
Glucose, Bld: 87 mg/dL (ref 70–99)
Potassium: 3.9 mmol/L (ref 3.5–5.1)
Sodium: 139 mmol/L (ref 135–145)
Total Bilirubin: 0.9 mg/dL (ref 0.3–1.2)
Total Protein: 5.2 g/dL — ABNORMAL LOW (ref 6.5–8.1)

## 2020-10-06 LAB — HEMOGLOBIN AND HEMATOCRIT, BLOOD
HCT: 29.7 % — ABNORMAL LOW (ref 36.0–46.0)
Hemoglobin: 9.5 g/dL — ABNORMAL LOW (ref 12.0–15.0)

## 2020-10-06 LAB — HEPATITIS PANEL, ACUTE
HCV Ab: NONREACTIVE
Hep A IgM: NONREACTIVE
Hep B C IgM: NONREACTIVE
Hepatitis B Surface Ag: NONREACTIVE

## 2020-10-06 LAB — MAGNESIUM: Magnesium: 1.4 mg/dL — ABNORMAL LOW (ref 1.7–2.4)

## 2020-10-06 LAB — ETHANOL: Alcohol, Ethyl (B): <10 mg/dL (ref <10)

## 2020-10-06 MED ORDER — ONDANSETRON HCL 4 MG/2ML IJ SOLN
4.0000 mg | Freq: Four times a day (QID) | INTRAMUSCULAR | Status: DC | PRN
  Administered 2020-10-07: 4 mg via INTRAVENOUS
  Filled 2020-10-06: qty 2

## 2020-10-06 MED ORDER — ONDANSETRON HCL 4 MG PO TABS: 4.0000 mg | ORAL_TABLET | Freq: Four times a day (QID) | ORAL | Status: DC | PRN

## 2020-10-06 MED ORDER — HYDROXYZINE HCL 25 MG PO TABS
25.0000 mg | ORAL_TABLET | Freq: Three times a day (TID) | ORAL | Status: DC | PRN
  Administered 2020-10-06 – 2020-10-08 (×5): 25 mg via ORAL
  Filled 2020-10-06 (×5): qty 1

## 2020-10-06 MED ORDER — ACETAMINOPHEN 650 MG RE SUPP: 650.0000 mg | Freq: Four times a day (QID) | RECTAL | Status: DC | PRN

## 2020-10-06 MED ORDER — SODIUM CHLORIDE 0.9 % IV SOLN: INTRAVENOUS | Status: DC

## 2020-10-06 MED ORDER — OXYCODONE HCL 5 MG PO TABS
5.0000 mg | ORAL_TABLET | ORAL | Status: DC | PRN
  Administered 2020-10-06: 5 mg via ORAL
  Filled 2020-10-06: qty 1

## 2020-10-06 MED ORDER — MAGNESIUM SULFATE 2 GM/50ML IV SOLN
2.0000 g | Freq: Once | INTRAVENOUS | Status: AC
  Administered 2020-10-06: 2 g via INTRAVENOUS
  Filled 2020-10-06: qty 50

## 2020-10-06 MED ORDER — ACETAMINOPHEN 325 MG PO TABS
650.0000 mg | ORAL_TABLET | Freq: Four times a day (QID) | ORAL | Status: DC | PRN
  Administered 2020-10-06 – 2020-10-09 (×2): 650 mg via ORAL
  Filled 2020-10-06 (×2): qty 2

## 2020-10-06 MED ORDER — PEG 3350-KCL-NA BICARB-NACL 420 G PO SOLR
4000.0000 mL | Freq: Once | ORAL | Status: AC
  Administered 2020-10-06: 4000 mL via ORAL

## 2020-10-06 MED ORDER — FERROUS SULFATE 325 (65 FE) MG PO TABS
325.0000 mg | ORAL_TABLET | Freq: Two times a day (BID) | ORAL | Status: DC
  Administered 2020-10-06 – 2020-10-11 (×9): 325 mg via ORAL
  Filled 2020-10-06 (×9): qty 1

## 2020-10-06 MED ORDER — TRAZODONE HCL 50 MG PO TABS
100.0000 mg | ORAL_TABLET | Freq: Every evening | ORAL | Status: DC | PRN
  Administered 2020-10-06 – 2020-10-10 (×3): 100 mg via ORAL
  Filled 2020-10-06 (×5): qty 2

## 2020-10-06 MED ORDER — DIPHENHYDRAMINE HCL 25 MG PO CAPS
25.0000 mg | ORAL_CAPSULE | Freq: Four times a day (QID) | ORAL | Status: DC | PRN
  Administered 2020-10-06 – 2020-10-07 (×4): 25 mg via ORAL
  Filled 2020-10-06 (×4): qty 1

## 2020-10-06 MED ORDER — PANTOPRAZOLE SODIUM 40 MG IV SOLR
40.0000 mg | Freq: Two times a day (BID) | INTRAVENOUS | Status: DC
  Administered 2020-10-06 – 2020-10-11 (×11): 40 mg via INTRAVENOUS
  Filled 2020-10-06 (×10): qty 40

## 2020-10-06 MED ORDER — THIAMINE HCL 100 MG PO TABS
100.0000 mg | ORAL_TABLET | Freq: Every day | ORAL | Status: DC
  Administered 2020-10-06 – 2020-10-08 (×3): 100 mg via ORAL
  Filled 2020-10-06 (×5): qty 1

## 2020-10-06 MED ORDER — MELATONIN 3 MG PO TABS
3.0000 mg | ORAL_TABLET | Freq: Every day | ORAL | Status: DC
  Administered 2020-10-06 – 2020-10-08 (×2): 3 mg via ORAL
  Filled 2020-10-06 (×4): qty 1

## 2020-10-06 MED ORDER — CAMPHOR-MENTHOL 0.5-0.5 % EX LOTN
TOPICAL_LOTION | CUTANEOUS | Status: DC | PRN
  Filled 2020-10-06: qty 222

## 2020-10-06 MED ORDER — FOLIC ACID 1 MG PO TABS
1.0000 mg | ORAL_TABLET | Freq: Every day | ORAL | Status: DC
  Administered 2020-10-06 – 2020-10-08 (×3): 1 mg via ORAL
  Filled 2020-10-06 (×5): qty 1

## 2020-10-06 NOTE — Progress Notes (Signed)
PROGRESS NOTE    Alyssa Carey  JIR:[id number] DOB: Aug 01, 1970 DOA: 10/05/2020 PCP: [facility]   Brief Narrative:   Alyssa Carey  is a 50 y.o. female, with history of alcohol dependence, necrotizing fasciitis requiring colostomy, hypertension, and more presents to the ED with a chief complaint of blood in the ostomy bag. Patient was hospitalized in June 2021 for necrotizing fasciitis that required diverting colostomy.  Patient has been admitted for evaluation of acute GI bleed and is also noted to have transaminitis.  Assessment & Plan:   Principal Problem:   Acute GI bleeding Active Problems:   Transaminitis   HTN (hypertension)   Mild protein malnutrition (HCC)   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CC)   Elevated alkaline phosphatase level   Abdominal pain   Parastomal hernia without obstruction or gangrene   Acute blood loss anemia secondary to GI bleed -Continue to monitor CBC in a.m. and transfuse as needed for hemoglobin less than 7 -Appreciate GI evaluation with plans for potential endoscopy while inpatient -Continue PPI IV twice daily -Continue n.p.o. until further GI evaluation -IV normal saline ongoing -CT abdomen and pelvis with no acute findings  Transaminitis -May be related to hepatic steatosis noted on CT -Acute hepatitis panel pending -Right upper quadrant ultrasound pending  Hypertension -Currently with soft blood pressure readings -Continue to hold metoprolol  Chronic macrocytic anemia -Continue folic acid and thiamine  Mild protein calorie malnutrition -Continue protein shakes when able to take p.o.  DVT prophylaxis: SCDs Code Status: Full Family Communication: Patient will call Disposition Plan:  Status is: Observation  The patient will require care spanning > 2 midnights and should be moved to inpatient because: Ongoing diagnostic testing needed not appropriate for outpatient work up, IV treatments appropriate due to intensity of illness or  inability to take PO and Inpatient level of care appropriate due to severity of illness  Dispo: The patient is from: Home              Anticipated d/c is to: Home              Patient currently is not medically stable to d/c.   Difficult to place patient No  Consultants:   GI  Procedures:   See below  Antimicrobials:   None   Subjective: Patient seen and evaluated today with some mild weakness and dizziness noted with ambulation.  She denies any significant abdominal pain.  Objective: Vitals:   10/05/20 2100 10/06/20 0006 10/06/20 0400 10/06/20 0737  BP: 98/71 122/82 100/70 (!) 94/59  Pulse: 83 99 98 94  Resp: 16 18 18 16   Temp:  98.3 F (36.8 C) 98.5 F (36.9 C) 98.9 F (37.2 C)  TempSrc:  Oral Oral Oral  SpO2: 100% 100% 100% 99%  Weight:      Height:        Intake/Output Summary (Last 24 hours) at 10/06/2020 1116 Last data filed at 10/06/2020 0500 Gross per 24 hour  Intake 2014.96 ml  Output 200 ml  Net 1814.96 ml   Filed Weights   10/05/20 1409  Weight: 78.1 kg    Examination:  General exam: Appears calm and comfortable  Respiratory system: Clear to auscultation. Respiratory effort normal. Cardiovascular system: S1 & S2 heard, RRR.  Gastrointestinal system: Abdomen is soft, colostomy bag present with brown stool and minimal bloody output Central nervous system: Alert and awake Extremities: No edema Skin: No significant lesions noted Psychiatry: Flat affect.    Data Reviewed: I have personally reviewed following  labs and imaging studies  CBC: Recent Labs  Lab 10/05/20 1648 10/06/20 0025 10/06/20 0532  WBC 3.7* 3.9* 4.2  NEUTROABS 1.4*  --   --   HGB 12.2 10.1* 9.3*  HCT 37.0 30.6* 28.5*  MCV 103.6* 103.4* 104.4*  PLT 184 166 341   Basic Metabolic Panel: Recent Labs  Lab 10/05/20 1648 10/06/20 0532  NA 137 139  K 3.7 3.9  CL 100 104  CO2 25 25  GLUCOSE 108* 87  BUN <5* <5*  CREATININE 0.40* 0.44  CALCIUM 8.6* 7.9*  MG  --   1.4*   GFR: Estimated Creatinine Clearance: 74.7 mL/min (by C-G formula based on SCr of 0.44 mg/dL). Liver Function Tests: Recent Labs  Lab 10/05/20 1648 10/06/20 0532  AST 714* 466*  ALT 162* 119*  ALKPHOS 359* 270*  BILITOT 0.5 0.9  PROT 7.0 5.2*  ALBUMIN 3.1* 2.4*   No results for input(s): LIPASE, AMYLASE in the last 168 hours. Recent Labs  Lab 10/05/20 1657  AMMONIA 40*   Coagulation Profile: Recent Labs  Lab 10/05/20 1648  INR 1.0   Cardiac Enzymes: No results for input(s): CKTOTAL, CKMB, CKMBINDEX, TROPONINI in the last 168 hours. BNP (last 3 results) No results for input(s): PROBNP in the last 8760 hours. HbA1C: No results for input(s): HGBA1C in the last 72 hours. CBG: No results for input(s): GLUCAP in the last 168 hours. Lipid Profile: No results for input(s): CHOL, HDL, LDLCALC, TRIG, CHOLHDL, LDLDIRECT in the last 72 hours. Thyroid Function Tests: No results for input(s): TSH, T4TOTAL, FREET4, T3FREE, THYROIDAB in the last 72 hours. Anemia Panel: No results for input(s): VITAMINB12, FOLATE, FERRITIN, TIBC, IRON, RETICCTPCT in the last 72 hours. Sepsis Labs: No results for input(s): PROCALCITON, LATICACIDVEN in the last 168 hours.  Recent Results (from the past 240 hour(s))  Resp Panel by RT-PCR (Flu A&B, Covid) Nasopharyngeal Swab     Status: None   Collection Time: 10/05/20  9:14 PM   Specimen: Nasopharyngeal Swab; Nasopharyngeal(NP) swabs in vial transport medium  Result Value Ref Range Status   SARS Coronavirus 2 by RT PCR NEGATIVE NEGATIVE Final    Comment: (NOTE) SARS-CoV-2 target nucleic acids are NOT DETECTED.  The SARS-CoV-2 RNA is generally detectable in upper respiratory specimens during the acute phase of infection. The lowest concentration of SARS-CoV-2 viral copies this assay can detect is 138 copies/mL. A negative result does not preclude SARS-Cov-2 infection and should not be used as the sole basis for treatment or other patient  management decisions. A negative result may occur with  improper specimen collection/handling, submission of specimen other than nasopharyngeal swab, presence of viral mutation(s) within the areas targeted by this assay, and inadequate number of viral copies(<138 copies/mL). A negative result must be combined with clinical observations, patient history, and epidemiological information. The expected result is Negative.  Fact Sheet for Patients:  EntrepreneurPulse.com.au  Fact Sheet for Healthcare Providers:  IncredibleEmployment.be  This test is no t yet approved or cleared by the [place] FDA and  has been authorized for detection and/or diagnosis of SARS-CoV-2 by FDA under an Emergency Use Authorization (EUA). This EUA will remain  in effect (meaning this test can be used) for the duration of the COVID-19 declaration under Section 564(b)(1) of the Act, 21 U.S.C.section 360bbb-3(b)(1), unless the authorization is terminated  or revoked sooner.       Influenza A by PCR NEGATIVE NEGATIVE Final   Influenza B by PCR NEGATIVE NEGATIVE Final  Comment: (NOTE) The Xpert Xpress SARS-CoV-2/FLU/RSV plus assay is intended as an aid in the diagnosis of influenza from Nasopharyngeal swab specimens and should not be used as a sole basis for treatment. Nasal washings and aspirates are unacceptable for Xpert Xpress SARS-CoV-2/FLU/RSV testing.  Fact Sheet for Patients: EntrepreneurPulse.com.au  Fact Sheet for Healthcare Providers: IncredibleEmployment.be  This test is not yet approved or cleared by the [place] FDA and has been authorized for detection and/or diagnosis of SARS-CoV-2 by FDA under an Emergency Use Authorization (EUA). This EUA will remain in effect (meaning this test can be used) for the duration of the COVID-19 declaration under Section 564(b)(1) of the Act, 21 U.S.C. section 360bbb-3(b)(1),  unless the authorization is terminated or revoked.  Performed at [facility], [address] [place], [place] [address]          Radiology Studies: CT ABDOMEN PELVIS W CONTRAST  Result Date: 10/05/2020 CLINICAL DATA:  50 year old female with GI bleed. EXAM: CT ABDOMEN AND PELVIS WITH CONTRAST TECHNIQUE: Multidetector CT imaging of the abdomen and pelvis was performed using the standard protocol following bolus administration of intravenous contrast. CONTRAST:  146m OMNIPAQUE IOHEXOL 300 MG/ML  SOLN COMPARISON:  None FINDINGS: Lower chest: The visualized lung bases are clear. Borderline cardiomegaly. No intra-abdominal free air or free fluid. Hepatobiliary: Diffuse fatty liver. No intrahepatic biliary dilatation. The gallbladder is unremarkable. Pancreas: Unremarkable. No pancreatic ductal dilatation or surrounding inflammatory changes. Spleen: Normal in size without focal abnormality. Adrenals/Urinary Tract: The adrenal glands unremarkable there is no hydronephrosis on either side. There is symmetric enhancement and excretion of contrast by both kidneys. The visualized ureters appear unremarkable. The urinary bladder is partially distended and grossly unremarkable. Stomach/Bowel: There is a left lower quadrant loop colostomy. There is a large parastomal hernia containing multiple loops of small bowel without evidence of obstruction. There is diffuse inflammatory changes and thickening of the colon consistent with pancolitis. Loose stool noted within the colon consistent with diarrheal state. Correlation with clinical exam and stool cultures recommended. There is no bowel obstruction. The appendix is normal. Vascular/Lymphatic: The abdominal aorta and IVC are unremarkable. No portal venous gas. There is no adenopathy. Reproductive: The uterus is anteverted and grossly unremarkable. No adnexal masses. Other: Midline vertical anterior abdominal wall incisional scar. Musculoskeletal: Mild degenerative  changes of the spine. Mild degenerative changes of the hips. No acute osseous pathology. IMPRESSION: 1. Pancolitis and diarrheal state. Correlation with clinical exam and stool cultures recommended. No bowel obstruction. Normal appendix. 2. Left lower quadrant loop colostomy with large parastomal hernia without evidence of obstruction. 3. Fatty liver. Electronically Signed   By: AAnner CreteM.D.   On: 10/05/2020 20:41        Scheduled Meds: . ferrous sulfate  325 mg Oral BID WC  . folic acid  1 mg Oral Daily  . melatonin  3 mg Oral QHS  . pantoprazole (PROTONIX) IV  40 mg Intravenous Q12H  . thiamine  100 mg Oral Daily   Continuous Infusions: . sodium chloride 125 mL/hr at 10/06/20 0956  . magnesium sulfate bolus IVPB       LOS: 0 days    Time spent: 35 minutes    Charlee Squibb DDarleen Crocker DO [facility]  If 7PM-7AM, please contact night-coverage [email or web address] 10/06/2020, 11:16 AM

## 2020-10-06 NOTE — Plan of Care (Signed)

## 2020-10-06 NOTE — Progress Notes (Signed)
H&H 9.5 and 29.7 at 4:49 PM

## 2020-10-06 NOTE — Consult Note (Addendum)
Referring Provider: [facility] Primary Care Physician:  [facility] Primary Gastroenterologist:  Dr. Benson Norway  Date of Admission: 10/05/20 Date of Consultation: 10/06/20  Reason for Consultation:  GI bleed  HPI:  Alyssa Carey is a 50 y.o. female with a past medical history of alcohol dependence (though no alcohol in 2 years), episode of necrotizing fasciitis requiring colostomy, hypertension who presents to the ED complaining of blood in her ostomy bag.  She was hospitalized in June 2021 for necrotizing fasciitis that required diverting colostomy and the patient indicates no complications.  However, she was in the emergency department November 2021 for postoperative pain and bleeding about the site of her colostomy bag but none from the bowel.  The patient informed the hospitalist that around 11 AM she noticed her bag was full of "dark red blood and clots" and had to completely empty it 3 times.  Denies any similar bleeding in the past.  No abdominal pain.  Noted decreased appetite and associated weakness which she attributed to her poor appetite.  Noted 10 to 15 pound weight loss associated with this. Only eating 1 full meal a day but drinks Pedialyte and protein shakes to make up for it. Denies chest pain, dyspnea, dizziness.  No vomiting or fevers.  In the ED her blood pressure was somewhat soft as low as 98/71, otherwise vital signs stable.  No leukocytosis.  Decline in hemoglobin from 15.4 approximately 4 months ago down to 12.2 yesterday, 10.1 yesterday evening, 9.3 this morning.  Also noted acute transaminitis with AST/ALT at 714/162, alkaline phosphatase 359, total bilirubin 0.5 all on hospital admission.  This morning these have improved somewhat to AST/ALT of 466/119, alk phos of 270, bilirubin remains normal though slightly bumped at 0.9.  Acute hepatitis panel pending.  CT of the abdomen and pelvis yesterday noted pancolitis and diarrheal state and recommended  correlation with clinical exam and/or stool studies, no bowel obstruction, left lower quadrant loop colostomy with large parastomal hernia without evidence of obstruction, fatty liver.  No mention of CBD dilation or gastric findings.  Right upper quadrant ultrasound appears scheduled/planned for today.  Only endoscopic findings in our system include a flexible sigmoidoscopy 01/11/2020 which found a single solitary ulcer of the rectum.  Interestingly the patient was referred to [facility] by nephrology with request for colonoscopy, although they declined the referral.  Today she states she had a colostomy placed about 9 months ago.  They are considering reversing this, however she was told she has ever colonoscopy.  She has not had a colonoscopy in 12-15 or more years.  At her last colonoscopy she states it did not find anything significant that she is aware of.  She sees Dr. Marcello Moores at [facility] surgery is planning to do her reversal.  She has not seen GI locally.  She is not aware of Dr. Benson Norway, although she states that she was out of it during her last hospitalization and she does not remember who she saw.  She states the day of her presentation she had significant black blood with clots in her bag.  She emptied it 3 times in emergency department, again later in the day on the floor, and last night states she had to empty it again.  There was still some material left in the drainage canister which I inspected and appears to be black or very dark red with some clots.  She does admit ibuprofen as needed, although she has not been eating much lately  and states that she has been avoiding taking it for the past 2 weeks knowing she should not take it on empty stomach.  No other NSAIDs, aspirin powders.  She has never had bleeding like this before.  She confirms she has not had alcohol in the past 2 years.  She does have abdominal pain just inferior to her stoma as well as left upper quadrant.  Denies any  overt GERD symptoms.  No dysphagia.  No other overt GI complaints.  Past Medical History:  Diagnosis Date  . HTN (hypertension) 10/05/2020    Past Surgical History:  Procedure Laterality Date  . FLEXIBLE SIGMOIDOSCOPY N/A 01/11/2020   Procedure: FLEXIBLE SIGMOIDOSCOPY;  Surgeon: Carol Ada, MD;  Location: [facility];  Service: Gastroenterology;  Laterality: N/A;  . INCISION AND DRAINAGE PERIRECTAL ABSCESS N/A 12/25/2019   Procedure: IRRIGATION AND DEBRIDEMENT BUTTOCKS, LAP LOOP COLOSTOMY;  Surgeon: Greer Pickerel, MD;  Location: [facility];  Service: General;  Laterality: N/A;  . IRRIGATION AND DEBRIDEMENT ABSCESS N/A 12/23/2019   Procedure: EXCISION AND DEBRIDEMENT LEFT BUTTOCK AND PERINEUM;  Surgeon: Clovis Riley, MD;  Location: [facility];  Service: General;  Laterality: N/A;  . LAPAROSCOPIC LOOP COLOSTOMY N/A 12/25/2019   Procedure: LAPAROSCOPIC LOOP COLOSTOMY;  Surgeon: Greer Pickerel, MD;  Location: [facility];  Service: General;  Laterality: N/A;    Prior to Admission medications   Medication Sig Start Date End Date Taking? Authorizing Provider  acetaminophen (TYLENOL) 325 MG tablet Take 650 mg by mouth every 6 (six) hours as needed.   Yes [provider]  ibuprofen (ADVIL) 600 MG tablet Take 600 mg by mouth every 6 (six) hours as needed. 07/25/20  Yes [provider]  Amino Acids-Protein Hydrolys (FEEDING SUPPLEMENT, PRO-STAT SUGAR FREE 64,) LIQD Take 30 mLs by mouth 2 (two) times daily between meals. Patient not taking: Reported on 05/31/2020 02/03/20   Antonieta Pert, MD  ascorbic acid (VITAMIN C) 500 MG tablet Take 1 tablet (500 mg total) by mouth 2 (two) times daily. Patient not taking: Reported on 05/31/2020 02/03/20   Antonieta Pert, MD  collagenase (SANTYL) ointment Apply topically daily. Patient not taking: Reported on 05/31/2020 02/04/20   Antonieta Pert, MD  diphenhydramine-acetaminophen (TYLENOL PM) 25-500 MG TABS tablet Take 1 tablet by mouth at bedtime as needed  (pain). Patient not taking: Reported on 05/31/2020    [provider]  feeding supplement, ENSURE ENLIVE, (ENSURE ENLIVE) LIQD Take 237 mLs by mouth 2 (two) times daily between meals. Patient not taking: Reported on 05/31/2020 02/03/20   Antonieta Pert, MD  ferrous sulfate 27 MG TABS Take 11.1111 tablets (300 mg total) by mouth 2 (two) times daily with a meal. Patient not taking: Reported on 05/31/2020 02/03/20   Antonieta Pert, MD  folic acid (FOLVITE) 1 MG tablet Take 1 tablet (1 mg total) by mouth daily. Patient not taking: Reported on 05/31/2020 02/04/20   Antonieta Pert, MD  ibuprofen (ADVIL) 200 MG tablet Take 200 mg by mouth every 6 (six) hours as needed for moderate pain. Patient not taking: Reported on 05/31/2020    [provider]  melatonin 3 MG TABS tablet Take 1 tablet (3 mg total) by mouth at bedtime. Patient not taking: Reported on 05/31/2020 02/03/20   Antonieta Pert, MD  metoprolol tartrate (LOPRESSOR) 25 MG tablet Take 1 tablet (25 mg total) by mouth 2 (two) times daily. Patient not taking: Reported on 05/31/2020 02/03/20   Antonieta Pert, MD  mineral oil-hydrophilic petrolatum (AQUAPHOR) ointment Apply topically 3 (  three) times daily. Patient not taking: Reported on 05/31/2020 02/03/20   Antonieta Pert, MD  Multiple Vitamin (MULTIVITAMIN WITH MINERALS) TABS tablet Take 1 tablet by mouth daily. Patient not taking: Reported on 05/31/2020 02/04/20   Antonieta Pert, MD  nutrition supplement, JUVEN, (JUVEN) PACK Take 1 packet by mouth 2 (two) times daily between meals. Patient not taking: Reported on 05/31/2020 02/03/20   Antonieta Pert, MD  oxyCODONE (OXY IR/ROXICODONE) 5 MG immediate release tablet Take by mouth. Patient not taking: Reported on 10/05/2020 03/06/20   [provider]  pantoprazole (PROTONIX) 40 MG tablet Take 1 tablet (40 mg total) by mouth daily. Patient not taking: Reported on 05/31/2020 02/04/20   Antonieta Pert, MD  thiamine 100 MG tablet Take 1 tablet (100 mg total) by mouth  daily. Patient not taking: Reported on 05/31/2020 02/04/20   Antonieta Pert, MD  traZODone (DESYREL) 100 MG tablet Take 1 tablet (100 mg total) by mouth at bedtime as needed for sleep. Patient not taking: Reported on 05/31/2020 02/03/20   Antonieta Pert, MD    Current Facility-Administered Medications  Medication Dose Route Frequency Provider Last Rate Last Admin  . 0.9 %  sodium chloride infusion   Intravenous Continuous Zierle-Ghosh, Asia B, DO 125 mL/hr at 10/06/20 0029 New Bag at 10/06/20 0029  . acetaminophen (TYLENOL) tablet 650 mg  650 mg Oral Q6H PRN Zierle-Ghosh, Asia B, DO   650 mg at 10/06/20 0200   Or  . acetaminophen (TYLENOL) suppository 650 mg  650 mg Rectal Q6H PRN Zierle-Ghosh, Asia B, DO      . diphenhydrAMINE (BENADRYL) capsule 25 mg  25 mg Oral Q6H PRN Zierle-Ghosh, Asia B, DO   25 mg at 10/06/20 0639  . ferrous sulfate tablet 325 mg  325 mg Oral BID WC Zierle-Ghosh, Asia B, DO      . folic acid (FOLVITE) tablet 1 mg  1 mg Oral Daily Zierle-Ghosh, Asia B, DO      . melatonin tablet 3 mg  3 mg Oral QHS Zierle-Ghosh, Asia B, DO   3 mg at 10/06/20 0027  . ondansetron (ZOFRAN) tablet 4 mg  4 mg Oral Q6H PRN Zierle-Ghosh, Asia B, DO       Or  . ondansetron (ZOFRAN) injection 4 mg  4 mg Intravenous Q6H PRN Zierle-Ghosh, Asia B, DO      . oxyCODONE (Oxy IR/ROXICODONE) immediate release tablet 5 mg  5 mg Oral Q4H PRN Zierle-Ghosh, Asia B, DO   5 mg at 10/06/20 0028  . pantoprazole (PROTONIX) injection 40 mg  40 mg Intravenous Q12H Zierle-Ghosh, Asia B, DO      . thiamine tablet 100 mg  100 mg Oral Daily Zierle-Ghosh, Asia B, DO      . traZODone (DESYREL) tablet 100 mg  100 mg Oral QHS PRN Zierle-Ghosh, Asia B, DO        Allergies as of 10/05/2020 - Review Complete 10/05/2020  Allergen Reaction Noted  . Penicillins  12/21/2019    History reviewed. No pertinent family history.  Social History   Socioeconomic History  . Marital status: Single    Spouse name: Not on file  . Number  of children: 1  . Years of education: Not on file  . Highest education level: Not on file  Occupational History  . Occupation: unknown  Tobacco Use  . Smoking status: Never Smoker  . Smokeless tobacco: Never Used  Vaping Use  . Vaping Use: Never used  Substance and Sexual Activity  .  Alcohol use: Not Currently  . Drug use: Not Currently  . Sexual activity: Not Currently  Other Topics Concern  . Not on file  Social History Narrative   Patient found in hotel room; unsure of living situation prior.   Social Determinants of Health   Financial Resource Strain: Not on file  Food Insecurity: Not on file  Transportation Needs: Not on file  Physical Activity: Not on file  Stress: Not on file  Social Connections: Not on file  Intimate Partner Violence: Not on file    Review of Systems: General: Negative for anorexia, fever, chills. Admits fatigue, weakness. Admits recent weight loss with poor appetite. Eyes: Negative for vision changes.  ENT: Negative for hoarseness, difficulty swallowing. CV: Negative for chest pain, angina, palpitations, peripheral edema.  Respiratory: Negative for dyspnea at rest, cough, sputum, wheezing.  GI: See history of present illness. Derm: She had some itching that she thinks is from the pain medication, was given Benadryl which helped.  Endo: Negative for unusual weight change.  Heme: Negative for bruising or bleeding. Allergy: Negative for rash or hives.  Physical Exam: Vital signs in last 24 hours: Temp:  [97.9 F (36.6 C)-98.9 F (37.2 C)] 98.9 F (37.2 C) (03/23 0737) Pulse Rate:  [73-99] 94 (03/23 0737) Resp:  [16-34] 16 (03/23 0737) BP: (90-122)/(50-84) 94/59 (03/23 0737) SpO2:  [98 %-100 %] 99 % (03/23 0737) Weight:  [78.1 kg] 78.1 kg (03/22 1409)   General:   Alert,  Well-developed, well-nourished, pleasant and cooperative in NAD Head:  Normocephalic and atraumatic. Eyes:  Sclera clear, no icterus. Conjunctiva pink. Ears:  Normal  auditory acuity. Neck:  Supple; no masses or thyromegaly. Lungs:  Clear throughout to auscultation. No wheezes, crackles, or rhonchi. No acute distress. Heart:  Regular rate and rhythm; no murmurs, clicks, rubs,  or gallops. Abdomen:  Soft and nondistended. Some mild TTP inferior to the stoma and LUQ. No masses, hepatosplenomegaly or hernias noted. Normal bowel sounds, without guarding, and without rebound.   Rectal:  Deferred.   Msk:  Symmetrical without gross deformities. Pulses:  Normal bilateral DP pulses noted. Extremities:  Without clubbing or edema. Neurologic:  Alert and  oriented x4;  grossly normal neurologically. Psych:  Alert and cooperative. Normal mood and affect.  Intake/Output from previous day: 03/22 0701 - 03/23 0700 In: 2015 [P.O.:480; I.V.:402.7; IV Piggyback:1132.3] Out: 200 [Stool:200] Intake/Output this shift: No intake/output data recorded.  Lab Results: Recent Labs    10/05/20 1648 10/06/20 0025 10/06/20 0532  WBC 3.7* 3.9* 4.2  HGB 12.2 10.1* 9.3*  HCT 37.0 30.6* 28.5*  PLT 184 166 155   BMET Recent Labs    10/05/20 1648 10/06/20 0532  NA 137 139  K 3.7 3.9  CL 100 104  CO2 25 25  GLUCOSE 108* 87  BUN <5* <5*  CREATININE 0.40* 0.44  CALCIUM 8.6* 7.9*   LFT Recent Labs    10/05/20 1648 10/06/20 0532  PROT 7.0 5.2*  ALBUMIN 3.1* 2.4*  AST 714* 466*  ALT 162* 119*  ALKPHOS 359* 270*  BILITOT 0.5 0.9   PT/INR Recent Labs    10/05/20 1648  LABPROT 12.5  INR 1.0   Hepatitis Panel No results for input(s): HEPBSAG, HCVAB, HEPAIGM, HEPBIGM in the last 72 hours. C-Diff No results for input(s): CDIFFTOX in the last 72 hours.  Studies/Results: CT ABDOMEN PELVIS W CONTRAST  Result Date: 10/05/2020 CLINICAL DATA:  50 year old female with GI bleed. EXAM: CT ABDOMEN AND PELVIS WITH CONTRAST TECHNIQUE:  Multidetector CT imaging of the abdomen and pelvis was performed using the standard protocol following bolus administration of  intravenous contrast. CONTRAST:  158m OMNIPAQUE IOHEXOL 300 MG/ML  SOLN COMPARISON:  None FINDINGS: Lower chest: The visualized lung bases are clear. Borderline cardiomegaly. No intra-abdominal free air or free fluid. Hepatobiliary: Diffuse fatty liver. No intrahepatic biliary dilatation. The gallbladder is unremarkable. Pancreas: Unremarkable. No pancreatic ductal dilatation or surrounding inflammatory changes. Spleen: Normal in size without focal abnormality. Adrenals/Urinary Tract: The adrenal glands unremarkable there is no hydronephrosis on either side. There is symmetric enhancement and excretion of contrast by both kidneys. The visualized ureters appear unremarkable. The urinary bladder is partially distended and grossly unremarkable. Stomach/Bowel: There is a left lower quadrant loop colostomy. There is a large parastomal hernia containing multiple loops of small bowel without evidence of obstruction. There is diffuse inflammatory changes and thickening of the colon consistent with pancolitis. Loose stool noted within the colon consistent with diarrheal state. Correlation with clinical exam and stool cultures recommended. There is no bowel obstruction. The appendix is normal. Vascular/Lymphatic: The abdominal aorta and IVC are unremarkable. No portal venous gas. There is no adenopathy. Reproductive: The uterus is anteverted and grossly unremarkable. No adnexal masses. Other: Midline vertical anterior abdominal wall incisional scar. Musculoskeletal: Mild degenerative changes of the spine. Mild degenerative changes of the hips. No acute osseous pathology. IMPRESSION: 1. Pancolitis and diarrheal state. Correlation with clinical exam and stool cultures recommended. No bowel obstruction. Normal appendix. 2. Left lower quadrant loop colostomy with large parastomal hernia without evidence of obstruction. 3. Fatty liver. Electronically Signed   By: AAnner CreteM.D.   On: 10/05/2020 20:41     Impression: Very pleasant 50year old female with significant history for recent necrotizing fasciitis status post diverting colostomy approximately 9 months ago.  Also noted history of alcohol dependence with no alcohol in 2 years.  She presented with complaints of weakness, fatigue, 10 to 15 pound weight loss associated with poor appetite as described in HPI.  Her biggest complaint was large-volume output of black/dark red blood into her ostomy bag.  Has never had bleeding like this before.  CT with pancolitis and diarrheal state, large parastomal hernia, fatty liver.  Flexible sigmoidoscopy during her last hospitalization in 2021 with a single solitary ulcer of the rectum.  Has never had EGD before.  Acute GI bleed: Seems more reminiscent of upper GI bleed given the dark red/black output.  She is on NSAIDs, though she admittedly has not taken these recently in the past 2 weeks.  However, she could have developed NSAID related gastric injury that is just now declaring itself such as gastritis, gastric erosion, gastric ulcer, duodenitis, duodenal ulcer or erosion.  Also possible erosive reflux esophagitis, though less likely given no overt GERD symptoms. Small bowel etiology also remains in the differential.  Of note, the patient is not on any anticoagulants.  She is n.p.o. at this time.  Pancolitis: Possibly infectious, inflammatory bowel disease in the differentials.  Could simply be a noninfectious gastroenteritis.  She will need a colonoscopy eventually, whether or not this is inpatient versus outpatient will depend on her clinical progress.  Elevated LFTs: Significant transaminitis with AST/ALT at 714/162, alk phos of 359 on admission.  Previous LFTs were essentially normal.  CT imaging suggestive of fatty liver disease.  Has not had alcohol in 2 years.  Given her pattern, possible choledocholithiasis versus passed stone/sludge.  Her LFTs have improved significantly today and are at AST/ALT of  466/119, alk phos improved to 70.  Her INR yesterday was normal at 1.0.  While I was in the room her right upper quadrant ultrasound was being completed and the tech noted no significant CBD dilation.  Remainder of exam yet to be completed.  Acute hepatitis panel is pending.  Plan: 1. Consider possible EGD this admission (will discuss with Dr. Melony Overly) 2. Possible colonoscopy this admission versus outpatient 3. Follow-up for acute hepatitis panel results 4. Follow-up for full right upper quadrant ultrasound results 5. Trend liver labs 6. We will likely eventually need full serological work-up of her elevated LFTs 7. Follow hgb 8. Transfuse as necessary 9. Strict NSAID avoidance 10. Continue PPI 11. Supportive care 12. Further recommendations to follow   Thank you for allowing [place] to participate in the care of [place], DNP, AGNP-C Adult & Gerontological Nurse Practitioner [facility]   ADDENDUM: Discussed with Dr. Laural Golden. Will plan for EGD and colonoscopy with rectal evaluation tomorrow morning to allow evaluation of upper and lower GI tract.    LOS: 0 days     10/06/2020, 8:12 AM

## 2020-10-06 NOTE — TOC Transition Note (Addendum)
Transition of Care [facility]) - CM/SW Discharge Note   Patient Details  Name: Alyssa Carey MRN: [id number] Date of Birth: 04-09-1971  [facility] CM/SW Contact:  Natasha Bence, LCSW Phone Number: 10/06/2020, 12:19 PM   Clinical Narrative:    Patient iis a 50 year old female admitted for Acute GI bleeding. CSW observed patient's insurance status. CSW contacted patient to inquire if agreeable to referral for medicaid. Patient agreeable. CSW referred patient for medicaid to Development worker, community. TOC to follow.   Final next level of care: Home/Self Care Barriers to Discharge: Continued Medical Work up   Patient Goals and CMS Choice Patient states their goals for this hospitalization and ongoing recovery are:: Return home CMS Medicare.gov Compare Post Acute Care list provided to:: Patient Choice offered to / list presented to : Patient  Discharge Placement                       Discharge Plan and Services In-house Referral: Financial Counselor Discharge Planning Services: NA Post Acute Care Choice: NA          DME Arranged: N/A DME Agency: NA       HH Arranged: NA HH Agency: NA        Social Determinants of Health (SDOH) Interventions     Readmission Risk Interventions No flowsheet data found.

## 2020-10-07 ENCOUNTER — Inpatient Hospital Stay (HOSPITAL_COMMUNITY): Payer: Self-pay | Admitting: Certified Registered Nurse Anesthetist

## 2020-10-07 ENCOUNTER — Encounter (HOSPITAL_COMMUNITY)
Admission: EM | Disposition: A | Discharge status: Home / Self Care | Payer: Self-pay | Source: Home / Self Care | Attending: Internal Medicine

## 2020-10-07 ENCOUNTER — Encounter (HOSPITAL_COMMUNITY): Payer: Self-pay | Admitting: Internal Medicine

## 2020-10-07 HISTORY — PX: BIOPSY: SHX5522

## 2020-10-07 HISTORY — PX: ESOPHAGOGASTRODUODENOSCOPY (EGD) WITH PROPOFOL: SHX5813

## 2020-10-07 HISTORY — PX: COLONOSCOPY WITH PROPOFOL: SHX5780

## 2020-10-07 LAB — CBC WITH DIFFERENTIAL/PLATELET
Abs Immature Granulocytes: 0.01 10*3/uL (ref 0.00–0.07)
Basophils Absolute: 0 10*3/uL (ref 0.0–0.1)
Basophils Relative: 1 %
Eosinophils Absolute: 0.1 10*3/uL (ref 0.0–0.5)
Eosinophils Relative: 2 %
HCT: 28.6 % — ABNORMAL LOW (ref 36.0–46.0)
Hemoglobin: 9.7 g/dL — ABNORMAL LOW (ref 12.0–15.0)
Immature Granulocytes: 0 %
Lymphocytes Relative: 14 %
Lymphs Abs: 0.6 10*3/uL — ABNORMAL LOW (ref 0.7–4.0)
MCH: 35.5 pg — ABNORMAL HIGH (ref 26.0–34.0)
MCHC: 33.9 g/dL (ref 30.0–36.0)
MCV: 104.8 fL — ABNORMAL HIGH (ref 80.0–100.0)
Monocytes Absolute: 0.3 10*3/uL (ref 0.1–1.0)
Monocytes Relative: 8 %
Neutro Abs: 3.1 10*3/uL (ref 1.7–7.7)
Neutrophils Relative %: 75 %
Platelets: 189 10*3/uL (ref 150–400)
RBC: 2.73 MIL/uL — ABNORMAL LOW (ref 3.87–5.11)
RDW: 12.7 % (ref 11.5–15.5)
WBC: 4.1 10*3/uL (ref 4.0–10.5)
nRBC: 0 % (ref 0.0–0.2)

## 2020-10-07 LAB — PROTIME-INR
INR: 1.1 (ref 0.8–1.2)
Prothrombin Time: 13.4 seconds (ref 11.4–15.2)

## 2020-10-07 LAB — COMPREHENSIVE METABOLIC PANEL
ALT: 85 U/L — ABNORMAL HIGH (ref 0–44)
AST: 227 U/L — ABNORMAL HIGH (ref 15–41)
Albumin: 2.2 g/dL — ABNORMAL LOW (ref 3.5–5.0)
Alkaline Phosphatase: 216 U/L — ABNORMAL HIGH (ref 38–126)
Anion gap: 9 (ref 5–15)
BUN: <5 mg/dL — ABNORMAL LOW (ref 6–20)
CO2: 22 mmol/L (ref 22–32)
Calcium: 7.8 mg/dL — ABNORMAL LOW (ref 8.9–10.3)
Chloride: 103 mmol/L (ref 98–111)
Creatinine, Ser: 0.47 mg/dL (ref 0.44–1.00)
GFR, Estimated: >60 mL/min (ref >60)
Glucose, Bld: 83 mg/dL (ref 70–99)
Potassium: 3.6 mmol/L (ref 3.5–5.1)
Sodium: 134 mmol/L — ABNORMAL LOW (ref 135–145)
Total Bilirubin: 0.9 mg/dL (ref 0.3–1.2)
Total Protein: 4.6 g/dL — ABNORMAL LOW (ref 6.5–8.1)

## 2020-10-07 LAB — MAGNESIUM: Magnesium: 1.9 mg/dL (ref 1.7–2.4)

## 2020-10-07 SURGERY — ESOPHAGOGASTRODUODENOSCOPY (EGD) WITH PROPOFOL
Anesthesia: General

## 2020-10-07 MED ORDER — MIDAZOLAM HCL 2 MG/2ML IJ SOLN
INTRAMUSCULAR | Status: AC
  Filled 2020-10-07: qty 2

## 2020-10-07 MED ORDER — LIDOCAINE HCL (CARDIAC) PF 100 MG/5ML IV SOSY
PREFILLED_SYRINGE | INTRAVENOUS | Status: DC | PRN
  Administered 2020-10-07: 50 mg via INTRAVENOUS

## 2020-10-07 MED ORDER — STERILE WATER FOR IRRIGATION IR SOLN
Status: DC | PRN
  Administered 2020-10-07: 200 mL

## 2020-10-07 MED ORDER — MIDAZOLAM HCL 2 MG/2ML IJ SOLN
INTRAMUSCULAR | Status: DC | PRN
  Administered 2020-10-07: 2 mg via INTRAVENOUS

## 2020-10-07 MED ORDER — PROPOFOL 500 MG/50ML IV EMUL
INTRAVENOUS | Status: DC | PRN
  Administered 2020-10-07: 150 ug/kg/min via INTRAVENOUS

## 2020-10-07 MED ORDER — PHENYLEPHRINE 40 MCG/ML (10ML) SYRINGE FOR IV PUSH (FOR BLOOD PRESSURE SUPPORT)
PREFILLED_SYRINGE | INTRAVENOUS | Status: AC
  Filled 2020-10-07: qty 10

## 2020-10-07 MED ORDER — DIPHENHYDRAMINE HCL 25 MG PO CAPS
25.0000 mg | ORAL_CAPSULE | Freq: Four times a day (QID) | ORAL | Status: DC
  Administered 2020-10-07 – 2020-10-09 (×8): 25 mg via ORAL
  Filled 2020-10-07 (×8): qty 1

## 2020-10-07 MED ORDER — LACTATED RINGERS IV SOLN
INTRAVENOUS | Status: DC
  Administered 2020-10-07: 1000 mL via INTRAVENOUS

## 2020-10-07 MED ORDER — FAMOTIDINE 20 MG PO TABS
20.0000 mg | ORAL_TABLET | Freq: Two times a day (BID) | ORAL | Status: DC
  Administered 2020-10-07 – 2020-10-11 (×9): 20 mg via ORAL
  Filled 2020-10-07 (×10): qty 1

## 2020-10-07 MED ORDER — PROPOFOL 10 MG/ML IV BOLUS
INTRAVENOUS | Status: DC | PRN
  Administered 2020-10-07 (×2): 100 mg via INTRAVENOUS

## 2020-10-07 MED ORDER — PHENYLEPHRINE 40 MCG/ML (10ML) SYRINGE FOR IV PUSH (FOR BLOOD PRESSURE SUPPORT)
PREFILLED_SYRINGE | INTRAVENOUS | Status: DC | PRN
  Administered 2020-10-07 (×5): 80 ug via INTRAVENOUS

## 2020-10-07 MED ORDER — SODIUM CHLORIDE 0.9 % IV SOLN: INTRAVENOUS | Status: DC

## 2020-10-07 NOTE — Progress Notes (Signed)
   10/07/20 2043  Vitals  Temp 98.4 F (36.9 C)  Temp Source Oral  BP (!) 83/62  MAP (mmHg) 70  BP Location Right Arm  BP Method Automatic  Patient Position (if appropriate) Sitting  Pulse Rate (!) 104  Pulse Rate Source Monitor  Resp 16  MEWS COLOR  MEWS Score Color Yellow  Oxygen Therapy  SpO2 100 %  O2 Device Room Air  MEWS Score  MEWS Temp 0  MEWS Systolic 1  MEWS Pulse 1  MEWS RR 0  MEWS LOC 0  MEWS Score 2  Provider Notification  Provider Name/Title Josephine Cables, DO  Date Provider Notified 10/07/20  Time Provider Notified 2127  Notification Type  (secure chat)  Notification Reason Change in status  Provider response No new orders;Other (Comment) (continue to monitor, ensure no recurrent bleeding in ostomy bag.)  Date of Provider Response 10/07/20  Time of Provider Response 2148

## 2020-10-07 NOTE — Op Note (Signed)
[facility] Patient Name: Alyssa Carey Procedure Date: 10/07/2020 3:34 PM MRN: [id number] Date of Birth: Aug 06, 1970 Attending MD: Norvel Richards , MD CSN: [id number] Age: 50 Admit Type: Inpatient Procedure:                Upper GI endoscopy Indications:              Melena Providers:                Norvel Richards, MD, Lambert Mody, Nelma Rothman, Technician Referring MD:              Medicines:                Propofol per Anesthesia Complications:            No immediate complications. Estimated Blood Loss:     Estimated blood loss was minimal. Procedure:                Pre-Anesthesia Assessment:                           - Prior to the procedure, a History and Physical                            was performed, and patient medications and                            allergies were reviewed. The patient's tolerance of                            previous anesthesia was also reviewed. The risks                            and benefits of the procedure and the sedation                            options and risks were discussed with the patient.                            All questions were answered, and informed consent                            was obtained. Prior Anticoagulants: The patient has                            taken no previous anticoagulant or antiplatelet                            agents. ASA Grade Assessment: III - A patient with                            severe systemic disease. After reviewing the risks  and benefits, the patient was deemed in                            satisfactory condition to undergo the procedure.                           After obtaining informed consent, the endoscope was                            passed under direct vision. Throughout the                            procedure, the patient's blood pressure, pulse, and                            oxygen saturations were  monitored continuously. The                            [id number]-[id number] ([id number]) scope was introduced through the                            mouth, and advanced to the second part of duodenum.                            The upper GI endoscopy was accomplished without                            difficulty. The patient tolerated the procedure                            well. Scope In: 3:57:22 PM Scope Out: 4:03:10 PM Total Procedure Duration: 0 hours 5 minutes 48 seconds  Findings:      The examined esophagus was normal.      A small hiatal hernia was present.      One non-bleeding cratered gastric ulcer with no stigmata of bleeding was       found in the gastric antrum. The lesion was 3 mm in largest dimension.       There were a few small erosions in the antrum as well. No infiltrative       process observed. Patent pylorus.      The duodenal bulb, second portion of the duodenum and third portion of       the duodenum were normal. Impression:               - Normal esophagus.                           - Small hiatal hernia.                           - Non-bleeding gastric ulcer with no stigmata of                            bleeding. Surrounding erosions. Status post biopsy.                           -  Normal duodenal bulb, second portion of the                            duodenum and third portion of the duodenum.                           -Findings at EGD today would not likely explain her                            presentation. See colonoscopy report. Moderate Sedation:      Moderate (conscious) sedation was personally administered by an       anesthesia professional. The following parameters were monitored: oxygen       saturation, heart rate, blood pressure, respiratory rate, EKG, adequacy       of pulmonary ventilation, and response to care. Recommendation:           - Return patient to hospital ward for ongoing care. Procedure Code(s):        --- Professional ---                            [id number], Esophagogastroduodenoscopy, flexible,                            transoral; diagnostic, including collection of                            specimen(s) by brushing or washing, when performed                            (separate procedure) Diagnosis Code(s):        --- Professional ---                           K44.9, Diaphragmatic hernia without obstruction or                            gangrene                           K25.9, Gastric ulcer, unspecified as acute or                            chronic, without hemorrhage or perforation                           K92.1, Melena (includes Hematochezia) CPT copyright 2019 [employer]. All rights reserved. The codes documented in this report are preliminary and upon coder review may  be revised to meet current compliance requirements. Cristopher Estimable. Kelon Easom, MD Norvel Richards, MD 10/07/2020 4:10:05 PM This report has been signed electronically. Number of Addenda: 0

## 2020-10-07 NOTE — Progress Notes (Signed)
Patient was requesting her PRN Trazodone but because of low BP was advised by on call MD to not give.

## 2020-10-07 NOTE — Anesthesia Procedure Notes (Signed)
Date/Time: 10/07/2020 4:22 PM Performed by: Orlie Dakin, CRNA Pre-anesthesia Checklist: Patient identified, Emergency Drugs available, Suction available and Patient being monitored Patient Re-evaluated:Patient Re-evaluated prior to induction Oxygen Delivery Method: Non-rebreather mask Induction Type: IV induction Placement Confirmation: positive ETCO2

## 2020-10-07 NOTE — Anesthesia Preprocedure Evaluation (Signed)
Anesthesia Evaluation  Patient identified by MRN, date of birth, ID band Patient awake    Reviewed: Allergy & Precautions, H&P , NPO status , Patient's Chart, lab work & pertinent test results, reviewed documented beta blocker date and time   Airway Mallampati: II  TM Distance: >3 FB Neck ROM: full    Dental no notable dental hx.    Pulmonary neg pulmonary ROS, Patient abstained from smoking.,    Pulmonary exam normal breath sounds clear to auscultation       Cardiovascular Exercise Tolerance: Good hypertension, negative cardio ROS   Rhythm:regular Rate:Normal     Neuro/Psych negative neurological ROS  negative psych ROS   GI/Hepatic PUD, (+)     substance abuse  alcohol use,   Endo/Other  negative endocrine ROS  Renal/GU Renal disease  negative genitourinary   Musculoskeletal   Abdominal   Peds  Hematology  (+) Blood dyscrasia, anemia ,   Anesthesia Other Findings   Reproductive/Obstetrics negative OB ROS                             Anesthesia Physical Anesthesia Plan  ASA: II  Anesthesia Plan: General   Post-op Pain Management:    Induction:   PONV Risk Score and Plan: Propofol infusion  Airway Management Planned:   Additional Equipment:   Intra-op Plan:   Post-operative Plan:   Informed Consent: I have reviewed the patients History and Physical, chart, labs and discussed the procedure including the risks, benefits and alternatives for the proposed anesthesia with the patient or authorized representative who has indicated his/her understanding and acceptance.     Dental Advisory Given  Plan Discussed with: CRNA  Anesthesia Plan Comments:         Anesthesia Quick Evaluation

## 2020-10-07 NOTE — Progress Notes (Signed)
   10/07/20 2119  Vitals  BP 98/67  MAP (mmHg) 75  BP Location Left Arm  BP Method Automatic  Patient Position (if appropriate) Sitting  Pulse Rate (!) 110  Pulse Rate Source Monitor  MEWS COLOR  MEWS Score Color Yellow  Oxygen Therapy  SpO2 100 %  O2 Device Room Air  MEWS Score  MEWS Temp 0  MEWS Systolic 1  MEWS Pulse 1  MEWS RR 0  MEWS LOC 0  MEWS Score 2  Provider Notification  Provider Name/Title Josephine Cables, DO  Date Provider Notified 10/07/20  Time Provider Notified 2127  Notification Type  (secure chat)  Notification Reason Change in status  Provider response No new orders;Other (Comment) (continue to monitor, ensure no recurrent bleeding in ostomy bag.)  Date of Provider Response 10/07/20  Time of Provider Response [id number]

## 2020-10-07 NOTE — Anesthesia Postprocedure Evaluation (Signed)
Anesthesia Post Note  Patient: Alyssa Carey  Procedure(s) Performed: ESOPHAGOGASTRODUODENOSCOPY (EGD) WITH PROPOFOL (N/A ) COLONOSCOPY WITH PROPOFOL (N/A ) BIOPSY  Patient location during evaluation: PACU Anesthesia Type: General Level of consciousness: awake Pain management: pain level controlled Vital Signs Assessment: post-procedure vital signs reviewed and stable Respiratory status: spontaneous breathing and respiratory function stable Cardiovascular status: blood pressure returned to baseline and stable Postop Assessment: no headache and no apparent nausea or vomiting Anesthetic complications: no   No complications documented.   Last Vitals:  Vitals:   10/07/20 1645 10/07/20 1712  BP: 108/65 121/75  Pulse: 91 87  Resp: (!) 23 20  Temp:  36.6 C  SpO2: 100% 100%    Last Pain:  Vitals:   10/07/20 1712  TempSrc: Oral  PainSc: [place]

## 2020-10-07 NOTE — Op Note (Addendum)
[facility] Patient Name: Alyssa Carey Procedure Date: 10/07/2020 4:04 PM MRN: [id number] Date of Birth: [birth year] Attending MD: Norvel Richards , MD CSN: [id number] Age: 50 Admit Type: Inpatient Procedure:                Colonoscopy Indications:              Melena Providers:                Norvel Richards, MD, Lambert Mody, Nelma Rothman, Technician Referring MD:              Medicines:                Propofol per Anesthesia Complications:            No immediate complications. Estimated Blood Loss:     Estimated blood loss was minimal. Procedure:                Pre-Anesthesia Assessment:                           - Prior to the procedure, a History and Physical                            was performed, and patient medications and                            allergies were reviewed. The patient's tolerance of                            previous anesthesia was also reviewed. The risks                            and benefits of the procedure and the sedation                            options and risks were discussed with the patient.                            All questions were answered, and informed consent                            was obtained. Prior Anticoagulants: The patient has                            taken no previous anticoagulant or antiplatelet                            agents. ASA Grade Assessment: III - A patient with                            severe systemic disease. After reviewing the risks                            and  benefits, the patient was deemed in                            satisfactory condition to undergo the procedure.                           After obtaining informed consent, the colonoscope                            was passed under direct vision. Throughout the                            procedure, the patient's blood pressure, pulse, and                            oxygen saturations were monitored  continuously. The                            [id number] ([id number]) scope was introduced through                            the descending colostomy and advanced to the 10 cm                            into the ileum -proximally and all the way to anus                            distally. Scope In: 4:11:55 PM Scope Out: 4:32:42 PM Scope Withdrawal Time: 0 hours 9 minutes 10 seconds  Total Procedure Duration: 0 hours 20 minutes 47 seconds  Findings:      Abnormal bulge of the left lower quadrant diffusely with the ostomy at       the center. The distal aspect of the loop colostomy as well as the       proximal loop; patient had a 1.5 cm healing ulcer in the distal rectum       with surrounding mucosal friability; however, proximally back to the       colostomy the colonic mucosa appeared entirely normal. No evidence of       fistula. From the colostomy proximally, the colon appeared diffusely       abnormal with fibrotic appearing skip areas of ulceration all the way to       the cecum. The distal 10 cm of terminal ileum appeared normal. The       abnormal colon cephalad to the colostomy was biopsied in multiple       locations. Impression:               -Single rectal ulcer with surrounding inflammation                            as described; no evidence of fistula; otherwise                            descending loop of colon appeared normal.                           -  Markedly abnormal proximal colon as described.                            Status post biopsy. Normal-appearing terminal ileum.                           -Query recent ischemic event versus NSAID insult                            versus peristomal hernia playing a role here. I                            also wonder if bump in LFTs could not be related to                            an ischemic phenomenon. They are coming down nicely. Moderate Sedation:      Moderate (conscious) sedation was personally administered by an        anesthesia professional. The following parameters were monitored: oxygen       saturation, heart rate, blood pressure, respiratory rate, EKG, adequacy       of pulmonary ventilation, and response to care. Recommendation:           - Return patient to hospital ward for ongoing care.                           - Clear liquid diet.                           - Continue present medications.                           - Repeat colonoscopy date to be determined after                            pending pathology results are reviewed for                            surveillance based on pathology results. See EGD                            report.                           - Return to GI office (date not yet determined). Procedure Code(s):        --- Professional ---                           [phone], Colonoscopy through stoma; diagnostic,                            including collection of specimen(s) by brushing or                            washing, when performed (separate procedure) Diagnosis Code(s):        ---  Professional ---                           K92.1, Melena (includes Hematochezia) CPT copyright 2019 [employer]. All rights reserved. The codes documented in this report are preliminary and upon coder review may  be revised to meet current compliance requirements. Cristopher Estimable. Rivan Siordia, MD Norvel Richards, MD 10/07/2020 4:49:21 PM This report has been signed electronically. Number of Addenda: 0

## 2020-10-07 NOTE — Progress Notes (Signed)
PROGRESS NOTE    Alyssa Carey  IWO:[id number] DOB: [birth year] DOA: 10/05/2020 PCP: [facility]   Brief Narrative:   [name],with history of alcohol dependence, necrotizing fasciitis requiring colostomy, hypertension, and more presents to the ED with a chief complaint of blood in the ostomy bag. Patient was hospitalized in June 2021 for necrotizing fasciitis that required diverting colostomy.  Patient has been admitted for evaluation of acute GI bleed and is also noted to have transaminitis.  Assessment & Plan:   Principal Problem:   Acute GI bleeding Active Problems:   Transaminitis   HTN (hypertension)   Mild protein malnutrition (HCC)   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CC)   Elevated alkaline phosphatase level   Abdominal pain   Parastomal hernia without obstruction or gangrene   Acute blood loss anemia   Acute blood loss anemia secondary to GI bleed-stable -Continue to monitor CBC in a.m. and transfuse as needed for hemoglobin less than 7 -Appreciate GI evaluation with plans for upper and lower endoscopy today -Continue PPI IV twice daily -Continue n.p.o. until further GI evaluation -IV normal saline ongoing -CT abdomen and pelvis with no acute findings  Transaminitis-downtrending -May be related to hepatic steatosis noted on CT -Acute hepatitis panel negative -Right upper quadrant ultrasound with findings of hepatic steatosis  Pruritus -Likely secondary to recent narcotic use which has now been listed as an allergy -No significant rash noted -Scheduled Benadryl as well as start scheduled Pepcid -Atarax as needed  Hypertension -Currently with soft blood pressure readings -Continue to hold metoprolol  Chronic macrocytic anemia -Continue folic acid and thiamine  Mild protein calorie malnutrition -Continue protein shakes when able to take p.o.  DVT prophylaxis: SCDs Code Status: Full Family Communication: Patient will  call Disposition Plan:  Status is: Inpatient  Remains inpatient appropriate because:Ongoing diagnostic testing needed not appropriate for outpatient work up   Dispo: The patient is from: Home              Anticipated d/c is to: Home              Patient currently is not medically stable to d/c.   Difficult to place patient No   Consultants:   GI  Procedures:   See below  Antimicrobials:   None  Subjective: Patient seen and evaluated today with no new acute complaints or concerns. No acute concerns or events noted overnight.  She continues to have significant amounts of itching.  Objective: Vitals:   10/06/20 0737 10/06/20 1627 10/06/20 2124 10/07/20 0608  BP: (!) 94/59 98/65 106/79 91/64  Pulse: 94 98 88 98  Resp: 16 17 16 16   Temp: 98.9 F (37.2 C) 98.4 F (36.9 C) 98.2 F (36.8 C) 98.9 F (37.2 C)  TempSrc: Oral Oral Oral Oral  SpO2: 99% 99% 98% 100%  Weight:      Height:        Intake/Output Summary (Last 24 hours) at 10/07/2020 1449 Last data filed at 10/07/2020 1100 Gross per 24 hour  Intake 0 ml  Output 250 ml  Net -250 ml   Filed Weights   10/05/20 1409  Weight: 78.1 kg    Examination:  General exam: Appears calm and comfortable  Respiratory system: Clear to auscultation. Respiratory effort normal. Cardiovascular system: S1 & S2 heard, RRR.  Gastrointestinal system: Abdomen is soft, ostomy bag without bloody output Central nervous system: Alert and awake Extremities: No edema Skin: Pruritic with mild erythema to torso arms and legs Psychiatry: Flat affect.  Data Reviewed: I have personally reviewed following labs and imaging studies  CBC: Recent Labs  Lab 10/05/20 1648 10/06/20 0025 10/06/20 0532 10/06/20 1649 10/07/20 0505  WBC 3.7* 3.9* 4.2  --  4.1  NEUTROABS 1.4*  --   --   --  3.1  HGB 12.2 10.1* 9.3* 9.5* 9.7*  HCT 37.0 30.6* 28.5* 29.7* 28.6*  MCV 103.6* 103.4* 104.4*  --  104.8*  PLT 184 166 155  --  786   Basic  Metabolic Panel: Recent Labs  Lab 10/05/20 1648 10/06/20 0532 10/07/20 0505  NA 137 139 134*  K 3.7 3.9 3.6  CL 100 104 103  CO2 25 25 22   GLUCOSE 108* 87 83  BUN <5* <5* <5*  CREATININE 0.40* 0.44 0.47  CALCIUM 8.6* 7.9* 7.8*  MG  --  1.4* 1.9   GFR: Estimated Creatinine Clearance: 74.7 mL/min (by C-G formula based on SCr of 0.47 mg/dL). Liver Function Tests: Recent Labs  Lab 10/05/20 1648 10/06/20 0532 10/07/20 0505  AST 714* 466* 227*  ALT 162* 119* 85*  ALKPHOS 359* 270* 216*  BILITOT 0.5 0.9 0.9  PROT 7.0 5.2* 4.6*  ALBUMIN 3.1* 2.4* 2.2*   No results for input(s): LIPASE, AMYLASE in the last 168 hours. Recent Labs  Lab 10/05/20 1657  AMMONIA 40*   Coagulation Profile: Recent Labs  Lab 10/05/20 1648 10/07/20 0505  INR 1.0 1.1   Cardiac Enzymes: No results for input(s): CKTOTAL, CKMB, CKMBINDEX, TROPONINI in the last 168 hours. BNP (last 3 results) No results for input(s): PROBNP in the last 8760 hours. HbA1C: No results for input(s): HGBA1C in the last 72 hours. CBG: No results for input(s): GLUCAP in the last 168 hours. Lipid Profile: No results for input(s): CHOL, HDL, LDLCALC, TRIG, CHOLHDL, LDLDIRECT in the last 72 hours. Thyroid Function Tests: No results for input(s): TSH, T4TOTAL, FREET4, T3FREE, THYROIDAB in the last 72 hours. Anemia Panel: No results for input(s): VITAMINB12, FOLATE, FERRITIN, TIBC, IRON, RETICCTPCT in the last 72 hours. Sepsis Labs: No results for input(s): PROCALCITON, LATICACIDVEN in the last 168 hours.  Recent Results (from the past 240 hour(s))  Resp Panel by RT-PCR (Flu A&B, Covid) Nasopharyngeal Swab     Status: None   Collection Time: 10/05/20  9:14 PM   Specimen: Nasopharyngeal Swab; Nasopharyngeal(NP) swabs in vial transport medium  Result Value Ref Range Status   SARS Coronavirus 2 by RT PCR NEGATIVE NEGATIVE Final    Comment: (NOTE) SARS-CoV-2 target nucleic acids are NOT DETECTED.  The SARS-CoV-2 RNA is  generally detectable in upper respiratory specimens during the acute phase of infection. The lowest concentration of SARS-CoV-2 viral copies this assay can detect is 138 copies/mL. A negative result does not preclude SARS-Cov-2 infection and should not be used as the sole basis for treatment or other patient management decisions. A negative result may occur with  improper specimen collection/handling, submission of specimen other than nasopharyngeal swab, presence of viral mutation(s) within the areas targeted by this assay, and inadequate number of viral copies(<138 copies/mL). A negative result must be combined with clinical observations, patient history, and epidemiological information. The expected result is Negative.  Fact Sheet for Patients:  EntrepreneurPulse.com.au  Fact Sheet for Healthcare Providers:  IncredibleEmployment.be  This test is no t yet approved or cleared by the [place] FDA and  has been authorized for detection and/or diagnosis of SARS-CoV-2 by FDA under an Emergency Use Authorization (EUA). This EUA will remain  in effect (meaning this test  can be used) for the duration of the COVID-19 declaration under Section 564(b)(1) of the Act, 21 U.S.C.section 360bbb-3(b)(1), unless the authorization is terminated  or revoked sooner.       Influenza A by PCR NEGATIVE NEGATIVE Final   Influenza B by PCR NEGATIVE NEGATIVE Final    Comment: (NOTE) The Xpert Xpress SARS-CoV-2/FLU/RSV plus assay is intended as an aid in the diagnosis of influenza from Nasopharyngeal swab specimens and should not be used as a sole basis for treatment. Nasal washings and aspirates are unacceptable for Xpert Xpress SARS-CoV-2/FLU/RSV testing.  Fact Sheet for Patients: EntrepreneurPulse.com.au  Fact Sheet for Healthcare Providers: IncredibleEmployment.be  This test is not yet approved or cleared by the [place] FDA and has been authorized for detection and/or diagnosis of SARS-CoV-2 by FDA under an Emergency Use Authorization (EUA). This EUA will remain in effect (meaning this test can be used) for the duration of the COVID-19 declaration under Section 564(b)(1) of the Act, 21 U.S.C. section 360bbb-3(b)(1), unless the authorization is terminated or revoked.  Performed at [facility], [address], [place],  [address]          Radiology Studies: CT ABDOMEN PELVIS W CONTRAST  Result Date: 10/05/2020 CLINICAL DATA:  50 year old female with GI bleed. EXAM: CT ABDOMEN AND PELVIS WITH CONTRAST TECHNIQUE: Multidetector CT imaging of the abdomen and pelvis was performed using the standard protocol following bolus administration of intravenous contrast. CONTRAST:  161m OMNIPAQUE IOHEXOL 300 MG/ML  SOLN COMPARISON:  None FINDINGS: Lower chest: The visualized lung bases are clear. Borderline cardiomegaly. No intra-abdominal free air or free fluid. Hepatobiliary: Diffuse fatty liver. No intrahepatic biliary dilatation. The gallbladder is unremarkable. Pancreas: Unremarkable. No pancreatic ductal dilatation or surrounding inflammatory changes. Spleen: Normal in size without focal abnormality. Adrenals/Urinary Tract: The adrenal glands unremarkable there is no hydronephrosis on either side. There is symmetric enhancement and excretion of contrast by both kidneys. The visualized ureters appear unremarkable. The urinary bladder is partially distended and grossly unremarkable. Stomach/Bowel: There is a left lower quadrant loop colostomy. There is a large parastomal hernia containing multiple loops of small bowel without evidence of obstruction. There is diffuse inflammatory changes and thickening of the colon consistent with pancolitis. Loose stool noted within the colon consistent with diarrheal state. Correlation with clinical exam and stool cultures recommended. There is no bowel obstruction. The  appendix is normal. Vascular/Lymphatic: The abdominal aorta and IVC are unremarkable. No portal venous gas. There is no adenopathy. Reproductive: The uterus is anteverted and grossly unremarkable. No adnexal masses. Other: Midline vertical anterior abdominal wall incisional scar. Musculoskeletal: Mild degenerative changes of the spine. Mild degenerative changes of the hips. No acute osseous pathology. IMPRESSION: 1. Pancolitis and diarrheal state. Correlation with clinical exam and stool cultures recommended. No bowel obstruction. Normal appendix. 2. Left lower quadrant loop colostomy with large parastomal hernia without evidence of obstruction. 3. Fatty liver. Electronically Signed   By: AAnner CreteM.D.   On: 10/05/2020 20:41   UKoreaAbdomen Limited  Result Date: 10/06/2020 CLINICAL DATA:  Transaminitis EXAM: ULTRASOUND ABDOMEN LIMITED RIGHT UPPER QUADRANT COMPARISON:  Abdominal CT from yesterday FINDINGS: Gallbladder: No gallstones or wall thickening visualized. No sonographic Murphy sign noted by sonographer. Common bile duct: Diameter: 6 mm.  Where visualized, no filling defect. Liver: Echogenic liver. No focal mass is seen. Portal vein is patent on color Doppler imaging with normal direction of blood flow towards the liver. IMPRESSION: Hepatic steatosis. Electronically Signed   By: JAngelica Chessman  Watts M.D.   On: 10/06/2020 11:24        Scheduled Meds: . [MAR Hold] diphenhydrAMINE  25 mg Oral Q6H  . [MAR Hold] famotidine  20 mg Oral BID  . [MAR Hold] ferrous sulfate  325 mg Oral BID WC  . [MAR Hold] folic acid  1 mg Oral Daily  . [MAR Hold] melatonin  3 mg Oral QHS  . [MAR Hold] pantoprazole (PROTONIX) IV  40 mg Intravenous Q12H  . [MAR Hold] thiamine  100 mg Oral Daily   Continuous Infusions: . sodium chloride 75 mL/hr at 10/07/20 0217     LOS: 1 day    Time spent: 35 minutes    Pratik Darleen Crocker, DO [facility]  If 7PM-7AM, please contact  night-coverage [email or web address] 10/07/2020, 2:49 PM

## 2020-10-07 NOTE — Progress Notes (Signed)
Initial Nutrition Assessment  DOCUMENTATION CODES:   Obesity unspecified  INTERVENTION:  Regular diet   Snack qhs if desired from nursing nourishment room   NUTRITION DIAGNOSIS:   Inadequate oral intake related to altered GI function as evidenced by  (acute GI bleed).  GOAL:   Patient will meet greater than or equal to 90% of their needs  MONITOR:   Diet advancement,PO intake,Weight trends,Skin,Labs  REASON FOR ASSESSMENT:   Malnutrition Screening Tool    ASSESSMENT: Patient is an obese 50 yo female with history of necrotizing fasciitis necessitating colostomy, presents with complaint of acute GI bleed.   3/23 EGD with colonoscopy/biopsy.  Diet advanced fully now. She consumed 100% of clears for lunch. Patient not excited about hospital food and has asked her son to bring her something. Good appetite endorsed by patient. No evidenced of weight loss per chart review.  Patient weight hx: 02/01/20-64.5 kg 05/31/20- 76.7 kg 10/05/20- 78.1 kg    Medications reviewed and include: Pepcid, ferrous sulfate, folic acid, Protonix, thiamine.  Labs reviewed: Abnormal LFT- Albumin 2.3 (L). Hgb 9.0  NUTRITION - FOCUSED PHYSICAL EXAM: Unable to perform.   Diet Order:   Diet Order            Diet regular Room service appropriate? Yes; Fluid consistency: Thin  Diet effective now                 EDUCATION NEEDS: no current needs No education needs have been identified at this time Skin:  Skin Assessment: Reviewed RN Assessment  Last BM:  3/24  Height:   Ht Readings from Last 1 Encounters:  10/05/20 4' 9"  (1.448 m)    Weight:   Wt Readings from Last 1 Encounters:  10/05/20 78.1 kg    Ideal Body Weight:   45 kg  BMI:  Body mass index is 37.26 kg/m.  Estimated Nutritional Needs:   Kcal:  9476-5465  Protein:  90-105  Fluid:  >1500 ,l daily   Colman Cater MS,RD,CSG,LDN Pager: Shea Evans

## 2020-10-07 NOTE — Plan of Care (Signed)

## 2020-10-07 NOTE — Transfer of Care (Signed)
Immediate Anesthesia Transfer of Care Note  Patient: Alyssa Carey  Procedure(s) Performed: ESOPHAGOGASTRODUODENOSCOPY (EGD) WITH PROPOFOL (N/A ) COLONOSCOPY WITH PROPOFOL (N/A ) BIOPSY  Patient Location: PACU  Anesthesia Type:General  Level of Consciousness: awake and oriented  Airway & Oxygen Therapy: Patient Spontanous Breathing  Post-op Assessment: Report given to RN and Post -op Vital signs reviewed and stable  Post vital signs: Reviewed and stable  Last Vitals:  Vitals Value Taken Time  BP 121/96 10/07/20 1642  Temp    Pulse 91 10/07/20 1644  Resp 23 10/07/20 1644  SpO2 100 % 10/07/20 1644  Vitals shown include unvalidated device data.  Last Pain:  Vitals:   10/07/20 1554  TempSrc:   PainSc: 0-No pain      Patients Stated Pain Goal: 7 (09/73/53 2992)  Complications: No complications documented.

## 2020-10-08 ENCOUNTER — Encounter (HOSPITAL_COMMUNITY): Payer: Self-pay | Admitting: Internal Medicine

## 2020-10-08 DIAGNOSIS — K922 Gastrointestinal hemorrhage, unspecified: Secondary | ICD-10-CM

## 2020-10-08 LAB — COMPREHENSIVE METABOLIC PANEL
ALT: 78 U/L — ABNORMAL HIGH (ref 0–44)
AST: 184 U/L — ABNORMAL HIGH (ref 15–41)
Albumin: 2.3 g/dL — ABNORMAL LOW (ref 3.5–5.0)
Alkaline Phosphatase: 210 U/L — ABNORMAL HIGH (ref 38–126)
Anion gap: 10 (ref 5–15)
BUN: <5 mg/dL — ABNORMAL LOW (ref 6–20)
CO2: 20 mmol/L — ABNORMAL LOW (ref 22–32)
Calcium: 7.9 mg/dL — ABNORMAL LOW (ref 8.9–10.3)
Chloride: 105 mmol/L (ref 98–111)
Creatinine, Ser: 0.54 mg/dL (ref 0.44–1.00)
GFR, Estimated: >60 mL/min (ref >60)
Glucose, Bld: 72 mg/dL (ref 70–99)
Potassium: 3.6 mmol/L (ref 3.5–5.1)
Sodium: 135 mmol/L (ref 135–145)
Total Bilirubin: 1 mg/dL (ref 0.3–1.2)
Total Protein: 5.1 g/dL — ABNORMAL LOW (ref 6.5–8.1)

## 2020-10-08 LAB — C DIFFICILE (CDIFF) QUICK SCRN (NO PCR REFLEX)
C Diff antigen: NEGATIVE
C Diff interpretation: NOT DETECTED
C Diff toxin: NEGATIVE

## 2020-10-08 LAB — CBC
HCT: 27.6 % — ABNORMAL LOW (ref 36.0–46.0)
Hemoglobin: 9 g/dL — ABNORMAL LOW (ref 12.0–15.0)
MCH: 34.4 pg — ABNORMAL HIGH (ref 26.0–34.0)
MCHC: 32.6 g/dL (ref 30.0–36.0)
MCV: 105.3 fL — ABNORMAL HIGH (ref 80.0–100.0)
Platelets: 174 10*3/uL (ref 150–400)
RBC: 2.62 MIL/uL — ABNORMAL LOW (ref 3.87–5.11)
RDW: 12.6 % (ref 11.5–15.5)
WBC: 4 10*3/uL (ref 4.0–10.5)
nRBC: 0 % (ref 0.0–0.2)

## 2020-10-08 LAB — MAGNESIUM: Magnesium: 1.8 mg/dL (ref 1.7–2.4)

## 2020-10-08 LAB — AMMONIA: Ammonia: 25 umol/L (ref 9–35)

## 2020-10-08 MED ORDER — HYDROCERIN EX CREA
TOPICAL_CREAM | Freq: Three times a day (TID) | CUTANEOUS | Status: DC
  Administered 2020-10-08: 1 via TOPICAL
  Filled 2020-10-08: qty 113

## 2020-10-08 MED ORDER — HALOPERIDOL LACTATE 5 MG/ML IJ SOLN
2.0000 mg | Freq: Four times a day (QID) | INTRAMUSCULAR | Status: DC | PRN
  Administered 2020-10-08 – 2020-10-09 (×2): 2 mg via INTRAVENOUS
  Filled 2020-10-08 (×2): qty 1

## 2020-10-08 NOTE — Progress Notes (Signed)
PROGRESS NOTE    Alyssa Carey  QMG:[id number] DOB: Jun 23, 1971 DOA: 10/05/2020 PCP: [facility]   Brief Narrative:   [name] a49 y.o.female,with history of alcohol dependence, necrotizing fasciitis requiring colostomy, hypertension, and more presents to the ED with a chief complaint of blood in the ostomy bag. Patient was hospitalized in June 2021 for necrotizing fasciitis that required diverting colostomy.Patient has been admitted for evaluation of acute GI bleed and is also noted to have transaminitis.  She has undergone EGD and colonoscopy on 3/24.  She continues to require close monitoring and dietary advancement.  Assessment & Plan:   Principal Problem:   Acute GI bleeding Active Problems:   Transaminitis   HTN (hypertension)   Mild protein malnutrition (HCC)   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CC)   Elevated alkaline phosphatase level   Abdominal pain   Parastomal hernia without obstruction or gangrene   Acute blood loss anemia   Acute blood loss anemia secondary to GI bleed-stable -EGD/colonoscopy on 3/24 with nonbleeding gastric ulcer and no stigmata of recent bleed along with colonoscopy with rectal ulcer and surrounding mucosal friability and possible signs of ischemia-biopsies pending -Continue to monitor CBC in a.m. and transfuse as needed for hemoglobin less than 7 -Continue PPI IV twice daily -Advance diet today per GI -CT abdomen and pelvis with no acute findings  Transaminitis-downtrending -May be related to hepatic steatosis noted on CT -Acute hepatitis panel negative -Right upper quadrant ultrasound with findings of hepatic steatosis  Pruritus-plan: -Likely secondary to recent narcotic use which has now been listed as an allergy -No significant rash noted -Scheduled Benadryl as well as Pepcid -Atarax as needed -Eucerin cream to help with skin hydration  Hypertension -Currently with soft blood pressure readings -Continue to hold  metoprolol  Chronic macrocytic anemia -Continue folic acid and thiamine  Mild protein calorie malnutrition -Continue protein shakes today  DVT prophylaxis:SCDs Code Status:Full Family Communication:Patient willcall Disposition Plan: Status is: Inpatient  Remains inpatient appropriate because:Ongoing diagnostic testing needed not appropriate for outpatient work up   Dispo: The patient is from: Home  Anticipated d/c is to: Home  Patient currently is not medically stable to d/c.              Difficult to place patient No   Consultants:  GI  Procedures:  See below  Antimicrobials:  None  Subjective: Patient seen and evaluated today with no new acute complaints or concerns. No acute concerns or events noted overnight.  She denies any further bleeding today.  She is hungry and would like to eat.  Objective: Vitals:   10/07/20 2322 10/08/20 0128 10/08/20 0435 10/08/20 1006  BP: 101/67 110/86 126/90 96/61  Pulse: 81 81 77 95  Resp:  16 20 16   Temp: 98.2 F (36.8 C) 99.2 F (37.3 C) 98.6 F (37 C) 97.9 F (36.6 C)  TempSrc: Oral Oral Oral Oral  SpO2: 100% 100% 100% 100%  Weight:      Height:        Intake/Output Summary (Last 24 hours) at 10/08/2020 1053 Last data filed at 10/08/2020 0311 Gross per 24 hour  Intake 2735 ml  Output 850 ml  Net 1885 ml   Filed Weights   10/05/20 1409  Weight: 78.1 kg    Examination:  General exam: Appears calm and comfortable  Respiratory system: Clear to auscultation. Respiratory effort normal. Cardiovascular system: S1 & S2 heard, RRR.  Gastrointestinal system: Abdomen is soft, ostomy bag with no bloody output. Central nervous system: Alert and  awake Extremities: No edema Skin: No significant lesions noted, generalized itchy, no significant rashes Psychiatry: Flat affect.    Data Reviewed: I have personally reviewed following labs and imaging studies  CBC: Recent Labs   Lab 10/05/20 1648 10/06/20 0025 10/06/20 0532 10/06/20 1649 10/07/20 0505 10/08/20 0420  WBC 3.7* 3.9* 4.2  --  4.1 4.0  NEUTROABS 1.4*  --   --   --  3.1  --   HGB 12.2 10.1* 9.3* 9.5* 9.7* 9.0*  HCT 37.0 30.6* 28.5* 29.7* 28.6* 27.6*  MCV 103.6* 103.4* 104.4*  --  104.8* 105.3*  PLT 184 166 155  --  189 416   Basic Metabolic Panel: Recent Labs  Lab 10/05/20 1648 10/06/20 0532 10/07/20 0505 10/08/20 0420  NA 137 139 134* 135  K 3.7 3.9 3.6 3.6  CL 100 104 103 105  CO2 25 25 22  20*  GLUCOSE 108* 87 83 72  BUN <5* <5* <5* <5*  CREATININE 0.40* 0.44 0.47 0.54  CALCIUM 8.6* 7.9* 7.8* 7.9*  MG  --  1.4* 1.9 1.8   GFR: Estimated Creatinine Clearance: 74.7 mL/min (by C-G formula based on SCr of 0.54 mg/dL). Liver Function Tests: Recent Labs  Lab 10/05/20 1648 10/06/20 0532 10/07/20 0505 10/08/20 0420  AST 714* 466* 227* 184*  ALT 162* 119* 85* 78*  ALKPHOS 359* 270* 216* 210*  BILITOT 0.5 0.9 0.9 1.0  PROT 7.0 5.2* 4.6* 5.1*  ALBUMIN 3.1* 2.4* 2.2* 2.3*   No results for input(s): LIPASE, AMYLASE in the last 168 hours. Recent Labs  Lab 10/05/20 1657  AMMONIA 40*   Coagulation Profile: Recent Labs  Lab 10/05/20 1648 10/07/20 0505  INR 1.0 1.1   Cardiac Enzymes: No results for input(s): CKTOTAL, CKMB, CKMBINDEX, TROPONINI in the last 168 hours. BNP (last 3 results) No results for input(s): PROBNP in the last 8760 hours. HbA1C: No results for input(s): HGBA1C in the last 72 hours. CBG: No results for input(s): GLUCAP in the last 168 hours. Lipid Profile: No results for input(s): CHOL, HDL, LDLCALC, TRIG, CHOLHDL, LDLDIRECT in the last 72 hours. Thyroid Function Tests: No results for input(s): TSH, T4TOTAL, FREET4, T3FREE, THYROIDAB in the last 72 hours. Anemia Panel: No results for input(s): VITAMINB12, FOLATE, FERRITIN, TIBC, IRON, RETICCTPCT in the last 72 hours. Sepsis Labs: No results for input(s): PROCALCITON, LATICACIDVEN in the last 168  hours.  Recent Results (from the past 240 hour(s))  Resp Panel by RT-PCR (Flu A&B, Covid) Nasopharyngeal Swab     Status: None   Collection Time: 10/05/20  9:14 PM   Specimen: Nasopharyngeal Swab; Nasopharyngeal(NP) swabs in vial transport medium  Result Value Ref Range Status   SARS Coronavirus 2 by RT PCR NEGATIVE NEGATIVE Final    Comment: (NOTE) SARS-CoV-2 target nucleic acids are NOT DETECTED.  The SARS-CoV-2 RNA is generally detectable in upper respiratory specimens during the acute phase of infection. The lowest concentration of SARS-CoV-2 viral copies this assay can detect is 138 copies/mL. A negative result does not preclude SARS-Cov-2 infection and should not be used as the sole basis for treatment or other patient management decisions. A negative result may occur with  improper specimen collection/handling, submission of specimen other than nasopharyngeal swab, presence of viral mutation(s) within the areas targeted by this assay, and inadequate number of viral copies(<138 copies/mL). A negative result must be combined with clinical observations, patient history, and epidemiological information. The expected result is Negative.  Fact Sheet for Patients:  EntrepreneurPulse.com.au  Fact Sheet  for Healthcare Providers:  IncredibleEmployment.be  This test is no t yet approved or cleared by the [place] and  has been authorized for detection and/or diagnosis of SARS-CoV-2 by FDA under an Emergency Use Authorization (EUA). This EUA will remain  in effect (meaning this test can be used) for the duration of the COVID-19 declaration under Section 564(b)(1) of the Act, 21 U.S.C.section 360bbb-3(b)(1), unless the authorization is terminated  or revoked sooner.       Influenza A by PCR NEGATIVE NEGATIVE Final   Influenza B by PCR NEGATIVE NEGATIVE Final    Comment: (NOTE) The Xpert Xpress SARS-CoV-2/FLU/RSV plus assay is intended  as an aid in the diagnosis of influenza from Nasopharyngeal swab specimens and should not be used as a sole basis for treatment. Nasal washings and aspirates are unacceptable for Xpert Xpress SARS-CoV-2/FLU/RSV testing.  Fact Sheet for Patients: EntrepreneurPulse.com.au  Fact Sheet for Healthcare Providers: IncredibleEmployment.be  This test is not yet approved or cleared by the [place] FDA and has been authorized for detection and/or diagnosis of SARS-CoV-2 by FDA under an Emergency Use Authorization (EUA). This EUA will remain in effect (meaning this test can be used) for the duration of the COVID-19 declaration under Section 564(b)(1) of the Act, 21 U.S.C. section 360bbb-3(b)(1), unless the authorization is terminated or revoked.  Performed at [facility], [address], [place], [place] [address]          Radiology Studies: US Abdomen Limited  Result Date: 10/06/2020 CLINICAL DATA:  Transaminitis EXAM: ULTRASOUND ABDOMEN LIMITED RIGHT UPPER QUADRANT COMPARISON:  Abdominal CT from yesterday FINDINGS: Gallbladder: No gallstones or wall thickening visualized. No sonographic Murphy sign noted by sonographer. Common bile duct: Diameter: 6 mm.  Where visualized, no filling defect. Liver: Echogenic liver. No focal mass is seen. Portal vein is patent on color Doppler imaging with normal direction of blood flow towards the liver. IMPRESSION: Hepatic steatosis. Electronically Signed   By: Monte Fantasia M.D.   On: 10/06/2020 11:24        Scheduled Meds: . diphenhydrAMINE  25 mg Oral Q6H  . famotidine  20 mg Oral BID  . ferrous sulfate  325 mg Oral BID WC  . folic acid  1 mg Oral Daily  . hydrocerin   Topical TID  . melatonin  3 mg Oral QHS  . pantoprazole (PROTONIX) IV  40 mg Intravenous Q12H  . thiamine  100 mg Oral Daily    LOS: 2 days    Time spent: 35 minutes    Abbagale Goguen Darleen Crocker, DO [facility]  If 7PM-7AM, please  contact night-coverage [email or web address] 10/08/2020, 10:53 AM

## 2020-10-08 NOTE — Plan of Care (Signed)

## 2020-10-08 NOTE — Progress Notes (Signed)
Pt found to be resting in bed, playing a virtual card game.  Notified patient that I was there to give her some medications. Pt said, "OK."  Medication prepared, attempted to scan pt armband after explaining to patient that I need to scan her armband.  Pt suddenly said, "get the fuck away from me" (while she threw her arm up to block from scanning her armband). "I know yall been playin all day." then she mumbled some other things. Pt remained fairly calm after the initial outburst but began to speak to different people, calling them by name and having an aggitated conversation with them.  Checked to make sure pt was not on phone. Cell phone was off, no earbuds in ear.  MD notified

## 2020-10-08 NOTE — Progress Notes (Signed)
Pt came to nurses station, accusing [place] of taking her mommas keys. Son called, reports that she acted like this "last time" because her ammonia level was high and she had a blood stream infection.

## 2020-10-08 NOTE — Progress Notes (Signed)
Subjective: Feels good today. No further melena/blood output into ostomy bag. Denies abdominal pain, N/V. Tolerating clear liquids, wants to eat real food. No other GI complaints.  Procedure results reviewed.  Objective: Vital signs in last 24 hours: Temp:  [97.9 F (36.6 C)-99.2 F (37.3 C)] 98.6 F (37 C) (03/25 0435) Pulse Rate:  [77-110] 77 (03/25 0435) Resp:  [16-27] 20 (03/25 0435) BP: (83-126)/(62-96) 126/90 (03/25 0435) SpO2:  [100 %] 100 % (03/25 0435) Last BM Date: 10/07/20 General:   Alert and oriented, pleasant Head:  Normocephalic and atraumatic. Eyes:  No icterus, sclera clear. Conjuctiva pink.  Heart:  S1, S2 present, no murmurs noted.  Lungs: Clear to auscultation bilaterally, without wheezing, rales, or rhonchi.  Abdomen:  Bowel sounds present, soft, non-tender, non-distended. No HSM or hernias noted. No rebound or guarding. No masses appreciated. Left mid-abdominal ostomy bag in place with scant amount of brown/green liquid output. Msk:  Symmetrical without gross deformities Extremities:  Without clubbing or edema. Neurologic:  Alert and  oriented x4;  grossly normal neurologically. Psych:  Alert and cooperative. Normal mood and affect.  Intake/Output from previous day: 03/24 0701 - 03/25 0700 In: 2735 [P.O.:320; I.V.:2415] Out: 850 [Urine:850] Intake/Output this shift: No intake/output data recorded.  Lab Results: Recent Labs    10/06/20 0532 10/06/20 1649 10/07/20 0505 10/08/20 0420  WBC 4.2  --  4.1 4.0  HGB 9.3* 9.5* 9.7* 9.0*  HCT 28.5* 29.7* 28.6* 27.6*  PLT 155  --  189 174   BMET Recent Labs    10/06/20 0532 10/07/20 0505 10/08/20 0420  NA 139 134* 135  K 3.9 3.6 3.6  CL 104 103 105  CO2 25 22 20*  GLUCOSE 87 83 72  BUN <5* <5* <5*  CREATININE 0.44 0.47 0.54  CALCIUM 7.9* 7.8* 7.9*   LFT Recent Labs    10/06/20 0532 10/07/20 0505 10/08/20 0420  PROT 5.2* 4.6* 5.1*  ALBUMIN 2.4* 2.2* 2.3*  AST 466* 227* 184*  ALT  119* 85* 78*  ALKPHOS 270* 216* 210*  BILITOT 0.9 0.9 1.0   PT/INR Recent Labs    10/05/20 1648 10/07/20 0505  LABPROT 12.5 13.4  INR 1.0 1.1   Hepatitis Panel Recent Labs    10/06/20 0025  HEPBSAG NON REACTIVE  HCVAB NON REACTIVE  HEPAIGM NON REACTIVE  HEPBIGM NON REACTIVE     Studies/Results: US Abdomen Limited  Result Date: 10/06/2020 CLINICAL DATA:  Transaminitis EXAM: ULTRASOUND ABDOMEN LIMITED RIGHT UPPER QUADRANT COMPARISON:  Abdominal CT from yesterday FINDINGS: Gallbladder: No gallstones or wall thickening visualized. No sonographic Murphy sign noted by sonographer. Common bile duct: Diameter: 6 mm.  Where visualized, no filling defect. Liver: Echogenic liver. No focal mass is seen. Portal vein is patent on color Doppler imaging with normal direction of blood flow towards the liver. IMPRESSION: Hepatic steatosis. Electronically Signed   By: Monte Fantasia M.D.   On: 10/06/2020 11:24    Assessment: Very pleasant 50 year old female with significant history for recent necrotizing fasciitis status post diverting colostomy approximately 9 months ago.  Also noted history of alcohol dependence with no alcohol in 2 years.  She presented with complaints of weakness, fatigue, 10 to 15 pound weight loss associated with poor appetite as described in HPI.  Her biggest complaint was large-volume output of black/dark red blood into her ostomy bag.  CT with pancolitis and diarrheal state, large parastomal hernia, fatty liver.  Flexible sigmoidoscopy during her last hospitalization in 2021 with a  single solitary ulcer of the rectum.  Has never had EGD before.  Acute GI bleed: Seems more reminiscent of upper GI bleed given the dark red/black output.  She was on NSAIDs, though she admittedly has not taken these recently in the past 2 weeks.  Queried possible NSAID related injury. Of note, the patient is not on any anticoagulants.  EGD and colonoscopy through ostomy/proctoscopy completed  yesterday. EGD only found nonbleeding gastric ulcer with no stigmata of bleeding and surrounding erosions status post biopsy which would not explain her clinical situation.  Colonoscopy found a single healing rectal ulcer in the distal rectum with surrounding mucosal friability and markedly abnormal proximal colon with ulceration.  Query recent ischemic event versus NSAID insult versus parastomal hernia playing a role.  Recommended repeat colonoscopy pending biopsy results.  Hemoglobin/hematocrit today appears relatively stable over the past 2 days at 9.0/27.6.  Pancolitis: Possibly infectious, inflammatory bowel disease in the differentials.  Could simply be a noninfectious gastroenteritis.  She will need a colonoscopy eventually, whether or not this is inpatient versus outpatient will depend on her clinical progress. Hasn't had stool studies, will check for completeness.  Elevated LFTs: Significant transaminitis with AST/ALT at 714/162, alk phos of 359 on admission.  Previous LFTs were essentially normal.  CT imaging suggestive of fatty liver disease.  Has not had alcohol in 2 years.  Given her pattern, possible choledocholithiasis versus passed stone/sludge.  Right upper quadrant ultrasound only with hepatic steatosis, normal CBD. Her LFTs continue to trend downward with AST/ALT 180/78, alk phos 210, total bilirubin remains normal..  Acute hepatitis panel is completely negative.  Given colonoscopy results cannot rule out ischemic episode/hypotension.   Plan: 1. Follow-up for biopsy results 2. Continue to monitor for any recurrent bleeding 3. Trend LFTs 4. Transfuse as necessary 5. Check stool studies 6. Advance diet 7. Avoid hypotensive episodes   Thank you for allowing [place] to participate in the care of [place], DNP, AGNP-C Adult & Gerontological Nurse Practitioner [facility]    LOS: 2 days    10/08/2020, 8:18 AM

## 2020-10-08 NOTE — Progress Notes (Signed)
Pt standing in doorway, looking out into hall, says (almost unintelligibly) that she's looking for "Kiki". Says Jeannett Senior has been talking junk and she wants to find her.  At first was worried about orientation but pt is oriented. Pt then walks down hall looking in the other patient rooms, waves at someone, then returns, spews some more unintelligible speech, then retreats back into her room.  "Jeannett Senior" is another name for a tech working on our unit, who, as far as I can tell, has had nothing to do with the patient.

## 2020-10-09 DIAGNOSIS — K922 Gastrointestinal hemorrhage, unspecified: Secondary | ICD-10-CM

## 2020-10-09 LAB — COMPREHENSIVE METABOLIC PANEL
ALT: 91 U/L — ABNORMAL HIGH (ref 0–44)
AST: 172 U/L — ABNORMAL HIGH (ref 15–41)
Albumin: 3.1 g/dL — ABNORMAL LOW (ref 3.5–5.0)
Alkaline Phosphatase: 266 U/L — ABNORMAL HIGH (ref 38–126)
Anion gap: 13 (ref 5–15)
BUN: <5 mg/dL — ABNORMAL LOW (ref 6–20)
CO2: 20 mmol/L — ABNORMAL LOW (ref 22–32)
Calcium: 8.9 mg/dL (ref 8.9–10.3)
Chloride: 103 mmol/L (ref 98–111)
Creatinine, Ser: 0.64 mg/dL (ref 0.44–1.00)
GFR, Estimated: >60 mL/min (ref >60)
Glucose, Bld: 179 mg/dL — ABNORMAL HIGH (ref 70–99)
Potassium: 3.4 mmol/L — ABNORMAL LOW (ref 3.5–5.1)
Sodium: 136 mmol/L (ref 135–145)
Total Bilirubin: 0.8 mg/dL (ref 0.3–1.2)
Total Protein: 7 g/dL (ref 6.5–8.1)

## 2020-10-09 LAB — MRSA PCR SCREENING: MRSA by PCR: NEGATIVE

## 2020-10-09 LAB — CBC
HCT: 33.3 % — ABNORMAL LOW (ref 36.0–46.0)
Hemoglobin: 10.5 g/dL — ABNORMAL LOW (ref 12.0–15.0)
MCH: 33.9 pg (ref 26.0–34.0)
MCHC: 31.5 g/dL (ref 30.0–36.0)
MCV: 107.4 fL — ABNORMAL HIGH (ref 80.0–100.0)
Platelets: 323 10*3/uL (ref 150–400)
RBC: 3.1 MIL/uL — ABNORMAL LOW (ref 3.87–5.11)
RDW: 12.7 % (ref 11.5–15.5)
WBC: 6.1 10*3/uL (ref 4.0–10.5)
nRBC: 0 % (ref 0.0–0.2)

## 2020-10-09 LAB — MAGNESIUM: Magnesium: 1.8 mg/dL (ref 1.7–2.4)

## 2020-10-09 MED ORDER — CHLORDIAZEPOXIDE HCL 25 MG PO CAPS
25.0000 mg | ORAL_CAPSULE | Freq: Three times a day (TID) | ORAL | Status: DC
  Administered 2020-10-09 – 2020-10-10 (×3): 25 mg via ORAL
  Filled 2020-10-09 (×3): qty 1

## 2020-10-09 MED ORDER — CHLORHEXIDINE GLUCONATE CLOTH 2 % EX PADS
6.0000 | MEDICATED_PAD | Freq: Every day | CUTANEOUS | Status: DC
  Administered 2020-10-09 – 2020-10-11 (×3): 6 via TOPICAL

## 2020-10-09 MED ORDER — POTASSIUM CHLORIDE CRYS ER 20 MEQ PO TBCR
40.0000 meq | EXTENDED_RELEASE_TABLET | Freq: Once | ORAL | Status: AC
  Administered 2020-10-09: 40 meq via ORAL
  Filled 2020-10-09 (×2): qty 2

## 2020-10-09 MED ORDER — FOLIC ACID 1 MG PO TABS
1.0000 mg | ORAL_TABLET | Freq: Every day | ORAL | Status: DC
  Administered 2020-10-09 – 2020-10-11 (×3): 1 mg via ORAL
  Filled 2020-10-09 (×3): qty 1

## 2020-10-09 MED ORDER — LORAZEPAM 2 MG/ML IJ SOLN
1.0000 mg | INTRAMUSCULAR | Status: DC | PRN
  Administered 2020-10-09 (×3): 4 mg via INTRAVENOUS
  Filled 2020-10-09 (×3): qty 2

## 2020-10-09 MED ORDER — LORAZEPAM 1 MG PO TABS
1.0000 mg | ORAL_TABLET | ORAL | Status: DC | PRN
  Administered 2020-10-09: 2 mg via ORAL
  Filled 2020-10-09: qty 2

## 2020-10-09 MED ORDER — THIAMINE HCL 100 MG PO TABS
100.0000 mg | ORAL_TABLET | Freq: Every day | ORAL | Status: DC
  Administered 2020-10-09 – 2020-10-11 (×2): 100 mg via ORAL
  Filled 2020-10-09 (×3): qty 1

## 2020-10-09 MED ORDER — THIAMINE HCL 100 MG/ML IJ SOLN
100.0000 mg | Freq: Every day | INTRAMUSCULAR | Status: DC
  Administered 2020-10-10: 100 mg via INTRAVENOUS
  Filled 2020-10-09: qty 2

## 2020-10-09 MED ORDER — ADULT MULTIVITAMIN W/MINERALS CH
1.0000 | ORAL_TABLET | Freq: Every day | ORAL | Status: DC
  Administered 2020-10-09 – 2020-10-11 (×3): 1 via ORAL
  Filled 2020-10-09 (×4): qty 1

## 2020-10-09 NOTE — Progress Notes (Signed)
PROGRESS NOTE    Alyssa Carey  TXM:[id number] DOB: [birth year] DOA: 10/05/2020 PCP: [facility]   Brief Narrative:   [name] a49 y.o.female,with history of alcohol dependence, necrotizing fasciitis requiring colostomy, hypertension, and more presents to the ED with a chief complaint of blood in the ostomy bag. Patient was hospitalized in June 2021 for necrotizing fasciitis that required diverting colostomy.Patient has been admitted for evaluation of acute GI bleed and is also noted to have transaminitis.  She has undergone EGD and colonoscopy on 3/24.    She remained stable from a GI bleed standpoint, however she has developed active hallucinations and appears to be in alcohol withdrawal.  Assessment & Plan:   Principal Problem:   Acute GI bleeding Active Problems:   Transaminitis   HTN (hypertension)   Mild protein malnutrition (HCC)   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CC)   Elevated alkaline phosphatase level   Abdominal pain   Parastomal hernia without obstruction or gangrene   Acute blood loss anemia   Acute blood loss anemia secondary to GI bleed-stable -EGD/colonoscopy on 3/24 with nonbleeding gastric ulcer and no stigmata of recent bleed along with colonoscopy with rectal ulcer and surrounding mucosal friability and possible signs of ischemia-biopsies pending -Continue to monitor CBC in a.m. and transfuse as needed for hemoglobin less than 7 -Continue PPI IV twice daily -Diet has been advanced -Stool studies pending -CT abdomen and pelvis with no acute findings  Acute encephalopathy with hallucinations -Suspect secondary to alcohol withdrawal -Son states that patient does drink on a regular basis 3 to 4 cans of 40 ounce beers -Is quite unsafe despite setting consult and will require transfer to stepdown unit for closer monitoring -Scheduled Librium -CIWA protocol -Avoid Precedex for now since no significant heart rate or blood pressure  elevation  Transaminitis-downtrending -May be related to hepatic steatosis noted on CT -Acute hepatitis panelnegative -Right upper quadrant ultrasoundwith findings of hepatic steatosis  Pruritus -Likely secondary to recent narcotic use which has now been listed as an allergy -No significant rash noted -Continue Pepcid and hold Benadryl -Atarax as needed -Eucerin cream to help with skin hydration  Hypertension -Currently with soft blood pressure readings -Continue to hold metoprolol  Chronic macrocytic anemia -Continue folic acid and thiamine  Mild protein calorie malnutrition -Continue protein shakes today  DVT prophylaxis:SCDs Code Status:Full Family Communication:Discussed with son on phone 3/26 Disposition Plan: Status is: Inpatient  Remains inpatient appropriate because:Ongoing diagnostic testing needed not appropriate for outpatient work up   Dispo: The patient is from:Home Anticipated d/c is QM:GNOI Patient currently is not medically stable to d/c. Difficult to place patient No   Consultants:  GI  Procedures:  See below  Antimicrobials:  None  Subjective: Patient seen and evaluated today with ongoing confusion and active hallucinations.  She has had significant issues with confusion that started yesterday evening requiring close monitoring.  No significant bleeding noted.  Objective: Vitals:   10/08/20 1006 10/08/20 1430 10/08/20 2054 10/09/20 0958  BP: 96/61 112/66 103/77 110/78  Pulse: 95 90 86 91  Resp: 16 17 20 16   Temp: 97.9 F (36.6 C) 98.1 F (36.7 C) 98.6 F (37 C) 98.7 F (37.1 C)  TempSrc: Oral Oral Oral Oral  SpO2: 100% 100% 100% 100%  Weight:      Height:        Intake/Output Summary (Last 24 hours) at 10/09/2020 1059 Last data filed at 10/08/2020 1300 Gross per 24 hour  Intake 240 ml  Output --  Net 240 ml  Filed Weights   10/05/20 1409  Weight: 78.1 kg     Examination:  General exam: Appears confused and is hallucinating Respiratory system: Clear to auscultation. Respiratory effort normal. Cardiovascular system: S1 & S2 heard, RRR.  Gastrointestinal system: Abdomen is soft, colostomy with no active bleeding Central nervous system: Alert and awake, confused and hallucinating Extremities: No edema Skin: No significant lesions noted Psychiatry: Delirious    Data Reviewed: I have personally reviewed following labs and imaging studies  CBC: Recent Labs  Lab 10/05/20 1648 10/06/20 0025 10/06/20 0532 10/06/20 1649 10/07/20 0505 10/08/20 0420 10/09/20 0541  WBC 3.7* 3.9* 4.2  --  4.1 4.0 6.1  NEUTROABS 1.4*  --   --   --  3.1  --   --   HGB 12.2 10.1* 9.3* 9.5* 9.7* 9.0* 10.5*  HCT 37.0 30.6* 28.5* 29.7* 28.6* 27.6* 33.3*  MCV 103.6* 103.4* 104.4*  --  104.8* 105.3* 107.4*  PLT 184 166 155  --  189 174 469   Basic Metabolic Panel: Recent Labs  Lab 10/05/20 1648 10/06/20 0532 10/07/20 0505 10/08/20 0420 10/09/20 0541  NA 137 139 134* 135 136  K 3.7 3.9 3.6 3.6 3.4*  CL 100 104 103 105 103  CO2 25 25 22  20* 20*  GLUCOSE 108* 87 83 72 179*  BUN <5* <5* <5* <5* <5*  CREATININE 0.40* 0.44 0.47 0.54 0.64  CALCIUM 8.6* 7.9* 7.8* 7.9* 8.9  MG  --  1.4* 1.9 1.8 1.8   GFR: Estimated Creatinine Clearance: 74.7 mL/min (by C-G formula based on SCr of 0.64 mg/dL). Liver Function Tests: Recent Labs  Lab 10/05/20 1648 10/06/20 0532 10/07/20 0505 10/08/20 0420 10/09/20 0541  AST 714* 466* 227* 184* 172*  ALT 162* 119* 85* 78* 91*  ALKPHOS 359* 270* 216* 210* 266*  BILITOT 0.5 0.9 0.9 1.0 0.8  PROT 7.0 5.2* 4.6* 5.1* 7.0  ALBUMIN 3.1* 2.4* 2.2* 2.3* 3.1*   No results for input(s): LIPASE, AMYLASE in the last 168 hours. Recent Labs  Lab 10/05/20 1657 10/08/20 1855  AMMONIA 40* 25   Coagulation Profile: Recent Labs  Lab 10/05/20 1648 10/07/20 0505  INR 1.0 1.1   Cardiac Enzymes: No results for input(s):  CKTOTAL, CKMB, CKMBINDEX, TROPONINI in the last 168 hours. BNP (last 3 results) No results for input(s): PROBNP in the last 8760 hours. HbA1C: No results for input(s): HGBA1C in the last 72 hours. CBG: No results for input(s): GLUCAP in the last 168 hours. Lipid Profile: No results for input(s): CHOL, HDL, LDLCALC, TRIG, CHOLHDL, LDLDIRECT in the last 72 hours. Thyroid Function Tests: No results for input(s): TSH, T4TOTAL, FREET4, T3FREE, THYROIDAB in the last 72 hours. Anemia Panel: No results for input(s): VITAMINB12, FOLATE, FERRITIN, TIBC, IRON, RETICCTPCT in the last 72 hours. Sepsis Labs: No results for input(s): PROCALCITON, LATICACIDVEN in the last 168 hours.  Recent Results (from the past 240 hour(s))  Resp Panel by RT-PCR (Flu A&B, Covid) Nasopharyngeal Swab     Status: None   Collection Time: 10/05/20  9:14 PM   Specimen: Nasopharyngeal Swab; Nasopharyngeal(NP) swabs in vial transport medium  Result Value Ref Range Status   SARS Coronavirus 2 by RT PCR NEGATIVE NEGATIVE Final    Comment: (NOTE) SARS-CoV-2 target nucleic acids are NOT DETECTED.  The SARS-CoV-2 RNA is generally detectable in upper respiratory specimens during the acute phase of infection. The lowest concentration of SARS-CoV-2 viral copies this assay can detect is 138 copies/mL. A negative result does not preclude  SARS-Cov-2 infection and should not be used as the sole basis for treatment or other patient management decisions. A negative result may occur with  improper specimen collection/handling, submission of specimen other than nasopharyngeal swab, presence of viral mutation(s) within the areas targeted by this assay, and inadequate number of viral copies(<138 copies/mL). A negative result must be combined with clinical observations, patient history, and epidemiological information. The expected result is Negative.  Fact Sheet for Patients:  EntrepreneurPulse.com.au  Fact Sheet  for Healthcare Providers:  IncredibleEmployment.be  This test is no t yet approved or cleared by the [place] FDA and  has been authorized for detection and/or diagnosis of SARS-CoV-2 by FDA under an Emergency Use Authorization (EUA). This EUA will remain  in effect (meaning this test can be used) for the duration of the COVID-19 declaration under Section 564(b)(1) of the Act, 21 U.S.C.section 360bbb-3(b)(1), unless the authorization is terminated  or revoked sooner.       Influenza A by PCR NEGATIVE NEGATIVE Final   Influenza B by PCR NEGATIVE NEGATIVE Final    Comment: (NOTE) The Xpert Xpress SARS-CoV-2/FLU/RSV plus assay is intended as an aid in the diagnosis of influenza from Nasopharyngeal swab specimens and should not be used as a sole basis for treatment. Nasal washings and aspirates are unacceptable for Xpert Xpress SARS-CoV-2/FLU/RSV testing.  Fact Sheet for Patients: EntrepreneurPulse.com.au  Fact Sheet for Healthcare Providers: IncredibleEmployment.be  This test is not yet approved or cleared by the [place] FDA and has been authorized for detection and/or diagnosis of SARS-CoV-2 by FDA under an Emergency Use Authorization (EUA). This EUA will remain in effect (meaning this test can be used) for the duration of the COVID-19 declaration under Section 564(b)(1) of the Act, 21 U.S.C. section 360bbb-3(b)(1), unless the authorization is terminated or revoked.  Performed at [facility], [address], [place], [place] [address]   C Difficile Quick Screen (NO PCR Reflex)     Status: None   Collection Time: 10/08/20 10:15 AM   Specimen: STOOL  Result Value Ref Range Status   C Diff antigen NEGATIVE NEGATIVE Final   C Diff toxin NEGATIVE NEGATIVE Final   C Diff interpretation No C. difficile detected.  Final    Comment: Performed at [facility], [address] [place], [place] [address]  Culture, blood  (routine x 2)     Status: None (Preliminary result)   Collection Time: 10/09/20  8:49 AM   Specimen: BLOOD  Result Value Ref Range Status   Specimen Description BLOOD BLOOD LEFT ARM  Final   Special Requests   Final    BOTTLES DRAWN AEROBIC AND ANAEROBIC Blood Culture adequate volume Performed at [facility], [address], [place], [place] [address]    Culture PENDING  Incomplete   Report Status PENDING  Incomplete  Culture, blood (routine x 2)     Status: None (Preliminary result)   Collection Time: 10/09/20  8:49 AM   Specimen: BLOOD RIGHT HAND  Result Value Ref Range Status   Specimen Description BLOOD RIGHT HAND  Final   Special Requests   Final    BOTTLES DRAWN AEROBIC ONLY Blood Culture results may not be optimal due to an inadequate volume of blood received in culture bottles Performed at [facility], [address] [place], [place] [address]    Culture PENDING  Incomplete   Report Status PENDING  Incomplete         Radiology Studies: No results found.  Scheduled Meds:  chlordiazePOXIDE  25 mg Oral TID   famotidine  20 mg Oral BID   ferrous sulfate  325 mg Oral BID WC   folic acid  1 mg Oral Daily   hydrocerin   Topical TID   melatonin  3 mg Oral QHS   multivitamin with minerals  1 tablet Oral Daily   pantoprazole (PROTONIX) IV  40 mg Intravenous Q12H   potassium chloride  40 mEq Oral Once   thiamine  100 mg Oral Daily   Or   thiamine  100 mg Intravenous Daily    LOS: 3 days    Time spent: 35 minutes    Brigham Cobbins Darleen Crocker, DO [facility]  If 7PM-7AM, please contact night-coverage [email or web address] 10/09/2020, 10:59 AM

## 2020-10-09 NOTE — TOC Progression Note (Signed)
Transition of Care [facility]) - Progression Note    Patient Details  Name: Alyssa Carey MRN: [id number] Date of Birth: 09-07-1970  [facility] CM/SW Contact  Natasha Bence, LCSW Phone Number: 10/09/2020, 12:59 PM  Clinical Narrative:    CSW received SA consult for patient. CSW spoke with patient and provided SA resources to patient. TOC to follow.   Expected Discharge Plan: Home/Self Care Barriers to Discharge: Continued Medical Work up  Expected Discharge Plan and Services Expected Discharge Plan: Home/Self Care In-house Referral: Financial Counselor Discharge Planning Services: NA Post Acute Care Choice: NA Living arrangements for the past 2 months: Single Family Home                 DME Arranged: N/A DME Agency: NA       HH Arranged: NA HH Agency: NA         Social Determinants of Health (SDOH) Interventions    Readmission Risk Interventions No flowsheet data found.

## 2020-10-09 NOTE — Progress Notes (Signed)
Pt currently allowing lab to stick her for blood cultures. Pt has been up in bathroom, will not allow [place] to touch her colostomy to get ordered stool specimen. Pt's breakfast set up for her and potassium pills dissolved in water because pt states she cannot swallow big pills due to throat pain. Pt talking about her pay-per-view subscription and cannot understand why she can't watch the movie she wants. Again, attempted to reorient pt to place and time but unsuccessful. Dr. Manuella Ghazi in briefly and spoke to pt, states,will return for eval once lab draw is completed.

## 2020-10-09 NOTE — Progress Notes (Signed)
Patient agitated at around 1158 pm, packing things and leaving room, pt confused to situation and place, alert to self. Patient redirected and reoriented. Security called for help as patient keeps on issiting to  Leave the room. MD made aware. meds given as ordered. Patient calmed down and returned back to room. agitation and confusion continues . Will monitor patient.

## 2020-10-09 NOTE — Progress Notes (Signed)
Pt remains oriented to person only. Sister at bedside and notified of current pt condition and plan to transfer to ICU stepdown, states understanding. Pt remains restless with visual and audible hallucinations, tremors of hands and arms. Pt able to stand with assist x2 and transferred to wheelchair, very unsteady on feet. Pt taken via WC to ICU by staff member, all personal belongings with patient and sister.

## 2020-10-09 NOTE — Progress Notes (Signed)
Subjective:   Patient has no complaints.  She denies abdominal pain.  Hoobler melena or bleeding reported into colostomy bag according to nursing staff.  Patient now has been confused and irritable.  He is being treated for alcohol withdrawal. According to patient's Sister Sharyn Lull who is at bedside she has been drinking beer every day.  Patient had told told us 3 days ago that she had quit drinking alcohol 2 years ago.   Current Facility-Administered Medications:  .  acetaminophen (TYLENOL) tablet 650 mg, 650 mg, Oral, Q6H PRN, 650 mg at 10/09/20 0808 **OR** acetaminophen (TYLENOL) suppository 650 mg, 650 mg, Rectal, Q6H PRN, Zierle-Ghosh, Asia B, DO .  camphor-menthol (SARNA) lotion, , Topical, PRN, Heath Lark D, DO, Given at 10/06/20 [phone] .  chlordiazePOXIDE (LIBRIUM) capsule 25 mg, 25 mg, Oral, TID, Manuella Ghazi, Pratik D, DO, 25 mg at 10/09/20 1128 .  Chlorhexidine Gluconate Cloth 2 % PADS 6 each, 6 each, Topical, Daily, Heath Lark D, DO, 6 each at 10/09/20 1208 .  famotidine (PEPCID) tablet 20 mg, 20 mg, Oral, BID, Manuella Ghazi, Pratik D, DO, 20 mg at 10/09/20 0818 .  ferrous sulfate tablet 325 mg, 325 mg, Oral, BID WC, Zierle-Ghosh, Asia B, DO, 325 mg at 10/09/20 0809 .  folic acid (FOLVITE) tablet 1 mg, 1 mg, Oral, Daily, Manuella Ghazi, Pratik D, DO, 1 mg at 10/09/20 3536 .  haloperidol lactate (HALDOL) injection 2 mg, 2 mg, Intravenous, Q6H PRN, Manuella Ghazi, Pratik D, DO, 2 mg at 10/09/20 0055 .  hydrocerin (EUCERIN) cream, , Topical, TID, Manuella Ghazi, Pratik D, DO, Given at 10/08/20 2229 .  hydrOXYzine (ATARAX/VISTARIL) tablet 25 mg, 25 mg, Oral, TID PRN, Manuella Ghazi, Pratik D, DO, 25 mg at 10/08/20 0015 .  LORazepam (ATIVAN) tablet 1-4 mg, 1-4 mg, Oral, Q1H PRN, 2 mg at 10/09/20 0808 **OR** LORazepam (ATIVAN) injection 1-4 mg, 1-4 mg, Intravenous, Q1H PRN, Manuella Ghazi, Pratik D, DO, 4 mg at 10/09/20 1215 .  melatonin tablet 3 mg, 3 mg, Oral, QHS, Zierle-Ghosh, Asia B, DO, 3 mg at 10/08/20 2217 .  multivitamin with minerals tablet 1  tablet, 1 tablet, Oral, Daily, Heath Lark D, DO, 1 tablet at 10/09/20 0809 .  ondansetron (ZOFRAN) tablet 4 mg, 4 mg, Oral, Q6H PRN **OR** ondansetron (ZOFRAN) injection 4 mg, 4 mg, Intravenous, Q6H PRN, Zierle-Ghosh, Asia B, DO, 4 mg at 10/07/20 2003 .  pantoprazole (PROTONIX) injection 40 mg, 40 mg, Intravenous, Q12H, Zierle-Ghosh, Asia B, DO, 40 mg at 10/09/20 0803 .  thiamine tablet 100 mg, 100 mg, Oral, Daily, 100 mg at 10/09/20 0808 **OR** thiamine (B-1) injection 100 mg, 100 mg, Intravenous, Daily, Manuella Ghazi, Pratik D, DO .  traZODone (DESYREL) tablet 100 mg, 100 mg, Oral, QHS PRN, Zierle-Ghosh, Asia B, DO, 100 mg at 10/08/20 2326   Objective: Blood pressure 135/84, pulse 86, temperature 98.6 F (37 C), temperature source Oral, resp. rate (!) 27, height 5' 1"  (1.549 m), weight 80.7 kg, SpO2 99 %. Patient is alert.  She is sitting in her bed. She answers questions appropriately. Abdomen.  Colostomy bag has small amount of brownish stool.  Abdomen is full but soft and nontender with organomegaly or masses. She has pitting and on pitting pretibial edema.  Most of her edema appears to be nonpitting.  Labs/studies Results:  CBC Latest Ref Rng & Units 10/09/2020 10/08/2020 10/07/2020  WBC 4.0 - 10.5 K/uL 6.1 4.0 4.1  Hemoglobin 12.0 - 15.0 g/dL 10.5(L) 9.0(L) 9.7(L)  Hematocrit 36.0 - 46.0 % 33.3(L) 27.6(L) 28.6(L)  Platelets 150 -  400 K/uL 323 174 189    CMP Latest Ref Rng & Units 10/09/2020 10/08/2020 10/07/2020  Glucose 70 - 99 mg/dL 179(H) 72 83  BUN 6 - 20 mg/dL <5(L) <5(L) <5(L)  Creatinine 0.44 - 1.00 mg/dL 0.64 0.54 0.47  Sodium 135 - 145 mmol/L 136 135 134(L)  Potassium 3.5 - 5.1 mmol/L 3.4(L) 3.6 3.6  Chloride 98 - 111 mmol/L 103 105 103  CO2 22 - 32 mmol/L 20(L) 20(L) 22  Calcium 8.9 - 10.3 mg/dL 8.9 7.9(L) 7.8(L)  Total Protein 6.5 - 8.1 g/dL 7.0 5.1(L) 4.6(L)  Total Bilirubin 0.3 - 1.2 mg/dL 0.8 1.0 0.9  Alkaline Phos 38 - 126 U/L 266(H) 210(H) 216(H)  AST 15 - 41 U/L 172(H)  184(H) 227(H)  ALT 0 - 44 U/L 91(H) 78(H) 85(H)    Hepatic Function Latest Ref Rng & Units 10/09/2020 10/08/2020 10/07/2020  Total Protein 6.5 - 8.1 g/dL 7.0 5.1(L) 4.6(L)  Albumin 3.5 - 5.0 g/dL 3.1(L) 2.3(L) 2.2(L)  AST 15 - 41 U/L 172(H) 184(H) 227(H)  ALT 0 - 44 U/L 91(H) 78(H) 85(H)  Alk Phosphatase 38 - 126 U/L 266(H) 210(H) 216(H)  Total Bilirubin 0.3 - 1.2 mg/dL 0.8 1.0 0.9  Bilirubin, Direct 0.0 - 0.2 mg/dL - - -     Assessment:  #1.  GI bleed secondary to scattered colonic ulcers possibly due to NSAID induced injury or ischemic colitis.  Biopsies and GI pathogen panel are pending.  Bleeding has stopped. She will need to be rescoped at some point prior to reversal of her colostomy.  This will be coordinated with her surgeon Dr. Leighton Ruff.  #2.  Elevated transaminases.  Markers for hepatitis A, B, and C are negative.  Imaging studies negative for cholelithiasis/choledocho or dilated bile ducts. When I saw her in consultation with Mr. Walden Field, NP she denied drinking alcohol.  She stated she had quit 2 years ago.  Now she is having withdrawal symptoms and it turns out she has been drinking alcohol daily which is also corroborated by her Darvin Neighbours. At any rate degree of elevated transaminases much higher than 1 would expect with alcoholic hepatitis.  It is possible that the hepatic injury is ischemic and elevated alkaline phosphatase secondary to alcohol ingestion.  #3.  Rectal ulcer.  This was diagnosed in June last year.  She was deemed to have solitary rectal ulcer.  Dr. Gala Romney noted that this ulcer appears to be healing.  #4.  Anemia secondary to GI bleed.  She has not required blood transfusion.  Hemoglobin is gradually coming up.  #5.  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 Wait for results of GI pathogen panel and colonic biopsies.

## 2020-10-09 NOTE — Progress Notes (Signed)
   10/09/20 1000  What Happened  Was fall witnessed? Yes  Who witnessed fall? Lowry Ram, RN  Patients activity before fall ambulating-unassisted (Possible alcohol withdrawal, had been walking in room and being watched due to confusion.  Was going through personal belongings bag, lost balance and backed against wall and slid to floor)  Point of contact back;buttocks  Was patient injured? No  Follow Up  MD notified Dr. Manuella Ghazi  Time MD notified 1000  Family notified No - patient refusal  Simple treatment Other (comment) (will move to stepdown)  Progress note created (see row info) Yes

## 2020-10-09 NOTE — Progress Notes (Signed)
Patient still having hallucinations visual and auditory however she remains pleasant. Disoriented to time, place, and situation. Currently resting in bed not pulling at cords or lines. Will continue to monitor.

## 2020-10-09 NOTE — Progress Notes (Signed)
Pt sitting up in chair in room, wrapped in blankets. Pt oriented to person only. When asked where she is, pt states, "I'm in the hotel in [place]." Reoriented pt to place, time and situation, pt says, "Oh, [place]. Whatever." Pt c/o throat pain and headache, requested Tylenol. Pt also states that she cannot swallow big pills because her throat hurts from the doctor running the light down her throat this week. Pt with noted tremors of hands and fingers. Pt guarded in her behavior, asking a lot of questions about what I am doing and why, advises, "Vivien Rota says you better put on gloves before you touch my medicines. 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 When I told pt that I did not see anyone standing there, she said, "Are you questioning me? You think you're awful smart, questioning what she know. She been doing that a long time." Unable to reorient pt, Pt talking to "Vivien Rota" in the room about going to church this Sunday. Otherwise, pt is cooperative at this time. MS Manuella Ghazi notified of pt's current status.

## 2020-10-10 LAB — COMPREHENSIVE METABOLIC PANEL
ALT: 70 U/L — ABNORMAL HIGH (ref 0–44)
AST: 135 U/L — ABNORMAL HIGH (ref 15–41)
Albumin: 2.6 g/dL — ABNORMAL LOW (ref 3.5–5.0)
Alkaline Phosphatase: 212 U/L — ABNORMAL HIGH (ref 38–126)
Anion gap: 9 (ref 5–15)
BUN: <5 mg/dL — ABNORMAL LOW (ref 6–20)
CO2: 22 mmol/L (ref 22–32)
Calcium: 8.3 mg/dL — ABNORMAL LOW (ref 8.9–10.3)
Chloride: 103 mmol/L (ref 98–111)
Creatinine, Ser: 0.47 mg/dL (ref 0.44–1.00)
GFR, Estimated: >60 mL/min (ref >60)
Glucose, Bld: 75 mg/dL (ref 70–99)
Potassium: 3.3 mmol/L — ABNORMAL LOW (ref 3.5–5.1)
Sodium: 134 mmol/L — ABNORMAL LOW (ref 135–145)
Total Bilirubin: 0.8 mg/dL (ref 0.3–1.2)
Total Protein: 5.8 g/dL — ABNORMAL LOW (ref 6.5–8.1)

## 2020-10-10 LAB — CBC
HCT: 31.2 % — ABNORMAL LOW (ref 36.0–46.0)
Hemoglobin: 9.9 g/dL — ABNORMAL LOW (ref 12.0–15.0)
MCH: 34 pg (ref 26.0–34.0)
MCHC: 31.7 g/dL (ref 30.0–36.0)
MCV: 107.2 fL — ABNORMAL HIGH (ref 80.0–100.0)
Platelets: 210 10*3/uL (ref 150–400)
RBC: 2.91 MIL/uL — ABNORMAL LOW (ref 3.87–5.11)
RDW: 13 % (ref 11.5–15.5)
WBC: 4.4 10*3/uL (ref 4.0–10.5)
nRBC: 0 % (ref 0.0–0.2)

## 2020-10-10 LAB — MAGNESIUM: Magnesium: 1.7 mg/dL (ref 1.7–2.4)

## 2020-10-10 MED ORDER — POTASSIUM CHLORIDE CRYS ER 20 MEQ PO TBCR
40.0000 meq | EXTENDED_RELEASE_TABLET | Freq: Two times a day (BID) | ORAL | Status: AC
  Administered 2020-10-10 (×2): 40 meq via ORAL
  Filled 2020-10-10 (×2): qty 2

## 2020-10-10 MED ORDER — METOPROLOL TARTRATE 25 MG PO TABS
25.0000 mg | ORAL_TABLET | Freq: Two times a day (BID) | ORAL | Status: DC
  Administered 2020-10-10: 25 mg via ORAL
  Filled 2020-10-10: qty 1

## 2020-10-10 MED ORDER — CHLORDIAZEPOXIDE HCL 5 MG PO CAPS
10.0000 mg | ORAL_CAPSULE | Freq: Three times a day (TID) | ORAL | Status: DC
  Administered 2020-10-10 (×2): 10 mg via ORAL
  Filled 2020-10-10 (×3): qty 2

## 2020-10-10 NOTE — Progress Notes (Signed)
Subjective:   According to nursing staff she did not eat her breakfast she was sleeping.  Patient says she has been drinking liquids.  She says she will try to eat lunch.  She denies abdominal pain.  No blood or tarry stool noted in colostomy bag.   Current Facility-Administered Medications:  .  acetaminophen (TYLENOL) tablet 650 mg, 650 mg, Oral, Q6H PRN, 650 mg at 10/09/20 0808 **OR** acetaminophen (TYLENOL) suppository 650 mg, 650 mg, Rectal, Q6H PRN, Zierle-Ghosh, Asia B, DO .  camphor-menthol (SARNA) lotion, , Topical, PRN, Heath Lark D, DO, Given at 10/06/20 [phone] .  chlordiazePOXIDE (LIBRIUM) capsule 25 mg, 25 mg, Oral, TID, Manuella Ghazi, Pratik D, DO, 25 mg at 10/10/20 1010 .  Chlorhexidine Gluconate Cloth 2 % PADS 6 each, 6 each, Topical, Daily, Manuella Ghazi, Pratik D, DO, 6 each at 10/10/20 1010 .  famotidine (PEPCID) tablet 20 mg, 20 mg, Oral, BID, Manuella Ghazi, Pratik D, DO, 20 mg at 10/10/20 1010 .  ferrous sulfate tablet 325 mg, 325 mg, Oral, BID WC, Zierle-Ghosh, Asia B, DO, 325 mg at 82/50/03 7048 .  folic acid (FOLVITE) tablet 1 mg, 1 mg, Oral, Daily, Manuella Ghazi, Pratik D, DO, 1 mg at 10/10/20 1010 .  haloperidol lactate (HALDOL) injection 2 mg, 2 mg, Intravenous, Q6H PRN, Manuella Ghazi, Pratik D, DO, 2 mg at 10/09/20 0055 .  hydrocerin (EUCERIN) cream, , Topical, TID, Manuella Ghazi, Pratik D, DO, Given at 10/10/20 1011 .  hydrOXYzine (ATARAX/VISTARIL) tablet 25 mg, 25 mg, Oral, TID PRN, Manuella Ghazi, Pratik D, DO, 25 mg at 10/08/20 0015 .  LORazepam (ATIVAN) tablet 1-4 mg, 1-4 mg, Oral, Q1H PRN, 2 mg at 10/09/20 0808 **OR** LORazepam (ATIVAN) injection 1-4 mg, 1-4 mg, Intravenous, Q1H PRN, Manuella Ghazi, Pratik D, DO, 4 mg at 10/09/20 1716 .  melatonin tablet 3 mg, 3 mg, Oral, QHS, Zierle-Ghosh, Asia B, DO, 3 mg at 10/08/20 2217 .  metoprolol tartrate (LOPRESSOR) tablet 25 mg, 25 mg, Oral, BID, Manuella Ghazi, Pratik D, DO, 25 mg at 10/10/20 1149 .  multivitamin with minerals tablet 1 tablet, 1 tablet, Oral, Daily, Manuella Ghazi, Pratik D, DO, 1 tablet at  10/10/20 1010 .  ondansetron (ZOFRAN) tablet 4 mg, 4 mg, Oral, Q6H PRN **OR** ondansetron (ZOFRAN) injection 4 mg, 4 mg, Intravenous, Q6H PRN, Zierle-Ghosh, Asia B, DO, 4 mg at 10/07/20 2003 .  pantoprazole (PROTONIX) injection 40 mg, 40 mg, Intravenous, Q12H, Zierle-Ghosh, Asia B, DO, 40 mg at 10/10/20 1008 .  potassium chloride SA (KLOR-CON) CR tablet 40 mEq, 40 mEq, Oral, BID, Manuella Ghazi, Pratik D, DO, 40 mEq at 10/10/20 1010 .  thiamine tablet 100 mg, 100 mg, Oral, Daily, 100 mg at 10/09/20 0808 **OR** thiamine (B-1) injection 100 mg, 100 mg, Intravenous, Daily, Manuella Ghazi, Pratik D, DO, 100 mg at 10/10/20 1008 .  traZODone (DESYREL) tablet 100 mg, 100 mg, Oral, QHS PRN, Zierle-Ghosh, Asia B, DO, 100 mg at 10/08/20 2326   Objective: Blood pressure (!) 133/100, pulse 86, temperature 98.7 F (37.1 C), temperature source Oral, resp. rate (!) 31, height _0  (1.549 m), weight 80.7 kg, SpO2 96 %. Patient is drowsy.  She does respond appropriately to questions.  She knows that she is at [facility]. Abdomen.  Abdominal exam reveals soft abdomen with organomegaly or masses.  She has colostomy in left side of abdomen.  Colostomy bag is small amount of liquid brown stool.   Labs/studies Results:   CBC Latest Ref Rng & Units 10/10/2020 10/09/2020 10/08/2020  WBC 4.0 - 10.5 K/uL 4.4 6.1  4.0  Hemoglobin 12.0 - 15.0 g/dL 9.9(L) 10.5(L) 9.0(L)  Hematocrit 36.0 - 46.0 % 31.2(L) 33.3(L) 27.6(L)  Platelets 150 - 400 K/uL 210 323 174    CMP Latest Ref Rng & Units 10/10/2020 10/09/2020 10/08/2020  Glucose 70 - 99 mg/dL 75 179(H) 72  BUN 6 - 20 mg/dL <5(L) <5(L) <5(L)  Creatinine 0.44 - 1.00 mg/dL 0.47 0.64 0.54  Sodium 135 - 145 mmol/L 134(L) 136 135  Potassium 3.5 - 5.1 mmol/L 3.3(L) 3.4(L) 3.6  Chloride 98 - 111 mmol/L 103 103 105  CO2 22 - 32 mmol/L 22 20(L) 20(L)  Calcium 8.9 - 10.3 mg/dL 8.3(L) 8.9 7.9(L)  Total Protein 6.5 - 8.1 g/dL 5.8(L) 7.0 5.1(L)  Total Bilirubin 0.3 - 1.2 mg/dL 0.8 0.8 1.0   Alkaline Phos 38 - 126 U/L 212(H) 266(H) 210(H)  AST 15 - 41 U/L 135(H) 172(H) 184(H)  ALT 0 - 44 U/L 70(H) 91(H) 78(H)    Hepatic Function Latest Ref Rng & Units 10/10/2020 10/09/2020 10/08/2020  Total Protein 6.5 - 8.1 g/dL 5.8(L) 7.0 5.1(L)  Albumin 3.5 - 5.0 g/dL 2.6(L) 3.1(L) 2.3(L)  AST 15 - 41 U/L 135(H) 172(H) 184(H)  ALT 0 - 44 U/L 70(H) 91(H) 78(H)  Alk Phosphatase 38 - 126 U/L 212(H) 266(H) 210(H)  Total Bilirubin 0.3 - 1.2 mg/dL 0.8 0.8 1.0  Bilirubin, Direct 0.0 - 0.2 mg/dL - - -     Assessment:  #1.  GI bleed.  She presented with melena and burgundy stool.  EGD revealed small superficial healing antral ulcer without stigmata of bleeding.  Colonoscopy revealed scattered ulcers throughout the colon.  Distribution not typical for ischemic injury.  Stool GI pathogen panel is still pending.  Bleeding has stopped.  She has not required blood transfusion.  H&H is low but stable.  #2.  Abnormal LFTs.  Transaminases will continue to trend downwards although there was slight upward bump yesterday.  Imaging studies suggested fatty liver but her transaminases are much higher than one would expect with fatty liver unless it is acute fatty liver in which case transaminases can be high.  Markers for hepatitis A, B, and C are negative.  This injury could be due to hepatic ischemia since she had low blood pressure on admission.  Elevated alkaline phosphatase is usually not seen this high with ischemic injury and may well be due to excessive alcohol use.  #3.  Rectal ulcer.  This was diagnosed in June last year.  She was deemed to have solitary rectal ulcer.  Dr. Gala Romney noted that this ulcer appears to be healing.  #4.  Anemia secondary to GI bleed.  She has not required blood transfusion.  Hemoglobin is gradually coming up.  H&H is low but stable.  #5.  Alcohol withdrawal.  Patient was transferred to ICU.  She is receiving on CIWA protocol.  Recommendations  Follow-up on GI pathogen  panel. Wait for results of gastric and colonic biopsy.

## 2020-10-10 NOTE — Progress Notes (Signed)
Patient attempted to get out of bed, bed alarm activated. Nurse to bedside, patient stated she needed to go to the bathroom. Educated patient on safety and instructed to call for assistance when she needs to get up. Patient ambulated to commode, however has unsteady gait. Patient alert and oriented but having delusions about being at [employer] and books on the floor. Reoriented patient and transferred back to bed. All side rails up, non-slip socks on, bed alarm arm, and call bell within reach.

## 2020-10-10 NOTE — Progress Notes (Signed)
PROGRESS NOTE    Alyssa Carey  ONG:[id number] DOB: February 23, 1971 DOA: 10/05/2020 PCP: [facility]   Brief Narrative:   [name] a49 y.o.female,with history of alcohol dependence, necrotizing fasciitis requiring colostomy, hypertension, and more presents to the ED with a chief complaint of blood in the ostomy bag. Patient was hospitalized in June 2021 for necrotizing fasciitis that required diverting colostomy.Patient has been admitted for evaluation of acute GI bleed and is also noted to have transaminitis.She has undergone EGD and colonoscopy on 3/24.   She remained stable from a GI bleed standpoint, however she has developed active hallucinations and appears to be in alcohol withdrawal.  Assessment & Plan:   Principal Problem:   Acute GI bleeding Active Problems:   Transaminitis   HTN (hypertension)   Mild protein malnutrition (HCC)   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CC)   Elevated alkaline phosphatase level   Abdominal pain   Parastomal hernia without obstruction or gangrene   Acute blood loss anemia   Acute blood loss anemia secondary to GI bleed-stable -EGD/colonoscopy on 3/24 with nonbleeding gastric ulcer and no stigmata of recent bleed along with colonoscopy with rectal ulcer and surrounding mucosal friability and possible signs of ischemia-biopsies pending -Continue to monitor CBC in a.m. and transfuse as needed for hemoglobin less than 7 -Continue PPI IV twice daily -Diet has been advanced -Stool studies with cdiff negative -CT abdomen and pelvis with no acute findings  Acute encephalopathy with hallucinations and confusion ongoing -Suspect secondary to alcohol withdrawal -Son states that patient does drink on a regular basis 3 to 4 cans of 40 ounce beers -Is quite unsafe despite setting consult and will require transfer to stepdown unit for closer monitoring -Scheduled Librium ongoing -CIWA protocol -Avoid Precedex for now since no significant heart  rate or blood pressure elevation -Continue SDU monitoring  Transaminitis-downtrending -May be related to hepatic steatosis noted on CT -Acute hepatitis panelnegative -Right upper quadrant ultrasoundwith findings of hepatic steatosis  Pruritus -Likely secondary to recent narcotic use which has now been listed as an allergy -No significant rash noted -Continue Pepcid and hold Benadryl -Atarax as needed -Eucerin cream to help with skin hydration  Hypertension -Currently with soft blood pressure readings -Okay to resume some metoprolol with higher readings noted earlier today  Chronic macrocytic anemia-stable -Continue folic acid and thiamine  Mild protein calorie malnutrition -Continue protein shakestoday  DVT prophylaxis:SCDs Code Status:Full Family Communication:Discussed with son on phone 3/27 Disposition Plan: Status is: Inpatient  Remains inpatient appropriate because:Ongoing diagnostic testing needed not appropriate for outpatient work up   Dispo: The patient is from:Home Anticipated d/c is GM:[facility] Patient currently is not medically stable to d/c. Difficult to place patient No   Consultants:  GI  Procedures:  See below  Antimicrobials:  None   Subjective: Patient seen and evaluated today with ongoing confusion and tremors in her hands. Noted to have some elevated blood pressures this morning.  No significant bloody output noted in ostomy.  Objective: Vitals:   10/10/20 0300 10/10/20 0400 10/10/20 0730 10/10/20 0800  BP: (!) 147/104  (!) 131/100 119/81  Pulse: 83   81  Resp: (!) 29  (!) 27 (!) 34  Temp:  98.9 F (37.2 C) 98.7 F (37.1 C)   TempSrc:  Oral Oral   SpO2: 98%   97%  Weight:      Height:        Intake/Output Summary (Last 24 hours) at 10/10/2020 1045 Last data filed at 10/10/2020 0500 Gross per 24 hour  Intake 30 ml  Output 300 ml  Net -270 ml   Filed Weights    10/05/20 1409 10/09/20 1207  Weight: 78.1 kg 80.7 kg    Examination:  General exam: Appears minimally agitated and confused Respiratory system: Clear to auscultation. Respiratory effort normal. Cardiovascular system: S1 & S2 heard, RRR.  Gastrointestinal system: Abdomen is soft, ostomy without bloody output Central nervous system: Alert and awake Extremities: No edema, continues to have tremors in her hands Skin: No significant lesions noted Psychiatry: Flat affect.    Data Reviewed: I have personally reviewed following labs and imaging studies  CBC: Recent Labs  Lab 10/05/20 1648 10/06/20 0025 10/06/20 0532 10/06/20 1649 10/07/20 0505 10/08/20 0420 10/09/20 0541 10/10/20 0417  WBC 3.7*   < > 4.2  --  4.1 4.0 6.1 4.4  NEUTROABS 1.4*  --   --   --  3.1  --   --   --   HGB 12.2   < > 9.3* 9.5* 9.7* 9.0* 10.5* 9.9*  HCT 37.0   < > 28.5* 29.7* 28.6* 27.6* 33.3* 31.2*  MCV 103.6*   < > 104.4*  --  104.8* 105.3* 107.4* 107.2*  PLT 184   < > 155  --  189 174 323 210   < > = values in this interval not displayed.   Basic Metabolic Panel: Recent Labs  Lab 10/06/20 0532 10/07/20 0505 10/08/20 0420 10/09/20 0541 10/10/20 0417  NA 139 134* 135 136 134*  K 3.9 3.6 3.6 3.4* 3.3*  CL 104 103 105 103 103  CO2 25 22 20* 20* 22  GLUCOSE 87 83 72 179* 75  BUN <5* <5* <5* <5* <5*  CREATININE 0.44 0.47 0.54 0.64 0.47  CALCIUM 7.9* 7.8* 7.9* 8.9 8.3*  MG 1.4* 1.9 1.8 1.8 1.7   GFR: Estimated Creatinine Clearance: 83.7 mL/min (by C-G formula based on SCr of 0.47 mg/dL). Liver Function Tests: Recent Labs  Lab 10/06/20 0532 10/07/20 0505 10/08/20 0420 10/09/20 0541 10/10/20 0417  AST 466* 227* 184* 172* 135*  ALT 119* 85* 78* 91* 70*  ALKPHOS 270* 216* 210* 266* 212*  BILITOT 0.9 0.9 1.0 0.8 0.8  PROT 5.2* 4.6* 5.1* 7.0 5.8*  ALBUMIN 2.4* 2.2* 2.3* 3.1* 2.6*   No results for input(s): LIPASE, AMYLASE in the last 168 hours. Recent Labs  Lab 10/05/20 1657  10/08/20 1855  AMMONIA 40* 25   Coagulation Profile: Recent Labs  Lab 10/05/20 1648 10/07/20 0505  INR 1.0 1.1   Cardiac Enzymes: No results for input(s): CKTOTAL, CKMB, CKMBINDEX, TROPONINI in the last 168 hours. BNP (last 3 results) No results for input(s): PROBNP in the last 8760 hours. HbA1C: No results for input(s): HGBA1C in the last 72 hours. CBG: No results for input(s): GLUCAP in the last 168 hours. Lipid Profile: No results for input(s): CHOL, HDL, LDLCALC, TRIG, CHOLHDL, LDLDIRECT in the last 72 hours. Thyroid Function Tests: No results for input(s): TSH, T4TOTAL, FREET4, T3FREE, THYROIDAB in the last 72 hours. Anemia Panel: No results for input(s): VITAMINB12, FOLATE, FERRITIN, TIBC, IRON, RETICCTPCT in the last 72 hours. Sepsis Labs: No results for input(s): PROCALCITON, LATICACIDVEN in the last 168 hours.  Recent Results (from the past 240 hour(s))  Resp Panel by RT-PCR (Flu A&B, Covid) Nasopharyngeal Swab     Status: None   Collection Time: 10/05/20  9:14 PM   Specimen: Nasopharyngeal Swab; Nasopharyngeal(NP) swabs in vial transport medium  Result Value Ref Range Status   SARS Coronavirus 2  by RT PCR NEGATIVE NEGATIVE Final    Comment: (NOTE) SARS-CoV-2 target nucleic acids are NOT DETECTED.  The SARS-CoV-2 RNA is generally detectable in upper respiratory specimens during the acute phase of infection. The lowest concentration of SARS-CoV-2 viral copies this assay can detect is 138 copies/mL. A negative result does not preclude SARS-Cov-2 infection and should not be used as the sole basis for treatment or other patient management decisions. A negative result may occur with  improper specimen collection/handling, submission of specimen other than nasopharyngeal swab, presence of viral mutation(s) within the areas targeted by this assay, and inadequate number of viral copies(<138 copies/mL). A negative result must be combined with clinical observations,  patient history, and epidemiological information. The expected result is Negative.  Fact Sheet for Patients:  EntrepreneurPulse.com.au  Fact Sheet for Healthcare Providers:  IncredibleEmployment.be  This test is no t yet approved or cleared by the [place] FDA and  has been authorized for detection and/or diagnosis of SARS-CoV-2 by FDA under an Emergency Use Authorization (EUA). This EUA will remain  in effect (meaning this test can be used) for the duration of the COVID-19 declaration under Section 564(b)(1) of the Act, 21 U.S.C.section 360bbb-3(b)(1), unless the authorization is terminated  or revoked sooner.       Influenza A by PCR NEGATIVE NEGATIVE Final   Influenza B by PCR NEGATIVE NEGATIVE Final    Comment: (NOTE) The Xpert Xpress SARS-CoV-2/FLU/RSV plus assay is intended as an aid in the diagnosis of influenza from Nasopharyngeal swab specimens and should not be used as a sole basis for treatment. Nasal washings and aspirates are unacceptable for Xpert Xpress SARS-CoV-2/FLU/RSV testing.  Fact Sheet for Patients: EntrepreneurPulse.com.au  Fact Sheet for Healthcare Providers: IncredibleEmployment.be  This test is not yet approved or cleared by the [place] FDA and has been authorized for detection and/or diagnosis of SARS-CoV-2 by FDA under an Emergency Use Authorization (EUA). This EUA will remain in effect (meaning this test can be used) for the duration of the COVID-19 declaration under Section 564(b)(1) of the Act, 21 U.S.C. section 360bbb-3(b)(1), unless the authorization is terminated or revoked.  Performed at [facility], [address] [place], [place] [address]   C Difficile Quick Screen (NO PCR Reflex)     Status: None   Collection Time: 10/08/20 10:15 AM   Specimen: STOOL  Result Value Ref Range Status   C Diff antigen NEGATIVE NEGATIVE Final   C Diff toxin NEGATIVE  NEGATIVE Final   C Diff interpretation No C. difficile detected.  Final    Comment: Performed at [facility], [address] [place], [place] [address]  Culture, blood (routine x 2)     Status: None (Preliminary result)   Collection Time: 10/09/20  8:49 AM   Specimen: BLOOD  Result Value Ref Range Status   Specimen Description BLOOD BLOOD LEFT ARM  Final   Special Requests   Final    BOTTLES DRAWN AEROBIC AND ANAEROBIC Blood Culture adequate volume   Culture   Final    NO GROWTH < 24 HOURS Performed at [facility], [address] [place],  [address]    Report Status PENDING  Incomplete  Culture, blood (routine x 2)     Status: None (Preliminary result)   Collection Time: 10/09/20  8:49 AM   Specimen: BLOOD RIGHT HAND  Result Value Ref Range Status   Specimen Description BLOOD RIGHT HAND  Final   Special Requests   Final    BOTTLES  DRAWN AEROBIC ONLY Blood Culture results may not be optimal due to an inadequate volume of blood received in culture bottles   Culture   Final    NO GROWTH < 24 HOURS Performed at [facility], [address], [place], [place] [address]    Report Status PENDING  Incomplete  MRSA PCR Screening     Status: None   Collection Time: 10/09/20  9:03 PM   Specimen: Nasopharyngeal  Result Value Ref Range Status   MRSA by PCR NEGATIVE NEGATIVE Final    Comment:        The GeneXpert MRSA Assay (FDA approved for NASAL specimens only), is one component of a comprehensive MRSA colonization surveillance program. It is not intended to diagnose MRSA infection nor to guide or monitor treatment for MRSA infections. Performed at [facility], [address] [place], [place] [address]          Radiology Studies: No results found.      Scheduled Meds: . chlordiazePOXIDE  25 mg Oral TID  . Chlorhexidine Gluconate Cloth  6 each Topical Daily  . famotidine  20 mg Oral BID  . ferrous sulfate  325 mg Oral BID WC  . folic acid  1 mg Oral Daily  .  hydrocerin   Topical TID  . melatonin  3 mg Oral QHS  . multivitamin with minerals  1 tablet Oral Daily  . pantoprazole (PROTONIX) IV  40 mg Intravenous Q12H  . potassium chloride  40 mEq Oral BID  . thiamine  100 mg Oral Daily   Or  . thiamine  100 mg Intravenous Daily    LOS: 4 days    Time spent: 35 minutes    Pratik Darleen Crocker, DO [facility]  If 7PM-7AM, please contact night-coverage [email or web address] 10/10/2020, 10:45 AM

## 2020-10-11 ENCOUNTER — Telehealth: Payer: Self-pay | Admitting: Gastroenterology

## 2020-10-11 LAB — GASTROINTESTINAL PANEL BY PCR, STOOL (REPLACES STOOL CULTURE)

## 2020-10-11 LAB — COMPREHENSIVE METABOLIC PANEL
ALT: 63 U/L — ABNORMAL HIGH (ref 0–44)
AST: 133 U/L — ABNORMAL HIGH (ref 15–41)
Albumin: 2.5 g/dL — ABNORMAL LOW (ref 3.5–5.0)
Alkaline Phosphatase: 197 U/L — ABNORMAL HIGH (ref 38–126)
Anion gap: 10 (ref 5–15)
BUN: <5 mg/dL — ABNORMAL LOW (ref 6–20)
CO2: 23 mmol/L (ref 22–32)
Calcium: 8.6 mg/dL — ABNORMAL LOW (ref 8.9–10.3)
Chloride: 104 mmol/L (ref 98–111)
Creatinine, Ser: 0.64 mg/dL (ref 0.44–1.00)
GFR, Estimated: >60 mL/min (ref >60)
Glucose, Bld: 72 mg/dL (ref 70–99)
Potassium: 3.7 mmol/L (ref 3.5–5.1)
Sodium: 137 mmol/L (ref 135–145)
Total Bilirubin: 0.7 mg/dL (ref 0.3–1.2)
Total Protein: 5.6 g/dL — ABNORMAL LOW (ref 6.5–8.1)

## 2020-10-11 LAB — CBC
HCT: 33.6 % — ABNORMAL LOW (ref 36.0–46.0)
Hemoglobin: 10.7 g/dL — ABNORMAL LOW (ref 12.0–15.0)
MCH: 34.2 pg — ABNORMAL HIGH (ref 26.0–34.0)
MCHC: 31.8 g/dL (ref 30.0–36.0)
MCV: 107.3 fL — ABNORMAL HIGH (ref 80.0–100.0)
Platelets: 271 10*3/uL (ref 150–400)
RBC: 3.13 MIL/uL — ABNORMAL LOW (ref 3.87–5.11)
RDW: 13.2 % (ref 11.5–15.5)
WBC: 4.3 10*3/uL (ref 4.0–10.5)
nRBC: 0 % (ref 0.0–0.2)

## 2020-10-11 LAB — MAGNESIUM: Magnesium: 1.8 mg/dL (ref 1.7–2.4)

## 2020-10-11 MED ORDER — CHLORDIAZEPOXIDE HCL 5 MG PO CAPS
5.0000 mg | ORAL_CAPSULE | Freq: Three times a day (TID) | ORAL | Status: DC
  Administered 2020-10-11: 5 mg via ORAL

## 2020-10-11 MED ORDER — HYDROXYZINE HCL 25 MG PO TABS: 25.0000 mg | ORAL_TABLET | Freq: Three times a day (TID) | ORAL | 0 refills | Status: DC | PRN

## 2020-10-11 MED ORDER — PANTOPRAZOLE SODIUM 40 MG PO TBEC: 40.0000 mg | DELAYED_RELEASE_TABLET | Freq: Every day | ORAL | 1 refills | Status: DC

## 2020-10-11 MED ORDER — CHLORDIAZEPOXIDE HCL 5 MG PO CAPS: ORAL_CAPSULE | ORAL | 0 refills | Status: AC

## 2020-10-11 MED ORDER — EUCERIN EX CREA: TOPICAL_CREAM | CUTANEOUS | 0 refills | Status: DC | PRN

## 2020-10-11 NOTE — Progress Notes (Signed)
    Subjective: Mild LLQ discomfort at site of ostomy. No overt GI bleeding. No N/V. Tolerating diet. Alert and oriented. Walked around nurses station this morning. Plans for discharge today.   Objective: Vital signs in last 24 hours: Temp:  [98.4 F (36.9 C)-99.2 F (37.3 C)] 98.4 F (36.9 C) (03/28 0700) Pulse Rate:  [82-93] 88 (03/28 0800) Resp:  [16-41] 16 (03/28 0800) BP: (86-134)/(57-104) 113/90 (03/28 0800) SpO2:  [94 %-100 %] 100 % (03/28 0800) Last BM Date: 10/11/20 General:   Alert and oriented, pleasant Head:  Normocephalic and atraumatic. Abdomen:  Bowel sounds present, soft, mild TTP LLQ below ostomy site, left-sided parastomal hernia. Non-distended. No rebound or guarding Neurologic:  Alert and  oriented x4;  grossly normal neurologically. Psych:  Alert and cooperative. Normal mood and affect.  Intake/Output from previous day: 03/27 0701 - 03/28 0700 In: 240 [P.O.:240] Out: 700 [Urine:200; Stool:500] Intake/Output this shift: No intake/output data recorded.  Lab Results: Recent Labs    10/09/20 0541 10/10/20 0417 10/11/20 0441  WBC 6.1 4.4 4.3  HGB 10.5* 9.9* 10.7*  HCT 33.3* 31.2* 33.6*  PLT 323 210 271   BMET Recent Labs    10/09/20 0541 10/10/20 0417 10/11/20 0441  NA 136 134* 137  K 3.4* 3.3* 3.7  CL 103 103 104  CO2 20* 22 23  GLUCOSE 179* 75 72  BUN <5* <5* <5*  CREATININE 0.64 0.47 0.64  CALCIUM 8.9 8.3* 8.6*   LFT Recent Labs    10/09/20 0541 10/10/20 0417 10/11/20 0441  PROT 7.0 5.8* 5.6*  ALBUMIN 3.1* 2.6* 2.5*  AST 172* 135* 133*  ALT 91* 70* 63*  ALKPHOS 266* 212* 197*  BILITOT 0.8 0.8 0.7    Assessment: 50 year old female with history of necrotizing fasciitis s/p diverting colostomy June 2021, history of ETOH abuse, presenting with acute GI bleed. CT on admission with pancolitis, large parastomal hernia. EGD and colonoscopy through ostomy/proctoscopy completed. EGD with non-bleeding gastric ulcer and colonoscopy with  single healing rectal ulcer in distal rectum with surrounding mucosal friability and markedly abnormal proximal colon with ulceration s/p biopsies. Query ischemic vs NSAIDs vs parastomal hernia playing a role.   Acute blood loss anemia: Hgb improved to 10.7. No transfusion needed. No overt GI bleeding.   Abnormal LFTs: negative acute hepatitis panel. multifactorial in setting of ETOH use, fatty liver, likely ischemic injury. Continues to improve.   Pancolitis: Cdiff negative. Colonoscopy findings as above. GI pathogen panel still in process.   Overall, clinically improved since admission and will need surveillance EGD as outpatient. Repeat colonoscopy prior to ostomy takedown can be coordinated with surgeon Dr. Marcello Moores when appropriate.   Plans for discharge home today. Appears she was initially seen by GI in [facility] while inpatient last year but desires to stay locally as this is closer.     Plan: PPI BID EGD in 3 months for surveillance ulcer healing Follow-up on pathology as comes available Will repeat HFP at end of week Will arrange outpatient follow-up Discharge today   Annitta Needs, PhD, ANP-BC [facility]     LOS: 5 days    10/11/2020, 8:26 AM

## 2020-10-11 NOTE — Discharge Summary (Signed)
Physician Discharge Summary  MYRA WENG OZY:[id number] DOB: 1971-02-08 DOA: 10/05/2020  PCP: [facility]  Admit date: 10/05/2020  Discharge date: 10/11/2020  Admitted From:Home  Disposition:  Home  Recommendations for Outpatient Follow-up:  1. Follow up with PCP in 1-2 weeks 2. Follow-up with GI in the outpatient setting with referral sent and follow-up hemoglobin and liver function tests 3. Continue on Protonix daily 4. Continue small Librium taper 5. Continue other home medications as listed below 6. Counseled on alcohol cessation with resources provided.  Home Health: None  Equipment/Devices: Has walker as needed  Discharge Condition:Stable  CODE STATUS: Full  Diet recommendation: Heart Healthy  Brief/Interim Summary:  [name],with history of alcohol dependence, necrotizing fasciitis requiring colostomy, hypertension, and more presents to the ED with a chief complaint of blood in the ostomy bag. Patient was hospitalized in June 2021 for necrotizing fasciitis that required diverting colostomy.Patient has been admitted for evaluation of acute GI bleed and is also noted to have transaminitis.She has undergone EGD and colonoscopy on 3/24.  EGD revealed a small superficial healing antral ulcer without stigmata of bleeding and colonoscopy revealed scattered ulcers throughout the colon.  Stool GI pathogen panel is still pending, but bleeding has stopped and she has not required any blood transfusion.  She is tolerating diet and hemoglobin levels have remained stable.  She was noted to have a mild transaminitis with hepatitis panel noted to be negative and ultrasound of the liver with no acute findings aside from fatty liver.  It is thought that she may have had some shock liver due to low blood pressure readings while hospitalized that may have contributed to this.  She did develop some alcoholic withdrawal symptoms and hallucinosis that  required placement in stepdown unit for couple days on Librium as well as CIWA protocol.  She continues to drink on a fairly regular basis and has been counseled on cessation.  She is currently at baseline and is overall stable for discharge and will need to follow-up with GI in the outpatient setting.  Discharge Diagnoses:  Principal Problem:   Acute GI bleeding Active Problems:   Transaminitis   HTN (hypertension)   Mild protein malnutrition (HCC)   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CC)   Elevated alkaline phosphatase level   Abdominal pain   Parastomal hernia without obstruction or gangrene   Acute blood loss anemia  Principal discharge diagnosis: Acute GI bleed with mild transaminitis.  Alcohol withdrawal.  Discharge Instructions  Discharge Instructions    Ambulatory referral to Gastroenterology   Complete by: As directed    Hospital follow up, GI bleed.   Diet - low sodium heart healthy   Complete by: As directed    Increase activity slowly   Complete by: As directed      Allergies as of 10/11/2020      Reactions   Penicillins    Did it involve swelling of the face/tongue/throat, SOB, or low BP? N Did it involve sudden or severe rash/hives, skin peeling, or any reaction on the inside of your mouth or nose? Y Did you need to seek medical attention at a hospital or doctor's office? N When did it last happen?Childhood If all above answers are "NO", may proceed with cephalosporin use.   Oxycodone Hcl Rash   Rash to oxycodone      Medication List    STOP taking these medications   diphenhydramine-acetaminophen 25-500 MG Tabs tablet Commonly known as: TYLENOL PM   melatonin 3 MG Tabs tablet  metoprolol tartrate 25 MG tablet Commonly known as: LOPRESSOR   oxyCODONE 5 MG immediate release tablet Commonly known as: Oxy IR/ROXICODONE   traZODone 100 MG tablet Commonly known as: DESYREL     TAKE these medications   acetaminophen 325 MG tablet Commonly known as: TYLENOL Take  650 mg by mouth every 6 (six) hours as needed.   ascorbic acid 500 MG tablet Commonly known as: VITAMIN C Take 1 tablet (500 mg total) by mouth 2 (two) times daily.   chlordiazePOXIDE 5 MG capsule Commonly known as: LIBRIUM Take 1 capsule (5 mg total) by mouth 3 (three) times daily for 2 days, THEN 1 capsule (5 mg total) in the morning and at bedtime for 2 days, THEN 1 capsule (5 mg total) daily for 2 days. Start taking on: October 11, 2020   collagenase ointment Commonly known as: SANTYL Apply topically daily.   feeding supplement (PRO-STAT SUGAR FREE 64) Liqd Take 30 mLs by mouth 2 (two) times daily between meals.   Ferrous Sulfate 27 MG Tabs Take 11.1111 tablets (300 mg total) by mouth 2 (two) times daily with a meal.   folic acid 1 MG tablet Commonly known as: FOLVITE Take 1 tablet (1 mg total) by mouth daily.   hydrOXYzine 25 MG tablet Commonly known as: ATARAX/VISTARIL Take 1 tablet (25 mg total) by mouth 3 (three) times daily as needed for itching.   ibuprofen 600 MG tablet Commonly known as: ADVIL Take 600 mg by mouth every 6 (six) hours as needed. What changed: Another medication with the same name was removed. Continue taking this medication, and follow the directions you see here.   mineral oil-hydrophilic petrolatum ointment Apply topically 3 (three) times daily.   multivitamin with minerals Tabs tablet Take 1 tablet by mouth daily.   nutrition supplement (JUVEN) Pack Take 1 packet by mouth 2 (two) times daily between meals.   feeding supplement Liqd Take 237 mLs by mouth 2 (two) times daily between meals.   pantoprazole 40 MG tablet Commonly known as: Protonix Take 1 tablet (40 mg total) by mouth daily.   thiamine 100 MG tablet Take 1 tablet (100 mg total) by mouth daily.       Follow-up Information    [facility]. Schedule an appointment as soon as possible for a visit in 1 week(s).   Contact information: [employer]  [address] [phone]        [facility] Follow up in 2 week(s).   Contact information: [place] [phone]             Allergies  Allergen Reactions  . Penicillins     Did it involve swelling of the face/tongue/throat, SOB, or low BP? N Did it involve sudden or severe rash/hives, skin peeling, or any reaction on the inside of your mouth or nose? Y Did you need to seek medical attention at a hospital or doctor's office? N When did it last happen?Childhood If all above answers are "NO", may proceed with cephalosporin use.   Marland Kitchen Oxycodone Hcl Rash    Rash to oxycodone    Consultations:  GI   Procedures/Studies: CT ABDOMEN PELVIS W CONTRAST  Result Date: 10/05/2020 CLINICAL DATA:  50 year old female with GI bleed. EXAM: CT ABDOMEN AND PELVIS WITH CONTRAST TECHNIQUE: Multidetector CT imaging of the abdomen and pelvis was performed using the standard protocol following bolus administration of intravenous contrast. CONTRAST:  159m OMNIPAQUE IOHEXOL 300 MG/ML  SOLN COMPARISON:  None FINDINGS: Lower chest: The visualized lung bases are clear. Borderline cardiomegaly. No intra-abdominal free air or free fluid. Hepatobiliary: Diffuse fatty liver. No intrahepatic biliary dilatation. The gallbladder is unremarkable. Pancreas: Unremarkable. No pancreatic ductal dilatation or surrounding inflammatory changes. Spleen: Normal in size without focal abnormality. Adrenals/Urinary Tract: The adrenal glands unremarkable there is no hydronephrosis on either side. There is symmetric enhancement and excretion of contrast by both kidneys. The visualized ureters appear unremarkable. The urinary bladder is partially distended and grossly unremarkable. Stomach/Bowel: There is a left lower quadrant loop colostomy. There is a large parastomal hernia containing multiple loops of small bowel without evidence of obstruction. There is diffuse  inflammatory changes and thickening of the colon consistent with pancolitis. Loose stool noted within the colon consistent with diarrheal state. Correlation with clinical exam and stool cultures recommended. There is no bowel obstruction. The appendix is normal. Vascular/Lymphatic: The abdominal aorta and IVC are unremarkable. No portal venous gas. There is no adenopathy. Reproductive: The uterus is anteverted and grossly unremarkable. No adnexal masses. Other: Midline vertical anterior abdominal wall incisional scar. Musculoskeletal: Mild degenerative changes of the spine. Mild degenerative changes of the hips. No acute osseous pathology. IMPRESSION: 1. Pancolitis and diarrheal state. Correlation with clinical exam and stool cultures recommended. No bowel obstruction. Normal appendix. 2. Left lower quadrant loop colostomy with large parastomal hernia without evidence of obstruction. 3. Fatty liver. Electronically Signed   By: Anner Crete M.D.   On: 10/05/2020 20:41   US Abdomen Limited  Result Date: 10/06/2020 CLINICAL DATA:  Transaminitis EXAM: ULTRASOUND ABDOMEN LIMITED RIGHT UPPER QUADRANT COMPARISON:  Abdominal CT from yesterday FINDINGS: Gallbladder: No gallstones or wall thickening visualized. No sonographic Murphy sign noted by sonographer. Common bile duct: Diameter: 6 mm.  Where visualized, no filling defect. Liver: Echogenic liver. No focal mass is seen. Portal vein is patent on color Doppler imaging with normal direction of blood flow towards the liver. IMPRESSION: Hepatic steatosis. Electronically Signed   By: Monte Fantasia M.D.   On: 10/06/2020 11:24     Discharge Exam: Vitals:   10/11/20 0800 10/11/20 0909  BP: 113/90 (!) 82/59  Pulse: 88 97  Resp: 16 (!) 30  Temp:    SpO2: 100% 100%   Vitals:   10/11/20 0600 10/11/20 0700 10/11/20 0800 10/11/20 0909  BP: 114/75 106/78 113/90 (!) 82/59  Pulse: 87 84 88 97  Resp: (!) 41 (!) 34 16 (!) 30  Temp:  98.4 F (36.9 C)     TempSrc:  Oral    SpO2: 100% 100% 100% 100%  Weight:      Height:        General: Pt is alert, awake, not in acute distress Cardiovascular: RRR, S1/S2 +, no rubs, no gallops Respiratory: CTA bilaterally, no wheezing, no rhonchi Abdominal: Soft, NT, ND, bowel sounds + Extremities: no edema, no cyanosis    The results of significant diagnostics from this hospitalization (including imaging, microbiology, ancillary and laboratory) are listed below for reference.     Microbiology: Recent Results (from the past 240 hour(s))  Resp Panel by RT-PCR (Flu A&B, Covid) Nasopharyngeal Swab     Status: None   Collection Time: 10/05/20  9:14 PM   Specimen: Nasopharyngeal Swab; Nasopharyngeal(NP) swabs in vial transport medium  Result Value Ref Range Status   SARS Coronavirus 2 by RT PCR NEGATIVE NEGATIVE Final    Comment: (NOTE) SARS-CoV-2 target nucleic acids are NOT DETECTED.  The SARS-CoV-2 RNA is generally detectable  in upper respiratory specimens during the acute phase of infection. The lowest concentration of SARS-CoV-2 viral copies this assay can detect is 138 copies/mL. A negative result does not preclude SARS-Cov-2 infection and should not be used as the sole basis for treatment or other patient management decisions. A negative result may occur with  improper specimen collection/handling, submission of specimen other than nasopharyngeal swab, presence of viral mutation(s) within the areas targeted by this assay, and inadequate number of viral copies(<138 copies/mL). A negative result must be combined with clinical observations, patient history, and epidemiological information. The expected result is Negative.  Fact Sheet for Patients:  EntrepreneurPulse.com.au  Fact Sheet for Healthcare Providers:  IncredibleEmployment.be  This test is no t yet approved or cleared by the [place] FDA and  has been authorized for detection and/or  diagnosis of SARS-CoV-2 by FDA under an Emergency Use Authorization (EUA). This EUA will remain  in effect (meaning this test can be used) for the duration of the COVID-19 declaration under Section 564(b)(1) of the Act, 21 U.S.C.section 360bbb-3(b)(1), unless the authorization is terminated  or revoked sooner.       Influenza A by PCR NEGATIVE NEGATIVE Final   Influenza B by PCR NEGATIVE NEGATIVE Final    Comment: (NOTE) The Xpert Xpress SARS-CoV-2/FLU/RSV plus assay is intended as an aid in the diagnosis of influenza from Nasopharyngeal swab specimens and should not be used as a sole basis for treatment. Nasal washings and aspirates are unacceptable for Xpert Xpress SARS-CoV-2/FLU/RSV testing.  Fact Sheet for Patients: EntrepreneurPulse.com.au  Fact Sheet for Healthcare Providers: IncredibleEmployment.be  This test is not yet approved or cleared by the [place] FDA and has been authorized for detection and/or diagnosis of SARS-CoV-2 by FDA under an Emergency Use Authorization (EUA). This EUA will remain in effect (meaning this test can be used) for the duration of the COVID-19 declaration under Section 564(b)(1) of the Act, 21 U.S.C. section 360bbb-3(b)(1), unless the authorization is terminated or revoked.  Performed at [facility], [address] [place], [place] [address]   C Difficile Quick Screen (NO PCR Reflex)     Status: None   Collection Time: 10/08/20 10:15 AM   Specimen: STOOL  Result Value Ref Range Status   C Diff antigen NEGATIVE NEGATIVE Final   C Diff toxin NEGATIVE NEGATIVE Final   C Diff interpretation No C. difficile detected.  Final    Comment: Performed at [facility], [address], [place], [place] [address]  Culture, blood (routine x 2)     Status: None (Preliminary result)   Collection Time: 10/09/20  8:49 AM   Specimen: BLOOD  Result Value Ref Range Status   Specimen Description BLOOD BLOOD LEFT ARM   Final   Special Requests   Final    BOTTLES DRAWN AEROBIC AND ANAEROBIC Blood Culture adequate volume   Culture   Final    NO GROWTH 2 DAYS Performed at [facility], [address] [place], [place] [address]    Report Status PENDING  Incomplete  Culture, blood (routine x 2)     Status: None (Preliminary result)   Collection Time: 10/09/20  8:49 AM   Specimen: BLOOD RIGHT HAND  Result Value Ref Range Status   Specimen Description BLOOD RIGHT HAND  Final   Special Requests   Final    BOTTLES DRAWN AEROBIC ONLY Blood Culture results may not be optimal due to an inadequate volume of blood received in culture bottles   Culture  Final    NO GROWTH 2 DAYS Performed at [facility], [address] [place], [place] [address]    Report Status PENDING  Incomplete  MRSA PCR Screening     Status: None   Collection Time: 10/09/20  9:03 PM   Specimen: Nasopharyngeal  Result Value Ref Range Status   MRSA by PCR NEGATIVE NEGATIVE Final    Comment:        The GeneXpert MRSA Assay (FDA approved for NASAL specimens only), is one component of a comprehensive MRSA colonization surveillance program. It is not intended to diagnose MRSA infection nor to guide or monitor treatment for MRSA infections. Performed at [facility], [address], [place],  [address]      Labs: BNP (last 3 results) No results for input(s): BNP in the last 8760 hours. Basic Metabolic Panel: Recent Labs  Lab 10/07/20 0505 10/08/20 0420 10/09/20 0541 10/10/20 0417 10/11/20 0441  NA 134* 135 136 134* 137  K 3.6 3.6 3.4* 3.3* 3.7  CL 103 105 103 103 104  CO2 22 20* 20* 22 23  GLUCOSE 83 72 179* 75 72  BUN <5* <5* <5* <5* <5*  CREATININE 0.47 0.54 0.64 0.47 0.64  CALCIUM 7.8* 7.9* 8.9 8.3* 8.6*  MG 1.9 1.8 1.8 1.7 1.8   Liver Function Tests: Recent Labs  Lab 10/07/20 0505 10/08/20 0420 10/09/20 0541 10/10/20 0417 10/11/20 0441  AST 227* 184* 172* 135* 133*  ALT 85* 78* 91* 70* 63*  ALKPHOS  216* 210* 266* 212* 197*  BILITOT 0.9 1.0 0.8 0.8 0.7  PROT 4.6* 5.1* 7.0 5.8* 5.6*  ALBUMIN 2.2* 2.3* 3.1* 2.6* 2.5*   No results for input(s): LIPASE, AMYLASE in the last 168 hours. Recent Labs  Lab 10/05/20 1657 10/08/20 1855  AMMONIA 40* 25   CBC: Recent Labs  Lab 10/05/20 1648 10/06/20 0025 10/07/20 0505 10/08/20 0420 10/09/20 0541 10/10/20 0417 10/11/20 0441  WBC 3.7*   < > 4.1 4.0 6.1 4.4 4.3  NEUTROABS 1.4*  --  3.1  --   --   --   --   HGB 12.2   < > 9.7* 9.0* 10.5* 9.9* 10.7*  HCT 37.0   < > 28.6* 27.6* 33.3* 31.2* 33.6*  MCV 103.6*   < > 104.8* 105.3* 107.4* 107.2* 107.3*  PLT 184   < > 189 174 323 210 271   < > = values in this interval not displayed.   Cardiac Enzymes: No results for input(s): CKTOTAL, CKMB, CKMBINDEX, TROPONINI in the last 168 hours. BNP: Invalid input(s): POCBNP CBG: No results for input(s): GLUCAP in the last 168 hours. D-Dimer No results for input(s): DDIMER in the last 72 hours. Hgb A1c No results for input(s): HGBA1C in the last 72 hours. Lipid Profile No results for input(s): CHOL, HDL, LDLCALC, TRIG, CHOLHDL, LDLDIRECT in the last 72 hours. Thyroid function studies No results for input(s): TSH, T4TOTAL, T3FREE, THYROIDAB in the last 72 hours.  Invalid input(s): FREET3 Anemia work up No results for input(s): VITAMINB12, FOLATE, FERRITIN, TIBC, IRON, RETICCTPCT in the last 72 hours. Urinalysis    Component Value Date/Time   COLORURINE AMBER (A) 01/11/2020 0901   APPEARANCEUR HAZY (A) 01/11/2020 0901   LABSPEC 1.023 01/11/2020 0901   PHURINE 5.0 01/11/2020 0901   GLUCOSEU NEGATIVE 01/11/2020 0901   HGBUR NEGATIVE 01/11/2020 0901   BILIRUBINUR NEGATIVE 01/11/2020 0901   KETONESUR NEGATIVE 01/11/2020 0901   PROTEINUR 30 (A) 01/11/2020 0901   NITRITE NEGATIVE 01/11/2020 0901  LEUKOCYTESUR SMALL (A) 01/11/2020 0901   Sepsis Labs Invalid input(s): PROCALCITONIN,  WBC,  LACTICIDVEN Microbiology Recent Results (from the  past 240 hour(s))  Resp Panel by RT-PCR (Flu A&B, Covid) Nasopharyngeal Swab     Status: None   Collection Time: 10/05/20  9:14 PM   Specimen: Nasopharyngeal Swab; Nasopharyngeal(NP) swabs in vial transport medium  Result Value Ref Range Status   SARS Coronavirus 2 by RT PCR NEGATIVE NEGATIVE Final    Comment: (NOTE) SARS-CoV-2 target nucleic acids are NOT DETECTED.  The SARS-CoV-2 RNA is generally detectable in upper respiratory specimens during the acute phase of infection. The lowest concentration of SARS-CoV-2 viral copies this assay can detect is 138 copies/mL. A negative result does not preclude SARS-Cov-2 infection and should not be used as the sole basis for treatment or other patient management decisions. A negative result may occur with  improper specimen collection/handling, submission of specimen other than nasopharyngeal swab, presence of viral mutation(s) within the areas targeted by this assay, and inadequate number of viral copies(<138 copies/mL). A negative result must be combined with clinical observations, patient history, and epidemiological information. The expected result is Negative.  Fact Sheet for Patients:  EntrepreneurPulse.com.au  Fact Sheet for Healthcare Providers:  IncredibleEmployment.be  This test is no t yet approved or cleared by the [place] FDA and  has been authorized for detection and/or diagnosis of SARS-CoV-2 by FDA under an Emergency Use Authorization (EUA). This EUA will remain  in effect (meaning this test can be used) for the duration of the COVID-19 declaration under Section 564(b)(1) of the Act, 21 U.S.C.section 360bbb-3(b)(1), unless the authorization is terminated  or revoked sooner.       Influenza A by PCR NEGATIVE NEGATIVE Final   Influenza B by PCR NEGATIVE NEGATIVE Final    Comment: (NOTE) The Xpert Xpress SARS-CoV-2/FLU/RSV plus assay is intended as an aid in the diagnosis of  influenza from Nasopharyngeal swab specimens and should not be used as a sole basis for treatment. Nasal washings and aspirates are unacceptable for Xpert Xpress SARS-CoV-2/FLU/RSV testing.  Fact Sheet for Patients: EntrepreneurPulse.com.au  Fact Sheet for Healthcare Providers: IncredibleEmployment.be  This test is not yet approved or cleared by the [place] FDA and has been authorized for detection and/or diagnosis of SARS-CoV-2 by FDA under an Emergency Use Authorization (EUA). This EUA will remain in effect (meaning this test can be used) for the duration of the COVID-19 declaration under Section 564(b)(1) of the Act, 21 U.S.C. section 360bbb-3(b)(1), unless the authorization is terminated or revoked.  Performed at [facility], [address], [place], [place] [address]   C Difficile Quick Screen (NO PCR Reflex)     Status: None   Collection Time: 10/08/20 10:15 AM   Specimen: STOOL  Result Value Ref Range Status   C Diff antigen NEGATIVE NEGATIVE Final   C Diff toxin NEGATIVE NEGATIVE Final   C Diff interpretation No C. difficile detected.  Final    Comment: Performed at [facility], [address], [place], [place] [address]  Culture, blood (routine x 2)     Status: None (Preliminary result)   Collection Time: 10/09/20  8:49 AM   Specimen: BLOOD  Result Value Ref Range Status   Specimen Description BLOOD BLOOD LEFT ARM  Final   Special Requests   Final    BOTTLES DRAWN AEROBIC AND ANAEROBIC Blood Culture adequate volume   Culture   Final    NO GROWTH 2 DAYS Performed at [facility], [address], [place], [place] [address]    Report Status PENDING  Incomplete  Culture, blood (routine x 2)     Status: None (Preliminary result)   Collection Time: 10/09/20  8:49 AM   Specimen: BLOOD RIGHT HAND  Result Value Ref Range Status   Specimen Description BLOOD RIGHT HAND  Final   Special Requests   Final    BOTTLES DRAWN AEROBIC ONLY  Blood Culture results may not be optimal due to an inadequate volume of blood received in culture bottles   Culture   Final    NO GROWTH 2 DAYS Performed at [facility], [address], [place], [place] [address]    Report Status PENDING  Incomplete  MRSA PCR Screening     Status: None   Collection Time: 10/09/20  9:03 PM   Specimen: Nasopharyngeal  Result Value Ref Range Status   MRSA by PCR NEGATIVE NEGATIVE Final    Comment:        The GeneXpert MRSA Assay (FDA approved for NASAL specimens only), is one component of a comprehensive MRSA colonization surveillance program. It is not intended to diagnose MRSA infection nor to guide or monitor treatment for MRSA infections. Performed at [facility], [address], [place], [place] [address]      Time coordinating discharge: 35 minutes  SIGNED:   Rodena Goldmann, DO [facility] 10/11/2020, 9:32 AM  If 7PM-7AM, please contact night-coverage [email or web address]

## 2020-10-11 NOTE — Telephone Encounter (Signed)
GI pathogen panel returned after patient was discharged. Positive for enterotoxigenic E.coli. Clinically, she was doing well today. Will hold off on antibiotics at this time.  Can we call patient tomorrow and find out how she is doing?    Alyssa Carey, please have patient return in 4 weeks for hospital follow-up.

## 2020-10-11 NOTE — Progress Notes (Signed)
Post Discharge Critical Called---  Enterotoxigenic E coli (ETEC): POSITIVE

## 2020-10-11 NOTE — Progress Notes (Signed)
Ambulated patient around unit twice. Standby assist. Slightly weak and wobbly at times but required no assistive devices. HR max 115 during ambulation. O2 sat 100% on room air. BP WNL. Ate most of her breakfast and ambulated independently to bathroom. Dr. Manuella Ghazi notified.

## 2020-10-12 ENCOUNTER — Encounter: Payer: Self-pay | Admitting: Gastroenterology

## 2020-10-12 LAB — URINE CULTURE

## 2020-10-12 LAB — SURGICAL PATHOLOGY

## 2020-10-12 NOTE — Telephone Encounter (Signed)
Lmom, waiting on a return call.  

## 2020-10-12 NOTE — Telephone Encounter (Signed)
Alyssa Carey, we need her to be seen earlier than May if at all possible. Is there anything in April?

## 2020-10-12 NOTE — Telephone Encounter (Signed)
Spoke with pt. Pt states that she is doing ok. Pt was given follow up appointment date in May 2022. Pt will follow up as directed.

## 2020-10-12 NOTE — Telephone Encounter (Signed)
WE HAVE ONE OPENING THIS COMING Monday AND THAT IS ALL UNTIL MAY

## 2020-10-14 LAB — CULTURE, BLOOD (ROUTINE X 2)
Culture: NO GROWTH
Culture: NO GROWTH
Special Requests: ADEQUATE

## 2020-10-15 ENCOUNTER — Encounter: Payer: Self-pay | Admitting: Internal Medicine

## 2020-11-24 ENCOUNTER — Ambulatory Visit: Payer: Medicaid Other | Admitting: Gastroenterology

## 2020-12-02 ENCOUNTER — Encounter: Payer: Self-pay | Admitting: Gastroenterology

## 2020-12-02 ENCOUNTER — Other Ambulatory Visit: Payer: Self-pay | Admitting: Gastroenterology

## 2020-12-02 ENCOUNTER — Ambulatory Visit: Payer: Medicaid Other | Admitting: Gastroenterology

## 2020-12-02 VITALS — BP 125/90 | HR 100 | Temp 97.1°F | Ht 61.0 in | Wt 160.6 lb

## 2020-12-02 DIAGNOSIS — Z933 Colostomy status: Secondary | ICD-10-CM

## 2020-12-02 DIAGNOSIS — R7989 Other specified abnormal findings of blood chemistry: Secondary | ICD-10-CM

## 2020-12-02 DIAGNOSIS — K279 Peptic ulcer, site unspecified, unspecified as acute or chronic, without hemorrhage or perforation: Secondary | ICD-10-CM | POA: Insufficient documentation

## 2020-12-02 MED ORDER — PANTOPRAZOLE SODIUM 40 MG PO TBEC: 40.0000 mg | DELAYED_RELEASE_TABLET | Freq: Two times a day (BID) | ORAL | 5 refills | Status: DC

## 2020-12-02 NOTE — H&P (View-Only) (Signed)
Referring Provider: [facility] Primary Care Physician:  [facility]: previously unassigned, care in [place] and wants to establish here.    Chief Complaint  Patient presents with  . hospital f/u    No appetite. No bleeding    HPI:   Alyssa Carey is a 50 y.o. female presenting today with a history of necrotizing fasciitis s/p diverting colostomy June 2021, history of ETOH abuse, presenting in follow-up after admission for GI bleed in March 2022. CT on admission with pancolitis, large parastomal hernia. EGD and colonoscopy through ostomy/proctoscopy completed. EGD with non-bleeding gastric ulcer and colonoscopy with single healing rectal ulcer in distal rectum with surrounding mucosal friability and markedly abnormal proximal colon with ulceration s/p biopsies. Pathology with H.pylori negative. GI path panel resulted after she was discharged with enterotoxigenic E.coli. Supportive care recommended.   Needs surveillance EGD arranged. States her eating habits have been "messed up" since she had her bag placed. No energy. Doesn't feel like going anywhere. Afraid to go anywhere because of leaking in the bag. Mashed potatoes and gravy, macaroni, chicken noodle soup. Mainly soft foods and liquids, protein shakes. Doesn't like yogurt or anything of substance. If more tougher textures, will have vomiting. Not taking Protonix. Hasn't been taking since discharge. Stools alter between soft and liquid. No ETOH use. No overt GI bleeding.   Taking Ibuprofen as needed. No aspirin powders.   Past Medical History:  Diagnosis Date  . HTN (hypertension) 10/05/2020  . Necrotizing fasciitis [facility])     Past Surgical History:  Procedure Laterality Date  . BIOPSY  10/07/2020   Procedure: BIOPSY;  Surgeon: Daneil Dolin, MD;  [facility];  Service: Endoscopy;;  . COLONOSCOPY WITH PROPOFOL N/A 10/07/2020   single healing rectal ulcer in distal  rectum with surrounding mucosal friability and markedly abnormal proximal colon with ulceration s/p biopsies.  . ESOPHAGOGASTRODUODENOSCOPY (EGD) WITH PROPOFOL N/A 10/07/2020   non-bleeding gastric ulcer . Pathology with H.pylori negative  . FLEXIBLE SIGMOIDOSCOPY N/A 01/11/2020   Procedure: FLEXIBLE SIGMOIDOSCOPY;  Surgeon: Carol Ada, MD;  Location: [facility];  Service: Gastroenterology;  Laterality: N/A;  . INCISION AND DRAINAGE PERIRECTAL ABSCESS N/A 12/25/2019   Procedure: IRRIGATION AND DEBRIDEMENT BUTTOCKS, LAP LOOP COLOSTOMY;  Surgeon: Greer Pickerel, MD;  Location: [facility];  Service: General;  Laterality: N/A;  . IRRIGATION AND DEBRIDEMENT ABSCESS N/A 12/23/2019   Procedure: EXCISION AND DEBRIDEMENT LEFT BUTTOCK AND PERINEUM;  Surgeon: Clovis Riley, MD;  [facility]: [facility];  Service: General;  Laterality: N/A;  . LAPAROSCOPIC LOOP COLOSTOMY N/A 12/25/2019   Procedure: LAPAROSCOPIC LOOP COLOSTOMY;  Surgeon: Greer Pickerel, MD;  [facility]: [facility];  Service: General;  Laterality: N/A;    Current Outpatient Medications  Medication Sig Dispense Refill  . acetaminophen (TYLENOL) 325 MG tablet Take 650 mg by mouth every 6 (six) hours as needed.    . hydrOXYzine (ATARAX/VISTARIL) 25 MG tablet Take 1 tablet (25 mg total) by mouth 3 (three) times daily as needed for itching. 30 tablet 0  . ibuprofen (ADVIL) 600 MG tablet Take 600 mg by mouth every 6 (six) hours as needed.    . mineral oil-hydrophilic petrolatum (AQUAPHOR) ointment Apply topically 3 (three) times daily. 420 g 0  . Multiple Vitamin (MULTIVITAMIN WITH MINERALS) TABS tablet Take 1 tablet by mouth daily.    . Nutritional Supplements (BOOST PO) Take by mouth. 3-4 daily    . pantoprazole (PROTONIX) 40 MG tablet Take  1 tablet (40 mg total) by mouth 2 (two) times daily before a meal. 60 tablet 5  . Skin Protectants, Misc. (EUCERIN) cream Apply topically as needed for dry skin. 454 g 0   No current facility-administered medications for  this visit.    Allergies as of 12/02/2020 - Review Complete 12/02/2020  Allergen Reaction Noted  . Penicillins  12/21/2019  . Oxycodone hcl Rash 10/06/2020    Family History  Problem Relation Age of Onset  . Colon polyps Mother        does not believe adenomas  . Colon cancer Neg Hx     Social History   Socioeconomic History  . Marital status: Single    Spouse name: Not on file  . Number of children: 1  . Years of education: Not on file  . Highest education level: Not on file  Occupational History  . Occupation: unknown  Tobacco Use  . Smoking status: Never Smoker  . Smokeless tobacco: Never Used  Vaping Use  . Vaping Use: Never used  Substance and Sexual Activity  . Alcohol use: Not Currently  . Drug use: Not Currently  . Sexual activity: Not Currently  Other Topics Concern  . Not on file  Social History Narrative   Patient found in hotel room; unsure of living situation prior.   Social Determinants of Health   Financial Resource Strain: Not on file  Food Insecurity: Not on file  Transportation Needs: Not on file  Physical Activity: Not on file  Stress: Not on file  Social Connections: Not on file    Review of Systems: Gen: Denies fever, chills, anorexia. Denies fatigue, weakness, weight loss.  CV: Denies chest pain, palpitations, syncope, peripheral edema, and claudication. Resp: Denies dyspnea at rest, cough, wheezing, coughing up blood, and pleurisy. GI: see HPI Derm: Denies rash, itching, dry skin Psych: Denies depression, anxiety, memory loss, confusion. No homicidal or suicidal ideation.  Heme: Denies bruising, bleeding, and enlarged lymph nodes.  Physical Exam: BP 125/90   Pulse 100   Temp (!) 97.1 F (36.2 C) (Temporal)   Ht 5' 1"  (1.549 m)   Wt 160 lb 9.6 oz (72.8 kg)   BMI 30.35 kg/m  General:   Alert and oriented. No distress noted. Pleasant and cooperative.  Head:  Normocephalic and atraumatic. Eyes:  Conjuctiva clear without scleral  icterus. Mouth:  Mask in place Abdomen:  +BS, soft, non-tender and non-distended. No rebound or guarding. No HSM. LLQ colostomy with parastomal hernia Msk:  Symmetrical without gross deformities. Normal posture. Extremities:  Without edema. Neurologic:  Alert and  oriented x4 Psych:  Alert and cooperative. Normal mood and affect.  ASSESSMENT: Alyssa Carey is a 50 y.o. female presenting today with a history of necrotizing fasciitis s/p diverting colostomy June 2021, history of ETOH abuse, presenting in follow-up after admission for GI bleed in March 2022. On admission, CT showed pancolitis, large parastomal hernia. EGD and colonoscopy through ostomy/proctoscopy completed. EGD with non-bleeding gastric ulcer and colonoscopy with single healing rectal ulcer in distal rectum with surrounding mucosal friability and markedly abnormal proximal colon with ulceration s/p biopsies. Pathology with H.pylori negative. GI path panel resulted after she was discharged with enterotoxigenic E.coli. Only supportive care as she had improved.   PUD: needs surveillance EGD arranged. Unfortunately, she has NOT been taking a PPI for unclear reasons. This is likely contributing to her symptoms of decreased appetite, inability to advance past soft foods. We discussed importance of PPI BID, and I  have sent this to her pharmacy. She is also taking Ibuprofen intermittently, which we discussed also completely avoiding. She was applauded on avoiding ETOH.  Pancolitis: in setting of E.coli. Will review Dr. Marcello Moores' notes to see if she desires early interval colonoscopy prior to colostomy takedown, which will be scheduled once cleared from GI standpoint.   Elevated LFTs: inpatient in setting of history of ETOH use, fatty liver. Repeat now.   Anemia: without overt GI bleeding. Multifactorial in setting of history of acute blood loss anemia, ETOH use, possibly chronic disease component. Recheck CBC and iron studies now.   PLAN:   Protonix BID sent to pharmacy Requesting Dr. Marcello Moores' notes Certainly needs EGD for ulcer surveillance, +/- colonoscopy to be determined CBC, CMP, iron studies ordered Avoid NSAIDs, avoid ETOH Further recommendations to follow   Annitta Needs, PhD, ANP-BC [facility]    Addendum 5/24: received Dr. Marcello Moores' notes. She is planning on reversal and will pursue CTA to confirm vascular perfusion if no further work-up needed. I suspect her findings were related to E.coli. I have reached out to Dr. Abbey Chatters to discuss if we need to pursue colonoscopy at time of EGD.  Addendum: I spoke with Dr. Abbey Chatters. No need for repeat colonoscopy. Will pursue EGD only. Proceed with upper endoscopy by Dr. Abbey Chatters in near future: the risks, benefits, and alternatives have been discussed with the patient in detail. The patient states understanding and desires to proceed.  Annitta Needs, PhD, ANP-BC [facility]

## 2020-12-02 NOTE — Progress Notes (Addendum)
Referring Provider: [facility] Primary Care Physician:  [facility]: previously unassigned, care in [place] and wants to establish here.    Chief Complaint  Patient presents with  . hospital f/u    No appetite. No bleeding    HPI:   Alyssa Carey is a 50 y.o. female presenting today with a history of necrotizing fasciitis s/p diverting colostomy June 2021, history of ETOH abuse, presenting in follow-up after admission for GI bleed in March 2022. CT on admission with pancolitis, large parastomal hernia. EGD and colonoscopy through ostomy/proctoscopy completed. EGD with non-bleeding gastric ulcer and colonoscopy with single healing rectal ulcer in distal rectum with surrounding mucosal friability and markedly abnormal proximal colon with ulceration s/p biopsies. Pathology with H.pylori negative. GI path panel resulted after she was discharged with enterotoxigenic E.coli. Supportive care recommended.   Needs surveillance EGD arranged. States her eating habits have been "messed up" since she had her bag placed. No energy. Doesn't feel like going anywhere. Afraid to go anywhere because of leaking in the bag. Mashed potatoes and gravy, macaroni, chicken noodle soup. Mainly soft foods and liquids, protein shakes. Doesn't like yogurt or anything of substance. If more tougher textures, will have vomiting. Not taking Protonix. Hasn't been taking since discharge. Stools alter between soft and liquid. No ETOH use. No overt GI bleeding.   Taking Ibuprofen as needed. No aspirin powders.   Past Medical History:  Diagnosis Date  . HTN (hypertension) 10/05/2020  . Necrotizing fasciitis [facility])     Past Surgical History:  Procedure Laterality Date  . BIOPSY  10/07/2020   Procedure: BIOPSY;  Surgeon: Daneil Dolin, MD;  [facility];  Service: Endoscopy;;  . COLONOSCOPY WITH PROPOFOL N/A 10/07/2020   single healing rectal ulcer in distal  rectum with surrounding mucosal friability and markedly abnormal proximal colon with ulceration s/p biopsies.  . ESOPHAGOGASTRODUODENOSCOPY (EGD) WITH PROPOFOL N/A 10/07/2020   non-bleeding gastric ulcer . Pathology with H.pylori negative  . FLEXIBLE SIGMOIDOSCOPY N/A 01/11/2020   Procedure: FLEXIBLE SIGMOIDOSCOPY;  Surgeon: Carol Ada, MD;  Location: [facility];  Service: Gastroenterology;  Laterality: N/A;  . INCISION AND DRAINAGE PERIRECTAL ABSCESS N/A 12/25/2019   Procedure: IRRIGATION AND DEBRIDEMENT BUTTOCKS, LAP LOOP COLOSTOMY;  Surgeon: Greer Pickerel, MD;  Location: [facility];  Service: General;  Laterality: N/A;  . IRRIGATION AND DEBRIDEMENT ABSCESS N/A 12/23/2019   Procedure: EXCISION AND DEBRIDEMENT LEFT BUTTOCK AND PERINEUM;  Surgeon: Clovis Riley, MD;  Location[facility];  Service: General;  Laterality: N/A;  . LAPAROSCOPIC LOOP COLOSTOMY N/A 12/25/2019   Procedure: LAPAROSCOPIC LOOP COLOSTOMY;  Surgeon: Greer Pickerel, MD;  [facility];  Service: General;  Laterality: N/A;    Current Outpatient Medications  Medication Sig Dispense Refill  . acetaminophen (TYLENOL) 325 MG tablet Take 650 mg by mouth every 6 (six) hours as needed.    . hydrOXYzine (ATARAX/VISTARIL) 25 MG tablet Take 1 tablet (25 mg total) by mouth 3 (three) times daily as needed for itching. 30 tablet 0  . ibuprofen (ADVIL) 600 MG tablet Take 600 mg by mouth every 6 (six) hours as needed.    . mineral oil-hydrophilic petrolatum (AQUAPHOR) ointment Apply topically 3 (three) times daily. 420 g 0  . Multiple Vitamin (MULTIVITAMIN WITH MINERALS) TABS tablet Take 1 tablet by mouth daily.    . Nutritional Supplements (BOOST PO) Take by mouth. 3-4 daily    . pantoprazole (PROTONIX) 40 MG tablet Take  1 tablet (40 mg total) by mouth 2 (two) times daily before a meal. 60 tablet 5  . Skin Protectants, Misc. (EUCERIN) cream Apply topically as needed for dry skin. 454 g 0   No current facility-administered medications for  this visit.    Allergies as of 12/02/2020 - Review Complete 12/02/2020  Allergen Reaction Noted  . Penicillins  12/21/2019  . Oxycodone hcl Rash 10/06/2020    Family History  Problem Relation Age of Onset  . Colon polyps Mother        does not believe adenomas  . Colon cancer Neg Hx     Social History   Socioeconomic History  . Marital status: Single    Spouse name: Not on file  . Number of children: 1  . Years of education: Not on file  . Highest education level: Not on file  Occupational History  . Occupation: unknown  Tobacco Use  . Smoking status: Never Smoker  . Smokeless tobacco: Never Used  Vaping Use  . Vaping Use: Never used  Substance and Sexual Activity  . Alcohol use: Not Currently  . Drug use: Not Currently  . Sexual activity: Not Currently  Other Topics Concern  . Not on file  Social History Narrative   Patient found in hotel room; unsure of living situation prior.   Social Determinants of Health   Financial Resource Strain: Not on file  Food Insecurity: Not on file  Transportation Needs: Not on file  Physical Activity: Not on file  Stress: Not on file  Social Connections: Not on file    Review of Systems: Gen: Denies fever, chills, anorexia. Denies fatigue, weakness, weight loss.  CV: Denies chest pain, palpitations, syncope, peripheral edema, and claudication. Resp: Denies dyspnea at rest, cough, wheezing, coughing up blood, and pleurisy. GI: see HPI Derm: Denies rash, itching, dry skin Psych: Denies depression, anxiety, memory loss, confusion. No homicidal or suicidal ideation.  Heme: Denies bruising, bleeding, and enlarged lymph nodes.  Physical Exam: BP 125/90   Pulse 100   Temp (!) 97.1 F (36.2 C) (Temporal)   Ht 5' 1"  (1.549 m)   Wt 160 lb 9.6 oz (72.8 kg)   BMI 30.35 kg/m  General:   Alert and oriented. No distress noted. Pleasant and cooperative.  Head:  Normocephalic and atraumatic. Eyes:  Conjuctiva clear without scleral  icterus. Mouth:  Mask in place Abdomen:  +BS, soft, non-tender and non-distended. No rebound or guarding. No HSM. LLQ colostomy with parastomal hernia Msk:  Symmetrical without gross deformities. Normal posture. Extremities:  Without edema. Neurologic:  Alert and  oriented x4 Psych:  Alert and cooperative. Normal mood and affect.  ASSESSMENT: Alyssa Carey is a 50 y.o. female presenting today with a history of necrotizing fasciitis s/p diverting colostomy June 2021, history of ETOH abuse, presenting in follow-up after admission for GI bleed in March 2022. On admission, CT showed pancolitis, large parastomal hernia. EGD and colonoscopy through ostomy/proctoscopy completed. EGD with non-bleeding gastric ulcer and colonoscopy with single healing rectal ulcer in distal rectum with surrounding mucosal friability and markedly abnormal proximal colon with ulceration s/p biopsies. Pathology with H.pylori negative. GI path panel resulted after she was discharged with enterotoxigenic E.coli. Only supportive care as she had improved.   PUD: needs surveillance EGD arranged. Unfortunately, she has NOT been taking a PPI for unclear reasons. This is likely contributing to her symptoms of decreased appetite, inability to advance past soft foods. We discussed importance of PPI BID, and I  have sent this to her pharmacy. She is also taking Ibuprofen intermittently, which we discussed also completely avoiding. She was applauded on avoiding ETOH.  Pancolitis: in setting of E.coli. Will review Dr. Marcello Moores' notes to see if she desires early interval colonoscopy prior to colostomy takedown, which will be scheduled once cleared from GI standpoint.   Elevated LFTs: inpatient in setting of history of ETOH use, fatty liver. Repeat now.   Anemia: without overt GI bleeding. Multifactorial in setting of history of acute blood loss anemia, ETOH use, possibly chronic disease component. Recheck CBC and iron studies now.   PLAN:   Protonix BID sent to pharmacy Requesting Dr. Marcello Moores' notes Certainly needs EGD for ulcer surveillance, +/- colonoscopy to be determined CBC, CMP, iron studies ordered Avoid NSAIDs, avoid ETOH Further recommendations to follow   Annitta Needs, PhD, ANP-BC [facility]    Addendum 5/24: received Dr. Marcello Moores' notes. She is planning on reversal and will pursue CTA to confirm vascular perfusion if no further work-up needed. I suspect her findings were related to E.coli. I have reached out to Dr. Abbey Chatters to discuss if we need to pursue colonoscopy at time of EGD.  Addendum: I spoke with Dr. Abbey Chatters. No need for repeat colonoscopy. Will pursue EGD only. Proceed with upper endoscopy by Dr. Abbey Chatters in near future: the risks, benefits, and alternatives have been discussed with the patient in detail. The patient states understanding and desires to proceed.  Annitta Needs, PhD, ANP-BC [facility]

## 2020-12-02 NOTE — Patient Instructions (Signed)
Please pick up the prescription for pantoprazole (Protonix) to take twice a day, 30 minutes before breakfast and dinner. It is important to take this to help heal the ulcer.  I am retrieving Dr. Marcello Moores' notes so we can see if she would like a colonoscopy prior to colostomy takedown. You will definitely need an upper endoscopy to evaluate ulcer healing, and we can do both of these at same time.  Please have blood work done today.  Please avoid all Ibuprofen, Advil, Aleve, Motrin, BC powders, Goody powders, etc.  Further recommendations regarding procedures to follow!  I enjoyed seeing you again today! As you know, I value our relationship and want to provide genuine, compassionate, and quality care. I welcome your feedback. If you receive a survey regarding your visit,  I greatly appreciate you taking time to fill this out. See you next time!  Annitta Needs, PhD, ANP-BC [facility]enter[facility]

## 2020-12-03 LAB — CBC WITH DIFFERENTIAL/PLATELET
Absolute Monocytes: 676 cells/uL (ref 200–950)
Basophils Absolute: 18 cells/uL (ref 0–200)
Basophils Relative: 0.4 %
Eosinophils Absolute: 9 cells/uL — ABNORMAL LOW (ref 15–500)
Eosinophils Relative: 0.2 %
HCT: 36.3 % (ref 35.0–45.0)
Hemoglobin: 12.1 g/dL (ref 11.7–15.5)
Lymphs Abs: 883 cells/uL (ref 850–3900)
MCH: 31.3 pg (ref 27.0–33.0)
MCHC: 33.3 g/dL (ref 32.0–36.0)
MCV: 93.8 fL (ref 80.0–100.0)
MPV: 11.5 fL (ref 7.5–12.5)
Monocytes Relative: 14.7 %
Neutro Abs: 3013 cells/uL (ref 1500–7800)
Neutrophils Relative %: 65.5 %
Platelets: 159 10*3/uL (ref 140–400)
RBC: 3.87 10*6/uL (ref 3.80–5.10)
RDW: 11.9 % (ref 11.0–15.0)
Total Lymphocyte: 19.2 %
WBC: 4.6 10*3/uL (ref 3.8–10.8)

## 2020-12-03 LAB — COMPLETE METABOLIC PANEL WITH GFR
AG Ratio: 1 (calc) (ref 1.0–2.5)
ALT: 104 U/L — ABNORMAL HIGH (ref 6–29)
AST: 422 U/L — ABNORMAL HIGH (ref 10–35)
Albumin: 3.4 g/dL — ABNORMAL LOW (ref 3.6–5.1)
Alkaline phosphatase (APISO): 342 U/L — ABNORMAL HIGH (ref 31–125)
BUN/Creatinine Ratio: 9 (calc) (ref 6–22)
BUN: 4 mg/dL — ABNORMAL LOW (ref 7–25)
CO2: 28 mmol/L (ref 20–32)
Calcium: 9.2 mg/dL (ref 8.6–10.2)
Chloride: 95 mmol/L — ABNORMAL LOW (ref 98–110)
Creat: 0.47 mg/dL — ABNORMAL LOW (ref 0.50–1.10)
GFR, Est African American: 136 mL/min/{1.73_m2} (ref >=60)
GFR, Est Non African American: 118 mL/min/{1.73_m2} (ref >=60)
Globulin: 3.3 g/dL (calc) (ref 1.9–3.7)
Glucose, Bld: 105 mg/dL — ABNORMAL HIGH (ref 65–99)
Potassium: 3 mmol/L — ABNORMAL LOW (ref 3.5–5.3)
Sodium: 136 mmol/L (ref 135–146)
Total Bilirubin: 0.9 mg/dL (ref 0.2–1.2)
Total Protein: 6.7 g/dL (ref 6.1–8.1)

## 2020-12-03 LAB — IRON,TIBC AND FERRITIN PANEL
%SAT: 37 % (calc) (ref 16–45)
Ferritin: 316 ng/mL — ABNORMAL HIGH (ref 16–232)
Iron: 67 ug/dL (ref 40–190)
TIBC: 183 mcg/dL (calc) — ABNORMAL LOW (ref 250–450)

## 2020-12-07 ENCOUNTER — Telehealth: Payer: Self-pay | Admitting: Gastroenterology

## 2020-12-07 NOTE — Telephone Encounter (Signed)
[facility] Clinical Pool:  We need to arrange EGD with Propofol by Dr. Abbey Chatters. Reason: PUD. ASA 2.   I spoke with Dr. Abbey Chatters. No need for colonoscopy at this time.

## 2020-12-08 NOTE — Telephone Encounter (Signed)
Spoke to pt, EGD scheduled for 12/27/20 at 1:00pm. COVID test 12/24/20. Orders entered. Appt letter mailed with procedure instructions.

## 2020-12-08 NOTE — Telephone Encounter (Signed)
Tried to call pt, LMOVM for return call.

## 2020-12-09 ENCOUNTER — Other Ambulatory Visit: Payer: Self-pay | Admitting: Gastroenterology

## 2020-12-09 MED ORDER — POTASSIUM CHLORIDE CRYS ER 20 MEQ PO TBCR
40.0000 meq | EXTENDED_RELEASE_TABLET | Freq: Two times a day (BID) | ORAL | 0 refills | Status: DC
Start: 2020-12-09 — End: 2020-12-27

## 2020-12-14 ENCOUNTER — Other Ambulatory Visit: Payer: Self-pay

## 2020-12-14 DIAGNOSIS — D649 Anemia, unspecified: Secondary | ICD-10-CM

## 2020-12-14 DIAGNOSIS — R7989 Other specified abnormal findings of blood chemistry: Secondary | ICD-10-CM

## 2020-12-14 DIAGNOSIS — K7290 Hepatic failure, unspecified without coma: Secondary | ICD-10-CM

## 2020-12-14 DIAGNOSIS — D62 Acute posthemorrhagic anemia: Secondary | ICD-10-CM

## 2020-12-14 DIAGNOSIS — K7682 Hepatic encephalopathy: Secondary | ICD-10-CM

## 2020-12-14 DIAGNOSIS — K709 Alcoholic liver disease, unspecified: Secondary | ICD-10-CM

## 2020-12-24 ENCOUNTER — Other Ambulatory Visit: Payer: Self-pay

## 2020-12-24 ENCOUNTER — Other Ambulatory Visit (HOSPITAL_COMMUNITY)
Admission: RE | Admit: 2020-12-24 | Discharge: 2020-12-24 | Disposition: A | Discharge status: Home / Self Care | Payer: Self-pay | Source: Ambulatory Visit | Attending: Internal Medicine | Admitting: Internal Medicine

## 2020-12-24 DIAGNOSIS — Z01812 Encounter for preprocedural laboratory examination: Secondary | ICD-10-CM | POA: Insufficient documentation

## 2020-12-24 LAB — PREGNANCY, URINE: Preg Test, Ur: NEGATIVE

## 2020-12-27 ENCOUNTER — Encounter (HOSPITAL_COMMUNITY)
Admission: RE | Disposition: A | Discharge status: Home / Self Care | Payer: Self-pay | Source: Home / Self Care | Attending: Internal Medicine

## 2020-12-27 ENCOUNTER — Ambulatory Visit (HOSPITAL_COMMUNITY): Payer: Self-pay | Admitting: Anesthesiology

## 2020-12-27 ENCOUNTER — Encounter: Payer: Self-pay | Admitting: Internal Medicine

## 2020-12-27 ENCOUNTER — Encounter (HOSPITAL_COMMUNITY): Payer: Self-pay

## 2020-12-27 ENCOUNTER — Ambulatory Visit (HOSPITAL_COMMUNITY)
Admission: RE | Admit: 2020-12-27 | Discharge: 2020-12-27 | Disposition: A | Discharge status: Home / Self Care | Payer: Self-pay | Attending: Internal Medicine | Admitting: Internal Medicine

## 2020-12-27 ENCOUNTER — Other Ambulatory Visit: Payer: Self-pay

## 2020-12-27 DIAGNOSIS — Z6834 Body mass index (BMI) 34.0-34.9, adult: Secondary | ICD-10-CM | POA: Insufficient documentation

## 2020-12-27 DIAGNOSIS — Z791 Long term (current) use of non-steroidal anti-inflammatories (NSAID): Secondary | ICD-10-CM | POA: Insufficient documentation

## 2020-12-27 DIAGNOSIS — K279 Peptic ulcer, site unspecified, unspecified as acute or chronic, without hemorrhage or perforation: Secondary | ICD-10-CM

## 2020-12-27 DIAGNOSIS — Z8711 Personal history of peptic ulcer disease: Secondary | ICD-10-CM | POA: Insufficient documentation

## 2020-12-27 DIAGNOSIS — Z933 Colostomy status: Secondary | ICD-10-CM | POA: Insufficient documentation

## 2020-12-27 DIAGNOSIS — Z880 Allergy status to penicillin: Secondary | ICD-10-CM | POA: Insufficient documentation

## 2020-12-27 DIAGNOSIS — K2970 Gastritis, unspecified, without bleeding: Secondary | ICD-10-CM | POA: Insufficient documentation

## 2020-12-27 DIAGNOSIS — R634 Abnormal weight loss: Secondary | ICD-10-CM | POA: Insufficient documentation

## 2020-12-27 DIAGNOSIS — Z79899 Other long term (current) drug therapy: Secondary | ICD-10-CM | POA: Insufficient documentation

## 2020-12-27 HISTORY — PX: ESOPHAGOGASTRODUODENOSCOPY (EGD) WITH PROPOFOL: SHX5813

## 2020-12-27 SURGERY — ESOPHAGOGASTRODUODENOSCOPY (EGD) WITH PROPOFOL
Anesthesia: General

## 2020-12-27 MED ORDER — PANTOPRAZOLE SODIUM 40 MG PO TBEC: 40.0000 mg | DELAYED_RELEASE_TABLET | Freq: Every day | ORAL | 5 refills | Status: DC

## 2020-12-27 MED ORDER — PROPOFOL 10 MG/ML IV BOLUS
INTRAVENOUS | Status: DC | PRN
  Administered 2020-12-27: 150 ug/kg/min via INTRAVENOUS
  Administered 2020-12-27: 80 mg via INTRAVENOUS
  Administered 2020-12-27: 70 mg via INTRAVENOUS
  Administered 2020-12-27: 100 mg via INTRAVENOUS

## 2020-12-27 MED ORDER — LACTATED RINGERS IV SOLN: INTRAVENOUS | Status: DC

## 2020-12-27 NOTE — Interval H&P Note (Signed)
History and Physical Interval Note:  12/27/2020 12:07 PM  Alyssa Carey  has presented today for surgery, with the diagnosis of peptic ulcer disease.  The various methods of treatment have been discussed with the patient and family. After consideration of risks, benefits and other options for treatment, the patient has consented to  Procedure(s) with comments: ESOPHAGOGASTRODUODENOSCOPY (EGD) WITH PROPOFOL (N/A) - 1:00pm as a surgical intervention.  The patient's history has been reviewed, patient examined, no change in status, stable for surgery.  I have reviewed the patient's chart and labs.  Questions were answered to the patient's satisfaction.     [name]

## 2020-12-27 NOTE — Transfer of Care (Signed)
Immediate Anesthesia Transfer of Care Note  Patient: Alyssa Carey  Procedure(s) Performed: ESOPHAGOGASTRODUODENOSCOPY (EGD) WITH PROPOFOL  Patient Location: Endoscopy Unit  Anesthesia Type:General  Level of Consciousness: awake, alert , oriented and patient cooperative  Airway & Oxygen Therapy: Patient Spontanous Breathing  Post-op Assessment: Report given to RN, Post -op Vital signs reviewed and stable and Patient moving all extremities  Post vital signs: Reviewed and stable  Last Vitals:  Vitals Value Taken Time  BP    Temp    Pulse    Resp    SpO2      Last Pain:  Vitals:   12/27/20 1214  TempSrc:   PainSc: 0-No pain      Patients Stated Pain Goal: 6 (67/01/10 0349)  Complications: No notable events documented.

## 2020-12-27 NOTE — Op Note (Signed)
[facility] Patient Name: Alyssa Carey Procedure Date: 12/27/2020 12:09 PM MRN: [id number] Date of Birth: [birth year] Attending MD: Elon Alas. Abbey Chatters DO CSN: [id number] Age: 50 Admit Type: Outpatient Procedure:                Upper GI endoscopy Indications:              Follow-up of peptic ulcer, Anorexia, Weight loss Providers:                Elon Alas. [name], DO, [name], RN,                            Aram Candela Referring MD:              Medicines:                See the Anesthesia note for documentation of the                            administered medications Complications:            No immediate complications. Estimated Blood Loss:     Estimated blood loss: none. Procedure:                Pre-Anesthesia Assessment:                           - The anesthesia plan was to use monitored                            anesthesia care (MAC).                           After obtaining informed consent, the endoscope was                            passed under direct vision. Throughout the                            procedure, the patient's blood pressure, pulse, and                            oxygen saturations were monitored continuously. The                            [id number]-H190 ([id number]) scope was introduced through the                            mouth, and advanced to the second part of duodenum.                            The upper GI endoscopy was accomplished without                            difficulty. The patient tolerated the procedure                            well.  Scope In: 12:18:03 PM Scope Out: 12:22:10 PM Total Procedure Duration: 0 hours 4 minutes 7 seconds  Findings:      There is no endoscopic evidence of bleeding, areas of erosion,       esophagitis, ulcerations or varices in the entire esophagus.      Localized mild inflammation characterized by erythema was found in the       gastric antrum.      There is no endoscopic evidence of  ulceration in the entire examined       stomach.      The duodenal bulb, first portion of the duodenum and second portion of       the duodenum were normal. Impression:               - Gastritis.                           - Normal duodenal bulb, first portion of the                            duodenum and second portion of the duodenum.                           - No specimens collected. Moderate Sedation:      Per Anesthesia Care Recommendation:           - Patient has a contact number available for                            emergencies. The signs and symptoms of potential                            delayed complications were discussed with the                            patient. Return to normal activities tomorrow.                            Written discharge instructions were provided to the                            patient.                           - Resume previous diet.                           - Continue present medications.                           - Use a proton pump inhibitor PO daily.                           - Return to GI clinic in 3 months. Procedure Code(s):        --- Professional ---                           [phone], Esophagogastroduodenoscopy, flexible,  transoral; diagnostic, including collection of                            specimen(s) by brushing or washing, when performed                            (separate procedure) Diagnosis Code(s):        --- Professional ---                           K29.70, Gastritis, unspecified, without bleeding                           K27.9, Peptic ulcer, site unspecified, unspecified                            as acute or chronic, without hemorrhage or                            perforation                           R63.0, Anorexia                           R63.4, Abnormal weight loss CPT copyright 2019 American [employer] Association. All rights reserved. The codes documented in this report are  preliminary and upon coder review may  be revised to meet current compliance requirements. Elon Alas. Abbey Chatters, DO [place] Abbey Chatters, DO 12/27/2020 12:25:54 PM This report has been signed electronically. Number of Addenda: 0

## 2020-12-27 NOTE — Discharge Instructions (Addendum)
EGD Discharge instructions Please read the instructions outlined below and refer to this sheet in the next few weeks. These discharge instructions provide you with general information on caring for yourself after you leave the hospital. Your doctor may also give you specific instructions. While your treatment has been planned according to the most current medical practices available, unavoidable complications occasionally occur. If you have any problems or questions after discharge, please call your doctor. ACTIVITY You may resume your regular activity but move at a slower pace for the next 24 hours.  Take frequent rest periods for the next 24 hours.  Walking will help expel (get rid of) the air and reduce the bloated feeling in your abdomen.  No driving for 24 hours (because of the anesthesia (medicine) used during the test).  You may shower.  Do not sign any important legal documents or operate any machinery for 24 hours (because of the anesthesia used during the test).  NUTRITION Drink plenty of fluids.  You may resume your normal diet.  Begin with a light meal and progress to your normal diet.  Avoid alcoholic beverages for 24 hours or as instructed by your caregiver.  MEDICATIONS You may resume your normal medications unless your caregiver tells you otherwise.  WHAT YOU CAN EXPECT TODAY You may experience abdominal discomfort such as a feeling of fullness or "gas" pains.  FOLLOW-UP Your doctor will discuss the results of your test with you.  SEEK IMMEDIATE MEDICAL ATTENTION IF ANY OF THE FOLLOWING OCCUR: Excessive nausea (feeling sick to your stomach) and/or vomiting.  Severe abdominal pain and distention (swelling).  Trouble swallowing.  Temperature over 101 F (37.8 C).  Rectal bleeding or vomiting of blood.    Your EGD showed a mild amount inflammation your stomach.  The previously noted ulcers have healed.  I would recommend decreasing your pantoprazole to once daily.  Avoid  NSAIDs.  Follow-up with GI in 3 to 4 months.  Office to call with appointment.  I hope you have a great rest of your week!  Elon Alas. Abbey Chatters, D.O. Gastroenterology and Hepatology [facility]

## 2020-12-27 NOTE — Anesthesia Preprocedure Evaluation (Signed)
Anesthesia Evaluation  Patient identified by MRN, date of birth, ID band Patient awake    Reviewed: Allergy & Precautions, H&P , NPO status , Patient's Chart, lab work & pertinent test results, reviewed documented beta blocker date and time   Airway Mallampati: II  TM Distance: >3 FB Neck ROM: full    Dental no notable dental hx. (+) Teeth Intact   Pulmonary neg pulmonary ROS, Patient abstained from smoking.,    Pulmonary exam normal breath sounds clear to auscultation       Cardiovascular Exercise Tolerance: Good hypertension, negative cardio ROS   Rhythm:regular Rate:Normal     Neuro/Psych negative neurological ROS  negative psych ROS   GI/Hepatic Neg liver ROS, PUD, (+)       alcohol use,   Endo/Other  negative endocrine ROS  Renal/GU negative Renal ROS  negative genitourinary   Musculoskeletal   Abdominal   Peds  Hematology  (+) Blood dyscrasia, anemia ,   Anesthesia Other Findings   Reproductive/Obstetrics negative OB ROS                             Anesthesia Physical  Anesthesia Plan  ASA: 2  Anesthesia Plan: General   Post-op Pain Management:    Induction:   PONV Risk Score and Plan: Propofol infusion  Airway Management Planned:   Additional Equipment:   Intra-op Plan:   Post-operative Plan:   Informed Consent: I have reviewed the patients History and Physical, chart, labs and discussed the procedure including the risks, benefits and alternatives for the proposed anesthesia with the patient or authorized representative who has indicated his/her understanding and acceptance.     Dental Advisory Given  Plan Discussed with: CRNA  Anesthesia Plan Comments:         Anesthesia Quick Evaluation

## 2020-12-27 NOTE — Anesthesia Postprocedure Evaluation (Signed)
Anesthesia Post Note  Patient: Alyssa Carey  Procedure(s) Performed: ESOPHAGOGASTRODUODENOSCOPY (EGD) WITH PROPOFOL  Patient location during evaluation: Phase II Anesthesia Type: General Level of consciousness: awake Pain management: pain level controlled Vital Signs Assessment: post-procedure vital signs reviewed and stable Respiratory status: spontaneous breathing and respiratory function stable Cardiovascular status: blood pressure returned to baseline and stable Postop Assessment: no headache and no apparent nausea or vomiting Anesthetic complications: no Comments: Late entry   No notable events documented.   Last Vitals:  Vitals:   12/27/20 1137 12/27/20 1225  BP: 110/71 109/67  Pulse: 67   Resp: 12 15  Temp: 36.8 C 36.5 C  SpO2: 100% 100%    Last Pain:  Vitals:   12/27/20 1225  TempSrc: Oral  PainSc:                  Louann Sjogren

## 2020-12-30 ENCOUNTER — Emergency Department (HOSPITAL_COMMUNITY)
Admission: EM | Admit: 2020-12-30 | Discharge: 2020-12-30 | Disposition: A | Discharge status: Home / Self Care | Payer: Medicaid Other | Attending: Emergency Medicine | Admitting: Emergency Medicine

## 2020-12-30 ENCOUNTER — Other Ambulatory Visit: Payer: Self-pay

## 2020-12-30 ENCOUNTER — Encounter (HOSPITAL_COMMUNITY): Payer: Self-pay | Admitting: *Deleted

## 2020-12-30 DIAGNOSIS — R531 Weakness: Secondary | ICD-10-CM | POA: Insufficient documentation

## 2020-12-30 DIAGNOSIS — Z139 Encounter for screening, unspecified: Secondary | ICD-10-CM

## 2020-12-30 DIAGNOSIS — I10 Essential (primary) hypertension: Secondary | ICD-10-CM | POA: Insufficient documentation

## 2020-12-30 DIAGNOSIS — R630 Anorexia: Secondary | ICD-10-CM | POA: Insufficient documentation

## 2020-12-30 DIAGNOSIS — R110 Nausea: Secondary | ICD-10-CM | POA: Insufficient documentation

## 2020-12-30 LAB — BASIC METABOLIC PANEL
Anion gap: 14 (ref 5–15)
BUN: <5 mg/dL — ABNORMAL LOW (ref 6–20)
CO2: 24 mmol/L (ref 22–32)
Calcium: 8.7 mg/dL — ABNORMAL LOW (ref 8.9–10.3)
Chloride: 95 mmol/L — ABNORMAL LOW (ref 98–111)
Creatinine, Ser: 0.44 mg/dL (ref 0.44–1.00)
GFR, Estimated: >60 mL/min (ref >60)
Glucose, Bld: 61 mg/dL — ABNORMAL LOW (ref 70–99)
Potassium: 3.8 mmol/L (ref 3.5–5.1)
Sodium: 133 mmol/L — ABNORMAL LOW (ref 135–145)

## 2020-12-30 NOTE — ED Triage Notes (Signed)
Sent by her PCP due to low K level.

## 2020-12-30 NOTE — Discharge Instructions (Addendum)
As discussed, your potassium level today is in the normal range.  I recommend that you contact your providers office to let them know of your potassium level today.  Return to the ER if needed.

## 2020-12-30 NOTE — ED Provider Notes (Signed)
[facility] EMERGENCY DEPARTMENT Provider Note   CSN: [id number] Arrival date & time: 12/30/20  1300     History Chief Complaint  Patient presents with   Abnormal Lab    Alyssa Carey is a 50 y.o. female.   Abnormal Lab      Alyssa Carey is a 50 y.o. female with past medical history of peptic ulcer disease, blood loss anemia, pancolitis, and history of necrotizing fasciitis with status post diverting colostomy 6/21.  She presents to the Emergency Department for evaluation of possible hypokalemia.  States that she recently underwent EGD on 12/27/2020 and was notified this morning of a low potassium level and was recommended to come to the emergency department for further evaluation and possible IV potassium.  She admits to persistent nausea, generalized weakness and decreased appetite.  She states that symptoms have been present for some time and she is currently being evaluated by GI for symptoms.  States her appetite has decreased since placement of her colostomy bag.  She denies recent fever, chills, persistent vomiting, or diarrhea and new or worsening abdominal pain.  Does not currently take any PPIs due to intolerance   Past Medical History:  Diagnosis Date   HTN (hypertension) 10/05/2020   Necrotizing fasciitis 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   Hypernatremia 01/12/2020   Anasarca 01/12/2020   Necrotizing fasciitis of pelvic region and thigh ([place]) 12/28/2019    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   Acute respiratory failure with hypoxemia (HCC)    Elevated LFTs    Pressure injury of skin 12/22/2019   Fall at home, initial encounter 12/21/2019   Weakness 12/21/2019   Alcoholism ([place]) 12/21/2019   Thrombocytopenia ([place]) 12/21/2019   ARF (acute renal failure) ([place]) 12/21/2019   Hepatic encephalopathy ([place]) 00/17/4944   Alcoholic liver disease ([place]) 12/21/2019    Past Surgical History:  Procedure Laterality Date   BIOPSY  10/07/2020   Procedure: BIOPSY;  Surgeon: Daneil Dolin, MD;  Location: [facility];  Service: Endoscopy;;   COLONOSCOPY WITH PROPOFOL N/A 10/07/2020   single healing rectal ulcer in distal rectum with surrounding mucosal friability and markedly abnormal proximal colon with ulceration s/p biopsies.   ESOPHAGOGASTRODUODENOSCOPY (EGD) WITH PROPOFOL N/A 10/07/2020   non-bleeding gastric ulcer . Pathology with H.pylori negative   FLEXIBLE SIGMOIDOSCOPY N/A 01/11/2020   Procedure: FLEXIBLE SIGMOIDOSCOPY;  Surgeon: Carol Ada, MD;  Location: [facility] ENDOSCOPY;  Service: Gastroenterology;  Laterality: N/A;   INCISION AND DRAINAGE PERIRECTAL ABSCESS N/A 12/25/2019   Procedure: IRRIGATION AND DEBRIDEMENT BUTTOCKS, LAP LOOP COLOSTOMY;  Surgeon: Greer Pickerel, MD;  Location: [facility] ORS;  Service: General;  Laterality: N/A;   IRRIGATION AND DEBRIDEMENT ABSCESS N/A 12/23/2019   Procedure: EXCISION AND DEBRIDEMENT LEFT BUTTOCK AND PERINEUM;  Surgeon: Clovis Riley, MD;  Location: [facility] ORS;  Service: General;  Laterality: N/A;   LAPAROSCOPIC LOOP COLOSTOMY N/A 12/25/2019   Procedure: LAPAROSCOPIC LOOP COLOSTOMY;  Surgeon: Greer Pickerel, MD;  Location: [facility] ORS;  Service: General;  Laterality: N/A;     OB History   No obstetric history on file.     Family History  Problem Relation Age of Onset   Colon polyps Mother        does not believe adenomas   Colon cancer Neg Hx     Social History   Tobacco Use   Smoking status: Never    Smokeless tobacco: Never  Vaping Use   Vaping Use: Never used  Substance Use Topics   Alcohol use: Not Currently   Drug use: Not Currently    Home Medications Prior to Admission medications   Medication Sig Start Date End Date Taking? Authorizing Provider  Ascorbic Acid (VITA-C PO) Take 3,500 mg by mouth daily.   Yes [provider]  Nutritional Supplements (BOOST PO) Take by mouth. 3-4 daily   Yes [provider]  acetaminophen (TYLENOL) 325 MG tablet Take 650 mg by mouth every 6 (six) hours as needed.    [provider]  pantoprazole (PROTONIX) 40 MG tablet Take 1 tablet (40 mg total) by mouth daily. Patient not taking: Reported on 12/30/2020 12/27/20   [name], DO    Allergies    Penicillins and Oxycodone hcl  Review of Systems   Review of Systems  Constitutional:  Negative for chills, fatigue and fever.  Respiratory:  Negative for cough and shortness of breath.   Cardiovascular:  Negative for chest pain and palpitations.  Gastrointestinal:  Positive for nausea. Negative for abdominal pain, blood in stool and vomiting.  Genitourinary:  Negative for dysuria, flank pain and hematuria.  Musculoskeletal:  Negative for arthralgias, back pain, myalgias, neck pain and neck stiffness.  Skin:  Negative for rash.  Neurological:  Negative for dizziness, weakness (genralized weakness), numbness and headaches.  Hematological:  Does not bruise/bleed easily.   Physical Exam Updated Vital Signs BP 118/86 (BP Location: Left Arm)   Pulse 69   Temp 98.1 F (36.7 C) (Oral)   Resp 12   Ht 4' 9"  (1.448 m)   Wt 72.6 kg   SpO2 100%   BMI 34.62 kg/m   Physical Exam Vitals and nursing note reviewed.  Constitutional:      Appearance: Normal appearance. She is not ill-appearing.  HENT:     Head: Normocephalic.  Neck:     Thyroid: No thyromegaly.     Meningeal: Kernig's sign absent.  Cardiovascular:     Rate and Rhythm: Normal rate and regular  rhythm.     Pulses: Normal pulses.  Pulmonary:     Effort: Pulmonary effort is normal.     Breath sounds: Normal breath sounds. No wheezing.  Abdominal:     Palpations: Abdomen is soft.     Tenderness: There is no abdominal tenderness. There is no guarding or rebound.     Comments: Colostomy present  Musculoskeletal:        General: Normal range of motion.     Cervical back: Normal range of motion and neck supple.     Right lower leg: No edema.     Left lower leg: No edema.  Skin:    General: Skin is warm.     Findings: No rash.  Neurological:     General: No focal deficit present.     Mental Status: She is alert.     Sensory: No sensory deficit.     Motor: No weakness.    ED Results /  Procedures / Treatments   Labs (all labs ordered are listed, but only abnormal results are displayed) Labs Reviewed  BASIC METABOLIC PANEL - Abnormal; Notable for the following components:      Result Value   Sodium 133 (*)    Chloride 95 (*)    Glucose, Bld 61 (*)    BUN <5 (*)    Calcium 8.7 (*)    All other components within normal limits    EKG None  Radiology No results found.  Procedures Procedures   Medications Ordered in ED Medications - No data to display  ED Course  I have reviewed the triage vital signs and the nursing notes.  Pertinent labs & imaging results that were available during my care of the patient were reviewed by me and considered in my medical decision making (see chart for details).    MDM Rules/Calculators/A&P                          Patient here for evaluation of hypokalemia.  On review of medical records, patient had potassium of 3 on 12/02/2020.  Today, potassium within normal limits at 3.8.  No new or worsening symptoms.  Patient adamant that she was notified from [facility] office and not PCPs office regarding her potassium level and need for ER evaluation.  I will contact her GI provider. On review of her medical records, no notes from PCP or GI  office regarding ER recommendations.   1710  consulted GI, Dr. Abbey Chatters and discussed findings.  He is unaware of any recommendations from his office to have pt sent to ER for evaluation of low potassium.   Overall, patient well-appearing nontoxic.  Ambulatory with steady gait.  Vital signs are reassuring.  And her potassium level today was unremarkable.  I feel that she is appropriate for discharge home at this time, I have advised her to contact the office that she received a phone call from to notify of recent potassium level.   Final Clinical Impression(s) / ED Diagnoses Final diagnoses:  Encounter for medical screening examination    Rx / DC Orders ED Discharge Orders     None        Kem Parkinson, PA-C 12/30/20 1757    Hayden Rasmussen, MD 12/31/20 1139

## 2020-12-30 NOTE — ED Notes (Signed)
Pt sent here for abnormal lab from her PCP

## 2021-01-03 ENCOUNTER — Encounter (HOSPITAL_COMMUNITY): Payer: Self-pay | Admitting: Internal Medicine

## 2021-01-04 ENCOUNTER — Telehealth: Payer: Self-pay | Admitting: Gastroenterology

## 2021-01-04 NOTE — Telephone Encounter (Signed)
Please call patient, she states that we sent her to the hospital for a potassium injection and she was told her potassium was fine and she did not need it .  Said she went to the hospital for 5 hours for nothing

## 2021-01-10 NOTE — Telephone Encounter (Signed)
Pt called today and wants you to go ahead and send [facility] (Dr. Marcello Moores) information needed so they can take her colostomy bag off. She states that we are holding her up from getting this done.

## 2021-01-19 ENCOUNTER — Telehealth: Payer: Self-pay | Admitting: Gastroenterology

## 2021-01-19 NOTE — Telephone Encounter (Addendum)
   Dr. Marcello Moores with [facility] is requesting flex sig vs colonoscopy prior to colostomy takedown to ensure rectal ulcer resolved.   I spoke with Dr. Abbey Chatters. We need to find out how patient is doing clinically. If with diarrhea, abdominal pain, rectal bleeding, etc., then we need to do a complete colonoscopy. If symptoms improved, no diarrhea, etc., we can do a flex sig. I tried to call her to discuss but had to leave message.   When she returns call, we just need to know about the above so we can arrange.

## 2021-01-21 ENCOUNTER — Telehealth: Payer: Self-pay | Admitting: Internal Medicine

## 2021-01-21 NOTE — Telephone Encounter (Signed)
[phone] patient called and stated she was returning your call

## 2021-01-25 NOTE — Telephone Encounter (Signed)
Pt returned the call, I read off symptoms to her and she stated to me the only thing she is having a problem with "not having a appetite". I advised of the flex-sig verses colonoscopy and at this point it doesn't matter to her which one it is at this point. She just wants it off. So please proceed.

## 2021-01-25 NOTE — Telephone Encounter (Signed)
Returned call to the pt LMOVM to return call

## 2021-01-26 ENCOUNTER — Encounter (HOSPITAL_COMMUNITY): Payer: Self-pay

## 2021-01-26 ENCOUNTER — Ambulatory Visit (HOSPITAL_COMMUNITY): Payer: Self-pay

## 2021-01-26 ENCOUNTER — Other Ambulatory Visit (HOSPITAL_COMMUNITY): Payer: Self-pay | Admitting: Family Medicine

## 2021-01-26 ENCOUNTER — Telehealth: Payer: Self-pay

## 2021-01-26 ENCOUNTER — Other Ambulatory Visit (HOSPITAL_COMMUNITY): Payer: Medicaid Other

## 2021-01-26 ENCOUNTER — Other Ambulatory Visit: Payer: Self-pay | Admitting: Family Medicine

## 2021-01-26 DIAGNOSIS — K56609 Unspecified intestinal obstruction, unspecified as to partial versus complete obstruction: Secondary | ICD-10-CM

## 2021-01-26 NOTE — Telephone Encounter (Signed)
Per other phone note today: Flex sig will be upcoming and on hold until this sorted out. (Pt had x-ray at PCP office and ?SBO). Will await further from Roseanne Kaufman NP before scheduling Flex Sig.

## 2021-01-26 NOTE — Telephone Encounter (Signed)
FYIAngela Carey from [place] phoned to advise [place] that the pt saw Dr. Pleas Koch this morning for nausea and not be able to eat. So they did in house xray and the findings were ? SBO. So they sent the pt for a urgent CT scan. They just wanted [place] to know as well.

## 2021-01-26 NOTE — Telephone Encounter (Signed)
[facility] clinical pool:  Please arrange flex sig with Dr. Abbey Chatters to follow-up on rectal ulcer. Dr. Marcello Moores with [facility] is requesting this prior to colostomy takedown to ensure this has resolved. ASA 2

## 2021-01-26 NOTE — Telephone Encounter (Signed)
Noted! Flex sig will be upcoming and on hold until this sorted out.

## 2021-01-26 NOTE — Telephone Encounter (Signed)
noted 

## 2021-01-27 ENCOUNTER — Other Ambulatory Visit: Payer: Self-pay

## 2021-01-27 ENCOUNTER — Other Ambulatory Visit (HOSPITAL_COMMUNITY): Payer: Medicaid Other

## 2021-01-27 ENCOUNTER — Ambulatory Visit (HOSPITAL_COMMUNITY)
Admission: RE | Admit: 2021-01-27 | Discharge: 2021-01-27 | Disposition: A | Discharge status: Home / Self Care | Payer: Self-pay | Source: Ambulatory Visit | Attending: Family Medicine | Admitting: Family Medicine

## 2021-01-27 DIAGNOSIS — K56609 Unspecified intestinal obstruction, unspecified as to partial versus complete obstruction: Secondary | ICD-10-CM | POA: Insufficient documentation

## 2021-01-27 MED ORDER — IOHEXOL 9 MG/ML PO SOLN
ORAL | Status: AC
  Filled 2021-01-27: qty 1000

## 2021-02-01 IMAGING — DX DG CHEST 1V PORT
1 series · 1 of 1 positions shown · non-contrast
Comparison: 01/07/2020

CLINICAL DATA: Tachypnea.

EXAM:
PORTABLE CHEST 1 VIEW

[chest ap]
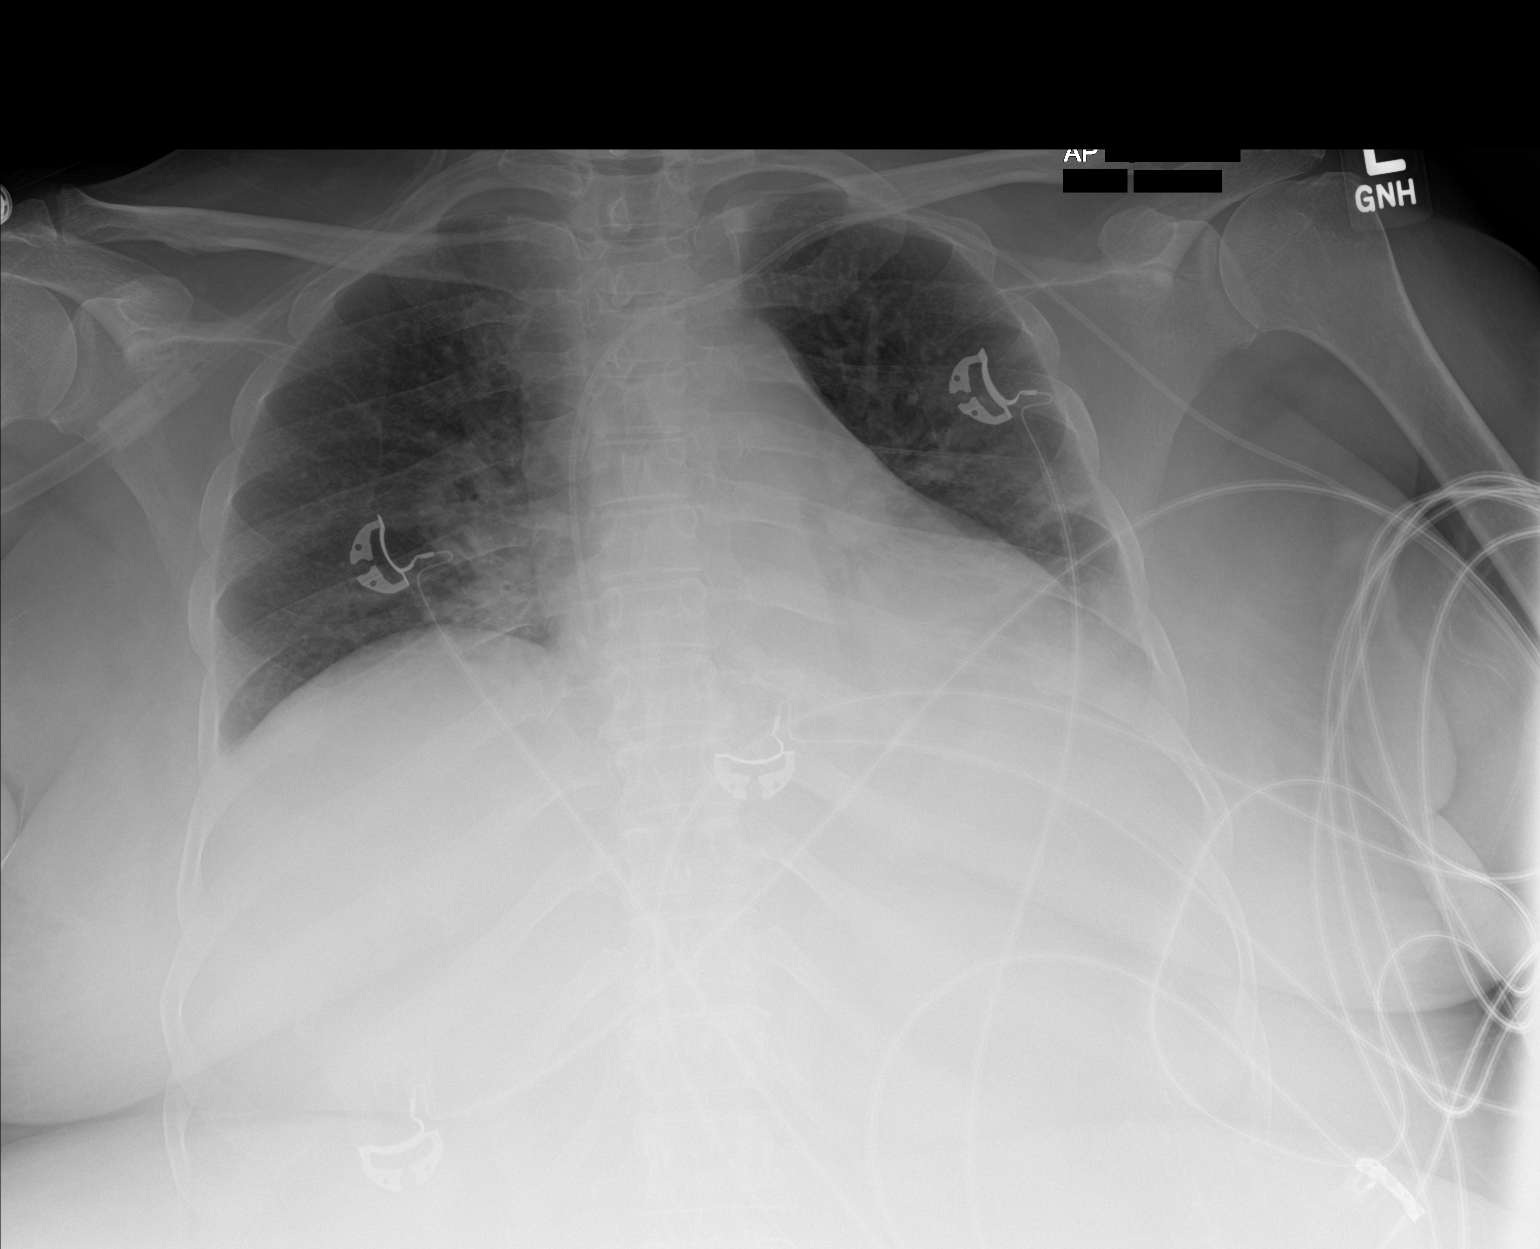

[1 of 1 positions shown; findings below may reference images not displayed]

FINDINGS: Left-sided PICC line has tip at the cavoatrial junction. Lungs are
hypoinflated with mild hazy opacification over the left
base/retrocardiac region which may be slightly worse and due to
small layering effusions/atelectasis. Right perihilar opacification
with possible cavitation unchanged. Cardiomediastinal silhouette and
remainder of the exam is unchanged.
IMPRESSION: 1. Mild hazy opacification over the left base/retrocardiac region
which may represent layering small effusion/atelectasis. Stable
opacification of the right hilar region with possible cavitation.
Chest CT would be helpful for better evaluation.

2.  Left-sided PICC line with tip at the cavoatrial junction.

## 2021-02-01 IMAGING — CT CT CHEST W/O CM
2 of 3 series · 15 of 36 positions shown, 18 images · non-contrast
Comparison: 01/10/2020

CLINICAL DATA: Shortness of breath, pleural effusion, abnormal
chest x-ray

EXAM:
CT CHEST WITHOUT CONTRAST
TECHNIQUE: Multidetector CT imaging of the chest was performed following the
standard protocol without IV contrast.

[Series 2: thorax · axial · 0.76mm/px · z∈[+1428,+1640]mm · 12 of 126 slices shown, 15 images]
[im 10/126  mediastinal]
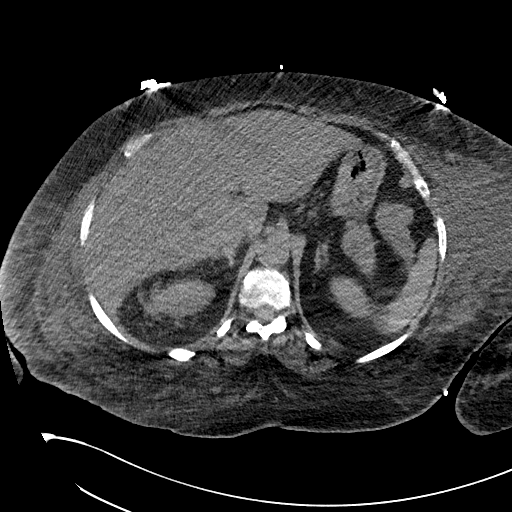
[im 10/126  lung]
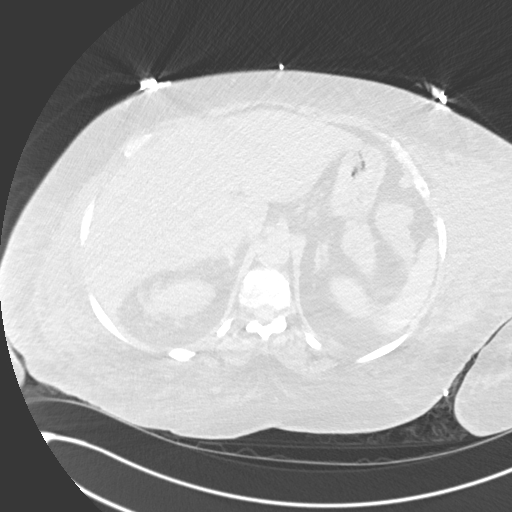
[im 19/126  lung]
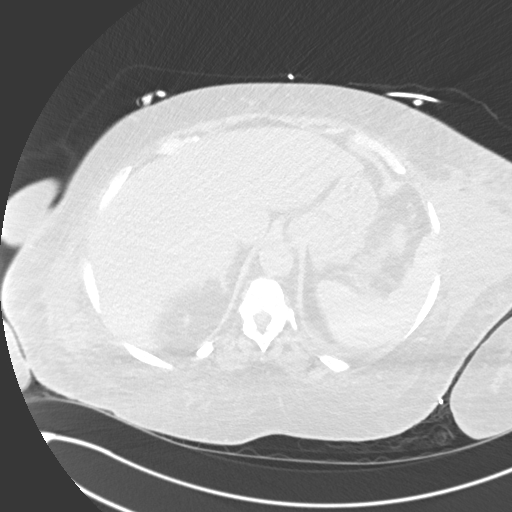
[im 28/126  lung]
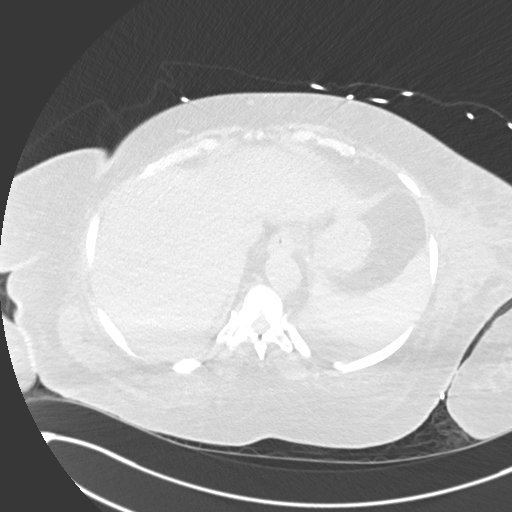
[im 38/126  lung]
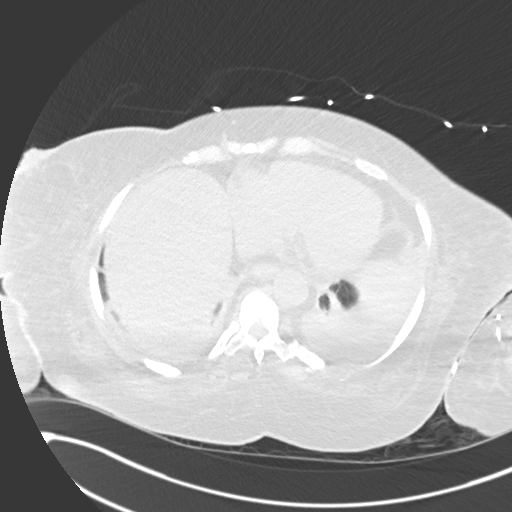
[im 47/126  mediastinal]
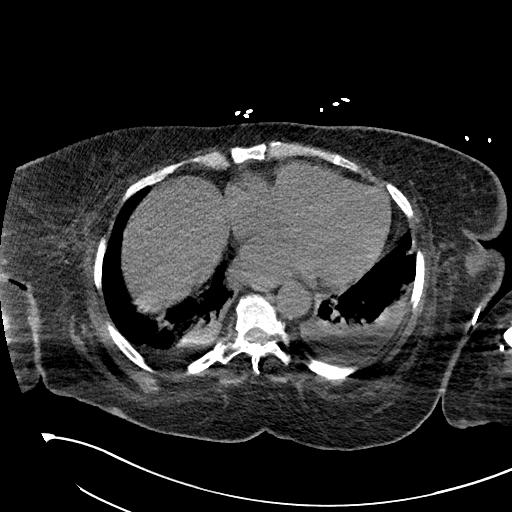
[im 47/126  lung]
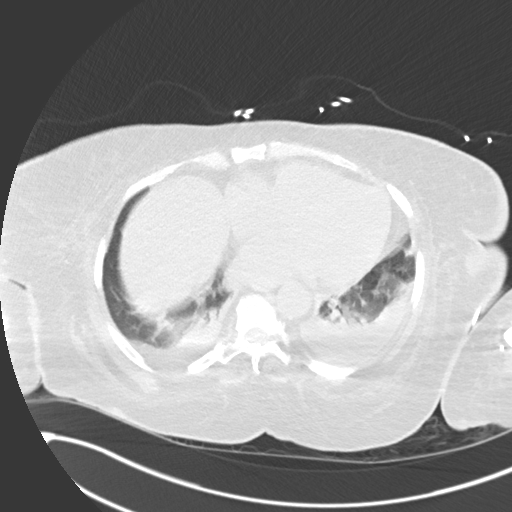
[im 56/126  lung]
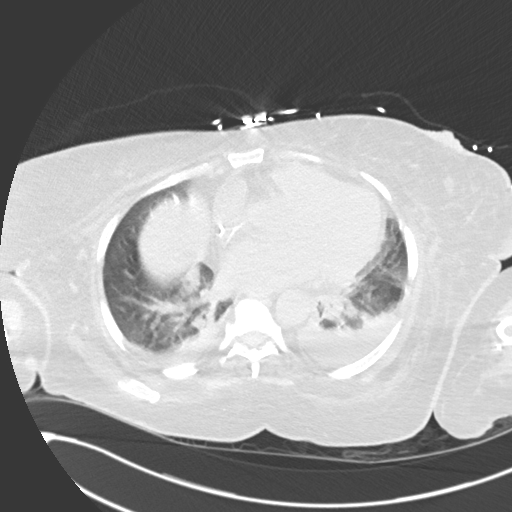
[im 70/126  lung]
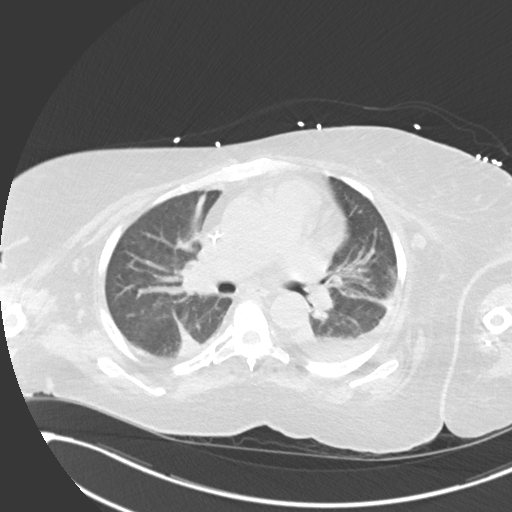
[im 79/126  lung]
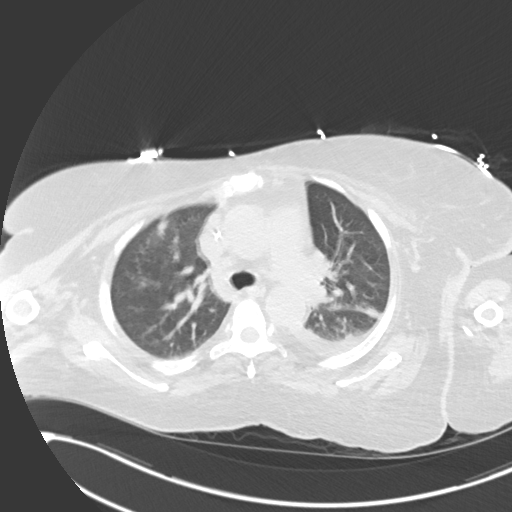
[im 88/126  mediastinal]
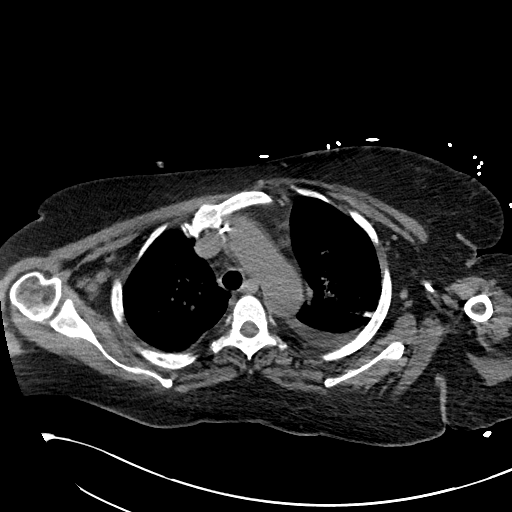
[im 88/126  lung]
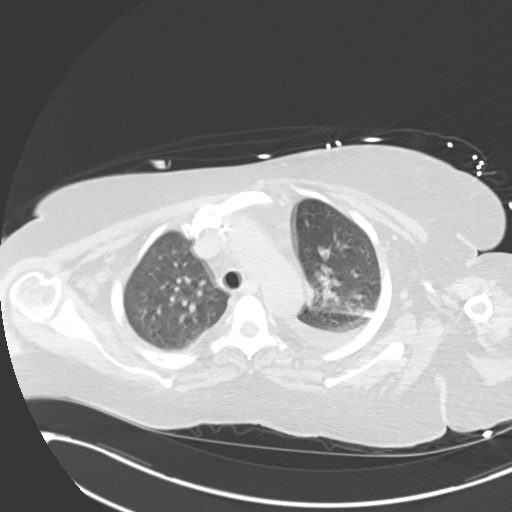
[im 98/126  lung]
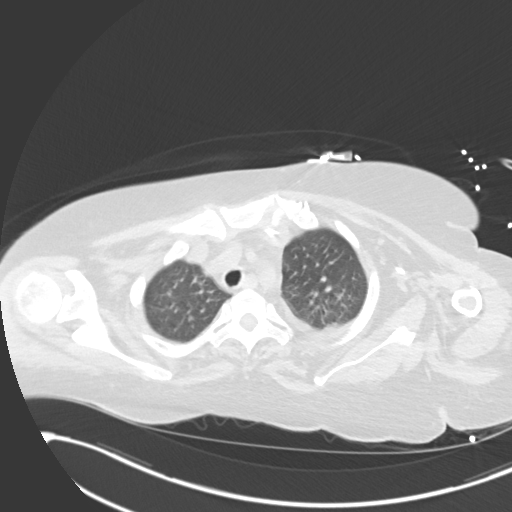
[im 107/126  lung]
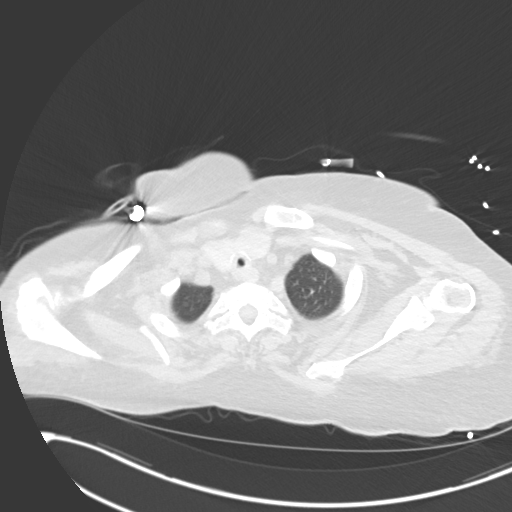
[im 116/126  lung]
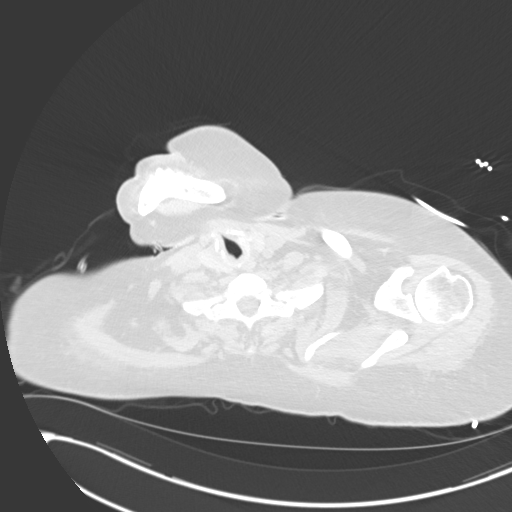

[Series 6: coronal · coronal · 0.50mm/px · 3 of 113 slices shown]
[im 23/113  lung]
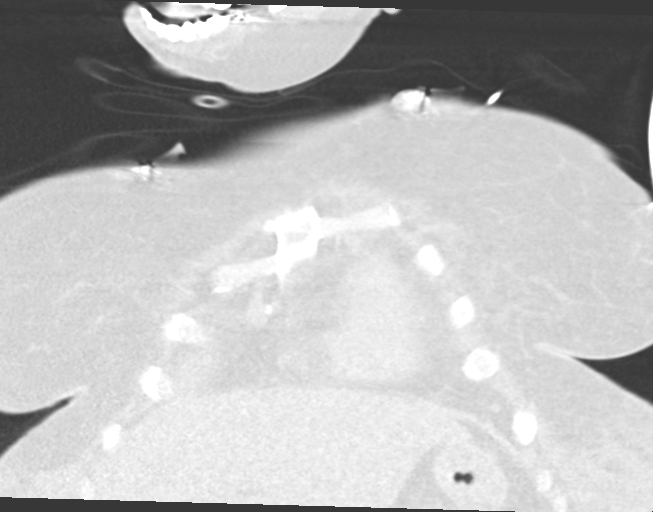
[im 45/113  lung]
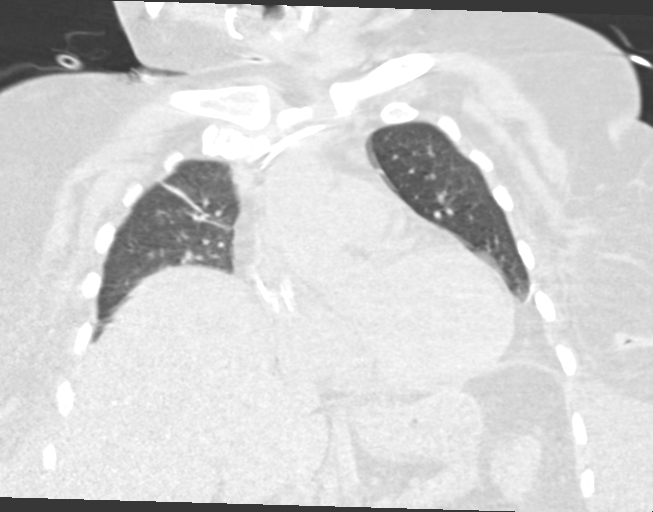
[im 68/113  lung]
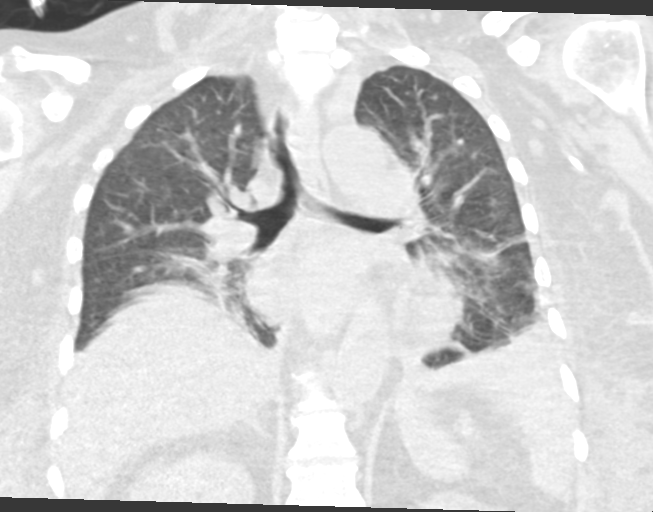

[15 of 36 positions shown; findings below may reference images not displayed]

FINDINGS: Cardiovascular: Unenhanced imaging of the heart demonstrates no
pericardial effusion. Normal caliber of the thoracic aorta. No
significant atherosclerosis. Left-sided PICC extends to the atrial
caval junction.

Mediastinum/Nodes: No enlarged mediastinal or axillary lymph nodes.
Thyroid gland and esophagus are grossly unremarkable.

There is circumferential narrowing of the subglottic airway, which
could reflect mucosal edema or stenosis related previous intubation.

Lungs/Pleura: Dependent atelectasis is identified within the lower
lobes, left greater than right. There are trace bilateral pleural
effusions, left greater than right. No acute airspace disease or
pneumothorax. The described abnormality at the right hilum on prior
chest x-ray likely reflects confluence of atelectasis and vascular
shadows.

Upper Abdomen: There is extensive body wall edema within the
bilateral flanks. No acute intra-abdominal process.

Musculoskeletal: No acute or destructive bony lesions. Reconstructed
images demonstrate no additional findings.
IMPRESSION: 1. Trace bilateral pleural effusions, left greater than right, with
bilateral dependent lower lobe atelectasis.
2. Circumferential narrowing of the subglottic airway, which could
reflect mucosal edema or stenosis related to previous intubation.

## 2021-02-03 ENCOUNTER — Telehealth: Payer: Self-pay | Admitting: Internal Medicine

## 2021-02-03 IMAGING — DX DG ABDOMEN 1V
1 series · 1 of 1 positions shown · non-contrast
Comparison: January 05, 2020.

CLINICAL DATA: Feeding tube placement.

EXAM:
ABDOMEN - 1 VIEW

[abdomen kub]
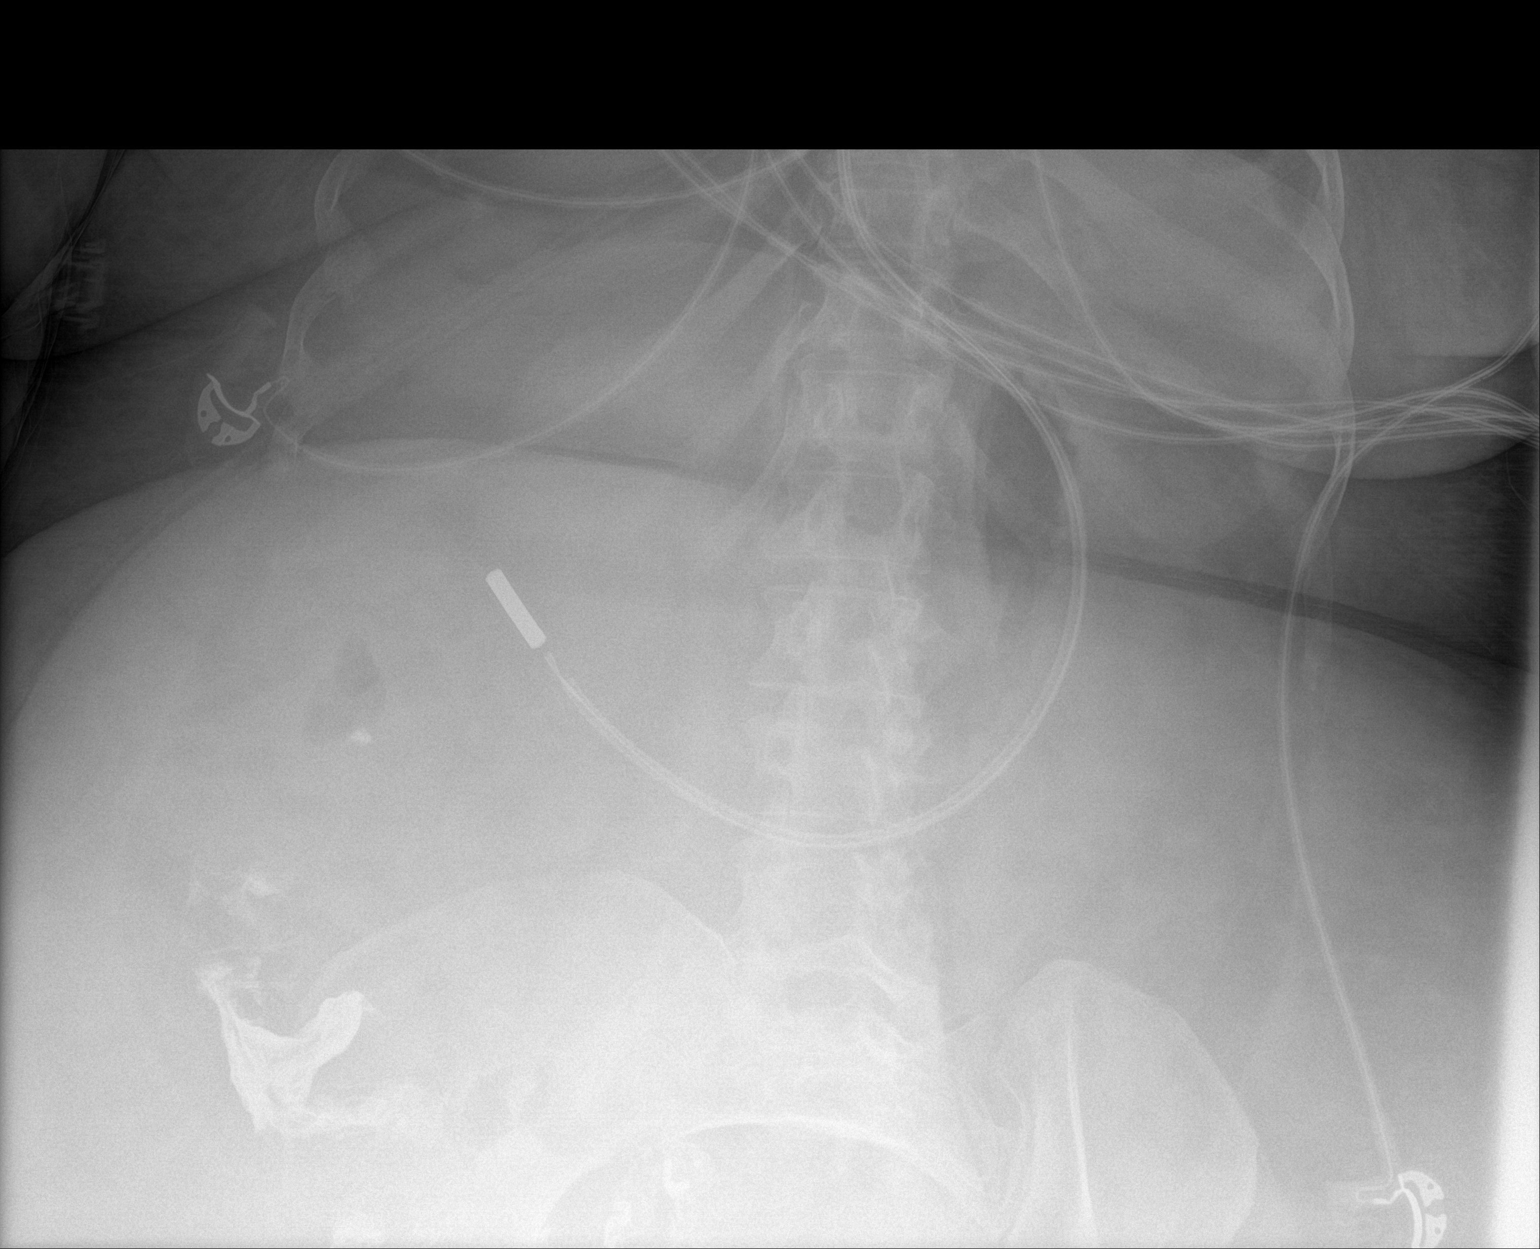

[1 of 1 positions shown; findings below may reference images not displayed]

FINDINGS: The bowel gas pattern is normal. Distal tip of feeding tube is seen
in expected position of distal stomach or proximal duodenum. No
radio-opaque calculi or other significant radiographic abnormality
are seen.
IMPRESSION: Distal tip of feeding tube seen in expected position of distal
stomach or proximal duodenum.

## 2021-02-03 NOTE — Telephone Encounter (Signed)
Returned the pt's call and LMOVM for the pt

## 2021-02-03 NOTE — Telephone Encounter (Signed)
Pt called asking for nurse, [place]. Call transferred to VM per nurse's request. Pt called back 5 minutes later asking to speak to Dr Ave Filter nurse. I told her that I just transferred her to VM and didn't she leave a message, she said no one calls her back when she leaves a voice mail. I told her that the nurse was with another patient and would have to call her back. [phone]

## 2021-02-07 NOTE — Telephone Encounter (Signed)
Phoned pt and LMOVM for the pt to return call

## 2021-02-08 ENCOUNTER — Telehealth: Payer: Self-pay

## 2021-02-08 DIAGNOSIS — Z933 Colostomy status: Secondary | ICD-10-CM

## 2021-02-08 DIAGNOSIS — K626 Ulcer of anus and rectum: Secondary | ICD-10-CM

## 2021-02-08 NOTE — Addendum Note (Signed)
Addended by: Hassan Rowan on: 02/08/2021 04:42 PM   Modules accepted: Orders

## 2021-02-08 NOTE — Telephone Encounter (Signed)
Letter mailed to the pt. 

## 2021-02-08 NOTE — Telephone Encounter (Signed)
Pt called today not very happy at all with the care she has been getting. Stating she is not understanding why she hasn't been able to have her surgery. States she has done everything we asked her too. Pt is stating if this office isn't going to do anything to help her get her surgery maybe she needs to look elsewhere. I advised the pt Roseanne Kaufman has been on vacation and Dr Abbey Chatters is only in the office a few days out the week then he is at hospital. She stated this was before then. So can someone please advise me regarding this pt so I can call her back today if possible.

## 2021-02-08 NOTE — Telephone Encounter (Signed)
Spoke to pt, flex sig w/propofol ASA 2 scheduled for 02/22/21 at 9:45am. Advised her to go to lab 02/18/21 for pregnancy test. Letter and procedure instructions mailed. Orders entered.

## 2021-02-08 NOTE — Telephone Encounter (Addendum)
I reviewed notes. See phone note dated 7/8 where flex sig was to be completed. However, we then received a phone call from PCP that there was concern for a partial SBO/SBO on outside films. CT without contrast was completed 7/14. We were awaiting findings of that prior to pursuing flex sig. No evidence of bowel obstruction.   I was on vacation starting afternoon of 7/14 and back today.    [facility] clinical pool: please arrange flex sig as previously planned.

## 2021-02-08 NOTE — Telephone Encounter (Signed)
Phoned and spoke with the pt and advised her that our office will be in contact with her to arrange a flex sig. Pt is ok with that

## 2021-02-18 ENCOUNTER — Other Ambulatory Visit (HOSPITAL_COMMUNITY)
Admission: RE | Admit: 2021-02-18 | Discharge: 2021-02-18 | Disposition: A | Discharge status: Home / Self Care | Payer: Medicaid Other | Source: Ambulatory Visit | Attending: Internal Medicine | Admitting: Internal Medicine

## 2021-02-18 DIAGNOSIS — K626 Ulcer of anus and rectum: Secondary | ICD-10-CM | POA: Insufficient documentation

## 2021-02-18 DIAGNOSIS — Z933 Colostomy status: Secondary | ICD-10-CM | POA: Insufficient documentation

## 2021-02-18 LAB — PREGNANCY, URINE: Preg Test, Ur: NEGATIVE

## 2021-02-22 ENCOUNTER — Encounter (HOSPITAL_COMMUNITY)
Admission: RE | Disposition: A | Discharge status: Home / Self Care | Payer: Self-pay | Source: Home / Self Care | Attending: Internal Medicine

## 2021-02-22 ENCOUNTER — Ambulatory Visit (HOSPITAL_COMMUNITY): Payer: Self-pay | Admitting: Anesthesiology

## 2021-02-22 ENCOUNTER — Ambulatory Visit (HOSPITAL_COMMUNITY)
Admission: RE | Admit: 2021-02-22 | Discharge: 2021-02-22 | Disposition: A | Discharge status: Home / Self Care | Payer: Self-pay | Attending: Internal Medicine | Admitting: Internal Medicine

## 2021-02-22 ENCOUNTER — Other Ambulatory Visit: Payer: Self-pay

## 2021-02-22 ENCOUNTER — Encounter (HOSPITAL_COMMUNITY): Payer: Self-pay

## 2021-02-22 DIAGNOSIS — Z8719 Personal history of other diseases of the digestive system: Secondary | ICD-10-CM | POA: Insufficient documentation

## 2021-02-22 DIAGNOSIS — Z8371 Family history of colonic polyps: Secondary | ICD-10-CM | POA: Insufficient documentation

## 2021-02-22 DIAGNOSIS — I10 Essential (primary) hypertension: Secondary | ICD-10-CM | POA: Insufficient documentation

## 2021-02-22 DIAGNOSIS — K529 Noninfective gastroenteritis and colitis, unspecified: Secondary | ICD-10-CM | POA: Insufficient documentation

## 2021-02-22 HISTORY — PX: COLONOSCOPY WITH PROPOFOL: SHX5780

## 2021-02-22 SURGERY — COLONOSCOPY WITH PROPOFOL
Anesthesia: General

## 2021-02-22 MED ORDER — LACTATED RINGERS IV SOLN: INTRAVENOUS | Status: DC

## 2021-02-22 MED ORDER — PROPOFOL 500 MG/50ML IV EMUL
INTRAVENOUS | Status: DC | PRN
  Administered 2021-02-22: 150 ug/kg/min via INTRAVENOUS

## 2021-02-22 MED ORDER — PROPOFOL 10 MG/ML IV BOLUS
INTRAVENOUS | Status: DC | PRN
  Administered 2021-02-22 (×2): 50 mg via INTRAVENOUS
  Administered 2021-02-22: 100 mg via INTRAVENOUS
  Administered 2021-02-22: 50 mg via INTRAVENOUS

## 2021-02-22 MED ORDER — ONDANSETRON HCL 4 MG PO TABS: 4.0000 mg | ORAL_TABLET | Freq: Three times a day (TID) | ORAL | 1 refills | Status: DC | PRN

## 2021-02-22 NOTE — Op Note (Signed)
[facility] Patient Name: Alyssa Carey Procedure Date: 02/22/2021 9:11 AM MRN: [id number] Date of Birth: June 08, 1971 Attending MD: Elon Alas. Abbey Chatters DO CSN: [id number] Age: 50 Admit Type: Outpatient Procedure:                Colonoscopy Indications:              Colitis, presumed infectious, History of colitis                            and rectal ulcer Providers:                Elon Alas. Abbey Chatters, DO, Gwenlyn Fudge, RN, Randa Spike, Technician Referring MD:              Medicines:                See the Anesthesia note for documentation of the                            administered medications Complications:            No immediate complications. Estimated Blood Loss:     Estimated blood loss was minimal. Procedure:                Pre-Anesthesia Assessment:                           - The anesthesia plan was to use monitored                            anesthesia care (MAC).                           After obtaining informed consent, the colonoscope                            was passed under direct vision. Throughout the                            procedure, the patient's blood pressure, pulse, and                            oxygen saturations were monitored continuously. The                            [id number]-[id number] ([id number]) scope was introduced through                            the descending colostomy and advanced to the 5-10                            cm into the ileum proximally and all the way to the                            anus distally.The colonoscopy was performed  without                            difficulty. The patient tolerated the procedure                            well. The quality of the bowel preparation was                            evaluated using the BBPS [facility] Bowel Preparation                            Scale) with scores of: Right Colon = 2 (minor                            amount of residual staining, small fragments  of                            stool and/or opaque liquid, but mucosa seen well),                            Transverse Colon = 2 (minor amount of residual                            staining, small fragments of stool and/or opaque                            liquid, but mucosa seen well) and Left Colon = 2                            (minor amount of residual staining, small fragments                            of stool and/or opaque liquid, but mucosa seen                            well). The total BBPS score equals 6. The quality                            of the bowel preparation was fair. Scope In: 9:30:36 AM Scope Out: 9:38:25 AM Total Procedure Duration: 0 hours 7 minutes 49 seconds  Findings:      The rectum appeared normal. Previously noted rectal ulcer has healed.       Mucosa was quite friable with evidence of barotrauma with evaluation of       the rectum itself. All air was suctioned out as scope was withdrawn       through the remaining colon. No active colitis. No polyps or evidence of       cancer identified. Impression:               - Preparation of the colon was fair.                           - The rectum is  normal.                           - No specimens collected.                           - The rectum appeared normal. Previously noted                            rectal ulcer has healed. Mucosa was quite friable                            with evidence of minor barotrauma with evaluation                            of the rectum itself. All air was suctioned out as                            scope was withdrawn through the remaining colon. No                            active colitis. No polyps or evidence of cancer                            identified. Moderate Sedation:      Per Anesthesia Care Recommendation:           - Patient has a contact number available for                            emergencies. The signs and symptoms of potential                             delayed complications were discussed with the                            patient. Return to normal activities tomorrow.                            Written discharge instructions were provided to the                            patient.                           - Resume previous diet.                           - Continue present medications.                           - Repeat colonoscopy in 1 year for surveillance                            after colostomy reversal. Procedure Code(s):        --- Professional ---  44388, Colonoscopy through stoma; diagnostic,                            including collection of specimen(s) by brushing or                            washing, when performed (separate procedure) Diagnosis Code(s):        --- Professional ---                           K52.9, Noninfective gastroenteritis and colitis,                            unspecified CPT copyright 2019 American Medical Association. All rights reserved. The codes documented in this report are preliminary and upon coder review may  be revised to meet current compliance requirements. Elon Alas. Abbey Chatters, DO [place] Abbey Chatters, DO 02/22/2021 9:53:59 AM This report has been signed electronically. Number of Addenda: 0

## 2021-02-22 NOTE — Discharge Instructions (Addendum)
Colonoscopy Discharge Instructions  Read the instructions outlined below and refer to this sheet in the next few weeks. These discharge instructions provide you with general information on caring for yourself after you leave the hospital. Your doctor may also give you specific instructions. While your treatment has been planned according to the most current medical practices available, unavoidable complications occasionally occur.   ACTIVITY You may resume your regular activity, but move at a slower pace for the next 24 hours.  Take frequent rest periods for the next 24 hours.  Walking will help get rid of the air and reduce the bloated feeling in your belly (abdomen).  No driving for 24 hours (because of the medicine (anesthesia) used during the test).   Do not sign any important legal documents or operate any machinery for 24 hours (because of the anesthesia used during the test).  NUTRITION Drink plenty of fluids.  You may resume your normal diet as instructed by your doctor.  Begin with a light meal and progress to your normal diet. Heavy or fried foods are harder to digest and may make you feel sick to your stomach (nauseated).  Avoid alcoholic beverages for 24 hours or as instructed.  MEDICATIONS You may resume your normal medications unless your doctor tells you otherwise.  WHAT YOU CAN EXPECT TODAY Some feelings of bloating in the abdomen.  Passage of more gas than usual.  Spotting of blood in your stool or on the toilet paper.  IF YOU HAD POLYPS REMOVED DURING THE COLONOSCOPY: No aspirin products for 7 days or as instructed.  No alcohol for 7 days or as instructed.  Eat a soft diet for the next 24 hours.  FINDING OUT THE RESULTS OF YOUR TEST Not all test results are available during your visit. If your test results are not back during the visit, make an appointment with your caregiver to find out the results. Do not assume everything is normal if you have not heard from your  caregiver or the medical facility. It is important for you to follow up on all of your test results.  SEEK IMMEDIATE MEDICAL ATTENTION IF: You have more than a spotting of blood in your stool.  Your belly is swollen (abdominal distention).  You are nauseated or vomiting.  You have a temperature over 101.  You have abdominal pain or discomfort that is severe or gets worse throughout the day.    Colonoscopy Discharge Instructions  Read the instructions outlined below and refer to this sheet in the next few weeks. These discharge instructions provide you with general information on caring for yourself after you leave the hospital. Your doctor may also give you specific instructions. While your treatment has been planned according to the most current medical practices available, unavoidable complications occasionally occur.   ACTIVITY You may resume your regular activity, but move at a slower pace for the next 24 hours.  Take frequent rest periods for the next 24 hours.  Walking will help get rid of the air and reduce the bloated feeling in your belly (abdomen).  No driving for 24 hours (because of the medicine (anesthesia) used during the test).   Do not sign any important legal documents or operate any machinery for 24 hours (because of the anesthesia used during the test).  NUTRITION Drink plenty of fluids.  You may resume your normal diet as instructed by your doctor.  Begin with a light meal and progress to your normal diet. Heavy or fried foods  are harder to digest and may make you feel sick to your stomach (nauseated).  Avoid alcoholic beverages for 24 hours or as instructed.  MEDICATIONS You may resume your normal medications unless your doctor tells you otherwise.  WHAT YOU CAN EXPECT TODAY Some feelings of bloating in the abdomen.  Passage of more gas than usual.  Spotting of blood in your stool or on the toilet paper.  IF YOU HAD POLYPS REMOVED DURING THE COLONOSCOPY: No  aspirin products for 7 days or as instructed.  No alcohol for 7 days or as instructed.  Eat a soft diet for the next 24 hours.  FINDING OUT THE RESULTS OF YOUR TEST Not all test results are available during your visit. If your test results are not back during the visit, make an appointment with your caregiver to find out the results. Do not assume everything is normal if you have not heard from your caregiver or the medical facility. It is important for you to follow up on all of your test results.  SEEK IMMEDIATE MEDICAL ATTENTION IF: You have more than a spotting of blood in your stool.  Your belly is swollen (abdominal distention).  You are nauseated or vomiting.  You have a temperature over 101.  You have abdominal pain or discomfort that is severe or gets worse throughout the day.   Your colon looked relatively healthy.  The previously noted rectal ulcer is healed.  We will send report to your surgeon.  From my standpoint I think it is okay to proceed with surgical reversal.  We will plan on repeat colonoscopy in approximately 1 to 2 years after surgery.  I will send you in Zofran to take as needed for nausea.  I hope you have a great rest of your week!  Elon Alas. Abbey Chatters, D.O. Gastroenterology and Hepatology [facility]

## 2021-02-22 NOTE — Transfer of Care (Signed)
Immediate Anesthesia Transfer of Care Note  Patient: Alyssa Carey  Procedure(s) Performed: COLONOSCOPY WITH PROPOFOL  Patient Location: Endoscopy Unit  Anesthesia Type:General  Level of Consciousness: awake  Airway & Oxygen Therapy: Patient Spontanous Breathing  Post-op Assessment: Report given to RN and Post -op Vital signs reviewed and stable  Post vital signs: Reviewed and stable  Last Vitals:  Vitals Value Taken Time  BP    Temp    Pulse    Resp    SpO2      Last Pain:  Vitals:   02/22/21 0922  TempSrc:   PainSc: 0-No pain      Patients Stated Pain Goal: 6 (38/32/91 [id number])  Complications: No notable events documented.

## 2021-02-22 NOTE — H&P (Signed)
Primary Care Physician:  [facility] Primary Gastroenterologist:  Dr. Abbey Chatters  Pre-Procedure History & Physical: HPI:  Alyssa Carey is a 50 y.o. female is here for a flexible sigmoidoscopy due to history of rectal ulcer.  Past Medical History:  Diagnosis Date   HTN (hypertension) 10/05/2020   Necrotizing fasciitis [facility]     Past Surgical History:  Procedure Laterality Date   BIOPSY  10/07/2020   Procedure: BIOPSY;  Surgeon: Daneil Dolin, MD;  Location: [facility];  Service: Endoscopy;;   COLONOSCOPY WITH PROPOFOL N/A 10/07/2020   single healing rectal ulcer in distal rectum with surrounding mucosal friability and markedly abnormal proximal colon with ulceration s/p biopsies.   ESOPHAGOGASTRODUODENOSCOPY (EGD) WITH PROPOFOL N/A 10/07/2020   non-bleeding gastric ulcer . Pathology with H.pylori negative   ESOPHAGOGASTRODUODENOSCOPY (EGD) WITH PROPOFOL N/A 12/27/2020   Procedure: ESOPHAGOGASTRODUODENOSCOPY (EGD) WITH PROPOFOL;  Surgeon: [name], DO;  Location: [facility];  Service: Endoscopy;  Laterality: N/A;  1:00pm   FLEXIBLE SIGMOIDOSCOPY N/A 01/11/2020   Procedure: FLEXIBLE SIGMOIDOSCOPY;  Surgeon: Carol Ada, MD;  Location: [facility]OS[facility];  Service: Gastroenterology;  Laterality: N/A;   INCISION AND DRAINAGE PERIRECTAL ABSCESS N/A 12/25/2019   Procedure: IRRIGATION AND DEBRIDEMENT BUTTOCKS, LAP LOOP COLOSTOMY;  Surgeon: Greer Pickerel, MD;  Location: [facility] ORS;  Service: General;  Laterality: N/A;   IRRIGATION AND DEBRIDEMENT ABSCESS N/A 12/23/2019   Procedure: EXCISION AND DEBRIDEMENT LEFT BUTTOCK AND PERINEUM;  Surgeon: Clovis Riley, MD;  Location: [facility];  Service: General;  Laterality: N/A;   LAPAROSCOPIC LOOP COLOSTOMY N/A 12/25/2019   Procedure: LAPAROSCOPIC LOOP COLOSTOMY;  Surgeon: Greer Pickerel, MD;  Location: [facility];  Service: General;  Laterality: N/A;    Prior to Admission medications   Medication Sig Start Date End Date Taking? Authorizing  Provider  Nutritional Supplements (BOOST PO) Take 1 Bottle by mouth 4 (four) times daily.   Yes [provider]  vitamin C (ASCORBIC ACID) 500 MG tablet Take 500 mg by mouth daily.   Yes [provider]  pantoprazole (PROTONIX) 40 MG tablet Take 1 tablet (40 mg total) by mouth daily. Patient not taking: Reported on 02/17/2021 12/27/20   [name], DO    Allergies as of 02/08/2021 - Review Complete 12/30/2020  Allergen Reaction Noted   Penicillins  12/21/2019   Oxycodone hcl Rash 10/06/2020    Family History  Problem Relation Age of Onset   Colon polyps Mother        does not believe adenomas   Colon cancer Neg Hx     Social History   Socioeconomic History   Marital status: Single    Spouse name: Not on file   Number of children: 1   Years of education: Not on file   Highest education level: Not on file  Occupational History   Occupation: unknown  Tobacco Use   Smoking status: Never   Smokeless tobacco: Never  Vaping Use   Vaping Use: Never used  Substance and Sexual Activity   Alcohol use: Not Currently   Drug use: Not Currently   Sexual activity: Not Currently  Other Topics Concern   Not on file  Social History Narrative   Patient found in hotel room; unsure of living situation prior.   Social Determinants of Health   Financial Resource Strain: Not on file  Food Insecurity: Not on file  Transportation Needs: Not on file  Physical Activity: Not on file  Stress: Not on file  Social  Connections: Not on file  Intimate Partner Violence: Not on file    Review of Systems: See HPI, otherwise negative ROS  Physical Exam: Vital signs in last 24 hours: Temp:  [98 F (36.7 C)] 98 F (36.7 C) (08/09 0819) Pulse Rate:  [83] 83 (08/09 0819) Resp:  [16] 16 (08/09 0819) BP: (104)/(80) 104/80 (08/09 0819) SpO2:  [99 %] 99 % (08/09 0819) Weight:  [67.6 kg] 67.6 kg (08/09 0819)   General:   Alert,  Well-developed, well-nourished, pleasant and  cooperative in NAD Head:  Normocephalic and atraumatic. Eyes:  Sclera clear, no icterus.   Conjunctiva pink. Ears:  Normal auditory acuity. Nose:  No deformity, discharge,  or lesions. Mouth:  No deformity or lesions, dentition normal. Neck:  Supple; no masses or thyromegaly. Lungs:  Clear throughout to auscultation.   No wheezes, crackles, or rhonchi. No acute distress. Heart:  Regular rate and rhythm; no murmurs, clicks, rubs,  or gallops. Abdomen:  Soft, nontender and nondistended. No masses, hepatosplenomegaly or hernias noted. Normal bowel sounds, without guarding, and without rebound.   Msk:  Symmetrical without gross deformities. Normal posture. Extremities:  Without clubbing or edema. Neurologic:  Alert and  oriented x4;  grossly normal neurologically. Skin:  Intact without significant lesions or rashes. Cervical Nodes:  No significant cervical adenopathy. Psych:  Alert and cooperative. Normal mood and affect.  Impression/Plan: Alyssa Carey is here for a flexible sigmoidoscopy due to history of rectal ulcer.  The risks of the procedure including infection, bleed, or perforation as well as benefits, limitations, alternatives and imponderables have been reviewed with the patient. Questions have been answered. All parties agreeable.

## 2021-02-22 NOTE — Anesthesia Preprocedure Evaluation (Signed)
Anesthesia Evaluation  Patient identified by MRN, date of birth, ID band Patient awake    Reviewed: Allergy & Precautions, H&P , NPO status , Patient's Chart, lab work & pertinent test results, reviewed documented beta blocker date and time   Airway Mallampati: II  TM Distance: >3 FB Neck ROM: full    Dental no notable dental hx. (+) Teeth Intact   Pulmonary neg pulmonary ROS, Patient abstained from smoking.,    Pulmonary exam normal breath sounds clear to auscultation       Cardiovascular Exercise Tolerance: Good hypertension, negative cardio ROS   Rhythm:regular Rate:Normal     Neuro/Psych PSYCHIATRIC DISORDERS negative neurological ROS     GI/Hepatic Neg liver ROS, PUD,   Endo/Other  negative endocrine ROS  Renal/GU Renal disease  negative genitourinary   Musculoskeletal   Abdominal   Peds  Hematology  (+) Blood dyscrasia, anemia ,   Anesthesia Other Findings Alcoholic liver disease  Paralysis of right vocal cord  Reproductive/Obstetrics negative OB ROS                             Anesthesia Physical  Anesthesia Plan  ASA: 2  Anesthesia Plan: General   Post-op Pain Management:    Induction:   PONV Risk Score and Plan: Propofol infusion  Airway Management Planned: Nasal Cannula, Simple Face Mask and Natural Airway  Additional Equipment:   Intra-op Plan:   Post-operative Plan:   Informed Consent: I have reviewed the patients History and Physical, chart, labs and discussed the procedure including the risks, benefits and alternatives for the proposed anesthesia with the patient or authorized representative who has indicated his/her understanding and acceptance.       Plan Discussed with: CRNA  Anesthesia Plan Comments:         Anesthesia Quick Evaluation

## 2021-02-22 NOTE — Anesthesia Postprocedure Evaluation (Signed)
Anesthesia Post Note  Patient: Alyssa Carey  Procedure(s) Performed: COLONOSCOPY WITH PROPOFOL  Patient location during evaluation: PACU Anesthesia Type: General Level of consciousness: awake and alert Pain management: pain level controlled Vital Signs Assessment: post-procedure vital signs reviewed and stable Respiratory status: spontaneous breathing, nonlabored ventilation, respiratory function stable and patient connected to nasal cannula oxygen Cardiovascular status: blood pressure returned to baseline and stable Postop Assessment: no apparent nausea or vomiting Anesthetic complications: no   No notable events documented.   Last Vitals:  Vitals:   02/22/21 0819 02/22/21 0943  BP: 104/80 99/73  Pulse: 83   Resp: 16 (!) 23  Temp: 36.7 C 36.6 C  SpO2: 99%     Last Pain:  Vitals:   02/22/21 0943  TempSrc: Oral  PainSc: 0-No pain                 Nicanor Alcon

## 2021-03-02 ENCOUNTER — Encounter (HOSPITAL_COMMUNITY): Payer: Self-pay | Admitting: Internal Medicine

## 2021-03-08 ENCOUNTER — Telehealth: Payer: Self-pay | Admitting: Internal Medicine

## 2021-03-08 NOTE — Telephone Encounter (Signed)
Patient called asking if Dr. Abbey Chatters would give her a prescription for something to increase her appetite

## 2021-03-09 NOTE — Telephone Encounter (Signed)
We do not typically prescribe that. Needs to obtain from PCP.

## 2021-03-09 NOTE — Telephone Encounter (Signed)
The pt is asking that something be sent in to increase her appetite. She originally asked for Dr. Abbey Chatters but he has not seen this pt in office.

## 2021-03-09 NOTE — Telephone Encounter (Signed)
Noted  Phoned the pt and LMOVM .

## 2021-03-09 NOTE — Telephone Encounter (Signed)
Pt returned call and was advised that we do not prescribed medications of that type and she needs to speak with her PCP regarding this. Pt expressed understanding.

## 2021-03-23 ENCOUNTER — Other Ambulatory Visit: Payer: Self-pay

## 2021-03-23 ENCOUNTER — Inpatient Hospital Stay (HOSPITAL_COMMUNITY)
Admission: EM | Admit: 2021-03-23 | Discharge: 2021-03-29 | DRG: 433 | Disposition: A | Discharge status: Home / Self Care | Payer: Self-pay | Attending: Internal Medicine | Admitting: Internal Medicine

## 2021-03-23 ENCOUNTER — Encounter (HOSPITAL_COMMUNITY): Payer: Self-pay

## 2021-03-23 ENCOUNTER — Emergency Department (HOSPITAL_COMMUNITY): Payer: Self-pay

## 2021-03-23 DIAGNOSIS — Z8739 Personal history of other diseases of the musculoskeletal system and connective tissue: Secondary | ICD-10-CM

## 2021-03-23 DIAGNOSIS — Z20822 Contact with and (suspected) exposure to covid-19: Secondary | ICD-10-CM | POA: Diagnosis present

## 2021-03-23 DIAGNOSIS — K435 Parastomal hernia without obstruction or  gangrene: Secondary | ICD-10-CM | POA: Diagnosis present

## 2021-03-23 DIAGNOSIS — R748 Abnormal levels of other serum enzymes: Secondary | ICD-10-CM | POA: Insufficient documentation

## 2021-03-23 DIAGNOSIS — E669 Obesity, unspecified: Secondary | ICD-10-CM | POA: Diagnosis present

## 2021-03-23 DIAGNOSIS — F1020 Alcohol dependence, uncomplicated: Secondary | ICD-10-CM | POA: Diagnosis present

## 2021-03-23 DIAGNOSIS — R9431 Abnormal electrocardiogram [ECG] [EKG]: Secondary | ICD-10-CM | POA: Diagnosis present

## 2021-03-23 DIAGNOSIS — E876 Hypokalemia: Secondary | ICD-10-CM | POA: Diagnosis present

## 2021-03-23 DIAGNOSIS — Z933 Colostomy status: Secondary | ICD-10-CM

## 2021-03-23 DIAGNOSIS — K7010 Alcoholic hepatitis without ascites: Principal | ICD-10-CM | POA: Diagnosis present

## 2021-03-23 DIAGNOSIS — Z8371 Family history of colonic polyps: Secondary | ICD-10-CM

## 2021-03-23 DIAGNOSIS — E871 Hypo-osmolality and hyponatremia: Secondary | ICD-10-CM | POA: Diagnosis present

## 2021-03-23 DIAGNOSIS — D649 Anemia, unspecified: Secondary | ICD-10-CM

## 2021-03-23 DIAGNOSIS — R296 Repeated falls: Secondary | ICD-10-CM

## 2021-03-23 DIAGNOSIS — R531 Weakness: Secondary | ICD-10-CM

## 2021-03-23 DIAGNOSIS — Y92009 Unspecified place in unspecified non-institutional (private) residence as the place of occurrence of the external cause: Secondary | ICD-10-CM

## 2021-03-23 DIAGNOSIS — E440 Moderate protein-calorie malnutrition: Secondary | ICD-10-CM | POA: Diagnosis present

## 2021-03-23 DIAGNOSIS — E162 Hypoglycemia, unspecified: Secondary | ICD-10-CM | POA: Diagnosis present

## 2021-03-23 DIAGNOSIS — Z7141 Alcohol abuse counseling and surveillance of alcoholic: Secondary | ICD-10-CM

## 2021-03-23 DIAGNOSIS — E46 Unspecified protein-calorie malnutrition: Secondary | ICD-10-CM

## 2021-03-23 DIAGNOSIS — W19XXXA Unspecified fall, initial encounter: Secondary | ICD-10-CM | POA: Diagnosis present

## 2021-03-23 DIAGNOSIS — E8809 Other disorders of plasma-protein metabolism, not elsewhere classified: Secondary | ICD-10-CM | POA: Diagnosis present

## 2021-03-23 DIAGNOSIS — R6510 Systemic inflammatory response syndrome (SIRS) of non-infectious origin without acute organ dysfunction: Secondary | ICD-10-CM | POA: Diagnosis present

## 2021-03-23 DIAGNOSIS — F32A Depression, unspecified: Secondary | ICD-10-CM | POA: Diagnosis present

## 2021-03-23 DIAGNOSIS — D539 Nutritional anemia, unspecified: Secondary | ICD-10-CM | POA: Diagnosis present

## 2021-03-23 DIAGNOSIS — Z885 Allergy status to narcotic agent status: Secondary | ICD-10-CM

## 2021-03-23 DIAGNOSIS — R17 Unspecified jaundice: Secondary | ICD-10-CM

## 2021-03-23 DIAGNOSIS — Y907 Blood alcohol level of 200-239 mg/100 ml: Secondary | ICD-10-CM | POA: Diagnosis present

## 2021-03-23 DIAGNOSIS — Z6834 Body mass index (BMI) 34.0-34.9, adult: Secondary | ICD-10-CM

## 2021-03-23 DIAGNOSIS — R7401 Elevation of levels of liver transaminase levels: Secondary | ICD-10-CM | POA: Diagnosis present

## 2021-03-23 DIAGNOSIS — Z880 Allergy status to penicillin: Secondary | ICD-10-CM

## 2021-03-23 DIAGNOSIS — Z8711 Personal history of peptic ulcer disease: Secondary | ICD-10-CM

## 2021-03-23 DIAGNOSIS — Z789 Other specified health status: Secondary | ICD-10-CM

## 2021-03-23 DIAGNOSIS — F1090 Alcohol use, unspecified, uncomplicated: Secondary | ICD-10-CM

## 2021-03-23 DIAGNOSIS — I10 Essential (primary) hypertension: Secondary | ICD-10-CM | POA: Diagnosis present

## 2021-03-23 DIAGNOSIS — B179 Acute viral hepatitis, unspecified: Secondary | ICD-10-CM

## 2021-03-23 DIAGNOSIS — R7989 Other specified abnormal findings of blood chemistry: Secondary | ICD-10-CM

## 2021-03-23 DIAGNOSIS — Z888 Allergy status to other drugs, medicaments and biological substances status: Secondary | ICD-10-CM

## 2021-03-23 DIAGNOSIS — Z7289 Other problems related to lifestyle: Secondary | ICD-10-CM

## 2021-03-23 LAB — COMPREHENSIVE METABOLIC PANEL
ALT: 63 U/L — ABNORMAL HIGH (ref 0–44)
AST: 235 U/L — ABNORMAL HIGH (ref 15–41)
Albumin: 2.4 g/dL — ABNORMAL LOW (ref 3.5–5.0)
Alkaline Phosphatase: 356 U/L — ABNORMAL HIGH (ref 38–126)
Anion gap: 16 — ABNORMAL HIGH (ref 5–15)
BUN: <5 mg/dL — ABNORMAL LOW (ref 6–20)
CO2: 20 mmol/L — ABNORMAL LOW (ref 22–32)
Calcium: 8.6 mg/dL — ABNORMAL LOW (ref 8.9–10.3)
Chloride: 97 mmol/L — ABNORMAL LOW (ref 98–111)
Creatinine, Ser: <0.3 mg/dL — ABNORMAL LOW (ref 0.44–1.00)
Glucose, Bld: 66 mg/dL — ABNORMAL LOW (ref 70–99)
Potassium: 3.1 mmol/L — ABNORMAL LOW (ref 3.5–5.1)
Sodium: 133 mmol/L — ABNORMAL LOW (ref 135–145)
Total Bilirubin: 15.4 mg/dL — ABNORMAL HIGH (ref 0.3–1.2)
Total Protein: 7.5 g/dL (ref 6.5–8.1)

## 2021-03-23 LAB — HEPATITIS PANEL, ACUTE
HCV Ab: NONREACTIVE
Hep A IgM: NONREACTIVE
Hep B C IgM: NONREACTIVE
Hepatitis B Surface Ag: NONREACTIVE

## 2021-03-23 LAB — CBC WITH DIFFERENTIAL/PLATELET
Abs Immature Granulocytes: 0.05 10*3/uL (ref 0.00–0.07)
Basophils Absolute: 0.1 10*3/uL (ref 0.0–0.1)
Basophils Relative: 1 %
Eosinophils Absolute: 0 10*3/uL (ref 0.0–0.5)
Eosinophils Relative: 0 %
HCT: 27.8 % — ABNORMAL LOW (ref 36.0–46.0)
Hemoglobin: 9.5 g/dL — ABNORMAL LOW (ref 12.0–15.0)
Immature Granulocytes: 1 %
Lymphocytes Relative: 14 %
Lymphs Abs: 1 10*3/uL (ref 0.7–4.0)
MCH: 33.2 pg (ref 26.0–34.0)
MCHC: 34.2 g/dL (ref 30.0–36.0)
MCV: 97.2 fL (ref 80.0–100.0)
Monocytes Absolute: 0.3 10*3/uL (ref 0.1–1.0)
Monocytes Relative: 4 %
Neutro Abs: 6 10*3/uL (ref 1.7–7.7)
Neutrophils Relative %: 80 %
Platelets: 183 10*3/uL (ref 150–400)
RBC: 2.86 MIL/uL — ABNORMAL LOW (ref 3.87–5.11)
RDW: 15.6 % — ABNORMAL HIGH (ref 11.5–15.5)
WBC: 7.5 10*3/uL (ref 4.0–10.5)
nRBC: 0 % (ref 0.0–0.2)

## 2021-03-23 LAB — LIPASE, BLOOD: Lipase: 32 U/L (ref 11–51)

## 2021-03-23 LAB — RAPID URINE DRUG SCREEN, HOSP PERFORMED
Amphetamines: NOT DETECTED
Barbiturates: NOT DETECTED
Benzodiazepines: NOT DETECTED
Cocaine: NOT DETECTED
Opiates: NOT DETECTED
Tetrahydrocannabinol: NOT DETECTED

## 2021-03-23 LAB — ETHANOL: Alcohol, Ethyl (B): 205 mg/dL — ABNORMAL HIGH (ref <10)

## 2021-03-23 LAB — URINALYSIS, ROUTINE W REFLEX MICROSCOPIC
Glucose, UA: 100 mg/dL — AB
Hgb urine dipstick: NEGATIVE
Ketones, ur: 15 mg/dL — AB
Nitrite: POSITIVE — AB
Protein, ur: 30 mg/dL — AB
Specific Gravity, Urine: 1.025 (ref 1.005–1.030)
pH: 6.5 (ref 5.0–8.0)

## 2021-03-23 LAB — URINALYSIS, MICROSCOPIC (REFLEX): RBC / HPF: NONE SEEN RBC/hpf (ref 0–5)

## 2021-03-23 LAB — PREGNANCY, URINE: Preg Test, Ur: NEGATIVE

## 2021-03-23 LAB — RESP PANEL BY RT-PCR (FLU A&B, COVID) ARPGX2
Influenza A by PCR: NEGATIVE
Influenza B by PCR: NEGATIVE
SARS Coronavirus 2 by RT PCR: NEGATIVE

## 2021-03-23 LAB — ACETAMINOPHEN LEVEL: Acetaminophen (Tylenol), Serum: <10 ug/mL — ABNORMAL LOW (ref 10–30)

## 2021-03-23 LAB — PROTIME-INR
INR: 1.2 (ref 0.8–1.2)
Prothrombin Time: 15.3 seconds — ABNORMAL HIGH (ref 11.4–15.2)

## 2021-03-23 MED ORDER — POTASSIUM CHLORIDE CRYS ER 20 MEQ PO TBCR
20.0000 meq | EXTENDED_RELEASE_TABLET | Freq: Once | ORAL | Status: AC
  Administered 2021-03-23: 20 meq via ORAL
  Filled 2021-03-23: qty 1

## 2021-03-23 MED ORDER — SODIUM CHLORIDE 0.9 % IV BOLUS
1000.0000 mL | Freq: Once | INTRAVENOUS | Status: AC
  Administered 2021-03-23: 1000 mL via INTRAVENOUS

## 2021-03-23 MED ORDER — IOHEXOL 350 MG/ML SOLN
80.0000 mL | Freq: Once | INTRAVENOUS | Status: AC | PRN
  Administered 2021-03-23: 80 mL via INTRAVENOUS

## 2021-03-23 MED ORDER — HEPARIN SODIUM (PORCINE) 5000 UNIT/ML IJ SOLN
5000.0000 [IU] | Freq: Three times a day (TID) | INTRAMUSCULAR | Status: DC
  Administered 2021-03-24: 5000 [IU] via SUBCUTANEOUS
  Filled 2021-03-23 (×2): qty 1

## 2021-03-23 NOTE — ED Provider Notes (Signed)
Emergency Medicine Provider Triage Evaluation Note  Alyssa Carey , a 50 y.o. female  was evaluated in triage.  Pt complains of weakness unable to eat for last 3 weeks, states that she has had falls most recent was 1 week ago, states that she can no longer eat she becomes nauseous when she does so.  She also notes that she is seeing some blood in her stool coming from her ileostomy, she has some right upper quadrant tenderness does not radiate, no other complaints at this time.  Review of Systems  Positive: Generalized stomach pain, weakness Negative: Constipation, chest pain, shortness of breath  Physical Exam  BP (!) 85/65 (BP Location: Right Arm)   Pulse 96   Temp 98.2 F (36.8 C)   Resp 18   Ht 4' 9"  (1.448 m)   Wt 64.4 kg   SpO2 98%   BMI 30.73 kg/m  Gen:   Awake, no distress , jaundiced appearing Resp:  Normal effort  MSK:   Moves extremities without difficulty  Other:  Abdomen is nondistended normal bowel sounds, tender to palpation right upper quadrant, no guarding, rebound tenderness, peritoneal sign.  Medical Decision Making  Medically screening exam initiated at 5:03 PM.  Appropriate orders placed.  Alyssa Carey was informed that the remainder of the evaluation will be completed by another provider, this initial triage assessment does not replace that evaluation, and the importance of remaining in the ED until their evaluation is complete.  Patient presents with generalized weakness and right upper quadrant tenderness, she is jaundiced appearing, concern for acute liver failure, recommend immediate rooming at this time will order lab work as patient need further work-up.   Marcello Fennel, PA-C 03/23/21 1705    Hayden Rasmussen, MD 03/24/21 1032

## 2021-03-23 NOTE — H&P (Signed)
History and Physical  Alyssa Carey TIR:[id number] DOB: [birth year] DOA: 03/23/2021  Referring physician: Hayden Rasmussen, MD PCP: [facility]  Patient coming from: Home  Chief Complaint: Generalized weakness  HPI: Alyssa Carey is a 50 y.o. female with medical history significant for hypertension, necrotizing fasciitis requiring colostomy, alcohol dependence who presents to the emergency department due to 1 month history of weakness and frequent falls, she complained of decreased appetite with a loss of more than 20 pounds within the last 6 weeks.  She complained of intermittent pain around the colostomy and endorsed seeing some blood in colostomy bag yesterday.  Patient states that she drinks wine occasionally and that last alcohol intake was about a month ago.  She complained of nausea but no vomiting.  She denies fever, chills, chest pain, shortness of breath or IV drug use.  ED Course:  In the emergency department, she was tachypneic and tachycardic, but other vital signs were within normal range.  Work-up in the ED showed normocytic anemia, hypokalemia, hyponatremia, hypoglycemia hypoalbuminemia, elevated liver enzymes (AST 235, ALT 63, ALP 356), alcohol level 205, hepatitis panel was nonreactive, urinalysis was 24 large bilirubin, nitrite was positive with trace leukocytes, but patient denies any irritative bladder symptoms, urine drug screen was negative, lipase 32, acetaminophen level was < 10.  PT 15.3, INR 1.2.  Influenza A, B, SARS coronavirus 2 was negative. CT abdomen and pelvis with contrast showed left lower quadrant ileostomy with large parastomal hernia containing nondilated bowel loops. There is no evidence for bowel obstruction. There is mild nonspecific mesenteric edema diffusely. 2. Hepatomegaly and hepatic steatosis changes are similar to the prior study.  3.  Cholelithiasis Potassium was replenished, IV hydration was provided, hospitalist was asked to admit  patient for further evaluation and management.  Review of Systems: Constitutional: Negative for chills and fever.  HENT: Negative for ear pain and sore throat.   Eyes: Negative for pain and visual disturbance.  Respiratory: Negative for cough, chest tightness and shortness of breath.   Cardiovascular: Negative for chest pain and palpitations.  Gastrointestinal: Positive for decreased appetite, weight loss and abdominal pain.   Endocrine: Negative for polyphagia and polyuria.  Genitourinary: Negative for decreased urine volume, dysuria, enuresis Musculoskeletal: Positive for gait disturbance and falls.  Negative for arthralgias and back pain.  Skin: Negative for color change and rash.  Allergic/Immunologic: Negative for immunocompromised state.  Neurological: Positive for weakness.  Negative for tremors, syncope, speech difficulty Hematological: Does not bruise/bleed easily.  All other systems reviewed and are negative  Past Medical History:  Diagnosis Date   HTN (hypertension) 10/05/2020   Necrotizing fasciitis [facility])    Past Surgical History:  Procedure Laterality Date   BIOPSY  10/07/2020   Procedure: BIOPSY;  Surgeon: Daneil Dolin, MD;  Location: [facility];  Service: Endoscopy;;   COLONOSCOPY WITH PROPOFOL N/A 10/07/2020   single healing rectal ulcer in distal rectum with surrounding mucosal friability and markedly abnormal proximal colon with ulceration s/p biopsies.   COLONOSCOPY WITH PROPOFOL  02/22/2021   Procedure: COLONOSCOPY WITH PROPOFOL;  Surgeon: [name], DO;  Location: [facility];  Service: Endoscopy;;   ESOPHAGOGASTRODUODENOSCOPY (EGD) WITH PROPOFOL N/A 10/07/2020   non-bleeding gastric ulcer . Pathology with H.pylori negative   ESOPHAGOGASTRODUODENOSCOPY (EGD) WITH PROPOFOL N/A 12/27/2020   Procedure: ESOPHAGOGASTRODUODENOSCOPY (EGD) WITH PROPOFOL;  Surgeon: [name], DO;  Location: [facility];  Service: Endoscopy;  Laterality: N/A;  1:00pm    FLEXIBLE SIGMOIDOSCOPY  N/A 01/11/2020   Procedure: FLEXIBLE SIGMOIDOSCOPY;  Surgeon: Carol Ada, MD;  Location: [facility];  Service: Gastroenterology;  Laterality: N/A;   INCISION AND DRAINAGE PERIRECTAL ABSCESS N/A 12/25/2019   Procedure: IRRIGATION AND DEBRIDEMENT BUTTOCKS, LAP LOOP COLOSTOMY;  Surgeon: Greer Pickerel, MD;  Location: [facility];  Service: General;  Laterality: N/A;   IRRIGATION AND DEBRIDEMENT ABSCESS N/A 12/23/2019   Procedure: EXCISION AND DEBRIDEMENT LEFT BUTTOCK AND PERINEUM;  Surgeon: Clovis Riley, MD;  Location: [facility];  Service: General;  Laterality: N/A;   LAPAROSCOPIC LOOP COLOSTOMY N/A 12/25/2019   Procedure: LAPAROSCOPIC LOOP COLOSTOMY;  Surgeon: Greer Pickerel, MD;  Location: [facility];  Service: General;  Laterality: N/A;    Social History:  reports that she has never smoked. She has never used smokeless tobacco. She reports that she does not currently use alcohol. She reports that she does not currently use drugs.   Allergies  Allergen Reactions   Pantoprazole     Stomach pain   Penicillins Hives    Did it involve swelling of the face/tongue/throat, SOB, or low BP? N Did it involve sudden or severe rash/hives, skin peeling, or any reaction on the inside of your mouth or nose? Y Did you need to seek medical attention at a hospital or doctor's office? N When did it last happen?  Childhood     If all above answers are "NO", may proceed with cephalosporin use.    Oxycodone Hcl Rash    Family History  Problem Relation Age of Onset   Colon polyps Mother        does not believe adenomas   Colon cancer Neg Hx      Prior to Admission medications   Medication Sig Start Date End Date Taking? Authorizing Provider  Nutritional Supplements (BOOST PO) Take 1 Bottle by mouth 4 (four) times daily.    [provider]  ondansetron (ZOFRAN) 4 MG tablet Take 1 tablet (4 mg total) by mouth every 8 (eight) hours as needed for nausea or vomiting. 02/22/21 04/23/21   [name], DO  vitamin C (ASCORBIC ACID) 500 MG tablet Take 500 mg by mouth daily.    [provider]    Physical Exam: BP 97/68   Pulse (!) 110   Temp 98.2 F (36.8 C)   Resp (!) 21   Ht 4' 9"  (1.448 m)   Wt 64.4 kg   SpO2 100%   BMI 30.73 kg/m   General: 50 y.o. year-old female well developed well nourished in no acute distress.  Alert and oriented x3. HEENT: Scleral icterus NCAT, EOMI Neck: Supple, trachea medial Cardiovascular: Regular rate and rhythm with no rubs or gallops.  No thyromegaly or JVD noted.  No lower extremity edema. 2/4 pulses in all 4 extremities. Respiratory: Clear to auscultation with no wheezes or rales. Good inspiratory effort. Abdomen: Soft, tender to palpation without guarding.  Colostomy bag noted in LLQ.   Muskuloskeletal: No cyanosis, clubbing or edema noted bilaterally Neuro: CN II-XII intact, strength 5/5 x 4, sensation, reflexes intact Skin: No ulcerative lesions noted or rashes Psychiatry: Mood is appropriate for condition and setting          Labs on Admission:  Basic Metabolic Panel: Recent Labs  Lab 03/23/21 1750  NA 133*  K 3.1*  CL 97*  CO2 20*  GLUCOSE 66*  BUN <5*  CREATININE <0.30*  CALCIUM 8.6*   Liver Function Tests: Recent Labs  Lab 03/23/21 1750  AST 235*  ALT  63*  ALKPHOS 356*  BILITOT 15.4*  PROT 7.5  ALBUMIN 2.4*   Recent Labs  Lab 03/23/21 1750  LIPASE 32   No results for input(s): AMMONIA in the last 168 hours. CBC: Recent Labs  Lab 03/23/21 1750  WBC 7.5  NEUTROABS 6.0  HGB 9.5*  HCT 27.8*  MCV 97.2  PLT 183   Cardiac Enzymes: No results for input(s): CKTOTAL, CKMB, CKMBINDEX, TROPONINI in the last 168 hours.  BNP (last 3 results) No results for input(s): BNP in the last 8760 hours.  ProBNP (last 3 results) No results for input(s): PROBNP in the last 8760 hours.  CBG: No results for input(s): GLUCAP in the last 168 hours.  Radiological Exams on Admission: CT Abdomen  Pelvis W Contrast  Result Date: 03/23/2021 CLINICAL DATA:  Acute hepatitis. EXAM: CT ABDOMEN AND PELVIS WITH CONTRAST TECHNIQUE: Multidetector CT imaging of the abdomen and pelvis was performed using the standard protocol following bolus administration of intravenous contrast. CONTRAST:  70m OMNIPAQUE IOHEXOL 350 MG/ML SOLN COMPARISON:  CT abdomen and pelvis 01/27/2021. FINDINGS: Lower chest: No acute abnormality. Hepatobiliary: The liver is markedly enlarged, similar to the prior study. There is marked diffuse fatty infiltration of the liver, also unchanged. No focal liver lesions are identified. Gallstones are present. There is no biliary ductal dilatation. Pancreas: Unremarkable. No pancreatic ductal dilatation or surrounding inflammatory changes. Spleen: Normal in size without focal abnormality. Adrenals/Urinary Tract: The kidneys and adrenal glands are within normal limits. The bladder is decompressed. No bladder calculi are seen. Stomach/Bowel: Stomach is within normal limits. Appendix is not seen. No evidence of bowel wall thickening, distention, or inflammatory changes. Left-sided ileostomy is again noted with large peristomal hernia. Vascular/Lymphatic: No significant vascular findings are present. No enlarged abdominal or pelvic lymph nodes. Reproductive: Uterus and bilateral adnexa are unremarkable. Other: Again seen is a large left-sided parastomal hernia containing nondilated colon and small bowel. Wall defect measures 6.0 by 4.2 cm similar to the prior examination. This is similar to prior. There is some mild diffuse mesenteric edema. There is no ascites or free air. There is also small fat containing umbilical hernia. Musculoskeletal: No acute or significant osseous findings. IMPRESSION: 1. Again seen is a left lower quadrant ileostomy with large parastomal hernia containing nondilated bowel loops. There is no evidence for bowel obstruction. There is mild nonspecific mesenteric edema diffusely. 2.  Hepatomegaly and hepatic steatosis changes are similar to the prior study. 3. Cholelithiasis. Electronically Signed   By: ARonney AstersM.D.   On: 03/23/2021 21:51    EKG: I independently viewed the EKG done and my findings are as followed: Probable sinus tachycardia with artifact at rate of around 5 bpm.  QTc 556 ms   Assessment/Plan Present on Admission:  [place]  Elevated LFTs  HTN (hypertension)  Active Problems:   Fall at home, initial encounter   Weakness   Alcoholism ([place]   Elevated LFTs   HTN (hypertension)   Hypokalemia   Hypoalbuminemia due to protein-calorie malnutrition (HCC)   Hypoglycemia   Obesity (BMI 30.0-34.9)   Serum total bilirubin elevated   Normocytic anemia   Prolonged QT interval  Elevated liver enzymes possibly secondary to alcoholic hepatitis AST 2166 ALT 63, ALP 356 CT abdomen pelvis with contrast showed left lower quadrant ileostomy with large parastomal hernia containing nondilated bowel loops. Hepatitis panel was nonreactive Continue IV Compazine as needed for nausea Gastroenterology was consulted and will follow up with patient in the morning per ED physician  Elevated bilirubin in the setting of above Total bilirubin was 15.4, direct bilirubin will be checked Continue management as described above Gastroenterology was already consulted by ED physician  Generalized weakness with recurrent falls This may be due to patient's decreased appetite Continue protein supplement Continue fall precaution and neurochecks Continue PT/OT eval and treat  Prolonged QTc (556 ms) Avoid QT prolonging drugs Magnesium level will be checked  Hypokalemia K+ 3.1 K+ will be replenished Please monitor for AM K+ for further replenishmemnt  Hypoglycemia CBG 66, patient will be provided with orange juice Continue to monitor blood glucose level  Hypoalbuminemia secondary to protein calorie malnutrition Albumin 2 .4, protein supplement will be  provided  Normocytic anemia Stable; hemoglobin is within normal baseline range  Essential hypertension BP meds will be held at this time due to soft BP  Obesity (BMI 30.73) Patient was counseled on diet and lifestyle modification   DVT prophylaxis: Heparin subcu, SCDs  Code Status: Full code  Family Communication: None at bedside  Disposition Plan:  Patient is from:                        home Anticipated DC to:                   SNF or family members home Anticipated DC date:               2-3 days Anticipated DC barriers:          Requires inpatient management due to elevated liver enzymes and elevated bilirubin and pending gastroenterology consult  Consults called: Gastroenterology  Admission status: Inpatient    Bernadette Hoit MD [facility]  03/24/2021, 3:09 AM

## 2021-03-23 NOTE — ED Triage Notes (Signed)
Pt to er, pt states that she is here for general weakness and fatigue, states that she has fallen a few times.  States that she has an ostomy bag and is bleeding around her stoma.

## 2021-03-23 NOTE — ED Provider Notes (Signed)
[facility] EMERGENCY DEPARTMENT Provider Note   CSN: [id number] Arrival date & time: 03/23/21  1614     History Chief Complaint  Patient presents with   Weakness    Alyssa Carey is a 50 y.o. female.  She is here with a complaint of about 1 month of weakness frequent falls and decreased appetite.  She intermittently has some pain around her colostomy and thought she saw some blood in her bag yesterday.  She does admit to alcohol states she drinks maybe twice a week.  Denies any IV drug use.  No history of hepatitis.  Infrequent Tylenol.   The history is provided by the patient.  Weakness Severity:  Severe Onset quality:  Gradual Duration:  4 weeks Timing:  Constant Progression:  Worsening Chronicity:  New Context: alcohol use and stress   Relieved by:  Nothing Worsened by:  Activity Ineffective treatments:  None tried Associated symptoms: abdominal pain, difficulty walking and falls   Associated symptoms: no chest pain, no cough, no diarrhea, no dysuria, no fever, no foul-smelling urine, no frequency, no shortness of breath and no syncope       Past Medical History:  Diagnosis Date   HTN (hypertension) 10/05/2020   Necrotizing fasciitis [facility])     Patient Active Problem List   Diagnosis Date Noted   PUD (peptic ulcer disease) 12/02/2020   Acute blood loss anemia 10/06/2020   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CC)    Elevated alkaline phosphatase level    Abdominal pain    Parastomal hernia without obstruction or gangrene    Acute GI bleeding 10/05/2020   Transaminitis 10/05/2020   HTN (hypertension) 10/05/2020   Mild protein malnutrition ([place]) 10/05/2020   Stercoral ulcer of rectum 01/12/2020   Aphonia 01/12/2020   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   Vocal fold paresis, 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   Hypernatremia 01/12/2020   Anasarca 01/12/2020   Necrotizing fasciitis of pelvic region and thigh ([place]) 12/28/2019   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   Acute respiratory  failure with hypoxemia (HCC)    Elevated LFTs    Pressure injury of skin 12/22/2019   Fall at home, initial encounter 12/21/2019   Weakness 12/21/2019   Alcoholism ([place]) 12/21/2019   Thrombocytopenia ([place]) 12/21/2019   ARF (acute renal failure) ([place]) 12/21/2019   Hepatic encephalopathy ([place]) 05/13/2535   Alcoholic liver disease ([place]) 12/21/2019    Past Surgical History:  Procedure Laterality Date   BIOPSY  10/07/2020   Procedure: BIOPSY;  Surgeon: Daneil Dolin, MD;  Location: [facility];  Service: Endoscopy;;   COLONOSCOPY WITH PROPOFOL N/A 10/07/2020   single healing rectal ulcer in distal rectum with surrounding mucosal friability and markedly abnormal proximal colon with ulceration s/p biopsies.   COLONOSCOPY WITH PROPOFOL  02/22/2021   Procedure: COLONOSCOPY WITH PROPOFOL;  Surgeon: [name], DO;  Location: [facility];  Service: Endoscopy;;   ESOPHAGOGASTRODUODENOSCOPY (EGD) WITH PROPOFOL N/A 10/07/2020   non-bleeding gastric ulcer . Pathology with H.pylori negative   ESOPHAGOGASTRODUODENOSCOPY (EGD) WITH PROPOFOL N/A 12/27/2020   Procedure: ESOPHAGOGASTRODUODENOSCOPY (EGD) WITH PROPOFOL;  Surgeon: [name], DO;  Location: [facility];  Service: Endoscopy;  Laterality: N/A;  1:00pm   FLEXIBLE SIGMOIDOSCOPY N/A 01/11/2020   Procedure: FLEXIBLE SIGMOIDOSCOPY;  Surgeon: Carol Ada, MD;  Location: [facility] END[facility];  Service: Gastroenterology;  Laterality: N/A;   INCISION AND DRAINAGE PERIRECTAL ABSCESS N/A 12/25/2019   Procedure: IRRIGATION AND DEBRIDEMENT BUTTOCKS, LAP LOOP COLOSTOMY;  Surgeon: Greer Pickerel, MD;  Location: [facility] OR[facility];  Service: General;  Laterality: N/A;   IRRIGATION AND DEBRIDEMENT ABSCESS N/A 12/23/2019   Procedure: EXCISION AND DEBRIDEMENT LEFT BUTTOCK AND PERINEUM;  Surgeon: Clovis Riley, MD;  Location: [facility];  Service: General;  Laterality: N/A;   LAPAROSCOPIC LOOP COLOSTOMY N/A 12/25/2019   Procedure: LAPAROSCOPIC LOOP COLOSTOMY;  Surgeon:  Greer Pickerel, MD;  Location: [facility];  Service: General;  Laterality: N/A;     OB History   No obstetric history on file.     Family History  Problem Relation Age of Onset   Colon polyps Mother        does not believe adenomas   Colon cancer Neg Hx     Social History   Tobacco Use   Smoking status: Never   Smokeless tobacco: Never  Vaping Use   Vaping Use: Never used  Substance Use Topics   Alcohol use: Not Currently   Drug use: Not Currently    Home Medications Prior to Admission medications   Medication Sig Start Date End Date Taking? Authorizing Provider  Nutritional Supplements (BOOST PO) Take 1 Bottle by mouth 4 (four) times daily.    [provider]  ondansetron (ZOFRAN) 4 MG tablet Take 1 tablet (4 mg total) by mouth every 8 (eight) hours as needed for nausea or vomiting. 02/22/21 04/23/21  [name], DO  vitamin C (ASCORBIC ACID) 500 MG tablet Take 500 mg by mouth daily.    [provider]    Allergies    Pantoprazole, Penicillins, and Oxycodone hcl  Review of Systems   Review of Systems  Constitutional:  Negative for fever.  HENT:  Negative for sore throat.   Eyes:  Negative for visual disturbance.  Respiratory:  Negative for cough and shortness of breath.   Cardiovascular:  Negative for chest pain and syncope.  Gastrointestinal:  Positive for abdominal pain. Negative for diarrhea.  Genitourinary:  Negative for dysuria and frequency.  Musculoskeletal:  Positive for falls and gait problem.  Skin:  Negative for rash.  Neurological:  Positive for weakness. Negative for speech difficulty.   Physical Exam Updated Vital Signs BP (!) 85/65 (BP Location: Right Arm)   Pulse 96   Temp 98.2 F (36.8 C)   Resp 18   Ht 4' 9"  (1.448 m)   Wt 64.4 kg   SpO2 98%   BMI 30.73 kg/m   Physical Exam Vitals and nursing note reviewed.  Constitutional:      General: She is not in acute distress.    Appearance: Normal appearance. She is  well-developed.  HENT:     Head: Normocephalic and atraumatic.  Eyes:     General: Scleral icterus present.  Cardiovascular:     Rate and Rhythm: Normal rate and regular rhythm.     Heart sounds: No murmur heard. Pulmonary:     Effort: Pulmonary effort is normal. No respiratory distress.     Breath sounds: Normal breath sounds.  Abdominal:     Palpations: Abdomen is soft.     Tenderness: There is abdominal tenderness. There is no guarding or rebound.     Comments: She has some mild tenderness right upper quadrant.  She also has a colostomy in her left lower quadrant.  Musculoskeletal:        General: No deformity or signs of injury. Normal range of motion.     Cervical back: Neck supple.  Skin:    General: Skin is warm and dry.  Neurological:  General: No focal deficit present.     Mental Status: She is alert.     Sensory: No sensory deficit.     Motor: No weakness.    ED Results / Procedures / Treatments   Labs (all labs ordered are listed, but only abnormal results are displayed) Labs Reviewed  COMPREHENSIVE METABOLIC PANEL - Abnormal; Notable for the following components:      Result Value   Sodium 133 (*)    Potassium 3.1 (*)    Chloride 97 (*)    CO2 20 (*)    Glucose, Bld 66 (*)    BUN <5 (*)    Creatinine, Ser <0.30 (*)    Calcium 8.6 (*)    Albumin 2.4 (*)    AST 235 (*)    ALT 63 (*)    Alkaline Phosphatase 356 (*)    Total Bilirubin 15.4 (*)    Anion gap 16 (*)    All other components within normal limits  ETHANOL - Abnormal; Notable for the following components:   Alcohol, Ethyl (B) 205 (*)    All other components within normal limits  CBC WITH DIFFERENTIAL/PLATELET - Abnormal; Notable for the following components:   RBC 2.86 (*)    Hemoglobin 9.5 (*)    HCT 27.8 (*)    RDW 15.6 (*)    All other components within normal limits  PROTIME-INR - Abnormal; Notable for the following components:   Prothrombin Time 15.3 (*)    All other components  within normal limits  ACETAMINOPHEN LEVEL - Abnormal; Notable for the following components:   Acetaminophen (Tylenol), Serum <10 (*)    All other components within normal limits  LIPASE, BLOOD  HEPATITIS PANEL, ACUTE    EKG EKG Interpretation  Date/Time:  Wednesday March 23 2021 22:14:06 EDT Ventricular Rate:  105 PR Interval:    QRS Duration: 131 QT Interval:  424 QTC Calculation: 556 R Axis:   -19 Text Interpretation: probable sinus with artifact Nonspecific intraventricular conduction delay Borderline repolarization abnormality Confirmed by Aletta Edouard [phone]) on 03/23/2021 10:21:35 PM  Radiology CT Abdomen Pelvis W Contrast  Result Date: 03/23/2021 CLINICAL DATA:  Acute hepatitis. EXAM: CT ABDOMEN AND PELVIS WITH CONTRAST TECHNIQUE: Multidetector CT imaging of the abdomen and pelvis was performed using the standard protocol following bolus administration of intravenous contrast. CONTRAST:  46m OMNIPAQUE IOHEXOL 350 MG/ML SOLN COMPARISON:  CT abdomen and pelvis 01/27/2021. FINDINGS: Lower chest: No acute abnormality. Hepatobiliary: The liver is markedly enlarged, similar to the prior study. There is marked diffuse fatty infiltration of the liver, also unchanged. No focal liver lesions are identified. Gallstones are present. There is no biliary ductal dilatation. Pancreas: Unremarkable. No pancreatic ductal dilatation or surrounding inflammatory changes. Spleen: Normal in size without focal abnormality. Adrenals/Urinary Tract: The kidneys and adrenal glands are within normal limits. The bladder is decompressed. No bladder calculi are seen. Stomach/Bowel: Stomach is within normal limits. Appendix is not seen. No evidence of bowel wall thickening, distention, or inflammatory changes. Left-sided ileostomy is again noted with large peristomal hernia. Vascular/Lymphatic: No significant vascular findings are present. No enlarged abdominal or pelvic lymph nodes. Reproductive: Uterus and  bilateral adnexa are unremarkable. Other: Again seen is a large left-sided parastomal hernia containing nondilated colon and small bowel. Wall defect measures 6.0 by 4.2 cm similar to the prior examination. This is similar to prior. There is some mild diffuse mesenteric edema. There is no ascites or free air. There is also small fat containing umbilical hernia.  Musculoskeletal: No acute or significant osseous findings. IMPRESSION: 1. Again seen is a left lower quadrant ileostomy with large parastomal hernia containing nondilated bowel loops. There is no evidence for bowel obstruction. There is mild nonspecific mesenteric edema diffusely. 2. Hepatomegaly and hepatic steatosis changes are similar to the prior study. 3. Cholelithiasis. Electronically Signed   By: Ronney Asters M.D.   On: 03/23/2021 21:51    Procedures .Critical Care Performed by: Hayden Rasmussen, MD Authorized by: Hayden Rasmussen, MD   Critical care provider statement:    Critical care time (minutes):  45   Critical care time was exclusive of:  Separately billable procedures and treating other patients   Critical care was necessary to treat or prevent imminent or life-threatening deterioration of the following conditions:  Metabolic crisis and hepatic failure   Critical care was time spent personally by me on the following activities:  Discussions with consultants, evaluation of patient's response to treatment, examination of patient, ordering and performing treatments and interventions, ordering and review of laboratory studies, ordering and review of radiographic studies, pulse oximetry, re-evaluation of patient's condition, obtaining history from patient or surrogate, review of old charts and development of treatment plan with patient or surrogate   Medications Ordered in ED Medications  prochlorperazine (COMPAZINE) injection 10 mg (has no administration in time range)  hydrocortisone sodium succinate (SOLU-CORTEF) 100 MG  injection 50 mg (50 mg Intravenous Given 03/24/21 0808)  dextrose 5 % and 0.9 % NaCl with KCl 20 mEq/L infusion (has no administration in time range)  thiamine (B-1) injection 100 mg (100 mg Intravenous Given 03/24/21 0810)  LORazepam (ATIVAN) tablet 1-4 mg (1 mg Oral Given 03/24/21 0831)    Or  LORazepam (ATIVAN) injection 1-4 mg ( Intravenous See Alternative 12/17/12 9702)  folic acid (FOLVITE) tablet 1 mg (1 mg Oral Given 03/24/21 0923)  multivitamin with minerals tablet 1 tablet (1 tablet Oral Given 03/24/21 0922)  sodium chloride 0.9 % bolus 1,000 mL (0 mLs Intravenous Stopped 03/23/21 2100)  iohexol (OMNIPAQUE) 350 MG/ML injection 80 mL (80 mLs Intravenous Contrast Given 03/23/21 2116)  potassium chloride SA (KLOR-CON) CR tablet 20 mEq (20 mEq Oral Given 03/23/21 2309)  sodium chloride 0.9 % bolus 1,000 mL (0 mLs Intravenous Stopped 03/24/21 0215)  potassium chloride SA (KLOR-CON) CR tablet 40 mEq (40 mEq Oral Given 03/24/21 0534)  dextrose 50 % solution (50 mLs  Given 03/24/21 0532)  0.9 %  sodium chloride infusion (0 mLs Intravenous Stopped 03/24/21 0813)  diphenhydrAMINE (BENADRYL) injection 12.5 mg (12.5 mg Intravenous Given 03/24/21 0737)  sodium chloride 0.9 % bolus 500 mL (0 mLs Intravenous Stopped 03/24/21 0813)  magnesium sulfate IVPB 4 g 100 mL (4 g Intravenous New Bag/Given 03/24/21 0817)    ED Course  I have reviewed the triage vital signs and the nursing notes.  Pertinent labs & imaging results that were available during my care of the patient were reviewed by me and considered in my medical decision making (see chart for details).  Clinical Course as of 03/24/21 1023  Wed Mar 23, 2021  2011 Discussed with GI Dr. Laural Golden.  He agrees with current management and they will see her tomorrow. [MB]  2227 Discussed with [facility] hospitalist Dr. Josephine Cables who will evaluate the patient for admission. [MB]    Clinical Course User Index [MB] Hayden Rasmussen, MD   MDM Rules/Calculators/A&P  This patient complains of frequent falls generalized weakness; this involves an extensive number of treatment Options and is a complaint that carries with it a high risk of complications and Morbidity. The differential includes dehydration, metabolic derangement, liver failure, intoxication, anemia  I ordered, reviewed and interpreted labs, which included CBC with low potassium low bicarb, LFTs with significant elevations of total bilirubin, similar elevations of liver enzymes, alcohol level elevated, urinalysis without clear signs of infection, talk screen negative, COVID and flu negative I ordered medication IV fluids oral potassium I ordered imaging studies which included CT abdomen pelvis and I independently    visualized and interpreted imaging which showed chronic liver findings  Previous records obtained and reviewed in epic no recent admissions I consulted [facility] hospitalist Dr. Josephine Cables and GI Dr. Laural Golden and discussed lab and imaging findings  Critical Interventions: Work-up and management of patient's metabolic derangement, consultation with GI  After the interventions stated above, I reevaluated the patient and found patient still to be symptomatically weak.  Needs admission for further work-up and treatment.  Patient agreeable to plan.   Final Clinical Impression(s) / ED Diagnoses Final diagnoses:  Acute hepatitis  Alcohol use  Anemia, unspecified type  Generalized weakness  Frequent falls    Rx / DC Orders ED Discharge Orders     None        Hayden Rasmussen, MD 03/24/21 1031

## 2021-03-23 NOTE — ED Notes (Signed)
Patient transported to CT 

## 2021-03-24 DIAGNOSIS — R6510 Systemic inflammatory response syndrome (SIRS) of non-infectious origin without acute organ dysfunction: Secondary | ICD-10-CM | POA: Diagnosis present

## 2021-03-24 DIAGNOSIS — Z7289 Other problems related to lifestyle: Secondary | ICD-10-CM | POA: Insufficient documentation

## 2021-03-24 DIAGNOSIS — R9431 Abnormal electrocardiogram [ECG] [EKG]: Secondary | ICD-10-CM

## 2021-03-24 DIAGNOSIS — E8809 Other disorders of plasma-protein metabolism, not elsewhere classified: Secondary | ICD-10-CM

## 2021-03-24 DIAGNOSIS — D649 Anemia, unspecified: Secondary | ICD-10-CM

## 2021-03-24 DIAGNOSIS — E162 Hypoglycemia, unspecified: Secondary | ICD-10-CM

## 2021-03-24 DIAGNOSIS — E876 Hypokalemia: Secondary | ICD-10-CM

## 2021-03-24 DIAGNOSIS — Z789 Other specified health status: Secondary | ICD-10-CM | POA: Insufficient documentation

## 2021-03-24 DIAGNOSIS — R17 Unspecified jaundice: Secondary | ICD-10-CM

## 2021-03-24 DIAGNOSIS — B179 Acute viral hepatitis, unspecified: Secondary | ICD-10-CM | POA: Insufficient documentation

## 2021-03-24 DIAGNOSIS — E669 Obesity, unspecified: Secondary | ICD-10-CM

## 2021-03-24 LAB — IRON AND TIBC
Iron: 104 ug/dL (ref 28–170)
Saturation Ratios: 90 % — ABNORMAL HIGH (ref 10.4–31.8)
TIBC: 116 ug/dL — ABNORMAL LOW (ref 250–450)
UIBC: 12 ug/dL

## 2021-03-24 LAB — CBC
HCT: 23.1 % — ABNORMAL LOW (ref 36.0–46.0)
HCT: 25.6 % — ABNORMAL LOW (ref 36.0–46.0)
Hemoglobin: 7.7 g/dL — ABNORMAL LOW (ref 12.0–15.0)
Hemoglobin: 8.4 g/dL — ABNORMAL LOW (ref 12.0–15.0)
MCH: 32.6 pg (ref 26.0–34.0)
MCH: 32.7 pg (ref 26.0–34.0)
MCHC: 33.3 g/dL (ref 30.0–36.0)
MCHC: 32.8 g/dL (ref 30.0–36.0)
MCV: 97.9 fL (ref 80.0–100.0)
MCV: 99.6 fL (ref 80.0–100.0)
Platelets: 128 10*3/uL — ABNORMAL LOW (ref 150–400)
Platelets: 132 10*3/uL — ABNORMAL LOW (ref 150–400)
RBC: 2.36 MIL/uL — ABNORMAL LOW (ref 3.87–5.11)
RBC: 2.57 MIL/uL — ABNORMAL LOW (ref 3.87–5.11)
RDW: 15.7 % — ABNORMAL HIGH (ref 11.5–15.5)
RDW: 15.9 % — ABNORMAL HIGH (ref 11.5–15.5)
WBC: 5.3 10*3/uL (ref 4.0–10.5)
WBC: 8.1 10*3/uL (ref 4.0–10.5)
nRBC: 0 % (ref 0.0–0.2)
nRBC: 0 % (ref 0.0–0.2)

## 2021-03-24 LAB — GLUCOSE, CAPILLARY
Glucose-Capillary: 322 mg/dL — ABNORMAL HIGH (ref 70–99)
Glucose-Capillary: 330 mg/dL — ABNORMAL HIGH (ref 70–99)

## 2021-03-24 LAB — CBG MONITORING, ED
Glucose-Capillary: 24 mg/dL — CL (ref 70–99)
Glucose-Capillary: 143 mg/dL — ABNORMAL HIGH (ref 70–99)
Glucose-Capillary: 168 mg/dL — ABNORMAL HIGH (ref 70–99)
Glucose-Capillary: 240 mg/dL — ABNORMAL HIGH (ref 70–99)

## 2021-03-24 LAB — MRSA NEXT GEN BY PCR, NASAL: MRSA by PCR Next Gen: NOT DETECTED

## 2021-03-24 LAB — COMPREHENSIVE METABOLIC PANEL
ALT: 47 U/L — ABNORMAL HIGH (ref 0–44)
AST: 187 U/L — ABNORMAL HIGH (ref 15–41)
Albumin: 1.8 g/dL — ABNORMAL LOW (ref 3.5–5.0)
Alkaline Phosphatase: 277 U/L — ABNORMAL HIGH (ref 38–126)
Anion gap: 13 (ref 5–15)
BUN: <5 mg/dL — ABNORMAL LOW (ref 6–20)
CO2: 19 mmol/L — ABNORMAL LOW (ref 22–32)
Calcium: 8.1 mg/dL — ABNORMAL LOW (ref 8.9–10.3)
Chloride: 106 mmol/L (ref 98–111)
Creatinine, Ser: <0.3 mg/dL — ABNORMAL LOW (ref 0.44–1.00)
Glucose, Bld: 35 mg/dL — CL (ref 70–99)
Potassium: 3.4 mmol/L — ABNORMAL LOW (ref 3.5–5.1)
Sodium: 138 mmol/L (ref 135–145)
Total Bilirubin: 12.4 mg/dL — ABNORMAL HIGH (ref 0.3–1.2)
Total Protein: 5.9 g/dL — ABNORMAL LOW (ref 6.5–8.1)

## 2021-03-24 LAB — FOLATE: Folate: 9.7 ng/mL (ref >5.9)

## 2021-03-24 LAB — PROTIME-INR
INR: 1.3 — ABNORMAL HIGH (ref 0.8–1.2)
Prothrombin Time: 15.8 seconds — ABNORMAL HIGH (ref 11.4–15.2)

## 2021-03-24 LAB — HIV ANTIBODY (ROUTINE TESTING W REFLEX): HIV Screen 4th Generation wRfx: NONREACTIVE

## 2021-03-24 LAB — BILIRUBIN, DIRECT: Bilirubin, Direct: 8.8 mg/dL — ABNORMAL HIGH (ref 0.0–0.2)

## 2021-03-24 LAB — PHOSPHORUS: Phosphorus: 2.8 mg/dL (ref 2.5–4.6)

## 2021-03-24 LAB — APTT: aPTT: 43 seconds — ABNORMAL HIGH (ref 24–36)

## 2021-03-24 LAB — VITAMIN B12: Vitamin B-12: 1445 pg/mL — ABNORMAL HIGH (ref 180–914)

## 2021-03-24 LAB — MAGNESIUM: Magnesium: 1.4 mg/dL — ABNORMAL LOW (ref 1.7–2.4)

## 2021-03-24 LAB — FERRITIN: Ferritin: 1343 ng/mL — ABNORMAL HIGH (ref 11–307)

## 2021-03-24 MED ORDER — KCL IN DEXTROSE-NACL 20-5-0.9 MEQ/L-%-% IV SOLN
INTRAVENOUS | Status: DC
  Filled 2021-03-24 (×4): qty 1000

## 2021-03-24 MED ORDER — ADULT MULTIVITAMIN W/MINERALS CH
1.0000 | ORAL_TABLET | Freq: Every day | ORAL | Status: DC
  Administered 2021-03-24 – 2021-03-29 (×6): 1 via ORAL
  Filled 2021-03-24 (×6): qty 1

## 2021-03-24 MED ORDER — LORAZEPAM 2 MG/ML IJ SOLN: 1.0000 mg | INTRAMUSCULAR | Status: DC | PRN

## 2021-03-24 MED ORDER — HYDROXYZINE HCL 25 MG PO TABS
25.0000 mg | ORAL_TABLET | ORAL | Status: DC | PRN
  Administered 2021-03-24 – 2021-03-25 (×3): 25 mg via ORAL
  Filled 2021-03-24 (×3): qty 1

## 2021-03-24 MED ORDER — PROCHLORPERAZINE EDISYLATE 10 MG/2ML IJ SOLN
10.0000 mg | Freq: Four times a day (QID) | INTRAMUSCULAR | Status: DC | PRN
  Administered 2021-03-27: 10 mg via INTRAVENOUS
  Filled 2021-03-24: qty 2

## 2021-03-24 MED ORDER — DIPHENHYDRAMINE HCL 50 MG/ML IJ SOLN
INTRAMUSCULAR | Status: AC
  Filled 2021-03-24: qty 1

## 2021-03-24 MED ORDER — SODIUM CHLORIDE 0.9 % IV BOLUS
500.0000 mL | Freq: Once | INTRAVENOUS | Status: AC
  Administered 2021-03-24: 500 mL via INTRAVENOUS

## 2021-03-24 MED ORDER — FOLIC ACID 1 MG PO TABS
1.0000 mg | ORAL_TABLET | Freq: Every day | ORAL | Status: DC
  Administered 2021-03-24 – 2021-03-29 (×6): 1 mg via ORAL
  Filled 2021-03-24 (×6): qty 1

## 2021-03-24 MED ORDER — THIAMINE HCL 100 MG/ML IJ SOLN
100.0000 mg | Freq: Every day | INTRAMUSCULAR | Status: DC
  Administered 2021-03-24 – 2021-03-29 (×6): 100 mg via INTRAVENOUS
  Filled 2021-03-24 (×6): qty 2

## 2021-03-24 MED ORDER — POTASSIUM CHLORIDE CRYS ER 20 MEQ PO TBCR
40.0000 meq | EXTENDED_RELEASE_TABLET | Freq: Once | ORAL | Status: AC
  Administered 2021-03-24: 40 meq via ORAL
  Filled 2021-03-24: qty 2

## 2021-03-24 MED ORDER — HYDROCORTISONE NA SUCCINATE PF 100 MG IJ SOLR
50.0000 mg | Freq: Three times a day (TID) | INTRAMUSCULAR | Status: AC
  Administered 2021-03-24 – 2021-03-25 (×3): 50 mg via INTRAVENOUS
  Filled 2021-03-24 (×3): qty 2

## 2021-03-24 MED ORDER — CHLORHEXIDINE GLUCONATE CLOTH 2 % EX PADS
6.0000 | MEDICATED_PAD | Freq: Every day | CUTANEOUS | Status: DC
  Administered 2021-03-24 – 2021-03-29 (×4): 6 via TOPICAL

## 2021-03-24 MED ORDER — FAMOTIDINE 20 MG PO TABS
20.0000 mg | ORAL_TABLET | Freq: Two times a day (BID) | ORAL | Status: DC
  Administered 2021-03-24 – 2021-03-29 (×11): 20 mg via ORAL
  Filled 2021-03-24 (×11): qty 1

## 2021-03-24 MED ORDER — LORAZEPAM 1 MG PO TABS
1.0000 mg | ORAL_TABLET | ORAL | Status: DC | PRN
  Administered 2021-03-24: 1 mg via ORAL
  Filled 2021-03-24: qty 1

## 2021-03-24 MED ORDER — MAGNESIUM SULFATE 4 GM/100ML IV SOLN
4.0000 g | Freq: Once | INTRAVENOUS | Status: AC
  Administered 2021-03-24: 4 g via INTRAVENOUS
  Filled 2021-03-24: qty 100

## 2021-03-24 MED ORDER — DEXTROSE 50 % IV SOLN
INTRAVENOUS | Status: AC
  Administered 2021-03-24: 50 mL
  Filled 2021-03-24: qty 50

## 2021-03-24 MED ORDER — POTASSIUM CHLORIDE IN NACL 20-0.9 MEQ/L-% IV SOLN: INTRAVENOUS | Status: AC

## 2021-03-24 MED ORDER — DIPHENHYDRAMINE HCL 50 MG/ML IJ SOLN
12.5000 mg | Freq: Once | INTRAMUSCULAR | Status: AC
  Administered 2021-03-24: 12.5 mg via INTRAVENOUS

## 2021-03-24 MED ORDER — ENSURE ENLIVE PO LIQD
237.0000 mL | Freq: Two times a day (BID) | ORAL | Status: DC
  Filled 2021-03-24 (×2): qty 237

## 2021-03-24 MED ORDER — SODIUM CHLORIDE 0.9 % IV SOLN: Freq: Once | INTRAVENOUS | Status: AC

## 2021-03-24 NOTE — Consult Note (Signed)
Referring Provider: Cherene Altes, MD Primary Care Physician:  [facility] Primary Gastroenterologist:  Elon Alas. Abbey Chatters, DO  Reason for Consultation: Elevated LFTs  HPI: Alyssa Carey is a 50 y.o. female with prior history of necrotizing fasciitis status post diverting colostomy in June 2021, history of alcohol abuse, history of GI bleed in March 2022, hypertension, presenting to the emergency department yesterday for generalized weakness, diminished oral intake with greater than 20 pound weight loss, abdominal pain, blood in her ostomy bag, nausea.     On presentation the ED triage provider noted that the patient appeared jaundiced.  Patient reported not being able to eat for 3 weeks, multiple falls due to weakness.  Complained of nausea and poor appetite. Only nibbles and drinks fluid/ensure. Reported 20 pound weight loss in 6 weeks. Recently saw Dr. Marcello Moores to discuss colostomy reversal but due to poor nutrition surgery has been put on hold. She has had some blood around her stoma. Says she gets sharp pains at site of her parastomal hernia at times. Denies significant blood in her ostomy bag. Minimal rectal discharge at times, nonbloody mucous. No heartburn. She has had some vomiting. No melena. No dysphagia. She reports no ASA, NSAIDS. Occasional tylenol but none lately. She initially told me she has had no etoh in 3-4 months. I mentioned elevated alcohol level this admission and then she reports drinking 2-3 beers and few wine coolers Labor Day. No illicit drug use.   In the ED: Sodium 133, potassium 3.1, BUN less than 5, creatinine less than 0.30, albumin 2.4, AST 235, ALT 63, alkaline phosphatase 356, total bilirubin 15.4.  In May 2022, her AST was 422, ALT 104 but total bilirubin was normal at that time.  Alcohol level in the ED was 205.  White blood cell count 7500, hemoglobin 9.5, hematocrit 27.8, MCV 97.2, platelets 183,000.  INR 1.2.  Acetaminophen level less than 10.   Lipase 32.  Hepatitis B surface antigen nonreactive, hepatitis C antibody nonreactive, hepatitis A IgM nonreactive, hepatitis B core IgM nonreactive.  Urine drug screen negative.  SARS coronavirus 2 negative.  CT abdomen pelvis with contrast: Liver markedly enlarged, similar to July 2022 study, Marked diffuse fatty infiltration of the liver also unchanged.  Gallstones present.  No biliary ductal dilation.  Pancreas and spleen unremarkable.  Left-sided ileostomy with large parastomal hernia noted containing nondilated colon and small bowel with wall defect measuring 6 x 4.2 cm similar to prior study.  There is some mild diffuse mesenteric edema.  A small fat-containing umbilical hernia.  Today: Potassium 3.4, sodium 138, glucose 35, BUN less than 5, creatinine less than 0.3, albumin 1.8, total bilirubin 12.4, alkaline phosphatase 277, AST 187, ALT 47, hemoglobin 7.7, platelets 128,000, INR 1.3, magnesium 1.4.  Prior work up:  Colonoscopy August 2022: - Preparation of the colon was fair. - The rectum appeared normal. Previously noted rectal ulcer has healed. Mucosa was quite friable with evidence of minor barotrauma with evaluation of the rectum itself. All air was suctioned out as scope was withdrawn through the remaining colon. No active colitis. No polyps or evidence of cancer identified.  EGD June 2022: - Gastritis. - Normal duodenal bulb, first portion of the duodenum and second portion of the duodenum. - No specimens collected.  Colonoscopy March 2022: -Single rectal ulcer with surrounding inflammation as described; no evidence of fistula;otherwise descending loop of colon appeared normal. -Markedly abnormal proximal colon as described. Status post biopsy. -Active nonspecific colitis normal-appearing terminal ileum.  -  Query recent ischemic event versus NSAID insult versus peristomal hernia playing a role here.   - GI pathogen panel resulted enterotoxigenic E. coli  EGD March 2022: -  normal esophagus - small hiatal hernia -Nonbleeding gastric ulcer.  Pathology negative for H. pylori     Prior to Admission medications   Medication Sig Start Date End Date Taking? Authorizing Provider  Nutritional Supplements (BOOST PO) Take 1 Bottle by mouth 4 (four) times daily.    [provider]  ondansetron (ZOFRAN) 4 MG tablet Take 1 tablet (4 mg total) by mouth every 8 (eight) hours as needed for nausea or vomiting. 02/22/21 04/23/21  [name], DO  vitamin C (ASCORBIC ACID) 500 MG tablet Take 500 mg by mouth daily.    [provider]    Current Facility-Administered Medications  Medication Dose Route Frequency Provider Last Rate Last Admin   dextrose 5 % and 0.9 % NaCl with KCl 20 mEq/L infusion   Intravenous Continuous Cherene Altes, MD       hydrocortisone sodium succinate (SOLU-CORTEF) 100 MG injection 50 mg  50 mg Intravenous Q8H Cherene Altes, MD       magnesium sulfate IVPB 4 g 100 mL  4 g Intravenous Once Cherene Altes, MD       prochlorperazine (COMPAZINE) injection 10 mg  10 mg Intravenous Q6H PRN Adefeso, Oladapo, DO       thiamine (B-1) injection 100 mg  100 mg Intravenous Daily Cherene Altes, MD       Current Outpatient Medications  Medication Sig Dispense Refill   Nutritional Supplements (BOOST PO) Take 1 Bottle by mouth 4 (four) times daily.     ondansetron (ZOFRAN) 4 MG tablet Take 1 tablet (4 mg total) by mouth every 8 (eight) hours as needed for nausea or vomiting. 30 tablet 1   vitamin C (ASCORBIC ACID) 500 MG tablet Take 500 mg by mouth daily.      Allergies as of 03/23/2021 - Review Complete 03/23/2021  Allergen Reaction Noted   Pantoprazole  02/17/2021   Penicillins Hives 12/21/2019   Oxycodone hcl Rash 10/06/2020    Past Medical History:  Diagnosis Date   HTN (hypertension) 10/05/2020   Necrotizing fasciitis [facility])     Past Surgical History:  Procedure Laterality Date   BIOPSY  10/07/2020   Procedure:  BIOPSY;  Surgeon: Daneil Dolin, MD;  [facility];  Service: Endoscopy;;   COLONOSCOPY WITH PROPOFOL N/A 10/07/2020   single healing rectal ulcer in distal rectum with surrounding mucosal friability and markedly abnormal proximal colon with ulceration s/p biopsies.   COLONOSCOPY WITH PROPOFOL  02/22/2021   Procedure: COLONOSCOPY WITH PROPOFOL;  Surgeon: [name], DO;  [facility];  Service: Endoscopy;;   ESOPHAGOGASTRODUODENOSCOPY (EGD) WITH PROPOFOL N/A 10/07/2020   non-bleeding gastric ulcer . Pathology with H.pylori negative   ESOPHAGOGASTRODUODENOSCOPY (EGD) WITH PROPOFOL N/A 12/27/2020   Procedure: ESOPHAGOGASTRODUODENOSCOPY (EGD) WITH PROPOFOL;  Surgeon: [name], DO;  Location: [facility];  Service: Endoscopy;  Laterality: N/A;  1:00pm   FLEXIBLE SIGMOIDOSCOPY N/A 01/11/2020   Procedure: FLEXIBLE SIGMOIDOSCOPY;  Surgeon: Carol Ada, MD;  Location: [facility];  Service: Gastroenterology;  Laterality: N/A;   INCISION AND DRAINAGE PERIRECTAL ABSCESS N/A 12/25/2019   Procedure: IRRIGATION AND DEBRIDEMENT BUTTOCKS, LAP LOOP COLOSTOMY;  Surgeon: Greer Pickerel, MD;  Location: [facility];  Service: General;  Laterality: N/A;   IRRIGATION AND DEBRIDEMENT ABSCESS N/A 12/23/2019   Procedure: EXCISION  AND DEBRIDEMENT LEFT BUTTOCK AND PERINEUM;  Surgeon: Clovis Riley, MD;  Location: [facility];  Service: General;  Laterality: N/A;   LAPAROSCOPIC LOOP COLOSTOMY N/A 12/25/2019   Procedure: LAPAROSCOPIC LOOP COLOSTOMY;  Surgeon: Greer Pickerel, MD;  Location: [facility];  Service: General;  Laterality: N/A;    Family History  Problem Relation Age of Onset   Colon polyps Mother        does not believe adenomas   Colon cancer Neg Hx     Social History   Socioeconomic History   Marital status: Single    Spouse name: Not on file   Number of children: 1   Years of education: Not on file   Highest education level: Not on file  Occupational History   Occupation:  unknown  Tobacco Use   Smoking status: Never   Smokeless tobacco: Never  Vaping Use   Vaping Use: Never used  Substance and Sexual Activity   Alcohol use: Not Currently   Drug use: Not Currently   Sexual activity: Not Currently  Other Topics Concern   Not on file  Social History Narrative   Patient found in hotel room; unsure of living situation prior.   Social Determinants of Health   Financial Resource Strain: Not on file  Food Insecurity: Not on file  Transportation Needs: Not on file  Physical Activity: Not on file  Stress: Not on file  Social Connections: Not on file  Intimate Partner Violence: Not on file     ROS:  General: Negative for fever, chills. See hpi Eyes: Negative for vision changes.  ENT: Negative for hoarseness, difficulty swallowing , nasal congestion. CV: Negative for chest pain, angina, palpitations, dyspnea on exertion, peripheral edema.  Respiratory: Negative for dyspnea at rest, dyspnea on exertion, cough, sputum, wheezing.  GI: See history of present illness. GU:  Negative for dysuria, hematuria, urinary incontinence, urinary frequency, nocturnal urination.  MS: Negative for joint pain, low back pain.  Derm: Negative for rash or itching.  Neuro: Negative for weakness, abnormal sensation, seizure, frequent headaches, memory loss, confusion.  Psych: Negative for anxiety, depression, suicidal ideation, hallucinations.  Endo: see hpi  Heme: Negative for bruising or bleeding. Allergy: Negative for rash or hives.       Physical Examination: Vital signs in last 24 hours: Temp:  [98.2 F (36.8 C)] 98.2 F (36.8 C) (09/07 1639) Pulse Rate:  [96-138] 127 (09/08 0700) Resp:  [18-33] 33 (09/08 0700) BP: (82-116)/(65-73) 84/70 (09/08 0700) SpO2:  [96 %-100 %] 99 % (09/08 0700) Weight:  [64.4 kg] 64.4 kg (09/07 1638)    General: Well-nourished, well-developed in no acute distress.  Head: Normocephalic, atraumatic.   Eyes: Conjunctiva pink, +  icterus. Mouth: Oropharyngeal mucosa moist and pink , no lesions erythema or exudate. Neck: Supple without thyromegaly, masses, or lymphadenopathy.  Lungs: Clear to auscultation bilaterally.  Heart: Regular rate and rhythm, no murmurs rubs or gallops.  Abdomen: Bowel sounds are normal, nontender, nondistended, no hepatosplenomegaly or masses, no abdominal bruits, no rebound or guarding.  Large parastomal hernia. Stoma beefy red. No blood noted. Stool dark brown.  Rectal: not performed.  Extremities: trace bilateral lower extremity edema. No clubbing, deformity.  Neuro: Alert and oriented x 4 , grossly normal neurologically.  Skin: Warm and dry, no rash or jaundice.   Psych: Alert and cooperative, normal mood and affect.        Intake/Output from previous day: 09/07 0701 - 09/08 0700 In: 1000 [IV Piggyback:1000] Out: -  Intake/Output this shift: No intake/output data recorded.  Lab Results: CBC Recent Labs    03/23/21 1750 03/24/21 0430  WBC 7.5 5.3  HGB 9.5* 7.7*  HCT 27.8* 23.1*  MCV 97.2 97.9  PLT 183 128*   BMET Recent Labs    03/23/21 1750 03/24/21 0430  NA 133* 138  K 3.1* 3.4*  CL 97* 106  CO2 20* 19*  GLUCOSE 66* 35*  BUN <5* <5*  CREATININE <0.30* <0.30*  CALCIUM 8.6* 8.1*   LFT Recent Labs    03/23/21 1750 03/24/21 0430  BILITOT 15.4* 12.4*  ALKPHOS 356* 277*  AST 235* 187*  ALT 63* 47*  PROT 7.5 5.9*  ALBUMIN 2.4* 1.8*    Lipase Recent Labs    03/23/21 1750  LIPASE 32    PT/INR Recent Labs    03/23/21 1750 03/24/21 0430  LABPROT 15.3* 15.8*  INR 1.2 1.3*      Imaging Studies: CT Abdomen Pelvis W Contrast  Result Date: 03/23/2021 CLINICAL DATA:  Acute hepatitis. EXAM: CT ABDOMEN AND PELVIS WITH CONTRAST TECHNIQUE: Multidetector CT imaging of the abdomen and pelvis was performed using the standard protocol following bolus administration of intravenous contrast. CONTRAST:  72m OMNIPAQUE IOHEXOL 350 MG/ML SOLN COMPARISON:  CT abdomen  and pelvis 01/27/2021. FINDINGS: Lower chest: No acute abnormality. Hepatobiliary: The liver is markedly enlarged, similar to the prior study. There is marked diffuse fatty infiltration of the liver, also unchanged. No focal liver lesions are identified. Gallstones are present. There is no biliary ductal dilatation. Pancreas: Unremarkable. No pancreatic ductal dilatation or surrounding inflammatory changes. Spleen: Normal in size without focal abnormality. Adrenals/Urinary Tract: The kidneys and adrenal glands are within normal limits. The bladder is decompressed. No bladder calculi are seen. Stomach/Bowel: Stomach is within normal limits. Appendix is not seen. No evidence of bowel wall thickening, distention, or inflammatory changes. Left-sided ileostomy is again noted with large peristomal hernia. Vascular/Lymphatic: No significant vascular findings are present. No enlarged abdominal or pelvic lymph nodes. Reproductive: Uterus and bilateral adnexa are unremarkable. Other: Again seen is a large left-sided parastomal hernia containing nondilated colon and small bowel. Wall defect measures 6.0 by 4.2 cm similar to the prior examination. This is similar to prior. There is some mild diffuse mesenteric edema. There is no ascites or free air. There is also small fat containing umbilical hernia. Musculoskeletal: No acute or significant osseous findings. IMPRESSION: 1. Again seen is a left lower quadrant ileostomy with large parastomal hernia containing nondilated bowel loops. There is no evidence for bowel obstruction. There is mild nonspecific mesenteric edema diffusely. 2. Hepatomegaly and hepatic steatosis changes are similar to the prior study. 3. Cholelithiasis. Electronically Signed   By: ARonney AstersM.D.   On: 03/23/2021 21:51  [4 week]      Impression: Pleasant 50year old female with history of alcohol abuse, necrotizing fasciitis status post diverting colostomy in June 2021, history of GI bleed in March  2020 continue with outlined above, presenting to the ED with generalized weakness, diminished oral intake with 20 pound weight loss, small amount of blood in her ostomy bag, abdominal pain.  GI consulted for abnormal LFTs.  Abnormal LFTs: History of elevated AST/ALT/alk phos dating back to March.  Bilirubin has been normal.  Presented this admission with total bili of 15.4, alk phos 356, AST 235, ALT 63.  Today her total bilirubin is down to 12.4, alk phos 277, AST 187, ALT 47.  Total bilirubin has not been differentiated.  Numbers likely elevated  due to alcoholic hepatitis.  Patient does not readily admit to ongoing alcohol abuse but does mention alcohol use Monday.  Chronically with poor appetite, nausea with 20 pound weight loss.  Recent updated colonoscopy and EGD with resolution of rectal ulcer and gastric ulcer.  No evidence of persistent colitis.  Poor appetite may be driven by ongoing alcohol abuse and alcoholic hepatitis. Hepatic DF using control PT of 12 seconds along with today's total bilirubin was 29.9. currently does not quality for prednisolone. Currently on iv solumedrol ordered by ED provider for unclear reasons.  Abdominal pain: She has intermittent sharp pains at site of parastomal hernia.  Current imaging containing nondilated bowel loops and mild nonspecific mesenteric edema diffusely.  No evidence of bowel obstruction.  Colostomy reversal has been postponed due to poor nutrition.  Normocytic anemia: Hemoglobin has been in the 9-10 range back in March.  Dec 02, 2020 hemoglobin 12.1.  On admission hemoglobin 9.5 down to 7.7 overnight likely due to hemodilution normal affect.  Suspect anemia of chronic disease.  Patient reports low-volume blood around her stoma.  Denies melena or significant blood in her ostomy bag.  EGD and colonoscopy up-to-date.  Prolonged QTC (556 ms)  Plan: Check anemia panel. Complete previously planned liver serologies while inpatient.  Monitor LFTs, INR.   Avoid etoh.  Pepcid given pantoprazole listed as causing abd pain. Direct bilirubin.  We would like to thank you for the opportunity to participate in the care of Alyssa Carey.  Alyssa Carey. Bernarda Caffey [facility] [phone] 9/8/202211:41 AM    LOS: 1 day

## 2021-03-24 NOTE — ED Notes (Signed)
PA-C Provider at bedside.

## 2021-03-24 NOTE — Evaluation (Signed)
Physical Therapy Evaluation Patient Details Name: Alyssa Carey MRN: [id number] DOB: 1971/05/23 Today's Date: 03/24/2021   History of Present Illness  Alyssa Carey is a 50 y.o. female with medical history significant for hypertension, necrotizing fasciitis requiring colostomy, alcohol dependence who presents to the emergency department due to 1 month history of weakness and frequent falls, she complained of decreased appetite with a loss of more than 20 pounds within the last 6 weeks.  She complained of intermittent pain around the colostomy and endorsed seeing some blood in colostomy bag yesterday.  Patient states that she drinks wine occasionally and that last alcohol intake was about a month ago.  She complained of nausea but no vomiting.  She denies fever, chills, chest pain, shortness of breath or IV drug use.   Clinical Impression  Patient functioning near baseline for functional mobility and gait other than requiring Min guard assist for sitting up at bedside and slightly labored cadence during ambulation without use of an AD and no loss of balance.  Patient encouraged to stay out of bed and ambulate with nursing staff when needing to go to bathroom.  Patient tolerated sitting up in chair after therapy.  Patient will benefit from continued physical therapy in hospital and recommended venue below to increase strength, balance, endurance for safe ADLs and gait.       Follow Up Recommendations No PT follow up;Supervision - Intermittent    Equipment Recommendations  None recommended by PT    Recommendations for Other Services       Precautions / Restrictions Precautions Precautions: None Restrictions Weight Bearing Restrictions: No      Mobility  Bed Mobility Overal bed mobility: Needs Assistance Bed Mobility: Supine to Sit     Supine to sit: Min guard     General bed mobility comments: increased time, labored movement    Transfers Overall transfer level: Needs  assistance Equipment used: None Transfers: Sit to/from Stand;Stand Pivot Transfers Sit to Stand: Supervision Stand pivot transfers: Supervision       General transfer comment: slightly labored movement  Ambulation/Gait Ambulation/Gait assistance: Supervision;Modified independent (Device/Increase time) Gait Distance (Feet): 100 Feet Assistive device: None;IV Pole Gait Pattern/deviations: WFL(Within Functional Limits) Gait velocity: decreased   General Gait Details: grossly WFL with slightly labored cadence without loss of balance  Stairs            Wheelchair Mobility    Modified Rankin (Stroke Patients Only)       Balance Overall balance assessment: No apparent balance deficits (not formally assessed)                                           Pertinent Vitals/Pain Pain Assessment: No/denies pain    Home Living Family/patient expects to be discharged to:: Private residence Living Arrangements: Other relatives Available Help at Discharge: Family;Available 24 hours/day Type of Home: House Home Access: Stairs to enter Entrance Stairs-Rails: None Entrance Stairs-Number of Steps: 2 Home Layout: One level        Prior Function Level of Independence: Independent         Comments: Hydrographic surveyor, drives     Journalist, newspaper        Extremity/Trunk Assessment   Upper Extremity Assessment Upper Extremity Assessment: Overall WFL for tasks assessed    Lower Extremity Assessment Lower Extremity Assessment: Generalized weakness    Cervical / Trunk Assessment  Cervical / Trunk Assessment: Normal  Communication   Communication: No difficulties  Cognition Arousal/Alertness: Awake/alert Behavior During Therapy: WFL for tasks assessed/performed Overall Cognitive Status: Within Functional Limits for tasks assessed                                        General Comments      Exercises     Assessment/Plan    PT  Assessment Patient needs continued PT services  PT Problem List Decreased strength;Decreased activity tolerance;Decreased balance;Decreased mobility       PT Treatment Interventions Gait training;Stair training;Functional mobility training;DME instruction;Therapeutic activities;Therapeutic exercise;Balance training;Patient/family education    PT Goals (Current goals can be found in the Care Plan section)  Acute Rehab PT Goals Patient Stated Goal: return home with family to assist PT Goal Formulation: With patient Time For Goal Achievement: 03/26/21 Potential to Achieve Goals: Good    Frequency Min 2X/week   Barriers to discharge        Co-evaluation               AM-PAC PT "6 Clicks" Mobility  Outcome Measure Help needed turning from your back to your side while in a flat bed without using bedrails?: None Help needed moving from lying on your back to sitting on the side of a flat bed without using bedrails?: A Little Help needed moving to and from a bed to a chair (including a wheelchair)?: None Help needed standing up from a chair using your arms (e.g., wheelchair or bedside chair)?: None Help needed to walk in hospital room?: A Little Help needed climbing 3-5 steps with a railing? : A Little 6 Click Score: 21    End of Session   Activity Tolerance: Patient tolerated treatment well;Patient limited by fatigue Patient left: in chair;with call bell/phone within reach Nurse Communication: Mobility status PT Visit Diagnosis: Unsteadiness on feet (R26.81);Other abnormalities of gait and mobility (R26.89);Muscle weakness (generalized) (M62.81)    Time: 5248-1859 PT Time Calculation (min) (ACUTE ONLY): 20 min   Charges:   PT Evaluation $PT Eval Moderate Complexity: 1 Mod PT Treatments $Therapeutic Activity: 23-37 mins        10:19 AM, 03/24/21 Lonell Grandchild, MPT Physical Therapist with [phone] office [phone] mobile phone

## 2021-03-24 NOTE — ED Notes (Signed)
Phlebotomy @ bedside

## 2021-03-24 NOTE — TOC Initial Note (Signed)
Transition of Care [facility]) - Initial/Assessment Note    Patient Details  Name: Alyssa Carey MRN: [id number] Date of Birth: 16-Sep-1970  Transition of Care [facility]) CM/SW Contact:    Iona Beard, [place] Phone Number: 03/24/2021, 12:52 PM  Clinical Narrative:                 CSW spoke with pt to complete assessment as pt is high risk for readmission. Pt lives with her brother and her son. Pt is able to complete ADLs independently. Pt is able to provide her own transportation. Pt Has not had [place] services and does not feel that she would need them. Pt has her mothers old walker to use if needed. TOC consulted for substance use resources. CSW inquired about substance use, pt states she does not use any substances and that she is not interested in any resources. TOC to follow for possible d/c needs.   Expected Discharge Plan: Home/Self Care Barriers to Discharge: Continued Medical Work up   Patient Goals and CMS Choice Patient states their goals for this hospitalization and ongoing recovery are:: Return home CMS Medicare.gov Compare Post Acute Care list provided to:: Patient Choice offered to / list presented to : Patient  Expected Discharge Plan and Services Expected Discharge Plan: Home/Self Care In-house Referral: Clinical Social Work Discharge Planning Services: CM Consult   Living arrangements for the past 2 months: Single Family Home                                      Prior Living Arrangements/Services Living arrangements for the past 2 months: Single Family Home Lives with:: Adult Children, Siblings Patient language and need for interpreter reviewed:: Yes Do you feel safe going back to the place where you live?: Yes      Need for Family Participation in Patient Care: No (Comment) Care giver support system in place?: Yes (comment) Current home services: DME Criminal Activity/Legal Involvement Pertinent to Current Situation/Hospitalization: No - Comment as  needed  Activities of Daily Living      Permission Sought/Granted                  Emotional Assessment Appearance:: Appears stated age Attitude/Demeanor/Rapport: Engaged Affect (typically observed): Accepting Orientation: : Oriented to Self, Oriented to Place, Oriented to Situation, Oriented to  Time Alcohol / Substance Use: Not Applicable Psych Involvement: No (comment)  Admission diagnosis:  Abnormal liver enzymes [R74.8] SIRS (systemic inflammatory response syndrome) (HCC) [R65.10] Patient Active Problem List   Diagnosis Date Noted   Hypokalemia 03/24/2021   Hypoalbuminemia due to protein-calorie malnutrition ([place]) 03/24/2021   Hypoglycemia 03/24/2021   Obesity (BMI 30.0-34.9) 03/24/2021   Serum total bilirubin elevated 03/24/2021   Normocytic anemia 03/24/2021   Prolonged QT interval 03/24/2021   SIRS (systemic inflammatory response syndrome) ([place]) 03/24/2021   Abnormal liver enzymes 03/23/2021   PUD (peptic ulcer disease) 12/02/2020   Acute blood loss anemia 10/06/2020   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CC)    Elevated alkaline phosphatase level    Abdominal pain    Parastomal hernia without obstruction or gangrene    Acute GI bleeding 10/05/2020   Transaminitis 10/05/2020   HTN (hypertension) 10/05/2020   Mild protein malnutrition ([place]) 10/05/2020   Stercoral ulcer of rectum 01/12/2020   Aphonia 01/12/2020   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   Vocal fold paresis, 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   Hypernatremia 01/12/2020   Anasarca  01/12/2020   Necrotizing fasciitis of pelvic region and thigh ([place]) 12/28/2019   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   Acute respiratory failure with hypoxemia (HCC)    Elevated LFTs    Pressure injury of skin 12/22/2019   Fall at home, initial encounter 12/21/2019   Weakness 12/21/2019   Alcoholism ([place]) 12/21/2019   Thrombocytopenia ([place]) 12/21/2019   ARF (acute renal failure) ([place]) 12/21/2019   Hepatic encephalopathy ([place]) 19/16/6060    Alcoholic liver disease ([place]) 12/21/2019   PCP:  Practice, [place]:   [facility] [address], [place] [address] [place] [address] Phone: [phone] Fax: [phone]     Social Determinants of Health (SDOH) Interventions    Readmission Risk Interventions Readmission Risk Prevention Plan 03/24/2021  Transportation Screening Complete  HRI or [place] Complete  Social Work Consult for [place] Planning/Counseling Complete  Palliative Care Screening Not Applicable  Medication Review Press photographer) Complete

## 2021-03-24 NOTE — Progress Notes (Signed)
Alyssa Carey  TDV:[id number] DOB: [birth year] DOA: 03/23/2021 PCP: Practice, [facility]    Brief Narrative:  50 year old with a history of HTN, necrotizing fasciitis requiring colostomy, and alcohol abuse who presented to the ER with a 1 month history of weakness with frequent falls, poor appetite, and a 20 pound weight loss over 6 weeks.  She related intermittent pain around the site of her colostomy and endorsed blood in the colostomy bag the day prior to her admission.  In the ED she was tachypneic and tachycardic.  She was found to be hypokalemic, hyponatremic, and hypoglycemic with elevated LFTs and an alcohol level of 205.  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  Consultants:  None  Code Status: FULL CODE  Antimicrobials:  None  DVT prophylaxis: SCDs  Subjective: The patient is being upgraded to a stepdown bed due to persisting tachycardia, hypotension, and tachypnea.  Saturations are holding stable at 100% on room air.  Continues to have intermittent episodes of significant hypoglycemia.  Hemoglobin dropping with hydration.  The patient is alert oriented and conversant.  She does not appear to be symptomatic from her hypotension.  She denies shortness of breath or chest pain.  She denies any further bleeding in her ostomy.  Assessment & Plan:  SIRS Persisting tachycardia, hypotension, and tachypnea - dose w/ stress dose hydrocortisone and watch for response - volume resuscitate - I worry about significant acute GIB - follow serial CBC  Transaminitis - ? Alcoholic hepatitis Viral hepatitis panel non-reactive - Maddrey's discriminant function calculates at 29.9 therefore no role for steroid for this diagnosis - improving w/ supportive care   Recent Labs  Lab 03/23/21 1750 03/24/21 0430  AST 235* 187*  ALT 63* 47*  ALKPHOS 356* 277*  BILITOT 15.4* 12.4*  PROT  7.5 5.9*  ALBUMIN 2.4* 1.8*    Anemia of acute blood loss v/s chronic anemia of alcoholism T&S notw - monitor CBC closely  Recent Labs  Lab 03/23/21 1750 03/24/21 0430  HGB 9.5* 7.7*     Elevated bilirubin  15.4 at presentation - due to above - follow trend   Alcoholism Denied consistent alcohol use but at presentation blood alcohol level was 205 - clear clinical stigmata of severe alcohol abuse - watch for withdrawal w/ CIWA  Generalized weakness - recurrent falls  Likely due to EtOH - PT/OT when more stable   Prolonged QTc -(539m) Likely related to multiple electrolyte abnormalities -correct all of the above and follow-up - watch on tele for now   Hypokalemia Due to alcoholism and inherent malnutrition -likely reflective of significant total body deficit -replace and follow  Recent Labs  Lab 03/23/21 1750 03/24/21 0430  K 3.1* 3.4*    Hypoglycemia Suspect this is reflective of significant liver dysfunction -administer dextrose (after thiamine) and IV fluid and follow  Hyponatremia Corrected with volume expansion -likely due to alcoholism/liver disease  Hypomagnesemia Correct with IV supplementation and monitor trend  Protein calorie malnutrition Due to alcoholism  HTN   Family Communication: No family present at time of exam Status is: Inpatient  Remains inpatient appropriate because:Inpatient level of care appropriate due to severity of illness  Dispo: The patient is from: Home              Anticipated d/c is to: Home              Patient currently is not  medically stable to d/c.   Difficult to place patient No   Objective: Blood pressure (!) 84/70, pulse (!) 127, temperature 98.2 F (36.8 C), resp. rate (!) 33, height 4' 9"  (1.448 m), weight 64.4 kg, SpO2 99 %.  Intake/Output Summary (Last 24 hours) at 03/24/2021 0717 Last data filed at 03/24/2021 0215 Gross per 24 hour  Intake 1000 ml  Output --  Net 1000 ml   Filed Weights   03/23/21 1638   Weight: 64.4 kg    Examination: General: No acute respiratory distress Lungs: Clear to auscultation bilaterally without wheezes or crackles Cardiovascular: Regular rate and rhythm without murmur gallop or rub normal S1 and S2 Abdomen: Distended, soft, significant hernia near her ostomy appreciable but nontender to touch, ostomy itself appears unremarkable except for hernia, bowel sounds positive Extremities: No significant cyanosis, clubbing, or edema bilateral lower extremities  CBC: Recent Labs  Lab 03/23/21 1750 03/24/21 0430  WBC 7.5 5.3  NEUTROABS 6.0  --   HGB 9.5* 7.7*  HCT 27.8* 23.1*  MCV 97.2 97.9  PLT 183 003*   Basic Metabolic Panel: Recent Labs  Lab 03/23/21 1750 03/24/21 0430  NA 133* 138  K 3.1* 3.4*  CL 97* 106  CO2 20* 19*  GLUCOSE 66* 35*  BUN <5* <5*  CREATININE <0.30* <0.30*  CALCIUM 8.6* 8.1*  MG  --  1.4*  PHOS  --  2.8   GFR: CrCl cannot be calculated (This lab value cannot be used to calculate CrCl because it is not a number: <0.30).  Liver Function Tests: Recent Labs  Lab 03/23/21 1750 03/24/21 0430  AST 235* 187*  ALT 63* 47*  ALKPHOS 356* 277*  BILITOT 15.4* 12.4*  PROT 7.5 5.9*  ALBUMIN 2.4* 1.8*   Recent Labs  Lab 03/23/21 1750  LIPASE 32    Coagulation Profile: Recent Labs  Lab 03/23/21 1750 03/24/21 0430  INR 1.2 1.3*    HbA1C: Hgb A1c MFr Bld  Date/Time Value Ref Range Status  12/24/2019 10:08 AM 5.7 (H) 4.8 - 5.6 % Final    Comment:    (NOTE) Pre diabetes:          5.7%-6.4% Diabetes:              >6.4% Glycemic control for   <7.0% adults with diabetes     CBG: Recent Labs  Lab 03/24/21 0529 03/24/21 0603  GLUCAP 24* 143*    Recent Results (from the past 240 hour(s))  Resp Panel by RT-PCR (Flu A&B, Covid) Nasopharyngeal Swab     Status: None   Collection Time: 03/23/21  8:00 PM   Specimen: Nasopharyngeal Swab; Nasopharyngeal(NP) swabs in vial transport medium  Result Value Ref Range Status    SARS Coronavirus 2 by RT PCR NEGATIVE NEGATIVE Final    Comment: (NOTE) SARS-CoV-2 target nucleic acids are NOT DETECTED.  The SARS-CoV-2 RNA is generally detectable in upper respiratory specimens during the acute phase of infection. The lowest concentration of SARS-CoV-2 viral copies this assay can detect is 138 copies/mL. A negative result does not preclude SARS-Cov-2 infection and should not be used as the sole basis for treatment or other patient management decisions. A negative result may occur with  improper specimen collection/handling, submission of specimen other than nasopharyngeal swab, presence of viral mutation(s) within the areas targeted by this assay, and inadequate number of viral copies(<138 copies/mL). A negative result must be combined with clinical observations, patient history, and epidemiological information. The expected result is Negative.  Fact Sheet for Patients:  EntrepreneurPulse.com.au  Fact Sheet for Healthcare Providers:  IncredibleEmployment.be  This test is no t yet approved or cleared by the [place] FDA and  has been authorized for detection and/or diagnosis of SARS-CoV-2 by FDA under an Emergency Use Authorization (EUA). This EUA will remain  in effect (meaning this test can be used) for the duration of the COVID-19 declaration under Section 564(b)(1) of the Act, 21 U.S.C.section 360bbb-3(b)(1), unless the authorization is terminated  or revoked sooner.       Influenza A by PCR NEGATIVE NEGATIVE Final   Influenza B by PCR NEGATIVE NEGATIVE Final    Comment: (NOTE) The Xpert Xpress SARS-CoV-2/FLU/RSV plus assay is intended as an aid in the diagnosis of influenza from Nasopharyngeal swab specimens and should not be used as a sole basis for treatment. Nasal washings and aspirates are unacceptable for Xpert Xpress SARS-CoV-2/FLU/RSV testing.  Fact Sheet for  Patients: EntrepreneurPulse.com.au  Fact Sheet for Healthcare Providers: IncredibleEmployment.be  This test is not yet approved or cleared by the [place] FDA and has been authorized for detection and/or diagnosis of SARS-CoV-2 by FDA under an Emergency Use Authorization (EUA). This EUA will remain in effect (meaning this test can be used) for the duration of the COVID-19 declaration under Section 564(b)(1) of the Act, 21 U.S.C. section 360bbb-3(b)(1), unless the authorization is terminated or revoked.  Performed at [facility], [address], [place], [place] [address]      Scheduled Meds:  feeding supplement  237 mL Oral BID BM   heparin  5,000 Units Subcutaneous Q8H     LOS: 1 day   Cherene Altes, MD [facility] Office  [phone] Pager - Text Page per Shea Evans  If 7PM-7AM, please contact night-coverage per Amion 03/24/2021, 7:17 AM

## 2021-03-24 NOTE — Plan of Care (Signed)
  Problem: Acute Rehab PT Goals(only PT should resolve) Goal: Pt Will Go Supine/Side To Sit Outcome: Progressing Flowsheets (Taken 03/24/2021 1021) Pt will go Supine/Side to Sit: with modified independence Goal: Patient Will Transfer Sit To/From Stand Outcome: Progressing Flowsheets (Taken 03/24/2021 1021) Patient will transfer sit to/from stand: with modified independence Goal: Pt Will Transfer Bed To Chair/Chair To Bed Outcome: Progressing Flowsheets (Taken 03/24/2021 1021) Pt will Transfer Bed to Chair/Chair to Bed: with modified independence Goal: Pt Will Ambulate Outcome: Progressing Flowsheets (Taken 03/24/2021 1021) Pt will Ambulate:  > 125 feet  with modified independence   10:22 AM, 03/24/21 Lonell Grandchild, MPT Physical Therapist with [facility] 336 [phone] office [phone] mobile phone

## 2021-03-25 DIAGNOSIS — Z7289 Other problems related to lifestyle: Secondary | ICD-10-CM

## 2021-03-25 DIAGNOSIS — D649 Anemia, unspecified: Secondary | ICD-10-CM

## 2021-03-25 DIAGNOSIS — B179 Acute viral hepatitis, unspecified: Secondary | ICD-10-CM

## 2021-03-25 LAB — PROTIME-INR
INR: 1.3 — ABNORMAL HIGH (ref 0.8–1.2)
Prothrombin Time: 16.4 seconds — ABNORMAL HIGH (ref 11.4–15.2)

## 2021-03-25 LAB — COMPREHENSIVE METABOLIC PANEL
ALT: 44 U/L (ref 0–44)
AST: 129 U/L — ABNORMAL HIGH (ref 15–41)
Albumin: 1.7 g/dL — ABNORMAL LOW (ref 3.5–5.0)
Alkaline Phosphatase: 254 U/L — ABNORMAL HIGH (ref 38–126)
Anion gap: 5 (ref 5–15)
BUN: <5 mg/dL — ABNORMAL LOW (ref 6–20)
CO2: 22 mmol/L (ref 22–32)
Calcium: 8.1 mg/dL — ABNORMAL LOW (ref 8.9–10.3)
Chloride: 105 mmol/L (ref 98–111)
Creatinine, Ser: <0.3 mg/dL — ABNORMAL LOW (ref 0.44–1.00)
Glucose, Bld: 220 mg/dL — ABNORMAL HIGH (ref 70–99)
Potassium: 4.4 mmol/L (ref 3.5–5.1)
Sodium: 132 mmol/L — ABNORMAL LOW (ref 135–145)
Total Bilirubin: 11.9 mg/dL — ABNORMAL HIGH (ref 0.3–1.2)
Total Protein: 5.7 g/dL — ABNORMAL LOW (ref 6.5–8.1)

## 2021-03-25 LAB — CBC
HCT: 21.1 % — ABNORMAL LOW (ref 36.0–46.0)
HCT: 26.5 % — ABNORMAL LOW (ref 36.0–46.0)
Hemoglobin: 7.1 g/dL — ABNORMAL LOW (ref 12.0–15.0)
Hemoglobin: 8.6 g/dL — ABNORMAL LOW (ref 12.0–15.0)
MCH: 33.5 pg (ref 26.0–34.0)
MCH: 33.5 pg (ref 26.0–34.0)
MCHC: 33.6 g/dL (ref 30.0–36.0)
MCHC: 32.5 g/dL (ref 30.0–36.0)
MCV: 99.5 fL (ref 80.0–100.0)
MCV: 103.1 fL — ABNORMAL HIGH (ref 80.0–100.0)
Platelets: 119 10*3/uL — ABNORMAL LOW (ref 150–400)
Platelets: 131 10*3/uL — ABNORMAL LOW (ref 150–400)
RBC: 2.12 MIL/uL — ABNORMAL LOW (ref 3.87–5.11)
RBC: 2.57 MIL/uL — ABNORMAL LOW (ref 3.87–5.11)
RDW: 16.1 % — ABNORMAL HIGH (ref 11.5–15.5)
RDW: 16.4 % — ABNORMAL HIGH (ref 11.5–15.5)
WBC: 6 10*3/uL (ref 4.0–10.5)
WBC: 8.2 10*3/uL (ref 4.0–10.5)
nRBC: 0 % (ref 0.0–0.2)
nRBC: 0 % (ref 0.0–0.2)

## 2021-03-25 LAB — HEMOGLOBIN A1C
Hgb A1c MFr Bld: 4.5 % — ABNORMAL LOW (ref 4.8–5.6)
Mean Plasma Glucose: 82.45 mg/dL

## 2021-03-25 LAB — ANTI-SMOOTH MUSCLE ANTIBODY, IGG: F-Actin IgG: 15 Units (ref 0–19)

## 2021-03-25 LAB — GLUCOSE, CAPILLARY
Glucose-Capillary: 178 mg/dL — ABNORMAL HIGH (ref 70–99)
Glucose-Capillary: 163 mg/dL — ABNORMAL HIGH (ref 70–99)
Glucose-Capillary: 165 mg/dL — ABNORMAL HIGH (ref 70–99)
Glucose-Capillary: 180 mg/dL — ABNORMAL HIGH (ref 70–99)

## 2021-03-25 LAB — IGG, IGA, IGM
IgA: 1395 mg/dL — ABNORMAL HIGH (ref 87–352)
IgG (Immunoglobin G), Serum: 1774 mg/dL — ABNORMAL HIGH (ref 586–1602)
IgM (Immunoglobulin M), Srm: 156 mg/dL (ref 26–217)

## 2021-03-25 LAB — ANA W/REFLEX IF POSITIVE: Anti Nuclear Antibody (ANA): NEGATIVE

## 2021-03-25 LAB — MITOCHONDRIAL ANTIBODIES: Mitochondrial M2 Ab, IgG: <20 Units (ref 0.0–20.0)

## 2021-03-25 LAB — MAGNESIUM: Magnesium: 2.3 mg/dL (ref 1.7–2.4)

## 2021-03-25 MED ORDER — ACETAMINOPHEN 325 MG PO TABS
650.0000 mg | ORAL_TABLET | Freq: Four times a day (QID) | ORAL | Status: DC | PRN
  Administered 2021-03-25: 650 mg via ORAL
  Filled 2021-03-25: qty 2

## 2021-03-25 MED ORDER — TRAZODONE HCL 50 MG PO TABS
50.0000 mg | ORAL_TABLET | Freq: Once | ORAL | Status: AC
  Administered 2021-03-25: 50 mg via ORAL
  Filled 2021-03-25: qty 1

## 2021-03-25 MED ORDER — LOPERAMIDE HCL 2 MG PO CAPS
2.0000 mg | ORAL_CAPSULE | ORAL | Status: DC | PRN
  Administered 2021-03-25 – 2021-03-26 (×2): 2 mg via ORAL
  Filled 2021-03-25 (×2): qty 1

## 2021-03-25 NOTE — Progress Notes (Signed)
Physical Therapy Treatment Patient Details Name: Alyssa Carey MRN: [id number] DOB: Aug 31, 1970 Today's Date: 03/25/2021    History of Present Illness Alyssa Carey is a 50 y.o. female with medical history significant for hypertension, necrotizing fasciitis requiring colostomy, alcohol dependence who presents to the emergency department due to 1 month history of weakness and frequent falls, she complained of decreased appetite with a loss of more than 20 pounds within the last 6 weeks.  She complained of intermittent pain around the colostomy and endorsed seeing some blood in colostomy bag yesterday.  Patient states that she drinks wine occasionally and that last alcohol intake was about a month ago.  She complained of nausea but no vomiting.  She denies fever, chills, chest pain, shortness of breath or IV drug use.    PT Comments    Patient agreeable to performing exercises in bed but did not wish to ambulate today. Patient does not require assist with bed mobility. She demonstrates good sitting tolerance and sitting balance EOB while performing seated exercises. Patient returned to supine at end of session with slow, labored movements. Patient will benefit from continued physical therapy in hospital and recommended venue below to increase strength, balance, endurance for safe ADLs and gait.   Follow Up Recommendations  No PT follow up;Supervision - Intermittent     Equipment Recommendations  None recommended by PT    Recommendations for Other Services       Precautions / Restrictions Precautions Precautions: None Restrictions Weight Bearing Restrictions: No    Mobility  Bed Mobility Overal bed mobility: Modified Independent             General bed mobility comments: increased time, labored movement    Transfers                    Ambulation/Gait                 Stairs             Wheelchair Mobility    Modified Rankin (Stroke Patients  Only)       Balance Overall balance assessment: No apparent balance deficits (not formally assessed)                                          Cognition Arousal/Alertness: Awake/alert Behavior During Therapy: WFL for tasks assessed/performed Overall Cognitive Status: Within Functional Limits for tasks assessed                                        Exercises General Exercises - Lower Extremity Ankle Circles/Pumps: AROM;Both;20 reps;Seated Long Arc Quad: AROM;Both;20 reps;Seated Hip Flexion/Marching: AROM;Both;20 reps;Seated    General Comments        Pertinent Vitals/Pain Pain Assessment: No/denies pain    Home Living                      Prior Function            PT Goals (current goals can now be found in the care plan section) Acute Rehab PT Goals Patient Stated Goal: return home with family to assist PT Goal Formulation: With patient Time For Goal Achievement: 03/26/21 Potential to Achieve Goals: Good Progress towards PT goals: Progressing toward goals    Frequency  Min 2X/week      PT Plan Current plan remains appropriate    Co-evaluation              AM-PAC PT "6 Clicks" Mobility   Outcome Measure  Help needed turning from your back to your side while in a flat bed without using bedrails?: None Help needed moving from lying on your back to sitting on the side of a flat bed without using bedrails?: A Little Help needed moving to and from a bed to a chair (including a wheelchair)?: None Help needed standing up from a chair using your arms (e.g., wheelchair or bedside chair)?: None Help needed to walk in hospital room?: A Little Help needed climbing 3-5 steps with a railing? : A Little 6 Click Score: 21    End of Session   Activity Tolerance: Patient tolerated treatment well;Patient limited by fatigue Patient left: with call bell/phone within reach;in bed Nurse Communication: Mobility status PT  Visit Diagnosis: Unsteadiness on feet (R26.81);Other abnormalities of gait and mobility (R26.89);Muscle weakness (generalized) (M62.81)     Time: 3329-5188 PT Time Calculation (min) (ACUTE ONLY): 11 min  Charges:  $Therapeutic Exercise: 8-22 mins                     11:14 AM, 03/25/21 Mearl Latin PT, DPT Physical Therapist at [facility]

## 2021-03-25 NOTE — Progress Notes (Signed)
Alyssa Carey  DJM:[id number] DOB: [birth year] DOA: 03/23/2021 PCP: [facility]    Brief Narrative:  50 year old with a history of HTN, necrotizing fasciitis requiring colostomy, and alcohol abuse who presented to the ER with a 1 month history of weakness with frequent falls, poor appetite, and a 20 pound weight loss over 6 weeks.  She related intermittent pain around the site of her colostomy and endorsed blood in the colostomy bag the day prior to her admission.  In the ED she was tachypneic and tachycardic.  She was found to be hypokalemic, hyponatremic, and hypoglycemic with elevated LFTs and an alcohol level of 205.  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  Consultants:  None  Code Status: FULL CODE  Antimicrobials:  None  DVT prophylaxis: SCDs  Subjective: BP has improved, as have tachycardia and tachypnea. CBG stable/elevated. Denies new complaints. Is axious to begin regular food asap. Denies cp, n/v, or abdom pain.   Assessment & Plan:  SIRS SIRS physiology resolving - likely due to dehydration as well as EtOH withdrawal - no evidence of acute infection - no evidence of signif bleeding at this time   Transaminitis - ? Alcoholic hepatitis Viral hepatitis panel non-reactive - Maddrey's discriminant function calculates at 32 today, w/ GI to advise on starting ongoing steroid or not - improving w/ supportive care otherwise - pattern of elevation most c/w EtOH induced injury   Recent Labs  Lab 03/23/21 1750 03/24/21 0430 03/25/21 0445  AST 235* 187* 129*  ALT 63* 47* 44  ALKPHOS 356* 277* 254*  BILITOT 15.4* 12.4* 11.9*  PROT 7.5 5.9* 5.7*  ALBUMIN 2.4* 1.8* 1.7*     Anemia of acute blood loss v/s chronic anemia of alcoholism Hgb falling, but w/ no evidence of ongoing bleeding - suspect this is dilutional - follow in serial fashion - transfuse if  drops below 7.0   Recent Labs  Lab 03/23/21 1750 03/24/21 0430 03/24/21 1152 03/25/21 0445  HGB 9.5* 7.7* 8.4* 7.1*     Alcoholism Denied consistent alcohol use but at presentation blood alcohol level was 205 - clear clinical stigmata of severe alcohol abuse   Generalized weakness - recurrent falls  Likely due to EtOH - PT/OT when more stable   Prolonged QTc -(550m) Likely related to multiple electrolyte abnormalities -correct all of the above and follow-up - check 12 lead today   Hypokalemia Due to alcoholism and inherent malnutrition -likely reflective of significant total body deficit - corrected w/ supplementation   Hypoglycemia Suspect this is reflective of significant liver dysfunction - corrected w/ dextrose in IVF - stop D5 and follow   Hyponatremia Correcting with volume expansion - likely due to alcoholism/liver disease  Hypomagnesemia Corrected with IV supplementation   Protein calorie malnutrition Due to alcoholism   Family Communication: No family present at time of exam Status is: Inpatient  Remains inpatient appropriate because:Inpatient level of care appropriate due to severity of illness  Dispo: The patient is from: Home              Anticipated d/c is to: Home              Patient currently is not medically stable to d/c.   Difficult to place patient No   Objective: Blood pressure 101/65, pulse 95, temperature 98.6 F (37 C), temperature source Oral, resp. rate 20, height 4' 9"  (1.448 m), weight  71.5 kg, SpO2 99 %.  Intake/Output Summary (Last 24 hours) at 03/25/2021 1035 Last data filed at 03/25/2021 0930 Gross per 24 hour  Intake 1561.57 ml  Output 150 ml  Net 1411.57 ml    Filed Weights   03/23/21 1638 03/24/21 1621 03/25/21 0405  Weight: 64.4 kg 70.3 kg 71.5 kg    Examination: General: No acute respiratory distress Lungs: Clear to auscultation bilaterally  Cardiovascular: Regular rate and rhythm without murmur  Abdomen:  Distended, soft, significant hernia near her ostomy appreciable but nontender to touch, ostomy itself appears unremarkable except for hernia, bowel sounds positive - no change since yesterday  Extremities: No significant C/C/E B LE   CBC: Recent Labs  Lab 03/23/21 1750 03/24/21 0430 03/24/21 1152 03/25/21 0445  WBC 7.5 5.3 8.1 6.0  NEUTROABS 6.0  --   --   --   HGB 9.5* 7.7* 8.4* 7.1*  HCT 27.8* 23.1* 25.6* 21.1*  MCV 97.2 97.9 99.6 99.5  PLT 183 128* 132* 119*    Basic Metabolic Panel: Recent Labs  Lab 03/23/21 1750 03/24/21 0430 03/25/21 0445  NA 133* 138 132*  K 3.1* 3.4* 4.4  CL 97* 106 105  CO2 20* 19* 22  GLUCOSE 66* 35* 220*  BUN <5* <5* <5*  CREATININE <0.30* <0.30* <0.30*  CALCIUM 8.6* 8.1* 8.1*  MG  --  1.4* 2.3  PHOS  --  2.8  --     GFR: CrCl cannot be calculated (This lab value cannot be used to calculate CrCl because it is not a number: <0.30).  Liver Function Tests: Recent Labs  Lab 03/23/21 1750 03/24/21 0430 03/25/21 0445  AST 235* 187* 129*  ALT 63* 47* 44  ALKPHOS 356* 277* 254*  BILITOT 15.4* 12.4* 11.9*  PROT 7.5 5.9* 5.7*  ALBUMIN 2.4* 1.8* 1.7*    Recent Labs  Lab 03/23/21 1750  LIPASE 32     Coagulation Profile: Recent Labs  Lab 03/23/21 1750 03/24/21 0430 03/25/21 0445  INR 1.2 1.3* 1.3*     HbA1C: Hgb A1c MFr Bld  Date/Time Value Ref Range Status  12/24/2019 10:08 AM 5.7 (H) 4.8 - 5.6 % Final    Comment:    (NOTE) Pre diabetes:          5.7%-6.4% Diabetes:              >6.4% Glycemic control for   <7.0% adults with diabetes     CBG: Recent Labs  Lab 03/24/21 0825 03/24/21 1323 03/24/21 1620 03/24/21 2100 03/25/21 0739  GLUCAP 168* 240* 322* 330* 178*     Recent Results (from the past 240 hour(s))  Resp Panel by RT-PCR (Flu A&B, Covid) Nasopharyngeal Swab     Status: None   Collection Time: 03/23/21  8:00 PM   Specimen: Nasopharyngeal Swab; Nasopharyngeal(NP) swabs in vial transport medium   Result Value Ref Range Status   SARS Coronavirus 2 by RT PCR NEGATIVE NEGATIVE Final    Comment: (NOTE) SARS-CoV-2 target nucleic acids are NOT DETECTED.  The SARS-CoV-2 RNA is generally detectable in upper respiratory specimens during the acute phase of infection. The lowest concentration of SARS-CoV-2 viral copies this assay can detect is 138 copies/mL. A negative result does not preclude SARS-Cov-2 infection and should not be used as the sole basis for treatment or other patient management decisions. A negative result may occur with  improper specimen collection/handling, submission of specimen other than nasopharyngeal swab, presence of viral mutation(s) within the areas targeted by this  assay, and inadequate number of viral copies(<138 copies/mL). A negative result must be combined with clinical observations, patient history, and epidemiological information. The expected result is Negative.  Fact Sheet for Patients:  EntrepreneurPulse.com.au  Fact Sheet for Healthcare Providers:  IncredibleEmployment.be  This test is no t yet approved or cleared by the [place] FDA and  has been authorized for detection and/or diagnosis of SARS-CoV-2 by FDA under an Emergency Use Authorization (EUA). This EUA will remain  in effect (meaning this test can be used) for the duration of the COVID-19 declaration under Section 564(b)(1) of the Act, 21 U.S.C.section 360bbb-3(b)(1), unless the authorization is terminated  or revoked sooner.       Influenza A by PCR NEGATIVE NEGATIVE Final   Influenza B by PCR NEGATIVE NEGATIVE Final    Comment: (NOTE) The Xpert Xpress SARS-CoV-2/FLU/RSV plus assay is intended as an aid in the diagnosis of influenza from Nasopharyngeal swab specimens and should not be used as a sole basis for treatment. Nasal washings and aspirates are unacceptable for Xpert Xpress SARS-CoV-2/FLU/RSV testing.  Fact Sheet for  Patients: EntrepreneurPulse.com.au  Fact Sheet for Healthcare Providers: IncredibleEmployment.be  This test is not yet approved or cleared by the [place] FDA and has been authorized for detection and/or diagnosis of SARS-CoV-2 by FDA under an Emergency Use Authorization (EUA). This EUA will remain in effect (meaning this test can be used) for the duration of the COVID-19 declaration under Section 564(b)(1) of the Act, 21 U.S.C. section 360bbb-3(b)(1), unless the authorization is terminated or revoked.  Performed at [facility], [address], [place], [place] [address]   MRSA Next Gen by PCR, Nasal     Status: None   Collection Time: 03/24/21  4:20 PM   Specimen: Nasal Mucosa; Nasal Swab  Result Value Ref Range Status   MRSA by PCR Next Gen NOT DETECTED NOT DETECTED Final    Comment: (NOTE) The GeneXpert MRSA Assay (FDA approved for NASAL specimens only), is one component of a comprehensive MRSA colonization surveillance program. It is not intended to diagnose MRSA infection nor to guide or monitor treatment for MRSA infections. Test performance is not FDA approved in patients less than 64 years old. Performed at [facility], [address] [place], [place] [address]       Scheduled Meds:  Chlorhexidine Gluconate Cloth  6 each Topical Daily   famotidine  20 mg Oral BID   folic acid  1 mg Oral Daily   multivitamin with minerals  1 tablet Oral Daily   thiamine injection  100 mg Intravenous Daily     LOS: 2 days   Cherene Altes, MD [facility] Office  [phone] Pager - Text Page per Shea Evans  If 7PM-7AM, please contact night-coverage per Amion 03/25/2021, 10:35 AM

## 2021-03-25 NOTE — Progress Notes (Signed)
Subjective: Feeling a little better today.  She denies abdominal pain, nausea, vomiting, bright red blood per ostomy or melena.  She has been tolerating clear liquids very well.  She has no disorientation/change in mental status.  When discussing history of nausea/lack of appetite/occasional vomiting, she reports that since her mother passed in 2001, she has been struggling with this.  It has been somewhat worse recently.  Drinks boost to help with nutrition. She denies associated abdominal pain with eating.  Her only abdominal pain is intermittent, short-lived, and at her stoma site. States she just does not have an appetite.  When she forces herself to eat, it creates some nausea and occasional vomiting.  Denies hematemesis.  Denies reflux symptoms.  While on clear liquid this admission, she has not had any of the symptoms.  She would like to advance her diet.  Objective: Vital signs in last 24 hours: Temp:  [98 F (36.7 C)-98.9 F (37.2 C)] 98.6 F (37 C) (09/09 0700) Pulse Rate:  [25-126] 101 (09/09 0900) Resp:  [20-36] 20 (09/09 0900) BP: (83-113)/(38-81) 99/68 (09/09 0900) SpO2:  [92 %-100 %] 100 % (09/09 0900) Weight:  [70.3 kg-71.5 kg] 71.5 kg (09/09 0405) Last BM Date: 03/25/21 General:   Alert and oriented, pleasant, no acute distress. Head:  Normocephalic and atraumatic. Eyes:  + Scleral icterus.  Conjuctiva pink.  Abdomen:  Bowel sounds present, soft, non-tender, non-distended. No HSM or hernias noted. No rebound or guarding.  Colostomy bag in place, stoma red with no blood on stoma or in colostomy bag.  Stool is dark brown/green. Msk:  Symmetrical without gross deformities. Normal posture. Extremities:  Without edema. Neurologic:  Alert and  oriented x4;  grossly normal neurologically. Skin:  Warm and dry, intact without significant lesions.  Psych:  Normal mood and affect.  Intake/Output from previous day: 09/08 0701 - 09/09 0700 In: 1931.6 [I.V.:1431.6; IV  Piggyback:500] Out: 150 [Stool:150] Intake/Output this shift: Total I/O In: 796.7 [I.V.:796.7] Out: -   Lab Results: Recent Labs    03/24/21 0430 03/24/21 1152 03/25/21 0445  WBC 5.3 8.1 6.0  HGB 7.7* 8.4* 7.1*  HCT 23.1* 25.6* 21.1*  PLT 128* 132* 119*   BMET Recent Labs    03/23/21 1750 03/24/21 0430 03/25/21 0445  NA 133* 138 132*  K 3.1* 3.4* 4.4  CL 97* 106 105  CO2 20* 19* 22  GLUCOSE 66* 35* 220*  BUN <5* <5* <5*  CREATININE <0.30* <0.30* <0.30*  CALCIUM 8.6* 8.1* 8.1*   LFT Recent Labs    03/23/21 1750 03/24/21 0430 03/24/21 1245 03/25/21 0445  PROT 7.5 5.9*  --  5.7*  ALBUMIN 2.4* 1.8*  --  1.7*  AST 235* 187*  --  129*  ALT 63* 47*  --  44  ALKPHOS 356* 277*  --  254*  BILITOT 15.4* 12.4*  --  11.9*  BILIDIR  --   --  8.8*  --    PT/INR Recent Labs    03/24/21 0430 03/25/21 0445  LABPROT 15.8* 16.4*  INR 1.3* 1.3*   Hepatitis Panel Recent Labs    03/23/21 1750  HEPBSAG NON REACTIVE  HCVAB NON REACTIVE  HEPAIGM NON REACTIVE  HEPBIGM NON REACTIVE    Studies/Results: CT Abdomen Pelvis W Contrast  Result Date: 03/23/2021 CLINICAL DATA:  Acute hepatitis. EXAM: CT ABDOMEN AND PELVIS WITH CONTRAST TECHNIQUE: Multidetector CT imaging of the abdomen and pelvis was performed using the standard protocol following bolus administration of intravenous contrast.  CONTRAST:  69m OMNIPAQUE IOHEXOL 350 MG/ML SOLN COMPARISON:  CT abdomen and pelvis 01/27/2021. FINDINGS: Lower chest: No acute abnormality. Hepatobiliary: The liver is markedly enlarged, similar to the prior study. There is marked diffuse fatty infiltration of the liver, also unchanged. No focal liver lesions are identified. Gallstones are present. There is no biliary ductal dilatation. Pancreas: Unremarkable. No pancreatic ductal dilatation or surrounding inflammatory changes. Spleen: Normal in size without focal abnormality. Adrenals/Urinary Tract: The kidneys and adrenal glands are within  normal limits. The bladder is decompressed. No bladder calculi are seen. Stomach/Bowel: Stomach is within normal limits. Appendix is not seen. No evidence of bowel wall thickening, distention, or inflammatory changes. Left-sided ileostomy is again noted with large peristomal hernia. Vascular/Lymphatic: No significant vascular findings are present. No enlarged abdominal or pelvic lymph nodes. Reproductive: Uterus and bilateral adnexa are unremarkable. Other: Again seen is a large left-sided parastomal hernia containing nondilated colon and small bowel. Wall defect measures 6.0 by 4.2 cm similar to the prior examination. This is similar to prior. There is some mild diffuse mesenteric edema. There is no ascites or free air. There is also small fat containing umbilical hernia. Musculoskeletal: No acute or significant osseous findings. IMPRESSION: 1. Again seen is a left lower quadrant ileostomy with large parastomal hernia containing nondilated bowel loops. There is no evidence for bowel obstruction. There is mild nonspecific mesenteric edema diffusely. 2. Hepatomegaly and hepatic steatosis changes are similar to the prior study. 3. Cholelithiasis. Electronically Signed   By: ARonney AstersM.D.   On: 03/23/2021 21:51    Assessment: 50year old female with history of alcohol abuse, necrotizing fasciitis status post diverting colostomy in June 2021, history of GI bleed in March 2022 who presented to the ED on 9/7 with generalized weakness, decreased oral intake, nausea, 20 pound weight loss, small amount of blood in her ostomy bag, abdominal pain.  GI consulted for abnormal LFTs.  Elevated LFTs: History of elevated AST/ALT/alk phos with persistent elevation dating back to March.  Bilirubin had been normal.  Presented this admission with total bili of 15.4, alk phos 356, AST 235, ALT 63.  Bilirubin fractionated yesterday, primarily direct.  Today total bilirubin 11.9, alk phos 254, AST 129, ALT 44.  INR 1.3.  CT A/P  with contrast this admission with evidence of fatty liver.  Overall, suspected presentation to be secondary to alcoholic hepatitis.  She denies ongoing alcohol use, but does admit to drinking over Labor Day weekend.  To be thorough, additional serologies have been ordered.  Ferritin elevated at 1343, saturation elevated at 90%.  Suspect this may be an acute reactant rather than hemochromatosis presentation.  We will need to follow this outpatient.  IgG mildly elevated at 1774, IgA elevated at 1395, IgM within normal limits.  ANA, ASMA, AMA in process.  Acute hepatitis panel negative.  We will need to follow-up on pending serologies.  Hepatic DF using control PT of 12 seconds along with today's total bilirubin was 32.1 (29.9 yesterday), which just qualifies her for possibly benefiting from prednisolone. Clinically she is doing well without signs or symptoms of  HE and no overt GI bleeding. Will discuss with Dr. CAbbey Chatters    Chronically with poor appetite, nausea with 20+ pound weight loss over the last 1.5 years.  11 pound weight loss between February-May this year, 3 pound weight loss since May. Recent updated colonoscopy and EGD with resolution of rectal ulcer and gastric ulcer.  No evidence of persistent colitis.  Poor appetite may  be driven by ongoing alcohol abuse, alcoholic hepatitis, prior acute illness requiring hospitalizations.  Depression related to loss of mother possibly influencing her symptoms.  She describes lack of appetite, nausea/vomiting if forcing herself to eat.  Denies any associated abdominal pain with meals.  Only with intermittent, sharp, short-lived pain at site of parastomal hernia.  CT this admission with nondilated bowel loops and mild nonspecific mesenteric edema diffusely.  No evidence of bowel obstruction.  Colostomy reversal has been postponed due to poor nutrition.  Normocytic anemia: Hemoglobin 9.5 on admission>> 7.7>> 8.4>> 7.1 today.  No overt GI bleeding.  Suspect anemia of  chronic disease with hemodilution effect.  Ferritin and iron saturation elevated this admission, possibly acute phase reactant.  Vitamin B12 elevated.  Folate within normal limits. EGD and colonoscopy up-to-date. Denies NSAIDs. Continue to monitor and transfuse as necessary.    Prolonged QTC   Plan: 1.  Will discuss role of prednisolone with Dr. Abbey Chatters. 2.  Follow-up on pending liver serologies. 3.  Monitor LFTs and INR daily. 4.  Strict alcohol avoidance. 5.  Advance to full liquid diet.  6.  Continue Pepcid. 7.  May benefit from appetite stimulant. 8.  Continue to monitor H&H.  Will repeat at noon today. Transfuse as needed. 9.  Continue supportive measures.      LOS: 2 days    03/25/2021, 9:09 AM   Aliene Altes, [facility]

## 2021-03-26 LAB — PROTIME-INR
INR: 1.3 — ABNORMAL HIGH (ref 0.8–1.2)
Prothrombin Time: 16.5 seconds — ABNORMAL HIGH (ref 11.4–15.2)

## 2021-03-26 LAB — CBC
HCT: 31.2 % — ABNORMAL LOW (ref 36.0–46.0)
Hemoglobin: 10 g/dL — ABNORMAL LOW (ref 12.0–15.0)
MCH: 33.1 pg (ref 26.0–34.0)
MCHC: 32.1 g/dL (ref 30.0–36.0)
MCV: 103.3 fL — ABNORMAL HIGH (ref 80.0–100.0)
Platelets: 76 10*3/uL — ABNORMAL LOW (ref 150–400)
RBC: 3.02 MIL/uL — ABNORMAL LOW (ref 3.87–5.11)
RDW: 16.7 % — ABNORMAL HIGH (ref 11.5–15.5)
WBC: 4.5 10*3/uL (ref 4.0–10.5)
nRBC: 0 % (ref 0.0–0.2)

## 2021-03-26 LAB — COMPREHENSIVE METABOLIC PANEL
ALT: 39 U/L (ref 0–44)
AST: 85 U/L — ABNORMAL HIGH (ref 15–41)
Albumin: 1.6 g/dL — ABNORMAL LOW (ref 3.5–5.0)
Alkaline Phosphatase: 217 U/L — ABNORMAL HIGH (ref 38–126)
Anion gap: 3 — ABNORMAL LOW (ref 5–15)
BUN: <5 mg/dL — ABNORMAL LOW (ref 6–20)
CO2: 19 mmol/L — ABNORMAL LOW (ref 22–32)
Calcium: 7.8 mg/dL — ABNORMAL LOW (ref 8.9–10.3)
Chloride: 109 mmol/L (ref 98–111)
Creatinine, Ser: 0.3 mg/dL — ABNORMAL LOW (ref 0.44–1.00)
GFR, Estimated: >60 mL/min (ref >60)
Glucose, Bld: 126 mg/dL — ABNORMAL HIGH (ref 70–99)
Potassium: 3.8 mmol/L (ref 3.5–5.1)
Sodium: 131 mmol/L — ABNORMAL LOW (ref 135–145)
Total Bilirubin: 9.6 mg/dL — ABNORMAL HIGH (ref 0.3–1.2)
Total Protein: 5.2 g/dL — ABNORMAL LOW (ref 6.5–8.1)

## 2021-03-26 LAB — GLUCOSE, CAPILLARY
Glucose-Capillary: 114 mg/dL — ABNORMAL HIGH (ref 70–99)
Glucose-Capillary: 123 mg/dL — ABNORMAL HIGH (ref 70–99)
Glucose-Capillary: 145 mg/dL — ABNORMAL HIGH (ref 70–99)

## 2021-03-26 LAB — PHOSPHORUS: Phosphorus: 0.9 mg/dL — CL (ref 2.5–4.6)

## 2021-03-26 LAB — MAGNESIUM: Magnesium: 2 mg/dL (ref 1.7–2.4)

## 2021-03-26 MED ORDER — K PHOS MONO-SOD PHOS DI & MONO 155-852-130 MG PO TABS
250.0000 mg | ORAL_TABLET | Freq: Two times a day (BID) | ORAL | Status: DC
  Administered 2021-03-26: 250 mg via ORAL
  Filled 2021-03-26: qty 1

## 2021-03-26 MED ORDER — ACETAMINOPHEN 500 MG PO TABS
500.0000 mg | ORAL_TABLET | Freq: Four times a day (QID) | ORAL | Status: DC | PRN
  Administered 2021-03-26 – 2021-03-29 (×4): 500 mg via ORAL
  Filled 2021-03-26 (×5): qty 1

## 2021-03-26 MED ORDER — TRAZODONE HCL 50 MG PO TABS
50.0000 mg | ORAL_TABLET | Freq: Every evening | ORAL | Status: DC | PRN
  Administered 2021-03-27 – 2021-03-28 (×3): 50 mg via ORAL
  Filled 2021-03-26 (×3): qty 1

## 2021-03-26 MED ORDER — SODIUM CHLORIDE 0.9 % IV BOLUS
500.0000 mL | Freq: Once | INTRAVENOUS | Status: AC
  Administered 2021-03-26: 500 mL via INTRAVENOUS

## 2021-03-26 MED ORDER — K PHOS MONO-SOD PHOS DI & MONO 155-852-130 MG PO TABS
250.0000 mg | ORAL_TABLET | Freq: Every day | ORAL | Status: DC
  Administered 2021-03-26: 250 mg via ORAL
  Filled 2021-03-26 (×2): qty 1

## 2021-03-26 NOTE — Progress Notes (Signed)
RN called due to patient's BP at 84/41 with a MAP of 54, patient was asymptomatic.  IV NS bolus 500 mL x 1 was given.  Phosphorus was low at 0.9, patient was started on K-Phos

## 2021-03-26 NOTE — Progress Notes (Signed)
Subjective: Patient states she is feeling better today.  States she is on a soft diet and tolerating without any problems.  She is excited that she was able to eat almost her entire breakfast without any problems.  No abdominal pain.  No complaints  Objective: Vital signs in last 24 hours: Temp:  [98.1 F (36.7 C)-98.5 F (36.9 C)] 98.5 F (36.9 C) (09/10 1119) Pulse Rate:  [87-119] 100 (09/10 1119) Resp:  [17-37] 29 (09/10 1119) BP: (84-111)/(41-80) 110/73 (09/10 1100) SpO2:  [94 %-100 %] 96 % (09/10 1119) Last BM Date: 03/25/21 General:   Alert and oriented, pleasant Head:  Normocephalic and atraumatic. Eyes: Scleral icterus.  Abdomen:  Bowel sounds present, soft, non-tender, non-distended. No HSM or hernias noted. No rebound or guarding. No masses appreciated  Msk:  Symmetrical without gross deformities. Normal posture. Extremities:  Without clubbing or edema. Neurologic:  Alert and  oriented x4;  grossly normal neurologically. Skin:  Warm and dry, intact without significant lesions.  Cervical Nodes:  No significant cervical adenopathy. Psych:  Alert and cooperative. Normal mood and affect.  Intake/Output from previous day: 09/09 0701 - 09/10 0700 In: 2516.5 [P.O.:340; I.V.:2093.4; IV Piggyback:83] Out: 600 [Urine:550; Stool:50] Intake/Output this shift: Total I/O In: 779.6 [P.O.:360; I.V.:419.6] Out: -   Lab Results: Recent Labs    03/25/21 0445 03/25/21 1155 03/26/21 0307  WBC 6.0 8.2 4.5  HGB 7.1* 8.6* 10.0*  HCT 21.1* 26.5* 31.2*  PLT 119* 131* 76*   BMET Recent Labs    03/24/21 0430 03/25/21 0445 03/26/21 0307  NA 138 132* 131*  K 3.4* 4.4 3.8  CL 106 105 109  CO2 19* 22 19*  GLUCOSE 35* 220* 126*  BUN <5* <5* <5*  CREATININE <0.30* <0.30* 0.30*  CALCIUM 8.1* 8.1* 7.8*   LFT Recent Labs    03/24/21 0430 03/24/21 1245 03/25/21 0445 03/26/21 0307  PROT 5.9*  --  5.7* 5.2*  ALBUMIN 1.8*  --  1.7* 1.6*  AST 187*  --  129* 85*  ALT 47*  --  44  39  ALKPHOS 277*  --  254* 217*  BILITOT 12.4*  --  11.9* 9.6*  BILIDIR  --  8.8*  --   --    PT/INR Recent Labs    03/24/21 0430 03/25/21 0445  LABPROT 15.8* 16.4*  INR 1.3* 1.3*   Hepatitis Panel Recent Labs    03/23/21 1750  HEPBSAG NON REACTIVE  HCVAB NON REACTIVE  HEPAIGM NON REACTIVE  HEPBIGM NON REACTIVE     Studies/Results: No results found.  Assessment: *Acute alcoholic hepatitis *Abnormal LFTs due to above *Nausea-improved *Weight loss  Plan: Patient's bilirubin improved to 9 today.  No INR to compare or calculate Madrey's discriminant function.  We will check LFTs and coags in the a.m.  Continue to hold off on steroids for now.  Once again counseled patient on the vast importance of strict alcohol cessation and she understands.  Tolerating soft diet today.  States she eats the majority of her breakfast.  States she feels the best she has in a long time in regards to her appetite.  Otherwise continue supportive care.  Elon Alas. Abbey Chatters, D.O. Gastroenterology and Hepatology [facility]   LOS: 3 days    03/26/2021, 1:25 PM

## 2021-03-26 NOTE — Progress Notes (Signed)
Alyssa Carey  QMV:[id number] DOB: 06-04-1971 DOA: 03/23/2021 PCP: [facility]    Brief Narrative:  50 year old with a history of HTN, necrotizing fasciitis requiring colostomy, and alcohol abuse who presented to the ER with a 1 month history of weakness with frequent falls, poor appetite, and a 20 pound weight loss over 6 weeks.  She related intermittent pain around the site of her colostomy and endorsed blood in the colostomy bag the day prior to her admission.  In the ED she was tachypneic and tachycardic.  She was found to be hypokalemic, hyponatremic, and hypoglycemic with elevated LFTs and an alcohol level of 205.  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  Consultants:  None  Code Status: FULL CODE  Antimicrobials:  None  DVT prophylaxis: SCDs  Subjective: Had some transient hypotension last night that improved w/ volume expansion. Tachycardia resolved. Tolerated soft diet for breakfast w/o issue. Has been up in room some. Denies cp, n/v, or abdom pain.   Assessment & Plan:  SIRS SIRS physiology now resolved - likely due to dehydration as well as EtOH withdrawal - no evidence of acute infection - no evidence of signif bleeding this admission   Transaminitis - ? Alcoholic hepatitis Viral hepatitis panel non-reactive - Maddrey's discriminant function calculated at 32 9/10 - holding steroid for now - improving w/ supportive care - pattern of elevation most c/w EtOH induced injury   Recent Labs  Lab 03/23/21 1750 03/24/21 0430 03/25/21 0445 03/26/21 0307  AST 235* 187* 129* 85*  ALT 63* 47* 44 39  ALKPHOS 356* 277* 254* 217*  BILITOT 15.4* 12.4* 11.9* 9.6*  PROT 7.5 5.9* 5.7* 5.2*  ALBUMIN 2.4* 1.8* 1.7* 1.6*     Anemia of acute blood loss v/s chronic anemia of alcoholism Hgb fell, but w/ no evidence of ongoing bleeding - suspect this is dilutional  - stable now   Recent Labs  Lab 03/24/21 0430 03/24/21 1152 03/25/21 0445 03/25/21 1155 03/26/21 0307  HGB 7.7* 8.4* 7.1* 8.6* 10.0*     Alcoholism Denied consistent alcohol use but at presentation blood alcohol level was 205 - clear clinical stigmata of severe alcohol abuse - has been counseled on the absolute need to fully and permanently abstain from alcohol   Generalized weakness - recurrent falls  Likely due to EtOH - cont to encourage ambulation   Prolonged QTc -(517m) Likely related to multiple electrolyte abnormalities - corrected w/ correction of electrolyte abnormalities   Hypokalemia Due to alcoholism and malnutrition -likely reflective of significant total body deficit - corrected w/ supplementation   Hypoglycemia Suspect this is reflective of significant liver dysfunction - corrected w/ dextrose in IVF - stable now off dextrose IVF   Hyponatremia Correcting with volume expansion - likely due to alcoholism/liver disease  Hypomagnesemia Corrected with IV supplementation   Protein calorie malnutrition Due to alcoholism  Disposition Anticipate d/c home 9/11   Family Communication: No family present at time of exam Status is: Inpatient  Remains inpatient appropriate because:Inpatient level of care appropriate due to severity of illness  Dispo: The patient is from: Home              Anticipated d/c is to: Home              Patient currently is not medically stable to d/c.   Difficult to place patient No   Objective: Blood pressure (Marland Kitchen  89/55, pulse (!) 107, temperature 98.4 F (36.9 C), temperature source Oral, resp. rate (!) 27, height 4' 9"  (1.448 m), weight 71.5 kg, SpO2 97 %.  Intake/Output Summary (Last 24 hours) at 03/26/2021 0912 Last data filed at 03/26/2021 0526 Gross per 24 hour  Intake 1719.75 ml  Output 600 ml  Net 1119.75 ml    Filed Weights   03/23/21 1638 03/24/21 1621 03/25/21 0405  Weight: 64.4 kg 70.3 kg 71.5 kg     Examination: General: No acute respiratory distress Lungs: Clear to auscultation bilaterally  Cardiovascular: RRR - no M or rub  Abdomen: Distended, soft, significant hernia near her ostomy but remains non tender to touch, ostomy itself appears unremarkable, bowel sounds positive - stable abdom exam Extremities: No signif C/C/E B LE   CBC: Recent Labs  Lab 03/23/21 1750 03/24/21 0430 03/25/21 0445 03/25/21 1155 03/26/21 0307  WBC 7.5   < > 6.0 8.2 4.5  NEUTROABS 6.0  --   --   --   --   HGB 9.5*   < > 7.1* 8.6* 10.0*  HCT 27.8*   < > 21.1* 26.5* 31.2*  MCV 97.2   < > 99.5 103.1* 103.3*  PLT 183   < > 119* 131* 76*   < > = values in this interval not displayed.    Basic Metabolic Panel: Recent Labs  Lab 03/24/21 0430 03/25/21 0445 03/26/21 0307  NA 138 132* 131*  K 3.4* 4.4 3.8  CL 106 105 109  CO2 19* 22 19*  GLUCOSE 35* 220* 126*  BUN <5* <5* <5*  CREATININE <0.30* <0.30* 0.30*  CALCIUM 8.1* 8.1* 7.8*  MG 1.4* 2.3 2.0  PHOS 2.8  --  0.9*    GFR: Estimated Creatinine Clearance: 71.1 mL/min (A) (by C-G formula based on SCr of 0.3 mg/dL (L)).  Liver Function Tests: Recent Labs  Lab 03/23/21 1750 03/24/21 0430 03/25/21 0445 03/26/21 0307  AST 235* 187* 129* 85*  ALT 63* 47* 44 39  ALKPHOS 356* 277* 254* 217*  BILITOT 15.4* 12.4* 11.9* 9.6*  PROT 7.5 5.9* 5.7* 5.2*  ALBUMIN 2.4* 1.8* 1.7* 1.6*    Recent Labs  Lab 03/23/21 1750  LIPASE 32     Coagulation Profile: Recent Labs  Lab 03/23/21 1750 03/24/21 0430 03/25/21 0445  INR 1.2 1.3* 1.3*     HbA1C: Hgb A1c MFr Bld  Date/Time Value Ref Range Status  03/25/2021 04:45 AM 4.5 (L) 4.8 - 5.6 % Final    Comment:    (NOTE) Pre diabetes:          5.7%-6.4%  Diabetes:              >6.4%  Glycemic control for   <7.0% adults with diabetes   12/24/2019 10:08 AM 5.7 (H) 4.8 - 5.6 % Final    Comment:    (NOTE) Pre diabetes:          5.7%-6.4% Diabetes:              >6.4% Glycemic  control for   <7.0% adults with diabetes     CBG: Recent Labs  Lab 03/25/21 0739 03/25/21 1044 03/25/21 1633 03/25/21 2051 03/26/21 0711  GLUCAP 178* 163* 165* 180* 114*     Recent Results (from the past 240 hour(s))  Resp Panel by RT-PCR (Flu A&B, Covid) Nasopharyngeal Swab     Status: None   Collection Time: 03/23/21  8:00 PM   Specimen: Nasopharyngeal Swab; Nasopharyngeal(NP) swabs in vial transport  medium  Result Value Ref Range Status   SARS Coronavirus 2 by RT PCR NEGATIVE NEGATIVE Final    Comment: (NOTE) SARS-CoV-2 target nucleic acids are NOT DETECTED.  The SARS-CoV-2 RNA is generally detectable in upper respiratory specimens during the acute phase of infection. The lowest concentration of SARS-CoV-2 viral copies this assay can detect is 138 copies/mL. A negative result does not preclude SARS-Cov-2 infection and should not be used as the sole basis for treatment or other patient management decisions. A negative result may occur with  improper specimen collection/handling, submission of specimen other than nasopharyngeal swab, presence of viral mutation(s) within the areas targeted by this assay, and inadequate number of viral copies(<138 copies/mL). A negative result must be combined with clinical observations, patient history, and epidemiological information. The expected result is Negative.  Fact Sheet for Patients:  EntrepreneurPulse.com.au  Fact Sheet for Healthcare Providers:  IncredibleEmployment.be  This test is no t yet approved or cleared by the [place] FDA and  has been authorized for detection and/or diagnosis of SARS-CoV-2 by FDA under an Emergency Use Authorization (EUA). This EUA will remain  in effect (meaning this test can be used) for the duration of the COVID-19 declaration under Section 564(b)(1) of the Act, 21 U.S.C.section 360bbb-3(b)(1), unless the authorization is terminated  or revoked sooner.        Influenza A by PCR NEGATIVE NEGATIVE Final   Influenza B by PCR NEGATIVE NEGATIVE Final    Comment: (NOTE) The Xpert Xpress SARS-CoV-2/FLU/RSV plus assay is intended as an aid in the diagnosis of influenza from Nasopharyngeal swab specimens and should not be used as a sole basis for treatment. Nasal washings and aspirates are unacceptable for Xpert Xpress SARS-CoV-2/FLU/RSV testing.  Fact Sheet for Patients: EntrepreneurPulse.com.au  Fact Sheet for Healthcare Providers: IncredibleEmployment.be  This test is not yet approved or cleared by the [place] FDA and has been authorized for detection and/or diagnosis of SARS-CoV-2 by FDA under an Emergency Use Authorization (EUA). This EUA will remain in effect (meaning this test can be used) for the duration of the COVID-19 declaration under Section 564(b)(1) of the Act, 21 U.S.C. section 360bbb-3(b)(1), unless the authorization is terminated or revoked.  Performed at [facility], [address] [place], [place] [address]   MRSA Next Gen by PCR, Nasal     Status: None   Collection Time: 03/24/21  4:20 PM   Specimen: Nasal Mucosa; Nasal Swab  Result Value Ref Range Status   MRSA by PCR Next Gen NOT DETECTED NOT DETECTED Final    Comment: (NOTE) The GeneXpert MRSA Assay (FDA approved for NASAL specimens only), is one component of a comprehensive MRSA colonization surveillance program. It is not intended to diagnose MRSA infection nor to guide or monitor treatment for MRSA infections. Test performance is not FDA approved in patients less than 48 years old. Performed at [facility], [address] [place], [place] [address]       Scheduled Meds:  Chlorhexidine Gluconate Cloth  6 each Topical Daily   famotidine  20 mg Oral BID   folic acid  1 mg Oral Daily   multivitamin with minerals  1 tablet Oral Daily   phosphorus  250 mg Oral Daily   thiamine injection  100 mg Intravenous  Daily     LOS: 3 days   Cherene Altes, MD [facility] Office  [phone] Pager - Text Page per Shea Evans  If 7PM-7AM, please contact night-coverage per Amion 03/26/2021, 9:12 AM

## 2021-03-27 DIAGNOSIS — K7010 Alcoholic hepatitis without ascites: Principal | ICD-10-CM

## 2021-03-27 LAB — COMPREHENSIVE METABOLIC PANEL
ALT: 36 U/L (ref 0–44)
AST: 71 U/L — ABNORMAL HIGH (ref 15–41)
Albumin: 1.5 g/dL — ABNORMAL LOW (ref 3.5–5.0)
Alkaline Phosphatase: 215 U/L — ABNORMAL HIGH (ref 38–126)
Anion gap: 5 (ref 5–15)
BUN: <5 mg/dL — ABNORMAL LOW (ref 6–20)
CO2: 21 mmol/L — ABNORMAL LOW (ref 22–32)
Calcium: 7.7 mg/dL — ABNORMAL LOW (ref 8.9–10.3)
Chloride: 106 mmol/L (ref 98–111)
Creatinine, Ser: 0.3 mg/dL — ABNORMAL LOW (ref 0.44–1.00)
GFR, Estimated: >60 mL/min (ref >60)
Glucose, Bld: 106 mg/dL — ABNORMAL HIGH (ref 70–99)
Potassium: 3.6 mmol/L (ref 3.5–5.1)
Sodium: 132 mmol/L — ABNORMAL LOW (ref 135–145)
Total Bilirubin: 8.4 mg/dL — ABNORMAL HIGH (ref 0.3–1.2)
Total Protein: 5.3 g/dL — ABNORMAL LOW (ref 6.5–8.1)

## 2021-03-27 LAB — CBC
HCT: 21.4 % — ABNORMAL LOW (ref 36.0–46.0)
Hemoglobin: 6.8 g/dL — CL (ref 12.0–15.0)
MCH: 33 pg (ref 26.0–34.0)
MCHC: 31.8 g/dL (ref 30.0–36.0)
MCV: 103.9 fL — ABNORMAL HIGH (ref 80.0–100.0)
Platelets: 121 10*3/uL — ABNORMAL LOW (ref 150–400)
RBC: 2.06 MIL/uL — ABNORMAL LOW (ref 3.87–5.11)
RDW: 16.7 % — ABNORMAL HIGH (ref 11.5–15.5)
WBC: 6 10*3/uL (ref 4.0–10.5)
nRBC: 0.3 % — ABNORMAL HIGH (ref 0.0–0.2)

## 2021-03-27 LAB — HEMOGLOBIN AND HEMATOCRIT, BLOOD
HCT: 21.2 % — ABNORMAL LOW (ref 36.0–46.0)
HCT: 28.9 % — ABNORMAL LOW (ref 36.0–46.0)
Hemoglobin: 6.6 g/dL — CL (ref 12.0–15.0)
Hemoglobin: 9.1 g/dL — ABNORMAL LOW (ref 12.0–15.0)

## 2021-03-27 LAB — PHOSPHORUS: Phosphorus: 1.1 mg/dL — ABNORMAL LOW (ref 2.5–4.6)

## 2021-03-27 LAB — TSH: TSH: 3.872 u[IU]/mL (ref 0.350–4.500)

## 2021-03-27 MED ORDER — SODIUM CHLORIDE 0.9 % IV BOLUS
500.0000 mL | Freq: Once | INTRAVENOUS | Status: AC
  Administered 2021-03-27: 500 mL via INTRAVENOUS

## 2021-03-27 MED ORDER — SODIUM CHLORIDE 0.9% IV SOLUTION: Freq: Once | INTRAVENOUS | Status: DC

## 2021-03-27 MED ORDER — POTASSIUM PHOSPHATES 15 MMOLE/5ML IV SOLN
45.0000 mmol | Freq: Once | INTRAVENOUS | Status: AC
  Administered 2021-03-27: 45 mmol via INTRAVENOUS
  Filled 2021-03-27: qty 15

## 2021-03-27 NOTE — Progress Notes (Signed)
Critical hemoglobin of 6.6 called. Used Amion to message Dr. Sabino Gasser of result.

## 2021-03-27 NOTE — Progress Notes (Signed)
Subjective: Patient without complaints for me today.  Hemoglobin did drop to 6.4 and she is currently receiving PRBCs.  Patient is adamant that she has had no blood or black stools out of her ostomy.  Tolerating diet without any problems.  She is happy her appetite is back.  Objective: Vital signs in last 24 hours: Temp:  [98.3 F (36.8 C)-98.7 F (37.1 C)] 98.3 F (36.8 C) (09/11 1100) Pulse Rate:  [85-115] 87 (09/11 1100) Resp:  [14-33] 29 (09/11 1100) BP: (79-106)/(42-77) 92/77 (09/11 1100) SpO2:  [92 %-100 %] 95 % (09/11 1100) Last BM Date: 03/26/21 General:   Alert and oriented, pleasant Head:  Normocephalic and atraumatic. Eyes: Scleral icterus.  Abdomen:  Bowel sounds present, soft, non-tender, non-distended. No HSM or hernias noted. No rebound or guarding. No masses appreciated  Msk:  Symmetrical without gross deformities. Normal posture. Extremities:  Without clubbing or edema. Neurologic:  Alert and  oriented x4;  grossly normal neurologically. Skin:  Warm and dry, intact without significant lesions.  Cervical Nodes:  No significant cervical adenopathy. Psych:  Alert and cooperative. Normal mood and affect.  Intake/Output from previous day: 09/10 0701 - 09/11 0700 In: 2381.8 [P.O.:1080; I.V.:881.1; IV Piggyback:420.7] Out: 150 [Stool:150] Intake/Output this shift: Total I/O In: 692.7 [P.O.:240; Blood:320; IV Piggyback:132.7] Out: -   Lab Results: Recent Labs    03/25/21 1155 03/26/21 0307 03/27/21 0345 03/27/21 0638  WBC 8.2 4.5 6.0  --   HGB 8.6* 10.0* 6.8* 6.6*  HCT 26.5* 31.2* 21.4* 21.2*  PLT 131* 76* 121*  --     BMET Recent Labs    03/25/21 0445 03/26/21 0307 03/27/21 0345  NA 132* 131* 132*  K 4.4 3.8 3.6  CL 105 109 106  CO2 22 19* 21*  GLUCOSE 220* 126* 106*  BUN <5* <5* <5*  CREATININE <0.30* 0.30* 0.30*  CALCIUM 8.1* 7.8* 7.7*    LFT Recent Labs    03/24/21 1245 03/25/21 0445 03/26/21 0307 03/27/21 0345  PROT  --  5.7* 5.2*  5.3*  ALBUMIN  --  1.7* 1.6* 1.5*  AST  --  129* 85* 71*  ALT  --  44 39 36  ALKPHOS  --  254* 217* 215*  BILITOT  --  11.9* 9.6* 8.4*  BILIDIR 8.8*  --   --   --     PT/INR Recent Labs    03/25/21 0445 03/26/21 1910  LABPROT 16.4* 16.5*  INR 1.3* 1.3*    Hepatitis Panel No results for input(s): HEPBSAG, HCVAB, HEPAIGM, HEPBIGM in the last 72 hours.    Studies/Results: No results found.  Assessment: *Acute alcoholic hepatitis *Abnormal LFTs due to above *Nausea-improved *Weight loss  Plan: Patient's bilirubin improved today, INR stable, Madrey's discriminant function 29. We will check LFTs and coags in the a.m.  Continue to hold off on steroids for now.  Once again counseled patient on the vast importance of strict alcohol cessation and she understands.  Tolerating soft diet today.  States she eats the majority of her breakfast.  States she feels the best she has in a long time in regards to her appetite.  Otherwise continue supportive care.  Drop in hemoglobin noted.  Patient is adamant that she has had no evidence of bleeding in her ostomy.  We will continue to monitor for now and transfuse as needed.  Case discussed with Dr. Alex Gardener K. Abbey Chatters, D.O. Gastroenterology and Hepatology [facility]   LOS: 4 days  03/27/2021, 12:17 PM

## 2021-03-27 NOTE — Progress Notes (Addendum)
RN called due to patient's BP at 82/53 with a MAP of 59, IV NS bolus 529ms was given without improvement, a second bolus of 500 mls started, TSH pending.  She was asymptomatic.

## 2021-03-27 NOTE — Progress Notes (Signed)
Progress Note    Alyssa Carey   JGO:[id number]  DOB: 1971/01/08  DOA: 03/23/2021     4  PCP: [facility]  Initial CC: weakness, poor appetite, weight loss  Hospital Course: 50 year old with a history of HTN, necrotizing fasciitis requiring colostomy, and alcohol abuse who presented to the ER with a 1 month history of weakness with frequent falls, poor appetite, and a 20 pound weight loss over 6 weeks.  She related intermittent pain around the site of her colostomy and endorsed blood around her stoma prior to admission but no overt bleeding or concern thereof. In the ED she was tachypneic and tachycardic.  She was found to be hypokalemic, hyponatremic, and hypoglycemic with elevated LFTs and an alcohol level of 205.  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  Interval History:  Hemoglobin was low this morning and 1 unit of blood ordered.  She also endorses feeling weak and tired still.  Denies any dizziness.  ROS: Constitutional: positive for fatigue and malaise, Respiratory: negative for cough and sputum, Cardiovascular: negative for chest pain, and Gastrointestinal: negative for abdominal pain  Assessment & Plan:  SIRS SIRS physiology now resolved - likely due to dehydration as well as EtOH withdrawal - no evidence of acute infection - no evidence of bleeding this admission    Alcoholic hepatitis Viral hepatitis panel non-reactive - Maddrey's discriminant function calculated at 32 9/10 - holding steroid for now - improving w/ supportive care - pattern of elevation most c/w EtOH induced injury  - continue trending LFTs  Macrocytic Anemia  -Folate low/normal, 9.7 - B12 1445 - Ferritin 1343, iron 104, sat ratio 90% - Suspect hemoglobin likely low from chronic alcohol use and liver injury - Hgb 6.8 g/dL this am; transfuse 1 unit PRBC; no reported bleeding but BP  noted to be soft and given bolus overnight as well; follow up BP response to blood and repeat Hgb  Chronic alcohol use - Denied consistent alcohol use but at presentation blood alcohol level was 205 - clear clinical stigmata of severe alcohol abuse - has been counseled on the absolute need to fully and permanently abstain from alcohol    Generalized weakness - recurrent falls  Likely due to EtOH - cont to encourage ambulation  - continue PT   Prolonged QTc -(523m) Likely related to multiple electrolyte abnormalities - corrected w/ correction of electrolyte abnormalities    Hypokalemia Due to alcoholism and malnutrition -likely reflective of significant total body deficit - corrected w/ supplementation    Hypoglycemia Suspect this is reflective of significant liver dysfunction - corrected w/ dextrose in IVF - stable now off dextrose IVF    Hyponatremia Correcting with volume expansion - likely due to alcoholism/liver disease   Hypomagnesemia Corrected with IV supplementation    Protein calorie malnutrition Due to alcoholism - Patient's BMI is Body mass index is 34.11 kg/m.. - Patient has the following signs/symptoms consistent with PCM: (fat loss, muscle loss).   Old records reviewed in assessment of this patient  Antimicrobials:   DVT prophylaxis: Place and maintain sequential compression device Start: 03/24/21 0755 SCDs Start: 03/23/21 2254   Code Status:   Code Status: Full Code Family Communication:   Disposition Plan: Status is: Inpatient  Remains inpatient appropriate because:IV treatments appropriate due to intensity of illness or inability to take PO and Inpatient level of care appropriate due to severity of illness  Dispo: The patient is from: Home              Anticipated d/c is to: Home              Patient currently is not medically stable to d/c.   Difficult to place patient No      Risk of unplanned readmission score: Unplanned Admission- Pilot do  not use: 25.75   Objective: Blood pressure 93/63, pulse 94, temperature 98.3 F (36.8 C), temperature source Oral, resp. rate (!) 26, height 4' 9"  (1.448 m), weight 71.5 kg, SpO2 97 %.  Examination: General appearance: alert, cooperative, and no distress Head: Normocephalic, without obvious abnormality, atraumatic Eyes:  EOMI Lungs: clear to auscultation bilaterally Heart: regular rate and rhythm and S1, S2 normal Abdomen:  Ostomy bag in place with brown stool noted and no evidence of bleeding.  Soft, nontender, nondistended, bowel sounds present Extremities:  No edema Skin: mobility and turgor normal Neurologic: Grossly normal  Consultants:  GI  Procedures:    Data Reviewed: I have personally reviewed following labs and imaging studies Results for orders placed or performed during the hospital encounter of 03/23/21 (from the past 24 hour(s))  Glucose, capillary     Status: Abnormal   Collection Time: 03/26/21 11:14 AM  Result Value Ref Range   Glucose-Capillary 123 (H) 70 - 99 mg/dL  Glucose, capillary     Status: Abnormal   Collection Time: 03/26/21  4:16 PM  Result Value Ref Range   Glucose-Capillary 145 (H) 70 - 99 mg/dL  Protime-INR     Status: Abnormal   Collection Time: 03/26/21  7:10 PM  Result Value Ref Range   Prothrombin Time 16.5 (H) 11.4 - 15.2 seconds   INR 1.3 (H) 0.8 - 1.2  Phosphorus     Status: Abnormal   Collection Time: 03/27/21  3:45 AM  Result Value Ref Range   Phosphorus 1.1 (L) 2.5 - 4.6 mg/dL  Comprehensive metabolic panel     Status: Abnormal   Collection Time: 03/27/21  3:45 AM  Result Value Ref Range   Sodium 132 (L) 135 - 145 mmol/L   Potassium 3.6 3.5 - 5.1 mmol/L   Chloride 106 98 - 111 mmol/L   CO2 21 (L) 22 - 32 mmol/L   Glucose, Bld 106 (H) 70 - 99 mg/dL   BUN <5 (L) 6 - 20 mg/dL   Creatinine, Ser 0.30 (L) 0.44 - 1.00 mg/dL   Calcium 7.7 (L) 8.9 - 10.3 mg/dL   Total Protein 5.3 (L) 6.5 - 8.1 g/dL   Albumin 1.5 (L) 3.5 - 5.0 g/dL    AST 71 (H) 15 - 41 U/L   ALT 36 0 - 44 U/L   Alkaline Phosphatase 215 (H) 38 - 126 U/L   Total Bilirubin 8.4 (H) 0.3 - 1.2 mg/dL   GFR, Estimated >60 >60 mL/min   Anion gap 5 5 - 15  CBC     Status: Abnormal   Collection Time: 03/27/21  3:45 AM  Result Value Ref Range   WBC 6.0 4.0 - 10.5 K/uL   RBC 2.06 (L) 3.87 - 5.11 MIL/uL   Hemoglobin 6.8 (LL) 12.0 - 15.0 g/dL   HCT 21.4 (L) 36.0 - 46.0 %   MCV 103.9 (H) 80.0 - 100.0 fL   MCH 33.0 26.0 - 34.0 pg   MCHC 31.8 30.0 - 36.0 g/dL   RDW 16.7 (H) 11.5 - 15.5 %   Platelets 121 (L) 150 - 400 K/uL  nRBC 0.3 (H) 0.0 - 0.2 %  TSH     Status: None   Collection Time: 03/27/21  6:38 AM  Result Value Ref Range   TSH 3.872 0.350 - 4.500 uIU/mL  Hemoglobin and hematocrit, blood     Status: Abnormal   Collection Time: 03/27/21  6:38 AM  Result Value Ref Range   Hemoglobin 6.6 (LL) 12.0 - 15.0 g/dL   HCT 21.2 (L) 36.0 - 46.0 %  Prepare RBC (crossmatch)     Status: None   Collection Time: 03/27/21  8:05 AM  Result Value Ref Range   Order Confirmation      ORDER PROCESSED BY BLOOD BANK Performed at [facility], [address] [place], [place] [address]     Recent Results (from the past 240 hour(s))  Resp Panel by RT-PCR (Flu A&B, Covid) Nasopharyngeal Swab     Status: None   Collection Time: 03/23/21  8:00 PM   Specimen: Nasopharyngeal Swab; Nasopharyngeal(NP) swabs in vial transport medium  Result Value Ref Range Status   SARS Coronavirus 2 by RT PCR NEGATIVE NEGATIVE Final    Comment: (NOTE) SARS-CoV-2 target nucleic acids are NOT DETECTED.  The SARS-CoV-2 RNA is generally detectable in upper respiratory specimens during the acute phase of infection. The lowest concentration of SARS-CoV-2 viral copies this assay can detect is 138 copies/mL. A negative result does not preclude SARS-Cov-2 infection and should not be used as the sole basis for treatment or other patient management decisions. A negative result may occur with   improper specimen collection/handling, submission of specimen other than nasopharyngeal swab, presence of viral mutation(s) within the areas targeted by this assay, and inadequate number of viral copies(<138 copies/mL). A negative result must be combined with clinical observations, patient history, and epidemiological information. The expected result is Negative.  Fact Sheet for Patients:  EntrepreneurPulse.com.au  Fact Sheet for Healthcare Providers:  IncredibleEmployment.be  This test is no t yet approved or cleared by the [place] FDA and  has been authorized for detection and/or diagnosis of SARS-CoV-2 by FDA under an Emergency Use Authorization (EUA). This EUA will remain  in effect (meaning this test can be used) for the duration of the COVID-19 declaration under Section 564(b)(1) of the Act, 21 U.S.C.section 360bbb-3(b)(1), unless the authorization is terminated  or revoked sooner.       Influenza A by PCR NEGATIVE NEGATIVE Final   Influenza B by PCR NEGATIVE NEGATIVE Final    Comment: (NOTE) The Xpert Xpress SARS-CoV-2/FLU/RSV plus assay is intended as an aid in the diagnosis of influenza from Nasopharyngeal swab specimens and should not be used as a sole basis for treatment. Nasal washings and aspirates are unacceptable for Xpert Xpress SARS-CoV-2/FLU/RSV testing.  Fact Sheet for Patients: EntrepreneurPulse.com.au  Fact Sheet for Healthcare Providers: IncredibleEmployment.be  This test is not yet approved or cleared by the [place] FDA and has been authorized for detection and/or diagnosis of SARS-CoV-2 by FDA under an Emergency Use Authorization (EUA). This EUA will remain in effect (meaning this test can be used) for the duration of the COVID-19 declaration under Section 564(b)(1) of the Act, 21 U.S.C. section 360bbb-3(b)(1), unless the authorization is terminated  or revoked.  Performed at [facility], [address] [place], [place] [address]   MRSA Next Gen by PCR, Nasal     Status: None   Collection Time: 03/24/21  4:20 PM   Specimen: Nasal Mucosa; Nasal Swab  Result Value Ref Range Status   MRSA by  PCR Next Gen NOT DETECTED NOT DETECTED Final    Comment: (NOTE) The GeneXpert MRSA Assay (FDA approved for NASAL specimens only), is one component of a comprehensive MRSA colonization surveillance program. It is not intended to diagnose MRSA infection nor to guide or monitor treatment for MRSA infections. Test performance is not FDA approved in patients less than 47 years old. Performed at [facility], [address], [place],  [address]      Radiology Studies: No results found. CT Abdomen Pelvis W Contrast  Final Result      Scheduled Meds:  sodium chloride   Intravenous Once   Chlorhexidine Gluconate Cloth  6 each Topical Daily   famotidine  20 mg Oral BID   folic acid  1 mg Oral Daily   multivitamin with minerals  1 tablet Oral Daily   thiamine injection  100 mg Intravenous Daily   PRN Meds: acetaminophen, hydrOXYzine, loperamide, prochlorperazine, traZODone Continuous Infusions:  potassium PHOSPHATE IVPB (in mmol)       LOS: 4 days  Time spent: Greater than 50% of the 35 minute visit was spent in counseling/coordination of care for the patient as laid out in the A&P.   Dwyane Dee, MD [facility] 03/27/2021, 10:45 AM

## 2021-03-28 LAB — COMPREHENSIVE METABOLIC PANEL
ALT: 33 U/L (ref 0–44)
AST: 59 U/L — ABNORMAL HIGH (ref 15–41)
Albumin: 1.5 g/dL — ABNORMAL LOW (ref 3.5–5.0)
Alkaline Phosphatase: 194 U/L — ABNORMAL HIGH (ref 38–126)
Anion gap: 6 (ref 5–15)
BUN: <5 mg/dL — ABNORMAL LOW (ref 6–20)
CO2: 22 mmol/L (ref 22–32)
Calcium: 8.1 mg/dL — ABNORMAL LOW (ref 8.9–10.3)
Chloride: 105 mmol/L (ref 98–111)
Creatinine, Ser: 0.31 mg/dL — ABNORMAL LOW (ref 0.44–1.00)
GFR, Estimated: >60 mL/min (ref >60)
Glucose, Bld: 113 mg/dL — ABNORMAL HIGH (ref 70–99)
Potassium: 3.7 mmol/L (ref 3.5–5.1)
Sodium: 133 mmol/L — ABNORMAL LOW (ref 135–145)
Total Bilirubin: 8.3 mg/dL — ABNORMAL HIGH (ref 0.3–1.2)
Total Protein: 4.9 g/dL — ABNORMAL LOW (ref 6.5–8.1)

## 2021-03-28 LAB — CBC WITH DIFFERENTIAL/PLATELET
Abs Immature Granulocytes: 0.09 10*3/uL — ABNORMAL HIGH (ref 0.00–0.07)
Basophils Absolute: 0.1 10*3/uL (ref 0.0–0.1)
Basophils Relative: 1 %
Eosinophils Absolute: 0.1 10*3/uL (ref 0.0–0.5)
Eosinophils Relative: 2 %
HCT: 25.2 % — ABNORMAL LOW (ref 36.0–46.0)
Hemoglobin: 8.2 g/dL — ABNORMAL LOW (ref 12.0–15.0)
Immature Granulocytes: 2 %
Lymphocytes Relative: 28 %
Lymphs Abs: 1.5 10*3/uL (ref 0.7–4.0)
MCH: 32.9 pg (ref 26.0–34.0)
MCHC: 32.5 g/dL (ref 30.0–36.0)
MCV: 101.2 fL — ABNORMAL HIGH (ref 80.0–100.0)
Monocytes Absolute: 0.6 10*3/uL (ref 0.1–1.0)
Monocytes Relative: 12 %
Neutro Abs: 3 10*3/uL (ref 1.7–7.7)
Neutrophils Relative %: 55 %
Platelets: 117 10*3/uL — ABNORMAL LOW (ref 150–400)
RBC: 2.49 MIL/uL — ABNORMAL LOW (ref 3.87–5.11)
RDW: 17.3 % — ABNORMAL HIGH (ref 11.5–15.5)
WBC: 5.4 10*3/uL (ref 4.0–10.5)
nRBC: 0 % (ref 0.0–0.2)

## 2021-03-28 LAB — BPAM RBC
Blood Product Expiration Date: 202210052359
ISSUE DATE / TIME: 202209110828
Unit Type and Rh: 6200

## 2021-03-28 LAB — TYPE AND SCREEN
ABO/RH(D): A POS
Antibody Screen: NEGATIVE
Unit division: 0

## 2021-03-28 LAB — MAGNESIUM: Magnesium: 1.5 mg/dL — ABNORMAL LOW (ref 1.7–2.4)

## 2021-03-28 LAB — GLUCOSE, CAPILLARY: Glucose-Capillary: 91 mg/dL (ref 70–99)

## 2021-03-28 MED ORDER — MAGNESIUM SULFATE 2 GM/50ML IV SOLN: 2.0000 g | Freq: Once | INTRAVENOUS | Status: DC

## 2021-03-28 MED ORDER — MAGNESIUM SULFATE 4 GM/100ML IV SOLN
4.0000 g | Freq: Once | INTRAVENOUS | Status: AC
  Administered 2021-03-28: 4 g via INTRAVENOUS
  Filled 2021-03-28: qty 100

## 2021-03-28 NOTE — Plan of Care (Signed)

## 2021-03-28 NOTE — Progress Notes (Signed)
Subjective: Patient without complaints for me today.  Hemoglobin improved after receiving 1 unit PRBCs yesterday.  Patient continues to state that she has had no blood or melena out of her ostomy.  States her appetite is good.  Ate most of her breakfast.  Objective: Vital signs in last 24 hours: Temp:  [97.7 F (36.5 C)-98.1 F (36.7 C)] 97.7 F (36.5 C) (09/12 1100) Pulse Rate:  [70-102] 95 (09/12 0800) Resp:  [17-36] 23 (09/12 0800) BP: (80-101)/(51-71) 92/67 (09/12 0800) SpO2:  [92 %-100 %] 100 % (09/12 0800) Last BM Date: 03/27/21 General:   Alert and oriented, pleasant Head:  Normocephalic and atraumatic. Eyes: Scleral icterus.  Abdomen:  Bowel sounds present, soft, non-tender, non-distended. No HSM or hernias noted. No rebound or guarding. No masses appreciated  Msk:  Symmetrical without gross deformities. Normal posture. Extremities:  Without clubbing or edema. Neurologic:  Alert and  oriented x4;  grossly normal neurologically. Skin:  Warm and dry, intact without significant lesions.  Cervical Nodes:  No significant cervical adenopathy. Psych:  Alert and cooperative. Normal mood and affect.  Intake/Output from previous day: 09/11 0701 - 09/12 0700 In: 1672.6 [P.O.:720; Blood:320; IV Piggyback:632.6] Out: 735 [Urine:450; Stool:285] Intake/Output this shift: No intake/output data recorded.  Lab Results: Recent Labs    03/26/21 0307 03/27/21 0345 03/27/21 0638 03/27/21 1326 03/28/21 0500  WBC 4.5 6.0  --   --  5.4  HGB 10.0* 6.8* 6.6* 9.1* 8.2*  HCT 31.2* 21.4* 21.2* 28.9* 25.2*  PLT 76* 121*  --   --  117*    BMET Recent Labs    03/26/21 0307 03/27/21 0345 03/28/21 0500  NA 131* 132* 133*  K 3.8 3.6 3.7  CL 109 106 105  CO2 19* 21* 22  GLUCOSE 126* 106* 113*  BUN <5* <5* <5*  CREATININE 0.30* 0.30* 0.31*  CALCIUM 7.8* 7.7* 8.1*    LFT Recent Labs    03/26/21 0307 03/27/21 0345 03/28/21 0500  PROT 5.2* 5.3* 4.9*  ALBUMIN 1.6* 1.5* 1.5*  AST  85* 71* 59*  ALT 39 36 33  ALKPHOS 217* 215* 194*  BILITOT 9.6* 8.4* 8.3*    PT/INR Recent Labs    03/26/21 1910  LABPROT 16.5*  INR 1.3*    Hepatitis Panel No results for input(s): HEPBSAG, HCVAB, HEPAIGM, HEPBIGM in the last 72 hours.    Studies/Results: No results found.  Assessment: *Acute alcoholic hepatitis *Abnormal LFTs due to above *Nausea-improved *Weight loss  Plan: Patient's bilirubin improved today, no INR collected today.  Madrey's discriminant function 29 yesterday.  Continue to monitor LFTs and coags.  Continue to hold off on steroids for now.  Once again counseled patient on the vast importance of strict alcohol cessation and she understands.  Tolerating diet without any nausea.  Counseled on the vast importance of nutrition and protein.  States she feels the best she has in a long time in regards to her appetite.  Otherwise continue supportive care.  Hemoglobin stable.  No evidence of GI bleeding.  We will continue to monitor.  GI to sign off, please call with any questions or concerns.  We will arrange outpatient follow-up.  Will need repeat CBC, LFTs and coags in 1 to 2 weeks  Juanda Crumble K. Abbey Chatters, D.O. Gastroenterology and Hepatology [facility]   LOS: 5 days    03/28/2021, 12:41 PM

## 2021-03-28 NOTE — Progress Notes (Addendum)
Physical Therapy Treatment Patient Details Name: Alyssa Carey MRN: [id number] DOB: 01/09/1971 Today's Date: 03/28/2021   History of Present Illness Alyssa Carey is a 50 y.o. female who presents due to 1 month history of weakness and frequent falls, she complained of decreased appetite with a loss of more than 20 pounds within the last 6 weeks.  She complained of intermittent pain around the colostomy.  She complained of nausea but no vomiting. She denies fever, chills, chest pain, shortness of breath or IV drug use. PMH: HTN, necrotizing fasciitis requiring colostomy, alcohol dependence    PT Comments    Pt is mobilizing in room at modified independent, not using DME, no unsteadiness, denies dizziness, HA, SOB, or pain complaints. Pt reports mobilizing with nursing when desired daily. Educated pt on seated exercises and pt performs with good form.  Pt is safe to ambulate and mobilize with nursing for duration of stay, no acute PT needs identified and will sign off at this time.   Recommendations for follow up therapy are one component of a multi-disciplinary discharge planning process, led by the attending physician.  Recommendations may be updated based on patient status, additional functional criteria and insurance authorization.  Follow Up Recommendations  No PT follow up;Supervision - Intermittent     Equipment Recommendations  None recommended by PT    Recommendations for Other Services       Precautions / Restrictions Precautions Precautions: None Restrictions Weight Bearing Restrictions: No     Mobility  Bed Mobility Overal bed mobility: Modified Independent   Transfers Overall transfer level: Modified independent Equipment used: None Transfers: Sit to/from [employer] Transfers Sit to Stand: Modified independent (Device/Increase time) Stand pivot transfers: Modified independent (Device/Increase time)     Ambulation/Gait Ambulation/Gait assistance:  Modified independent (Device/Increase time) Gait Distance (Feet): 30 Feet Assistive device: None Gait Pattern/deviations: WFL(Within Functional Limits) Gait velocity: slightly decreased   General Gait Details: pt ambulates in room without DME, no LOB or unsteadiness noted, HR up to 140s with ambulation but pt denies symptoms, improves to 90s with seated rest   Stairs             Wheelchair Mobility    Modified Rankin (Stroke Patients Only)       Balance Overall balance assessment: No apparent balance deficits (not formally assessed)        Cognition Arousal/Alertness: Awake/alert Behavior During Therapy: WFL for tasks assessed/performed Overall Cognitive Status: Within Functional Limits for tasks assessed              Exercises General Exercises - Lower Extremity Long Arc Quad: Seated;AROM;Strengthening;Both;20 reps Hip Flexion/Marching: Seated;AROM;Strengthening;Both;20 reps Other Exercises Other Exercises: STS 5 reps, light UE use    General Comments General comments (skin integrity, edema, etc.): HR up to 140s with ambulation in room      Pertinent Vitals/Pain Pain Assessment: No/denies pain    Home Living                      Prior Function            PT Goals (current goals can now be found in the care plan section) Acute Rehab PT Goals PT Goal Formulation: All assessment and education complete, DC therapy Progress towards PT goals: Goals met/education completed, patient discharged from PT    Frequency    Min 2X/week      PT Plan Current plan remains appropriate    Co-evaluation  AM-PAC PT "6 Clicks" Mobility   Outcome Measure  Help needed turning from your back to your side while in a flat bed without using bedrails?: None Help needed moving from lying on your back to sitting on the side of a flat bed without using bedrails?: None Help needed moving to and from a bed to a chair (including a wheelchair)?:  None Help needed standing up from a chair using your arms (e.g., wheelchair or bedside chair)?: None Help needed to walk in hospital room?: None Help needed climbing 3-5 steps with a railing? : A Little 6 Click Score: 23    End of Session Equipment Utilized During Treatment: Gait belt Activity Tolerance: Patient tolerated treatment well Patient left: in chair;with call bell/phone within reach Nurse Communication: Mobility status PT Visit Diagnosis: Unsteadiness on feet (R26.81);Other abnormalities of gait and mobility (R26.89);Muscle weakness (generalized) (M62.81)     Time: 8381-8403 PT Time Calculation (min) (ACUTE ONLY): 18 min  Charges:  $Therapeutic Exercise: 8-22 mins                      Tori Suzzette Gasparro PT, DPT 03/28/21, 10:36 AM

## 2021-03-28 NOTE — Progress Notes (Signed)
Progress Note    Alyssa Carey   UMP:[id number]  DOB: [birth year]  DOA: 03/23/2021     5  PCP: [facility]  Initial CC: weakness, poor appetite, weight loss  Hospital Course: 50 year old with a history of HTN, necrotizing fasciitis requiring colostomy, and alcohol abuse who presented to the ER with a 1 month history of weakness with frequent falls, poor appetite, and 20 pound weight loss over 6 weeks.  She related intermittent pain around the site of her colostomy and endorsed blood around her stoma prior to admission but no overt bleeding or concern thereof. In the ED she was tachypneic and tachycardic.  She was found to be hypokalemic, hyponatremic, and hypoglycemic with elevated LFTs and an alcohol level of 205.  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  Interval History:  Patient was seen and examined.  She was very motivated when she worked with physical therapy.  Her heart rate went up but she did not feel dizzy.  We discussed about discharge planning.  She thinks she needs more days in the hospital. Denies any nausea vomiting or abdominal pain. She will get out of the ICU, mobilize more and get ready to go home tomorrow.  ROS: Constitutional: positive for fatigue and malaise, Respiratory: negative for cough and sputum, Cardiovascular: negative for chest pain, and Gastrointestinal: negative for abdominal pain  Assessment & Plan:  SIRS SIRS physiology now resolved - likely due to dehydration as well as EtOH withdrawal - no evidence of acute infection - no evidence of bleeding this admission.    Alcoholic hepatitis Viral hepatitis panel non-reactive - Maddrey's discriminant function calculated at 29 on 9/11.  Since patient has normal coagulation patterns and clinically stabilizing, GI recommended to hold off on treating with the steroids.  continue trending  LFTs  Macrocytic Anemia  -Folate low/normal, 9.7 - B12 1445 - Ferritin 1343, iron 104, sat ratio 90% - Suspect hemoglobin likely low from chronic alcohol use and liver injury - Hgb 6.8 g/dL -1 unit of PRBC given with appropriate response.  Recheck levels tomorrow morning.  No evidence of bleeding.    Chronic alcohol use - Denied consistent alcohol use but at presentation blood alcohol level was 205. - has been counseled on the absolute need to fully and permanently abstain from alcohol    Generalized weakness - recurrent falls  Likely due to EtOH - cont to encourage ambulation  - continue PT. mobility has improved.  Continue to work with PT OT and nursing staff at the bedside to prepare to go home.    Hypokalemia Due to alcoholism and malnutrition -likely reflective of significant total body deficit - corrected w/ supplementation .  Replace aggressively.  Recheck tomorrow morning.   Hyponatremia Correcting with volume expansion - likely due to alcoholism/liver disease   Hypomagnesemia Corrected with IV supplementation .  Replace further today.  Recheck tomorrow morning.   Protein calorie malnutrition Due to alcoholism - Patient's BMI is Body mass index is 34.11 kg/m.. - Patient has the following signs/symptoms consistent with PCM: (fat loss, muscle loss).    Antimicrobials: None.   DVT prophylaxis: Place and maintain sequential compression device Start: 03/24/21 0755 SCDs Start: 03/23/21 2254   Code Status:   Code Status: Full Code Family Communication:   Disposition Plan: Status is: Inpatient  Remains inpatient appropriate because:IV treatments appropriate due to intensity of illness or inability to take PO  and Inpatient level of care appropriate due to severity of illness  Dispo: The patient is from: Home              Anticipated d/c is to: Home              Patient currently is not medically stable to d/c.   Difficult to place patient No  Discontinue telemetry.   Transfer to MedSurg bed.  Walk multiple times today in preparation to go home tomorrow.     Objective: Blood pressure 99/67, pulse 86, temperature 97.7 F (36.5 C), temperature source Oral, resp. rate (!) 21, height 4' 9"  (1.448 m), weight 71.5 kg, SpO2 100 %.  Examination: General appearance: alert, cooperative, and no distress icteric. Head: Normocephalic, without obvious abnormality, atraumatic Eyes:  EOMI Lungs: clear to auscultation bilaterally Heart: regular rate and rhythm and S1, S2 normal Abdomen:  Ostomy bag in place with brown stool noted and no evidence of bleeding.  Soft, nontender, nondistended, bowel sounds present Extremities:  No edema Skin: mobility and turgor normal Neurologic: Grossly normal  Consultants:  GI  Procedures:  None.  Data Reviewed: I have personally reviewed following labs and imaging studies Results for orders placed or performed during the hospital encounter of 03/23/21 (from the past 24 hour(s))  CBC with Differential/Platelet     Status: Abnormal   Collection Time: 03/28/21  5:00 AM  Result Value Ref Range   WBC 5.4 4.0 - 10.5 K/uL   RBC 2.49 (L) 3.87 - 5.11 MIL/uL   Hemoglobin 8.2 (L) 12.0 - 15.0 g/dL   HCT 25.2 (L) 36.0 - 46.0 %   MCV 101.2 (H) 80.0 - 100.0 fL   MCH 32.9 26.0 - 34.0 pg   MCHC 32.5 30.0 - 36.0 g/dL   RDW 17.3 (H) 11.5 - 15.5 %   Platelets 117 (L) 150 - 400 K/uL   nRBC 0.0 0.0 - 0.2 %   Neutrophils Relative % 55 %   Neutro Abs 3.0 1.7 - 7.7 K/uL   Lymphocytes Relative 28 %   Lymphs Abs 1.5 0.7 - 4.0 K/uL   Monocytes Relative 12 %   Monocytes Absolute 0.6 0.1 - 1.0 K/uL   Eosinophils Relative 2 %   Eosinophils Absolute 0.1 0.0 - 0.5 K/uL   Basophils Relative 1 %   Basophils Absolute 0.1 0.0 - 0.1 K/uL   Immature Granulocytes 2 %   Abs Immature Granulocytes 0.09 (H) 0.00 - 0.07 K/uL  Comprehensive metabolic panel     Status: Abnormal   Collection Time: 03/28/21  5:00 AM  Result Value Ref Range   Sodium 133 (L)  135 - 145 mmol/L   Potassium 3.7 3.5 - 5.1 mmol/L   Chloride 105 98 - 111 mmol/L   CO2 22 22 - 32 mmol/L   Glucose, Bld 113 (H) 70 - 99 mg/dL   BUN <5 (L) 6 - 20 mg/dL   Creatinine, Ser 0.31 (L) 0.44 - 1.00 mg/dL   Calcium 8.1 (L) 8.9 - 10.3 mg/dL   Total Protein 4.9 (L) 6.5 - 8.1 g/dL   Albumin 1.5 (L) 3.5 - 5.0 g/dL   AST 59 (H) 15 - 41 U/L   ALT 33 0 - 44 U/L   Alkaline Phosphatase 194 (H) 38 - 126 U/L   Total Bilirubin 8.3 (H) 0.3 - 1.2 mg/dL   GFR, Estimated >60 >60 mL/min   Anion gap 6 5 - 15  Magnesium     Status: Abnormal   Collection  Time: 03/28/21  5:00 AM  Result Value Ref Range   Magnesium 1.5 (L) 1.7 - 2.4 mg/dL  Glucose, capillary     Status: None   Collection Time: 03/28/21 11:33 AM  Result Value Ref Range   Glucose-Capillary 91 70 - 99 mg/dL    Recent Results (from the past 240 hour(s))  Resp Panel by RT-PCR (Flu A&B, Covid) Nasopharyngeal Swab     Status: None   Collection Time: 03/23/21  8:00 PM   Specimen: Nasopharyngeal Swab; Nasopharyngeal(NP) swabs in vial transport medium  Result Value Ref Range Status   SARS Coronavirus 2 by RT PCR NEGATIVE NEGATIVE Final    Comment: (NOTE) SARS-CoV-2 target nucleic acids are NOT DETECTED.  The SARS-CoV-2 RNA is generally detectable in upper respiratory specimens during the acute phase of infection. The lowest concentration of SARS-CoV-2 viral copies this assay can detect is 138 copies/mL. A negative result does not preclude SARS-Cov-2 infection and should not be used as the sole basis for treatment or other patient management decisions. A negative result may occur with  improper specimen collection/handling, submission of specimen other than nasopharyngeal swab, presence of viral mutation(s) within the areas targeted by this assay, and inadequate number of viral copies(<138 copies/mL). A negative result must be combined with clinical observations, patient history, and epidemiological information. The expected  result is Negative.  Fact Sheet for Patients:  EntrepreneurPulse.com.au  Fact Sheet for Healthcare Providers:  IncredibleEmployment.be  This test is no t yet approved or cleared by the [place] FDA and  has been authorized for detection and/or diagnosis of SARS-CoV-2 by FDA under an Emergency Use Authorization (EUA). This EUA will remain  in effect (meaning this test can be used) for the duration of the COVID-19 declaration under Section 564(b)(1) of the Act, 21 U.S.C.section 360bbb-3(b)(1), unless the authorization is terminated  or revoked sooner.       Influenza A by PCR NEGATIVE NEGATIVE Final   Influenza B by PCR NEGATIVE NEGATIVE Final    Comment: (NOTE) The Xpert Xpress SARS-CoV-2/FLU/RSV plus assay is intended as an aid in the diagnosis of influenza from Nasopharyngeal swab specimens and should not be used as a sole basis for treatment. Nasal washings and aspirates are unacceptable for Xpert Xpress SARS-CoV-2/FLU/RSV testing.  Fact Sheet for Patients: EntrepreneurPulse.com.au  Fact Sheet for Healthcare Providers: IncredibleEmployment.be  This test is not yet approved or cleared by the [place] FDA and has been authorized for detection and/or diagnosis of SARS-CoV-2 by FDA under an Emergency Use Authorization (EUA). This EUA will remain in effect (meaning this test can be used) for the duration of the COVID-19 declaration under Section 564(b)(1) of the Act, 21 U.S.C. section 360bbb-3(b)(1), unless the authorization is terminated or revoked.  Performed at [facility], [address], [place], [place] [address]   MRSA Next Gen by PCR, Nasal     Status: None   Collection Time: 03/24/21  4:20 PM   Specimen: Nasal Mucosa; Nasal Swab  Result Value Ref Range Status   MRSA by PCR Next Gen NOT DETECTED NOT DETECTED Final    Comment: (NOTE) The GeneXpert MRSA Assay (FDA approved for NASAL  specimens only), is one component of a comprehensive MRSA colonization surveillance program. It is not intended to diagnose MRSA infection nor to guide or monitor treatment for MRSA infections. Test performance is not FDA approved in patients less than 86 years old. Performed at [facility], [address], [place], [place] [address]  Radiology Studies: No results found. CT Abdomen Pelvis W Contrast  Final Result      Scheduled Meds:  sodium chloride   Intravenous Once   Chlorhexidine Gluconate Cloth  6 each Topical Daily   famotidine  20 mg Oral BID   folic acid  1 mg Oral Daily   multivitamin with minerals  1 tablet Oral Daily   thiamine injection  100 mg Intravenous Daily   PRN Meds: acetaminophen, hydrOXYzine, loperamide, prochlorperazine, traZODone Continuous Infusions:     LOS: 5 days  Time spent: 30 minutes  Barb Merino, MD [facility] 03/28/2021, 1:53 PM

## 2021-03-29 LAB — CBC WITH DIFFERENTIAL/PLATELET
Abs Immature Granulocytes: 0.07 10*3/uL (ref 0.00–0.07)
Basophils Absolute: 0 10*3/uL (ref 0.0–0.1)
Basophils Relative: 0 %
Eosinophils Absolute: 0.1 10*3/uL (ref 0.0–0.5)
Eosinophils Relative: 1 %
HCT: 24.1 % — ABNORMAL LOW (ref 36.0–46.0)
Hemoglobin: 7.9 g/dL — ABNORMAL LOW (ref 12.0–15.0)
Immature Granulocytes: 1 %
Lymphocytes Relative: 27 %
Lymphs Abs: 1.5 10*3/uL (ref 0.7–4.0)
MCH: 33.2 pg (ref 26.0–34.0)
MCHC: 32.8 g/dL (ref 30.0–36.0)
MCV: 101.3 fL — ABNORMAL HIGH (ref 80.0–100.0)
Monocytes Absolute: 0.8 10*3/uL (ref 0.1–1.0)
Monocytes Relative: 14 %
Neutro Abs: 3.1 10*3/uL (ref 1.7–7.7)
Neutrophils Relative %: 57 %
Platelets: 120 10*3/uL — ABNORMAL LOW (ref 150–400)
RBC: 2.38 MIL/uL — ABNORMAL LOW (ref 3.87–5.11)
RDW: 17.8 % — ABNORMAL HIGH (ref 11.5–15.5)
WBC: 5.5 10*3/uL (ref 4.0–10.5)
nRBC: 0 % (ref 0.0–0.2)

## 2021-03-29 LAB — COMPREHENSIVE METABOLIC PANEL
ALT: 28 U/L (ref 0–44)
AST: 55 U/L — ABNORMAL HIGH (ref 15–41)
Albumin: 1.4 g/dL — ABNORMAL LOW (ref 3.5–5.0)
Alkaline Phosphatase: 174 U/L — ABNORMAL HIGH (ref 38–126)
Anion gap: 6 (ref 5–15)
BUN: <5 mg/dL — ABNORMAL LOW (ref 6–20)
CO2: 21 mmol/L — ABNORMAL LOW (ref 22–32)
Calcium: 7.2 mg/dL — ABNORMAL LOW (ref 8.9–10.3)
Chloride: 103 mmol/L (ref 98–111)
Creatinine, Ser: 0.35 mg/dL — ABNORMAL LOW (ref 0.44–1.00)
GFR, Estimated: >60 mL/min (ref >60)
Glucose, Bld: 93 mg/dL (ref 70–99)
Potassium: 3.3 mmol/L — ABNORMAL LOW (ref 3.5–5.1)
Sodium: 130 mmol/L — ABNORMAL LOW (ref 135–145)
Total Bilirubin: 8 mg/dL — ABNORMAL HIGH (ref 0.3–1.2)
Total Protein: 4.9 g/dL — ABNORMAL LOW (ref 6.5–8.1)

## 2021-03-29 LAB — MAGNESIUM: Magnesium: 2 mg/dL (ref 1.7–2.4)

## 2021-03-29 LAB — PROTIME-INR
INR: 1.3 — ABNORMAL HIGH (ref 0.8–1.2)
Prothrombin Time: 16.5 seconds — ABNORMAL HIGH (ref 11.4–15.2)

## 2021-03-29 LAB — PHOSPHORUS: Phosphorus: 3 mg/dL (ref 2.5–4.6)

## 2021-03-29 MED ORDER — POTASSIUM CHLORIDE CRYS ER 20 MEQ PO TBCR: 20.0000 meq | EXTENDED_RELEASE_TABLET | Freq: Every day | ORAL | 0 refills | Status: DC

## 2021-03-29 MED ORDER — ADULT MULTIVITAMIN W/MINERALS CH: 1.0000 | ORAL_TABLET | Freq: Every day | ORAL | 0 refills | Status: AC

## 2021-03-29 MED ORDER — POTASSIUM CHLORIDE CRYS ER 20 MEQ PO TBCR
40.0000 meq | EXTENDED_RELEASE_TABLET | Freq: Once | ORAL | Status: AC
  Administered 2021-03-29: 40 meq via ORAL
  Filled 2021-03-29: qty 2

## 2021-03-29 MED ORDER — FAMOTIDINE 20 MG PO TABS: 20.0000 mg | ORAL_TABLET | Freq: Two times a day (BID) | ORAL | 0 refills | Status: DC

## 2021-03-29 NOTE — Progress Notes (Signed)
Discussed d/c instructions with patient, pt ambulatory and giving herself bath, no distress noted, IV removed, pressure dressing applied. Pt belongings with pt. Son at bedside , pt escorted out on wheelchair.

## 2021-03-29 NOTE — Discharge Summary (Signed)
Physician Discharge Summary  Alyssa Carey:[id number] DOB: 29-May-1971 DOA: 03/23/2021  PCP: [facility]  Admit date: 03/23/2021 Discharge date: 03/29/2021  Admitted From: Home Disposition: Home  Recommendations for Outpatient Follow-up:  Follow up with PCP in 1-2 weeks Please obtain CMP/magnesium/phosphorus/CBC in one week GI will schedule follow-up.  Home Health: Not applicable Equipment/Devices: Not applicable  Discharge Condition: Stable CODE STATUS: Full code Diet recommendation: Regular low-salt diet.  Complete abstinence from alcohol.  Discharge summary: 50 year old with a history of HTN, necrotizing fasciitis requiring colostomy, and alcohol abuse who presented to the ER with a 1 month history of weakness with frequent falls, poor appetite, and 20 pound weight loss over 6 weeks.  She related intermittent pain around the site of her colostomy and endorsed blood around her stoma prior to admission but no overt bleeding or concern thereof. In the ED she was tachypneic and tachycardic.  She was found to be hypokalemic, hyponatremic, and hypoglycemic with elevated LFTs and an alcohol level of 205.  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  Patient was admitted to hospital with acute alcoholic hepatitis for symptomatic treatment.  Acute alcoholic hepatitis/SIRS present on admission: Mostly improved.  Viral hepatitis panel nonreactive. She had elevated Madrey discriminant function score of 29, however with medical stabilization and normal coagulation decided to treat her symptomatically.  Patient is stabilized now.  Seen and followed by gastroenterology. Received 1 unit of PRBC for hemoglobin of 6.8 and has remained stable since then. LFTs has improved and stabilized.  Plan: Complete abstinence from alcohol.  Pepcid 20 mg twice daily.  Multivitamins. Aggressive  replacement of electrolytes and normalized.  We will continue potassium for next few days. Patient was encouraged to keep up follow-up. Repeat liver function test and hemoglobin in 1 week.    Discharge Diagnoses:  Principal Problem:   Alcoholic hepatitis without ascites Active Problems:   Fall at home, initial encounter   Weakness   Alcoholism ([place])   HTN (hypertension)   Hypokalemia   Hypoalbuminemia due to protein-calorie malnutrition (HCC)   Hypoglycemia   Obesity (BMI 30.0-34.9)   Serum total bilirubin elevated   Anemia   Prolonged QT interval    Discharge Instructions  Discharge Instructions     Call MD for:  difficulty breathing, headache or visual disturbances   Complete by: As directed    Call MD for:  persistant dizziness or light-headedness   Complete by: As directed    Call MD for:  persistant nausea and vomiting   Complete by: As directed    Diet general   Complete by: As directed    Discharge instructions   Complete by: As directed    Do not drink alcohol .   Increase activity slowly   Complete by: As directed       Allergies as of 03/29/2021       Reactions   Pantoprazole    Stomach pain   Penicillins Hives   Did it involve swelling of the face/tongue/throat, SOB, or low BP? N Did it involve sudden or severe rash/hives, skin peeling, or any reaction on the inside of your mouth or nose? Y Did you need to seek medical attention at a hospital or doctor's office? N When did it last happen?  Childhood     If all above answers are "NO", may proceed with cephalosporin use.   Oxycodone Hcl Rash  Medication List     TAKE these medications    BOOST PO Take 1 Bottle by mouth 4 (four) times daily.   cholecalciferol 25 MCG (1000 UNIT) tablet Commonly known as: VITAMIN D3 Take 1,000 Units by mouth daily.   famotidine 20 MG tablet Commonly known as: PEPCID Take 1 tablet (20 mg total) by mouth 2 (two) times daily.   multivitamin with  minerals Tabs tablet Take 1 tablet by mouth daily.   ondansetron 4 MG tablet Commonly known as: Zofran Take 1 tablet (4 mg total) by mouth every 8 (eight) hours as needed for nausea or vomiting.   potassium chloride SA 20 MEQ tablet Commonly known as: KLOR-CON Take 1 tablet (20 mEq total) by mouth daily for 3 days.   vitamin C 500 MG tablet Commonly known as: ASCORBIC ACID Take 500 mg by mouth daily.        Follow-up Information     [facility] Follow up in 1 week(s).   Why: check CBC and CMP Contact information: [place] [address] [phone]                Allergies  Allergen Reactions   Pantoprazole     Stomach pain   Penicillins Hives    Did it involve swelling of the face/tongue/throat, SOB, or low BP? N Did it involve sudden or severe rash/hives, skin peeling, or any reaction on the inside of your mouth or nose? Y Did you need to seek medical attention at a hospital or doctor's office? N When did it last happen?  Childhood     If all above answers are "NO", may proceed with cephalosporin use.    Oxycodone Hcl Rash    Consultations: Gastroenterology   Procedures/Studies: CT Abdomen Pelvis W Contrast  Result Date: 03/23/2021 CLINICAL DATA:  Acute hepatitis. EXAM: CT ABDOMEN AND PELVIS WITH CONTRAST TECHNIQUE: Multidetector CT imaging of the abdomen and pelvis was performed using the standard protocol following bolus administration of intravenous contrast. CONTRAST:  92m OMNIPAQUE IOHEXOL 350 MG/ML SOLN COMPARISON:  CT abdomen and pelvis 01/27/2021. FINDINGS: Lower chest: No acute abnormality. Hepatobiliary: The liver is markedly enlarged, similar to the prior study. There is marked diffuse fatty infiltration of the liver, also unchanged. No focal liver lesions are identified. Gallstones are present. There is no biliary ductal dilatation. Pancreas: Unremarkable. No pancreatic ductal dilatation or surrounding inflammatory changes.  Spleen: Normal in size without focal abnormality. Adrenals/Urinary Tract: The kidneys and adrenal glands are within normal limits. The bladder is decompressed. No bladder calculi are seen. Stomach/Bowel: Stomach is within normal limits. Appendix is not seen. No evidence of bowel wall thickening, distention, or inflammatory changes. Left-sided ileostomy is again noted with large peristomal hernia. Vascular/Lymphatic: No significant vascular findings are present. No enlarged abdominal or pelvic lymph nodes. Reproductive: Uterus and bilateral adnexa are unremarkable. Other: Again seen is a large left-sided parastomal hernia containing nondilated colon and small bowel. Wall defect measures 6.0 by 4.2 cm similar to the prior examination. This is similar to prior. There is some mild diffuse mesenteric edema. There is no ascites or free air. There is also small fat containing umbilical hernia. Musculoskeletal: No acute or significant osseous findings. IMPRESSION: 1. Again seen is a left lower quadrant ileostomy with large parastomal hernia containing nondilated bowel loops. There is no evidence for bowel obstruction. There is mild nonspecific mesenteric edema diffusely. 2. Hepatomegaly and hepatic steatosis changes are similar to the prior study. 3. Cholelithiasis. Electronically  Signed   By: Ronney Asters M.D.   On: 03/23/2021 21:51   (Echo, Carotid, EGD, Colonoscopy, ERCP)    Subjective: Patient seen and examined.  She had some discomfort on her colostomy and used 1 dose of Tylenol overnight.  Denies any complaints.  She walked multiple times yesterday.  Denies any nausea or vomiting.   Discharge Exam: Vitals:   03/28/21 1930 03/28/21 2000  BP:  96/70  Pulse:  88  Resp:    Temp: 98.2 F (36.8 C)   SpO2:     Vitals:   03/28/21 1100 03/28/21 1200 03/28/21 1930 03/28/21 2000  BP: 96/73 99/67  96/70  Pulse: 96 86  88  Resp: 15 (!) 21    Temp: 97.7 F (36.5 C)  98.2 F (36.8 C)   TempSrc: Oral  Oral    SpO2: 100% 100%    Weight:      Height:        General: Pt is alert, awake, not in acute distress Looks comfortable.  Eating breakfast.  Icteric. Cardiovascular: RRR, S1/S2 +, no rubs, no gallops Respiratory: CTA bilaterally, no wheezing, no rhonchi Abdominal: Soft, NT, ND, bowel sounds + Patient has left lower quadrant colostomy with brown stool.  She has some parastomal hernia which is nontender. Extremities: no edema, no cyanosis    The results of significant diagnostics from this hospitalization (including imaging, microbiology, ancillary and laboratory) are listed below for reference.     Microbiology: Recent Results (from the past 240 hour(s))  Resp Panel by RT-PCR (Flu A&B, Covid) Nasopharyngeal Swab     Status: None   Collection Time: 03/23/21  8:00 PM   Specimen: Nasopharyngeal Swab; Nasopharyngeal(NP) swabs in vial transport medium  Result Value Ref Range Status   SARS Coronavirus 2 by RT PCR NEGATIVE NEGATIVE Final    Comment: (NOTE) SARS-CoV-2 target nucleic acids are NOT DETECTED.  The SARS-CoV-2 RNA is generally detectable in upper respiratory specimens during the acute phase of infection. The lowest concentration of SARS-CoV-2 viral copies this assay can detect is 138 copies/mL. A negative result does not preclude SARS-Cov-2 infection and should not be used as the sole basis for treatment or other patient management decisions. A negative result may occur with  improper specimen collection/handling, submission of specimen other than nasopharyngeal swab, presence of viral mutation(s) within the areas targeted by this assay, and inadequate number of viral copies(<138 copies/mL). A negative result must be combined with clinical observations, patient history, and epidemiological information. The expected result is Negative.  Fact Sheet for Patients:  EntrepreneurPulse.com.au  Fact Sheet for Healthcare Providers:   IncredibleEmployment.be  This test is no t yet approved or cleared by the [place] FDA and  has been authorized for detection and/or diagnosis of SARS-CoV-2 by FDA under an Emergency Use Authorization (EUA). This EUA will remain  in effect (meaning this test can be used) for the duration of the COVID-19 declaration under Section 564(b)(1) of the Act, 21 U.S.C.section 360bbb-3(b)(1), unless the authorization is terminated  or revoked sooner.       Influenza A by PCR NEGATIVE NEGATIVE Final   Influenza B by PCR NEGATIVE NEGATIVE Final    Comment: (NOTE) The Xpert Xpress SARS-CoV-2/FLU/RSV plus assay is intended as an aid in the diagnosis of influenza from Nasopharyngeal swab specimens and should not be used as a sole basis for treatment. Nasal washings and aspirates are unacceptable for Xpert Xpress SARS-CoV-2/FLU/RSV testing.  Fact Sheet for Patients: EntrepreneurPulse.com.au  Fact Sheet for  Healthcare Providers: IncredibleEmployment.be  This test is not yet approved or cleared by the [place] and has been authorized for detection and/or diagnosis of SARS-CoV-2 by FDA under an Emergency Use Authorization (EUA). This EUA will remain in effect (meaning this test can be used) for the duration of the COVID-19 declaration under Section 564(b)(1) of the Act, 21 U.S.C. section 360bbb-3(b)(1), unless the authorization is terminated or revoked.  Performed at [facility], [address], [place], [place] [address]   MRSA Next Gen by PCR, Nasal     Status: None   Collection Time: 03/24/21  4:20 PM   Specimen: Nasal Mucosa; Nasal Swab  Result Value Ref Range Status   MRSA by PCR Next Gen NOT DETECTED NOT DETECTED Final    Comment: (NOTE) The GeneXpert MRSA Assay (FDA approved for NASAL specimens only), is one component of a comprehensive MRSA colonization surveillance program. It is not intended to diagnose MRSA  infection nor to guide or monitor treatment for MRSA infections. Test performance is not FDA approved in patients less than 87 years old. Performed at [facility], [address], [place], [place] [address]      Labs: BNP (last 3 results) No results for input(s): BNP in the last 8760 hours. Basic Metabolic Panel: Recent Labs  Lab 03/24/21 0430 03/25/21 0445 03/26/21 0307 03/27/21 0345 03/28/21 0500 03/29/21 0506  NA 138 132* 131* 132* 133* 130*  K 3.4* 4.4 3.8 3.6 3.7 3.3*  CL 106 105 109 106 105 103  CO2 19* 22 19* 21* 22 21*  GLUCOSE 35* 220* 126* 106* 113* 93  BUN <5* <5* <5* <5* <5* <5*  CREATININE <0.30* <0.30* 0.30* 0.30* 0.31* 0.35*  CALCIUM 8.1* 8.1* 7.8* 7.7* 8.1* 7.2*  MG 1.4* 2.3 2.0  --  1.5* 2.0  PHOS 2.8  --  0.9* 1.1*  --  3.0   Liver Function Tests: Recent Labs  Lab 03/25/21 0445 03/26/21 0307 03/27/21 0345 03/28/21 0500 03/29/21 0506  AST 129* 85* 71* 59* 55*  ALT 44 39 36 33 28  ALKPHOS 254* 217* 215* 194* 174*  BILITOT 11.9* 9.6* 8.4* 8.3* 8.0*  PROT 5.7* 5.2* 5.3* 4.9* 4.9*  ALBUMIN 1.7* 1.6* 1.5* 1.5* 1.4*   Recent Labs  Lab 03/23/21 1750  LIPASE 32   No results for input(s): AMMONIA in the last 168 hours. CBC: Recent Labs  Lab 03/23/21 1750 03/24/21 0430 03/25/21 1155 03/26/21 0307 03/27/21 0345 03/27/21 0638 03/27/21 1326 03/28/21 0500 03/29/21 0506  WBC 7.5   < > 8.2 4.5 6.0  --   --  5.4 5.5  NEUTROABS 6.0  --   --   --   --   --   --  3.0 3.1  HGB 9.5*   < > 8.6* 10.0* 6.8* 6.6* 9.1* 8.2* 7.9*  HCT 27.8*   < > 26.5* 31.2* 21.4* 21.2* 28.9* 25.2* 24.1*  MCV 97.2   < > 103.1* 103.3* 103.9*  --   --  101.2* 101.3*  PLT 183   < > 131* 76* 121*  --   --  117* 120*   < > = values in this interval not displayed.   Cardiac Enzymes: No results for input(s): CKTOTAL, CKMB, CKMBINDEX, TROPONINI in the last 168 hours. BNP: Invalid input(s): POCBNP CBG: Recent Labs  Lab 03/25/21 2051 03/26/21 0711 03/26/21 1114  03/26/21 1616 03/28/21 1133  GLUCAP 180* 114* 123* 145* 91   D-Dimer No results for input(s): DDIMER in the last  72 hours. Hgb A1c No results for input(s): HGBA1C in the last 72 hours. Lipid Profile No results for input(s): CHOL, HDL, LDLCALC, TRIG, CHOLHDL, LDLDIRECT in the last 72 hours. Thyroid function studies Recent Labs    03/27/21 0638  TSH 3.872   Anemia work up No results for input(s): VITAMINB12, FOLATE, FERRITIN, TIBC, IRON, RETICCTPCT in the last 72 hours. Urinalysis    Component Value Date/Time   COLORURINE BROWN (A) 03/23/2021 2000   APPEARANCEUR CLEAR 03/23/2021 2000   LABSPEC 1.025 03/23/2021 2000   PHURINE 6.5 03/23/2021 2000   GLUCOSEU 100 (A) 03/23/2021 2000   HGBUR NEGATIVE 03/23/2021 2000   BILIRUBINUR LARGE (A) 03/23/2021 2000   KETONESUR 15 (A) 03/23/2021 2000   PROTEINUR 30 (A) 03/23/2021 2000   NITRITE POSITIVE (A) 03/23/2021 2000   LEUKOCYTESUR TRACE (A) 03/23/2021 2000   Sepsis Labs Invalid input(s): PROCALCITONIN,  WBC,  LACTICIDVEN Microbiology Recent Results (from the past 240 hour(s))  Resp Panel by RT-PCR (Flu A&B, Covid) Nasopharyngeal Swab     Status: None   Collection Time: 03/23/21  8:00 PM   Specimen: Nasopharyngeal Swab; Nasopharyngeal(NP) swabs in vial transport medium  Result Value Ref Range Status   SARS Coronavirus 2 by RT PCR NEGATIVE NEGATIVE Final    Comment: (NOTE) SARS-CoV-2 target nucleic acids are NOT DETECTED.  The SARS-CoV-2 RNA is generally detectable in upper respiratory specimens during the acute phase of infection. The lowest concentration of SARS-CoV-2 viral copies this assay can detect is 138 copies/mL. A negative result does not preclude SARS-Cov-2 infection and should not be used as the sole basis for treatment or other patient management decisions. A negative result may occur with  improper specimen collection/handling, submission of specimen other than nasopharyngeal swab, presence of viral mutation(s)  within the areas targeted by this assay, and inadequate number of viral copies(<138 copies/mL). A negative result must be combined with clinical observations, patient history, and epidemiological information. The expected result is Negative.  Fact Sheet for Patients:  EntrepreneurPulse.com.au  Fact Sheet for Healthcare Providers:  IncredibleEmployment.be  This test is no t yet approved or cleared by the [place] FDA and  has been authorized for detection and/or diagnosis of SARS-CoV-2 by FDA under an Emergency Use Authorization (EUA). This EUA will remain  in effect (meaning this test can be used) for the duration of the COVID-19 declaration under Section 564(b)(1) of the Act, 21 U.S.C.section 360bbb-3(b)(1), unless the authorization is terminated  or revoked sooner.       Influenza A by PCR NEGATIVE NEGATIVE Final   Influenza B by PCR NEGATIVE NEGATIVE Final    Comment: (NOTE) The Xpert Xpress SARS-CoV-2/FLU/RSV plus assay is intended as an aid in the diagnosis of influenza from Nasopharyngeal swab specimens and should not be used as a sole basis for treatment. Nasal washings and aspirates are unacceptable for Xpert Xpress SARS-CoV-2/FLU/RSV testing.  Fact Sheet for Patients: EntrepreneurPulse.com.au  Fact Sheet for Healthcare Providers: IncredibleEmployment.be  This test is not yet approved or cleared by the [place] FDA and has been authorized for detection and/or diagnosis of SARS-CoV-2 by FDA under an Emergency Use Authorization (EUA). This EUA will remain in effect (meaning this test can be used) for the duration of the COVID-19 declaration under Section 564(b)(1) of the Act, 21 U.S.C. section 360bbb-3(b)(1), unless the authorization is terminated or revoked.  Performed at [facility], [address], [place], [place] [address]   MRSA Next Gen by PCR, Nasal     Status: None  Collection Time: 03/24/21  4:20 PM   Specimen: Nasal Mucosa; Nasal Swab  Result Value Ref Range Status   MRSA by PCR Next Gen NOT DETECTED NOT DETECTED Final    Comment: (NOTE) The GeneXpert MRSA Assay (FDA approved for NASAL specimens only), is one component of a comprehensive MRSA colonization surveillance program. It is not intended to diagnose MRSA infection nor to guide or monitor treatment for MRSA infections. Test performance is not FDA approved in patients less than 25 years old. Performed at [facility], [address] [place], [place] [address]      Time coordinating discharge:  32 minutes  SIGNED:   Barb Merino, MD  [facility] 03/29/2021, 8:25 AM

## 2021-04-09 ENCOUNTER — Emergency Department (HOSPITAL_COMMUNITY): Payer: Self-pay

## 2021-04-09 ENCOUNTER — Inpatient Hospital Stay (HOSPITAL_COMMUNITY)
Admission: EM | Admit: 2021-04-09 | Discharge: 2021-04-12 | DRG: 395 | Disposition: A | Discharge status: Home / Self Care | Payer: Self-pay | Attending: Internal Medicine | Admitting: Internal Medicine

## 2021-04-09 ENCOUNTER — Encounter (HOSPITAL_COMMUNITY): Payer: Self-pay | Admitting: Emergency Medicine

## 2021-04-09 ENCOUNTER — Other Ambulatory Visit: Payer: Self-pay

## 2021-04-09 DIAGNOSIS — K709 Alcoholic liver disease, unspecified: Secondary | ICD-10-CM | POA: Diagnosis present

## 2021-04-09 DIAGNOSIS — Z23 Encounter for immunization: Secondary | ICD-10-CM

## 2021-04-09 DIAGNOSIS — I10 Essential (primary) hypertension: Secondary | ICD-10-CM | POA: Diagnosis present

## 2021-04-09 DIAGNOSIS — K922 Gastrointestinal hemorrhage, unspecified: Secondary | ICD-10-CM

## 2021-04-09 DIAGNOSIS — D500 Iron deficiency anemia secondary to blood loss (chronic): Secondary | ICD-10-CM | POA: Diagnosis present

## 2021-04-09 DIAGNOSIS — E8809 Other disorders of plasma-protein metabolism, not elsewhere classified: Secondary | ICD-10-CM

## 2021-04-09 DIAGNOSIS — K760 Fatty (change of) liver, not elsewhere classified: Secondary | ICD-10-CM | POA: Diagnosis present

## 2021-04-09 DIAGNOSIS — K219 Gastro-esophageal reflux disease without esophagitis: Secondary | ICD-10-CM | POA: Diagnosis present

## 2021-04-09 DIAGNOSIS — K433 Parastomal hernia with obstruction, without gangrene: Secondary | ICD-10-CM | POA: Diagnosis present

## 2021-04-09 DIAGNOSIS — Z20822 Contact with and (suspected) exposure to covid-19: Secondary | ICD-10-CM | POA: Diagnosis present

## 2021-04-09 DIAGNOSIS — K9401 Colostomy hemorrhage: Principal | ICD-10-CM

## 2021-04-09 DIAGNOSIS — K7460 Unspecified cirrhosis of liver: Secondary | ICD-10-CM | POA: Diagnosis present

## 2021-04-09 DIAGNOSIS — K279 Peptic ulcer, site unspecified, unspecified as acute or chronic, without hemorrhage or perforation: Secondary | ICD-10-CM | POA: Diagnosis present

## 2021-04-09 DIAGNOSIS — D649 Anemia, unspecified: Secondary | ICD-10-CM

## 2021-04-09 DIAGNOSIS — K2920 Alcoholic gastritis without bleeding: Secondary | ICD-10-CM | POA: Diagnosis present

## 2021-04-09 DIAGNOSIS — F10129 Alcohol abuse with intoxication, unspecified: Secondary | ICD-10-CM | POA: Diagnosis present

## 2021-04-09 DIAGNOSIS — K435 Parastomal hernia without obstruction or  gangrene: Secondary | ICD-10-CM | POA: Diagnosis present

## 2021-04-09 DIAGNOSIS — F1010 Alcohol abuse, uncomplicated: Secondary | ICD-10-CM

## 2021-04-09 DIAGNOSIS — Y833 Surgical operation with formation of external stoma as the cause of abnormal reaction of the patient, or of later complication, without mention of misadventure at the time of the procedure: Secondary | ICD-10-CM | POA: Diagnosis present

## 2021-04-09 DIAGNOSIS — Z933 Colostomy status: Secondary | ICD-10-CM

## 2021-04-09 DIAGNOSIS — E876 Hypokalemia: Secondary | ICD-10-CM

## 2021-04-09 DIAGNOSIS — Y904 Blood alcohol level of 80-99 mg/100 ml: Secondary | ICD-10-CM | POA: Diagnosis present

## 2021-04-09 LAB — COMPREHENSIVE METABOLIC PANEL
ALT: 26 U/L (ref 0–44)
AST: 81 U/L — ABNORMAL HIGH (ref 15–41)
Albumin: 1.6 g/dL — ABNORMAL LOW (ref 3.5–5.0)
Alkaline Phosphatase: 147 U/L — ABNORMAL HIGH (ref 38–126)
Anion gap: 11 (ref 5–15)
BUN: 5 mg/dL — ABNORMAL LOW (ref 6–20)
CO2: 26 mmol/L (ref 22–32)
Calcium: 7.2 mg/dL — ABNORMAL LOW (ref 8.9–10.3)
Chloride: 97 mmol/L — ABNORMAL LOW (ref 98–111)
Creatinine, Ser: 0.42 mg/dL — ABNORMAL LOW (ref 0.44–1.00)
GFR, Estimated: >60 mL/min (ref >60)
Glucose, Bld: 83 mg/dL (ref 70–99)
Potassium: 2.3 mmol/L — CL (ref 3.5–5.1)
Sodium: 134 mmol/L — ABNORMAL LOW (ref 135–145)
Total Bilirubin: 6.2 mg/dL — ABNORMAL HIGH (ref 0.3–1.2)
Total Protein: 5.4 g/dL — ABNORMAL LOW (ref 6.5–8.1)

## 2021-04-09 LAB — CBC
HCT: 22.2 % — ABNORMAL LOW (ref 36.0–46.0)
HCT: 30.9 % — ABNORMAL LOW (ref 36.0–46.0)
Hemoglobin: 7.2 g/dL — ABNORMAL LOW (ref 12.0–15.0)
Hemoglobin: 10.3 g/dL — ABNORMAL LOW (ref 12.0–15.0)
MCH: 33.5 pg (ref 26.0–34.0)
MCH: 33 pg (ref 26.0–34.0)
MCHC: 32.4 g/dL (ref 30.0–36.0)
MCHC: 33.3 g/dL (ref 30.0–36.0)
MCV: 103.3 fL — ABNORMAL HIGH (ref 80.0–100.0)
MCV: 99 fL (ref 80.0–100.0)
Platelets: 201 10*3/uL (ref 150–400)
Platelets: 245 10*3/uL (ref 150–400)
RBC: 2.15 MIL/uL — ABNORMAL LOW (ref 3.87–5.11)
RBC: 3.12 MIL/uL — ABNORMAL LOW (ref 3.87–5.11)
RDW: 16.3 % — ABNORMAL HIGH (ref 11.5–15.5)
RDW: 17 % — ABNORMAL HIGH (ref 11.5–15.5)
WBC: 8.2 10*3/uL (ref 4.0–10.5)
WBC: 10.4 10*3/uL (ref 4.0–10.5)
nRBC: 0 % (ref 0.0–0.2)
nRBC: 0 % (ref 0.0–0.2)

## 2021-04-09 LAB — LIPASE, BLOOD: Lipase: 45 U/L (ref 11–51)

## 2021-04-09 LAB — ETHANOL: Alcohol, Ethyl (B): 84 mg/dL — ABNORMAL HIGH (ref <10)

## 2021-04-09 LAB — MAGNESIUM: Magnesium: 1.4 mg/dL — ABNORMAL LOW (ref 1.7–2.4)

## 2021-04-09 LAB — RESP PANEL BY RT-PCR (FLU A&B, COVID) ARPGX2
Influenza A by PCR: NEGATIVE
Influenza B by PCR: NEGATIVE
SARS Coronavirus 2 by RT PCR: NEGATIVE

## 2021-04-09 LAB — APTT: aPTT: 36 seconds (ref 24–36)

## 2021-04-09 LAB — PROTIME-INR
INR: 1.2 (ref 0.8–1.2)
Prothrombin Time: 15.2 seconds (ref 11.4–15.2)

## 2021-04-09 LAB — POC URINE PREG, ED: Preg Test, Ur: NEGATIVE

## 2021-04-09 MED ORDER — ENSURE ENLIVE PO LIQD
237.0000 mL | Freq: Four times a day (QID) | ORAL | Status: DC
  Administered 2021-04-10 – 2021-04-12 (×7): 237 mL via ORAL
  Filled 2021-04-09 (×8): qty 237

## 2021-04-09 MED ORDER — SODIUM CHLORIDE 0.9% FLUSH
3.0000 mL | Freq: Two times a day (BID) | INTRAVENOUS | Status: DC
  Administered 2021-04-09 – 2021-04-12 (×6): 3 mL via INTRAVENOUS

## 2021-04-09 MED ORDER — POLYETHYLENE GLYCOL 3350 17 G PO PACK: 17.0000 g | PACK | Freq: Every day | ORAL | Status: DC | PRN

## 2021-04-09 MED ORDER — POTASSIUM CHLORIDE 10 MEQ/100ML IV SOLN
10.0000 meq | Freq: Once | INTRAVENOUS | Status: AC
  Administered 2021-04-09: 10 meq via INTRAVENOUS
  Filled 2021-04-09: qty 100

## 2021-04-09 MED ORDER — SODIUM CHLORIDE 0.9 % IV SOLN
10.0000 mL/h | Freq: Once | INTRAVENOUS | Status: AC
  Administered 2021-04-09: 10 mL/h via INTRAVENOUS

## 2021-04-09 MED ORDER — FAMOTIDINE IN NACL 20-0.9 MG/50ML-% IV SOLN
20.0000 mg | Freq: Two times a day (BID) | INTRAVENOUS | Status: DC
  Administered 2021-04-09 – 2021-04-12 (×6): 20 mg via INTRAVENOUS
  Filled 2021-04-09 (×9): qty 50

## 2021-04-09 MED ORDER — ACETAMINOPHEN 325 MG PO TABS
650.0000 mg | ORAL_TABLET | Freq: Four times a day (QID) | ORAL | Status: DC | PRN
  Administered 2021-04-10 – 2021-04-12 (×3): 650 mg via ORAL
  Filled 2021-04-09 (×3): qty 2

## 2021-04-09 MED ORDER — THIAMINE HCL 100 MG/ML IJ SOLN
100.0000 mg | Freq: Every day | INTRAMUSCULAR | Status: DC
  Administered 2021-04-11: 100 mg via INTRAVENOUS
  Filled 2021-04-09: qty 2

## 2021-04-09 MED ORDER — POTASSIUM CHLORIDE 20 MEQ PO PACK
40.0000 meq | PACK | Freq: Once | ORAL | Status: AC
  Administered 2021-04-09: 40 meq via ORAL
  Filled 2021-04-09: qty 2

## 2021-04-09 MED ORDER — ADULT MULTIVITAMIN W/MINERALS CH
1.0000 | ORAL_TABLET | Freq: Every day | ORAL | Status: DC
  Administered 2021-04-09 – 2021-04-12 (×4): 1 via ORAL
  Filled 2021-04-09 (×4): qty 1

## 2021-04-09 MED ORDER — HYDROXYZINE HCL 25 MG PO TABS
50.0000 mg | ORAL_TABLET | Freq: Once | ORAL | Status: AC
  Administered 2021-04-09: 50 mg via ORAL
  Filled 2021-04-09: qty 2

## 2021-04-09 MED ORDER — METHYLPREDNISOLONE SODIUM SUCC 125 MG IJ SOLR: 125.0000 mg | Freq: Once | INTRAMUSCULAR | Status: AC

## 2021-04-09 MED ORDER — DIAZEPAM 2 MG PO TABS
2.0000 mg | ORAL_TABLET | Freq: Three times a day (TID) | ORAL | Status: AC
Start: 2021-04-09 — End: 2021-04-11
  Administered 2021-04-09 – 2021-04-10 (×5): 2 mg via ORAL
  Filled 2021-04-09 (×5): qty 1

## 2021-04-09 MED ORDER — TRAZODONE HCL 50 MG PO TABS
50.0000 mg | ORAL_TABLET | Freq: Every evening | ORAL | Status: DC | PRN
  Administered 2021-04-11: 50 mg via ORAL
  Filled 2021-04-09: qty 1

## 2021-04-09 MED ORDER — POTASSIUM CHLORIDE CRYS ER 20 MEQ PO TBCR
40.0000 meq | EXTENDED_RELEASE_TABLET | Freq: Once | ORAL | Status: AC
  Administered 2021-04-09: 40 meq via ORAL
  Filled 2021-04-09: qty 2

## 2021-04-09 MED ORDER — FENTANYL CITRATE PF 50 MCG/ML IJ SOSY
50.0000 ug | PREFILLED_SYRINGE | Freq: Once | INTRAMUSCULAR | Status: AC
  Administered 2021-04-09: 50 ug via INTRAVENOUS
  Filled 2021-04-09: qty 1

## 2021-04-09 MED ORDER — SODIUM CHLORIDE 0.9% FLUSH: 3.0000 mL | INTRAVENOUS | Status: DC | PRN

## 2021-04-09 MED ORDER — BISACODYL 10 MG RE SUPP: 10.0000 mg | Freq: Every day | RECTAL | Status: DC | PRN

## 2021-04-09 MED ORDER — SODIUM CHLORIDE 0.9 % IV BOLUS
1000.0000 mL | Freq: Once | INTRAVENOUS | Status: AC
  Administered 2021-04-09: 1000 mL via INTRAVENOUS

## 2021-04-09 MED ORDER — SODIUM CHLORIDE 0.9% FLUSH
3.0000 mL | Freq: Two times a day (BID) | INTRAVENOUS | Status: DC
  Administered 2021-04-10 – 2021-04-12 (×3): 3 mL via INTRAVENOUS

## 2021-04-09 MED ORDER — ASCORBIC ACID 500 MG PO TABS
500.0000 mg | ORAL_TABLET | Freq: Every day | ORAL | Status: DC
  Administered 2021-04-09 – 2021-04-12 (×4): 500 mg via ORAL
  Filled 2021-04-09 (×4): qty 1

## 2021-04-09 MED ORDER — ACETAMINOPHEN 650 MG RE SUPP: 650.0000 mg | Freq: Four times a day (QID) | RECTAL | Status: DC | PRN

## 2021-04-09 MED ORDER — FOLIC ACID 1 MG PO TABS
1.0000 mg | ORAL_TABLET | Freq: Every day | ORAL | Status: DC
  Administered 2021-04-09 – 2021-04-12 (×4): 1 mg via ORAL
  Filled 2021-04-09 (×4): qty 1

## 2021-04-09 MED ORDER — IOHEXOL 300 MG/ML  SOLN
100.0000 mL | Freq: Once | INTRAMUSCULAR | Status: AC | PRN
  Administered 2021-04-09: 100 mL via INTRAVENOUS

## 2021-04-09 MED ORDER — POTASSIUM CHLORIDE CRYS ER 20 MEQ PO TBCR
40.0000 meq | EXTENDED_RELEASE_TABLET | Freq: Once | ORAL | Status: DC
  Filled 2021-04-09: qty 2

## 2021-04-09 MED ORDER — ADULT MULTIVITAMIN W/MINERALS CH: 1.0000 | ORAL_TABLET | Freq: Every day | ORAL | Status: DC

## 2021-04-09 MED ORDER — DIPHENHYDRAMINE HCL 50 MG/ML IJ SOLN: 50.0000 mg | Freq: Once | INTRAMUSCULAR | Status: AC

## 2021-04-09 MED ORDER — MAGNESIUM SULFATE 2 GM/50ML IV SOLN
2.0000 g | Freq: Once | INTRAVENOUS | Status: AC
  Administered 2021-04-09: 2 g via INTRAVENOUS
  Filled 2021-04-09: qty 50

## 2021-04-09 MED ORDER — INFLUENZA VAC SPLIT QUAD 0.5 ML IM SUSY
0.5000 mL | PREFILLED_SYRINGE | INTRAMUSCULAR | Status: AC
  Administered 2021-04-10: 0.5 mL via INTRAMUSCULAR
  Filled 2021-04-09: qty 0.5

## 2021-04-09 MED ORDER — LORAZEPAM 2 MG/ML IJ SOLN: 1.0000 mg | INTRAMUSCULAR | Status: AC | PRN

## 2021-04-09 MED ORDER — THIAMINE HCL 100 MG PO TABS
100.0000 mg | ORAL_TABLET | Freq: Every day | ORAL | Status: DC
  Administered 2021-04-09 – 2021-04-12 (×3): 100 mg via ORAL
  Filled 2021-04-09 (×3): qty 1

## 2021-04-09 MED ORDER — LORAZEPAM 1 MG PO TABS: 1.0000 mg | ORAL_TABLET | ORAL | Status: AC | PRN

## 2021-04-09 MED ORDER — METHYLPREDNISOLONE SODIUM SUCC 125 MG IJ SOLR
INTRAMUSCULAR | Status: AC
  Administered 2021-04-09: 125 mg via INTRAVENOUS
  Filled 2021-04-09: qty 2

## 2021-04-09 MED ORDER — DIPHENHYDRAMINE HCL 50 MG/ML IJ SOLN
INTRAMUSCULAR | Status: AC
  Administered 2021-04-09: 50 mg via INTRAVENOUS
  Filled 2021-04-09: qty 1

## 2021-04-09 MED ORDER — SODIUM CHLORIDE 0.9 % IV SOLN: 250.0000 mL | INTRAVENOUS | Status: DC | PRN

## 2021-04-09 MED ORDER — VITAMIN D 25 MCG (1000 UNIT) PO TABS
1000.0000 [IU] | ORAL_TABLET | Freq: Every day | ORAL | Status: DC
  Administered 2021-04-09 – 2021-04-12 (×4): 1000 [IU] via ORAL
  Filled 2021-04-09 (×4): qty 1

## 2021-04-09 NOTE — Progress Notes (Signed)
Assisting [place] LPN in assessment of patient.

## 2021-04-09 NOTE — ED Provider Notes (Signed)
[facility] Emergency Department Provider Note MRN:  [id number]  Arrival date & time: 04/09/21     Chief Complaint   Colostomy Stoma BLeeding   History of Present Illness   ARIYONA Carey is a 50 y.o. year-old female with a history of hypertension presenting to the ED with chief complaint of colostomy bleeding.  Patient is endorsing significant bleeding from her colostomy site this evening, explains that it squirted across the room.  When she woke up covered in her own blood.  Also endorsing pain and some increased swelling around the colostomy site.  Denies any other complaints or symptoms, no lightheadedness, no chest pain or shortness of breath, no fever. Review of Systems  A complete 10 system review of systems was obtained and all systems are negative except as noted in the HPI and PMH.   Patient's Health History    Past Medical History:  Diagnosis Date   HTN (hypertension) 10/05/2020   Necrotizing fasciitis [facility])     Past Surgical History:  Procedure Laterality Date   BIOPSY  10/07/2020   Procedure: BIOPSY;  Surgeon: Daneil Dolin, MD;  Location: [facility];  Service: Endoscopy;;   COLONOSCOPY WITH PROPOFOL N/A 10/07/2020   single healing rectal ulcer in distal rectum with surrounding mucosal friability and markedly abnormal proximal colon with ulceration s/p biopsies.   COLONOSCOPY WITH PROPOFOL  02/22/2021   Procedure: COLONOSCOPY WITH PROPOFOL;  Surgeon: [name], DO;  Location: [facility];  Service: Endoscopy;;   ESOPHAGOGASTRODUODENOSCOPY (EGD) WITH PROPOFOL N/A 10/07/2020   non-bleeding gastric ulcer . Pathology with H.pylori negative   ESOPHAGOGASTRODUODENOSCOPY (EGD) WITH PROPOFOL N/A 12/27/2020   Procedure: ESOPHAGOGASTRODUODENOSCOPY (EGD) WITH PROPOFOL;  Surgeon: [name], DO;  Location: [facility];  Service: Endoscopy;  Laterality: N/A;  1:00pm   FLEXIBLE SIGMOIDOSCOPY N/A 01/11/2020   Procedure: FLEXIBLE  SIGMOIDOSCOPY;  Surgeon: Carol Ada, MD;  Location: [facility] ENDOSCOPY;  Service: Gastroenterology;  Laterality: N/A;   INCISION AND DRAINAGE PERIRECTAL ABSCESS N/A 12/25/2019   Procedure: IRRIGATION AND DEBRIDEMENT BUTTOCKS, LAP LOOP COLOSTOMY;  Surgeon: Greer Pickerel, MD;  Location: [facility] ORS;  Service: General;  Laterality: N/A;   IRRIGATION AND DEBRIDEMENT ABSCESS N/A 12/23/2019   Procedure: EXCISION AND DEBRIDEMENT LEFT BUTTOCK AND PERINEUM;  Surgeon: Clovis Riley, MD;  Location: [facility] OR[facility];  Service: General;  Laterality: N/A;   LAPAROSCOPIC LOOP COLOSTOMY N/A 12/25/2019   Procedure: LAPAROSCOPIC LOOP COLOSTOMY;  Surgeon: Greer Pickerel, MD;  Location: [facility] OR[facility];  Service: General;  Laterality: N/A;    Family History  Problem Relation Age of Onset   Colon polyps Mother        does not believe adenomas   Colon cancer Neg Hx     Social History   Socioeconomic History   Marital status: Single    Spouse name: Not on file   Number of children: 1   Years of education: Not on file   Highest education level: Not on file  Occupational History   Occupation: unknown  Tobacco Use   Smoking status: Never   Smokeless tobacco: Never  Vaping Use   Vaping Use: Never used  Substance and Sexual Activity   Alcohol use: Not Currently   Drug use: Not Currently   Sexual activity: Not Currently  Other Topics Concern   Not on file  Social History Narrative   Patient found in hotel room; unsure of living situation prior.   Social Determinants of Health   Financial Resource Strain:  Not on file  Food Insecurity: Not on file  Transportation Needs: Not on file  Physical Activity: Not on file  Stress: Not on file  Social Connections: Not on file  Intimate Partner Violence: Not on file     Physical Exam   Vitals:   04/09/21 0600 04/09/21 0700  BP: 93/66 94/72  Pulse: 85 85  Resp: 18 20  Temp:    SpO2: 100% 98%    CONSTITUTIONAL: Well-appearing, NAD NEURO:  Alert and oriented x 3, no focal  deficits EYES:  eyes equal and reactive ENT/NECK:  no LAD, no JVD CARDIO: Regular rate, well-perfused, normal S1 and S2 PULM:  CTAB no wheezing or rhonchi GI/GU:  normal bowel sounds, non-distended, non-tender, protruding hernia surrounding colostomy site, tender to palpation, colostomy is hemostatic MSK/SPINE:  No gross deformities, no edema SKIN:  no rash, atraumatic PSYCH:  Appropriate speech and behavior  *Additional and/or pertinent findings included in MDM below  Diagnostic and Interventional Summary    EKG Interpretation  Date/Time:    Ventricular Rate:    PR Interval:    QRS Duration:   QT Interval:    QTC Calculation:   R Axis:     Text Interpretation:         Labs Reviewed  CBC  COMPREHENSIVE METABOLIC PANEL  LIPASE, BLOOD  PROTIME-INR  APTT  TYPE AND SCREEN    CT ABDOMEN PELVIS W CONTRAST    (Results Pending)    Medications  sodium chloride 0.9 % bolus 1,000 mL (1,000 mLs Intravenous New Bag/Given 04/09/21 0652)  fentaNYL (SUBLIMAZE) injection 50 mcg (50 mcg Intravenous Given 04/09/21 0938)     Procedures  /  Critical Care Procedures  ED Course and Medical Decision Making  I have reviewed the triage vital signs, the nursing notes, and pertinent available records from the EMR.  Listed above are laboratory and imaging tests that I personally ordered, reviewed, and interpreted and then considered in my medical decision making (see below for details).  Bleeding from colostomy site, some blood pressures recorded as hypotensive however on my assessment blood pressure is normal and patient is well-perfused, conversant, well-appearing.  Still it is possible she lost a large amount of blood.  She has no active bleeding at this time, she is endorsing increased pain and swelling surrounding the colostomy site, will CT to evaluate for signs of active bleeding.     Awaiting CT, signed out to oncoming provider at shift change.  Barth Kirks. Sedonia Small, [place] [email or web address]  Final Clinical Impressions(s) / ED Diagnoses     ICD-10-CM   1. Bleeding from colostomy [facility])  K94.01       ED Discharge Orders     None        Discharge Instructions Discussed with and Provided to Patient:   Discharge Instructions   None       Maudie Flakes, MD 04/09/21 [phone]

## 2021-04-09 NOTE — Plan of Care (Signed)

## 2021-04-09 NOTE — ED Provider Notes (Signed)
Pt signed out by Dr. Sedonia Small pending labs and CT scan.  Pt was just d/c on 9/13 for symptomatic anemia and hypokalemia.  CT shows:  IMPRESSION:  Hepatic steatosis.     Minimal cholelithiasis.     Colostomy is again noted in the left lower quadrant with large  peristomal hernia which contains loops of nondilated large and small  bowel.      Hgb is back down to 7.2.  Pt was transfused 1 unit of blood on 9/11 and hgb went to 9.1.  It has gradually decreased since then. It was 7.9 on d/c.  Pt given 1 unit of blood in the ED.  Colostomy site painful, but CT does not show anything acute.  IMPRESSION:  Hepatic steatosis.     Minimal cholelithiasis.     Colostomy is again noted in the left lower quadrant with large  peristomal hernia which contains loops of nondilated large and small  bowel.    Pt's K and Mg are low.  They are both replaced.  Unfortunately, pt continues to drink alcohol.  Colostomy has no active bleeding now.  Pt d/w Dr. Denton Brick ([facility]) who will admit.  CRITICAL CARE Performed by: Isla Pence   Total critical care time: 30 minutes  Critical care time was exclusive of separately billable procedures and treating other patients.  Critical care was necessary to treat or prevent imminent or life-threatening deterioration.  Critical care was time spent personally by me on the following activities: development of treatment plan with patient and/or surrogate as well as nursing, discussions with consultants, evaluation of patient's response to treatment, examination of patient, obtaining history from patient or surrogate, ordering and performing treatments and interventions, ordering and review of laboratory studies, ordering and review of radiographic studies, pulse oximetry and re-evaluation of patient's condition.     Isla Pence, MD 04/09/21 1006

## 2021-04-09 NOTE — ED Notes (Signed)
Blood stopped due to the patient complaining of itching. Dr. Joesph Fillers was made aware and the pt was medicated.

## 2021-04-09 NOTE — H&P (Addendum)
Patient Demographics:    Alyssa Carey, is a 50 y.o. 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 Abbey Chatters dated 02/22/2021 noted with finding of friable mucosa -Discussed with Dr. Constance Haw--- does not appear to  have any indication for surgical intervention at this time -Transfuse 1 unit of PRBC  2)Alcoholic Liver Disease- AST 81, ALT 26--ratio is consistent with alcoholic liver disease -T bili 6.2, alk phos 147 -Avoid hepatotoxic agents  3) acute alcohol intoxication---BAL--84 -Very high risk for DTs -Lorazepam per CIWA protocol thiamine and folic acid as ordered  4) electrolyte derangement-- -Magnesium-1.4, Potassium 2.3, Na 134, Albumin is 1.6 so  corrected calcium is > 9 -Replace mag and potassium  5)PUD/alcoholic gastritis----EGD June 2022: with Gastritis. Protonix as ordered  6)H/o necrotizing fasciitis necessitating diverting colostomy in June 2021,with a large parastomal hernia --- supportive care  Disposition/Need for in-Hospital Stay- patient unable to be discharged at this time due to --- acute on chronic symptomatic anemia due to GI blood loss requiring transfusion and further stabilization*  Dispo: The patient is from: Home               Anticipated d/c is to: Home              Anticipated d/c date is: 2 days              Patient currently is not medically stable to d/c. Barriers: Not Clinically Stable-    With History of - Reviewed by me  Past Medical History:  Diagnosis Date   HTN (hypertension) 10/05/2020   Necrotizing fasciitis [facility])       Past Surgical History:  Procedure Laterality Date   BIOPSY  10/07/2020   Procedure: BIOPSY;  Surgeon: Daneil Dolin, MD;  [facility];  Service: Endoscopy;;   COLONOSCOPY WITH PROPOFOL N/A 10/07/2020   single healing rectal ulcer in distal rectum with surrounding mucosal friability and markedly abnormal proximal colon with ulceration s/p biopsies.   COLONOSCOPY WITH PROPOFOL  02/22/2021   Procedure: COLONOSCOPY WITH PROPOFOL;  Surgeon: [name], DO;  [facility];  Service: Endoscopy;;   ESOPHAGOGASTRODUODENOSCOPY (EGD) WITH PROPOFOL N/A 10/07/2020   non-bleeding gastric ulcer . Pathology with H.pylori negative   ESOPHAGOGASTRODUODENOSCOPY (EGD) WITH PROPOFOL N/A 12/27/2020   Procedure: ESOPHAGOGASTRODUODENOSCOPY (EGD) WITH PROPOFOL;  Surgeon: [name], DO;  [facility];  Service: Endoscopy;  Laterality: N/A;  1:00pm   FLEXIBLE SIGMOIDOSCOPY N/A 01/11/2020   Procedure: FLEXIBLE SIGMOIDOSCOPY;  Surgeon: Carol Ada, MD;  Location: [facility];  Service: Gastroenterology;  Laterality: N/A;   INCISION AND DRAINAGE PERIRECTAL ABSCESS N/A 12/25/2019   Procedure: IRRIGATION AND DEBRIDEMENT BUTTOCKS, LAP LOOP COLOSTOMY;  Surgeon: Greer Pickerel, MD;  Location: [facility];  Service: General;  Laterality: N/A;   IRRIGATION AND DEBRIDEMENT ABSCESS N/A 12/23/2019   Procedure: EXCISION AND DEBRIDEMENT LEFT BUTTOCK AND PERINEUM;  Surgeon: Clovis Riley, MD;  Location: [facility];  Service: General;  Laterality: N/A;   LAPAROSCOPIC LOOP COLOSTOMY N/A 12/25/2019   Procedure: LAPAROSCOPIC LOOP COLOSTOMY;  Surgeon: Greer Pickerel, MD;  Location: [facility];   Service: General;  Laterality: N/A;    Chief Complaint  Patient presents with   Colostomy Stoma BLeeding      HPI:    Alyssa Carey  is a 50 y.o. female with past medical history relevant for  HTN, necrotizing fasciitis requiring colostomy/ileostomy with large parastomal hernia and ongoing alcohol abuse with alcoholic liver disease and PUD who presents with fatigue, generalized weakness and bleeding from her colostomy, also complains of abdominal swelling/tightness, no emesis no fevers no chills -Patient admits to ongoing alcohol use and BAL is 84 -Patient states that bleeding from her colostomy site was significant--she had a pool of blood - Magnesium-1.4, Potassium 2.3, Na 134, Albumin is 1.6 so  corrected calcium is > 9 Lipase is 45, T Bili 6.2, AST 81, ALT 26, Alk Phos 147 BAL--84 INR 1.2 -WBC 8.2, Hgb 7.2, Platelets 201, MCV 103.3   Review of systems:    In addition to the HPI above,   A full Review of  Systems was done, all other systems reviewed are negative except as noted above in HPI , .    Social History:  Reviewed by me    Social History   Tobacco Use   Smoking status: Never   Smokeless tobacco: Never  Substance Use Topics   Alcohol use: Not Currently       Family History :  Reviewed by me    Family History  Problem Relation Age of Onset   Colon polyps Mother        does not believe adenomas   Colon cancer Neg Hx      Home Medications:   Prior to Admission medications   Medication Sig Start Date End Date Taking? Authorizing Provider  cholecalciferol (VITAMIN D3) 25 MCG (1000 UNIT) tablet Take 1,000 Units by mouth daily.   Yes [provider]  famotidine (PEPCID) 20 MG tablet Take 1 tablet (20 mg total) by mouth 2 (two) times daily. 03/29/21 04/28/21 Yes Barb Merino, MD  Multiple Vitamin (MULTIVITAMIN WITH MINERALS) TABS tablet Take 1 tablet by mouth daily. 03/29/21 04/28/21 Yes Barb Merino, MD  Nutritional Supplements (BOOST PO)  Take 1 Bottle by mouth 4 (four) times daily.   Yes [provider]  vitamin C (ASCORBIC ACID) 500 MG tablet Take 500 mg by mouth daily.   Yes [provider]  ondansetron (ZOFRAN) 4 MG tablet Take 1 tablet (4 mg total) by mouth every 8 (eight) hours as needed for nausea or vomiting. Patient not taking: No sig reported 02/22/21 04/23/21  [name], DO     Allergies:     Allergies  Allergen Reactions   Pantoprazole     Stomach pain   Penicillins Hives    Did it involve  swelling of the face/tongue/throat, SOB, or low BP? N Did it involve sudden or severe rash/hives, skin peeling, or any reaction on the inside of your mouth or nose? Y Did you need to seek medical attention at a hospital or doctor's office? N When did it last happen?  Childhood     If all above answers are "NO", may proceed with cephalosporin use.    Oxycodone Hcl Rash     Physical Exam:   Vitals  Blood pressure 116/89, pulse (!) 106, temperature 97.9 F (36.6 C), temperature source Oral, resp. rate 17, height 4' 9"  (1.448 m), weight 72 kg, SpO2 100 %.  Physical Examination: General appearance - alert,  and in no distress  Mental status - alert, oriented to person, place, and time,  Eyes - sclera is icteric Neck - supple, no JVD elevation , Chest - clear  to auscultation bilaterally, symmetrical air movement,  Heart - S1 and S2 normal, regular  Abdomen - soft, ostomy with parastomal hernia , mild epigastric discomfort without rebound, bowel sounds are present neurological - screening mental status exam normal, neck supple without rigidity, cranial nerves II through XII intact, DTR's normal and symmetric Extremities - 1 +ve  pedal edema noted, intact peripheral pulses  Skin - warm, dry     Data Review:    CBC Recent Labs  Lab 04/09/21 0713  WBC 8.2  HGB 7.2*  HCT 22.2*  PLT 201  MCV 103.3*  MCH 33.5  MCHC 32.4  RDW 16.3*    ------------------------------------------------------------------------------------------------------------------  Chemistries  Recent Labs  Lab 04/09/21 0713 04/09/21 0844  NA 134*  --   K 2.3*  --   CL 97*  --   CO2 26  --   GLUCOSE 83  --   BUN 5*  --   CREATININE 0.42*  --   CALCIUM 7.2*  --   MG  --  1.4*  AST 81*  --   ALT 26  --   ALKPHOS 147*  --   BILITOT 6.2*  --    ------------------------------------------------------------------------------------------------------------------ estimated creatinine clearance is 71.4 mL/min (A) (by C-G formula based on SCr of 0.42 mg/dL (L)). ------------------------------------------------------------------------------------------------------------------ No results for input(s): TSH, T4TOTAL, T3FREE, THYROIDAB in the last 72 hours.  Invalid input(s): FREET3   Coagulation profile Recent Labs  Lab 04/09/21 0713  INR 1.2   ------------------------------------------------------------------------------------------------------------------- No results for input(s): DDIMER in the last 72 hours. -------------------------------------------------------------------------------------------------------------------  Cardiac Enzymes No results for input(s): CKMB, TROPONINI, MYOGLOBIN in the last 168 hours.  Invalid input(s): CK ------------------------------------------------------------------------------------------------------------------ No results found for: BNP   ---------------------------------------------------------------------------------------------------------------  Urinalysis    Component Value Date/Time   COLORURINE BROWN (A) 03/23/2021 2000   APPEARANCEUR CLEAR 03/23/2021 2000   LABSPEC 1.025 03/23/2021 2000   PHURINE 6.5 03/23/2021 2000   GLUCOSEU 100 (A) 03/23/2021 2000   HGBUR NEGATIVE 03/23/2021 2000   BILIRUBINUR LARGE (A) 03/23/2021 2000   KETONESUR 15 (A) 03/23/2021 2000   PROTEINUR 30 (A) 03/23/2021  2000   NITRITE POSITIVE (A) 03/23/2021 2000   LEUKOCYTESUR TRACE (A) 03/23/2021 2000    ----------------------------------------------------------------------------------------------------------------   Imaging Results:    CT ABDOMEN PELVIS W CONTRAST  Result Date: 04/09/2021 CLINICAL DATA:  Acute generalized abdominal pain. EXAM: CT ABDOMEN AND PELVIS WITH CONTRAST TECHNIQUE: Multidetector CT imaging of the abdomen and pelvis was performed using the standard protocol following bolus administration of intravenous contrast. CONTRAST:  141m OMNIPAQUE IOHEXOL 300 MG/ML  SOLN COMPARISON:  March 23, 2021. FINDINGS: Lower chest: No  acute abnormality. Hepatobiliary: Minimal cholelithiasis is noted. No biliary dilatation is noted. Hepatic steatosis is noted. Pancreas: Unremarkable. No pancreatic ductal dilatation or surrounding inflammatory changes. Spleen: Normal in size without focal abnormality. Adrenals/Urinary Tract: Adrenal glands are unremarkable. Kidneys are normal, without renal calculi, focal lesion, or hydronephrosis. Bladder is unremarkable. Stomach/Bowel: Stomach appears normal. Colostomy is noted in left lower quadrant with large peristomal hernia which contains loops of nondilated large and small bowel. There is no evidence of bowel obstruction or acute inflammation. Vascular/Lymphatic: No significant vascular findings are present. No enlarged abdominal or pelvic lymph nodes. Reproductive: Uterus and bilateral adnexa are unremarkable. Other: No abdominal wall hernia or abnormality. No abdominopelvic ascites. Musculoskeletal: No acute or significant osseous findings. IMPRESSION: Hepatic steatosis. Minimal cholelithiasis. Colostomy is again noted in the left lower quadrant with large peristomal hernia which contains loops of nondilated large and small bowel. Electronically Signed   By: Marijo Conception M.D.   On: 04/09/2021 09:46    Radiological Exams on Admission: CT ABDOMEN PELVIS W  CONTRAST  Result Date: 04/09/2021 CLINICAL DATA:  Acute generalized abdominal pain. EXAM: CT ABDOMEN AND PELVIS WITH CONTRAST TECHNIQUE: Multidetector CT imaging of the abdomen and pelvis was performed using the standard protocol following bolus administration of intravenous contrast. CONTRAST:  135m OMNIPAQUE IOHEXOL 300 MG/ML  SOLN COMPARISON:  March 23, 2021. FINDINGS: Lower chest: No acute abnormality. Hepatobiliary: Minimal cholelithiasis is noted. No biliary dilatation is noted. Hepatic steatosis is noted. Pancreas: Unremarkable. No pancreatic ductal dilatation or surrounding inflammatory changes. Spleen: Normal in size without focal abnormality. Adrenals/Urinary Tract: Adrenal glands are unremarkable. Kidneys are normal, without renal calculi, focal lesion, or hydronephrosis. Bladder is unremarkable. Stomach/Bowel: Stomach appears normal. Colostomy is noted in left lower quadrant with large peristomal hernia which contains loops of nondilated large and small bowel. There is no evidence of bowel obstruction or acute inflammation. Vascular/Lymphatic: No significant vascular findings are present. No enlarged abdominal or pelvic lymph nodes. Reproductive: Uterus and bilateral adnexa are unremarkable. Other: No abdominal wall hernia or abnormality. No abdominopelvic ascites. Musculoskeletal: No acute or significant osseous findings. IMPRESSION: Hepatic steatosis. Minimal cholelithiasis. Colostomy is again noted in the left lower quadrant with large peristomal hernia which contains loops of nondilated large and small bowel. Electronically Signed   By: JMarijo ConceptionM.D.   On: 04/09/2021 09:46    DVT Prophylaxis -SCD   AM Labs Ordered, also please review Full Orders  Family Communication: Admission, patients condition and plan of care including tests being ordered have been discussed with the patient  who indicate understanding and agree with the plan   Code Status - Full Code  Likely DC to  home    Condition   stable CRoxan HockeyM.D on 04/09/2021 at 2:01 PM Go to [email or web address] -  for contact info  [facility] - Office  [phone]

## 2021-04-09 NOTE — ED Triage Notes (Signed)
Pt states she has been bleeding heavily from her colostomy stoma. Pt also has increased swelling to her L abdomen.

## 2021-04-09 NOTE — ED Notes (Signed)
Pt still itching 45 minutes after MD notified. MD wants to restart blood since pt had no sx other than itching. Wants to keep rate at 150 ml/hr and finish bag.

## 2021-04-09 NOTE — ED Notes (Signed)
Colostomy stoma cleaned and new bag put in place

## 2021-04-09 NOTE — ED Notes (Signed)
Called blood bank, blood not ready at this time.

## 2021-04-09 NOTE — ED Notes (Signed)
Transported to CT 

## 2021-04-09 NOTE — ED Notes (Signed)
Colostomy care completed, new bag placed.

## 2021-04-10 LAB — COMPREHENSIVE METABOLIC PANEL
ALT: 29 U/L (ref 0–44)
AST: 86 U/L — ABNORMAL HIGH (ref 15–41)
Albumin: 1.6 g/dL — ABNORMAL LOW (ref 3.5–5.0)
Alkaline Phosphatase: 140 U/L — ABNORMAL HIGH (ref 38–126)
Anion gap: 8 (ref 5–15)
BUN: <5 mg/dL — ABNORMAL LOW (ref 6–20)
CO2: 29 mmol/L (ref 22–32)
Calcium: 7.9 mg/dL — ABNORMAL LOW (ref 8.9–10.3)
Chloride: 96 mmol/L — ABNORMAL LOW (ref 98–111)
Creatinine, Ser: 0.41 mg/dL — ABNORMAL LOW (ref 0.44–1.00)
GFR, Estimated: >60 mL/min (ref >60)
Glucose, Bld: 129 mg/dL — ABNORMAL HIGH (ref 70–99)
Potassium: 3.3 mmol/L — ABNORMAL LOW (ref 3.5–5.1)
Sodium: 133 mmol/L — ABNORMAL LOW (ref 135–145)
Total Bilirubin: 5.7 mg/dL — ABNORMAL HIGH (ref 0.3–1.2)
Total Protein: 5.6 g/dL — ABNORMAL LOW (ref 6.5–8.1)

## 2021-04-10 LAB — CBC
HCT: 23.1 % — ABNORMAL LOW (ref 36.0–46.0)
Hemoglobin: 7.7 g/dL — ABNORMAL LOW (ref 12.0–15.0)
MCH: 32.9 pg (ref 26.0–34.0)
MCHC: 33.3 g/dL (ref 30.0–36.0)
MCV: 98.7 fL (ref 80.0–100.0)
Platelets: 207 10*3/uL (ref 150–400)
RBC: 2.34 MIL/uL — ABNORMAL LOW (ref 3.87–5.11)
RDW: 17.5 % — ABNORMAL HIGH (ref 11.5–15.5)
WBC: 9.6 10*3/uL (ref 4.0–10.5)
nRBC: 0 % (ref 0.0–0.2)

## 2021-04-10 LAB — MAGNESIUM: Magnesium: 1.8 mg/dL (ref 1.7–2.4)

## 2021-04-10 LAB — TYPE AND SCREEN
ABO/RH(D): A POS
Antibody Screen: NEGATIVE

## 2021-04-10 MED ORDER — HYDROXYZINE HCL 25 MG PO TABS
50.0000 mg | ORAL_TABLET | Freq: Once | ORAL | Status: AC
  Administered 2021-04-10: 50 mg via ORAL
  Filled 2021-04-10: qty 2

## 2021-04-10 MED ORDER — SODIUM CHLORIDE 0.9% IV SOLUTION: Freq: Once | INTRAVENOUS | Status: AC

## 2021-04-10 MED ORDER — DIAZEPAM 2 MG PO TABS: 2.0000 mg | ORAL_TABLET | Freq: Once | ORAL | Status: DC

## 2021-04-10 MED ORDER — POTASSIUM CHLORIDE CRYS ER 20 MEQ PO TBCR
40.0000 meq | EXTENDED_RELEASE_TABLET | ORAL | Status: AC
  Administered 2021-04-10 (×2): 40 meq via ORAL
  Filled 2021-04-10 (×2): qty 2

## 2021-04-10 MED ORDER — FUROSEMIDE 10 MG/ML IJ SOLN
20.0000 mg | Freq: Once | INTRAMUSCULAR | Status: AC
  Administered 2021-04-10: 20 mg via INTRAVENOUS
  Filled 2021-04-10: qty 2

## 2021-04-10 MED ORDER — DIPHENHYDRAMINE HCL 50 MG/ML IJ SOLN
25.0000 mg | Freq: Once | INTRAMUSCULAR | Status: AC
  Administered 2021-04-10: 25 mg via INTRAVENOUS
  Filled 2021-04-10: qty 1

## 2021-04-10 NOTE — Progress Notes (Addendum)
   04/10/21 1621  Vitals  Temp 98.2 F (36.8 C)  BP 97/64  MAP (mmHg) 75  BP Location Left Arm  BP Method Automatic  Patient Position (if appropriate) Sitting  Pulse Rate 96  Pulse Rate Source Monitor  Resp 18  MEWS COLOR  MEWS Score Color Green  Oxygen Therapy  SpO2 100 %  MEWS Score  MEWS Temp 0  MEWS Systolic 1  MEWS Pulse 0  MEWS RR 0  MEWS LOC 0  MEWS Score 1   Arrived to [place] room 1511 from [facility] via [facility] transportation. Previously received report from [place] at [place] stable upon arrival, see above flowsheets. Ambulatory to bathroom. Alert, oriented, no complaints or signs of acute distress.

## 2021-04-10 NOTE — Progress Notes (Signed)
Call placed to Patient Placement to inquire about which hospitalist would be caring for patient as she is now at [facility], staff in Patient Placement stated that Dr. Cyndi Bender would be caring for patient. Staff in Patient Placement also stated that they would change attending provider in treatment team.

## 2021-04-10 NOTE — Consult Note (Signed)
Referring Provider: No ref. provider found Primary Care Physician:  [facility] Primary Gastroenterologist:  Dr. Benson Norway  Reason for Consultation: Blood per colostomy  HPI: 50 year old lady with history of alcohol abuse  - status post diverting loop colostomy previously for necrotizing fasciitis admitted yesterday after noting blood coming out of her colostomy.  She has a known history of a large parastomal hernia.  Hemodynamically stable in the ED.  Initial hemoglobin 7.2 was given 1 unit of packed RBCs follow-up hemoglobin 10.3 this was repeated this morning and it was 7.7.  Patient notes at home having pain around her ostomy and is noted intermittent bleeding from her ostomy at times when the ostomy is manipulated (blood/blood-tinged fluid actually squirts out of the opening).  She is receiving a second unit of blood.  She has remained entirely stable and is in a regular ward room. Patient also reports she has scant yellow mucus coming out of her rectum from time to time.  No blood.  Hemoglobin a month ago was 7.9.  She has not had black stools coming out of her ostomy; she has not had any hematemesis.  Recently admitted for a bout of alcoholic hepatitis.  Appetite is picked up quite a bit since she was admitted previously.  She is able to eat without any nausea or vomiting.  She has not had a fever.  Apparently, she saw Dr. Leighton Ruff about a month ago to discuss takedown of her loop colostomy.  We saw this lady back in March for bleeding per colostomy.  I performed a colonoscopy through the ostomy distal and proximal limbs were well seen she had a distal rectal ulcer and multiple skipped  ulcers in the proximal loop nearly all the way to the cecum (nonspecific ulceration on path); the distal loop was normal except for healing distal rectal ulcer..  Patient followed-up with Dr. Abbey Chatters and underwent colonoscopy through colostomy about a month ago and was found to have a normal  colon-both limbs.  Rectal ulcer had healed although there was rectal mucosal friability. EGD earlier this year revealed non-H. pylori gastritis.  Patient denies taking NSAIDs. There was no evidence of polyp or neoplasm on her 2 recent colonoscopies. LFTs when she was admitted with alcoholic hepatitis have improved at this admission  - AST ALT 86/29 total bilirubin 5.7 alkaline phosphatase 140;  it is notable her albumin is low at 1.6. Patient denies NSAIDs.   Past Medical History:  Diagnosis Date   HTN (hypertension) 10/05/2020   Necrotizing fasciitis [facility])     Past Surgical History:  Procedure Laterality Date   BIOPSY  10/07/2020   Procedure: BIOPSY;  Surgeon: Daneil Dolin, MD;  Location: [facility];  Service: Endoscopy;;   COLONOSCOPY WITH PROPOFOL N/A 10/07/2020   single healing rectal ulcer in distal rectum with surrounding mucosal friability and markedly abnormal proximal colon with ulceration s/p biopsies.   COLONOSCOPY WITH PROPOFOL  02/22/2021   Procedure: COLONOSCOPY WITH PROPOFOL;  Surgeon: [name], DO;  Location: [facility];  Service: Endoscopy;;   ESOPHAGOGASTRODUODENOSCOPY (EGD) WITH PROPOFOL N/A 10/07/2020   non-bleeding gastric ulcer . Pathology with H.pylori negative   ESOPHAGOGASTRODUODENOSCOPY (EGD) WITH PROPOFOL N/A 12/27/2020   Procedure: ESOPHAGOGASTRODUODENOSCOPY (EGD) WITH PROPOFOL;  Surgeon: [name], DO;  Location: [facility];  Service: Endoscopy;  Laterality: N/A;  1:00pm   FLEXIBLE SIGMOIDOSCOPY N/A 01/11/2020   Procedure: FLEXIBLE SIGMOIDOSCOPY;  Surgeon: Carol Ada, MD;  Location: [facility];  Service:  Gastroenterology;  Laterality: N/A;   INCISION AND DRAINAGE PERIRECTAL ABSCESS N/A 12/25/2019   Procedure: IRRIGATION AND DEBRIDEMENT BUTTOCKS, LAP LOOP COLOSTOMY;  Surgeon: Greer Pickerel, MD;  Location: [facility];  Service: General;  Laterality: N/A;   IRRIGATION AND DEBRIDEMENT ABSCESS N/A 12/23/2019   Procedure: EXCISION AND DEBRIDEMENT  LEFT BUTTOCK AND PERINEUM;  Surgeon: Clovis Riley, MD;  Location: [facility];  Service: General;  Laterality: N/A;   LAPAROSCOPIC LOOP COLOSTOMY N/A 12/25/2019   Procedure: LAPAROSCOPIC LOOP COLOSTOMY;  Surgeon: Greer Pickerel, MD;  Location: [facility];  Service: General;  Laterality: N/A;    Prior to Admission medications   Medication Sig Start Date End Date Taking? Authorizing Provider  cholecalciferol (VITAMIN D3) 25 MCG (1000 UNIT) tablet Take 1,000 Units by mouth daily.   Yes [provider]  famotidine (PEPCID) 20 MG tablet Take 1 tablet (20 mg total) by mouth 2 (two) times daily. 03/29/21 04/28/21 Yes Barb Merino, MD  Multiple Vitamin (MULTIVITAMIN WITH MINERALS) TABS tablet Take 1 tablet by mouth daily. 03/29/21 04/28/21 Yes Barb Merino, MD  Nutritional Supplements (BOOST PO) Take 1 Bottle by mouth 4 (four) times daily.   Yes [provider]  vitamin C (ASCORBIC ACID) 500 MG tablet Take 500 mg by mouth daily.   Yes [provider]  ondansetron (ZOFRAN) 4 MG tablet Take 1 tablet (4 mg total) by mouth every 8 (eight) hours as needed for nausea or vomiting. Patient not taking: No sig reported 02/22/21 04/23/21  [name], DO    Current Facility-Administered Medications  Medication Dose Route Frequency Provider Last Rate Last Admin   0.9 %  sodium chloride infusion  250 mL Intravenous PRN Emokpae, Courage, MD       acetaminophen (TYLENOL) tablet 650 mg  650 mg Oral Q6H PRN Denton Brick, Courage, MD   650 mg at 04/10/21 0807   Or   acetaminophen (TYLENOL) suppository 650 mg  650 mg Rectal Q6H PRN Emokpae, Courage, MD       ascorbic acid (VITAMIN C) tablet 500 mg  500 mg Oral Daily Emokpae, Courage, MD   500 mg at 04/10/21 0805   bisacodyl (DULCOLAX) suppository 10 mg  10 mg Rectal Daily PRN Roxan Hockey, MD       cholecalciferol (VITAMIN D3) tablet 1,000 Units  1,000 Units Oral Daily Emokpae, Courage, MD   1,000 Units at 04/10/21 0807   diazepam (VALIUM)  tablet 2 mg  2 mg Oral TID Roxan Hockey, MD   2 mg at 04/10/21 0806   famotidine (PEPCID) IVPB 20 mg premix  20 mg Intravenous Q12H Roxan Hockey, MD   Stopped at 04/09/21 1555   feeding supplement (ENSURE ENLIVE / ENSURE PLUS) liquid 237 mL  237 mL Oral QID Emokpae, Courage, MD       folic acid (FOLVITE) tablet 1 mg  1 mg Oral Daily Emokpae, Courage, MD   1 mg at 04/10/21 0160   furosemide (LASIX) injection 20 mg  20 mg Intravenous Once Emokpae, Courage, MD       influenza vac split quadrivalent PF (FLUARIX) injection 0.5 mL  0.5 mL Intramuscular Tomorrow-1000 Emokpae, Courage, MD       LORazepam (ATIVAN) tablet 1-4 mg  1-4 mg Oral Q1H PRN Emokpae, Courage, MD       Or   LORazepam (ATIVAN) injection 1-4 mg  1-4 mg Intravenous Q1H PRN Emokpae, Courage, MD       multivitamin with minerals tablet 1 tablet  1 tablet Oral Daily  Roxan Hockey, MD   1 tablet at 04/10/21 0807   polyethylene glycol (MIRALAX / GLYCOLAX) packet 17 g  17 g Oral Daily PRN Emokpae, Courage, MD       sodium chloride flush (NS) 0.9 % injection 3 mL  3 mL Intravenous Q12H Emokpae, Courage, MD   3 mL at 04/10/21 6834   sodium chloride flush (NS) 0.9 % injection 3 mL  3 mL Intravenous Q12H Emokpae, Courage, MD   3 mL at 04/10/21 1962   sodium chloride flush (NS) 0.9 % injection 3 mL  3 mL Intravenous PRN Emokpae, Courage, MD       thiamine tablet 100 mg  100 mg Oral Daily Emokpae, Courage, MD   100 mg at 04/10/21 2297   Or   thiamine (B-1) injection 100 mg  100 mg Intravenous Daily Emokpae, Courage, MD       traZODone (DESYREL) tablet 50 mg  50 mg Oral QHS PRN Roxan Hockey, MD        Allergies as of 04/09/2021 - Review Complete 04/09/2021  Allergen Reaction Noted   Pantoprazole  02/17/2021   Penicillins Hives 12/21/2019   Oxycodone hcl Rash 10/06/2020    Family History  Problem Relation Age of Onset   Colon polyps Mother        does not believe adenomas   Colon cancer Neg Hx     Social History    Socioeconomic History   Marital status: Single    Spouse name: Not on file   Number of children: 1   Years of education: Not on file   Highest education level: Not on file  Occupational History   Occupation: unknown  Tobacco Use   Smoking status: Never   Smokeless tobacco: Never  Vaping Use   Vaping Use: Never used  Substance and Sexual Activity   Alcohol use: Not Currently   Drug use: Not Currently   Sexual activity: Not Currently  Other Topics Concern   Not on file  Social History Narrative   Patient found in hotel room; unsure of living situation prior.   Social Determinants of Health   Financial Resource Strain: Not on file  Food Insecurity: Not on file  Transportation Needs: Not on file  Physical Activity: Not on file  Stress: Not on file  Social Connections: Not on file  Intimate Partner Violence: Not on file    Review of Systems:  Physical Exam: Vital signs in last 24 hours: Temp:  [98 F (36.7 C)-98.2 F (36.8 C)] 98 F (36.7 C) (09/25 1023) Pulse Rate:  [79-116] 93 (09/25 1155) Resp:  [14-27] 16 (09/25 1023) BP: (73-116)/(50-89) 84/52 (09/25 1155) SpO2:  [82 %-100 %] 98 % (09/25 1155) Last BM Date: 04/09/21 General:   Alert,   pleasant and cooperative in NAD Neck:  Supple; no masses or thyromegaly. Lungs:  Clear throughout to auscultation.   No wheezes, crackles, or rhonchi. No acute distress. Heart:  Regular rate and rhythm; no murmurs, clicks, rubs,  or gallops. Abdomen: Bulging left lower quadrant with ostomy in place.  Blood-tinged mucus in the bag.  Digital exam moderately tender.  No mass. Intake/Output from previous day: 09/24 0701 - 09/25 0700 In: 2891.9 [P.O.:1160; I.V.:83; Blood:500; IV Piggyback:1148.9] Out: 550 [Urine:550] Intake/Output this shift: Total I/O In: 315 [Blood:315] Out: -   Lab Results: Recent Labs    04/09/21 0713 04/09/21 1600 04/10/21 0509  WBC 8.2 10.4 9.6  HGB 7.2* 10.3* 7.7*  HCT 22.2* 30.9* 23.1*  PLT  [place]    04/09/21 0713 04/10/21 0509  NA 134* 133*  K 2.3* 3.3*  CL 97* 96*  CO2 26 29  GLUCOSE 83 129*  BUN 5* <5*  CREATININE 0.42* 0.41*  CALCIUM 7.2* 7.9*   LFT Recent Labs    04/10/21 0509  PROT 5.6*  ALBUMIN 1.6*  AST 86*  ALT 29  ALKPHOS 140*  BILITOT 5.7*   PT/INR Recent Labs    04/09/21 0713  LABPROT 15.2  INR 1.2   Hepatitis Panel No results for input(s): HEPBSAG, HCVAB, HEPAIGM, HEPBIGM in the last 72 hours. C-Diff No results for input(s): CDIFFTOX in the last 72 hours.  Studies/Results: CT ABDOMEN PELVIS W CONTRAST  Result Date: 04/09/2021 CLINICAL DATA:  Acute generalized abdominal pain. EXAM: CT ABDOMEN AND PELVIS WITH CONTRAST TECHNIQUE: Multidetector CT imaging of the abdomen and pelvis was performed using the standard protocol following bolus administration of intravenous contrast. CONTRAST:  161m OMNIPAQUE IOHEXOL 300 MG/ML  SOLN COMPARISON:  March 23, 2021. FINDINGS: Lower chest: No acute abnormality. Hepatobiliary: Minimal cholelithiasis is noted. No biliary dilatation is noted. Hepatic steatosis is noted. Pancreas: Unremarkable. No pancreatic ductal dilatation or surrounding inflammatory changes. Spleen: Normal in size without focal abnormality. Adrenals/Urinary Tract: Adrenal glands are unremarkable. Kidneys are normal, without renal calculi, focal lesion, or hydronephrosis. Bladder is unremarkable. Stomach/Bowel: Stomach appears normal. Colostomy is noted in left lower quadrant with large peristomal hernia which contains loops of nondilated large and small bowel. There is no evidence of bowel obstruction or acute inflammation. Vascular/Lymphatic: No significant vascular findings are present. No enlarged abdominal or pelvic lymph nodes. Reproductive: Uterus and bilateral adnexa are unremarkable. Other: No abdominal wall hernia or abnormality. No abdominopelvic ascites. Musculoskeletal: No acute or significant osseous  findings. IMPRESSION: Hepatic steatosis. Minimal cholelithiasis. Colostomy is again noted in the left lower quadrant with large peristomal hernia which contains loops of nondilated large and small bowel. Electronically Signed   By: JMarijo ConceptionM.D.   On: 04/09/2021 09:46    Impression: 50year old lady admitted to the hospital with bleeding per colostomy.  She has a history of diverting loop colostomy last year for necrotizing fasciitis.  She has a chronic anemia and has had intermittent blood per colostomy and pain this year.  Earlier this year, she was found to have skipped ulcerations in the upstream limb of her colon.  Biopsies nonspecific.  She had a distal rectal ulcer as well.  Follow-up colonoscopy (both limbs) about a month ago demonstrated normal colon friable rectum but previously noted rectal ulcer and healed.  I reviewed yesterdays CT scan with Dr. GNyoka Cowden  Obvious left colon in the hernia sac along with a portion of transverse segment   No focal dilation and no pneumatosis.  There are some fluid-filled loops within the hernia sac which is large.  Interval history of alcoholic hepatitis as outlined above.  Although we do not see evidence of strangulation of large bowel within the hernia sac, I feel her bleeding and prior colonic ulcer seen are a result of intermittent mechanical forces producing variable degrees of ischemia in the sequestered portion of her colon.   Skipped ulceration seen proximally and resolution on follow-up colonoscopy adds to my concerns for varying degrees of ischemia.  Scenario not consistent with inflammatory bowel disease.  No evidence of neoplasia on 2 colonoscopies this year.  I do not feel a third colonoscopy here would be best practice.  Recommendations: Agree with transfusion.  She certainly does have a significant acute on chronic anemia requiring transfusion, but I do not believe she is having an acute life threatening bleed  I would recommend that she  get back to her primary GI/surgical team in [place] to formulate a game plan.  I suspect her symptoms will resolve once her colostomy is taken down.  Thank you for allow me to see this nice lady once again in consultation.           Notice:  This dictation was prepared with Dragon dictation along with smaller phrase technology. Any transcriptional errors that result from this process are unintentional and may not be corrected upon review.

## 2021-04-10 NOTE — Progress Notes (Signed)
Patient had an episode of bleeding from stoma.  Instructed patient to hold pressure.  Patient held pressure to stoma and bleeding stopped after five minutes.  Patient changed colostomy bag and started to eat her clear liquid diet requests. Vitals stable, will continue to monitor patient.

## 2021-04-10 NOTE — Progress Notes (Signed)
I just was made aware of Dr. Rolly Salter further consideration of imaging from yesterday.  There may be some changes on cross-sectional imaging from yesterday which suggest the possibility of ischemic colon within the hernia sac. This continues to be my clinical impression (see my earlier consultation note).  I have discussed with Dr. Joesph Fillers.  She may benefit from a repeat CT.  However, I think best practice initially would just get her transferred down to her primary GI/surgical team and they can further evaluate as they deem appropriate.

## 2021-04-10 NOTE — Clinical Social Work Note (Signed)
Patient provided SA resources during last visit on 03/24/2021.

## 2021-04-10 NOTE — Progress Notes (Signed)
Patient Demographics:    Alyssa Carey, is a 50 y.o. female, DOB - 04-Oct-1970, MVH:[id number]  Admit date - 04/09/2021   Admitting Physician Demetri Kerman Denton Brick, MD  Outpatient Primary MD for the patient is [facility]  LOS - 0   Chief Complaint  Patient presents with   Colostomy Stoma BLeeding        Subjective:    Alyssa Carey today has no fevers, no emesis,  No chest pain,   Had another episode of bleeding from ostomy orifice   Assessment  & Plan :    Principal Problem:   Acute GI bleeding Active Problems:   Symptomatic anemia due to Gi bleed   Alcoholic liver disease (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 female with past medical history relevant for  HTN, necrotizing fasciitis requiring colostomy/ileostomy with large parastomal hernia and ongoing alcohol abuse with alcoholic liver disease and PUD who was admitted on 04/09/21 with fatigue, generalized weakness and Bleeding from her colostomy- -  discussed with on-call gastroenterologist Dr. Gala Romney, d/w Radiologist Dr.  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 Abbey Chatters dated 02/22/2021 noted with finding of friable mucosa -Please see full GI consult notes from Dr. Gala Romney dated  04/10/2021  2)Bleeding from colostomy- -  discussed with on-call gastroenterologist Dr. Gala Romney, d/w Radiologist Dr.  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  5)PUD/alcoholic gastritis----EGD June 2022: with Gastritis. Protonix as ordered  6)H/o necrotizing fasciitis necessitating diverting colostomy in June 2021,with a large parastomal hernia --- supportive care  7)Alcoholic Liver Disease- AST 81, ALT 26--ratio is consistent with alcoholic liver disease -T bili 6.2, alk phos 147 -Avoid hepatotoxic agent  Disposition/Need for in-Hospital Stay- patient unable to be discharged at this time due to --- acute on chronic symptomatic anemia due to GI blood loss requiring transfusion and further stabilization*  Dispo: The patient is from: Home              Anticipated d/c is to: Home              Anticipated d/c date is: 2 days  Patient currently is not medically stable to d/c. Barriers: Not Clinically Stable-   Status is: Inpatient  Remains inpatient appropriate because: See disposition above  Code Status :  -  Code Status: Full Code   Family Communication:    NA (patient is alert, awake and coherent)   Consults  :  Gen surg/Gi  DVT Prophylaxis  :   - SCDs   SCDs Start: 04/09/21 1358 Place TED hose Start: 04/09/21 1358  Lab Results  Component Value Date   PLT 207 04/10/2021    Inpatient  Medications  Scheduled Meds:  vitamin C  500 mg Oral Daily   cholecalciferol  1,000 Units Oral Daily   diazepam  2 mg Oral TID   feeding supplement  237 mL Oral QID   folic acid  1 mg Oral Daily   furosemide  20 mg Intravenous Once   influenza vac split quadrivalent PF  0.5 mL Intramuscular Tomorrow-1000   multivitamin with minerals  1 tablet Oral Daily   sodium chloride flush  3 mL Intravenous Q12H   sodium chloride flush  3 mL Intravenous Q12H   thiamine  100 mg Oral Daily   Or   thiamine  100 mg Intravenous Daily   Continuous Infusions:  sodium chloride     famotidine (PEPCID) IV Stopped (04/09/21 1555)   PRN Meds:.sodium chloride, acetaminophen **OR** acetaminophen, bisacodyl, LORazepam **OR** LORazepam, polyethylene glycol, sodium chloride flush, traZODone    Anti-infectives (From admission, onward)    None         Objective:   Vitals:   04/10/21 1000 04/10/21 1022 04/10/21 1023 04/10/21 1155  BP: (!) 85/51 (!) 85/53 (!) 85/53 (!) 84/52  Pulse: 94 90 90 93  Resp: 16 16 16    Temp: 98.1 F (36.7 C) 98 F (36.7 C) 98 F (36.7 C)   TempSrc: Oral  Oral   SpO2: 100%  99% 98%  Weight:      Height:        Wt Readings from Last 3 Encounters:  04/09/21 72 kg  03/25/21 71.5 kg  02/22/21 67.6 kg     Intake/Output Summary (Last 24 hours) at 04/10/2021 1327 Last data filed at 04/10/2021 1007 Gross per 24 hour  Intake 2106.94 ml  Output --  Net 2106.94 ml    Physical Exam  Physical Examination: General appearance - alert,  and in no distress  Mental status - alert, oriented to person, place, and time,  Eyes - sclera is icteric Neck - supple, no JVD elevation , Chest - clear  to auscultation bilaterally, symmetrical air movement,  Heart - S1 and S2 normal, regular  Abdomen - soft, ostomy with parastomal hernia , mild epigastric discomfort without rebound, bowel sounds are present neurological - screening mental status exam normal, neck supple without  rigidity, cranial nerves II through XII intact, DTR's normal and symmetric Extremities - 1 +ve  pedal edema noted, intact peripheral pulses  Skin - warm, dry   Data Review:   Micro Results Recent Results (from the past 240 hour(s))  Resp Panel by RT-PCR (Flu A&B, Covid) Nasopharyngeal Swab     Status: None   Collection Time: 04/09/21  8:16 AM   Specimen: Nasopharyngeal Swab; Nasopharyngeal(NP) swabs in vial transport medium  Result Value Ref Range Status   SARS Coronavirus 2 by RT PCR NEGATIVE NEGATIVE Final    Comment: (NOTE) SARS-CoV-2 target nucleic acids are NOT DETECTED.  The SARS-CoV-2 RNA is generally detectable in upper respiratory specimens during  the acute phase of infection. The lowest concentration of SARS-CoV-2 viral copies this assay can detect is 138 copies/mL. A negative result does not preclude SARS-Cov-2 infection and should not be used as the sole basis for treatment or other patient management decisions. A negative result may occur with  improper specimen collection/handling, submission of specimen other than nasopharyngeal swab, presence of viral mutation(s) within the areas targeted by this assay, and inadequate number of viral copies(<138 copies/mL). A negative result must be combined with clinical observations, patient history, and epidemiological information. The expected result is Negative.  Fact Sheet for Patients:  EntrepreneurPulse.com.au  Fact Sheet for Healthcare Providers:  IncredibleEmployment.be  This test is no t yet approved or cleared by the [place] FDA and  has been authorized for detection and/or diagnosis of SARS-CoV-2 by FDA under an Emergency Use Authorization (EUA). This EUA will remain  in effect (meaning this test can be used) for the duration of the COVID-19 declaration under Section 564(b)(1) of the Act, 21 U.S.C.section 360bbb-3(b)(1), unless the authorization is terminated  or revoked  sooner.       Influenza A by PCR NEGATIVE NEGATIVE Final   Influenza B by PCR NEGATIVE NEGATIVE Final    Comment: (NOTE) The Xpert Xpress SARS-CoV-2/FLU/RSV plus assay is intended as an aid in the diagnosis of influenza from Nasopharyngeal swab specimens and should not be used as a sole basis for treatment. Nasal washings and aspirates are unacceptable for Xpert Xpress SARS-CoV-2/FLU/RSV testing.  Fact Sheet for Patients: EntrepreneurPulse.com.au  Fact Sheet for Healthcare Providers: IncredibleEmployment.be  This test is not yet approved or cleared by the [place] FDA and has been authorized for detection and/or diagnosis of SARS-CoV-2 by FDA under an Emergency Use Authorization (EUA). This EUA will remain in effect (meaning this test can be used) for the duration of the COVID-19 declaration under Section 564(b)(1) of the Act, 21 U.S.C. section 360bbb-3(b)(1), unless the authorization is terminated or revoked.  Performed at [facility], [address], [place], [place] [address]     Radiology Reports CT ABDOMEN PELVIS W CONTRAST  Addendum Date: 04/10/2021   ADDENDUM REPORT: 04/10/2021 12:43 ADDENDUM: Initially I spoke with Dr. Gala Romney and stated that that there did not appear to be any definite evidence of bowel obstruction or ischemic bowel involving the bowel loops present within the peristomal hernia. There is noted a small amount of fluid in the dependent portion which is adjacent to nondilated small bowel loops. However, upon further more in-depth review of these images there is noted a collection of air that can not be definitively identified as air-filled bowel, and therefore the possibility of this representing intramural gas in ischemic bowel can not be excluded, although felt to be less likely. It is recommended that repeat CT scan be performed for further evaluation. These results were called by telephone at the time of interpretation  on 04/10/2021 at 12:38 pm to provider Dr. Joesph Fillers, who verbally acknowledged these results. Electronically Signed   By: Marijo Conception M.D.   On: 04/10/2021 12:43   Result Date: 04/10/2021 CLINICAL DATA:  Acute generalized abdominal pain. EXAM: CT ABDOMEN AND PELVIS WITH CONTRAST TECHNIQUE: Multidetector CT imaging of the abdomen and pelvis was performed using the standard protocol following bolus administration of intravenous contrast. CONTRAST:  123m OMNIPAQUE IOHEXOL 300 MG/ML  SOLN COMPARISON:  March 23, 2021. FINDINGS: Lower chest: No acute abnormality. Hepatobiliary: Minimal cholelithiasis is noted. No biliary dilatation is noted. Hepatic steatosis is noted. Pancreas: Unremarkable. No  pancreatic ductal dilatation or surrounding inflammatory changes. Spleen: Normal in size without focal abnormality. Adrenals/Urinary Tract: Adrenal glands are unremarkable. Kidneys are normal, without renal calculi, focal lesion, or hydronephrosis. Bladder is unremarkable. Stomach/Bowel: Stomach appears normal. Colostomy is noted in left lower quadrant with large peristomal hernia which contains loops of nondilated large and small bowel. There is no evidence of bowel obstruction or acute inflammation. Vascular/Lymphatic: No significant vascular findings are present. No enlarged abdominal or pelvic lymph nodes. Reproductive: Uterus and bilateral adnexa are unremarkable. Other: No abdominal wall hernia or abnormality. No abdominopelvic ascites. Musculoskeletal: No acute or significant osseous findings. IMPRESSION: Hepatic steatosis. Minimal cholelithiasis. Colostomy is again noted in the left lower quadrant with large peristomal hernia which contains loops of nondilated large and small bowel. Electronically Signed: By: Marijo Conception M.D. On: 04/09/2021 09:46   CT Abdomen Pelvis W Contrast  Result Date: 03/23/2021 CLINICAL DATA:  Acute hepatitis. EXAM: CT ABDOMEN AND PELVIS WITH CONTRAST TECHNIQUE: Multidetector CT  imaging of the abdomen and pelvis was performed using the standard protocol following bolus administration of intravenous contrast. CONTRAST:  5m OMNIPAQUE IOHEXOL 350 MG/ML SOLN COMPARISON:  CT abdomen and pelvis 01/27/2021. FINDINGS: Lower chest: No acute abnormality. Hepatobiliary: The liver is markedly enlarged, similar to the prior study. There is marked diffuse fatty infiltration of the liver, also unchanged. No focal liver lesions are identified. Gallstones are present. There is no biliary ductal dilatation. Pancreas: Unremarkable. No pancreatic ductal dilatation or surrounding inflammatory changes. Spleen: Normal in size without focal abnormality. Adrenals/Urinary Tract: The kidneys and adrenal glands are within normal limits. The bladder is decompressed. No bladder calculi are seen. Stomach/Bowel: Stomach is within normal limits. Appendix is not seen. No evidence of bowel wall thickening, distention, or inflammatory changes. Left-sided ileostomy is again noted with large peristomal hernia. Vascular/Lymphatic: No significant vascular findings are present. No enlarged abdominal or pelvic lymph nodes. Reproductive: Uterus and bilateral adnexa are unremarkable. Other: Again seen is a large left-sided parastomal hernia containing nondilated colon and small bowel. Wall defect measures 6.0 by 4.2 cm similar to the prior examination. This is similar to prior. There is some mild diffuse mesenteric edema. There is no ascites or free air. There is also small fat containing umbilical hernia. Musculoskeletal: No acute or significant osseous findings. IMPRESSION: 1. Again seen is a left lower quadrant ileostomy with large parastomal hernia containing nondilated bowel loops. There is no evidence for bowel obstruction. There is mild nonspecific mesenteric edema diffusely. 2. Hepatomegaly and hepatic steatosis changes are similar to the prior study. 3. Cholelithiasis. Electronically Signed   By: ARonney AstersM.D.   On:  03/23/2021 21:51     CBC Recent Labs  Lab 04/09/21 0713 04/09/21 1600 04/10/21 0509  WBC 8.2 10.4 9.6  HGB 7.2* 10.3* 7.7*  HCT 22.2* 30.9* 23.1*  PLT 201 245 207  MCV 103.3* 99.0 98.7  MCH 33.5 33.0 32.9  MCHC 32.4 33.3 33.3  RDW 16.3* 17.0* 17.5*    Chemistries  Recent Labs  Lab 04/09/21 0713 04/09/21 0844 04/10/21 0509  NA 134*  --  133*  K 2.3*  --  3.3*  CL 97*  --  96*  CO2 26  --  29  GLUCOSE 83  --  129*  BUN 5*  --  <5*  CREATININE 0.42*  --  0.41*  CALCIUM 7.2*  --  7.9*  MG  --  1.4* 1.8  AST 81*  --  86*  ALT 26  --  29  ALKPHOS 147*  --  140*  BILITOT 6.2*  --  5.7*   ------------------------------------------------------------------------------------------------------------------ No results for input(s): CHOL, HDL, LDLCALC, TRIG, CHOLHDL, LDLDIRECT in the last 72 hours.  Lab Results  Component Value Date   HGBA1C 4.5 (L) 03/25/2021   ------------------------------------------------------------------------------------------------------------------ No results for input(s): TSH, T4TOTAL, T3FREE, THYROIDAB in the last 72 hours.  Invalid input(s): FREET3 ------------------------------------------------------------------------------------------------------------------ No results for input(s): VITAMINB12, FOLATE, FERRITIN, TIBC, IRON, RETICCTPCT in the last 72 hours.  Coagulation profile Recent Labs  Lab 04/09/21 0713  INR 1.2    No results for input(s): DDIMER in the last 72 hours.  Cardiac Enzymes No results for input(s): CKMB, TROPONINI, MYOGLOBIN in the last 168 hours.  Invalid input(s): CK ------------------------------------------------------------------------------------------------------------------ No results found for: BNP   Roxan Hockey M.D on 04/10/2021 at 1:27 PM  Go to [email or web address] - for contact info  [facility] - Office  [phone]

## 2021-04-11 ENCOUNTER — Encounter (HOSPITAL_COMMUNITY): Payer: Self-pay | Admitting: Family Medicine

## 2021-04-11 DIAGNOSIS — K435 Parastomal hernia without obstruction or  gangrene: Secondary | ICD-10-CM

## 2021-04-11 LAB — CBC
HCT: 23.3 % — ABNORMAL LOW (ref 36.0–46.0)
Hemoglobin: 7.5 g/dL — ABNORMAL LOW (ref 12.0–15.0)
MCH: 31.3 pg (ref 26.0–34.0)
MCHC: 32.2 g/dL (ref 30.0–36.0)
MCV: 97.1 fL (ref 80.0–100.0)
Platelets: 173 10*3/uL (ref 150–400)
RBC: 2.4 MIL/uL — ABNORMAL LOW (ref 3.87–5.11)
RDW: 19.5 % — ABNORMAL HIGH (ref 11.5–15.5)
WBC: 7.5 10*3/uL (ref 4.0–10.5)
nRBC: 0 % (ref 0.0–0.2)

## 2021-04-11 LAB — COMPREHENSIVE METABOLIC PANEL
ALT: 24 U/L (ref 0–44)
AST: 63 U/L — ABNORMAL HIGH (ref 15–41)
Albumin: 1.6 g/dL — ABNORMAL LOW (ref 3.5–5.0)
Alkaline Phosphatase: 129 U/L — ABNORMAL HIGH (ref 38–126)
Anion gap: 7 (ref 5–15)
BUN: <5 mg/dL — ABNORMAL LOW (ref 6–20)
CO2: 31 mmol/L (ref 22–32)
Calcium: 7.9 mg/dL — ABNORMAL LOW (ref 8.9–10.3)
Chloride: 99 mmol/L (ref 98–111)
Creatinine, Ser: 0.4 mg/dL — ABNORMAL LOW (ref 0.44–1.00)
GFR, Estimated: >60 mL/min (ref >60)
Glucose, Bld: 117 mg/dL — ABNORMAL HIGH (ref 70–99)
Potassium: 3.5 mmol/L (ref 3.5–5.1)
Sodium: 137 mmol/L (ref 135–145)
Total Bilirubin: 4.2 mg/dL — ABNORMAL HIGH (ref 0.3–1.2)
Total Protein: 5.2 g/dL — ABNORMAL LOW (ref 6.5–8.1)

## 2021-04-11 LAB — PREPARE RBC (CROSSMATCH)

## 2021-04-11 LAB — BPAM RBC
Blood Product Expiration Date: 202210272359
Blood Product Expiration Date: 202210272359
ISSUE DATE / TIME: 202209241043
ISSUE DATE / TIME: 202209250958
Unit Type and Rh: 6200
Unit Type and Rh: 6200

## 2021-04-11 LAB — APTT: aPTT: 32 seconds (ref 24–36)

## 2021-04-11 LAB — TYPE AND SCREEN
ABO/RH(D): A POS
Antibody Screen: NEGATIVE
Unit division: 0
Unit division: 0

## 2021-04-11 LAB — PROTIME-INR
INR: 1.2 (ref 0.8–1.2)
Prothrombin Time: 14.8 seconds (ref 11.4–15.2)

## 2021-04-11 LAB — MAGNESIUM: Magnesium: 1.7 mg/dL (ref 1.7–2.4)

## 2021-04-11 NOTE — Progress Notes (Signed)
Patient ID: Alyssa Carey, female   DOB: 09-25-1970, 50 y.o.   MRN: [id number]  PROGRESS NOTE    Alyssa Carey  TFT:[id number] DOB: [birth year] DOA: 04/09/2021 PCP: [facility]   Brief Narrative:  50 year old female with history of hypertension, necrotizing fasciitis requiring colostomy/ileostomy with large parastomal hernia, ongoing alcohol abuse with alcoholic liver disease and peptic ulcer disease was admitted on 04/09/2021 with fatigue, generalized weakness and bleeding from her colostomy.  She was initially admitted to [facility]: After discussion between hospitalist, on-call GI, radiologist and general surgery/Dr. Barry Dienes, there was a concern for intermittent bowel ischemia; she was transferred to [facility] for further general surgery/GI evaluation.  Assessment & Plan:   Acute on chronic symptomatic anemia -Status post 2 units packed red cells transfusion during this hospitalization. -Hemoglobin 7.5 this morning. -Patient transferred to [facility] long hospital as per recommendations from GI at [facility] and after discussion with Dr. Barry Dienes. -Monitor H&H.  Follow further recommendations from GI here.  GI bleeding/bleeding per colostomy --Had colonoscopy by Dr. Abbey Chatters on 02/22/2021 which showed friable mucosa. -Follow further GI recommendations from here.  History of necrotizing fasciitis needing diverting loop colostomy with resulting parastomal hernia -Patient was transferred to [facility] because of concern for ischemic bowel.  General surgery has been consulted.  Follow recommendations.  Alcohol abuse/alcohol intoxication -Continue CIWA protocol along with thiamine and folic acid  Hypokalemia -Resolved  Hypomagnesemia Resolved  Elevated LFTs -Possibly from alcohol abuse.  Monitor  Hypoalbuminemia -Possibly from alcoholic liver disease and poor oral intake.  Will need nutrition consult once started on a diet.  GERD -Currently  on Pepcid  DVT prophylaxis: SCDs Code Status: Full Family Communication: None at bedside Disposition Plan: Status is: Inpatient  Remains inpatient appropriate because:Inpatient level of care appropriate due to severity of illness  Dispo: The patient is from: Home              Anticipated d/c is to: Home              Patient currently is not medically stable to d/c.   Difficult to place patient No  Consultants: General surgery/GI  Procedures: None  Antimicrobials: None   Subjective: Patient seen and examined at bedside.  Denies worsening abdominal pain, nausea or vomiting.  Objective: Vitals:   04/10/21 1621 04/10/21 1926 04/10/21 2249 04/11/21 0340  BP: 97/64 104/63 106/77 98/62  Pulse: 96 98  95  Resp: 18 20 20    Temp: 98.2 F (36.8 C) 98.7 F (37.1 C) 98.6 F (37 C) 98.9 F (37.2 C)  TempSrc:  Oral Oral Oral  SpO2: 100% 100% 100% 100%  Weight:      Height:        Intake/Output Summary (Last 24 hours) at 04/11/2021 1129 Last data filed at 04/10/2021 1700 Gross per 24 hour  Intake 365 ml  Output --  Net 365 ml   Filed Weights   04/09/21 0513  Weight: 72 kg    Examination:  General exam: Appears calm and comfortable.  Currently on room air. Eyes: Icterus present Respiratory system: Bilateral decreased breath sounds at bases Cardiovascular system: S1 & S2 heard, Rate controlled Gastrointestinal system: Abdomen is distended, mildly tender around ostomy site with parastomal hernia present, soft and nontender. Normal bowel sounds heard. Extremities: No cyanosis, clubbing; bilateral lower extremity edema present  Central nervous system: Alert and oriented. No focal neurological deficits. Moving extremities Skin: No rashes, lesions or ulcers Psychiatry: Affect is  mostly flat   Data Reviewed: I have personally reviewed following labs and imaging studies  CBC: Recent Labs  Lab 04/09/21 0713 04/09/21 1600 04/10/21 0509 04/11/21 0433  WBC 8.2 10.4 9.6  7.5  HGB 7.2* 10.3* 7.7* 7.5*  HCT 22.2* 30.9* 23.1* 23.3*  MCV 103.3* 99.0 98.7 97.1  PLT 201 245 207 597   Basic Metabolic Panel: Recent Labs  Lab 04/09/21 0713 04/09/21 0844 04/10/21 0509 04/11/21 0433  NA 134*  --  133* 137  K 2.3*  --  3.3* 3.5  CL 97*  --  96* 99  CO2 26  --  29 31  GLUCOSE 83  --  129* 117*  BUN 5*  --  <5* <5*  CREATININE 0.42*  --  0.41* 0.40*  CALCIUM 7.2*  --  7.9* 7.9*  MG  --  1.4* 1.8 1.7   GFR: Estimated Creatinine Clearance: 70.6 mL/min (A) (by C-G formula based on SCr of 0.4 mg/dL (L)). Liver Function Tests: Recent Labs  Lab 04/09/21 0713 04/10/21 0509 04/11/21 0433  AST 81* 86* 63*  ALT 26 29 24   ALKPHOS 147* 140* 129*  BILITOT 6.2* 5.7* 4.2*  PROT 5.4* 5.6* 5.2*  ALBUMIN 1.6* 1.6* 1.6*   Recent Labs  Lab 04/09/21 0713  LIPASE 45   No results for input(s): AMMONIA in the last 168 hours. Coagulation Profile: Recent Labs  Lab 04/09/21 0713 04/11/21 0433  INR 1.2 1.2   Cardiac Enzymes: No results for input(s): CKTOTAL, CKMB, CKMBINDEX, TROPONINI in the last 168 hours. BNP (last 3 results) No results for input(s): PROBNP in the last 8760 hours. HbA1C: No results for input(s): HGBA1C in the last 72 hours. CBG: No results for input(s): GLUCAP in the last 168 hours. Lipid Profile: No results for input(s): CHOL, HDL, LDLCALC, TRIG, CHOLHDL, LDLDIRECT in the last 72 hours. Thyroid Function Tests: No results for input(s): TSH, T4TOTAL, FREET4, T3FREE, THYROIDAB in the last 72 hours. Anemia Panel: No results for input(s): VITAMINB12, FOLATE, FERRITIN, TIBC, IRON, RETICCTPCT in the last 72 hours. Sepsis Labs: No results for input(s): PROCALCITON, LATICACIDVEN in the last 168 hours.  Recent Results (from the past 240 hour(s))  Resp Panel by RT-PCR (Flu A&B, Covid) Nasopharyngeal Swab     Status: None   Collection Time: 04/09/21  8:16 AM   Specimen: Nasopharyngeal Swab; Nasopharyngeal(NP) swabs in vial transport medium   Result Value Ref Range Status   SARS Coronavirus 2 by RT PCR NEGATIVE NEGATIVE Final    Comment: (NOTE) SARS-CoV-2 target nucleic acids are NOT DETECTED.  The SARS-CoV-2 RNA is generally detectable in upper respiratory specimens during the acute phase of infection. The lowest concentration of SARS-CoV-2 viral copies this assay can detect is 138 copies/mL. A negative result does not preclude SARS-Cov-2 infection and should not be used as the sole basis for treatment or other patient management decisions. A negative result may occur with  improper specimen collection/handling, submission of specimen other than nasopharyngeal swab, presence of viral mutation(s) within the areas targeted by this assay, and inadequate number of viral copies(<138 copies/mL). A negative result must be combined with clinical observations, patient history, and epidemiological information. The expected result is Negative.  Fact Sheet for Patients:  EntrepreneurPulse.com.au  Fact Sheet for Healthcare Providers:  IncredibleEmployment.be  This test is no t yet approved or cleared by the [place] FDA and  has been authorized for detection and/or diagnosis of SARS-CoV-2 by FDA under an Emergency Use Authorization (EUA). This EUA will remain  in effect (meaning this test can be used) for the duration of the COVID-19 declaration under Section 564(b)(1) of the Act, 21 U.S.C.section 360bbb-3(b)(1), unless the authorization is terminated  or revoked sooner.       Influenza A by PCR NEGATIVE NEGATIVE Final   Influenza B by PCR NEGATIVE NEGATIVE Final    Comment: (NOTE) The Xpert Xpress SARS-CoV-2/FLU/RSV plus assay is intended as an aid in the diagnosis of influenza from Nasopharyngeal swab specimens and should not be used as a sole basis for treatment. Nasal washings and aspirates are unacceptable for Xpert Xpress SARS-CoV-2/FLU/RSV testing.  Fact Sheet for  Patients: EntrepreneurPulse.com.au  Fact Sheet for Healthcare Providers: IncredibleEmployment.be  This test is not yet approved or cleared by the [place] FDA and has been authorized for detection and/or diagnosis of SARS-CoV-2 by FDA under an Emergency Use Authorization (EUA). This EUA will remain in effect (meaning this test can be used) for the duration of the COVID-19 declaration under Section 564(b)(1) of the Act, 21 U.S.C. section 360bbb-3(b)(1), unless the authorization is terminated or revoked.  Performed at [facility], [address], [place], [place] [address]          Radiology Studies: No results found.      Scheduled Meds:  vitamin C  500 mg Oral Daily   cholecalciferol  1,000 Units Oral Daily   diazepam  2 mg Oral Once   feeding supplement  237 mL Oral QID   folic acid  1 mg Oral Daily   multivitamin with minerals  1 tablet Oral Daily   sodium chloride flush  3 mL Intravenous Q12H   sodium chloride flush  3 mL Intravenous Q12H   thiamine  100 mg Oral Daily   Or   thiamine  100 mg Intravenous Daily   Continuous Infusions:  sodium chloride     famotidine (PEPCID) IV 20 mg (04/11/21 0914)          Aline August, MD [facility] 04/11/2021, 11:29 AM

## 2021-04-11 NOTE — Plan of Care (Signed)
Prior notes reviewed.  Patient seen by several gastroenterology in [place] and has had recent colonoscopy.  There is concern for intermittent ischemic within parastomal hernia.  Patient was specifically transferred here for surgical evaluation for possible ostomy takedown and parastomal hernia repair.  Awaiting surgical input; pending their input, no further GI recommendations at this time and we will follow along at a distance.

## 2021-04-11 NOTE — Consult Note (Signed)
Alyssa Carey [birth year]  [id number].    Requesting MD: Dr. 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  The patient has seen Dr. Marcello Moores in our office to discuss reversal but per the patient today was told her diet was not good enough (suspect albumin and protein too low) at the time for reversal.  It appears she has had multiple episodes of bleeding through her colostomy.  Earlier this year she had enterotoxigenic E. Coli with pancolitis as the etiology of blood per ostomy.  This was treated.  She also underwent EDG and colonoscopies since that time which revealed rectal tissue friability and ulceration along with proximal ulcerations with subsequent healing on last c-scope about a month ago.  She was just discharged about 2 weeks ago secondary to alcoholic hepatitis.  The patient tells me she hasn't drank since Labor day; however, her ETOH 2 weeks ago was 204 on admission.  The patient states this past Friday she started having "shooting" blood from her colostomy but by the time EMS got to her house it had stopped.  It started again on Saturday night and she called EMS again was admitted to [facility] where she was transfused several units of pRBCs.  She states she has had no further bleeding since that time.  She has been taking some liquids well she says.  She denies any abdominal pain, nausea, or vomiting.  She had a CT scan that revealed a colostomy with a large parastomal hernia with loops of nondilated large and small bowel.  An addendum was made by radiology after talking to the GI MD who stated there was one area of air collection that could be air-filled bowel but he couldn't rule out intramural gas in the  setting of ischemic bowel but this was felt to be less likely.  GI felt the patient may be having some intermittent ischemic bowel secondary to her hernia and requested transfer down here for surgical evaluation.  The patient tells me today that she is hungry and wants to eat.  She denies any abdominal pain with this episode.  She states her bleeding was a "shooting" type bleeding and has not had any further blood in her colostomy since Saturday.  Her WBC is normal and she is AF.  We have been asked to see her for further recommendations.  ROS: ROS: Please see HPI, otherwise all other systems have been reviewed and are negative except for BLE edema.  Family History  Problem Relation Age of Onset   Colon polyps Mother        does not believe adenomas   Colon cancer Neg Hx     Past Medical History:  Diagnosis Date   HTN (hypertension) 10/05/2020   Necrotizing fasciitis [facility])     Past Surgical History:  Procedure Laterality Date   BIOPSY  10/07/2020   Procedure: BIOPSY;  Surgeon: Daneil Dolin, MD;  [facility];  Service: Endoscopy;;   COLONOSCOPY WITH PROPOFOL N/A 10/07/2020   single healing rectal ulcer in distal rectum with surrounding mucosal friability and markedly abnormal proximal colon with ulceration s/p biopsies.   COLONOSCOPY WITH PROPOFOL  02/22/2021   Procedure: COLONOSCOPY WITH PROPOFOL;  Surgeon: Abbey Chatters,  Elon Alas, DO;  Location: [facility];  Service: Endoscopy;;   ESOPHAGOGASTRODUODENOSCOPY (EGD) WITH PROPOFOL N/A 10/07/2020   non-bleeding gastric ulcer . Pathology with H.pylori negative   ESOPHAGOGASTRODUODENOSCOPY (EGD) WITH PROPOFOL N/A 12/27/2020   Procedure: ESOPHAGOGASTRODUODENOSCOPY (EGD) WITH PROPOFOL;  Surgeon: [name], DO;  Location: [facility];  Service: Endoscopy;  Laterality: N/A;  1:00pm   FLEXIBLE SIGMOIDOSCOPY N/A 01/11/2020   Procedure: FLEXIBLE SIGMOIDOSCOPY;  Surgeon: Carol Ada, MD;  Location: [facility]OS[facility];  Service:  Gastroenterology;  Laterality: N/A;   INCISION AND DRAINAGE PERIRECTAL ABSCESS N/A 12/25/2019   Procedure: IRRIGATION AND DEBRIDEMENT BUTTOCKS, LAP LOOP COLOSTOMY;  Surgeon: Greer Pickerel, MD;  Location: [facility] ORS;  Service: General;  Laterality: N/A;   IRRIGATION AND DEBRIDEMENT ABSCESS N/A 12/23/2019   Procedure: EXCISION AND DEBRIDEMENT LEFT BUTTOCK AND PERINEUM;  Surgeon: Clovis Riley, MD;  Location: [facility];  Service: General;  Laterality: N/A;   LAPAROSCOPIC LOOP COLOSTOMY N/A 12/25/2019   Procedure: LAPAROSCOPIC LOOP COLOSTOMY;  Surgeon: Greer Pickerel, MD;  Location: [facility];  Service: General;  Laterality: N/A;    Social History:  reports that she has never smoked. She has never used smokeless tobacco. She reports that she does not currently use alcohol. She reports that she does not currently use drugs.  Allergies:  Allergies  Allergen Reactions   Pantoprazole     Stomach pain   Penicillins Hives    Did it involve swelling of the face/tongue/throat, SOB, or low BP? N Did it involve sudden or severe rash/hives, skin peeling, or any reaction on the inside of your mouth or nose? Y Did you need to seek medical attention at a hospital or doctor's office? N When did it last happen?  Childhood     If all above answers are "NO", may proceed with cephalosporin use.    Oxycodone Hcl Rash    Medications Prior to Admission  Medication Sig Dispense Refill   cholecalciferol (VITAMIN D3) 25 MCG (1000 UNIT) tablet Take 1,000 Units by mouth daily.     famotidine (PEPCID) 20 MG tablet Take 1 tablet (20 mg total) by mouth 2 (two) times daily. 60 tablet 0   Multiple Vitamin (MULTIVITAMIN WITH MINERALS) TABS tablet Take 1 tablet by mouth daily. 30 tablet 0   Nutritional Supplements (BOOST PO) Take 1 Bottle by mouth 4 (four) times daily.     vitamin C (ASCORBIC ACID) 500 MG tablet Take 500 mg by mouth daily.     ondansetron (ZOFRAN) 4 MG tablet Take 1 tablet (4 mg total) by mouth every 8 (eight) hours  as needed for nausea or vomiting. (Patient not taking: No sig reported) 30 tablet 1     Physical Exam: Blood pressure 98/62, pulse 95, temperature 98.9 F (37.2 C), temperature source Oral, resp. rate 20, height 4' 9"  (1.448 m), weight 72 kg, SpO2 100 %. General: pleasant, black female who is laying in bed in NAD HEENT: head is normocephalic, atraumatic.  Sclera are noninjected.  PERRL.  Ears and nose without any masses or lesions.  Mouth is pink and moist Heart: regular, rate, and rhythm.  Normal s1,s2. No obvious murmurs, gallops, or rubs noted.  Palpable radial and pedal pulses bilaterally Lungs: CTAB, no wheezes, rhonchi, or rales noted.  Respiratory effort nonlabored Abd: soft, NT, ND, +BS, no masses or organomegaly.  She has a L-sided loop colostomy with viable pink stoma.  Bag was just changed, but no blood noted in the pouch currently with the small stool  present.  Parastomal hernia noted and is very soft and squishy and nontender to palpation MS: all 4 extremities are symmetrical with no cyanosis, clubbing.  BLE edema present Skin: warm and dry with no masses, lesions, or rashes Neuro: Cranial nerves 2-12 grossly intact, sensation is normal throughout Psych: A&Ox3 with an appropriate affect.   Results for orders placed or performed during the hospital encounter of 04/09/21 (from the past 48 hour(s))  CBC     Status: Abnormal   Collection Time: 04/09/21  4:00 PM  Result Value Ref Range   WBC 10.4 4.0 - 10.5 K/uL   RBC 3.12 (L) 3.87 - 5.11 MIL/uL   Hemoglobin 10.3 (L) 12.0 - 15.0 g/dL    Comment: POST TRANSFUSION SPECIMEN   HCT 30.9 (L) 36.0 - 46.0 %   MCV 99.0 80.0 - 100.0 fL   MCH 33.0 26.0 - 34.0 pg   MCHC 33.3 30.0 - 36.0 g/dL   RDW 17.0 (H) 11.5 - 15.5 %   Platelets 245 150 - 400 K/uL   nRBC 0.0 0.0 - 0.2 %    Comment: Performed at [facility], [address] [place], [place] [address]  Magnesium     Status: None   Collection Time: 04/10/21  5:09 AM  Result Value Ref  Range   Magnesium 1.8 1.7 - 2.4 mg/dL    Comment: Performed at [facility], [address] [place], [place] [address]  Comprehensive metabolic panel     Status: Abnormal   Collection Time: 04/10/21  5:09 AM  Result Value Ref Range   Sodium 133 (L) 135 - 145 mmol/L   Potassium 3.3 (L) 3.5 - 5.1 mmol/L    Comment: DELTA CHECK NOTED   Chloride 96 (L) 98 - 111 mmol/L   CO2 29 22 - 32 mmol/L   Glucose, Bld 129 (H) 70 - 99 mg/dL    Comment: Glucose reference range applies only to samples taken after fasting for at least 8 hours.   BUN <5 (L) 6 - 20 mg/dL   Creatinine, Ser 0.41 (L) 0.44 - 1.00 mg/dL   Calcium 7.9 (L) 8.9 - 10.3 mg/dL   Total Protein 5.6 (L) 6.5 - 8.1 g/dL   Albumin 1.6 (L) 3.5 - 5.0 g/dL   AST 86 (H) 15 - 41 U/L   ALT 29 0 - 44 U/L   Alkaline Phosphatase 140 (H) 38 - 126 U/L   Total Bilirubin 5.7 (H) 0.3 - 1.2 mg/dL   GFR, Estimated >60 >60 mL/min    Comment: (NOTE) Calculated using the CKD-EPI Creatinine Equation (2021)    Anion gap 8 5 - 15    Comment: Performed at [facility], [address] [place], [place] [address]  CBC     Status: Abnormal   Collection Time: 04/10/21  5:09 AM  Result Value Ref Range   WBC 9.6 4.0 - 10.5 K/uL   RBC 2.34 (L) 3.87 - 5.11 MIL/uL   Hemoglobin 7.7 (L) 12.0 - 15.0 g/dL    Comment: REPEATED TO VERIFY DELTA CHECK NOTED    HCT 23.1 (L) 36.0 - 46.0 %   MCV 98.7 80.0 - 100.0 fL   MCH 32.9 26.0 - 34.0 pg   MCHC 33.3 30.0 - 36.0 g/dL   RDW 17.5 (H) 11.5 - 15.5 %   Platelets 207 150 - 400 K/uL   nRBC 0.0 0.0 - 0.2 %    Comment: Performed at [facility], [address], [place], [place] [address]  Prepare  RBC (crossmatch)     Status: None   Collection Time: 04/10/21  7:53 AM  Result Value Ref Range   Order Confirmation      ORDER PROCESSED BY BLOOD BANK Performed at [facility], [address] [place], [place] [address]   Type and screen [place]     Status: None   Collection Time: 04/10/21  7:19 PM   Result Value Ref Range   ABO/RH(D) A POS    Antibody Screen NEG    Sample Expiration      04/13/2021,2359 Performed at [facility], [place] [address], [place], [place] [address]   CBC     Status: Abnormal   Collection Time: 04/11/21  4:33 AM  Result Value Ref Range   WBC 7.5 4.0 - 10.5 K/uL   RBC 2.40 (L) 3.87 - 5.11 MIL/uL   Hemoglobin 7.5 (L) 12.0 - 15.0 g/dL   HCT 23.3 (L) 36.0 - 46.0 %   MCV 97.1 80.0 - 100.0 fL   MCH 31.3 26.0 - 34.0 pg   MCHC 32.2 30.0 - 36.0 g/dL   RDW 19.5 (H) 11.5 - 15.5 %   Platelets 173 150 - 400 K/uL   nRBC 0.0 0.0 - 0.2 %    Comment: Performed at [facility], [place] [address], [place], [place] [address]  Comprehensive metabolic panel     Status: Abnormal   Collection Time: 04/11/21  4:33 AM  Result Value Ref Range   Sodium 137 135 - 145 mmol/L   Potassium 3.5 3.5 - 5.1 mmol/L   Chloride 99 98 - 111 mmol/L   CO2 31 22 - 32 mmol/L   Glucose, Bld 117 (H) 70 - 99 mg/dL    Comment: Glucose reference range applies only to samples taken after fasting for at least 8 hours.   BUN <5 (L) 6 - 20 mg/dL   Creatinine, Ser 0.40 (L) 0.44 - 1.00 mg/dL   Calcium 7.9 (L) 8.9 - 10.3 mg/dL   Total Protein 5.2 (L) 6.5 - 8.1 g/dL   Albumin 1.6 (L) 3.5 - 5.0 g/dL   AST 63 (H) 15 - 41 U/L   ALT 24 0 - 44 U/L   Alkaline Phosphatase 129 (H) 38 - 126 U/L   Total Bilirubin 4.2 (H) 0.3 - 1.2 mg/dL   GFR, Estimated >60 >60 mL/min    Comment: (NOTE) Calculated using the CKD-EPI Creatinine Equation (2021)    Anion gap 7 5 - 15    Comment: Performed at [facility], [place] [address], [place], [place] [address]  Protime-INR     Status: None   Collection Time: 04/11/21  4:33 AM  Result Value Ref Range   Prothrombin Time 14.8 11.4 - 15.2 seconds   INR 1.2 0.8 - 1.2    Comment: (NOTE) INR goal varies based on device and disease states. Performed at [facility], [place] [address], [place], [place]  [address]   APTT     Status: None   Collection Time: 04/11/21  4:33 AM  Result Value Ref Range   aPTT 32 24 - 36 seconds    Comment: Performed at [facility], [place] [address] [place], [place] [address]  Magnesium     Status: None   Collection Time: 04/11/21  4:33 AM  Result Value Ref Range   Magnesium 1.7 1.7 - 2.4 mg/dL    Comment: Performed at [facility], [place] [address]  [address] [place], [place] [address]   No results found.    Assessment/Plan GI bleed in setting of diverting loop descending colostomy and parastomal hernia The patient was noted to have a GI bleed on admission.  Her CT scan has been reviewed and between her CT scan and her clinical picture, no abdominal pain, normal WBC, AF, no further bleeding, I do not believe the patient has ischemia bowel as the etiology of her initial bleed.  Typically this is a sloughing type blood and not a "shooting" type bleeding like the patient is describing.  Her hernia is soft and nonobstructed.  If the patient were to rebleed, she would need the standard GI bleed protocol of localization and determining etiology of the bleeding source.  She does have a history of ulcerations, although by last scope a month or more ago these were improving.  Given her continued ETOH abuse she could continue to have issues with her GI tract.  With that being said, based on her LFTs, albumin, etc, the patient has a Child's-Pugh score of a B, borderline C.  This puts her at at least 30% risk of abdominal peri-operative mortality.  Her labs in may showed a normal TB so this could be slightly skewed secondary to recent hepatitis (secondary to her ETOH)  In discussion with Dr. Marcello Moores this morning as well as Dr. Harlow Asa, we feel working up her GI bleed is the first priority, which doesn't appear to be directly secondary to her parastomal hernia at this time, and then one this has been resolved, she will need referral to a tertiary care facility  to evaluation of colostomy takedown and parastomal hernia repair.  From a surgical standpoint, the patient is stable and can likely have her diet advanced and see how she does.  No acute surgical plans or indications at this time.  Discussed this with primary team as well.   FEN - NPO for now, but likely to adv after seen by GI VTE - on hold due to recent bleeding ID - none needed  Cirrhosis, based on labs today ETOH abuse Acute on chronic anemia HTN Hypoalbuminemia HTN   Henreitta Cea, [facility] Surgery 04/11/2021, 9:48 AM Please see Amion for pager number during day hours 7:00am-4:30pm or 7:00am -11:30am on weekends

## 2021-04-11 NOTE — Consult Note (Signed)
[place] Nurse ostomy follow up Patient receiving care in [place]. Patient very well informed on how to perform ostomy care. Stoma type/location: LUQ colostomy Stomal assessment/size: 2 inches, flat. Large parastomal hernia present. Peristomal assessment: scattered small, superficial ulcerations around stoma due to on-going pouch leakage. Treatment options for stomal/peristomal skin: crusting with stoma powder and skin film wipes, barrier ring, convex pouch with ostomy belt Output: thin brown Ostomy pouching: 1pc. Patient uses ostomy supplies: Roselee Culver [phone]; barrier Antionette Poles [phone]; ostomy belt, Kellie Simmering [phone]. [place] AROUND STOMA WITH WATER ONLY THEN, use stoma powder and skin barrier wipes to crust around stoma.  Education provided: use of belt--which is being ordered by Unit Secretary Enrolled patient in [employer] Discharge program: Yes--previously.  Additional convex pouches also being ordered for placement into patient's room.  Patient states she will likely have the ostomy reversed very soon. [place] nurse will not follow at this time.  Please re-consult the [facility] team if needed.  Val Riles, RN, MSN, CWOCN, CNS-BC, pager [phone]

## 2021-04-12 DIAGNOSIS — Z933 Colostomy status: Secondary | ICD-10-CM

## 2021-04-12 LAB — CBC WITH DIFFERENTIAL/PLATELET
Abs Immature Granulocytes: 0.05 10*3/uL (ref 0.00–0.07)
Basophils Absolute: 0 10*3/uL (ref 0.0–0.1)
Basophils Relative: 0 %
Eosinophils Absolute: 0.3 10*3/uL (ref 0.0–0.5)
Eosinophils Relative: 4 %
HCT: 24 % — ABNORMAL LOW (ref 36.0–46.0)
Hemoglobin: 7.7 g/dL — ABNORMAL LOW (ref 12.0–15.0)
Immature Granulocytes: 1 %
Lymphocytes Relative: 26 %
Lymphs Abs: 1.9 10*3/uL (ref 0.7–4.0)
MCH: 31.4 pg (ref 26.0–34.0)
MCHC: 32.1 g/dL (ref 30.0–36.0)
MCV: 98 fL (ref 80.0–100.0)
Monocytes Absolute: 0.7 10*3/uL (ref 0.1–1.0)
Monocytes Relative: 9 %
Neutro Abs: 4.6 10*3/uL (ref 1.7–7.7)
Neutrophils Relative %: 60 %
Platelets: 168 10*3/uL (ref 150–400)
RBC: 2.45 MIL/uL — ABNORMAL LOW (ref 3.87–5.11)
RDW: 19.1 % — ABNORMAL HIGH (ref 11.5–15.5)
WBC: 7.6 10*3/uL (ref 4.0–10.5)
nRBC: 0 % (ref 0.0–0.2)

## 2021-04-12 LAB — COMPREHENSIVE METABOLIC PANEL
ALT: 22 U/L (ref 0–44)
AST: 53 U/L — ABNORMAL HIGH (ref 15–41)
Albumin: 1.6 g/dL — ABNORMAL LOW (ref 3.5–5.0)
Alkaline Phosphatase: 121 U/L (ref 38–126)
Anion gap: 6 (ref 5–15)
BUN: 5 mg/dL — ABNORMAL LOW (ref 6–20)
CO2: 31 mmol/L (ref 22–32)
Calcium: 8.3 mg/dL — ABNORMAL LOW (ref 8.9–10.3)
Chloride: 104 mmol/L (ref 98–111)
Creatinine, Ser: 0.31 mg/dL — ABNORMAL LOW (ref 0.44–1.00)
GFR, Estimated: >60 mL/min (ref >60)
Glucose, Bld: 106 mg/dL — ABNORMAL HIGH (ref 70–99)
Potassium: 3.6 mmol/L (ref 3.5–5.1)
Sodium: 141 mmol/L (ref 135–145)
Total Bilirubin: 3.9 mg/dL — ABNORMAL HIGH (ref 0.3–1.2)
Total Protein: 5.1 g/dL — ABNORMAL LOW (ref 6.5–8.1)

## 2021-04-12 LAB — MAGNESIUM: Magnesium: 1.7 mg/dL (ref 1.7–2.4)

## 2021-04-12 MED ORDER — FOLIC ACID 1 MG PO TABS: 1.0000 mg | ORAL_TABLET | Freq: Every day | ORAL | 0 refills | Status: DC

## 2021-04-12 MED ORDER — THIAMINE HCL 100 MG PO TABS: 100.0000 mg | ORAL_TABLET | Freq: Every day | ORAL | 0 refills | Status: DC

## 2021-04-12 MED ORDER — FAMOTIDINE 20 MG PO TABS
20.0000 mg | ORAL_TABLET | Freq: Two times a day (BID) | ORAL | Status: DC
  Administered 2021-04-12: 20 mg via ORAL
  Filled 2021-04-12: qty 1

## 2021-04-12 NOTE — Progress Notes (Signed)
       Subjective: No new complaints except some gas.  Eating well.  No further blood in her stools.  No abdominal pain  ROS: See above, otherwise other systems negative  Objective: Vital signs in last 24 hours: Temp:  [99 F (37.2 C)-99.4 F (37.4 C)] 99 F (37.2 C) (09/27 0445) Pulse Rate:  [83-110] 110 (09/27 0445) Resp:  [16-18] 18 (09/27 0445) BP: (96-108)/(58-81) 102/58 (09/27 0445) SpO2:  [95 %-100 %] 95 % (09/27 0445) Weight:  [83.7 kg] 83.7 kg (09/27 0445) Last BM Date: 04/10/21  Intake/Output from previous day: 09/26 0701 - 09/27 0700 In: 608 [P.O.:605; I.V.:3] Out: 150 [Stool:150] Intake/Output this shift: No intake/output data recorded.  PE: Abd: soft, parastomal hernia present and soft, nontedner, loop colostomy with brown stool and gas in bag, no blood, stoma is pink and viable  Lab Results:  Recent Labs    04/11/21 0433 04/12/21 0524  WBC 7.5 7.6  HGB 7.5* 7.7*  HCT 23.3* 24.0*  PLT 173 168   BMET Recent Labs    04/11/21 0433 04/12/21 0524  NA 137 141  K 3.5 3.6  CL 99 104  CO2 31 31  GLUCOSE 117* 106*  BUN <5* 5*  CREATININE 0.40* 0.31*  CALCIUM 7.9* 8.3*   PT/INR Recent Labs    04/11/21 0433  LABPROT 14.8  INR 1.2   CMP     Component Value Date/Time   NA 141 04/12/2021 0524   K 3.6 04/12/2021 0524   CL 104 04/12/2021 0524   CO2 31 04/12/2021 0524   GLUCOSE 106 (H) 04/12/2021 0524   BUN 5 (L) 04/12/2021 0524   CREATININE 0.31 (L) 04/12/2021 0524   CREATININE 0.47 (L) 12/02/2020 0854   CALCIUM 8.3 (L) 04/12/2021 0524   PROT 5.1 (L) 04/12/2021 0524   ALBUMIN 1.6 (L) 04/12/2021 0524   AST 53 (H) 04/12/2021 0524   ALT 22 04/12/2021 0524   ALKPHOS 121 04/12/2021 0524   BILITOT 3.9 (H) 04/12/2021 0524   GFRNONAA >60 04/12/2021 0524   GFRNONAA 118 12/02/2020 0854   GFRAA 136 12/02/2020 0854   Lipase     Component Value Date/Time   LIPASE 45 04/09/2021 0713       Studies/Results: No results  found.  Anti-infectives: Anti-infectives (From admission, onward)    None        Assessment/Plan GI bleed in setting of diverting loop descending colostomy and parastomal hernia -hgb stable today at 7.7.  no further bleeding -tolerating a solid diet with no issues -referral from our office has been sent to a tertiary care facility, so they should be contacting the patient for follow up regarding colostomy takedown and hernia repair. -d/w primary service. Patient is medically stable for DC home today   FEN - soft diet VTE - SCDs ID - none  Cirrhosis, based on labs this admission ETOH abuse Acute on chronic anemia HTN Hypoalbuminemia HTN   LOS: 2 days    Henreitta Cea , [facility] Surgery 04/12/2021, 8:50 AM Please see Amion for pager number during day hours 7:00am-4:30pm or 7:00am -11:30am on weekends

## 2021-04-12 NOTE — Discharge Summary (Addendum)
Physician Discharge Summary  Alyssa Carey OMB:[id number] DOB: 07-23-1970 DOA: 04/09/2021  PCP: [facility]  Admit date: 04/09/2021 Discharge date: 04/12/2021  Admitted From: Home Disposition: Home  Recommendations for Outpatient Follow-up:  Follow up with PCP in 1 week with repeat CBC/CMP Outpatient follow-up with general surgery and GI Follow up in ED if symptoms worsen or new appear   Home Health: No Equipment/Devices: None  Discharge Condition: Stable CODE STATUS: Full Diet recommendation: Heart healthy  Brief/Interim Summary: 50 year old female with history of hypertension, necrotizing fasciitis requiring colostomy/ileostomy with large parastomal hernia, ongoing alcohol abuse with alcoholic liver disease and peptic ulcer disease was admitted on 04/09/2021 with fatigue, generalized weakness and bleeding from her colostomy.  She was initially admitted to [facility]: After discussion between hospitalist, on-call GI, radiologist and general surgery/Dr. Barry Dienes, there was a concern for intermittent bowel ischemia; she was transferred to [facility] for further general surgery/GI evaluation.  After general surgery evaluation at [facility], it was decided that patient will need to follow-up at urgent care facility electively for colostomy takedown and hernia repair.  This is being arranged by our surgical team.  GI also recommended conservative management.  Diet was advanced and she is tolerating diet.  General surgery has cleared the patient for discharge.  She will be discharged home today with outpatient follow-up.  Discharge Diagnoses:   Acute on chronic symptomatic anemia -Status post 2 units packed red cells transfusion during this hospitalization. -Hemoglobin 7.7 this morning. -Patient transferred to [facility] long hospital as per recommendations from GI at [facility] and after discussion with Dr. Barry Dienes. -GI recommended conservative  management.  Diet was started and patient is tolerating diet.  Patient will need outpatient follow-up with GI.  GI bleeding/bleeding per colostomy --Had colonoscopy by Dr. Abbey Chatters on 02/22/2021 which showed friable mucosa. -No further bleeding through the colostomy currently.  Tolerating diet.  Continue Pepcid.  Outpatient follow-up with GI.  No need for any intervention currently as per GI.  History of necrotizing fasciitis needing diverting loop colostomy with resulting parastomal hernia -Patient was transferred to [facility] because of concern for ischemic bowel.  After general surgery evaluation at [facility], it was decided that patient will need to follow-up at urgent care facility electively for colostomy takedown and hernia repair.  This is being arranged by our surgical team.   -General surgery has cleared the patient for discharge.  She will be discharged home today with outpatient follow-up.   Alcohol abuse/alcohol intoxication -Continue thiamine and folic acid -Counseled regarding abstinence from alcohol.  Hypokalemia -Resolved  Hypomagnesemia Resolved  Elevated LFTs -Possibly from alcohol abuse.  Outpatient follow-up.  Improving.  Hypoalbuminemia -Possibly from alcoholic liver disease and poor oral intake.  Outpatient follow-up.  GERD -Currently on Pepcid  Discharge Instructions  Discharge Instructions     Diet - low sodium heart healthy   Complete by: As directed    Increase activity slowly   Complete by: As directed    No wound care   Complete by: As directed       Allergies as of 04/12/2021       Reactions   Pantoprazole    Stomach pain   Penicillins Hives   Did it involve swelling of the face/tongue/throat, SOB, or low BP? N Did it involve sudden or severe rash/hives, skin peeling, or any reaction on the inside of your mouth or nose? Y Did you need to seek medical attention at a  hospital or doctor's office? N When did it last happen?   Childhood     If all above answers are "NO", may proceed with cephalosporin use.   Oxycodone Hcl Rash        Medication List     TAKE these medications    BOOST PO Take 1 Bottle by mouth 4 (four) times daily.   cholecalciferol 25 MCG (1000 UNIT) tablet Commonly known as: VITAMIN D3 Take 1,000 Units by mouth daily.   famotidine 20 MG tablet Commonly known as: PEPCID Take 1 tablet (20 mg total) by mouth 2 (two) times daily.   folic acid 1 MG tablet Commonly known as: FOLVITE Take 1 tablet (1 mg total) by mouth daily.   multivitamin with minerals Tabs tablet Take 1 tablet by mouth daily.   ondansetron 4 MG tablet Commonly known as: Zofran Take 1 tablet (4 mg total) by mouth every 8 (eight) hours as needed for nausea or vomiting.   thiamine 100 MG tablet Take 1 tablet (100 mg total) by mouth daily.   vitamin C 500 MG tablet Commonly known as: ASCORBIC ACID Take 500 mg by mouth daily.        Follow-up Information     [facility]. Schedule an appointment as soon as possible for a visit in 1 week(s).   Why: with repeat cbc/bmp Contact information: [place] [address] [phone]         Daneil Dolin, MD. Schedule an appointment as soon as possible for a visit in 1 week(s).   Specialty: Gastroenterology Contact information: [address] [place] [address] [phone]                Allergies  Allergen Reactions   Pantoprazole     Stomach pain   Penicillins Hives    Did it involve swelling of the face/tongue/throat, SOB, or low BP? N Did it involve sudden or severe rash/hives, skin peeling, or any reaction on the inside of your mouth or nose? Y Did you need to seek medical attention at a hospital or doctor's office? N When did it last happen?  Childhood     If all above answers are "NO", may proceed with cephalosporin use.    Oxycodone Hcl Rash    Consultations: General  surgery/GI   Procedures/Studies: CT ABDOMEN PELVIS W CONTRAST  Addendum Date: 04/10/2021   ADDENDUM REPORT: 04/10/2021 12:43 ADDENDUM: Initially I spoke with Dr. Gala Romney and stated that that there did not appear to be any definite evidence of bowel obstruction or ischemic bowel involving the bowel loops present within the peristomal hernia. There is noted a small amount of fluid in the dependent portion which is adjacent to nondilated small bowel loops. However, upon further more in-depth review of these images there is noted a collection of air that can not be definitively identified as air-filled bowel, and therefore the possibility of this representing intramural gas in ischemic bowel can not be excluded, although felt to be less likely. It is recommended that repeat CT scan be performed for further evaluation. These results were called by telephone at the time of interpretation on 04/10/2021 at 12:38 pm to provider Dr. Joesph Fillers, who verbally acknowledged these results. Electronically Signed   By: Marijo Conception M.D.   On: 04/10/2021 12:43   Result Date: 04/10/2021 CLINICAL DATA:  Acute generalized abdominal pain. EXAM: CT ABDOMEN AND PELVIS WITH CONTRAST TECHNIQUE: Multidetector CT imaging of the abdomen and pelvis was performed  using the standard protocol following bolus administration of intravenous contrast. CONTRAST:  19m OMNIPAQUE IOHEXOL 300 MG/ML  SOLN COMPARISON:  March 23, 2021. FINDINGS: Lower chest: No acute abnormality. Hepatobiliary: Minimal cholelithiasis is noted. No biliary dilatation is noted. Hepatic steatosis is noted. Pancreas: Unremarkable. No pancreatic ductal dilatation or surrounding inflammatory changes. Spleen: Normal in size without focal abnormality. Adrenals/Urinary Tract: Adrenal glands are unremarkable. Kidneys are normal, without renal calculi, focal lesion, or hydronephrosis. Bladder is unremarkable. Stomach/Bowel: Stomach appears normal. Colostomy is noted in left  lower quadrant with large peristomal hernia which contains loops of nondilated large and small bowel. There is no evidence of bowel obstruction or acute inflammation. Vascular/Lymphatic: No significant vascular findings are present. No enlarged abdominal or pelvic lymph nodes. Reproductive: Uterus and bilateral adnexa are unremarkable. Other: No abdominal wall hernia or abnormality. No abdominopelvic ascites. Musculoskeletal: No acute or significant osseous findings. IMPRESSION: Hepatic steatosis. Minimal cholelithiasis. Colostomy is again noted in the left lower quadrant with large peristomal hernia which contains loops of nondilated large and small bowel. Electronically Signed: By: JMarijo ConceptionM.D. On: 04/09/2021 09:46   CT Abdomen Pelvis W Contrast  Result Date: 03/23/2021 CLINICAL DATA:  Acute hepatitis. EXAM: CT ABDOMEN AND PELVIS WITH CONTRAST TECHNIQUE: Multidetector CT imaging of the abdomen and pelvis was performed using the standard protocol following bolus administration of intravenous contrast. CONTRAST:  845mOMNIPAQUE IOHEXOL 350 MG/ML SOLN COMPARISON:  CT abdomen and pelvis 01/27/2021. FINDINGS: Lower chest: No acute abnormality. Hepatobiliary: The liver is markedly enlarged, similar to the prior study. There is marked diffuse fatty infiltration of the liver, also unchanged. No focal liver lesions are identified. Gallstones are present. There is no biliary ductal dilatation. Pancreas: Unremarkable. No pancreatic ductal dilatation or surrounding inflammatory changes. Spleen: Normal in size without focal abnormality. Adrenals/Urinary Tract: The kidneys and adrenal glands are within normal limits. The bladder is decompressed. No bladder calculi are seen. Stomach/Bowel: Stomach is within normal limits. Appendix is not seen. No evidence of bowel wall thickening, distention, or inflammatory changes. Left-sided ileostomy is again noted with large peristomal hernia. Vascular/Lymphatic: No significant  vascular findings are present. No enlarged abdominal or pelvic lymph nodes. Reproductive: Uterus and bilateral adnexa are unremarkable. Other: Again seen is a large left-sided parastomal hernia containing nondilated colon and small bowel. Wall defect measures 6.0 by 4.2 cm similar to the prior examination. This is similar to prior. There is some mild diffuse mesenteric edema. There is no ascites or free air. There is also small fat containing umbilical hernia. Musculoskeletal: No acute or significant osseous findings. IMPRESSION: 1. Again seen is a left lower quadrant ileostomy with large parastomal hernia containing nondilated bowel loops. There is no evidence for bowel obstruction. There is mild nonspecific mesenteric edema diffusely. 2. Hepatomegaly and hepatic steatosis changes are similar to the prior study. 3. Cholelithiasis. Electronically Signed   By: AmRonney Asters.D.   On: 03/23/2021 21:51      Subjective: Patient seen and examined at bedside.  She is tolerating diet and denies any nausea or vomiting.  Feels okay to go home.  No bleeding from colostomy.  Discharge Exam: Vitals:   04/11/21 1925 04/12/21 0445  BP: 96/66 (!) 102/58  Pulse: 94 (!) 110  Resp: 16 18  Temp: 99.4 F (37.4 C) 99 F (37.2 C)  SpO2: 100% 95%    General: Pt is alert, awake, not in acute distress.  Currently on room air. Cardiovascular: rate controlled, S1/S2 + Respiratory: bilateral decreased breath  sounds at bases Abdominal: Soft, obese, slightly distended, nontender, colostomy with brown stool present along with parastomal hernia.  Bowel sounds + Extremities: no edema, no cyanosis    The results of significant diagnostics from this hospitalization (including imaging, microbiology, ancillary and laboratory) are listed below for reference.     Microbiology: Recent Results (from the past 240 hour(s))  Resp Panel by RT-PCR (Flu A&B, Covid) Nasopharyngeal Swab     Status: None   Collection Time: 04/09/21   8:16 AM   Specimen: Nasopharyngeal Swab; Nasopharyngeal(NP) swabs in vial transport medium  Result Value Ref Range Status   SARS Coronavirus 2 by RT PCR NEGATIVE NEGATIVE Final    Comment: (NOTE) SARS-CoV-2 target nucleic acids are NOT DETECTED.  The SARS-CoV-2 RNA is generally detectable in upper respiratory specimens during the acute phase of infection. The lowest concentration of SARS-CoV-2 viral copies this assay can detect is 138 copies/mL. A negative result does not preclude SARS-Cov-2 infection and should not be used as the sole basis for treatment or other patient management decisions. A negative result may occur with  improper specimen collection/handling, submission of specimen other than nasopharyngeal swab, presence of viral mutation(s) within the areas targeted by this assay, and inadequate number of viral copies(<138 copies/mL). A negative result must be combined with clinical observations, patient history, and epidemiological information. The expected result is Negative.  Fact Sheet for Patients:  EntrepreneurPulse.com.au  Fact Sheet for Healthcare Providers:  IncredibleEmployment.be  This test is no t yet approved or cleared by the [place] FDA and  has been authorized for detection and/or diagnosis of SARS-CoV-2 by FDA under an Emergency Use Authorization (EUA). This EUA will remain  in effect (meaning this test can be used) for the duration of the COVID-19 declaration under Section 564(b)(1) of the Act, 21 U.S.C.section 360bbb-3(b)(1), unless the authorization is terminated  or revoked sooner.       Influenza A by PCR NEGATIVE NEGATIVE Final   Influenza B by PCR NEGATIVE NEGATIVE Final    Comment: (NOTE) The Xpert Xpress SARS-CoV-2/FLU/RSV plus assay is intended as an aid in the diagnosis of influenza from Nasopharyngeal swab specimens and should not be used as a sole basis for treatment. Nasal washings and aspirates  are unacceptable for Xpert Xpress SARS-CoV-2/FLU/RSV testing.  Fact Sheet for Patients: EntrepreneurPulse.com.au  Fact Sheet for Healthcare Providers: IncredibleEmployment.be  This test is not yet approved or cleared by the [place] FDA and has been authorized for detection and/or diagnosis of SARS-CoV-2 by FDA under an Emergency Use Authorization (EUA). This EUA will remain in effect (meaning this test can be used) for the duration of the COVID-19 declaration under Section 564(b)(1) of the Act, 21 U.S.C. section 360bbb-3(b)(1), unless the authorization is terminated or revoked.  Performed at [facility], [address], [place],  [address]      Labs: BNP (last 3 results) No results for input(s): BNP in the last 8760 hours. Basic Metabolic Panel: Recent Labs  Lab 04/09/21 0713 04/09/21 0844 04/10/21 0509 04/11/21 0433 04/12/21 0524  NA 134*  --  133* 137 141  K 2.3*  --  3.3* 3.5 3.6  CL 97*  --  96* 99 104  CO2 26  --  29 31 31   GLUCOSE 83  --  129* 117* 106*  BUN 5*  --  <5* <5* 5*  CREATININE 0.42*  --  0.41* 0.40* 0.31*  CALCIUM 7.2*  --  7.9* 7.9* 8.3*  MG  --  1.4*  1.8 1.7 1.7   Liver Function Tests: Recent Labs  Lab 04/09/21 0713 04/10/21 0509 04/11/21 0433 04/12/21 0524  AST 81* 86* 63* 53*  ALT 26 29 24 22   ALKPHOS 147* 140* 129* 121  BILITOT 6.2* 5.7* 4.2* 3.9*  PROT 5.4* 5.6* 5.2* 5.1*  ALBUMIN 1.6* 1.6* 1.6* 1.6*   Recent Labs  Lab 04/09/21 0713  LIPASE 45   No results for input(s): AMMONIA in the last 168 hours. CBC: Recent Labs  Lab 04/09/21 0713 04/09/21 1600 04/10/21 0509 04/11/21 0433 04/12/21 0524  WBC 8.2 10.4 9.6 7.5 7.6  NEUTROABS  --   --   --   --  4.6  HGB 7.2* 10.3* 7.7* 7.5* 7.7*  HCT 22.2* 30.9* 23.1* 23.3* 24.0*  MCV 103.3* 99.0 98.7 97.1 98.0  PLT 201 245 207 173 168   Cardiac Enzymes: No results for input(s): CKTOTAL, CKMB, CKMBINDEX, TROPONINI in the last 168  hours. BNP: Invalid input(s): POCBNP CBG: No results for input(s): GLUCAP in the last 168 hours. D-Dimer No results for input(s): DDIMER in the last 72 hours. Hgb A1c No results for input(s): HGBA1C in the last 72 hours. Lipid Profile No results for input(s): CHOL, HDL, LDLCALC, TRIG, CHOLHDL, LDLDIRECT in the last 72 hours. Thyroid function studies No results for input(s): TSH, T4TOTAL, T3FREE, THYROIDAB in the last 72 hours.  Invalid input(s): FREET3 Anemia work up No results for input(s): VITAMINB12, FOLATE, FERRITIN, TIBC, IRON, RETICCTPCT in the last 72 hours. Urinalysis    Component Value Date/Time   COLORURINE BROWN (A) 03/23/2021 2000   APPEARANCEUR CLEAR 03/23/2021 2000   LABSPEC 1.025 03/23/2021 2000   PHURINE 6.5 03/23/2021 2000   GLUCOSEU 100 (A) 03/23/2021 2000   HGBUR NEGATIVE 03/23/2021 2000   BILIRUBINUR LARGE (A) 03/23/2021 2000   KETONESUR 15 (A) 03/23/2021 2000   PROTEINUR 30 (A) 03/23/2021 2000   NITRITE POSITIVE (A) 03/23/2021 2000   LEUKOCYTESUR TRACE (A) 03/23/2021 2000   Sepsis Labs Invalid input(s): PROCALCITONIN,  WBC,  LACTICIDVEN Microbiology Recent Results (from the past 240 hour(s))  Resp Panel by RT-PCR (Flu A&B, Covid) Nasopharyngeal Swab     Status: None   Collection Time: 04/09/21  8:16 AM   Specimen: Nasopharyngeal Swab; Nasopharyngeal(NP) swabs in vial transport medium  Result Value Ref Range Status   SARS Coronavirus 2 by RT PCR NEGATIVE NEGATIVE Final    Comment: (NOTE) SARS-CoV-2 target nucleic acids are NOT DETECTED.  The SARS-CoV-2 RNA is generally detectable in upper respiratory specimens during the acute phase of infection. The lowest concentration of SARS-CoV-2 viral copies this assay can detect is 138 copies/mL. A negative result does not preclude SARS-Cov-2 infection and should not be used as the sole basis for treatment or other patient management decisions. A negative result may occur with  improper specimen  collection/handling, submission of specimen other than nasopharyngeal swab, presence of viral mutation(s) within the areas targeted by this assay, and inadequate number of viral copies(<138 copies/mL). A negative result must be combined with clinical observations, patient history, and epidemiological information. The expected result is Negative.  Fact Sheet for Patients:  EntrepreneurPulse.com.au  Fact Sheet for Healthcare Providers:  IncredibleEmployment.be  This test is no t yet approved or cleared by the [place] FDA and  has been authorized for detection and/or diagnosis of SARS-CoV-2 by FDA under an Emergency Use Authorization (EUA). This EUA will remain  in effect (meaning this test can be used) for the duration of the COVID-19 declaration under Section 564(b)(1)  of the Act, 21 U.S.C.section 360bbb-3(b)(1), unless the authorization is terminated  or revoked sooner.       Influenza A by PCR NEGATIVE NEGATIVE Final   Influenza B by PCR NEGATIVE NEGATIVE Final    Comment: (NOTE) The Xpert Xpress SARS-CoV-2/FLU/RSV plus assay is intended as an aid in the diagnosis of influenza from Nasopharyngeal swab specimens and should not be used as a sole basis for treatment. Nasal washings and aspirates are unacceptable for Xpert Xpress SARS-CoV-2/FLU/RSV testing.  Fact Sheet for Patients: EntrepreneurPulse.com.au  Fact Sheet for Healthcare Providers: IncredibleEmployment.be  This test is not yet approved or cleared by the [place] FDA and has been authorized for detection and/or diagnosis of SARS-CoV-2 by FDA under an Emergency Use Authorization (EUA). This EUA will remain in effect (meaning this test can be used) for the duration of the COVID-19 declaration under Section 564(b)(1) of the Act, 21 U.S.C. section 360bbb-3(b)(1), unless the authorization is terminated or revoked.  Performed at [facility], [address], [place], [place] [address]      Time coordinating discharge: 35 minutes  SIGNED:   Aline August, MD  [facility] 04/12/2021, 10:42 AM

## 2021-04-21 ENCOUNTER — Observation Stay (HOSPITAL_COMMUNITY)
Admission: EM | Admit: 2021-04-21 | Discharge: 2021-04-23 | Disposition: A | Discharge status: Home / Self Care | Payer: Self-pay | Attending: Internal Medicine | Admitting: Internal Medicine

## 2021-04-21 ENCOUNTER — Observation Stay (HOSPITAL_COMMUNITY): Payer: Medicaid Other

## 2021-04-21 ENCOUNTER — Other Ambulatory Visit: Payer: Self-pay

## 2021-04-21 ENCOUNTER — Encounter (HOSPITAL_COMMUNITY): Payer: Self-pay | Admitting: Emergency Medicine

## 2021-04-21 DIAGNOSIS — M7989 Other specified soft tissue disorders: Principal | ICD-10-CM | POA: Insufficient documentation

## 2021-04-21 DIAGNOSIS — I10 Essential (primary) hypertension: Secondary | ICD-10-CM | POA: Insufficient documentation

## 2021-04-21 DIAGNOSIS — Y900 Blood alcohol level of less than 20 mg/100 ml: Secondary | ICD-10-CM | POA: Insufficient documentation

## 2021-04-21 DIAGNOSIS — D649 Anemia, unspecified: Secondary | ICD-10-CM

## 2021-04-21 DIAGNOSIS — E876 Hypokalemia: Secondary | ICD-10-CM | POA: Insufficient documentation

## 2021-04-21 DIAGNOSIS — Z20822 Contact with and (suspected) exposure to covid-19: Secondary | ICD-10-CM | POA: Insufficient documentation

## 2021-04-21 DIAGNOSIS — Z79899 Other long term (current) drug therapy: Secondary | ICD-10-CM | POA: Insufficient documentation

## 2021-04-21 DIAGNOSIS — K709 Alcoholic liver disease, unspecified: Secondary | ICD-10-CM

## 2021-04-21 DIAGNOSIS — R600 Localized edema: Secondary | ICD-10-CM | POA: Insufficient documentation

## 2021-04-21 DIAGNOSIS — R748 Abnormal levels of other serum enzymes: Secondary | ICD-10-CM | POA: Diagnosis present

## 2021-04-21 DIAGNOSIS — E8809 Other disorders of plasma-protein metabolism, not elsewhere classified: Secondary | ICD-10-CM | POA: Diagnosis present

## 2021-04-21 DIAGNOSIS — E46 Unspecified protein-calorie malnutrition: Secondary | ICD-10-CM | POA: Diagnosis present

## 2021-04-21 LAB — COMPREHENSIVE METABOLIC PANEL
ALT: 23 U/L (ref 0–44)
AST: 59 U/L — ABNORMAL HIGH (ref 15–41)
Albumin: 2 g/dL — ABNORMAL LOW (ref 3.5–5.0)
Alkaline Phosphatase: 150 U/L — ABNORMAL HIGH (ref 38–126)
Anion gap: 7 (ref 5–15)
BUN: <5 mg/dL — ABNORMAL LOW (ref 6–20)
CO2: 25 mmol/L (ref 22–32)
Calcium: 8.2 mg/dL — ABNORMAL LOW (ref 8.9–10.3)
Chloride: 107 mmol/L (ref 98–111)
Creatinine, Ser: 0.41 mg/dL — ABNORMAL LOW (ref 0.44–1.00)
GFR, Estimated: >60 mL/min (ref >60)
Glucose, Bld: 69 mg/dL — ABNORMAL LOW (ref 70–99)
Potassium: 2.9 mmol/L — ABNORMAL LOW (ref 3.5–5.1)
Sodium: 139 mmol/L (ref 135–145)
Total Bilirubin: 3.4 mg/dL — ABNORMAL HIGH (ref 0.3–1.2)
Total Protein: 6.6 g/dL (ref 6.5–8.1)

## 2021-04-21 LAB — CBC
HCT: 32.7 % — ABNORMAL LOW (ref 36.0–46.0)
Hemoglobin: 9.7 g/dL — ABNORMAL LOW (ref 12.0–15.0)
MCH: 31.1 pg (ref 26.0–34.0)
MCHC: 29.7 g/dL — ABNORMAL LOW (ref 30.0–36.0)
MCV: 104.8 fL — ABNORMAL HIGH (ref 80.0–100.0)
Platelets: 237 10*3/uL (ref 150–400)
RBC: 3.12 MIL/uL — ABNORMAL LOW (ref 3.87–5.11)
RDW: 17.8 % — ABNORMAL HIGH (ref 11.5–15.5)
WBC: 5 10*3/uL (ref 4.0–10.5)
nRBC: 0 % (ref 0.0–0.2)

## 2021-04-21 LAB — BRAIN NATRIURETIC PEPTIDE: B Natriuretic Peptide: 387 pg/mL — ABNORMAL HIGH (ref 0.0–100.0)

## 2021-04-21 LAB — ETHANOL: Alcohol, Ethyl (B): <10 mg/dL (ref <10)

## 2021-04-21 LAB — MAGNESIUM: Magnesium: 1.7 mg/dL (ref 1.7–2.4)

## 2021-04-21 MED ORDER — THIAMINE HCL 100 MG/ML IJ SOLN: 100.0000 mg | Freq: Every day | INTRAMUSCULAR | Status: DC

## 2021-04-21 MED ORDER — FOLIC ACID 1 MG PO TABS
1.0000 mg | ORAL_TABLET | Freq: Every day | ORAL | Status: DC
  Administered 2021-04-21 – 2021-04-23 (×3): 1 mg via ORAL
  Filled 2021-04-21 (×3): qty 1

## 2021-04-21 MED ORDER — POLYETHYLENE GLYCOL 3350 17 G PO PACK: 17.0000 g | PACK | Freq: Every day | ORAL | Status: DC | PRN

## 2021-04-21 MED ORDER — FAMOTIDINE 20 MG PO TABS
20.0000 mg | ORAL_TABLET | Freq: Two times a day (BID) | ORAL | Status: DC
  Administered 2021-04-21 – 2021-04-23 (×4): 20 mg via ORAL
  Filled 2021-04-21 (×4): qty 1

## 2021-04-21 MED ORDER — LORAZEPAM 2 MG/ML IJ SOLN: 1.0000 mg | INTRAMUSCULAR | Status: DC | PRN

## 2021-04-21 MED ORDER — THIAMINE HCL 100 MG PO TABS
100.0000 mg | ORAL_TABLET | Freq: Every day | ORAL | Status: DC
  Administered 2021-04-21 – 2021-04-23 (×3): 100 mg via ORAL
  Filled 2021-04-21 (×3): qty 1

## 2021-04-21 MED ORDER — POTASSIUM CHLORIDE CRYS ER 20 MEQ PO TBCR
40.0000 meq | EXTENDED_RELEASE_TABLET | Freq: Once | ORAL | Status: AC
  Administered 2021-04-21: 40 meq via ORAL
  Filled 2021-04-21: qty 2

## 2021-04-21 MED ORDER — LORAZEPAM 1 MG PO TABS
1.0000 mg | ORAL_TABLET | ORAL | Status: DC | PRN
  Administered 2021-04-22: 1 mg via ORAL
  Filled 2021-04-21: qty 1

## 2021-04-21 MED ORDER — ACETAMINOPHEN 650 MG RE SUPP: 650.0000 mg | Freq: Four times a day (QID) | RECTAL | Status: DC | PRN

## 2021-04-21 MED ORDER — HEPARIN SODIUM (PORCINE) 5000 UNIT/ML IJ SOLN
5000.0000 [IU] | Freq: Three times a day (TID) | INTRAMUSCULAR | Status: DC
  Administered 2021-04-22 – 2021-04-23 (×5): 5000 [IU] via SUBCUTANEOUS
  Filled 2021-04-21 (×5): qty 1

## 2021-04-21 MED ORDER — ACETAMINOPHEN 325 MG PO TABS
650.0000 mg | ORAL_TABLET | Freq: Four times a day (QID) | ORAL | Status: DC | PRN
  Administered 2021-04-22: 650 mg via ORAL
  Filled 2021-04-21: qty 2

## 2021-04-21 MED ORDER — ADULT MULTIVITAMIN W/MINERALS CH
1.0000 | ORAL_TABLET | Freq: Every day | ORAL | Status: DC
  Administered 2021-04-21 – 2021-04-23 (×3): 1 via ORAL
  Filled 2021-04-21 (×3): qty 1

## 2021-04-21 MED ORDER — FUROSEMIDE 40 MG PO TABS
20.0000 mg | ORAL_TABLET | Freq: Once | ORAL | Status: AC
  Administered 2021-04-21: 20 mg via ORAL
  Filled 2021-04-21: qty 1

## 2021-04-21 MED ORDER — FUROSEMIDE 10 MG/ML IJ SOLN
20.0000 mg | Freq: Two times a day (BID) | INTRAMUSCULAR | Status: DC
  Administered 2021-04-21 – 2021-04-23 (×4): 20 mg via INTRAVENOUS
  Filled 2021-04-21 (×4): qty 2

## 2021-04-21 MED ORDER — BOOST PO LIQD
237.0000 mL | Freq: Three times a day (TID) | ORAL | Status: DC
  Administered 2021-04-22 – 2021-04-23 (×3): 237 mL via ORAL
  Filled 2021-04-21 (×11): qty 237

## 2021-04-21 MED ORDER — POTASSIUM CHLORIDE 10 MEQ/100ML IV SOLN
10.0000 meq | Freq: Once | INTRAVENOUS | Status: AC
  Administered 2021-04-21: 10 meq via INTRAVENOUS
  Filled 2021-04-21: qty 100

## 2021-04-21 NOTE — H&P (Addendum)
History and Physical    Alyssa Carey VZD:[id number] DOB: Mar 02, 1971 DOA: 04/21/2021  PCP: [facility]   Patient coming from: Home  I have personally briefly reviewed patient's old medical records in [facility]  Chief Complaint: Leg swelling  HPI: Alyssa Carey is a 50 y.o. female with medical history significant for alcoholic liver disease, peptic ulcer disease, hepatic encephalopathy, necrotizing fasciitis with resultant ostomy. Patient presented to ED with complaints of bilateral lower extremity swelling.  Swelling started about a month ago, and progressed after she left the hospital.  She was given a dose of IV Lasix while she was in the hospital and that significantly helped with the swelling, but it has since returned.  She feels her thighs are also swollen.  No swelling to face arms.  No difficulty breathing.  No chest pain.  She has had poor oral intake that is gradually improving.  No vomiting no change in bowel habits.  Recent hospitalization 9/24-9/27-for acute on chronic symptomatic anemia requiring 2 units of PRBCs.  Hemoglobin on discharge was 7.7.  Patient has a history of necrotizing fasciitis for which she has a diverting loop colostomy with resulting parastomal hernia, she was transferred to [facility] long hospital due to concerns for ischemic bowel, after evaluation by general surgery, it was recommended that patient follow-up on discharge for elective colostomy takedown and hernia repair.  ED Course: Reported 3+ pitting edema bilaterally. Stable vitals.  Potassium 2.9.  Stable elevated AST and ALP.  Hgb 9.7.  BNP.  97.  Magnesium 1.7.  Blood glucose 69.  Albumin 2. 33m oral lasix was given.  Hospitalist admit for fluid overload, and hypokalemia.  Review of Systems: As per HPI all other systems reviewed and negative.  Past Medical History:  Diagnosis Date   HTN (hypertension) 10/05/2020   Necrotizing fasciitis ([facility]     Past Surgical History:   Procedure Laterality Date   BIOPSY  10/07/2020   Procedure: BIOPSY;  Surgeon: RDaneil Dolin MD;  [facility];  Service: Endoscopy;;   COLONOSCOPY WITH PROPOFOL N/A 10/07/2020   single healing rectal ulcer in distal rectum with surrounding mucosal friability and markedly abnormal proximal colon with ulceration s/p biopsies.   COLONOSCOPY WITH PROPOFOL  02/22/2021   Procedure: COLONOSCOPY WITH PROPOFOL;  Surgeon: CEloise Harman DO;  [facility];  Service: Endoscopy;;   ESOPHAGOGASTRODUODENOSCOPY (EGD) WITH PROPOFOL N/A 10/07/2020   non-bleeding gastric ulcer . Pathology with H.pylori negative   ESOPHAGOGASTRODUODENOSCOPY (EGD) WITH PROPOFOL N/A 12/27/2020   Procedure: ESOPHAGOGASTRODUODENOSCOPY (EGD) WITH PROPOFOL;  Surgeon: CEloise Harman DO;  [facility];  Service: Endoscopy;  Laterality: N/A;  1:00pm   FLEXIBLE SIGMOIDOSCOPY N/A 01/11/2020   Procedure: FLEXIBLE SIGMOIDOSCOPY;  Surgeon: HCarol Ada MD;  [facility];  Service: Gastroenterology;  Laterality: N/A;   INCISION AND DRAINAGE PERIRECTAL ABSCESS N/A 12/25/2019   Procedure: IRRIGATION AND DEBRIDEMENT BUTTOCKS, LAP LOOP COLOSTOMY;  Surgeon: WGreer Pickerel MD;  Location: [facility];  Service: General;  Laterality: N/A;   IRRIGATION AND DEBRIDEMENT ABSCESS N/A 12/23/2019   Procedure: EXCISION AND DEBRIDEMENT LEFT BUTTOCK AND PERINEUM;  Surgeon: CClovis Riley MD;  [facility];  Service: General;  Laterality: N/A;   LAPAROSCOPIC LOOP COLOSTOMY N/A 12/25/2019   Procedure: LAPAROSCOPIC LOOP COLOSTOMY;  Surgeon: WGreer Pickerel MD;  [facility];  Service: General;  Laterality: N/A;     reports that she has never smoked. She has never used smokeless tobacco. She reports  that she does not currently use alcohol. She reports that she does not currently use drugs.  Allergies  Allergen Reactions   Pantoprazole     Stomach pain   Penicillins Hives    Did it involve swelling of the  face/tongue/throat, SOB, or low BP? N Did it involve sudden or severe rash/hives, skin peeling, or any reaction on the inside of your mouth or nose? Y Did you need to seek medical attention at a hospital or doctor's office? N When did it last happen?  Childhood     If all above answers are "NO", may proceed with cephalosporin use.    Oxycodone Hcl Rash    Family History  Problem Relation Age of Onset   Colon polyps Mother        does not believe adenomas   Colon cancer Neg Hx     Prior to Admission medications   Medication Sig Start Date End Date Taking? Authorizing Provider  cholecalciferol (VITAMIN D3) 25 MCG (1000 UNIT) tablet Take 1,000 Units by mouth daily.    [provider]  famotidine (PEPCID) 20 MG tablet Take 1 tablet (20 mg total) by mouth 2 (two) times daily. 03/29/21 04/28/21  Barb Merino, MD  folic acid (FOLVITE) 1 MG tablet Take 1 tablet (1 mg total) by mouth daily. 04/12/21   Aline August, MD  Multiple Vitamin (MULTIVITAMIN WITH MINERALS) TABS tablet Take 1 tablet by mouth daily. 03/29/21 04/28/21  Barb Merino, MD  Nutritional Supplements (BOOST PO) Take 1 Bottle by mouth 4 (four) times daily.    [provider]  ondansetron (ZOFRAN) 4 MG tablet Take 1 tablet (4 mg total) by mouth every 8 (eight) hours as needed for nausea or vomiting. Patient not taking: No sig reported 02/22/21 04/23/21  [name], DO  thiamine 100 MG tablet Take 1 tablet (100 mg total) by mouth daily. 04/12/21   Aline August, MD  vitamin C (ASCORBIC ACID) 500 MG tablet Take 500 mg by mouth daily.    [provider]    Physical Exam: Vitals:   04/21/21 1333 04/21/21 1334  BP: 114/76   Pulse: 87   Resp: 17   Temp: 98 F (36.7 C)   TempSrc: Oral   SpO2: 100%   Weight:  70.7 kg  Height:  4' 9"  (1.448 m)    Constitutional: NAD, calm, comfortable Vitals:   04/21/21 1333 04/21/21 1334  BP: 114/76   Pulse: 87   Resp: 17   Temp: 98 F (36.7 C)    TempSrc: Oral   SpO2: 100%   Weight:  70.7 kg  Height:  4' 9"  (1.448 m)   Eyes: Mildly icteric conjunctiva, lids normal. ENMT: Mucous membranes are moist.  Neck: normal, supple, no masses, no thyromegaly Respiratory: clear to auscultation bilaterally, no wheezing, no crackles. Normal respiratory effort. No accessory muscle use.  Cardiovascular: Regular rate and rhythm, no murmurs / rubs / gallops. 2 + pitting edema to knees.  No swelling to extremities or face.  2+ pedal pulses.   Abdomen: Ostomy present, stool present in bag, no tenderness, no masses palpated. No hepatosplenomegaly. Bowel sounds positive.  Musculoskeletal: no clubbing / cyanosis. No joint deformity upper and lower extremities. Good ROM, no contractures. Normal muscle tone.  Skin: no rashes, lesions, ulcers. No induration Neurologic: No apparent cranial nerve abnormality, moving extremities spontaneously. Psychiatric: Normal judgment and insight. Alert and oriented x 3. Normal mood.   Labs on Admission: I have personally reviewed following labs and  imaging studies  CBC: Recent Labs  Lab 04/21/21 1528  WBC 5.0  HGB 9.7*  HCT 32.7*  MCV 104.8*  PLT 782   Basic Metabolic Panel: Recent Labs  Lab 04/21/21 1528 04/21/21 1534  NA 139  --   K 2.9*  --   CL 107  --   CO2 25  --   GLUCOSE 69*  --   BUN <5*  --   CREATININE 0.41*  --   CALCIUM 8.2*  --   MG  --  1.7   GFR: Estimated Creatinine Clearance: 69.8 mL/min (A) (by C-G formula based on SCr of 0.41 mg/dL (L)). Liver Function Tests: Recent Labs  Lab 04/21/21 1528  AST 59*  ALT 23  ALKPHOS 150*  BILITOT 3.4*  PROT 6.6  ALBUMIN 2.0*    Radiological Exams on Admission: No results found.  EKG: Independently reviewed.  Sinus rhythm rate 83.  QTc prolonged 581.  Nonspecific T wave changes in 2 3 aVF V4 through V6 that are unchanged from prior.  Assessment/Plan Principal Problem:   Swelling of lower extremity Active Problems:   Alcoholic liver  disease (HCC)   HTN (hypertension)   Elevated alkaline phosphatase level   Hypokalemia   Hypoalbuminemia due to protein-calorie malnutrition (HCC)   Anemia   Bilateral lower extremity swelling likely 2/2 hypoalbuminemia-blood alcohol level of 2, today, but recently usually < 1.7.  2+ pitting lower extremity edema to knees bilaterally. Port chest xray without acute abnormality. -Bilateral lower extremity venous Doppler - Obtain echocardiogram - 33m oral lasix X1 given in ED with good response, continue IV lasix 282mQ12h -Strict input output, daily weights Daily BMP  Hypokalemia potassium 2.9.  Normal magnesium 1.7. -Replete K.  Prolonged QTC 581.  Prior EKG showed QTC in the 440s and also elevated 507.  She is hypokalemic, but magnesium is normal. -Replete K.  Alcoholic Liver Disease-she is mildly icteric about total bilirubin 3.4 today, ALP and AST mildly elevated consistent with prior.  Tells me she has not had any alcoholic beverage in 6 months to a year, blood alcohol level on recent hospitalization was elevated at 84. -CIWA as needed -Thiamine folate multivitamins -Check phosphorus  Chronic anemia, hemoglobin today 9.7, discharge hemoglobin was 7.7   Hx of PUD/alcoholic gastritis----EGD June 2022: with Gastritis. - Continue Oral famotidine ( protonix allergy)   H/o necrotizing fasciitis necessitating diverting colostomy in June 2021,with a large parastomal hernia --- supportive care.  She is to follow-up with outpatient general surgery for elective colostomy takedown and hernia repair.  DVT prophylaxis: Heparin (required 2 units PRBC on recent hospitalization, but with 3 recent hospitalizations, lower extremity swelling, she is at risk for DVT) Code Status: Full code Family Communication: None at bedside Disposition Plan: ~ 2 days Consults called: None Admission status: Obs tele  EjBethena RoysD [facility]  04/21/2021, 5:56 PM

## 2021-04-21 NOTE — ED Triage Notes (Signed)
Pt c/o bilateral leg swelling since last week. And has a little pain on her ostomy site.

## 2021-04-21 NOTE — ED Notes (Signed)
Delay in obtaining IV due to pt is a difficult stick  [place] used.

## 2021-04-21 NOTE — ED Provider Notes (Signed)
[facility] EMERGENCY DEPARTMENT Provider Note   CSN: [id number] Arrival date & time: 04/21/21  1245     History Chief Complaint  Patient presents with   Leg Swelling    Alyssa Carey is a 50 y.o. female with multiple comorbidities presenting to the emergency department for evaluation of bilateral leg edema for the past "few weeks".  The patient reports she saw her PCP yesterday and was told to go to the emergency department for "a full cardiac work-up" due to the patient not having insurance.  He denies any chest pain, shortness of breath, dyspnea on exertion, nausea, vomiting, or abdominal pain.  She reports that her legs feel heavy, not really painful, just feels a lot of pressure.  She denies any skin changes or injury to the area.  Patient reports she is no longer drinking alcohol.  Additionally, she is mentioning some pains in her ostomy site.  No bleeding, surrounding redness, or purulent drainage from the area.  HPI     Past Medical History:  Diagnosis Date   HTN (hypertension) 10/05/2020   Necrotizing fasciitis [facility])     Patient Active Problem List   Diagnosis Date Noted   Swelling of lower extremity 04/21/2021   Symptomatic anemia due to Gi bleed 04/09/2021   Hypokalemia 03/24/2021   Hypoalbuminemia due to protein-calorie malnutrition ([place]) 03/24/2021   Hypoglycemia 03/24/2021   Obesity (BMI 30.0-34.9) 03/24/2021   Serum total bilirubin elevated 03/24/2021   Anemia 03/24/2021   Prolonged QT interval 03/24/2021   Acute hepatitis    Alcohol use    Abnormal liver enzymes 03/23/2021   PUD (peptic ulcer disease) 12/02/2020   Acute blood loss anemia 10/06/2020   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CC)    Elevated alkaline phosphatase level    Abdominal pain    Parastomal hernia without obstruction or gangrene    Acute GI bleeding 10/05/2020   Transaminitis 10/05/2020   HTN (hypertension) 10/05/2020   Mild protein malnutrition ([place]) 10/05/2020   Stercoral ulcer of rectum 01/12/2020    Aphonia 01/12/2020   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   Vocal fold paresis, 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   Hypernatremia 01/12/2020   Anasarca 01/12/2020   Necrotizing fasciitis of pelvic region and thigh ([place]) 12/28/2019   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   Acute respiratory failure with hypoxemia (HCC)    Alcoholic hepatitis without ascites    Pressure injury of skin 12/22/2019   Fall at home, initial encounter 12/21/2019   Weakness 12/21/2019   Alcoholism ([place]) 12/21/2019   Thrombocytopenia ([place]) 12/21/2019   ARF (acute renal failure) ([place]) 12/21/2019   Hepatic encephalopathy 96/75/9163   Alcoholic liver disease ([place]) 12/21/2019    Past Surgical History:  Procedure Laterality Date   BIOPSY  10/07/2020   Procedure: BIOPSY;  Surgeon: Daneil Dolin, MD;  [facility];  Service: Endoscopy;;   COLONOSCOPY WITH PROPOFOL N/A 10/07/2020   single healing rectal ulcer in distal rectum with surrounding mucosal friability and markedly abnormal proximal colon with ulceration s/p biopsies.   COLONOSCOPY WITH PROPOFOL  02/22/2021   Procedure: COLONOSCOPY WITH PROPOFOL;  Surgeon: [name], DO;  [facility];  Service: Endoscopy;;   ESOPHAGOGASTRODUODENOSCOPY (EGD) WITH PROPOFOL N/A 10/07/2020   non-bleeding gastric ulcer . Pathology with H.pylori negative   ESOPHAGOGASTRODUODENOSCOPY (EGD) WITH PROPOFOL N/A 12/27/2020   Procedure: ESOPHAGOGASTRODUODENOSCOPY (EGD) WITH PROPOFOL;  Surgeon: [name], DO;  [facility];  Service: Endoscopy;  Laterality: N/A;  1:00pm  FLEXIBLE SIGMOIDOSCOPY N/A 01/11/2020   Procedure: FLEXIBLE SIGMOIDOSCOPY;  Surgeon: Carol Ada, MD;  Location[facility];  Service: Gastroenterology;  Laterality: N/A;   INCISION AND DRAINAGE PERIRECTAL ABSCESS N/A 12/25/2019   Procedure: IRRIGATION AND DEBRIDEMENT BUTTOCKS, LAP LOOP COLOSTOMY;  Surgeon: Greer Pickerel, MD;  [facility];  Service: General;  Laterality:  N/A;   IRRIGATION AND DEBRIDEMENT ABSCESS N/A 12/23/2019   Procedure: EXCISION AND DEBRIDEMENT LEFT BUTTOCK AND PERINEUM;  Surgeon: Cl[place] , MD;  [facility];  Service: General;  Laterality: N/A;   LAPAROSCOPIC LOOP COLOSTOMY N/A 12/25/2019   Procedure: LAPAROSCOPIC LOOP COLOSTOMY;  Surgeon: Greer Pickerel, MD;  [facility];  Service: General;  Laterality: N/A;     OB History   No obstetric history on file.     Family History  Problem Relation Age of Onset   Colon polyps Mother        does not believe adenomas   Colon cancer Neg Hx     Social History   Tobacco Use   Smoking status: Never   Smokeless tobacco: Never  Vaping Use   Vaping Use: Never used  Substance Use Topics   Alcohol use: Not Currently   Drug use: Not Currently    Home Medications Prior to Admission medications   Medication Sig Start Date End Date Taking? Authorizing Provider  cholecalciferol (VITAMIN D3) 25 MCG (1000 UNIT) tablet Take 1,000 Units by mouth daily.    [provider]  famotidine (PEPCID) 20 MG tablet Take 1 tablet (20 mg total) by mouth 2 (two) times daily. 03/29/21 04/28/21  Barb Merino, MD  folic acid (FOLVITE) 1 MG tablet Take 1 tablet (1 mg total) by mouth daily. 04/12/21   Aline August, MD  Multiple Vitamin (MULTIVITAMIN WITH MINERALS) TABS tablet Take 1 tablet by mouth daily. 03/29/21 04/28/21  Barb Merino, MD  Nutritional Supplements (BOOST PO) Take 1 Bottle by mouth 4 (four) times daily.    [provider]  ondansetron (ZOFRAN) 4 MG tablet Take 1 tablet (4 mg total) by mouth every 8 (eight) hours as needed for nausea or vomiting. Patient not taking: No sig reported 02/22/21 04/23/21  [name], DO  thiamine 100 MG tablet Take 1 tablet (100 mg total) by mouth daily. 04/12/21   Aline August, MD  vitamin C (ASCORBIC ACID) 500 MG tablet Take 500 mg by mouth daily.    [provider]    Allergies    Pantoprazole, Penicillins, and  Oxycodone hcl  Review of Systems   Review of Systems  Constitutional:  Negative for chills and fever.  HENT:  Negative for ear pain and sore throat.   Eyes:  Negative for pain and visual disturbance.  Respiratory:  Negative for cough and shortness of breath.   Cardiovascular:  Positive for leg swelling. Negative for chest pain and palpitations.  Gastrointestinal:  Negative for abdominal pain and vomiting.  Genitourinary:  Negative for dysuria and hematuria.  Musculoskeletal:  Negative for arthralgias and back pain.  Skin:  Negative for color change and rash.  Neurological:  Negative for seizures and syncope.  All other systems reviewed and are negative.  Physical Exam Updated Vital Signs BP 114/76 (BP Location: Left Arm)   Pulse 87   Temp 98 F (36.7 C) (Oral)   Resp 17   Ht 4' 9"  (1.448 m)   Wt 70.7 kg   SpO2 100%   BMI 33.73 kg/m   Physical Exam Vitals and nursing note reviewed.  Constitutional:      General: She is not in acute distress.    Appearance: Normal appearance. She is not toxic-appearing.  HENT:     Head: Normocephalic and atraumatic.  Eyes:     General: No scleral icterus. Cardiovascular:     Rate and Rhythm: Normal rate and regular rhythm.  Pulmonary:     Effort: Pulmonary effort is normal.     Breath sounds: Normal breath sounds.  Abdominal:     General: Bowel sounds are normal.     Palpations: Abdomen is soft.     Comments: Ostomy in place with reducible hernia.  Musculoskeletal:        General: No deformity.     Cervical back: Normal range of motion.     Right lower leg: Edema present.     Left lower leg: Edema present.     Comments: 3+ pitting edema to bilateral lower extremities. DP pulses intact. Sensation intact. No weeping from the skin noted. No erythema, ecchymosis, rashes, or overlying skin changes noted.   Skin:    General: Skin is warm and dry.  Neurological:     General: No focal deficit present.     Mental Status: She is alert.  Mental status is at baseline.    ED Results / Procedures / Treatments   Labs (all labs ordered are listed, but only abnormal results are displayed) Labs Reviewed  COMPREHENSIVE METABOLIC PANEL - Abnormal; Notable for the following components:      Result Value   Potassium 2.9 (*)    Glucose, Bld 69 (*)    BUN <5 (*)    Creatinine, Ser 0.41 (*)    Calcium 8.2 (*)    Albumin 2.0 (*)    AST 59 (*)    Alkaline Phosphatase 150 (*)    Total Bilirubin 3.4 (*)    All other components within normal limits  CBC - Abnormal; Notable for the following components:   RBC 3.12 (*)    Hemoglobin 9.7 (*)    HCT 32.7 (*)    MCV 104.8 (*)    MCHC 29.7 (*)    RDW 17.8 (*)    All other components within normal limits  BRAIN NATRIURETIC PEPTIDE - Abnormal; Notable for the following components:   B Natriuretic Peptide 387.0 (*)    All other components within normal limits  MAGNESIUM    EKG EKG Interpretation  Date/Time:  Thursday April 21 2021 15:12:19 EDT Ventricular Rate:  83 PR Interval:  124 QRS Duration: 84 QT Interval:  494 QTC Calculation: 581 R Axis:   -40 Text Interpretation: Sinus rhythm Probable left atrial enlargement Left axis deviation Abnormal R-wave progression, late transition Borderline T abnormalities, diffuse leads  similar to Sept 2022 Confirmed by Sherwood Gambler [phone]) on 04/21/2021 3:22:29 PM  Radiology No results found.  Procedures Procedures   Medications Ordered in ED Medications  potassium chloride 10 mEq in 100 mL IVPB (has no administration in time range)  furosemide (LASIX) tablet 20 mg (20 mg Oral Given 04/21/21 1507)  potassium chloride SA (KLOR-CON) CR tablet 40 mEq (40 mEq Oral Given 04/21/21 1733)    ED Course  I have reviewed the triage vital signs and the nursing notes.  Pertinent labs & imaging results that were available during my care of the patient were reviewed by me and considered in my medical decision making (see chart for  details).  ADA HOLNESS is a 50 y/o medically complex patient presenting with bilateral extremity  edema.  Due to poor follow-up and patient's insurance status, her primary care doctor recommended she present to the emergency department for a "full cardiac work-up".  Based on physical exam findings, the patient is going to require IV Lasix. Her potassium is 2.9.  40 mEq of potassium given p.o. and 76mq given IV.  Other than her potassium, CMP consistent with previous CMPs this month.  Patient's CBC still shows anemia.  Upon chart review, patient was admitted here on 04/09/2021 for anemia with hemoglobin of 7.2.  Patient's hemoglobin now is 9.7 with hematocrit of 32.7.  BNP ordered and is elevated at 387.  Magnesium within normal limits at 1.7.  EKG shows borderline QTC prolongation.  Based on these labs, EKG, and patient presentation, she will likely need admission for IV Lasix and further cardiac work-up. Dr. EDenton Brickadmitting.   MDM Rules/Calculators/A&P                          Final Clinical Impression(s) / ED Diagnoses Final diagnoses:  Bilateral lower extremity edema  Hypokalemia    Rx / DC Orders ED Discharge Orders     None        RSherrell Puller PA-C 04/21/21 1Roderic Palau MD 04/22/21 1123

## 2021-04-22 ENCOUNTER — Observation Stay (HOSPITAL_BASED_OUTPATIENT_CLINIC_OR_DEPARTMENT_OTHER): Payer: Medicaid Other

## 2021-04-22 ENCOUNTER — Other Ambulatory Visit (HOSPITAL_COMMUNITY): Payer: Self-pay | Admitting: *Deleted

## 2021-04-22 ENCOUNTER — Observation Stay (HOSPITAL_COMMUNITY): Payer: Medicaid Other

## 2021-04-22 DIAGNOSIS — R600 Localized edema: Secondary | ICD-10-CM

## 2021-04-22 LAB — BASIC METABOLIC PANEL
Anion gap: 5 (ref 5–15)
BUN: <5 mg/dL — ABNORMAL LOW (ref 6–20)
CO2: 25 mmol/L (ref 22–32)
Calcium: 8 mg/dL — ABNORMAL LOW (ref 8.9–10.3)
Chloride: 110 mmol/L (ref 98–111)
Creatinine, Ser: 0.5 mg/dL (ref 0.44–1.00)
GFR, Estimated: >60 mL/min (ref >60)
Glucose, Bld: 72 mg/dL (ref 70–99)
Potassium: 3.4 mmol/L — ABNORMAL LOW (ref 3.5–5.1)
Sodium: 140 mmol/L (ref 135–145)

## 2021-04-22 LAB — RESP PANEL BY RT-PCR (FLU A&B, COVID) ARPGX2
Influenza A by PCR: NEGATIVE
Influenza B by PCR: NEGATIVE
SARS Coronavirus 2 by RT PCR: NEGATIVE

## 2021-04-22 LAB — ECHOCARDIOGRAM COMPLETE
AR max vel: 2.17 cm2
AV Area VTI: 2.53 cm2
AV Area mean vel: 1.9 cm2
AV Mean grad: 5.5 mmHg
AV Peak grad: 9.4 mmHg
Ao pk vel: 1.54 m/s
Area-P 1/2: 4.6 cm2
Height: 57 in
S' Lateral: 3 cm
Weight: 2493.84 oz

## 2021-04-22 LAB — CBC
HCT: 27.8 % — ABNORMAL LOW (ref 36.0–46.0)
Hemoglobin: 8.5 g/dL — ABNORMAL LOW (ref 12.0–15.0)
MCH: 30.8 pg (ref 26.0–34.0)
MCHC: 30.6 g/dL (ref 30.0–36.0)
MCV: 100.7 fL — ABNORMAL HIGH (ref 80.0–100.0)
Platelets: 278 10*3/uL (ref 150–400)
RBC: 2.76 MIL/uL — ABNORMAL LOW (ref 3.87–5.11)
RDW: 17.5 % — ABNORMAL HIGH (ref 11.5–15.5)
WBC: 6.1 10*3/uL (ref 4.0–10.5)
nRBC: 0 % (ref 0.0–0.2)

## 2021-04-22 MED ORDER — TRAMADOL HCL 50 MG PO TABS
50.0000 mg | ORAL_TABLET | Freq: Four times a day (QID) | ORAL | Status: DC | PRN
  Administered 2021-04-22 – 2021-04-23 (×3): 50 mg via ORAL
  Filled 2021-04-22 (×3): qty 1

## 2021-04-22 MED ORDER — POTASSIUM CHLORIDE CRYS ER 20 MEQ PO TBCR
30.0000 meq | EXTENDED_RELEASE_TABLET | ORAL | Status: AC
Start: 2021-04-22 — End: 2021-04-22
  Administered 2021-04-22 (×2): 30 meq via ORAL
  Filled 2021-04-22 (×2): qty 2

## 2021-04-22 NOTE — Progress Notes (Signed)
*  PRELIMINARY RESULTS* Echocardiogram 2D Echocardiogram has been performed.  Alyssa Carey 04/22/2021, 11:58 AM

## 2021-04-22 NOTE — TOC Initial Note (Signed)
Transition of Care [facility]) - Initial/Assessment Note    Patient Details  Name: Alyssa Carey MRN: [id number] Date of Birth: [birth year]  Transition of Care [facility]) CM/SW Contact:    Shade Flood, LCSW Phone Number: 04/22/2021, 2:27 PM  Clinical Narrative:                  Pt admitted from home. Pt known to TOC from previous admissions. Received TOC consult for substance use treatment resources. Pt informed TOC during her last admission that she lives with her brother and her son. Pt is able to complete ADLs independently. Pt is able to provide her own transportation. Pt Has not had [place] services and does not feel that she would need them. Pt has her mothers old walker to use if needed.   Met with pt to assess and treatment resource list was provided. Also provided pt with written information on [facility] for medical care for uninsured [facility] residents. Pt states she will follow up.  TOC will assist if any other needs arise.    Expected Discharge Plan: Home/Self Care Barriers to Discharge: Continued Medical Work up   Patient Goals and CMS Choice Patient states their goals for this hospitalization and ongoing recovery are:: go home      Expected Discharge Plan and Services Expected Discharge Plan: Home/Self Care       Living arrangements for the past 2 months: Single Family Home                                      Prior Living Arrangements/Services Living arrangements for the past 2 months: Single Family Home Lives with:: Siblings Patient language and need for interpreter reviewed:: Yes Do you feel safe going back to the place where you live?: Yes      Need for Family Participation in Patient Care: No (Comment) Care giver support system in place?: Yes (comment) Current home services: DME Criminal Activity/Legal Involvement Pertinent to Current Situation/Hospitalization: No - Comment as needed  Activities of Daily Living      Permission  Sought/Granted                  Emotional Assessment Appearance:: Appears younger than stated age Attitude/Demeanor/Rapport: Engaged Affect (typically observed): Pleasant Orientation: : Oriented to Self, Oriented to Place, Oriented to  Time, Oriented to Situation Alcohol / Substance Use: Alcohol Use Psych Involvement: No (comment)  Admission diagnosis:  Swelling of lower extremity [M79.89] Patient Active Problem List   Diagnosis Date Noted   Swelling of lower extremity 04/21/2021   Symptomatic anemia due to Gi bleed 04/09/2021   Hypokalemia 03/24/2021   Hypoalbuminemia due to protein-calorie malnutrition ([place]) 03/24/2021   Hypoglycemia 03/24/2021   Obesity (BMI 30.0-34.9) 03/24/2021   Serum total bilirubin elevated 03/24/2021   Anemia 03/24/2021   Prolonged QT interval 03/24/2021   Acute hepatitis    Alcohol use    Abnormal liver enzymes 03/23/2021   PUD (peptic ulcer disease) 12/02/2020   Acute blood loss anemia 10/06/2020   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CC)    Elevated alkaline phosphatase level    Abdominal pain    Parastomal hernia without obstruction or gangrene    Acute GI bleeding 10/05/2020   Transaminitis 10/05/2020   HTN (hypertension) 10/05/2020   Mild protein malnutrition ([place]) 10/05/2020   Stercoral ulcer of rectum 01/12/2020   Aphonia 01/12/2020   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  Vocal fold paresis, 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   Hypernatremia 01/12/2020   Anasarca 01/12/2020   Necrotizing fasciitis of pelvic region and thigh ([place]) 12/28/2019   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   Acute respiratory failure with hypoxemia (HCC)    Alcoholic hepatitis without ascites    Pressure injury of skin 12/22/2019   Fall at home, initial encounter 12/21/2019   Weakness 12/21/2019   Alcoholism ([place]) 12/21/2019   Thrombocytopenia ([place]) 12/21/2019   ARF (acute renal failure) ([place]) 12/21/2019   Hepatic encephalopathy 62/70/3500   Alcoholic liver disease ([place])  12/21/2019   PCP:  [facility], [place]:   [facility] [address] [place] [address] Phone: [phone] Fax: [phone]     Social Determinants of Health (SDOH) Interventions    Readmission Risk Interventions Readmission Risk Prevention Plan 03/24/2021  Transportation Screening Complete  HRI or [place] Complete  Social Work Consult for [place] Planning/Counseling Complete  Palliative Care Screening Not Applicable  Medication Review Press photographer) Complete

## 2021-04-22 NOTE — Progress Notes (Signed)
PROGRESS NOTE    Alyssa Carey  ELF:[id number] DOB: 1970-07-26 DOA: 04/21/2021 PCP: [facility]    Brief Narrative:  Alyssa Carey is a 50 year old female with past medical history significant for alcoholic liver disease, PUD, hepatic encephalopathy, necrotizing fasciitis with resultant ostomy who presented to [facility] ED with complaints of progressive bilateral lower extremity edema.  She reports swelling started about 1 month ago and progressed after she recently was discharged from [facility].  During her previous hospitalization she reported giving a dose of IV Lasix with improvement of her lower extremity edema but since has returned.  Also feels that her swelling is up to her thighs.  Denies shortness of breath, no chest pain, no nausea/vomiting or change in her bowel habits.  Recently hospitalized from 9/20 4-03/2026 for acute on chronic symptomatic anemia requiring 2 units of PRBCs.  Patient with history of necrotizing fasciitis for which she has a diverting loop colostomy with resulting parasternal hernia and she was recently transferred to [facility] long hospital due to concerns for ischemic bowel.  Evaluated by general surgery and it was recommended that she follow-up outpatient for elective colostomy takedown and hernia repair.  In the ED, temperature 98.0 F, HR 87, RR 17, BP 114/76, SPO2 100% on room air.  Sodium 139, potassium 2.9, chloride 107, CO2 25, BUN less than 5, creatinine 0.47, glucose 69.  Albumin 2.0, AST 59, ALT 23, total bilirubin 3.4.  WBC 5.0, hemoglobin 9.7, platelets 237.  BNP elevated 387.0.  EtOH level less than 10.  Chest x-ray with no acute cardiopulmonary disease process.  TRH was consulted for further evaluation and management of progressive lower extremity edema, hypokalemia.   Assessment & Plan:   Principal Problem:   Swelling of lower extremity Active Problems:   Alcoholic liver disease (HCC)   HTN (hypertension)    Elevated alkaline phosphatase level   Hypokalemia   Hypoalbuminemia due to protein-calorie malnutrition (HCC)   Anemia   Bilateral lower extremity edema, progressive Patient presenting to the ED with progressive lower extremity edema over the past month.  Recently discharged and received IV Lasix during previous hospitalization with improvement, now returns.  Not on any diuretics outpatient.  BNP elevated on admission.  Complicated by hypoalbuminemia with albumin level 2.0.  Venous duplex ultrasound negative for DVT. --TTE: Pending --Furosemide 20 mg IV every 12 hours --Strict I's and O's and daily weights --Close monitoring of renal function while on IV diuresis  Hypokalemia Hypomagnesemia Potassium 2.9, magnesium 1.7 on arrival.  Repleted. --Repeat electrolytes in the a.m.  Prolonged QTC QTC on EKG noted to be 581.  Replete electrolytes. --Avoid QTC prolonging medications --Monitor on telemetry  Alcoholic liver disease Total bilirubin 3.4, AST 59, ALT 23; consistent with prior labs.  Denies continued alcohol use, reports last use 6 months ago.  EtOH level on admission less than 10. --CIWA protocol with symptom triggered Ativan --Thiamine, folic acid, multivitamin  Chronic anemia; macrocytic Hemoglobin 8.5 this morning, recently on discharge was 7.7.  MCV 100.7.  Last hospitalization required 2 units PRBC transfusion.  Anemia panel 03/24/2021 with iron 104, TIBC 116, ferritin 1343 and folate 9.7.  Hx PUD/alcoholic gastritis Recent EGD June 2022 with gastritis. --Continue oral famotidine (allergy to Protonix)  Hx necrotizing fasciitis In June 2021, patient with large parasternal hernia with necrotizing fasciitis necessitating diverting colostomy.  Recently discharged from [facility] long hospital on 9/27 with recommendations of outpatient follow-up for elective colostomy takedown and hernia repair. --Wound/ostomy consult  for continued ostomy care   DVT prophylaxis: heparin injection  5,000 Units Start: 04/21/21 2200   Code Status: Full Code Family Communication: No family present at bedside this morning.  Disposition Plan:  Level of care: Telemetry Status is: Observation  The patient remains OBS appropriate and will d/c before 2 midnights.  Dispo: The patient is from: Home              Anticipated d/c is to: Home              Patient currently is not medically stable to d/c.   Difficult to place patient No   Consultants:  None  Procedures:  Venous duplex ultrasound TTE  Antimicrobials:  None   Subjective: Patient seen examined bedside, resting comfortably.  Continues to complain of lower extremity edema.  No other specific complaints or concerns at this time.  Denies headache, no dizziness, no chest pain, no palpitations, no shortness of breath, no abdominal pain, no weakness, no fatigue, no paresthesias.  No acute events overnight per nursing staff.  Objective: Vitals:   04/21/21 2330 04/22/21 0240 04/22/21 0620 04/22/21 1041  BP: 134/79 122/85 134/82 103/67  Pulse: 82 89 95 (!) 101  Resp: 18 17 17 18   Temp:      TempSrc:      SpO2: 100% 99% 100% 100%  Weight:      Height:        Intake/Output Summary (Last 24 hours) at 04/22/2021 1304 Last data filed at 04/21/2021 1935 Gross per 24 hour  Intake 100 ml  Output --  Net 100 ml   Filed Weights   04/21/21 1334  Weight: 70.7 kg    Examination:  General exam: Appears calm and comfortable  Respiratory system: Clear to auscultation. Respiratory effort normal.  On room air Cardiovascular system: S1 & S2 heard, RRR. No JVD, murmurs, rubs, gallops or clicks.  2+ pitting edema bilateral lower extremities to knees Gastrointestinal system: Abdomen is nondistended, soft and nontender. No organomegaly or masses felt. Normal bowel sounds heard.  Ostomy noted with liquid brown stool in collection bag Central nervous system: Alert and oriented. No focal neurological deficits. Extremities: Symmetric 5 x 5  power. Skin: No rashes, lesions or ulcers Psychiatry: Judgement and insight appear normal. Mood & affect appropriate.     Data Reviewed: I have personally reviewed following labs and imaging studies  CBC: Recent Labs  Lab 04/21/21 1528 04/22/21 0406  WBC 5.0 6.1  HGB 9.7* 8.5*  HCT 32.7* 27.8*  MCV 104.8* 100.7*  PLT 237 944   Basic Metabolic Panel: Recent Labs  Lab 04/21/21 1528 04/21/21 1534 04/22/21 0406  NA 139  --  140  K 2.9*  --  3.4*  CL 107  --  110  CO2 25  --  25  GLUCOSE 69*  --  72  BUN <5*  --  <5*  CREATININE 0.41*  --  0.50  CALCIUM 8.2*  --  8.0*  MG  --  1.7  --    GFR: Estimated Creatinine Clearance: 69.8 mL/min (by C-G formula based on SCr of 0.5 mg/dL). Liver Function Tests: Recent Labs  Lab 04/21/21 1528  AST 59*  ALT 23  ALKPHOS 150*  BILITOT 3.4*  PROT 6.6  ALBUMIN 2.0*   No results for input(s): LIPASE, AMYLASE in the last 168 hours. No results for input(s): AMMONIA in the last 168 hours. Coagulation Profile: No results for input(s): INR, PROTIME in the last 168 hours. Cardiac  Enzymes: No results for input(s): CKTOTAL, CKMB, CKMBINDEX, TROPONINI in the last 168 hours. BNP (last 3 results) No results for input(s): PROBNP in the last 8760 hours. HbA1C: No results for input(s): HGBA1C in the last 72 hours. CBG: No results for input(s): GLUCAP in the last 168 hours. Lipid Profile: No results for input(s): CHOL, HDL, LDLCALC, TRIG, CHOLHDL, LDLDIRECT in the last 72 hours. Thyroid Function Tests: No results for input(s): TSH, T4TOTAL, FREET4, T3FREE, THYROIDAB in the last 72 hours. Anemia Panel: No results for input(s): VITAMINB12, FOLATE, FERRITIN, TIBC, IRON, RETICCTPCT in the last 72 hours. Sepsis Labs: No results for input(s): PROCALCITON, LATICACIDVEN in the last 168 hours.  Recent Results (from the past 240 hour(s))  Resp Panel by RT-PCR (Flu A&B, Covid) Nasopharyngeal Swab     Status: None   Collection Time: 04/22/21  10:05 AM   Specimen: Nasopharyngeal Swab; Nasopharyngeal(NP) swabs in vial transport medium  Result Value Ref Range Status   SARS Coronavirus 2 by RT PCR NEGATIVE NEGATIVE Final    Comment: (NOTE) SARS-CoV-2 target nucleic acids are NOT DETECTED.  The SARS-CoV-2 RNA is generally detectable in upper respiratory specimens during the acute phase of infection. The lowest concentration of SARS-CoV-2 viral copies this assay can detect is 138 copies/mL. A negative result does not preclude SARS-Cov-2 infection and should not be used as the sole basis for treatment or other patient management decisions. A negative result may occur with  improper specimen collection/handling, submission of specimen other than nasopharyngeal swab, presence of viral mutation(s) within the areas targeted by this assay, and inadequate number of viral copies(<138 copies/mL). A negative result must be combined with clinical observations, patient history, and epidemiological information. The expected result is Negative.  Fact Sheet for Patients:  EntrepreneurPulse.com.au  Fact Sheet for Healthcare Providers:  IncredibleEmployment.be  This test is no t yet approved or cleared by the [place] FDA and  has been authorized for detection and/or diagnosis of SARS-CoV-2 by FDA under an Emergency Use Authorization (EUA). This EUA will remain  in effect (meaning this test can be used) for the duration of the COVID-19 declaration under Section 564(b)(1) of the Act, 21 U.S.C.section 360bbb-3(b)(1), unless the authorization is terminated  or revoked sooner.       Influenza A by PCR NEGATIVE NEGATIVE Final   Influenza B by PCR NEGATIVE NEGATIVE Final    Comment: (NOTE) The Xpert Xpress SARS-CoV-2/FLU/RSV plus assay is intended as an aid in the diagnosis of influenza from Nasopharyngeal swab specimens and should not be used as a sole basis for treatment. Nasal washings and aspirates  are unacceptable for Xpert Xpress SARS-CoV-2/FLU/RSV testing.  Fact Sheet for Patients: EntrepreneurPulse.com.au  Fact Sheet for Healthcare Providers: IncredibleEmployment.be  This test is not yet approved or cleared by the [place] FDA and has been authorized for detection and/or diagnosis of SARS-CoV-2 by FDA under an Emergency Use Authorization (EUA). This EUA will remain in effect (meaning this test can be used) for the duration of the COVID-19 declaration under Section 564(b)(1) of the Act, 21 U.S.C. section 360bbb-3(b)(1), unless the authorization is terminated or revoked.  Performed at [facility], [address], [place], [place] [address]          Radiology Studies: US Venous Img Lower Bilateral (DVT)  Result Date: 04/22/2021 CLINICAL DATA:  50 year old female with a history of bilateral lower extremity swelling EXAM: BILATERAL LOWER EXTREMITY VENOUS DOPPLER ULTRASOUND TECHNIQUE: Gray-scale sonography with graded compression, as well as color Doppler and duplex ultrasound  were performed to evaluate the lower extremity deep venous systems from the level of the common femoral vein and including the common femoral, femoral, profunda femoral, popliteal and calf veins including the posterior tibial, peroneal and gastrocnemius veins when visible. The superficial great saphenous vein was also interrogated. Spectral Doppler was utilized to evaluate flow at rest and with distal augmentation maneuvers in the common femoral, femoral and popliteal veins. COMPARISON:  None. FINDINGS: RIGHT LOWER EXTREMITY Common Femoral Vein: No evidence of thrombus. Normal compressibility, respiratory phasicity and response to augmentation. Saphenofemoral Junction: No evidence of thrombus. Normal compressibility and flow on color Doppler imaging. Profunda Femoral Vein: No evidence of thrombus. Normal compressibility and flow on color Doppler imaging. Femoral Vein: No  evidence of thrombus. Normal compressibility, respiratory phasicity and response to augmentation. Popliteal Vein: No evidence of thrombus. Normal compressibility, respiratory phasicity and response to augmentation. Calf Veins: No evidence of thrombus. Normal compressibility and flow on color Doppler imaging. Superficial Great Saphenous Vein: No evidence of thrombus. Normal compressibility and flow on color Doppler imaging Other Findings:  Edema LEFT LOWER EXTREMITY Common Femoral Vein: No evidence of thrombus. Normal compressibility, respiratory phasicity and response to augmentation. Saphenofemoral Junction: No evidence of thrombus. Normal compressibility and flow on color Doppler imaging. Profunda Femoral Vein: No evidence of thrombus. Normal compressibility and flow on color Doppler imaging. Femoral Vein: No evidence of thrombus. Normal compressibility, respiratory phasicity and response to augmentation. Popliteal Vein: No evidence of thrombus. Normal compressibility, respiratory phasicity and response to augmentation. Calf Veins: No evidence of thrombus. Normal compressibility and flow on color Doppler imaging. Superficial Great Saphenous Vein: No evidence of thrombus. Normal compressibility and flow on color Doppler imaging. Other Findings: Edema. Lentiform fluid collection with minimal internal echoes in the popliteal region, 5.5 cm x 1.2 cm by 3.9 cm compatible with Baker's cyst. IMPRESSION: Sonographic survey of the bilateral lower extremities negative for DVT Bilateral edema Electronically Signed   By: Corrie Mckusick D.O.   On: 04/22/2021 08:46   DG CHEST PORT 1 VIEW  Result Date: 04/21/2021 CLINICAL DATA:  Lower extremity swelling. EXAM: PORTABLE CHEST 1 VIEW COMPARISON:  Chest x-ray 01/12/2020. FINDINGS: The heart size and mediastinal contours are within normal limits. Both lungs are clear. The visualized skeletal structures are unremarkable. IMPRESSION: No active disease. Electronically Signed   By:  Ronney Asters M.D.   On: 04/21/2021 19:56        Scheduled Meds:  famotidine  20 mg Oral BID   folic acid  1 mg Oral Daily   furosemide  20 mg Intravenous Q12H   heparin  5,000 Units Subcutaneous Q8H   lactose free nutrition  237 mL Oral TID WC   multivitamin with minerals  1 tablet Oral Daily   potassium chloride  30 mEq Oral Q4H   thiamine  100 mg Oral Daily   Or   thiamine  100 mg Intravenous Daily   Continuous Infusions:   LOS: 0 days    Time spent: 41 minutes spent on chart review, discussion with nursing staff, consultants, updating family and interview/physical exam; more than 50% of that time was spent in counseling and/or coordination of care.    Jnyah Brazee J [place] DO [facility] Available via Epic secure chat 7am-7pm After these hours, please refer to coverage provider listed on [email or web address] 04/22/2021, 1:04 PM

## 2021-04-22 NOTE — ED Notes (Signed)
Pt sleeping soundly, eyes closed, resps even and nonlabored.  VS deferred at this time, will update with pt wakes.

## 2021-04-22 NOTE — ED Notes (Signed)
Pt changed her colostomy bag.

## 2021-04-23 DIAGNOSIS — R600 Localized edema: Secondary | ICD-10-CM

## 2021-04-23 DIAGNOSIS — R748 Abnormal levels of other serum enzymes: Secondary | ICD-10-CM

## 2021-04-23 DIAGNOSIS — M7989 Other specified soft tissue disorders: Secondary | ICD-10-CM

## 2021-04-23 LAB — BASIC METABOLIC PANEL
Anion gap: 4 — ABNORMAL LOW (ref 5–15)
BUN: <5 mg/dL — ABNORMAL LOW (ref 6–20)
CO2: 26 mmol/L (ref 22–32)
Calcium: 8.2 mg/dL — ABNORMAL LOW (ref 8.9–10.3)
Chloride: 110 mmol/L (ref 98–111)
Creatinine, Ser: 0.49 mg/dL (ref 0.44–1.00)
GFR, Estimated: >60 mL/min (ref >60)
Glucose, Bld: 65 mg/dL — ABNORMAL LOW (ref 70–99)
Potassium: 3.8 mmol/L (ref 3.5–5.1)
Sodium: 140 mmol/L (ref 135–145)

## 2021-04-23 LAB — MAGNESIUM: Magnesium: 1.6 mg/dL — ABNORMAL LOW (ref 1.7–2.4)

## 2021-04-23 MED ORDER — DIPHENHYDRAMINE HCL 25 MG PO CAPS
25.0000 mg | ORAL_CAPSULE | Freq: Once | ORAL | Status: AC
  Administered 2021-04-23: 25 mg via ORAL
  Filled 2021-04-23: qty 1

## 2021-04-23 MED ORDER — FUROSEMIDE 40 MG PO TABS: 40.0000 mg | ORAL_TABLET | Freq: Every day | ORAL | 0 refills | Status: DC

## 2021-04-23 MED ORDER — HYDROXYZINE HCL 10 MG PO TABS: 10.0000 mg | ORAL_TABLET | Freq: Three times a day (TID) | ORAL | 0 refills | Status: DC | PRN

## 2021-04-23 NOTE — Discharge Summary (Signed)
Physician Discharge Summary  Alyssa Carey DXI:[id number] DOB: February 06, 1971 DOA: 04/21/2021  PCP: [facility]  Admit date: 04/21/2021 Discharge date: 04/23/2021  Admitted From: Home Disposition:  Home  Recommendations for Outpatient Follow-up:  Follow up with PCP in 1-2 weeks  Discharge Condition:Stable CODE STATUS:Full Diet recommendation: Heart healthy   Brief/Interim Summary: 50 year old female with past medical history significant for alcoholic liver disease, PUD, hepatic encephalopathy, necrotizing fasciitis with resultant ostomy who presented to [facility] ED with complaints of progressive bilateral lower extremity edema.  Discharge Diagnoses:  Principal Problem:   Swelling of lower extremity Active Problems:   Alcoholic liver disease (HCC)   HTN (hypertension)   Elevated alkaline phosphatase level   Hypokalemia   Hypoalbuminemia due to protein-calorie malnutrition (HCC)   Anemia    Bilateral lower extremity edema, progressive Patient presenting to the ED with progressive lower extremity edema over the past month.  Recently discharged and received IV Lasix during previous hospitalization with improvement, now returns.  Not on any diuretics outpatient.  BNP elevated on admission.  Complicated by hypoalbuminemia with albumin level 2.0.  Venous duplex ultrasound negative for DVT. -much improved with trial of IV lasix overnight -Have prescribed scheduled po lasix on d/c   Hypokalemia Hypomagnesemia Potassium 2.9, magnesium 1.7 on arrival.  Repleted.   Prolonged QTC QTC on EKG noted to be 581.  Replete electrolytes.  Alcoholic liver disease Total bilirubin 3.4, AST 59, ALT 23; consistent with prior labs.  Denies continued alcohol use, reports last use 6 months ago.  EtOH level on admission less than 10. --Was continued on CIWA while in hospital   Chronic anemia; macrocytic Remained hemodynamically stable   Hx PUD/alcoholic gastritis Recent EGD  June 2022 with gastritis. --Continue oral famotidine (allergy to Protonix)   Hx necrotizing fasciitis In June 2021, patient with large parasternal hernia with necrotizing fasciitis necessitating diverting colostomy.  Recently discharged from [facility] on 9/27 with recommendations of outpatient follow-up for elective colostomy takedown and hernia repair. --Wound/ostomy consult for continued ostomy care    Discharge Instructions   Allergies as of 04/23/2021       Reactions   Pantoprazole    Stomach pain   Penicillins Hives   Did it involve swelling of the face/tongue/throat, SOB, or low BP? N Did it involve sudden or severe rash/hives, skin peeling, or any reaction on the inside of your mouth or nose? Y Did you need to seek medical attention at a hospital or doctor's office? N When did it last happen?  Childhood     If all above answers are "NO", may proceed with cephalosporin use.   Oxycodone Hcl Rash        Medication List     STOP taking these medications    ondansetron 4 MG tablet Commonly known as: Zofran   thiamine 100 MG tablet       TAKE these medications    acetaminophen 325 MG tablet Commonly known as: TYLENOL Take 325 mg by mouth every 6 (six) hours as needed.   BOOST PO Take 1 Bottle by mouth 4 (four) times daily.   cholecalciferol 25 MCG (1000 UNIT) tablet Commonly known as: VITAMIN D3 Take 1,000 Units by mouth daily.   famotidine 20 MG tablet Commonly known as: PEPCID Take 1 tablet (20 mg total) by mouth 2 (two) times daily.   folic acid 1 MG tablet Commonly known as: FOLVITE Take 1 tablet (1 mg total) by mouth daily.   furosemide 40  MG tablet Commonly known as: Lasix Take 1 tablet (40 mg total) by mouth daily.   hydrOXYzine 10 MG tablet Commonly known as: ATARAX/VISTARIL Take 1 tablet (10 mg total) by mouth 3 (three) times daily as needed for itching.   multivitamin with minerals Tabs tablet Take 1 tablet by mouth daily.    vitamin C 500 MG tablet Commonly known as: ASCORBIC ACID Take 500 mg by mouth daily.        Follow-up Information     [facility] Follow up.   Why: Hospital follow up Contact information: [place] [address] [phone]                Allergies  Allergen Reactions   Pantoprazole     Stomach pain   Penicillins Hives    Did it involve swelling of the face/tongue/throat, SOB, or low BP? N Did it involve sudden or severe rash/hives, skin peeling, or any reaction on the inside of your mouth or nose? Y Did you need to seek medical attention at a hospital or doctor's office? N When did it last happen?  Childhood     If all above answers are "NO", may proceed with cephalosporin use.    Oxycodone Hcl Rash    Procedures/Studies: CT ABDOMEN PELVIS W CONTRAST  Addendum Date: 04/10/2021   ADDENDUM REPORT: 04/10/2021 12:43 ADDENDUM: Initially I spoke with Dr. Gala Romney and stated that that there did not appear to be any definite evidence of bowel obstruction or ischemic bowel involving the bowel loops present within the peristomal hernia. There is noted a small amount of fluid in the dependent portion which is adjacent to nondilated small bowel loops. However, upon further more in-depth review of these images there is noted a collection of air that can not be definitively identified as air-filled bowel, and therefore the possibility of this representing intramural gas in ischemic bowel can not be excluded, although felt to be less likely. It is recommended that repeat CT scan be performed for further evaluation. These results were called by telephone at the time of interpretation on 04/10/2021 at 12:38 pm to provider Dr. Joesph Fillers, who verbally acknowledged these results. Electronically Signed   By: Marijo Conception M.D.   On: 04/10/2021 12:43   Result Date: 04/10/2021 CLINICAL DATA:  Acute generalized abdominal pain. EXAM: CT ABDOMEN AND PELVIS WITH CONTRAST  TECHNIQUE: Multidetector CT imaging of the abdomen and pelvis was performed using the standard protocol following bolus administration of intravenous contrast. CONTRAST:  142m OMNIPAQUE IOHEXOL 300 MG/ML  SOLN COMPARISON:  March 23, 2021. FINDINGS: Lower chest: No acute abnormality. Hepatobiliary: Minimal cholelithiasis is noted. No biliary dilatation is noted. Hepatic steatosis is noted. Pancreas: Unremarkable. No pancreatic ductal dilatation or surrounding inflammatory changes. Spleen: Normal in size without focal abnormality. Adrenals/Urinary Tract: Adrenal glands are unremarkable. Kidneys are normal, without renal calculi, focal lesion, or hydronephrosis. Bladder is unremarkable. Stomach/Bowel: Stomach appears normal. Colostomy is noted in left lower quadrant with large peristomal hernia which contains loops of nondilated large and small bowel. There is no evidence of bowel obstruction or acute inflammation. Vascular/Lymphatic: No significant vascular findings are present. No enlarged abdominal or pelvic lymph nodes. Reproductive: Uterus and bilateral adnexa are unremarkable. Other: No abdominal wall hernia or abnormality. No abdominopelvic ascites. Musculoskeletal: No acute or significant osseous findings. IMPRESSION: Hepatic steatosis. Minimal cholelithiasis. Colostomy is again noted in the left lower quadrant with large peristomal hernia which contains loops of nondilated large and small bowel.  Electronically Signed: By: Marijo Conception M.D. On: 04/09/2021 09:46   US Venous Img Lower Bilateral (DVT)  Result Date: 04/22/2021 CLINICAL DATA:  50 year old female with a history of bilateral lower extremity swelling EXAM: BILATERAL LOWER EXTREMITY VENOUS DOPPLER ULTRASOUND TECHNIQUE: Gray-scale sonography with graded compression, as well as color Doppler and duplex ultrasound were performed to evaluate the lower extremity deep venous systems from the level of the common femoral vein and including the common  femoral, femoral, profunda femoral, popliteal and calf veins including the posterior tibial, peroneal and gastrocnemius veins when visible. The superficial great saphenous vein was also interrogated. Spectral Doppler was utilized to evaluate flow at rest and with distal augmentation maneuvers in the common femoral, femoral and popliteal veins. COMPARISON:  None. FINDINGS: RIGHT LOWER EXTREMITY Common Femoral Vein: No evidence of thrombus. Normal compressibility, respiratory phasicity and response to augmentation. Saphenofemoral Junction: No evidence of thrombus. Normal compressibility and flow on color Doppler imaging. Profunda Femoral Vein: No evidence of thrombus. Normal compressibility and flow on color Doppler imaging. Femoral Vein: No evidence of thrombus. Normal compressibility, respiratory phasicity and response to augmentation. Popliteal Vein: No evidence of thrombus. Normal compressibility, respiratory phasicity and response to augmentation. Calf Veins: No evidence of thrombus. Normal compressibility and flow on color Doppler imaging. Superficial Great Saphenous Vein: No evidence of thrombus. Normal compressibility and flow on color Doppler imaging Other Findings:  Edema LEFT LOWER EXTREMITY Common Femoral Vein: No evidence of thrombus. Normal compressibility, respiratory phasicity and response to augmentation. Saphenofemoral Junction: No evidence of thrombus. Normal compressibility and flow on color Doppler imaging. Profunda Femoral Vein: No evidence of thrombus. Normal compressibility and flow on color Doppler imaging. Femoral Vein: No evidence of thrombus. Normal compressibility, respiratory phasicity and response to augmentation. Popliteal Vein: No evidence of thrombus. Normal compressibility, respiratory phasicity and response to augmentation. Calf Veins: No evidence of thrombus. Normal compressibility and flow on color Doppler imaging. Superficial Great Saphenous Vein: No evidence of thrombus. Normal  compressibility and flow on color Doppler imaging. Other Findings: Edema. Lentiform fluid collection with minimal internal echoes in the popliteal region, 5.5 cm x 1.2 cm by 3.9 cm compatible with Baker's cyst. IMPRESSION: Sonographic survey of the bilateral lower extremities negative for DVT Bilateral edema Electronically Signed   By: Corrie Mckusick D.O.   On: 04/22/2021 08:46   DG CHEST PORT 1 VIEW  Result Date: 04/21/2021 CLINICAL DATA:  Lower extremity swelling. EXAM: PORTABLE CHEST 1 VIEW COMPARISON:  Chest x-ray 01/12/2020. FINDINGS: The heart size and mediastinal contours are within normal limits. Both lungs are clear. The visualized skeletal structures are unremarkable. IMPRESSION: No active disease. Electronically Signed   By: Ronney Asters M.D.   On: 04/21/2021 19:56   ECHOCARDIOGRAM COMPLETE  Result Date: 04/22/2021    ECHOCARDIOGRAM REPORT   Patient Name:   Lilino[place] ARDICE BOYAN Date of Exam: 04/22/2021 Medical Rec #:  [id number]       Height:       57.0 in Accession #:    [id number]      Weight:       155.9 lb Date of Birth:  1971-05-26       BSA:          1.618 m Patient Age:    27 years        BP:           103/67 mmHg Patient Gender: F               HR:  84 bpm. Exam Location:  [facility] Procedure: 2D Echo, Cardiac Doppler and Color Doppler Indications:    Swelling of lower extremity  History:        Patient has no prior history of Echocardiogram examinations.                 Risk Factors:Hypertension. Alcoholism ([place]). ARF (acute renal                 failure) ([place]).  Sonographer:    Alvino Chapel RCS Referring Phys: [phone] [place]  1. Left ventricular ejection fraction, by estimation, is 60 to 65%. The left ventricle has normal function. The left ventricle has no regional wall motion abnormalities. Left ventricular diastolic parameters were normal.  2. Right ventricular systolic function is normal. The right ventricular size is normal. Tricuspid regurgitation signal  is inadequate for assessing PA pressure.  3. Left atrial size was mildly dilated.  4. Poorly visualzied linear semi mobil structure is noted in the right atrium. Probable prominent eustachian valve.  5. The mitral valve is normal in structure. No evidence of mitral valve regurgitation. No evidence of mitral stenosis.  6. The aortic valve is tricuspid. There is mild calcification of the aortic valve. There is mild thickening of the aortic valve. Aortic valve regurgitation is not visualized. No aortic stenosis is present.  7. The inferior vena cava is dilated in size with >50% respiratory variability, suggesting right atrial pressure of 8 mmHg. FINDINGS  Left Ventricle: Left ventricular ejection fraction, by estimation, is 60 to 65%. The left ventricle has normal function. The left ventricle has no regional wall motion abnormalities. The left ventricular internal cavity size was normal in size. There is  no left ventricular hypertrophy. Left ventricular diastolic parameters were normal. Right Ventricle: The right ventricular size is normal. No increase in right ventricular wall thickness. Right ventricular systolic function is normal. Tricuspid regurgitation signal is inadequate for assessing PA pressure. Left Atrium: Left atrial size was mildly dilated. Right Atrium: Poorly visualzied linear semi mobil structure is noted in the right atrium. Probable prominent eustachian valve. Right atrial size was normal in size. Pericardium: There is no evidence of pericardial effusion. Mitral Valve: The mitral valve is normal in structure. No evidence of mitral valve regurgitation. No evidence of mitral valve stenosis. Tricuspid Valve: The tricuspid valve is normal in structure. Tricuspid valve regurgitation is not demonstrated. No evidence of tricuspid stenosis. Aortic Valve: The aortic valve is tricuspid. There is mild calcification of the aortic valve. There is mild thickening of the aortic valve. There is mild aortic valve  annular calcification. Aortic valve regurgitation is not visualized. No aortic stenosis  is present. Aortic valve mean gradient measures 5.5 mmHg. Aortic valve peak gradient measures 9.4 mmHg. Aortic valve area, by VTI measures 2.53 cm. Pulmonic Valve: The pulmonic valve was not well visualized. Pulmonic valve regurgitation is not visualized. No evidence of pulmonic stenosis. Aorta: The aortic root is normal in size and structure. Venous: The inferior vena cava is dilated in size with greater than 50% respiratory variability, suggesting right atrial pressure of 8 mmHg. IAS/Shunts: The interatrial septum was not well visualized.  LEFT VENTRICLE PLAX 2D LVIDd:         5.00 cm   Diastology LVIDs:         3.00 cm   LV e' medial:    9.30 cm/s LV PW:         1.00 cm   LV E/e' medial:  12.3 LV IVS:        0.90 cm   LV e' lateral:   18.50 cm/s LVOT diam:     2.00 cm   LV E/e' lateral: 6.2 LV SV:         86 LV SV Index:   53 LVOT Area:     3.14 cm  RIGHT VENTRICLE RV S prime:     16.30 cm/s TAPSE (M-mode): 2.4 cm LEFT ATRIUM             Index        RIGHT ATRIUM           Index LA diam:        3.50 cm 2.16 cm/m   RA Area:     12.20 cm LA Vol (A2C):   43.3 ml 26.76 ml/m  RA Volume:   26.30 ml  16.26 ml/m LA Vol (A4C):   70.4 ml 43.52 ml/m LA Biplane Vol: 61.3 ml 37.89 ml/m  AORTIC VALVE AV Area (Vmax):    2.17 cm AV Area (Vmean):   1.90 cm AV Area (VTI):     2.53 cm AV Vmax:           153.65 cm/s AV Vmean:          111.167 cm/s AV VTI:            0.341 m AV Peak Grad:      9.4 mmHg AV Mean Grad:      5.5 mmHg LVOT Vmax:         106.00 cm/s LVOT Vmean:        67.300 cm/s LVOT VTI:          0.274 m LVOT/AV VTI ratio: 0.80  AORTA Ao Root diam: 2.80 cm MITRAL VALVE MV Area (PHT): 4.60 cm     SHUNTS MV Decel Time: 165 msec     Systemic VTI:  0.27 m MV E velocity: 114.00 cm/s  Systemic Diam: 2.00 cm MV A velocity: 87.80 cm/s MV E/A ratio:  1.30 Carlyle Dolly MD Electronically signed by Carlyle Dolly MD Signature  Date/Time: 04/22/2021/2:42:46 PM    Final     Subjective: Eager to go home  Discharge Exam: Vitals:   04/22/21 2228 04/23/21 0556  BP: 120/81 118/77  Pulse: 92 87  Resp: 16 18  Temp: 98 F (36.7 C) 97.6 F (36.4 C)  SpO2: 100% 96%   Vitals:   04/22/21 1733 04/22/21 2228 04/23/21 0500 04/23/21 0556  BP: (!) 123/91 120/81  118/77  Pulse: 89 92  87  Resp: 15 16  18   Temp: 98.8 F (37.1 C) 98 F (36.7 C)  97.6 F (36.4 C)  TempSrc: Oral Oral  Oral  SpO2: 100% 100%  96%  Weight:   75.8 kg   Height:        General: Pt is alert, awake, not in acute distress Cardiovascular: RRR, S1/S2 + Respiratory: CTA bilaterally, no wheezing, no rhonchi Abdominal: Soft, NT, ND, bowel sounds + Extremities: no edema, no cyanosis   The results of significant diagnostics from this hospitalization (including imaging, microbiology, ancillary and laboratory) are listed below for reference.     Microbiology: Recent Results (from the past 240 hour(s))  Resp Panel by RT-PCR (Flu A&B, Covid) Nasopharyngeal Swab     Status: None   Collection Time: 04/22/21 10:05 AM   Specimen: Nasopharyngeal Swab; Nasopharyngeal(NP) swabs in vial transport medium  Result Value Ref Range Status   SARS Coronavirus 2 by  RT PCR NEGATIVE NEGATIVE Final    Comment: (NOTE) SARS-CoV-2 target nucleic acids are NOT DETECTED.  The SARS-CoV-2 RNA is generally detectable in upper respiratory specimens during the acute phase of infection. The lowest concentration of SARS-CoV-2 viral copies this assay can detect is 138 copies/mL. A negative result does not preclude SARS-Cov-2 infection and should not be used as the sole basis for treatment or other patient management decisions. A negative result may occur with  improper specimen collection/handling, submission of specimen other than nasopharyngeal swab, presence of viral mutation(s) within the areas targeted by this assay, and inadequate number of viral copies(<138  copies/mL). A negative result must be combined with clinical observations, patient history, and epidemiological information. The expected result is Negative.  Fact Sheet for Patients:  EntrepreneurPulse.com.au  Fact Sheet for Healthcare Providers:  IncredibleEmployment.be  This test is no t yet approved or cleared by the [place] FDA and  has been authorized for detection and/or diagnosis of SARS-CoV-2 by FDA under an Emergency Use Authorization (EUA). This EUA will remain  in effect (meaning this test can be used) for the duration of the COVID-19 declaration under Section 564(b)(1) of the Act, 21 U.S.C.section 360bbb-3(b)(1), unless the authorization is terminated  or revoked sooner.       Influenza A by PCR NEGATIVE NEGATIVE Final   Influenza B by PCR NEGATIVE NEGATIVE Final    Comment: (NOTE) The Xpert Xpress SARS-CoV-2/FLU/RSV plus assay is intended as an aid in the diagnosis of influenza from Nasopharyngeal swab specimens and should not be used as a sole basis for treatment. Nasal washings and aspirates are unacceptable for Xpert Xpress SARS-CoV-2/FLU/RSV testing.  Fact Sheet for Patients: EntrepreneurPulse.com.au  Fact Sheet for Healthcare Providers: IncredibleEmployment.be  This test is not yet approved or cleared by the [place] FDA and has been authorized for detection and/or diagnosis of SARS-CoV-2 by FDA under an Emergency Use Authorization (EUA). This EUA will remain in effect (meaning this test can be used) for the duration of the COVID-19 declaration under Section 564(b)(1) of the Act, 21 U.S.C. section 360bbb-3(b)(1), unless the authorization is terminated or revoked.  Performed at [facility], [address] [place], [place] [address]      Labs: BNP (last 3 results) Recent Labs    04/21/21 1528  BNP 482.5*   Basic Metabolic Panel: Recent Labs  Lab 04/21/21 1528  04/21/21 1534 04/22/21 0406 04/23/21 0422  NA 139  --  140 140  K 2.9*  --  3.4* 3.8  CL 107  --  110 110  CO2 25  --  25 26  GLUCOSE 69*  --  72 65*  BUN <5*  --  <5* <5*  CREATININE 0.41*  --  0.50 0.49  CALCIUM 8.2*  --  8.0* 8.2*  MG  --  1.7  --  1.6*   Liver Function Tests: Recent Labs  Lab 04/21/21 1528  AST 59*  ALT 23  ALKPHOS 150*  BILITOT 3.4*  PROT 6.6  ALBUMIN 2.0*   No results for input(s): LIPASE, AMYLASE in the last 168 hours. No results for input(s): AMMONIA in the last 168 hours. CBC: Recent Labs  Lab 04/21/21 1528 04/22/21 0406  WBC 5.0 6.1  HGB 9.7* 8.5*  HCT 32.7* 27.8*  MCV 104.8* 100.7*  PLT 237 278   Cardiac Enzymes: No results for input(s): CKTOTAL, CKMB, CKMBINDEX, TROPONINI in the last 168 hours. BNP: Invalid input(s): POCBNP CBG: No results for input(s): GLUCAP in the last 168  hours. D-Dimer No results for input(s): DDIMER in the last 72 hours. Hgb A1c No results for input(s): HGBA1C in the last 72 hours. Lipid Profile No results for input(s): CHOL, HDL, LDLCALC, TRIG, CHOLHDL, LDLDIRECT in the last 72 hours. Thyroid function studies No results for input(s): TSH, T4TOTAL, T3FREE, THYROIDAB in the last 72 hours.  Invalid input(s): FREET3 Anemia work up No results for input(s): VITAMINB12, FOLATE, FERRITIN, TIBC, IRON, RETICCTPCT in the last 72 hours. Urinalysis    Component Value Date/Time   COLORURINE BROWN (A) 03/23/2021 2000   APPEARANCEUR CLEAR 03/23/2021 2000   LABSPEC 1.025 03/23/2021 2000   PHURINE 6.5 03/23/2021 2000   GLUCOSEU 100 (A) 03/23/2021 2000   HGBUR NEGATIVE 03/23/2021 2000   BILIRUBINUR LARGE (A) 03/23/2021 2000   KETONESUR 15 (A) 03/23/2021 2000   PROTEINUR 30 (A) 03/23/2021 2000   NITRITE POSITIVE (A) 03/23/2021 2000   LEUKOCYTESUR TRACE (A) 03/23/2021 2000   Sepsis Labs Invalid input(s): PROCALCITONIN,  WBC,  LACTICIDVEN Microbiology Recent Results (from the past 240 hour(s))  Resp Panel by  RT-PCR (Flu A&B, Covid) Nasopharyngeal Swab     Status: None   Collection Time: 04/22/21 10:05 AM   Specimen: Nasopharyngeal Swab; Nasopharyngeal(NP) swabs in vial transport medium  Result Value Ref Range Status   SARS Coronavirus 2 by RT PCR NEGATIVE NEGATIVE Final    Comment: (NOTE) SARS-CoV-2 target nucleic acids are NOT DETECTED.  The SARS-CoV-2 RNA is generally detectable in upper respiratory specimens during the acute phase of infection. The lowest concentration of SARS-CoV-2 viral copies this assay can detect is 138 copies/mL. A negative result does not preclude SARS-Cov-2 infection and should not be used as the sole basis for treatment or other patient management decisions. A negative result may occur with  improper specimen collection/handling, submission of specimen other than nasopharyngeal swab, presence of viral mutation(s) within the areas targeted by this assay, and inadequate number of viral copies(<138 copies/mL). A negative result must be combined with clinical observations, patient history, and epidemiological information. The expected result is Negative.  Fact Sheet for Patients:  EntrepreneurPulse.com.au  Fact Sheet for Healthcare Providers:  IncredibleEmployment.be  This test is no t yet approved or cleared by the [place] FDA and  has been authorized for detection and/or diagnosis of SARS-CoV-2 by FDA under an Emergency Use Authorization (EUA). This EUA will remain  in effect (meaning this test can be used) for the duration of the COVID-19 declaration under Section 564(b)(1) of the Act, 21 U.S.C.section 360bbb-3(b)(1), unless the authorization is terminated  or revoked sooner.       Influenza A by PCR NEGATIVE NEGATIVE Final   Influenza B by PCR NEGATIVE NEGATIVE Final    Comment: (NOTE) The Xpert Xpress SARS-CoV-2/FLU/RSV plus assay is intended as an aid in the diagnosis of influenza from Nasopharyngeal swab  specimens and should not be used as a sole basis for treatment. Nasal washings and aspirates are unacceptable for Xpert Xpress SARS-CoV-2/FLU/RSV testing.  Fact Sheet for Patients: EntrepreneurPulse.com.au  Fact Sheet for Healthcare Providers: IncredibleEmployment.be  This test is not yet approved or cleared by the [place] FDA and has been authorized for detection and/or diagnosis of SARS-CoV-2 by FDA under an Emergency Use Authorization (EUA). This EUA will remain in effect (meaning this test can be used) for the duration of the COVID-19 declaration under Section 564(b)(1) of the Act, 21 U.S.C. section 360bbb-3(b)(1), unless the authorization is terminated or revoked.  Performed at [facility], [address], [place], [place]  [address]    Time spent: 74mn  SIGNED:   SMarylu Lund MD  [facility] 04/23/2021, 11:23 AM  If 7PM-7AM, please contact night-coverage

## 2021-05-03 ENCOUNTER — Encounter: Payer: Self-pay | Admitting: Gastroenterology

## 2021-05-03 ENCOUNTER — Ambulatory Visit: Payer: Self-pay | Admitting: Gastroenterology

## 2021-08-09 ENCOUNTER — Encounter (HOSPITAL_COMMUNITY): Payer: Self-pay | Admitting: Emergency Medicine

## 2021-08-09 ENCOUNTER — Other Ambulatory Visit: Payer: Self-pay

## 2021-08-09 ENCOUNTER — Inpatient Hospital Stay (HOSPITAL_COMMUNITY)
Admission: EM | Admit: 2021-08-09 | Discharge: 2021-08-28 | DRG: 872 | Disposition: A | Discharge status: Home / Self Care | Payer: Self-pay | Attending: Internal Medicine | Admitting: Internal Medicine

## 2021-08-09 DIAGNOSIS — I4892 Unspecified atrial flutter: Secondary | ICD-10-CM

## 2021-08-09 DIAGNOSIS — E440 Moderate protein-calorie malnutrition: Secondary | ICD-10-CM | POA: Insufficient documentation

## 2021-08-09 DIAGNOSIS — R0602 Shortness of breath: Secondary | ICD-10-CM

## 2021-08-09 DIAGNOSIS — I10 Essential (primary) hypertension: Secondary | ICD-10-CM | POA: Diagnosis present

## 2021-08-09 DIAGNOSIS — E669 Obesity, unspecified: Secondary | ICD-10-CM | POA: Diagnosis present

## 2021-08-09 DIAGNOSIS — K709 Alcoholic liver disease, unspecified: Secondary | ICD-10-CM | POA: Diagnosis present

## 2021-08-09 DIAGNOSIS — E871 Hypo-osmolality and hyponatremia: Secondary | ICD-10-CM | POA: Diagnosis present

## 2021-08-09 DIAGNOSIS — I471 Supraventricular tachycardia, unspecified: Secondary | ICD-10-CM

## 2021-08-09 DIAGNOSIS — R627 Adult failure to thrive: Secondary | ICD-10-CM | POA: Diagnosis present

## 2021-08-09 DIAGNOSIS — Z20822 Contact with and (suspected) exposure to covid-19: Secondary | ICD-10-CM | POA: Diagnosis present

## 2021-08-09 DIAGNOSIS — K56609 Unspecified intestinal obstruction, unspecified as to partial versus complete obstruction: Secondary | ICD-10-CM

## 2021-08-09 DIAGNOSIS — L0291 Cutaneous abscess, unspecified: Secondary | ICD-10-CM

## 2021-08-09 DIAGNOSIS — A419 Sepsis, unspecified organism: Principal | ICD-10-CM | POA: Diagnosis present

## 2021-08-09 DIAGNOSIS — Z79899 Other long term (current) drug therapy: Secondary | ICD-10-CM

## 2021-08-09 DIAGNOSIS — Z6834 Body mass index (BMI) 34.0-34.9, adult: Secondary | ICD-10-CM

## 2021-08-09 DIAGNOSIS — K433 Parastomal hernia with obstruction, without gangrene: Secondary | ICD-10-CM

## 2021-08-09 DIAGNOSIS — E860 Dehydration: Secondary | ICD-10-CM

## 2021-08-09 DIAGNOSIS — I82409 Acute embolism and thrombosis of unspecified deep veins of unspecified lower extremity: Secondary | ICD-10-CM

## 2021-08-09 DIAGNOSIS — Z0189 Encounter for other specified special examinations: Secondary | ICD-10-CM

## 2021-08-09 DIAGNOSIS — D6489 Other specified anemias: Secondary | ICD-10-CM | POA: Diagnosis present

## 2021-08-09 DIAGNOSIS — K5100 Ulcerative (chronic) pancolitis without complications: Secondary | ICD-10-CM

## 2021-08-09 DIAGNOSIS — E8809 Other disorders of plasma-protein metabolism, not elsewhere classified: Secondary | ICD-10-CM | POA: Diagnosis present

## 2021-08-09 DIAGNOSIS — F1010 Alcohol abuse, uncomplicated: Secondary | ICD-10-CM | POA: Diagnosis present

## 2021-08-09 DIAGNOSIS — E46 Unspecified protein-calorie malnutrition: Secondary | ICD-10-CM | POA: Diagnosis present

## 2021-08-09 DIAGNOSIS — Z4659 Encounter for fitting and adjustment of other gastrointestinal appliance and device: Secondary | ICD-10-CM

## 2021-08-09 DIAGNOSIS — E876 Hypokalemia: Secondary | ICD-10-CM

## 2021-08-09 DIAGNOSIS — K2920 Alcoholic gastritis without bleeding: Secondary | ICD-10-CM | POA: Diagnosis present

## 2021-08-09 DIAGNOSIS — R509 Fever, unspecified: Secondary | ICD-10-CM

## 2021-08-09 DIAGNOSIS — Z8711 Personal history of peptic ulcer disease: Secondary | ICD-10-CM

## 2021-08-09 MED ORDER — SODIUM CHLORIDE 0.9 % IV BOLUS
1000.0000 mL | Freq: Once | INTRAVENOUS | Status: AC
  Administered 2021-08-10: 1000 mL via INTRAVENOUS

## 2021-08-09 MED ORDER — LOPERAMIDE HCL 2 MG PO CAPS
4.0000 mg | ORAL_CAPSULE | Freq: Once | ORAL | Status: AC
  Administered 2021-08-10: 4 mg via ORAL
  Filled 2021-08-09: qty 2

## 2021-08-09 MED ORDER — ONDANSETRON HCL 4 MG/2ML IJ SOLN
4.0000 mg | Freq: Once | INTRAMUSCULAR | Status: AC
  Administered 2021-08-10: 4 mg via INTRAVENOUS
  Filled 2021-08-09: qty 2

## 2021-08-09 NOTE — ED Triage Notes (Signed)
Pt c/o n/v/d x 4 days. She states her ostomy is irritated.

## 2021-08-10 ENCOUNTER — Observation Stay (HOSPITAL_COMMUNITY): Payer: Self-pay

## 2021-08-10 ENCOUNTER — Emergency Department (HOSPITAL_COMMUNITY): Payer: Self-pay

## 2021-08-10 DIAGNOSIS — K5100 Ulcerative (chronic) pancolitis without complications: Secondary | ICD-10-CM

## 2021-08-10 DIAGNOSIS — E876 Hypokalemia: Secondary | ICD-10-CM

## 2021-08-10 DIAGNOSIS — K435 Parastomal hernia without obstruction or  gangrene: Secondary | ICD-10-CM

## 2021-08-10 DIAGNOSIS — E860 Dehydration: Secondary | ICD-10-CM | POA: Diagnosis present

## 2021-08-10 DIAGNOSIS — E46 Unspecified protein-calorie malnutrition: Secondary | ICD-10-CM

## 2021-08-10 DIAGNOSIS — E8809 Other disorders of plasma-protein metabolism, not elsewhere classified: Secondary | ICD-10-CM

## 2021-08-10 LAB — CBC WITH DIFFERENTIAL/PLATELET
Abs Immature Granulocytes: 0.15 10*3/uL — ABNORMAL HIGH (ref 0.00–0.07)
Band Neutrophils: 12 %
Basophils Absolute: 0 10*3/uL (ref 0.0–0.1)
Basophils Absolute: 0.1 10*3/uL (ref 0.0–0.1)
Basophils Relative: 0 %
Basophils Relative: 1 %
Eosinophils Absolute: 0 10*3/uL (ref 0.0–0.5)
Eosinophils Absolute: 0 10*3/uL (ref 0.0–0.5)
Eosinophils Relative: 0 %
Eosinophils Relative: 0 %
HCT: 36.9 % (ref 36.0–46.0)
HCT: 31.3 % — ABNORMAL LOW (ref 36.0–46.0)
Hemoglobin: 12.6 g/dL (ref 12.0–15.0)
Hemoglobin: 10.4 g/dL — ABNORMAL LOW (ref 12.0–15.0)
Immature Granulocytes: 1 %
Lymphocytes Relative: 5 %
Lymphocytes Relative: 6 %
Lymphs Abs: 0.5 10*3/uL — ABNORMAL LOW (ref 0.7–4.0)
Lymphs Abs: 0.6 10*3/uL — ABNORMAL LOW (ref 0.7–4.0)
MCH: 31 pg (ref 26.0–34.0)
MCH: 31.1 pg (ref 26.0–34.0)
MCHC: 34.1 g/dL (ref 30.0–36.0)
MCHC: 33.2 g/dL (ref 30.0–36.0)
MCV: 90.7 fL (ref 80.0–100.0)
MCV: 93.7 fL (ref 80.0–100.0)
Metamyelocytes Relative: 5 %
Monocytes Absolute: 1.2 10*3/uL — ABNORMAL HIGH (ref 0.1–1.0)
Monocytes Absolute: 0.8 10*3/uL (ref 0.1–1.0)
Monocytes Relative: 11 %
Monocytes Relative: 8 %
Myelocytes: 4 %
Neutro Abs: 9 10*3/uL — ABNORMAL HIGH (ref 1.7–7.7)
Neutro Abs: 7.9 10*3/uL — ABNORMAL HIGH (ref 1.7–7.7)
Neutrophils Relative %: 83 %
Neutrophils Relative %: 64 %
Platelets: 316 10*3/uL (ref 150–400)
Platelets: 295 10*3/uL (ref 150–400)
RBC: 4.07 MIL/uL (ref 3.87–5.11)
RBC: 3.34 MIL/uL — ABNORMAL LOW (ref 3.87–5.11)
RDW: 14.6 % (ref 11.5–15.5)
RDW: 15 % (ref 11.5–15.5)
WBC: 10.8 10*3/uL — ABNORMAL HIGH (ref 4.0–10.5)
WBC: 10.4 10*3/uL (ref 4.0–10.5)
nRBC: 0 % (ref 0.0–0.2)
nRBC: 0 % (ref 0.0–0.2)

## 2021-08-10 LAB — CBC
HCT: 29.3 % — ABNORMAL LOW (ref 36.0–46.0)
Hemoglobin: 9.5 g/dL — ABNORMAL LOW (ref 12.0–15.0)
MCH: 31 pg (ref 26.0–34.0)
MCHC: 32.4 g/dL (ref 30.0–36.0)
MCV: 95.8 fL (ref 80.0–100.0)
Platelets: 253 10*3/uL (ref 150–400)
RBC: 3.06 MIL/uL — ABNORMAL LOW (ref 3.87–5.11)
RDW: 15.1 % (ref 11.5–15.5)
WBC: 8.5 10*3/uL (ref 4.0–10.5)
nRBC: 0 % (ref 0.0–0.2)

## 2021-08-10 LAB — COMPREHENSIVE METABOLIC PANEL
ALT: 25 U/L (ref 0–44)
ALT: 21 U/L (ref 0–44)
AST: 42 U/L — ABNORMAL HIGH (ref 15–41)
AST: 32 U/L (ref 15–41)
Albumin: 2.9 g/dL — ABNORMAL LOW (ref 3.5–5.0)
Albumin: 2.3 g/dL — ABNORMAL LOW (ref 3.5–5.0)
Alkaline Phosphatase: 124 U/L (ref 38–126)
Alkaline Phosphatase: 97 U/L (ref 38–126)
Anion gap: 14 (ref 5–15)
Anion gap: 11 (ref 5–15)
BUN: 11 mg/dL (ref 6–20)
BUN: 8 mg/dL (ref 6–20)
CO2: 23 mmol/L (ref 22–32)
CO2: 21 mmol/L — ABNORMAL LOW (ref 22–32)
Calcium: 8.9 mg/dL (ref 8.9–10.3)
Calcium: 7.6 mg/dL — ABNORMAL LOW (ref 8.9–10.3)
Chloride: 94 mmol/L — ABNORMAL LOW (ref 98–111)
Chloride: 98 mmol/L (ref 98–111)
Creatinine, Ser: 0.48 mg/dL (ref 0.44–1.00)
Creatinine, Ser: 0.46 mg/dL (ref 0.44–1.00)
GFR, Estimated: >60 mL/min (ref >60)
GFR, Estimated: >60 mL/min (ref >60)
Glucose, Bld: 134 mg/dL — ABNORMAL HIGH (ref 70–99)
Glucose, Bld: 86 mg/dL (ref 70–99)
Potassium: 2.6 mmol/L — CL (ref 3.5–5.1)
Potassium: 2.8 mmol/L — ABNORMAL LOW (ref 3.5–5.1)
Sodium: 131 mmol/L — ABNORMAL LOW (ref 135–145)
Sodium: 130 mmol/L — ABNORMAL LOW (ref 135–145)
Total Bilirubin: 1.1 mg/dL (ref 0.3–1.2)
Total Bilirubin: 1 mg/dL (ref 0.3–1.2)
Total Protein: 7.3 g/dL (ref 6.5–8.1)
Total Protein: 6 g/dL — ABNORMAL LOW (ref 6.5–8.1)

## 2021-08-10 LAB — MRSA NEXT GEN BY PCR, NASAL: MRSA by PCR Next Gen: NOT DETECTED

## 2021-08-10 LAB — C DIFFICILE QUICK SCREEN W PCR REFLEX
C Diff antigen: NEGATIVE
C Diff interpretation: NOT DETECTED
C Diff toxin: NEGATIVE

## 2021-08-10 LAB — LIPASE, BLOOD: Lipase: 25 U/L (ref 11–51)

## 2021-08-10 LAB — RESP PANEL BY RT-PCR (FLU A&B, COVID) ARPGX2
Influenza A by PCR: NEGATIVE
Influenza B by PCR: NEGATIVE
SARS Coronavirus 2 by RT PCR: NEGATIVE

## 2021-08-10 LAB — POC OCCULT BLOOD, ED: Fecal Occult Bld: POSITIVE — AB

## 2021-08-10 LAB — MAGNESIUM
Magnesium: 1.7 mg/dL (ref 1.7–2.4)
Magnesium: 1.6 mg/dL — ABNORMAL LOW (ref 1.7–2.4)

## 2021-08-10 LAB — LACTIC ACID, PLASMA
Lactic Acid, Venous: 0.7 mmol/L (ref 0.5–1.9)
Lactic Acid, Venous: 4.1 mmol/L (ref 0.5–1.9)

## 2021-08-10 LAB — PROCALCITONIN: Procalcitonin: 3.01 ng/mL

## 2021-08-10 MED ORDER — POTASSIUM CHLORIDE 10 MEQ/100ML IV SOLN
10.0000 meq | INTRAVENOUS | Status: AC
  Administered 2021-08-10 (×3): 10 meq via INTRAVENOUS
  Filled 2021-08-10 (×3): qty 100

## 2021-08-10 MED ORDER — DIPHENHYDRAMINE HCL 50 MG/ML IJ SOLN
25.0000 mg | Freq: Three times a day (TID) | INTRAMUSCULAR | Status: DC | PRN
  Administered 2021-08-10 – 2021-08-28 (×13): 25 mg via INTRAVENOUS
  Filled 2021-08-10 (×13): qty 1

## 2021-08-10 MED ORDER — ACETAMINOPHEN 325 MG PO TABS
650.0000 mg | ORAL_TABLET | Freq: Four times a day (QID) | ORAL | Status: DC | PRN
  Administered 2021-08-10 – 2021-08-26 (×15): 650 mg via ORAL
  Filled 2021-08-10 (×15): qty 2

## 2021-08-10 MED ORDER — SODIUM CHLORIDE 0.9 % IV BOLUS
1000.0000 mL | Freq: Once | INTRAVENOUS | Status: AC
  Administered 2021-08-10: 15:00:00 1000 mL via INTRAVENOUS

## 2021-08-10 MED ORDER — OXYCODONE HCL 5 MG PO TABS: 5.0000 mg | ORAL_TABLET | ORAL | Status: DC | PRN

## 2021-08-10 MED ORDER — CHLORHEXIDINE GLUCONATE CLOTH 2 % EX PADS
6.0000 | MEDICATED_PAD | Freq: Every day | CUTANEOUS | Status: DC
  Administered 2021-08-10 – 2021-08-28 (×19): 6 via TOPICAL

## 2021-08-10 MED ORDER — MAGNESIUM SULFATE 2 GM/50ML IV SOLN
2.0000 g | Freq: Once | INTRAVENOUS | Status: AC
  Administered 2021-08-10: 08:00:00 2 g via INTRAVENOUS
  Filled 2021-08-10: qty 50

## 2021-08-10 MED ORDER — MORPHINE SULFATE (PF) 4 MG/ML IV SOLN
4.0000 mg | INTRAVENOUS | Status: DC | PRN
  Administered 2021-08-10 – 2021-08-15 (×16): 4 mg via INTRAVENOUS
  Filled 2021-08-10 (×16): qty 1

## 2021-08-10 MED ORDER — POTASSIUM CHLORIDE 10 MEQ/100ML IV SOLN
10.0000 meq | INTRAVENOUS | Status: AC
  Administered 2021-08-10 (×6): 10 meq via INTRAVENOUS
  Filled 2021-08-10 (×6): qty 100

## 2021-08-10 MED ORDER — CIPROFLOXACIN IN D5W 400 MG/200ML IV SOLN
400.0000 mg | Freq: Two times a day (BID) | INTRAVENOUS | Status: DC
  Administered 2021-08-10 – 2021-08-19 (×18): 400 mg via INTRAVENOUS
  Filled 2021-08-10 (×18): qty 200

## 2021-08-10 MED ORDER — HEPARIN SODIUM (PORCINE) 5000 UNIT/ML IJ SOLN
5000.0000 [IU] | Freq: Three times a day (TID) | INTRAMUSCULAR | Status: DC
  Administered 2021-08-10 – 2021-08-28 (×55): 5000 [IU] via SUBCUTANEOUS
  Filled 2021-08-10 (×56): qty 1

## 2021-08-10 MED ORDER — LOPERAMIDE HCL 2 MG PO CAPS: 2.0000 mg | ORAL_CAPSULE | ORAL | Status: DC | PRN

## 2021-08-10 MED ORDER — SODIUM CHLORIDE 0.9 % IV BOLUS
1000.0000 mL | Freq: Once | INTRAVENOUS | Status: AC
  Administered 2021-08-10: 02:00:00 1000 mL via INTRAVENOUS

## 2021-08-10 MED ORDER — ONDANSETRON HCL 4 MG PO TABS
4.0000 mg | ORAL_TABLET | Freq: Four times a day (QID) | ORAL | Status: DC | PRN
  Administered 2021-08-17 – 2021-08-27 (×2): 4 mg via ORAL
  Filled 2021-08-10 (×2): qty 1

## 2021-08-10 MED ORDER — SODIUM CHLORIDE 0.9 % IV BOLUS
500.0000 mL | Freq: Once | INTRAVENOUS | Status: AC
Start: 2021-08-10 — End: 2021-08-10
  Administered 2021-08-10: 21:00:00 500 mL via INTRAVENOUS

## 2021-08-10 MED ORDER — FOLIC ACID 1 MG PO TABS: 1.0000 mg | ORAL_TABLET | Freq: Every day | ORAL | Status: DC

## 2021-08-10 MED ORDER — ACETAMINOPHEN 650 MG RE SUPP: 650.0000 mg | Freq: Four times a day (QID) | RECTAL | Status: DC | PRN

## 2021-08-10 MED ORDER — HYDROXYZINE HCL 10 MG PO TABS
10.0000 mg | ORAL_TABLET | Freq: Three times a day (TID) | ORAL | Status: DC | PRN
  Administered 2021-08-10 – 2021-08-16 (×6): 10 mg via ORAL
  Filled 2021-08-10 (×8): qty 1

## 2021-08-10 MED ORDER — FOLIC ACID 1 MG PO TABS
1.0000 mg | ORAL_TABLET | Freq: Every day | ORAL | Status: DC
  Administered 2021-08-10 – 2021-08-16 (×6): 1 mg via ORAL
  Filled 2021-08-10 (×7): qty 1

## 2021-08-10 MED ORDER — ONDANSETRON HCL 4 MG/2ML IJ SOLN
4.0000 mg | Freq: Four times a day (QID) | INTRAMUSCULAR | Status: DC | PRN
  Administered 2021-08-10 – 2021-08-26 (×18): 4 mg via INTRAVENOUS
  Filled 2021-08-10 (×18): qty 2

## 2021-08-10 MED ORDER — METRONIDAZOLE 500 MG/100ML IV SOLN
500.0000 mg | Freq: Three times a day (TID) | INTRAVENOUS | Status: DC
  Administered 2021-08-10 – 2021-08-19 (×26): 500 mg via INTRAVENOUS
  Filled 2021-08-10 (×26): qty 100

## 2021-08-10 MED ORDER — MORPHINE SULFATE (PF) 4 MG/ML IV SOLN
4.0000 mg | Freq: Once | INTRAVENOUS | Status: AC
  Administered 2021-08-10: 01:00:00 4 mg via INTRAVENOUS
  Filled 2021-08-10: qty 1

## 2021-08-10 MED ORDER — ADULT MULTIVITAMIN W/MINERALS CH
1.0000 | ORAL_TABLET | Freq: Every day | ORAL | Status: DC
  Administered 2021-08-10 – 2021-08-16 (×6): 1 via ORAL
  Filled 2021-08-10 (×7): qty 1

## 2021-08-10 MED ORDER — IOHEXOL 300 MG/ML  SOLN
100.0000 mL | Freq: Once | INTRAMUSCULAR | Status: AC | PRN
  Administered 2021-08-10: 02:00:00 100 mL via INTRAVENOUS

## 2021-08-10 MED ORDER — THIAMINE HCL 100 MG PO TABS
100.0000 mg | ORAL_TABLET | Freq: Every day | ORAL | Status: DC
  Administered 2021-08-10 – 2021-08-16 (×6): 100 mg via ORAL
  Filled 2021-08-10 (×7): qty 1

## 2021-08-10 MED ORDER — PANTOPRAZOLE SODIUM 40 MG IV SOLR
40.0000 mg | Freq: Two times a day (BID) | INTRAVENOUS | Status: DC
  Administered 2021-08-10 – 2021-08-27 (×34): 40 mg via INTRAVENOUS
  Filled 2021-08-10 (×3): qty 40
  Filled 2021-08-10: qty 10
  Filled 2021-08-10 (×2): qty 40
  Filled 2021-08-10: qty 10
  Filled 2021-08-10 (×8): qty 40
  Filled 2021-08-10 (×2): qty 10
  Filled 2021-08-10: qty 40
  Filled 2021-08-10: qty 10
  Filled 2021-08-10 (×9): qty 40
  Filled 2021-08-10 (×3): qty 10
  Filled 2021-08-10 (×3): qty 40

## 2021-08-10 MED ORDER — SODIUM CHLORIDE 0.9 % IV SOLN: INTRAVENOUS | Status: DC

## 2021-08-10 MED ORDER — LORAZEPAM 1 MG PO TABS: 1.0000 mg | ORAL_TABLET | ORAL | Status: AC | PRN

## 2021-08-10 MED ORDER — LORAZEPAM 2 MG/ML IJ SOLN
1.0000 mg | INTRAMUSCULAR | Status: AC | PRN
  Administered 2021-08-11: 2 mg via INTRAVENOUS
  Filled 2021-08-10: qty 1

## 2021-08-10 MED ORDER — THIAMINE HCL 100 MG/ML IJ SOLN
100.0000 mg | Freq: Every day | INTRAMUSCULAR | Status: DC
  Administered 2021-08-18: 100 mg via INTRAVENOUS
  Filled 2021-08-10 (×2): qty 2

## 2021-08-10 NOTE — H&P (Signed)
TRH H&P    Patient Demographics:    Alyssa Carey, is a 51 y.o. female  MRN: [id number]  DOB - [birth year]  Admit Date - 08/09/2021  Referring MD/NP/PA: Dina Rich  Outpatient Primary MD for the patient is [facility]  Patient coming from: Home  Chief complaint- high ostomy output   HPI:    Alyssa Carey  is a 51 y.o. 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  Patient reports that she thought she had the flu because she been having nausea and vomiting and then high ostomy output.  The nausea and vomiting started about 4 or 5 days ago.  Nonbloody emesis.  She tried Pepto and had no relief.  She then started having very high ostomy output.  She reports she was changing her ostomy bag 3-5 times a day.  This made the surrounding area very irritated.  She felt like her abdomen was on fire.  Her abdomen is diffusely sore due to the retching, but the burning feeling was surrounding the ostomy site.  Patient denies any blood in her ostomy output.  She has no known fevers.  She does report generalized weakness, and pain with deep inspiration.  She reports that this pain started after her episodes of nausea and vomiting.  Likely costochondritis from retching.  Patient reports her last normal meal was 2 or 3 weeks ago.  She had been improving her p.o. intake per her report and then it just closed off that time.  She reports she does take boost, Ensure, and tries to eat crackers or bread each day.  She has had weight loss over the last week.  She denies any cough or shortness of breath.  Her only other complaint is ear pressure that is been going on for 2 or 3 weeks in her left ear.  He had no change in hearing.  She put some oil in her ear and it seemed to help.  Patient has not been on any antibiotics  recently.  She has not had any sick contacts.  Patient has no further complaints at this time.  Patient does not smoke, does not drink alcohol, does not use illicit drugs.  She is vaccinated for COVID.  Patient is full code.  In the ED Temp 99.6, heart rate 114-136, respiratory rate 18-41, blood pressure 105/81, satting 96% Leukocytosis 10.8, hemoglobin 12.6 Slight hyponatremia 131, hypokalemia 2.6 Negative respiratory panel Lipase 25, alk phos 124 CT abdomen pelvis shows pancolitis with no bowel obstruction normal appendix EKG shows sinus tach QTC of 447 and a heart rate of 111 Patient was given Imodium, morphine, Zofran, 30 mEq of potassium, 2 L normal saline bolus The 2 L normal saline bolus her heart rate improved from 130s to the 1 teens, blood pressure has been stable Admission requested for dehydration and hypokalemia    Review of systems:    In addition to the HPI above,  No Fever-chills, No Headache,  No changes with Vision or hearing, No problems swallowing food or Liquids, Admits to musculoskeletal chest pain no cough or Shortness of Breath, No Blood in urine but does admit to melanotic stools No dysuria, No new skin rashes or bruises, No new joints pains-aches,  No new asymmetric weakness, tingling, numbness in any extremity, No recent weight gain or loss, No polyuria, polydypsia or polyphagia, No significant Mental Stressors.  All other systems reviewed and are negative.    Past History of the following :    Past Medical History:  Diagnosis Date   HTN (hypertension) 10/05/2020   Necrotizing fasciitis [facility])       Past Surgical History:  Procedure Laterality Date   BIOPSY  10/07/2020   Procedure: BIOPSY;  Surgeon: Daneil Dolin, MD;  [facility];  Service: Endoscopy;;   COLONOSCOPY WITH PROPOFOL N/A 10/07/2020   single healing rectal ulcer in distal rectum with surrounding mucosal friability and markedly abnormal proximal colon with ulceration  s/p biopsies.   COLONOSCOPY WITH PROPOFOL  02/22/2021   Procedure: COLONOSCOPY WITH PROPOFOL;  Surgeon: [name], DO;  [facility];  Service: Endoscopy;;   ESOPHAGOGASTRODUODENOSCOPY (EGD) WITH PROPOFOL N/A 10/07/2020   non-bleeding gastric ulcer . Pathology with H.pylori negative   ESOPHAGOGASTRODUODENOSCOPY (EGD) WITH PROPOFOL N/A 12/27/2020   Procedure: ESOPHAGOGASTRODUODENOSCOPY (EGD) WITH PROPOFOL;  Surgeon: [name], DO;  [facility];  Service: Endoscopy;  Laterality: N/A;  1:00pm   FLEXIBLE SIGMOIDOSCOPY N/A 01/11/2020   Procedure: FLEXIBLE SIGMOIDOSCOPY;  Surgeon: Carol Ada, MD;  Location: [facility];  Service: Gastroenterology;  Laterality: N/A;   INCISION AND DRAINAGE PERIRECTAL ABSCESS N/A 12/25/2019   Procedure: IRRIGATION AND DEBRIDEMENT BUTTOCKS, LAP LOOP COLOSTOMY;  Surgeon: Greer Pickerel, MD;  Location: [facility];  Service: General;  Laterality: N/A;   IRRIGATION AND DEBRIDEMENT ABSCESS N/A 12/23/2019   Procedure: EXCISION AND DEBRIDEMENT LEFT BUTTOCK AND PERINEUM;  Surgeon: Clovis Riley, MD;  Location: [facility];  Service: General;  Laterality: N/A;   LAPAROSCOPIC LOOP COLOSTOMY N/A 12/25/2019   Procedure: LAPAROSCOPIC LOOP COLOSTOMY;  Surgeon: Greer Pickerel, MD;  Location: [facility];  Service: General;  Laterality: N/A;      Social History:      Social History   Tobacco Use   Smoking status: Never   Smokeless tobacco: Never  Substance Use Topics   Alcohol use: Not Currently       Family History :     Family History  Problem Relation Age of Onset   Colon polyps Mother        does not believe adenomas   Colon cancer Neg Hx       Home Medications:   Prior to Admission medications   Medication Sig Start Date End Date Taking? Authorizing Provider  acetaminophen (TYLENOL) 325 MG tablet Take 325 mg by mouth every 6 (six) hours as needed.    [provider]  cholecalciferol (VITAMIN D3) 25 MCG (1000 UNIT) tablet Take  1,000 Units by mouth daily.    [provider]  famotidine (PEPCID) 20 MG tablet Take 1 tablet (20 mg total) by mouth 2 (two) times daily. Patient not taking: Reported on 04/21/2021 03/29/21 04/28/21  Barb Merino, MD  folic acid (FOLVITE) 1 MG tablet Take 1 tablet (1 mg total) by mouth daily. 04/12/21   Aline August, MD  furosemide (LASIX) 40 MG tablet Take 1 tablet (40 mg total) by mouth daily. 04/23/21 05/23/21  Donne Hazel, MD  hydrOXYzine (  ATARAX/VISTARIL) 10 MG tablet Take 1 tablet (10 mg total) by mouth 3 (three) times daily as needed for itching. 04/23/21   Donne Hazel, MD  Nutritional Supplements (BOOST PO) Take 1 Bottle by mouth 4 (four) times daily.    [provider]  vitamin C (ASCORBIC ACID) 500 MG tablet Take 500 mg by mouth daily.    [provider]     Allergies:     Allergies  Allergen Reactions   Pantoprazole     Stomach pain   Penicillins Hives    Did it involve swelling of the face/tongue/throat, SOB, or low BP? N Did it involve sudden or severe rash/hives, skin peeling, or any reaction on the inside of your mouth or nose? Y Did you need to seek medical attention at a hospital or doctor's office? N When did it last happen?  Childhood     If all above answers are "NO", may proceed with cephalosporin use.    Oxycodone Hcl Rash     Physical Exam:   Vitals  Blood pressure 98/70, pulse (!) 108, temperature 98.8 F (37.1 C), resp. rate (!) 32, height _0  (1.473 m), weight 75.8 kg, SpO2 99 %.   1.  General: Patient lying supine in bed,  no acute distress   2. Psychiatric: Alert and oriented x 3, mood and behavior normal for situation, pleasant and cooperative with exam   3. Neurologic: Speech and language are normal, face is symmetric, moves all 4 extremities voluntarily, at baseline without acute deficits on limited exam   4. HEENMT:  Head is atraumatic, normocephalic, pupils reactive to light, neck is supple, trachea is  midline, mucous membranes are moist   5. Respiratory : Lungs are clear to auscultation bilaterally without wheezing, rhonchi, rales, no cyanosis, no increase in work of breathing or accessory muscle use   6. Cardiovascular : Heart rate tachycardic, rhythm is regular, no murmurs, rubs or gallops, no peripheral edema, peripheral pulses palpated   7. Gastrointestinal:  Hernia surrounding ostomy site, no blood in ostomy bag, diffuse tenderness with exquisite tenderness running the hernia at the ostomy site, no guarding   8. Skin:  Skin is warm, dry and intact without rashes, acute lesions, or ulcers on limited exam   9.Musculoskeletal:  No acute deformities or trauma, no asymmetry in tone, no peripheral edema, peripheral pulses palpated, no tenderness to palpation in the extremities     Data Review:    CBC Recent Labs  Lab 08/10/21 0011  WBC 10.8*  HGB 12.6  HCT 36.9  PLT 316  MCV 90.7  MCH 31.0  MCHC 34.1  RDW 14.6  LYMPHSABS 0.5*  MONOABS 1.2*  EOSABS 0.0  BASOSABS 0.0   ------------------------------------------------------------------------------------------------------------------  Results for orders placed or performed during the hospital encounter of 08/09/21 (from the past 48 hour(s))  Resp Panel by RT-PCR (Flu A&B, Covid) Nasopharyngeal Swab     Status: None   Collection Time: 08/09/21 12:11 AM   Specimen: Nasopharyngeal Swab; Nasopharyngeal(NP) swabs in vial transport medium  Result Value Ref Range   SARS Coronavirus 2 by RT PCR NEGATIVE NEGATIVE    Comment: (NOTE) SARS-CoV-2 target nucleic acids are NOT DETECTED.  The SARS-CoV-2 RNA is generally detectable in upper respiratory specimens during the acute phase of infection. The lowest concentration of SARS-CoV-2 viral copies this assay can detect is 138 copies/mL. A negative result does not preclude SARS-Cov-2 infection and should not be used as the sole basis for treatment or other patient  management  decisions. A negative result may occur with  improper specimen collection/handling, submission of specimen other than nasopharyngeal swab, presence of viral mutation(s) within the areas targeted by this assay, and inadequate number of viral copies(<138 copies/mL). A negative result must be combined with clinical observations, patient history, and epidemiological information. The expected result is Negative.  Fact Sheet for Patients:  EntrepreneurPulse.com.au  Fact Sheet for Healthcare Providers:  IncredibleEmployment.be  This test is no t yet approved or cleared by the [place] FDA and  has been authorized for detection and/or diagnosis of SARS-CoV-2 by FDA under an Emergency Use Authorization (EUA). This EUA will remain  in effect (meaning this test can be used) for the duration of the COVID-19 declaration under Section 564(b)(1) of the Act, 21 U.S.C.section 360bbb-3(b)(1), unless the authorization is terminated  or revoked sooner.       Influenza A by PCR NEGATIVE NEGATIVE   Influenza B by PCR NEGATIVE NEGATIVE    Comment: (NOTE) The Xpert Xpress SARS-CoV-2/FLU/RSV plus assay is intended as an aid in the diagnosis of influenza from Nasopharyngeal swab specimens and should not be used as a sole basis for treatment. Nasal washings and aspirates are unacceptable for Xpert Xpress SARS-CoV-2/FLU/RSV testing.  Fact Sheet for Patients: EntrepreneurPulse.com.au  Fact Sheet for Healthcare Providers: IncredibleEmployment.be  This test is not yet approved or cleared by the [place] FDA and has been authorized for detection and/or diagnosis of SARS-CoV-2 by FDA under an Emergency Use Authorization (EUA). This EUA will remain in effect (meaning this test can be used) for the duration of the COVID-19 declaration under Section 564(b)(1) of the Act, 21 U.S.C. section 360bbb-3(b)(1), unless the authorization  is terminated or revoked.  Performed at [facility], [address], [place], [place] [address]   CBC with Differential     Status: Abnormal   Collection Time: 08/10/21 12:11 AM  Result Value Ref Range   WBC 10.8 (H) 4.0 - 10.5 K/uL   RBC 4.07 3.87 - 5.11 MIL/uL   Hemoglobin 12.6 12.0 - 15.0 g/dL   HCT 36.9 36.0 - 46.0 %   MCV 90.7 80.0 - 100.0 fL   MCH 31.0 26.0 - 34.0 pg   MCHC 34.1 30.0 - 36.0 g/dL   RDW 14.6 11.5 - 15.5 %   Platelets 316 150 - 400 K/uL   nRBC 0.0 0.0 - 0.2 %   Neutrophils Relative % 83 %   Neutro Abs 9.0 (H) 1.7 - 7.7 K/uL   Lymphocytes Relative 5 %   Lymphs Abs 0.5 (L) 0.7 - 4.0 K/uL   Monocytes Relative 11 %   Monocytes Absolute 1.2 (H) 0.1 - 1.0 K/uL   Eosinophils Relative 0 %   Eosinophils Absolute 0.0 0.0 - 0.5 K/uL   Basophils Relative 0 %   Basophils Absolute 0.0 0.0 - 0.1 K/uL   WBC Morphology DOHLE BODIES     Comment: TOXIC GRANULATION FEW BANDS PRESENT    RBC Morphology MORPHOLOGY UNREMARKABLE    Smear Review MORPHOLOGY UNREMARKABLE    Immature Granulocytes 1 %   Abs Immature Granulocytes 0.15 (H) 0.00 - 0.07 K/uL    Comment: Performed at [facility], [address] [place], [place] [address]  Comprehensive metabolic panel     Status: Abnormal   Collection Time: 08/10/21 12:11 AM  Result Value Ref Range   Sodium 131 (L) 135 - 145 mmol/L   Potassium 2.6 (LL) 3.5 - 5.1 mmol/L    Comment: CRITICAL RESULT CALLED TO, READ  BACK BY AND VERIFIED WITH: Hicks,W_0  by matthews, b 1.25.23    Chloride 94 (L) 98 - 111 mmol/L   CO2 23 22 - 32 mmol/L   Glucose, Bld 134 (H) 70 - 99 mg/dL    Comment: Glucose reference range applies only to samples taken after fasting for at least 8 hours.   BUN 11 6 - 20 mg/dL   Creatinine, Ser 0.48 0.44 - 1.00 mg/dL   Calcium 8.9 8.9 - 10.3 mg/dL   Total Protein 7.3 6.5 - 8.1 g/dL   Albumin 2.9 (L) 3.5 - 5.0 g/dL   AST 42 (H) 15 - 41 U/L   ALT 25 0 - 44 U/L   Alkaline Phosphatase 124 38 - 126 U/L   Total  Bilirubin 1.1 0.3 - 1.2 mg/dL   GFR, Estimated >60 >60 mL/min    Comment: (NOTE) Calculated using the CKD-EPI Creatinine Equation (2021)    Anion gap 14 5 - 15    Comment: Performed at [facility], [address], [place], [place] [address]  Lipase, blood     Status: None   Collection Time: 08/10/21 12:11 AM  Result Value Ref Range   Lipase 25 11 - 51 U/L    Comment: Performed at [facility], [address], [place], [place] [address]  Magnesium     Status: None   Collection Time: 08/10/21 12:11 AM  Result Value Ref Range   Magnesium 1.7 1.7 - 2.4 mg/dL    Comment: Performed at [facility], [address], [place], [place] [address]  POC occult blood, ED     Status: Abnormal   Collection Time: 08/10/21 12:15 AM  Result Value Ref Range   Fecal Occult Bld POSITIVE (A) NEGATIVE  Lactic acid, plasma     Status: None   Collection Time: 08/10/21  3:59 AM  Result Value Ref Range   Lactic Acid, Venous 0.7 0.5 - 1.9 mmol/L    Comment: Performed at [facility], [address], [place],  [address]    Chemistries  Recent Labs  Lab 08/10/21 0011  NA 131*  K 2.6*  CL 94*  CO2 23  GLUCOSE 134*  BUN 11  CREATININE 0.48  CALCIUM 8.9  MG 1.7  AST 42*  ALT 25  ALKPHOS 124  BILITOT 1.1   ------------------------------------------------------------------------------------------------------------------  ------------------------------------------------------------------------------------------------------------------ GFR: Estimated Creatinine Clearance: 74.5 mL/min (by C-G formula based on SCr of 0.48 mg/dL). Liver Function Tests: Recent Labs  Lab 08/10/21 0011  AST 42*  ALT 25  ALKPHOS 124  BILITOT 1.1  PROT 7.3  ALBUMIN 2.9*   Recent Labs  Lab 08/10/21 0011  LIPASE 25   No results for input(s): AMMONIA in the last 168 hours. Coagulation Profile: No results for input(s): INR, PROTIME in the last 168 hours. Cardiac Enzymes: No results for input(s): CKTOTAL,  CKMB, CKMBINDEX, TROPONINI in the last 168 hours. BNP (last 3 results) No results for input(s): PROBNP in the last 8760 hours. HbA1C: No results for input(s): HGBA1C in the last 72 hours. CBG: No results for input(s): GLUCAP in the last 168 hours. Lipid Profile: No results for input(s): CHOL, HDL, LDLCALC, TRIG, CHOLHDL, LDLDIRECT in the last 72 hours. Thyroid Function Tests: No results for input(s): TSH, T4TOTAL, FREET4, T3FREE, THYROIDAB in the last 72 hours. Anemia Panel: No results for input(s): VITAMINB12, FOLATE, FERRITIN, TIBC, IRON, RETICCTPCT in the last 72 hours.  --------------------------------------------------------------------------------------------------------------- Urine analysis:    Component Value Date/Time   COLORURINE BROWN (A) 03/23/2021 2000  APPEARANCEUR CLEAR 03/23/2021 2000   LABSPEC 1.025 03/23/2021 2000   PHURINE 6.5 03/23/2021 2000   GLUCOSEU 100 (A) 03/23/2021 2000   HGBUR NEGATIVE 03/23/2021 2000   BILIRUBINUR LARGE (A) 03/23/2021 2000   KETONESUR 15 (A) 03/23/2021 2000   PROTEINUR 30 (A) 03/23/2021 2000   NITRITE POSITIVE (A) 03/23/2021 2000   LEUKOCYTESUR TRACE (A) 03/23/2021 2000      Imaging Results:    CT ABDOMEN PELVIS W CONTRAST  Result Date: 08/10/2021 CLINICAL DATA:  Nausea vomiting. EXAM: CT ABDOMEN AND PELVIS WITH CONTRAST TECHNIQUE: Multidetector CT imaging of the abdomen and pelvis was performed using the standard protocol following bolus administration of intravenous contrast. RADIATION DOSE REDUCTION: This exam was performed according to the departmental dose-optimization program which includes automated exposure control, adjustment of the mA and/or kV according to patient size and/or use of iterative reconstruction technique. CONTRAST:  133m OMNIPAQUE IOHEXOL 300 MG/ML  SOLN COMPARISON:  CT abdomen pelvis dated 04/09/2021. FINDINGS: Lower chest: The visualized lung bases are clear. No intra-abdominal free air or free fluid.  Hepatobiliary: Diffuse fatty liver. No intrahepatic biliary dilatation. Small gallstone. No pericholecystic fluid or evidence of acute cholecystitis by CT. Pancreas: Unremarkable. No pancreatic ductal dilatation or surrounding inflammatory changes. Spleen: Normal in size without focal abnormality. Adrenals/Urinary Tract: The adrenal glands unremarkable. There is no hydronephrosis on either side. There is symmetric enhancement and excretion of contrast by both kidneys. The visualized ureters and urinary bladder appear unremarkable. 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 There is diffuse inflammatory changes and thickening of the colon consistent with colitis. There is no bowel obstruction. The appendix is normal. Vascular/Lymphatic: The abdominal aorta and IVC are unremarkable. No portal venous gas. Small scattered mesenteric lymph nodes. No adenopathy. Reproductive: The uterus is anteverted.  No adnexal masses. Other: Anterior pelvic wall incisional scar. Musculoskeletal: No acute osseous pathology. IMPRESSION: 1. Pancolitis. No bowel obstruction. Normal appendix. 2. Fatty liver. 3. Cholelithiasis. Electronically Signed   By: AAnner CreteM.D.   On: 08/10/2021 02:25      Assessment & Plan:    Principal Problem:   Dehydration Active Problems:   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CC)   Hypokalemia   Hypoalbuminemia due to protein-calorie malnutrition (HCC)   Dehydration  secondary to high ostomy output As evidenced by soft pressures and tachycardia 2 L bolus in the ED Continue maintenance fluids Continue to monitor Pancolitis Questionable inflammatory versus infectious No leukocytosis, afebrile, no antibiotics started at this time Procalcitonin pending N.p.o. except for sips and chips Continue pain control with morphine Continue Imodium Continue to monitor Hypokalemia Secondary to GI losses Replete and recheck Hypoalbuminemia due to protein calorie  malnutrition Moderate protein calorie malnutrition with an albumin of 2.9 Secondary to poor p.o. intake After resolution of colitis, encourage nutrient dense p.o. intake FOBT positive Hemoglobin is better than baseline-likely hemoconcentrated given dehydration Trend hemoglobin in the afternoon, if decreasing consult GI-otherwise patient can probably do an outpatient follow-up   DVT Prophylaxis-   Heparin - SCDs   AM Labs Ordered, also please review Full Orders  Family Communication: No family at bedside Code Status: Full  Admission status: Observation  Disposition: Anticipated Discharge date 24 to 48 hours discharge to home  Time spent in minutes : 6[place]

## 2021-08-10 NOTE — Progress Notes (Signed)
Patient BPs running low 70-80s/50-60s, patient asymptomatic, current BP 82/64, MD notified

## 2021-08-10 NOTE — Progress Notes (Signed)
Alyssa Carey  is a 51 y.o. 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  Patient was admitted with pancolitis and associated dehydration as well as electrolyte abnormalities with hypokalemia and hypomagnesemia in the setting of ongoing high output/diarrhea.  She has been started on ciprofloxacin and Flagyl empirically on 1/25 and GI pathogen panel as well as C. difficile testing has been ordered.  She continues to require aggressive IV fluid hydration at this time and will be started on clear liquid diet.  Pancolitis -Started on ciprofloxacin and Flagyl empirically -GI pathogen panel and C. difficile testing pending -No recent use of antibiotics  Dehydration secondary to above -With noted high ostomy output -Continue aggressive IV fluid  Hypokalemia/hypomagnesemia -Replete and reevaluate -Maintain on telemetry  Moderate protein calorie malnutrition -Started on clear liquid diet  FOBT positive -Continue to monitor repeat CBC -No frank bleeding noted, continue to monitor -IV PPI for now due to PUD  History of PUD/alcoholic gastritis -IV PPI  Alcoholic liver disease -CIWA protocol  Total care time: 35 minutes.

## 2021-08-10 NOTE — Progress Notes (Signed)
Heart rate sustaining 116 to 120s at rest, MD notified

## 2021-08-10 NOTE — ED Provider Notes (Signed)
[facility] EMERGENCY DEPARTMENT Provider Note   CSN: [id number] Arrival date & time: 08/09/21  2311     History  Chief Complaint  Patient presents with   Emesis    Alyssa Carey is a 51 y.o. female.  HPI     This 51 year old female who presents with nausea, vomiting, diarrhea.  Patient reports 3 to 4-day history of nonbilious, nonbloody emesis and dark diarrhea ostomy output.  She states that she is changing her bag up to every 2 hours.  She reports increased irritation at the ostomy site.  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  Patient has not noted any frank blood in her stool vomit or stool.  No fevers.  No known sick contacts.  History of alcohol abuse.  Denies drinking in the last 2 months.  Home Medications Prior to Admission medications   Medication Sig Start Date End Date Taking? Authorizing Provider  acetaminophen (TYLENOL) 325 MG tablet Take 325 mg by mouth every 6 (six) hours as needed.    [provider]  cholecalciferol (VITAMIN D3) 25 MCG (1000 UNIT) tablet Take 1,000 Units by mouth daily.    [provider]  famotidine (PEPCID) 20 MG tablet Take 1 tablet (20 mg total) by mouth 2 (two) times daily. Patient not taking: Reported on 04/21/2021 03/29/21 04/28/21  Barb Merino, MD  folic acid (FOLVITE) 1 MG tablet Take 1 tablet (1 mg total) by mouth daily. 04/12/21   Aline August, MD  furosemide (LASIX) 40 MG tablet Take 1 tablet (40 mg total) by mouth daily. 04/23/21 05/23/21  Donne Hazel, MD  hydrOXYzine (ATARAX/VISTARIL) 10 MG tablet Take 1 tablet (10 mg total) by mouth 3 (three) times daily as needed for itching. 04/23/21   Donne Hazel, MD  Nutritional Supplements (BOOST PO) Take 1 Bottle by mouth 4 (four) times daily.    [provider]  vitamin C (ASCORBIC ACID) 500 MG tablet Take 500 mg by mouth daily.    [provider]      Allergies     Pantoprazole, Penicillins, and Oxycodone hcl    Review of Systems   Review of Systems  Constitutional:  Negative for fever.  Respiratory:  Negative for shortness of breath.   Gastrointestinal:  Positive for diarrhea, nausea and vomiting.  All other systems reviewed and are negative.  Physical Exam Updated Vital Signs BP 105/67    Pulse (!) 114    Temp 99.6 F (37.6 C) (Oral)    Resp (!) 35    Ht 1.473 m (4' 10" )    Wt 75.8 kg    SpO2 98%    BMI 34.93 kg/m  Physical Exam Vitals and nursing note reviewed.  Constitutional:      Appearance: She is well-developed. She is obese. She is not ill-appearing.  HENT:     Head: Normocephalic and atraumatic.     Mouth/Throat:     Mouth: Mucous membranes are dry.  Eyes:     Pupils: Pupils are equal, round, and reactive to light.  Cardiovascular:     Rate and Rhythm: Regular rhythm. Tachycardia present.     Heart sounds: Normal heart sounds.  Pulmonary:     Effort: Pulmonary effort is normal. No respiratory distress.     Breath sounds: No wheezing.  Abdominal:     General: Bowel sounds are normal.     Palpations: Abdomen is soft.  Tenderness: There is no abdominal tenderness. There is no guarding or rebound.     Comments: Stoma left mid abdomen, parastomal hernia noted, skin breakdown noted adjacent to the stoma  Musculoskeletal:     Cervical back: Neck supple.  Skin:    General: Skin is warm and dry.  Neurological:     Mental Status: She is alert and oriented to person, place, and time.  Psychiatric:        Mood and Affect: Mood normal.    ED Results / Procedures / Treatments   Labs (all labs ordered are listed, but only abnormal results are displayed) Labs Reviewed  CBC WITH DIFFERENTIAL/PLATELET - Abnormal; Notable for the following components:      Result Value   WBC 10.8 (*)    Neutro Abs 9.0 (*)    Lymphs Abs 0.5 (*)    Monocytes Absolute 1.2 (*)    Abs Immature Granulocytes 0.15 (*)    All other components within  normal limits  COMPREHENSIVE METABOLIC PANEL - Abnormal; Notable for the following components:   Sodium 131 (*)    Potassium 2.6 (*)    Chloride 94 (*)    Glucose, Bld 134 (*)    Albumin 2.9 (*)    AST 42 (*)    All other components within normal limits  POC OCCULT BLOOD, ED - Abnormal; Notable for the following components:   Fecal Occult Bld POSITIVE (*)    All other components within normal limits  RESP PANEL BY RT-PCR (FLU A&B, COVID) ARPGX2  LIPASE, BLOOD    EKG EKG Interpretation  Date/Time:  Wednesday August 10 2021 01:51:18 EST Ventricular Rate:  111 PR Interval:  148 QRS Duration: 100 QT Interval:  329 QTC Calculation: 447 R Axis:   -13 Text Interpretation: Sinus tachycardia Low voltage, precordial leads Confirmed by Thayer Jew [phone]) on 08/10/2021 2:30:25 AM  Radiology CT ABDOMEN PELVIS W CONTRAST  Result Date: 08/10/2021 CLINICAL DATA:  Nausea vomiting. EXAM: CT ABDOMEN AND PELVIS WITH CONTRAST TECHNIQUE: Multidetector CT imaging of the abdomen and pelvis was performed using the standard protocol following bolus administration of intravenous contrast. RADIATION DOSE REDUCTION: This exam was performed according to the departmental dose-optimization program which includes automated exposure control, adjustment of the mA and/or kV according to patient size and/or use of iterative reconstruction technique. CONTRAST:  179m OMNIPAQUE IOHEXOL 300 MG/ML  SOLN COMPARISON:  CT abdomen pelvis dated 04/09/2021. FINDINGS: Lower chest: The visualized lung bases are clear. No intra-abdominal free air or free fluid. Hepatobiliary: Diffuse fatty liver. No intrahepatic biliary dilatation. Small gallstone. No pericholecystic fluid or evidence of acute cholecystitis by CT. Pancreas: Unremarkable. No pancreatic ductal dilatation or surrounding inflammatory changes. Spleen: Normal in size without focal abnormality. Adrenals/Urinary Tract: The adrenal glands unremarkable. There is no  hydronephrosis on either side. There is symmetric enhancement and excretion of contrast by both kidneys. The visualized ureters and urinary bladder appear unremarkable. 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 There is diffuse inflammatory changes and thickening of the colon consistent with colitis. There is no bowel obstruction. The appendix is normal. Vascular/Lymphatic: The abdominal aorta and IVC are unremarkable. No portal venous gas. Small scattered mesenteric lymph nodes. No adenopathy. Reproductive: The uterus is anteverted.  No adnexal masses. Other: Anterior pelvic wall incisional scar. Musculoskeletal: No acute osseous pathology. IMPRESSION: 1. Pancolitis. No bowel obstruction. Normal appendix. 2. Fatty liver. 3. Cholelithiasis. Electronically Signed   By: ALaren EvertsD.  On: 08/10/2021 02:25    Procedures Procedures   CRITICAL CARE Performed by: Merryl Hacker   Total critical care time: 45 minutes  Critical care time was exclusive of separately billable procedures and treating other patients.  Critical care was necessary to treat or prevent imminent or life-threatening deterioration.  Critical care was time spent personally by me on the following activities: development of treatment plan with patient and/or surrogate as well as nursing, discussions with consultants, evaluation of patient's response to treatment, examination of patient, obtaining history from patient or surrogate, ordering and performing treatments and interventions, ordering and review of laboratory studies, ordering and review of radiographic studies, pulse oximetry and re-evaluation of patient's condition.  Medications Ordered in ED Medications  potassium chloride 10 mEq in 100 mL IVPB (0 mEq Intravenous Stopped 08/10/21 0254)  sodium chloride 0.9 % bolus 1,000 mL (0 mLs Intravenous Stopped 08/10/21 0151)  ondansetron (ZOFRAN) injection 4 mg (4 mg  Intravenous Given 08/10/21 0020)  loperamide (IMODIUM) capsule 4 mg (4 mg Oral Given 08/10/21 0020)  morphine 4 MG/ML injection 4 mg (4 mg Intravenous Given 08/10/21 0056)  sodium chloride 0.9 % bolus 1,000 mL (1,000 mLs Intravenous New Bag/Given 08/10/21 0151)  iohexol (OMNIPAQUE) 300 MG/ML solution 100 mL (100 mLs Intravenous Contrast Given 08/10/21 0202)    ED Course/ Medical Decision Making/ A&P Clinical Course as of 08/10/21 0259  Wed Aug 10, 2021  0236 Persistently tachycardic but remains clinically stable.  CT scan shows pancolitis.  Discussed this with patient.  Recommended admission electrolyte replacement and hydration.  Will discuss empiric antibiotics with hospitalist. [CH]    Clinical Course User Index [CH] Jaaziah Schulke, Barbette Hair, MD                           Medical Decision Making Amount and/or Complexity of Data Reviewed Labs: ordered. Radiology: ordered.  Risk Prescription drug management. Decision regarding hospitalization.   This patient presents to the ED for concern of Nausea, vomiting, high ostomy output, this involves an extensive number of treatment options, and is a complaint that carries with it a high risk of complications and morbidity.  The differential diagnosis includes colitis, gastroenteritis, obstruction  MDM:    Patient with complicated past medical history including necrotizing fasciitis and diverting colostomy who presents with nausea, vomiting, and increased ostomy output.  She is nontoxic.  She is tachycardic.  She is afebrile.  Denies sick contacts.  Abdominal exam is fairly benign.  She is not tender but does have breakdown around her stoma.  Labs obtained.  Patient given IV fluids.  Labs notable for potassium of two-point Ickes.  No EKG changes.  Lipase and LFTs are normal.  Negative COVID testing.  White count to 10.8.  Patient was given IV potassium.  She was also given pain and nausea medication.  CT scan obtained.  CT is concerning for pancolitis.   Would favor inflammatory colitis versus infectious.  Will discuss IV antibiotics with hospitalist. (Labs, imaging)  Labs: I Ordered, and personally interpreted labs.  The pertinent results include: CBC, BMP, lipase, COVID and influenza testing, potassium 2.6, sodium 131  Imaging Studies ordered: I ordered imaging studies including CT abdomen I independently visualized and interpreted imaging. I agree with the radiologist interpretation  Additional history obtained from patient.  External records from outside source obtained and reviewed including chart review  Critical Interventions: IV fluids, potassium, morphine and Zofran  Consultations: I requested consultation with the  hospitalist,  and discussed lab and imaging findings as well as pertinent plan - they recommend: Admission  Cardiac Monitoring: The patient was maintained on a cardiac monitor.  I personally viewed and interpreted the cardiac monitored which showed an underlying rhythm of: Sinus tachycardia  Reevaluation: After the interventions noted above, I reevaluated the patient and found that they have :improved   Considered admission for: Metabolite replacement, hydration, management of pancolitis  Social Determinants of Health: Lives independently  Disposition: Admit  Co morbidities that complicate the patient evaluation  Past Medical History:  Diagnosis Date   HTN (hypertension) 10/05/2020   Necrotizing fasciitis ([place])      Medicines Meds ordered this encounter  Medications   sodium chloride 0.9 % bolus 1,000 mL   ondansetron (ZOFRAN) injection 4 mg   loperamide (IMODIUM) capsule 4 mg   morphine 4 MG/ML injection 4 mg   sodium chloride 0.9 % bolus 1,000 mL   potassium chloride 10 mEq in 100 mL IVPB   iohexol (OMNIPAQUE) 300 MG/ML solution 100 mL    I have reviewed the patients home medicines and have made adjustments as needed  Problem List / ED Course: Problem List Items Addressed This Visit        Digestive   Pancolitis [facility]) - Primary     Other   Hypokalemia   Other Visit Diagnoses     Dehydration                       Final Clinical Impression(s) / ED Diagnoses Final diagnoses:  Pancolitis ([place])  Dehydration  Hypokalemia    Rx / DC Orders ED Discharge Orders     None         Reeta Kuk, Barbette Hair, MD 08/10/21 0302

## 2021-08-10 NOTE — Consult Note (Addendum)
Reason for Consult: Parastomal hernia Referring Physician: Dr. Janalyn Rouse is an 51 y.o. female.  HPI: Patient is a 51 year old black female status post diverting colostomy in 2021 in [place] for control of necrotizing fasciitis who presented to the emergency room with worsening abdominal pain and high ostomy output.  She also was complaining of nausea and vomiting.  A CT scan of the abdomen revealed pancolitis.  In reviewing the films, she has a very large paraesophageal hernia with a significant portion of her intra-abdominal contents present in the hernia.  She states that she has had a known hernia and is followed by the surgeons at [facility] in [place].  She was admitted to the ICU for further evaluation and treatment.  Surgery was called as there is seem to be a noted decrease in her ostomy output.  Patient does not complain of nausea at the present time.  Past Medical History:  Diagnosis Date   HTN (hypertension) 10/05/2020   Necrotizing fasciitis [facility])     Past Surgical History:  Procedure Laterality Date   BIOPSY  10/07/2020   Procedure: BIOPSY;  Surgeon: Daneil Dolin, MD;  [facility];  Service: Endoscopy;;   COLONOSCOPY WITH PROPOFOL N/A 10/07/2020   single healing rectal ulcer in distal rectum with surrounding mucosal friability and markedly abnormal proximal colon with ulceration s/p biopsies.   COLONOSCOPY WITH PROPOFOL  02/22/2021   Procedure: COLONOSCOPY WITH PROPOFOL;  Surgeon: [name], DO;  Location[facility];  Service: Endoscopy;;   ESOPHAGOGASTRODUODENOSCOPY (EGD) WITH PROPOFOL N/A 10/07/2020   non-bleeding gastric ulcer . Pathology with H.pylori negative   ESOPHAGOGASTRODUODENOSCOPY (EGD) WITH PROPOFOL N/A 12/27/2020   Procedure: ESOPHAGOGASTRODUODENOSCOPY (EGD) WITH PROPOFOL;  Surgeon: [name], DO;  Location[facility];  Service: Endoscopy;  Laterality: N/A;  1:00pm   FLEXIBLE SIGMOIDOSCOPY N/A  01/11/2020   Procedure: FLEXIBLE SIGMOIDOSCOPY;  Surgeon: Carol Ada, MD;  Location: [facility];  Service: Gastroenterology;  Laterality: N/A;   INCISION AND DRAINAGE PERIRECTAL ABSCESS N/A 12/25/2019   Procedure: IRRIGATION AND DEBRIDEMENT BUTTOCKS, LAP LOOP COLOSTOMY;  Surgeon: Greer Pickerel, MD;  Location: [facility] ORS;  Service: General;  Laterality: N/A;   IRRIGATION AND DEBRIDEMENT ABSCESS N/A 12/23/2019   Procedure: EXCISION AND DEBRIDEMENT LEFT BUTTOCK AND PERINEUM;  Surgeon: Clovis Riley, MD;  Location: [facility];  Service: General;  Laterality: N/A;   LAPAROSCOPIC LOOP COLOSTOMY N/A 12/25/2019   Procedure: LAPAROSCOPIC LOOP COLOSTOMY;  Surgeon: Greer Pickerel, MD;  Location: [facility];  Service: General;  Laterality: N/A;    Family History  Problem Relation Age of Onset   Colon polyps Mother        does not believe adenomas   Colon cancer Neg Hx     Social History:  reports that she has never smoked. She has never used smokeless tobacco. She reports that she does not currently use alcohol. She reports that she does not currently use drugs.  Allergies:  Allergies  Allergen Reactions   Pantoprazole     Stomach pain   Penicillins Hives    Did it involve swelling of the face/tongue/throat, SOB, or low BP? N Did it involve sudden or severe rash/hives, skin peeling, or any reaction on the inside of your mouth or nose? Y Did you need to seek medical attention at a hospital or doctor's office? N When did it last happen?  Childhood     If all above answers are "NO", may proceed with cephalosporin  use.    Oxycodone Hcl Rash    Medications: I have reviewed the patient's current medications.  Results for orders placed or performed during the hospital encounter of 08/09/21 (from the past 48 hour(s))  Resp Panel by RT-PCR (Flu A&B, Covid) Nasopharyngeal Swab     Status: None   Collection Time: 08/09/21 12:11 AM   Specimen: Nasopharyngeal Swab; Nasopharyngeal(NP) swabs in vial transport  medium  Result Value Ref Range   SARS Coronavirus 2 by RT PCR NEGATIVE NEGATIVE    Comment: (NOTE) SARS-CoV-2 target nucleic acids are NOT DETECTED.  The SARS-CoV-2 RNA is generally detectable in upper respiratory specimens during the acute phase of infection. The lowest concentration of SARS-CoV-2 viral copies this assay can detect is 138 copies/mL. A negative result does not preclude SARS-Cov-2 infection and should not be used as the sole basis for treatment or other patient management decisions. A negative result may occur with  improper specimen collection/handling, submission of specimen other than nasopharyngeal swab, presence of viral mutation(s) within the areas targeted by this assay, and inadequate number of viral copies(<138 copies/mL). A negative result must be combined with clinical observations, patient history, and epidemiological information. The expected result is Negative.  Fact Sheet for Patients:  EntrepreneurPulse.com.au  Fact Sheet for Healthcare Providers:  IncredibleEmployment.be  This test is no t yet approved or cleared by the [place] FDA and  has been authorized for detection and/or diagnosis of SARS-CoV-2 by FDA under an Emergency Use Authorization (EUA). This EUA will remain  in effect (meaning this test can be used) for the duration of the COVID-19 declaration under Section 564(b)(1) of the Act, 21 U.S.C.section 360bbb-3(b)(1), unless the authorization is terminated  or revoked sooner.       Influenza A by PCR NEGATIVE NEGATIVE   Influenza B by PCR NEGATIVE NEGATIVE    Comment: (NOTE) The Xpert Xpress SARS-CoV-2/FLU/RSV plus assay is intended as an aid in the diagnosis of influenza from Nasopharyngeal swab specimens and should not be used as a sole basis for treatment. Nasal washings and aspirates are unacceptable for Xpert Xpress SARS-CoV-2/FLU/RSV testing.  Fact Sheet for  Patients: EntrepreneurPulse.com.au  Fact Sheet for Healthcare Providers: IncredibleEmployment.be  This test is not yet approved or cleared by the [place] FDA and has been authorized for detection and/or diagnosis of SARS-CoV-2 by FDA under an Emergency Use Authorization (EUA). This EUA will remain in effect (meaning this test can be used) for the duration of the COVID-19 declaration under Section 564(b)(1) of the Act, 21 U.S.C. section 360bbb-3(b)(1), unless the authorization is terminated or revoked.  Performed at [facility], [address] [place], [place] [address]   CBC with Differential     Status: Abnormal   Collection Time: 08/10/21 12:11 AM  Result Value Ref Range   WBC 10.8 (H) 4.0 - 10.5 K/uL   RBC 4.07 3.87 - 5.11 MIL/uL   Hemoglobin 12.6 12.0 - 15.0 g/dL   HCT 36.9 36.0 - 46.0 %   MCV 90.7 80.0 - 100.0 fL   MCH 31.0 26.0 - 34.0 pg   MCHC 34.1 30.0 - 36.0 g/dL   RDW 14.6 11.5 - 15.5 %   Platelets 316 150 - 400 K/uL   nRBC 0.0 0.0 - 0.2 %   Neutrophils Relative % 83 %   Neutro Abs 9.0 (H) 1.7 - 7.7 K/uL   Lymphocytes Relative 5 %   Lymphs Abs 0.5 (L) 0.7 - 4.0 K/uL   Monocytes Relative 11 %  Monocytes Absolute 1.2 (H) 0.1 - 1.0 K/uL   Eosinophils Relative 0 %   Eosinophils Absolute 0.0 0.0 - 0.5 K/uL   Basophils Relative 0 %   Basophils Absolute 0.0 0.0 - 0.1 K/uL   WBC Morphology DOHLE BODIES     Comment: TOXIC GRANULATION FEW BANDS PRESENT    RBC Morphology MORPHOLOGY UNREMARKABLE    Smear Review MORPHOLOGY UNREMARKABLE    Immature Granulocytes 1 %   Abs Immature Granulocytes 0.15 (H) 0.00 - 0.07 K/uL    Comment: Performed at [facility], [address] [place], [place] [address]  Comprehensive metabolic panel     Status: Abnormal   Collection Time: 08/10/21 12:11 AM  Result Value Ref Range   Sodium 131 (L) 135 - 145 mmol/L   Potassium 2.6 (LL) 3.5 - 5.1 mmol/L    Comment: CRITICAL RESULT CALLED TO, READ  BACK BY AND VERIFIED WITH: Hicks,W@[id number]  by matthews, b 1.25.23    Chloride 94 (L) 98 - 111 mmol/L   CO2 23 22 - 32 mmol/L   Glucose, Bld 134 (H) 70 - 99 mg/dL    Comment: Glucose reference range applies only to samples taken after fasting for at least 8 hours.   BUN 11 6 - 20 mg/dL   Creatinine, Ser 0.48 0.44 - 1.00 mg/dL   Calcium 8.9 8.9 - 10.3 mg/dL   Total Protein 7.3 6.5 - 8.1 g/dL   Albumin 2.9 (L) 3.5 - 5.0 g/dL   AST 42 (H) 15 - 41 U/L   ALT 25 0 - 44 U/L   Alkaline Phosphatase 124 38 - 126 U/L   Total Bilirubin 1.1 0.3 - 1.2 mg/dL   GFR, Estimated >60 >60 mL/min    Comment: (NOTE) Calculated using the CKD-EPI Creatinine Equation (2021)    Anion gap 14 5 - 15    Comment: Performed at [facility], [address] [place], [place] [address]  Lipase, blood     Status: None   Collection Time: 08/10/21 12:11 AM  Result Value Ref Range   Lipase 25 11 - 51 U/L    Comment: Performed at [facility], [address] [place], [place] [address]  Magnesium     Status: None   Collection Time: 08/10/21 12:11 AM  Result Value Ref Range   Magnesium 1.7 1.7 - 2.4 mg/dL    Comment: Performed at [facility], [address] [place], [place] [address]  POC occult blood, ED     Status: Abnormal   Collection Time: 08/10/21 12:15 AM  Result Value Ref Range   Fecal Occult Bld POSITIVE (A) NEGATIVE  Lactic acid, plasma     Status: None   Collection Time: 08/10/21  3:59 AM  Result Value Ref Range   Lactic Acid, Venous 0.7 0.5 - 1.9 mmol/L    Comment: Performed at [facility], [address] [place],  [address]  Procalcitonin - Baseline     Status: None   Collection Time: 08/10/21  3:59 AM  Result Value Ref Range   Procalcitonin 3.01 ng/mL    Comment:        Interpretation: PCT > 2 ng/mL: Systemic infection (sepsis) is likely, unless other causes are known. (NOTE)       Sepsis PCT Algorithm           Lower Respiratory Tract  Infection PCT  Algorithm    ----------------------------     ----------------------------         PCT < 0.25 ng/mL                PCT < 0.10 ng/mL          Strongly encourage             Strongly discourage   discontinuation of antibiotics    initiation of antibiotics    ----------------------------     -----------------------------       PCT 0.25 - 0.50 ng/mL            PCT 0.10 - 0.25 ng/mL               OR       >80% decrease in PCT            Discourage initiation of                                            antibiotics      Encourage discontinuation           of antibiotics    ----------------------------     -----------------------------         PCT >= 0.50 ng/mL              PCT 0.26 - 0.50 ng/mL               AND       <80% decrease in PCT              Encourage initiation of                                             antibiotics       Encourage continuation           of antibiotics    ----------------------------     -----------------------------        PCT >= 0.50 ng/mL                  PCT > 0.50 ng/mL               AND         increase in PCT                  Strongly encourage                                      initiation of antibiotics    Strongly encourage escalation           of antibiotics                                     -----------------------------                                           PCT <= 0.25 ng/mL  OR                                        > 80% decrease in PCT                                      Discontinue / Do not initiate                                             antibiotics  Performed at [address] [place], [place] [address]   Comprehensive metabolic panel     Status: Abnormal   Collection Time: 08/10/21  3:59 AM  Result Value Ref Range   Sodium 130 (L) 135 - 145 mmol/L   Potassium 2.8 (L) 3.5 - 5.1 mmol/L   Chloride 98 98 - 111 mmol/L   CO2 21 (L) 22 - 32 mmol/L   Glucose, Bld  86 70 - 99 mg/dL    Comment: Glucose reference range applies only to samples taken after fasting for at least 8 hours.   BUN 8 6 - 20 mg/dL   Creatinine, Ser 0.46 0.44 - 1.00 mg/dL   Calcium 7.6 (L) 8.9 - 10.3 mg/dL   Total Protein 6.0 (L) 6.5 - 8.1 g/dL   Albumin 2.3 (L) 3.5 - 5.0 g/dL   AST 32 15 - 41 U/L   ALT 21 0 - 44 U/L   Alkaline Phosphatase 97 38 - 126 U/L   Total Bilirubin 1.0 0.3 - 1.2 mg/dL   GFR, Estimated >60 >60 mL/min    Comment: (NOTE) Calculated using the CKD-EPI Creatinine Equation (2021)    Anion gap 11 5 - 15    Comment: Performed at [facility], [address], [place], [place] [address]  Magnesium     Status: Abnormal   Collection Time: 08/10/21  3:59 AM  Result Value Ref Range   Magnesium 1.6 (L) 1.7 - 2.4 mg/dL    Comment: Performed at [facility], [address], [place],  [address]  CBC WITH DIFFERENTIAL     Status: Abnormal   Collection Time: 08/10/21  3:59 AM  Result Value Ref Range   WBC 10.4 4.0 - 10.5 K/uL   RBC 3.34 (L) 3.87 - 5.11 MIL/uL   Hemoglobin 10.4 (L) 12.0 - 15.0 g/dL   HCT 31.3 (L) 36.0 - 46.0 %   MCV 93.7 80.0 - 100.0 fL   MCH 31.1 26.0 - 34.0 pg   MCHC 33.2 30.0 - 36.0 g/dL   RDW 15.0 11.5 - 15.5 %   Platelets 295 150 - 400 K/uL   nRBC 0.0 0.0 - 0.2 %   Neutrophils Relative % 64 %   Neutro Abs 7.9 (H) 1.7 - 7.7 K/uL   Band Neutrophils 12 %   Lymphocytes Relative 6 %   Lymphs Abs 0.6 (L) 0.7 - 4.0 K/uL   Monocytes Relative 8 %   Monocytes Absolute 0.8 0.1 - 1.0 K/uL   Eosinophils Relative 0 %   Eosinophils Absolute 0.0 0.0 - 0.5 K/uL   Basophils Relative 1 %   Basophils Absolute 0.1 0.0 - 0.1 K/uL   WBC Morphology DOHLE BODIES     Comment:  MILD LEFT SHIFT (1-5% METAS, OCC MYELO, OCC BANDS) TOXIC GRANULATION BANDS PRESENT    RBC Morphology MORPHOLOGY UNREMARKABLE    Smear Review MORPHOLOGY UNREMARKABLE    Metamyelocytes Relative 5 %   Myelocytes 4 %    Comment: Performed at [facility], [address],  [place], [place] [address]  MRSA Next Gen by PCR, Nasal     Status: None   Collection Time: 08/10/21  6:46 AM   Specimen: Nasal Mucosa; Nasal Swab  Result Value Ref Range   MRSA by PCR Next Gen NOT DETECTED NOT DETECTED    Comment: (NOTE) The GeneXpert MRSA Assay (FDA approved for NASAL specimens only), is one component of a comprehensive MRSA colonization surveillance program. It is not intended to diagnose MRSA infection nor to guide or monitor treatment for MRSA infections. Test performance is not FDA approved in patients less than 42 years old. Performed at [facility], [address], [place], [place] [address]   C Difficile Quick Screen w PCR reflex     Status: None   Collection Time: 08/10/21  8:22 AM   Specimen: STOOL  Result Value Ref Range   C Diff antigen NEGATIVE NEGATIVE   C Diff toxin NEGATIVE NEGATIVE   C Diff interpretation No C. difficile detected.     Comment: Performed at [facility], [address], [place], [place] [address]    [place]  Result Date: 08/10/2021 CLINICAL DATA:  Nausea vomiting. EXAM: CT ABDOMEN AND PELVIS WITH CONTRAST TECHNIQUE: Multidetector CT imaging of the abdomen and pelvis was performed using the standard protocol following bolus administration of intravenous contrast. RADIATION DOSE REDUCTION: This exam was performed according to the departmental dose-optimization program which includes automated exposure control, adjustment of the mA and/or kV according to patient size and/or use of iterative reconstruction technique. CONTRAST:  152m OMNIPAQUE IOHEXOL 300 MG/ML  SOLN COMPARISON:  CT abdomen pelvis dated 04/09/2021. FINDINGS: Lower chest: The visualized lung bases are clear. No intra-abdominal free air or free fluid. Hepatobiliary: Diffuse fatty liver. No intrahepatic biliary dilatation. Small gallstone. No pericholecystic fluid or evidence of acute cholecystitis by CT. Pancreas: Unremarkable. No pancreatic ductal dilatation or  surrounding inflammatory changes. Spleen: Normal in size without focal abnormality. Adrenals/Urinary Tract: The adrenal glands unremarkable. There is no hydronephrosis on either side. There is symmetric enhancement and excretion of contrast by both kidneys. The visualized ureters and urinary bladder appear unremarkable. 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 There is diffuse inflammatory changes and thickening of the colon consistent with colitis. There is no bowel obstruction. The appendix is normal. Vascular/Lymphatic: The abdominal aorta and IVC are unremarkable. No portal venous gas. Small scattered mesenteric lymph nodes. No adenopathy. Reproductive: The uterus is anteverted.  No adnexal masses. Other: Anterior pelvic wall incisional scar. Musculoskeletal: No acute osseous pathology. IMPRESSION: 1. Pancolitis. No bowel obstruction. Normal appendix. 2. Fatty liver. 3. Cholelithiasis. Electronically Signed   By: AAnner CreteM.D.   On: 08/10/2021 02:25   [place]domen Limited  Result Date: 08/10/2021 CLINICAL DATA:  Evaluate for abscess EXAM: ULTRASOUND ABDOMEN LIMITED COMPARISON:  None. FINDINGS: Limited ultrasound of the area around the stoma demonstrates normal appearing bowel loops with no evidence of organized fluid collection. IMPRESSION: No evidence abscess. Electronically Signed   By: LYetta GlassmanM.D.   On: 08/10/2021 14:34    ROS:  Pertinent items are noted in HPI.  Blood pressure (!) 82/42, pulse (!) 121, temperature 100.1  F (37.8 C), temperature source Oral, resp. rate (!) 29, height 4' 10"  (1.473 m), weight 73.2 kg, SpO2 98 %. Physical Exam: Pleasant black female no acute distress Head is normocephalic, atraumatic Lungs clear to auscultation with good breath sounds bilaterally Heart examination reveals regular rate and rhythm without S3, S4, murmurs Abdomen is rotund with a colostomy present in the left lower quadrant.   There is a significant mount of gas present in the bag with some stool present.  It is watery in nature.  The parastomal hernia is not reducible given its enormous size.  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  Would continue to monitor ostomy output.  Nothing further to add from the surgical standpoint.  Continue current management.  We will follow with you.  Aviva Signs 08/10/2021, 2:45 PM

## 2021-08-10 NOTE — Progress Notes (Signed)
Patient requested something for itching other than Benadryl, MD notified

## 2021-08-10 NOTE — Progress Notes (Signed)
Consulted for SA counseling/resource. Discussed reason for consult with patient. Patient stated that she did not tell anyone that she was in need of SA resources or counseling. Patient declines SA resources. Patient is uninsured however; she states that she has a PCP.    Riannon Mukherjee, Clydene Pugh, LCSW

## 2021-08-10 NOTE — Consult Note (Addendum)
[place] Nurse ostomy follow up Patient receiving care in [facility] ICU 2. Consult completed remotely. Stoma type/location: LUQ colostomy Stomal assessment/size: flat stoma with large parastomal hernia. Peristomal assessment: "irritated".  Patient is well known to the [facility] service, and she has been well informed on caring for her colostomy.   Treatment options for stomal/peristomal skin: Crusting with stoma powder and skin prep pads, use of barrier ring, convex pouch with belt. Output  Ostomy pouching: 1pc.1pc. Patient uses ostomy supplies: Roselee Culver [phone]; barrier Antionette Poles [phone]; ostomy belt, Kellie Simmering [phone]. [place] AROUND STOMA WITH WATER ONLY THEN, use stoma powder Kellie Simmering #6) and skin barrier wipes to crust around stoma.   Education provided: none Enrolled patient in [employer] Discharge program: Yes, previously.  I have also entered crusting orders: Instruction for "Crusting" with Stoma Powder and Skin Barrier Wipes: After cleaning around the stoma WITH WATER ONLY, sprinkle Stoma Powder on the irritated skin. Spread the powder around with your finger.  The powder will adhere ONLY to wet, raw skin.  Brush any loose powder off. DAB the powder that stuck to the irritated skin with a skin barrier wipe.  Do NOT rub, only dab the powder to moisten it. Allow the powder to air dry.  You can repeat the process of spreading powder and dabbing up to three times, allowing each process to air dry. Place the prepared pouching system around the stoma.  I Secure Chatted with the primary RN, Crystal, and she tells me that night shift changed her pouch and it is without issues at this time. [place] nurse will not follow at this time.  Please re-consult the [place] team if needed.  Val Riles, RN, MSN, CWOCN, CNS-BC, pager [phone]

## 2021-08-10 NOTE — Progress Notes (Signed)
Patient's ABD notably more distended around stoma, warm to the touch, patient reported it feels like heat inside it, Temp 100.1, MD notified

## 2021-08-11 ENCOUNTER — Encounter (HOSPITAL_COMMUNITY): Payer: Self-pay | Admitting: Family Medicine

## 2021-08-11 LAB — CBC
HCT: 29.8 % — ABNORMAL LOW (ref 36.0–46.0)
Hemoglobin: 9.6 g/dL — ABNORMAL LOW (ref 12.0–15.0)
MCH: 30.4 pg (ref 26.0–34.0)
MCHC: 32.2 g/dL (ref 30.0–36.0)
MCV: 94.3 fL (ref 80.0–100.0)
Platelets: 275 10*3/uL (ref 150–400)
RBC: 3.16 MIL/uL — ABNORMAL LOW (ref 3.87–5.11)
RDW: 15.3 % (ref 11.5–15.5)
WBC: 8.4 10*3/uL (ref 4.0–10.5)
nRBC: 0 % (ref 0.0–0.2)

## 2021-08-11 LAB — BASIC METABOLIC PANEL
Anion gap: 7 (ref 5–15)
BUN: <5 mg/dL — ABNORMAL LOW (ref 6–20)
CO2: 20 mmol/L — ABNORMAL LOW (ref 22–32)
Calcium: 7.8 mg/dL — ABNORMAL LOW (ref 8.9–10.3)
Chloride: 108 mmol/L (ref 98–111)
Creatinine, Ser: 0.41 mg/dL — ABNORMAL LOW (ref 0.44–1.00)
GFR, Estimated: >60 mL/min (ref >60)
Glucose, Bld: 104 mg/dL — ABNORMAL HIGH (ref 70–99)
Potassium: 2.9 mmol/L — ABNORMAL LOW (ref 3.5–5.1)
Sodium: 135 mmol/L (ref 135–145)

## 2021-08-11 LAB — GASTROINTESTINAL PANEL BY PCR, STOOL (REPLACES STOOL CULTURE)

## 2021-08-11 LAB — MAGNESIUM: Magnesium: 2 mg/dL (ref 1.7–2.4)

## 2021-08-11 LAB — TSH: TSH: 0.632 u[IU]/mL (ref 0.350–4.500)

## 2021-08-11 LAB — LACTIC ACID, PLASMA
Lactic Acid, Venous: 1.4 mmol/L (ref 0.5–1.9)
Lactic Acid, Venous: 1.1 mmol/L (ref 0.5–1.9)

## 2021-08-11 MED ORDER — SODIUM CHLORIDE 0.9 % IV BOLUS
500.0000 mL | Freq: Once | INTRAVENOUS | Status: AC
  Administered 2021-08-11: 500 mL via INTRAVENOUS

## 2021-08-11 MED ORDER — POTASSIUM CHLORIDE 10 MEQ/100ML IV SOLN
INTRAVENOUS | Status: AC
  Filled 2021-08-11: qty 100

## 2021-08-11 MED ORDER — TEMAZEPAM 7.5 MG PO CAPS
7.5000 mg | ORAL_CAPSULE | Freq: Every day | ORAL | Status: DC
  Administered 2021-08-11 – 2021-08-16 (×6): 7.5 mg via ORAL
  Filled 2021-08-11 (×7): qty 1

## 2021-08-11 MED ORDER — POTASSIUM CHLORIDE 10 MEQ/100ML IV SOLN
10.0000 meq | INTRAVENOUS | Status: AC
  Administered 2021-08-11 (×6): 10 meq via INTRAVENOUS
  Filled 2021-08-11 (×4): qty 100

## 2021-08-11 NOTE — Progress Notes (Signed)
[facility] Progress Note     Subjective: Patient seen and examined.  She is resting comfortably in bed.  She complains of some intermittent cramping in her abdomen.  She denies nausea and vomiting.  She continues to have liquid stool and gas from her ostomy.  Objective: Vital signs in last 24 hours: Temp:  [98 F (36.7 C)-100.1 F (37.8 C)] 99.6 F (37.6 C) (01/25 2320) Pulse Rate:  [100-130] 126 (01/26 0845) Resp:  [15-50] 48 (01/26 0845) BP: (82-128)/(42-102) 120/69 (01/26 0800) SpO2:  [89 %-100 %] 100 % (01/26 0845) Weight:  [80 kg] 80 kg (01/26 0502) Last BM Date: 08/11/21  Intake/Output from previous day: 01/25 0701 - 01/26 0700 In: 5057.1 [P.O.:1150; I.V.:1966.5; IV Piggyback:1940.6] Out: 800 [Urine:350; Stool:450] Intake/Output this shift: No intake/output data recorded.  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  Studies/Results: CT ABDOMEN PELVIS W CONTRAST  Result Date: 08/10/2021 CLINICAL DATA:  Nausea vomiting. EXAM: CT ABDOMEN AND PELVIS WITH CONTRAST TECHNIQUE: Multidetector CT imaging of the abdomen and pelvis was performed using the standard protocol following bolus administration of intravenous contrast. RADIATION DOSE REDUCTION: This exam was performed according to the departmental dose-optimization program which includes automated exposure control, adjustment of the mA and/or kV according to patient size and/or use of  iterative reconstruction technique. CONTRAST:  1102m OMNIPAQUE IOHEXOL 300 MG/ML  SOLN COMPARISON:  CT abdomen pelvis dated 04/09/2021. FINDINGS: Lower chest: The visualized lung bases are clear. No intra-abdominal free air or free fluid. Hepatobiliary: Diffuse fatty liver. No intrahepatic biliary dilatation. Small gallstone. No pericholecystic fluid or evidence of acute cholecystitis by CT. Pancreas: Unremarkable. No pancreatic ductal dilatation or surrounding inflammatory changes. Spleen: Normal in size without focal abnormality. Adrenals/Urinary Tract: The adrenal glands unremarkable. There is no hydronephrosis on either side. There is symmetric enhancement and excretion of contrast by both kidneys. The visualized ureters and urinary bladder appear unremarkable. 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 There is diffuse inflammatory changes and thickening of the colon consistent with colitis. There is no bowel obstruction. The appendix is normal. Vascular/Lymphatic: The abdominal aorta and IVC are unremarkable. No portal venous gas. Small scattered mesenteric lymph nodes. No adenopathy. Reproductive: The uterus is anteverted.  No adnexal masses. Other: Anterior pelvic wall incisional scar. Musculoskeletal: No acute osseous pathology. IMPRESSION: 1. Pancolitis. No bowel obstruction. Normal appendix. 2. Fatty liver. 3. Cholelithiasis. Electronically Signed   By: AAnner CreteM.D.   On: 08/10/2021 02:25   UKoreaAbdomen Limited  Result Date: 08/10/2021 CLINICAL DATA:  Evaluate for abscess EXAM: ULTRASOUND ABDOMEN LIMITED COMPARISON:  None. FINDINGS: Limited ultrasound of the area around the stoma demonstrates normal appearing bowel loops with no evidence of organized fluid collection. IMPRESSION: No evidence abscess. Electronically Signed   By: LYetta GlassmanM.D.   On: 08/10/2021 14:34  Anti-infectives: Anti-infectives (From admission, onward)     Start     Dose/Rate Route Frequency Ordered Stop   08/10/21 1000  ciprofloxacin (CIPRO) IVPB 400 mg        400 mg 200 mL/hr over 60 Minutes Intravenous Every 12 hours 08/10/21 0824     08/10/21 0915  metroNIDAZOLE (FLAGYL) IVPB 500 mg        500 mg 100 mL/hr over 60 Minutes Intravenous Every 8 hours 08/10/21 2229         Assessment/Plan:  Patient is a 51 year old female who was admitted with high ostomy output, dehydration, and pancolitis.  -Parastomal hernia remains soft, and nonobstructed -Patient continues to have ostomy output, liquid stool and gas -Okay for regular diet from general surgery standpoint -No acute surgical intervention at this time -Care per primary team   LOS: 0 days    Lamorris Knoblock A Hartlee Amedee 08/11/2021

## 2021-08-11 NOTE — Progress Notes (Signed)
Patient tachycardic overnight with high resp rate, lactic acid down to 1.4, BPs are more stable with MAP staying over 60, MD notified

## 2021-08-11 NOTE — Plan of Care (Signed)
°  Problem: Education: Goal: Knowledge of General Education information will improve Description: Including pain rating scale, medication(s)/side effects and non-pharmacologic comfort measures Outcome: Progressing   Problem: Health Behavior/Discharge Planning: Goal: Ability to manage health-related needs will improve Outcome: Progressing   Problem: Clinical Measurements: Goal: Ability to maintain clinical measurements within normal limits will improve Outcome: Progressing Goal: Will remain free from infection Outcome: Progressing Goal: Diagnostic test results will improve Outcome: Progressing Goal: Respiratory complications will improve Outcome: Progressing Goal: Cardiovascular complication will be avoided Outcome: Progressing   Problem: Activity: Goal: Risk for activity intolerance will decrease Outcome: Progressing   Problem: Coping: Goal: Level of anxiety will decrease Outcome: Progressing   Problem: Elimination: Goal: Will not experience complications related to bowel motility Outcome: Progressing Goal: Will not experience complications related to urinary retention Outcome: Progressing   Problem: Pain Managment: Goal: General experience of comfort will improve Outcome: Progressing   Problem: Safety: Goal: Ability to remain free from injury will improve Outcome: Progressing   Problem: Skin Integrity: Goal: Risk for impaired skin integrity will decrease Outcome: Progressing

## 2021-08-11 NOTE — Progress Notes (Signed)
PROGRESS NOTE    Alyssa Carey  BMW:[id number] DOB: 1970-11-14 DOA: 08/09/2021 PCP: Caryl Bis, MD   Brief Narrative:   Alyssa Carey  is a 51 y.o. 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  Patient was admitted with pancolitis and associated dehydration as well as electrolyte abnormalities with hypokalemia and hypomagnesemia in the setting of ongoing high output/diarrhea.  She has been started on ciprofloxacin and Flagyl empirically on 1/25 and GI pathogen panel as well as C. difficile testing has been ordered.  She continues to require aggressive IV fluid hydration at this time.  Assessment & Plan:   Principal Problem:   Dehydration Active Problems:   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CC)   Hypokalemia   Hypoalbuminemia due to protein-calorie malnutrition (HCC)   Pancolitis -Started on ciprofloxacin and Flagyl empirically -GI pathogen panel pending and C. difficile testing negative -No recent use of antibiotics   Dehydration secondary to above -With noted high ostomy output -Continue aggressive IV fluid  Sinus tachycardia -Likely related to the dehydration as well as persistent electrolyte abnormalities -No sign of anxiety noted -Check TSH   Hypokalemia/hypomagnesemia -Replete and reevaluate -Maintain on telemetry   Moderate protein calorie malnutrition -Started on clear liquid diet   FOBT positive -Continue to monitor repeat CBC -No frank bleeding noted, continue to monitor -IV PPI for now due to PUD   History of PUD/alcoholic gastritis -IV PPI   Alcoholic liver disease -CIWA protocol   DVT prophylaxis: Heparin Code Status: Full Family Communication: None at bedside Disposition Plan:  Status is: Observation  The patient will require care spanning > 2 midnights and should be moved to inpatient because: IV fluids and meds.    Consultants:  General  surgery  Procedures:  See below  Antimicrobials:  Anti-infectives (From admission, onward)    Start     Dose/Rate Route Frequency Ordered Stop   08/10/21 1000  ciprofloxacin (CIPRO) IVPB 400 mg        400 mg 200 mL/hr over 60 Minutes Intravenous Every 12 hours 08/10/21 0824     08/10/21 0915  metroNIDAZOLE (FLAGYL) IVPB 500 mg        500 mg 100 mL/hr over 60 Minutes Intravenous Every 8 hours 08/10/21 0824         Subjective: Patient seen and evaluated today with mild abdominal cramping noted.  She has no further nausea or vomiting and has tolerated clear liquid diet and would like to be advanced.  She continues to have liquid stool and gas from her ostomy.  Persistent hypokalemia noted as well as labile blood pressure readings and ongoing tachycardia.  Objective: Vitals:   08/11/21 0502 08/11/21 0700 08/11/21 0800 08/11/21 0845  BP:  111/67 120/69   Pulse:  (!) 113 (!) 124 (!) 126  Resp:  (!) 42 (!) 44 (!) 48  Temp:      TempSrc:      SpO2:  100% 100% 100%  Weight: 80 kg     Height:        Intake/Output Summary (Last 24 hours) at 08/11/2021 1228 Last data filed at 08/11/2021 0321 Gross per 24 hour  Intake 4457.06 ml  Output 725 ml  Net 3732.06 ml   Filed Weights   08/09/21 2319 08/10/21 0619 08/11/21 0502  Weight: 75.8 kg 73.2 kg 80 kg    Examination:  General exam: Appears calm and comfortable  Respiratory system:  Clear to auscultation. Respiratory effort normal. Cardiovascular system: S1 & S2 heard, RRR.  Gastrointestinal system: Abdomen is soft with parastomal hernia that appears nonobstructed.  Ostomy output with liquid stool and gas. Central nervous system: Alert and awake Extremities: No edema Skin: No significant lesions noted Psychiatry: Flat affect.    Data Reviewed: I have personally reviewed following labs and imaging studies  CBC: Recent Labs  Lab 08/10/21 0011 08/10/21 0359 08/10/21 1418 08/11/21 0422  WBC 10.8* 10.4 8.5 8.4  NEUTROABS  9.0* 7.9*  --   --   HGB 12.6 10.4* 9.5* 9.6*  HCT 36.9 31.3* 29.3* 29.8*  MCV 90.7 93.7 95.8 94.3  PLT 316 295 253 888   Basic Metabolic Panel: Recent Labs  Lab 08/10/21 0011 08/10/21 0359 08/11/21 0422  NA 131* 130* 135  K 2.6* 2.8* 2.9*  CL 94* 98 108  CO2 23 21* 20*  GLUCOSE 134* 86 104*  BUN 11 8 <5*  CREATININE 0.48 0.46 0.41*  CALCIUM 8.9 7.6* 7.8*  MG 1.7 1.6* 2.0   GFR: Estimated Creatinine Clearance: 76.7 mL/min (A) (by C-G formula based on SCr of 0.41 mg/dL (L)). Liver Function Tests: Recent Labs  Lab 08/10/21 0011 08/10/21 0359  AST 42* 32  ALT 25 21  ALKPHOS 124 97  BILITOT 1.1 1.0  PROT 7.3 6.0*  ALBUMIN 2.9* 2.3*   Recent Labs  Lab 08/10/21 0011  LIPASE 25   No results for input(s): AMMONIA in the last 168 hours. Coagulation Profile: No results for input(s): INR, PROTIME in the last 168 hours. Cardiac Enzymes: No results for input(s): CKTOTAL, CKMB, CKMBINDEX, TROPONINI in the last 168 hours. BNP (last 3 results) No results for input(s): PROBNP in the last 8760 hours. HbA1C: No results for input(s): HGBA1C in the last 72 hours. CBG: No results for input(s): GLUCAP in the last 168 hours. Lipid Profile: No results for input(s): CHOL, HDL, LDLCALC, TRIG, CHOLHDL, LDLDIRECT in the last 72 hours. Thyroid Function Tests: No results for input(s): TSH, T4TOTAL, FREET4, T3FREE, THYROIDAB in the last 72 hours. Anemia Panel: No results for input(s): VITAMINB12, FOLATE, FERRITIN, TIBC, IRON, RETICCTPCT in the last 72 hours. Sepsis Labs: Recent Labs  Lab 08/10/21 0359 08/10/21 1418 08/11/21 0152 08/11/21 0422  PROCALCITON 3.01  --   --   --   LATICACIDVEN 0.7 4.1* 1.4 1.1    Recent Results (from the past 240 hour(s))  Resp Panel by RT-PCR (Flu A&B, Covid) Nasopharyngeal Swab     Status: None   Collection Time: 08/09/21 12:11 AM   Specimen: Nasopharyngeal Swab; Nasopharyngeal(NP) swabs in vial transport medium  Result Value Ref Range Status    SARS Coronavirus 2 by RT PCR NEGATIVE NEGATIVE Final    Comment: (NOTE) SARS-CoV-2 target nucleic acids are NOT DETECTED.  The SARS-CoV-2 RNA is generally detectable in upper respiratory specimens during the acute phase of infection. The lowest concentration of SARS-CoV-2 viral copies this assay can detect is 138 copies/mL. A negative result does not preclude SARS-Cov-2 infection and should not be used as the sole basis for treatment or other patient management decisions. A negative result may occur with  improper specimen collection/handling, submission of specimen other than nasopharyngeal swab, presence of viral mutation(s) within the areas targeted by this assay, and inadequate number of viral copies(<138 copies/mL). A negative result must be combined with clinical observations, patient history, and epidemiological information. The expected result is Negative.  Fact Sheet for Patients:  EntrepreneurPulse.com.au  Fact Sheet for Healthcare Providers:  IncredibleEmployment.be  This test is no t yet approved or cleared by the [place] and  has been authorized for detection and/or diagnosis of SARS-CoV-2 by FDA under an Emergency Use Authorization (EUA). This EUA will remain  in effect (meaning this test can be used) for the duration of the COVID-19 declaration under Section 564(b)(1) of the Act, 21 U.S.C.section 360bbb-3(b)(1), unless the authorization is terminated  or revoked sooner.       Influenza A by PCR NEGATIVE NEGATIVE Final   Influenza B by PCR NEGATIVE NEGATIVE Final    Comment: (NOTE) The Xpert Xpress SARS-CoV-2/FLU/RSV plus assay is intended as an aid in the diagnosis of influenza from Nasopharyngeal swab specimens and should not be used as a sole basis for treatment. Nasal washings and aspirates are unacceptable for Xpert Xpress SARS-CoV-2/FLU/RSV testing.  Fact Sheet for  Patients: EntrepreneurPulse.com.au  Fact Sheet for Healthcare Providers: IncredibleEmployment.be  This test is not yet approved or cleared by the [place] FDA and has been authorized for detection and/or diagnosis of SARS-CoV-2 by FDA under an Emergency Use Authorization (EUA). This EUA will remain in effect (meaning this test can be used) for the duration of the COVID-19 declaration under Section 564(b)(1) of the Act, 21 U.S.C. section 360bbb-3(b)(1), unless the authorization is terminated or revoked.  Performed at [facility], [address] [place], [place] [address]   MRSA Next Gen by PCR, Nasal     Status: None   Collection Time: 08/10/21  6:46 AM   Specimen: Nasal Mucosa; Nasal Swab  Result Value Ref Range Status   MRSA by PCR Next Gen NOT DETECTED NOT DETECTED Final    Comment: (NOTE) The GeneXpert MRSA Assay (FDA approved for NASAL specimens only), is one component of a comprehensive MRSA colonization surveillance program. It is not intended to diagnose MRSA infection nor to guide or monitor treatment for MRSA infections. Test performance is not FDA approved in patients less than 58 years old. Performed at [facility], [address], [place], [place] [address]   C Difficile Quick Screen w PCR reflex     Status: None   Collection Time: 08/10/21  8:22 AM   Specimen: STOOL  Result Value Ref Range Status   C Diff antigen NEGATIVE NEGATIVE Final   C Diff toxin NEGATIVE NEGATIVE Final   C Diff interpretation No C. difficile detected.  Final    Comment: Performed at [facility], [address], [place], [place] [address]  Culture, blood (routine x 2)     Status: None (Preliminary result)   Collection Time: 08/10/21  2:32 PM   Specimen: Left Antecubital; Blood  Result Value Ref Range Status   Specimen Description LEFT ANTECUBITAL  Final   Special Requests   Final    BOTTLES DRAWN AEROBIC AND ANAEROBIC Blood Culture adequate volume    Culture   Final    NO GROWTH < 24 HOURS Performed at [facility], [address], [place],  [address]    Report Status PENDING  Incomplete  Culture, blood (routine x 2)     Status: None (Preliminary result)   Collection Time: 08/10/21  2:32 PM   Specimen: BLOOD LEFT HAND  Result Value Ref Range Status   Specimen Description BLOOD LEFT HAND  Final   Special Requests   Final    BOTTLES DRAWN AEROBIC AND ANAEROBIC Blood Culture results may not be optimal due to an excessive volume of blood received in culture bottles   Culture  Final    NO GROWTH < 24 HOURS Performed at [facility], [address] [place], [place] [address]    Report Status PENDING  Incomplete         Radiology Studies: CT ABDOMEN PELVIS W CONTRAST  Result Date: 08/10/2021 CLINICAL DATA:  Nausea vomiting. EXAM: CT ABDOMEN AND PELVIS WITH CONTRAST TECHNIQUE: Multidetector CT imaging of the abdomen and pelvis was performed using the standard protocol following bolus administration of intravenous contrast. RADIATION DOSE REDUCTION: This exam was performed according to the departmental dose-optimization program which includes automated exposure control, adjustment of the mA and/or kV according to patient size and/or use of iterative reconstruction technique. CONTRAST:  180m OMNIPAQUE IOHEXOL 300 MG/ML  SOLN COMPARISON:  CT abdomen pelvis dated 04/09/2021. FINDINGS: Lower chest: The visualized lung bases are clear. No intra-abdominal free air or free fluid. Hepatobiliary: Diffuse fatty liver. No intrahepatic biliary dilatation. Small gallstone. No pericholecystic fluid or evidence of acute cholecystitis by CT. Pancreas: Unremarkable. No pancreatic ductal dilatation or surrounding inflammatory changes. Spleen: Normal in size without focal abnormality. Adrenals/Urinary Tract: The adrenal glands unremarkable. There is no hydronephrosis on either side. There is symmetric enhancement and excretion of contrast by both  kidneys. The visualized ureters and urinary bladder appear unremarkable. 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 There is diffuse inflammatory changes and thickening of the colon consistent with colitis. There is no bowel obstruction. The appendix is normal. Vascular/Lymphatic: The abdominal aorta and IVC are unremarkable. No portal venous gas. Small scattered mesenteric lymph nodes. No adenopathy. Reproductive: The uterus is anteverted.  No adnexal masses. Other: Anterior pelvic wall incisional scar. Musculoskeletal: No acute osseous pathology. IMPRESSION: 1. Pancolitis. No bowel obstruction. Normal appendix. 2. Fatty liver. 3. Cholelithiasis. Electronically Signed   By: AAnner CreteM.D.   On: 08/10/2021 02:25   UKoreaAbdomen Limited  Result Date: 08/10/2021 CLINICAL DATA:  Evaluate for abscess EXAM: ULTRASOUND ABDOMEN LIMITED COMPARISON:  None. FINDINGS: Limited ultrasound of the area around the stoma demonstrates normal appearing bowel loops with no evidence of organized fluid collection. IMPRESSION: No evidence abscess. Electronically Signed   By: LYetta GlassmanM.D.   On: 08/10/2021 14:34        Scheduled Meds:  Chlorhexidine Gluconate Cloth  6 each Topical Daily   folic acid  1 mg Oral Daily   heparin  5,000 Units Subcutaneous Q8H   multivitamin with minerals  1 tablet Oral Daily   pantoprazole (PROTONIX) IV  40 mg Intravenous Q12H   temazepam  7.5 mg Oral QHS   thiamine  100 mg Oral Daily   Or   thiamine  100 mg Intravenous Daily   Continuous Infusions:  sodium chloride 150 mL/hr at 08/11/21 0418   ciprofloxacin 400 mg (08/11/21 1137)   metronidazole 500 mg (08/11/21 0845)   potassium chloride 10 mEq (08/11/21 1136)     LOS: 0 days    Time spent: 35 minutes    Andrea Ferrer DDarleen Crocker DO [facility]  If 7PM-7AM, please contact night-coverage [email or web address] 08/11/2021, 12:28 PM

## 2021-08-11 NOTE — Progress Notes (Addendum)
Initial Nutrition Assessment  DOCUMENTATION CODES:   Obesity unspecified  INTERVENTION:  Ensure Enlive po BID, each supplement provides 350 kcal and 20 grams of protein   Encourage meal and ONS intake  NUTRITION DIAGNOSIS:   Inadequate oral intake related to acute illness as evidenced by meal completion < 25%.   GOAL:  Provide needs based on ASPEN/SCCM guidelines   MONITOR:  PO intake, Labs, Skin, Weight trends, Supplement acceptance  REASON FOR ASSESSMENT:   Malnutrition Screening Tool    ASSESSMENT: Patient is a 51 yo hx of necrotizing fascitis s/p diverting colostomy. Presents with electrolyte disturbance, dehydration.  Patient provided soft foods. Lunch tray is here untouched. Talked with nursing. Patient ate a little of breakfast and has been sleeping. No family bedside.   Weights reviewed. Gain of 4 kg since 04/23/21-(75.8 kg). Currently weighs 80 kg.  Medications: folvite, mvi, thiamine and protonix and antibiotic.  IVF- NS@ 150 ml/hr    Intake/Output Summary (Last 24 hours) at 08/11/2021 1602 Last data filed at 08/11/2021 0321 Gross per 24 hour  Intake 2044.62 ml  Output 525 ml  Net 1519.62 ml     Labs: BMP Latest Ref Rng & Units 08/11/2021 08/10/2021 08/10/2021  Glucose 70 - 99 mg/dL 104(H) 86 134(H)  BUN 6 - 20 mg/dL <5(L) 8 11  Creatinine 0.44 - 1.00 mg/dL 0.41(L) 0.46 0.48  BUN/Creat Ratio 6 - 22 (calc) - - -  Sodium 135 - 145 mmol/L 135 130(L) 131(L)  Potassium 3.5 - 5.1 mmol/L 2.9(L) 2.8(L) 2.6(LL)  Chloride 98 - 111 mmol/L 108 98 94(L)  CO2 22 - 32 mmol/L 20(L) 21(L) 23  Calcium 8.9 - 10.3 mg/dL 7.8(L) 7.6(L) 8.9      NUTRITION - FOCUSED PHYSICAL EXAM: Unable to complete Nutrition-Focused physical exam at this time.    Diet Order:   Diet Order             DIET SOFT Room service appropriate? Yes; Fluid consistency: Thin  Diet effective now                   EDUCATION NEEDS:  Not appropriate for education at this time  Skin:  Skin  Assessment: Reviewed RN Assessment  Last BM:  1/26 liquid stool per nurisng -moderate amount  Height:   Ht Readings from Last 1 Encounters:  08/10/21 4' 10"  (1.473 m)    Weight:   Wt Readings from Last 1 Encounters:  08/11/21 80 kg    Ideal Body Weight:   45 kg  BMI:  Body mass index is 36.86 kg/m.  Estimated Nutritional Needs:   Kcal:  1500-1700  Protein:  90-99 gr  Fluid:  >1700 ml daily  Colman Cater MS,RD,CSG,LDN Contact: Shea Evans

## 2021-08-12 ENCOUNTER — Inpatient Hospital Stay (HOSPITAL_COMMUNITY): Payer: Self-pay

## 2021-08-12 LAB — BASIC METABOLIC PANEL
Anion gap: 5 (ref 5–15)
BUN: <5 mg/dL — ABNORMAL LOW (ref 6–20)
CO2: 20 mmol/L — ABNORMAL LOW (ref 22–32)
Calcium: 7.7 mg/dL — ABNORMAL LOW (ref 8.9–10.3)
Chloride: 106 mmol/L (ref 98–111)
Creatinine, Ser: 0.4 mg/dL — ABNORMAL LOW (ref 0.44–1.00)
GFR, Estimated: >60 mL/min (ref >60)
Glucose, Bld: 132 mg/dL — ABNORMAL HIGH (ref 70–99)
Potassium: 3.2 mmol/L — ABNORMAL LOW (ref 3.5–5.1)
Sodium: 131 mmol/L — ABNORMAL LOW (ref 135–145)

## 2021-08-12 LAB — D-DIMER, QUANTITATIVE: D-Dimer, Quant: 5.71 ug/mL-FEU — ABNORMAL HIGH (ref 0.00–0.50)

## 2021-08-12 MED ORDER — POTASSIUM CHLORIDE 10 MEQ/100ML IV SOLN
10.0000 meq | INTRAVENOUS | Status: AC
  Administered 2021-08-12 (×6): 10 meq via INTRAVENOUS
  Filled 2021-08-12 (×6): qty 100

## 2021-08-12 MED ORDER — POTASSIUM CHLORIDE CRYS ER 20 MEQ PO TBCR
40.0000 meq | EXTENDED_RELEASE_TABLET | Freq: Two times a day (BID) | ORAL | Status: DC
  Administered 2021-08-12 – 2021-08-16 (×9): 40 meq via ORAL
  Filled 2021-08-12 (×9): qty 2

## 2021-08-12 MED ORDER — IOHEXOL 350 MG/ML SOLN
75.0000 mL | Freq: Once | INTRAVENOUS | Status: AC | PRN
  Administered 2021-08-12: 75 mL via INTRAVENOUS

## 2021-08-12 NOTE — Plan of Care (Signed)
°  Problem: Education: Goal: Knowledge of General Education information will improve Description: Including pain rating scale, medication(s)/side effects and non-pharmacologic comfort measures Outcome: Progressing   Problem: Health Behavior/Discharge Planning: Goal: Ability to manage health-related needs will improve Outcome: Progressing   Problem: Clinical Measurements: Goal: Ability to maintain clinical measurements within normal limits will improve Outcome: Progressing Goal: Will remain free from infection Outcome: Progressing Goal: Diagnostic test results will improve Outcome: Progressing Goal: Respiratory complications will improve Outcome: Progressing Goal: Cardiovascular complication will be avoided Outcome: Progressing   Problem: Activity: Goal: Risk for activity intolerance will decrease Outcome: Progressing   Problem: Coping: Goal: Level of anxiety will decrease Outcome: Progressing   Problem: Elimination: Goal: Will not experience complications related to bowel motility Outcome: Progressing Goal: Will not experience complications related to urinary retention Outcome: Progressing   Problem: Pain Managment: Goal: General experience of comfort will improve Outcome: Progressing   Problem: Safety: Goal: Ability to remain free from injury will improve Outcome: Progressing   Problem: Skin Integrity: Goal: Risk for impaired skin integrity will decrease Outcome: Progressing

## 2021-08-12 NOTE — Progress Notes (Signed)
PROGRESS NOTE    Alyssa Carey  LAG:[id number] DOB: 12-Apr-1971 DOA: 08/09/2021 PCP: Caryl Bis, MD   Brief Narrative:   Alyssa Carey  is a 51 y.o. 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  Patient was admitted with pancolitis and associated dehydration as well as electrolyte abnormalities with hypokalemia and hypomagnesemia in the setting of ongoing high output/diarrhea.  She has been started on ciprofloxacin and Flagyl empirically on 1/25 and GI pathogen panel as well as C. difficile testing has been ordered.  She continues to require aggressive IV fluid hydration at this time.  Assessment & Plan:   Principal Problem:   Dehydration Active Problems:   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CC)   Hypokalemia   Hypoalbuminemia due to protein-calorie malnutrition (HCC)   Pancolitis -Started on ciprofloxacin and Flagyl empirically -GI pathogen panel negative and C. difficile testing negative -No recent use of antibiotics   Dehydration secondary to above -With noted high ostomy output -Continue aggressive IV fluid   Sinus tachycardia -Likely related to the dehydration as well as persistent electrolyte abnormalities -No sign of anxiety noted -Check TSH 0.6 -D-dimer noted to be 5.71, will check CT angiogram to assess for PE   Hypokalemia/hypomagnesemia -Replete and reevaluate -Maintain on telemetry   Moderate protein calorie malnutrition -Started on clear liquid diet   FOBT positive -Continue to monitor repeat CBC -No frank bleeding noted, continue to monitor -IV PPI for now due to PUD   History of PUD/alcoholic gastritis -IV PPI   Alcoholic liver disease -CIWA protocol     DVT prophylaxis: Heparin Code Status: Full Family Communication: None at bedside Disposition Plan:  Status is: Inpatient  Remains inpatient appropriate because: Continuous need for IV fluids  and medications.     Consultants:  General surgery   Procedures:  See below  Antimicrobials:  Anti-infectives (From admission, onward)    Start     Dose/Rate Route Frequency Ordered Stop   08/10/21 1000  ciprofloxacin (CIPRO) IVPB 400 mg        400 mg 200 mL/hr over 60 Minutes Intravenous Every 12 hours 08/10/21 0824     08/10/21 0915  metroNIDAZOLE (FLAGYL) IVPB 500 mg        500 mg 100 mL/hr over 60 Minutes Intravenous Every 8 hours 08/10/21 0824         Subjective: Patient seen and evaluated today with no new acute complaints or concerns. No acute concerns or events noted overnight.  Objective: Vitals:   08/12/21 0900 08/12/21 0930 08/12/21 1000 08/12/21 1100  BP: 109/71 114/69 106/65 107/67  Pulse:      Resp:      Temp:      TempSrc:      SpO2: 100%     Weight:      Height:        Intake/Output Summary (Last 24 hours) at 08/12/2021 1216 Last data filed at 08/12/2021 0939 Gross per 24 hour  Intake 4814.55 ml  Output 270 ml  Net 4544.55 ml   Filed Weights   08/10/21 0619 08/11/21 0502 08/12/21 0500  Weight: 73.2 kg 80 kg 82.7 kg    Examination:  General exam: Appears calm and comfortable  Respiratory system: Clear to auscultation. Respiratory effort normal. Cardiovascular system: S1 & S2 heard, RRR.  Tachycardic Gastrointestinal system: Abdomen is soft, parastomal hernia noted with ostomy bag with liquid stool and gas Central nervous system:  Alert and awake Extremities: No edema Skin: No significant lesions noted Psychiatry: Flat affect.    Data Reviewed: I have personally reviewed following labs and imaging studies  CBC: Recent Labs  Lab 08/10/21 0011 08/10/21 0359 08/10/21 1418 08/11/21 0422  WBC 10.8* 10.4 8.5 8.4  NEUTROABS 9.0* 7.9*  --   --   HGB 12.6 10.4* 9.5* 9.6*  HCT 36.9 31.3* 29.3* 29.8*  MCV 90.7 93.7 95.8 94.3  PLT 316 295 253 762   Basic Metabolic Panel: Recent Labs  Lab 08/10/21 0011 08/10/21 0359 08/11/21 0422   NA 131* 130* 135  K 2.6* 2.8* 2.9*  CL 94* 98 108  CO2 23 21* 20*  GLUCOSE 134* 86 104*  BUN 11 8 <5*  CREATININE 0.48 0.46 0.41*  CALCIUM 8.9 7.6* 7.8*  MG 1.7 1.6* 2.0   GFR: Estimated Creatinine Clearance: 78.2 mL/min (A) (by C-G formula based on SCr of 0.41 mg/dL (L)). Liver Function Tests: Recent Labs  Lab 08/10/21 0011 08/10/21 0359  AST 42* 32  ALT 25 21  ALKPHOS 124 97  BILITOT 1.1 1.0  PROT 7.3 6.0*  ALBUMIN 2.9* 2.3*   Recent Labs  Lab 08/10/21 0011  LIPASE 25   No results for input(s): AMMONIA in the last 168 hours. Coagulation Profile: No results for input(s): INR, PROTIME in the last 168 hours. Cardiac Enzymes: No results for input(s): CKTOTAL, CKMB, CKMBINDEX, TROPONINI in the last 168 hours. BNP (last 3 results) No results for input(s): PROBNP in the last 8760 hours. HbA1C: No results for input(s): HGBA1C in the last 72 hours. CBG: No results for input(s): GLUCAP in the last 168 hours. Lipid Profile: No results for input(s): CHOL, HDL, LDLCALC, TRIG, CHOLHDL, LDLDIRECT in the last 72 hours. Thyroid Function Tests: Recent Labs    08/11/21 0422  TSH 0.632   Anemia Panel: No results for input(s): VITAMINB12, FOLATE, FERRITIN, TIBC, IRON, RETICCTPCT in the last 72 hours. Sepsis Labs: Recent Labs  Lab 08/10/21 0359 08/10/21 1418 08/11/21 0152 08/11/21 0422  PROCALCITON 3.01  --   --   --   LATICACIDVEN 0.7 4.1* 1.4 1.1    Recent Results (from the past 240 hour(s))  Resp Panel by RT-PCR (Flu A&B, Covid) Nasopharyngeal Swab     Status: None   Collection Time: 08/09/21 12:11 AM   Specimen: Nasopharyngeal Swab; Nasopharyngeal(NP) swabs in vial transport medium  Result Value Ref Range Status   SARS Coronavirus 2 by RT PCR NEGATIVE NEGATIVE Final    Comment: (NOTE) SARS-CoV-2 target nucleic acids are NOT DETECTED.  The SARS-CoV-2 RNA is generally detectable in upper respiratory specimens during the acute phase of infection. The  lowest concentration of SARS-CoV-2 viral copies this assay can detect is 138 copies/mL. A negative result does not preclude SARS-Cov-2 infection and should not be used as the sole basis for treatment or other patient management decisions. A negative result may occur with  improper specimen collection/handling, submission of specimen other than nasopharyngeal swab, presence of viral mutation(s) within the areas targeted by this assay, and inadequate number of viral copies(<138 copies/mL). A negative result must be combined with clinical observations, patient history, and epidemiological information. The expected result is Negative.  Fact Sheet for Patients:  EntrepreneurPulse.com.au  Fact Sheet for Healthcare Providers:  IncredibleEmployment.be  This test is no t yet approved or cleared by the [place] FDA and  has been authorized for detection and/or diagnosis of SARS-CoV-2 by FDA under an Emergency Use Authorization (EUA). This EUA will  remain  in effect (meaning this test can be used) for the duration of the COVID-19 declaration under Section 564(b)(1) of the Act, 21 U.S.C.section 360bbb-3(b)(1), unless the authorization is terminated  or revoked sooner.       Influenza A by PCR NEGATIVE NEGATIVE Final   Influenza B by PCR NEGATIVE NEGATIVE Final    Comment: (NOTE) The Xpert Xpress SARS-CoV-2/FLU/RSV plus assay is intended as an aid in the diagnosis of influenza from Nasopharyngeal swab specimens and should not be used as a sole basis for treatment. Nasal washings and aspirates are unacceptable for Xpert Xpress SARS-CoV-2/FLU/RSV testing.  Fact Sheet for Patients: EntrepreneurPulse.com.au  Fact Sheet for Healthcare Providers: IncredibleEmployment.be  This test is not yet approved or cleared by the [place] FDA and has been authorized for detection and/or diagnosis of SARS-CoV-2 by FDA under  an Emergency Use Authorization (EUA). This EUA will remain in effect (meaning this test can be used) for the duration of the COVID-19 declaration under Section 564(b)(1) of the Act, 21 U.S.C. section 360bbb-3(b)(1), unless the authorization is terminated or revoked.  Performed at [facility], [address], [place], [place] [address]   MRSA Next Gen by PCR, Nasal     Status: None   Collection Time: 08/10/21  6:46 AM   Specimen: Nasal Mucosa; Nasal Swab  Result Value Ref Range Status   MRSA by PCR Next Gen NOT DETECTED NOT DETECTED Final    Comment: (NOTE) The GeneXpert MRSA Assay (FDA approved for NASAL specimens only), is one component of a comprehensive MRSA colonization surveillance program. It is not intended to diagnose MRSA infection nor to guide or monitor treatment for MRSA infections. Test performance is not FDA approved in patients less than 93 years old. Performed at [facility], [address], [place], [place] [address]   C Difficile Quick Screen w PCR reflex     Status: None   Collection Time: 08/10/21  8:22 AM   Specimen: STOOL  Result Value Ref Range Status   C Diff antigen NEGATIVE NEGATIVE Final   C Diff toxin NEGATIVE NEGATIVE Final   C Diff interpretation No C. difficile detected.  Final    Comment: Performed at [facility], [address], [place], [place] [address]  Gastrointestinal Panel by PCR , Stool     Status: None   Collection Time: 08/10/21  8:22 AM   Specimen: STOOL  Result Value Ref Range Status   Campylobacter species NOT DETECTED NOT DETECTED Final   Plesimonas shigelloides NOT DETECTED NOT DETECTED Final   Salmonella species NOT DETECTED NOT DETECTED Final   Yersinia enterocolitica NOT DETECTED NOT DETECTED Final   Vibrio species NOT DETECTED NOT DETECTED Final   Vibrio cholerae NOT DETECTED NOT DETECTED Final   Enteroaggregative E coli (EAEC) NOT DETECTED NOT DETECTED Final   Enteropathogenic E coli (EPEC) NOT DETECTED NOT DETECTED Final    Enterotoxigenic E coli (ETEC) NOT DETECTED NOT DETECTED Final   Shiga like toxin producing E coli (STEC) NOT DETECTED NOT DETECTED Final   Shigella/Enteroinvasive E coli (EIEC) NOT DETECTED NOT DETECTED Final   Cryptosporidium NOT DETECTED NOT DETECTED Final   Cyclospora cayetanensis NOT DETECTED NOT DETECTED Final   Entamoeba histolytica NOT DETECTED NOT DETECTED Final   Giardia lamblia NOT DETECTED NOT DETECTED Final   Adenovirus F40/41 NOT DETECTED NOT DETECTED Final   Astrovirus NOT DETECTED NOT DETECTED Final   Norovirus GI/GII NOT DETECTED NOT DETECTED Final   Rotavirus A NOT DETECTED NOT DETECTED Final  Sapovirus (I, II, IV, and V) NOT DETECTED NOT DETECTED Final    Comment: Performed at [facility], [address] [place], [place] [address]  Culture, blood (routine x 2)     Status: None (Preliminary result)   Collection Time: 08/10/21  2:32 PM   Specimen: Left Antecubital; Blood  Result Value Ref Range Status   Specimen Description LEFT ANTECUBITAL  Final   Special Requests   Final    BOTTLES DRAWN AEROBIC AND ANAEROBIC Blood Culture adequate volume   Culture   Final    NO GROWTH 1 DAY Performed at [facility], [address] [place], [place] [address]    Report Status PENDING  Incomplete  Culture, blood (routine x 2)     Status: None (Preliminary result)   Collection Time: 08/10/21  2:32 PM   Specimen: BLOOD LEFT HAND  Result Value Ref Range Status   Specimen Description BLOOD LEFT HAND  Final   Special Requests   Final    BOTTLES DRAWN AEROBIC AND ANAEROBIC Blood Culture results may not be optimal due to an excessive volume of blood received in culture bottles   Culture   Final    NO GROWTH 1 DAY Performed at [facility], [address] [place], [place] [address]    Report Status PENDING  Incomplete         Radiology Studies: US Abdomen Limited  Result Date: 08/10/2021 CLINICAL DATA:  Evaluate for abscess EXAM: ULTRASOUND ABDOMEN LIMITED  COMPARISON:  None. FINDINGS: Limited ultrasound of the area around the stoma demonstrates normal appearing bowel loops with no evidence of organized fluid collection. IMPRESSION: No evidence abscess. Electronically Signed   By: Yetta Glassman M.D.   On: 08/10/2021 14:34        Scheduled Meds:  Chlorhexidine Gluconate Cloth  6 each Topical Daily   folic acid  1 mg Oral Daily   heparin  5,000 Units Subcutaneous Q8H   multivitamin with minerals  1 tablet Oral Daily   pantoprazole (PROTONIX) IV  40 mg Intravenous Q12H   potassium chloride  40 mEq Oral BID   temazepam  7.5 mg Oral QHS   thiamine  100 mg Oral Daily   Or   thiamine  100 mg Intravenous Daily   Continuous Infusions:  sodium chloride Stopped (08/12/21 0912)   ciprofloxacin 200 mL/hr at 08/12/21 0939   metronidazole 100 mL/hr at 08/12/21 0939   potassium chloride 10 mEq (08/12/21 1126)     LOS: 1 day    Time spent: 35 minutes    Cynitha Berte Darleen Crocker, DO [facility]  If 7PM-7AM, please contact night-coverage [email or web address] 08/12/2021, 12:16 PM

## 2021-08-12 NOTE — Progress Notes (Signed)
[facility] Progress Note     Subjective: Patient seen and examined.  She is resting comfortably in bed.  She states that her abdominal pain is intermittent, but she does not have pain at this time.  She had an episode of emesis overnight when eating her chicken.  Otherwise, she denies nausea.  She continues to move her bowels through her ostomy.  Objective: Vital signs in last 24 hours: Temp:  [98.9 F (37.2 C)-99.6 F (37.6 C)] 98.9 F (37.2 C) (01/27 0800) Pulse Rate:  [109-125] 118 (01/27 0600) Resp:  [22-48] 40 (01/27 0600) BP: (97-136)/(55-81) 106/65 (01/27 1000) SpO2:  [96 %-100 %] 100 % (01/27 0900) Weight:  [82.7 kg] 82.7 kg (01/27 0500) Last BM Date: 08/12/21  Intake/Output from previous day: 01/26 0701 - 01/27 0700 In: 3786.1 [I.V.:2486; IV Piggyback:1300.1] Out: 250 [Stool:250] Intake/Output this shift: Total I/O In: 1028.5 [P.O.:120; I.V.:771.5; IV Piggyback:137] Out: 20 [Stool:20]  General appearance: alert, cooperative, and no distress GI: Soft, nondistended, no percussion tenderness, nontender to palpation, parastomal hernia is large but soft, minimal tenderness to palpation, ostomy pink and patent with liquid stool in bag, recently emptied  Lab Results:  Recent Labs    08/10/21 1418 08/11/21 0422  WBC 8.5 8.4  HGB 9.5* 9.6*  HCT 29.3* 29.8*  PLT 253 275   BMET Recent Labs    08/10/21 0359 08/11/21 0422  NA 130* 135  K 2.8* 2.9*  CL 98 108  CO2 21* 20*  GLUCOSE 86 104*  BUN 8 <5*  CREATININE 0.46 0.41*  CALCIUM 7.6* 7.8*   PT/INR No results for input(s): LABPROT, INR in the last 72 hours.  Studies/Results: US Abdomen Limited  Result Date: 08/10/2021 CLINICAL DATA:  Evaluate for abscess EXAM: ULTRASOUND ABDOMEN LIMITED COMPARISON:  None. FINDINGS: Limited ultrasound of the area around the stoma demonstrates normal appearing bowel loops with no evidence of organized fluid collection. IMPRESSION: No evidence abscess.  Electronically Signed   By: Yetta Glassman M.D.   On: 08/10/2021 14:34    Anti-infectives: Anti-infectives (From admission, onward)    Start     Dose/Rate Route Frequency Ordered Stop   08/10/21 1000  ciprofloxacin (CIPRO) IVPB 400 mg        400 mg 200 mL/hr over 60 Minutes Intravenous Every 12 hours 08/10/21 0824     08/10/21 0915  metroNIDAZOLE (FLAGYL) IVPB 500 mg        500 mg 100 mL/hr over 60 Minutes Intravenous Every 8 hours 08/10/21 4825         Assessment/Plan:  Patient is a 51 year old female who was admitted with high ostomy output, dehydration, and pancolitis  -Parastomal hernia continues to remain soft without evidence of obstruction -Patient with continued bowel function via ostomy -No acute surgical intervention at this time -Please call with any concerns -Care per primary team   LOS: 1 day    Erikka Follmer A Melchor Kirchgessner 08/12/2021

## 2021-08-13 LAB — BASIC METABOLIC PANEL
Anion gap: 5 (ref 5–15)
BUN: <5 mg/dL — ABNORMAL LOW (ref 6–20)
CO2: 22 mmol/L (ref 22–32)
Calcium: 8.3 mg/dL — ABNORMAL LOW (ref 8.9–10.3)
Chloride: 109 mmol/L (ref 98–111)
Creatinine, Ser: 0.35 mg/dL — ABNORMAL LOW (ref 0.44–1.00)
GFR, Estimated: >60 mL/min (ref >60)
Glucose, Bld: 104 mg/dL — ABNORMAL HIGH (ref 70–99)
Potassium: 3.9 mmol/L (ref 3.5–5.1)
Sodium: 136 mmol/L (ref 135–145)

## 2021-08-13 LAB — MAGNESIUM: Magnesium: 1.6 mg/dL — ABNORMAL LOW (ref 1.7–2.4)

## 2021-08-13 MED ORDER — ALUM & MAG HYDROXIDE-SIMETH 200-200-20 MG/5ML PO SUSP
30.0000 mL | ORAL | Status: DC | PRN
  Administered 2021-08-13 – 2021-08-16 (×7): 30 mL via ORAL
  Filled 2021-08-13 (×7): qty 30

## 2021-08-13 MED ORDER — MAGNESIUM SULFATE 2 GM/50ML IV SOLN
2.0000 g | Freq: Once | INTRAVENOUS | Status: AC
  Administered 2021-08-13: 2 g via INTRAVENOUS
  Filled 2021-08-13: qty 50

## 2021-08-13 NOTE — Progress Notes (Signed)
PROGRESS NOTE    Alyssa Carey  XBD:[id number] DOB: 12-19-1970 DOA: 08/09/2021 PCP: Caryl Bis, MD   Brief Narrative:   Alyssa Carey  is a 51 y.o. 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  Patient was admitted with pancolitis and associated dehydration as well as electrolyte abnormalities with hypokalemia and hypomagnesemia in the setting of ongoing high output/diarrhea.  She has been started on ciprofloxacin and Flagyl empirically on 1/25 and GI pathogen panel as well as C. difficile testing has been ordered.  She continues to require aggressive IV fluid hydration at this time.  Assessment & Plan:   Principal Problem:   Dehydration Active Problems:   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CC)   Hypokalemia   Hypoalbuminemia due to protein-calorie malnutrition (HCC)   Pancolitis -Started on ciprofloxacin and Flagyl empirically -GI pathogen panel negative and C. difficile testing negative -No recent use of antibiotics -Stool output decreasing, maintain on soft diet   Dehydration secondary to above -With noted high ostomy output, now decreasing -Continue IV fluid at decreased rate   Sinus tachycardia -Likely related to the dehydration as well as persistent electrolyte abnormalities -No sign of anxiety noted -Check TSH 0.6 -D-dimer noted to be 5.71, will CT angiogram negative for PE   Hypomagnesemia -Replete and reevaluate -Maintain on telemetry   Moderate protein calorie malnutrition -Started on clear liquid diet   FOBT positive -Continue to monitor repeat CBC -No frank bleeding noted, continue to monitor -IV PPI for now due to PUD   History of PUD/alcoholic gastritis -IV PPI   Alcoholic liver disease -CIWA protocol     DVT prophylaxis: Heparin Code Status: Full Family Communication: None at bedside Disposition Plan:  Status is: Inpatient   Remains  inpatient appropriate because: Continuous need for IV fluids and medications.       Consultants:  General surgery   Procedures:  See below  Antimicrobials:  Anti-infectives (From admission, onward)    Start     Dose/Rate Route Frequency Ordered Stop   08/10/21 1000  ciprofloxacin (CIPRO) IVPB 400 mg        400 mg 200 mL/hr over 60 Minutes Intravenous Every 12 hours 08/10/21 0824     08/10/21 0915  metroNIDAZOLE (FLAGYL) IVPB 500 mg        500 mg 100 mL/hr over 60 Minutes Intravenous Every 8 hours 08/10/21 0824         Subjective: Patient seen and evaluated today with decreasing stool output noted per ostomy.  She is tolerating diet, continues to remain tachycardic, but otherwise asymptomatic.  Objective: Vitals:   08/13/21 0756 08/13/21 0900 08/13/21 0944 08/13/21 1000  BP:  103/76  101/66  Pulse: (!) 119     Resp: (!) 36     Temp: 98.3 F (36.8 C)  98.4 F (36.9 C)   TempSrc: Oral  Oral   SpO2: 100%     Weight:      Height:        Intake/Output Summary (Last 24 hours) at 08/13/2021 1110 Last data filed at 08/13/2021 1043 Gross per 24 hour  Intake 4584.26 ml  Output 100 ml  Net 4484.26 ml   Filed Weights   08/11/21 0502 08/12/21 0500 08/13/21 0619  Weight: 80 kg 82.7 kg 83.7 kg    Examination:  General exam: Appears calm and comfortable  Respiratory system: Clear to auscultation. Respiratory effort normal. Cardiovascular system: S1 &  S2 heard, RRR.  Tachycardic Gastrointestinal system: Abdomen is soft, ostomy with liquid and gas output with pink stump.  Surrounding distention remarkable for hernia-stable Central nervous system: Alert and awake Extremities: No edema Skin: No significant lesions noted Psychiatry: Flat affect.    Data Reviewed: I have personally reviewed following labs and imaging studies  CBC: Recent Labs  Lab 08/10/21 0011 08/10/21 0359 08/10/21 1418 08/11/21 0422  WBC 10.8* 10.4 8.5 8.4  NEUTROABS 9.0* 7.9*  --   --   HGB  12.6 10.4* 9.5* 9.6*  HCT 36.9 31.3* 29.3* 29.8*  MCV 90.7 93.7 95.8 94.3  PLT 316 295 253 476   Basic Metabolic Panel: Recent Labs  Lab 08/10/21 0011 08/10/21 0359 08/11/21 0422 08/12/21 1111 08/13/21 0549  NA 131* 130* 135 131* 136  K 2.6* 2.8* 2.9* 3.2* 3.9  CL 94* 98 108 106 109  CO2 23 21* 20* 20* 22  GLUCOSE 134* 86 104* 132* 104*  BUN 11 8 <5* <5* <5*  CREATININE 0.48 0.46 0.41* 0.40* 0.35*  CALCIUM 8.9 7.6* 7.8* 7.7* 8.3*  MG 1.7 1.6* 2.0  --  1.6*   GFR: Estimated Creatinine Clearance: 78.7 mL/min (A) (by C-G formula based on SCr of 0.35 mg/dL (L)). Liver Function Tests: Recent Labs  Lab 08/10/21 0011 08/10/21 0359  AST 42* 32  ALT 25 21  ALKPHOS 124 97  BILITOT 1.1 1.0  PROT 7.3 6.0*  ALBUMIN 2.9* 2.3*   Recent Labs  Lab 08/10/21 0011  LIPASE 25   No results for input(s): AMMONIA in the last 168 hours. Coagulation Profile: No results for input(s): INR, PROTIME in the last 168 hours. Cardiac Enzymes: No results for input(s): CKTOTAL, CKMB, CKMBINDEX, TROPONINI in the last 168 hours. BNP (last 3 results) No results for input(s): PROBNP in the last 8760 hours. HbA1C: No results for input(s): HGBA1C in the last 72 hours. CBG: No results for input(s): GLUCAP in the last 168 hours. Lipid Profile: No results for input(s): CHOL, HDL, LDLCALC, TRIG, CHOLHDL, LDLDIRECT in the last 72 hours. Thyroid Function Tests: Recent Labs    08/11/21 0422  TSH 0.632   Anemia Panel: No results for input(s): VITAMINB12, FOLATE, FERRITIN, TIBC, IRON, RETICCTPCT in the last 72 hours. Sepsis Labs: Recent Labs  Lab 08/10/21 0359 08/10/21 1418 08/11/21 0152 08/11/21 0422  PROCALCITON 3.01  --   --   --   LATICACIDVEN 0.7 4.1* 1.4 1.1    Recent Results (from the past 240 hour(s))  Resp Panel by RT-PCR (Flu A&B, Covid) Nasopharyngeal Swab     Status: None   Collection Time: 08/09/21 12:11 AM   Specimen: Nasopharyngeal Swab; Nasopharyngeal(NP) swabs in vial  transport medium  Result Value Ref Range Status   SARS Coronavirus 2 by RT PCR NEGATIVE NEGATIVE Final    Comment: (NOTE) SARS-CoV-2 target nucleic acids are NOT DETECTED.  The SARS-CoV-2 RNA is generally detectable in upper respiratory specimens during the acute phase of infection. The lowest concentration of SARS-CoV-2 viral copies this assay can detect is 138 copies/mL. A negative result does not preclude SARS-Cov-2 infection and should not be used as the sole basis for treatment or other patient management decisions. A negative result may occur with  improper specimen collection/handling, submission of specimen other than nasopharyngeal swab, presence of viral mutation(s) within the areas targeted by this assay, and inadequate number of viral copies(<138 copies/mL). A negative result must be combined with clinical observations, patient history, and epidemiological information. The expected result is Negative.  Fact Sheet for Patients:  EntrepreneurPulse.com.au  Fact Sheet for Healthcare Providers:  IncredibleEmployment.be  This test is no t yet approved or cleared by the [place] FDA and  has been authorized for detection and/or diagnosis of SARS-CoV-2 by FDA under an Emergency Use Authorization (EUA). This EUA will remain  in effect (meaning this test can be used) for the duration of the COVID-19 declaration under Section 564(b)(1) of the Act, 21 U.S.C.section 360bbb-3(b)(1), unless the authorization is terminated  or revoked sooner.       Influenza A by PCR NEGATIVE NEGATIVE Final   Influenza B by PCR NEGATIVE NEGATIVE Final    Comment: (NOTE) The Xpert Xpress SARS-CoV-2/FLU/RSV plus assay is intended as an aid in the diagnosis of influenza from Nasopharyngeal swab specimens and should not be used as a sole basis for treatment. Nasal washings and aspirates are unacceptable for Xpert Xpress SARS-CoV-2/FLU/RSV testing.  Fact  Sheet for Patients: EntrepreneurPulse.com.au  Fact Sheet for Healthcare Providers: IncredibleEmployment.be  This test is not yet approved or cleared by the [place] FDA and has been authorized for detection and/or diagnosis of SARS-CoV-2 by FDA under an Emergency Use Authorization (EUA). This EUA will remain in effect (meaning this test can be used) for the duration of the COVID-19 declaration under Section 564(b)(1) of the Act, 21 U.S.C. section 360bbb-3(b)(1), unless the authorization is terminated or revoked.  Performed at [facility], [address] [place], [place] [address]   MRSA Next Gen by PCR, Nasal     Status: None   Collection Time: 08/10/21  6:46 AM   Specimen: Nasal Mucosa; Nasal Swab  Result Value Ref Range Status   MRSA by PCR Next Gen NOT DETECTED NOT DETECTED Final    Comment: (NOTE) The GeneXpert MRSA Assay (FDA approved for NASAL specimens only), is one component of a comprehensive MRSA colonization surveillance program. It is not intended to diagnose MRSA infection nor to guide or monitor treatment for MRSA infections. Test performance is not FDA approved in patients less than 59 years old. Performed at [facility], [address], [place], [place] [address]   C Difficile Quick Screen w PCR reflex     Status: None   Collection Time: 08/10/21  8:22 AM   Specimen: STOOL  Result Value Ref Range Status   C Diff antigen NEGATIVE NEGATIVE Final   C Diff toxin NEGATIVE NEGATIVE Final   C Diff interpretation No C. difficile detected.  Final    Comment: Performed at [facility], [address], [place], [place] [address]  Gastrointestinal Panel by PCR , Stool     Status: None   Collection Time: 08/10/21  8:22 AM   Specimen: STOOL  Result Value Ref Range Status   Campylobacter species NOT DETECTED NOT DETECTED Final   Plesimonas shigelloides NOT DETECTED NOT DETECTED Final   Salmonella species NOT DETECTED NOT DETECTED  Final   Yersinia enterocolitica NOT DETECTED NOT DETECTED Final   Vibrio species NOT DETECTED NOT DETECTED Final   Vibrio cholerae NOT DETECTED NOT DETECTED Final   Enteroaggregative E coli (EAEC) NOT DETECTED NOT DETECTED Final   Enteropathogenic E coli (EPEC) NOT DETECTED NOT DETECTED Final   Enterotoxigenic E coli (ETEC) NOT DETECTED NOT DETECTED Final   Shiga like toxin producing E coli (STEC) NOT DETECTED NOT DETECTED Final   Shigella/Enteroinvasive E coli (EIEC) NOT DETECTED NOT DETECTED Final   Cryptosporidium NOT DETECTED NOT DETECTED Final   Cyclospora cayetanensis NOT DETECTED NOT DETECTED Final  Entamoeba histolytica NOT DETECTED NOT DETECTED Final   Giardia lamblia NOT DETECTED NOT DETECTED Final   Adenovirus F40/41 NOT DETECTED NOT DETECTED Final   Astrovirus NOT DETECTED NOT DETECTED Final   Norovirus GI/GII NOT DETECTED NOT DETECTED Final   Rotavirus A NOT DETECTED NOT DETECTED Final   Sapovirus (I, II, IV, and V) NOT DETECTED NOT DETECTED Final    Comment: Performed at [facility], [address] [place], [place] [address]  Culture, blood (routine x 2)     Status: None (Preliminary result)   Collection Time: 08/10/21  2:32 PM   Specimen: Left Antecubital; Blood  Result Value Ref Range Status   Specimen Description LEFT ANTECUBITAL  Final   Special Requests   Final    BOTTLES DRAWN AEROBIC AND ANAEROBIC Blood Culture adequate volume   Culture   Final    NO GROWTH 1 DAY Performed at [facility], [address] [place], [place] [address]    Report Status PENDING  Incomplete  Culture, blood (routine x 2)     Status: None (Preliminary result)   Collection Time: 08/10/21  2:32 PM   Specimen: BLOOD LEFT HAND  Result Value Ref Range Status   Specimen Description BLOOD LEFT HAND  Final   Special Requests   Final    BOTTLES DRAWN AEROBIC AND ANAEROBIC Blood Culture results may not be optimal due to an excessive volume of blood received in culture bottles    Culture   Final    NO GROWTH 1 DAY Performed at [facility], [address] [place], [place] [address]    Report Status PENDING  Incomplete         Radiology Studies: CT Angio Chest Pulmonary Embolism (PE) W or WO Contrast  Result Date: 08/12/2021 CLINICAL DATA:  Tachycardia with elevated D-dimer levels. History of hypertension. Clinical concern for pulmonary embolism. EXAM: CT ANGIOGRAPHY CHEST WITH CONTRAST TECHNIQUE: Multidetector CT imaging of the chest was performed using the standard protocol during bolus administration of intravenous contrast. Multiplanar CT image reconstructions and MIPs were obtained to evaluate the vascular anatomy. RADIATION DOSE REDUCTION: This exam was performed according to the departmental dose-optimization program which includes automated exposure control, adjustment of the mA and/or kV according to patient size and/or use of iterative reconstruction technique. CONTRAST:  65m OMNIPAQUE IOHEXOL 350 MG/ML SOLN COMPARISON:  Chest CT 01/13/2020. FINDINGS: Cardiovascular: The pulmonary arteries are suboptimally opacified with contrast due to preferential opacification of the aorta and mild breathing artifact. There is no evidence of acute pulmonary embolism. No acute systemic arterial abnormalities are identified. The heart size is stable. There is no pericardial effusion. Mediastinum/Nodes: There are no enlarged mediastinal, hilar or axillary lymph nodes. The thyroid gland, trachea and esophagus demonstrate no significant findings. Lungs/Pleura: The previously demonstrated bilateral pleural effusions have resolved, and there is improved aeration of both lungs. There is mild residual dependent atelectasis in both lungs. No airspace disease or suspicious pulmonary nodularity identified. Upper abdomen: The liver demonstrates diffusely decreased density consistent with steatosis. No focal abnormality identified. No acute findings are seen within the visualized upper abdomen.  Musculoskeletal/Chest wall: There is no chest wall mass or suspicious osseous finding. Generalized soft tissue edema present previously has improved. Review of the MIP images confirms the above findings. IMPRESSION: 1. No evidence of acute pulmonary embolism or other acute findings within the chest. 2. Interval resolution of previously demonstrated pleural effusions with improved atelectasis in both lungs. 3. Hepatic steatosis. Electronically Signed   By:  Richardean Sale M.D.   On: 08/12/2021 14:23        Scheduled Meds:  Chlorhexidine Gluconate Cloth  6 each Topical Daily   folic acid  1 mg Oral Daily   heparin  5,000 Units Subcutaneous Q8H   multivitamin with minerals  1 tablet Oral Daily   pantoprazole (PROTONIX) IV  40 mg Intravenous Q12H   potassium chloride  40 mEq Oral BID   temazepam  7.5 mg Oral QHS   thiamine  100 mg Oral Daily   Or   thiamine  100 mg Intravenous Daily   Continuous Infusions:  sodium chloride 75 mL/hr at 08/13/21 1043   ciprofloxacin 200 mL/hr at 08/13/21 1043   metronidazole Stopped (08/13/21 0957)     LOS: 2 days    Time spent: 35 minutes    Mardell Suttles Darleen Crocker, DO [facility]  If 7PM-7AM, please contact night-coverage [email or web address] 08/13/2021, 11:10 AM

## 2021-08-13 NOTE — Plan of Care (Signed)
°  Problem: Education: Goal: Knowledge of General Education information will improve Description: Including pain rating scale, medication(s)/side effects and non-pharmacologic comfort measures Outcome: Progressing   Problem: Health Behavior/Discharge Planning: Goal: Ability to manage health-related needs will improve Outcome: Progressing   Problem: Clinical Measurements: Goal: Ability to maintain clinical measurements within normal limits will improve Outcome: Progressing Goal: Will remain free from infection Outcome: Progressing Goal: Diagnostic test results will improve Outcome: Progressing Goal: Respiratory complications will improve Outcome: Progressing Goal: Cardiovascular complication will be avoided Outcome: Progressing   Problem: Activity: Goal: Risk for activity intolerance will decrease Outcome: Progressing   Problem: Coping: Goal: Level of anxiety will decrease Outcome: Progressing   Problem: Elimination: Goal: Will not experience complications related to bowel motility Outcome: Progressing Goal: Will not experience complications related to urinary retention Outcome: Progressing   Problem: Pain Managment: Goal: General experience of comfort will improve Outcome: Progressing   Problem: Safety: Goal: Ability to remain free from injury will improve Outcome: Progressing   Problem: Skin Integrity: Goal: Risk for impaired skin integrity will decrease Outcome: Progressing

## 2021-08-14 ENCOUNTER — Other Ambulatory Visit (HOSPITAL_COMMUNITY): Payer: Self-pay | Admitting: *Deleted

## 2021-08-14 ENCOUNTER — Inpatient Hospital Stay (HOSPITAL_COMMUNITY): Payer: Medicaid Other

## 2021-08-14 DIAGNOSIS — R9431 Abnormal electrocardiogram [ECG] [EKG]: Secondary | ICD-10-CM

## 2021-08-14 LAB — BASIC METABOLIC PANEL
Anion gap: 8 (ref 5–15)
BUN: <5 mg/dL — ABNORMAL LOW (ref 6–20)
CO2: 21 mmol/L — ABNORMAL LOW (ref 22–32)
Calcium: 8.5 mg/dL — ABNORMAL LOW (ref 8.9–10.3)
Chloride: 107 mmol/L (ref 98–111)
Creatinine, Ser: 0.39 mg/dL — ABNORMAL LOW (ref 0.44–1.00)
GFR, Estimated: >60 mL/min (ref >60)
Glucose, Bld: 113 mg/dL — ABNORMAL HIGH (ref 70–99)
Potassium: 4.2 mmol/L (ref 3.5–5.1)
Sodium: 136 mmol/L (ref 135–145)

## 2021-08-14 LAB — CBC
HCT: 34 % — ABNORMAL LOW (ref 36.0–46.0)
Hemoglobin: 10.9 g/dL — ABNORMAL LOW (ref 12.0–15.0)
MCH: 30.4 pg (ref 26.0–34.0)
MCHC: 32.1 g/dL (ref 30.0–36.0)
MCV: 94.7 fL (ref 80.0–100.0)
Platelets: 412 10*3/uL — ABNORMAL HIGH (ref 150–400)
RBC: 3.59 MIL/uL — ABNORMAL LOW (ref 3.87–5.11)
RDW: 15.9 % — ABNORMAL HIGH (ref 11.5–15.5)
WBC: 11.7 10*3/uL — ABNORMAL HIGH (ref 4.0–10.5)
nRBC: 0 % (ref 0.0–0.2)

## 2021-08-14 LAB — MAGNESIUM: Magnesium: 1.8 mg/dL (ref 1.7–2.4)

## 2021-08-14 NOTE — Plan of Care (Signed)
°  Problem: Education: Goal: Knowledge of General Education information will improve Description: Including pain rating scale, medication(s)/side effects and non-pharmacologic comfort measures Outcome: Progressing   Problem: Health Behavior/Discharge Planning: Goal: Ability to manage health-related needs will improve Outcome: Progressing   Problem: Clinical Measurements: Goal: Ability to maintain clinical measurements within normal limits will improve Outcome: Progressing Goal: Will remain free from infection Outcome: Progressing Goal: Diagnostic test results will improve Outcome: Progressing Goal: Respiratory complications will improve Outcome: Progressing Goal: Cardiovascular complication will be avoided Outcome: Progressing   Problem: Activity: Goal: Risk for activity intolerance will decrease Outcome: Progressing   Problem: Coping: Goal: Level of anxiety will decrease Outcome: Progressing   Problem: Elimination: Goal: Will not experience complications related to bowel motility Outcome: Progressing Goal: Will not experience complications related to urinary retention Outcome: Progressing   Problem: Pain Managment: Goal: General experience of comfort will improve Outcome: Progressing   Problem: Safety: Goal: Ability to remain free from injury will improve Outcome: Progressing   Problem: Skin Integrity: Goal: Risk for impaired skin integrity will decrease Outcome: Progressing

## 2021-08-14 NOTE — Progress Notes (Signed)
°  Echocardiogram 2D Echocardiogram has been performed.  Johny Chess 08/14/2021, 2:07 PM

## 2021-08-14 NOTE — Progress Notes (Signed)
PROGRESS NOTE    Alyssa Carey  YDX:[id number] DOB: [birth year] DOA: 08/09/2021 PCP: Caryl Bis, MD   Brief Narrative:   Alyssa Carey  is a 51 y.o. 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  Patient was admitted with pancolitis and associated dehydration as well as electrolyte abnormalities with hypokalemia and hypomagnesemia in the setting of ongoing high output/diarrhea.  She has been started on ciprofloxacin and Flagyl empirically on 1/25 and GI pathogen panel as well as C. difficile testing has been ordered.  She continues to require aggressive IV fluid hydration at this time.  Assessment & Plan:   Principal Problem:   Dehydration Active Problems:   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CC)   Hypokalemia   Hypoalbuminemia due to protein-calorie malnutrition (HCC)   Pancolitis -Started on ciprofloxacin and Flagyl empirically (day 5/10) -GI pathogen panel negative and C. difficile testing negative -No recent use of antibiotics -Stool output decreasing, maintain on soft diet   Dehydration secondary to above -With noted high ostomy output, now decreasing -Continue IV fluid at decreased rate   Sinus tachycardia -Likely related to the dehydration as well as persistent electrolyte abnormalities -No sign of anxiety noted -Check TSH 0.6 -D-dimer noted to be 5.71, will CT angiogram negative for PE -Check urine tox and echo 1/29   Hypomagnesemia -Replete and reevaluate -Maintain on telemetry   Moderate protein calorie malnutrition -Started on clear liquid diet   FOBT positive -Continue to monitor repeat CBC -No frank bleeding noted, continue to monitor -IV PPI for now due to PUD   History of PUD/alcoholic gastritis -IV PPI   Alcoholic liver disease -CIWA protocol     DVT prophylaxis: Heparin Code Status: Full Family Communication: None at bedside Disposition Plan:   Status is: Inpatient   Remains inpatient appropriate because: Continuous need for IV fluids and medications.       Consultants:  General surgery   Procedures:  See below  Antimicrobials:  Anti-infectives (From admission, onward)    Start     Dose/Rate Route Frequency Ordered Stop   08/10/21 1000  ciprofloxacin (CIPRO) IVPB 400 mg        400 mg 200 mL/hr over 60 Minutes Intravenous Every 12 hours 08/10/21 0824     08/10/21 0915  metroNIDAZOLE (FLAGYL) IVPB 500 mg        500 mg 100 mL/hr over 60 Minutes Intravenous Every 8 hours 08/10/21 0824           Subjective: Patient seen and evaluated today with some ongoing abdominal pain and minimal nausea. Ongoing tachycardia noted. No chest pain, no dyspnea, or palpitation noted.  Objective: Vitals:   08/14/21 0700 08/14/21 0758 08/14/21 0800 08/14/21 1000  BP: 97/76  111/74 119/77  Pulse:   (!) 126 (!) 123  Resp:   (!) 26 (!) 27  Temp:  98.6 F (37 C)    TempSrc:  Oral    SpO2: 98%  99% 99%  Weight:      Height:        Intake/Output Summary (Last 24 hours) at 08/14/2021 1200 Last data filed at 08/14/2021 0842 Gross per 24 hour  Intake 2641.58 ml  Output 1250 ml  Net 1391.58 ml   Filed Weights   08/12/21 0500 08/13/21 0619 08/14/21 0500  Weight: 82.7 kg 83.7 kg 85 kg    Examination:  General exam: Appears calm and comfortable  Respiratory system:  Clear to auscultation. Respiratory effort normal. Cardiovascular system: S1 & S2 heard, RRR. Tachycardic Gastrointestinal system: Abdomen is soft; parastomal hernia-soft; ostomy with fluid and gas output noted Central nervous system: Alert and awake Extremities: No edema Skin: No significant lesions noted Psychiatry: Flat affect.    Data Reviewed: I have personally reviewed following labs and imaging studies  CBC: Recent Labs  Lab 08/10/21 0011 08/10/21 0359 08/10/21 1418 08/11/21 0422 08/14/21 0436  WBC 10.8* 10.4 8.5 8.4 11.7*  NEUTROABS 9.0* 7.9*  --    --   --   HGB 12.6 10.4* 9.5* 9.6* 10.9*  HCT 36.9 31.3* 29.3* 29.8* 34.0*  MCV 90.7 93.7 95.8 94.3 94.7  PLT 316 295 253 275 735*   Basic Metabolic Panel: Recent Labs  Lab 08/10/21 0011 08/10/21 0359 08/11/21 0422 08/12/21 1111 08/13/21 0549 08/14/21 0436  NA 131* 130* 135 131* 136 136  K 2.6* 2.8* 2.9* 3.2* 3.9 4.2  CL 94* 98 108 106 109 107  CO2 23 21* 20* 20* 22 21*  GLUCOSE 134* 86 104* 132* 104* 113*  BUN 11 8 <5* <5* <5* <5*  CREATININE 0.48 0.46 0.41* 0.40* 0.35* 0.39*  CALCIUM 8.9 7.6* 7.8* 7.7* 8.3* 8.5*  MG 1.7 1.6* 2.0  --  1.6* 1.8   GFR: Estimated Creatinine Clearance: 79.4 mL/min (A) (by C-G formula based on SCr of 0.39 mg/dL (L)). Liver Function Tests: Recent Labs  Lab 08/10/21 0011 08/10/21 0359  AST 42* 32  ALT 25 21  ALKPHOS 124 97  BILITOT 1.1 1.0  PROT 7.3 6.0*  ALBUMIN 2.9* 2.3*   Recent Labs  Lab 08/10/21 0011  LIPASE 25   No results for input(s): AMMONIA in the last 168 hours. Coagulation Profile: No results for input(s): INR, PROTIME in the last 168 hours. Cardiac Enzymes: No results for input(s): CKTOTAL, CKMB, CKMBINDEX, TROPONINI in the last 168 hours. BNP (last 3 results) No results for input(s): PROBNP in the last 8760 hours. HbA1C: No results for input(s): HGBA1C in the last 72 hours. CBG: No results for input(s): GLUCAP in the last 168 hours. Lipid Profile: No results for input(s): CHOL, HDL, LDLCALC, TRIG, CHOLHDL, LDLDIRECT in the last 72 hours. Thyroid Function Tests: No results for input(s): TSH, T4TOTAL, FREET4, T3FREE, THYROIDAB in the last 72 hours. Anemia Panel: No results for input(s): VITAMINB12, FOLATE, FERRITIN, TIBC, IRON, RETICCTPCT in the last 72 hours. Sepsis Labs: Recent Labs  Lab 08/10/21 0359 08/10/21 1418 08/11/21 0152 08/11/21 0422  PROCALCITON 3.01  --   --   --   LATICACIDVEN 0.7 4.1* 1.4 1.1    Recent Results (from the past 240 hour(s))  Resp Panel by RT-PCR (Flu A&B, Covid)  Nasopharyngeal Swab     Status: None   Collection Time: 08/09/21 12:11 AM   Specimen: Nasopharyngeal Swab; Nasopharyngeal(NP) swabs in vial transport medium  Result Value Ref Range Status   SARS Coronavirus 2 by RT PCR NEGATIVE NEGATIVE Final    Comment: (NOTE) SARS-CoV-2 target nucleic acids are NOT DETECTED.  The SARS-CoV-2 RNA is generally detectable in upper respiratory specimens during the acute phase of infection. The lowest concentration of SARS-CoV-2 viral copies this assay can detect is 138 copies/mL. A negative result does not preclude SARS-Cov-2 infection and should not be used as the sole basis for treatment or other patient management decisions. A negative result may occur with  improper specimen collection/handling, submission of specimen other than nasopharyngeal swab, presence of viral mutation(s) within the areas targeted by this assay,  and inadequate number of viral copies(<138 copies/mL). A negative result must be combined with clinical observations, patient history, and epidemiological information. The expected result is Negative.  Fact Sheet for Patients:  EntrepreneurPulse.com.au  Fact Sheet for Healthcare Providers:  IncredibleEmployment.be  This test is no t yet approved or cleared by the [place] FDA and  has been authorized for detection and/or diagnosis of SARS-CoV-2 by FDA under an Emergency Use Authorization (EUA). This EUA will remain  in effect (meaning this test can be used) for the duration of the COVID-19 declaration under Section 564(b)(1) of the Act, 21 U.S.C.section 360bbb-3(b)(1), unless the authorization is terminated  or revoked sooner.       Influenza A by PCR NEGATIVE NEGATIVE Final   Influenza B by PCR NEGATIVE NEGATIVE Final    Comment: (NOTE) The Xpert Xpress SARS-CoV-2/FLU/RSV plus assay is intended as an aid in the diagnosis of influenza from Nasopharyngeal swab specimens and should not be  used as a sole basis for treatment. Nasal washings and aspirates are unacceptable for Xpert Xpress SARS-CoV-2/FLU/RSV testing.  Fact Sheet for Patients: EntrepreneurPulse.com.au  Fact Sheet for Healthcare Providers: IncredibleEmployment.be  This test is not yet approved or cleared by the [place] FDA and has been authorized for detection and/or diagnosis of SARS-CoV-2 by FDA under an Emergency Use Authorization (EUA). This EUA will remain in effect (meaning this test can be used) for the duration of the COVID-19 declaration under Section 564(b)(1) of the Act, 21 U.S.C. section 360bbb-3(b)(1), unless the authorization is terminated or revoked.  Performed at [facility], [address] [place], [place] [address]   MRSA Next Gen by PCR, Nasal     Status: None   Collection Time: 08/10/21  6:46 AM   Specimen: Nasal Mucosa; Nasal Swab  Result Value Ref Range Status   MRSA by PCR Next Gen NOT DETECTED NOT DETECTED Final    Comment: (NOTE) The GeneXpert MRSA Assay (FDA approved for NASAL specimens only), is one component of a comprehensive MRSA colonization surveillance program. It is not intended to diagnose MRSA infection nor to guide or monitor treatment for MRSA infections. Test performance is not FDA approved in patients less than 70 years old. Performed at [facility], [address] [place], [place] [address]   C Difficile Quick Screen w PCR reflex     Status: None   Collection Time: 08/10/21  8:22 AM   Specimen: STOOL  Result Value Ref Range Status   C Diff antigen NEGATIVE NEGATIVE Final   C Diff toxin NEGATIVE NEGATIVE Final   C Diff interpretation No C. difficile detected.  Final    Comment: Performed at [facility], [address], [place],  [address]  Gastrointestinal Panel by PCR , Stool     Status: None   Collection Time: 08/10/21  8:22 AM   Specimen: STOOL  Result Value Ref Range Status   Campylobacter species NOT  DETECTED NOT DETECTED Final   Plesimonas shigelloides NOT DETECTED NOT DETECTED Final   Salmonella species NOT DETECTED NOT DETECTED Final   Yersinia enterocolitica NOT DETECTED NOT DETECTED Final   Vibrio species NOT DETECTED NOT DETECTED Final   Vibrio cholerae NOT DETECTED NOT DETECTED Final   Enteroaggregative E coli (EAEC) NOT DETECTED NOT DETECTED Final   Enteropathogenic E coli (EPEC) NOT DETECTED NOT DETECTED Final   Enterotoxigenic E coli (ETEC) NOT DETECTED NOT DETECTED Final   Shiga like toxin producing E coli (STEC) NOT DETECTED NOT DETECTED Final   Shigella/Enteroinvasive  E coli (EIEC) NOT DETECTED NOT DETECTED Final   Cryptosporidium NOT DETECTED NOT DETECTED Final   Cyclospora cayetanensis NOT DETECTED NOT DETECTED Final   Entamoeba histolytica NOT DETECTED NOT DETECTED Final   Giardia lamblia NOT DETECTED NOT DETECTED Final   Adenovirus F40/41 NOT DETECTED NOT DETECTED Final   Astrovirus NOT DETECTED NOT DETECTED Final   Norovirus GI/GII NOT DETECTED NOT DETECTED Final   Rotavirus A NOT DETECTED NOT DETECTED Final   Sapovirus (I, II, IV, and V) NOT DETECTED NOT DETECTED Final    Comment: Performed at [facility], [address] [place], [place] [address]  Culture, blood (routine x 2)     Status: None (Preliminary result)   Collection Time: 08/10/21  2:32 PM   Specimen: Left Antecubital; Blood  Result Value Ref Range Status   Specimen Description LEFT ANTECUBITAL  Final   Special Requests   Final    BOTTLES DRAWN AEROBIC AND ANAEROBIC Blood Culture adequate volume   Culture   Final    NO GROWTH 4 DAYS Performed at [facility], [address] [place], [place] [address]    Report Status PENDING  Incomplete  Culture, blood (routine x 2)     Status: None (Preliminary result)   Collection Time: 08/10/21  2:32 PM   Specimen: BLOOD LEFT HAND  Result Value Ref Range Status   Specimen Description BLOOD LEFT HAND  Final   Special Requests   Final    BOTTLES  DRAWN AEROBIC AND ANAEROBIC Blood Culture results may not be optimal due to an excessive volume of blood received in culture bottles   Culture   Final    NO GROWTH 4 DAYS Performed at [facility], [address] [place], [place] [address]    Report Status PENDING  Incomplete         Radiology Studies: CT Angio Chest Pulmonary Embolism (PE) W or WO Contrast  Result Date: 08/12/2021 CLINICAL DATA:  Tachycardia with elevated D-dimer levels. History of hypertension. Clinical concern for pulmonary embolism. EXAM: CT ANGIOGRAPHY CHEST WITH CONTRAST TECHNIQUE: Multidetector CT imaging of the chest was performed using the standard protocol during bolus administration of intravenous contrast. Multiplanar CT image reconstructions and MIPs were obtained to evaluate the vascular anatomy. RADIATION DOSE REDUCTION: This exam was performed according to the departmental dose-optimization program which includes automated exposure control, adjustment of the mA and/or kV according to patient size and/or use of iterative reconstruction technique. CONTRAST:  25m OMNIPAQUE IOHEXOL 350 MG/ML SOLN COMPARISON:  Chest CT 01/13/2020. FINDINGS: Cardiovascular: The pulmonary arteries are suboptimally opacified with contrast due to preferential opacification of the aorta and mild breathing artifact. There is no evidence of acute pulmonary embolism. No acute systemic arterial abnormalities are identified. The heart size is stable. There is no pericardial effusion. Mediastinum/Nodes: There are no enlarged mediastinal, hilar or axillary lymph nodes. The thyroid gland, trachea and esophagus demonstrate no significant findings. Lungs/Pleura: The previously demonstrated bilateral pleural effusions have resolved, and there is improved aeration of both lungs. There is mild residual dependent atelectasis in both lungs. No airspace disease or suspicious pulmonary nodularity identified. Upper abdomen: The liver demonstrates diffusely  decreased density consistent with steatosis. No focal abnormality identified. No acute findings are seen within the visualized upper abdomen. Musculoskeletal/Chest wall: There is no chest wall mass or suspicious osseous finding. Generalized soft tissue edema present previously has improved. Review of the MIP images confirms the above findings. IMPRESSION: 1. No evidence of acute pulmonary embolism or other  acute findings within the chest. 2. Interval resolution of previously demonstrated pleural effusions with improved atelectasis in both lungs. 3. Hepatic steatosis. Electronically Signed   By: Richardean Sale M.D.   On: 08/12/2021 14:23        Scheduled Meds:  Chlorhexidine Gluconate Cloth  6 each Topical Daily   folic acid  1 mg Oral Daily   heparin  5,000 Units Subcutaneous Q8H   multivitamin with minerals  1 tablet Oral Daily   pantoprazole (PROTONIX) IV  40 mg Intravenous Q12H   potassium chloride  40 mEq Oral BID   temazepam  7.5 mg Oral QHS   thiamine  100 mg Oral Daily   Or   thiamine  100 mg Intravenous Daily   Continuous Infusions:  sodium chloride Stopped (08/14/21 0730)   ciprofloxacin 400 mg (08/14/21 0903)   metronidazole 100 mL/hr at 08/14/21 0842     LOS: 3 days    Time spent: 35 minutes    Orley Lawry D Manuella Ghazi, DO [facility]  If 7PM-7AM, please contact night-coverage [email or web address] 08/14/2021, 12:00 PM

## 2021-08-15 LAB — CULTURE, BLOOD (ROUTINE X 2)
Culture: NO GROWTH
Culture: NO GROWTH
Special Requests: ADEQUATE

## 2021-08-15 LAB — ECHOCARDIOGRAM LIMITED
Height: 58 in
S' Lateral: 3.6 cm
Single Plane A4C EF: 44.2 %
Weight: 2998.26 oz

## 2021-08-15 LAB — BASIC METABOLIC PANEL
Anion gap: 9 (ref 5–15)
BUN: <5 mg/dL — ABNORMAL LOW (ref 6–20)
CO2: 21 mmol/L — ABNORMAL LOW (ref 22–32)
Calcium: 8.4 mg/dL — ABNORMAL LOW (ref 8.9–10.3)
Chloride: 107 mmol/L (ref 98–111)
Creatinine, Ser: 0.39 mg/dL — ABNORMAL LOW (ref 0.44–1.00)
GFR, Estimated: >60 mL/min (ref >60)
Glucose, Bld: 99 mg/dL (ref 70–99)
Potassium: 4.1 mmol/L (ref 3.5–5.1)
Sodium: 137 mmol/L (ref 135–145)

## 2021-08-15 LAB — CBC
HCT: 34.6 % — ABNORMAL LOW (ref 36.0–46.0)
Hemoglobin: 10.8 g/dL — ABNORMAL LOW (ref 12.0–15.0)
MCH: 29.8 pg (ref 26.0–34.0)
MCHC: 31.2 g/dL (ref 30.0–36.0)
MCV: 95.6 fL (ref 80.0–100.0)
Platelets: 504 10*3/uL — ABNORMAL HIGH (ref 150–400)
RBC: 3.62 MIL/uL — ABNORMAL LOW (ref 3.87–5.11)
RDW: 16.3 % — ABNORMAL HIGH (ref 11.5–15.5)
WBC: 12.3 10*3/uL — ABNORMAL HIGH (ref 4.0–10.5)
nRBC: 0 % (ref 0.0–0.2)

## 2021-08-15 LAB — MAGNESIUM: Magnesium: 1.9 mg/dL (ref 1.7–2.4)

## 2021-08-15 MED ORDER — HYDROMORPHONE HCL 1 MG/ML IJ SOLN
0.5000 mg | INTRAMUSCULAR | Status: DC | PRN
  Administered 2021-08-15 – 2021-08-16 (×3): 0.5 mg via INTRAVENOUS
  Filled 2021-08-15 (×4): qty 0.5

## 2021-08-15 MED ORDER — METOPROLOL TARTRATE 25 MG PO TABS
12.5000 mg | ORAL_TABLET | Freq: Two times a day (BID) | ORAL | Status: DC
  Administered 2021-08-15 (×2): 12.5 mg via ORAL
  Filled 2021-08-15 (×2): qty 1

## 2021-08-15 NOTE — Progress Notes (Signed)
Nutrition Follow-up  DOCUMENTATION CODES:   Non-severe (moderate) malnutrition in context of acute illness/injury, Obesity unspecified  INTERVENTION:  - Boost Plus po BID, each supplement provides 360 kcal and 14 grams of protein - Add 2PM and 8PM snack to encourage adequate PO intake - Continue MVI with minerals daily  NUTRITION DIAGNOSIS:   Moderate Malnutrition related to acute illness (dehydration, necrotizing faciitis, diverting colostomy) as evidenced by energy intake < or equal to 50% for > or equal to 5 days, mild muscle depletion.  GOAL:   Patient will meet greater than or equal to 90% of their needs  Addressing needs via meals and nutrition supplements  MONITOR:   PO intake, Supplement acceptance, Labs, Weight trends, I & O's  REASON FOR ASSESSMENT:   Malnutrition Screening Tool    ASSESSMENT:   Patient is a 51 yo hx of necrotizing fascitis s/p diverting colostomy. Presents with electrolyte disturbance, dehydration.  Documented meal completions of 0-15% within the past 3 days. Pt reports poor appetite and an episode of vomiting this morning after eating breakfast. She reports ongoing nausea which is being addressed with zofran. Spoke with pt about ways to get adequate nutrition such as eating something after taking zofran, addition of snacks throughout the day, and adding Boost Plus supplements.   Weight 1/26: 176 kg Current weight: 187 lbs  Pt meets criteria for acute malnutrition, however suspect underlying chronic malnutrition.  Medications: folvite, MVI, protonix, KCl, thiamine, IV abx  Labs: BUN <5, Cr 0.39  Colostomy output: 12m x 24 hours  NUTRITION - FOCUSED PHYSICAL EXAM:  Flowsheet Row Most Recent Value  Orbital Region No depletion  Upper Arm Region Mild depletion  Thoracic and Lumbar Region No depletion  Buccal Region No depletion  Temple Region Moderate depletion  Clavicle Bone Region No depletion  Clavicle and Acromion Bone Region No  depletion  Scapular Bone Region Mild depletion  Dorsal Hand Mild depletion  Patellar Region Mild depletion  Anterior Thigh Region Mild depletion  Posterior Calf Region Moderate depletion  Edema (RD Assessment) None  Hair Reviewed  Eyes Reviewed  Mouth Reviewed  Skin Reviewed  Nails Reviewed       Diet Order:   Diet Order             DIET SOFT Room service appropriate? Yes; Fluid consistency: Thin  Diet effective now                   EDUCATION NEEDS:   Education needs have been addressed  Skin:  Skin Assessment: Reviewed RN Assessment  Last BM:  1/29 via colostomy  Height:   Ht Readings from Last 1 Encounters:  08/10/21 4' 10"  (1.473 m)    Weight:   Wt Readings from Last 1 Encounters:  08/14/21 85 kg   BMI:  Body mass index is 39.16 kg/m.  Estimated Nutritional Needs:   Kcal:  1500-1700  Protein:  90-99 gr  Fluid:  >1700 ml daily  AClayborne Dana RDN, LDN Clinical Nutrition

## 2021-08-15 NOTE — Progress Notes (Signed)
PROGRESS NOTE    SHACARRA CHOE  DSK:[id number] DOB: 08/29/1970 DOA: 08/09/2021 PCP: Caryl Bis, MD   Brief Narrative:   Alyssa Carey  is a 51 y.o. 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  Patient was admitted with pancolitis and associated dehydration as well as electrolyte abnormalities with hypokalemia and hypomagnesemia in the setting of ongoing high output/diarrhea.  She has been started on ciprofloxacin and Flagyl empirically on 1/25 and GI pathogen panel as well as C. difficile testing has been ordered and testing has remained negative.  She is quite fluid positive and therefore further IV fluid will be discontinued.  She continues to have sinus tachycardia for which cardiology now recommends initiation of low-dose metoprolol.  Assessment & Plan:   Principal Problem:   Dehydration Active Problems:   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CC)   Hypokalemia   Hypoalbuminemia due to protein-calorie malnutrition (HCC)   Pancolitis -Started on ciprofloxacin and Flagyl empirically (day 6/10) -GI pathogen panel negative and C. difficile testing negative -No recent use of antibiotics -Stool output decreasing, maintain on soft diet   Dehydration secondary to above -With noted high ostomy output, now decreasing -Continue IV fluid at decreased rate   Sinus tachycardia -Likely related to the dehydration as well as persistent electrolyte abnormalities -No sign of anxiety noted -Check TSH 0.6 -D-dimer noted to be 5.71, will CT angiogram negative for PE -2D echocardiogram with LVEF 50-55% and no significant abnormalities -Discussed case with cardiology 1/30 with plans to start low-dose metoprolol and follow   Moderate protein calorie malnutrition -Continue current diet which she appears to be tolerating   FOBT positive -Continue to monitor repeat CBC -No frank bleeding noted,  continue to monitor -IV PPI for now due to PUD   History of PUD/alcoholic gastritis -IV PPI   Alcoholic liver disease -CIWA protocol     DVT prophylaxis: Heparin Code Status: Full Family Communication: None at bedside Disposition Plan:  Status is: Inpatient   Remains inpatient appropriate because: Continuous need for IV fluids and medications.       Consultants:  General surgery Discussed case with cardiology 1/30   Procedures:  See below  Antimicrobials:  Anti-infectives (From admission, onward)    Start     Dose/Rate Route Frequency Ordered Stop   08/10/21 1000  ciprofloxacin (CIPRO) IVPB 400 mg        400 mg 200 mL/hr over 60 Minutes Intravenous Every 12 hours 08/10/21 0824     08/10/21 0915  metroNIDAZOLE (FLAGYL) IVPB 500 mg        500 mg 100 mL/hr over 60 Minutes Intravenous Every 8 hours 08/10/21 0824         Subjective: Patient seen and evaluated today with intermittent abdominal pain, but no significant ostomy output.  She continues to have some ongoing tachycardia and states that she does get somewhat short of breath with exertion, but is otherwise asymptomatic from this.  Objective: Vitals:   08/15/21 0812 08/15/21 0900 08/15/21 1015 08/15/21 1131  BP:  (!) 141/97 93/69   Pulse:  (!) 125 (!) 127   Resp:  (!) 32 (!) 35   Temp: 97.9 F (36.6 C)   97.8 F (36.6 C)  TempSrc: Oral   Oral  SpO2:  100% 96%   Weight:      Height:        Intake/Output Summary (Last 24 hours) at 08/15/2021  [place] filed at 08/15/2021 1208 Gross per 24 hour  Intake 1197.74 ml  Output 100 ml  Net 1097.74 ml   Filed Weights   08/12/21 0500 08/13/21 0619 08/14/21 0500  Weight: 82.7 kg 83.7 kg 85 kg    Examination:  General exam: Appears calm and comfortable  Respiratory system: Clear to auscultation. Respiratory effort normal. Cardiovascular system: S1 & S2 heard, RRR.  Tachycardic Gastrointestinal system: Abdomen is soft, parastomal hernia soft and ostomy  with fluid and gas output.  Stump is pink. Central nervous system: Alert and awake Extremities: No edema Skin: No significant lesions noted Psychiatry: Flat affect.    Data Reviewed: I have personally reviewed following labs and imaging studies  CBC: Recent Labs  Lab 08/10/21 0011 08/10/21 0359 08/10/21 1418 08/11/21 0422 08/14/21 0436 08/15/21 0245  WBC 10.8* 10.4 8.5 8.4 11.7* 12.3*  NEUTROABS 9.0* 7.9*  --   --   --   --   HGB 12.6 10.4* 9.5* 9.6* 10.9* 10.8*  HCT 36.9 31.3* 29.3* 29.8* 34.0* 34.6*  MCV 90.7 93.7 95.8 94.3 94.7 95.6  PLT 316 295 253 275 412* 845*   Basic Metabolic Panel: Recent Labs  Lab 08/10/21 0359 08/11/21 0422 08/12/21 1111 08/13/21 0549 08/14/21 0436 08/15/21 0245  NA 130* 135 131* 136 136 137  K 2.8* 2.9* 3.2* 3.9 4.2 4.1  CL 98 108 106 109 107 107  CO2 21* 20* 20* 22 21* 21*  GLUCOSE 86 104* 132* 104* 113* 99  BUN 8 <5* <5* <5* <5* <5*  CREATININE 0.46 0.41* 0.40* 0.35* 0.39* 0.39*  CALCIUM 7.6* 7.8* 7.7* 8.3* 8.5* 8.4*  MG 1.6* 2.0  --  1.6* 1.8 1.9   GFR: Estimated Creatinine Clearance: 79.4 mL/min (A) (by C-G formula based on SCr of 0.39 mg/dL (L)). Liver Function Tests: Recent Labs  Lab 08/10/21 0011 08/10/21 0359  AST 42* 32  ALT 25 21  ALKPHOS 124 97  BILITOT 1.1 1.0  PROT 7.3 6.0*  ALBUMIN 2.9* 2.3*   Recent Labs  Lab 08/10/21 0011  LIPASE 25   No results for input(s): AMMONIA in the last 168 hours. Coagulation Profile: No results for input(s): INR, PROTIME in the last 168 hours. Cardiac Enzymes: No results for input(s): CKTOTAL, CKMB, CKMBINDEX, TROPONINI in the last 168 hours. BNP (last 3 results) No results for input(s): PROBNP in the last 8760 hours. HbA1C: No results for input(s): HGBA1C in the last 72 hours. CBG: No results for input(s): GLUCAP in the last 168 hours. Lipid Profile: No results for input(s): CHOL, HDL, LDLCALC, TRIG, CHOLHDL, LDLDIRECT in the last 72 hours. Thyroid Function Tests: No  results for input(s): TSH, T4TOTAL, FREET4, T3FREE, THYROIDAB in the last 72 hours. Anemia Panel: No results for input(s): VITAMINB12, FOLATE, FERRITIN, TIBC, IRON, RETICCTPCT in the last 72 hours. Sepsis Labs: Recent Labs  Lab 08/10/21 0359 08/10/21 1418 08/11/21 0152 08/11/21 0422  PROCALCITON 3.01  --   --   --   LATICACIDVEN 0.7 4.1* 1.4 1.1    Recent Results (from the past 240 hour(s))  Resp Panel by RT-PCR (Flu A&B, Covid) Nasopharyngeal Swab     Status: None   Collection Time: 08/09/21 12:11 AM   Specimen: Nasopharyngeal Swab; Nasopharyngeal(NP) swabs in vial transport medium  Result Value Ref Range Status   SARS Coronavirus 2 by RT PCR NEGATIVE NEGATIVE Final    Comment: (NOTE) SARS-CoV-2 target nucleic acids are NOT DETECTED.  The SARS-CoV-2 RNA is generally detectable in upper respiratory  specimens during the acute phase of infection. The lowest concentration of SARS-CoV-2 viral copies this assay can detect is 138 copies/mL. A negative result does not preclude SARS-Cov-2 infection and should not be used as the sole basis for treatment or other patient management decisions. A negative result may occur with  improper specimen collection/handling, submission of specimen other than nasopharyngeal swab, presence of viral mutation(s) within the areas targeted by this assay, and inadequate number of viral copies(<138 copies/mL). A negative result must be combined with clinical observations, patient history, and epidemiological information. The expected result is Negative.  Fact Sheet for Patients:  EntrepreneurPulse.com.au  Fact Sheet for Healthcare Providers:  IncredibleEmployment.be  This test is no t yet approved or cleared by the [place] FDA and  has been authorized for detection and/or diagnosis of SARS-CoV-2 by FDA under an Emergency Use Authorization (EUA). This EUA will remain  in effect (meaning this test can be used)  for the duration of the COVID-19 declaration under Section 564(b)(1) of the Act, 21 U.S.C.section 360bbb-3(b)(1), unless the authorization is terminated  or revoked sooner.       Influenza A by PCR NEGATIVE NEGATIVE Final   Influenza B by PCR NEGATIVE NEGATIVE Final    Comment: (NOTE) The Xpert Xpress SARS-CoV-2/FLU/RSV plus assay is intended as an aid in the diagnosis of influenza from Nasopharyngeal swab specimens and should not be used as a sole basis for treatment. Nasal washings and aspirates are unacceptable for Xpert Xpress SARS-CoV-2/FLU/RSV testing.  Fact Sheet for Patients: EntrepreneurPulse.com.au  Fact Sheet for Healthcare Providers: IncredibleEmployment.be  This test is not yet approved or cleared by the [place] FDA and has been authorized for detection and/or diagnosis of SARS-CoV-2 by FDA under an Emergency Use Authorization (EUA). This EUA will remain in effect (meaning this test can be used) for the duration of the COVID-19 declaration under Section 564(b)(1) of the Act, 21 U.S.C. section 360bbb-3(b)(1), unless the authorization is terminated or revoked.  Performed at [facility], [address] [place], [place] [address]   MRSA Next Gen by PCR, Nasal     Status: None   Collection Time: 08/10/21  6:46 AM   Specimen: Nasal Mucosa; Nasal Swab  Result Value Ref Range Status   MRSA by PCR Next Gen NOT DETECTED NOT DETECTED Final    Comment: (NOTE) The GeneXpert MRSA Assay (FDA approved for NASAL specimens only), is one component of a comprehensive MRSA colonization surveillance program. It is not intended to diagnose MRSA infection nor to guide or monitor treatment for MRSA infections. Test performance is not FDA approved in patients less than 30 years old. Performed at [facility], [address] [place], [place] [address]   C Difficile Quick Screen w PCR reflex     Status: None   Collection Time: 08/10/21  8:22 AM    Specimen: STOOL  Result Value Ref Range Status   C Diff antigen NEGATIVE NEGATIVE Final   C Diff toxin NEGATIVE NEGATIVE Final   C Diff interpretation No C. difficile detected.  Final    Comment: Performed at [facility], [address] [place], [place] [address]  Gastrointestinal Panel by PCR , Stool     Status: None   Collection Time: 08/10/21  8:22 AM   Specimen: STOOL  Result Value Ref Range Status   Campylobacter species NOT DETECTED NOT DETECTED Final   Plesimonas shigelloides NOT DETECTED NOT DETECTED Final   Salmonella species NOT DETECTED NOT DETECTED Final   Yersinia enterocolitica  NOT DETECTED NOT DETECTED Final   Vibrio species NOT DETECTED NOT DETECTED Final   Vibrio cholerae NOT DETECTED NOT DETECTED Final   Enteroaggregative E coli (EAEC) NOT DETECTED NOT DETECTED Final   Enteropathogenic E coli (EPEC) NOT DETECTED NOT DETECTED Final   Enterotoxigenic E coli (ETEC) NOT DETECTED NOT DETECTED Final   Shiga like toxin producing E coli (STEC) NOT DETECTED NOT DETECTED Final   Shigella/Enteroinvasive E coli (EIEC) NOT DETECTED NOT DETECTED Final   Cryptosporidium NOT DETECTED NOT DETECTED Final   Cyclospora cayetanensis NOT DETECTED NOT DETECTED Final   Entamoeba histolytica NOT DETECTED NOT DETECTED Final   Giardia lamblia NOT DETECTED NOT DETECTED Final   Adenovirus F40/41 NOT DETECTED NOT DETECTED Final   Astrovirus NOT DETECTED NOT DETECTED Final   Norovirus GI/GII NOT DETECTED NOT DETECTED Final   Rotavirus A NOT DETECTED NOT DETECTED Final   Sapovirus (I, II, IV, and V) NOT DETECTED NOT DETECTED Final    Comment: Performed at [facility], [address] [place], [place] [address]  Culture, blood (routine x 2)     Status: None (Preliminary result)   Collection Time: 08/10/21  2:32 PM   Specimen: Left Antecubital; Blood  Result Value Ref Range Status   Specimen Description LEFT ANTECUBITAL  Final   Special Requests   Final    BOTTLES DRAWN AEROBIC  AND ANAEROBIC Blood Culture adequate volume   Culture   Final    NO GROWTH 4 DAYS Performed at [facility], [address] [place], [place] [address]    Report Status PENDING  Incomplete  Culture, blood (routine x 2)     Status: None (Preliminary result)   Collection Time: 08/10/21  2:32 PM   Specimen: BLOOD LEFT HAND  Result Value Ref Range Status   Specimen Description BLOOD LEFT HAND  Final   Special Requests   Final    BOTTLES DRAWN AEROBIC AND ANAEROBIC Blood Culture results may not be optimal due to an excessive volume of blood received in culture bottles   Culture   Final    NO GROWTH 4 DAYS Performed at [facility], [address] [place], [place] [address]    Report Status PENDING  Incomplete         Radiology Studies: ECHOCARDIOGRAM LIMITED  Result Date: 08/15/2021    ECHOCARDIOGRAM LIMITED REPORT   Patient Name:   Dylan MARVETTA VOHS Date of Exam: 08/14/2021 Medical Rec #:  [id number]       Height:       58.0 in Accession #:    [id number]      Weight:       187.4 lb Date of Birth:  Nov 15, 1970       BSA:          1.771 m Patient Age:    22 years        BP:           132/91 mmHg Patient Gender: F               HR:           124 bpm. Exam Location:  Inpatient Procedure: Limited Echo, Limited Color Doppler and Cardiac Doppler Indications:     abnormal ecg  History:         Patient has prior history of Echocardiogram examinations, most                  recent 04/22/2021. Risk Factors:Hypertension.  Sonographer:  Johny Chess RDCS Referring Phys:  [id number] [place] Diagnosing Phys: Gwyndolyn Kaufman MD IMPRESSIONS  1. Left ventricular ejection fraction, by estimation, is 50 to 55%. The left ventricle has low normal function. The left ventricle has no regional wall motion abnormalities.  2. The right ventricular size is normal. There is normal pulmonary artery systolic pressure. The estimated right ventricular systolic pressure is 46.8 mmHg.  3. The mitral valve is grossly  normal. Trivial mitral valve regurgitation.  4. The aortic valve was not well visualized. Aortic valve regurgitation is not visualized. No aortic stenosis is present.  5. The inferior vena cava is normal in size with greater than 50% respiratory variability, suggesting right atrial pressure of 3 mmHg.  6. Tachycardiac throughout study. Comparison(s): Compared to prior TTE in 04/2021, the EF appears slightly lower at ~55% (previously 60-65%) but patient is notably more tachycardic during current study. FINDINGS  Left Ventricle: Left ventricular ejection fraction, by estimation, is 50 to 55%. The left ventricle has low normal function. The left ventricle has no regional wall motion abnormalities. The left ventricular internal cavity size was normal in size. There is no left ventricular hypertrophy. Right Ventricle: The right ventricular size is normal. There is normal pulmonary artery systolic pressure. The tricuspid regurgitant velocity is 2.59 m/s, and with an assumed right atrial pressure of 3 mmHg, the estimated right ventricular systolic pressure is 03.2 mmHg. Left Atrium: Left atrial size was normal in size. Right Atrium: Right atrial size was normal in size. Pericardium: There is no evidence of pericardial effusion. Mitral Valve: The mitral valve is grossly normal. There is mild thickening of the mitral valve leaflet(s). There is mild calcification of the mitral valve leaflet(s). Trivial mitral valve regurgitation. Tricuspid Valve: The tricuspid valve is normal in structure. Tricuspid valve regurgitation is mild. Aortic Valve: The aortic valve was not well visualized. Aortic valve regurgitation is not visualized. No aortic stenosis is present. Pulmonic Valve: The pulmonic valve was normal in structure. Pulmonic valve regurgitation is trivial. Venous: The inferior vena cava is normal in size with greater than 50% respiratory variability, suggesting right atrial pressure of 3 mmHg. IAS/Shunts: The atrial septum  is grossly normal. LEFT VENTRICLE PLAX 2D LVIDd:         5.10 cm LVIDs:         3.60 cm  LV Volumes (MOD) LV vol d, MOD A4C: 81.9 ml LV vol s, MOD A4C: 45.7 ml LV SV MOD A4C:     81.9 ml AORTIC VALVE LVOT Vmax:   93.30 cm/s LVOT Vmean:  58.000 cm/s LVOT VTI:    0.110 m TRICUSPID VALVE TR Peak grad:   26.8 mmHg TR Vmax:        259.00 cm/s  SHUNTS Systemic VTI: 0.11 m Gwyndolyn Kaufman MD Electronically signed by Gwyndolyn Kaufman MD Signature Date/Time: 08/15/2021/10:07:59 AM    Final (Updated)         Scheduled Meds:  Chlorhexidine Gluconate Cloth  6 each Topical Daily   folic acid  1 mg Oral Daily   heparin  5,000 Units Subcutaneous Q8H   metoprolol tartrate  12.5 mg Oral BID   multivitamin with minerals  1 tablet Oral Daily   pantoprazole (PROTONIX) IV  40 mg Intravenous Q12H   potassium chloride  40 mEq Oral BID   temazepam  7.5 mg Oral QHS   thiamine  100 mg Oral Daily   Or   thiamine  100 mg Intravenous Daily   Continuous Infusions:  ciprofloxacin  400 mg (08/15/21 1159)   metronidazole 500 mg (08/15/21 0857)     LOS: 4 days    Time spent: 35 minutes    Marcelis Wissner D Manuella Ghazi, DO [facility]  If 7PM-7AM, please contact night-coverage [email or web address] 08/15/2021, 12:12 PM

## 2021-08-16 ENCOUNTER — Inpatient Hospital Stay (HOSPITAL_COMMUNITY): Payer: Self-pay

## 2021-08-16 LAB — CBC
HCT: 34.3 % — ABNORMAL LOW (ref 36.0–46.0)
Hemoglobin: 11.1 g/dL — ABNORMAL LOW (ref 12.0–15.0)
MCH: 31.4 pg (ref 26.0–34.0)
MCHC: 32.4 g/dL (ref 30.0–36.0)
MCV: 97.2 fL (ref 80.0–100.0)
Platelets: 559 10*3/uL — ABNORMAL HIGH (ref 150–400)
RBC: 3.53 MIL/uL — ABNORMAL LOW (ref 3.87–5.11)
RDW: 16.4 % — ABNORMAL HIGH (ref 11.5–15.5)
WBC: 16.7 10*3/uL — ABNORMAL HIGH (ref 4.0–10.5)
nRBC: 0 % (ref 0.0–0.2)

## 2021-08-16 MED ORDER — IOHEXOL 300 MG/ML  SOLN
100.0000 mL | Freq: Once | INTRAMUSCULAR | Status: AC | PRN
  Administered 2021-08-16: 100 mL via INTRAVENOUS

## 2021-08-16 MED ORDER — LACTATED RINGERS IV SOLN: INTRAVENOUS | Status: AC

## 2021-08-16 MED ORDER — PHENOL 1.4 % MT LIQD
1.0000 | OROMUCOSAL | Status: DC | PRN
  Administered 2021-08-16: 1 via OROMUCOSAL
  Filled 2021-08-16 (×2): qty 177

## 2021-08-16 MED ORDER — METOPROLOL TARTRATE 25 MG PO TABS
25.0000 mg | ORAL_TABLET | Freq: Two times a day (BID) | ORAL | Status: DC
  Administered 2021-08-16: 25 mg via ORAL
  Filled 2021-08-16: qty 1

## 2021-08-16 MED ORDER — LEVALBUTEROL HCL 0.63 MG/3ML IN NEBU
0.6300 mg | INHALATION_SOLUTION | Freq: Four times a day (QID) | RESPIRATORY_TRACT | Status: DC | PRN
  Administered 2021-08-17: 0.63 mg via RESPIRATORY_TRACT
  Filled 2021-08-16: qty 3

## 2021-08-16 MED ORDER — KETOROLAC TROMETHAMINE 30 MG/ML IJ SOLN
30.0000 mg | Freq: Four times a day (QID) | INTRAMUSCULAR | Status: DC | PRN
  Administered 2021-08-16 (×2): 30 mg via INTRAVENOUS
  Filled 2021-08-16 (×3): qty 1

## 2021-08-16 MED ORDER — METOPROLOL TARTRATE 5 MG/5ML IV SOLN
2.5000 mg | Freq: Four times a day (QID) | INTRAVENOUS | Status: DC
  Administered 2021-08-16 – 2021-08-23 (×21): 2.5 mg via INTRAVENOUS
  Administered 2021-08-23 (×2): 5 mg via INTRAVENOUS
  Administered 2021-08-23 – 2021-08-27 (×16): 2.5 mg via INTRAVENOUS
  Filled 2021-08-16 (×38): qty 5

## 2021-08-16 NOTE — Progress Notes (Signed)
PROGRESS NOTE    Alyssa Carey  YWV:[id number] DOB: 01/01/1971 DOA: 08/09/2021 PCP: Caryl Bis, MD   Brief Narrative:   Alyssa Carey  is a 51 y.o. 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  Patient was admitted with pancolitis and associated dehydration as well as electrolyte abnormalities with hypokalemia and hypomagnesemia in the setting of ongoing high output/diarrhea.  She has been started on ciprofloxacin and Flagyl empirically on 1/25 and GI pathogen panel as well as C. difficile testing has been ordered which have now returned negative.  General surgery had initially evaluated patient on account of the parastomal hernia, but patient was improving and therefore they had signed off.  She now is having some persistent nausea and vomiting along with abdominal pain and repeat CT imaging on 1/31 demonstrates high-grade bowel obstruction findings for which general surgery has been reconsulted and will evaluate on 2/1.  Patient has been made NPO once again.  Assessment & Plan:   Principal Problem:   Dehydration Active Problems:   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CC)   Hypokalemia   Hypoalbuminemia due to protein-calorie malnutrition (HCC)   Pancolitis-resolving -Started on ciprofloxacin and Flagyl empirically (day 7/10) -GI pathogen panel negative and C. difficile testing negative -No recent use of antibiotics -Stool output decreasing, maintain on soft diet  New findings of high-grade small bowel obstruction in the setting of parastomal hernia -General surgery reconsult appreciated.  Discussed with Dr. Constance Haw 1/31 who will evaluate on 2/1.  If surgical intervention is required, will likely require transfer. -Transition back to n.p.o. and resume IV fluid   Dehydration secondary to above -With noted high ostomy output, now decreasing -Continue IV fluid at decreased rate   Sinus  tachycardia-ongoing -Likely related to the dehydration as well as persistent electrolyte abnormalities -No sign of anxiety noted -Check TSH 0.6 -D-dimer noted to be 5.71, will CT angiogram negative for PE -Check urine tox and echo 1/29 -Started on metoprolol 1/30 after discussion with cardiology.  Increased dose to 25 mg twice daily 1/31, but will use IV metoprolol for now given the SBO   Moderate protein calorie malnutrition -Currently n.p.o.   FOBT positive -Continue to monitor repeat CBC -No frank bleeding noted, continue to monitor -IV PPI for now due to PUD   History of PUD/alcoholic gastritis -IV PPI   Alcoholic liver disease -CIWA protocol     DVT prophylaxis: Heparin Code Status: Full Family Communication: None at bedside Disposition Plan:  Status is: Inpatient   Remains inpatient appropriate because: Continuous need for IV fluids and medications.       Consultants:  General surgery   Procedures:  See below  Antimicrobials:  Anti-infectives (From admission, onward)    Start     Dose/Rate Route Frequency Ordered Stop   08/10/21 1000  ciprofloxacin (CIPRO) IVPB 400 mg        400 mg 200 mL/hr over 60 Minutes Intravenous Every 12 hours 08/10/21 0824     08/10/21 0915  metroNIDAZOLE (FLAGYL) IVPB 500 mg        500 mg 100 mL/hr over 60 Minutes Intravenous Every 8 hours 08/10/21 0824        Subjective: Patient seen and evaluated today with ongoing, intermittent abdominal pain that appears to be quite severe.  She continues to have some nausea and vomiting with poor oral intake noted as well.  Ostomy output has decreased.  She  remains tachycardic and becomes quite short of breath with ambulation.  Objective: Vitals:   08/16/21 1033 08/16/21 1100 08/16/21 1113 08/16/21 1226  BP:    100/77  Pulse: (!) 107 (!) 118 (!) 104 (!) 111  Resp: (!) 35 (!) 27 (!) 37 (!) 36  Temp:   99 F (37.2 C)   TempSrc:   Oral   SpO2: 98% 94% 98% 98%  Weight:      Height:         Intake/Output Summary (Last 24 hours) at 08/16/2021 1318 Last data filed at 08/16/2021 0300 Gross per 24 hour  Intake 1200 ml  Output 500 ml  Net 700 ml   Filed Weights   08/13/21 0619 08/14/21 0500 08/16/21 0401  Weight: 83.7 kg 85 kg 86.1 kg    Examination:  General exam: Appears to be in minimal-moderate distress Respiratory system: Clear to auscultation. Respiratory effort normal.  On nasal cannula Cardiovascular system: S1 & S2 heard, RRR.  Tachycardic Gastrointestinal system: Abdomen is soft, large parastomal hernia present with ostomy present and small liquid output Central nervous system: Alert and awake Extremities: No edema Skin: No significant lesions noted Psychiatry: Flat affect.    Data Reviewed: I have personally reviewed following labs and imaging studies  CBC: Recent Labs  Lab 08/10/21 0011 08/10/21 0359 08/10/21 1418 08/11/21 0422 08/14/21 0436 08/15/21 0245 08/16/21 0351  WBC 10.8* 10.4 8.5 8.4 11.7* 12.3* 16.7*  NEUTROABS 9.0* 7.9*  --   --   --   --   --   HGB 12.6 10.4* 9.5* 9.6* 10.9* 10.8* 11.1*  HCT 36.9 31.3* 29.3* 29.8* 34.0* 34.6* 34.3*  MCV 90.7 93.7 95.8 94.3 94.7 95.6 97.2  PLT 316 295 253 275 412* 504* 951*   Basic Metabolic Panel: Recent Labs  Lab 08/10/21 0359 08/11/21 0422 08/12/21 1111 08/13/21 0549 08/14/21 0436 08/15/21 0245  NA 130* 135 131* 136 136 137  K 2.8* 2.9* 3.2* 3.9 4.2 4.1  CL 98 108 106 109 107 107  CO2 21* 20* 20* 22 21* 21*  GLUCOSE 86 104* 132* 104* 113* 99  BUN 8 <5* <5* <5* <5* <5*  CREATININE 0.46 0.41* 0.40* 0.35* 0.39* 0.39*  CALCIUM 7.6* 7.8* 7.7* 8.3* 8.5* 8.4*  MG 1.6* 2.0  --  1.6* 1.8 1.9   GFR: Estimated Creatinine Clearance: 80.1 mL/min (A) (by C-G formula based on SCr of 0.39 mg/dL (L)). Liver Function Tests: Recent Labs  Lab 08/10/21 0011 08/10/21 0359  AST 42* 32  ALT 25 21  ALKPHOS 124 97  BILITOT 1.1 1.0  PROT 7.3 6.0*  ALBUMIN 2.9* 2.3*   Recent Labs  Lab  08/10/21 0011  LIPASE 25   No results for input(s): AMMONIA in the last 168 hours. Coagulation Profile: No results for input(s): INR, PROTIME in the last 168 hours. Cardiac Enzymes: No results for input(s): CKTOTAL, CKMB, CKMBINDEX, TROPONINI in the last 168 hours. BNP (last 3 results) No results for input(s): PROBNP in the last 8760 hours. HbA1C: No results for input(s): HGBA1C in the last 72 hours. CBG: No results for input(s): GLUCAP in the last 168 hours. Lipid Profile: No results for input(s): CHOL, HDL, LDLCALC, TRIG, CHOLHDL, LDLDIRECT in the last 72 hours. Thyroid Function Tests: No results for input(s): TSH, T4TOTAL, FREET4, T3FREE, THYROIDAB in the last 72 hours. Anemia Panel: No results for input(s): VITAMINB12, FOLATE, FERRITIN, TIBC, IRON, RETICCTPCT in the last 72 hours. Sepsis Labs: Recent Labs  Lab 08/10/21 0359 08/10/21 1418 08/11/21  4010 08/11/21 0422  PROCALCITON 3.01  --   --   --   LATICACIDVEN 0.7 4.1* 1.4 1.1    Recent Results (from the past 240 hour(s))  Resp Panel by RT-PCR (Flu A&B, Covid) Nasopharyngeal Swab     Status: None   Collection Time: 08/09/21 12:11 AM   Specimen: Nasopharyngeal Swab; Nasopharyngeal(NP) swabs in vial transport medium  Result Value Ref Range Status   SARS Coronavirus 2 by RT PCR NEGATIVE NEGATIVE Final    Comment: (NOTE) SARS-CoV-2 target nucleic acids are NOT DETECTED.  The SARS-CoV-2 RNA is generally detectable in upper respiratory specimens during the acute phase of infection. The lowest concentration of SARS-CoV-2 viral copies this assay can detect is 138 copies/mL. A negative result does not preclude SARS-Cov-2 infection and should not be used as the sole basis for treatment or other patient management decisions. A negative result may occur with  improper specimen collection/handling, submission of specimen other than nasopharyngeal swab, presence of viral mutation(s) within the areas targeted by this assay, and  inadequate number of viral copies(<138 copies/mL). A negative result must be combined with clinical observations, patient history, and epidemiological information. The expected result is Negative.  Fact Sheet for Patients:  EntrepreneurPulse.com.au  Fact Sheet for Healthcare Providers:  IncredibleEmployment.be  This test is no t yet approved or cleared by the [place] FDA and  has been authorized for detection and/or diagnosis of SARS-CoV-2 by FDA under an Emergency Use Authorization (EUA). This EUA will remain  in effect (meaning this test can be used) for the duration of the COVID-19 declaration under Section 564(b)(1) of the Act, 21 U.S.C.section 360bbb-3(b)(1), unless the authorization is terminated  or revoked sooner.       Influenza A by PCR NEGATIVE NEGATIVE Final   Influenza B by PCR NEGATIVE NEGATIVE Final    Comment: (NOTE) The Xpert Xpress SARS-CoV-2/FLU/RSV plus assay is intended as an aid in the diagnosis of influenza from Nasopharyngeal swab specimens and should not be used as a sole basis for treatment. Nasal washings and aspirates are unacceptable for Xpert Xpress SARS-CoV-2/FLU/RSV testing.  Fact Sheet for Patients: EntrepreneurPulse.com.au  Fact Sheet for Healthcare Providers: IncredibleEmployment.be  This test is not yet approved or cleared by the [place] FDA and has been authorized for detection and/or diagnosis of SARS-CoV-2 by FDA under an Emergency Use Authorization (EUA). This EUA will remain in effect (meaning this test can be used) for the duration of the COVID-19 declaration under Section 564(b)(1) of the Act, 21 U.S.C. section 360bbb-3(b)(1), unless the authorization is terminated or revoked.  Performed at [facility], [address] [place], [place] [address]   MRSA Next Gen by PCR, Nasal     Status: None   Collection Time: 08/10/21  6:46 AM   Specimen: Nasal  Mucosa; Nasal Swab  Result Value Ref Range Status   MRSA by PCR Next Gen NOT DETECTED NOT DETECTED Final    Comment: (NOTE) The GeneXpert MRSA Assay (FDA approved for NASAL specimens only), is one component of a comprehensive MRSA colonization surveillance program. It is not intended to diagnose MRSA infection nor to guide or monitor treatment for MRSA infections. Test performance is not FDA approved in patients less than 61 years old. Performed at [facility], [address] [place], [place] [address]   C Difficile Quick Screen w PCR reflex     Status: None   Collection Time: 08/10/21  8:22 AM   Specimen: STOOL  Result Value Ref Range Status  C Diff antigen NEGATIVE NEGATIVE Final   C Diff toxin NEGATIVE NEGATIVE Final   C Diff interpretation No C. difficile detected.  Final    Comment: Performed at [facility], [address], [place], [place] [address]  Gastrointestinal Panel by PCR , Stool     Status: None   Collection Time: 08/10/21  8:22 AM   Specimen: STOOL  Result Value Ref Range Status   Campylobacter species NOT DETECTED NOT DETECTED Final   Plesimonas shigelloides NOT DETECTED NOT DETECTED Final   Salmonella species NOT DETECTED NOT DETECTED Final   Yersinia enterocolitica NOT DETECTED NOT DETECTED Final   Vibrio species NOT DETECTED NOT DETECTED Final   Vibrio cholerae NOT DETECTED NOT DETECTED Final   Enteroaggregative E coli (EAEC) NOT DETECTED NOT DETECTED Final   Enteropathogenic E coli (EPEC) NOT DETECTED NOT DETECTED Final   Enterotoxigenic E coli (ETEC) NOT DETECTED NOT DETECTED Final   Shiga like toxin producing E coli (STEC) NOT DETECTED NOT DETECTED Final   Shigella/Enteroinvasive E coli (EIEC) NOT DETECTED NOT DETECTED Final   Cryptosporidium NOT DETECTED NOT DETECTED Final   Cyclospora cayetanensis NOT DETECTED NOT DETECTED Final   Entamoeba histolytica NOT DETECTED NOT DETECTED Final   Giardia lamblia NOT DETECTED NOT DETECTED Final   Adenovirus F40/41  NOT DETECTED NOT DETECTED Final   Astrovirus NOT DETECTED NOT DETECTED Final   Norovirus GI/GII NOT DETECTED NOT DETECTED Final   Rotavirus A NOT DETECTED NOT DETECTED Final   Sapovirus (I, II, IV, and V) NOT DETECTED NOT DETECTED Final    Comment: Performed at [facility], [address] [place], [place] [address]  Culture, blood (routine x 2)     Status: None   Collection Time: 08/10/21  2:32 PM   Specimen: Left Antecubital; Blood  Result Value Ref Range Status   Specimen Description LEFT ANTECUBITAL  Final   Special Requests   Final    BOTTLES DRAWN AEROBIC AND ANAEROBIC Blood Culture adequate volume   Culture   Final    NO GROWTH 5 DAYS Performed at [facility], [address], [place], [place] [address]    Report Status 08/15/2021 FINAL  Final  Culture, blood (routine x 2)     Status: None   Collection Time: 08/10/21  2:32 PM   Specimen: BLOOD LEFT HAND  Result Value Ref Range Status   Specimen Description BLOOD LEFT HAND  Final   Special Requests   Final    BOTTLES DRAWN AEROBIC AND ANAEROBIC Blood Culture results may not be optimal due to an excessive volume of blood received in culture bottles   Culture   Final    NO GROWTH 5 DAYS Performed at [facility], [address], [place], [place] [address]    Report Status 08/15/2021 FINAL  Final         Radiology Studies: CT ABDOMEN PELVIS W CONTRAST  Result Date: 08/16/2021 CLINICAL DATA:  Abdominal pain EXAM: CT ABDOMEN AND PELVIS WITH CONTRAST TECHNIQUE: Multidetector CT imaging of the abdomen and pelvis was performed using the standard protocol following bolus administration of intravenous contrast. RADIATION DOSE REDUCTION: This exam was performed according to the departmental dose-optimization program which includes automated exposure control, adjustment of the mA and/or kV according to patient size and/or use of iterative reconstruction technique. CONTRAST:  137m OMNIPAQUE IOHEXOL 300 MG/ML  SOLN  COMPARISON:  08/10/2021 FINDINGS: Lower chest: There are small patchy infiltrates in both lower lobes with interval worsening. Hepatobiliary: There is fatty  infiltration in the liver. Gallbladder is unremarkable. Pancreas: There is pancreatic atrophy. No focal abnormality is seen. Spleen: Unremarkable. Adrenals/Urinary Tract: Adrenals are not enlarged. There is no hydronephrosis. There are no renal or ureteral stones. Urinary bladder is not distended. Stomach/Bowel: There is marked distention of stomach. There is fluid in the lumen of visualized lower thoracic esophagus, possibly suggesting reflux. There is abnormal dilation of small-bowel loops. Small bowel loops measure up to 4.8 cm. There is a ventral hernia containing portions of small bowel loops and colon in the left lower quadrant. There is transition in the diameter of small-bowel loops within the hernia. There is mild diffuse wall thickening in the visualized portions of colon. There is liquid stool in the colon. There is decompression of sigmoid colon and rectum. Vascular/Lymphatic: Unremarkable. Reproductive: Other: There is no ascites or pneumoperitoneum. There is edema in subcutaneous plane in the abdominal wall, more so on the left side without loculated fluid collections. Musculoskeletal: Unremarkable. IMPRESSION: There is interval marked distention of stomach with fluid. There is abnormal dilation of proximal small bowel loops measuring up to 4.8 cm in diameter suggesting high-grade partial obstruction. Zone of transition in the small bowel loops appears to be within a ventral hernia in the left lower quadrant. Partly decompressed distal small bowel loops are seen within the hernial sac in the left lower quadrant. There is wall thickening along with fluid in the lumen in the ascending, transverse and descending colon suggesting colitis. Wall thickening in the colon appears less prominent. There is decompression of sigmoid colon. Findings suggest  colitis and partial obstruction of left colon within the hernial sac in the left lower quadrant. There is no hydronephrosis. There are patchy infiltrates in both lower lung fields suggesting atelectasis/pneumonia. There is fluid in the lumen of thoracic esophagus suggesting gastroesophageal reflux. Fatty liver. There is edema in subcutaneous plane, possibly suggesting anasarca. There are no loculated fluid collections in the abdominal wall. Other findings as described in the body of the report. Electronically Signed   By: Elmer Picker M.D.   On: 08/16/2021 12:45   ECHOCARDIOGRAM LIMITED  Result Date: 08/15/2021    ECHOCARDIOGRAM LIMITED REPORT   Patient Name:   Alyssa Carey Date of Exam: 08/14/2021 Medical Rec #:  [id number]       Height:       58.0 in Accession #:    [id number]      Weight:       187.4 lb Date of Birth:  1970-08-09       BSA:          1.771 m Patient Age:    30 years        BP:           132/91 mmHg Patient Gender: F               HR:           124 bpm. Exam Location:  Inpatient Procedure: Limited Echo, Limited Color Doppler and Cardiac Doppler Indications:     abnormal ecg  History:         Patient has prior history of Echocardiogram examinations, most                  recent 04/22/2021. Risk Factors:Hypertension.  Sonographer:     Johny Chess RDCS Referring Phys:  [id number] [place] Diagnosing Phys: Gwyndolyn Kaufman MD IMPRESSIONS  1. Left ventricular ejection fraction, by estimation, is 50 to 55%. The left ventricle  has low normal function. The left ventricle has no regional wall motion abnormalities.  2. The right ventricular size is normal. There is normal pulmonary artery systolic pressure. The estimated right ventricular systolic pressure is 40.9 mmHg.  3. The mitral valve is grossly normal. Trivial mitral valve regurgitation.  4. The aortic valve was not well visualized. Aortic valve regurgitation is not visualized. No aortic stenosis is present.  5. The inferior vena  cava is normal in size with greater than 50% respiratory variability, suggesting right atrial pressure of 3 mmHg.  6. Tachycardiac throughout study. Comparison(s): Compared to prior TTE in 04/2021, the EF appears slightly lower at ~55% (previously 60-65%) but patient is notably more tachycardic during current study. FINDINGS  Left Ventricle: Left ventricular ejection fraction, by estimation, is 50 to 55%. The left ventricle has low normal function. The left ventricle has no regional wall motion abnormalities. The left ventricular internal cavity size was normal in size. There is no left ventricular hypertrophy. Right Ventricle: The right ventricular size is normal. There is normal pulmonary artery systolic pressure. The tricuspid regurgitant velocity is 2.59 m/s, and with an assumed right atrial pressure of 3 mmHg, the estimated right ventricular systolic pressure is 81.1 mmHg. Left Atrium: Left atrial size was normal in size. Right Atrium: Right atrial size was normal in size. Pericardium: There is no evidence of pericardial effusion. Mitral Valve: The mitral valve is grossly normal. There is mild thickening of the mitral valve leaflet(s). There is mild calcification of the mitral valve leaflet(s). Trivial mitral valve regurgitation. Tricuspid Valve: The tricuspid valve is normal in structure. Tricuspid valve regurgitation is mild. Aortic Valve: The aortic valve was not well visualized. Aortic valve regurgitation is not visualized. No aortic stenosis is present. Pulmonic Valve: The pulmonic valve was normal in structure. Pulmonic valve regurgitation is trivial. Venous: The inferior vena cava is normal in size with greater than 50% respiratory variability, suggesting right atrial pressure of 3 mmHg. IAS/Shunts: The atrial septum is grossly normal. LEFT VENTRICLE PLAX 2D LVIDd:         5.10 cm LVIDs:         3.60 cm  LV Volumes (MOD) LV vol d, MOD A4C: 81.9 ml LV vol s, MOD A4C: 45.7 ml LV SV MOD A4C:     81.9 ml  AORTIC VALVE LVOT Vmax:   93.30 cm/s LVOT Vmean:  58.000 cm/s LVOT VTI:    0.110 m TRICUSPID VALVE TR Peak grad:   26.8 mmHg TR Vmax:        259.00 cm/s  SHUNTS Systemic VTI: 0.11 m Gwyndolyn Kaufman MD Electronically signed by Gwyndolyn Kaufman MD Signature Date/Time: 08/15/2021/10:07:59 AM    Final (Updated)         Scheduled Meds:  Chlorhexidine Gluconate Cloth  6 each Topical Daily   folic acid  1 mg Oral Daily   heparin  5,000 Units Subcutaneous Q8H   metoprolol tartrate  25 mg Oral BID   multivitamin with minerals  1 tablet Oral Daily   pantoprazole (PROTONIX) IV  40 mg Intravenous Q12H   potassium chloride  40 mEq Oral BID   temazepam  7.5 mg Oral QHS   thiamine  100 mg Oral Daily   Or   thiamine  100 mg Intravenous Daily   Continuous Infusions:  ciprofloxacin 400 mg (08/16/21 0917)   lactated ringers     metronidazole 500 mg (08/16/21 1106)     LOS: 5 days    Time spent: 35 minutes  Treylan Mcclintock Darleen Crocker, DO [facility]  If 7PM-7AM, please contact night-coverage [email or web address] 08/16/2021, 1:18 PM

## 2021-08-17 ENCOUNTER — Inpatient Hospital Stay (HOSPITAL_COMMUNITY): Payer: Self-pay

## 2021-08-17 ENCOUNTER — Inpatient Hospital Stay: Payer: Self-pay

## 2021-08-17 DIAGNOSIS — I471 Supraventricular tachycardia: Secondary | ICD-10-CM

## 2021-08-17 DIAGNOSIS — K433 Parastomal hernia with obstruction, without gangrene: Secondary | ICD-10-CM

## 2021-08-17 DIAGNOSIS — E440 Moderate protein-calorie malnutrition: Secondary | ICD-10-CM | POA: Insufficient documentation

## 2021-08-17 LAB — CBC
HCT: 38.5 % (ref 36.0–46.0)
Hemoglobin: 12.1 g/dL (ref 12.0–15.0)
MCH: 30.9 pg (ref 26.0–34.0)
MCHC: 31.4 g/dL (ref 30.0–36.0)
MCV: 98.5 fL (ref 80.0–100.0)
Platelets: 652 10*3/uL — ABNORMAL HIGH (ref 150–400)
RBC: 3.91 MIL/uL (ref 3.87–5.11)
RDW: 17.1 % — ABNORMAL HIGH (ref 11.5–15.5)
WBC: 19.2 10*3/uL — ABNORMAL HIGH (ref 4.0–10.5)
nRBC: 0 % (ref 0.0–0.2)

## 2021-08-17 LAB — COMPREHENSIVE METABOLIC PANEL
ALT: 13 U/L (ref 0–44)
AST: 25 U/L (ref 15–41)
Albumin: 2.7 g/dL — ABNORMAL LOW (ref 3.5–5.0)
Alkaline Phosphatase: 112 U/L (ref 38–126)
Anion gap: 13 (ref 5–15)
BUN: 17 mg/dL (ref 6–20)
CO2: 19 mmol/L — ABNORMAL LOW (ref 22–32)
Calcium: 8.6 mg/dL — ABNORMAL LOW (ref 8.9–10.3)
Chloride: 102 mmol/L (ref 98–111)
Creatinine, Ser: 1.36 mg/dL — ABNORMAL HIGH (ref 0.44–1.00)
GFR, Estimated: 48 mL/min — ABNORMAL LOW (ref >60)
Glucose, Bld: 107 mg/dL — ABNORMAL HIGH (ref 70–99)
Potassium: 3.4 mmol/L — ABNORMAL LOW (ref 3.5–5.1)
Sodium: 134 mmol/L — ABNORMAL LOW (ref 135–145)
Total Bilirubin: 0.9 mg/dL (ref 0.3–1.2)
Total Protein: 7.2 g/dL (ref 6.5–8.1)

## 2021-08-17 LAB — MAGNESIUM: Magnesium: 3.1 mg/dL — ABNORMAL HIGH (ref 1.7–2.4)

## 2021-08-17 MED ORDER — POTASSIUM CHLORIDE 10 MEQ/100ML IV SOLN
INTRAVENOUS | Status: AC
  Administered 2021-08-17: 10 meq via INTRAVENOUS
  Filled 2021-08-17: qty 100

## 2021-08-17 MED ORDER — SODIUM CHLORIDE 0.9 % IV BOLUS
250.0000 mL | Freq: Once | INTRAVENOUS | Status: AC
Start: 2021-08-17 — End: 2021-08-17
  Administered 2021-08-17: 250 mL via INTRAVENOUS

## 2021-08-17 MED ORDER — POTASSIUM CHLORIDE 10 MEQ/100ML IV SOLN
10.0000 meq | INTRAVENOUS | Status: AC
  Administered 2021-08-17: 10 meq via INTRAVENOUS

## 2021-08-17 MED ORDER — SODIUM CHLORIDE 0.9% FLUSH
10.0000 mL | Freq: Two times a day (BID) | INTRAVENOUS | Status: DC
  Administered 2021-08-17 – 2021-08-20 (×5): 10 mL
  Administered 2021-08-21 – 2021-08-22 (×2): 20 mL
  Administered 2021-08-23 – 2021-08-28 (×12): 10 mL

## 2021-08-17 MED ORDER — SODIUM CHLORIDE 0.9% FLUSH: 10.0000 mL | INTRAVENOUS | Status: DC | PRN

## 2021-08-17 MED ORDER — POTASSIUM CHLORIDE 10 MEQ/100ML IV SOLN
INTRAVENOUS | Status: AC
  Filled 2021-08-17: qty 100

## 2021-08-17 MED ORDER — KETOROLAC TROMETHAMINE 10 MG PO TABS
10.0000 mg | ORAL_TABLET | Freq: Four times a day (QID) | ORAL | Status: DC | PRN
  Administered 2021-08-17: 10 mg via ORAL
  Filled 2021-08-17 (×3): qty 1

## 2021-08-17 MED ORDER — METHOCARBAMOL 1000 MG/10ML IJ SOLN
INTRAMUSCULAR | Status: AC
  Filled 2021-08-17: qty 10

## 2021-08-17 MED ORDER — METHOCARBAMOL 1000 MG/10ML IJ SOLN
500.0000 mg | Freq: Three times a day (TID) | INTRAVENOUS | Status: DC | PRN
  Administered 2021-08-17 – 2021-08-18 (×2): 500 mg via INTRAVENOUS
  Filled 2021-08-17: qty 5

## 2021-08-17 NOTE — Progress Notes (Signed)
Vascular team notified of need of PICC line. Waiting for return call

## 2021-08-17 NOTE — Progress Notes (Signed)
[facility]  Cxr ng is in the stomach.   Curlene Labrum, MD [facility] [address] [place], [place] [address] [phone] (office)

## 2021-08-17 NOTE — Progress Notes (Signed)
Peripherally Inserted Central Catheter Placement  The IV Nurse has discussed with the patient and/or persons authorized to consent for the patient, the purpose of this procedure and the potential benefits and risks involved with this procedure.  The benefits include less needle sticks, lab draws from the catheter, and the patient may be discharged home with the catheter. Risks include, but not limited to, infection, bleeding, blood clot (thrombus formation), and puncture of an artery; nerve damage and irregular heartbeat and possibility to perform a PICC exchange if needed/ordered by physician.  Alternatives to this procedure were also discussed.  Bard Power PICC patient education guide, fact sheet on infection prevention and patient information card has been provided to patient /or left at bedside.    PICC Placement Documentation  PICC Double Lumen 08/17/21 PICC Right Brachial 37 cm 1 cm (Active)  Indication for Insertion or Continuance of Line Limited venous access - need for IV therapy >5 days (PICC only) 08/17/21 1537  Exposed Catheter (cm) 0 cm 08/17/21 1537  Site Assessment Clean;Dry;Intact 08/17/21 1537  Lumen #1 Status Flushed;Blood return noted;Saline locked 08/17/21 1537  Lumen #2 Status Flushed;Blood return noted;Saline locked 08/17/21 1537  Dressing Type Transparent 08/17/21 1537  Dressing Status Clean;Dry;Intact 08/17/21 1537  Antimicrobial disc in place? Yes 08/17/21 1537  Dressing Change Due 08/24/21 08/17/21 1537       Scotty Court 08/17/2021, 3:39 PM

## 2021-08-17 NOTE — Progress Notes (Signed)
PROGRESS NOTE    [name]  BZJ:[id number] DOB: 04-Aug-1970 DOA: 08/09/2021 PCP: Caryl Bis, MD   Brief Narrative:   Alyssa Carey  is a 51 y.o. 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  Patient was admitted with pancolitis and associated dehydration as well as electrolyte abnormalities with hypokalemia and hypomagnesemia in the setting of ongoing high output/diarrhea.  She has been started on ciprofloxacin and Flagyl empirically on 1/25 and GI pathogen panel as well as C. difficile testing has been ordered which have now returned negative.  General surgery had initially evaluated patient on account of the parastomal hernia, but patient was improving and therefore they had signed off.  She now is having some persistent nausea and vomiting along with abdominal pain and repeat CT imaging on 1/31 demonstrates high-grade bowel obstruction findings for which general surgery has been reconsulted and has recommended patient to be n.p.o. and to place NG tube.    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 difficile testing negative -No recent use of antibiotics -Continue supportive care.  New findings of high-grade small bowel obstruction in the setting of parastomal hernia -Genera, recommended n.p.o. status, NG tube and follow clinical response.  Patient facing the need of surgical intervention. -We will provide TPN for nutrition purposes.  Dehydration secondary to above -With noted high ostomy output at time of admission, now decreasing -Continue IV  fluid  -Continue to follow clinical response and provide support.   Sinus tachycardia-ongoing -Likely related to the dehydration as well as persistent electrolyte abnormalities -No sign of anxiety noted -TSH 0.6 -D-dimer noted to be 5.71, will CT angiogram negative for PE -Check urine tox and echo 1/29 -Started on metoprolol 1/30 after discussion with cardiology continue adjusted dose of metoprolol 25 mg twice a day currently IV as patient unable to tolerate p.o.   Moderate protein calorie malnutrition -Currently n.p.o. -NG tube will be placed follow recommendation by general surgery -Patient will get central line and will initiate TPN management.   FOBT positive -Continue to monitor hemoglobin trend; no overt bleeding appreciated. -Continue treatment for PPI as mentioned below.   History of PUD/alcoholic gastritis -Continue IV PPI   Alcoholic liver disease -Continue CIWA protocol -No active withdrawal symptoms appreciated. -Cessation counseling provided.     DVT prophylaxis: Heparin Code Status: Full Family Communication: None at bedside Disposition Plan:  Status is: Inpatient   Remains inpatient appropriate because: Continuous need for IV fluids and medications.       Consultants:  General surgery   Procedures:  See below  Antimicrobials:  Anti-infectives (From admission, onward)    Start     Dose/Rate Route Frequency Ordered Stop   08/10/21 1000  ciprofloxacin (CIPRO) IVPB 400 mg        400 mg 200 mL/hr over 60 Minutes Intravenous Every 12 hours 08/10/21 0824     08/10/21 0915  metroNIDAZOLE (FLAGYL) IVPB 500 mg        500 mg 100 mL/hr over 60 Minutes Intravenous Every 8 hours 08/10/21 0824        Subjective: Afebrile, reporting some  improvement in her overall abdominal discomfort.  Complaining of nausea.  Remains tachycardic and short winded with minimal activity.  Objective: Vitals:   08/17/21 1400 08/17/21 1500 08/17/21 1600 08/17/21 1700  BP: (!)  91/59 107/84  (!) 95/47  Pulse: (!) 113 (!) 114 (!) 117 (!) 108  Resp: (!) 29 (!) 33 (!) 31 (!) 32  Temp:   98.2 F (36.8 C)   TempSrc:   Oral   SpO2: 98% 97% 97% 99%  Weight:      Height:        Intake/Output Summary (Last 24 hours) at 08/17/2021 1820 Last data filed at 08/17/2021 1200 Gross per 24 hour  Intake 235.78 ml  Output 2700 ml  Net -2464.22 ml   Filed Weights   08/14/21 0500 08/16/21 0401 08/17/21 0545  Weight: 85 kg 86.1 kg 85.6 kg    Examination: General exam: Alert, awake, oriented x 3, reporting feeling nauseous; no fever.  Diffuse rhonchi on auscultation.  No requiring oxygen supplementation. Respiratory system: Positive rhonchi bilaterally; no wheezing.  No using accessory muscles.  Good saturation on room air. Cardiovascular system: Sinus tachycardia. No murmurs, rubs, gallops.  No JVD.  Trace edema appreciated in her lower extremities bilaterally. Gastrointestinal system: Abdomen is soft, with the presence of large parastomal hernia, reducible; decreased but present bowel sounds.  Positive small liquid material in patient's ostomy bag.   Central nervous system: Alert and oriented. No focal neurological deficits. Extremities: No cyanosis or clubbing. Skin: No petechiae. Psychiatry: Judgement and insight appear normal.  Flat affect.    Data Reviewed: I have personally reviewed following labs and imaging studies  CBC: Recent Labs  Lab 08/11/21 0422 08/14/21 0436 08/15/21 0245 08/16/21 0351 08/17/21 0700  WBC 8.4 11.7* 12.3* 16.7* 19.2*  HGB 9.6* 10.9* 10.8* 11.1* 12.1  HCT 29.8* 34.0* 34.6* 34.3* 38.5  MCV 94.3 94.7 95.6 97.2 98.5  PLT 275 412* 504* 559* 366*   Basic Metabolic Panel: Recent Labs  Lab 08/11/21 0422 08/12/21 1111 08/13/21 0549 08/14/21 0436 08/15/21 0245 08/17/21 0700  NA 135 131* 136 136 137 134*  K 2.9* 3.2* 3.9 4.2 4.1 3.4*  CL 108 106 109 107 107 102  CO2 20* 20* 22 21* 21* 19*  GLUCOSE 104* 132* 104* 113* 99 107*  BUN  <5* <5* <5* <5* <5* 17  CREATININE 0.41* 0.40* 0.35* 0.39* 0.39* 1.36*  CALCIUM 7.8* 7.7* 8.3* 8.5* 8.4* 8.6*  MG 2.0  --  1.6* 1.8 1.9 3.1*   GFR: Estimated Creatinine Clearance: 47 mL/min (A) (by C-G formula based on SCr of 1.36 mg/dL (H)).  Liver Function Tests: Recent Labs  Lab 08/17/21 0700  AST 25  ALT 13  ALKPHOS 112  BILITOT 0.9  PROT 7.2  ALBUMIN 2.7*   Sepsis Labs: Recent Labs  Lab 08/11/21 0152 08/11/21 0422  LATICACIDVEN 1.4 1.1    Recent Results (from the past 240 hour(s))  Resp Panel by RT-PCR (Flu A&B, Covid) Nasopharyngeal Swab     Status: None   Collection Time: 08/09/21 12:11 AM   Specimen: Nasopharyngeal Swab; Nasopharyngeal(NP) swabs in vial transport medium  Result Value Ref Range Status   SARS Coronavirus 2 by RT PCR NEGATIVE NEGATIVE Final    Comment: (NOTE) SARS-CoV-2 target nucleic acids are NOT DETECTED.  The SARS-CoV-2 RNA is generally detectable in upper respiratory specimens during the acute phase of infection. The lowest concentration of SARS-CoV-2 viral copies this assay can detect is 138 copies/mL. A negative result does not  preclude SARS-Cov-2 infection and should not be used as the sole basis for treatment or other patient management decisions. A negative result may occur with  improper specimen collection/handling, submission of specimen other than nasopharyngeal swab, presence of viral mutation(s) within the areas targeted by this assay, and inadequate number of viral copies(<138 copies/mL). A negative result must be combined with clinical observations, patient history, and epidemiological information. The expected result is Negative.  Fact Sheet for Patients:  EntrepreneurPulse.com.au  Fact Sheet for Healthcare Providers:  IncredibleEmployment.be  This test is no t yet approved or cleared by the [place] FDA and  has been authorized for detection and/or diagnosis of SARS-CoV-2 by FDA  under an Emergency Use Authorization (EUA). This EUA will remain  in effect (meaning this test can be used) for the duration of the COVID-19 declaration under Section 564(b)(1) of the Act, 21 U.S.C.section 360bbb-3(b)(1), unless the authorization is terminated  or revoked sooner.       Influenza A by PCR NEGATIVE NEGATIVE Final   Influenza B by PCR NEGATIVE NEGATIVE Final    Comment: (NOTE) The Xpert Xpress SARS-CoV-2/FLU/RSV plus assay is intended as an aid in the diagnosis of influenza from Nasopharyngeal swab specimens and should not be used as a sole basis for treatment. Nasal washings and aspirates are unacceptable for Xpert Xpress SARS-CoV-2/FLU/RSV testing.  Fact Sheet for Patients: EntrepreneurPulse.com.au  Fact Sheet for Healthcare Providers: IncredibleEmployment.be  This test is not yet approved or cleared by the [place] FDA and has been authorized for detection and/or diagnosis of SARS-CoV-2 by FDA under an Emergency Use Authorization (EUA). This EUA will remain in effect (meaning this test can be used) for the duration of the COVID-19 declaration under Section 564(b)(1) of the Act, 21 U.S.C. section 360bbb-3(b)(1), unless the authorization is terminated or revoked.  Performed at [facility], [address] [place], [place] [address]   MRSA Next Gen by PCR, Nasal     Status: None   Collection Time: 08/10/21  6:46 AM   Specimen: Nasal Mucosa; Nasal Swab  Result Value Ref Range Status   MRSA by PCR Next Gen NOT DETECTED NOT DETECTED Final    Comment: (NOTE) The GeneXpert MRSA Assay (FDA approved for NASAL specimens only), is one component of a comprehensive MRSA colonization surveillance program. It is not intended to diagnose MRSA infection nor to guide or monitor treatment for MRSA infections. Test performance is not FDA approved in patients less than 49 years old. Performed at [facility], [address]  [place], [place] [address]   C Difficile Quick Screen w PCR reflex     Status: None   Collection Time: 08/10/21  8:22 AM   Specimen: STOOL  Result Value Ref Range Status   C Diff antigen NEGATIVE NEGATIVE Final   C Diff toxin NEGATIVE NEGATIVE Final   C Diff interpretation No C. difficile detected.  Final    Comment: Performed at [facility], [address], [place], [place] [address]  Gastrointestinal Panel by PCR , Stool     Status: None   Collection Time: 08/10/21  8:22 AM   Specimen: STOOL  Result Value Ref Range Status   Campylobacter species NOT DETECTED NOT DETECTED Final   Plesimonas shigelloides NOT DETECTED NOT DETECTED Final   Salmonella species NOT DETECTED NOT DETECTED Final   Yersinia enterocolitica NOT DETECTED NOT DETECTED Final   Vibrio species NOT DETECTED NOT DETECTED Final   Vibrio cholerae NOT DETECTED NOT DETECTED Final   Enteroaggregative  E coli (EAEC) NOT DETECTED NOT DETECTED Final   Enteropathogenic E coli (EPEC) NOT DETECTED NOT DETECTED Final   Enterotoxigenic E coli (ETEC) NOT DETECTED NOT DETECTED Final   Shiga like toxin producing E coli (STEC) NOT DETECTED NOT DETECTED Final   Shigella/Enteroinvasive E coli (EIEC) NOT DETECTED NOT DETECTED Final   Cryptosporidium NOT DETECTED NOT DETECTED Final   Cyclospora cayetanensis NOT DETECTED NOT DETECTED Final   Entamoeba histolytica NOT DETECTED NOT DETECTED Final   Giardia lamblia NOT DETECTED NOT DETECTED Final   Adenovirus F40/41 NOT DETECTED NOT DETECTED Final   Astrovirus NOT DETECTED NOT DETECTED Final   Norovirus GI/GII NOT DETECTED NOT DETECTED Final   Rotavirus A NOT DETECTED NOT DETECTED Final   Sapovirus (I, II, IV, and V) NOT DETECTED NOT DETECTED Final    Comment: Performed at [facility], [address] [place], [place] [address]  Culture, blood (routine x 2)     Status: None   Collection Time: 08/10/21  2:32 PM   Specimen: Left Antecubital; Blood  Result Value Ref Range Status    Specimen Description LEFT ANTECUBITAL  Final   Special Requests   Final    BOTTLES DRAWN AEROBIC AND ANAEROBIC Blood Culture adequate volume   Culture   Final    NO GROWTH 5 DAYS Performed at [facility], [address] [place], [place] [address]    Report Status 08/15/2021 FINAL  Final  Culture, blood (routine x 2)     Status: None   Collection Time: 08/10/21  2:32 PM   Specimen: BLOOD LEFT HAND  Result Value Ref Range Status   Specimen Description BLOOD LEFT HAND  Final   Special Requests   Final    BOTTLES DRAWN AEROBIC AND ANAEROBIC Blood Culture results may not be optimal due to an excessive volume of blood received in culture bottles   Culture   Final    NO GROWTH 5 DAYS Performed at [facility], [address], [place], [place] [address]    Report Status 08/15/2021 FINAL  Final     Radiology Studies: DG Chest 1 View  Result Date: 08/17/2021 CLINICAL DATA:  Nasogastric tube placement. EXAM: CHEST  1 VIEW COMPARISON:  04/21/2021.  Chest CTA dated 08/12/2021. FINDINGS: Very poor depth of inspiration with no gross change in mild elevation of the right hemidiaphragm. Mild bibasilar atelectasis. Interval small, oval nodular density in the lateral aspect of the mid right lung. No nodule at that location on the recent CTA. Normal sized heart. Unremarkable bones. IMPRESSION: 1. Very poor inspiration with mild bibasilar atelectasis. 2. Interval small nodular density in the lateral aspect of the mid right lung, not present on the CTA 5 days ago. This can be re-evaluated at the time of follow-up radiographs. Electronically Signed   By: Claudie Revering M.D.   On: 08/17/2021 10:51   CT ABDOMEN PELVIS W CONTRAST  Result Date: 08/16/2021 CLINICAL DATA:  Abdominal pain EXAM: CT ABDOMEN AND PELVIS WITH CONTRAST TECHNIQUE: Multidetector CT imaging of the abdomen and pelvis was performed using the standard protocol following bolus administration of intravenous contrast. RADIATION DOSE REDUCTION: This  exam was performed according to the departmental dose-optimization program which includes automated exposure control, adjustment of the mA and/or kV according to patient size and/or use of iterative reconstruction technique. CONTRAST:  136m OMNIPAQUE IOHEXOL 300 MG/ML  SOLN COMPARISON:  08/10/2021 FINDINGS: Lower chest: There are small patchy infiltrates in both lower lobes with interval worsening. Hepatobiliary: There is fatty  infiltration in the liver. Gallbladder is unremarkable. Pancreas: There is pancreatic atrophy. No focal abnormality is seen. Spleen: Unremarkable. Adrenals/Urinary Tract: Adrenals are not enlarged. There is no hydronephrosis. There are no renal or ureteral stones. Urinary bladder is not distended. Stomach/Bowel: There is marked distention of stomach. There is fluid in the lumen of visualized lower thoracic esophagus, possibly suggesting reflux. There is abnormal dilation of small-bowel loops. Small bowel loops measure up to 4.8 cm. There is a ventral hernia containing portions of small bowel loops and colon in the left lower quadrant. There is transition in the diameter of small-bowel loops within the hernia. There is mild diffuse wall thickening in the visualized portions of colon. There is liquid stool in the colon. There is decompression of sigmoid colon and rectum. Vascular/Lymphatic: Unremarkable. Reproductive: Other: There is no ascites or pneumoperitoneum. There is edema in subcutaneous plane in the abdominal wall, more so on the left side without loculated fluid collections. Musculoskeletal: Unremarkable. IMPRESSION: There is interval marked distention of stomach with fluid. There is abnormal dilation of proximal small bowel loops measuring up to 4.8 cm in diameter suggesting high-grade partial obstruction. Zone of transition in the small bowel loops appears to be within a ventral hernia in the left lower quadrant. Partly decompressed distal small bowel loops are seen within the  hernial sac in the left lower quadrant. There is wall thickening along with fluid in the lumen in the ascending, transverse and descending colon suggesting colitis. Wall thickening in the colon appears less prominent. There is decompression of sigmoid colon. Findings suggest colitis and partial obstruction of left colon within the hernial sac in the left lower quadrant. There is no hydronephrosis. There are patchy infiltrates in both lower lung fields suggesting atelectasis/pneumonia. There is fluid in the lumen of thoracic esophagus suggesting gastroesophageal reflux. Fatty liver. There is edema in subcutaneous plane, possibly suggesting anasarca. There are no loculated fluid collections in the abdominal wall. Other findings as described in the body of the report. Electronically Signed   By: Elmer Picker M.D.   On: 08/16/2021 12:45   US Venous Img Upper Uni Right(DVT)  Result Date: 08/17/2021 CLINICAL DATA:  51 year old female with elevated D-dimer, concern for right upper extremity deep vein thrombosis. EXAM: RIGHT UPPER EXTREMITY VENOUS DOPPLER ULTRASOUND TECHNIQUE: Gray-scale sonography with graded compression, as well as color Doppler and duplex ultrasound were performed to evaluate the upper extremity deep venous system from the level of the subclavian vein and including the jugular, axillary, basilic, radial, ulnar and upper cephalic vein. Spectral Doppler was utilized to evaluate flow at rest and with distal augmentation maneuvers. COMPARISON:  None. FINDINGS: Contralateral Subclavian Vein: Respiratory phasicity is normal and symmetric with the symptomatic side. No evidence of thrombus. Normal compressibility. Internal Jugular Vein: No evidence of thrombus. Normal compressibility, respiratory phasicity and response to augmentation. Subclavian Vein: No evidence of thrombus. Normal compressibility, respiratory phasicity and response to augmentation. Axillary Vein: No evidence of thrombus. Normal  compressibility, respiratory phasicity and response to augmentation. Cephalic Vein: No evidence of thrombus. Normal compressibility, respiratory phasicity and response to augmentation. Basilic Vein: No evidence of thrombus. Normal compressibility, respiratory phasicity and response to augmentation. Brachial Veins: No evidence of thrombus. Normal compressibility, respiratory phasicity and response to augmentation. Radial Veins: No evidence of thrombus. Normal compressibility, respiratory phasicity and response to augmentation. Ulnar Veins: No evidence of thrombus. Normal compressibility, respiratory phasicity and response to augmentation. Other Findings:  None visualized. IMPRESSION: No evidence of deep vein thrombosis within the  right upper extremity. Ruthann Cancer, MD [facility] Electronically Signed   By: Ruthann Cancer M.D.   On: 08/17/2021 07:38   [place] EKG SITE RITE  Result Date: 08/17/2021 If Site Rite image not attached, placement could not be confirmed due to current cardiac rhythm.    Scheduled Meds:  Chlorhexidine Gluconate Cloth  6 each Topical Daily   folic acid  1 mg Oral Daily   heparin  5,000 Units Subcutaneous Q8H   metoprolol tartrate  2.5 mg Intravenous Q6H   multivitamin with minerals  1 tablet Oral Daily   pantoprazole (PROTONIX) IV  40 mg Intravenous Q12H   sodium chloride flush  10-40 mL Intracatheter Q12H   temazepam  7.5 mg Oral QHS   thiamine  100 mg Oral Daily   Or   thiamine  100 mg Intravenous Daily   Continuous Infusions:  ciprofloxacin 400 mg (08/17/21 1634)   lactated ringers 100 mL/hr at 08/17/21 1626   metronidazole 500 mg (08/16/21 1709)   potassium chloride       LOS: 6 days    Barton Dubois, MD [facility]  If 7PM-7AM, please contact night-coverage [email or web address] 08/17/2021, 6:20 PM

## 2021-08-17 NOTE — Progress Notes (Signed)
PHARMACY - TOTAL PARENTERAL NUTRITION CONSULT NOTE   Indication: Small bowel obstruction  Patient Measurements: Height: 4' 10"  (147.3 cm) Weight: 85.6 kg (188 lb 11.4 oz) IBW/kg (Calculated) : 40.9 TPN AdjBW (KG): 49 Body mass index is 39.44 kg/m.   Assessment:  51 yo female with medical history significant for large parastomal hernia and recent colitis with high output from her ostomy now with concern for SBO. Repeat CT 01/31 verified partial bowel obstruction. Patient remains NPO. Pharmacy consulted to initiate TPN.   Central access: pending TPN start date: pending  RD Assessment: Estimated Needs Total Energy Estimated Needs: 1500-1700 Total Protein Estimated Needs: 90-99 gr Total Fluid Estimated Needs: >1700 ml daily  Current Nutrition:  NPO  Plan:  Continue IVF this evening Start TPN 2/01 once PICC line established Monitor TPN labs on Mon/Thurs, PRN   Thank you for allowing pharmacy to be a part of this patients care.  Ardyth Harps, PharmD Clinical Pharmacist

## 2021-08-17 NOTE — Progress Notes (Signed)
[facility] Progress Note     Subjective: Patient seen by my partners earlier in her admission for colitis in the setting of prior diverting loop colostomy for necrotizing fasciitis in 2021.  She had been having high output from her ostomy and had electrolyte abnormalities and was treated for colitis. She had a large associated parastomal hernia.  She has since had improvement on antibiotics for the colitis but developed worsening abdominal pain and a repeat CT was done yesterday that demonstrated a partial bowel obstruction.  She is having output from the ostomy with liquid stool in the bag and the ostomy is pink. She is hiccuping and has a vomit bag at her side.  She says she feels a little better.  She was seen by Leighton Ruff [id number] to discuss reversal but given her weight loss, nutrition status this was post poned. She says that she is sick of the ostomy and the issues it has caused.   Objective: Vital signs in last 24 hours: Temp:  [97.3 F (36.3 C)-99 F (37.2 C)] 97.8 F (36.6 C) (02/01 0732) Pulse Rate:  [96-120] 111 (02/01 0732) Resp:  [16-46] 29 (02/01 0732) BP: (86-146)/(42-126) 105/79 (02/01 0600) SpO2:  [89 %-100 %] 99 % (02/01 0732) Weight:  [85.6 kg] 85.6 kg (02/01 0545) Last BM Date: 08/16/21  Intake/Output from previous day: 01/31 0701 - 02/01 0700 In: 554.9 [I.V.:63.3; IV Piggyback:491.7] Out: 400 [Urine:400] Intake/Output this shift: No intake/output data recorded.  General appearance: alert and no distress Resp: normal work of breathing GI: soft, mildly distended, loop colostomy on left with large parastomal hernia, partially reducible, ostomy pink and liquid stool in bag, tender but no rebound or guarding   Lab Results:  Recent Labs    08/16/21 0351 08/17/21 0700  WBC 16.7* 19.2*  HGB 11.1* 12.1  HCT 34.3* 38.5  PLT 559* 652*   BMET Recent Labs    08/15/21 0245 08/17/21 0700  NA 137 134*  K 4.1 3.4*  CL 107 102  CO2 21*  19*  GLUCOSE 99 107*  BUN <5* 17  CREATININE 0.39* 1.36*  CALCIUM 8.4* 8.6*   PT/INR No results for input(s): LABPROT, INR in the last 72 hours.  Personally reviewed- dilated small bowel loops with improved thickening of the colon from colitis, distended stomach, hernia defect 10+cm with small bowel and large bowel Studies/Results: CT ABDOMEN PELVIS W CONTRAST  Result Date: 08/16/2021 CLINICAL DATA:  Abdominal pain EXAM: CT ABDOMEN AND PELVIS WITH CONTRAST TECHNIQUE: Multidetector CT imaging of the abdomen and pelvis was performed using the standard protocol following bolus administration of intravenous contrast. RADIATION DOSE REDUCTION: This exam was performed according to the departmental dose-optimization program which includes automated exposure control, adjustment of the mA and/or kV according to patient size and/or use of iterative reconstruction technique. CONTRAST:  18m OMNIPAQUE IOHEXOL 300 MG/ML  SOLN COMPARISON:  08/10/2021 FINDINGS: Lower chest: There are small patchy infiltrates in both lower lobes with interval worsening. Hepatobiliary: There is fatty infiltration in the liver. Gallbladder is unremarkable. Pancreas: There is pancreatic atrophy. No focal abnormality is seen. Spleen: Unremarkable. Adrenals/Urinary Tract: Adrenals are not enlarged. There is no hydronephrosis. There are no renal or ureteral stones. Urinary bladder is not distended. Stomach/Bowel: There is marked distention of stomach. There is fluid in the lumen of visualized lower thoracic esophagus, possibly suggesting reflux. There is abnormal dilation of small-bowel loops. Small bowel loops measure up to 4.8 cm. There is a ventral hernia containing portions of small  bowel loops and colon in the left lower quadrant. There is transition in the diameter of small-bowel loops within the hernia. There is mild diffuse wall thickening in the visualized portions of colon. There is liquid stool in the colon. There is  decompression of sigmoid colon and rectum. Vascular/Lymphatic: Unremarkable. Reproductive: Other: There is no ascites or pneumoperitoneum. There is edema in subcutaneous plane in the abdominal wall, more so on the left side without loculated fluid collections. Musculoskeletal: Unremarkable. IMPRESSION: There is interval marked distention of stomach with fluid. There is abnormal dilation of proximal small bowel loops measuring up to 4.8 cm in diameter suggesting high-grade partial obstruction. Zone of transition in the small bowel loops appears to be within a ventral hernia in the left lower quadrant. Partly decompressed distal small bowel loops are seen within the hernial sac in the left lower quadrant. There is wall thickening along with fluid in the lumen in the ascending, transverse and descending colon suggesting colitis. Wall thickening in the colon appears less prominent. There is decompression of sigmoid colon. Findings suggest colitis and partial obstruction of left colon within the hernial sac in the left lower quadrant. There is no hydronephrosis. There are patchy infiltrates in both lower lung fields suggesting atelectasis/pneumonia. There is fluid in the lumen of thoracic esophagus suggesting gastroesophageal reflux. Fatty liver. There is edema in subcutaneous plane, possibly suggesting anasarca. There are no loculated fluid collections in the abdominal wall. Other findings as described in the body of the report. Electronically Signed   By: Elmer Picker M.D.   On: 08/16/2021 12:45   US Venous Img Upper Uni Right(DVT)  Result Date: 08/17/2021 CLINICAL DATA:  51 year old female with elevated D-dimer, concern for right upper extremity deep vein thrombosis. EXAM: RIGHT UPPER EXTREMITY VENOUS DOPPLER ULTRASOUND TECHNIQUE: Gray-scale sonography with graded compression, as well as color Doppler and duplex ultrasound were performed to evaluate the upper extremity deep venous system from the level of  the subclavian vein and including the jugular, axillary, basilic, radial, ulnar and upper cephalic vein. Spectral Doppler was utilized to evaluate flow at rest and with distal augmentation maneuvers. COMPARISON:  None. FINDINGS: Contralateral Subclavian Vein: Respiratory phasicity is normal and symmetric with the symptomatic side. No evidence of thrombus. Normal compressibility. Internal Jugular Vein: No evidence of thrombus. Normal compressibility, respiratory phasicity and response to augmentation. Subclavian Vein: No evidence of thrombus. Normal compressibility, respiratory phasicity and response to augmentation. Axillary Vein: No evidence of thrombus. Normal compressibility, respiratory phasicity and response to augmentation. Cephalic Vein: No evidence of thrombus. Normal compressibility, respiratory phasicity and response to augmentation. Basilic Vein: No evidence of thrombus. Normal compressibility, respiratory phasicity and response to augmentation. Brachial Veins: No evidence of thrombus. Normal compressibility, respiratory phasicity and response to augmentation. Radial Veins: No evidence of thrombus. Normal compressibility, respiratory phasicity and response to augmentation. Ulnar Veins: No evidence of thrombus. Normal compressibility, respiratory phasicity and response to augmentation. Other Findings:  None visualized. IMPRESSION: No evidence of deep vein thrombosis within the right upper extremity. Ruthann Cancer, MD [facility] Radiology Electronically Signed   By: Ruthann Cancer M.D.   On: 08/17/2021 07:38    Anti-infectives: Anti-infectives (From admission, onward)    Start     Dose/Rate Route Frequency Ordered Stop   08/10/21 1000  ciprofloxacin (CIPRO) IVPB 400 mg        400 mg 200 mL/hr over 60 Minutes Intravenous Every 12 hours 08/10/21 0824     08/10/21 0915  metroNIDAZOLE (  FLAGYL) IVPB 500 mg        500 mg 100 mL/hr over 60 Minutes  Intravenous Every 8 hours 08/10/21 6222         Assessment/Plan: Patient with large parastomal hernia and pSBO now in the setting of this hernia, she also had recent colitis and high output from her ostomy but not decreased. She has a very distended stomach and is vomiting.  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 Marcello Moores seeing her in August, given the size of the parastomal hernia and defect she will need some reconstruction with reversal or maintaining the colostomy and this is not something we do at [facility], if she needs to have surgery in the next few days because this does not resolve, will have to discuss with [facility] to get them on board   No surgical intervention indicated at this time, but may require if does not improve clinically.   Updated Team.    LOS: 6 days    Virl Cagey 08/17/2021

## 2021-08-17 NOTE — Progress Notes (Signed)
Nutrition Follow-up  DOCUMENTATION CODES:   Non-severe (moderate) malnutrition in context of acute illness/injury, Obesity unspecified  INTERVENTION:   Recommend consider TPN initiation if pt unable to advance diet.    NUTRITION DIAGNOSIS:   Moderate Malnutrition related to acute illness (dehydration, necrotizing faciitis, diverting colostomy) as evidenced by energy intake < or equal to 50% for > or equal to 5 days, mild muscle depletion.  -ongoing with NPO status   GOAL:   Patient will meet greater than or equal to 90% of their needs  -NOT met  MONITOR:   PO intake, Supplement acceptance, Labs, Weight trends, I & O's  REASON FOR ASSESSMENT:   Malnutrition Screening Tool    ASSESSMENT:  Patient is a 51 yo hx of necrotizing fascitis s/p diverting colostomy. Presents with electrolyte disturbance, dehydration.   pSBO with large parastomal hernia, high output through ostomy. Per MD-note pt has been vomiting this morning and distended stomach.  Patient has NG placed and output- 900 ml. NPO due to obstruction. Patient discussed at interdisciplinary rounds and with bedside nursing. Patient is alone in room.   Patient has not been eating at baseline for ~ 3 weeks.  If patient continues to be unable to take nutrition orally recommend consider starting TPN given her prolonged poor oral intake, compromised nutrition status and concern for potential further surgical involvement.  Weights noted.   Intake/Output Summary (Last 24 hours) at 08/17/2021 1112 Last data filed at 08/17/2021 0925 Gross per 24 hour  Intake 554.91 ml  Output 1300 ml  Net -745.09 ml   Medications reviewed. Lactated Ringers @ 100 ml/hr Antibiotics- Cipro and Flagyl  Labs: BMP Latest Ref Rng & Units 08/17/2021 08/15/2021 08/14/2021  Glucose 70 - 99 mg/dL 107(H) 99 113(H)  BUN 6 - 20 mg/dL 17 <5(L) <5(L)  Creatinine 0.44 - 1.00 mg/dL 1.36(H) 0.39(L) 0.39(L)  BUN/Creat Ratio 6 - 22 (calc) - - -  Sodium 135 -  145 mmol/L 134(L) 137 136  Potassium 3.5 - 5.1 mmol/L 3.4(L) 4.1 4.2  Chloride 98 - 111 mmol/L 102 107 107  CO2 22 - 32 mmol/L 19(L) 21(L) 21(L)  Calcium 8.9 - 10.3 mg/dL 8.6(L) 8.4(L) 8.5(L)     Diet Order:   Diet Order             Diet NPO time specified Except for: Ice Chips  Diet effective now                   EDUCATION NEEDS:   Education needs have been addressed  Skin:  Skin Assessment: Reviewed RN Assessment  Last BM:  1/29 via colostomy  Height:   Ht Readings from Last 1 Encounters:  08/10/21 4' 10"  (1.473 m)    Weight:   Wt Readings from Last 1 Encounters:  08/17/21 85.6 kg    Ideal Body Weight:   45 kg  BMI:  Body mass index is 39.44 kg/m.  Estimated Nutritional Needs:   Kcal:  1500-1700  Protein:  90-99 gr  Fluid:  >1700 ml daily   Colman Cater MS,RD,CSG,LDN Contact: Shea Evans

## 2021-08-18 ENCOUNTER — Inpatient Hospital Stay (HOSPITAL_COMMUNITY): Payer: Self-pay

## 2021-08-18 DIAGNOSIS — K56609 Unspecified intestinal obstruction, unspecified as to partial versus complete obstruction: Secondary | ICD-10-CM

## 2021-08-18 LAB — CBC WITH DIFFERENTIAL/PLATELET
Band Neutrophils: 2 %
Basophils Absolute: 0 10*3/uL (ref 0.0–0.1)
Basophils Relative: 0 %
Eosinophils Absolute: 0 10*3/uL (ref 0.0–0.5)
Eosinophils Relative: 0 %
HCT: 28.8 % — ABNORMAL LOW (ref 36.0–46.0)
Hemoglobin: 9.6 g/dL — ABNORMAL LOW (ref 12.0–15.0)
Lymphocytes Relative: 8 %
Lymphs Abs: 1.6 10*3/uL (ref 0.7–4.0)
MCH: 31.4 pg (ref 26.0–34.0)
MCHC: 33.3 g/dL (ref 30.0–36.0)
MCV: 94.1 fL (ref 80.0–100.0)
Metamyelocytes Relative: 2 %
Monocytes Absolute: 2 10*3/uL — ABNORMAL HIGH (ref 0.1–1.0)
Monocytes Relative: 10 %
Myelocytes: 1 %
Neutro Abs: 16 10*3/uL — ABNORMAL HIGH (ref 1.7–7.7)
Neutrophils Relative %: 77 %
Platelets: 607 10*3/uL — ABNORMAL HIGH (ref 150–400)
RBC: 3.06 MIL/uL — ABNORMAL LOW (ref 3.87–5.11)
RDW: 16.6 % — ABNORMAL HIGH (ref 11.5–15.5)
WBC: 20.2 10*3/uL — ABNORMAL HIGH (ref 4.0–10.5)
nRBC: 0 % (ref 0.0–0.2)

## 2021-08-18 LAB — MAGNESIUM: Magnesium: 2.8 mg/dL — ABNORMAL HIGH (ref 1.7–2.4)

## 2021-08-18 LAB — BASIC METABOLIC PANEL
Anion gap: 10 (ref 5–15)
BUN: 19 mg/dL (ref 6–20)
CO2: 21 mmol/L — ABNORMAL LOW (ref 22–32)
Calcium: 8.1 mg/dL — ABNORMAL LOW (ref 8.9–10.3)
Chloride: 104 mmol/L (ref 98–111)
Creatinine, Ser: 0.92 mg/dL (ref 0.44–1.00)
GFR, Estimated: >60 mL/min (ref >60)
Glucose, Bld: 87 mg/dL (ref 70–99)
Potassium: 3.7 mmol/L (ref 3.5–5.1)
Sodium: 135 mmol/L (ref 135–145)

## 2021-08-18 LAB — TRIGLYCERIDES: Triglycerides: 54 mg/dL (ref <150)

## 2021-08-18 LAB — HEMOGLOBIN A1C
Hgb A1c MFr Bld: 5.1 % (ref 4.8–5.6)
Mean Plasma Glucose: 99.67 mg/dL

## 2021-08-18 LAB — GLUCOSE, CAPILLARY: Glucose-Capillary: 107 mg/dL — ABNORMAL HIGH (ref 70–99)

## 2021-08-18 LAB — PHOSPHORUS: Phosphorus: 3.7 mg/dL (ref 2.5–4.6)

## 2021-08-18 MED ORDER — MELATONIN 3 MG PO TABS
6.0000 mg | ORAL_TABLET | Freq: Every day | ORAL | Status: DC
  Filled 2021-08-18: qty 2

## 2021-08-18 MED ORDER — ORAL CARE MOUTH RINSE
15.0000 mL | Freq: Two times a day (BID) | OROMUCOSAL | Status: DC
  Administered 2021-08-18 – 2021-08-27 (×16): 15 mL via OROMUCOSAL

## 2021-08-18 MED ORDER — TRAVASOL 10 % IV SOLN
INTRAVENOUS | Status: AC
  Filled 2021-08-18: qty 460.8

## 2021-08-18 MED ORDER — INSULIN ASPART 100 UNIT/ML IJ SOLN
0.0000 [IU] | Freq: Four times a day (QID) | INTRAMUSCULAR | Status: DC
  Administered 2021-08-19 (×2): 2 [IU] via SUBCUTANEOUS
  Administered 2021-08-20: 3 [IU] via SUBCUTANEOUS
  Administered 2021-08-20 – 2021-08-23 (×9): 2 [IU] via SUBCUTANEOUS

## 2021-08-18 MED ORDER — SODIUM CHLORIDE 0.9 % IV BOLUS
500.0000 mL | Freq: Once | INTRAVENOUS | Status: AC
Start: 2021-08-18 — End: 2021-08-18
  Administered 2021-08-18: 500 mL via INTRAVENOUS

## 2021-08-18 MED ORDER — NOREPINEPHRINE 4 MG/250ML-% IV SOLN
0.0000 ug/min | INTRAVENOUS | Status: DC
  Administered 2021-08-18: 2 ug/min via INTRAVENOUS
  Filled 2021-08-18: qty 250

## 2021-08-18 MED ORDER — MORPHINE SULFATE (PF) 2 MG/ML IV SOLN
2.0000 mg | INTRAVENOUS | Status: DC | PRN
  Administered 2021-08-19 (×3): 2 mg via INTRAVENOUS
  Filled 2021-08-18 (×3): qty 1

## 2021-08-18 MED ORDER — LACTATED RINGERS IV SOLN: INTRAVENOUS | Status: DC

## 2021-08-18 MED ORDER — CHLORHEXIDINE GLUCONATE 0.12 % MT SOLN
15.0000 mL | Freq: Two times a day (BID) | OROMUCOSAL | Status: DC
  Administered 2021-08-18 – 2021-08-27 (×17): 15 mL via OROMUCOSAL
  Filled 2021-08-18 (×17): qty 15

## 2021-08-18 MED ORDER — METHOCARBAMOL 1000 MG/10ML IJ SOLN
INTRAMUSCULAR | Status: AC
  Filled 2021-08-18: qty 10

## 2021-08-18 NOTE — Progress Notes (Signed)
PHARMACY - TOTAL PARENTERAL NUTRITION CONSULT NOTE   Indication: Small bowel obstruction  Patient Measurements: Height: 4' 10"  (147.3 cm) Weight: 84.7 kg (186 lb 11.7 oz) IBW/kg (Calculated) : 40.9 TPN AdjBW (KG): 49 Body mass index is 39.03 kg/m. Usual Weight:   Assessment:  51 yo female with medical history significant for large parastomal hernia and recent colitis with high output from her ostomy now with concern for SBO. Repeat CT 01/31 verified partial bowel obstruction. Patient remains NPO. Pharmacy consulted to initiate TPN.   Glucose / Insulin:  87-107 Electrolytes: mag 2.8 WNL for others Renal: Scr 0.92 Hepatic: WNL Intake / Output; MIVF: LR @ 100 mL/hr   Central access: PICC 2/1 TPN start date: 2/2  Nutritional Goals: Goal TPN rate is 80 mL/hr (provides 92 g of protein and 1667 kcals per day)  RD Assessment: Estimated Needs Total Energy Estimated Needs: 1500-1700 Total Protein Estimated Needs: 90-99 gr Total Fluid Estimated Needs: >1700 ml daily  Current Nutrition:  NPO  Plan:  Start TPN at 45m/hr at 1800 Electrolytes in TPN: Na 527m/L, K 5064mL, Ca 5mE19m, Mg 0mEq73m and Phos 15mmo63m Cl:Ac 1:1 Add standard MVI and trace elements to TPN Add folic acid and thiamine- discontinue previous orders for them Initiate Moderate q6h SSI and adjust as needed  Reduce MIVF to 60 mL/hr at 1800 Monitor TPN labs on Mon/Thurs,   StevenMargot AblesmD Clinical Pharmacist 08/18/2021 11:06 AM

## 2021-08-18 NOTE — Progress Notes (Signed)
[facility] Progress Note     Subjective: NG was clogged this AM and fixed by RN, output was 1200 immediately and another 500, this was right after the Kub this AM with the gastric distention.    Continues to have some generalized pain. Some output in ostomy continues.    Objective: Vital signs in last 24 hours: Temp:  [98.2 F (36.8 C)-100 F (37.8 C)] 100 F (37.8 C) (02/02 1100) Pulse Rate:  [106-123] 114 (02/02 1100) Resp:  [25-40] 26 (02/02 1100) BP: (77-111)/(37-84) 88/62 (02/02 1100) SpO2:  [97 %-100 %] 98 % (02/02 1100) Weight:  [84.7 kg] 84.7 kg (02/02 0500) Last BM Date: 08/18/21  Intake/Output from previous day: 02/01 0701 - 02/02 0700 In: 2438.8 [I.V.:1476.9; IV Piggyback:961.9] Out: 2970 [Urine:150; Emesis/NG output:2520; Stool:300] Intake/Output this shift: Total I/O In: 0 [I.V.:0] Out: 1200 [Emesis/NG output:1200]  General appearance: alert and no distress Resp: normal work of breathing GI: soft, large parastomal hernia with ostomy that is pink and making liquid stool, hernia reduces easily but returns immediately   Lab Results:  Recent Labs    08/17/21 0700 08/18/21 0221  WBC 19.2* 20.2*  HGB 12.1 9.6*  HCT 38.5 28.8*  PLT 652* 607*   BMET Recent Labs    08/17/21 0700 08/18/21 0221  NA 134* 135  K 3.4* 3.7  CL 102 104  CO2 19* 21*  GLUCOSE 107* 87  BUN 17 19  CREATININE 1.36* 0.92  CALCIUM 8.6* 8.1*   PT/INR No results for input(s): LABPROT, INR in the last 72 hours.  Studies/Results: DG Chest 1 View  Result Date: 08/17/2021 CLINICAL DATA:  Nasogastric tube placement. EXAM: CHEST  1 VIEW COMPARISON:  04/21/2021.  Chest CTA dated 08/12/2021. FINDINGS: Very poor depth of inspiration with no gross change in mild elevation of the right hemidiaphragm. Mild bibasilar atelectasis. Interval small, oval nodular density in the lateral aspect of the mid right lung. No nodule at that location on the recent CTA. Normal sized heart.  Unremarkable bones. IMPRESSION: 1. Very poor inspiration with mild bibasilar atelectasis. 2. Interval small nodular density in the lateral aspect of the mid right lung, not present on the CTA 5 days ago. This can be re-evaluated at the time of follow-up radiographs. Electronically Signed   By: Claudie Revering M.D.   On: 08/17/2021 10:51   DG Abd 1 View  Result Date: 08/18/2021 CLINICAL DATA:  Large parastomal hernia.  Recurrent colitis. EXAM: ABDOMEN - 1 VIEW COMPARISON:  None. FINDINGS: Severe gaseous distension of the stomach. Insert gastric tube with the tip projecting over the stomach. Gaseous distension of the small bowel. No bowel dilatation to suggest obstruction. No evidence of pneumoperitoneum, portal venous gas or pneumatosis. No pathologic calcifications along the expected course of the ureters. No acute osseous abnormality. IMPRESSION: 1. Severe gaseous distension of the stomach with a nasogastric tube in place in the stomach. Gaseous distension of the small bowel. Overall findings concerning for persistent small bowel obstruction. Electronically Signed   By: Kathreen Devoid M.D.   On: 08/18/2021 10:03   DG Abd 1 View  Result Date: 08/17/2021 CLINICAL DATA:  Encounter for NG tube placement. Pt admitted for SBO, c/o SOB. Hx of HTN, pt has colostomy. EXAM: ABDOMEN - 1 VIEW COMPARISON:  CT abdomen pelvis 08/16/2021 FINDINGS: Enteric tube coursing below the hemidiaphragm with tip and side port overlying the gastric lumen. The stomach is dilated with gas. No radio-opaque calculi or other significant radiographic abnormality are seen. IMPRESSION:  1. Enteric tube in good position. 2. Obstructive bowel gas pattern again noted. Electronically Signed   By: Iven Finn M.D.   On: 08/17/2021 23:52   US Venous Img Upper Uni Right(DVT)  Result Date: 08/17/2021 CLINICAL DATA:  51 year old female with elevated D-dimer, concern for right upper extremity deep vein thrombosis. EXAM: RIGHT UPPER EXTREMITY VENOUS  DOPPLER ULTRASOUND TECHNIQUE: Gray-scale sonography with graded compression, as well as color Doppler and duplex ultrasound were performed to evaluate the upper extremity deep venous system from the level of the subclavian vein and including the jugular, axillary, basilic, radial, ulnar and upper cephalic vein. Spectral Doppler was utilized to evaluate flow at rest and with distal augmentation maneuvers. COMPARISON:  None. FINDINGS: Contralateral Subclavian Vein: Respiratory phasicity is normal and symmetric with the symptomatic side. No evidence of thrombus. Normal compressibility. Internal Jugular Vein: No evidence of thrombus. Normal compressibility, respiratory phasicity and response to augmentation. Subclavian Vein: No evidence of thrombus. Normal compressibility, respiratory phasicity and response to augmentation. Axillary Vein: No evidence of thrombus. Normal compressibility, respiratory phasicity and response to augmentation. Cephalic Vein: No evidence of thrombus. Normal compressibility, respiratory phasicity and response to augmentation. Basilic Vein: No evidence of thrombus. Normal compressibility, respiratory phasicity and response to augmentation. Brachial Veins: No evidence of thrombus. Normal compressibility, respiratory phasicity and response to augmentation. Radial Veins: No evidence of thrombus. Normal compressibility, respiratory phasicity and response to augmentation. Ulnar Veins: No evidence of thrombus. Normal compressibility, respiratory phasicity and response to augmentation. Other Findings:  None visualized. IMPRESSION: No evidence of deep vein thrombosis within the right upper extremity. Ruthann Cancer, MD [facility] Radiology Electronically Signed   By: Ruthann Cancer M.D.   On: 08/17/2021 07:38   DG CHEST PORT 1 VIEW  Result Date: 08/17/2021 CLINICAL DATA:  NG tube placement. EXAM: PORTABLE CHEST 1 VIEW COMPARISON:  Chest x-ray 08/17/2021.  FINDINGS: Enteric tube tip extends into the stomach, distal tip is not included, but is likely beyond the mid stomach. There is gastric dilatation as seen on prior CT. There is left basilar atelectasis. Right-sided central venous catheter tip projects over the distal SVC. Cardiomediastinal silhouette is unchanged, the heart is enlarged. There is no pneumothorax or acute fracture. IMPRESSION: 1. NG tube reaches the mid stomach. The distal tip is not included on this image. 2. Stable dilated stomach. Electronically Signed   By: Ronney Asters M.D.   On: 08/17/2021 23:44   [place] EKG SITE RITE  Result Date: 08/17/2021 If Site Rite image not attached, placement could not be confirmed due to current cardiac rhythm.   Anti-infectives: Anti-infectives (From admission, onward)    Start     Dose/Rate Route Frequency Ordered Stop   08/10/21 1000  ciprofloxacin (CIPRO) IVPB 400 mg        400 mg 200 mL/hr over 60 Minutes Intravenous Every 12 hours 08/10/21 0824     08/10/21 0915  metroNIDAZOLE (FLAGYL) IVPB 500 mg        500 mg 100 mL/hr over 60 Minutes Intravenous Every 8 hours 08/10/21 0938         Assessment/Plan: Ms. Medinger is a 51 yo with multiple medical issues and failure to thrive who was admitted with colitis and was seen by my partners earlier. She then went form high output from the ostomy to low output and SBO related to the parastomal hernia. I can reduce the hernia but it comes right back. She is having NG output that continues. She has  been on antibiotics for the colitis and leukocytosis is worsening but exam is reassuring and ostomy is pink.  Discussed with patient and Dr. Dyann Kief. I am worried she is going to need intervention for the SBO related to the parastomal hernia and that she will need some reconstruction given the size and almost like a loss of domain around the left side where the hernia is located.  This is more extensive of a repair than we do at  [facility]. She had her original  surgery at [place] and has followed with them discussing reversal.   I will reach out to [facility] and get their input. She may be able to avoid surgery, but better to get them on board sooner.    LOS: 7 days    Virl Cagey 08/18/2021

## 2021-08-18 NOTE — Progress Notes (Signed)
[facility]  Discussed with Dr. Georgette Dover, right now continue to plan for ng and npo to see if can get this resolved without OR. Nothing pushing towards OR right now, exam reassuring and able to reduce. Hoping with improved NG suction bowel will decompress and obstructive point will release.  They are aware pending any changes or need for surgery. Will keep at [facility] for now.  Curlene Labrum, MD

## 2021-08-18 NOTE — Progress Notes (Signed)
PROGRESS NOTE    Alyssa Carey  LGX:[id number] DOB: Jan 03, 1971 DOA: 08/09/2021 PCP: Caryl Bis, MD   Brief Narrative:   Alyssa Carey  is a 51 y.o. 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  Patient was admitted with pancolitis and associated dehydration as well as electrolyte abnormalities with hypokalemia and hypomagnesemia in the setting of ongoing high output/diarrhea.  She has been started on ciprofloxacin and Flagyl empirically on 1/25 and GI pathogen panel as well as C. difficile testing has been ordered which have now returned negative.  General surgery had initially evaluated patient on account of the parastomal hernia, but patient was improving and therefore they had signed off.  She now is having some persistent nausea and vomiting along with abdominal pain and repeat CT imaging on 1/31 demonstrates high-grade bowel obstruction findings for which general surgery has been reconsulted and has recommended patient to be n.p.o. and to place NG tube.    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 difficile testing negative -No recent use of antibiotics -Continue supportive care.  New findings of high-grade small bowel obstruction in the setting of parastomal hernia -General surgery recommended n.p.o. status, NG tube and follow clinical response.  Patient facing the need of surgical intervention; but is stable to continue conservative management for now. -Will start TPN for nutrition purposes.  Dehydration  secondary to above -With noted high ostomy output at time of admission, now decreasing -Continue IV fluid resuscitation. -Continue to follow clinical response and provide support.   Sinus tachycardia-ongoing -Likely related to the dehydration as well as persistent electrolyte abnormalities -No sign of anxiety noted -TSH 0.6 -D-dimer noted to be 5.71, will CT angiogram negative for PE -Check urine tox and echo 1/29 -Started on metoprolol 1/30 after discussion with cardiology continue adjusted dose of metoprolol 25 mg twice a day currently IV as patient unable to tolerate p.o.   Moderate protein calorie malnutrition -Currently n.p.o. -NG tube will be placed follow recommendation by general surgery -Patient initiated on TPN management. -Follow clinical response and continue supportive care.   FOBT positive -Continue to monitor hemoglobin trend; no overt bleeding appreciated. -Continue treatment for PPI as mentioned below.   History of PUD/alcoholic gastritis -Continue IV PPI   Alcoholic liver disease -Continue CIWA protocol -No active withdrawal symptoms appreciated. -Cessation counseling provided.  Leukocytosis -In the setting of pancolitis (and chemo concentration from severe dehydration. -Continue current antibiotic therapy and fluid resuscitation -Follow WBCs trend.     DVT prophylaxis: Heparin Code Status: Full Family Communication: None at bedside Disposition Plan:  Status is: Inpatient   Remains inpatient appropriate because: Continuous need for IV fluids and medications.       Consultants:  General surgery   Procedures:  See below  Antimicrobials:  Anti-infectives (From admission, onward)    Start     Dose/Rate Route Frequency Ordered Stop   08/10/21 1000  ciprofloxacin (CIPRO) IVPB 400 mg        400 mg 200 mL/hr over 60 Minutes Intravenous Every 12 hours 08/10/21 0824  08/10/21 0915  metroNIDAZOLE (FLAGYL) IVPB 500 mg        500 mg 100 mL/hr over  60 Minutes Intravenous Every 8 hours 08/10/21 0824        Subjective: Afebrile, reports no chest pain, no further episodes of vomiting and currently no diarrhea.  Increase NG tube output appreciated on examination.  Small amount of liquid stool in patient's ostomy bag.  Objective: Vitals:   08/18/21 1600 08/18/21 1615 08/18/21 1621 08/18/21 1630  BP: 99/68 109/69  104/65  Pulse: (!) 117 (!) 112 (!) 113 (!) 117  Resp: (!) 23 (!) 32 (!) 29 (!) 21  Temp:   98.1 F (36.7 C)   TempSrc:   Oral   SpO2: 99% 99% 99% 99%  Weight:      Height:        Intake/Output Summary (Last 24 hours) at 08/18/2021 1730 Last data filed at 08/18/2021 1200 Gross per 24 hour  Intake 2438.84 ml  Output 2670 ml  Net -231.16 ml   Filed Weights   08/16/21 0401 08/17/21 0545 08/18/21 0500  Weight: 86.1 kg 85.6 kg 84.7 kg    Examination: General exam: Alert, awake, oriented x 3, with increase NG tube output, reported improvement in abdominal discomfort, no nausea at this moment.  Afebrile. Respiratory system: Positive scattered rhonchi it, no using accessory muscles, no wheezing, no crackles.  Good oxygen saturation on room air. Cardiovascular system: Sinus tachycardia, no rubs, no gallops, no JVD. Gastrointestinal system: Abdomen is soft, ostomy in place, presents of large parastomal hernia that is reducible on examination.  Decreased but present bowel sounds.  A small amount of liquid stool appreciated in ostomy bag.   Central nervous system: Alert and oriented. No focal neurological deficits. Extremities: No cyanosis or clubbing. Skin: No petechiae. Psychiatry: Judgement and insight appear normal. Mood & affect appropriate.    Data Reviewed: I have personally reviewed following labs and imaging studies  CBC: Recent Labs  Lab 08/14/21 0436 08/15/21 0245 08/16/21 0351 08/17/21 0700 08/18/21 0221  WBC 11.7* 12.3* 16.7* 19.2* 20.2*  NEUTROABS  --   --   --   --  16.0*  HGB 10.9* 10.8* 11.1* 12.1  9.6*  HCT 34.0* 34.6* 34.3* 38.5 28.8*  MCV 94.7 95.6 97.2 98.5 94.1  PLT 412* 504* 559* 652* 638*   Basic Metabolic Panel: Recent Labs  Lab 08/13/21 0549 08/14/21 0436 08/15/21 0245 08/17/21 0700 08/18/21 0221  NA 136 136 137 134* 135  K 3.9 4.2 4.1 3.4* 3.7  CL 109 107 107 102 104  CO2 22 21* 21* 19* 21*  GLUCOSE 104* 113* 99 107* 87  BUN <5* <5* <5* 17 19  CREATININE 0.35* 0.39* 0.39* 1.36* 0.92  CALCIUM 8.3* 8.5* 8.4* 8.6* 8.1*  MG 1.6* 1.8 1.9 3.1* 2.8*  PHOS  --   --   --   --  3.7   GFR: Estimated Creatinine Clearance: 68.9 mL/min (by C-G formula based on SCr of 0.92 mg/dL).  Liver Function Tests: Recent Labs  Lab 08/17/21 0700  AST 25  ALT 13  ALKPHOS 112  BILITOT 0.9  PROT 7.2  ALBUMIN 2.7*   Sepsis Labs: No results for input(s): PROCALCITON, LATICACIDVEN in the last 168 hours.   Recent Results (from the past 240 hour(s))  Resp Panel by RT-PCR (Flu A&B, Covid) Nasopharyngeal Swab     Status: None   Collection Time: 08/09/21 12:11 AM   Specimen: Nasopharyngeal Swab; Nasopharyngeal(NP) swabs in vial transport medium  Result Value Ref Range Status   SARS Coronavirus 2 by RT PCR NEGATIVE NEGATIVE Final    Comment: (NOTE) SARS-CoV-2 target nucleic acids are NOT DETECTED.  The SARS-CoV-2 RNA is generally detectable in upper respiratory specimens during the acute phase of infection. The lowest concentration of SARS-CoV-2 viral copies this assay can detect is 138 copies/mL. A negative result does not preclude SARS-Cov-2 infection and should not be used as the sole basis for treatment or other patient management decisions. A negative result may occur with  improper specimen collection/handling, submission of specimen other than nasopharyngeal swab, presence of viral mutation(s) within the areas targeted by this assay, and inadequate number of viral copies(<138 copies/mL). A negative result must be combined with clinical observations, patient history, and  epidemiological information. The expected result is Negative.  Fact Sheet for Patients:  EntrepreneurPulse.com.au  Fact Sheet for Healthcare Providers:  IncredibleEmployment.be  This test is no t yet approved or cleared by the [place] FDA and  has been authorized for detection and/or diagnosis of SARS-CoV-2 by FDA under an Emergency Use Authorization (EUA). This EUA will remain  in effect (meaning this test can be used) for the duration of the COVID-19 declaration under Section 564(b)(1) of the Act, 21 U.S.C.section 360bbb-3(b)(1), unless the authorization is terminated  or revoked sooner.       Influenza A by PCR NEGATIVE NEGATIVE Final   Influenza B by PCR NEGATIVE NEGATIVE Final    Comment: (NOTE) The Xpert Xpress SARS-CoV-2/FLU/RSV plus assay is intended as an aid in the diagnosis of influenza from Nasopharyngeal swab specimens and should not be used as a sole basis for treatment. Nasal washings and aspirates are unacceptable for Xpert Xpress SARS-CoV-2/FLU/RSV testing.  Fact Sheet for Patients: EntrepreneurPulse.com.au  Fact Sheet for Healthcare Providers: IncredibleEmployment.be  This test is not yet approved or cleared by the [place] FDA and has been authorized for detection and/or diagnosis of SARS-CoV-2 by FDA under an Emergency Use Authorization (EUA). This EUA will remain in effect (meaning this test can be used) for the duration of the COVID-19 declaration under Section 564(b)(1) of the Act, 21 U.S.C. section 360bbb-3(b)(1), unless the authorization is terminated or revoked.  Performed at [facility], [address] [place], [place] [address]   MRSA Next Gen by PCR, Nasal     Status: None   Collection Time: 08/10/21  6:46 AM   Specimen: Nasal Mucosa; Nasal Swab  Result Value Ref Range Status   MRSA by PCR Next Gen NOT DETECTED NOT DETECTED Final    Comment: (NOTE) The  GeneXpert MRSA Assay (FDA approved for NASAL specimens only), is one component of a comprehensive MRSA colonization surveillance program. It is not intended to diagnose MRSA infection nor to guide or monitor treatment for MRSA infections. Test performance is not FDA approved in patients less than 35 years old. Performed at [facility], [address] [place], [place] [address]   C Difficile Quick Screen w PCR reflex     Status: None   Collection Time: 08/10/21  8:22 AM   Specimen: STOOL  Result Value Ref Range Status   C Diff antigen NEGATIVE NEGATIVE Final   C Diff toxin NEGATIVE NEGATIVE Final   C Diff interpretation No C. difficile detected.  Final    Comment: Performed at [facility], [address] [place], [place] [address]  Gastrointestinal Panel by PCR , Stool     Status: None   Collection Time: 08/10/21  8:22 AM   Specimen:  STOOL  Result Value Ref Range Status   Campylobacter species NOT DETECTED NOT DETECTED Final   Plesimonas shigelloides NOT DETECTED NOT DETECTED Final   Salmonella species NOT DETECTED NOT DETECTED Final   Yersinia enterocolitica NOT DETECTED NOT DETECTED Final   Vibrio species NOT DETECTED NOT DETECTED Final   Vibrio cholerae NOT DETECTED NOT DETECTED Final   Enteroaggregative E coli (EAEC) NOT DETECTED NOT DETECTED Final   Enteropathogenic E coli (EPEC) NOT DETECTED NOT DETECTED Final   Enterotoxigenic E coli (ETEC) NOT DETECTED NOT DETECTED Final   Shiga like toxin producing E coli (STEC) NOT DETECTED NOT DETECTED Final   Shigella/Enteroinvasive E coli (EIEC) NOT DETECTED NOT DETECTED Final   Cryptosporidium NOT DETECTED NOT DETECTED Final   Cyclospora cayetanensis NOT DETECTED NOT DETECTED Final   Entamoeba histolytica NOT DETECTED NOT DETECTED Final   Giardia lamblia NOT DETECTED NOT DETECTED Final   Adenovirus F40/41 NOT DETECTED NOT DETECTED Final   Astrovirus NOT DETECTED NOT DETECTED Final   Norovirus GI/GII NOT DETECTED NOT DETECTED Final    Rotavirus A NOT DETECTED NOT DETECTED Final   Sapovirus (I, II, IV, and V) NOT DETECTED NOT DETECTED Final    Comment: Performed at [facility], [address] [place], [place] [address]  Culture, blood (routine x 2)     Status: None   Collection Time: 08/10/21  2:32 PM   Specimen: Left Antecubital; Blood  Result Value Ref Range Status   Specimen Description LEFT ANTECUBITAL  Final   Special Requests   Final    BOTTLES DRAWN AEROBIC AND ANAEROBIC Blood Culture adequate volume   Culture   Final    NO GROWTH 5 DAYS Performed at [facility], [address], [place], [place] [address]    Report Status 08/15/2021 FINAL  Final  Culture, blood (routine x 2)     Status: None   Collection Time: 08/10/21  2:32 PM   Specimen: BLOOD LEFT HAND  Result Value Ref Range Status   Specimen Description BLOOD LEFT HAND  Final   Special Requests   Final    BOTTLES DRAWN AEROBIC AND ANAEROBIC Blood Culture results may not be optimal due to an excessive volume of blood received in culture bottles   Culture   Final    NO GROWTH 5 DAYS Performed at [facility], [address], [place], [place] [address]    Report Status 08/15/2021 FINAL  Final     Radiology Studies: DG Chest 1 View  Result Date: 08/17/2021 CLINICAL DATA:  Nasogastric tube placement. EXAM: CHEST  1 VIEW COMPARISON:  04/21/2021.  Chest CTA dated 08/12/2021. FINDINGS: Very poor depth of inspiration with no gross change in mild elevation of the right hemidiaphragm. Mild bibasilar atelectasis. Interval small, oval nodular density in the lateral aspect of the mid right lung. No nodule at that location on the recent CTA. Normal sized heart. Unremarkable bones. IMPRESSION: 1. Very poor inspiration with mild bibasilar atelectasis. 2. Interval small nodular density in the lateral aspect of the mid right lung, not present on the CTA 5 days ago. This can be re-evaluated at the time of follow-up radiographs. Electronically Signed   By:  Claudie Revering M.D.   On: 08/17/2021 10:51   DG Abd 1 View  Result Date: 08/18/2021 CLINICAL DATA:  Large parastomal hernia.  Recurrent colitis. EXAM: ABDOMEN - 1 VIEW COMPARISON:  None. FINDINGS: Severe gaseous distension of the stomach. Insert gastric tube with the tip projecting over the stomach. Gaseous  distension of the small bowel. No bowel dilatation to suggest obstruction. No evidence of pneumoperitoneum, portal venous gas or pneumatosis. No pathologic calcifications along the expected course of the ureters. No acute osseous abnormality. IMPRESSION: 1. Severe gaseous distension of the stomach with a nasogastric tube in place in the stomach. Gaseous distension of the small bowel. Overall findings concerning for persistent small bowel obstruction. Electronically Signed   By: Kathreen Devoid M.D.   On: 08/18/2021 10:03   DG Abd 1 View  Result Date: 08/17/2021 CLINICAL DATA:  Encounter for NG tube placement. Pt admitted for SBO, c/o SOB. Hx of HTN, pt has colostomy. EXAM: ABDOMEN - 1 VIEW COMPARISON:  CT abdomen pelvis 08/16/2021 FINDINGS: Enteric tube coursing below the hemidiaphragm with tip and side port overlying the gastric lumen. The stomach is dilated with gas. No radio-opaque calculi or other significant radiographic abnormality are seen. IMPRESSION: 1. Enteric tube in good position. 2. Obstructive bowel gas pattern again noted. Electronically Signed   By: Iven Finn M.D.   On: 08/17/2021 23:52   US Venous Img Upper Uni Right(DVT)  Result Date: 08/17/2021 CLINICAL DATA:  51 year old female with elevated D-dimer, concern for right upper extremity deep vein thrombosis. EXAM: RIGHT UPPER EXTREMITY VENOUS DOPPLER ULTRASOUND TECHNIQUE: Gray-scale sonography with graded compression, as well as color Doppler and duplex ultrasound were performed to evaluate the upper extremity deep venous system from the level of the subclavian vein and including the jugular, axillary, basilic, radial, ulnar and upper  cephalic vein. Spectral Doppler was utilized to evaluate flow at rest and with distal augmentation maneuvers. COMPARISON:  None. FINDINGS: Contralateral Subclavian Vein: Respiratory phasicity is normal and symmetric with the symptomatic side. No evidence of thrombus. Normal compressibility. Internal Jugular Vein: No evidence of thrombus. Normal compressibility, respiratory phasicity and response to augmentation. Subclavian Vein: No evidence of thrombus. Normal compressibility, respiratory phasicity and response to augmentation. Axillary Vein: No evidence of thrombus. Normal compressibility, respiratory phasicity and response to augmentation. Cephalic Vein: No evidence of thrombus. Normal compressibility, respiratory phasicity and response to augmentation. Basilic Vein: No evidence of thrombus. Normal compressibility, respiratory phasicity and response to augmentation. Brachial Veins: No evidence of thrombus. Normal compressibility, respiratory phasicity and response to augmentation. Radial Veins: No evidence of thrombus. Normal compressibility, respiratory phasicity and response to augmentation. Ulnar Veins: No evidence of thrombus. Normal compressibility, respiratory phasicity and response to augmentation. Other Findings:  None visualized. IMPRESSION: No evidence of deep vein thrombosis within the right upper extremity. Ruthann Cancer, MD [facility] Radiology Electronically Signed   By: Ruthann Cancer M.D.   On: 08/17/2021 07:38   DG CHEST PORT 1 VIEW  Result Date: 08/17/2021 CLINICAL DATA:  NG tube placement. EXAM: PORTABLE CHEST 1 VIEW COMPARISON:  Chest x-ray 08/17/2021. FINDINGS: Enteric tube tip extends into the stomach, distal tip is not included, but is likely beyond the mid stomach. There is gastric dilatation as seen on prior CT. There is left basilar atelectasis. Right-sided central venous catheter tip projects over the distal SVC. Cardiomediastinal  silhouette is unchanged, the heart is enlarged. There is no pneumothorax or acute fracture. IMPRESSION: 1. NG tube reaches the mid stomach. The distal tip is not included on this image. 2. Stable dilated stomach. Electronically Signed   By: Ronney Asters M.D.   On: 08/17/2021 23:44   [place] EKG SITE RITE  Result Date: 08/17/2021 If Site Rite image not attached, placement could not be confirmed due to current cardiac rhythm.  Scheduled Meds:  chlorhexidine  15 mL Mouth Rinse BID   Chlorhexidine Gluconate Cloth  6 each Topical Daily   heparin  5,000 Units Subcutaneous Q8H   [START ON 08/19/2021] insulin aspart  0-15 Units Subcutaneous Q6H   mouth rinse  15 mL Mouth Rinse q12n4p   metoprolol tartrate  2.5 mg Intravenous Q6H   multivitamin with minerals  1 tablet Oral Daily   pantoprazole (PROTONIX) IV  40 mg Intravenous Q12H   sodium chloride flush  10-40 mL Intracatheter Q12H   Continuous Infusions:  ciprofloxacin Stopped (08/18/21 0543)   lactated ringers 100 mL/hr at 08/18/21 0700   lactated ringers     methocarbamol (ROBAXIN) IV 500 mg (08/18/21 0700)   metronidazole 500 mg (08/18/21 1012)   norepinephrine (LEVOPHED) Adult infusion 1 mcg/min (08/18/21 1252)   TPN ADULT (ION)       LOS: 7 days    Barton Dubois, MD [facility]  If 7PM-7AM, please contact night-coverage [email or web address] 08/18/2021, 5:30 PM

## 2021-08-19 ENCOUNTER — Inpatient Hospital Stay (HOSPITAL_COMMUNITY): Payer: Self-pay

## 2021-08-19 LAB — COMPREHENSIVE METABOLIC PANEL
ALT: 10 U/L (ref 0–44)
AST: 17 U/L (ref 15–41)
Albumin: 1.9 g/dL — ABNORMAL LOW (ref 3.5–5.0)
Alkaline Phosphatase: 82 U/L (ref 38–126)
Anion gap: 7 (ref 5–15)
BUN: 13 mg/dL (ref 6–20)
CO2: 25 mmol/L (ref 22–32)
Calcium: 7.7 mg/dL — ABNORMAL LOW (ref 8.9–10.3)
Chloride: 107 mmol/L (ref 98–111)
Creatinine, Ser: 0.56 mg/dL (ref 0.44–1.00)
GFR, Estimated: >60 mL/min (ref >60)
Glucose, Bld: 110 mg/dL — ABNORMAL HIGH (ref 70–99)
Potassium: 2.7 mmol/L — CL (ref 3.5–5.1)
Sodium: 139 mmol/L (ref 135–145)
Total Bilirubin: 0.8 mg/dL (ref 0.3–1.2)
Total Protein: 5.5 g/dL — ABNORMAL LOW (ref 6.5–8.1)

## 2021-08-19 LAB — MAGNESIUM: Magnesium: 2.4 mg/dL (ref 1.7–2.4)

## 2021-08-19 LAB — TRIGLYCERIDES: Triglycerides: 59 mg/dL (ref <150)

## 2021-08-19 LAB — GLUCOSE, CAPILLARY
Glucose-Capillary: 101 mg/dL — ABNORMAL HIGH (ref 70–99)
Glucose-Capillary: 109 mg/dL — ABNORMAL HIGH (ref 70–99)
Glucose-Capillary: 122 mg/dL — ABNORMAL HIGH (ref 70–99)
Glucose-Capillary: 123 mg/dL — ABNORMAL HIGH (ref 70–99)
Glucose-Capillary: 146 mg/dL — ABNORMAL HIGH (ref 70–99)

## 2021-08-19 LAB — PHOSPHORUS: Phosphorus: 3.4 mg/dL (ref 2.5–4.6)

## 2021-08-19 MED ORDER — TRAVASOL 10 % IV SOLN
INTRAVENOUS | Status: AC
  Filled 2021-08-19: qty 921.6

## 2021-08-19 MED ORDER — METRONIDAZOLE 500 MG/100ML IV SOLN
500.0000 mg | Freq: Three times a day (TID) | INTRAVENOUS | Status: AC
  Administered 2021-08-19: 500 mg via INTRAVENOUS
  Filled 2021-08-19: qty 100

## 2021-08-19 MED ORDER — MORPHINE SULFATE (PF) 2 MG/ML IV SOLN
2.0000 mg | INTRAVENOUS | Status: DC | PRN
  Administered 2021-08-20 – 2021-08-26 (×16): 2 mg via INTRAVENOUS
  Filled 2021-08-19 (×17): qty 1

## 2021-08-19 MED ORDER — POTASSIUM CHLORIDE 10 MEQ/100ML IV SOLN
INTRAVENOUS | Status: AC
  Administered 2021-08-19: 10 meq via INTRAVENOUS
  Filled 2021-08-19: qty 100

## 2021-08-19 MED ORDER — LACTATED RINGERS IV SOLN: INTRAVENOUS | Status: AC

## 2021-08-19 MED ORDER — POTASSIUM CHLORIDE 10 MEQ/100ML IV SOLN
10.0000 meq | INTRAVENOUS | Status: AC
  Administered 2021-08-19: 10 meq via INTRAVENOUS
  Filled 2021-08-19 (×2): qty 100

## 2021-08-19 MED ORDER — POTASSIUM CHLORIDE 10 MEQ/100ML IV SOLN
10.0000 meq | INTRAVENOUS | Status: AC
  Administered 2021-08-19 (×4): 10 meq via INTRAVENOUS
  Filled 2021-08-19 (×3): qty 100

## 2021-08-19 MED ORDER — ALBUTEROL SULFATE (2.5 MG/3ML) 0.083% IN NEBU
2.5000 mg | INHALATION_SOLUTION | RESPIRATORY_TRACT | Status: DC | PRN
  Administered 2021-08-19: 09:00:00 2.5 mg via RESPIRATORY_TRACT
  Filled 2021-08-19: qty 3

## 2021-08-19 MED ORDER — CIPROFLOXACIN IN D5W 400 MG/200ML IV SOLN
400.0000 mg | Freq: Two times a day (BID) | INTRAVENOUS | Status: AC
  Administered 2021-08-19: 400 mg via INTRAVENOUS
  Filled 2021-08-19: qty 200

## 2021-08-19 MED ORDER — LORAZEPAM 2 MG/ML IJ SOLN
0.5000 mg | Freq: Every evening | INTRAMUSCULAR | Status: DC | PRN
  Administered 2021-08-23 – 2021-08-27 (×3): 0.5 mg via INTRAVENOUS
  Filled 2021-08-19 (×3): qty 1

## 2021-08-19 NOTE — Progress Notes (Signed)
[facility] Progress Note     Subjective: NG output continues, had some ostomy output too. NG replaced just now because was retracted back. Patient says the NG feels better and her abdomen is better.   On reviewing her chart an talking to her, she fell in her bathroom 12/2019 was admitted with encephalopathy thought to be related to alcohol abuse, was found to have the left necrotic buttock wound on day 4 in the hospital and was taken to the OR for two debridements and the laparoscopic diverting colostomy.    Objective: Vital signs in last 24 hours: Temp:  [97.9 F (36.6 C)-98.4 F (36.9 C)] 97.9 F (36.6 C) (02/03 1149) Pulse Rate:  [92-122] 109 (02/03 1300) Resp:  [15-37] 29 (02/03 1300) BP: (88-124)/(55-86) 109/77 (02/03 1300) SpO2:  [87 %-100 %] 98 % (02/03 1300) Weight:  [83.5 kg] 83.5 kg (02/03 0500) Last BM Date: 08/18/21  Intake/Output from previous day: 02/02 0701 - 02/03 0700 In: 1247.4 [I.V.:1135.6; IV Piggyback:111.8] Out: 5820 [Urine:220; Emesis/NG output:4600; Stool:1000] Intake/Output this shift: Total I/O In: 2444.7 [P.O.:240; I.V.:1180.6; IV Piggyback:1024.1] Out: 950 [Emesis/NG output:900; Stool:50]  General appearance: alert and no distress Resp: normal work of breathing GI: soft, distended around osotmy, parastomal hernia, reduces easily, nontender, ostomy pink with output  Lab Results:  Recent Labs    08/17/21 0700 08/18/21 0221  WBC 19.2* 20.2*  HGB 12.1 9.6*  HCT 38.5 28.8*  PLT 652* 607*   BMET Recent Labs    08/18/21 0221 08/19/21 0405  NA 135 139  K 3.7 2.7*  CL 104 107  CO2 21* 25  GLUCOSE 87 110*  BUN 19 13  CREATININE 0.92 0.56  CALCIUM 8.1* 7.7*   PT/INR No results for input(s): LABPROT, INR in the last 72 hours.  Studies/Results: DG Abd 1 View  Result Date: 08/18/2021 CLINICAL DATA:  Large parastomal hernia.  Recurrent colitis. EXAM: ABDOMEN - 1 VIEW COMPARISON:  None. FINDINGS: Severe gaseous distension  of the stomach. Insert gastric tube with the tip projecting over the stomach. Gaseous distension of the small bowel. No bowel dilatation to suggest obstruction. No evidence of pneumoperitoneum, portal venous gas or pneumatosis. No pathologic calcifications along the expected course of the ureters. No acute osseous abnormality. IMPRESSION: 1. Severe gaseous distension of the stomach with a nasogastric tube in place in the stomach. Gaseous distension of the small bowel. Overall findings concerning for persistent small bowel obstruction. Electronically Signed   By: Kathreen Devoid M.D.   On: 08/18/2021 10:03   DG Abd 1 View  Result Date: 08/17/2021 CLINICAL DATA:  Encounter for NG tube placement. Pt admitted for SBO, c/o SOB. Hx of HTN, pt has colostomy. EXAM: ABDOMEN - 1 VIEW COMPARISON:  CT abdomen pelvis 08/16/2021 FINDINGS: Enteric tube coursing below the hemidiaphragm with tip and side port overlying the gastric lumen. The stomach is dilated with gas. No radio-opaque calculi or other significant radiographic abnormality are seen. IMPRESSION: 1. Enteric tube in good position. 2. Obstructive bowel gas pattern again noted. Electronically Signed   By: Iven Finn M.D.   On: 08/17/2021 23:52   DG CHEST PORT 1 VIEW  Result Date: 08/17/2021 CLINICAL DATA:  NG tube placement. EXAM: PORTABLE CHEST 1 VIEW COMPARISON:  Chest x-ray 08/17/2021. FINDINGS: Enteric tube tip extends into the stomach, distal tip is not included, but is likely beyond the mid stomach. There is gastric dilatation as seen on prior CT. There is left basilar atelectasis. Right-sided central venous catheter tip projects  over the distal SVC. Cardiomediastinal silhouette is unchanged, the heart is enlarged. There is no pneumothorax or acute fracture. IMPRESSION: 1. NG tube reaches the mid stomach. The distal tip is not included on this image. 2. Stable dilated stomach. Electronically Signed   By: Ronney Asters M.D.   On: 08/17/2021 23:44   DG Abd  Portable 1V  Result Date: 08/19/2021 CLINICAL DATA:  OG tube placement EXAM: PORTABLE ABDOMEN - 1 VIEW COMPARISON:  Plain film of the abdomen dated 08/18/2021. CT abdomen dated 08/16/2021. FINDINGS: The enteric tube is no longer visualized, presumably retracted to the level of the upper chest or beyond. Continued evidence of small-bowel obstruction with dilated gas-filled small bowel loops throughout the central abdomen. No evidence of free intraperitoneal air. IMPRESSION: 1. The enteric tube is no longer visualized, presumably retracted to the level of the upper chest or beyond the upper chest. Recommend repositioning into the stomach. 2. Continued evidence of small-bowel obstruction. Electronically Signed   By: Franki Cabot M.D.   On: 08/19/2021 13:08    Anti-infectives: Anti-infectives (From admission, onward)    Start     Dose/Rate Route Frequency Ordered Stop   08/19/21 1800  metroNIDAZOLE (FLAGYL) IVPB 500 mg        500 mg 100 mL/hr over 60 Minutes Intravenous Every 8 hours 08/19/21 1117 08/20/21 0159   08/19/21 1700  ciprofloxacin (CIPRO) IVPB 400 mg        400 mg 200 mL/hr over 60 Minutes Intravenous Every 12 hours 08/19/21 1117 08/20/21 0459   08/10/21 1000  ciprofloxacin (CIPRO) IVPB 400 mg  Status:  Discontinued        400 mg 200 mL/hr over 60 Minutes Intravenous Every 12 hours 08/10/21 0824 08/19/21 1117   08/10/21 0915  metroNIDAZOLE (FLAGYL) IVPB 500 mg  Status:  Discontinued        500 mg 100 mL/hr over 60 Minutes Intravenous Every 8 hours 08/10/21 0824 08/19/21 1117       Assessment/Plan: Patient with SBO in the setting of parastomal hernia, recent colitis and high output. NG with high output now. NG working. Exam is reassuring.   Will continue conservative management to try to resolve the SBO as the parastomal hernia reduces and the ostomy is making some contents.  High risk for any reversal type surgery given her nutritional status, Albumin 1.9, recent colitis and  overall deconditioning   If had to get surgery, reducing the SB and trying to close the parastomal defect would likely result in a recurrent parastomal hernia and additional scar tissue.   Better option for the patient would be to get over this SBO and improve her nutritional status, to ultimately get the colostomy reversed and get the abdominal wall closed   [facility]S is aware that the patient is here and agree with the plan.   Replace lytes, NPO, NG  Updated Dontray, on nutritional status and concern above. He is understanding.   Greater than 50% of the 35 minute visit was spent in counseling/ coordination of care regarding her colostomy and her SBO and concern for any surgery. If it comes to it, she may need surgery to fix the SBO if it does not improve. Will give a few more days.    LOS: 8 days    Virl Cagey 08/19/2021

## 2021-08-19 NOTE — Progress Notes (Signed)
PROGRESS NOTE    Alyssa Carey  ERD:[id number] DOB: [birth year] DOA: 08/09/2021 PCP: Caryl Bis, MD   Brief Narrative:   Alyssa Carey  is a 51 y.o. 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  Patient was admitted with pancolitis and associated dehydration as well as electrolyte abnormalities with hypokalemia and hypomagnesemia in the setting of ongoing high output/diarrhea.  She has been started on ciprofloxacin and Flagyl empirically on 1/25 and GI pathogen panel as well as C. difficile testing has been ordered which have now returned negative.  General surgery had initially evaluated patient on account of the parastomal hernia, but patient was improving and therefore they had signed off.  She now is having some persistent nausea and vomiting along with abdominal pain and repeat CT imaging on 1/31 demonstrates high-grade bowel obstruction findings for which general surgery has been reconsulted and has recommended patient to be n.p.o. and to place NG tube.    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 -GI pathogen panel negative and C. difficile testing negative -No recent use of antibiotics -Continue supportive care.  New findings of high-grade small bowel obstruction in the setting of parastomal hernia -General surgery recommended n.p.o. status, NG tube and follow clinical response.  Patient facing the need of surgical intervention; but is stable to continue conservative management for now. -Will continue TPN  for nutrition purposes.  Dehydration secondary to above -With noted high ostomy output at time of admission, now decreasing -Continue IV fluid resuscitation. -Continue to follow clinical response and provide support.   Sinus tachycardia-ongoing -Likely related to the dehydration as well as persistent electrolyte abnormalities -No sign of anxiety noted -TSH 0.6 -D-dimer noted to be 5.71, will CT angiogram negative for PE -Check urine tox and echo 1/29 -Started on metoprolol 1/30 after discussion with cardiology continue adjusted dose of metoprolol 25 mg twice a day currently IV as patient unable to tolerate p.o.   Moderate protein calorie malnutrition -Currently n.p.o. -NG tube will be placed follow recommendation by general surgery -Continue TPN management. -Follow clinical response and continue supportive care.   FOBT positive -Continue to monitor hemoglobin trend; no overt bleeding appreciated. -Continue treatment for PPI as mentioned below.   History of PUD/alcoholic gastritis -Continue IV PPI   Alcoholic liver disease -Continue CIWA protocol -No active withdrawal symptoms appreciated. -Cessation counseling provided.  Leukocytosis -In the setting of pancolitis (and chemo concentration from severe dehydration. -Continue current antibiotic therapy and fluid resuscitation -Continue to follow WBCs trend.  Hypokalemia -In the setting of GI losses and NG tube -Continue to follow electrolytes and further replete as needed. -Patient also receiving TPN at this time.     DVT prophylaxis: Heparin Code Status: Full Family Communication: None at bedside Disposition Plan:  Status is: Inpatient   Remains inpatient appropriate because: Continuous need for IV fluids and medications.       Consultants:  General surgery   Procedures:  See below  Antimicrobials:  Anti-infectives (From admission, onward)    Start     Dose/Rate Route  Frequency Ordered Stop   08/19/21 1800   metroNIDAZOLE (FLAGYL) IVPB 500 mg        500 mg 100 mL/hr over 60 Minutes Intravenous Every 8 hours 08/19/21 1117 08/20/21 0159   08/19/21 1700  ciprofloxacin (CIPRO) IVPB 400 mg        400 mg 200 mL/hr over 60 Minutes Intravenous Every 12 hours 08/19/21 1117 08/20/21 0459   08/10/21 1000  ciprofloxacin (CIPRO) IVPB 400 mg  Status:  Discontinued        400 mg 200 mL/hr over 60 Minutes Intravenous Every 12 hours 08/10/21 0824 08/19/21 1117   08/10/21 0915  metroNIDAZOLE (FLAGYL) IVPB 500 mg  Status:  Discontinued        500 mg 100 mL/hr over 60 Minutes Intravenous Every 8 hours 08/10/21 0824 08/19/21 1117      Subjective: Overall feeling better, denies chest pain, no nausea or vomiting.  Complaining of sore throat from NG tube.  Expressed significant improvement in her abdominal discomfort.  No shortness of breath and good oxygen saturation appreciated on exam.  Objective: Vitals:   08/19/21 1200 08/19/21 1300 08/19/21 1400 08/19/21 1500  BP: 106/68 109/77 113/71 114/71  Pulse: (!) 105 (!) 109 (!) 103 (!) 103  Resp: (!) 27 (!) 29 (!) 22 (!) 21  Temp:      TempSrc:      SpO2: 96% 98% 100% 97%  Weight:      Height:        Intake/Output Summary (Last 24 hours) at 08/19/2021 1600 Last data filed at 08/19/2021 1515 Gross per 24 hour  Intake 4162.17 ml  Output 4770 ml  Net -607.83 ml   Filed Weights   08/17/21 0545 08/18/21 0500 08/19/21 0500  Weight: 85.6 kg 84.7 kg 83.5 kg    Examination: General exam: Alert, awake, oriented x 3, reports no nausea or vomiting; complaining of sore throat due to NG tube.  Abdomen with left discomfort. Respiratory system: Good air movement bilaterally, positive scattered rhonchi it, no using accessory muscles.  Good saturation on room air. Cardiovascular system: Mild sinus tachycardia no murmurs, rubs, gallops.  No JVD. Gastrointestinal system: Abdomen is nondistended, soft and without guarding.  Positive large parastomal hernia appreciated on  examination (reducible); positive bowel sounds on exam.  Liquid fecal material appreciated in ostomy bag.   Central nervous system: Alert and oriented. No focal neurological deficits. Extremities: No cyanosis or clubbing. Skin: No petechiae. Psychiatry: Judgement and insight appear normal. Mood & affect appropriate.   Data Reviewed: I have personally reviewed following labs and imaging studies  CBC: Recent Labs  Lab 08/14/21 0436 08/15/21 0245 08/16/21 0351 08/17/21 0700 08/18/21 0221  WBC 11.7* 12.3* 16.7* 19.2* 20.2*  NEUTROABS  --   --   --   --  16.0*  HGB 10.9* 10.8* 11.1* 12.1 9.6*  HCT 34.0* 34.6* 34.3* 38.5 28.8*  MCV 94.7 95.6 97.2 98.5 94.1  PLT 412* 504* 559* 652* 063*   Basic Metabolic Panel: Recent Labs  Lab 08/14/21 0436 08/15/21 0245 08/17/21 0700 08/18/21 0221 08/19/21 0405  NA 136 137 134* 135 139  K 4.2 4.1 3.4* 3.7 2.7*  CL 107 107 102 104 107  CO2 21* 21* 19* 21* 25  GLUCOSE 113* 99 107* 87 110*  BUN <5* <5* 17 19 13   CREATININE 0.39* 0.39* 1.36* 0.92 0.56  CALCIUM 8.5* 8.4* 8.6* 8.1* 7.7*  MG 1.8 1.9 3.1* 2.8* 2.4  PHOS  --   --   --  3.7 3.4   GFR: Estimated Creatinine Clearance: 78.6 mL/min (by C-G formula based on SCr of 0.56 mg/dL).  Liver Function Tests: Recent Labs  Lab 08/17/21 0700 08/19/21 0405  AST 25 17  ALT 13 10  ALKPHOS 112 82  BILITOT 0.9 0.8  PROT 7.2 5.5*  ALBUMIN 2.7* 1.9*   Sepsis Labs: No results for input(s): PROCALCITON, LATICACIDVEN in the last 168 hours.   Recent Results (from the past 240 hour(s))  MRSA Next Gen by PCR, Nasal     Status: None   Collection Time: 08/10/21  6:46 AM   Specimen: Nasal Mucosa; Nasal Swab  Result Value Ref Range Status   MRSA by PCR Next Gen NOT DETECTED NOT DETECTED Final    Comment: (NOTE) The GeneXpert MRSA Assay (FDA approved for NASAL specimens only), is one component of a comprehensive MRSA colonization surveillance program. It is not intended to diagnose MRSA infection  nor to guide or monitor treatment for MRSA infections. Test performance is not FDA approved in patients less than 27 years old. Performed at [facility], [address] [place], [place] [address]   C Difficile Quick Screen w PCR reflex     Status: None   Collection Time: 08/10/21  8:22 AM   Specimen: STOOL  Result Value Ref Range Status   C Diff antigen NEGATIVE NEGATIVE Final   C Diff toxin NEGATIVE NEGATIVE Final   C Diff interpretation No C. difficile detected.  Final    Comment: Performed at [facility], [address], [place], [place] [address]  Gastrointestinal Panel by PCR , Stool     Status: None   Collection Time: 08/10/21  8:22 AM   Specimen: STOOL  Result Value Ref Range Status   Campylobacter species NOT DETECTED NOT DETECTED Final   Plesimonas shigelloides NOT DETECTED NOT DETECTED Final   Salmonella species NOT DETECTED NOT DETECTED Final   Yersinia enterocolitica NOT DETECTED NOT DETECTED Final   Vibrio species NOT DETECTED NOT DETECTED Final   Vibrio cholerae NOT DETECTED NOT DETECTED Final   Enteroaggregative E coli (EAEC) NOT DETECTED NOT DETECTED Final   Enteropathogenic E coli (EPEC) NOT DETECTED NOT DETECTED Final   Enterotoxigenic E coli (ETEC) NOT DETECTED NOT DETECTED Final   Shiga like toxin producing E coli (STEC) NOT DETECTED NOT DETECTED Final   Shigella/Enteroinvasive E coli (EIEC) NOT DETECTED NOT DETECTED Final   Cryptosporidium NOT DETECTED NOT DETECTED Final   Cyclospora cayetanensis NOT DETECTED NOT DETECTED Final   Entamoeba histolytica NOT DETECTED NOT DETECTED Final   Giardia lamblia NOT DETECTED NOT DETECTED Final   Adenovirus F40/41 NOT DETECTED NOT DETECTED Final   Astrovirus NOT DETECTED NOT DETECTED Final   Norovirus GI/GII NOT DETECTED NOT DETECTED Final   Rotavirus A NOT DETECTED NOT DETECTED Final   Sapovirus (I, II, IV, and V) NOT DETECTED NOT DETECTED Final    Comment: Performed at [facility], [address]  [place], [place] [address]  Culture, blood (routine x 2)     Status: None   Collection Time: 08/10/21  2:32 PM   Specimen: Left Antecubital; Blood  Result Value Ref Range Status   Specimen Description LEFT ANTECUBITAL  Final   Special Requests   Final    BOTTLES DRAWN AEROBIC AND ANAEROBIC Blood Culture adequate volume   Culture   Final    NO GROWTH 5 DAYS Performed at [facility], [address] [place],  [address]    Report Status 08/15/2021 FINAL  Final  Culture, blood (routine x 2)     Status: None   Collection Time: 08/10/21  2:32 PM   Specimen: BLOOD LEFT HAND  Result Value Ref Range Status   Specimen Description BLOOD LEFT HAND  Final   Special Requests   Final    BOTTLES DRAWN AEROBIC AND ANAEROBIC Blood Culture results may not be optimal due to an excessive volume of blood received in culture bottles   Culture   Final    NO GROWTH 5 DAYS Performed at [facility], [address] [place] [address], [place], [place] [address]    Report Status 08/15/2021 FINAL  Final     Radiology Studies: DG Abd 1 View  Result Date: 08/18/2021 CLINICAL DATA:  Large parastomal hernia.  Recurrent colitis. EXAM: ABDOMEN - 1 VIEW COMPARISON:  None. FINDINGS: Severe gaseous distension of the stomach. Insert gastric tube with the tip projecting over the stomach. Gaseous distension of the small bowel. No bowel dilatation to suggest obstruction. No evidence of pneumoperitoneum, portal venous gas or pneumatosis. No pathologic calcifications along the expected course of the ureters. No acute osseous abnormality. IMPRESSION: 1. Severe gaseous distension of the stomach with a nasogastric tube in place in the stomach. Gaseous distension of the small bowel. Overall findings concerning for persistent small bowel obstruction. Electronically Signed   By: Kathreen Devoid M.D.   On: 08/18/2021 10:03   DG Abd 1 View  Result Date: 08/17/2021 CLINICAL DATA:  Encounter for NG tube placement. Pt admitted for SBO, c/o SOB. Hx of HTN, pt  has colostomy. EXAM: ABDOMEN - 1 VIEW COMPARISON:  CT abdomen pelvis 08/16/2021 FINDINGS: Enteric tube coursing below the hemidiaphragm with tip and side port overlying the gastric lumen. The stomach is dilated with gas. No radio-opaque calculi or other significant radiographic abnormality are seen. IMPRESSION: 1. Enteric tube in good position. 2. Obstructive bowel gas pattern again noted. Electronically Signed   By: Iven Finn M.D.   On: 08/17/2021 23:52   DG CHEST PORT 1 VIEW  Result Date: 08/17/2021 CLINICAL DATA:  NG tube placement. EXAM: PORTABLE CHEST 1 VIEW COMPARISON:  Chest x-ray 08/17/2021. FINDINGS: Enteric tube tip extends into the stomach, distal tip is not included, but is likely beyond the mid stomach. There is gastric dilatation as seen on prior CT. There is left basilar atelectasis. Right-sided central venous catheter tip projects over the distal SVC. Cardiomediastinal silhouette is unchanged, the heart is enlarged. There is no pneumothorax or acute fracture. IMPRESSION: 1. NG tube reaches the mid stomach. The distal tip is not included on this image. 2. Stable dilated stomach. Electronically Signed   By: Ronney Asters M.D.   On: 08/17/2021 23:44   DG Abd Portable 1V  Result Date: 08/19/2021 CLINICAL DATA:  OG tube placement EXAM: PORTABLE ABDOMEN - 1 VIEW COMPARISON:  Plain film of the abdomen dated 08/18/2021. CT abdomen dated 08/16/2021. FINDINGS: The enteric tube is no longer visualized, presumably retracted to the level of the upper chest or beyond. Continued evidence of small-bowel obstruction with dilated gas-filled small bowel loops throughout the central abdomen. No evidence of free intraperitoneal air. IMPRESSION: 1. The enteric tube is no longer visualized, presumably retracted to the level of the upper chest or beyond the upper chest. Recommend repositioning into the stomach. 2. Continued evidence of small-bowel obstruction. Electronically Signed   By: Franki Cabot M.D.   On:  08/19/2021 13:08     Scheduled Meds:  chlorhexidine  15 mL Mouth Rinse BID   Chlorhexidine  Gluconate Cloth  6 each Topical Daily   heparin  5,000 Units Subcutaneous Q8H   insulin aspart  0-15 Units Subcutaneous Q6H   mouth rinse  15 mL Mouth Rinse q12n4p   melatonin  6 mg Oral QHS   metoprolol tartrate  2.5 mg Intravenous Q6H   pantoprazole (PROTONIX) IV  40 mg Intravenous Q12H   sodium chloride flush  10-40 mL Intracatheter Q12H   Continuous Infusions:  ciprofloxacin     lactated ringers     methocarbamol (ROBAXIN) IV Stopped (08/18/21 0730)   metronidazole     norepinephrine (LEVOPHED) Adult infusion Stopped (08/19/21 0744)   TPN ADULT (ION) 40 mL/hr at 08/19/21 1515   TPN ADULT (ION)       LOS: 8 days    Barton Dubois, MD [facility]  If 7PM-7AM, please contact night-coverage [email or web address] 08/19/2021, 4:00 PM

## 2021-08-19 NOTE — Progress Notes (Signed)
PHARMACY - TOTAL PARENTERAL NUTRITION CONSULT NOTE   Indication: Small bowel obstruction  Patient Measurements: Height: 4' 10"  (147.3 cm) Weight: 83.5 kg (184 lb 1.4 oz) IBW/kg (Calculated) : 40.9 TPN AdjBW (KG): 49 Body mass index is 38.47 kg/m.  Assessment:  51 yo female with medical history significant for large parastomal hernia and recent colitis with high output from her ostomy now with concern for SBO. Repeat CT 01/31 verified partial bowel obstruction. Patient remains NPO. Pharmacy consulted to initiate TPN. patient tolerating TPN, will advance.  Glucose / Insulin:  101-109 Electrolytes: K 2.7 mag 2.4, Ca 7.7, Alb 1.9 WNL for others Renal: Scr 0.56 Hepatic: WNL Intake / Output;  MIVF: LR @ 60 mL/hr > 58m/hr  Central access: PICC 2/1 TPN start date: 2/2  Nutritional Goals: Goal TPN rate is 80 mL/hr (provides 92 g of protein and 1667 kcals per day)  RD Assessment: Estimated Needs Total Energy Estimated Needs: 1500-1700 Total Protein Estimated Needs: 90-99 gr Total Fluid Estimated Needs: >1700 ml daily  Current Nutrition:  NPO  Plan:  Increase TPN to 83mhr at 1800 Electrolytes in TPN: Na 5028mL, K 31m69m, Ca 5mEq26m Mg 5mEq/56mand Phos 15mmol57mCl:Ac 1:1 Add standard MVI and trace elements to TPN Add folic acid and thiamine- discontinue previous orders for them KCL 10meq I33m4 ordered by MD, f/u labs in Am Initiate Moderate q6h SSI and adjust as needed  Reduce MIVF to 60 mL/hr at 1800 Monitor TPN labs on Mon/Thurs,   Sailor Hevia PoIsac Sarnarm D, BCPS Clinical Pharmacist Pager #[phone] 8:08 AM

## 2021-08-19 NOTE — Progress Notes (Signed)
Nutrition Follow-up  DOCUMENTATION CODES:   Non-severe (moderate) malnutrition in context of acute illness/injury, Obesity unspecified  INTERVENTION:   TPN per pahrmacy    NUTRITION DIAGNOSIS:   Moderate Malnutrition related to acute illness (dehydration, necrotizing faciitis, diverting colostomy) as evidenced by energy intake < or equal to 50% for > or equal to 5 days, mild muscle depletion.  -progressing  GOAL:   Patient will meet greater than or equal to 90% of their needs  -pt receiving TPN  MONITOR:   PO intake, Supplement acceptance, Labs, Weight trends, I & O's  REASON FOR ASSESSMENT:   Malnutrition Screening Tool    ASSESSMENT:  Patient is a 51 yo hx of necrotizing fascitis s/p diverting colostomy. Presents with electrolyte disturbance, dehydration.   pSBO with large parastomal hernia, high output through ostomy. Per MD-note pt has been vomiting this morning and distended stomach.  Patient has NG placed and output- 900 ml. NPO due to obstruction. Patient discussed at interdisciplinary rounds and with bedside nursing. Patient is alone in room.   Patient has not been eating at baseline for ~ 3 weeks.  If patient continues to be unable to take nutrition orally recommend consider starting TPN given her prolonged poor oral intake, compromised nutrition status and concern for potential further surgical involvement.  2/2 TPN initiated. No immediate surgery planned (pSBO). Continue NGT and NPO per MD.   2/3 Patient  receiving TPN with Goal rate- 80 mL/hr (provides 1667 kcals, 92 gr protein per day). NG output 4.6 liters.   Weights reviewed.   Intake/Output Summary (Last 24 hours) at 08/19/2021 1049 Last data filed at 08/19/2021 0743 Gross per 24 hour  Intake 3003.56 ml  Output 4670 ml  Net -1666.44 ml   Medications reviewed. Lactated Ringers @ 100 ml/hr Antibiotics- Cipro and Flagyl  Labs: potassium low (being replaced) BMP Latest Ref Rng & Units 08/19/2021  08/18/2021 08/17/2021  Glucose 70 - 99 mg/dL 110(H) 87 107(H)  BUN 6 - 20 mg/dL 13 19 17   Creatinine 0.44 - 1.00 mg/dL 0.56 0.92 1.36(H)  BUN/Creat Ratio 6 - 22 (calc) - - -  Sodium 135 - 145 mmol/L 139 135 134(L)  Potassium 3.5 - 5.1 mmol/L 2.7(LL) 3.7 3.4(L)  Chloride 98 - 111 mmol/L 107 104 102  CO2 22 - 32 mmol/L 25 21(L) 19(L)  Calcium 8.9 - 10.3 mg/dL 7.7(L) 8.1(L) 8.6(L)     Diet Order:   Diet Order             Diet NPO time specified Except for: Ice Chips  Diet effective now                   EDUCATION NEEDS:   Education needs have been addressed  Skin:  Skin Assessment: Reviewed RN Assessment  Last BM:  2/3 output per colostomy 250 and 50 ml  Height:   Ht Readings from Last 1 Encounters:  08/10/21 4' 10"  (1.473 m)    Weight:   Wt Readings from Last 1 Encounters:  08/19/21 83.5 kg    Ideal Body Weight:   45 kg  BMI:  Body mass index is 38.47 kg/m.  Estimated Nutritional Needs:   Kcal:  1500-1700  Protein:  90-99 gr  Fluid:  >1700 ml daily   Colman Cater MS,RD,CSG,LDN Contact: Shea Evans

## 2021-08-19 NOTE — Plan of Care (Signed)
°  Problem: Education: Goal: Knowledge of General Education information will improve Description: Including pain rating scale, medication(s)/side effects and non-pharmacologic comfort measures Outcome: Progressing   Problem: Clinical Measurements: Goal: Will remain free from infection Outcome: Progressing Goal: Diagnostic test results will improve Outcome: Progressing

## 2021-08-20 LAB — CBC
HCT: 26.9 % — ABNORMAL LOW (ref 36.0–46.0)
Hemoglobin: 8.3 g/dL — ABNORMAL LOW (ref 12.0–15.0)
MCH: 30.9 pg (ref 26.0–34.0)
MCHC: 30.9 g/dL (ref 30.0–36.0)
MCV: 100 fL (ref 80.0–100.0)
Platelets: 515 10*3/uL — ABNORMAL HIGH (ref 150–400)
RBC: 2.69 MIL/uL — ABNORMAL LOW (ref 3.87–5.11)
RDW: 17.7 % — ABNORMAL HIGH (ref 11.5–15.5)
WBC: 9.9 10*3/uL (ref 4.0–10.5)
nRBC: 0 % (ref 0.0–0.2)

## 2021-08-20 LAB — BASIC METABOLIC PANEL
Anion gap: 7 (ref 5–15)
Anion gap: 4 — ABNORMAL LOW (ref 5–15)
BUN: 7 mg/dL (ref 6–20)
BUN: 7 mg/dL (ref 6–20)
CO2: 24 mmol/L (ref 22–32)
CO2: 28 mmol/L (ref 22–32)
Calcium: 7.9 mg/dL — ABNORMAL LOW (ref 8.9–10.3)
Calcium: 7.9 mg/dL — ABNORMAL LOW (ref 8.9–10.3)
Chloride: 100 mmol/L (ref 98–111)
Chloride: 107 mmol/L (ref 98–111)
Creatinine, Ser: 0.62 mg/dL (ref 0.44–1.00)
Creatinine, Ser: 0.46 mg/dL (ref 0.44–1.00)
GFR, Estimated: >60 mL/min (ref >60)
GFR, Estimated: >60 mL/min (ref >60)
Glucose, Bld: 965 mg/dL (ref 70–99)
Glucose, Bld: 163 mg/dL — ABNORMAL HIGH (ref 70–99)
Potassium: >7.5 mmol/L (ref 3.5–5.1)
Potassium: 3.2 mmol/L — ABNORMAL LOW (ref 3.5–5.1)
Sodium: 131 mmol/L — ABNORMAL LOW (ref 135–145)
Sodium: 139 mmol/L (ref 135–145)

## 2021-08-20 LAB — GLUCOSE, CAPILLARY
Glucose-Capillary: 142 mg/dL — ABNORMAL HIGH (ref 70–99)
Glucose-Capillary: 147 mg/dL — ABNORMAL HIGH (ref 70–99)
Glucose-Capillary: 148 mg/dL — ABNORMAL HIGH (ref 70–99)
Glucose-Capillary: 149 mg/dL — ABNORMAL HIGH (ref 70–99)
Glucose-Capillary: 164 mg/dL — ABNORMAL HIGH (ref 70–99)

## 2021-08-20 MED ORDER — TRAVASOL 10 % IV SOLN
INTRAVENOUS | Status: AC
  Filled 2021-08-20: qty 921.6

## 2021-08-20 MED ORDER — POTASSIUM CHLORIDE 10 MEQ/100ML IV SOLN
10.0000 meq | INTRAVENOUS | Status: AC
  Administered 2021-08-20 (×4): 10 meq via INTRAVENOUS
  Filled 2021-08-20: qty 100

## 2021-08-20 NOTE — Progress Notes (Signed)
PHARMACY - TOTAL PARENTERAL NUTRITION CONSULT NOTE   Indication: Small bowel obstruction  Patient Measurements: Height: 4' 10"  (147.3 cm) Weight: 83.5 kg (184 lb 1.4 oz) IBW/kg (Calculated) : 40.9 TPN AdjBW (KG): 49 Body mass index is 38.47 kg/m.  Assessment:  51 yo female with medical history significant for large parastomal hernia and recent colitis with high output from her ostomy now with concern for SBO. Repeat CT 01/31 verified partial bowel obstruction. Patient remains NPO. Pharmacy consulted to initiate TPN. patient tolerating TPN, will advance. 2/4 Initial labs drawn off TPN port with suspicious results. Lab redrew with peripheral stick and lab as anticipated. K improving  Glucose / Insulin:  101-109, 8 units insulin Electrolytes: K 3.2  mag 2.4, Ca 7.7, Alb 1.9 WNL for others Renal: Scr 0.56 Hepatic: WNL Intake / Output;  MIVF: LR @ 60 mL/hr > 82m/hr  Central access: PICC 2/1 TPN start date: 2/2  Nutritional Goals: Goal TPN rate is 80 mL/hr (provides 92 g of protein and 1667 kcals per day)  RD Assessment: Estimated Needs Total Energy Estimated Needs: 1500-1700 Total Protein Estimated Needs: 90-99 gr Total Fluid Estimated Needs: >1700 ml daily  Current Nutrition:  NPO  Plan:  Increase TPN to 86mhr at 1800 Electrolytes in TPN: Na 5041mL, K 60m27m, Ca 5mEq46m Mg 5mEq/26mand Phos 15mmol37mCl:Ac 1:1 Add standard MVI and trace elements to TPN Add folic acid and thiamine- discontinue previous orders for them KCL 10meq I71m4 ordered, f/u labs in Am Initiate Moderate q6h SSI and adjust as needed  Reduce MIVF to 20 mL/hr at 1800 Monitor TPN labs on Mon/Thurs,   Lesean Woolverton PoIsac Sarnarm D, BCPS Cli[place]ifornial Pharmacist Pager #[phone] 9:47 AM

## 2021-08-20 NOTE — Progress Notes (Signed)
PROGRESS NOTE    Alyssa Carey  ZOX:[id number] DOB: [birth year] DOA: 08/09/2021 PCP: Caryl Bis, MD   Brief Narrative:   Alyssa Carey  is a 51 y.o. 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  Patient was admitted with pancolitis and associated dehydration as well as electrolyte abnormalities with hypokalemia and hypomagnesemia in the setting of ongoing high output/diarrhea.  She has been started on ciprofloxacin and Flagyl empirically on 1/25 and GI pathogen panel as well as C. difficile testing has been ordered which have now returned negative.  General surgery had initially evaluated patient on account of the parastomal hernia, but patient was improving and therefore they had signed off.  She now is having some persistent nausea and vomiting along with abdominal pain and repeat CT imaging on 1/31 demonstrates high-grade bowel obstruction findings for which general surgery has been reconsulted and has recommended patient to be n.p.o. and to place NG tube.    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 -GI pathogen panel negative and C. difficile testing negative -No recent use of antibiotics -Continue supportive care.  New findings of high-grade small bowel obstruction in the setting of parastomal hernia -General surgery recommended n.p.o. status, NG tube and follow clinical response.  Patient facing the need of surgical intervention; but is stable to continue conservative management for now. -Will continue TPN for  nutrition purposes.  Dehydration secondary to above -With noted high ostomy output at time of admission, now decreasing -Continue IV fluid resuscitation. -Continue to follow clinical response and provide support. -Continue TPN.   Sinus tachycardia -ongoing; but improved.  Currently 110 max HR -Likely related to the dehydration as well as persistent electrolyte abnormalities -No sign of anxiety noted -TSH 0.6 -D-dimer noted to be 5.71, will CT angiogram negative for PE -Check urine tox and echo 1/29 -Started on metoprolol 1/30 after discussion with cardiology continue adjusted dose of metoprolol 25 mg twice a day currently IV as patient unable to tolerate p.o.   Moderate protein calorie malnutrition -Currently n.p.o. -NG tube will be placed follow recommendation by general surgery -Continue TPN management. -Follow clinical response and continue supportive care.   FOBT positive -Continue to monitor hemoglobin trend; no overt bleeding appreciated. -Continue treatment for PPI as mentioned below.   History of PUD/alcoholic gastritis -Continue IV PPI   Alcoholic liver disease -Continue CIWA protocol -No active withdrawal symptoms appreciated. -Cessation counseling provided. -Continue to follow LFTs.  Leukocytosis -In the setting of pancolitis (and chemo concentration from severe dehydration. -Continue current antibiotic therapy and fluid resuscitation -WBCs 9.9; patient remains afebrile. -Continue to follow trend intermittently.  Hypokalemia -In the setting of GI losses and NG tube -Continue to follow electrolytes and further replete as needed. -Patient also receiving TPN at this time. -Potassium 3.2 currently     DVT prophylaxis: Heparin Code Status: Full Family Communication: None at bedside Disposition Plan:  Status is: Inpatient   Remains inpatient appropriate because: Continuous need for IV fluids and medications.       Consultants:  General surgery  Procedures:  See below  Antimicrobials:  Anti-infectives (From admission, onward)    Start     Dose/Rate Route Frequency Ordered Stop   08/19/21 1800  metroNIDAZOLE (FLAGYL) IVPB 500 mg        500 mg 100 mL/hr over 60 Minutes Intravenous Every 8 hours 08/19/21 1117 08/19/21 1900   08/19/21 1700  ciprofloxacin (CIPRO) IVPB 400 mg        400 mg 200 mL/hr over 60 Minutes Intravenous Every 12 hours 08/19/21 1117 08/19/21 1755   08/10/21 1000  ciprofloxacin (CIPRO) IVPB 400 mg  Status:  Discontinued        400 mg 200 mL/hr over 60 Minutes Intravenous Every 12 hours 08/10/21 0824 08/19/21 1117   08/10/21 0915  metroNIDAZOLE (FLAGYL) IVPB 500 mg  Status:  Discontinued        500 mg 100 mL/hr over 60 Minutes Intravenous Every 8 hours 08/10/21 0824 08/19/21 1117      Subjective: No chest pain, no nausea or vomiting.  Complaining of sore throat from NG tube.  Reports abdominal discomfort continues to improve.  Overall feeling better.  Objective: Vitals:   08/19/21 2300 08/20/21 0040 08/20/21 0433 08/20/21 1314  BP:  105/77 105/77 104/82  Pulse: 99 (!) 104 (!) 110 (!) 104  Resp:  20 20 18   Temp:  98.7 F (37.1 C) 98.9 F (37.2 C) 98.9 F (37.2 C)  TempSrc:  Oral Oral Oral  SpO2:  100% 98% 99%  Weight:      Height:        Intake/Output Summary (Last 24 hours) at 08/20/2021 1326 Last data filed at 08/20/2021 0550 Gross per 24 hour  Intake 2180.1 ml  Output 3050 ml  Net -869.9 ml   Filed Weights   08/17/21 0545 08/18/21 0500 08/19/21 0500  Weight: 85.6 kg 84.7 kg 83.5 kg    Examination: General exam: Alert, awake, oriented x 3; patient reports no nausea, no vomiting, continue to have improvement in her abdominal discomfort.  Still having some sore throat from NG tube. Respiratory system: No wheezing, no crackles, no using accessory muscle.  Good saturation on room air. Cardiovascular system: Sinus tachycardia, improved), no rubs, no gallops, no JVD.   Gastrointestinal system:  Abdomen is soft, with positive parastomal hernia that is reducible; positive bowel sounds, ostomy bag with liquid stools inside. No guarding.   Central nervous system: Alert and oriented. No focal neurological deficits. Extremities: No cyanosis or clubbing Skin: No petechiae. Psychiatry: Judgement and insight appear normal. Mood & affect appropriate.    Data Reviewed: I have personally reviewed following labs and imaging studies  CBC: Recent Labs  Lab 08/15/21 0245 08/16/21 0351 08/17/21 0700 08/18/21 0221 08/20/21 0457  WBC 12.3* 16.7* 19.2* 20.2* 9.9  NEUTROABS  --   --   --  16.0*  --   HGB 10.8* 11.1* 12.1 9.6* 8.3*  HCT 34.6* 34.3* 38.5 28.8* 26.9*  MCV 95.6 97.2 98.5 94.1 100.0  PLT 504* 559* 652* 607* 782*   Basic Metabolic Panel: Recent Labs  Lab 08/14/21 0436 08/15/21 0245 08/17/21 0700 08/18/21 0221 08/19/21 0405 08/20/21 0715 08/20/21 0838  NA 136 137 134* 135 139 131* 139  K 4.2 4.1 3.4* 3.7 2.7* >7.5* 3.2*  CL 107 107 102 104 107 100 107  CO2 21* 21* 19* 21* 25 24 28   GLUCOSE 113* 99 107* 87 110* 965* 163*  BUN <5* <5* 17 19 13 7 7   CREATININE 0.39* 0.39* 1.36* 0.92 0.56 0.62  0.46  CALCIUM 8.5* 8.4* 8.6* 8.1* 7.7* 7.9* 7.9*  MG 1.8 1.9 3.1* 2.8* 2.4  --   --   PHOS  --   --   --  3.7 3.4  --   --    GFR: Estimated Creatinine Clearance: 78.6 mL/min (by C-G formula based on SCr of 0.46 mg/dL).  Liver Function Tests: Recent Labs  Lab 08/17/21 0700 08/19/21 0405  AST 25 17  ALT 13 10  ALKPHOS 112 82  BILITOT 0.9 0.8  PROT 7.2 5.5*  ALBUMIN 2.7* 1.9*   Sepsis Labs: No results for input(s): PROCALCITON, LATICACIDVEN in the last 168 hours.  Recent Results (from the past 240 hour(s))  Culture, blood (routine x 2)     Status: None   Collection Time: 08/10/21  2:32 PM   Specimen: Left Antecubital; Blood  Result Value Ref Range Status   Specimen Description LEFT ANTECUBITAL  Final   Special Requests   Final    BOTTLES DRAWN AEROBIC AND ANAEROBIC  Blood Culture adequate volume   Culture   Final    NO GROWTH 5 DAYS Performed at [facility], [address] [place], [place] [address]    Report Status 08/15/2021 FINAL  Final  Culture, blood (routine x 2)     Status: None   Collection Time: 08/10/21  2:32 PM   Specimen: BLOOD LEFT HAND  Result Value Ref Range Status   Specimen Description BLOOD LEFT HAND  Final   Special Requests   Final    BOTTLES DRAWN AEROBIC AND ANAEROBIC Blood Culture results may not be optimal due to an excessive volume of blood received in culture bottles   Culture   Final    NO GROWTH 5 DAYS Performed at [facility], [address], [place], [place] [address]    Report Status 08/15/2021 FINAL  Final     Radiology Studies: DG Abd Portable 1V  Result Date: 08/19/2021 CLINICAL DATA:  OG tube placement EXAM: PORTABLE ABDOMEN - 1 VIEW COMPARISON:  Plain film of the abdomen dated 08/18/2021. CT abdomen dated 08/16/2021. FINDINGS: The enteric tube is no longer visualized, presumably retracted to the level of the upper chest or beyond. Continued evidence of small-bowel obstruction with dilated gas-filled small bowel loops throughout the central abdomen. No evidence of free intraperitoneal air. IMPRESSION: 1. The enteric tube is no longer visualized, presumably retracted to the level of the upper chest or beyond the upper chest. Recommend repositioning into the stomach. 2. Continued evidence of small-bowel obstruction. Electronically Signed   By: Franki Cabot M.D.   On: 08/19/2021 13:08     Scheduled Meds:  chlorhexidine  15 mL Mouth Rinse BID   Chlorhexidine Gluconate Cloth  6 each Topical Daily   heparin  5,000 Units Subcutaneous Q8H   insulin aspart  0-15 Units Subcutaneous Q6H   mouth rinse  15 mL Mouth Rinse q12n4p   metoprolol tartrate  2.5 mg Intravenous Q6H   pantoprazole (PROTONIX) IV  40 mg Intravenous Q12H   sodium chloride flush  10-40 mL Intracatheter Q12H   Continuous Infusions:  lactated ringers  20 mL/hr at 08/20/21 0550   methocarbamol (ROBAXIN) IV Stopped (08/18/21 0730)   potassium chloride 10 mEq (08/20/21 1228)   TPN ADULT (ION) 80 mL/hr at 08/20/21 0550   TPN ADULT (ION)       LOS: 9 days    Barton Dubois, MD [facility]  If 7PM-7AM, please contact night-coverage [email or web address] 08/20/2021, 1:26 PM

## 2021-08-20 NOTE — Progress Notes (Signed)
[facility] Progress Note     Subjective: Says abdomen feels better, NG is putting out but less, stool continue from the ostomy. She says she sat up in chair/ bed yesterday. She has not ambulated. Leukocytosis resolved and K was elevated on false draw this AM, repeat normal.   Objective: Vital signs in last 24 hours: Temp:  [97.9 F (36.6 C)-99.4 F (37.4 C)] 98.9 F (37.2 C) (02/04 0433) Pulse Rate:  [94-110] 110 (02/04 0433) Resp:  [19-29] 20 (02/04 0433) BP: (91-114)/(55-79) 105/77 (02/04 0433) SpO2:  [93 %-100 %] 98 % (02/04 0433) Last BM Date: 08/19/21  Intake/Output from previous day: 02/03 0701 - 02/04 0700 In: 4624.8 [P.O.:440; I.V.:2617.7; IV Piggyback:1567.1] Out: 4000 [Urine:650; Emesis/NG output:2750; Stool:600] Intake/Output this shift: No intake/output data recorded.  General appearance: alert and no distress Resp: normal work of breathing GI: soft, parastomal hernia, reduces easily, minimally tender, pink ostomy with liquid stool in bag   Lab Results:  Recent Labs    08/18/21 0221 08/20/21 0457  WBC 20.2* 9.9  HGB 9.6* 8.3*  HCT 28.8* 26.9*  PLT 607* 515*   BMET Recent Labs    08/20/21 0715 08/20/21 0838  NA 131* 139  K >7.5* 3.2*  CL 100 107  CO2 24 28  GLUCOSE 965* 163*  BUN 7 7  CREATININE 0.62 0.46  CALCIUM 7.9* 7.9*   PT/INR No results for input(s): LABPROT, INR in the last 72 hours.  Studies/Results: DG Abd Portable 1V  Result Date: 08/19/2021 CLINICAL DATA:  OG tube placement EXAM: PORTABLE ABDOMEN - 1 VIEW COMPARISON:  Plain film of the abdomen dated 08/18/2021. CT abdomen dated 08/16/2021. FINDINGS: The enteric tube is no longer visualized, presumably retracted to the level of the upper chest or beyond. Continued evidence of small-bowel obstruction with dilated gas-filled small bowel loops throughout the central abdomen. No evidence of free intraperitoneal air. IMPRESSION: 1. The enteric tube is no longer visualized,  presumably retracted to the level of the upper chest or beyond the upper chest. Recommend repositioning into the stomach. 2. Continued evidence of small-bowel obstruction. Electronically Signed   By: Franki Cabot M.D.   On: 08/19/2021 13:08    Anti-infectives: Anti-infectives (From admission, onward)    Start     Dose/Rate Route Frequency Ordered Stop   08/19/21 1800  metroNIDAZOLE (FLAGYL) IVPB 500 mg        500 mg 100 mL/hr over 60 Minutes Intravenous Every 8 hours 08/19/21 1117 08/19/21 1900   08/19/21 1700  ciprofloxacin (CIPRO) IVPB 400 mg        400 mg 200 mL/hr over 60 Minutes Intravenous Every 12 hours 08/19/21 1117 08/19/21 1755   08/10/21 1000  ciprofloxacin (CIPRO) IVPB 400 mg  Status:  Discontinued        400 mg 200 mL/hr over 60 Minutes Intravenous Every 12 hours 08/10/21 0824 08/19/21 1117   08/10/21 0915  metroNIDAZOLE (FLAGYL) IVPB 500 mg  Status:  Discontinued        500 mg 100 mL/hr over 60 Minutes Intravenous Every 8 hours 08/10/21 0824 08/19/21 1117       Assessment/Plan: Patient with SBO related to her parastomal hernia. Reduces with palpation, ostomy pink and has output., NG output going down.   Continue conservative management with NG and NPO TPN for nutrition    LOS: 9 days    Virl Cagey 08/20/2021

## 2021-08-21 DIAGNOSIS — K56609 Unspecified intestinal obstruction, unspecified as to partial versus complete obstruction: Secondary | ICD-10-CM

## 2021-08-21 LAB — BASIC METABOLIC PANEL
Anion gap: 7 (ref 5–15)
BUN: 6 mg/dL (ref 6–20)
CO2: 31 mmol/L (ref 22–32)
Calcium: 8.3 mg/dL — ABNORMAL LOW (ref 8.9–10.3)
Chloride: 100 mmol/L (ref 98–111)
Creatinine, Ser: 0.39 mg/dL — ABNORMAL LOW (ref 0.44–1.00)
GFR, Estimated: >60 mL/min (ref >60)
Glucose, Bld: 142 mg/dL — ABNORMAL HIGH (ref 70–99)
Potassium: 3.8 mmol/L (ref 3.5–5.1)
Sodium: 138 mmol/L (ref 135–145)

## 2021-08-21 LAB — GLUCOSE, CAPILLARY
Glucose-Capillary: 124 mg/dL — ABNORMAL HIGH (ref 70–99)
Glucose-Capillary: 129 mg/dL — ABNORMAL HIGH (ref 70–99)
Glucose-Capillary: 130 mg/dL — ABNORMAL HIGH (ref 70–99)
Glucose-Capillary: 134 mg/dL — ABNORMAL HIGH (ref 70–99)
Glucose-Capillary: 144 mg/dL — ABNORMAL HIGH (ref 70–99)

## 2021-08-21 MED ORDER — TRAVASOL 10 % IV SOLN
INTRAVENOUS | Status: AC
  Filled 2021-08-21: qty 921.6

## 2021-08-21 NOTE — Progress Notes (Signed)
°   08/21/21 1200  Assess: MEWS Score  BP 106/81  Pulse Rate (!) 122  Level of Consciousness Alert  SpO2 95 %  O2 Device Room Air  Assess: MEWS Score  MEWS Temp 0  MEWS Systolic 0  MEWS Pulse 2  MEWS RR 0  MEWS LOC 0  MEWS Score 2  MEWS Score Color Yellow  Assess: if the MEWS score is Yellow or Red  Were vital signs taken at a resting state? Yes  Focused Assessment No change from prior assessment  Early Detection of Sepsis Score *See Row Information* Low  MEWS guidelines implemented *See Row Information* Yes  Take Vital Signs  Increase Vital Sign Frequency  Yellow: Q 2hr X 2 then Q 4hr X 2, if remains yellow, continue Q 4hrs  Escalate  MEWS: Escalate Yellow: discuss with charge nurse/RN and consider discussing with provider and RRT  Notify: Charge Nurse/RN  Name of Charge Nurse/RN Notified Rachel, RN  Date Charge Nurse/RN Notified 08/21/21  Time Charge Nurse/RN Notified 1211  Notify: Provider  Provider Name/Title Dr, Dyann Kief  Date Provider Notified 08/21/21  Time Provider Notified 1211  Notification Type Page  Notification Reason Other (Comment) (Pulse in 140;s when transferring)

## 2021-08-21 NOTE — Progress Notes (Addendum)
PHARMACY - TOTAL PARENTERAL NUTRITION CONSULT NOTE   Indication: Small bowel obstruction  Patient Measurements: Height: 4' 10"  (147.3 cm) Weight: 83.5 kg (184 lb 1.4 oz) IBW/kg (Calculated) : 40.9 TPN AdjBW (KG): 49 Body mass index is 38.47 kg/m.  Assessment:  51 yo female with medical history significant for large parastomal hernia and recent colitis with high output from her ostomy now with concern for SBO. Repeat CT 01/31 verified partial bowel obstruction. Patient remains NPO. Pharmacy consulted to initiate TPN. Patient tolerating TPN. NG output going down  Glucose / Insulin:  101-109, 15 units insulin Electrolytes: K 3.8  mag 2.4, Ca 8.3, Alb 1.9 WNL for others Renal: Scr 0.56 Hepatic: WNL Intake / Output;  MIVF: LR @ 60 mL/hr > 80m/hr  Central access: PICC 2/1 TPN start date: 2/2  Nutritional Goals: Goal TPN rate is 80 mL/hr (provides 92 g of protein and 1667 kcals per day)  RD Assessment: Estimated Needs Total Energy Estimated Needs: 1500-1700 Total Protein Estimated Needs: 90-99 gr Total Fluid Estimated Needs: >1700 ml daily  Current Nutrition:  NPO  Plan:   Continue TPN to 849mhr at 1800 Electrolytes in TPN: Na 5067mL, K 61m58m, Ca 5mEq65m Mg 5mEq/34mand Phos 15mmol63mCl:Ac 1:1 Add standard MVI and trace elements to TPN  Moderate q6h SSI and adjust as needed  Reduce MIVF to 20 mL/hr at 1800 Monitor TPN labs on Mon/Thurs,   Mystique Bjelland PIsac Sarnaarm D, BCPS ClCaliforniaal Pharmacist Pager #[phone] 9:24 AM

## 2021-08-21 NOTE — Progress Notes (Signed)
[facility] Progress Note     Subjective: More out of ostomy today and less out of Ng. Looking better and ambulated yesterday. Feeling better.   Objective: Vital signs in last 24 hours: Temp:  [98.4 F (36.9 C)] 98.4 F (36.9 C) (02/04 2131) Pulse Rate:  [107-122] 110 (02/05 1233) BP: (104-113)/(71-83) 106/71 (02/05 1233) SpO2:  [95 %-96 %] 96 % (02/05 1233) Last BM Date: 08/20/21  Intake/Output from previous day: 02/04 0701 - 02/05 0700 In: 1814.3 [I.V.:1525.9; IV Piggyback:288.4] Out: 1600 [Emesis/NG output:1200; Stool:400] Intake/Output this shift: Total I/O In: -  Out: 1202 [Stool:1202]  General appearance: alert and no distress GI: soft, easily reducible parastomal hernia, ostomy pink  Lab Results:  Recent Labs    08/20/21 0457  WBC 9.9  HGB 8.3*  HCT 26.9*  PLT 515*   BMET Recent Labs    08/20/21 0838 08/21/21 0626  NA 139 138  K 3.2* 3.8  CL 107 100  CO2 28 31  GLUCOSE 163* 142*  BUN 7 6  CREATININE 0.46 0.39*  CALCIUM 7.9* 8.3*   PT/INR No results for input(s): LABPROT, INR in the last 72 hours.  Studies/Results: No results found.  Anti-infectives: Anti-infectives (From admission, onward)    Start     Dose/Rate Route Frequency Ordered Stop   08/19/21 1800  metroNIDAZOLE (FLAGYL) IVPB 500 mg        500 mg 100 mL/hr over 60 Minutes Intravenous Every 8 hours 08/19/21 1117 08/19/21 1900   08/19/21 1700  ciprofloxacin (CIPRO) IVPB 400 mg        400 mg 200 mL/hr over 60 Minutes Intravenous Every 12 hours 08/19/21 1117 08/19/21 1755   08/10/21 1000  ciprofloxacin (CIPRO) IVPB 400 mg  Status:  Discontinued        400 mg 200 mL/hr over 60 Minutes Intravenous Every 12 hours 08/10/21 0824 08/19/21 1117   08/10/21 0915  metroNIDAZOLE (FLAGYL) IVPB 500 mg  Status:  Discontinued        500 mg 100 mL/hr over 60 Minutes Intravenous Every 8 hours 08/10/21 0824 08/19/21 1117       Assessment/Plan: Patient with parastomal hernia  and SBO. Doing better. NG down and ostomy output up.   Overall improving Ambulate Will order a binder and have RN cut a hole in it for the ostomy and appliance, use this as a hernia belt for the parastomal hernia  KUB in am   LOS: 10 days    Virl Cagey 08/21/2021

## 2021-08-21 NOTE — Progress Notes (Signed)
VS retaken Pulse 110. No need to escalate at this time  08/21/21 1233  Vitals  BP 106/71  MAP (mmHg) 81  BP Method Automatic  Pulse Rate (!) 110  Pulse Rate Source Monitor  MEWS COLOR  MEWS Score Color Green  Oxygen Therapy  SpO2 96 %  O2 Device Room Air  MEWS Score  MEWS Temp 0  MEWS Systolic 0  MEWS Pulse 1  MEWS RR 0  MEWS LOC 0  MEWS Score 1

## 2021-08-21 NOTE — Progress Notes (Signed)
°   08/21/21 1233  Vitals  BP 106/71  MAP (mmHg) 81  BP Method Automatic  Pulse Rate (!) 110  Pulse Rate Source Monitor  MEWS COLOR  MEWS Score Color Green  Oxygen Therapy  SpO2 96 %  O2 Device Room Air  MEWS Score  MEWS Temp 0  MEWS Systolic 0  MEWS Pulse 1  MEWS RR 0  MEWS LOC 0  MEWS Score 1

## 2021-08-21 NOTE — Progress Notes (Signed)
PROGRESS NOTE    Alyssa Carey  GXQ:[id number] DOB: 09-09-1970 DOA: 08/09/2021 PCP: Caryl Bis, MD   Brief Narrative:   Alyssa Carey  is a 51 y.o. 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  Patient was admitted with pancolitis and associated dehydration as well as electrolyte abnormalities with hypokalemia and hypomagnesemia in the setting of ongoing high output/diarrhea.  She has been started on ciprofloxacin and Flagyl empirically on 1/25 and GI pathogen panel as well as C. difficile testing has been ordered which have now returned negative.  General surgery had initially evaluated patient on account of the parastomal hernia, but patient was improving and therefore they had signed off.  She now is having some persistent nausea and vomiting along with abdominal pain and repeat CT imaging on 1/31 demonstrates high-grade bowel obstruction findings for which general surgery has been reconsulted and has recommended patient to be n.p.o. and to place NG tube.    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 -GI pathogen panel negative and C. difficile testing negative -No recent use of antibiotics -Continue supportive care.  New findings of high-grade small bowel obstruction in the setting of parastomal hernia -Patient is stable to continue conservative management for now. -NG tube with clamping sessions at this time.  We will follow general surgery recommendation for timing of discontinuation. -Will continue TPN for nutrition  purposes.  Dehydration secondary to above -With noted high ostomy output at time of admission, now decreasing -Continue IV fluid resuscitation. -Continue to follow clinical response and provide support. -Continue TPN.   Sinus tachycardia -ongoing; but improved.  Currently 110 max HR -Likely related to the dehydration as well as persistent electrolyte abnormalities -No sign of anxiety noted -TSH 0.6 -D-dimer noted to be 5.71, will CT angiogram negative for PE -Check urine tox and echo 1/29 -Started on metoprolol 1/30 after discussion with cardiology continue adjusted dose of metoprolol 25 mg twice a day currently IV as patient unable to tolerate p.o.   Moderate protein calorie malnutrition -Currently n.p.o. -NG tube will be placed follow recommendation by general surgery -Continue TPN management. -Follow clinical response and continue supportive care.   FOBT positive -Continue to monitor hemoglobin trend; no overt bleeding appreciated. -Continue treatment for PPI as mentioned below.   History of PUD/alcoholic gastritis -Continue IV PPI   Alcoholic liver disease -Continue CIWA protocol -No active withdrawal symptoms appreciated. -Cessation counseling provided. -Continue to follow LFTs.  Leukocytosis -In the setting of pancolitis (and chemo concentration from severe dehydration. -Continue current antibiotic therapy and fluid resuscitation -WBCs 9.9; patient remains afebrile. -Continue to follow trend intermittently. -No signs of acute infection appreciated currently.  Hypokalemia -In the setting of GI losses and NG tube -Continue to follow electrolytes and further replete as needed. -Patient also receiving TPN at this time. -Potassium 3.8 currently     DVT prophylaxis: Heparin Code Status: Full Family Communication: None at bedside Disposition Plan:  Status is: Inpatient   Remains inpatient appropriate because: Continuous need for IV fluids and medications.  Consultants:  General surgery   Procedures:  See below  Antimicrobials:  Anti-infectives (From admission, onward)    Start     Dose/Rate Route Frequency Ordered Stop   08/19/21 1800  metroNIDAZOLE (FLAGYL) IVPB 500 mg        500 mg 100 mL/hr over 60 Minutes Intravenous Every 8 hours 08/19/21 1117 08/19/21 1900   08/19/21 1700  ciprofloxacin (CIPRO) IVPB 400 mg        400 mg 200 mL/hr over 60 Minutes Intravenous Every 12 hours 08/19/21 1117 08/19/21 1755   08/10/21 1000  ciprofloxacin (CIPRO) IVPB 400 mg  Status:  Discontinued        400 mg 200 mL/hr over 60 Minutes Intravenous Every 12 hours 08/10/21 0824 08/19/21 1117   08/10/21 0915  metroNIDAZOLE (FLAGYL) IVPB 500 mg  Status:  Discontinued        500 mg 100 mL/hr over 60 Minutes Intravenous Every 8 hours 08/10/21 0824 08/19/21 1117      Subjective: In good spirits, reports sore throat and being hungry.  Asking if the NG tube will come out today.  She has demonstrated decrease in NG tube output and also increase in her ostomy output.  No nausea, no vomiting.  Patient is afebrile.  Objective: Vitals:   08/20/21 2131 08/21/21 0247 08/21/21 1200 08/21/21 1233  BP: 113/83 104/72 106/81 106/71  Pulse: (!) 107 (!) 110 (!) 122 (!) 110  Resp:      Temp: 98.4 F (36.9 C)     TempSrc: Oral     SpO2: 96%  95% 96%  Weight:      Height:        Intake/Output Summary (Last 24 hours) at 08/21/2021 1444 Last data filed at 08/21/2021 1400 Gross per 24 hour  Intake 1814.31 ml  Output 2802 ml  Net -987.69 ml   Filed Weights   08/17/21 0545 08/18/21 0500 08/19/21 0500  Weight: 85.6 kg 84.7 kg 83.5 kg    Examination: General exam: Alert, awake, oriented x 3, in good spirits overall; reports abdomen continues to move, no nausea, no vomiting, no shortness of breath, no chest pain.  Would like NG tube out. Respiratory system: Good air movement bilaterally, no using accessory muscles, normal respiratory effort.  Good saturation on  room air. Cardiovascular system: Intermittent sinus tachycardia (mainly with activity); no chest pain, no rubs, no gallops, no JVD. Gastrointestinal system: NG tube in place, positive bowel sounds, increased ostomy output appreciated.  No guarding and no distention.  Positive reducible large parastomal hernia appreciated on exam. Central nervous system: Alert and oriented. No focal neurological deficits. Extremities: No cyanosis or clubbing. Skin: No petechiae. Psychiatry: Judgement and insight appear normal. Mood & affect appropriate.    Data Reviewed: I have personally reviewed following labs and imaging studies  CBC: Recent Labs  Lab 08/15/21 0245 08/16/21 0351 08/17/21 0700 08/18/21 0221 08/20/21 0457  WBC 12.3* 16.7* 19.2* 20.2* 9.9  NEUTROABS  --   --   --  16.0*  --   HGB 10.8* 11.1* 12.1 9.6* 8.3*  HCT 34.6* 34.3* 38.5 28.8* 26.9*  MCV 95.6 97.2 98.5 94.1 100.0  PLT 504* 559* 652* 607* 102*   Basic Metabolic Panel: Recent Labs  Lab 08/15/21 0245 08/17/21 0700 08/18/21 0221 08/19/21 0405 08/20/21 0715 08/20/21 0838 08/21/21 0626  NA 137 134* 135 139 131* 139 138  K 4.1 3.4* 3.7 2.7* >7.5* 3.2* 3.8  CL 107 102 104 107 100 107 100  CO2 21*  19* 21* 25 24 28 31   GLUCOSE 99 107* 87 110* 965* 163* 142*  BUN <5* 17 19 13 7 7 6   CREATININE 0.39* 1.36* 0.92 0.56 0.62 0.46 0.39*  CALCIUM 8.4* 8.6* 8.1* 7.7* 7.9* 7.9* 8.3*  MG 1.9 3.1* 2.8* 2.4  --   --   --   PHOS  --   --  3.7 3.4  --   --   --    GFR: Estimated Creatinine Clearance: 78.6 mL/min (A) (by C-G formula based on SCr of 0.39 mg/dL (L)).  Liver Function Tests: Recent Labs  Lab 08/17/21 0700 08/19/21 0405  AST 25 17  ALT 13 10  ALKPHOS 112 82  BILITOT 0.9 0.8  PROT 7.2 5.5*  ALBUMIN 2.7* 1.9*   Radiology Studies: No results found.   Scheduled Meds:  chlorhexidine  15 mL Mouth Rinse BID   Chlorhexidine Gluconate Cloth  6 each Topical Daily   heparin  5,000 Units Subcutaneous Q8H   insulin  aspart  0-15 Units Subcutaneous Q6H   mouth rinse  15 mL Mouth Rinse q12n4p   metoprolol tartrate  2.5 mg Intravenous Q6H   pantoprazole (PROTONIX) IV  40 mg Intravenous Q12H   sodium chloride flush  10-40 mL Intracatheter Q12H   Continuous Infusions:  methocarbamol (ROBAXIN) IV Stopped (08/18/21 0730)   TPN ADULT (ION) 80 mL/hr at 08/20/21 1804   TPN ADULT (ION)       LOS: 10 days    Barton Dubois, MD [facility]  If 7PM-7AM, please contact night-coverage [email or web address] 08/21/2021, 2:44 PM

## 2021-08-21 NOTE — Evaluation (Signed)
Physical Therapy Evaluation Patient Details Name: Alyssa Carey MRN: [id number] DOB: 12/08/1970 Today's Date: 08/21/2021  History of Present Illness  Alyssa Carey  is a 51 y.o. 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  Patient was admitted with pancolitis and associated dehydration as well as electrolyte abnormalities with hypokalemia and hypomagnesemia in the setting of ongoing high output/diarrhea.  She has been started on ciprofloxacin and Flagyl empirically on 1/25 and GI pathogen panel as well as C. difficile testing has been ordered which have now returned negative.  General surgery had initially evaluated patient on account of the parastomal hernia, but patient was improving and therefore they had signed off.  She now is having some persistent nausea and vomiting along with abdominal pain and repeat CT imaging on 1/31 demonstrates high-grade bowel obstruction findings for which general surgery has been reconsulted and has recommended patient to be n.p.o. and to place NG tube.   Clinical Impression  Patient agreeable to participating in PT evaluation today. Patient required assistance to managed IV pole and leads/lines throughout session. Otherwise, patient was able to perform all mobility modified independent. Patient reported she walked in her room with family at her side to manage IV pole and leads and lines for her. Patient showed good endurance for activity, ambulating over 250 feet without assistive device. PT encourage patient to continue ambulating with family and nursing assistance for IV pole and leads/lines. Patient agreed. Patient evaluated by Physical Therapy with no further acute PT needs identified. All education has been completed and the patient has no further questions.  See below for any follow-up Physical Therapy or equipment needs. PT is signing off. Thank you  for this referral.      Recommendations for follow up therapy are one component of a multi-disciplinary discharge planning process, led by the attending physician.  Recommendations may be updated based on patient status, additional functional criteria and insurance authorization.  Follow Up Recommendations No PT follow up    Assistance Recommended at Discharge PRN  Patient can return home with the following  A little help with walking and/or transfers;A little help with bathing/dressing/bathroom;Assistance with cooking/housework;Help with stairs or ramp for entrance;Assist for transportation    Equipment Recommendations None recommended by PT  Recommendations for Other Services       Functional Status Assessment Patient has had a recent decline in their functional status and demonstrates the ability to make significant improvements in function in a reasonable and predictable amount of time.     Precautions / Restrictions Precautions Precautions: Fall Precaution Comments: patient reports one fall in last six months - tripped up by pant leg and shoes. Restrictions Weight Bearing Restrictions: No      Mobility  Bed Mobility Overal bed mobility: Modified Independent Bed Mobility: Supine to Sit     Supine to sit: Supervision, HOB elevated     General bed mobility comments: use of bedrails; independent use of elevating head of bed    Transfers Overall transfer level: Modified independent Equipment used: None Transfers: Sit to/from Stand Sit to Stand: Modified independent (Device/Increase time), Min assist   General transfer comment: assistance for management of IV pole/lines    Ambulation/Gait Ambulation/Gait assistance: Modified independent (Device/Increase time), Min assist Gait Distance (Feet): 250 Feet Assistive device: None Gait Pattern/deviations: Step-through pattern, Decreased step length - right, Decreased step length - left, Decreased stride length Gait  velocity: decreased     General Gait Details: assistance for management of IV pole/lines; somewhat slow steady cadence limited by small space within room to ambulate per patient's wishes due to diarrhea; limited by fatigue; on room air  Stairs      Wheelchair Mobility    Modified Rankin (Stroke Patients Only)       Balance Overall balance assessment: Modified Independent           Pertinent Vitals/Pain Pain Assessment Pain Assessment: 0-10 Pain Score: 7  Pain Location: nose, left ear and back of throat Pain Intervention(s): Limited activity within patient's tolerance, Monitored during session, Premedicated before session    Home Living Family/patient expects to be discharged to:: Private residence Living Arrangements: Children;Other relatives Available Help at Discharge: Family;Available 24 hours/day Type of Home: House Home Access: Stairs to enter Entrance Stairs-Rails: Left Entrance Stairs-Number of Steps: 2   Home Layout: One level Home Equipment: Cane - single point      Prior Function Prior Level of Function : Independent/Modified Independent         Hand Dominance        Extremity/Trunk Assessment   Upper Extremity Assessment Upper Extremity Assessment: Overall WFL for tasks assessed    Lower Extremity Assessment Lower Extremity Assessment: Overall WFL for tasks assessed    Cervical / Trunk Assessment Cervical / Trunk Assessment: Normal  Communication   Communication: No difficulties  Cognition Arousal/Alertness: Awake/alert Behavior During Therapy: WFL for tasks assessed/performed Overall Cognitive Status: Within Functional Limits for tasks assessed      General Comments General comments (skin integrity, edema, etc.): scars on right knee from fall; colostomy with hernia visualized    Exercises     Assessment/Plan    PT Assessment Patient does not need any further PT services  PT Problem List         PT Treatment Interventions       PT Goals (Current goals can be found in the Care Plan section)  Acute Rehab PT Goals Patient Stated Goal: Keep moving and go home. PT Goal Formulation: With patient Time For Goal Achievement: 08/28/21 Potential to Achieve Goals: Good    Frequency          AM-PAC PT "6 Clicks" Mobility  Outcome Measure Help needed turning from your back to your side while in a flat bed without using bedrails?: None Help needed moving from lying on your back to sitting on the side of a flat bed without using bedrails?: A Little Help needed moving to and from a bed to a chair (including a wheelchair)?: None Help needed standing up from a chair using your arms (e.g., wheelchair or bedside chair)?: None Help needed to walk in hospital room?: A Little Help needed climbing 3-5 steps with a railing? : A Little 6 Click Score: 21    End of Session   Activity Tolerance: Patient tolerated treatment well;Patient limited by fatigue Patient left: in bed;with nursing/sitter in room;with call bell/phone within reach Nurse Communication: Mobility status PT Visit Diagnosis: Unsteadiness on feet (R26.81);Difficulty in walking, not elsewhere classified (R26.2)    Time: 6568-1275 PT Time Calculation (min) (ACUTE ONLY): 30 min   Charges:   PT Evaluation $PT Eval Low Complexity: 1 Low PT Treatments $Therapeutic Activity: 8-22 mins        Floria Raveling. Hartnett-Rands, MS, PT Per [place] [phone]  Pamala Hurry  Hartnett-Rands 08/21/2021, 12:23 PM

## 2021-08-22 ENCOUNTER — Inpatient Hospital Stay (HOSPITAL_COMMUNITY): Payer: Self-pay

## 2021-08-22 LAB — COMPREHENSIVE METABOLIC PANEL
ALT: 9 U/L (ref 0–44)
AST: 17 U/L (ref 15–41)
Albumin: 1.8 g/dL — ABNORMAL LOW (ref 3.5–5.0)
Alkaline Phosphatase: 64 U/L (ref 38–126)
Anion gap: 6 (ref 5–15)
BUN: 7 mg/dL (ref 6–20)
CO2: 30 mmol/L (ref 22–32)
Calcium: 8.1 mg/dL — ABNORMAL LOW (ref 8.9–10.3)
Chloride: 99 mmol/L (ref 98–111)
Creatinine, Ser: 0.33 mg/dL — ABNORMAL LOW (ref 0.44–1.00)
GFR, Estimated: >60 mL/min (ref >60)
Glucose, Bld: 120 mg/dL — ABNORMAL HIGH (ref 70–99)
Potassium: 3.9 mmol/L (ref 3.5–5.1)
Sodium: 135 mmol/L (ref 135–145)
Total Bilirubin: 0.1 mg/dL — ABNORMAL LOW (ref 0.3–1.2)
Total Protein: 5.8 g/dL — ABNORMAL LOW (ref 6.5–8.1)

## 2021-08-22 LAB — GLUCOSE, CAPILLARY
Glucose-Capillary: 113 mg/dL — ABNORMAL HIGH (ref 70–99)
Glucose-Capillary: 114 mg/dL — ABNORMAL HIGH (ref 70–99)
Glucose-Capillary: 131 mg/dL — ABNORMAL HIGH (ref 70–99)
Glucose-Capillary: 138 mg/dL — ABNORMAL HIGH (ref 70–99)

## 2021-08-22 LAB — MAGNESIUM: Magnesium: 1.5 mg/dL — ABNORMAL LOW (ref 1.7–2.4)

## 2021-08-22 LAB — PHOSPHORUS: Phosphorus: 3.1 mg/dL (ref 2.5–4.6)

## 2021-08-22 LAB — TRIGLYCERIDES: Triglycerides: 462 mg/dL — ABNORMAL HIGH (ref <150)

## 2021-08-22 MED ORDER — TRAVASOL 10 % IV SOLN
INTRAVENOUS | Status: AC
  Filled 2021-08-22: qty 921.6

## 2021-08-22 MED ORDER — MAGNESIUM SULFATE 2 GM/50ML IV SOLN
2.0000 g | Freq: Once | INTRAVENOUS | Status: AC
  Administered 2021-08-22: 2 g via INTRAVENOUS
  Filled 2021-08-22: qty 50

## 2021-08-22 NOTE — Progress Notes (Signed)
PHARMACY - TOTAL PARENTERAL NUTRITION CONSULT NOTE   Indication: Small bowel obstruction  Patient Measurements: Height: 4' 10"  (147.3 cm) Weight: 83.5 kg (184 lb 1.4 oz) IBW/kg (Calculated) : 40.9 TPN AdjBW (KG): 49 Body mass index is 38.47 kg/m.  Assessment:  51 yo female with medical history significant for large parastomal hernia and recent colitis with high output from her ostomy now with concern for SBO. Repeat CT 01/31 verified partial bowel obstruction. Patient remains NPO. Pharmacy consulted to initiate TPN. Patient tolerating TPN. NG output going down  Glucose / Insulin:  129-138, 6 units insulin Electrolytes: K 3.9  mag 1.5, Ca 8.3, Alb 1.9 WNL for others Renal: Scr 0.56 Hepatic: WNL Intake / Output;  MIVF: LR @ 60 mL/hr > 62m/hr  Central access: PICC 2/1 TPN start date: 2/2  Nutritional Goals: Goal TPN rate is 80 mL/hr (provides 92 g of protein and 1667 kcals per day)  RD Assessment: Estimated Needs Total Energy Estimated Needs: 1500-1700 Total Protein Estimated Needs: 90-99 gr Total Fluid Estimated Needs: >1700 ml daily  Current Nutrition:  NPO  Plan:   Continue TPN to 833mhr at 1800 Electrolytes in TPN: Na 5069mL, K 49m30m, Ca 5mEq26m Mg 8mEq/46mand Phos 15mmol59mCl:Ac 1:1 Add standard MVI and trace elements to TPN Magnesium Sulfate 2gm IV x 1, F/U BMP in AM  Moderate q6h SSI and adjust as needed  Reduce MIVF to 20 mL/hr at 1800 Monitor TPN labs on Mon/Thurs,   Yojan Paskett PIsac Sarnaarm D, BCPS [facility] Pharmacist Pager #[phone] 9:31 AM

## 2021-08-22 NOTE — Progress Notes (Signed)
PROGRESS NOTE    Alyssa Carey  FFM:[id number] DOB: [birth year] DOA: 08/09/2021 PCP: Caryl Bis, MD   Brief Narrative:   Alyssa Carey  is a 51 y.o. 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  Patient was admitted with pancolitis and associated dehydration as well as electrolyte abnormalities with hypokalemia and hypomagnesemia in the setting of ongoing high output/diarrhea.  She has been started on ciprofloxacin and Flagyl empirically on 1/25 and GI pathogen panel as well as C. difficile testing has been ordered which have now returned negative.  General surgery had initially evaluated patient on account of the parastomal hernia, but patient was improving and therefore they had signed off.  She now is having some persistent nausea and vomiting along with abdominal pain and repeat CT imaging on 1/31 demonstrates high-grade bowel obstruction findings for which general surgery has been reconsulted and has recommended patient to be n.p.o. and to place NG tube.    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 -GI pathogen panel negative and C. difficile testing negative -No recent use of antibiotics -Continue supportive care.  New findings of high-grade small bowel obstruction in the setting of parastomal hernia -Patient is stable to continue conservative management for now. -SBFT demonstrating contrast has moved through most of the small bowel but not into the colon yet.  Continue to follow results in the morning.  So far  appears that has passed the hernia area. -Continue NG tube clamp cessation without suction. -Will continue TPN for nutrition purposes. -Continue to follow recommendations by general surgery.  Dehydration secondary to above -With noted high ostomy output at time of admission, now decreasing -Continue IV fluid resuscitation. -Continue to follow clinical response and provide support. -Continue TPN.   Sinus tachycardia -ongoing; but improved.  Currently 110 max HR -Likely related to the dehydration as well as persistent electrolyte abnormalities -No sign of anxiety noted -TSH 0.6 -D-dimer noted to be 5.71, will CT angiogram negative for PE -Check urine tox and echo 1/29 -Started on metoprolol 1/30 after discussion with cardiology continue adjusted dose of metoprolol 25 mg twice a day currently IV as patient unable to tolerate p.o.   Moderate protein calorie malnutrition -Currently n.p.o. -NG tube will be placed follow recommendation by general surgery -Continue TPN management. -Follow clinical response and continue supportive care. -So far well-tolerated.   FOBT positive -Continue to monitor hemoglobin trend; no overt bleeding appreciated. -Continue treatment for PPI as mentioned below. -Patient will benefit outpatient follow-up with gastroenterology service.   History of PUD/alcoholic gastritis -Continue the use of PPI; still given IV due to ongoing n.p.o. status.   Alcoholic liver disease -Continue CIWA protocol -No active withdrawal symptoms appreciated currently. -Cessation counseling provided. -Continue to follow LFTs.  Leukocytosis -In the setting of pancolitis (and chemo concentration from severe dehydration. -Continue current antibiotic therapy and fluid resuscitation -WBCs 9.9; patient remains afebrile. -Continue to follow trend intermittently. -No signs of acute infection appreciated currently.  Hypokalemia -In the setting of GI losses and NG tube -Continue to  follow electrolytes and further  replete as needed. -Patient also receiving TPN at this time. -Potassium 3.9 currently  Hypomagnesemia -Magnesium 1.5 -Continue repletion as needed and follow trend.     DVT prophylaxis: Heparin Code Status: Full Family Communication: None at bedside Disposition Plan:  Status is: Inpatient   Remains inpatient appropriate because: Continuous need for IV fluids and medications.       Consultants:  General surgery   Procedures:  See below  Antimicrobials:  Anti-infectives (From admission, onward)    Start     Dose/Rate Route Frequency Ordered Stop   08/19/21 1800  metroNIDAZOLE (FLAGYL) IVPB 500 mg        500 mg 100 mL/hr over 60 Minutes Intravenous Every 8 hours 08/19/21 1117 08/19/21 1900   08/19/21 1700  ciprofloxacin (CIPRO) IVPB 400 mg        400 mg 200 mL/hr over 60 Minutes Intravenous Every 12 hours 08/19/21 1117 08/19/21 1755   08/10/21 1000  ciprofloxacin (CIPRO) IVPB 400 mg  Status:  Discontinued        400 mg 200 mL/hr over 60 Minutes Intravenous Every 12 hours 08/10/21 0824 08/19/21 1117   08/10/21 0915  metroNIDAZOLE (FLAGYL) IVPB 500 mg  Status:  Discontinued        500 mg 100 mL/hr over 60 Minutes Intravenous Every 8 hours 08/10/21 0824 08/19/21 1117      Subjective: No chest pain, no nausea, no vomiting.  NG tube in place.  Patient reports overall abdominal discomfort continues to improve and has noticed increased output in her ostomy.  So far tolerating NG tube to be clamped and without suction.  Objective: Vitals:   08/21/21 1448 08/21/21 2101 08/22/21 0513 08/22/21 1329  BP: 106/77 109/79 119/84 110/79  Pulse: (!) 106 (!) 110 (!) 108 (!) 102  Resp: 16 16 17 18   Temp:  99 F (37.2 C) 98.4 F (36.9 C) 98.2 F (36.8 C)  TempSrc:  Oral Oral Oral  SpO2: 96% 96% 98% 95%  Weight:      Height:        Intake/Output Summary (Last 24 hours) at 08/22/2021 1927 Last data filed at 08/22/2021 1159 Gross per 24 hour  Intake  889.26 ml  Output 2400 ml  Net -1510.74 ml   Filed Weights   08/17/21 0545 08/18/21 0500 08/19/21 0500  Weight: 85.6 kg 84.7 kg 83.5 kg    Examination: General exam: Alert, awake, oriented x 3, will like to have NG removed. Respiratory system: Positive scattered rhonchi; no using accessory muscles.  Respiratory effort normal.  Good saturation on room air. Cardiovascular system: Mild sinus tachycardia; no rubs, no gallops, no JVD. Gastrointestinal system: Abdomen is soft, no guarding, positive large parastomal hernia appreciated.  Increase ostomy output. Central nervous system: Alert and oriented. No focal neurological deficits. Extremities: No cyanosis or clubbing. Skin: No petechiae. Psychiatry: Judgement and insight appear normal. Mood & affect appropriate.    Data Reviewed: I have personally reviewed following labs and imaging studies.  CBC: Recent Labs  Lab 08/16/21 0351 08/17/21 0700 08/18/21 0221 08/20/21 0457  WBC 16.7* 19.2* 20.2* 9.9  NEUTROABS  --   --  16.0*  --   HGB 11.1* 12.1 9.6* 8.3*  HCT 34.3* 38.5 28.8* 26.9*  MCV 97.2 98.5 94.1 100.0  PLT 559* 652* 607* 676*   Basic Metabolic Panel: Recent Labs  Lab 08/17/21 0700 08/18/21 0221 08/19/21 0405 08/20/21 0715 08/20/21 0838 08/21/21 0626 08/22/21 0700  NA 134* 135 139 131* 139 138 135  K  3.4* 3.7 2.7* >7.5* 3.2* 3.8 3.9  CL 102 104 107 100 107 100 99  CO2 19* 21* 25 24 28 31 30   GLUCOSE 107* 87 110* 965* 163* 142* 120*  BUN 17 19 13 7 7 6 7   CREATININE 1.36* 0.92 0.56 0.62 0.46 0.39* 0.33*  CALCIUM 8.6* 8.1* 7.7* 7.9* 7.9* 8.3* 8.1*  MG 3.1* 2.8* 2.4  --   --   --  1.5*  PHOS  --  3.7 3.4  --   --   --  3.1   GFR: Estimated Creatinine Clearance: 78.6 mL/min (A) (by C-G formula based on SCr of 0.33 mg/dL (L)).  Liver Function Tests: Recent Labs  Lab 08/17/21 0700 08/19/21 0405 08/22/21 0700  AST 25 17 17   ALT 13 10 9   ALKPHOS 112 82 64  BILITOT 0.9 0.8 0.1*  PROT 7.2 5.5* 5.8*   ALBUMIN 2.7* 1.9* 1.8*   Radiology Studies: DG Abd 1 View  Result Date: 08/22/2021 CLINICAL DATA:  Small-bowel obstruction, history of colostomy EXAM: ABDOMEN - 1 VIEW COMPARISON:  Portable exam 0710 hours compared to 08/19/2021 FINDINGS: Persistent small bowel dilatation little changed. Paucity of colonic gas. No bowel wall thickening, or urinary tract calcification, or acute osseous findings. IMPRESSION: Persistent small bowel dilatation consistent with obstruction. Electronically Signed   By: Lavonia Dana M.D.   On: 08/22/2021 08:50     Scheduled Meds:  chlorhexidine  15 mL Mouth Rinse BID   Chlorhexidine Gluconate Cloth  6 each Topical Daily   heparin  5,000 Units Subcutaneous Q8H   insulin aspart  0-15 Units Subcutaneous Q6H   mouth rinse  15 mL Mouth Rinse q12n4p   metoprolol tartrate  2.5 mg Intravenous Q6H   pantoprazole (PROTONIX) IV  40 mg Intravenous Q12H   sodium chloride flush  10-40 mL Intracatheter Q12H   Continuous Infusions:  methocarbamol (ROBAXIN) IV Stopped (08/18/21 0730)   TPN ADULT (ION) 80 mL/hr at 08/22/21 1808     LOS: 11 days    Barton Dubois, MD [facility]  If 7PM-7AM, please contact night-coverage [email or web address] 08/22/2021, 7:27 PM

## 2021-08-22 NOTE — Progress Notes (Signed)
Nutrition Follow-up  DOCUMENTATION CODES:   Non-severe (moderate) malnutrition in context of acute illness/injury, Obesity unspecified  INTERVENTION:  - Continue TPN  NUTRITION DIAGNOSIS:   Moderate Malnutrition related to acute illness (dehydration, necrotizing faciitis, diverting colostomy) as evidenced by energy intake < or equal to 50% for > or equal to 5 days, mild muscle depletion.  Ongoing  GOAL:   Patient will meet greater than or equal to 90% of their needs  Goal met- receiving nutrition via TPN  MONITOR:   PO intake, Supplement acceptance, Labs, Weight trends, I & O's  REASON FOR ASSESSMENT:   Malnutrition Screening Tool    ASSESSMENT:   Patient is a 51 yo hx of necrotizing fascitis s/p diverting colostomy. Presents with electrolyte disturbance, dehydration.  2/2: TPN initiated. No immediate surgery planned (pSBO). Continue NGT and NPO per MD 2/3: Patient  receiving TPN with Goal rate- 80 mL/hr (provides 1667 kcals, 92 gr protein per day). NG output 4.6 liters 2/6: TPN continues at goal rate of 3m/hr  Per Surgery, parastomal hernia and SBO improving. NG tube with clamping sessions yesterday. KUB with continued dilated loop. Plans for SBFT with radiology to determine obstruction vs ileus. Pt is a poor surgical candidate for SBO. Recommend pt to improve nutrition and get full reversal and hernia repair.   Pt reports feeling hungry and wants to eat. Will continue to follow and adjust nutrition recommendations as appropriate.  Current weight: 83.5 kg  Medications: SSI, protonix  Labs: sodium 135 (WNL), potassium 3.9 (WNL), Cr 0.33 (L), phos 3.1(WNL), Mg 1.5(L-replacing), CBG's 113-138 x24 hours  NGT output: 1L x12 hours  Colostomy output: 1.6L x24 hours  Diet Order:   Diet Order             Diet NPO time specified Except for: Ice Chips  Diet effective now                   EDUCATION NEEDS:   Education needs have been addressed  Skin:  Skin  Assessment: Reviewed RN Assessment  Last BM:  2/5 via colostomy (1.6L output)  Height:   Ht Readings from Last 1 Encounters:  08/10/21 4' 10" (1.473 m)    Weight:   Wt Readings from Last 1 Encounters:  08/19/21 83.5 kg    Ideal Body Weight:  43.6 kg  BMI:  Body mass index is 38.47 kg/m.  Estimated Nutritional Needs:   Kcal:  1500-1700  Protein:  90-99 gr  Fluid:  >1700 ml daily  AClayborne Dana RDN, LDN Clinical Nutrition

## 2021-08-22 NOTE — Progress Notes (Signed)
[facility] Progress Note     Subjective: Did not have the binder in place, patient had refused. I cut the hole for the ostomy and placed binder. KUB with continued dilated loop. Has output from ostomy and NG is down, patient has lemonade on bedside table and no bile in the NG. I think some of this is her intake in the canister.   Objective: Vital signs in last 24 hours: Temp:  [98.4 F (36.9 C)-99 F (37.2 C)] 98.4 F (36.9 C) (02/06 0513) Pulse Rate:  [106-110] 108 (02/06 0513) Resp:  [16-17] 17 (02/06 0513) BP: (106-119)/(71-84) 119/84 (02/06 0513) SpO2:  [96 %-98 %] 98 % (02/06 0513) Last BM Date: 08/21/21  Intake/Output from previous day: 02/05 0701 - 02/06 0700 In: 649.3 [I.V.:649.3] Out: 3102 [Urine:500; Emesis/NG output:1000; Stool:1602] Intake/Output this shift: Total I/O In: 240 [P.O.:240] Out: 500 [Stool:500]  General appearance: alert and no distress Resp: normal work of breathing GI: soft, nondistended, parastomal hernia, ostomy with stool and pink, reducible hernia, nontender  Lab Results:  Recent Labs    08/20/21 0457  WBC 9.9  HGB 8.3*  HCT 26.9*  PLT 515*   BMET Recent Labs    08/21/21 0626 08/22/21 0700  NA 138 135  K 3.8 3.9  CL 100 99  CO2 31 30  GLUCOSE 142* 120*  BUN 6 7  CREATININE 0.39* 0.33*  CALCIUM 8.3* 8.1*   PT/INR No results for input(s): LABPROT, INR in the last 72 hours.  Studies/Results: DG Abd 1 View  Result Date: 08/22/2021 CLINICAL DATA:  Small-bowel obstruction, history of colostomy EXAM: ABDOMEN - 1 VIEW COMPARISON:  Portable exam 0710 hours compared to 08/19/2021 FINDINGS: Persistent small bowel dilatation little changed. Paucity of colonic gas. No bowel wall thickening, or urinary tract calcification, or acute osseous findings. IMPRESSION: Persistent small bowel dilatation consistent with obstruction. Electronically Signed   By: Lavonia Dana M.D.   On: 08/22/2021 08:50     Anti-infectives: Anti-infectives (From admission, onward)    Start     Dose/Rate Route Frequency Ordered Stop   08/19/21 1800  metroNIDAZOLE (FLAGYL) IVPB 500 mg        500 mg 100 mL/hr over 60 Minutes Intravenous Every 8 hours 08/19/21 1117 08/19/21 1900   08/19/21 1700  ciprofloxacin (CIPRO) IVPB 400 mg        400 mg 200 mL/hr over 60 Minutes Intravenous Every 12 hours 08/19/21 1117 08/19/21 1755   08/10/21 1000  ciprofloxacin (CIPRO) IVPB 400 mg  Status:  Discontinued        400 mg 200 mL/hr over 60 Minutes Intravenous Every 12 hours 08/10/21 0824 08/19/21 1117   08/10/21 0915  metroNIDAZOLE (FLAGYL) IVPB 500 mg  Status:  Discontinued        500 mg 100 mL/hr over 60 Minutes Intravenous Every 8 hours 08/10/21 0824 08/19/21 1117       Assessment/Plan: Patient with improving SBO in a parastomal hernia. Making output and NG is very gastric like/ lemonade like.  SBFT with radiology, discussed with radiologist and that the hernia reduces easily. Want to see if still obstructed or just ileus related to everything else she has going on Poor surgical candidate due to nutrition, wants to be reversed, has a large parastomal hernia and ultimately would be better for her improve nutrition and get the full reversal and hernia repair instead of going in for a SBO right now   Will see what SBFT shows. Updated team.  Ambulate  LOS: 11 days    Virl Cagey 08/22/2021

## 2021-08-22 NOTE — Progress Notes (Signed)
[facility]  Contrast in the SBFT has moved through most of the small bowel but not into the colon yet will finish the exam in AM. It appears that it  has moved passed the hernia area.   NG off suction for now and if gets nauseated can go back on suction  if needed.   Updated team.   Curlene Labrum, MD [facility] [address] [place], [place] [address] [phone] (office)

## 2021-08-23 LAB — BASIC METABOLIC PANEL
Anion gap: 7 (ref 5–15)
BUN: 7 mg/dL (ref 6–20)
CO2: 30 mmol/L (ref 22–32)
Calcium: 8.2 mg/dL — ABNORMAL LOW (ref 8.9–10.3)
Chloride: 95 mmol/L — ABNORMAL LOW (ref 98–111)
Creatinine, Ser: 0.3 mg/dL — ABNORMAL LOW (ref 0.44–1.00)
GFR, Estimated: >60 mL/min (ref >60)
Glucose, Bld: 134 mg/dL — ABNORMAL HIGH (ref 70–99)
Potassium: 4.2 mmol/L (ref 3.5–5.1)
Sodium: 132 mmol/L — ABNORMAL LOW (ref 135–145)

## 2021-08-23 LAB — GLUCOSE, CAPILLARY
Glucose-Capillary: 110 mg/dL — ABNORMAL HIGH (ref 70–99)
Glucose-Capillary: 137 mg/dL — ABNORMAL HIGH (ref 70–99)
Glucose-Capillary: 127 mg/dL — ABNORMAL HIGH (ref 70–99)
Glucose-Capillary: 125 mg/dL — ABNORMAL HIGH (ref 70–99)
Glucose-Capillary: 148 mg/dL — ABNORMAL HIGH (ref 70–99)

## 2021-08-23 LAB — MAGNESIUM: Magnesium: 1.6 mg/dL — ABNORMAL LOW (ref 1.7–2.4)

## 2021-08-23 MED ORDER — SODIUM CHLORIDE 0.9 % IV BOLUS
500.0000 mL | Freq: Once | INTRAVENOUS | Status: AC
  Administered 2021-08-23: 500 mL via INTRAVENOUS

## 2021-08-23 MED ORDER — TRAVASOL 10 % IV SOLN
INTRAVENOUS | Status: AC
  Filled 2021-08-23: qty 921.6

## 2021-08-23 MED ORDER — INSULIN ASPART 100 UNIT/ML IJ SOLN
0.0000 [IU] | Freq: Three times a day (TID) | INTRAMUSCULAR | Status: DC
  Administered 2021-08-23 (×2): 2 [IU] via SUBCUTANEOUS

## 2021-08-23 NOTE — Progress Notes (Signed)
[facility] Progress Note     Subjective: SBFT with partial SBO as suspected but improving. Patient's output has bene improving all along. She does better if OOB and in chair or walking. Gastric contents did not move from yesterday and this is partially likely from lying in bed.   Objective: Vital signs in last 24 hours: Temp:  [98.1 F (36.7 C)-98.6 F (37 C)] 98.1 F (36.7 C) (02/07 1240) Pulse Rate:  [99-109] 99 (02/07 1240) Resp:  [14-18] 18 (02/07 1240) BP: (100-122)/(72-92) 100/72 (02/07 1240) SpO2:  [94 %-98 %] 98 % (02/07 1240) Last BM Date: 08/23/21  Intake/Output from previous day: 02/06 0701 - 02/07 0700 In: 1450.5 [P.O.:580; I.V.:870.5] Out: 1800 [Urine:700; Emesis/NG output:400; Stool:700] Intake/Output this shift: Total I/O In: 1484.1 [P.O.:720; I.V.:764.1] Out: 1050 [Urine:1000; Stool:50]  General appearance: alert and no distress GI: soft, reducible hernia, ostomy with pink tissue and liquid stool in bag  Lab Results:  No results for input(s): WBC, HGB, HCT, PLT in the last 72 hours. BMET Recent Labs    08/22/21 0700 08/23/21 0817  NA 135 132*  K 3.9 4.2  CL 99 95*  CO2 30 30  GLUCOSE 120* 134*  BUN 7 7  CREATININE 0.33* 0.30*  CALCIUM 8.1* 8.2*   PT/INR No results for input(s): LABPROT, INR in the last 72 hours.  Studies/Results: DG Abd 1 View  Result Date: 08/22/2021 CLINICAL DATA:  Small-bowel obstruction, history of colostomy EXAM: ABDOMEN - 1 VIEW COMPARISON:  Portable exam 0710 hours compared to 08/19/2021 FINDINGS: Persistent small bowel dilatation little changed. Paucity of colonic gas. No bowel wall thickening, or urinary tract calcification, or acute osseous findings. IMPRESSION: Persistent small bowel dilatation consistent with obstruction. Electronically Signed   By: Lavonia Dana M.D.   On: 08/22/2021 08:50   DG SMALL BOWEL W DOUBLE CM (HD)  Result Date: 08/23/2021 CLINICAL DATA:  Small-bowel obstruction, has loop  colostomy in LEFT lower quadrant with parastomal herniation of small bowel EXAM: SMALL BOWEL SERIES COMPARISON:  Abdominal radiograph 08/22/2021, CT abdomen and pelvis 02/13/2022 TECHNIQUE: Following ingestion of thin barium, serial small bowel images were obtained including spot views of the terminal ileum. Images obtained: 7, over 16 hours FINDINGS: Poor emptying of tracer from the stomach, potentially related to patient being supine for prolonged periods. Proximal small bowel loops normal appearance. Dilated small bowel loops are seen in the hernia sac adjacent to the colostomy. However contrast passes beyond these into distal small bowel. Several of the small bowel loops immediately proximal to the hernia sac are significantly dilated however the course of the exam. However contrast passes beyond the parastomal herniation and into the RIGHT colon, which is decompressed. Findings are consistent with a partial small bowel obstruction at the level of the parastomal hernia. IMPRESSION: Partial small bowel obstruction at the level of the parastomal hernia, with several dilated loops proximal to the hernia as well as within the hernia sac, though contrast is able to pass beyond into nondistended distal small bowel loops and on to the RIGHT colon. Electronically Signed   By: Lavonia Dana M.D.   On: 08/23/2021 09:11    Anti-infectives: Anti-infectives (From admission, onward)    Start     Dose/Rate Route Frequency Ordered Stop   08/19/21 1800  metroNIDAZOLE (FLAGYL) IVPB 500 mg        500 mg 100 mL/hr over 60 Minutes Intravenous Every 8 hours 08/19/21 1117 08/19/21 1900   08/19/21 1700  ciprofloxacin (CIPRO) IVPB 400  mg        400 mg 200 mL/hr over 60 Minutes Intravenous Every 12 hours 08/19/21 1117 08/19/21 1755   08/10/21 1000  ciprofloxacin (CIPRO) IVPB 400 mg  Status:  Discontinued        400 mg 200 mL/hr over 60 Minutes Intravenous Every 12 hours 08/10/21 0824 08/19/21 1117   08/10/21 0915   metroNIDAZOLE (FLAGYL) IVPB 500 mg  Status:  Discontinued        500 mg 100 mL/hr over 60 Minutes Intravenous Every 8 hours 08/10/21 0824 08/19/21 1117       Assessment/Plan: Given improvement and overall ostomy output, NG removed and clear diet ordered. pSBO resolving.  Needs to be OOB to chair all day today Needs to walk  Adv diet as tolerated    LOS: 12 days    Virl Cagey 08/23/2021

## 2021-08-23 NOTE — Progress Notes (Signed)
°   08/23/21 2115  Vitals  Temp 99.5 F (37.5 C)  Temp Source Oral  BP 96/67  MAP (mmHg) 77  BP Location Left Arm  BP Method Automatic  Patient Position (if appropriate) Lying  Pulse Rate (!) 143  Resp 18  Level of Consciousness  Level of Consciousness Alert  MEWS COLOR  MEWS Score Color Red  Oxygen Therapy  SpO2 100 %  O2 Device Room Air  MEWS Score  MEWS Temp 0  MEWS Systolic 1  MEWS Pulse 3  MEWS RR 0  MEWS LOC 0  MEWS Score 4    Patient resting in bed at this time. Denies pain or shortness of breath. Heart rate sustaining in the 140's. MD aware, order placed for 573m normal saline bolus.

## 2021-08-23 NOTE — Progress Notes (Signed)
Notified Dr. Dyann Kief of patient's hr on the tele 122-133. No new orders at this time. Monitor.

## 2021-08-23 NOTE — Progress Notes (Signed)
PROGRESS NOTE    Alyssa Carey  VPX:[id number] DOB: 19-Jan-1971 DOA: 08/09/2021 PCP: Caryl Bis, MD   Brief Narrative:   Alyssa Carey  is a 51 y.o. 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  Patient was admitted with pancolitis and associated dehydration as well as electrolyte abnormalities with hypokalemia and hypomagnesemia in the setting of ongoing high output/diarrhea.  She has been started on ciprofloxacin and Flagyl empirically on 1/25 and GI pathogen panel as well as C. difficile testing has been ordered which have now returned negative.  General surgery had initially evaluated patient on account of the parastomal hernia, but patient was improving and therefore they had signed off.  She now is having some persistent nausea and vomiting along with abdominal pain and repeat CT imaging on 1/31 demonstrates high-grade bowel obstruction findings for which general surgery has been reconsulted and has recommended patient to be n.p.o. and to place NG tube.    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 -GI pathogen panel negative and C. difficile testing negative -No recent use of antibiotics -Continue supportive care.  New findings of high-grade small bowel obstruction in the setting of parastomal hernia -Patient is stable to continue conservative management for now. -SBFT demonstrating contrast has moved through most of the small bowel and has been able to pass through hernia process.  -NG tube has been discontinued;  clear liquid diet has been allowed. -Patient advised to increase physical activity and to sit most of the time in the chair to further facilitate bowel peristalsis.  -Based on recent images findings patient now with partial SBO.  -Continue TPN for now -Continue to follow general surgery recommendations.  Dehydration secondary to above -With noted high ostomy output at time of admission, now decreasing oral intake and NG tube usage until 08/22/2021 -NG tube has been removed; diet will be advanced as tolerated starting with clear liquids. -To maintain adequate hydration. -Continue to follow clinical response and provide support. -Continue TPN for now.   Sinus tachycardia -ongoing; but improved.  Currently 110 max HR -Likely related to the dehydration as well as persistent electrolyte abnormalities -No sign of anxiety noted -TSH 0.6 -D-dimer noted to be 5.71, will CT angiogram negative for PE -Check urine tox and echo 1/29 -Started on metoprolol 1/30 after discussion with cardiology continue adjusted dose of metoprolol 25 mg twice a day currently IV as patient unable to tolerate p.o. -Heart rate much improved.   Moderate protein calorie malnutrition -NG tube has been removed today; patient advanced to clear liquid diet.  We will plan to follow advance as tolerated. -Continue TPN management for now. -Follow clinical response and continue supportive care. -So far well-tolerated.   FOBT positive -Continue to monitor hemoglobin trend; no overt bleeding appreciated. -Continue treatment for PPI as mentioned below. -Patient will benefit outpatient follow-up with gastroenterology service.   History of PUD/alcoholic gastritis -Continue the use of PPI; still given IV due to ongoing n.p.o. status.   Alcoholic liver disease -Continue CIWA protocol -No active withdrawal symptoms appreciated currently. -Cessation counseling  provided. -Continue to follow LFTs.  Leukocytosis -In the setting of  pancolitis (and chemo concentration from severe dehydration. -Continue current antibiotic therapy and fluid resuscitation -WBCs 9.9; patient remains afebrile. -Continue to follow trend intermittently. -No signs of acute infection appreciated currently.  Hypokalemia -In the setting of GI losses and NG tube -Continue to follow electrolytes and further replete as needed. -Patient also receiving TPN at this time. -Potassium 4.2 currently  Hypomagnesemia -Magnesium 1.6 -Continue repletion as needed and follow trend.     DVT prophylaxis: Heparin Code Status: Full Family Communication: None at bedside Disposition Plan:  Status is: Inpatient   Remains inpatient appropriate because: Continuous need for IV fluids/TPN management; NG tube has just been removed today with initiation to advance diet.      Consultants:  General surgery   Procedures:  See below  Antimicrobials:  Anti-infectives (From admission, onward)    Start     Dose/Rate Route Frequency Ordered Stop   08/19/21 1800  metroNIDAZOLE (FLAGYL) IVPB 500 mg        500 mg 100 mL/hr over 60 Minutes Intravenous Every 8 hours 08/19/21 1117 08/19/21 1900   08/19/21 1700  ciprofloxacin (CIPRO) IVPB 400 mg        400 mg 200 mL/hr over 60 Minutes Intravenous Every 12 hours 08/19/21 1117 08/19/21 1755   08/10/21 1000  ciprofloxacin (CIPRO) IVPB 400 mg  Status:  Discontinued        400 mg 200 mL/hr over 60 Minutes Intravenous Every 12 hours 08/10/21 0824 08/19/21 1117   08/10/21 0915  metroNIDAZOLE (FLAGYL) IVPB 500 mg  Status:  Discontinued        500 mg 100 mL/hr over 60 Minutes Intravenous Every 8 hours 08/10/21 0824 08/19/21 1117       Subjective: No chest pain, no nausea, no vomiting, NG tube has been removed; patient expresses feeling better overall.  Patient is afebrile.    Objective: Vitals:   08/22/21 2016 08/22/21 2300 08/23/21 0346 08/23/21 1240  BP:  (!) 122/92 107/75 100/72  Pulse: (!) 101  (!) 109 99   Resp:  18 14 18   Temp:  98.4 F (36.9 C) 98.6 F (37 C) 98.1 F (36.7 C)  TempSrc:  Oral Oral Oral  SpO2: 94% 98% 98% 98%  Weight:      Height:        Intake/Output Summary (Last 24 hours) at 08/23/2021 1652 Last data filed at 08/23/2021 1500 Gross per 24 hour  Intake 2694.51 ml  Output 2350 ml  Net 344.51 ml   Filed Weights   08/17/21 0545 08/18/21 0500 08/19/21 0500  Weight: 85.6 kg 84.7 kg 83.5 kg    Examination: General exam: Alert, awake, oriented x 3, NG tube has been removed; no chest pain, reports no nausea or vomiting.  Overall increasing in her ostomy output has been reported.  No fever Respiratory system: Clear to auscultation. Respiratory effort normal.  No using accessory muscles.  Good saturation on room air Cardiovascular system: Regular rate. No murmurs, rubs, gallops.  No JVD. Gastrointestinal system: Abdomen is nondistended, soft, no guarding, large parastomal reducible hernia appreciated; ostomy with pink tissue and increased liquid stool output. Central nervous system: Alert and oriented. No focal neurological deficits. Extremities: No cyanosis, clubbing or edema. Skin: No petechiae. Psychiatry: Judgement and insight appear normal. Mood & affect appropriate.   Data Reviewed: I have personally reviewed following labs and imaging studies.  CBC: Recent Labs  Lab 08/17/21 0700 08/18/21 0221 08/20/21 0457  WBC 19.2* 20.2* 9.9  NEUTROABS  --  16.0*  --   HGB 12.1 9.6* 8.3*  HCT 38.5 28.8* 26.9*  MCV 98.5 94.1 100.0  PLT 652* 607* 824*   Basic Metabolic Panel: Recent Labs  Lab 08/17/21 0700 08/18/21 0221 08/19/21 0405 08/20/21 0715 08/20/21 0838 08/21/21 0626 08/22/21 0700 08/23/21 0817  NA 134* 135 139 131* 139 138 135 132*  K 3.4* 3.7 2.7* >7.5* 3.2* 3.8 3.9 4.2  CL 102 104 107 100 107 100 99 95*  CO2 19* 21* 25 24 28 31 30 30   GLUCOSE 107* 87 110* 965* 163* 142* 120* 134*  BUN 17 19 13 7 7 6 7 7   CREATININE 1.36* 0.92 0.56 0.62 0.46 0.39*  0.33* 0.30*  CALCIUM 8.6* 8.1* 7.7* 7.9* 7.9* 8.3* 8.1* 8.2*  MG 3.1* 2.8* 2.4  --   --   --  1.5* 1.6*  PHOS  --  3.7 3.4  --   --   --  3.1  --    GFR: Estimated Creatinine Clearance: 78.6 mL/min (A) (by C-G formula based on SCr of 0.3 mg/dL (L)).  Liver Function Tests: Recent Labs  Lab 08/17/21 0700 08/19/21 0405 08/22/21 0700  AST 25 17 17   ALT 13 10 9   ALKPHOS 112 82 64  BILITOT 0.9 0.8 0.1*  PROT 7.2 5.5* 5.8*  ALBUMIN 2.7* 1.9* 1.8*   Radiology Studies: DG Abd 1 View  Result Date: 08/22/2021 CLINICAL DATA:  Small-bowel obstruction, history of colostomy EXAM: ABDOMEN - 1 VIEW COMPARISON:  Portable exam 0710 hours compared to 08/19/2021 FINDINGS: Persistent small bowel dilatation little changed. Paucity of colonic gas. No bowel wall thickening, or urinary tract calcification, or acute osseous findings. IMPRESSION: Persistent small bowel dilatation consistent with obstruction. Electronically Signed   By: Lavonia Dana M.D.   On: 08/22/2021 08:50   DG SMALL BOWEL W DOUBLE CM (HD)  Result Date: 08/23/2021 CLINICAL DATA:  Small-bowel obstruction, has loop colostomy in LEFT lower quadrant with parastomal herniation of small bowel EXAM: SMALL BOWEL SERIES COMPARISON:  Abdominal radiograph 08/22/2021, CT abdomen and pelvis 02/13/2022 TECHNIQUE: Following ingestion of thin barium, serial small bowel images were obtained including spot views of the terminal ileum. Images obtained: 7, over 16 hours FINDINGS: Poor emptying of tracer from the stomach, potentially related to patient being supine for prolonged periods. Proximal small bowel loops normal appearance. Dilated small bowel loops are seen in the hernia sac adjacent to the colostomy. However contrast passes beyond these into distal small bowel. Several of the small bowel loops immediately proximal to the hernia sac are significantly dilated however the course of the exam. However contrast passes beyond the parastomal herniation and into the  RIGHT colon, which is decompressed. Findings are consistent with a partial small bowel obstruction at the level of the parastomal hernia. IMPRESSION: Partial small bowel obstruction at the level of the parastomal hernia, with several dilated loops proximal to the hernia as well as within the hernia sac, though contrast is able to pass beyond into nondistended distal small bowel loops and on to the RIGHT colon. Electronically Signed   By: Lavonia Dana M.D.   On: 08/23/2021 09:11     Scheduled Meds:  chlorhexidine  15 mL Mouth Rinse BID   Chlorhexidine Gluconate Cloth  6 each Topical Daily   heparin  5,000 Units Subcutaneous Q8H   insulin aspart  0-15 Units Subcutaneous Q8H   mouth rinse  15 mL Mouth Rinse q12n4p   metoprolol  tartrate  2.5 mg Intravenous Q6H   pantoprazole (PROTONIX) IV  40 mg Intravenous Q12H   sodium chloride flush  10-40 mL Intracatheter Q12H   Continuous Infusions:  methocarbamol (ROBAXIN) IV Stopped (08/18/21 0730)   TPN ADULT (ION) 80 mL/hr at 08/23/21 0500   TPN ADULT (ION)       LOS: 12 days    Barton Dubois, MD [facility]  If 7PM-7AM, please contact night-coverage [email or web address] 08/23/2021, 4:52 PM

## 2021-08-23 NOTE — Progress Notes (Signed)
Patient sat up with her uncle for two hours while they visited and talked. She is currently back in the bed, with mild pain to stoma site.

## 2021-08-23 NOTE — Progress Notes (Signed)
°   08/23/21 1942  Vitals  Temp (!) 100.7 F (38.2 C)  Temp Source Oral  BP 115/77  MAP (mmHg) 89  BP Location Left Arm  BP Method Automatic  Patient Position (if appropriate) Lying  Pulse Rate (!) 160  Pulse Rate Source Monitor  Resp 20  Level of Consciousness  Level of Consciousness Alert  MEWS COLOR  MEWS Score Color Red  Oxygen Therapy  SpO2 96 %  O2 Device Room Air  Pain Assessment  Pain Scale 0-10  Pain Score 0  MEWS Score  MEWS Temp 1  MEWS Systolic 0  MEWS Pulse 3  MEWS RR 0  MEWS LOC 0  MEWS Score 4  Provider Notification  Provider Name/Title Dr. Clearence Ped  Date Provider Notified 08/23/21  Time Provider Notified 1945  Notification Type Page  Notification Reason Other (Comment) (Heart rate staying 160's)  Provider response At bedside  Date of Provider Response 08/23/21  Time of Provider Response 1946  Rapid Response Notification  Date Rapid Response Notified 08/23/21  Time Rapid Response Notified 1945   Dr Clearence Ped at bedside. Verbal to give 42m of Metoprolol IV.

## 2021-08-23 NOTE — Progress Notes (Addendum)
PHARMACY - TOTAL PARENTERAL NUTRITION CONSULT NOTE   Indication: Small bowel obstruction  Patient Measurements: Height: 4' 10"  (147.3 cm) Weight: 83.5 kg (184 lb 1.4 oz) IBW/kg (Calculated) : 40.9 TPN AdjBW (KG): 49 Body mass index is 38.47 kg/m.  Assessment:  51 yo female with medical history significant for large parastomal hernia and recent colitis with high output from her ostomy now with concern for SBO. Repeat CT 01/31 verified partial bowel obstruction. Patient remains NPO. Pharmacy consulted to initiate TPN. Patient tolerating TPN. NG output going down  Glucose / Insulin:  120-138, 4 units insulin Electrolytes: Na 132 mag 1.6 WNL for others Renal: Scr 0.56 Hepatic: WNL   Central access: PICC 2/1 TPN start date: 2/2  Nutritional Goals: Goal TPN rate is 80 mL/hr (provides 92 g of protein and 1667 kcals per day)  RD Assessment: Estimated Needs Total Energy Estimated Needs: 1500-1700 Total Protein Estimated Needs: 90-99 gr Total Fluid Estimated Needs: >1700 ml daily  Current Nutrition:  NPO  Plan:  Continue TPN to 54m/hr at 1800 Electrolytes in TPN: Na 648m/L, K 6025mL, Ca 5mE61m, Mg 10mE32m and Phos 15mmo64m Cl:Ac 1:2 Add standard MVI, thiamine, folic acid, and trace elements to TPN  Moderate q8h SSI and adjust as needed  Monitor TPN labs on Mon/Thurs,   StevenMargot AblesmD Clinical Pharmacist 08/23/2021 9:03 AM

## 2021-08-23 NOTE — Progress Notes (Signed)
Patient's ostomy changed as was leaking. Patient tolerated well.

## 2021-08-23 NOTE — Plan of Care (Signed)
Ng tube maintained clamped. No episode of nausea or vomiting all night. Continued to monitor.

## 2021-08-24 ENCOUNTER — Encounter (HOSPITAL_COMMUNITY): Payer: Self-pay | Admitting: Internal Medicine

## 2021-08-24 ENCOUNTER — Inpatient Hospital Stay (HOSPITAL_COMMUNITY): Payer: Self-pay

## 2021-08-24 DIAGNOSIS — I471 Supraventricular tachycardia: Secondary | ICD-10-CM

## 2021-08-24 DIAGNOSIS — I4892 Unspecified atrial flutter: Secondary | ICD-10-CM

## 2021-08-24 DIAGNOSIS — D649 Anemia, unspecified: Secondary | ICD-10-CM

## 2021-08-24 LAB — URINALYSIS, COMPLETE (UACMP) WITH MICROSCOPIC
Bacteria, UA: NONE SEEN
Bilirubin Urine: NEGATIVE
Glucose, UA: NEGATIVE mg/dL
Hgb urine dipstick: NEGATIVE
Ketones, ur: NEGATIVE mg/dL
Leukocytes,Ua: NEGATIVE
Nitrite: NEGATIVE
Protein, ur: NEGATIVE mg/dL
Specific Gravity, Urine: 1.013 (ref 1.005–1.030)
pH: 7 (ref 5.0–8.0)

## 2021-08-24 LAB — CBC
HCT: 26 % — ABNORMAL LOW (ref 36.0–46.0)
Hemoglobin: 8.7 g/dL — ABNORMAL LOW (ref 12.0–15.0)
MCH: 31.4 pg (ref 26.0–34.0)
MCHC: 33.5 g/dL (ref 30.0–36.0)
MCV: 93.9 fL (ref 80.0–100.0)
Platelets: 376 10*3/uL (ref 150–400)
RBC: 2.77 MIL/uL — ABNORMAL LOW (ref 3.87–5.11)
RDW: 17.2 % — ABNORMAL HIGH (ref 11.5–15.5)
WBC: 19.3 10*3/uL — ABNORMAL HIGH (ref 4.0–10.5)
nRBC: 0 % (ref 0.0–0.2)

## 2021-08-24 LAB — COMPREHENSIVE METABOLIC PANEL
ALT: 15 U/L (ref 0–44)
AST: 35 U/L (ref 15–41)
Albumin: 2 g/dL — ABNORMAL LOW (ref 3.5–5.0)
Alkaline Phosphatase: 176 U/L — ABNORMAL HIGH (ref 38–126)
Anion gap: 5 (ref 5–15)
BUN: 7 mg/dL (ref 6–20)
CO2: 26 mmol/L (ref 22–32)
Calcium: 8.1 mg/dL — ABNORMAL LOW (ref 8.9–10.3)
Chloride: 104 mmol/L (ref 98–111)
Creatinine, Ser: <0.3 mg/dL — ABNORMAL LOW (ref 0.44–1.00)
Glucose, Bld: 110 mg/dL — ABNORMAL HIGH (ref 70–99)
Potassium: 4 mmol/L (ref 3.5–5.1)
Sodium: 135 mmol/L (ref 135–145)
Total Bilirubin: 0.5 mg/dL (ref 0.3–1.2)
Total Protein: 6.1 g/dL — ABNORMAL LOW (ref 6.5–8.1)

## 2021-08-24 LAB — PROCALCITONIN: Procalcitonin: 10.54 ng/mL

## 2021-08-24 LAB — GLUCOSE, CAPILLARY
Glucose-Capillary: 105 mg/dL — ABNORMAL HIGH (ref 70–99)
Glucose-Capillary: 109 mg/dL — ABNORMAL HIGH (ref 70–99)
Glucose-Capillary: 118 mg/dL — ABNORMAL HIGH (ref 70–99)
Glucose-Capillary: 109 mg/dL — ABNORMAL HIGH (ref 70–99)
Glucose-Capillary: 136 mg/dL — ABNORMAL HIGH (ref 70–99)
Glucose-Capillary: 107 mg/dL — ABNORMAL HIGH (ref 70–99)

## 2021-08-24 LAB — PREALBUMIN: Prealbumin: 10.1 mg/dL — ABNORMAL LOW (ref 18–38)

## 2021-08-24 LAB — MAGNESIUM: Magnesium: 1.8 mg/dL (ref 1.7–2.4)

## 2021-08-24 LAB — LACTIC ACID, PLASMA: Lactic Acid, Venous: 1.5 mmol/L (ref 0.5–1.9)

## 2021-08-24 MED ORDER — TRAVASOL 10 % IV SOLN
INTRAVENOUS | Status: AC
  Filled 2021-08-24: qty 921.6

## 2021-08-24 MED ORDER — ADENOSINE 6 MG/2ML IV SOLN
INTRAVENOUS | Status: AC
  Filled 2021-08-24: qty 2

## 2021-08-24 MED ORDER — BOOST / RESOURCE BREEZE PO LIQD CUSTOM
1.0000 | Freq: Three times a day (TID) | ORAL | Status: DC
  Administered 2021-08-24 – 2021-08-28 (×12): 1 via ORAL

## 2021-08-24 MED ORDER — AMIODARONE LOAD VIA INFUSION
150.0000 mg | Freq: Once | INTRAVENOUS | Status: DC
  Filled 2021-08-24: qty 83.34

## 2021-08-24 MED ORDER — SODIUM CHLORIDE 0.9 % IV BOLUS
500.0000 mL | Freq: Once | INTRAVENOUS | Status: AC
  Administered 2021-08-24: 500 mL via INTRAVENOUS

## 2021-08-24 MED ORDER — ADENOSINE 6 MG/2ML IV SOLN
6.0000 mg | Freq: Once | INTRAVENOUS | Status: AC
  Administered 2021-08-24: 6 mg via INTRAVENOUS
  Filled 2021-08-24: qty 2

## 2021-08-24 MED ORDER — AMIODARONE HCL IN DEXTROSE 360-4.14 MG/200ML-% IV SOLN: 60.0000 mg/h | INTRAVENOUS | Status: DC

## 2021-08-24 MED ORDER — ADENOSINE 6 MG/2ML IV SOLN: 12.0000 mg | Freq: Once | INTRAVENOUS | Status: AC

## 2021-08-24 MED ORDER — AMIODARONE HCL IN DEXTROSE 360-4.14 MG/200ML-% IV SOLN: 30.0000 mg/h | INTRAVENOUS | Status: DC

## 2021-08-24 MED ORDER — ADENOSINE 6 MG/2ML IV SOLN
INTRAVENOUS | Status: AC
  Administered 2021-08-24: 12 mg via INTRAVENOUS
  Filled 2021-08-24: qty 2

## 2021-08-24 MED ORDER — MAGNESIUM SULFATE 2 GM/50ML IV SOLN
2.0000 g | Freq: Once | INTRAVENOUS | Status: AC
  Administered 2021-08-24: 2 g via INTRAVENOUS
  Filled 2021-08-24: qty 50

## 2021-08-24 NOTE — Progress Notes (Signed)
PHARMACY - TOTAL PARENTERAL NUTRITION CONSULT NOTE   Indication: Small bowel obstruction  Patient Measurements: Height: 4' 10"  (147.3 cm) Weight: 75.4 kg (166 lb 3.6 oz) IBW/kg (Calculated) : 40.9 TPN AdjBW (KG): 49 Body mass index is 34.74 kg/m.  Assessment:  51 yo female with medical history significant for large parastomal hernia and recent colitis with high output from her ostomy now with concern for SBO. Repeat CT 01/31 verified partial bowel obstruction. Patient remains NPO. Pharmacy consulted to initiate TPN. Patient tolerating TPN. NG output going down  Glucose / Insulin:  109-148, 6 units insulin Electrolytes: Na 132 mag 1.6 WNL for others Renal: Scr 0.56 Hepatic: WNL   Central access: PICC 2/1 TPN start date: 2/2  Nutritional Goals: Goal TPN rate is 80 mL/hr (provides 92 g of protein and 1667 kcals per day)  RD Assessment: Estimated Needs Total Energy Estimated Needs: 1500-1700 Total Protein Estimated Needs: 90-99 gr Total Fluid Estimated Needs: >1700 ml daily  Current Nutrition:  NPO  Plan:  Continue TPN to 4m/hr at 1800 Electrolytes in TPN: Na 663m/L, K 6058mL, Ca 5mE31m, Mg 10mE46m and Phos 15mmo43m Cl:Ac 1:2 Add standard MVI, thiamine, folic acid, and trace elements to TPN  Moderate q8h SSI and adjust as needed  Monitor TPN labs on Mon/Thurs,   Alyssa Carey Alyssa Carey Alyssa Carey CCaliforniacal Pharmacist Pager #[phone] 8:53 AM

## 2021-08-24 NOTE — Progress Notes (Addendum)
Nutrition Follow-up  DOCUMENTATION CODES:   Non-severe (moderate) malnutrition in context of acute illness/injury, Obesity unspecified  INTERVENTION:  Request sent to MD: check levels of -Vitamin D and Vitamin C   TPN per pharmacy  Osmolite 1.0 is an isotonic option if pt unable to advance oral intake (it would need to be ordered).  NUTRITION DIAGNOSIS:   Moderate Malnutrition related to acute illness (dehydration, necrotizing faciitis, diverting colostomy) as evidenced by energy intake < or equal to 50% for > or equal to 5 days, mild muscle depletion.  -addressed with TPN  GOAL:   Patient will meet greater than or equal to 90% of their needs  -progressing  MONITOR:   PO intake, Supplement acceptance, Labs, Weight trends, I & O's   ASSESSMENT:  Patient is a 51 yo hx of necrotizing fascitis s/p diverting colostomy. Presents with electrolyte disturbance, dehydration.  Patient at goal rate TPN  -80 ml/hr which is meeting 100% of estimated needs. Provides 1667 kcal, 92 gr protein daily. NGT discontinued and pt says she is feeling better.   Patient tolerating liquid diet and asking for regular foods. Recommend discontinue Ensure or Boost due to hypertonic formula.   2/10- pt diet clear liquids 2/7 advanced to full liquids 2/8 - intake 0% at this time. She is receiving TPN at goal which is meeting 100% of estimated needs. Output reviewed- 1.2 Liters stool x 24 hr (reduced compared to last review).  Medications and weights reviewed.   Intake/Output Summary (Last 24 hours) at 08/26/2021 1005 Last data filed at 08/26/2021 0630 Gross per 24 hour  Intake 1932.61 ml  Output 2875 ml  Net -942.39 ml     Labs: BMP Latest Ref Rng & Units 08/25/2021 08/24/2021 08/23/2021  Glucose 70 - 99 mg/dL 124(H) 110(H) 134(H)  BUN 6 - 20 mg/dL 8 7 7   Creatinine 0.44 - 1.00 mg/dL 0.33(L) <0.30(L) 0.30(L)  BUN/Creat Ratio 6 - 22 (calc) - - -  Sodium 135 - 145 mmol/L 132(L) 135 132(L)  Potassium 3.5  - 5.1 mmol/L 4.8 4.0 4.2  Chloride 98 - 111 mmol/L 101 104 95(L)  CO2 22 - 32 mmol/L 27 26 30   Calcium 8.9 - 10.3 mg/dL 8.4(L) 8.1(L) 8.2(L)    Diet Order:   Diet Order             Diet full liquid Room service appropriate? Yes; Fluid consistency: Thin  Diet effective now                   EDUCATION NEEDS:   Education needs have been addressed  Skin:  Skin Assessment: Reviewed RN Assessment  Last BM:  Output via colostomy- 1.2 liters x 24 hr (improved from 1.45 Liters)    Height:   Ht Readings from Last 1 Encounters:  08/10/21 4' 10"  (1.473 m)    Weight:   Wt Readings from Last 1 Encounters:  08/26/21 75.7 kg    Ideal Body Weight:  43.6 kg  BMI:  Body mass index is 34.88 kg/m.  Estimated Nutritional Needs:   Kcal:  1500-1700  Protein:  90-99 gr  Fluid:  >1700 ml daily   Colman Cater MS,RD,CSG,LDN Contact: Shea Evans

## 2021-08-24 NOTE — Progress Notes (Signed)
Patient transferred to ICU. Report given to Effie Shy, RN.

## 2021-08-24 NOTE — Progress Notes (Signed)
°  Amiodarone Drug - Drug Interaction Consult Note  Recommendations: Monitor QTc. Amiodarone is metabolized by the cytochrome P450 system and therefore has the potential to cause many drug interactions. Amiodarone has an average plasma half-life of 50 days (range 20 to 100 days).   There is potential for drug interactions to occur several weeks or months after stopping treatment and the onset of drug interactions may be slow after initiating amiodarone.   []  Statins: Increased risk of myopathy. Simvastatin- restrict dose to 25m daily. Other statins: counsel patients to report any muscle pain or weakness immediately.  []  Anticoagulants: Amiodarone can increase anticoagulant effect. Consider warfarin dose reduction. Patients should be monitored closely and the dose of anticoagulant altered accordingly, remembering that amiodarone levels take several weeks to stabilize.  []  Antiepileptics: Amiodarone can increase plasma concentration of phenytoin, the dose should be reduced. Note that small changes in phenytoin dose can result in large changes in levels. Monitor patient and counsel on signs of toxicity.  []  Beta blockers: increased risk of bradycardia, AV block and myocardial depression. Sotalol - avoid concomitant use.  []   Calcium channel blockers (diltiazem and verapamil): increased risk of bradycardia, AV block and myocardial depression.  []   Cyclosporine: Amiodarone increases levels of cyclosporine. Reduced dose of cyclosporine is recommended.  []  Digoxin dose should be halved when amiodarone is started.  []  Diuretics: increased risk of cardiotoxicity if hypokalemia occurs.  []  Oral hypoglycemic agents (glyburide, glipizide, glimepiride): increased risk of hypoglycemia. Patient's glucose levels should be monitored closely when initiating amiodarone therapy.   [x]  Drugs that prolong the QT interval:  Torsades de pointes risk may be increased with concurrent use - avoid if possible.   Monitor QTc, also keep magnesium/potassium WNL if concurrent therapy can't be avoided.  Antibiotics: e.g. fluoroquinolones, erythromycin.  Antiarrhythmics: e.g. quinidine, procainamide, disopyramide, sotalol.  Antipsychotics: e.g. phenothiazines, haloperidol.   Lithium, tricyclic antidepressants, and methadone.  VWynona Neat PharmD, BCPS 08/24/2021 1:20 AM

## 2021-08-24 NOTE — Progress Notes (Signed)
PROGRESS NOTE  Alyssa Carey IWL:[id number] DOB: 07-Aug-1970 DOA: 08/09/2021 PCP: Caryl Bis, MD  Brief History:  Alyssa Carey  is a 51 y.o. 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  Patient was admitted with pancolitis and associated dehydration as well as electrolyte abnormalities with hypokalemia and hypomagnesemia in the setting of ongoing high output/diarrhea.  She has been started on ciprofloxacin and Flagyl empirically on 1/25 and GI pathogen panel as well as C. difficile testing has been ordered which have now returned negative.  General surgery had initially evaluated patient on account of the parastomal hernia, but patient was improving and therefore they had signed off.  She now is having some persistent nausea and vomiting along with abdominal pain and repeat CT imaging on 1/31 demonstrates high-grade bowel obstruction findings for which general surgery has been reconsulted and has recommended patient to be n.p.o. and to place NG tube.  Pt began to improve clinically after NG placement with decreased ostomy output and improved abd pain.  NG was subsequently removed 08/23/21 and pt started on clears Pt developed SVT evening of 08/23/21 and was moved to stepdown unit after adenosine 6 mg x2 and given 2 additional doses of IV lopressor.  Assessment/Plan: Pancolitis -resolving -Last day of ciprofloxacin and Flagyl (patient completed 10 days of antibiotic therapy). -GI pathogen panel negative and C. difficile testing negative -No recent use of antibiotics -Continue supportive care.   New findings of high-grade small bowel obstruction in the setting of parastomal hernia -08/15/21 CT abd--high grade SBO -2/7/23SBFT demonstrating contrast has moved through most of the small bowel and has been able to pass through hernia process.  -NG tube has been discontinued;  clear liquid diet  started 2/7 -Patient advised to increase physical activity and to sit most of the time in the chair to further facilitate bowel peristalsis.  -Based on recent images findings patient now with continued partial SBO.  -Continue TPN for now -Continue to follow general surgery recommendations.   Dehydration secondary to above -high ostomy output at time of admission, now decreasing oral intake and NG tube usage until 08/22/2021 -NG tube has been removed; diet will be advanced as tolerated starting with clear liquids. -Continue to follow clinical response and provide support. -Continue TPN for now.   Sinus tachycardia/Paroxsymal SVT -HR had been in 110s due to acute medical illness and volume depletion (high ostomy output) -08/23/21--new SVT up to 160s -08/11/21 TSH 0.632 -D-dimer noted to be 5.71>>>CT angiogram negative for PE -1/29 Echo 50-55%, no WMA, trivial MR, no AS -Started on metoprolol 1/30 after discussion with cardiology   Moderate protein calorie malnutrition -NG tube has been removed today; patient advanced to clear liquid diet.  We will plan to follow advance as tolerated. -Continue TPN management for now. -Follow clinical response and continue supportive care. -So far well-tolerated.   FOBT positive -Continue to monitor hemoglobin trend; no overt bleeding appreciated. -Continue treatment for PPI bid -Patient will benefit outpatient follow-up with gastroenterology service.   History of PUD/alcoholic gastritis -Continue the use of PPI; still given IV due to ongoing n.p.o. status.   Alcoholic liver disease -Continue CIWA protocol -No active withdrawal symptoms appreciated currently. -Cessation counseling provided. -Continue to follow LFTs.   Leukocytosis/Fever -In the setting of pancolitis  -finished 10 days cipro/flagyl on 2/5 -fever 100.7 on evening  2/7 -check blood cultures -UA -PCT -Lactate -CXR   Hypokalemia -In the setting of GI losses and  NG tube -Continue to follow electrolytes and further replete as needed. -Patient also receiving TPN at this time. -Potassium 4.2 currently   Hypomagnesemia -Magnesium 1.6 -Continue repletion as needed and follow trend.       Status is: Inpatient  Remains inpatient appropriate because: remains on TPN, intolerant to po intake, persistent PSBO      Family Communication:   no Family at bedside  Consultants:  general surgery, cardiology  Code Status:  FULL  DVT Prophylaxis:  [place] Heparin    Procedures: As Listed in Progress Note Above  Antibiotics: None       Subjective: Patient developed "closed in" and "warm sensation" last evening with new onset SVT.  Denies cp, sob, n/v/d.  Abd pain overall improving.  Objective: Vitals:   08/24/21 0530 08/24/21 0545 08/24/21 0600 08/24/21 0615  BP: 107/77 113/70 116/81 113/81  Pulse: (!) 123 (!) 115 (!) 119 (!) 117  Resp: (!) 36 (!) 30 (!) 34 (!) 33  Temp:      TempSrc:      SpO2: 92% 98% 99% 99%  Weight:      Height:        Intake/Output Summary (Last 24 hours) at 08/24/2021 0758 Last data filed at 08/24/2021 0741 Gross per 24 hour  Intake 2512.98 ml  Output 3350 ml  Net -837.02 ml   Weight change:  Exam:  General:  Pt is alert, follows commands appropriately, not in acute distress HEENT: No icterus, No thrush, No neck mass, [place]/AT Cardiovascular: RRR, S1/S2, no rubs, no gallops Respiratory: bibasilar crackles. No wheezxe Abdomen: Soft/+BS, non tender, non distended, no guarding Extremities: No edema, No lymphangitis, No petechiae, No rashes, no synovitis   Data Reviewed: I have personally reviewed following labs and imaging studies Basic Metabolic Panel: Recent Labs  Lab 08/18/21 0221 08/19/21 0405 08/20/21 0715 08/20/21 0838 08/21/21 0626 08/22/21 0700 08/23/21 0817  NA 135 139 131* 139 138 135 132*  K 3.7 2.7* >7.5* 3.2* 3.8 3.9 4.2  CL 104 107 100 107 100 99 95*  CO2 21* 25 24 28 31 30 30    GLUCOSE 87 110* 965* 163* 142* 120* 134*  BUN 19 13 7 7 6 7 7   CREATININE 0.92 0.56 0.62 0.46 0.39* 0.33* 0.30*  CALCIUM 8.1* 7.7* 7.9* 7.9* 8.3* 8.1* 8.2*  MG 2.8* 2.4  --   --   --  1.5* 1.6*  PHOS 3.7 3.4  --   --   --  3.1  --    Liver Function Tests: Recent Labs  Lab 08/19/21 0405 08/22/21 0700  AST 17 17  ALT 10 9  ALKPHOS 82 64  BILITOT 0.8 0.1*  PROT 5.5* 5.8*  ALBUMIN 1.9* 1.8*   No results for input(s): LIPASE, AMYLASE in the last 168 hours. No results for input(s): AMMONIA in the last 168 hours. Coagulation Profile: No results for input(s): INR, PROTIME in the last 168 hours. CBC: Recent Labs  Lab 08/18/21 0221 08/20/21 0457 08/24/21 0404  WBC 20.2* 9.9 19.3*  NEUTROABS 16.0*  --   --   HGB 9.6* 8.3* 8.7*  HCT 28.8* 26.9* 26.0*  MCV 94.1 100.0 93.9  PLT 607* 515* 376   Cardiac Enzymes: No results for input(s): CKTOTAL, CKMB, CKMBINDEX, TROPONINI in the last 168 hours. BNP: Invalid input(s): POCBNP CBG: Recent Labs  Lab 08/23/21 0627 08/23/21 1126 08/23/21 1938 08/23/21 2140  08/24/21 0526  GLUCAP 137* 127* 125* 148* 105*   HbA1C: No results for input(s): HGBA1C in the last 72 hours. Urine analysis:    Component Value Date/Time   COLORURINE BROWN (A) 03/23/2021 2000   APPEARANCEUR CLEAR 03/23/2021 2000   LABSPEC 1.025 03/23/2021 2000   PHURINE 6.5 03/23/2021 2000   GLUCOSEU 100 (A) 03/23/2021 2000   HGBUR NEGATIVE 03/23/2021 2000   BILIRUBINUR LARGE (A) 03/23/2021 2000   KETONESUR 15 (A) 03/23/2021 2000   PROTEINUR 30 (A) 03/23/2021 2000   NITRITE POSITIVE (A) 03/23/2021 2000   LEUKOCYTESUR TRACE (A) 03/23/2021 2000   Sepsis Labs: @LABRCNTIP (procalcitonin:4,lacticidven:4) )No results found for this or any previous visit (from the past 240 hour(s)).   Scheduled Meds:  amiodarone  150 mg Intravenous Once   chlorhexidine  15 mL Mouth Rinse BID   Chlorhexidine Gluconate Cloth  6 each Topical Daily   heparin  5,000 Units Subcutaneous  Q8H   insulin aspart  0-15 Units Subcutaneous Q8H   mouth rinse  15 mL Mouth Rinse q12n4p   metoprolol tartrate  2.5 mg Intravenous Q6H   pantoprazole (PROTONIX) IV  40 mg Intravenous Q12H   sodium chloride flush  10-40 mL Intracatheter Q12H   Continuous Infusions:  amiodarone     Followed by   amiodarone     methocarbamol (ROBAXIN) IV Stopped (08/18/21 0730)   TPN ADULT (ION) 80 mL/hr at 08/24/21 0741    Procedures/Studies: DG Chest 1 View  Result Date: 08/17/2021 CLINICAL DATA:  Nasogastric tube placement. EXAM: CHEST  1 VIEW COMPARISON:  04/21/2021.  Chest CTA dated 08/12/2021. FINDINGS: Very poor depth of inspiration with no gross change in mild elevation of the right hemidiaphragm. Mild bibasilar atelectasis. Interval small, oval nodular density in the lateral aspect of the mid right lung. No nodule at that location on the recent CTA. Normal sized heart. Unremarkable bones. IMPRESSION: 1. Very poor inspiration with mild bibasilar atelectasis. 2. Interval small nodular density in the lateral aspect of the mid right lung, not present on the CTA 5 days ago. This can be re-evaluated at the time of follow-up radiographs. Electronically Signed   By: Claudie Revering M.D.   On: 08/17/2021 10:51   DG Abd 1 View  Result Date: 08/24/2021 CLINICAL DATA:  Dehydration, vomiting, small-bowel obstruction, necrotizing fasciitis. Small bowel fluoroscopy exam yesterday. 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 EXAM: ABDOMEN - 1 VIEW COMPARISON:  Small-bowel follow-through 08/22/2021 FINDINGS: Residual contrast material is demonstrated in dilated small bowel with decompressed terminal ileum and contrast seen in the cecum. This is consistent with partial small bowel obstruction. Left lower quadrant ostomy with peristomal hernia suggested. No significant change since prior study. IMPRESSION: Residual contrast material in the small bowel and colon. Changes are consistent with partial small bowel  obstruction. Electronically Signed   By: Lucienne Capers M.D.   On: 08/24/2021 01:47   DG Abd 1 View  Result Date: 08/22/2021 CLINICAL DATA:  Small-bowel obstruction, history of colostomy EXAM: ABDOMEN - 1 VIEW COMPARISON:  Portable exam 0710 hours compared to 08/19/2021 FINDINGS: Persistent small bowel dilatation little changed. Paucity of colonic gas. No bowel wall thickening, or urinary tract calcification, or acute osseous findings. IMPRESSION: Persistent small bowel dilatation consistent with obstruction. Electronically Signed   By: Lavonia Dana M.D.   On: 08/22/2021 08:50   DG Abd 1 View  Result Date: 08/18/2021 CLINICAL DATA:  Large parastomal hernia.  Recurrent colitis. EXAM: ABDOMEN - 1 VIEW COMPARISON:  None. FINDINGS:  Severe gaseous distension of the stomach. Insert gastric tube with the tip projecting over the stomach. Gaseous distension of the small bowel. No bowel dilatation to suggest obstruction. No evidence of pneumoperitoneum, portal venous gas or pneumatosis. No pathologic calcifications along the expected course of the ureters. No acute osseous abnormality. IMPRESSION: 1. Severe gaseous distension of the stomach with a nasogastric tube in place in the stomach. Gaseous distension of the small bowel. Overall findings concerning for persistent small bowel obstruction. Electronically Signed   By: Kathreen Devoid M.D.   On: 08/18/2021 10:03   DG Abd 1 View  Result Date: 08/17/2021 CLINICAL DATA:  Encounter for NG tube placement. Pt admitted for SBO, c/o SOB. Hx of HTN, pt has colostomy. EXAM: ABDOMEN - 1 VIEW COMPARISON:  CT abdomen pelvis 08/16/2021 FINDINGS: Enteric tube coursing below the hemidiaphragm with tip and side port overlying the gastric lumen. The stomach is dilated with gas. No radio-opaque calculi or other significant radiographic abnormality are seen. IMPRESSION: 1. Enteric tube in good position. 2. Obstructive bowel gas pattern again noted. Electronically Signed   By: Iven Finn M.D.   On: 08/17/2021 23:52   CT Angio Chest Pulmonary Embolism (PE) W or WO Contrast  Result Date: 08/12/2021 CLINICAL DATA:  Tachycardia with elevated D-dimer levels. History of hypertension. Clinical concern for pulmonary embolism. EXAM: CT ANGIOGRAPHY CHEST WITH CONTRAST TECHNIQUE: Multidetector CT imaging of the chest was performed using the standard protocol during bolus administration of intravenous contrast. Multiplanar CT image reconstructions and MIPs were obtained to evaluate the vascular anatomy. RADIATION DOSE REDUCTION: This exam was performed according to the departmental dose-optimization program which includes automated exposure control, adjustment of the mA and/or kV according to patient size and/or use of iterative reconstruction technique. CONTRAST:  41m OMNIPAQUE IOHEXOL 350 MG/ML SOLN COMPARISON:  Chest CT 01/13/2020. FINDINGS: Cardiovascular: The pulmonary arteries are suboptimally opacified with contrast due to preferential opacification of the aorta and mild breathing artifact. There is no evidence of acute pulmonary embolism. No acute systemic arterial abnormalities are identified. The heart size is stable. There is no pericardial effusion. Mediastinum/Nodes: There are no enlarged mediastinal, hilar or axillary lymph nodes. The thyroid gland, trachea and esophagus demonstrate no significant findings. Lungs/Pleura: The previously demonstrated bilateral pleural effusions have resolved, and there is improved aeration of both lungs. There is mild residual dependent atelectasis in both lungs. No airspace disease or suspicious pulmonary nodularity identified. Upper abdomen: The liver demonstrates diffusely decreased density consistent with steatosis. No focal abnormality identified. No acute findings are seen within the visualized upper abdomen. Musculoskeletal/Chest wall: There is no chest wall mass or suspicious osseous finding. Generalized soft tissue edema present previously has  improved. Review of the MIP images confirms the above findings. IMPRESSION: 1. No evidence of acute pulmonary embolism or other acute findings within the chest. 2. Interval resolution of previously demonstrated pleural effusions with improved atelectasis in both lungs. 3. Hepatic steatosis. Electronically Signed   By: WRichardean SaleM.D.   On: 08/12/2021 14:23   CT ABDOMEN PELVIS W CONTRAST  Result Date: 08/16/2021 CLINICAL DATA:  Abdominal pain EXAM: CT ABDOMEN AND PELVIS WITH CONTRAST TECHNIQUE: Multidetector CT imaging of the abdomen and pelvis was performed using the standard protocol following bolus administration of intravenous contrast. RADIATION DOSE REDUCTION: This exam was performed according to the departmental dose-optimization program which includes automated exposure control, adjustment of the mA and/or kV according to patient size and/or use of iterative reconstruction technique. CONTRAST:  1048mOMNIPAQUE IOHEXOL  300 MG/ML  SOLN COMPARISON:  08/10/2021 FINDINGS: Lower chest: There are small patchy infiltrates in both lower lobes with interval worsening. Hepatobiliary: There is fatty infiltration in the liver. Gallbladder is unremarkable. Pancreas: There is pancreatic atrophy. No focal abnormality is seen. Spleen: Unremarkable. Adrenals/Urinary Tract: Adrenals are not enlarged. There is no hydronephrosis. There are no renal or ureteral stones. Urinary bladder is not distended. Stomach/Bowel: There is marked distention of stomach. There is fluid in the lumen of visualized lower thoracic esophagus, possibly suggesting reflux. There is abnormal dilation of small-bowel loops. Small bowel loops measure up to 4.8 cm. There is a ventral hernia containing portions of small bowel loops and colon in the left lower quadrant. There is transition in the diameter of small-bowel loops within the hernia. There is mild diffuse wall thickening in the visualized portions of colon. There is liquid stool in the  colon. There is decompression of sigmoid colon and rectum. Vascular/Lymphatic: Unremarkable. Reproductive: Other: There is no ascites or pneumoperitoneum. There is edema in subcutaneous plane in the abdominal wall, more so on the left side without loculated fluid collections. Musculoskeletal: Unremarkable. IMPRESSION: There is interval marked distention of stomach with fluid. There is abnormal dilation of proximal small bowel loops measuring up to 4.8 cm in diameter suggesting high-grade partial obstruction. Zone of transition in the small bowel loops appears to be within a ventral hernia in the left lower quadrant. Partly decompressed distal small bowel loops are seen within the hernial sac in the left lower quadrant. There is wall thickening along with fluid in the lumen in the ascending, transverse and descending colon suggesting colitis. Wall thickening in the colon appears less prominent. There is decompression of sigmoid colon. Findings suggest colitis and partial obstruction of left colon within the hernial sac in the left lower quadrant. There is no hydronephrosis. There are patchy infiltrates in both lower lung fields suggesting atelectasis/pneumonia. There is fluid in the lumen of thoracic esophagus suggesting gastroesophageal reflux. Fatty liver. There is edema in subcutaneous plane, possibly suggesting anasarca. There are no loculated fluid collections in the abdominal wall. Other findings as described in the body of the report. Electronically Signed   By: Elmer Picker M.D.   On: 08/16/2021 12:45   CT ABDOMEN PELVIS W CONTRAST  Result Date: 08/10/2021 CLINICAL DATA:  Nausea vomiting. EXAM: CT ABDOMEN AND PELVIS WITH CONTRAST TECHNIQUE: Multidetector CT imaging of the abdomen and pelvis was performed using the standard protocol following bolus administration of intravenous contrast. RADIATION DOSE REDUCTION: This exam was performed according to the departmental dose-optimization program which  includes automated exposure control, adjustment of the mA and/or kV according to patient size and/or use of iterative reconstruction technique. CONTRAST:  152m OMNIPAQUE IOHEXOL 300 MG/ML  SOLN COMPARISON:  CT abdomen pelvis dated 04/09/2021. FINDINGS: Lower chest: The visualized lung bases are clear. No intra-abdominal free air or free fluid. Hepatobiliary: Diffuse fatty liver. No intrahepatic biliary dilatation. Small gallstone. No pericholecystic fluid or evidence of acute cholecystitis by CT. Pancreas: Unremarkable. No pancreatic ductal dilatation or surrounding inflammatory changes. Spleen: Normal in size without focal abnormality. Adrenals/Urinary Tract: The adrenal glands unremarkable. There is no hydronephrosis on either side. There is symmetric enhancement and excretion of contrast by both kidneys. The visualized ureters and urinary bladder appear unremarkable. 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 There is diffuse inflammatory changes and thickening of the colon consistent with colitis. There is no bowel obstruction. The appendix is normal. Vascular/Lymphatic: The  abdominal aorta and IVC are unremarkable. No portal venous gas. Small scattered mesenteric lymph nodes. No adenopathy. Reproductive: The uterus is anteverted.  No adnexal masses. Other: Anterior pelvic wall incisional scar. Musculoskeletal: No acute osseous pathology. IMPRESSION: 1. Pancolitis. No bowel obstruction. Normal appendix. 2. Fatty liver. 3. Cholelithiasis. Electronically Signed   By: Anner Crete M.D.   On: 08/10/2021 02:25   US Abdomen Limited  Result Date: 08/10/2021 CLINICAL DATA:  Evaluate for abscess EXAM: ULTRASOUND ABDOMEN LIMITED COMPARISON:  None. FINDINGS: Limited ultrasound of the area around the stoma demonstrates normal appearing bowel loops with no evidence of organized fluid collection. IMPRESSION: No evidence abscess. Electronically Signed   By: Yetta Glassman M.D.   On: 08/10/2021 14:34   US Venous Img Upper Uni Right(DVT)  Result Date: 08/17/2021 CLINICAL DATA:  51 year old female with elevated D-dimer, concern for right upper extremity deep vein thrombosis. EXAM: RIGHT UPPER EXTREMITY VENOUS DOPPLER ULTRASOUND TECHNIQUE: Gray-scale sonography with graded compression, as well as color Doppler and duplex ultrasound were performed to evaluate the upper extremity deep venous system from the level of the subclavian vein and including the jugular, axillary, basilic, radial, ulnar and upper cephalic vein. Spectral Doppler was utilized to evaluate flow at rest and with distal augmentation maneuvers. COMPARISON:  None. FINDINGS: Contralateral Subclavian Vein: Respiratory phasicity is normal and symmetric with the symptomatic side. No evidence of thrombus. Normal compressibility. Internal Jugular Vein: No evidence of thrombus. Normal compressibility, respiratory phasicity and response to augmentation. Subclavian Vein: No evidence of thrombus. Normal compressibility, respiratory phasicity and response to augmentation. Axillary Vein: No evidence of thrombus. Normal compressibility, respiratory phasicity and response to augmentation. Cephalic Vein: No evidence of thrombus. Normal compressibility, respiratory phasicity and response to augmentation. Basilic Vein: No evidence of thrombus. Normal compressibility, respiratory phasicity and response to augmentation. Brachial Veins: No evidence of thrombus. Normal compressibility, respiratory phasicity and response to augmentation. Radial Veins: No evidence of thrombus. Normal compressibility, respiratory phasicity and response to augmentation. Ulnar Veins: No evidence of thrombus. Normal compressibility, respiratory phasicity and response to augmentation. Other Findings:  None visualized. IMPRESSION: No evidence of deep vein thrombosis within the right upper extremity. Ruthann Cancer, MD [facility] Electronically Signed   By: Ruthann Cancer M.D.   On: 08/17/2021 07:38   DG CHEST PORT 1 VIEW  Result Date: 08/17/2021 CLINICAL DATA:  NG tube placement. EXAM: PORTABLE CHEST 1 VIEW COMPARISON:  Chest x-ray 08/17/2021. FINDINGS: Enteric tube tip extends into the stomach, distal tip is not included, but is likely beyond the mid stomach. There is gastric dilatation as seen on prior CT. There is left basilar atelectasis. Right-sided central venous catheter tip projects over the distal SVC. Cardiomediastinal silhouette is unchanged, the heart is enlarged. There is no pneumothorax or acute fracture. IMPRESSION: 1. NG tube reaches the mid stomach. The distal tip is not included on this image. 2. Stable dilated stomach. Electronically Signed   By: Ronney Asters M.D.   On: 08/17/2021 23:44   DG Abd Portable 1V  Result Date: 08/19/2021 CLINICAL DATA:  OG tube placement EXAM: PORTABLE ABDOMEN - 1 VIEW COMPARISON:  Plain film of the abdomen dated 08/18/2021. CT abdomen dated 08/16/2021. FINDINGS: The enteric tube is no longer visualized, presumably retracted to the level of the upper chest or beyond. Continued evidence of small-bowel obstruction with dilated gas-filled small bowel loops throughout the central abdomen. No evidence of free intraperitoneal air. IMPRESSION: 1. The enteric tube is no longer  visualized, presumably retracted to the level of the upper chest or beyond the upper chest. Recommend repositioning into the stomach. 2. Continued evidence of small-bowel obstruction. Electronically Signed   By: Franki Cabot M.D.   On: 08/19/2021 13:08   ECHOCARDIOGRAM LIMITED  Result Date: 08/15/2021    ECHOCARDIOGRAM LIMITED REPORT   Patient Name:   Kelleen LISETTE MANCEBO Date of Exam: 08/14/2021 Medical Rec #:  [id number]       Height:       58.0 in Accession #:    [id number]      Weight:       187.4 lb Date of Birth:  12/03/1970       BSA:          1.771 m Patient Age:    75 years        BP:            132/91 mmHg Patient Gender: F               HR:           124 bpm. Exam Location:  Inpatient Procedure: Limited Echo, Limited Color Doppler and Cardiac Doppler Indications:     abnormal ecg  History:         Patient has prior history of Echocardiogram examinations, most                  recent 04/22/2021. Risk Factors:Hypertension.  Sonographer:     Johny Chess RDCS Referring Phys:  [id number] [place] Diagnosing Phys: Gwyndolyn Kaufman MD IMPRESSIONS  1. Left ventricular ejection fraction, by estimation, is 50 to 55%. The left ventricle has low normal function. The left ventricle has no regional wall motion abnormalities.  2. The right ventricular size is normal. There is normal pulmonary artery systolic pressure. The estimated right ventricular systolic pressure is 46.8 mmHg.  3. The mitral valve is grossly normal. Trivial mitral valve regurgitation.  4. The aortic valve was not well visualized. Aortic valve regurgitation is not visualized. No aortic stenosis is present.  5. The inferior vena cava is normal in size with greater than 50% respiratory variability, suggesting right atrial pressure of 3 mmHg.  6. Tachycardiac throughout study. Comparison(s): Compared to prior TTE in 04/2021, the EF appears slightly lower at ~55% (previously 60-65%) but patient is notably more tachycardic during current study. FINDINGS  Left Ventricle: Left ventricular ejection fraction, by estimation, is 50 to 55%. The left ventricle has low normal function. The left ventricle has no regional wall motion abnormalities. The left ventricular internal cavity size was normal in size. There is no left ventricular hypertrophy. Right Ventricle: The right ventricular size is normal. There is normal pulmonary artery systolic pressure. The tricuspid regurgitant velocity is 2.59 m/s, and with an assumed right atrial pressure of 3 mmHg, the estimated right ventricular systolic pressure is 03.2 mmHg. Left Atrium: Left atrial size  was normal in size. Right Atrium: Right atrial size was normal in size. Pericardium: There is no evidence of pericardial effusion. Mitral Valve: The mitral valve is grossly normal. There is mild thickening of the mitral valve leaflet(s). There is mild calcification of the mitral valve leaflet(s). Trivial mitral valve regurgitation. Tricuspid Valve: The tricuspid valve is normal in structure. Tricuspid valve regurgitation is mild. Aortic Valve: The aortic valve was not well visualized. Aortic valve regurgitation is not visualized. No aortic stenosis is present. Pulmonic Valve: The pulmonic valve was normal in structure. Pulmonic valve regurgitation is trivial. Venous: The  inferior vena cava is normal in size with greater than 50% respiratory variability, suggesting right atrial pressure of 3 mmHg. IAS/Shunts: The atrial septum is grossly normal. LEFT VENTRICLE PLAX 2D LVIDd:         5.10 cm LVIDs:         3.60 cm  LV Volumes (MOD) LV vol d, MOD A4C: 81.9 ml LV vol s, MOD A4C: 45.7 ml LV SV MOD A4C:     81.9 ml AORTIC VALVE LVOT Vmax:   93.30 cm/s LVOT Vmean:  58.000 cm/s LVOT VTI:    0.110 m TRICUSPID VALVE TR Peak grad:   26.8 mmHg TR Vmax:        259.00 cm/s  SHUNTS Systemic VTI: 0.11 m Gwyndolyn Kaufman MD Electronically signed by Gwyndolyn Kaufman MD Signature Date/Time: 08/15/2021/10:07:59 AM    Final (Updated)    [place] EKG SITE RITE  Result Date: 08/17/2021 If Site Rite image not attached, placement could not be confirmed due to current cardiac rhythm.  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 Images obtained: 7, over 16 hours FINDINGS:  Poor emptying of tracer from the stomach, potentially related to patient being supine for prolonged periods. Proximal small bowel loops normal appearance. Dilated small bowel loops are seen in the hernia sac adjacent to the colostomy. However contrast passes beyond these into distal small bowel. Several of the small bowel loops immediately proximal to the hernia sac are significantly dilated however the course of the exam. However contrast passes beyond the parastomal herniation and into the RIGHT colon, which is decompressed. Findings are consistent with a partial small bowel obstruction at the level of the parastomal hernia. IMPRESSION: Partial small bowel obstruction at the level of the parastomal hernia, with several dilated loops proximal to the hernia as well as within the hernia sac, though contrast is able to pass beyond into nondistended distal small bowel loops and on to the RIGHT colon. Electronically Signed   By: Lavonia Dana M.D.   On: 08/23/2021 09:11    Orson Eva, DO  [facility]  If 7PM-7AM, please contact night-coverage [email or web address] Password [email or web address] 08/24/2021, 7:58 AM   LOS: 13 days

## 2021-08-24 NOTE — Consult Note (Addendum)
Cardiology Consultation:   Patient ID: ZAMARIYA NEAL MRN: [id number]; DOB: 1971/01/24  Admit date: 08/09/2021 Date of Consult: 08/24/2021  PCP:  Caryl Bis, MD   [facility] Providers Cardiologist: New to [facility]  Patient Profile:   Alyssa Carey is a 51 y.o. female with a hx of HTN, necrotizing fasciitis with diverting colostomy and history of alcohol use who is being seen 08/24/2021 for the evaluation of tachycardia at the request of Dr. Carles Collet.  History of Present Illness:   Alyssa Carey was admitted to [facility] on 08/09/2021 for evaluation of worsening abdominal pain and was initially admitted for dehydration felt to be secondary to high ostomy output. Was found to have pancolitis and started on Ciprofloxacin and Flagyl. She did have sinus tachycardia on admission and D-dimer was elevated but CTA was negative for a PE. Limited echo showed a mildly reduced EF of 50-55% with no regional WMA and only trivial MR. Was initially on Lopressor 19m BID which was started on 1/30.   She developed a high-grade SBO during admission and required NG tube placement. Surgery has been following closely as there was concern she would require intervention for her SBO given her parastomal hernia. Given her NPO status, she has been receiving IV Lopressor 2.561mQ6H for the past several days.   Her SBO continued to improve and NG tube was removed on 08/23/2021. During the evening hours of 2/7, she was noted to be tachycardiac with HR in the 140's. Received IV Adenosine 44m32mollowed by 29m25mth minimal change in her HR. Received IV Lopressor with improvement in her HR. EKG showed computer-generated read of sinus tachycardia, HR 163 but concerning for atrial flutter with RVR. Rates have improved and are currently in the 120's. BP soft at 104/77 on most recent check.   In talking with this patient this morning, she reports feeling very flushed last night when her heart rate was elevated but denies any specific  chest pain or palpitations. Says that she felt anxious when several nurses rushed into her room to check on her but had previously been breathing normally. No recent orthopnea, PND or pitting edema.Unaware of any cardiac history of CAD, CHF or cardiac arrhythmias. Reports her mother has a history of an irregular heart rhythm but she is unsure of the specifics surrounding this. She is still experiencing some nausea and reports mild abdominal discomfort. Repeat abdominal imaging this morning shows residual partial small bowel obstruction.   Past Medical History:  Diagnosis Date   HTN (hypertension) 10/05/2020   [facility]  Past Surgical History:  Procedure Laterality Date   BIOPSY  10/07/2020   Procedure: BIOPSY;  Surgeon: RourDaneil Dolin;  [facility];  Service: Endoscopy;;   COLONOSCOPY WITH PROPOFOL N/A 10/07/2020   single healing rectal ulcer in distal rectum with surrounding mucosal friability and markedly abnormal proximal colon with ulceration s/p biopsies.   COLONOSCOPY WITH PROPOFOL  02/22/2021   Procedure: COLONOSCOPY WITH PROPOFOL;  Surgeon: CarvEloise Harman;  [facility];  Service: Endoscopy;;   ESOPHAGOGASTRODUODENOSCOPY (EGD) WITH PROPOFOL N/A 10/07/2020   non-bleeding gastric ulcer . Pathology with H.pylori negative   ESOPHAGOGASTRODUODENOSCOPY (EGD) WITH PROPOFOL N/A 12/27/2020   Procedure: ESOPHAGOGASTRODUODENOSCOPY (EGD) WITH PROPOFOL;  Surgeon: CarvEloise Harman;  [facility]: [facility];  Service: Endoscopy;  Laterality: N/A;  1:00pm   FLEXIBLE SIGMOIDOSCOPY N/A 01/11/2020   Procedure: FLEXIBLE SIGMOIDOSCOPY;  Surgeon: HungCarol Ada;  Location:  [facility];  Service: Gastroenterology;  Laterality: N/A;   INCISION AND DRAINAGE PERIRECTAL ABSCESS N/A 12/25/2019   Procedure: IRRIGATION AND DEBRIDEMENT BUTTOCKS, LAP LOOP COLOSTOMY;  Surgeon: Greer Pickerel, MD;  Location: [facility];  Service: General;  Laterality: N/A;    IRRIGATION AND DEBRIDEMENT ABSCESS N/A 12/23/2019   Procedure: EXCISION AND DEBRIDEMENT LEFT BUTTOCK AND PERINEUM;  Surgeon: Clovis Riley, MD;  Location: [facility];  Service: General;  Laterality: N/A;   LAPAROSCOPIC LOOP COLOSTOMY N/A 12/25/2019   Procedure: LAPAROSCOPIC LOOP COLOSTOMY;  Surgeon: Greer Pickerel, MD;  Location: [facility];  Service: General;  Laterality: N/A;     Home Medications:  Prior to Admission medications   Medication Sig Start Date End Date Taking? Authorizing Provider  acetaminophen (TYLENOL) 325 MG tablet Take 325 mg by mouth every 6 (six) hours as needed for mild pain.   Yes [provider]  Nutritional Supplements (BOOST PO) Take 1 Bottle by mouth 4 (four) times daily.   Yes [provider]  famotidine (PEPCID) 20 MG tablet Take 1 tablet (20 mg total) by mouth 2 (two) times daily. Patient not taking: Reported on 04/21/2021 03/29/21 04/28/21  Barb Merino, MD  folic acid (FOLVITE) 1 MG tablet Take 1 tablet (1 mg total) by mouth daily. Patient not taking: Reported on 08/10/2021 04/12/21   Aline August, MD  furosemide (LASIX) 40 MG tablet Take 1 tablet (40 mg total) by mouth daily. 04/23/21 05/23/21  Donne Hazel, MD  hydrOXYzine (ATARAX/VISTARIL) 10 MG tablet Take 1 tablet (10 mg total) by mouth 3 (three) times daily as needed for itching. Patient not taking: Reported on 08/10/2021 04/23/21   Donne Hazel, MD    Inpatient Medications: Scheduled Meds:  chlorhexidine  15 mL Mouth Rinse BID   Chlorhexidine Gluconate Cloth  6 each Topical Daily   feeding supplement  1 Container Oral TID BM   heparin  5,000 Units Subcutaneous Q8H   insulin aspart  0-15 Units Subcutaneous Q8H   mouth rinse  15 mL Mouth Rinse q12n4p   metoprolol tartrate  2.5 mg Intravenous Q6H   pantoprazole (PROTONIX) IV  40 mg Intravenous Q12H   sodium chloride flush  10-40 mL Intracatheter Q12H   Continuous Infusions:  magnesium sulfate bolus IVPB     methocarbamol (ROBAXIN) IV  Stopped (08/18/21 0730)   TPN ADULT (ION) 80 mL/hr at 08/24/21 0741   TPN ADULT (ION)     PRN Meds: acetaminophen **OR** acetaminophen, albuterol, diphenhydrAMINE, loperamide, LORazepam, methocarbamol (ROBAXIN) IV, morphine injection, ondansetron **OR** ondansetron (ZOFRAN) IV, phenol, sodium chloride flush  Allergies:    Allergies  Allergen Reactions   Pantoprazole     Stomach pain   Penicillins Hives    Did it involve swelling of the face/tongue/throat, SOB, or low BP? N Did it involve sudden or severe rash/hives, skin peeling, or any reaction on the inside of your mouth or nose? Y Did you need to seek medical attention at a hospital or doctor's office? N When did it last happen?  Childhood     If all above answers are "NO", may proceed with cephalosporin use.    Oxycodone Hcl Rash    Social History:   Social History   Socioeconomic History   Marital status: Single    Spouse name: Not on file   Number of children: 1   Years of education: Not on file   Highest education level: Not on file  Occupational History   Occupation: unknown  Tobacco Use  Smoking status: Never   Smokeless tobacco: Never  Vaping Use   Vaping Use: Never used  Substance and Sexual Activity   Alcohol use: Not Currently   Drug use: Not Currently   Sexual activity: Not Currently  Other Topics Concern   Not on file  Social History Narrative   Patient found in hotel room; unsure of living situation prior.   Social Determinants of Health   Financial Resource Strain: Not on file  Food Insecurity: Not on file  Transportation Needs: Not on file  Physical Activity: Not on file  Stress: Not on file  Social Connections: Not on file  Intimate Partner Violence: Not on file    Family History:    Family History  Problem Relation Age of Onset   Colon polyps Mother        does not believe adenomas   Arrhythmia Mother    Colon cancer Neg Hx      ROS:  Please see the history of present illness.    All other ROS reviewed and negative.     Physical Exam/Data:   Vitals:   08/24/21 0600 08/24/21 0615 08/24/21 0700 08/24/21 0800  BP: 116/81 113/81 101/74 104/77  Pulse: (!) 119 (!) 117 (!) 124 (!) 120  Resp: (!) 34 (!) 33 (!) 45 (!) 27  Temp:    98.4 F (36.9 C)  TempSrc:    Oral  SpO2: 99% 99% 100% 96%  Weight:      Height:        Intake/Output Summary (Last 24 hours) at 08/24/2021 1042 Last data filed at 08/24/2021 0900 Gross per 24 hour  Intake 2272.98 ml  Output 4300 ml  Net -2027.02 ml   Last 3 Weights 08/24/2021 08/19/2021 08/18/2021  Weight (lbs) 166 lb 3.6 oz 184 lb 1.4 oz 186 lb 11.7 oz  Weight (kg) 75.4 kg 83.5 kg 84.7 kg     Body mass index is 34.74 kg/m.  General:  Well nourished, well developed female appearing in no acute distress HEENT: normal Neck: no JVD Vascular: No carotid bruits; Distal pulses 2+ bilaterally Cardiac:  normal S1, S2; Regular rhythm, tachycardiac rate.  Lungs:  clear to auscultation bilaterally, no wheezing, rhonchi or rales  Abd: tender to palpation.  Ext: no pitting edema Musculoskeletal:  No deformities, BUE and BLE strength normal and equal Skin: warm and dry  Neuro:  CNs 2-12 intact, no focal abnormalities noted Psych:  Normal affect   EKG:  The EKG was personally reviewed and demonstrates: Computer-generated read of sinus tachycardia, HR 163 but concerning for atrial flutter with RVR. Rates have improved and are currently in the 120's. BP soft at 104/77 on most recent check.   Telemetry:  Telemetry was personally reviewed and demonstrates: Atrial flutter, HR in 130's overnight. Appears now in sinus tachycardia by telemetry with HR in 110's.   Relevant CV Studies:  Limited Echo: 08/14/2021 IMPRESSIONS     1. Left ventricular ejection fraction, by estimation, is 50 to 55%. The  left ventricle has low normal function. The left ventricle has no regional  wall motion abnormalities.   2. The right ventricular size is normal. There  is normal pulmonary artery  systolic pressure. The estimated right ventricular systolic pressure is  41.6 mmHg.   3. The mitral valve is grossly normal. Trivial mitral valve  regurgitation.   4. The aortic valve was not well visualized. Aortic valve regurgitation  is not visualized. No aortic stenosis is present.   5. The  inferior vena cava is normal in size with greater than 50%  respiratory variability, suggesting right atrial pressure of 3 mmHg.   6. Tachycardiac throughout study.   Comparison(s): Compared to prior TTE in 04/2021, the EF appears slightly  lower at ~55% (previously 60-65%) but patient is notably more tachycardic  during current study.   Laboratory Data:  High Sensitivity Troponin:  No results for input(s): TROPONINIHS in the last 720 hours.   Chemistry Recent Labs  Lab 08/22/21 0700 08/23/21 0817 08/24/21 0826  NA 135 132* 135  K 3.9 4.2 4.0  CL 99 95* 104  CO2 30 30 26   GLUCOSE 120* 134* 110*  BUN 7 7 7   CREATININE 0.33* 0.30* <0.30*  CALCIUM 8.1* 8.2* 8.1*  MG 1.5* 1.6* 1.8  GFRNONAA >60 >60 NOT CALCULATED  ANIONGAP 6 7 5     Recent Labs  Lab 08/19/21 0405 08/22/21 0700 08/24/21 0826  PROT 5.5* 5.8* 6.1*  ALBUMIN 1.9* 1.8* 2.0*  AST 17 17 35  ALT 10 9 15   ALKPHOS 82 64 176*  BILITOT 0.8 0.1* 0.5   Lipids  Recent Labs  Lab 08/22/21 0501  TRIG 462*    Hematology Recent Labs  Lab 08/18/21 0221 08/20/21 0457 08/24/21 0404  WBC 20.2* 9.9 19.3*  RBC 3.06* 2.69* 2.77*  HGB 9.6* 8.3* 8.7*  HCT 28.8* 26.9* 26.0*  MCV 94.1 100.0 93.9  MCH 31.4 30.9 31.4  MCHC 33.3 30.9 33.5  RDW 16.6* 17.7* 17.2*  PLT 607* 515* 376   Thyroid No results for input(s): TSH, FREET4 in the last 168 hours.  BNPNo results for input(s): BNP, PROBNP in the last 168 hours.  DDimer No results for input(s): DDIMER in the last 168 hours.   Radiology/Studies:  DG Chest 2 View  Result Date: 08/24/2021 CLINICAL DATA:  Fever. EXAM: CHEST - 2 VIEW COMPARISON:   August 17, 2021. FINDINGS: The heart size and mediastinal contours are within normal limits. Right-sided PICC line is unchanged in position. Mild bibasilar subsegmental atelectasis is noted. The visualized skeletal structures are unremarkable. IMPRESSION: Mild bibasilar subsegmental atelectasis. Electronically Signed   By: Marijo Conception M.D.   On: 08/24/2021 10:05   DG Abd 1 View  Result Date: 08/24/2021 CLINICAL DATA:  Dehydration, vomiting, small-bowel obstruction, necrotizing fasciitis. Small bowel fluoroscopy exam yesterday. 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 EXAM: ABDOMEN - 1 VIEW COMPARISON:  Small-bowel follow-through 08/22/2021 FINDINGS: Residual contrast material is demonstrated in dilated small bowel with decompressed terminal ileum and contrast seen in the cecum. This is consistent with partial small bowel obstruction. Left lower quadrant ostomy with peristomal hernia suggested. No significant change since prior study. IMPRESSION: Residual contrast material in the small bowel and colon. Changes are consistent with partial small bowel obstruction. Electronically Signed   By: Lucienne Capers M.D.   On: 08/24/2021 01:47   DG Abd 1 View  Result Date: 08/22/2021 CLINICAL DATA:  Small-bowel obstruction, history of colostomy EXAM: ABDOMEN - 1 VIEW COMPARISON:  Portable exam 0710 hours compared to 08/19/2021 FINDINGS: Persistent small bowel dilatation little changed. Paucity of colonic gas. No bowel wall thickening, or urinary tract calcification, or acute osseous findings. IMPRESSION: Persistent small bowel dilatation consistent with obstruction. Electronically Signed   By: Lavonia Dana M.D.   On: 08/22/2021 08:50   DG SMALL BOWEL W DOUBLE CM (HD)  Result Date: 08/23/2021 CLINICAL DATA:  Small-bowel obstruction, has loop colostomy in LEFT lower quadrant with parastomal herniation of small bowel EXAM: SMALL  BOWEL SERIES COMPARISON:  Abdominal radiograph 08/22/2021, CT abdomen and  pelvis 02/13/2022 TECHNIQUE: Following ingestion of thin barium, serial small bowel images were obtained including spot views of the terminal ileum. Images obtained: 7, over 16 hours FINDINGS: Poor emptying of tracer from the stomach, potentially related to patient being supine for prolonged periods. Proximal small bowel loops normal appearance. Dilated small bowel loops are seen in the hernia sac adjacent to the colostomy. However contrast passes beyond these into distal small bowel. Several of the small bowel loops immediately proximal to the hernia sac are significantly dilated however the course of the exam. However contrast passes beyond the parastomal herniation and into the RIGHT colon, which is decompressed. Findings are consistent with a partial small bowel obstruction at the level of the parastomal hernia. IMPRESSION: Partial small bowel obstruction at the level of the parastomal hernia, with several dilated loops proximal to the hernia as well as within the hernia sac, though contrast is able to pass beyond into nondistended distal small bowel loops and on to the RIGHT colon. Electronically Signed   By: Lavonia Dana M.D.   On: 08/23/2021 09:11     Assessment and Plan:   1. Narrow-Complex Tachycardia - She was in sinus tachycardia earlier this admission and her rhythm last night appears most consistent with atrial flutter with RVR but could be SVT however she did not convert with Adenosine. Amiodarone was ordered but never administered. By review of telemetry, she has significant artifact but has possibly converted back to sinus tachycardia. - Currently NPO and agree with continuing IV Lopressor 2.5 mg Q6H and would not titrate given SBP in the low-100's at times. Mg was at 1.8 this AM and will replace to keep ~ 2.0. K+ at 4.0. - Her CHA2DS2-VASc Score and unadjusted Ischemic Stroke Rate (% per year) is equal to 2.2 % stroke rate/year from a score of 2. At this time, she is not a good  anticoagulation candidate given her anemia (Hgb at 8.7 this AM), partial SBO, history of PUD and positive FOBT.   2. Pancolitis/SBO - She has been treated with Cipro and Flagyl. NG tube removed on 2/7 and repeat imaging shows a partial SBO. Management per Surgery and the admitting team.   3. Anemia - Hgb at 8.7 today. FOBT was positive this admission and outpatient GI evaluation recommended.    Risk Assessment/Risk Scores:    CHA2DS2-VASc Score = 2   This indicates a 2.2% annual risk of stroke. The patient's score is based upon: CHF History: 0 HTN History: 1 Diabetes History: 0 Stroke History: 0 Vascular Disease History: 0 Age Score: 0 Gender Score: 1      For questions or updates, please contact [place] Please consult [email or web address] for contact info under    Signed, Erma Heritage, PA-C  08/24/2021 10:42 AM  Patient examined chart reviewed Intermittent flutter Exam with chronically ill black female with colostomy LLQ clear lungs and no murmur Trace LE edema She is getting TPN.  Agree she is not a good long term anticoagulation candidate continue beta blocker consider flecainide  or multaq for any further recurrences  Jenkins Rouge MD [facility]

## 2021-08-24 NOTE — Progress Notes (Signed)
[facility] Progress Note     Subjective: Had elevated HR last night and she reports she was just in the bed and they scared her when they came in to check her. She denies any nausea or vomiting. She is tolerating the clears. She wants more and wants some chocolate ensure.   Objective: Vital signs in last 24 hours: Temp:  [98.1 F (36.7 C)-100.7 F (38.2 C)] 98.4 F (36.9 C) (02/08 0800) Pulse Rate:  [99-160] 120 (02/08 0800) Resp:  [11-45] 27 (02/08 0800) BP: (81-116)/(48-84) 104/77 (02/08 0800) SpO2:  [92 %-100 %] 96 % (02/08 0800) Weight:  [75.4 kg] 75.4 kg (02/08 0500) Last BM Date: 08/23/21  Intake/Output from previous day: 02/07 0701 - 02/08 0700 In: 1604.1 [P.O.:840; I.V.:764.1] Out: 3350 [Urine:1900; GBEEF:0071] Intake/Output this shift: Total I/O In: 908.9 [I.V.:908.9] Out: 950 [Urine:500; Stool:450]  General appearance: alert and no distress Resp: normal work of breathing GI: soft, nondistended, parastomal hernia reducing easily, nontender, ostomy with output and pink  Lab Results:  Recent Labs    08/24/21 0404  WBC 19.3*  HGB 8.7*  HCT 26.0*  PLT 376   BMET Recent Labs    08/23/21 0817 08/24/21 0826  NA 132* 135  K 4.2 4.0  CL 95* 104  CO2 30 26  GLUCOSE 134* 110*  BUN 7 7  CREATININE 0.30* <0.30*  CALCIUM 8.2* 8.1*   PT/INR No results for input(s): LABPROT, INR in the last 72 hours.  Studies/Results: DG Chest 2 View  Result Date: 08/24/2021 CLINICAL DATA:  Fever. EXAM: CHEST - 2 VIEW COMPARISON:  August 17, 2021. FINDINGS: The heart size and mediastinal contours are within normal limits. Right-sided PICC line is unchanged in position. Mild bibasilar subsegmental atelectasis is noted. The visualized skeletal structures are unremarkable. IMPRESSION: Mild bibasilar subsegmental atelectasis. Electronically Signed   By: Marijo Conception M.D.   On: 08/24/2021 10:05   DG Abd 1 View  Result Date: 08/24/2021 CLINICAL DATA:   Dehydration, vomiting, small-bowel obstruction, necrotizing fasciitis. Small bowel fluoroscopy exam yesterday. 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 EXAM: ABDOMEN - 1 VIEW COMPARISON:  Small-bowel follow-through 08/22/2021 FINDINGS: Residual contrast material is demonstrated in dilated small bowel with decompressed terminal ileum and contrast seen in the cecum. This is consistent with partial small bowel obstruction. Left lower quadrant ostomy with peristomal hernia suggested. No significant change since prior study. IMPRESSION: Residual contrast material in the small bowel and colon. Changes are consistent with partial small bowel obstruction. Electronically Signed   By: Lucienne Capers M.D.   On: 08/24/2021 01:47   DG SMALL BOWEL W DOUBLE CM (HD)  Result Date: 08/23/2021 CLINICAL DATA:  Small-bowel obstruction, has loop colostomy in LEFT lower quadrant with parastomal herniation of small bowel EXAM: SMALL BOWEL SERIES COMPARISON:  Abdominal radiograph 08/22/2021, CT abdomen and pelvis 02/13/2022 TECHNIQUE: Following ingestion of thin barium, serial small bowel images were obtained including spot views of the terminal ileum. Images obtained: 7, over 16 hours FINDINGS: Poor emptying of tracer from the stomach, potentially related to patient being supine for prolonged periods. Proximal small bowel loops normal appearance. Dilated small bowel loops are seen in the hernia sac adjacent to the colostomy. However contrast passes beyond these into distal small bowel. Several of the small bowel loops immediately proximal to the hernia sac are significantly dilated however the course of the exam. However contrast passes beyond the parastomal herniation and into the RIGHT colon, which is decompressed. Findings are consistent with  a partial small bowel obstruction at the level of the parastomal hernia. IMPRESSION: Partial small bowel obstruction at the level of the parastomal hernia, with several dilated loops  proximal to the hernia as well as within the hernia sac, though contrast is able to pass beyond into nondistended distal small bowel loops and on to the RIGHT colon. Electronically Signed   By: Lavonia Dana M.D.   On: 08/23/2021 09:11    Anti-infectives: Anti-infectives (From admission, onward)    Start     Dose/Rate Route Frequency Ordered Stop   08/19/21 1800  metroNIDAZOLE (FLAGYL) IVPB 500 mg        500 mg 100 mL/hr over 60 Minutes Intravenous Every 8 hours 08/19/21 1117 08/19/21 1900   08/19/21 1700  ciprofloxacin (CIPRO) IVPB 400 mg        400 mg 200 mL/hr over 60 Minutes Intravenous Every 12 hours 08/19/21 1117 08/19/21 1755   08/10/21 1000  ciprofloxacin (CIPRO) IVPB 400 mg  Status:  Discontinued        400 mg 200 mL/hr over 60 Minutes Intravenous Every 12 hours 08/10/21 0824 08/19/21 1117   08/10/21 0915  metroNIDAZOLE (FLAGYL) IVPB 500 mg  Status:  Discontinued        500 mg 100 mL/hr over 60 Minutes Intravenous Every 8 hours 08/10/21 0824 08/19/21 1117       Assessment/Plan: Patient with improving pSBO from her parastomal hernia. Doing better.  KUB reviewed and the contrast moving through Full liquid diet, ensures between meals Will monitor OOB to chair and up moving around, all will be better and get things moving for the patient Prealbumin added to labs Updated team and patient    LOS: 13 days    Virl Cagey 08/24/2021

## 2021-08-24 NOTE — Progress Notes (Signed)
Patient moved back to icu after sustaining HR 150s. She was given multiple boluses and two doses of 38m iv metoprolol without relief. Ekg showed sinus Tach. We tried adenosine, and more boluses. Her HR came down to 114 after amiodarone had been ordered - but it was never hung.

## 2021-08-25 LAB — COMPREHENSIVE METABOLIC PANEL
ALT: 15 U/L (ref 0–44)
AST: 24 U/L (ref 15–41)
Albumin: 2.2 g/dL — ABNORMAL LOW (ref 3.5–5.0)
Alkaline Phosphatase: 129 U/L — ABNORMAL HIGH (ref 38–126)
Anion gap: 4 — ABNORMAL LOW (ref 5–15)
BUN: 8 mg/dL (ref 6–20)
CO2: 27 mmol/L (ref 22–32)
Calcium: 8.4 mg/dL — ABNORMAL LOW (ref 8.9–10.3)
Chloride: 101 mmol/L (ref 98–111)
Creatinine, Ser: 0.33 mg/dL — ABNORMAL LOW (ref 0.44–1.00)
GFR, Estimated: >60 mL/min (ref >60)
Glucose, Bld: 124 mg/dL — ABNORMAL HIGH (ref 70–99)
Potassium: 4.8 mmol/L (ref 3.5–5.1)
Sodium: 132 mmol/L — ABNORMAL LOW (ref 135–145)
Total Bilirubin: 0.5 mg/dL (ref 0.3–1.2)
Total Protein: 6.5 g/dL (ref 6.5–8.1)

## 2021-08-25 LAB — CBC
HCT: 26.2 % — ABNORMAL LOW (ref 36.0–46.0)
Hemoglobin: 8.6 g/dL — ABNORMAL LOW (ref 12.0–15.0)
MCH: 31.6 pg (ref 26.0–34.0)
MCHC: 32.8 g/dL (ref 30.0–36.0)
MCV: 96.3 fL (ref 80.0–100.0)
Platelets: 300 10*3/uL (ref 150–400)
RBC: 2.72 MIL/uL — ABNORMAL LOW (ref 3.87–5.11)
RDW: 17.5 % — ABNORMAL HIGH (ref 11.5–15.5)
WBC: 10.9 10*3/uL — ABNORMAL HIGH (ref 4.0–10.5)
nRBC: 0 % (ref 0.0–0.2)

## 2021-08-25 LAB — GLUCOSE, CAPILLARY
Glucose-Capillary: 119 mg/dL — ABNORMAL HIGH (ref 70–99)
Glucose-Capillary: 110 mg/dL — ABNORMAL HIGH (ref 70–99)
Glucose-Capillary: 113 mg/dL — ABNORMAL HIGH (ref 70–99)

## 2021-08-25 LAB — MAGNESIUM: Magnesium: 1.9 mg/dL (ref 1.7–2.4)

## 2021-08-25 LAB — PHOSPHORUS: Phosphorus: 3.9 mg/dL (ref 2.5–4.6)

## 2021-08-25 MED ORDER — MELATONIN 3 MG PO TABS
6.0000 mg | ORAL_TABLET | Freq: Once | ORAL | Status: AC
  Administered 2021-08-25: 6 mg via ORAL
  Filled 2021-08-25: qty 2

## 2021-08-25 MED ORDER — TRAVASOL 10 % IV SOLN
INTRAVENOUS | Status: AC
  Filled 2021-08-25: qty 921.6

## 2021-08-25 MED ORDER — PSYLLIUM 95 % PO PACK
1.0000 | PACK | Freq: Every day | ORAL | Status: DC
  Administered 2021-08-25 – 2021-08-28 (×4): 1 via ORAL
  Filled 2021-08-25 (×8): qty 1

## 2021-08-25 NOTE — Progress Notes (Signed)
Subjective:  Denies SSCP, palpitations or Dyspnea Back in NSR   Objective:  Vitals:   08/25/21 0300 08/25/21 0400 08/25/21 0530 08/25/21 0600  BP: 113/78 116/79  117/87  Pulse: 91 100  (!) 101  Resp: (!) 27 (!) 29  (!) 26  Temp:   (!) 97.5 F (36.4 C)   TempSrc:   Axillary   SpO2: 97% 98%  98%  Weight:   75.8 kg   Height:        Intake/Output from previous day:  Intake/Output Summary (Last 24 hours) at 08/25/2021 0820 Last data filed at 08/25/2021 0126 Gross per 24 hour  Intake 380.01 ml  Output 2500 ml  Net -2119.99 ml    Physical Exam: Chronically ill black female Lungs clear No murmur  Post colectomy TPN Plus one edema   Lab Results: Basic Metabolic Panel: Recent Labs    08/24/21 0826 08/25/21 0609  NA 135 132*  K 4.0 4.8  CL 104 101  CO2 26 27  GLUCOSE 110* 124*  BUN 7 8  CREATININE <0.30* 0.33*  CALCIUM 8.1* 8.4*  MG 1.8 1.9  PHOS  --  3.9   Liver Function Tests: Recent Labs    08/24/21 0826 08/25/21 0609  AST 35 24  ALT 15 15  ALKPHOS 176* 129*  BILITOT 0.5 0.5  PROT 6.1* 6.5  ALBUMIN 2.0* 2.2*   No results for input(s): LIPASE, AMYLASE in the last 72 hours. CBC: Recent Labs    08/24/21 0404 08/25/21 0609  WBC 19.3* 10.9*  HGB 8.7* 8.6*  HCT 26.0* 26.2*  MCV 93.9 96.3  PLT 376 300     Imaging: DG Chest 2 View  Result Date: 08/24/2021 CLINICAL DATA:  Fever. EXAM: CHEST - 2 VIEW COMPARISON:  August 17, 2021. FINDINGS: The heart size and mediastinal contours are within normal limits. Right-sided PICC line is unchanged in position. Mild bibasilar subsegmental atelectasis is noted. The visualized skeletal structures are unremarkable. IMPRESSION: Mild bibasilar subsegmental atelectasis. Electronically Signed   By: Marijo Conception M.D.   On: 08/24/2021 10:05   DG Abd 1 View  Result Date: 08/24/2021 CLINICAL DATA:  Dehydration, vomiting, small-bowel obstruction, necrotizing fasciitis. Small bowel fluoroscopy exam yesterday.  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 EXAM: ABDOMEN - 1 VIEW COMPARISON:  Small-bowel follow-through 08/22/2021 FINDINGS: Residual contrast material is demonstrated in dilated small bowel with decompressed terminal ileum and contrast seen in the cecum. This is consistent with partial small bowel obstruction. Left lower quadrant ostomy with peristomal hernia suggested. No significant change since prior study. IMPRESSION: Residual contrast material in the small bowel and colon. Changes are consistent with partial small bowel obstruction. Electronically Signed   By: Lucienne Capers M.D.   On: 08/24/2021 01:47   DG SMALL BOWEL W DOUBLE CM (HD)  Result Date: 08/23/2021 CLINICAL DATA:  Small-bowel obstruction, has loop colostomy in LEFT lower quadrant with parastomal herniation of small bowel EXAM: SMALL BOWEL SERIES COMPARISON:  Abdominal radiograph 08/22/2021, CT abdomen and pelvis 02/13/2022 TECHNIQUE: Following ingestion of thin barium, serial small bowel images were obtained including spot views of the terminal ileum. Images obtained: 7, over 16 hours FINDINGS: Poor emptying of tracer from the stomach, potentially related to patient being supine for prolonged periods. Proximal small bowel loops normal appearance. Dilated small bowel loops are seen in the hernia sac adjacent to the colostomy. However contrast passes beyond these into distal small bowel. Several of the small bowel loops immediately proximal to  the hernia sac are significantly dilated however the course of the exam. However contrast passes beyond the parastomal herniation and into the RIGHT colon, which is decompressed. Findings are consistent with a partial small bowel obstruction at the level of the parastomal hernia. IMPRESSION: Partial small bowel obstruction at the level of the parastomal hernia, with several dilated loops proximal to the hernia as well as within the hernia sac, though contrast is able to pass beyond into  nondistended distal small bowel loops and on to the RIGHT colon. Electronically Signed   By: Lavonia Dana M.D.   On: 08/23/2021 09:11    Cardiac Studies:  ECG:    Telemetry:  NSR 08/25/2021   Echo: EF 50-55% trivial MR   Medications:    chlorhexidine  15 mL Mouth Rinse BID   Chlorhexidine Gluconate Cloth  6 each Topical Daily   feeding supplement  1 Container Oral TID BM   heparin  5,000 Units Subcutaneous Q8H   insulin aspart  0-15 Units Subcutaneous Q8H   mouth rinse  15 mL Mouth Rinse q12n4p   metoprolol tartrate  2.5 mg Intravenous Q6H   pantoprazole (PROTONIX) IV  40 mg Intravenous Q12H   sodium chloride flush  10-40 mL Intracatheter Q12H      methocarbamol (ROBAXIN) IV Stopped (08/18/21 0730)   TPN ADULT (ION) 80 mL/hr at 08/24/21 1836    Assessment/Plan:  Alyssa Carey is a 51 y.o. female with a hx of HTN, necrotizing fasciitis with diverting colostomy and history of alcohol use who is being seen 08/24/2021 for the evaluation of tachycardia noted to have atrial flutter   Arrhythmia:  Not a candidate for anticoagulation at this time CHADVASC 2 Hct 26.2 FOBT positive NPO continue PRN iv lopressor  Pancolitis:  partial SBO NG tube out on Cipro and Flagyl TPN per surgery   Alyssa Carey 08/25/2021, 8:20 AM

## 2021-08-25 NOTE — Progress Notes (Signed)
[facility] Progress Note     Subjective: Some nausea overnight but no vomiting. Ostomy output continues.   Objective: Vital signs in last 24 hours: Temp:  [97.5 F (36.4 C)-98.5 F (36.9 C)] 97.5 F (36.4 C) (02/09 0530) Pulse Rate:  [81-118] 94 (02/09 1000) Resp:  [21-30] 25 (02/09 1000) BP: (93-130)/(50-88) 117/87 (02/09 0600) SpO2:  [95 %-100 %] 99 % (02/09 1000) Weight:  [75.8 kg] 75.8 kg (02/09 0530) Last BM Date: 08/24/21  Intake/Output from previous day: 02/08 0701 - 02/09 0700 In: 1288.9 [I.V.:1279.6; IV Piggyback:9.3] Out: 2900 [Urine:1300; Stool:1600] Intake/Output this shift: Total I/O In: 1097.8 [I.V.:1097.8] Out: 400 [Urine:375; Stool:25]  General appearance: alert and no distress Resp: normal work of breathing GI: soft, nondistended, parastomal hernia easily reducible, ostomy pink with more formed stool in bag  Lab Results:  Recent Labs    08/24/21 0404 08/25/21 0609  WBC 19.3* 10.9*  HGB 8.7* 8.6*  HCT 26.0* 26.2*  PLT 376 300   BMET Recent Labs    08/24/21 0826 08/25/21 0609  NA 135 132*  K 4.0 4.8  CL 104 101  CO2 26 27  GLUCOSE 110* 124*  BUN 7 8  CREATININE <0.30* 0.33*  CALCIUM 8.1* 8.4*   PT/INR No results for input(s): LABPROT, INR in the last 72 hours.  Studies/Results: DG Chest 2 View  Result Date: 08/24/2021 CLINICAL DATA:  Fever. EXAM: CHEST - 2 VIEW COMPARISON:  August 17, 2021. FINDINGS: The heart size and mediastinal contours are within normal limits. Right-sided PICC line is unchanged in position. Mild bibasilar subsegmental atelectasis is noted. The visualized skeletal structures are unremarkable. IMPRESSION: Mild bibasilar subsegmental atelectasis. Electronically Signed   By: Marijo Conception M.D.   On: 08/24/2021 10:05   DG Abd 1 View  Result Date: 08/24/2021 CLINICAL DATA:  Dehydration, vomiting, small-bowel obstruction, necrotizing fasciitis. Small bowel fluoroscopy exam yesterday. Parastomal hernia  on the left with leaking ostomy. EXAM: ABDOMEN - 1 VIEW COMPARISON:  Small-bowel follow-through 08/22/2021 FINDINGS: Residual contrast material is demonstrated in dilated small bowel with decompressed terminal ileum and contrast seen in the cecum. This is consistent with partial small bowel obstruction. Left lower quadrant ostomy with peristomal hernia suggested. No significant change since prior study. IMPRESSION: Residual contrast material in the small bowel and colon. Changes are consistent with partial small bowel obstruction. Electronically Signed   By: Lucienne Capers M.D.   On: 08/24/2021 01:47    Anti-infectives: Anti-infectives (From admission, onward)    Start     Dose/Rate Route Frequency Ordered Stop   08/19/21 1800  metroNIDAZOLE (FLAGYL) IVPB 500 mg        500 mg 100 mL/hr over 60 Minutes Intravenous Every 8 hours 08/19/21 1117 08/19/21 1900   08/19/21 1700  ciprofloxacin (CIPRO) IVPB 400 mg        400 mg 200 mL/hr over 60 Minutes Intravenous Every 12 hours 08/19/21 1117 08/19/21 1755   08/10/21 1000  ciprofloxacin (CIPRO) IVPB 400 mg  Status:  Discontinued        400 mg 200 mL/hr over 60 Minutes Intravenous Every 12 hours 08/10/21 0824 08/19/21 1117   08/10/21 0915  metroNIDAZOLE (FLAGYL) IVPB 500 mg  Status:  Discontinued        500 mg 100 mL/hr over 60 Minutes Intravenous Every 8 hours 08/10/21 0824 08/19/21 1117       Assessment/Plan: Patient with SBO related to her parastomal hernia. Doing better but some nausea.  Diet as tolerated Going  to order some metamucil to see if that will help with her output She likes the ensure/ chocolate ice cream Will get AAS to assess tomorrow.   LOS: 14 days    Virl Cagey 08/25/2021

## 2021-08-25 NOTE — Evaluation (Signed)
Physical Therapy Evaluation Patient Details Name: Alyssa Carey MRN: [id number] DOB: 1971/05/14 Today's Date: 08/25/2021  History of Present Illness  Alyssa Carey  is a 51 y.o. 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  Patient was admitted with pancolitis and associated dehydration as well as electrolyte abnormalities with hypokalemia and hypomagnesemia in the setting of ongoing high output/diarrhea.  She has been started on ciprofloxacin and Flagyl empirically on 1/25 and GI pathogen panel as well as C. difficile testing has been ordered which have now returned negative.  General surgery had initially evaluated patient on account of the parastomal hernia, but patient was improving and therefore they had signed off.  She now is having some persistent nausea and vomiting along with abdominal pain and repeat CT imaging on 1/31 demonstrates high-grade bowel obstruction findings for which general surgery has been reconsulted and has recommended patient to be n.p.o. and to place NG tube.   Clinical Impression  Patient functioning near baseline for functional mobility and gait demonstrating good return for ambulation in room and hallways without loss of balance, without need of AD.  Patient encouraged to ambulate with nursing staff daily as tolerated for length of stay.  Plan:  Patient discharged from physical therapy to care of nursing for ambulation daily as tolerated for length of stay.         Recommendations for follow up therapy are one component of a multi-disciplinary discharge planning process, led by the attending physician.  Recommendations may be updated based on patient status, additional functional criteria and insurance authorization.  Follow Up Recommendations No PT follow up    Assistance Recommended at Discharge PRN  Patient can return home with the following        Equipment Recommendations None recommended by PT  Recommendations for Other Services       Functional Status Assessment Patient has not had a recent decline in their functional status     Precautions / Restrictions Precautions Precautions: None Restrictions Weight Bearing Restrictions: No      Mobility  Bed Mobility Overal bed mobility: Modified Independent                  Transfers Overall transfer level: Modified independent                      Ambulation/Gait Ambulation/Gait assistance: Modified independent (Device/Increase time) Gait Distance (Feet): 100 Feet   Gait Pattern/deviations: WFL(Within Functional Limits) Gait velocity: decreased     General Gait Details: grossly WFL demonstrating good return for ambulation in room and hallways without loss of balance, without need of AD  Stairs            Wheelchair Mobility    Modified Rankin (Stroke Patients Only)       Balance Overall balance assessment: No apparent balance deficits (not formally assessed)                                           Pertinent Vitals/Pain Pain Assessment Pain Assessment: No/denies pain    Home Living Family/patient expects to be discharged to:: Private residence Living Arrangements: Children;Other relatives Available Help at Discharge: Family;Available 24 hours/day Type of Home: House Home Access: Stairs to enter Entrance Stairs-Rails: Left Entrance Stairs-Number of Steps: 2  Home Layout: One level Home Equipment: Cane - single point      Prior Function Prior Level of Function : Independent/Modified Independent             Mobility Comments: Hydrographic surveyor without AD ADLs Comments: Independent     Hand Dominance   Dominant Hand: Right    Extremity/Trunk Assessment   Upper Extremity Assessment Upper Extremity Assessment: Overall WFL for tasks assessed    Lower Extremity Assessment Lower Extremity  Assessment: Overall WFL for tasks assessed    Cervical / Trunk Assessment Cervical / Trunk Assessment: Normal  Communication   Communication: No difficulties  Cognition Arousal/Alertness: Awake/alert Behavior During Therapy: WFL for tasks assessed/performed Overall Cognitive Status: Within Functional Limits for tasks assessed                                          General Comments      Exercises     Assessment/Plan    PT Assessment Patient does not need any further PT services  PT Problem List         PT Treatment Interventions      PT Goals (Current goals can be found in the Care Plan section)  Acute Rehab PT Goals Patient Stated Goal: return home PT Goal Formulation: With patient Time For Goal Achievement: 08/25/21 Potential to Achieve Goals: Good    Frequency       Co-evaluation               AM-PAC PT "6 Clicks" Mobility  Outcome Measure Help needed turning from your back to your side while in a flat bed without using bedrails?: None Help needed moving from lying on your back to sitting on the side of a flat bed without using bedrails?: None Help needed moving to and from a bed to a chair (including a wheelchair)?: None Help needed standing up from a chair using your arms (e.g., wheelchair or bedside chair)?: None Help needed to walk in hospital room?: None Help needed climbing 3-5 steps with a railing? : None 6 Click Score: 24    End of Session   Activity Tolerance: Patient tolerated treatment well Patient left: in bed;with call bell/phone within reach Nurse Communication: Mobility status PT Visit Diagnosis: Unsteadiness on feet (R26.81);Difficulty in walking, not elsewhere classified (R26.2)    Time: 2993-7169 PT Time Calculation (min) (ACUTE ONLY): 20 min   Charges:   PT Evaluation $PT Eval Moderate Complexity: 1 Mod PT Treatments $Therapeutic Activity: 8-22 mins        3:21 PM, 08/25/21 Lonell Grandchild,  MPT Physical Therapist with [facility] 336 [phone] office [phone] mobile phone

## 2021-08-25 NOTE — Progress Notes (Signed)
PROGRESS NOTE  Alyssa Carey RFF:[id number] DOB: 05-05-1971 DOA: 08/09/2021 PCP: Caryl Bis, MD    Brief History:  Alyssa Carey  is a 51 y.o. 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  Patient was admitted with pancolitis and associated dehydration as well as electrolyte abnormalities with hypokalemia and hypomagnesemia in the setting of ongoing high output/diarrhea.  She has been started on ciprofloxacin and Flagyl empirically on 1/25 and GI pathogen panel as well as C. difficile testing has been ordered which have now returned negative.  General surgery had initially evaluated patient on account of the parastomal hernia, but patient was improving and therefore they had signed off.  She now is having some persistent nausea and vomiting along with abdominal pain and repeat CT imaging on 1/31 demonstrates high-grade bowel obstruction findings for which general surgery has been reconsulted and has recommended patient to be n.p.o. and to place NG tube.  Pt began to improve clinically after NG placement with decreased ostomy output and improved abd pain.  NG was subsequently removed 08/23/21 and pt started on clears Pt developed SVT evening of 08/23/21 and was moved to stepdown unit after adenosine 6 mg x2 and given 2 additional doses of IV lopressor.  Cardiology was consulted and felt patient had aflutter vs SVTand agreed with present management.  Echo (see below) and TSH were reassuring.   Assessment/Plan: Pancolitis -resolving -Last day of ciprofloxacin and Flagyl (patient completed 10 days of antibiotic therapy). -GI pathogen panel negative and C. difficile testing negative -No recent use of antibiotics prior to development   New findings of high-grade small bowel obstruction in the setting of parastomal hernia -08/15/21 CT abd--high grade SBO -2/7/23SBFT  demonstrating contrast has moved through most of the small bowel and has been able to pass through hernia process.  -NG tube has been discontinued; clear liquid diet  started 2/7 -Patient advised to increase physical activity and to sit most of the time in the chair to further facilitate bowel peristalsis.  -Based on recent images findings patient now with contrast passing into distal SB and right colon -Continuing TPN for now -Continue to follow general surgery recommendations. -increase activity>>PT eval   Dehydration secondary to above -high ostomy output at time of admission, now decreasing oral intake and NG tube usage until 08/23/2021 -NG tube has been removed; diet will be advanced as tolerated starting with clear liquids>>full liquids -Continue nutritional supplements -TPN per surgery   Narrow complex tachycardia/Paroxsymal SVT -HR had been in 110s due to acute medical illness and volume depletion (high ostomy output) -08/23/21--new SVT up to 160s -appreciate cardiology consult -08/11/21 TSH 0.632 -D-dimer noted to be 5.71>>>CT angiogram negative for PE -1/29 Echo 50-55%, no WMA, trivial MR, no AS -Started on metoprolol 1/30 after discussion with cardiology -poor candidate for long term [facility] -personally reviewed EKG--sinus, TWI I,II, aVL, V4-V6   Moderate protein calorie malnutrition -NG tube has been removed 2/7; patient advanced to clear liquid diet>>full liquids -Follow clinical response and continue supportive care. -So far well-tolerated.   FOBT positive -Continue to monitor hemoglobin trend; no overt bleeding appreciated. -Continue treatment for PPI bid -Patient will benefit outpatient follow-up with gastroenterology service.   History of PUD/alcoholic gastritis -Continue the use of PPI; still given IV due to ongoing n.p.o. status.   Alcoholic liver disease -Continue CIWA protocol -  No active withdrawal symptoms appreciated currently. -Cessation counseling  provided. -Continue to follow LFTs.   Leukocytosis/Fever -In the setting of pancolitis  -finished 10 days cipro/flagyl on 2/5 -fever 100.7 on evening 2/7 -check blood cultures--neg to date -UA--no pyuria -PCT--10.54 -Lactate--1.5 -personally reviewed CXR--no infiltrates   Hypokalemia -In the setting of GI losses and NG tube -Continue to follow electrolytes and further replete as needed. -Patient also receiving TPN at this time. -corrected with supplementation and TPN   Hypomagnesemia -Magnesium 1.6>>1.9 -Continue repletion as needed and follow trend.           Status is: Inpatient   Remains inpatient appropriate because: remains on TPN, intolerant to po intake, persistent PSBO           Family Communication:   no Family at bedside   Consultants:  general surgery, cardiology   Code Status:  FULL   DVT Prophylaxis:  [place] Heparin      Procedures: As Listed in Progress Note Above   Antibiotics: Cipro/flagyl--finished 10 days on 2/8     Subjective: Patient states abdominal pain around parastomal hernia stable/improving.  Has nausea, no emesis.  Denies any cp, sob, cough, dysuria, headache  Objective: Vitals:   08/25/21 0300 08/25/21 0400 08/25/21 0530 08/25/21 0600  BP: 113/78 116/79  117/87  Pulse: 91 100  (!) 101  Resp: (!) 27 (!) 29  (!) 26  Temp:   (!) 97.5 F (36.4 C)   TempSrc:   Axillary   SpO2: 97% 98%  98%  Weight:   75.8 kg   Height:        Intake/Output Summary (Last 24 hours) at 08/25/2021 0840 Last data filed at 08/25/2021 0300 Gross per 24 hour  Intake 1477.76 ml  Output 2500 ml  Net -1022.24 ml   Weight change: 0.4 kg Exam:  General:  Pt is alert, follows commands appropriately, not in acute distress HEENT: No icterus, No thrush, No neck mass, [place]/AT Cardiovascular: RRR, S1/S2, no rubs, no gallops Respiratory: fine bibasilar crackles R>L Abdomen: Soft/+BS, LLQ tender around hernia, non distended, no guarding Extremities: trace LE  edema, No lymphangitis, No petechiae, No rashes, no synovitis   Data Reviewed: I have personally reviewed following labs and imaging studies Basic Metabolic Panel: Recent Labs  Lab 08/19/21 0405 08/20/21 0715 08/21/21 0626 08/22/21 0700 08/23/21 0817 08/24/21 0826 08/25/21 0609  NA 139   < > 138 135 132* 135 132*  K 2.7*   < > 3.8 3.9 4.2 4.0 4.8  CL 107   < > 100 99 95* 104 101  CO2 25   < > 31 30 30 26 27   GLUCOSE 110*   < > 142* 120* 134* 110* 124*  BUN 13   < > 6 7 7 7 8   CREATININE 0.56   < > 0.39* 0.33* 0.30* <0.30* 0.33*  CALCIUM 7.7*   < > 8.3* 8.1* 8.2* 8.1* 8.4*  MG 2.4  --   --  1.5* 1.6* 1.8 1.9  PHOS 3.4  --   --  3.1  --   --  3.9   < > = values in this interval not displayed.   Liver Function Tests: Recent Labs  Lab 08/19/21 0405 08/22/21 0700 08/24/21 0826 08/25/21 0609  AST 17 17 35 24  ALT 10 9 15 15   ALKPHOS 82 64 176* 129*  BILITOT 0.8 0.1* 0.5 0.5  PROT 5.5* 5.8* 6.1* 6.5  ALBUMIN 1.9* 1.8* 2.0* 2.2*   No results for  input(s): LIPASE, AMYLASE in the last 168 hours. No results for input(s): AMMONIA in the last 168 hours. Coagulation Profile: No results for input(s): INR, PROTIME in the last 168 hours. CBC: Recent Labs  Lab 08/20/21 0457 08/24/21 0404 08/25/21 0609  WBC 9.9 19.3* 10.9*  HGB 8.3* 8.7* 8.6*  HCT 26.9* 26.0* 26.2*  MCV 100.0 93.9 96.3  PLT 515* 376 300   Cardiac Enzymes: No results for input(s): CKTOTAL, CKMB, CKMBINDEX, TROPONINI in the last 168 hours. BNP: Invalid input(s): POCBNP CBG: Recent Labs  Lab 08/24/21 1134 08/24/21 1422 08/24/21 1713 08/24/21 1957 08/25/21 0550  GLUCAP 118* 109* 136* 107* 119*   HbA1C: No results for input(s): HGBA1C in the last 72 hours. Urine analysis:    Component Value Date/Time   COLORURINE YELLOW 08/24/2021 1620   APPEARANCEUR CLEAR 08/24/2021 1620   LABSPEC 1.013 08/24/2021 1620   PHURINE 7.0 08/24/2021 1620   GLUCOSEU NEGATIVE 08/24/2021 1620   HGBUR NEGATIVE  08/24/2021 1620   BILIRUBINUR NEGATIVE 08/24/2021 1620   KETONESUR NEGATIVE 08/24/2021 1620   PROTEINUR NEGATIVE 08/24/2021 1620   NITRITE NEGATIVE 08/24/2021 1620   LEUKOCYTESUR NEGATIVE 08/24/2021 1620   Sepsis Labs: @LABRCNTIP (procalcitonin:4,lacticidven:4) ) Recent Results (from the past 240 hour(s))  Culture, blood (routine x 2)     Status: None (Preliminary result)   Collection Time: 08/24/21  8:26 AM   Specimen: BLOOD LEFT HAND  Result Value Ref Range Status   Specimen Description BLOOD LEFT HAND  Final   Special Requests   Final    BOTTLES DRAWN AEROBIC AND ANAEROBIC Blood Culture results may not be optimal due to an excessive volume of blood received in culture bottles   Culture   Final    NO GROWTH <12 HOURS Performed at [facility], [address], [place], [place] [address]    Report Status PENDING  Incomplete  Culture, blood (routine x 2)     Status: None (Preliminary result)   Collection Time: 08/24/21  8:26 AM   Specimen: BLOOD LEFT ARM  Result Value Ref Range Status   Specimen Description BLOOD LEFT ARM  Final   Special Requests   Final    BOTTLES DRAWN AEROBIC AND ANAEROBIC Blood Culture results may not be optimal due to an excessive volume of blood received in culture bottles Performed at [facility], [address] [place], [place] [address]    Culture PENDING  Incomplete   Report Status PENDING  Incomplete     Scheduled Meds:  chlorhexidine  15 mL Mouth Rinse BID   Chlorhexidine Gluconate Cloth  6 each Topical Daily   feeding supplement  1 Container Oral TID BM   heparin  5,000 Units Subcutaneous Q8H   insulin aspart  0-15 Units Subcutaneous Q8H   mouth rinse  15 mL Mouth Rinse q12n4p   metoprolol tartrate  2.5 mg Intravenous Q6H   pantoprazole (PROTONIX) IV  40 mg Intravenous Q12H   sodium chloride flush  10-40 mL Intracatheter Q12H   Continuous Infusions:  methocarbamol (ROBAXIN) IV Stopped (08/18/21 0730)   TPN ADULT (ION) 80 mL/hr at 08/25/21  [id number]    Procedures/Studies: DG Chest 1 View  Result Date: 08/17/2021 CLINICAL DATA:  Nasogastric tube placement. EXAM: CHEST  1 VIEW COMPARISON:  04/21/2021.  Chest CTA dated 08/12/2021. FINDINGS: Very poor depth of inspiration with no gross change in mild elevation of the right hemidiaphragm. Mild bibasilar atelectasis. Interval small, oval nodular density in the lateral aspect of the mid right lung. No nodule at  that location on the recent CTA. Normal sized heart. Unremarkable bones. IMPRESSION: 1. Very poor inspiration with mild bibasilar atelectasis. 2. Interval small nodular density in the lateral aspect of the mid right lung, not present on the CTA 5 days ago. This can be re-evaluated at the time of follow-up radiographs. Electronically Signed   By: Claudie Revering M.D.   On: 08/17/2021 10:51   DG Chest 2 View  Result Date: 08/24/2021 CLINICAL DATA:  Fever. EXAM: CHEST - 2 VIEW COMPARISON:  August 17, 2021. FINDINGS: The heart size and mediastinal contours are within normal limits. Right-sided PICC line is unchanged in position. Mild bibasilar subsegmental atelectasis is noted. The visualized skeletal structures are unremarkable. IMPRESSION: Mild bibasilar subsegmental atelectasis. Electronically Signed   By: Marijo Conception M.D.   On: 08/24/2021 10:05   DG Abd 1 View  Result Date: 08/24/2021 CLINICAL DATA:  Dehydration, vomiting, small-bowel obstruction, necrotizing fasciitis. Small bowel fluoroscopy exam yesterday. 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 EXAM: ABDOMEN - 1 VIEW COMPARISON:  Small-bowel follow-through 08/22/2021 FINDINGS: Residual contrast material is demonstrated in dilated small bowel with decompressed terminal ileum and contrast seen in the cecum. This is consistent with partial small bowel obstruction. Left lower quadrant ostomy with peristomal hernia suggested. No significant change since prior study. IMPRESSION: Residual contrast material in the small bowel and colon.  Changes are consistent with partial small bowel obstruction. Electronically Signed   By: Lucienne Capers M.D.   On: 08/24/2021 01:47   DG Abd 1 View  Result Date: 08/22/2021 CLINICAL DATA:  Small-bowel obstruction, history of colostomy EXAM: ABDOMEN - 1 VIEW COMPARISON:  Portable exam 0710 hours compared to 08/19/2021 FINDINGS: Persistent small bowel dilatation little changed. Paucity of colonic gas. No bowel wall thickening, or urinary tract calcification, or acute osseous findings. IMPRESSION: Persistent small bowel dilatation consistent with obstruction. Electronically Signed   By: Lavonia Dana M.D.   On: 08/22/2021 08:50   DG Abd 1 View  Result Date: 08/18/2021 CLINICAL DATA:  Large parastomal hernia.  Recurrent colitis. EXAM: ABDOMEN - 1 VIEW COMPARISON:  None. FINDINGS: Severe gaseous distension of the stomach. Insert gastric tube with the tip projecting over the stomach. Gaseous distension of the small bowel. No bowel dilatation to suggest obstruction. No evidence of pneumoperitoneum, portal venous gas or pneumatosis. No pathologic calcifications along the expected course of the ureters. No acute osseous abnormality. IMPRESSION: 1. Severe gaseous distension of the stomach with a nasogastric tube in place in the stomach. Gaseous distension of the small bowel. Overall findings concerning for persistent small bowel obstruction. Electronically Signed   By: Kathreen Devoid M.D.   On: 08/18/2021 10:03   DG Abd 1 View  Result Date: 08/17/2021 CLINICAL DATA:  Encounter for NG tube placement. Pt admitted for SBO, c/o SOB. Hx of HTN, pt has colostomy. EXAM: ABDOMEN - 1 VIEW COMPARISON:  CT abdomen pelvis 08/16/2021 FINDINGS: Enteric tube coursing below the hemidiaphragm with tip and side port overlying the gastric lumen. The stomach is dilated with gas. No radio-opaque calculi or other significant radiographic abnormality are seen. IMPRESSION: 1. Enteric tube in good position. 2. Obstructive bowel gas pattern  again noted. Electronically Signed   By: Iven Finn M.D.   On: 08/17/2021 23:52   CT Angio Chest Pulmonary Embolism (PE) W or WO Contrast  Result Date: 08/12/2021 CLINICAL DATA:  Tachycardia with elevated D-dimer levels. History of hypertension. Clinical concern for pulmonary embolism. EXAM: CT ANGIOGRAPHY CHEST WITH CONTRAST TECHNIQUE: Multidetector  CT imaging of the chest was performed using the standard protocol during bolus administration of intravenous contrast. Multiplanar CT image reconstructions and MIPs were obtained to evaluate the vascular anatomy. RADIATION DOSE REDUCTION: This exam was performed according to the departmental dose-optimization program which includes automated exposure control, adjustment of the mA and/or kV according to patient size and/or use of iterative reconstruction technique. CONTRAST:  72m OMNIPAQUE IOHEXOL 350 MG/ML SOLN COMPARISON:  Chest CT 01/13/2020. FINDINGS: Cardiovascular: The pulmonary arteries are suboptimally opacified with contrast due to preferential opacification of the aorta and mild breathing artifact. There is no evidence of acute pulmonary embolism. No acute systemic arterial abnormalities are identified. The heart size is stable. There is no pericardial effusion. Mediastinum/Nodes: There are no enlarged mediastinal, hilar or axillary lymph nodes. The thyroid gland, trachea and esophagus demonstrate no significant findings. Lungs/Pleura: The previously demonstrated bilateral pleural effusions have resolved, and there is improved aeration of both lungs. There is mild residual dependent atelectasis in both lungs. No airspace disease or suspicious pulmonary nodularity identified. Upper abdomen: The liver demonstrates diffusely decreased density consistent with steatosis. No focal abnormality identified. No acute findings are seen within the visualized upper abdomen. Musculoskeletal/Chest wall: There is no chest wall mass or suspicious osseous finding.  Generalized soft tissue edema present previously has improved. Review of the MIP images confirms the above findings. IMPRESSION: 1. No evidence of acute pulmonary embolism or other acute findings within the chest. 2. Interval resolution of previously demonstrated pleural effusions with improved atelectasis in both lungs. 3. Hepatic steatosis. Electronically Signed   By: WRichardean SaleM.D.   On: 08/12/2021 14:23   CT ABDOMEN PELVIS W CONTRAST  Result Date: 08/16/2021 CLINICAL DATA:  Abdominal pain EXAM: CT ABDOMEN AND PELVIS WITH CONTRAST TECHNIQUE: Multidetector CT imaging of the abdomen and pelvis was performed using the standard protocol following bolus administration of intravenous contrast. RADIATION DOSE REDUCTION: This exam was performed according to the departmental dose-optimization program which includes automated exposure control, adjustment of the mA and/or kV according to patient size and/or use of iterative reconstruction technique. CONTRAST:  106mOMNIPAQUE IOHEXOL 300 MG/ML  SOLN COMPARISON:  08/10/2021 FINDINGS: Lower chest: There are small patchy infiltrates in both lower lobes with interval worsening. Hepatobiliary: There is fatty infiltration in the liver. Gallbladder is unremarkable. Pancreas: There is pancreatic atrophy. No focal abnormality is seen. Spleen: Unremarkable. Adrenals/Urinary Tract: Adrenals are not enlarged. There is no hydronephrosis. There are no renal or ureteral stones. Urinary bladder is not distended. Stomach/Bowel: There is marked distention of stomach. There is fluid in the lumen of visualized lower thoracic esophagus, possibly suggesting reflux. There is abnormal dilation of small-bowel loops. Small bowel loops measure up to 4.8 cm. There is a ventral hernia containing portions of small bowel loops and colon in the left lower quadrant. There is transition in the diameter of small-bowel loops within the hernia. There is mild diffuse wall thickening in the visualized  portions of colon. There is liquid stool in the colon. There is decompression of sigmoid colon and rectum. Vascular/Lymphatic: Unremarkable. Reproductive: Other: There is no ascites or pneumoperitoneum. There is edema in subcutaneous plane in the abdominal wall, more so on the left side without loculated fluid collections. Musculoskeletal: Unremarkable. IMPRESSION: There is interval marked distention of stomach with fluid. There is abnormal dilation of proximal small bowel loops measuring up to 4.8 cm in diameter suggesting high-grade partial obstruction. Zone of transition in the small bowel loops appears to be within a ventral hernia  in the left lower quadrant. Partly decompressed distal small bowel loops are seen within the hernial sac in the left lower quadrant. There is wall thickening along with fluid in the lumen in the ascending, transverse and descending colon suggesting colitis. Wall thickening in the colon appears less prominent. There is decompression of sigmoid colon. Findings suggest colitis and partial obstruction of left colon within the hernial sac in the left lower quadrant. There is no hydronephrosis. There are patchy infiltrates in both lower lung fields suggesting atelectasis/pneumonia. There is fluid in the lumen of thoracic esophagus suggesting gastroesophageal reflux. Fatty liver. There is edema in subcutaneous plane, possibly suggesting anasarca. There are no loculated fluid collections in the abdominal wall. Other findings as described in the body of the report. Electronically Signed   By: Elmer Picker M.D.   On: 08/16/2021 12:45   CT ABDOMEN PELVIS W CONTRAST  Result Date: 08/10/2021 CLINICAL DATA:  Nausea vomiting. EXAM: CT ABDOMEN AND PELVIS WITH CONTRAST TECHNIQUE: Multidetector CT imaging of the abdomen and pelvis was performed using the standard protocol following bolus administration of intravenous contrast. RADIATION DOSE REDUCTION: This exam was performed according to  the departmental dose-optimization program which includes automated exposure control, adjustment of the mA and/or kV according to patient size and/or use of iterative reconstruction technique. CONTRAST:  148m OMNIPAQUE IOHEXOL 300 MG/ML  SOLN COMPARISON:  CT abdomen pelvis dated 04/09/2021. FINDINGS: Lower chest: The visualized lung bases are clear. No intra-abdominal free air or free fluid. Hepatobiliary: Diffuse fatty liver. No intrahepatic biliary dilatation. Small gallstone. No pericholecystic fluid or evidence of acute cholecystitis by CT. Pancreas: Unremarkable. No pancreatic ductal dilatation or surrounding inflammatory changes. Spleen: Normal in size without focal abnormality. Adrenals/Urinary Tract: The adrenal glands unremarkable. There is no hydronephrosis on either side. There is symmetric enhancement and excretion of contrast by both kidneys. The visualized ureters and urinary bladder appear unremarkable. 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 There is diffuse inflammatory changes and thickening of the colon consistent with colitis. There is no bowel obstruction. The appendix is normal. Vascular/Lymphatic: The abdominal aorta and IVC are unremarkable. No portal venous gas. Small scattered mesenteric lymph nodes. No adenopathy. Reproductive: The uterus is anteverted.  No adnexal masses. Other: Anterior pelvic wall incisional scar. Musculoskeletal: No acute osseous pathology. IMPRESSION: 1. Pancolitis. No bowel obstruction. Normal appendix. 2. Fatty liver. 3. Cholelithiasis. Electronically Signed   By: AAnner CreteM.D.   On: 08/10/2021 02:25   UKoreaAbdomen Limited  Result Date: 08/10/2021 CLINICAL DATA:  Evaluate for abscess EXAM: ULTRASOUND ABDOMEN LIMITED COMPARISON:  None. FINDINGS: Limited ultrasound of the area around the stoma demonstrates normal appearing bowel loops with no evidence of organized fluid collection. IMPRESSION: No  evidence abscess. Electronically Signed   By: LYetta GlassmanM.D.   On: 08/10/2021 14:34   [place]Venous Img Upper Uni Right(DVT)  Result Date: 08/17/2021 CLINICAL DATA:  51year old female with elevated D-dimer, concern for right upper extremity deep vein thrombosis. EXAM: RIGHT UPPER EXTREMITY VENOUS DOPPLER ULTRASOUND TECHNIQUE: Gray-scale sonography with graded compression, as well as color Doppler and duplex ultrasound were performed to evaluate the upper extremity deep venous system from the level of the subclavian vein and including the jugular, axillary, basilic, radial, ulnar and upper cephalic vein. Spectral Doppler was utilized to evaluate flow at rest and with distal augmentation maneuvers. COMPARISON:  None. FINDINGS: Contralateral Subclavian Vein: Respiratory phasicity is normal and symmetric with the symptomatic side. No evidence of  thrombus. Normal compressibility. Internal Jugular Vein: No evidence of thrombus. Normal compressibility, respiratory phasicity and response to augmentation. Subclavian Vein: No evidence of thrombus. Normal compressibility, respiratory phasicity and response to augmentation. Axillary Vein: No evidence of thrombus. Normal compressibility, respiratory phasicity and response to augmentation. Cephalic Vein: No evidence of thrombus. Normal compressibility, respiratory phasicity and response to augmentation. Basilic Vein: No evidence of thrombus. Normal compressibility, respiratory phasicity and response to augmentation. Brachial Veins: No evidence of thrombus. Normal compressibility, respiratory phasicity and response to augmentation. Radial Veins: No evidence of thrombus. Normal compressibility, respiratory phasicity and response to augmentation. Ulnar Veins: No evidence of thrombus. Normal compressibility, respiratory phasicity and response to augmentation. Other Findings:  None visualized. IMPRESSION: No evidence of deep vein thrombosis within the right upper extremity.  Ruthann Cancer, MD [facility] Electronically Signed   By: Ruthann Cancer M.D.   On: 08/17/2021 07:38   DG CHEST PORT 1 VIEW  Result Date: 08/17/2021 CLINICAL DATA:  NG tube placement. EXAM: PORTABLE CHEST 1 VIEW COMPARISON:  Chest x-ray 08/17/2021. FINDINGS: Enteric tube tip extends into the stomach, distal tip is not included, but is likely beyond the mid stomach. There is gastric dilatation as seen on prior CT. There is left basilar atelectasis. Right-sided central venous catheter tip projects over the distal SVC. Cardiomediastinal silhouette is unchanged, the heart is enlarged. There is no pneumothorax or acute fracture. IMPRESSION: 1. NG tube reaches the mid stomach. The distal tip is not included on this image. 2. Stable dilated stomach. Electronically Signed   By: Ronney Asters M.D.   On: 08/17/2021 23:44   DG Abd Portable 1V  Result Date: 08/19/2021 CLINICAL DATA:  OG tube placement EXAM: PORTABLE ABDOMEN - 1 VIEW COMPARISON:  Plain film of the abdomen dated 08/18/2021. CT abdomen dated 08/16/2021. FINDINGS: The enteric tube is no longer visualized, presumably retracted to the level of the upper chest or beyond. Continued evidence of small-bowel obstruction with dilated gas-filled small bowel loops throughout the central abdomen. No evidence of free intraperitoneal air. IMPRESSION: 1. The enteric tube is no longer visualized, presumably retracted to the level of the upper chest or beyond the upper chest. Recommend repositioning into the stomach. 2. Continued evidence of small-bowel obstruction. Electronically Signed   By: Franki Cabot M.D.   On: 08/19/2021 13:08   ECHOCARDIOGRAM LIMITED  Result Date: 08/15/2021    ECHOCARDIOGRAM LIMITED REPORT   Patient Name:   Feven ALLESSANDRA BERNARDI Date of Exam: 08/14/2021 Medical Rec #:  [id number]       Height:       58.0 in Accession #:    [id number]      Weight:       187.4 lb Date of Birth:  1970-10-21        BSA:          1.771 m Patient Age:    26 years        BP:           132/91 mmHg Patient Gender: F               HR:           124 bpm. Exam Location:  Inpatient Procedure: Limited Echo, Limited Color Doppler and Cardiac Doppler Indications:     abnormal ecg  History:         Patient has prior history of Echocardiogram examinations, most  recent 04/22/2021. Risk Factors:Hypertension.  Sonographer:     Johny Chess RDCS Referring Phys:  [id number] [place] Diagnosing Phys: Gwyndolyn Kaufman MD IMPRESSIONS  1. Left ventricular ejection fraction, by estimation, is 50 to 55%. The left ventricle has low normal function. The left ventricle has no regional wall motion abnormalities.  2. The right ventricular size is normal. There is normal pulmonary artery systolic pressure. The estimated right ventricular systolic pressure is 60.7 mmHg.  3. The mitral valve is grossly normal. Trivial mitral valve regurgitation.  4. The aortic valve was not well visualized. Aortic valve regurgitation is not visualized. No aortic stenosis is present.  5. The inferior vena cava is normal in size with greater than 50% respiratory variability, suggesting right atrial pressure of 3 mmHg.  6. Tachycardiac throughout study. Comparison(s): Compared to prior TTE in 04/2021, the EF appears slightly lower at ~55% (previously 60-65%) but patient is notably more tachycardic during current study. FINDINGS  Left Ventricle: Left ventricular ejection fraction, by estimation, is 50 to 55%. The left ventricle has low normal function. The left ventricle has no regional wall motion abnormalities. The left ventricular internal cavity size was normal in size. There is no left ventricular hypertrophy. Right Ventricle: The right ventricular size is normal. There is normal pulmonary artery systolic pressure. The tricuspid regurgitant velocity is 2.59 m/s, and with an assumed right atrial pressure of 3 mmHg, the estimated right ventricular  systolic pressure is 37.1 mmHg. Left Atrium: Left atrial size was normal in size. Right Atrium: Right atrial size was normal in size. Pericardium: There is no evidence of pericardial effusion. Mitral Valve: The mitral valve is grossly normal. There is mild thickening of the mitral valve leaflet(s). There is mild calcification of the mitral valve leaflet(s). Trivial mitral valve regurgitation. Tricuspid Valve: The tricuspid valve is normal in structure. Tricuspid valve regurgitation is mild. Aortic Valve: The aortic valve was not well visualized. Aortic valve regurgitation is not visualized. No aortic stenosis is present. Pulmonic Valve: The pulmonic valve was normal in structure. Pulmonic valve regurgitation is trivial. Venous: The inferior vena cava is normal in size with greater than 50% respiratory variability, suggesting right atrial pressure of 3 mmHg. IAS/Shunts: The atrial septum is grossly normal. LEFT VENTRICLE PLAX 2D LVIDd:         5.10 cm LVIDs:         3.60 cm  LV Volumes (MOD) LV vol d, MOD A4C: 81.9 ml LV vol s, MOD A4C: 45.7 ml LV SV MOD A4C:     81.9 ml AORTIC VALVE LVOT Vmax:   93.30 cm/s LVOT Vmean:  58.000 cm/s LVOT VTI:    0.110 m TRICUSPID VALVE TR Peak grad:   26.8 mmHg TR Vmax:        259.00 cm/s  SHUNTS Systemic VTI: 0.11 m Gwyndolyn Kaufman MD Electronically signed by Gwyndolyn Kaufman MD Signature Date/Time: 08/15/2021/10:07:59 AM    Final (Updated)    [place] EKG SITE RITE  Result Date: 08/17/2021 If Site Rite image not attached, placement could not be confirmed due to current cardiac rhythm.  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 Images obtained: 7, over 16 hours FINDINGS:  Poor emptying of tracer from the stomach, potentially related to patient being supine for prolonged periods. Proximal small bowel loops normal appearance. Dilated small bowel loops are seen in the hernia sac adjacent to the colostomy. However contrast passes beyond these into distal small bowel. Several of the small bowel loops immediately proximal to the hernia sac are significantly dilated however the course of the exam. However contrast passes beyond the parastomal herniation and into the RIGHT colon, which is decompressed. Findings are consistent with a partial small bowel obstruction at the level of the parastomal hernia. IMPRESSION: Partial small bowel obstruction at the level of the parastomal hernia, with several dilated loops proximal to the hernia as well as within the hernia sac, though contrast is able to pass beyond into nondistended distal small bowel loops and on to the RIGHT colon. Electronically Signed   By: Lavonia Dana M.D.   On: 08/23/2021 09:11    Orson Eva, DO  [facility]  If 7PM-7AM, please contact night-coverage [email or web address] Password [email or web address] 08/25/2021, 8:40 AM   LOS: 14 days

## 2021-08-25 NOTE — Progress Notes (Signed)
PHARMACY - TOTAL PARENTERAL NUTRITION CONSULT NOTE   Indication: Small bowel obstruction  Patient Measurements: Height: 4' 10"  (147.3 cm) Weight: 75.8 kg (167 lb 1.7 oz) IBW/kg (Calculated) : 40.9 TPN AdjBW (KG): 49 Body mass index is 34.93 kg/m.  Assessment:  51 yo female with medical history significant for large parastomal hernia and recent colitis with high output from her ostomy now with concern for SBO. Repeat CT 01/31 verified partial bowel obstruction. Patient remains NPO. Pharmacy consulted to initiate TPN. Patient tolerating TPN. NG output going down  Glucose / Insulin:  107-136, 0 units insulin Electrolytes: Na 132 potassium 4.0 > 4.8 WNL for others Renal: Scr 0.56 Hepatic: WNL   Central access: PICC 2/1 TPN start date: 2/2  Nutritional Goals: Goal TPN rate is 80 mL/hr (provides 92 g of protein and 1667 kcals per day)  RD Assessment: Estimated Needs Total Energy Estimated Needs: 1500-1700 Total Protein Estimated Needs: 90-99 gr Total Fluid Estimated Needs: >1700 ml daily  Current Nutrition:  Patient started on Boost TID  Plan:  Continue TPN to 24m/hr at 1800 Electrolytes in TPN: Na 7107m/L, K 4027mL, Ca 5mE76m, Mg 10mE19m and Phos 15mmo58m Cl:Ac 1:2 Add standard MVI, thiamine, folic acid, and trace elements to TPN  Moderate q8h SSI and adjust as needed  Monitor TPN labs on Mon/Thurs,   StevenMargot AblesmD Clinical Pharmacist 08/25/2021 8:00 AM

## 2021-08-26 ENCOUNTER — Inpatient Hospital Stay (HOSPITAL_COMMUNITY): Payer: Self-pay

## 2021-08-26 DIAGNOSIS — I4892 Unspecified atrial flutter: Secondary | ICD-10-CM

## 2021-08-26 LAB — GLUCOSE, CAPILLARY: Glucose-Capillary: 105 mg/dL — ABNORMAL HIGH (ref 70–99)

## 2021-08-26 LAB — URINE CULTURE: Culture: 50000 — AB

## 2021-08-26 MED ORDER — TRAVASOL 10 % IV SOLN
INTRAVENOUS | Status: DC
  Filled 2021-08-26: qty 921.6

## 2021-08-26 NOTE — Progress Notes (Signed)
PROGRESS NOTE  Alyssa Carey SWH:[id number] DOB: February 18, 1971 DOA: 08/09/2021 PCP: Caryl Bis, MD   Brief History:  Alyssa Carey  is a 51 y.o. 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  Patient was admitted with pancolitis and associated dehydration as well as electrolyte abnormalities with hypokalemia and hypomagnesemia in the setting of ongoing high output/diarrhea.  She has been started on ciprofloxacin and Flagyl empirically on 1/25 and GI pathogen panel as well as C. difficile testing has been ordered which have now returned negative.  General surgery had initially evaluated patient on account of the parastomal hernia, but patient was improving and therefore they had signed off.  She now is having some persistent nausea and vomiting along with abdominal pain and repeat CT imaging on 1/31 demonstrates high-grade bowel obstruction findings for which general surgery has been reconsulted and has recommended patient to be n.p.o. and to place NG tube.  Pt began to improve clinically after NG placement with decreased ostomy output and improved abd pain.  NG was subsequently removed 08/23/21 and pt started on clears Pt developed SVT evening of 08/23/21 and was moved to stepdown unit after adenosine 6 mg x2 and given 2 additional doses of IV lopressor.  Cardiology was consulted and felt patient had aflutter vs SVTand agreed with present management.  Echo (see below) and TSH were reassuring.   Assessment/Plan: Pancolitis -resolved clinically -Last day of ciprofloxacin and Flagyl (patient completed 10 days of antibiotic therapy). -GI pathogen panel negative and C. difficile testing negative -No recent use of antibiotics prior to development   New findings of high-grade small bowel obstruction in the setting of parastomal hernia -08/15/21 CT abd--high grade SBO -2/7/23SBFT  demonstrating contrast has moved through most of the small bowel and has been able to pass through hernia process.  -NG tube has been discontinued; clear liquid diet  started 2/7 -Patient advised to increase physical activity and to sit most of the time in the chair to further facilitate bowel peristalsis.  -Based on recent images findings patient now with contrast passing into distal SB and right colon -Continuing TPN for now per surgery -08/26/21 AAS--Small bowel dilatation has improved, although there is a single remaining dilated small bowel loop in the central abdomen measuring up to 4.3 cm in diameter. Oral contrast throughout the colon and in the left lower quadrant ostomy bag -appreciate general surgery recommendations -increase activity>>PT eval -OOB and up in chair each shift   Dehydration secondary to above -high ostomy output at time of admission, now decreasing oral intake and NG tube usage until 08/23/2021 -NG tube has been removed; diet will be advanced as tolerated starting with clear liquids>>full liquids -Continue nutritional supplements -TPN per surgery   Narrow complex tachycardia/Paroxsymal SVT/Aflutter -HR had been in 110s due to acute medical illness and volume depletion (high ostomy output) -08/23/21--new SVT up to 160s -appreciate cardiology consult -08/11/21 TSH 0.632 -D-dimer noted to be 5.71>>>CT angiogram negative for PE -1/29 Echo 50-55%, no WMA, trivial MR, no AS -appreciate cardiology follow up -poor candidate for long term AC -personally reviewed EKG--sinus, TWI I,II, aVL, V4-V6   Moderate protein calorie malnutrition -NG tube has been removed 2/7; patient advanced to clear liquid diet>>full liquids -Follow clinical response and continue supportive care. -So far well-tolerated.   FOBT positive -Continue to monitor hemoglobin trend; no  overt bleeding appreciated. -Continue treatment for PPI bid -Patient will benefit outpatient follow-up with  gastroenterology service. -Hgb remains stable   History of PUD/alcoholic gastritis -Continue the use of PPI; still given IV due to ongoing n.p.o. status.   Alcoholic liver disease -Continue CIWA protocol -No active withdrawal symptoms appreciated currently. -Cessation counseling provided. -Continue to follow LFTs.   Leukocytosis/Fever -In the setting of pancolitis  -finished 10 days cipro/flagyl on 2/5 -fever 100.7 on evening 2/7 -check blood cultures--neg to date -UA--no pyuria -PCT--10.54 -Lactate--1.5 -personally reviewed CXR--no infiltrates -overall resolved   Hypokalemia -In the setting of GI losses and NG tube -Continue to follow electrolytes and further replete as needed. -Patient also receiving TPN at this time. -corrected with supplementation and TPN   Hypomagnesemia -Magnesium 1.6>>1.9 -Continue repletion as needed and follow trend.           Status is: Inpatient   Remains inpatient appropriate because: remains on TPN, intolerant to po intake, persistent PSBO           Family Communication:   no Family at bedside   Consultants:  general surgery, cardiology   Code Status:  FULL   DVT Prophylaxis:  [place] Heparin      Procedures: As Listed in Progress Note Above   Antibiotics: Cipro/flagyl--finished 10 days on 2/8   Subjective: Patient is feeling better today.  Tolerating full liquids without n/v.  Overall, abd pain is improving although she still has pain in parastomal hernia area which she states is also improving.  Denies cp, sob, dizziness  Objective: Vitals:   08/26/21 0800 08/26/21 0840 08/26/21 0900 08/26/21 1000  BP:  111/77    Pulse: (!) 109 (!) 108  94  Resp: (!) 33 15 (!) 28 (!) 25  Temp: 97.9 F (36.6 C)     TempSrc: Oral     SpO2: 100% 97%  98%  Weight:      Height:        Intake/Output Summary (Last 24 hours) at 08/26/2021 1138 Last data filed at 08/26/2021 0630 Gross per 24 hour  Intake 1932.61 ml  Output 2875 ml  Net  -942.39 ml   Weight change: -0.1 kg Exam:  General:  Pt is alert, follows commands appropriately, not in acute distress HEENT: No icterus, No thrush, No neck mass, [place]/AT Cardiovascular: RRR, S1/S2, no rubs, no gallops Respiratory: CTA bilaterally, no wheezing, no crackles, no rhonchi Abdomen: Soft/+BS,  tender over LLQ hernia, non distended, no guarding Extremities: No edema, No lymphangitis, No petechiae, No rashes, no synovitis   Data Reviewed: I have personally reviewed following labs and imaging studies Basic Metabolic Panel: Recent Labs  Lab 08/21/21 0626 08/22/21 0700 08/23/21 0817 08/24/21 0826 08/25/21 0609  NA 138 135 132* 135 132*  K 3.8 3.9 4.2 4.0 4.8  CL 100 99 95* 104 101  CO2 31 30 30 26 27   GLUCOSE 142* 120* 134* 110* 124*  BUN 6 7 7 7 8   CREATININE 0.39* 0.33* 0.30* <0.30* 0.33*  CALCIUM 8.3* 8.1* 8.2* 8.1* 8.4*  MG  --  1.5* 1.6* 1.8 1.9  PHOS  --  3.1  --   --  3.9   Liver Function Tests: Recent Labs  Lab 08/22/21 0700 08/24/21 0826 08/25/21 0609  AST 17 35 24  ALT 9 15 15   ALKPHOS 64 176* 129*  BILITOT 0.1* 0.5 0.5  PROT 5.8* 6.1* 6.5  ALBUMIN 1.8* 2.0* 2.2*   No results for input(s): LIPASE, AMYLASE in the last  168 hours. No results for input(s): AMMONIA in the last 168 hours. Coagulation Profile: No results for input(s): INR, PROTIME in the last 168 hours. CBC: Recent Labs  Lab 08/20/21 0457 08/24/21 0404 08/25/21 0609  WBC 9.9 19.3* 10.9*  HGB 8.3* 8.7* 8.6*  HCT 26.9* 26.0* 26.2*  MCV 100.0 93.9 96.3  PLT 515* 376 300   Cardiac Enzymes: No results for input(s): CKTOTAL, CKMB, CKMBINDEX, TROPONINI in the last 168 hours. BNP: Invalid input(s): POCBNP CBG: Recent Labs  Lab 08/24/21 1957 08/25/21 0550 08/25/21 1416 08/25/21 2117 08/26/21 0619  GLUCAP 107* 119* 110* 113* 105*   HbA1C: No results for input(s): HGBA1C in the last 72 hours. Urine analysis:    Component Value Date/Time   COLORURINE YELLOW 08/24/2021 1620    APPEARANCEUR CLEAR 08/24/2021 1620   LABSPEC 1.013 08/24/2021 1620   PHURINE 7.0 08/24/2021 1620   GLUCOSEU NEGATIVE 08/24/2021 1620   HGBUR NEGATIVE 08/24/2021 1620   BILIRUBINUR NEGATIVE 08/24/2021 1620   KETONESUR NEGATIVE 08/24/2021 1620   PROTEINUR NEGATIVE 08/24/2021 1620   NITRITE NEGATIVE 08/24/2021 1620   LEUKOCYTESUR NEGATIVE 08/24/2021 1620   Sepsis Labs: @LABRCNTIP (procalcitonin:4,lacticidven:4) ) Recent Results (from the past 240 hour(s))  Culture, blood (routine x 2)     Status: None (Preliminary result)   Collection Time: 08/24/21  8:26 AM   Specimen: BLOOD LEFT HAND  Result Value Ref Range Status   Specimen Description BLOOD LEFT HAND  Final   Special Requests   Final    BOTTLES DRAWN AEROBIC AND ANAEROBIC Blood Culture results may not be optimal due to an excessive volume of blood received in culture bottles   Culture   Final    NO GROWTH 2 DAYS Performed at [facility], [address] [place], [place] [address]    Report Status PENDING  Incomplete  Culture, blood (routine x 2)     Status: None (Preliminary result)   Collection Time: 08/24/21  8:26 AM   Specimen: BLOOD LEFT ARM  Result Value Ref Range Status   Specimen Description BLOOD LEFT ARM  Final   Special Requests   Final    BOTTLES DRAWN AEROBIC AND ANAEROBIC Blood Culture results may not be optimal due to an excessive volume of blood received in culture bottles   Culture   Final    NO GROWTH 2 DAYS Performed at [facility], [address], [place], [place] [address]    Report Status PENDING  Incomplete  Urine Culture     Status: None (Preliminary result)   Collection Time: 08/24/21  4:20 PM   Specimen: Urine, Clean Catch  Result Value Ref Range Status   Specimen Description   Final    URINE, CLEAN CATCH Performed at [facility], [address], [place], [place] [address]    Special Requests   Final    NONE Performed at [facility], [address], [place], [place] [address]    Culture    Final    CULTURE REINCUBATED FOR BETTER GROWTH Performed at [facility], [place] [address] [place], [place] [address]    Report Status PENDING  Incomplete     Scheduled Meds:  chlorhexidine  15 mL Mouth Rinse BID   Chlorhexidine Gluconate Cloth  6 each Topical Daily   feeding supplement  1 Container Oral TID BM   heparin  5,000 Units Subcutaneous Q8H   mouth rinse  15 mL Mouth Rinse q12n4p   metoprolol tartrate  2.5 mg Intravenous Q6H   pantoprazole (  PROTONIX) IV  40 mg Intravenous Q12H   psyllium  1 packet Oral Daily   sodium chloride flush  10-40 mL Intracatheter Q12H   Continuous Infusions:  methocarbamol (ROBAXIN) IV Stopped (08/18/21 0730)   TPN ADULT (ION) 80 mL/hr at 08/25/21 1807   TPN ADULT (ION)      Procedures/Studies: DG Chest 1 View  Result Date: 08/17/2021 CLINICAL DATA:  Nasogastric tube placement. EXAM: CHEST  1 VIEW COMPARISON:  04/21/2021.  Chest CTA dated 08/12/2021. FINDINGS: Very poor depth of inspiration with no gross change in mild elevation of the right hemidiaphragm. Mild bibasilar atelectasis. Interval small, oval nodular density in the lateral aspect of the mid right lung. No nodule at that location on the recent CTA. Normal sized heart. Unremarkable bones. IMPRESSION: 1. Very poor inspiration with mild bibasilar atelectasis. 2. Interval small nodular density in the lateral aspect of the mid right lung, not present on the CTA 5 days ago. This can be re-evaluated at the time of follow-up radiographs. Electronically Signed   By: Claudie Revering M.D.   On: 08/17/2021 10:51   DG Chest 2 View  Result Date: 08/24/2021 CLINICAL DATA:  Fever. EXAM: CHEST - 2 VIEW COMPARISON:  August 17, 2021. FINDINGS: The heart size and mediastinal contours are within normal limits. Right-sided PICC line is unchanged in position. Mild bibasilar subsegmental atelectasis is noted. The visualized skeletal structures are unremarkable. IMPRESSION: Mild bibasilar subsegmental  atelectasis. Electronically Signed   By: Marijo Conception M.D.   On: 08/24/2021 10:05   DG Abd 1 View  Result Date: 08/24/2021 CLINICAL DATA:  Dehydration, vomiting, small-bowel obstruction, necrotizing fasciitis. Small bowel fluoroscopy exam yesterday. 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 EXAM: ABDOMEN - 1 VIEW COMPARISON:  Small-bowel follow-through 08/22/2021 FINDINGS: Residual contrast material is demonstrated in dilated small bowel with decompressed terminal ileum and contrast seen in the cecum. This is consistent with partial small bowel obstruction. Left lower quadrant ostomy with peristomal hernia suggested. No significant change since prior study. IMPRESSION: Residual contrast material in the small bowel and colon. Changes are consistent with partial small bowel obstruction. Electronically Signed   By: Lucienne Capers M.D.   On: 08/24/2021 01:47   DG Abd 1 View  Result Date: 08/22/2021 CLINICAL DATA:  Small-bowel obstruction, history of colostomy EXAM: ABDOMEN - 1 VIEW COMPARISON:  Portable exam 0710 hours compared to 08/19/2021 FINDINGS: Persistent small bowel dilatation little changed. Paucity of colonic gas. No bowel wall thickening, or urinary tract calcification, or acute osseous findings. IMPRESSION: Persistent small bowel dilatation consistent with obstruction. Electronically Signed   By: Lavonia Dana M.D.   On: 08/22/2021 08:50   DG Abd 1 View  Result Date: 08/18/2021 CLINICAL DATA:  Large parastomal hernia.  Recurrent colitis. EXAM: ABDOMEN - 1 VIEW COMPARISON:  None. FINDINGS: Severe gaseous distension of the stomach. Insert gastric tube with the tip projecting over the stomach. Gaseous distension of the small bowel. No bowel dilatation to suggest obstruction. No evidence of pneumoperitoneum, portal venous gas or pneumatosis. No pathologic calcifications along the expected course of the ureters. No acute osseous abnormality. IMPRESSION: 1. Severe gaseous distension of the  stomach with a nasogastric tube in place in the stomach. Gaseous distension of the small bowel. Overall findings concerning for persistent small bowel obstruction. Electronically Signed   By: Kathreen Devoid M.D.   On: 08/18/2021 10:03   DG Abd 1 View  Result Date: 08/17/2021 CLINICAL DATA:  Encounter for NG tube placement. Pt admitted  for SBO, c/o SOB. Hx of HTN, pt has colostomy. EXAM: ABDOMEN - 1 VIEW COMPARISON:  CT abdomen pelvis 08/16/2021 FINDINGS: Enteric tube coursing below the hemidiaphragm with tip and side port overlying the gastric lumen. The stomach is dilated with gas. No radio-opaque calculi or other significant radiographic abnormality are seen. IMPRESSION: 1. Enteric tube in good position. 2. Obstructive bowel gas pattern again noted. Electronically Signed   By: Iven Finn M.D.   On: 08/17/2021 23:52   CT Angio Chest Pulmonary Embolism (PE) W or WO Contrast  Result Date: 08/12/2021 CLINICAL DATA:  Tachycardia with elevated D-dimer levels. History of hypertension. Clinical concern for pulmonary embolism. EXAM: CT ANGIOGRAPHY CHEST WITH CONTRAST TECHNIQUE: Multidetector CT imaging of the chest was performed using the standard protocol during bolus administration of intravenous contrast. Multiplanar CT image reconstructions and MIPs were obtained to evaluate the vascular anatomy. RADIATION DOSE REDUCTION: This exam was performed according to the departmental dose-optimization program which includes automated exposure control, adjustment of the mA and/or kV according to patient size and/or use of iterative reconstruction technique. CONTRAST:  34m OMNIPAQUE IOHEXOL 350 MG/ML SOLN COMPARISON:  Chest CT 01/13/2020. FINDINGS: Cardiovascular: The pulmonary arteries are suboptimally opacified with contrast due to preferential opacification of the aorta and mild breathing artifact. There is no evidence of acute pulmonary embolism. No acute systemic arterial abnormalities are identified. The heart  size is stable. There is no pericardial effusion. Mediastinum/Nodes: There are no enlarged mediastinal, hilar or axillary lymph nodes. The thyroid gland, trachea and esophagus demonstrate no significant findings. Lungs/Pleura: The previously demonstrated bilateral pleural effusions have resolved, and there is improved aeration of both lungs. There is mild residual dependent atelectasis in both lungs. No airspace disease or suspicious pulmonary nodularity identified. Upper abdomen: The liver demonstrates diffusely decreased density consistent with steatosis. No focal abnormality identified. No acute findings are seen within the visualized upper abdomen. Musculoskeletal/Chest wall: There is no chest wall mass or suspicious osseous finding. Generalized soft tissue edema present previously has improved. Review of the MIP images confirms the above findings. IMPRESSION: 1. No evidence of acute pulmonary embolism or other acute findings within the chest. 2. Interval resolution of previously demonstrated pleural effusions with improved atelectasis in both lungs. 3. Hepatic steatosis. Electronically Signed   By: WRichardean SaleM.D.   On: 08/12/2021 14:23   CT ABDOMEN PELVIS W CONTRAST  Result Date: 08/16/2021 CLINICAL DATA:  Abdominal pain EXAM: CT ABDOMEN AND PELVIS WITH CONTRAST TECHNIQUE: Multidetector CT imaging of the abdomen and pelvis was performed using the standard protocol following bolus administration of intravenous contrast. RADIATION DOSE REDUCTION: This exam was performed according to the departmental dose-optimization program which includes automated exposure control, adjustment of the mA and/or kV according to patient size and/or use of iterative reconstruction technique. CONTRAST:  1036mOMNIPAQUE IOHEXOL 300 MG/ML  SOLN COMPARISON:  08/10/2021 FINDINGS: Lower chest: There are small patchy infiltrates in both lower lobes with interval worsening. Hepatobiliary: There is fatty infiltration in the liver.  Gallbladder is unremarkable. Pancreas: There is pancreatic atrophy. No focal abnormality is seen. Spleen: Unremarkable. Adrenals/Urinary Tract: Adrenals are not enlarged. There is no hydronephrosis. There are no renal or ureteral stones. Urinary bladder is not distended. Stomach/Bowel: There is marked distention of stomach. There is fluid in the lumen of visualized lower thoracic esophagus, possibly suggesting reflux. There is abnormal dilation of small-bowel loops. Small bowel loops measure up to 4.8 cm. There is a ventral hernia containing portions of small bowel loops and colon  in the left lower quadrant. There is transition in the diameter of small-bowel loops within the hernia. There is mild diffuse wall thickening in the visualized portions of colon. There is liquid stool in the colon. There is decompression of sigmoid colon and rectum. Vascular/Lymphatic: Unremarkable. Reproductive: Other: There is no ascites or pneumoperitoneum. There is edema in subcutaneous plane in the abdominal wall, more so on the left side without loculated fluid collections. Musculoskeletal: Unremarkable. IMPRESSION: There is interval marked distention of stomach with fluid. There is abnormal dilation of proximal small bowel loops measuring up to 4.8 cm in diameter suggesting high-grade partial obstruction. Zone of transition in the small bowel loops appears to be within a ventral hernia in the left lower quadrant. Partly decompressed distal small bowel loops are seen within the hernial sac in the left lower quadrant. There is wall thickening along with fluid in the lumen in the ascending, transverse and descending colon suggesting colitis. Wall thickening in the colon appears less prominent. There is decompression of sigmoid colon. Findings suggest colitis and partial obstruction of left colon within the hernial sac in the left lower quadrant. There is no hydronephrosis. There are patchy infiltrates in both lower lung fields  suggesting atelectasis/pneumonia. There is fluid in the lumen of thoracic esophagus suggesting gastroesophageal reflux. Fatty liver. There is edema in subcutaneous plane, possibly suggesting anasarca. There are no loculated fluid collections in the abdominal wall. Other findings as described in the body of the report. Electronically Signed   By: Elmer Picker M.D.   On: 08/16/2021 12:45   CT ABDOMEN PELVIS W CONTRAST  Result Date: 08/10/2021 CLINICAL DATA:  Nausea vomiting. EXAM: CT ABDOMEN AND PELVIS WITH CONTRAST TECHNIQUE: Multidetector CT imaging of the abdomen and pelvis was performed using the standard protocol following bolus administration of intravenous contrast. RADIATION DOSE REDUCTION: This exam was performed according to the departmental dose-optimization program which includes automated exposure control, adjustment of the mA and/or kV according to patient size and/or use of iterative reconstruction technique. CONTRAST:  128m OMNIPAQUE IOHEXOL 300 MG/ML  SOLN COMPARISON:  CT abdomen pelvis dated 04/09/2021. FINDINGS: Lower chest: The visualized lung bases are clear. No intra-abdominal free air or free fluid. Hepatobiliary: Diffuse fatty liver. No intrahepatic biliary dilatation. Small gallstone. No pericholecystic fluid or evidence of acute cholecystitis by CT. Pancreas: Unremarkable. No pancreatic ductal dilatation or surrounding inflammatory changes. Spleen: Normal in size without focal abnormality. Adrenals/Urinary Tract: The adrenal glands unremarkable. There is no hydronephrosis on either side. There is symmetric enhancement and excretion of contrast by both kidneys. The visualized ureters and urinary bladder appear unremarkable. 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 There is diffuse inflammatory changes and thickening of the colon consistent with colitis. There is no bowel obstruction. The appendix is normal. Vascular/Lymphatic:  The abdominal aorta and IVC are unremarkable. No portal venous gas. Small scattered mesenteric lymph nodes. No adenopathy. Reproductive: The uterus is anteverted.  No adnexal masses. Other: Anterior pelvic wall incisional scar. Musculoskeletal: No acute osseous pathology. IMPRESSION: 1. Pancolitis. No bowel obstruction. Normal appendix. 2. Fatty liver. 3. Cholelithiasis. Electronically Signed   By: AAnner CreteM.D.   On: 08/10/2021 02:25   UKoreaAbdomen Limited  Result Date: 08/10/2021 CLINICAL DATA:  Evaluate for abscess EXAM: ULTRASOUND ABDOMEN LIMITED COMPARISON:  None. FINDINGS: Limited ultrasound of the area around the stoma demonstrates normal appearing bowel loops with no evidence of organized fluid collection. IMPRESSION: No evidence abscess. Electronically Signed   By:  Yetta Glassman M.D.   On: 08/10/2021 14:34   US Venous Img Upper Uni Right(DVT)  Result Date: 08/17/2021 CLINICAL DATA:  51 year old female with elevated D-dimer, concern for right upper extremity deep vein thrombosis. EXAM: RIGHT UPPER EXTREMITY VENOUS DOPPLER ULTRASOUND TECHNIQUE: Gray-scale sonography with graded compression, as well as color Doppler and duplex ultrasound were performed to evaluate the upper extremity deep venous system from the level of the subclavian vein and including the jugular, axillary, basilic, radial, ulnar and upper cephalic vein. Spectral Doppler was utilized to evaluate flow at rest and with distal augmentation maneuvers. COMPARISON:  None. FINDINGS: Contralateral Subclavian Vein: Respiratory phasicity is normal and symmetric with the symptomatic side. No evidence of thrombus. Normal compressibility. Internal Jugular Vein: No evidence of thrombus. Normal compressibility, respiratory phasicity and response to augmentation. Subclavian Vein: No evidence of thrombus. Normal compressibility, respiratory phasicity and response to augmentation. Axillary Vein: No evidence of thrombus. Normal compressibility,  respiratory phasicity and response to augmentation. Cephalic Vein: No evidence of thrombus. Normal compressibility, respiratory phasicity and response to augmentation. Basilic Vein: No evidence of thrombus. Normal compressibility, respiratory phasicity and response to augmentation. Brachial Veins: No evidence of thrombus. Normal compressibility, respiratory phasicity and response to augmentation. Radial Veins: No evidence of thrombus. Normal compressibility, respiratory phasicity and response to augmentation. Ulnar Veins: No evidence of thrombus. Normal compressibility, respiratory phasicity and response to augmentation. Other Findings:  None visualized. IMPRESSION: No evidence of deep vein thrombosis within the right upper extremity. Ruthann Cancer, MD [facility] Radiology Electronically Signed   By: Ruthann Cancer M.D.   On: 08/17/2021 07:38   DG CHEST PORT 1 VIEW  Result Date: 08/17/2021 CLINICAL DATA:  NG tube placement. EXAM: PORTABLE CHEST 1 VIEW COMPARISON:  Chest x-ray 08/17/2021. FINDINGS: Enteric tube tip extends into the stomach, distal tip is not included, but is likely beyond the mid stomach. There is gastric dilatation as seen on prior CT. There is left basilar atelectasis. Right-sided central venous catheter tip projects over the distal SVC. Cardiomediastinal silhouette is unchanged, the heart is enlarged. There is no pneumothorax or acute fracture. IMPRESSION: 1. NG tube reaches the mid stomach. The distal tip is not included on this image. 2. Stable dilated stomach. Electronically Signed   By: Ronney Asters M.D.   On: 08/17/2021 23:44   DG ABD ACUTE 2+V W 1V CHEST  Result Date: 08/26/2021 CLINICAL DATA:  Small bowel obstruction.  Emesis. EXAM: DG ABDOMEN ACUTE WITH 1 VIEW CHEST COMPARISON:  Chest and abdominal x-rays dated August 24, 2021. FINDINGS: Small bowel dilatation has improved, although there is a single remaining dilated small bowel  loop in the central abdomen measuring up to 4.3 cm in diameter. Oral contrast throughout the colon and in the left lower quadrant ostomy bag. 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 No pneumoperitoneum. No radiopaque calculi or other significant radiographic abnormality is seen. Unchanged right upper extremity PICC line. Stable cardiomediastinal silhouette with normal heart size. Continued low lung volumes with right greater than left basilar atelectasis. No focal consolidation, pleural effusion, or pneumothorax. IMPRESSION: 1. Improved partial small bowel obstruction. 2.  No active cardiopulmonary disease. Electronically Signed   By: Titus Dubin M.D.   On: 08/26/2021 07:55   DG Abd Portable 1V  Result Date: 08/19/2021 CLINICAL DATA:  OG tube placement EXAM: PORTABLE ABDOMEN - 1 VIEW COMPARISON:  Plain film of the abdomen dated 08/18/2021. CT abdomen dated 08/16/2021. FINDINGS: The enteric tube is no longer  visualized, presumably retracted to the level of the upper chest or beyond. Continued evidence of small-bowel obstruction with dilated gas-filled small bowel loops throughout the central abdomen. No evidence of free intraperitoneal air. IMPRESSION: 1. The enteric tube is no longer visualized, presumably retracted to the level of the upper chest or beyond the upper chest. Recommend repositioning into the stomach. 2. Continued evidence of small-bowel obstruction. Electronically Signed   By: Franki Cabot M.D.   On: 08/19/2021 13:08   ECHOCARDIOGRAM LIMITED  Result Date: 08/15/2021    ECHOCARDIOGRAM LIMITED REPORT   Patient Name:   Alyssa Carey Date of Exam: 08/14/2021 Medical Rec #:  [id number]       Height:       58.0 in Accession #:    [id number]      Weight:       187.4 lb Date of Birth:  09-14-1970       BSA:          1.771 m Patient Age:    70 years        BP:           132/91 mmHg Patient Gender: F               HR:           124 bpm. Exam Location:  Inpatient Procedure: Limited  Echo, Limited Color Doppler and Cardiac Doppler Indications:     abnormal ecg  History:         Patient has prior history of Echocardiogram examinations, most                  recent 04/22/2021. Risk Factors:Hypertension.  Sonographer:     Johny Chess RDCS Referring Phys:  [id number] [place] Diagnosing Phys: Gwyndolyn Kaufman MD IMPRESSIONS  1. Left ventricular ejection fraction, by estimation, is 50 to 55%. The left ventricle has low normal function. The left ventricle has no regional wall motion abnormalities.  2. The right ventricular size is normal. There is normal pulmonary artery systolic pressure. The estimated right ventricular systolic pressure is 66.5 mmHg.  3. The mitral valve is grossly normal. Trivial mitral valve regurgitation.  4. The aortic valve was not well visualized. Aortic valve regurgitation is not visualized. No aortic stenosis is present.  5. The inferior vena cava is normal in size with greater than 50% respiratory variability, suggesting right atrial pressure of 3 mmHg.  6. Tachycardiac throughout study. Comparison(s): Compared to prior TTE in 04/2021, the EF appears slightly lower at ~55% (previously 60-65%) but patient is notably more tachycardic during current study. FINDINGS  Left Ventricle: Left ventricular ejection fraction, by estimation, is 50 to 55%. The left ventricle has low normal function. The left ventricle has no regional wall motion abnormalities. The left ventricular internal cavity size was normal in size. There is no left ventricular hypertrophy. Right Ventricle: The right ventricular size is normal. There is normal pulmonary artery systolic pressure. The tricuspid regurgitant velocity is 2.59 m/s, and with an assumed right atrial pressure of 3 mmHg, the estimated right ventricular systolic pressure is 99.3 mmHg. Left Atrium: Left atrial size was normal in size. Right Atrium: Right atrial size was normal in size. Pericardium: There is no evidence of pericardial  effusion. Mitral Valve: The mitral valve is grossly normal. There is mild thickening of the mitral valve leaflet(s). There is mild calcification of the mitral valve leaflet(s). Trivial mitral valve regurgitation. Tricuspid Valve: The tricuspid valve is normal in structure.  Tricuspid valve regurgitation is mild. Aortic Valve: The aortic valve was not well visualized. Aortic valve regurgitation is not visualized. No aortic stenosis is present. Pulmonic Valve: The pulmonic valve was normal in structure. Pulmonic valve regurgitation is trivial. Venous: The inferior vena cava is normal in size with greater than 50% respiratory variability, suggesting right atrial pressure of 3 mmHg. IAS/Shunts: The atrial septum is grossly normal. LEFT VENTRICLE PLAX 2D LVIDd:         5.10 cm LVIDs:         3.60 cm  LV Volumes (MOD) LV vol d, MOD A4C: 81.9 ml LV vol s, MOD A4C: 45.7 ml LV SV MOD A4C:     81.9 ml AORTIC VALVE LVOT Vmax:   93.30 cm/s LVOT Vmean:  58.000 cm/s LVOT VTI:    0.110 m TRICUSPID VALVE TR Peak grad:   26.8 mmHg TR Vmax:        259.00 cm/s  SHUNTS Systemic VTI: 0.11 m Gwyndolyn Kaufman MD Electronically signed by Gwyndolyn Kaufman MD Signature Date/Time: 08/15/2021/10:07:59 AM    Final (Updated)    [place] EKG SITE RITE  Result Date: 08/17/2021 If Site Rite image not attached, placement could not be confirmed due to current cardiac rhythm.  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 Images obtained: 7, over 16 hours FINDINGS: Poor emptying of tracer from the stomach, potentially related to patient being supine for prolonged periods. Proximal small  bowel loops normal appearance. Dilated small bowel loops are seen in the hernia sac adjacent to the colostomy. However contrast passes beyond these into distal small bowel. Several of the small bowel loops immediately proximal to the hernia sac are significantly dilated however the course of the exam. However contrast passes beyond the parastomal herniation and into the RIGHT colon, which is decompressed. Findings are consistent with a partial small bowel obstruction at the level of the parastomal hernia. IMPRESSION: Partial small bowel obstruction at the level of the parastomal hernia, with several dilated loops proximal to the hernia as well as within the hernia sac, though contrast is able to pass beyond into nondistended distal small bowel loops and on to the RIGHT colon. Electronically Signed   By: Lavonia Dana M.D.   On: 08/23/2021 09:11    Orson Eva, DO  [facility]  If 7PM-7AM, please contact night-coverage [email or web address] Password [email or web address] 08/26/2021, 11:38 AM   LOS: 15 days

## 2021-08-26 NOTE — Assessment & Plan Note (Addendum)
Continuing to maintain sinus rhythm.  No further atrial flutter.  CHA2DS2-VASc is 2.  She is not an anticoagulation candidate, agree with prior cardiologist.  Continue with metoprolol 2.5 mg every 6 hours IV for medical management.  Most recent tachycardia on telemetry in the 124 range appears to be sinus tachycardia.

## 2021-08-26 NOTE — Progress Notes (Signed)
Progress Note  Patient Name: Alyssa Carey Date of Encounter: 08/26/2021  [facility] Cardiologist: None   Subjective   No current complaints.  No chest pain, no shortness of breath.  Inpatient Medications    Scheduled Meds:  chlorhexidine  15 mL Mouth Rinse BID   Chlorhexidine Gluconate Cloth  6 each Topical Daily   feeding supplement  1 Container Oral TID BM   heparin  5,000 Units Subcutaneous Q8H   mouth rinse  15 mL Mouth Rinse q12n4p   metoprolol tartrate  2.5 mg Intravenous Q6H   pantoprazole (PROTONIX) IV  40 mg Intravenous Q12H   psyllium  1 packet Oral Daily   sodium chloride flush  10-40 mL Intracatheter Q12H   Continuous Infusions:  methocarbamol (ROBAXIN) IV Stopped (08/18/21 0730)   TPN ADULT (ION) 80 mL/hr at 08/25/21 1807   TPN ADULT (ION)     PRN Meds: acetaminophen **OR** acetaminophen, albuterol, diphenhydrAMINE, loperamide, LORazepam, methocarbamol (ROBAXIN) IV, morphine injection, ondansetron **OR** ondansetron (ZOFRAN) IV, phenol, sodium chloride flush   Vital Signs    Vitals:   08/25/21 2030 08/25/21 2332 08/26/21 0000 08/26/21 0404  BP: 103/67  108/68   Pulse: (!) 104  97 90  Resp: (!) 21  (!) 31 20  Temp:  97.8 F (36.6 C)  98 F (36.7 C)  TempSrc:  Oral  Oral  SpO2: 98%  98% 99%  Weight:    75.7 kg  Height:        Intake/Output Summary (Last 24 hours) at 08/26/2021 0831 Last data filed at 08/26/2021 0630 Gross per 24 hour  Intake 1932.61 ml  Output 2875 ml  Net -942.39 ml   Last 3 Weights 08/26/2021 08/25/2021 08/24/2021  Weight (lbs) 166 lb 14.2 oz 167 lb 1.7 oz 166 lb 3.6 oz  Weight (kg) 75.7 kg 75.8 kg 75.4 kg      Telemetry    Sinus rhythm- Personally Reviewed  ECG    No new- Personally Reviewed  Physical Exam   GEN: Chronically ill-appearing Neck: No JVD, TPN Cardiac: RRR, no murmurs, rubs, or gallops.  Respiratory: Clear to auscultation bilaterally. GI: Soft, nontender, distended, post colectomy, quiet MS: Mild  generalized edema; No deformity. Neuro:  Nonfocal  Psych: Normal affect   Labs    High Sensitivity Troponin:  No results for input(s): TROPONINIHS in the last 720 hours.   Chemistry Recent Labs  Lab 08/22/21 0700 08/23/21 0817 08/24/21 0826 08/25/21 0609  NA 135 132* 135 132*  K 3.9 4.2 4.0 4.8  CL 99 95* 104 101  CO2 30 30 26 27   GLUCOSE 120* 134* 110* 124*  BUN 7 7 7 8   CREATININE 0.33* 0.30* <0.30* 0.33*  CALCIUM 8.1* 8.2* 8.1* 8.4*  MG 1.5* 1.6* 1.8 1.9  PROT 5.8*  --  6.1* 6.5  ALBUMIN 1.8*  --  2.0* 2.2*  AST 17  --  35 24  ALT 9  --  15 15  ALKPHOS 64  --  176* 129*  BILITOT 0.1*  --  0.5 0.5  GFRNONAA >60 >60 NOT CALCULATED >60  ANIONGAP 6 7 5  4*    Lipids  Recent Labs  Lab 08/22/21 0501  TRIG 462*    Hematology Recent Labs  Lab 08/20/21 0457 08/24/21 0404 08/25/21 0609  WBC 9.9 19.3* 10.9*  RBC 2.69* 2.77* 2.72*  HGB 8.3* 8.7* 8.6*  HCT 26.9* 26.0* 26.2*  MCV 100.0 93.9 96.3  MCH 30.9 31.4 31.6  MCHC 30.9 33.5 32.8  RDW 17.7* 17.2* 17.5*  PLT 515* 376 300   Thyroid No results for input(s): TSH, FREET4 in the last 168 hours.  BNPNo results for input(s): BNP, PROBNP in the last 168 hours.  DDimer No results for input(s): DDIMER in the last 168 hours.   Radiology    DG Chest 2 View  Result Date: 08/24/2021 CLINICAL DATA:  Fever. EXAM: CHEST - 2 VIEW COMPARISON:  August 17, 2021. FINDINGS: The heart size and mediastinal contours are within normal limits. Right-sided PICC line is unchanged in position. Mild bibasilar subsegmental atelectasis is noted. The visualized skeletal structures are unremarkable. IMPRESSION: Mild bibasilar subsegmental atelectasis. Electronically Signed   By: Marijo Conception M.D.   On: 08/24/2021 10:05   DG ABD ACUTE 2+V W 1V CHEST  Result Date: 08/26/2021 CLINICAL DATA:  Small bowel obstruction.  Emesis. EXAM: DG ABDOMEN ACUTE WITH 1 VIEW CHEST COMPARISON:  Chest and abdominal x-rays dated August 24, 2021. FINDINGS: Small  bowel dilatation has improved, although there is a single remaining dilated small bowel loop in the central abdomen measuring up to 4.3 cm in diameter. Oral contrast throughout the colon and in the left lower quadrant ostomy bag. 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 No pneumoperitoneum. No radiopaque calculi or other significant radiographic abnormality is seen. Unchanged right upper extremity PICC line. Stable cardiomediastinal silhouette with normal heart size. Continued low lung volumes with right greater than left basilar atelectasis. No focal consolidation, pleural effusion, or pneumothorax. IMPRESSION: 1. Improved partial small bowel obstruction. 2.  No active cardiopulmonary disease. Electronically Signed   By: Titus Dubin M.D.   On: 08/26/2021 07:55    Cardiac Studies    Telemetry shows normal sinus rhythm.  Occasional bouts of sinus tachycardia noted on telemetry 124 bpm. Echocardiogram shows EF of 55% with trivial MR.  Patient Profile     51 y.o. female chronically ill with necrotizing fasciitis diverting colostomy history of alcohol use with tachycardia/atrial flutter, hypertension  Assessment & Plan    Atrial flutter ([place]) Continuing to maintain sinus rhythm.  No further atrial flutter.  CHA2DS2-VASc is 2.  She is not an anticoagulation candidate, agree with prior cardiologist.  Continue with metoprolol 2.5 mg every 6 hours IV for medical management.  Most recent tachycardia on telemetry in the 124 range appears to be sinus tachycardia.  SBO (small bowel obstruction) [facility]) Surgical notes reviewed.  Post colectomy.  TPN.  Recent x-ray reviewed.      For questions or updates, please contact [place] Please consult [email or web address] for contact info under        Signed, Candee Furbish, MD  08/26/2021, 8:31 AM

## 2021-08-26 NOTE — Progress Notes (Signed)
PHARMACY - TOTAL PARENTERAL NUTRITION CONSULT NOTE   Indication: Small bowel obstruction  Patient Measurements: Height: 4' 10"  (147.3 cm) Weight: 75.7 kg (166 lb 14.2 oz) IBW/kg (Calculated) : 40.9 TPN AdjBW (KG): 49 Body mass index is 34.88 kg/m.  Assessment:  51 yo female with medical history significant for large parastomal hernia and recent colitis with high output from her ostomy now with concern for SBO. Repeat CT 01/31 verified partial bowel obstruction. Patient remains NPO. Pharmacy consulted to initiate TPN. Patient tolerating TPN. NG output going down  Glucose / Insulin:  107-112, 0 units insulin Electrolytes: Na 132 potassium 4.0 > 4.8 WNL for others Renal: Scr 0.56 Hepatic: WNL   Central access: PICC 2/1 TPN start date: 2/2  Nutritional Goals: Goal TPN rate is 80 mL/hr (provides 92 g of protein and 1667 kcals per day)  RD Assessment: Estimated Needs Total Energy Estimated Needs: 1500-1700 Total Protein Estimated Needs: 90-99 gr Total Fluid Estimated Needs: >1700 ml daily  Current Nutrition:  Patient started on Boost TID  Plan:  Continue TPN to 10m/hr at 1800 Electrolytes in TPN: Na 738m/L, K 4034mL, Ca 5mE74m, Mg 10mE23m and Phos 15mmo54m Cl:Ac 1:2 Add standard MVI, thiamine, folic acid, and trace elements to TPN  Stop sliding scale insulin and CBC- patient with no insulin use x 48 hours Monitor TPN labs on Mon/Thurs,   StevenMargot AblesmD Clinical Pharmacist 08/26/2021 8:29 AM

## 2021-08-26 NOTE — Assessment & Plan Note (Signed)
Surgical notes reviewed.  Post colectomy.  TPN.  Recent x-ray reviewed.

## 2021-08-26 NOTE — Progress Notes (Signed)
Nutrition Follow-up  DOCUMENTATION CODES:   Non-severe (moderate) malnutrition in context of acute illness/injury, Obesity unspecified  INTERVENTION:  Request sent to MD: check levels of -Vitamin D and Vitamin C   TPN per pharmacy  Osmolite 1.0 is an isotonic option if pt unable to advance oral intake (it would need to be ordered).  NUTRITION DIAGNOSIS:   Moderate Malnutrition related to acute illness (dehydration, necrotizing faciitis, diverting colostomy) as evidenced by energy intake < or equal to 50% for > or equal to 5 days, mild muscle depletion.  -addressed with TPN  GOAL:   Patient will meet greater than or equal to 90% of their needs  -progressing  MONITOR:   PO intake, Supplement acceptance, Labs, Weight trends, I & O's   ASSESSMENT:  Patient is a 51 yo hx of necrotizing fascitis s/p diverting colostomy. Presents with electrolyte disturbance, dehydration.  Patient at goal rate TPN  -80 ml/hr which is meeting 100% of estimated needs. Provides 1667 kcal, 92 gr protein daily. NGT discontinued and pt says she is feeling better.   Patient tolerating liquid diet and asking for regular foods. Recommend discontinue Ensure or Boost due to hypertonic formula.   2/10- pt diet clear liquids 2/7 advanced to full liquids 2/8 - intake 0% at this time. She is receiving TPN at goal which is meeting 100% of estimated needs. Output reviewed- 1.2 Liters stool x 24 hr (reduced compared to last review).  Medications and weights reviewed.   Intake/Output Summary (Last 24 hours) at 08/26/2021 1020 Last data filed at 08/26/2021 0630 Gross per 24 hour  Intake 1932.61 ml  Output 2875 ml  Net -942.39 ml      Labs: BMP Latest Ref Rng & Units 08/25/2021 08/24/2021 08/23/2021  Glucose 70 - 99 mg/dL 124(H) 110(H) 134(H)  BUN 6 - 20 mg/dL 8 7 7   Creatinine 0.44 - 1.00 mg/dL 0.33(L) <0.30(L) 0.30(L)  BUN/Creat Ratio 6 - 22 (calc) - - -  Sodium 135 - 145 mmol/L 132(L) 135 132(L)  Potassium  3.5 - 5.1 mmol/L 4.8 4.0 4.2  Chloride 98 - 111 mmol/L 101 104 95(L)  CO2 22 - 32 mmol/L 27 26 30   Calcium 8.9 - 10.3 mg/dL 8.4(L) 8.1(L) 8.2(L)    Diet Order:   Diet Order             Diet full liquid Room service appropriate? Yes; Fluid consistency: Thin  Diet effective now                   EDUCATION NEEDS:   Education needs have been addressed  Skin:  Skin Assessment: Reviewed RN Assessment  Last BM:  Output via colostomy- 1.2 liters x 24 hr (improved from 1.45 Liters)    Height:   Ht Readings from Last 1 Encounters:  08/10/21 4' 10"  (1.473 m)    Weight:   Wt Readings from Last 1 Encounters:  08/26/21 75.7 kg    Ideal Body Weight:  43.6 kg  BMI:  Body mass index is 34.88 kg/m.  Estimated Nutritional Needs:   Kcal:  1500-1700  Protein:  90-99 gr  Fluid:  >1700 ml daily   Colman Cater MS,RD,CSG,LDN Contact: Shea Evans

## 2021-08-26 NOTE — Progress Notes (Signed)
[facility] Progress Note     Subjective: AAS this Am with improvement of SB dilation. Still with some contrast in the bowel and slow transit.  No nausea, having ostomy output and wanting more to eat.   Objective: Vital signs in last 24 hours: Temp:  [97.8 F (36.6 C)-98.3 F (36.8 C)] 98.2 F (36.8 C) (02/10 1453) Pulse Rate:  [82-109] 94 (02/10 1453) Resp:  [15-33] 19 (02/10 1453) BP: (98-111)/(63-77) 98/63 (02/10 1453) SpO2:  [97 %-100 %] 98 % (02/10 1453) Weight:  [75.7 kg] 75.7 kg (02/10 0404) Last BM Date: 08/24/21  Intake/Output from previous day: 02/09 0701 - 02/10 0700 In: 3030.4 [P.O.:220; I.V.:2810.4] Out: 2875 [Urine:1675; Stool:1200] Intake/Output this shift: Total I/O In: 714 [I.V.:714] Out: 400 [Stool:400]  General appearance: alert and no distress GI: soft, parastomal hernia reducible  Lab Results:  Recent Labs    08/24/21 0404 08/25/21 0609  WBC 19.3* 10.9*  HGB 8.7* 8.6*  HCT 26.0* 26.2*  PLT 376 300   BMET Recent Labs    08/24/21 0826 08/25/21 0609  NA 135 132*  K 4.0 4.8  CL 104 101  CO2 26 27  GLUCOSE 110* 124*  BUN 7 8  CREATININE <0.30* 0.33*  CALCIUM 8.1* 8.4*   PT/INR No results for input(s): LABPROT, INR in the last 72 hours.  Studies/Results: DG ABD ACUTE 2+V W 1V CHEST  Result Date: 08/26/2021 CLINICAL DATA:  Small bowel obstruction.  Emesis. EXAM: DG ABDOMEN ACUTE WITH 1 VIEW CHEST COMPARISON:  Chest and abdominal x-rays dated August 24, 2021. FINDINGS: Small bowel dilatation has improved, although there is a single remaining dilated small bowel loop in the central abdomen measuring up to 4.3 cm in diameter. Oral contrast throughout the colon and in the left lower quadrant ostomy bag. 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 No pneumoperitoneum. No radiopaque calculi or other significant radiographic abnormality is seen. Unchanged right upper extremity PICC line. Stable cardiomediastinal  silhouette with normal heart size. Continued low lung volumes with right greater than left basilar atelectasis. No focal consolidation, pleural effusion, or pneumothorax. IMPRESSION: 1. Improved partial small bowel obstruction. 2.  No active cardiopulmonary disease. Electronically Signed   By: Titus Dubin M.D.   On: 08/26/2021 07:55    Anti-infectives: Anti-infectives (From admission, onward)    Start     Dose/Rate Route Frequency Ordered Stop   08/19/21 1800  metroNIDAZOLE (FLAGYL) IVPB 500 mg        500 mg 100 mL/hr over 60 Minutes Intravenous Every 8 hours 08/19/21 1117 08/19/21 1900   08/19/21 1700  ciprofloxacin (CIPRO) IVPB 400 mg        400 mg 200 mL/hr over 60 Minutes Intravenous Every 12 hours 08/19/21 1117 08/19/21 1755   08/10/21 1000  ciprofloxacin (CIPRO) IVPB 400 mg  Status:  Discontinued        400 mg 200 mL/hr over 60 Minutes Intravenous Every 12 hours 08/10/21 0824 08/19/21 1117   08/10/21 0915  metroNIDAZOLE (FLAGYL) IVPB 500 mg  Status:  Discontinued        500 mg 100 mL/hr over 60 Minutes Intravenous Every 8 hours 08/10/21 0824 08/19/21 1117       Assessment/Plan: Patient with improving pSBO. Continue to ambulate, sit up. Soft diet Metamucil to help with output being more formed and hopefully not just liquid but would avoid imodium right now give recent pSBO Updated patient  Dr. Arnoldo Morale on over the weekend.    LOS: 15 days  Virl Cagey 08/26/2021

## 2021-08-27 LAB — CBC
HCT: 29.9 % — ABNORMAL LOW (ref 36.0–46.0)
Hemoglobin: 9.3 g/dL — ABNORMAL LOW (ref 12.0–15.0)
MCH: 29.6 pg (ref 26.0–34.0)
MCHC: 31.1 g/dL (ref 30.0–36.0)
MCV: 95.2 fL (ref 80.0–100.0)
Platelets: 434 10*3/uL — ABNORMAL HIGH (ref 150–400)
RBC: 3.14 MIL/uL — ABNORMAL LOW (ref 3.87–5.11)
RDW: 17.2 % — ABNORMAL HIGH (ref 11.5–15.5)
WBC: 9.7 10*3/uL (ref 4.0–10.5)
nRBC: 0 % (ref 0.0–0.2)

## 2021-08-27 LAB — COMPREHENSIVE METABOLIC PANEL
ALT: 15 U/L (ref 0–44)
AST: 26 U/L (ref 15–41)
Albumin: 2.7 g/dL — ABNORMAL LOW (ref 3.5–5.0)
Alkaline Phosphatase: 120 U/L (ref 38–126)
Anion gap: 8 (ref 5–15)
BUN: 11 mg/dL (ref 6–20)
CO2: 26 mmol/L (ref 22–32)
Calcium: 8.9 mg/dL (ref 8.9–10.3)
Chloride: 98 mmol/L (ref 98–111)
Creatinine, Ser: 0.33 mg/dL — ABNORMAL LOW (ref 0.44–1.00)
GFR, Estimated: >60 mL/min (ref >60)
Glucose, Bld: 123 mg/dL — ABNORMAL HIGH (ref 70–99)
Potassium: 4.6 mmol/L (ref 3.5–5.1)
Sodium: 132 mmol/L — ABNORMAL LOW (ref 135–145)
Total Bilirubin: 0.4 mg/dL (ref 0.3–1.2)
Total Protein: 7.3 g/dL (ref 6.5–8.1)

## 2021-08-27 LAB — MAGNESIUM: Magnesium: 1.9 mg/dL (ref 1.7–2.4)

## 2021-08-27 LAB — PHOSPHORUS: Phosphorus: 4.7 mg/dL — ABNORMAL HIGH (ref 2.5–4.6)

## 2021-08-27 MED ORDER — TRAVASOL 10 % IV SOLN
INTRAVENOUS | Status: DC
  Filled 2021-08-27: qty 921.6

## 2021-08-27 MED ORDER — PSYLLIUM 95 % PO PACK: 1.0000 | PACK | Freq: Every day | ORAL | Status: DC

## 2021-08-27 MED ORDER — PANTOPRAZOLE SODIUM 40 MG PO TBEC: 40.0000 mg | DELAYED_RELEASE_TABLET | Freq: Every day | ORAL | 0 refills | Status: DC

## 2021-08-27 MED ORDER — PANTOPRAZOLE SODIUM 40 MG PO TBEC
40.0000 mg | DELAYED_RELEASE_TABLET | Freq: Every day | ORAL | Status: DC
  Administered 2021-08-28: 40 mg via ORAL
  Filled 2021-08-27: qty 1

## 2021-08-27 MED ORDER — SODIUM CHLORIDE 0.9 % IV BOLUS
1000.0000 mL | Freq: Once | INTRAVENOUS | Status: AC
  Administered 2021-08-27: 1000 mL via INTRAVENOUS

## 2021-08-27 MED ORDER — METOPROLOL TARTRATE 25 MG PO TABS: 25.0000 mg | ORAL_TABLET | Freq: Two times a day (BID) | ORAL | 1 refills | Status: DC

## 2021-08-27 MED ORDER — METOPROLOL TARTRATE 25 MG PO TABS
25.0000 mg | ORAL_TABLET | Freq: Two times a day (BID) | ORAL | Status: DC
  Administered 2021-08-27: 25 mg via ORAL
  Filled 2021-08-27: qty 1

## 2021-08-27 MED ORDER — METOPROLOL TARTRATE 25 MG PO TABS
37.5000 mg | ORAL_TABLET | Freq: Two times a day (BID) | ORAL | Status: DC
  Administered 2021-08-28: 37.5 mg via ORAL
  Filled 2021-08-27 (×2): qty 2

## 2021-08-27 NOTE — Progress Notes (Signed)
Call by RN around 1340 that HR into 130s after PICC line removed.   I went to evaluate patient.  She denied any f/c, cp, sob, palpitations, n/v.  CV--RRR, tachy Lung--CTA  HR 124 on exam; BP 98/67  EKG--personally reviewed--sinus tach 110, nonspecific T wave change  Hold discharge for now.  Metoprolol po given at 1300.  Increase metoprolol to 37.5 mg bid.  DTat

## 2021-08-27 NOTE — Progress Notes (Signed)
PHARMACY - TOTAL PARENTERAL NUTRITION CONSULT NOTE   Indication: Small bowel obstruction  Patient Measurements: Height: 4' 10"  (147.3 cm) Weight: 75.7 kg (166 lb 14.2 oz) IBW/kg (Calculated) : 40.9 TPN AdjBW (KG): 49 Body mass index is 34.88 kg/m.  Assessment:  51 yo female with medical history significant for large parastomal hernia and recent colitis with high output from her ostomy now with concern for SBO. Repeat CT 01/31 verified partial bowel obstruction. Patient remains NPO. Pharmacy consulted to initiate TPN. Patient tolerating TPN. NG output going down  Glucose / Insulin:  105-123, 0 units insulin Electrolytes: Na 132 potassium 4.8 > 4.6 Phos 3.9>4.7 Renal: Scr 0.56>0.33 Hepatic: WNL   Central access: PICC 2/1 TPN start date: 2/2  Nutritional Goals: Goal TPN rate is 80 mL/hr (provides 92 g of protein and 1667 kcals per day)  RD Assessment: Estimated Needs Total Energy Estimated Needs: 1500-1700 Total Protein Estimated Needs: 90-99 gr Total Fluid Estimated Needs: >1700 ml daily  Current Nutrition:  Patient started on Boost TID  Plan:  Continue TPN to 38m/hr at 1800 Electrolytes in TPN: Na increase to 87m/L, K 4064mL, Ca 5mE27m, Mg 10mE58m and Phos decrease to 5mmol88m Cl:Ac 1:2 Add standard MVI, thiamine, folic acid, and trace elements to TPN Monitor TPN labs on Mon/Thurs,   GarretThomasenia SalesmD, MBA ClWestern Washington Medical [facility]ba [facility] Centercal Pharmacist  08/27/2021 8:41 AM

## 2021-08-27 NOTE — Progress Notes (Signed)
°   08/27/21 1342  Assess: MEWS Score  BP 96/74  Pulse Rate (!) 126  Assess: MEWS Score  MEWS Temp 0  MEWS Systolic 1  MEWS Pulse 2  MEWS RR 0  MEWS LOC 0  MEWS Score 3  MEWS Score Color Yellow  Assess: if the MEWS score is Yellow or Red  Were vital signs taken at a resting state? Yes  Focused Assessment No change from prior assessment  Early Detection of Sepsis Score *See Row Information* Low  MEWS guidelines implemented *See Row Information* No, previously yellow, continue vital signs every 4 hours  Treat  MEWS Interventions Other (Comment) (MD made aware)  Pain Scale 0-10  Pain Score 0  Take Vital Signs  Increase Vital Sign Frequency  Yellow: Q 2hr X 2 then Q 4hr X 2, if remains yellow, continue Q 4hrs  Escalate  MEWS: Escalate Yellow: discuss with charge nurse/RN and consider discussing with provider and RRT  Notify: Charge Nurse/RN  Name of Charge Nurse/RN Notified Zenaida Deed RN  Date Charge Nurse/RN Notified 08/27/21  Time Charge Nurse/RN Notified 1342  Notify: Provider  Provider Name/Title DR Tat  Date Provider Notified 08/27/21  Time Provider Notified 1344  Notification Type Page  Notification Reason Other (Comment) (Heart Rate and Blood pressure)  Provider response See new orders  Date of Provider Response 08/27/21  Time of Provider Response 1345

## 2021-08-27 NOTE — Progress Notes (Signed)
°   08/26/21 1920 08/26/21 2206  Assess: MEWS Score  Temp  --  98 F (36.7 C)  BP  --  90/72  Pulse Rate  --  (!) 111  Resp  --  18  SpO2  --  98 %  O2 Device  --  Room Air  Assess: MEWS Score  MEWS Temp 0 0  MEWS Systolic 1 1  MEWS Pulse 0 2  MEWS RR 0 0  MEWS LOC 0 0  MEWS Score 1 3  MEWS Score Color Green Yellow  Treat  Patients response to intervention  --  Decreased  Take Vital Signs  Increase Vital Sign Frequency   --  Yellow: Q 2hr X 2 then Q 4hr X 2, if remains yellow, continue Q 4hrs (pt remains yellow)  Escalate  MEWS: Escalate  --  Yellow: discuss with charge nurse/RN and consider discussing with provider and RRT  Notify: Charge Nurse/RN  Name of Charge Nurse/RN Notified  --  jenny,rn  Date Charge Nurse/RN Notified  --  08/26/21  Time Charge Nurse/RN Notified  --  2206  Document  Patient Outcome  --  Other (Comment) (assessed)

## 2021-08-27 NOTE — Progress Notes (Addendum)
°  Subjective: Patient tolerating soft diet well.  Denies any nausea or vomiting.  Patient is emptying ostomy frequently.  Objective: Vital signs in last 24 hours: Temp:  [97.7 F (36.5 C)-98.6 F (37 C)] 97.7 F (36.5 C) (02/11 0553) Pulse Rate:  [82-112] 107 (02/11 0553) Resp:  [18-27] 18 (02/11 0553) BP: (90-110)/(63-86) 99/67 (02/11 0553) SpO2:  [98 %-99 %] 98 % (02/11 0553) Last BM Date: 08/27/21  Intake/Output from previous day: 02/10 0701 - 02/11 0700 In: 1274 [P.O.:480; I.V.:714] Out: 1050 [Stool:1050] Intake/Output this shift: Total I/O In: 240 [P.O.:240] Out: -   General appearance: alert, cooperative, and no distress GI: Soft, ostomy patent.  Stool and gas present.  Not particularly tender.  Lab Results:  Recent Labs    08/25/21 0609 08/27/21 0217  WBC 10.9* 9.7  HGB 8.6* 9.3*  HCT 26.2* 29.9*  PLT 300 434*   BMET Recent Labs    08/25/21 0609 08/27/21 0217  NA 132* 132*  K 4.8 4.6  CL 101 98  CO2 27 26  GLUCOSE 124* 123*  BUN 8 11  CREATININE 0.33* 0.33*  CALCIUM 8.4* 8.9   PT/INR No results for input(s): LABPROT, INR in the last 72 hours.  Studies/Results: DG ABD ACUTE 2+V W 1V CHEST  Result Date: 08/26/2021 CLINICAL DATA:  Small bowel obstruction.  Emesis. EXAM: DG ABDOMEN ACUTE WITH 1 VIEW CHEST COMPARISON:  Chest and abdominal x-rays dated August 24, 2021. FINDINGS: Small bowel dilatation has improved, although there is a single remaining dilated small bowel loop in the central abdomen measuring up to 4.3 cm in diameter. Oral contrast throughout the colon and in the left lower quadrant ostomy bag. 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 No pneumoperitoneum. No radiopaque calculi or other significant radiographic abnormality is seen. Unchanged right upper extremity PICC line. Stable cardiomediastinal silhouette with normal heart size. Continued low lung volumes with right greater than left basilar atelectasis. No focal  consolidation, pleural effusion, or pneumothorax. IMPRESSION: 1. Improved partial small bowel obstruction. 2.  No active cardiopulmonary disease. Electronically Signed   By: Titus Dubin M.D.   On: 08/26/2021 07:55    Anti-infectives: Anti-infectives (From admission, onward)    Start     Dose/Rate Route Frequency Ordered Stop   08/19/21 1800  metroNIDAZOLE (FLAGYL) IVPB 500 mg        500 mg 100 mL/hr over 60 Minutes Intravenous Every 8 hours 08/19/21 1117 08/19/21 1900   08/19/21 1700  ciprofloxacin (CIPRO) IVPB 400 mg        400 mg 200 mL/hr over 60 Minutes Intravenous Every 12 hours 08/19/21 1117 08/19/21 1755   08/10/21 1000  ciprofloxacin (CIPRO) IVPB 400 mg  Status:  Discontinued        400 mg 200 mL/hr over 60 Minutes Intravenous Every 12 hours 08/10/21 0824 08/19/21 1117   08/10/21 0915  metroNIDAZOLE (FLAGYL) IVPB 500 mg  Status:  Discontinued        500 mg 100 mL/hr over 60 Minutes Intravenous Every 8 hours 08/10/21 0824 08/19/21 1117       Assessment/Plan: Impression: Resolving partial small bowel obstruction.  Nothing further to add from the surgical standpoint.  No need for surgical intervention.  Continue current treatment.     LOS: 16 days    [facility] Signs 08/27/2021

## 2021-08-27 NOTE — Progress Notes (Signed)
°   08/26/21 1920  Assess: MEWS Score  Level of Consciousness Alert  Assess: MEWS Score  MEWS Temp 0  MEWS Systolic 1  MEWS Pulse 0  MEWS RR 0  MEWS LOC 0  MEWS Score 1  MEWS Score Color Green  Treat  Pain Scale 0-10  Pain Score 0  Neuro symptoms relieved by Rest  Nausea relieved by Nothing  Patients response to intervention Unchanged  Take Vital Signs  Increase Vital Sign Frequency  Yellow: Q 2hr X 2 then Q 4hr X 2, if remains yellow, continue Q 4hrs  Escalate  MEWS: Escalate Yellow: discuss with charge nurse/RN and consider discussing with provider and RRT  Notify: Charge Nurse/RN  Name of Charge Nurse/RN Notified [email or web address],rn  Date Charge Nurse/RN Notified 08/26/21  Time Charge Nurse/RN Notified 1920

## 2021-08-27 NOTE — Progress Notes (Signed)
Nsg Discharge Note  Admit Date:  08/09/2021 Discharge date: 08/27/2021   Robbie Louis to be D/C'd Home per MD order.  AVS completed.   Patient/caregiver able to verbalize understanding.  Discharge Medication: Allergies as of 08/27/2021       Reactions   Pantoprazole    Stomach pain   Penicillins Hives   Did it involve swelling of the face/tongue/throat, SOB, or low BP? N Did it involve sudden or severe rash/hives, skin peeling, or any reaction on the inside of your mouth or nose? Y Did you need to seek medical attention at a hospital or doctor's office? N When did it last happen?  Childhood     If all above answers are "NO", may proceed with cephalosporin use.   Oxycodone Hcl Rash        Medication List     STOP taking these medications    famotidine 20 MG tablet Commonly known as: PEPCID   folic acid 1 MG tablet Commonly known as: FOLVITE   furosemide 40 MG tablet Commonly known as: Lasix   hydrOXYzine 10 MG tablet Commonly known as: ATARAX       TAKE these medications    acetaminophen 325 MG tablet Commonly known as: TYLENOL Take 325 mg by mouth every 6 (six) hours as needed for mild pain.   BOOST PO Take 1 Bottle by mouth 4 (four) times daily.   metoprolol tartrate 25 MG tablet Commonly known as: LOPRESSOR Take 1 tablet (25 mg total) by mouth 2 (two) times daily.   pantoprazole 40 MG tablet Commonly known as: PROTONIX Take 1 tablet (40 mg total) by mouth daily. Start taking on: August 28, 2021   psyllium 95 % Pack Commonly known as: HYDROCIL/METAMUCIL Take 1 packet by mouth daily. Start taking on: August 28, 2021        Discharge Assessment: Vitals:   08/27/21 0229 08/27/21 0553  BP: 110/86 99/67  Pulse: (!) 112 (!) 107  Resp: 19 18  Temp: 98.6 F (37 C) 97.7 F (36.5 C)  SpO2: 99% 98%   Skin clean, dry and intact without evidence of skin break down, no evidence of skin tears noted. IV catheter discontinued intact. Site without  signs and symptoms of complications - no redness or edema noted at insertion site, patient denies c/o pain - only slight tenderness at site.  Dressing with slight pressure applied.  D/c Instructions-Education: Discharge instructions given to patient/family with verbalized understanding. D/c education completed with patient/family including follow up instructions, medication list, d/c activities limitations if indicated, with other d/c instructions as indicated by MD - patient able to verbalize understanding, all questions fully answered. Patient instructed to return to ED, call 911, or call MD for any changes in condition.  Patient escorted via [place], and D/C home via private auto.  Kathie Rhodes, RN 08/27/2021 12:39 PM

## 2021-08-27 NOTE — Discharge Summary (Signed)
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 *   Hospital Course: Alyssa Carey  is a 51 y.o. 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  Patient was admitted with pancolitis and associated dehydration as well as electrolyte abnormalities with hypokalemia and hypomagnesemia in the setting of ongoing high output/diarrhea.  She has been started on ciprofloxacin and Flagyl empirically on 1/25 and GI pathogen panel as well as C. difficile testing has been ordered which have now returned negative.  General surgery had initially evaluated patient on account of the parastomal hernia, but patient was improving and therefore they had signed off.  She now is having some persistent nausea and vomiting along with abdominal pain and repeat CT imaging on 1/31 demonstrates high-grade bowel obstruction findings for which general surgery has been reconsulted and has recommended patient to be n.p.o. and to place NG tube.  Pt began to improve clinically after NG placement with decreased ostomy output and improved abd pain.  NG was  subsequently removed 08/23/21 and pt started on clears Pt developed SVT evening of 08/23/21 and was moved to stepdown unit after adenosine 6 mg x2 and given 2 additional doses of IV lopressor.  Cardiology was consulted and felt patient had aflutter vs SVTand agreed with present management.  Echo (see below) and TSH were reassuring.  She not felt to be a good candidate for [facility] due to her anemia, colitis and FOBT+ Her activity was increased and her diet was gradually advanced with her NG removed.  She ultimately tolerated a soft diet. Case was discussed with surgery, Dr. Arnoldo Morale, on day of d/c and he cleared her for dc from the surgical standpoint.  Her IV lopressor was converted to po metoprolol and her HR overall improved  Assessment and Plan: Pancolitis -resolved clinically--pain improved; diarrhea/ostomy output improving -Last day of ciprofloxacin and Flagyl (patient completed 10 days of antibiotic therapy). -GI pathogen panel negative and C. difficile testing negative -No recent use of antibiotics prior to development   New findings of high-grade small bowel obstruction in the setting of parastomal hernia -08/15/21 CT abd--high grade SBO -2/7/23SBFT demonstrating contrast has moved through most of the small bowel and has been able to pass through hernia process.  -NG tube has been discontinued; clear liquid diet  started 2/7 -Patient advised to increase physical activity and to sit most of the time in the chair to further facilitate bowel peristalsis.  -Based on recent images findings patient now with contrast passing into distal SB and right colon -Continuing TPN for now per surgery -08/26/21 AAS--Small bowel dilatation has improved, although there is a  single remaining dilated small bowel loop in the central abdomen measuring up to 4.3 cm in diameter. Oral contrast throughout the colon and in the left lower quadrant ostomy bag -appreciate general surgery recommendations -increase activity>>PT  eval>>no followup -OOB and up in chair each shift -diet advanced slowly to soft diet which she tolerated 2/11--discussed with surgery, Dr. Iona Coach to dc from surgery standpoint -avoid imodium -use metamucil once daily after d/c   Dehydration secondary to above -high ostomy output at time of admission, now decreasing oral intake and NG tube usage until 08/23/2021 -NG tube has been removed; diet will be advanced as tolerated starting with clear liquids>>full liquids -Continue nutritional supplements -TPN per surgery   Narrow complex tachycardia/Paroxsymal SVT/Aflutter -HR had been in 110s due to acute medical illness and volume depletion (high ostomy output) -08/23/21--new SVT up to 160s -appreciate cardiology consult -08/11/21 TSH 0.632 -D-dimer noted to be 5.71>>>CT angiogram negative for PE -1/29 Echo 50-55%, no WMA, trivial MR, no AS -appreciate cardiology follow up -poor candidate for long term AC due to anemia, colitis, FOBT+ -personally reviewed EKG--sinus, TWI I,II, aVL, V4-V6   Moderate protein calorie malnutrition -NG tube has been removed 2/7; patient advanced to clear liquid diet>>full liquids>>soft diet on 08/26/21 -Follow clinical response and continue supportive care. -So far well-tolerated.   FOBT positive -Continue to monitor hemoglobin trend; no overt bleeding appreciated. -Continue treatment for PPI bid -Patient will benefit outpatient follow-up with gastroenterology service. -Hgb remains stable   History of PUD/alcoholic gastritis -Continue the use of PPI; still given IV due to ongoing n.p.o. status.   Alcoholic liver disease -Continue CIWA protocol -No active withdrawal symptoms appreciated currently. -Cessation counseling provided. -Continue to follow LFTs>>normalized   Leukocytosis/Fever -In the setting of pancolitis  -finished 10 days cipro/flagyl on 2/5 -fever 100.7 on evening 2/7 -check blood cultures--neg to date -UA--no  pyuria -PCT--10.54 -Lactate--1.5 -personally reviewed CXR--no infiltrates -overall resolved   Hypokalemia -In the setting of GI losses and NG tube -Continue to follow electrolytes and further replete as needed. -Patient also receiving TPN at this time. -corrected with supplementation and TPN   Hypomagnesemia -Magnesium 1.6>>1.9 -Continue repletion as needed and follow trend.   Grade 1 obesity BMI 34.88 Lifestyle modification  Personal history of protonix allergy -tolerated well during hospitalization            Consultants: general surgery Procedures performed: none   Disposition: Home Diet recommendation: Dys 3  DISCHARGE MEDICATION: Allergies as of 08/27/2021       Reactions   Pantoprazole    Stomach pain   Penicillins Hives   Did it involve swelling of the face/tongue/throat, SOB, or low BP? N Did it involve sudden or severe rash/hives, skin peeling, or any reaction on the inside of your mouth or nose? Y Did you need to seek medical attention at a hospital or doctor's office? N When did it last happen?  Childhood     If all above answers are "NO", may proceed with cephalosporin use.   Oxycodone Hcl Rash        Medication List     STOP taking these medications    famotidine 20 MG tablet Commonly known as: PEPCID   folic acid 1 MG tablet Commonly known as: FOLVITE   furosemide 40 MG tablet Commonly known as: Lasix   hydrOXYzine 10 MG tablet Commonly known as: ATARAX       TAKE these medications    acetaminophen 325 MG tablet Commonly known as: TYLENOL Take 325  mg by mouth every 6 (six) hours as needed for mild pain.   BOOST PO Take 1 Bottle by mouth 4 (four) times daily.   metoprolol tartrate 25 MG tablet Commonly known as: LOPRESSOR Take 1 tablet (25 mg total) by mouth 2 (two) times daily.   psyllium 95 % Pack Commonly known as: HYDROCIL/METAMUCIL Take 1 packet by mouth daily. Start taking on: August 28, 2021          Discharge Exam: Danley Danker Weights   08/24/21 0500 08/25/21 0530 08/26/21 0404  Weight: 75.4 kg 75.8 kg 75.7 kg   HEENT:  [place]/AT, No thrush, no icterus CV:  RRR, no rub, no S3, no S4 Lung:  CTA, no wheeze, no rhonchi Abd:  soft/+BS, tender around parastomal hernia Ext:  No edema, no lymphangitis, no synovitis, no rash   Condition at discharge: stable  The results of significant diagnostics from this hospitalization (including imaging, microbiology, ancillary and laboratory) are listed below for reference.   Imaging Studies: DG Chest 1 View  Result Date: 08/17/2021 CLINICAL DATA:  Nasogastric tube placement. EXAM: CHEST  1 VIEW COMPARISON:  04/21/2021.  Chest CTA dated 08/12/2021. FINDINGS: Very poor depth of inspiration with no gross change in mild elevation of the right hemidiaphragm. Mild bibasilar atelectasis. Interval small, oval nodular density in the lateral aspect of the mid right lung. No nodule at that location on the recent CTA. Normal sized heart. Unremarkable bones. IMPRESSION: 1. Very poor inspiration with mild bibasilar atelectasis. 2. Interval small nodular density in the lateral aspect of the mid right lung, not present on the CTA 5 days ago. This can be re-evaluated at the time of follow-up radiographs. Electronically Signed   By: Claudie Revering M.D.   On: 08/17/2021 10:51   DG Chest 2 View  Result Date: 08/24/2021 CLINICAL DATA:  Fever. EXAM: CHEST - 2 VIEW COMPARISON:  August 17, 2021. FINDINGS: The heart size and mediastinal contours are within normal limits. Right-sided PICC line is unchanged in position. Mild bibasilar subsegmental atelectasis is noted. The visualized skeletal structures are unremarkable. IMPRESSION: Mild bibasilar subsegmental atelectasis. Electronically Signed   By: Marijo Conception M.D.   On: 08/24/2021 10:05   DG Abd 1 View  Result Date: 08/24/2021 CLINICAL DATA:  Dehydration, vomiting, small-bowel obstruction, necrotizing fasciitis. Small bowel  fluoroscopy exam yesterday. 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 EXAM: ABDOMEN - 1 VIEW COMPARISON:  Small-bowel follow-through 08/22/2021 FINDINGS: Residual contrast material is demonstrated in dilated small bowel with decompressed terminal ileum and contrast seen in the cecum. This is consistent with partial small bowel obstruction. Left lower quadrant ostomy with peristomal hernia suggested. No significant change since prior study. IMPRESSION: Residual contrast material in the small bowel and colon. Changes are consistent with partial small bowel obstruction. Electronically Signed   By: Lucienne Capers M.D.   On: 08/24/2021 01:47   DG Abd 1 View  Result Date: 08/22/2021 CLINICAL DATA:  Small-bowel obstruction, history of colostomy EXAM: ABDOMEN - 1 VIEW COMPARISON:  Portable exam 0710 hours compared to 08/19/2021 FINDINGS: Persistent small bowel dilatation little changed. Paucity of colonic gas. No bowel wall thickening, or urinary tract calcification, or acute osseous findings. IMPRESSION: Persistent small bowel dilatation consistent with obstruction. Electronically Signed   By: Lavonia Dana M.D.   On: 08/22/2021 08:50   DG Abd 1 View  Result Date: 08/18/2021 CLINICAL DATA:  Large parastomal hernia.  Recurrent colitis. EXAM: ABDOMEN - 1 VIEW COMPARISON:  None. FINDINGS: Severe gaseous distension  of the stomach. Insert gastric tube with the tip projecting over the stomach. Gaseous distension of the small bowel. No bowel dilatation to suggest obstruction. No evidence of pneumoperitoneum, portal venous gas or pneumatosis. No pathologic calcifications along the expected course of the ureters. No acute osseous abnormality. IMPRESSION: 1. Severe gaseous distension of the stomach with a nasogastric tube in place in the stomach. Gaseous distension of the small bowel. Overall findings concerning for persistent small bowel obstruction. Electronically Signed   By: Kathreen Devoid M.D.   On: 08/18/2021  10:03   DG Abd 1 View  Result Date: 08/17/2021 CLINICAL DATA:  Encounter for NG tube placement. Pt admitted for SBO, c/o SOB. Hx of HTN, pt has colostomy. EXAM: ABDOMEN - 1 VIEW COMPARISON:  CT abdomen pelvis 08/16/2021 FINDINGS: Enteric tube coursing below the hemidiaphragm with tip and side port overlying the gastric lumen. The stomach is dilated with gas. No radio-opaque calculi or other significant radiographic abnormality are seen. IMPRESSION: 1. Enteric tube in good position. 2. Obstructive bowel gas pattern again noted. Electronically Signed   By: Iven Finn M.D.   On: 08/17/2021 23:52   CT Angio Chest Pulmonary Embolism (PE) W or WO Contrast  Result Date: 08/12/2021 CLINICAL DATA:  Tachycardia with elevated D-dimer levels. History of hypertension. Clinical concern for pulmonary embolism. EXAM: CT ANGIOGRAPHY CHEST WITH CONTRAST TECHNIQUE: Multidetector CT imaging of the chest was performed using the standard protocol during bolus administration of intravenous contrast. Multiplanar CT image reconstructions and MIPs were obtained to evaluate the vascular anatomy. RADIATION DOSE REDUCTION: This exam was performed according to the departmental dose-optimization program which includes automated exposure control, adjustment of the mA and/or kV according to patient size and/or use of iterative reconstruction technique. CONTRAST:  66m OMNIPAQUE IOHEXOL 350 MG/ML SOLN COMPARISON:  Chest CT 01/13/2020. FINDINGS: Cardiovascular: The pulmonary arteries are suboptimally opacified with contrast due to preferential opacification of the aorta and mild breathing artifact. There is no evidence of acute pulmonary embolism. No acute systemic arterial abnormalities are identified. The heart size is stable. There is no pericardial effusion. Mediastinum/Nodes: There are no enlarged mediastinal, hilar or axillary lymph nodes. The thyroid gland, trachea and esophagus demonstrate no significant findings. Lungs/Pleura:  The previously demonstrated bilateral pleural effusions have resolved, and there is improved aeration of both lungs. There is mild residual dependent atelectasis in both lungs. No airspace disease or suspicious pulmonary nodularity identified. Upper abdomen: The liver demonstrates diffusely decreased density consistent with steatosis. No focal abnormality identified. No acute findings are seen within the visualized upper abdomen. Musculoskeletal/Chest wall: There is no chest wall mass or suspicious osseous finding. Generalized soft tissue edema present previously has improved. Review of the MIP images confirms the above findings. IMPRESSION: 1. No evidence of acute pulmonary embolism or other acute findings within the chest. 2. Interval resolution of previously demonstrated pleural effusions with improved atelectasis in both lungs. 3. Hepatic steatosis. Electronically Signed   By: WRichardean SaleM.D.   On: 08/12/2021 14:23   CT ABDOMEN PELVIS W CONTRAST  Result Date: 08/16/2021 CLINICAL DATA:  Abdominal pain EXAM: CT ABDOMEN AND PELVIS WITH CONTRAST TECHNIQUE: Multidetector CT imaging of the abdomen and pelvis was performed using the standard protocol following bolus administration of intravenous contrast. RADIATION DOSE REDUCTION: This exam was performed according to the departmental dose-optimization program which includes automated exposure control, adjustment of the mA and/or kV according to patient size and/or use of iterative reconstruction technique. CONTRAST:  1038mOMNIPAQUE IOHEXOL 300 MG/ML  SOLN COMPARISON:  08/10/2021 FINDINGS: Lower chest: There are small patchy infiltrates in both lower lobes with interval worsening. Hepatobiliary: There is fatty infiltration in the liver. Gallbladder is unremarkable. Pancreas: There is pancreatic atrophy. No focal abnormality is seen. Spleen: Unremarkable. Adrenals/Urinary Tract: Adrenals are not enlarged. There is no hydronephrosis. There are no renal or  ureteral stones. Urinary bladder is not distended. Stomach/Bowel: There is marked distention of stomach. There is fluid in the lumen of visualized lower thoracic esophagus, possibly suggesting reflux. There is abnormal dilation of small-bowel loops. Small bowel loops measure up to 4.8 cm. There is a ventral hernia containing portions of small bowel loops and colon in the left lower quadrant. There is transition in the diameter of small-bowel loops within the hernia. There is mild diffuse wall thickening in the visualized portions of colon. There is liquid stool in the colon. There is decompression of sigmoid colon and rectum. Vascular/Lymphatic: Unremarkable. Reproductive: Other: There is no ascites or pneumoperitoneum. There is edema in subcutaneous plane in the abdominal wall, more so on the left side without loculated fluid collections. Musculoskeletal: Unremarkable. IMPRESSION: There is interval marked distention of stomach with fluid. There is abnormal dilation of proximal small bowel loops measuring up to 4.8 cm in diameter suggesting high-grade partial obstruction. Zone of transition in the small bowel loops appears to be within a ventral hernia in the left lower quadrant. Partly decompressed distal small bowel loops are seen within the hernial sac in the left lower quadrant. There is wall thickening along with fluid in the lumen in the ascending, transverse and descending colon suggesting colitis. Wall thickening in the colon appears less prominent. There is decompression of sigmoid colon. Findings suggest colitis and partial obstruction of left colon within the hernial sac in the left lower quadrant. There is no hydronephrosis. There are patchy infiltrates in both lower lung fields suggesting atelectasis/pneumonia. There is fluid in the lumen of thoracic esophagus suggesting gastroesophageal reflux. Fatty liver. There is edema in subcutaneous plane, possibly suggesting anasarca. There are no loculated fluid  collections in the abdominal wall. Other findings as described in the body of the report. Electronically Signed   By: Elmer Picker M.D.   On: 08/16/2021 12:45   CT ABDOMEN PELVIS W CONTRAST  Result Date: 08/10/2021 CLINICAL DATA:  Nausea vomiting. EXAM: CT ABDOMEN AND PELVIS WITH CONTRAST TECHNIQUE: Multidetector CT imaging of the abdomen and pelvis was performed using the standard protocol following bolus administration of intravenous contrast. RADIATION DOSE REDUCTION: This exam was performed according to the departmental dose-optimization program which includes automated exposure control, adjustment of the mA and/or kV according to patient size and/or use of iterative reconstruction technique. CONTRAST:  176m OMNIPAQUE IOHEXOL 300 MG/ML  SOLN COMPARISON:  CT abdomen pelvis dated 04/09/2021. FINDINGS: Lower chest: The visualized lung bases are clear. No intra-abdominal free air or free fluid. Hepatobiliary: Diffuse fatty liver. No intrahepatic biliary dilatation. Small gallstone. No pericholecystic fluid or evidence of acute cholecystitis by CT. Pancreas: Unremarkable. No pancreatic ductal dilatation or surrounding inflammatory changes. Spleen: Normal in size without focal abnormality. Adrenals/Urinary Tract: The adrenal glands unremarkable. There is no hydronephrosis on either side. There is symmetric enhancement and excretion of contrast by both kidneys. The visualized ureters and urinary bladder appear unremarkable. 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 There is diffuse inflammatory changes and thickening of the colon consistent with colitis. There is no bowel obstruction. The appendix is normal. Vascular/Lymphatic: The abdominal aorta and  IVC are unremarkable. No portal venous gas. Small scattered mesenteric lymph nodes. No adenopathy. Reproductive: The uterus is anteverted.  No adnexal masses. Other: Anterior pelvic wall incisional scar.  Musculoskeletal: No acute osseous pathology. IMPRESSION: 1. Pancolitis. No bowel obstruction. Normal appendix. 2. Fatty liver. 3. Cholelithiasis. Electronically Signed   By: Anner Crete M.D.   On: 08/10/2021 02:25   US Abdomen Limited  Result Date: 08/10/2021 CLINICAL DATA:  Evaluate for abscess EXAM: ULTRASOUND ABDOMEN LIMITED COMPARISON:  None. FINDINGS: Limited ultrasound of the area around the stoma demonstrates normal appearing bowel loops with no evidence of organized fluid collection. IMPRESSION: No evidence abscess. Electronically Signed   By: Yetta Glassman M.D.   On: 08/10/2021 14:34   US Venous Img Upper Uni Right(DVT)  Result Date: 08/17/2021 CLINICAL DATA:  51 year old female with elevated D-dimer, concern for right upper extremity deep vein thrombosis. EXAM: RIGHT UPPER EXTREMITY VENOUS DOPPLER ULTRASOUND TECHNIQUE: Gray-scale sonography with graded compression, as well as color Doppler and duplex ultrasound were performed to evaluate the upper extremity deep venous system from the level of the subclavian vein and including the jugular, axillary, basilic, radial, ulnar and upper cephalic vein. Spectral Doppler was utilized to evaluate flow at rest and with distal augmentation maneuvers. COMPARISON:  None. FINDINGS: Contralateral Subclavian Vein: Respiratory phasicity is normal and symmetric with the symptomatic side. No evidence of thrombus. Normal compressibility. Internal Jugular Vein: No evidence of thrombus. Normal compressibility, respiratory phasicity and response to augmentation. Subclavian Vein: No evidence of thrombus. Normal compressibility, respiratory phasicity and response to augmentation. Axillary Vein: No evidence of thrombus. Normal compressibility, respiratory phasicity and response to augmentation. Cephalic Vein: No evidence of thrombus. Normal compressibility, respiratory phasicity and response to augmentation. Basilic Vein: No evidence of thrombus. Normal  compressibility, respiratory phasicity and response to augmentation. Brachial Veins: No evidence of thrombus. Normal compressibility, respiratory phasicity and response to augmentation. Radial Veins: No evidence of thrombus. Normal compressibility, respiratory phasicity and response to augmentation. Ulnar Veins: No evidence of thrombus. Normal compressibility, respiratory phasicity and response to augmentation. Other Findings:  None visualized. IMPRESSION: No evidence of deep vein thrombosis within the right upper extremity. Ruthann Cancer, MD [facility] Electronically Signed   By: Ruthann Cancer M.D.   On: 08/17/2021 07:38   DG CHEST PORT 1 VIEW  Result Date: 08/17/2021 CLINICAL DATA:  NG tube placement. EXAM: PORTABLE CHEST 1 VIEW COMPARISON:  Chest x-ray 08/17/2021. FINDINGS: Enteric tube tip extends into the stomach, distal tip is not included, but is likely beyond the mid stomach. There is gastric dilatation as seen on prior CT. There is left basilar atelectasis. Right-sided central venous catheter tip projects over the distal SVC. Cardiomediastinal silhouette is unchanged, the heart is enlarged. There is no pneumothorax or acute fracture. IMPRESSION: 1. NG tube reaches the mid stomach. The distal tip is not included on this image. 2. Stable dilated stomach. Electronically Signed   By: Ronney Asters M.D.   On: 08/17/2021 23:44   DG ABD ACUTE 2+V W 1V CHEST  Result Date: 08/26/2021 CLINICAL DATA:  Small bowel obstruction.  Emesis. EXAM: DG ABDOMEN ACUTE WITH 1 VIEW CHEST COMPARISON:  Chest and abdominal x-rays dated August 24, 2021. FINDINGS: Small bowel dilatation has improved, although there is a single remaining dilated small bowel loop in the central abdomen measuring up to 4.3 cm in diameter. Oral contrast throughout the colon and in the left lower quadrant ostomy bag. Large left lower quadrant parastomal hernia again  noted. No  pneumoperitoneum. No radiopaque calculi or other significant radiographic abnormality is seen. Unchanged right upper extremity PICC line. Stable cardiomediastinal silhouette with normal heart size. Continued low lung volumes with right greater than left basilar atelectasis. No focal consolidation, pleural effusion, or pneumothorax. IMPRESSION: 1. Improved partial small bowel obstruction. 2.  No active cardiopulmonary disease. Electronically Signed   By: Titus Dubin M.D.   On: 08/26/2021 07:55   DG Abd Portable 1V  Result Date: 08/19/2021 CLINICAL DATA:  OG tube placement EXAM: PORTABLE ABDOMEN - 1 VIEW COMPARISON:  Plain film of the abdomen dated 08/18/2021. CT abdomen dated 08/16/2021. FINDINGS: The enteric tube is no longer visualized, presumably retracted to the level of the upper chest or beyond. Continued evidence of small-bowel obstruction with dilated gas-filled small bowel loops throughout the central abdomen. No evidence of free intraperitoneal air. IMPRESSION: 1. The enteric tube is no longer visualized, presumably retracted to the level of the upper chest or beyond the upper chest. Recommend repositioning into the stomach. 2. Continued evidence of small-bowel obstruction. Electronically Signed   By: Franki Cabot M.D.   On: 08/19/2021 13:08   ECHOCARDIOGRAM LIMITED  Result Date: 08/15/2021    ECHOCARDIOGRAM LIMITED REPORT   Patient Name:   Alyssa Carey Date of Exam: 08/14/2021 Medical Rec #:  [id number]       Height:       58.0 in Accession #:    [id number]      Weight:       187.4 lb Date of Birth:  1971/03/31       BSA:          1.771 m Patient Age:    80 years        BP:           132/91 mmHg Patient Gender: F               HR:           124 bpm. Exam Location:  Inpatient Procedure: Limited Echo, Limited Color Doppler and Cardiac Doppler Indications:     abnormal ecg  History:         Patient has prior history of Echocardiogram examinations, most                  recent 04/22/2021. Risk  Factors:Hypertension.  Sonographer:     Johny Chess RDCS Referring Phys:  [id number] [place] Diagnosing Phys: Gwyndolyn Kaufman MD IMPRESSIONS  1. Left ventricular ejection fraction, by estimation, is 50 to 55%. The left ventricle has low normal function. The left ventricle has no regional wall motion abnormalities.  2. The right ventricular size is normal. There is normal pulmonary artery systolic pressure. The estimated right ventricular systolic pressure is 30.1 mmHg.  3. The mitral valve is grossly normal. Trivial mitral valve regurgitation.  4. The aortic valve was not well visualized. Aortic valve regurgitation is not visualized. No aortic stenosis is present.  5. The inferior vena cava is normal in size with greater than 50% respiratory variability, suggesting right atrial pressure of 3 mmHg.  6. Tachycardiac throughout study. Comparison(s): Compared to prior TTE in 04/2021, the EF appears slightly lower at ~55% (previously 60-65%) but patient is notably more tachycardic during current study. FINDINGS  Left Ventricle: Left ventricular ejection fraction, by estimation, is 50 to 55%. The left ventricle has low normal function. The left ventricle has no regional wall motion abnormalities. The left ventricular internal cavity size was normal in  size. There is no left ventricular hypertrophy. Right Ventricle: The right ventricular size is normal. There is normal pulmonary artery systolic pressure. The tricuspid regurgitant velocity is 2.59 m/s, and with an assumed right atrial pressure of 3 mmHg, the estimated right ventricular systolic pressure is 65.4 mmHg. Left Atrium: Left atrial size was normal in size. Right Atrium: Right atrial size was normal in size. Pericardium: There is no evidence of pericardial effusion. Mitral Valve: The mitral valve is grossly normal. There is mild thickening of the mitral valve leaflet(s). There is mild calcification of the mitral valve leaflet(s). Trivial mitral valve  regurgitation. Tricuspid Valve: The tricuspid valve is normal in structure. Tricuspid valve regurgitation is mild. Aortic Valve: The aortic valve was not well visualized. Aortic valve regurgitation is not visualized. No aortic stenosis is present. Pulmonic Valve: The pulmonic valve was normal in structure. Pulmonic valve regurgitation is trivial. Venous: The inferior vena cava is normal in size with greater than 50% respiratory variability, suggesting right atrial pressure of 3 mmHg. IAS/Shunts: The atrial septum is grossly normal. LEFT VENTRICLE PLAX 2D LVIDd:         5.10 cm LVIDs:         3.60 cm  LV Volumes (MOD) LV vol d, MOD A4C: 81.9 ml LV vol s, MOD A4C: 45.7 ml LV SV MOD A4C:     81.9 ml AORTIC VALVE LVOT Vmax:   93.30 cm/s LVOT Vmean:  58.000 cm/s LVOT VTI:    0.110 m TRICUSPID VALVE TR Peak grad:   26.8 mmHg TR Vmax:        259.00 cm/s  SHUNTS Systemic VTI: 0.11 m Gwyndolyn Kaufman MD Electronically signed by Gwyndolyn Kaufman MD Signature Date/Time: 08/15/2021/10:07:59 AM    Final (Updated)    [place] EKG SITE RITE  Result Date: 08/17/2021 If Site Rite image not attached, placement could not be confirmed due to current cardiac rhythm.  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 Images obtained: 7, over 16 hours FINDINGS: Poor emptying of tracer from the stomach, potentially related to patient being supine for prolonged periods. Proximal small bowel loops normal appearance. Dilated small bowel loops are seen in the hernia sac adjacent to the colostomy. However contrast passes beyond these into distal small bowel. Several of the small  bowel loops immediately proximal to the hernia sac are significantly dilated however the course of the exam. However contrast passes beyond the parastomal herniation and into the RIGHT colon, which is decompressed. Findings are consistent with a partial small bowel obstruction at the level of the parastomal hernia. IMPRESSION: Partial small bowel obstruction at the level of the parastomal hernia, with several dilated loops proximal to the hernia as well as within the hernia sac, though contrast is able to pass beyond into nondistended distal small bowel loops and on to the RIGHT colon. Electronically Signed   By: Lavonia Dana M.D.   On: 08/23/2021 09:11    Microbiology: Results for orders placed or performed during the hospital encounter of 08/09/21  Resp Panel by RT-PCR (Flu A&B, Covid) Nasopharyngeal Swab     Status: None   Collection Time: 08/09/21 12:11 AM   Specimen: Nasopharyngeal Swab; Nasopharyngeal(NP)  swabs in vial transport medium  Result Value Ref Range Status   SARS Coronavirus 2 by RT PCR NEGATIVE NEGATIVE Final    Comment: (NOTE) SARS-CoV-2 target nucleic acids are NOT DETECTED.  The SARS-CoV-2 RNA is generally detectable in upper respiratory specimens during the acute phase of infection. The lowest concentration of SARS-CoV-2 viral copies this assay can detect is 138 copies/mL. A negative result does not preclude SARS-Cov-2 infection and should not be used as the sole basis for treatment or other patient management decisions. A negative result may occur with  improper specimen collection/handling, submission of specimen other than nasopharyngeal swab, presence of viral mutation(s) within the areas targeted by this assay, and inadequate number of viral copies(<138 copies/mL). A negative result must be combined with clinical observations, patient history, and epidemiological information. The expected result is Negative.  Fact Sheet for Patients:   EntrepreneurPulse.com.au  Fact Sheet for Healthcare Providers:  IncredibleEmployment.be  This test is no t yet approved or cleared by the [place] FDA and  has been authorized for detection and/or diagnosis of SARS-CoV-2 by FDA under an Emergency Use Authorization (EUA). This EUA will remain  in effect (meaning this test can be used) for the duration of the COVID-19 declaration under Section 564(b)(1) of the Act, 21 U.S.C.section 360bbb-3(b)(1), unless the authorization is terminated  or revoked sooner.       Influenza A by PCR NEGATIVE NEGATIVE Final   Influenza B by PCR NEGATIVE NEGATIVE Final    Comment: (NOTE) The Xpert Xpress SARS-CoV-2/FLU/RSV plus assay is intended as an aid in the diagnosis of influenza from Nasopharyngeal swab specimens and should not be used as a sole basis for treatment. Nasal washings and aspirates are unacceptable for Xpert Xpress SARS-CoV-2/FLU/RSV testing.  Fact Sheet for Patients: EntrepreneurPulse.com.au  Fact Sheet for Healthcare Providers: IncredibleEmployment.be  This test is not yet approved or cleared by the [place] FDA and has been authorized for detection and/or diagnosis of SARS-CoV-2 by FDA under an Emergency Use Authorization (EUA). This EUA will remain in effect (meaning this test can be used) for the duration of the COVID-19 declaration under Section 564(b)(1) of the Act, 21 U.S.C. section 360bbb-3(b)(1), unless the authorization is terminated or revoked.  Performed at [facility], [address] [place], [place] [address]   MRSA Next Gen by PCR, Nasal     Status: None   Collection Time: 08/10/21  6:46 AM   Specimen: Nasal Mucosa; Nasal Swab  Result Value Ref Range Status   MRSA by PCR Next Gen NOT DETECTED NOT DETECTED Final    Comment: (NOTE) The GeneXpert MRSA Assay (FDA approved for NASAL specimens only), is one component of a  comprehensive MRSA colonization surveillance program. It is not intended to diagnose MRSA infection nor to guide or monitor treatment for MRSA infections. Test performance is not FDA approved in patients less than 71 years old. Performed at [facility], [address] [place], [place] [address]   C Difficile Quick Screen w PCR reflex     Status: None   Collection Time: 08/10/21  8:22 AM   Specimen: STOOL  Result Value Ref Range Status   C Diff antigen NEGATIVE NEGATIVE Final   C Diff toxin NEGATIVE NEGATIVE Final   C Diff interpretation No C. difficile detected.  Final    Comment: Performed at [facility], [address], [place], [place] [address]  Gastrointestinal Panel by PCR , Stool     Status: None   Collection Time: 08/10/21  8:22 AM   Specimen: STOOL  Result Value Ref Range Status   Campylobacter species NOT DETECTED NOT DETECTED Final   Plesimonas shigelloides NOT DETECTED NOT DETECTED Final   Salmonella species NOT DETECTED NOT DETECTED Final   Yersinia enterocolitica NOT DETECTED NOT DETECTED Final   Vibrio species NOT DETECTED NOT DETECTED Final   Vibrio cholerae NOT DETECTED NOT DETECTED Final   Enteroaggregative E coli (EAEC) NOT DETECTED NOT DETECTED Final   Enteropathogenic E coli (EPEC) NOT DETECTED NOT DETECTED Final   Enterotoxigenic E coli (ETEC) NOT DETECTED NOT DETECTED Final   Shiga like toxin producing E coli (STEC) NOT DETECTED NOT DETECTED Final   Shigella/Enteroinvasive E coli (EIEC) NOT DETECTED NOT DETECTED Final   Cryptosporidium NOT DETECTED NOT DETECTED Final   Cyclospora cayetanensis NOT DETECTED NOT DETECTED Final   Entamoeba histolytica NOT DETECTED NOT DETECTED Final   Giardia lamblia NOT DETECTED NOT DETECTED Final   Adenovirus F40/41 NOT DETECTED NOT DETECTED Final   Astrovirus NOT DETECTED NOT DETECTED Final   Norovirus GI/GII NOT DETECTED NOT DETECTED Final   Rotavirus A NOT DETECTED NOT DETECTED Final   Sapovirus (I, II, IV, and V) NOT  DETECTED NOT DETECTED Final    Comment: Performed at [facility], [address] [place], [place] [address]  Culture, blood (routine x 2)     Status: None   Collection Time: 08/10/21  2:32 PM   Specimen: Left Antecubital; Blood  Result Value Ref Range Status   Specimen Description LEFT ANTECUBITAL  Final   Special Requests   Final    BOTTLES DRAWN AEROBIC AND ANAEROBIC Blood Culture adequate volume   Culture   Final    NO GROWTH 5 DAYS Performed at [facility], [address] [place], [place] [address]    Report Status 08/15/2021 FINAL  Final  Culture, blood (routine x 2)     Status: None   Collection Time: 08/10/21  2:32 PM   Specimen: BLOOD LEFT HAND  Result Value Ref Range Status   Specimen Description BLOOD LEFT HAND  Final   Special Requests   Final    BOTTLES DRAWN AEROBIC AND ANAEROBIC Blood Culture results may not be optimal due to an excessive volume of blood received in culture bottles   Culture   Final    NO GROWTH 5 DAYS Performed at [facility], [address] [place], [place] [address]    Report Status 08/15/2021 FINAL  Final  Culture, blood (routine x 2)     Status: None (Preliminary result)   Collection Time: 08/24/21  8:26 AM   Specimen: BLOOD LEFT HAND  Result Value Ref Range Status   Specimen Description BLOOD LEFT HAND  Final   Special Requests   Final    BOTTLES DRAWN AEROBIC AND ANAEROBIC Blood Culture results may not be optimal due to an excessive volume of blood received in culture bottles   Culture   Final    NO GROWTH 3 DAYS Performed at [facility], [address] [place], [place] [address]    Report Status PENDING  Incomplete  Culture, blood (routine x 2)     Status: None (Preliminary result)   Collection Time: 08/24/21  8:26 AM   Specimen: BLOOD LEFT ARM  Result Value Ref Range Status   Specimen Description BLOOD LEFT ARM  Final   Special Requests   Final    BOTTLES DRAWN AEROBIC AND ANAEROBIC Blood Culture results may not be  optimal due to an  excessive volume of blood received in culture bottles   Culture   Final    NO GROWTH 3 DAYS Performed at [facility], [address] [place], [place] [address]    Report Status PENDING  Incomplete  Urine Culture     Status: Abnormal   Collection Time: 08/24/21  4:20 PM   Specimen: Urine, Clean Catch  Result Value Ref Range Status   Specimen Description   Final    URINE, CLEAN CATCH Performed at [facility], [address], [place], [place] [address]    Special Requests   Final    NONE Performed at [facility], [address], [place], [place] [address]    Culture (A)  Final    50,000 COLONIES/mL DIPHTHEROIDS(CORYNEBACTERIUM SPECIES) Standardized susceptibility testing for this organism is not available. Performed at [facility], [place] [address] [place], [place] [address]    Report Status 08/26/2021 FINAL  Final    Labs: CBC: Recent Labs  Lab 08/24/21 0404 08/25/21 0609 08/27/21 0217  WBC 19.3* 10.9* 9.7  HGB 8.7* 8.6* 9.3*  HCT 26.0* 26.2* 29.9*  MCV 93.9 96.3 95.2  PLT 376 300 680*   Basic Metabolic Panel: Recent Labs  Lab 08/22/21 0700 08/23/21 0817 08/24/21 0826 08/25/21 0609 08/27/21 0217  NA 135 132* 135 132* 132*  K 3.9 4.2 4.0 4.8 4.6  CL 99 95* 104 101 98  CO2 30 30 26 27 26   GLUCOSE 120* 134* 110* 124* 123*  BUN 7 7 7 8 11   CREATININE 0.33* 0.30* <0.30* 0.33* 0.33*  CALCIUM 8.1* 8.2* 8.1* 8.4* 8.9  MG 1.5* 1.6* 1.8 1.9 1.9  PHOS 3.1  --   --  3.9 4.7*   Liver Function Tests: Recent Labs  Lab 08/22/21 0700 08/24/21 0826 08/25/21 0609 08/27/21 0217  AST 17 35 24 26  ALT 9 15 15 15   ALKPHOS 64 176* 129* 120  BILITOT 0.1* 0.5 0.5 0.4  PROT 5.8* 6.1* 6.5 7.3  ALBUMIN 1.8* 2.0* 2.2* 2.7*   CBG: Recent Labs  Lab 08/24/21 1957 08/25/21 0550 08/25/21 1416 08/25/21 2117 08/26/21 0619  GLUCAP 107* 119* 110* 113* 105*    Discharge time spent: greater than 30 minutes.  Signed: Orson Eva, MD [facility] 08/27/2021

## 2021-08-28 MED ORDER — METOPROLOL TARTRATE 37.5 MG PO TABS: 37.5000 mg | ORAL_TABLET | Freq: Two times a day (BID) | ORAL | 1 refills | Status: DC

## 2021-08-28 NOTE — Discharge Summary (Signed)
Physician Discharge Summary   Patient: Alyssa Carey MRN: [id number] DOB: [birth year]  Admit date:     08/09/2021  Discharge date: 08/28/21  Discharge Physician: Shanon Brow Deah Ottaway   PCP: Caryl Bis, MD   Recommendations at discharge:   Please follow up with primary care provider within 1-2 weeks  Please repeat BMP and CBC in one week  Please follow up on/with 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 *   Hospital Course: Alyssa Carey  is a 51 y.o. 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  Patient was admitted with pancolitis and associated dehydration as well as electrolyte abnormalities with hypokalemia and hypomagnesemia in the setting of ongoing high output/diarrhea.  She has been started on ciprofloxacin and Flagyl empirically on 1/25 and GI pathogen panel as well as C. difficile testing has been ordered which have now returned negative.  General surgery had initially evaluated patient on account of the parastomal hernia, but patient was improving and therefore they had signed off.  She now is having some persistent nausea and vomiting along with abdominal pain and repeat CT imaging on 1/31 demonstrates high-grade bowel obstruction findings for which general surgery has been reconsulted and has recommended patient to be n.p.o. and to place NG tube.  Pt began to improve clinically after NG placement with decreased ostomy output and improved abd pain.  NG was  subsequently removed 08/23/21 and pt started on clears Pt developed SVT evening of 08/23/21 and was moved to stepdown unit after adenosine 6 mg x2 and given 2 additional doses of IV lopressor.  Cardiology was consulted and felt patient had aflutter vs SVTand agreed with present management.  Echo (see below) and TSH were reassuring.  She not felt to be a good candidate for [facility] due to her anemia, colitis and FOBT+ Her activity was increased and her diet was gradually advanced with her NG removed.  She ultimately tolerated a soft diet. Case was discussed with surgery, Dr. Arnoldo Morale, on day of d/c and he cleared her for dc from the surgical standpoint.  Her IV lopressor was converted to po metoprolol and her HR overall improved Her DC was delayed one day until 08/28/21 due to aflutter/SVT on 08/1121 into 130s.  Her metoprolol was increased to 37.5 mg bid with improvement.    Assessment and Plan: Pancolitis -resolved clinically--pain improved; diarrhea/ostomy output improving -Last day of ciprofloxacin and Flagyl (patient completed 10 days of antibiotic therapy). -GI pathogen panel negative and C. difficile testing negative -No recent use of antibiotics prior to development   New findings of high-grade small bowel obstruction in the setting of parastomal hernia -08/15/21 CT abd--high grade SBO -2/7/23SBFT demonstrating contrast has moved through most of the small bowel and has been able to pass through hernia process.  -NG tube has been discontinued; clear liquid diet  started 2/7 -Patient advised to increase physical activity and to sit most of the time in the chair to further facilitate bowel peristalsis.  -Based on recent images findings  patient now with contrast passing into distal SB and right colon -Continuing TPN for now per surgery -08/26/21 AAS--Small bowel dilatation has improved, although there is a single remaining dilated small bowel loop in the central abdomen measuring up to 4.3 cm in diameter.  Oral contrast throughout the colon and in the left lower quadrant ostomy bag -appreciate general surgery recommendations -increase activity>>PT eval>>no followup -OOB and up in chair each shift -diet advanced slowly to soft diet which she tolerated 2/11--discussed with surgery, Dr. Iona Coach to dc from surgery standpoint -avoid imodium -use metamucil once daily after d/c   Dehydration secondary to above -high ostomy output at time of admission, now decreasing oral intake and NG tube usage until 08/23/2021 -NG tube has been removed; diet will be advanced as tolerated starting with clear liquids>>full liquids -Continue nutritional supplements -TPN per surgery   Narrow complex tachycardia/Paroxsymal SVT/Aflutter -HR had been in 110s due to acute medical illness and volume depletion (high ostomy output) -08/23/21--new SVT up to 160s -appreciate cardiology consult -08/11/21 TSH 0.632 -D-dimer noted to be 5.71>>>CT angiogram negative for PE -1/29 Echo 50-55%, no WMA, trivial MR, no AS -appreciate cardiology follow up -poor candidate for long term AC due to anemia, colitis, FOBT+ -personally reviewed EKG--sinus, TWI I,II, aVL, V4-V6 -metoprolol increased to 37.5 mg bid with improvement   Moderate protein calorie malnutrition -NG tube has been removed 2/7; patient advanced to clear liquid diet>>full liquids>>soft diet on 08/26/21 -Follow clinical response and continue supportive care. -So far well-tolerated.   FOBT positive -Continue to monitor hemoglobin trend; no overt bleeding appreciated. -Continue treatment for PPI bid -Patient will benefit outpatient follow-up with gastroenterology service. -Hgb remains stable   History of PUD/alcoholic gastritis -Continue the use of PPI; still given IV due to ongoing n.p.o. status.   Alcoholic liver disease -Continue CIWA protocol -No active withdrawal symptoms appreciated currently. -Cessation counseling provided. -Continue to follow  LFTs>>normalized   Leukocytosis/Fever -In the setting of pancolitis  -finished 10 days cipro/flagyl on 2/5 -fever 100.7 on evening 2/7 -check blood cultures--neg to date -UA--no pyuria -PCT--10.54 -Lactate--1.5 -personally reviewed CXR--no infiltrates -overall resolved   Hypokalemia -In the setting of GI losses and NG tube -Continue to follow electrolytes and further replete as needed. -Patient also receiving TPN at this time. -corrected with supplementation and TPN   Hypomagnesemia -Magnesium 1.6>>1.9 -Continue repletion as needed and follow trend.   Grade 1 obesity BMI 34.88 Lifestyle modification   Personal history of protonix allergy -tolerated well during hospitalization        Consultants: general surgery Procedures performed: none  Disposition: Home Diet recommendation:  Dysphagia type 3 thin Liquid  DISCHARGE MEDICATION: Allergies as of 08/28/2021       Reactions   Pantoprazole    Stomach pain   Penicillins Hives   Did it involve swelling of the face/tongue/throat, SOB, or low BP? N Did it involve sudden or severe rash/hives, skin peeling, or any reaction on the inside of your mouth or nose? Y Did you need to seek medical attention at a hospital or doctor's office? N When did it last happen?  Childhood     If all above answers are "NO", may proceed with cephalosporin use.   Oxycodone Hcl Rash        Medication List     STOP taking these medications    famotidine 20 MG tablet Commonly known as: PEPCID   folic acid 1 MG tablet Commonly known as: FOLVITE   furosemide 40 MG tablet Commonly  known as: Lasix   hydrOXYzine 10 MG tablet Commonly known as: ATARAX       TAKE these medications    acetaminophen 325 MG tablet Commonly known as: TYLENOL Take 325 mg by mouth every 6 (six) hours as needed for mild pain.   BOOST PO Take 1 Bottle by mouth 4 (four) times daily.   Metoprolol Tartrate 37.5 MG Tabs Take 37.5 mg by mouth 2 (two)  times daily.   pantoprazole 40 MG tablet Commonly known as: PROTONIX Take 1 tablet (40 mg total) by mouth daily.   psyllium 95 % Pack Commonly known as: HYDROCIL/METAMUCIL Take 1 packet by mouth daily.         Discharge Exam: Filed Weights   08/24/21 0500 08/25/21 0530 08/26/21 0404  Weight: 75.4 kg 75.8 kg 75.7 kg   HEENT:  [place]/AT, No thrush, no icterus CV:  RRR, no rub, no S3, no S4 Lung:  CTA, no wheeze, no rhonchi Abd:  soft/+BS, NT Ext:  No edema, no lymphangitis, no synovitis, no rash   Condition at discharge: stable  The results of significant diagnostics from this hospitalization (including imaging, microbiology, ancillary and laboratory) are listed below for reference.   Imaging Studies: DG Chest 1 View  Result Date: 08/17/2021 CLINICAL DATA:  Nasogastric tube placement. EXAM: CHEST  1 VIEW COMPARISON:  04/21/2021.  Chest CTA dated 08/12/2021. FINDINGS: Very poor depth of inspiration with no gross change in mild elevation of the right hemidiaphragm. Mild bibasilar atelectasis. Interval small, oval nodular density in the lateral aspect of the mid right lung. No nodule at that location on the recent CTA. Normal sized heart. Unremarkable bones. IMPRESSION: 1. Very poor inspiration with mild bibasilar atelectasis. 2. Interval small nodular density in the lateral aspect of the mid right lung, not present on the CTA 5 days ago. This can be re-evaluated at the time of follow-up radiographs. Electronically Signed   By: Claudie Revering M.D.   On: 08/17/2021 10:51   DG Chest 2 View  Result Date: 08/24/2021 CLINICAL DATA:  Fever. EXAM: CHEST - 2 VIEW COMPARISON:  August 17, 2021. FINDINGS: The heart size and mediastinal contours are within normal limits. Right-sided PICC line is unchanged in position. Mild bibasilar subsegmental atelectasis is noted. The visualized skeletal structures are unremarkable. IMPRESSION: Mild bibasilar subsegmental atelectasis. Electronically Signed   By:  Marijo Conception M.D.   On: 08/24/2021 10:05   DG Abd 1 View  Result Date: 08/24/2021 CLINICAL DATA:  Dehydration, vomiting, small-bowel obstruction, necrotizing fasciitis. Small bowel fluoroscopy exam yesterday. 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 EXAM: ABDOMEN - 1 VIEW COMPARISON:  Small-bowel follow-through 08/22/2021 FINDINGS: Residual contrast material is demonstrated in dilated small bowel with decompressed terminal ileum and contrast seen in the cecum. This is consistent with partial small bowel obstruction. Left lower quadrant ostomy with peristomal hernia suggested. No significant change since prior study. IMPRESSION: Residual contrast material in the small bowel and colon. Changes are consistent with partial small bowel obstruction. Electronically Signed   By: Lucienne Capers M.D.   On: 08/24/2021 01:47   DG Abd 1 View  Result Date: 08/22/2021 CLINICAL DATA:  Small-bowel obstruction, history of colostomy EXAM: ABDOMEN - 1 VIEW COMPARISON:  Portable exam 0710 hours compared to 08/19/2021 FINDINGS: Persistent small bowel dilatation little changed. Paucity of colonic gas. No bowel wall thickening, or urinary tract calcification, or acute osseous findings. IMPRESSION: Persistent small bowel dilatation consistent with obstruction. Electronically Signed   By: Elta Gu[place]oupe  Thornton Papas M.D.   On: 08/22/2021 08:50   DG Abd 1 View  Result Date: 08/18/2021 CLINICAL DATA:  Large parastomal hernia.  Recurrent colitis. EXAM: ABDOMEN - 1 VIEW COMPARISON:  None. FINDINGS: Severe gaseous distension of the stomach. Insert gastric tube with the tip projecting over the stomach. Gaseous distension of the small bowel. No bowel dilatation to suggest obstruction. No evidence of pneumoperitoneum, portal venous gas or pneumatosis. No pathologic calcifications along the expected course of the ureters. No acute osseous abnormality. IMPRESSION: 1. Severe gaseous distension of the stomach with a nasogastric tube in place in  the stomach. Gaseous distension of the small bowel. Overall findings concerning for persistent small bowel obstruction. Electronically Signed   By: Kathreen Devoid M.D.   On: 08/18/2021 10:03   DG Abd 1 View  Result Date: 08/17/2021 CLINICAL DATA:  Encounter for NG tube placement. Pt admitted for SBO, c/o SOB. Hx of HTN, pt has colostomy. EXAM: ABDOMEN - 1 VIEW COMPARISON:  CT abdomen pelvis 08/16/2021 FINDINGS: Enteric tube coursing below the hemidiaphragm with tip and side port overlying the gastric lumen. The stomach is dilated with gas. No radio-opaque calculi or other significant radiographic abnormality are seen. IMPRESSION: 1. Enteric tube in good position. 2. Obstructive bowel gas pattern again noted. Electronically Signed   By: Iven Finn M.D.   On: 08/17/2021 23:52   CT Angio Chest Pulmonary Embolism (PE) W or WO Contrast  Result Date: 08/12/2021 CLINICAL DATA:  Tachycardia with elevated D-dimer levels. History of hypertension. Clinical concern for pulmonary embolism. EXAM: CT ANGIOGRAPHY CHEST WITH CONTRAST TECHNIQUE: Multidetector CT imaging of the chest was performed using the standard protocol during bolus administration of intravenous contrast. Multiplanar CT image reconstructions and MIPs were obtained to evaluate the vascular anatomy. RADIATION DOSE REDUCTION: This exam was performed according to the departmental dose-optimization program which includes automated exposure control, adjustment of the mA and/or kV according to patient size and/or use of iterative reconstruction technique. CONTRAST:  38m OMNIPAQUE IOHEXOL 350 MG/ML SOLN COMPARISON:  Chest CT 01/13/2020. FINDINGS: Cardiovascular: The pulmonary arteries are suboptimally opacified with contrast due to preferential opacification of the aorta and mild breathing artifact. There is no evidence of acute pulmonary embolism. No acute systemic arterial abnormalities are identified. The heart size is stable. There is no pericardial  effusion. Mediastinum/Nodes: There are no enlarged mediastinal, hilar or axillary lymph nodes. The thyroid gland, trachea and esophagus demonstrate no significant findings. Lungs/Pleura: The previously demonstrated bilateral pleural effusions have resolved, and there is improved aeration of both lungs. There is mild residual dependent atelectasis in both lungs. No airspace disease or suspicious pulmonary nodularity identified. Upper abdomen: The liver demonstrates diffusely decreased density consistent with steatosis. No focal abnormality identified. No acute findings are seen within the visualized upper abdomen. Musculoskeletal/Chest wall: There is no chest wall mass or suspicious osseous finding. Generalized soft tissue edema present previously has improved. Review of the MIP images confirms the above findings. IMPRESSION: 1. No evidence of acute pulmonary embolism or other acute findings within the chest. 2. Interval resolution of previously demonstrated pleural effusions with improved atelectasis in both lungs. 3. Hepatic steatosis. Electronically Signed   By: WRichardean SaleM.D.   On: 08/12/2021 14:23   CT ABDOMEN PELVIS W CONTRAST  Result Date: 08/16/2021 CLINICAL DATA:  Abdominal pain EXAM: CT ABDOMEN AND PELVIS WITH CONTRAST TECHNIQUE: Multidetector CT imaging of the abdomen and pelvis was performed using the standard protocol following bolus administration of intravenous contrast. RADIATION DOSE REDUCTION: This  exam was performed according to the departmental dose-optimization program which includes automated exposure control, adjustment of the mA and/or kV according to patient size and/or use of iterative reconstruction technique. CONTRAST:  114m OMNIPAQUE IOHEXOL 300 MG/ML  SOLN COMPARISON:  08/10/2021 FINDINGS: Lower chest: There are small patchy infiltrates in both lower lobes with interval worsening. Hepatobiliary: There is fatty infiltration in the liver. Gallbladder is unremarkable. Pancreas:  There is pancreatic atrophy. No focal abnormality is seen. Spleen: Unremarkable. Adrenals/Urinary Tract: Adrenals are not enlarged. There is no hydronephrosis. There are no renal or ureteral stones. Urinary bladder is not distended. Stomach/Bowel: There is marked distention of stomach. There is fluid in the lumen of visualized lower thoracic esophagus, possibly suggesting reflux. There is abnormal dilation of small-bowel loops. Small bowel loops measure up to 4.8 cm. There is a ventral hernia containing portions of small bowel loops and colon in the left lower quadrant. There is transition in the diameter of small-bowel loops within the hernia. There is mild diffuse wall thickening in the visualized portions of colon. There is liquid stool in the colon. There is decompression of sigmoid colon and rectum. Vascular/Lymphatic: Unremarkable. Reproductive: Other: There is no ascites or pneumoperitoneum. There is edema in subcutaneous plane in the abdominal wall, more so on the left side without loculated fluid collections. Musculoskeletal: Unremarkable. IMPRESSION: There is interval marked distention of stomach with fluid. There is abnormal dilation of proximal small bowel loops measuring up to 4.8 cm in diameter suggesting high-grade partial obstruction. Zone of transition in the small bowel loops appears to be within a ventral hernia in the left lower quadrant. Partly decompressed distal small bowel loops are seen within the hernial sac in the left lower quadrant. There is wall thickening along with fluid in the lumen in the ascending, transverse and descending colon suggesting colitis. Wall thickening in the colon appears less prominent. There is decompression of sigmoid colon. Findings suggest colitis and partial obstruction of left colon within the hernial sac in the left lower quadrant. There is no hydronephrosis. There are patchy infiltrates in both lower lung fields suggesting atelectasis/pneumonia. There is  fluid in the lumen of thoracic esophagus suggesting gastroesophageal reflux. Fatty liver. There is edema in subcutaneous plane, possibly suggesting anasarca. There are no loculated fluid collections in the abdominal wall. Other findings as described in the body of the report. Electronically Signed   By: PElmer PickerM.D.   On: 08/16/2021 12:45   CT ABDOMEN PELVIS W CONTRAST  Result Date: 08/10/2021 CLINICAL DATA:  Nausea vomiting. EXAM: CT ABDOMEN AND PELVIS WITH CONTRAST TECHNIQUE: Multidetector CT imaging of the abdomen and pelvis was performed using the standard protocol following bolus administration of intravenous contrast. RADIATION DOSE REDUCTION: This exam was performed according to the departmental dose-optimization program which includes automated exposure control, adjustment of the mA and/or kV according to patient size and/or use of iterative reconstruction technique. CONTRAST:  1081mOMNIPAQUE IOHEXOL 300 MG/ML  SOLN COMPARISON:  CT abdomen pelvis dated 04/09/2021. FINDINGS: Lower chest: The visualized lung bases are clear. No intra-abdominal free air or free fluid. Hepatobiliary: Diffuse fatty liver. No intrahepatic biliary dilatation. Small gallstone. No pericholecystic fluid or evidence of acute cholecystitis by CT. Pancreas: Unremarkable. No pancreatic ductal dilatation or surrounding inflammatory changes. Spleen: Normal in size without focal abnormality. Adrenals/Urinary Tract: The adrenal glands unremarkable. There is no hydronephrosis on either side. There is symmetric enhancement and excretion of contrast by both kidneys. The visualized ureters and urinary bladder appear unremarkable. Stomach/Bowel: Left  anterior pelvic colostomy with a large parastomal hernia as seen previously. There is diffuse inflammatory changes and thickening of the colon consistent with colitis. There is no bowel obstruction. The appendix is normal. Vascular/Lymphatic: The abdominal aorta and IVC are  unremarkable. No portal venous gas. Small scattered mesenteric lymph nodes. No adenopathy. Reproductive: The uterus is anteverted.  No adnexal masses. Other: Anterior pelvic wall incisional scar. Musculoskeletal: No acute osseous pathology. IMPRESSION: 1. Pancolitis. No bowel obstruction. Normal appendix. 2. Fatty liver. 3. Cholelithiasis. Electronically Signed   By: Anner Crete M.D.   On: 08/10/2021 02:25   US Abdomen Limited  Result Date: 08/10/2021 CLINICAL DATA:  Evaluate for abscess EXAM: ULTRASOUND ABDOMEN LIMITED COMPARISON:  None. FINDINGS: Limited ultrasound of the area around the stoma demonstrates normal appearing bowel loops with no evidence of organized fluid collection. IMPRESSION: No evidence abscess. Electronically Signed   By: Yetta Glassman M.D.   On: 08/10/2021 14:34   US Venous Img Upper Uni Right(DVT)  Result Date: 08/17/2021 CLINICAL DATA:  51 year old female with elevated D-dimer, concern for right upper extremity deep vein thrombosis. EXAM: RIGHT UPPER EXTREMITY VENOUS DOPPLER ULTRASOUND TECHNIQUE: Gray-scale sonography with graded compression, as well as color Doppler and duplex ultrasound were performed to evaluate the upper extremity deep venous system from the level of the subclavian vein and including the jugular, axillary, basilic, radial, ulnar and upper cephalic vein. Spectral Doppler was utilized to evaluate flow at rest and with distal augmentation maneuvers. COMPARISON:  None. FINDINGS: Contralateral Subclavian Vein: Respiratory phasicity is normal and symmetric with the symptomatic side. No evidence of thrombus. Normal compressibility. Internal Jugular Vein: No evidence of thrombus. Normal compressibility, respiratory phasicity and response to augmentation. Subclavian Vein: No evidence of thrombus. Normal compressibility, respiratory phasicity and response to augmentation. Axillary Vein: No evidence of thrombus. Normal compressibility, respiratory phasicity and  response to augmentation. Cephalic Vein: No evidence of thrombus. Normal compressibility, respiratory phasicity and response to augmentation. Basilic Vein: No evidence of thrombus. Normal compressibility, respiratory phasicity and response to augmentation. Brachial Veins: No evidence of thrombus. Normal compressibility, respiratory phasicity and response to augmentation. Radial Veins: No evidence of thrombus. Normal compressibility, respiratory phasicity and response to augmentation. Ulnar Veins: No evidence of thrombus. Normal compressibility, respiratory phasicity and response to augmentation. Other Findings:  None visualized. IMPRESSION: No evidence of deep vein thrombosis within the right upper extremity. Ruthann Cancer, MD [facility] Electronically Signed   By: Ruthann Cancer M.D.   On: 08/17/2021 07:38   DG CHEST PORT 1 VIEW  Result Date: 08/17/2021 CLINICAL DATA:  NG tube placement. EXAM: PORTABLE CHEST 1 VIEW COMPARISON:  Chest x-ray 08/17/2021. FINDINGS: Enteric tube tip extends into the stomach, distal tip is not included, but is likely beyond the mid stomach. There is gastric dilatation as seen on prior CT. There is left basilar atelectasis. Right-sided central venous catheter tip projects over the distal SVC. Cardiomediastinal silhouette is unchanged, the heart is enlarged. There is no pneumothorax or acute fracture. IMPRESSION: 1. NG tube reaches the mid stomach. The distal tip is not included on this image. 2. Stable dilated stomach. Electronically Signed   By: Ronney Asters M.D.   On: 08/17/2021 23:44   DG ABD ACUTE 2+V W 1V CHEST  Result Date: 08/26/2021 CLINICAL DATA:  Small bowel obstruction.  Emesis. EXAM: DG ABDOMEN ACUTE WITH 1 VIEW CHEST COMPARISON:  Chest and abdominal x-rays dated August 24, 2021. FINDINGS: Small bowel dilatation has improved, although there is  a single remaining dilated small bowel loop in the central abdomen  measuring up to 4.3 cm in diameter. Oral contrast throughout the colon and in the left lower quadrant ostomy bag. 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 No pneumoperitoneum. No radiopaque calculi or other significant radiographic abnormality is seen. Unchanged right upper extremity PICC line. Stable cardiomediastinal silhouette with normal heart size. Continued low lung volumes with right greater than left basilar atelectasis. No focal consolidation, pleural effusion, or pneumothorax. IMPRESSION: 1. Improved partial small bowel obstruction. 2.  No active cardiopulmonary disease. Electronically Signed   By: Titus Dubin M.D.   On: 08/26/2021 07:55   DG Abd Portable 1V  Result Date: 08/19/2021 CLINICAL DATA:  OG tube placement EXAM: PORTABLE ABDOMEN - 1 VIEW COMPARISON:  Plain film of the abdomen dated 08/18/2021. CT abdomen dated 08/16/2021. FINDINGS: The enteric tube is no longer visualized, presumably retracted to the level of the upper chest or beyond. Continued evidence of small-bowel obstruction with dilated gas-filled small bowel loops throughout the central abdomen. No evidence of free intraperitoneal air. IMPRESSION: 1. The enteric tube is no longer visualized, presumably retracted to the level of the upper chest or beyond the upper chest. Recommend repositioning into the stomach. 2. Continued evidence of small-bowel obstruction. Electronically Signed   By: Franki Cabot M.D.   On: 08/19/2021 13:08   ECHOCARDIOGRAM LIMITED  Result Date: 08/15/2021    ECHOCARDIOGRAM LIMITED REPORT   Patient Name:   Alyssa Carey Date of Exam: 08/14/2021 Medical Rec #:  [id number]       Height:       58.0 in Accession #:    [id number]      Weight:       187.4 lb Date of Birth:  [birth year]       BSA:          1.771 m Patient Age:    49 years        BP:           132/91 mmHg Patient Gender: F               HR:           124 bpm. Exam Location:  Inpatient Procedure: Limited Echo, Limited Color Doppler  and Cardiac Doppler Indications:     abnormal ecg  History:         Patient has prior history of Echocardiogram examinations, most                  recent 04/22/2021. Risk Factors:Hypertension.  Sonographer:     Johny Chess RDCS Referring Phys:  [id number] [place] Diagnosing Phys: Gwyndolyn Kaufman MD IMPRESSIONS  1. Left ventricular ejection fraction, by estimation, is 50 to 55%. The left ventricle has low normal function. The left ventricle has no regional wall motion abnormalities.  2. The right ventricular size is normal. There is normal pulmonary artery systolic pressure. The estimated right ventricular systolic pressure is 11.0 mmHg.  3. The mitral valve is grossly normal. Trivial mitral valve regurgitation.  4. The aortic valve was not well visualized. Aortic valve regurgitation is not visualized. No aortic stenosis is present.  5. The inferior vena cava is normal in size with greater than 50% respiratory variability, suggesting right atrial pressure of 3 mmHg.  6. Tachycardiac throughout study. Comparison(s): Compared to prior TTE in 04/2021, the EF appears slightly lower at ~55% (previously 60-65%) but patient is notably more tachycardic during current study. FINDINGS  Left Ventricle: Left ventricular ejection fraction, by estimation, is 50 to 55%. The left ventricle has low normal function. The left ventricle has no regional wall motion abnormalities. The left ventricular internal cavity size was normal in size. There is no left ventricular hypertrophy. Right Ventricle: The right ventricular size is normal. There is normal pulmonary artery systolic pressure. The tricuspid regurgitant velocity is 2.59 m/s, and with an assumed right atrial pressure of 3 mmHg, the estimated right ventricular systolic pressure is 50.9 mmHg. Left Atrium: Left atrial size was normal in size. Right Atrium: Right atrial size was normal in size. Pericardium: There is no evidence of pericardial effusion. Mitral Valve: The  mitral valve is grossly normal. There is mild thickening of the mitral valve leaflet(s). There is mild calcification of the mitral valve leaflet(s). Trivial mitral valve regurgitation. Tricuspid Valve: The tricuspid valve is normal in structure. Tricuspid valve regurgitation is mild. Aortic Valve: The aortic valve was not well visualized. Aortic valve regurgitation is not visualized. No aortic stenosis is present. Pulmonic Valve: The pulmonic valve was normal in structure. Pulmonic valve regurgitation is trivial. Venous: The inferior vena cava is normal in size with greater than 50% respiratory variability, suggesting right atrial pressure of 3 mmHg. IAS/Shunts: The atrial septum is grossly normal. LEFT VENTRICLE PLAX 2D LVIDd:         5.10 cm LVIDs:         3.60 cm  LV Volumes (MOD) LV vol d, MOD A4C: 81.9 ml LV vol s, MOD A4C: 45.7 ml LV SV MOD A4C:     81.9 ml AORTIC VALVE LVOT Vmax:   93.30 cm/s LVOT Vmean:  58.000 cm/s LVOT VTI:    0.110 m TRICUSPID VALVE TR Peak grad:   26.8 mmHg TR Vmax:        259.00 cm/s  SHUNTS Systemic VTI: 0.11 m Gwyndolyn Kaufman MD Electronically signed by Gwyndolyn Kaufman MD Signature Date/Time: 08/15/2021/10:07:59 AM    Final (Updated)    [place] EKG SITE RITE  Result Date: 08/17/2021 If Site Rite image not attached, placement could not be confirmed due to current cardiac rhythm.  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 Images obtained: 7, over 16 hours FINDINGS: Poor emptying of tracer from the stomach, potentially related to patient being supine for prolonged periods. Proximal small bowel loops normal  appearance. Dilated small bowel loops are seen in the hernia sac adjacent to the colostomy. However contrast passes beyond these into distal small bowel. Several of the small bowel loops immediately proximal to the hernia sac are significantly dilated however the course of the exam. However contrast passes beyond the parastomal herniation and into the RIGHT colon, which is decompressed. Findings are consistent with a partial small bowel obstruction at the level of the parastomal hernia. IMPRESSION: Partial small bowel obstruction at the level of the parastomal hernia, with several dilated loops proximal to the hernia as well as within the hernia sac, though contrast is able to pass beyond into nondistended distal small bowel loops and on to the RIGHT colon. Electronically Signed   By: Lavonia Dana M.D.   On: 08/23/2021 09:11    Microbiology: Results for orders placed  or performed during the hospital encounter of 08/09/21  Resp Panel by RT-PCR (Flu A&B, Covid) Nasopharyngeal Swab     Status: None   Collection Time: 08/09/21 12:11 AM   Specimen: Nasopharyngeal Swab; Nasopharyngeal(NP) swabs in vial transport medium  Result Value Ref Range Status   SARS Coronavirus 2 by RT PCR NEGATIVE NEGATIVE Final    Comment: (NOTE) SARS-CoV-2 target nucleic acids are NOT DETECTED.  The SARS-CoV-2 RNA is generally detectable in upper respiratory specimens during the acute phase of infection. The lowest concentration of SARS-CoV-2 viral copies this assay can detect is 138 copies/mL. A negative result does not preclude SARS-Cov-2 infection and should not be used as the sole basis for treatment or other patient management decisions. A negative result may occur with  improper specimen collection/handling, submission of specimen other than nasopharyngeal swab, presence of viral mutation(s) within the areas targeted by this assay, and inadequate number of viral copies(<138 copies/mL). A negative result must be combined  with clinical observations, patient history, and epidemiological information. The expected result is Negative.  Fact Sheet for Patients:  EntrepreneurPulse.com.au  Fact Sheet for Healthcare Providers:  IncredibleEmployment.be  This test is no t yet approved or cleared by the [place] FDA and  has been authorized for detection and/or diagnosis of SARS-CoV-2 by FDA under an Emergency Use Authorization (EUA). This EUA will remain  in effect (meaning this test can be used) for the duration of the COVID-19 declaration under Section 564(b)(1) of the Act, 21 U.S.C.section 360bbb-3(b)(1), unless the authorization is terminated  or revoked sooner.       Influenza A by PCR NEGATIVE NEGATIVE Final   Influenza B by PCR NEGATIVE NEGATIVE Final    Comment: (NOTE) The Xpert Xpress SARS-CoV-2/FLU/RSV plus assay is intended as an aid in the diagnosis of influenza from Nasopharyngeal swab specimens and should not be used as a sole basis for treatment. Nasal washings and aspirates are unacceptable for Xpert Xpress SARS-CoV-2/FLU/RSV testing.  Fact Sheet for Patients: EntrepreneurPulse.com.au  Fact Sheet for Healthcare Providers: IncredibleEmployment.be  This test is not yet approved or cleared by the [place] FDA and has been authorized for detection and/or diagnosis of SARS-CoV-2 by FDA under an Emergency Use Authorization (EUA). This EUA will remain in effect (meaning this test can be used) for the duration of the COVID-19 declaration under Section 564(b)(1) of the Act, 21 U.S.C. section 360bbb-3(b)(1), unless the authorization is terminated or revoked.  Performed at [facility], [address] [place], [place] [address]   MRSA Next Gen by PCR, Nasal     Status: None   Collection Time: 08/10/21  6:46 AM   Specimen: Nasal Mucosa; Nasal Swab  Result Value Ref Range Status   MRSA by PCR Next Gen NOT DETECTED  NOT DETECTED Final    Comment: (NOTE) The GeneXpert MRSA Assay (FDA approved for NASAL specimens only), is one component of a comprehensive MRSA colonization surveillance program. It is not intended to diagnose MRSA infection nor to guide or monitor treatment for MRSA infections. Test performance is not FDA approved in patients less than 70 years old. Performed at [facility], [address], [place], [place] [address]   C Difficile Quick Screen w PCR reflex     Status: None   Collection Time: 08/10/21  8:22 AM   Specimen: STOOL  Result Value Ref Range Status   C Diff antigen NEGATIVE NEGATIVE Final   C Diff toxin NEGATIVE NEGATIVE Final   C Diff interpretation No C.  difficile detected.  Final    Comment: Performed at [facility], [address], [place], [place] [address]  Gastrointestinal Panel by PCR , Stool     Status: None   Collection Time: 08/10/21  8:22 AM   Specimen: STOOL  Result Value Ref Range Status   Campylobacter species NOT DETECTED NOT DETECTED Final   Plesimonas shigelloides NOT DETECTED NOT DETECTED Final   Salmonella species NOT DETECTED NOT DETECTED Final   Yersinia enterocolitica NOT DETECTED NOT DETECTED Final   Vibrio species NOT DETECTED NOT DETECTED Final   Vibrio cholerae NOT DETECTED NOT DETECTED Final   Enteroaggregative E coli (EAEC) NOT DETECTED NOT DETECTED Final   Enteropathogenic E coli (EPEC) NOT DETECTED NOT DETECTED Final   Enterotoxigenic E coli (ETEC) NOT DETECTED NOT DETECTED Final   Shiga like toxin producing E coli (STEC) NOT DETECTED NOT DETECTED Final   Shigella/Enteroinvasive E coli (EIEC) NOT DETECTED NOT DETECTED Final   Cryptosporidium NOT DETECTED NOT DETECTED Final   Cyclospora cayetanensis NOT DETECTED NOT DETECTED Final   Entamoeba histolytica NOT DETECTED NOT DETECTED Final   Giardia lamblia NOT DETECTED NOT DETECTED Final   Adenovirus F40/41 NOT DETECTED NOT DETECTED Final   Astrovirus NOT DETECTED NOT DETECTED Final    Norovirus GI/GII NOT DETECTED NOT DETECTED Final   Rotavirus A NOT DETECTED NOT DETECTED Final   Sapovirus (I, II, IV, and V) NOT DETECTED NOT DETECTED Final    Comment: Performed at [facility], [address] [place], [place] [address]  Culture, blood (routine x 2)     Status: None   Collection Time: 08/10/21  2:32 PM   Specimen: Left Antecubital; Blood  Result Value Ref Range Status   Specimen Description LEFT ANTECUBITAL  Final   Special Requests   Final    BOTTLES DRAWN AEROBIC AND ANAEROBIC Blood Culture adequate volume   Culture   Final    NO GROWTH 5 DAYS Performed at [facility], [address] [place], [place] [address]    Report Status 08/15/2021 FINAL  Final  Culture, blood (routine x 2)     Status: None   Collection Time: 08/10/21  2:32 PM   Specimen: BLOOD LEFT HAND  Result Value Ref Range Status   Specimen Description BLOOD LEFT HAND  Final   Special Requests   Final    BOTTLES DRAWN AEROBIC AND ANAEROBIC Blood Culture results may not be optimal due to an excessive volume of blood received in culture bottles   Culture   Final    NO GROWTH 5 DAYS Performed at [facility], [address], [place],  [address]    Report Status 08/15/2021 FINAL  Final  Culture, blood (routine x 2)     Status: None (Preliminary result)   Collection Time: 08/24/21  8:26 AM   Specimen: BLOOD LEFT HAND  Result Value Ref Range Status   Specimen Description BLOOD LEFT HAND  Final   Special Requests   Final    BOTTLES DRAWN AEROBIC AND ANAEROBIC Blood Culture results may not be optimal due to an excessive volume of blood received in culture bottles   Culture   Final    NO GROWTH 3 DAYS Performed at [facility], [address] [place],  [address]    Report Status PENDING  Incomplete  Culture, blood (routine x 2)     Status: None (Preliminary result)   Collection Time: 08/24/21  8:26 AM   Specimen: BLOOD LEFT ARM  Result Value  Ref Range Status   Specimen  Description BLOOD LEFT ARM  Final   Special Requests   Final    BOTTLES DRAWN AEROBIC AND ANAEROBIC Blood Culture results may not be optimal due to an excessive volume of blood received in culture bottles   Culture   Final    NO GROWTH 3 DAYS Performed at [facility], [address], [place], [place] [address]    Report Status PENDING  Incomplete  Urine Culture     Status: Abnormal   Collection Time: 08/24/21  4:20 PM   Specimen: Urine, Clean Catch  Result Value Ref Range Status   Specimen Description   Final    URINE, CLEAN CATCH Performed at [facility], [address], [place], [place] [address]    Special Requests   Final    NONE Performed at [facility], [address], [place], [place] [address]    Culture (A)  Final    50,000 COLONIES/mL DIPHTHEROIDS(CORYNEBACTERIUM SPECIES) Standardized susceptibility testing for this organism is not available. Performed at [facility], [place] [address] [place], [place] [address]    Report Status 08/26/2021 FINAL  Final    Labs: CBC: Recent Labs  Lab 08/24/21 0404 08/25/21 0609 08/27/21 0217  WBC 19.3* 10.9* 9.7  HGB 8.7* 8.6* 9.3*  HCT 26.0* 26.2* 29.9*  MCV 93.9 96.3 95.2  PLT 376 300 888*   Basic Metabolic Panel: Recent Labs  Lab 08/22/21 0700 08/23/21 0817 08/24/21 0826 08/25/21 0609 08/27/21 0217  NA 135 132* 135 132* 132*  K 3.9 4.2 4.0 4.8 4.6  CL 99 95* 104 101 98  CO2 30 30 26 27 26   GLUCOSE 120* 134* 110* 124* 123*  BUN 7 7 7 8 11   CREATININE 0.33* 0.30* <0.30* 0.33* 0.33*  CALCIUM 8.1* 8.2* 8.1* 8.4* 8.9  MG 1.5* 1.6* 1.8 1.9 1.9  PHOS 3.1  --   --  3.9 4.7*   Liver Function Tests: Recent Labs  Lab 08/22/21 0700 08/24/21 0826 08/25/21 0609 08/27/21 0217  AST 17 35 24 26  ALT 9 15 15 15   ALKPHOS 64 176* 129* 120  BILITOT 0.1* 0.5 0.5 0.4  PROT 5.8* 6.1* 6.5 7.3  ALBUMIN 1.8* 2.0* 2.2* 2.7*   CBG: Recent Labs  Lab 08/24/21 1957 08/25/21 0550 08/25/21 1416 08/25/21 2117  08/26/21 0619  GLUCAP 107* 119* 110* 113* 105*    Discharge time spent: greater than 30 minutes.  Signed: Orson Eva, MD [facility] 08/28/2021

## 2021-08-28 NOTE — Progress Notes (Signed)
Nsg Discharge Note  Admit Date:  08/09/2021 Discharge date: 08/28/2021   Robbie Louis to be D/C'd Home per MD order.  AVS completed.  Patient/caregiver able to verbalize understanding.  Discharge Medication: Allergies as of 08/28/2021       Reactions   Pantoprazole    Stomach pain   Penicillins Hives   Did it involve swelling of the face/tongue/throat, SOB, or low BP? N Did it involve sudden or severe rash/hives, skin peeling, or any reaction on the inside of your mouth or nose? Y Did you need to seek medical attention at a hospital or doctor's office? N When did it last happen?  Childhood     If all above answers are "NO", may proceed with cephalosporin use.   Oxycodone Hcl Rash        Medication List     STOP taking these medications    famotidine 20 MG tablet Commonly known as: PEPCID   folic acid 1 MG tablet Commonly known as: FOLVITE   furosemide 40 MG tablet Commonly known as: Lasix   hydrOXYzine 10 MG tablet Commonly known as: ATARAX       TAKE these medications    acetaminophen 325 MG tablet Commonly known as: TYLENOL Take 325 mg by mouth every 6 (six) hours as needed for mild pain.   BOOST PO Take 1 Bottle by mouth 4 (four) times daily.   Metoprolol Tartrate 37.5 MG Tabs Take 37.5 mg by mouth 2 (two) times daily.   pantoprazole 40 MG tablet Commonly known as: PROTONIX Take 1 tablet (40 mg total) by mouth daily.   psyllium 95 % Pack Commonly known as: HYDROCIL/METAMUCIL Take 1 packet by mouth daily.        Discharge Assessment: Vitals:   08/28/21 0539 08/28/21 0828  BP: (!) 93/54 99/67  Pulse: 87 86  Resp: 18   Temp: 97.6 F (36.4 C)   SpO2: 98%    Skin clean, dry and intact without evidence of skin break down, no evidence of skin tears noted. IV catheter discontinued intact. Site without signs and symptoms of complications - no redness or edema noted at insertion site, patient denies c/o pain - only slight tenderness at site.   Dressing with slight pressure applied.  D/c Instructions-Education: Discharge instructions given to patient/family with verbalized understanding. D/c education completed with patient/family including follow up instructions, medication list, d/c activities limitations if indicated, with other d/c instructions as indicated by MD - patient able to verbalize understanding, all questions fully answered. Patient instructed to return to ED, call 911, or call MD for any changes in condition.  Patient escorted via [place], and D/C home via private auto.  Kathie Rhodes, RN 08/28/2021 11:29 AM

## 2021-08-28 NOTE — Progress Notes (Signed)
°   08/27/21 2251  Vitals  Temp (!) 97.4 F (36.3 C)  Temp Source Oral  BP 94/64  MAP (mmHg) 74  BP Location Left Arm  BP Method Automatic  Patient Position (if appropriate) Sitting  Pulse Rate 86  Pulse Rate Source Monitor  Resp 18  Level of Consciousness  Level of Consciousness Alert  MEWS COLOR  MEWS Score Color Green  Oxygen Therapy  SpO2 100 %  O2 Device Room Air  MEWS Score  MEWS Temp 0  MEWS Systolic 1  MEWS Pulse 0  MEWS RR 0  MEWS LOC 0  MEWS Score 1  Provider Notification  Provider Name/Title Dr. Josephine Cables  Date Provider Notified 08/27/21  Time Provider Notified 2259  Notification Type  (Secure Chat)  Notification Reason Other (Comment)  Provider response No new orders  Date of Provider Response 08/27/21  Time of Provider Response 2300   Patient has scheduled Metoprolol 37.11m notified MD of current BP and pulse. Advised per MD to hold metoprolol. Will continue to monitor patient.

## 2021-08-29 LAB — CULTURE, BLOOD (ROUTINE X 2)
Culture: NO GROWTH
Culture: NO GROWTH

## 2021-09-02 ENCOUNTER — Emergency Department (HOSPITAL_COMMUNITY): Payer: Self-pay

## 2021-09-02 ENCOUNTER — Other Ambulatory Visit: Payer: Self-pay

## 2021-09-02 ENCOUNTER — Observation Stay (HOSPITAL_COMMUNITY)
Admission: EM | Admit: 2021-09-02 | Discharge: 2021-09-03 | Disposition: A | Discharge status: Home / Self Care | Payer: Self-pay | Attending: Internal Medicine | Admitting: Internal Medicine

## 2021-09-02 ENCOUNTER — Encounter (HOSPITAL_COMMUNITY): Payer: Self-pay

## 2021-09-02 DIAGNOSIS — Z79899 Other long term (current) drug therapy: Secondary | ICD-10-CM | POA: Insufficient documentation

## 2021-09-02 DIAGNOSIS — Z20822 Contact with and (suspected) exposure to covid-19: Secondary | ICD-10-CM | POA: Insufficient documentation

## 2021-09-02 DIAGNOSIS — K435 Parastomal hernia without obstruction or  gangrene: Principal | ICD-10-CM | POA: Insufficient documentation

## 2021-09-02 DIAGNOSIS — E860 Dehydration: Secondary | ICD-10-CM | POA: Insufficient documentation

## 2021-09-02 DIAGNOSIS — R197 Diarrhea, unspecified: Secondary | ICD-10-CM | POA: Diagnosis present

## 2021-09-02 DIAGNOSIS — E876 Hypokalemia: Secondary | ICD-10-CM | POA: Insufficient documentation

## 2021-09-02 DIAGNOSIS — K709 Alcoholic liver disease, unspecified: Secondary | ICD-10-CM | POA: Insufficient documentation

## 2021-09-02 DIAGNOSIS — I471 Supraventricular tachycardia: Secondary | ICD-10-CM | POA: Insufficient documentation

## 2021-09-02 DIAGNOSIS — K9409 Other complications of colostomy: Secondary | ICD-10-CM | POA: Insufficient documentation

## 2021-09-02 DIAGNOSIS — A419 Sepsis, unspecified organism: Secondary | ICD-10-CM | POA: Insufficient documentation

## 2021-09-02 DIAGNOSIS — K279 Peptic ulcer, site unspecified, unspecified as acute or chronic, without hemorrhage or perforation: Secondary | ICD-10-CM | POA: Insufficient documentation

## 2021-09-02 DIAGNOSIS — I10 Essential (primary) hypertension: Secondary | ICD-10-CM | POA: Insufficient documentation

## 2021-09-02 DIAGNOSIS — Z933 Colostomy status: Secondary | ICD-10-CM

## 2021-09-02 DIAGNOSIS — E441 Mild protein-calorie malnutrition: Secondary | ICD-10-CM | POA: Insufficient documentation

## 2021-09-02 LAB — LACTIC ACID, PLASMA
Lactic Acid, Venous: 2.1 mmol/L (ref 0.5–1.9)
Lactic Acid, Venous: 1.6 mmol/L (ref 0.5–1.9)

## 2021-09-02 LAB — LIPASE, BLOOD: Lipase: 58 U/L — ABNORMAL HIGH (ref 11–51)

## 2021-09-02 LAB — URINALYSIS, ROUTINE W REFLEX MICROSCOPIC
Bilirubin Urine: NEGATIVE
Glucose, UA: NEGATIVE mg/dL
Hgb urine dipstick: NEGATIVE
Ketones, ur: 5 mg/dL — AB
Nitrite: NEGATIVE
Protein, ur: 30 mg/dL — AB
Specific Gravity, Urine: 1.027 (ref 1.005–1.030)
pH: 5 (ref 5.0–8.0)

## 2021-09-02 LAB — COMPREHENSIVE METABOLIC PANEL
ALT: 23 U/L (ref 0–44)
AST: 42 U/L — ABNORMAL HIGH (ref 15–41)
Albumin: 3.2 g/dL — ABNORMAL LOW (ref 3.5–5.0)
Alkaline Phosphatase: 122 U/L (ref 38–126)
Anion gap: 6 (ref 5–15)
BUN: 7 mg/dL (ref 6–20)
CO2: 26 mmol/L (ref 22–32)
Calcium: 9.1 mg/dL (ref 8.9–10.3)
Chloride: 106 mmol/L (ref 98–111)
Creatinine, Ser: 0.53 mg/dL (ref 0.44–1.00)
GFR, Estimated: >60 mL/min (ref >60)
Glucose, Bld: 108 mg/dL — ABNORMAL HIGH (ref 70–99)
Potassium: 2.8 mmol/L — ABNORMAL LOW (ref 3.5–5.1)
Sodium: 138 mmol/L (ref 135–145)
Total Bilirubin: 0.6 mg/dL (ref 0.3–1.2)
Total Protein: 8.4 g/dL — ABNORMAL HIGH (ref 6.5–8.1)

## 2021-09-02 LAB — PROTIME-INR
INR: 1 (ref 0.8–1.2)
Prothrombin Time: 13.6 seconds (ref 11.4–15.2)

## 2021-09-02 LAB — RESP PANEL BY RT-PCR (FLU A&B, COVID) ARPGX2
Influenza A by PCR: NEGATIVE
Influenza B by PCR: NEGATIVE
SARS Coronavirus 2 by RT PCR: NEGATIVE

## 2021-09-02 LAB — CBC WITH DIFFERENTIAL/PLATELET
Abs Immature Granulocytes: 0.01 10*3/uL (ref 0.00–0.07)
Basophils Absolute: 0 10*3/uL (ref 0.0–0.1)
Basophils Relative: 1 %
Eosinophils Absolute: 0.2 10*3/uL (ref 0.0–0.5)
Eosinophils Relative: 4 %
HCT: 34.1 % — ABNORMAL LOW (ref 36.0–46.0)
Hemoglobin: 10.6 g/dL — ABNORMAL LOW (ref 12.0–15.0)
Immature Granulocytes: 0 %
Lymphocytes Relative: 32 %
Lymphs Abs: 1.8 10*3/uL (ref 0.7–4.0)
MCH: 30.8 pg (ref 26.0–34.0)
MCHC: 31.1 g/dL (ref 30.0–36.0)
MCV: 99.1 fL (ref 80.0–100.0)
Monocytes Absolute: 0.4 10*3/uL (ref 0.1–1.0)
Monocytes Relative: 7 %
Neutro Abs: 3.3 10*3/uL (ref 1.7–7.7)
Neutrophils Relative %: 56 %
Platelets: 458 10*3/uL — ABNORMAL HIGH (ref 150–400)
RBC: 3.44 MIL/uL — ABNORMAL LOW (ref 3.87–5.11)
RDW: 16 % — ABNORMAL HIGH (ref 11.5–15.5)
WBC: 5.7 10*3/uL (ref 4.0–10.5)
nRBC: 0 % (ref 0.0–0.2)

## 2021-09-02 LAB — I-STAT BETA HCG BLOOD, ED (MC, WL, AP ONLY): I-stat hCG, quantitative: <5 m[IU]/mL (ref <5)

## 2021-09-02 LAB — MAGNESIUM: Magnesium: 1.6 mg/dL — ABNORMAL LOW (ref 1.7–2.4)

## 2021-09-02 LAB — APTT: aPTT: 38 seconds — ABNORMAL HIGH (ref 24–36)

## 2021-09-02 MED ORDER — ACETAMINOPHEN 325 MG PO TABS: 325.0000 mg | ORAL_TABLET | Freq: Four times a day (QID) | ORAL | Status: DC | PRN

## 2021-09-02 MED ORDER — POTASSIUM CHLORIDE 10 MEQ/100ML IV SOLN
10.0000 meq | INTRAVENOUS | Status: AC
  Administered 2021-09-02 (×3): 10 meq via INTRAVENOUS
  Filled 2021-09-02 (×3): qty 100

## 2021-09-02 MED ORDER — PANTOPRAZOLE SODIUM 40 MG PO TBEC: 40.0000 mg | DELAYED_RELEASE_TABLET | Freq: Every day | ORAL | Status: DC

## 2021-09-02 MED ORDER — LACTATED RINGERS IV SOLN
INTRAVENOUS | Status: AC
  Administered 2021-09-02 – 2021-09-03 (×2): 150 mL/h via INTRAVENOUS

## 2021-09-02 MED ORDER — LACTATED RINGERS IV BOLUS (SEPSIS)
1000.0000 mL | Freq: Once | INTRAVENOUS | Status: AC
  Administered 2021-09-02: 1000 mL via INTRAVENOUS

## 2021-09-02 MED ORDER — MAGNESIUM SULFATE 2 GM/50ML IV SOLN
2.0000 g | Freq: Once | INTRAVENOUS | Status: AC
  Administered 2021-09-02: 2 g via INTRAVENOUS
  Filled 2021-09-02: qty 50

## 2021-09-02 MED ORDER — NYSTATIN 100000 UNIT/GM EX POWD
Freq: Once | CUTANEOUS | Status: AC
Start: 2021-09-02 — End: 2021-09-02
  Administered 2021-09-02: 1 via TOPICAL
  Filled 2021-09-02: qty 15

## 2021-09-02 MED ORDER — IOHEXOL 300 MG/ML  SOLN
100.0000 mL | Freq: Once | INTRAMUSCULAR | Status: AC | PRN
  Administered 2021-09-02: 100 mL via INTRAVENOUS

## 2021-09-02 MED ORDER — METOPROLOL TARTRATE 25 MG PO TABS
37.5000 mg | ORAL_TABLET | Freq: Two times a day (BID) | ORAL | Status: DC
  Administered 2021-09-03 (×2): 37.5 mg via ORAL
  Filled 2021-09-02 (×2): qty 2

## 2021-09-02 MED ORDER — MORPHINE SULFATE (PF) 4 MG/ML IV SOLN
4.0000 mg | Freq: Once | INTRAVENOUS | Status: AC
  Administered 2021-09-02: 4 mg via INTRAVENOUS
  Filled 2021-09-02: qty 1

## 2021-09-02 MED ORDER — FENTANYL CITRATE PF 50 MCG/ML IJ SOSY
50.0000 ug | PREFILLED_SYRINGE | Freq: Once | INTRAMUSCULAR | Status: AC
  Administered 2021-09-02: 50 ug via INTRAVENOUS
  Filled 2021-09-02: qty 1

## 2021-09-02 MED ORDER — POTASSIUM CHLORIDE CRYS ER 20 MEQ PO TBCR
40.0000 meq | EXTENDED_RELEASE_TABLET | Freq: Once | ORAL | Status: AC
  Administered 2021-09-02: 40 meq via ORAL
  Filled 2021-09-02: qty 2

## 2021-09-02 NOTE — ED Provider Triage Note (Signed)
Emergency Medicine Provider Triage Evaluation Note  Alyssa Carey , a 51 y.o. female  was evaluated in triage.  Pt complains of abdominal pain. Patient endorses sharp pain around stoma. Normal output. Admits to nausea and fatigue. No vomiting. Denies fever and chills. Chart reviewed.  Patient admitted to hospital 1/24 to 2/12 due to pancolitis.  Review of Systems  Positive: Abdominal pain Negative: fever  Physical Exam  BP 126/90 (BP Location: Left Arm)    Pulse (!) 108    Temp 98.5 F (36.9 C) (Oral)    Resp 18    Ht 4' 10"  (1.473 m)    Wt 74.8 kg    SpO2 100%    BMI 34.49 kg/m  Gen:   Awake, no distress   Resp:  Normal effort  MSK:   Moves extremities without difficulty  Other:  Large bandage over stoma without a bag. Tenderness around bandage.  Medical Decision Making  Medically screening exam initiated at 3:17 PM.  Appropriate orders placed.  Alyssa Carey was informed that the remainder of the evaluation will be completed by another provider, this initial triage assessment does not replace that evaluation, and the importance of remaining in the ED until their evaluation is complete.  Abdominal labs ordered   Karie Kirks 09/02/21 1524

## 2021-09-02 NOTE — Assessment & Plan Note (Addendum)
Repleted. °

## 2021-09-02 NOTE — Assessment & Plan Note (Addendum)
Cont ppi. Has H/o gastritis by prior EGD.Advised to not use NSAIDs

## 2021-09-02 NOTE — H&P (Signed)
History and Physical    Alyssa Carey:[id number] DOB: January 10, 1971 DOA: 09/02/2021  DOS: the patient was seen and examined on 09/02/2021  PCP: Caryl Bis, MD   Patient coming from: Home  I have personally briefly reviewed patient's old medical records in [place]  Alyssa Carey with h/o necrotizing fasciitis leading to creation of colostomy in 2021. She has had intermittent problems with non-infectious colitis. Last Colonoscopy in 2022 w/o active colitis. She also has h/o gastritis. Recent hospitalization 1/24-08/21/21 for dehydration, hypokalemia and hypomagnesemia 2/2 high volume lossed via colostomy. Also had SBO which resolved without intervention. Since discharge she has continued to have peristomal pain for which she has been taking Motrin, frequent diarrhea and poor po intake. Due to pain and weakness she presented to [facility] for evaluation.   ED Course: afebrile, BP stalbe. Lab revealed K 2.8,m Mg 1.6, lipase 58 WBC 5.7, Hgb 10.6. Lactic acid 2.1 to 1.6. In ED she received 1L LR, 10 meq K, IV mag. TRH called to admit for continued management of dehydration and electrolyte imbalance.   Review of Systems:  Review of Systems  Constitutional: Negative.   HENT: Negative.    Eyes: Negative.   Respiratory: Negative.    Cardiovascular: Negative.   Gastrointestinal:  Positive for diarrhea, nausea and vomiting.  Genitourinary: Negative.   Musculoskeletal: Negative.   Skin: Negative.   Neurological: Negative.   Endo/Heme/Allergies: Negative.   Psychiatric/Behavioral: Negative.     Past Medical History:  Diagnosis Date   HTN (hypertension) 10/05/2020   Necrotizing fasciitis [facility])     Past Surgical History:  Procedure Laterality Date   BIOPSY  10/07/2020   Procedure: BIOPSY;  Surgeon: Daneil Dolin, MD;  [facility]: [facility];  Service: Endoscopy;;   COLONOSCOPY WITH PROPOFOL N/A 10/07/2020   single healing rectal ulcer in distal rectum with surrounding mucosal  friability and markedly abnormal proximal colon with ulceration s/p biopsies.   COLONOSCOPY WITH PROPOFOL  02/22/2021   Procedure: COLONOSCOPY WITH PROPOFOL;  Surgeon: [name], DO;  [facility];  Service: Endoscopy;;   ESOPHAGOGASTRODUODENOSCOPY (EGD) WITH PROPOFOL N/A 10/07/2020   non-bleeding gastric ulcer . Pathology with H.pylori negative   ESOPHAGOGASTRODUODENOSCOPY (EGD) WITH PROPOFOL N/A 12/27/2020   Procedure: ESOPHAGOGASTRODUODENOSCOPY (EGD) WITH PROPOFOL;  Surgeon: [name], DO;  [facility];  Service: Endoscopy;  Laterality: N/A;  1:00pm   FLEXIBLE SIGMOIDOSCOPY N/A 01/11/2020   Procedure: FLEXIBLE SIGMOIDOSCOPY;  Surgeon: Carol Ada, MD;  Location: [facility];  Service: Gastroenterology;  Laterality: N/A;   INCISION AND DRAINAGE PERIRECTAL ABSCESS N/A 12/25/2019   Procedure: IRRIGATION AND DEBRIDEMENT BUTTOCKS, LAP LOOP COLOSTOMY;  Surgeon: Greer Pickerel, MD;  Location: [facility];  Service: General;  Laterality: N/A;   IRRIGATION AND DEBRIDEMENT ABSCESS N/A 12/23/2019   Procedure: EXCISION AND DEBRIDEMENT LEFT BUTTOCK AND PERINEUM;  Surgeon: Clovis Riley, MD;  Location: [facility];  Service: General;  Laterality: N/A;   LAPAROSCOPIC LOOP COLOSTOMY N/A 12/25/2019   Procedure: LAPAROSCOPIC LOOP COLOSTOMY;  Surgeon: Greer Pickerel, MD;  Location: [facility];  Service: General;  Laterality: N/A;   Soc Hx -  single. Has one son. Her son and brother live with her. On disability. 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   reports that she has never smoked. She has never used smokeless tobacco. She reports that she does not currently use alcohol. She reports that she does not currently use drugs.  Allergies  Allergen Reactions   Pantoprazole  Stomach pain   Penicillins Hives    Did it involve swelling of the face/tongue/throat, SOB, or low BP? N Did it involve sudden or severe rash/hives, skin peeling, or any reaction on the inside of your mouth or nose?  Y Did you need to seek medical attention at a hospital or doctor's office? N When did it last happen?  Childhood     If all above answers are "NO", may proceed with cephalosporin use.    Oxycodone Hcl Rash    Family History  Problem Relation Age of Onset   Colon polyps Mother        does not believe adenomas   Arrhythmia Mother    Colon cancer Neg Hx     Prior to Admission medications   Medication Sig Start Date End Date Taking? Authorizing Provider  metoprolol tartrate 37.5 MG TABS Take 37.5 mg by mouth 2 (two) times daily. 08/28/21  Yes Tat, Shanon Brow, MD  Nutritional Supplements (BOOST PO) Take 1 Bottle by mouth 4 (four) times daily.   Yes [provider]  psyllium (HYDROCIL/METAMUCIL) 95 % PACK Take 1 packet by mouth daily. Patient taking differently: Take 1 packet by mouth daily as needed for mild constipation. 08/28/21  Yes Tat, Shanon Brow, MD  acetaminophen (TYLENOL) 325 MG tablet Take 325 mg by mouth every 6 (six) hours as needed for mild pain. Patient not taking: Reported on 09/02/2021    [provider]  pantoprazole (PROTONIX) 40 MG tablet Take 1 tablet (40 mg total) by mouth daily. Patient not taking: Reported on 09/02/2021 08/28/21   Orson Eva, MD    Physical Exam: Vitals:   09/02/21 2130 09/02/21 2200 09/02/21 2230 09/02/21 2315  BP: (!) 143/102 138/88 (!) 136/103 (!) 136/96  Pulse: (!) 104 95 87 81  Resp: (!) 31 (!) 28 (!) 28 16  Temp:      TempSrc:      SpO2: 98% 99% 99% 100%  Weight:      Height:        Physical Exam Vitals and nursing note reviewed.  Constitutional:      Appearance: She is well-developed.  HENT:     Head: Normocephalic and atraumatic.     Mouth/Throat:     Pharynx: Oropharynx is clear.     Comments: Dry mucus membrances Eyes:     Pupils: Pupils are equal, round, and reactive to light.  Cardiovascular:     Rate and Rhythm: Normal rate and regular rhythm.     Comments: Split S2 Pulmonary:     Effort: Pulmonary effort is  normal.     Breath sounds: Normal breath sounds.  Abdominal:     General: Abdomen is protuberant. Bowel sounds are normal.     Palpations: Abdomen is soft.     Comments: Colostomy LLQ with large stomal hernia. Soft, nontender.   Musculoskeletal:        General: Normal range of motion.  Skin:    General: Skin is warm and dry.     Comments: Appears dry with mild excoriations arms  Neurological:     General: No focal deficit present.     Mental Status: She is alert and oriented to person, place, and time.  Psychiatric:        Mood and Affect: Mood normal.     Labs on Admission: I have personally reviewed following labs and imaging studies  CBC: Recent Labs  Lab 08/27/21 0217 09/02/21 1551  WBC 9.7 5.7  NEUTROABS  --  3.3  HGB 9.3* 10.6*  HCT 29.9* 34.1*  MCV 95.2 99.1  PLT 434* 583*   Basic Metabolic Panel: Recent Labs  Lab 08/27/21 0217 09/02/21 1551 09/02/21 1820  NA 132* 138  --   K 4.6 2.8*  --   CL 98 106  --   CO2 26 26  --   GLUCOSE 123* 108*  --   BUN 11 7  --   CREATININE 0.33* 0.53  --   CALCIUM 8.9 9.1  --   MG 1.9  --  1.6*  PHOS 4.7*  --   --    GFR: Estimated Creatinine Clearance: 74 mL/min (by C-G formula based on SCr of 0.53 mg/dL). Liver Function Tests: Recent Labs  Lab 08/27/21 0217 09/02/21 1551  AST 26 42*  ALT 15 23  ALKPHOS 120 122  BILITOT 0.4 0.6  PROT 7.3 8.4*  ALBUMIN 2.7* 3.2*   Recent Labs  Lab 09/02/21 1551  LIPASE 58*   No results for input(s): AMMONIA in the last 168 hours. Coagulation Profile: Recent Labs  Lab 09/02/21 1820  INR 1.0   Cardiac Enzymes: No results for input(s): CKTOTAL, CKMB, CKMBINDEX, TROPONINI in the last 168 hours. BNP (last 3 results) No results for input(s): PROBNP in the last 8760 hours. HbA1C: No results for input(s): HGBA1C in the last 72 hours. CBG: No results for input(s): GLUCAP in the last 168 hours. Lipid Profile: No results for input(s): CHOL, HDL, LDLCALC, TRIG, CHOLHDL,  LDLDIRECT in the last 72 hours. Thyroid Function Tests: No results for input(s): TSH, T4TOTAL, FREET4, T3FREE, THYROIDAB in the last 72 hours. Anemia Panel: No results for input(s): VITAMINB12, FOLATE, FERRITIN, TIBC, IRON, RETICCTPCT in the last 72 hours. Urine analysis:    Component Value Date/Time   COLORURINE AMBER (A) 09/02/2021 1520   APPEARANCEUR HAZY (A) 09/02/2021 1520   LABSPEC 1.027 09/02/2021 1520   PHURINE 5.0 09/02/2021 1520   GLUCOSEU NEGATIVE 09/02/2021 1520   HGBUR NEGATIVE 09/02/2021 1520   BILIRUBINUR NEGATIVE 09/02/2021 1520   KETONESUR 5 (A) 09/02/2021 1520   PROTEINUR 30 (A) 09/02/2021 1520   NITRITE NEGATIVE 09/02/2021 1520   LEUKOCYTESUR TRACE (A) 09/02/2021 1520    Radiological Exams on Admission: I have personally reviewed images CT Abdomen Pelvis W Contrast  Result Date: 09/02/2021 CLINICAL DATA:  History of abdominal pain and pancolitis with ostomy and swollen stoma site. EXAM: CT ABDOMEN AND PELVIS WITH CONTRAST TECHNIQUE: Multidetector CT imaging of the abdomen and pelvis was performed using the standard protocol following bolus administration of intravenous contrast. RADIATION DOSE REDUCTION: This exam was performed according to the departmental dose-optimization program which includes automated exposure control, adjustment of the mA and/or kV according to patient size and/or use of iterative reconstruction technique. CONTRAST:  186m OMNIPAQUE IOHEXOL 300 MG/ML  SOLN COMPARISON:  August 16, 2021 FINDINGS: Lower chest: Moderate severity scarring, atelectasis and/or infiltrate is seen within the posterior aspect of the right lung base. This is decreased in severity when compared to the prior study. Hepatobiliary: No focal liver abnormality is seen. Tiny gallstones are seen within the lumen of an otherwise normal-appearing gallbladder. Pancreas: Unremarkable. No pancreatic ductal dilatation or surrounding inflammatory changes. Spleen: Normal in size without focal  abnormality. Adrenals/Urinary Tract: Adrenal glands are unremarkable. Kidneys are normal, without renal calculi, focal lesion, or hydronephrosis. The urinary bladder is partially contracted with mild diffuse urinary bladder wall thickening. Stomach/Bowel: Stomach is within normal limits. The appendix is not clearly identified. Barium is seen within the  expected region of the distal ileum, with contrast also noted throughout the ascending, transverse and proximal descending colon. There is no evidence of bowel dilatation. A left lower quadrant ostomy site is noted. There is a 19.5 cm x 12.9 cm associated parastomal hernia. Vascular/Lymphatic: No significant vascular findings are present. No enlarged abdominal or pelvic lymph nodes. Reproductive: The uterus and bilateral adnexa are limited in evaluation secondary to streak artifact from adjacent barium. Other: No abdominopelvic ascites. Musculoskeletal: No acute or significant osseous findings. IMPRESSION: 1. Cholelithiasis. 2. Left lower quadrant ostomy site with a large parastomal hernia, mildly increased in size when compared to the prior exam. 3. Moderate severity right basilar scarring, atelectasis and/or infiltrate, decreased in severity when compared to the prior study. Electronically Signed   By: Virgina Norfolk M.D.   On: 09/02/2021 20:05   DG Chest Portable 1 View  Result Date: 09/02/2021 CLINICAL DATA:  Shortness of breath EXAM: PORTABLE CHEST 1 VIEW COMPARISON:  08/24/2021 FINDINGS: Cardiac shadow is stable. Right-sided PICC has been removed in the interval. Elevation the right hemidiaphragm is again seen. Minimal right basilar atelectasis is again noted. No new focal infiltrate is seen. No bony abnormality is noted. IMPRESSION: Stable right basilar atelectasis. Electronically Signed   By: Inez Catalina M.D.   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 Received IV mag in ED  Plan MagOxide 400 mg daily PO starting in AM  Dehydration- (present on admission) Recurrent problem 2/2 high volume vluid loss via ostomy and poor PO intake. Received 1 L LR in ED  Plan Continue LR at 150 cc/hr  Hypokalemia due to excessive gastrointestinal loss of potassium Recurrrent problem most likely 2/2 GI losses with chronic diarrhea. Patient received 10 meq K IV in ED  Plan PO potassium starting in AM  F/u Bmet in AM  Paroxysmal SVT (supraventricular tachycardia) ([place])- (present on admission) New problem at last admission. She reports she never had palpitations. She has been taking BB. Sinus rhythm on telemetry  Plan Continue current medical regimen  No indication for telemetry at this time   PUD (peptic ulcer disease)- (present on admission) H/o gastritis by prior EGD. Reports no current EtOH use. No c/o dyspepsia but does have frequent N/V. She does take NSAIDs for pain relief  Plan  Continue PPI  Advised to not use NSAIDs  Mild protein malnutrition ([place])- (present on admission) Due to poor intake due to Nausea and some vomiting. During last admission received parenteral nutrtion.  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 Was to have follow up with possible take down but her surgeon moved to the [facility] and she was never seen back for f/u per her report. 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  Plan Continue home regimen  Alcoholic liver disease ([place])- (present on  admission) Patient reports abstinence. MIld elevation in LFT. No treatment indicated       DVT prophylaxis: Lovenox Code Status: Full Code Family Communication: patient has spoken with son. Did not want me to call  Disposition Plan: home when lytes corrected  Consults called: none  Admission status: Observation, Med-Surg   Adella Hare, MD [facility] 09/02/2021, 11:53 PM

## 2021-09-02 NOTE — ED Provider Notes (Signed)
[facility] DEPT Provider Note   CSN: [id number] Arrival date & time: 09/02/21  1436     History  Chief Complaint  Patient presents with   Abdominal Pain    Alyssa Carey is a 51 y.o. female.   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  She reports she is having worsening pain over the ostomy site although there is normal output.  She reports she has been unable to tolerate oral intake secondary to nausea and presenting due to water consumption.   Home Medications Prior to Admission medications   Medication Sig Start Date End Date Taking? Authorizing Provider  acetaminophen (TYLENOL) 325 MG tablet Take 325 mg by mouth every 6 (six) hours as needed for mild pain.    [provider]  metoprolol tartrate 37.5 MG TABS Take 37.5 mg by mouth 2 (two) times daily. 08/28/21   Orson Eva, MD  Nutritional Supplements (BOOST PO) Take 1 Bottle by mouth 4 (four) times daily.    [provider]  pantoprazole (PROTONIX) 40 MG tablet Take 1 tablet (40 mg total) by mouth daily. 08/28/21   Orson Eva, MD  psyllium (HYDROCIL/METAMUCIL) 95 % PACK Take 1 packet by mouth daily. 08/28/21   Orson Eva, MD      Allergies    Pantoprazole, Penicillins, and Oxycodone hcl    Review of Systems   Review of Systems  Gastrointestinal:  Positive for abdominal pain.   Physical Exam Updated Vital Signs BP 138/88    Pulse 95    Temp 98.5 F (36.9 C) (Oral)    Resp (!) 28    Ht 4' 10"  (1.473 m)    Wt 74.8 kg    SpO2 99%    BMI 34.49 kg/m  Physical Exam Vitals and nursing note reviewed. Exam conducted with a chaperone present.  Constitutional:      Appearance: Normal appearance. She is obese.  HENT:     Head: Normocephalic and atraumatic.  Eyes:     General: No scleral icterus.        Right eye: No discharge.        Left eye: No discharge.     Extraocular Movements: Extraocular movements intact.     Pupils: Pupils are equal, round, and reactive to light.  Cardiovascular:     Rate and Rhythm: Regular rhythm. Tachycardia present.     Pulses: Normal pulses.     Heart sounds: Normal heart sounds. No murmur heard.   No friction rub. No gallop.  Pulmonary:     Effort: Pulmonary effort is normal. No respiratory distress.     Breath sounds: Normal breath sounds.  Abdominal:     General: Abdomen is flat. Bowel sounds are normal. There is no distension.     Palpations: Abdomen is soft.     Tenderness: There is abdominal tenderness.     Comments: Tenderness surrounding the ostomy bag, no rigidity or guarding.  Genitourinary:    Comments: Ostomy bag was dislodged in the ED waiting room.  Currently dressed. Skin:    General: Skin is warm and dry.     Coloration: Skin is not jaundiced.  Neurological:     Mental Status: She is alert. Mental status is at baseline.     Coordination: Coordination normal.    ED Results / Procedures / Treatments   Labs (all  labs ordered are listed, but only abnormal results are displayed) Labs Reviewed  CBC WITH DIFFERENTIAL/PLATELET - Abnormal; Notable for the following components:      Result Value   RBC 3.44 (*)    Hemoglobin 10.6 (*)    HCT 34.1 (*)    RDW 16.0 (*)    Platelets 458 (*)    All other components within normal limits  COMPREHENSIVE METABOLIC PANEL - Abnormal; Notable for the following components:   Potassium 2.8 (*)    Glucose, Bld 108 (*)    Total Protein 8.4 (*)    Albumin 3.2 (*)    AST 42 (*)    All other components within normal limits  LIPASE, BLOOD - Abnormal; Notable for the following components:   Lipase 58 (*)    All other components within normal limits  URINALYSIS, ROUTINE W REFLEX MICROSCOPIC - Abnormal; Notable for the following components:   Color, Urine AMBER (*)    APPearance HAZY (*)    Ketones,  ur 5 (*)    Protein, ur 30 (*)    Leukocytes,Ua TRACE (*)    Bacteria, UA RARE (*)    All other components within normal limits  LACTIC ACID, PLASMA - Abnormal; Notable for the following components:   Lactic Acid, Venous 2.1 (*)    All other components within normal limits  APTT - Abnormal; Notable for the following components:   aPTT 38 (*)    All other components within normal limits  MAGNESIUM - Abnormal; Notable for the following components:   Magnesium 1.6 (*)    All other components within normal limits  RESP PANEL BY RT-PCR (FLU A&B, COVID) ARPGX2  CULTURE, BLOOD (ROUTINE X 2)  CULTURE, BLOOD (ROUTINE X 2)  URINE CULTURE  LACTIC ACID, PLASMA  PROTIME-INR  I-STAT BETA HCG BLOOD, ED ([facility], [facility], [facility] ONLY)    EKG EKG Interpretation  Date/Time:  Friday September 02 2021 17:53:24 EST Ventricular Rate:  96 PR Interval:  138 QRS Duration: 94 QT Interval:  414 QTC Calculation: 524 R Axis:   -21 Text Interpretation: Sinus rhythm Borderline left axis deviation Abnrm T, consider ischemia, anterolateral lds Prolonged QT interval No significant change since last tracing Confirmed by Deno Etienne [phone]) on 09/02/2021 6:47:33 PM  Radiology CT Abdomen Pelvis W Contrast  Result Date: 09/02/2021 CLINICAL DATA:  History of abdominal pain and pancolitis with ostomy and swollen stoma site. EXAM: CT ABDOMEN AND PELVIS WITH CONTRAST TECHNIQUE: Multidetector CT imaging of the abdomen and pelvis was performed using the standard protocol following bolus administration of intravenous contrast. RADIATION DOSE REDUCTION: This exam was performed according to the departmental dose-optimization program which includes automated exposure control, adjustment of the mA and/or kV according to patient size and/or use of iterative reconstruction technique. CONTRAST:  179m OMNIPAQUE IOHEXOL 300 MG/ML  SOLN COMPARISON:  August 16, 2021 FINDINGS: Lower chest: Moderate severity scarring, atelectasis and/or infiltrate is  seen within the posterior aspect of the right lung base. This is decreased in severity when compared to the prior study. Hepatobiliary: No focal liver abnormality is seen. Tiny gallstones are seen within the lumen of an otherwise normal-appearing gallbladder. Pancreas: Unremarkable. No pancreatic ductal dilatation or surrounding inflammatory changes. Spleen: Normal in size without focal abnormality. Adrenals/Urinary Tract: Adrenal glands are unremarkable. Kidneys are normal, without renal calculi, focal lesion, or hydronephrosis. The urinary bladder is partially contracted with mild diffuse urinary bladder wall thickening. Stomach/Bowel: Stomach is within normal limits. The appendix is not clearly identified. Barium  is seen within the expected region of the distal ileum, with contrast also noted throughout the ascending, transverse and proximal descending colon. There is no evidence of bowel dilatation. A left lower quadrant ostomy site is noted. There is a 19.5 cm x 12.9 cm associated parastomal hernia. Vascular/Lymphatic: No significant vascular findings are present. No enlarged abdominal or pelvic lymph nodes. Reproductive: The uterus and bilateral adnexa are limited in evaluation secondary to streak artifact from adjacent barium. Other: No abdominopelvic ascites. Musculoskeletal: No acute or significant osseous findings. IMPRESSION: 1. Cholelithiasis. 2. Left lower quadrant ostomy site with a large parastomal hernia, mildly increased in size when compared to the prior exam. 3. Moderate severity right basilar scarring, atelectasis and/or infiltrate, decreased in severity when compared to the prior study. Electronically Signed   By: Virgina Norfolk M.D.   On: 09/02/2021 20:05   DG Chest Portable 1 View  Result Date: 09/02/2021 CLINICAL DATA:  Shortness of breath EXAM: PORTABLE CHEST 1 VIEW COMPARISON:  08/24/2021 FINDINGS: Cardiac shadow is stable. Right-sided PICC has been removed in the interval. Elevation  the right hemidiaphragm is again seen. Minimal right basilar atelectasis is again noted. No new focal infiltrate is seen. No bony abnormality is noted. IMPRESSION: Stable right basilar atelectasis. Electronically Signed   By: Inez Catalina M.D.   On: 09/02/2021 19:16    Procedures .Critical Care Performed by: Sherrill Raring, PA-C Authorized by: Sherrill Raring, PA-C   Critical care provider statement:    Critical care time (minutes):  30   Critical care start time:  09/02/2021 8:00 PM   Critical care end time:  09/02/2021 8:30 PM   Critical care time was exclusive of:  Separately billable procedures and treating other patients   Critical care was necessary to treat or prevent imminent or life-threatening deterioration of the following conditions:  Metabolic crisis, dehydration and sepsis   Critical care was time spent personally by me on the following activities:  Development of treatment plan with patient or surrogate, discussions with consultants, evaluation of patient's response to treatment, examination of patient, ordering and review of laboratory studies, ordering and review of radiographic studies, ordering and performing treatments and interventions, pulse oximetry, re-evaluation of patient's condition and review of old charts   Care discussed with: admitting provider      Medications Ordered in ED Medications  lactated ringers infusion ( Intravenous New Bag/Given 09/02/21 1825)  potassium chloride 10 mEq in 100 mL IVPB (10 mEq Intravenous New Bag/Given 09/02/21 2104)  nystatin (MYCOSTATIN/NYSTOP) topical powder (has no administration in time range)  lactated ringers bolus 1,000 mL (0 mLs Intravenous Stopped 09/02/21 2002)  potassium chloride SA (KLOR-CON M) CR tablet 40 mEq (40 mEq Oral Given 09/02/21 1824)  fentaNYL (SUBLIMAZE) injection 50 mcg (50 mcg Intravenous Given 09/02/21 1824)  iohexol (OMNIPAQUE) 300 MG/ML solution 100 mL (100 mLs Intravenous Contrast Given 09/02/21 1928)  magnesium  sulfate IVPB 2 g 50 mL (0 g Intravenous Stopped 09/02/21 2103)  morphine (PF) 4 MG/ML injection 4 mg (4 mg Intravenous Given 09/02/21 1959)    ED Course/ Medical Decision Making/ A&P                           Medical Decision Making Amount and/or Complexity of Data Reviewed Labs: ordered. Radiology: ordered. ECG/medicine tests: ordered.  Risk Prescription drug management. Decision regarding hospitalization.   This patient presents to the ED for concern of abdominal pain and nausea, this involves an extensive  number of treatment options, and is a complaint that carries with it a high risk of complications and morbidity.  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  Patient will need to replacement ostomy. Supplies ordered.   Work-up was initiated in triage, notable for hypokalemia 2.8 and lactic acid of 2.1.  Given the sepsis work-up was initiated, antibiotics held until further evaluation of source of the infection.    Given  lactated Ringer's, potassium, fentanyl for pain and potassium and magnesium given lab findings..  Suspect decreased oral intake and subsequent dehydration is likely contributing to the hypokalemia and hypomagnesia  On reassessment after fluid resuscitation patient's lactic acid has improved to 1.6.  CT abdomen was ordered given concern for possible intra-abdominal infectious abscess or perforation for pancolitis, CT was  unrevealing but does show increased radiation site surrounding the ostomy.  Patient needs admission for electrolyte replacement observation and additional work-up and observation. Will consult hospitalist for admission.     Lab Tests: -I ordered, reviewed, and interpreted labs.  The pertinent results include: No leukocytosis, stable anemia.  There is hypomagnesia at 1.6 and hypokalemia at 2.8.  Initial lactic elevated at 2.1, repeat trending down to 1.6.  Slight thrombocytosis and elevated APTT likely secondary to underlying alcohol use.    EKG -QT prolongation   Imaging Studies ordered: -I ordered imaging studies including CT abdomen pelvis without any acute process.  Chest x-ray does not show acute process. -I independently visualized and interpreted imaging, and agree with the radiologist interpretation   Consultations Obtained: I requested consultation with the constipation Dr. Linda Hedges,  and discussed lab and imaging findings as well as pertinent plan - they recommend: Admission    Critical Interventions: Patient was critically ill, necessitated fluid resuscitation, potassium and magnesium replacement.    Cardiac Monitoring: The patient was maintained on a cardiac monitor.  I personally viewed and interpreted the cardiac monitored which showed an underlying rhythm of: NSR   Reevaluation: After the interventions noted above, I reevaluated the patient and found that they have :improved   Dispostion: Admit         Final Clinical Impression(s) / ED Diagnoses Final diagnoses:  Hypokalemia  Sepsis, due to unspecified organism, unspecified whether acute organ dysfunction present [facility])    Rx / [facility] Orders ED Discharge Orders     None         Sherrill Raring, PA-C 09/02/21 [place], DO 09/02/21 2238

## 2021-09-02 NOTE — Assessment & Plan Note (Addendum)
Augment diet

## 2021-09-02 NOTE — Assessment & Plan Note (Addendum)
BP stable, cont home meds

## 2021-09-02 NOTE — Subjective & Objective (Signed)
Ms. Donate with h/o necrotizing fasciitis leading to creation of colostomy in 2021. She has had intermittent problems with non-infectious colitis. Last Colonoscopy in 2022 w/o active colitis. She also has h/o gastritis. Recent hospitalization 1/24-08/21/21 for dehydration, hypokalemia and hypomagnesemia 2/2 high volume lossed via colostomy. Also had SBO which resolved without intervention. Since discharge she has continued to have peristomal pain for which she has been taking Motrin, frequent diarrhea and poor po intake. Due to pain and weakness she presented to [facility]ED for evaluation.

## 2021-09-02 NOTE — ED Triage Notes (Signed)
Patient reports that she was discharged from the hospital 5 days ago. Patient states she was hospitalized for abdominal pain/pancolitis Patient states she has an ostomy and the stoma is swollen. Patient states she has a small amount of bleeding around the stoma.  Patient states intermittent nausea, fatigue, and headache.

## 2021-09-02 NOTE — Assessment & Plan Note (Addendum)
Recurrrent problem most likely 2/2 GI losses with chronic diarrhea. Resolved. Add kcl po for dc home

## 2021-09-02 NOTE — Assessment & Plan Note (Addendum)
Stable HR. Cont home meds.

## 2021-09-02 NOTE — Assessment & Plan Note (Addendum)
Recurrent problem 2/2 high volume vluid loss via ostomy and poor PO intake. Received 1 L LR in ED. See 1. Tolerating diet output stable in colostomy

## 2021-09-02 NOTE — Assessment & Plan Note (Signed)
Patient reports abstinence. MIld elevation in LFT. No treatment indicated

## 2021-09-02 NOTE — Assessment & Plan Note (Addendum)
Created in 2021. Was to have follow up with possible take down but her surgeon moved to the [facility] and she was never seen back for f/u per her report. 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 She know to follow up with  [facility] as outpatient

## 2021-09-03 DIAGNOSIS — R197 Diarrhea, unspecified: Secondary | ICD-10-CM | POA: Diagnosis present

## 2021-09-03 LAB — BASIC METABOLIC PANEL
Anion gap: 5 (ref 5–15)
BUN: <5 mg/dL — ABNORMAL LOW (ref 6–20)
CO2: 24 mmol/L (ref 22–32)
Calcium: 8.9 mg/dL (ref 8.9–10.3)
Chloride: 107 mmol/L (ref 98–111)
Creatinine, Ser: 0.48 mg/dL (ref 0.44–1.00)
GFR, Estimated: >60 mL/min (ref >60)
Glucose, Bld: 81 mg/dL (ref 70–99)
Potassium: 4.3 mmol/L (ref 3.5–5.1)
Sodium: 136 mmol/L (ref 135–145)

## 2021-09-03 LAB — MAGNESIUM: Magnesium: 1.6 mg/dL — ABNORMAL LOW (ref 1.7–2.4)

## 2021-09-03 MED ORDER — CHOLESTYRAMINE 4 G PO PACK
4.0000 g | PACK | Freq: Two times a day (BID) | ORAL | Status: DC
  Administered 2021-09-03: 4 g via ORAL
  Filled 2021-09-03 (×2): qty 1

## 2021-09-03 MED ORDER — ENOXAPARIN SODIUM 40 MG/0.4ML IJ SOSY
40.0000 mg | PREFILLED_SYRINGE | INTRAMUSCULAR | Status: DC
  Administered 2021-09-03: 40 mg via SUBCUTANEOUS
  Filled 2021-09-03: qty 0.4

## 2021-09-03 MED ORDER — DIPHENHYDRAMINE HCL 50 MG/ML IJ SOLN
25.0000 mg | Freq: Once | INTRAMUSCULAR | Status: AC
  Administered 2021-09-03: 25 mg via INTRAVENOUS
  Filled 2021-09-03: qty 1

## 2021-09-03 MED ORDER — HYDROXYZINE HCL 25 MG PO TABS
25.0000 mg | ORAL_TABLET | Freq: Three times a day (TID) | ORAL | Status: DC | PRN
  Administered 2021-09-03: 25 mg via ORAL
  Filled 2021-09-03: qty 1

## 2021-09-03 MED ORDER — LACTATED RINGERS IV SOLN: INTRAVENOUS | Status: DC

## 2021-09-03 MED ORDER — MAGNESIUM OXIDE -MG SUPPLEMENT 400 (240 MG) MG PO TABS
400.0000 mg | ORAL_TABLET | Freq: Every day | ORAL | Status: DC
  Administered 2021-09-03: 400 mg via ORAL
  Filled 2021-09-03: qty 1

## 2021-09-03 MED ORDER — HYDROXYZINE HCL 10 MG PO TABS
10.0000 mg | ORAL_TABLET | Freq: Three times a day (TID) | ORAL | Status: DC | PRN
  Administered 2021-09-03: 10 mg via ORAL
  Filled 2021-09-03: qty 1

## 2021-09-03 MED ORDER — CHOLESTYRAMINE 4 G PO PACK: 4.0000 g | PACK | Freq: Two times a day (BID) | ORAL | 0 refills | Status: DC

## 2021-09-03 MED ORDER — LORATADINE 10 MG PO TABS
10.0000 mg | ORAL_TABLET | Freq: Two times a day (BID) | ORAL | Status: DC
  Administered 2021-09-03: 10 mg via ORAL
  Filled 2021-09-03 (×2): qty 1

## 2021-09-03 MED ORDER — HYDROXYZINE HCL 25 MG PO TABS: 25.0000 mg | ORAL_TABLET | Freq: Three times a day (TID) | ORAL | 0 refills | Status: DC | PRN

## 2021-09-03 MED ORDER — MAGNESIUM SULFATE 2 GM/50ML IV SOLN
2.0000 g | Freq: Once | INTRAVENOUS | Status: AC
Start: 2021-09-03 — End: 2021-09-03
  Administered 2021-09-03: 2 g via INTRAVENOUS
  Filled 2021-09-03: qty 50

## 2021-09-03 MED ORDER — PANTOPRAZOLE SODIUM 40 MG PO TBEC: 40.0000 mg | DELAYED_RELEASE_TABLET | Freq: Every day | ORAL | 0 refills | Status: DC

## 2021-09-03 MED ORDER — ACETAMINOPHEN 500 MG PO TABS
500.0000 mg | ORAL_TABLET | Freq: Four times a day (QID) | ORAL | Status: DC
Start: 2021-09-03 — End: 2021-09-03
  Administered 2021-09-03 (×2): 500 mg via ORAL
  Filled 2021-09-03 (×2): qty 1

## 2021-09-03 MED ORDER — MAGNESIUM OXIDE -MG SUPPLEMENT 400 (240 MG) MG PO TABS: 400.0000 mg | ORAL_TABLET | Freq: Every day | ORAL | 0 refills | Status: AC

## 2021-09-03 NOTE — Hospital Course (Addendum)
51yo F w/ h/o necrotizing fasciitis leading to creation of colostomy in 2021. She has had intermittent problems with non-infectious colitis. Last Colonoscopy in 2022 w/o active colitis. She also has h/o gastritis. Recent hospitalization 1/24-08/21/21 for dehydration, hypokalemia and hypomagnesemia 2/2 high volume lossed via colostomy. Also had SBO which resolved without intervention. Since discharge she has continued to have peristomal pain for which she has been taking Motrin, frequent diarrhea and poor po intake. Due to pain and weakness she presented to [facility] for evaluation.  In ED-afebrile, BP stable.Labs- K 2.8,m Mg 1.6, lipase 58 WBC 5.7, Hgb 10.6. Lactic acid 2.1 to 1.6. In ED she received 1L LR, 10 meq K, IV mag. TR[facility] called to admit for continued management of dehydration and electrolyte imbalance. Admitted and given ivf. Lytes stable, tolerated diet and is stable for discharge home.

## 2021-09-03 NOTE — Discharge Summary (Signed)
Physician Discharge Summary   Patient: Alyssa Carey MRN: [id number] DOB: Jan 18, 1971  Admit date:     09/02/2021  Discharge date: 09/03/21  Discharge Physician: Antonieta Pert   PCP: Caryl Bis, MD   Recommendations at discharge:    PCP. GI in 1 wk  Hospital Course: 51yo F w/ h/o necrotizing fasciitis leading to creation of colostomy in 2021. She has had intermittent problems with non-infectious colitis. Last Colonoscopy in 2022 w/o active colitis. She also has h/o gastritis. Recent hospitalization 1/24-08/21/21 for dehydration, hypokalemia and hypomagnesemia 2/2 high volume lossed via colostomy. Also had SBO which resolved without intervention. Since discharge she has continued to have peristomal pain for which she has been taking Motrin, frequent diarrhea and poor po intake. Due to pain and weakness she presented to [facility] for evaluation.  In ED-afebrile, BP stable.Labs- K 2.8,m Mg 1.6, lipase 58 WBC 5.7, Hgb 10.6. Lactic acid 2.1 to 1.6. In ED she received 1L LR, 10 meq K, IV mag. TRH called to admit for continued management of dehydration and electrolyte imbalance. Admitted and given ivf. Lytes stable, tolerated diet and is stable for discharge home.   Discharge Diagnoses: * Diarrhea with dehydration- (present on admission) Resolved. stoll output some- semisolid.Tolerating diet and now stable for dc home, cont questran at [facility] and fu with her GI at [facility]  Dehydration- (present on admission) Recurrent problem 2/2 high volume vluid loss via ostomy and poor PO intake. Received 1 L LR in ED. See 1. Tolerating diet output stable in colostomy   Hypokalemia due to excessive gastrointestinal loss of potassium Recurrrent problem most likely 2/2 GI losses with chronic diarrhea. Resolved. Add kcl po for dc home  Hypomagnesemia- (present on admission) Repleted.   Paroxysmal SVT (supraventricular tachycardia) ([place])- (present on admission) Stable HR. Cont home meds.  PUD (peptic ulcer  disease)- (present on admission) Cont ppi. 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 Was to have follow up with possible take down but her surgeon moved to the [facility] and she was never seen back for f/u per her report. 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 She know to follow up with  GI/[facility] as outpatient  Alcoholic liver disease [facility])- (present on admission) Patient reports abstinence. MIld elevation in LFT. No treatment indicated  Anemia, likely from chronic disease- cbc as op    Consultants: none Procedures performed: none  Disposition: home Diet recommendation:  Diet Orders (From admission, onward)     Start     Ordered   09/03/21 0006  Diet regular Room service appropriate? Yes; Fluid consistency: Thin  Diet effective now       Question Answer Comment  Room service appropriate? Yes   Fluid consistency: Thin      09/03/21 0009             DISCHARGE MEDICATION: Allergies as of 09/03/2021       Reactions   Pantoprazole    Stomach pain   Penicillins Hives   Did it involve swelling of the face/tongue/throat, SOB, or low BP? N Did it involve sudden or severe rash/hives, skin peeling, or any reaction on the inside of your mouth or nose? Y Did you need to seek medical attention at a hospital or doctor's office? N When did it last  happen?  Childhood     If all above answers are "NO", may proceed with cephalosporin use.   Oxycodone Hcl Rash        Medication List     TAKE these medications    acetaminophen 325 MG tablet Commonly known as: TYLENOL Take 325 mg by mouth every 6 (six) hours as needed for mild pain.   BOOST PO Take 1 Bottle by mouth 4 (four) times daily.   cholestyramine 4 g packet Commonly known as: QUESTRAN Take  1 packet (4 g total) by mouth 2 (two) times daily for 10 days.   hydrOXYzine 25 MG tablet Commonly known as: ATARAX Take 1 tablet (25 mg total) by mouth 3 (three) times daily as needed for up to 12 doses for itching.   magnesium oxide 400 (240 Mg) MG tablet Commonly known as: MAG-OX Take 1 tablet (400 mg total) by mouth daily for 5 days. Start taking on: September 04, 2021   Metoprolol Tartrate 37.5 MG Tabs Take 37.5 mg by mouth 2 (two) times daily.   pantoprazole 40 MG tablet Commonly known as: PROTONIX Take 1 tablet (40 mg total) by mouth daily.   psyllium 95 % Pack Commonly known as: HYDROCIL/METAMUCIL Take 1 packet by mouth daily. What changed:  when to take this reasons to take this        Follow-up Information     Caryl Bis, MD Follow up in 1 week(s).   Specialty: Family Medicine Contact information: [address] [place]  [address] [phone]         [facility] Follow up in 1 week(s).   Contact information: [place] [phone]                Discharge Exam: Danley Danker Weights   09/02/21 1514  Weight: 74.8 kg  Condition at discharge: good  The results of significant diagnostics from this hospitalization (including imaging, microbiology, ancillary and laboratory) are listed below for reference.   Imaging Studies: DG Chest 1 View  Result Date: 08/17/2021 CLINICAL DATA:  Nasogastric tube placement. EXAM: CHEST  1 VIEW COMPARISON:  04/21/2021.  Chest CTA dated 08/12/2021. FINDINGS: Very poor depth of inspiration with no gross change in mild elevation of the right hemidiaphragm. Mild bibasilar atelectasis. Interval small, oval nodular density in the lateral aspect of the mid right lung. No nodule at that location on the recent CTA. Normal sized heart. Unremarkable bones. IMPRESSION: 1. Very poor inspiration with mild bibasilar atelectasis. 2. Interval small nodular density in the  lateral aspect of the mid right lung, not present on the CTA 5 days ago. This can be re-evaluated at the time of follow-up radiographs. Electronically Signed   By: Claudie Revering M.D.   On: 08/17/2021 10:51   DG Chest 2 View  Result Date: 08/24/2021 CLINICAL DATA:  Fever. EXAM: CHEST - 2 VIEW COMPARISON:  August 17, 2021. FINDINGS: The heart size and mediastinal contours are within normal limits. Right-sided PICC line is unchanged in position. Mild bibasilar subsegmental atelectasis is noted. The visualized skeletal structures are unremarkable. IMPRESSION: Mild bibasilar subsegmental atelectasis. Electronically Signed   By: Marijo Conception M.D.   On: 08/24/2021 10:05   DG Abd 1 View  Result Date: 08/24/2021 CLINICAL DATA:  Dehydration, vomiting, small-bowel obstruction, necrotizing fasciitis. Small bowel fluoroscopy exam yesterday. 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 EXAM: ABDOMEN - 1 VIEW COMPARISON:  Small-bowel follow-through 08/22/2021 FINDINGS: Residual contrast material is  demonstrated in dilated small bowel with decompressed terminal ileum and contrast seen in the cecum. This is consistent with partial small bowel obstruction. Left lower quadrant ostomy with peristomal hernia suggested. No significant change since prior study. IMPRESSION: Residual contrast material in the small bowel and colon. Changes are consistent with partial small bowel obstruction. Electronically Signed   By: Lucienne Capers M.D.   On: 08/24/2021 01:47   DG Abd 1 View  Result Date: 08/22/2021 CLINICAL DATA:  Small-bowel obstruction, history of colostomy EXAM: ABDOMEN - 1 VIEW COMPARISON:  Portable exam 0710 hours compared to 08/19/2021 FINDINGS: Persistent small bowel dilatation little changed. Paucity of colonic gas. No bowel wall thickening, or urinary tract calcification, or acute osseous findings. IMPRESSION: Persistent small bowel dilatation consistent with obstruction. Electronically Signed   By: Lavonia Dana M.D.   On: 08/22/2021 08:50   DG Abd 1 View  Result Date: 08/18/2021 CLINICAL DATA:  Large parastomal hernia.  Recurrent colitis. EXAM: ABDOMEN - 1 VIEW COMPARISON:  None. FINDINGS: Severe gaseous distension of the stomach. Insert gastric tube with the tip projecting over the stomach. Gaseous distension of the small bowel. No bowel dilatation to suggest obstruction. No evidence of pneumoperitoneum, portal venous gas or pneumatosis. No pathologic calcifications along the expected course of the ureters. No acute osseous abnormality. IMPRESSION: 1. Severe gaseous distension of the stomach with a nasogastric tube in place in the stomach. Gaseous distension of the small bowel. Overall findings concerning for persistent small bowel obstruction. Electronically Signed   By: Kathreen Devoid M.D.   On: 08/18/2021 10:03   DG Abd 1 View  Result Date: 08/17/2021 CLINICAL DATA:  Encounter for NG tube placement. Pt admitted for SBO, c/o SOB. Hx of HTN, pt has colostomy. EXAM: ABDOMEN - 1 VIEW COMPARISON:  CT abdomen pelvis 08/16/2021 FINDINGS: Enteric tube coursing below the hemidiaphragm with tip and side port overlying the gastric lumen. The stomach is dilated with gas. No radio-opaque calculi or other significant radiographic abnormality are seen. IMPRESSION: 1. Enteric tube in good position. 2. Obstructive bowel gas pattern again noted. Electronically Signed   By: Iven Finn M.D.   On: 08/17/2021 23:52   CT Angio Chest Pulmonary Embolism (PE) W or WO Contrast  Result Date: 08/12/2021 CLINICAL DATA:  Tachycardia with elevated D-dimer levels. History of hypertension. Clinical concern for pulmonary embolism. EXAM: CT ANGIOGRAPHY CHEST WITH CONTRAST TECHNIQUE: Multidetector CT imaging of the chest was performed using the standard protocol during bolus administration of intravenous contrast. Multiplanar CT image reconstructions and MIPs were obtained to evaluate the vascular anatomy. RADIATION DOSE REDUCTION: This  exam was performed according to the departmental dose-optimization program which includes automated exposure control, adjustment of the mA and/or kV according to patient size and/or use of iterative reconstruction technique. CONTRAST:  71m OMNIPAQUE IOHEXOL 350 MG/ML SOLN COMPARISON:  Chest CT 01/13/2020. FINDINGS: Cardiovascular: The pulmonary arteries are suboptimally opacified with contrast due to preferential opacification of the aorta and mild breathing artifact. There is no evidence of acute pulmonary embolism. No acute systemic arterial abnormalities are identified. The heart size is stable. There is no pericardial effusion. Mediastinum/Nodes: There are no enlarged mediastinal, hilar or axillary lymph nodes. The thyroid gland, trachea and esophagus demonstrate no significant findings. Lungs/Pleura: The previously demonstrated bilateral pleural effusions have resolved, and there is improved aeration of both lungs. There is mild residual dependent atelectasis in both lungs. No airspace disease or suspicious pulmonary nodularity identified. Upper abdomen: The liver demonstrates diffusely decreased density consistent  with steatosis. No focal abnormality identified. No acute findings are seen within the visualized upper abdomen. Musculoskeletal/Chest wall: There is no chest wall mass or suspicious osseous finding. Generalized soft tissue edema present previously has improved. Review of the MIP images confirms the above findings. IMPRESSION: 1. No evidence of acute pulmonary embolism or other acute findings within the chest. 2. Interval resolution of previously demonstrated pleural effusions with improved atelectasis in both lungs. 3. Hepatic steatosis. Electronically Signed   By: Richardean Sale M.D.   On: 08/12/2021 14:23   CT Abdomen Pelvis W Contrast  Result Date: 09/02/2021 CLINICAL DATA:  History of abdominal pain and pancolitis with ostomy and swollen stoma site. EXAM: CT ABDOMEN AND PELVIS WITH  CONTRAST TECHNIQUE: Multidetector CT imaging of the abdomen and pelvis was performed using the standard protocol following bolus administration of intravenous contrast. RADIATION DOSE REDUCTION: This exam was performed according to the departmental dose-optimization program which includes automated exposure control, adjustment of the mA and/or kV according to patient size and/or use of iterative reconstruction technique. CONTRAST:  156m OMNIPAQUE IOHEXOL 300 MG/ML  SOLN COMPARISON:  August 16, 2021 FINDINGS: Lower chest: Moderate severity scarring, atelectasis and/or infiltrate is seen within the posterior aspect of the right lung base. This is decreased in severity when compared to the prior study. Hepatobiliary: No focal liver abnormality is seen. Tiny gallstones are seen within the lumen of an otherwise normal-appearing gallbladder. Pancreas: Unremarkable. No pancreatic ductal dilatation or surrounding inflammatory changes. Spleen: Normal in size without focal abnormality. Adrenals/Urinary Tract: Adrenal glands are unremarkable. Kidneys are normal, without renal calculi, focal lesion, or hydronephrosis. The urinary bladder is partially contracted with mild diffuse urinary bladder wall thickening. Stomach/Bowel: Stomach is within normal limits. The appendix is not clearly identified. Barium is seen within the expected region of the distal ileum, with contrast also noted throughout the ascending, transverse and proximal descending colon. There is no evidence of bowel dilatation. A left lower quadrant ostomy site is noted. There is a 19.5 cm x 12.9 cm associated parastomal hernia. Vascular/Lymphatic: No significant vascular findings are present. No enlarged abdominal or pelvic lymph nodes. Reproductive: The uterus and bilateral adnexa are limited in evaluation secondary to streak artifact from adjacent barium. Other: No abdominopelvic ascites. Musculoskeletal: No acute or significant osseous findings. IMPRESSION:  1. Cholelithiasis. 2. Left lower quadrant ostomy site with a large parastomal hernia, mildly increased in size when compared to the prior exam. 3. Moderate severity right basilar scarring, atelectasis and/or infiltrate, decreased in severity when compared to the prior study. Electronically Signed   By: TVirgina NorfolkM.D.   On: 09/02/2021 20:05   CT ABDOMEN PELVIS W CONTRAST  Result Date: 08/16/2021 CLINICAL DATA:  Abdominal pain EXAM: CT ABDOMEN AND PELVIS WITH CONTRAST TECHNIQUE: Multidetector CT imaging of the abdomen and pelvis was performed using the standard protocol following bolus administration of intravenous contrast. RADIATION DOSE REDUCTION: This exam was performed according to the departmental dose-optimization program which includes automated exposure control, adjustment of the mA and/or kV according to patient size and/or use of iterative reconstruction technique. CONTRAST:  1059mOMNIPAQUE IOHEXOL 300 MG/ML  SOLN COMPARISON:  08/10/2021 FINDINGS: Lower chest: There are small patchy infiltrates in both lower lobes with interval worsening. Hepatobiliary: There is fatty infiltration in the liver. Gallbladder is unremarkable. Pancreas: There is pancreatic atrophy. No focal abnormality is seen. Spleen: Unremarkable. Adrenals/Urinary Tract: Adrenals are not enlarged. There is no hydronephrosis. There are no renal or ureteral stones. Urinary bladder is not distended.  Stomach/Bowel: There is marked distention of stomach. There is fluid in the lumen of visualized lower thoracic esophagus, possibly suggesting reflux. There is abnormal dilation of small-bowel loops. Small bowel loops measure up to 4.8 cm. There is a ventral hernia containing portions of small bowel loops and colon in the left lower quadrant. There is transition in the diameter of small-bowel loops within the hernia. There is mild diffuse wall thickening in the visualized portions of colon. There is liquid stool in the colon. There is  decompression of sigmoid colon and rectum. Vascular/Lymphatic: Unremarkable. Reproductive: Other: There is no ascites or pneumoperitoneum. There is edema in subcutaneous plane in the abdominal wall, more so on the left side without loculated fluid collections. Musculoskeletal: Unremarkable. IMPRESSION: There is interval marked distention of stomach with fluid. There is abnormal dilation of proximal small bowel loops measuring up to 4.8 cm in diameter suggesting high-grade partial obstruction. Zone of transition in the small bowel loops appears to be within a ventral hernia in the left lower quadrant. Partly decompressed distal small bowel loops are seen within the hernial sac in the left lower quadrant. There is wall thickening along with fluid in the lumen in the ascending, transverse and descending colon suggesting colitis. Wall thickening in the colon appears less prominent. There is decompression of sigmoid colon. Findings suggest colitis and partial obstruction of left colon within the hernial sac in the left lower quadrant. There is no hydronephrosis. There are patchy infiltrates in both lower lung fields suggesting atelectasis/pneumonia. There is fluid in the lumen of thoracic esophagus suggesting gastroesophageal reflux. Fatty liver. There is edema in subcutaneous plane, possibly suggesting anasarca. There are no loculated fluid collections in the abdominal wall. Other findings as described in the body of the report. Electronically Signed   By: Elmer Picker M.D.   On: 08/16/2021 12:45   CT ABDOMEN PELVIS W CONTRAST  Result Date: 08/10/2021 CLINICAL DATA:  Nausea vomiting. EXAM: CT ABDOMEN AND PELVIS WITH CONTRAST TECHNIQUE: Multidetector CT imaging of the abdomen and pelvis was performed using the standard protocol following bolus administration of intravenous contrast. RADIATION DOSE REDUCTION: This exam was performed according to the departmental dose-optimization program which includes  automated exposure control, adjustment of the mA and/or kV according to patient size and/or use of iterative reconstruction technique. CONTRAST:  14m OMNIPAQUE IOHEXOL 300 MG/ML  SOLN COMPARISON:  CT abdomen pelvis dated 04/09/2021. FINDINGS: Lower chest: The visualized lung bases are clear. No intra-abdominal free air or free fluid. Hepatobiliary: Diffuse fatty liver. No intrahepatic biliary dilatation. Small gallstone. No pericholecystic fluid or evidence of acute cholecystitis by CT. Pancreas: Unremarkable. No pancreatic ductal dilatation or surrounding inflammatory changes. Spleen: Normal in size without focal abnormality. Adrenals/Urinary Tract: The adrenal glands unremarkable. There is no hydronephrosis on either side. There is symmetric enhancement and excretion of contrast by both kidneys. The visualized ureters and urinary bladder appear unremarkable. 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 There is diffuse inflammatory changes and thickening of the colon consistent with colitis. There is no bowel obstruction. The appendix is normal. Vascular/Lymphatic: The abdominal aorta and IVC are unremarkable. No portal venous gas. Small scattered mesenteric lymph nodes. No adenopathy. Reproductive: The uterus is anteverted.  No adnexal masses. Other: Anterior pelvic wall incisional scar. Musculoskeletal: No acute osseous pathology. IMPRESSION: 1. Pancolitis. No bowel obstruction. Normal appendix. 2. Fatty liver. 3. Cholelithiasis. Electronically Signed   By: AAnner CreteM.D.   On: 08/10/2021 02:25   [place]  Abdomen Limited  Result Date: 08/10/2021 CLINICAL DATA:  Evaluate for abscess EXAM: ULTRASOUND ABDOMEN LIMITED COMPARISON:  None. FINDINGS: Limited ultrasound of the area around the stoma demonstrates normal appearing bowel loops with no evidence of organized fluid collection. IMPRESSION: No evidence abscess. Electronically Signed   By: Yetta Glassman  M.D.   On: 08/10/2021 14:34   US Venous Img Upper Uni Right(DVT)  Result Date: 08/17/2021 CLINICAL DATA:  51 year old female with elevated D-dimer, concern for right upper extremity deep vein thrombosis. EXAM: RIGHT UPPER EXTREMITY VENOUS DOPPLER ULTRASOUND TECHNIQUE: Gray-scale sonography with graded compression, as well as color Doppler and duplex ultrasound were performed to evaluate the upper extremity deep venous system from the level of the subclavian vein and including the jugular, axillary, basilic, radial, ulnar and upper cephalic vein. Spectral Doppler was utilized to evaluate flow at rest and with distal augmentation maneuvers. COMPARISON:  None. FINDINGS: Contralateral Subclavian Vein: Respiratory phasicity is normal and symmetric with the symptomatic side. No evidence of thrombus. Normal compressibility. Internal Jugular Vein: No evidence of thrombus. Normal compressibility, respiratory phasicity and response to augmentation. Subclavian Vein: No evidence of thrombus. Normal compressibility, respiratory phasicity and response to augmentation. Axillary Vein: No evidence of thrombus. Normal compressibility, respiratory phasicity and response to augmentation. Cephalic Vein: No evidence of thrombus. Normal compressibility, respiratory phasicity and response to augmentation. Basilic Vein: No evidence of thrombus. Normal compressibility, respiratory phasicity and response to augmentation. Brachial Veins: No evidence of thrombus. Normal compressibility, respiratory phasicity and response to augmentation. Radial Veins: No evidence of thrombus. Normal compressibility, respiratory phasicity and response to augmentation. Ulnar Veins: No evidence of thrombus. Normal compressibility, respiratory phasicity and response to augmentation. Other Findings:  None visualized. IMPRESSION: No evidence of deep vein thrombosis within the right upper extremity. Ruthann Cancer, MD [facility]  [facility] Electronically Signed   By: Ruthann Cancer M.D.   On: 08/17/2021 07:38   DG Chest Portable 1 View  Result Date: 09/02/2021 CLINICAL DATA:  Shortness of breath EXAM: PORTABLE CHEST 1 VIEW COMPARISON:  08/24/2021 FINDINGS: Cardiac shadow is stable. Right-sided PICC has been removed in the interval. Elevation the right hemidiaphragm is again seen. Minimal right basilar atelectasis is again noted. No new focal infiltrate is seen. No bony abnormality is noted. IMPRESSION: Stable right basilar atelectasis. Electronically Signed   By: Inez Catalina M.D.   On: 09/02/2021 19:16   DG CHEST PORT 1 VIEW  Result Date: 08/17/2021 CLINICAL DATA:  NG tube placement. EXAM: PORTABLE CHEST 1 VIEW COMPARISON:  Chest x-ray 08/17/2021. FINDINGS: Enteric tube tip extends into the stomach, distal tip is not included, but is likely beyond the mid stomach. There is gastric dilatation as seen on prior CT. There is left basilar atelectasis. Right-sided central venous catheter tip projects over the distal SVC. Cardiomediastinal silhouette is unchanged, the heart is enlarged. There is no pneumothorax or acute fracture. IMPRESSION: 1. NG tube reaches the mid stomach. The distal tip is not included on this image. 2. Stable dilated stomach. Electronically Signed   By: Ronney Asters M.D.   On: 08/17/2021 23:44   DG ABD ACUTE 2+V W 1V CHEST  Result Date: 08/26/2021 CLINICAL DATA:  Small bowel obstruction.  Emesis. EXAM: DG ABDOMEN ACUTE WITH 1 VIEW CHEST COMPARISON:  Chest and abdominal x-rays dated August 24, 2021. FINDINGS: Small bowel dilatation has improved, although there is a single remaining dilated small bowel loop in the central abdomen measuring up to 4.3 cm in diameter. Oral contrast  throughout the colon and in the left lower quadrant ostomy bag. 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 No pneumoperitoneum. No radiopaque calculi or other significant radiographic abnormality is seen. Unchanged  right upper extremity PICC line. Stable cardiomediastinal silhouette with normal heart size. Continued low lung volumes with right greater than left basilar atelectasis. No focal consolidation, pleural effusion, or pneumothorax. IMPRESSION: 1. Improved partial small bowel obstruction. 2.  No active cardiopulmonary disease. Electronically Signed   By: Titus Dubin M.D.   On: 08/26/2021 07:55   DG Abd Portable 1V  Result Date: 08/19/2021 CLINICAL DATA:  OG tube placement EXAM: PORTABLE ABDOMEN - 1 VIEW COMPARISON:  Plain film of the abdomen dated 08/18/2021. CT abdomen dated 08/16/2021. FINDINGS: The enteric tube is no longer visualized, presumably retracted to the level of the upper chest or beyond. Continued evidence of small-bowel obstruction with dilated gas-filled small bowel loops throughout the central abdomen. No evidence of free intraperitoneal air. IMPRESSION: 1. The enteric tube is no longer visualized, presumably retracted to the level of the upper chest or beyond the upper chest. Recommend repositioning into the stomach. 2. Continued evidence of small-bowel obstruction. Electronically Signed   By: Franki Cabot M.D.   On: 08/19/2021 13:08   ECHOCARDIOGRAM LIMITED  Result Date: 08/15/2021    ECHOCARDIOGRAM LIMITED REPORT   Patient Name:   Dyesha DAROTHY COURTRIGHT Date of Exam: 08/14/2021 Medical Rec #:  [id number]       Height:       58.0 in Accession #:    [id number]      Weight:       187.4 lb Date of Birth:  1971/01/23       BSA:          1.771 m Patient Age:    4 years        BP:           132/91 mmHg Patient Gender: F               HR:           124 bpm. Exam Location:  Inpatient Procedure: Limited Echo, Limited Color Doppler and Cardiac Doppler Indications:     abnormal ecg  History:         Patient has prior history of Echocardiogram examinations, most                  recent 04/22/2021. Risk Factors:Hypertension.  Sonographer:     Johny Chess RDCS Referring Phys:  [id number] [place]  Diagnosing Phys: Gwyndolyn Kaufman MD IMPRESSIONS  1. Left ventricular ejection fraction, by estimation, is 50 to 55%. The left ventricle has low normal function. The left ventricle has no regional wall motion abnormalities.  2. The right ventricular size is normal. There is normal pulmonary artery systolic pressure. The estimated right ventricular systolic pressure is 24.5 mmHg.  3. The mitral valve is grossly normal. Trivial mitral valve regurgitation.  4. The aortic valve was not well visualized. Aortic valve regurgitation is not visualized. No aortic stenosis is present.  5. The inferior vena cava is normal in size with greater than 50% respiratory variability, suggesting right atrial pressure of 3 mmHg.  6. Tachycardiac throughout study. Comparison(s): Compared to prior TTE in 04/2021, the EF appears slightly lower at ~55% (previously 60-65%) but patient is notably more tachycardic during current study. FINDINGS  Left Ventricle: Left ventricular ejection fraction, by estimation, is 50 to 55%. The left ventricle has low normal function.  The left ventricle has no regional wall motion abnormalities. The left ventricular internal cavity size was normal in size. There is no left ventricular hypertrophy. Right Ventricle: The right ventricular size is normal. There is normal pulmonary artery systolic pressure. The tricuspid regurgitant velocity is 2.59 m/s, and with an assumed right atrial pressure of 3 mmHg, the estimated right ventricular systolic pressure is 48.1 mmHg. Left Atrium: Left atrial size was normal in size. Right Atrium: Right atrial size was normal in size. Pericardium: There is no evidence of pericardial effusion. Mitral Valve: The mitral valve is grossly normal. There is mild thickening of the mitral valve leaflet(s). There is mild calcification of the mitral valve leaflet(s). Trivial mitral valve regurgitation. Tricuspid Valve: The tricuspid valve is normal in structure. Tricuspid valve regurgitation  is mild. Aortic Valve: The aortic valve was not well visualized. Aortic valve regurgitation is not visualized. No aortic stenosis is present. Pulmonic Valve: The pulmonic valve was normal in structure. Pulmonic valve regurgitation is trivial. Venous: The inferior vena cava is normal in size with greater than 50% respiratory variability, suggesting right atrial pressure of 3 mmHg. IAS/Shunts: The atrial septum is grossly normal. LEFT VENTRICLE PLAX 2D LVIDd:         5.10 cm LVIDs:         3.60 cm  LV Volumes (MOD) LV vol d, MOD A4C: 81.9 ml LV vol s, MOD A4C: 45.7 ml LV SV MOD A4C:     81.9 ml AORTIC VALVE LVOT Vmax:   93.30 cm/s LVOT Vmean:  58.000 cm/s LVOT VTI:    0.110 m TRICUSPID VALVE TR Peak grad:   26.8 mmHg TR Vmax:        259.00 cm/s  SHUNTS Systemic VTI: 0.11 m Gwyndolyn Kaufman MD Electronically signed by Gwyndolyn Kaufman MD Signature Date/Time: 08/15/2021/10:07:59 AM    Final (Updated)    [place] EKG SITE RITE  Result Date: 08/17/2021 If Site Rite image not attached, placement could not be confirmed due to current cardiac rhythm.  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 Images obtained: 7, over 16 hours FINDINGS: Poor emptying of tracer from the stomach, potentially related to patient being supine for prolonged periods. Proximal small bowel loops normal appearance. Dilated small bowel loops are seen in the hernia sac adjacent to the colostomy. However contrast passes beyond these into distal small bowel. Several of the small bowel loops immediately proximal to the hernia sac are significantly dilated however the course of the exam.  However contrast passes beyond the parastomal herniation and into the RIGHT colon, which is decompressed. Findings are consistent with a partial small bowel obstruction at the level of the parastomal hernia. IMPRESSION: Partial small bowel obstruction at the level of the parastomal hernia, with several dilated loops proximal to the hernia as well as within the hernia sac, though contrast is able to pass beyond into nondistended distal small bowel loops and on to the RIGHT colon. Electronically Signed   By: Lavonia Dana M.D.   On: 08/23/2021 09:11    Microbiology: Results for orders placed or performed during the hospital encounter of 09/02/21  Resp Panel by RT-PCR (Flu A&B, Covid) Nasopharyngeal Swab  Status: None   Collection Time: 09/02/21  6:00 PM   Specimen: Nasopharyngeal Swab; Nasopharyngeal(NP) swabs in vial transport medium  Result Value Ref Range Status   SARS Coronavirus 2 by RT PCR NEGATIVE NEGATIVE Final    Comment: (NOTE) SARS-CoV-2 target nucleic acids are NOT DETECTED.  The SARS-CoV-2 RNA is generally detectable in upper respiratory specimens during the acute phase of infection. The lowest concentration of SARS-CoV-2 viral copies this assay can detect is 138 copies/mL. A negative result does not preclude SARS-Cov-2 infection and should not be used as the sole basis for treatment or other patient management decisions. A negative result may occur with  improper specimen collection/handling, submission of specimen other than nasopharyngeal swab, presence of viral mutation(s) within the areas targeted by this assay, and inadequate number of viral copies(<138 copies/mL). A negative result must be combined with clinical observations, patient history, and epidemiological information. The expected result is Negative.  Fact Sheet for Patients:  EntrepreneurPulse.com.au  Fact Sheet for Healthcare Providers:  IncredibleEmployment.be  This test  is no t yet approved or cleared by the [place] FDA and  has been authorized for detection and/or diagnosis of SARS-CoV-2 by FDA under an Emergency Use Authorization (EUA). This EUA will remain  in effect (meaning this test can be used) for the duration of the COVID-19 declaration under Section 564(b)(1) of the Act, 21 U.S.C.section 360bbb-3(b)(1), unless the authorization is terminated  or revoked sooner.       Influenza A by PCR NEGATIVE NEGATIVE Final   Influenza B by PCR NEGATIVE NEGATIVE Final    Comment: (NOTE) The Xpert Xpress SARS-CoV-2/FLU/RSV plus assay is intended as an aid in the diagnosis of influenza from Nasopharyngeal swab specimens and should not be used as a sole basis for treatment. Nasal washings and aspirates are unacceptable for Xpert Xpress SARS-CoV-2/FLU/RSV testing.  Fact Sheet for Patients: EntrepreneurPulse.com.au  Fact Sheet for Healthcare Providers: IncredibleEmployment.be  This test is not yet approved or cleared by the [place] FDA and has been authorized for detection and/or diagnosis of SARS-CoV-2 by FDA under an Emergency Use Authorization (EUA). This EUA will remain in effect (meaning this test can be used) for the duration of the COVID-19 declaration under Section 564(b)(1) of the Act, 21 U.S.C. section 360bbb-3(b)(1), unless the authorization is terminated or revoked.  Performed at [facility], [place] [address] [place], [place] [address]   Blood Culture (routine x 2)     Status: None (Preliminary result)   Collection Time: 09/02/21  6:20 PM   Specimen: BLOOD  Result Value Ref Range Status   Specimen Description   Final    BLOOD RIGHT ANTECUBITAL Performed at [place] [address] [place], [place] [address]    Special Requests   Final    BOTTLES DRAWN AEROBIC AND ANAEROBIC Blood Culture results may not be optimal due to an inadequate volume of  blood received in culture bottles Performed at [place] [place] [address], [place], [place] [address]    Culture   Final    NO GROWTH < 12 HOURS Performed at [place] [address] [place], [place] [address]    Report Status PENDING  Incomplete    Labs: CBC: Recent Labs  Lab 09/02/21 1551  WBC 5.7  NEUTROABS 3.3  HGB 10.6*  HCT 34.1*  MCV 99.1  PLT 426*   Basic Metabolic Panel: Recent Labs  Lab 09/02/21 1551 09/02/21 1820 09/03/21 0725  NA  138  --  136  K 2.8*  --  4.3  CL 106  --  107  CO2 26  --  24  GLUCOSE 108*  --  81  BUN 7  --  <5*  CREATININE 0.53  --  0.48  CALCIUM 9.1  --  8.9  MG  --  1.6* 1.6*   Liver Function Tests: Recent Labs  Lab 09/02/21 1551  AST 42*  ALT 23  ALKPHOS 122  BILITOT 0.6  PROT 8.4*  ALBUMIN 3.2*   CBG: No results for input(s): GLUCAP in the last 168 hours.  Discharge time spent: 25 minutes.  Signed: Antonieta Pert, MD [facility] 09/03/2021

## 2021-09-03 NOTE — Progress Notes (Signed)
Admission not done, pt is for DC now.

## 2021-09-03 NOTE — Assessment & Plan Note (Signed)
Resolved. stoll output some- semisolid.Tolerating diet and now stable for dc home, cont questran at [facility] and fu with her GI at [facility]

## 2021-09-04 LAB — URINE CULTURE

## 2021-09-07 LAB — CULTURE, BLOOD (ROUTINE X 2): Culture: NO GROWTH

## 2021-11-22 ENCOUNTER — Telehealth: Payer: Self-pay

## 2021-11-22 NOTE — Telephone Encounter (Signed)
Called to follow up with [facility] client who is need of colostomy supplies. ?No answer, left voicemail requesting return call. ? ?Have made multiple attempts to reach client by phone and left voicemails.  ? ?Plan: will continue to attempt to reach to share resources for colostomy supplies. ? ?Debria Garret RN ?[employer] ?

## 2021-11-22 NOTE — Telephone Encounter (Signed)
[facility] client returned call. Discussed colostomy supplies. Client has been unable to afford them. She states she has a few left. ?CM contacted [employer] and she has some colostomy supplies she will bring this week. ?Also texted with her permission contact information for patient assistance to  ?[employer] patient assistance programs.  ? ?Plan: will notify client when donated ostomy supplies arrive. She states she has transportation to get them. ? ? ?Debria Garret RN ?[employer] ?

## 2021-12-08 ENCOUNTER — Encounter: Payer: Self-pay | Admitting: Internal Medicine

## 2021-12-08 ENCOUNTER — Ambulatory Visit (INDEPENDENT_AMBULATORY_CARE_PROVIDER_SITE_OTHER): Payer: Self-pay | Admitting: Internal Medicine

## 2021-12-08 ENCOUNTER — Telehealth: Payer: Self-pay

## 2021-12-08 VITALS — BP 114/90 | HR 80 | Temp 97.3°F | Ht <= 58 in | Wt 165.4 lb

## 2021-12-08 DIAGNOSIS — R197 Diarrhea, unspecified: Secondary | ICD-10-CM

## 2021-12-08 DIAGNOSIS — Z933 Colostomy status: Secondary | ICD-10-CM

## 2021-12-08 DIAGNOSIS — Z8719 Personal history of other diseases of the digestive system: Secondary | ICD-10-CM

## 2021-12-08 DIAGNOSIS — K219 Gastro-esophageal reflux disease without esophagitis: Secondary | ICD-10-CM

## 2021-12-08 MED ORDER — HYDROXYZINE HCL 25 MG PO TABS: 25.0000 mg | ORAL_TABLET | Freq: Three times a day (TID) | ORAL | 2 refills | Status: AC | PRN

## 2021-12-08 NOTE — Patient Instructions (Addendum)
I would recommend that you contact [facility] and schedule follow-up appointment soon as possible with Dr. Marcello Moores.  I will refill your hydroxyzine today.  This will be a one-time prescription.  You will need to have this refilled by your primary care physician going forward.  It was very nice seeing you again today.  Follow-up with GI in 6 months.  Dr. Robina Ade

## 2021-12-08 NOTE — Telephone Encounter (Signed)
Attempted to return call to client. No answer, left voicemail requesting a return call.   Alyssa Carey

## 2021-12-08 NOTE — Progress Notes (Signed)
Referring Provider: Caryl Bis, MD Primary Care Physician:  Caryl Bis, MD Primary GI:  Dr. Abbey Chatters  Chief Complaint  Patient presents with   Follow-up    Check on her ostectomy bag    HPI:   Alyssa Carey is a 51 y.o. female who presents to clinic today for follow-up visit.  She has a history of necrotizing fasciitis s/p diverting colostomy June 1517 complicated by parastomal hernia, small bowel obstruction January/February 2023, previous GI bleed due to peptic ulcer disease.    Admitted to [facility] January 2023 due to high ostomy output and abdominal pain.  CT imaging showed pancolitis as well as parastomal hernia.  Patient received ciprofloxacin and Flagyl empirically and her colitis resolved.  GI pathogen panel and C. difficile panel negative.  Subsequently developed small bowel obstruction, improved with conservative measures.  Repeat CT imaging 09/02/2021 showed no further colitis or small bowel obstruction.  Has been on Questran to help with chronic diarrhea which is helped.  She saw Dr. Marcello Moores August 2022 [facility] to discuss colostomy takedown.  At that time patient was losing weight, poor appetite.  Since then she has gained nearly 20 pounds.  States her appetite is good.  She is quite adamant that she wants to have her colostomy fixed.  She is frustrated in clinic today.  States she wants to get back to work and her colostomy is preventing this.    Also states that she is confined to her house, only goes to church and to the grocery store.  Past Medical History:  Diagnosis Date   HTN (hypertension) 10/05/2020   Necrotizing fasciitis [facility])     Past Surgical History:  Procedure Laterality Date   BIOPSY  10/07/2020   Procedure: BIOPSY;  Surgeon: Daneil Dolin, MD;  Location: [facility];  Service: Endoscopy;;   COLONOSCOPY WITH PROPOFOL N/A 10/07/2020   single healing rectal ulcer in distal rectum with surrounding mucosal friability and  markedly abnormal proximal colon with ulceration s/p biopsies.   COLONOSCOPY WITH PROPOFOL  02/22/2021   Procedure: COLONOSCOPY WITH PROPOFOL;  Surgeon: [name], DO;  [facility];  Service: Endoscopy;;   ESOPHAGOGASTRODUODENOSCOPY (EGD) WITH PROPOFOL N/A 10/07/2020   non-bleeding gastric ulcer . Pathology with H.pylori negative   ESOPHAGOGASTRODUODENOSCOPY (EGD) WITH PROPOFOL N/A 12/27/2020   Procedure: ESOPHAGOGASTRODUODENOSCOPY (EGD) WITH PROPOFOL;  Surgeon: [name], DO;  Location: [facility];  Service: Endoscopy;  Laterality: N/A;  1:00pm   FLEXIBLE SIGMOIDOSCOPY N/A 01/11/2020   Procedure: FLEXIBLE SIGMOIDOSCOPY;  Surgeon: Carol Ada, MD;  Location: [facility];  Service: Gastroenterology;  Laterality: N/A;   INCISION AND DRAINAGE PERIRECTAL ABSCESS N/A 12/25/2019   Procedure: IRRIGATION AND DEBRIDEMENT BUTTOCKS, LAP LOOP COLOSTOMY;  Surgeon: Greer Pickerel, MD;  Location: [facility] ORS;  Service: General;  Laterality: N/A;   IRRIGATION AND DEBRIDEMENT ABSCESS N/A 12/23/2019   Procedure: EXCISION AND DEBRIDEMENT LEFT BUTTOCK AND PERINEUM;  Surgeon: Clovis Riley, MD;  Location: [facility];  Service: General;  Laterality: N/A;   LAPAROSCOPIC LOOP COLOSTOMY N/A 12/25/2019   Procedure: LAPAROSCOPIC LOOP COLOSTOMY;  Surgeon: Greer Pickerel, MD;  Location: [facility];  Service: General;  Laterality: N/A;    Current Outpatient Medications  Medication Sig Dispense Refill   acetaminophen (TYLENOL) 325 MG tablet Take 325 mg by mouth every 6 (six) hours as needed for mild pain.     Nutritional Supplements (BOOST PO) Take 1 Bottle by mouth 4 (four) times  daily.     psyllium (HYDROCIL/METAMUCIL) 95 % PACK Take 1 packet by mouth daily. (Patient taking differently: Take 1 packet by mouth daily as needed for mild constipation.) 240 each    cholestyramine (QUESTRAN) 4 g packet Take 1 packet (4 g total) by mouth 2 (two) times daily for 10 days. 20 packet 0   hydrOXYzine (ATARAX) 25 MG tablet  Take 1 tablet (25 mg total) by mouth 3 (three) times daily as needed for itching. 30 tablet 2   metoprolol tartrate 37.5 MG TABS Take 37.5 mg by mouth 2 (two) times daily. (Patient not taking: Reported on 12/08/2021) 90 tablet 1   pantoprazole (PROTONIX) 40 MG tablet Take 1 tablet (40 mg total) by mouth daily. 30 tablet 0   No current facility-administered medications for this visit.    Allergies as of 12/08/2021 - Review Complete 12/08/2021  Allergen Reaction Noted   Pantoprazole  02/17/2021   Penicillins Hives 12/21/2019   Oxycodone hcl Rash 10/06/2020    Family History  Problem Relation Age of Onset   Colon polyps Mother        does not believe adenomas   Arrhythmia Mother    Colon cancer Neg Hx     Social History   Socioeconomic History   Marital status: Single    Spouse name: Not on file   Number of children: 1   Years of education: Not on file   Highest education level: Not on file  Occupational History   Occupation: unknown  Tobacco Use   Smoking status: Never   Smokeless tobacco: Never  Vaping Use   Vaping Use: Never used  Substance and Sexual Activity   Alcohol use: Not Currently   Drug use: Not Currently   Sexual activity: Not Currently  Other Topics Concern   Not on file  Social History Narrative   Patient found in hotel room; unsure of living situation prior.   Social Determinants of Health   Financial Resource Strain: Not on file  Food Insecurity: Not on file  Transportation Needs: Not on file  Physical Activity: Not on file  Stress: Not on file  Social Connections: Not on file    Subjective: Review of Systems  Constitutional:  Negative for chills and fever.  HENT:  Negative for congestion and hearing loss.   Eyes:  Negative for blurred vision and double vision.  Respiratory:  Negative for cough and shortness of breath.   Cardiovascular:  Negative for chest pain and palpitations.  Gastrointestinal:  Negative for abdominal pain, blood in  stool, constipation, diarrhea, heartburn, melena and vomiting.  Genitourinary:  Negative for dysuria and urgency.  Musculoskeletal:  Negative for joint pain and myalgias.  Skin:  Negative for itching and rash.  Neurological:  Negative for dizziness and headaches.  Psychiatric/Behavioral:  Negative for depression. The patient is not nervous/anxious.     Objective: BP 114/90   Pulse 80   Temp (!) 97.3 F (36.3 C)   Ht 4' 9"  (1.448 m)   Wt 165 lb 6.4 oz (75 kg)   BMI 35.79 kg/m  Physical Exam Constitutional:      Appearance: Normal appearance.  HENT:     Head: Normocephalic and atraumatic.  Eyes:     Extraocular Movements: Extraocular movements intact.     Conjunctiva/sclera: Conjunctivae normal.  Cardiovascular:     Rate and Rhythm: Normal rate and regular rhythm.  Pulmonary:     Effort: Pulmonary effort is normal.     Breath sounds: Normal  breath sounds.  Abdominal:     General: Bowel sounds are normal.     Palpations: Abdomen is soft.  Musculoskeletal:        General: No swelling. Normal range of motion.     Cervical back: Normal range of motion and neck supple.  Skin:    General: Skin is warm and dry.     Coloration: Skin is not jaundiced.  Neurological:     General: No focal deficit present.     Mental Status: She is alert and oriented to person, place, and time.  Psychiatric:        Mood and Affect: Mood normal.        Behavior: Behavior normal.     Assessment: *Necrotizing fasciitis status post diverting colostomy *Colitis-resolved *Small bowel obstruction-resolved *Chronic GERD *Diarrhea-chronic improved on Questran  Plan: Patient wishes to pursue colostomy takedown.  Recommend she follow back up with her general surgeon Dr. Marcello Moores to further discuss.  She has gained nearly 20 pounds.  Notes good appetite.  Diarrhea resolved.  Currently taking Questran.  We will continue.  Most recent CT scan showed no further colitis.  Chronic GERD well-controlled.   Continue to monitor.  Tentatively plan on colonoscopy 1 year after colostomy takedown.   12/08/2021 12:48 PM   Disclaimer: This note was dictated with voice recognition software. Similar sounding words can inadvertently be transcribed and may not be corrected upon review.

## 2021-12-13 ENCOUNTER — Ambulatory Visit: Payer: Self-pay | Admitting: General Surgery

## 2021-12-13 NOTE — H&P (Signed)
PROVIDER:  Monico Blitz, MD  MRN: [id number] DOB: Jan 12, 1971 DATE OF ENCOUNTER: 12/13/2021  Subjective   Chief Complaint: No chief complaint on file.     History of Present Illness: Alyssa Carey is a 51 y.o. female who is seen today as an office consultation at the request of Dr. Pasty Arch for evaluation of No chief complaint on file. .  51 year old female who has a history of necrotizing fasciitis status supposed diverting colostomy in June 2021. Patient has a remote history of a rectal ulcer that appeared healed on colonoscopy by her gastroenterologist in August.  I saw her last in August of last year.  She was losing weight and had no appetite.  I recommended that she follow-up with gastroenterology.  She has been seeing her gastroenterologist and her symptoms have resolved.  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  Patient has a recent history of pancolitis as well as parastomal hernia.  She empirically received Cipro and Flagyl and her colitis resolved.  C. difficile was negative.  She developed a small bowel obstruction after that, which improved with conservative measures.  Follow-up CT scan February 2023 shows no further colitis or obstructive symptoms.  Has been on Questran to help with chronic diarrhea.     Review of Systems: A complete review of systems was obtained from the patient.  I have reviewed this information and discussed as appropriate with the patient.  See HPI as well for other ROS.    Medical History: History reviewed. No pertinent past medical history.  There is no problem list on file for this patient.   Past Surgical History:  Procedure Laterality Date   COLONOSCOPY     EGD     FLEXIBLE SIGMOIDOSCOPY     INCISION AND DRAINAGE ABSCESS ANAL     loop colostomy       Allergies  Allergen Reactions   Pantoprazole Unknown    Stomach pain   Penicillins Hives    Did it involve swelling of the  face/tongue/throat, SOB, or low BP? N Did it involve sudden or severe rash/hives, skin peeling, or any reaction on the inside of your mouth or nose? Y Did you need to seek medical attention at a hospital or doctor's office? N When did it last happen?  Childhood     If all above answers are "NO", may proceed with cephalosporin use. Did it involve swelling of the face/tongue/throat, SOB, or low BP? N Did it involve sudden or severe rash/hives, skin peeling, or any reaction on the inside of your mouth or nose? Y Did you need to seek medical attention at a hospital or doctor's office? N When did it last happen?  Childhood     If all above answers are "NO", may proceed with cephalosporin use.    Oxycodone Hcl Rash    No current outpatient medications on file prior to visit.   No current facility-administered medications on file prior to visit.    Family History  Problem Relation Age of Onset   Breast cancer Mother      Social History   Tobacco Use  Smoking Status Never  Smokeless Tobacco Never     Social History   Socioeconomic History   Marital status: Single  Tobacco Use   Smoking status: Never   Smokeless tobacco: Never  Vaping Use   Vaping Use: Never used  Substance and Sexual Activity   Alcohol use: Never   Drug use:  Never    Objective:    There were no vitals filed for this visit.   Exam Gen: NAD CV: RRR Lungs: CTA Abd: soft, parastomal hernia    Labs, Imaging and Diagnostic Testing: CT reviewed from earlier this year.  Patient has a large parastomal hernia but the defect is small.  Assessment and Plan:  Diagnoses and all orders for this visit:  Parastomal hernia without obstruction or gangrene    Patient with need for ostomy reversal.  We will plan on getting this done within the next few weeks.  Patient is now tolerating a diet and having good bowel function.  She came to the office today without an ostomy appliance on.  We were able to place a  new 1 and gave her some extra supplies.  We discussed that there is a strong likelihood that her hernia could recur after surgery.  We discussed the risk of surgery including bleeding and anastomotic dehiscence.  We discussed that if she were to have complications from surgery regarding her colon healing, she would need another ostomy.  The surgery and anatomy were described to the patient as well as the risks of surgery and the possible complications.  These include: Bleeding, deep abdominal infections and possible wound complications such as hernia and infection, damage to adjacent structures, leak of surgical connections, which can lead to other surgeries and possibly an ostomy, possible need for other procedures, such as abscess drains in radiology, possible prolonged hospital stay, possible diarrhea from removal of part of the colon, possible constipation from narcotics, possible bowel, bladder or sexual dysfunction if having rectal surgery, prolonged fatigue/weakness or appetite loss, possible early recurrence of of disease, possible complications of their medical problems such as heart disease or arrhythmias or lung problems, death (less than 1%). I believe the patient understands and wishes to proceed with the surgery.  No follow-ups on file.    Rosario Adie, MD Colon and Rectal Surgery [facility]

## 2022-01-11 ENCOUNTER — Emergency Department (HOSPITAL_COMMUNITY): Payer: Medicaid Other

## 2022-01-11 ENCOUNTER — Other Ambulatory Visit: Payer: Self-pay

## 2022-01-11 ENCOUNTER — Encounter (HOSPITAL_COMMUNITY): Payer: Self-pay | Admitting: *Deleted

## 2022-01-11 ENCOUNTER — Emergency Department (HOSPITAL_COMMUNITY)
Admission: EM | Admit: 2022-01-11 | Discharge: 2022-01-11 | Disposition: A | Discharge status: Home / Self Care | Payer: Medicaid Other | Attending: Emergency Medicine | Admitting: Emergency Medicine

## 2022-01-11 DIAGNOSIS — R1011 Right upper quadrant pain: Secondary | ICD-10-CM | POA: Insufficient documentation

## 2022-01-11 DIAGNOSIS — R195 Other fecal abnormalities: Secondary | ICD-10-CM | POA: Insufficient documentation

## 2022-01-11 DIAGNOSIS — D649 Anemia, unspecified: Secondary | ICD-10-CM | POA: Insufficient documentation

## 2022-01-11 DIAGNOSIS — R1031 Right lower quadrant pain: Secondary | ICD-10-CM | POA: Insufficient documentation

## 2022-01-11 LAB — COMPREHENSIVE METABOLIC PANEL
ALT: 26 U/L (ref 0–44)
AST: 103 U/L — ABNORMAL HIGH (ref 15–41)
Albumin: 3.9 g/dL (ref 3.5–5.0)
Alkaline Phosphatase: 135 U/L — ABNORMAL HIGH (ref 38–126)
Anion gap: 10 (ref 5–15)
BUN: 6 mg/dL (ref 6–20)
CO2: 20 mmol/L — ABNORMAL LOW (ref 22–32)
Calcium: 8.9 mg/dL (ref 8.9–10.3)
Chloride: 103 mmol/L (ref 98–111)
Creatinine, Ser: 0.47 mg/dL (ref 0.44–1.00)
GFR, Estimated: >60 mL/min (ref >60)
Glucose, Bld: 86 mg/dL (ref 70–99)
Potassium: 4.3 mmol/L (ref 3.5–5.1)
Sodium: 133 mmol/L — ABNORMAL LOW (ref 135–145)
Total Bilirubin: 0.5 mg/dL (ref 0.3–1.2)
Total Protein: 7.9 g/dL (ref 6.5–8.1)

## 2022-01-11 LAB — URINALYSIS, ROUTINE W REFLEX MICROSCOPIC
Bilirubin Urine: NEGATIVE
Glucose, UA: NEGATIVE mg/dL
Hgb urine dipstick: NEGATIVE
Ketones, ur: NEGATIVE mg/dL
Leukocytes,Ua: NEGATIVE
Nitrite: NEGATIVE
Protein, ur: NEGATIVE mg/dL
Specific Gravity, Urine: 1.002 — ABNORMAL LOW (ref 1.005–1.030)
pH: 6 (ref 5.0–8.0)

## 2022-01-11 LAB — PREGNANCY, URINE: Preg Test, Ur: NEGATIVE

## 2022-01-11 LAB — CBC
HCT: 33.1 % — ABNORMAL LOW (ref 36.0–46.0)
Hemoglobin: 10.7 g/dL — ABNORMAL LOW (ref 12.0–15.0)
MCH: 31.7 pg (ref 26.0–34.0)
MCHC: 32.3 g/dL (ref 30.0–36.0)
MCV: 97.9 fL (ref 80.0–100.0)
Platelets: 195 10*3/uL (ref 150–400)
RBC: 3.38 MIL/uL — ABNORMAL LOW (ref 3.87–5.11)
RDW: 18.3 % — ABNORMAL HIGH (ref 11.5–15.5)
WBC: 4 10*3/uL (ref 4.0–10.5)
nRBC: 0 % (ref 0.0–0.2)

## 2022-01-11 LAB — LIPASE, BLOOD: Lipase: 37 U/L (ref 11–51)

## 2022-01-11 MED ORDER — NAPROXEN 250 MG PO TABS
500.0000 mg | ORAL_TABLET | Freq: Once | ORAL | Status: AC
  Administered 2022-01-11: 500 mg via ORAL
  Filled 2022-01-11: qty 2

## 2022-01-11 MED ORDER — IOHEXOL 300 MG/ML  SOLN
100.0000 mL | Freq: Once | INTRAMUSCULAR | Status: AC | PRN
  Administered 2022-01-11: 100 mL via INTRAVENOUS

## 2022-01-11 MED ORDER — NAPROXEN 500 MG PO TABS: 500.0000 mg | ORAL_TABLET | Freq: Two times a day (BID) | ORAL | 0 refills | Status: DC

## 2022-01-11 NOTE — Discharge Instructions (Signed)
Fully all of your test have been unremarkable and the CAT scan of your abdomen did not show any surgical problems.  You have a very large hernia but this is something that you have had for quite a long time.  Your gallbladder and your liver looked okay, your blood tests were okay, your vital signs are normal.  I want you to take the following medication to help with your pain intermittently but if you get worse with more severe pain or worsening symptoms return to your doctor or the ER for an evaluation immediately.  Please take Naprosyn, 538m by mouth twice daily as needed for pain - this in an antiinflammatory medicine (NSAID) and is similar to ibuprofen - many people feel that it is stronger than ibuprofen and it is easier to take since it is a smaller pill.  Please use this only for 1 week - if your pain persists, you will need to follow up with your doctor in the office for ongoing guidance and pain control.  Thank you for allowing [facility] treat you in the emergency department today.  After reviewing your examination and potential testing that was done it appears that you are safe to go home.  I would like for you to follow-up with your doctor within the next several days, have them obtain your results and follow-up with them to review all of these tests.  If you should develop severe or worsening symptoms return to the emergency department immediately

## 2022-01-11 NOTE — ED Provider Notes (Signed)
[facility] EMERGENCY DEPARTMENT Provider Note   CSN: [id number] Arrival date & time: 01/11/22  1943     History  Chief Complaint  Patient presents with   Abdominal Pain    Alyssa Carey is a 51 y.o. female.   Abdominal Pain   Patient is a 51 year old female with a history of a prior diverting ostomy after having a rectal injury.  She has had this for 2 years.  She has been in her usual state of health until several days ago when she started to develop pain in the right upper quadrant.  This has been intermittent, seems to be a little bit worse when she eats, not associated with any nausea or vomiting.  She has had some loose stools but no blood in the stools in the ostomy bag.  She has never had a cholecystectomy or appendectomy in the past.  Home Medications Prior to Admission medications   Medication Sig Start Date End Date Taking? Authorizing Provider  acetaminophen (TYLENOL) 325 MG tablet Take 325 mg by mouth every 6 (six) hours as needed for mild pain.   Yes [provider]  atorvastatin (LIPITOR) 20 MG tablet Take 20 mg by mouth daily. 11/12/21  Yes [provider]  cholecalciferol (VITAMIN D3) 25 MCG (1000 UNIT) tablet Take 1,000 Units by mouth daily.   Yes [provider]  famotidine (PEPCID) 20 MG tablet Take 20 mg by mouth daily. 11/11/21  Yes [provider]  hydrOXYzine (ATARAX) 25 MG tablet Take 1 tablet (25 mg total) by mouth 3 (three) times daily as needed for itching. 12/08/21 03/08/22 Yes Carver, Charles K, DO  ibuprofen (ADVIL) 800 MG tablet Take 800 mg by mouth every 8 (eight) hours as needed for moderate pain.   Yes [provider]  naproxen (NAPROSYN) 500 MG tablet Take 1 tablet (500 mg total) by mouth 2 (two) times daily with a meal. 01/11/22  Yes Noemi Chapel, MD  Nutritional Supplements (BOOST PO) Take 1 Bottle by mouth 4 (four) times daily.   Yes [provider]  psyllium (HYDROCIL/METAMUCIL) 95 % PACK  Take 1 packet by mouth daily. Patient taking differently: Take 1 packet by mouth daily as needed for mild constipation. 08/28/21  Yes Tat, Shanon Brow, MD  vitamin C (ASCORBIC ACID) 500 MG tablet Take 500 mg by mouth daily.   Yes [provider]      Allergies    Pantoprazole, Penicillins, and Oxycodone hcl    Review of Systems   Review of Systems  Gastrointestinal:  Positive for abdominal pain.  All other systems reviewed and are negative.   Physical Exam Updated Vital Signs BP 118/89 (BP Location: Right Arm)   Pulse 90   Temp 98.5 F (36.9 C) (Oral)   Resp 16   Ht 1.448 m (4' 9" )   Wt 76.2 kg   SpO2 100%   BMI 36.35 kg/m  Physical Exam Vitals and nursing note reviewed.  Constitutional:      General: She is not in acute distress.    Appearance: She is well-developed.  HENT:     Head: Normocephalic and atraumatic.     Mouth/Throat:     Pharynx: No oropharyngeal exudate.  Eyes:     General: No scleral icterus.       Right eye: No discharge.        Left eye: No discharge.     Conjunctiva/sclera: Conjunctivae normal.     Pupils: Pupils are equal, round, and reactive to  light.  Neck:     Thyroid: No thyromegaly.     Vascular: No JVD.  Cardiovascular:     Rate and Rhythm: Normal rate and regular rhythm.     Heart sounds: Normal heart sounds. No murmur heard.    No friction rub. No gallop.  Pulmonary:     Effort: Pulmonary effort is normal. No respiratory distress.     Breath sounds: Normal breath sounds. No wheezing or rales.  Abdominal:     General: Bowel sounds are normal. There is no distension.     Palpations: Abdomen is soft. There is no mass.     Tenderness: There is abdominal tenderness.     Comments: Tender in the right upper right mid and right lower quadrant with mild guarding but very very soft.  No tenderness on the left, ostomy evaluated and has some yellowish-brown stool which appears soft  Musculoskeletal:        General: No tenderness. Normal  range of motion.     Cervical back: Normal range of motion and neck supple.     Right lower leg: No edema.     Left lower leg: No edema.  Lymphadenopathy:     Cervical: No cervical adenopathy.  Skin:    General: Skin is warm and dry.     Findings: No erythema or rash.  Neurological:     Mental Status: She is alert.     Coordination: Coordination normal.  Psychiatric:        Behavior: Behavior normal.     ED Results / Procedures / Treatments   Labs (all labs ordered are listed, but only abnormal results are displayed) Labs Reviewed  COMPREHENSIVE METABOLIC PANEL - Abnormal; Notable for the following components:      Result Value   Sodium 133 (*)    CO2 20 (*)    AST 103 (*)    Alkaline Phosphatase 135 (*)    All other components within normal limits  CBC - Abnormal; Notable for the following components:   RBC 3.38 (*)    Hemoglobin 10.7 (*)    HCT 33.1 (*)    RDW 18.3 (*)    All other components within normal limits  URINALYSIS, ROUTINE W REFLEX MICROSCOPIC - Abnormal; Notable for the following components:   Color, Urine COLORLESS (*)    Specific Gravity, Urine 1.002 (*)    All other components within normal limits  LIPASE, BLOOD  PREGNANCY, URINE    EKG None  Radiology CT ABDOMEN PELVIS W CONTRAST  Result Date: 01/11/2022 CLINICAL DATA:  Right side abdominal pain EXAM: CT ABDOMEN AND PELVIS WITH CONTRAST TECHNIQUE: Multidetector CT imaging of the abdomen and pelvis was performed using the standard protocol following bolus administration of intravenous contrast. RADIATION DOSE REDUCTION: This exam was performed according to the departmental dose-optimization program which includes automated exposure control, adjustment of the mA and/or kV according to patient size and/or use of iterative reconstruction technique. CONTRAST:  127m OMNIPAQUE IOHEXOL 300 MG/ML  SOLN COMPARISON:  09/02/2021 FINDINGS: Lower chest: No acute abnormality Hepatobiliary: No focal hepatic  abnormality. Small gallstones again noted. No evidence of cholecystitis. No biliary ductal dilatation. Pancreas: No focal abnormality or ductal dilatation. Spleen: No focal abnormality.  Normal size. Adrenals/Urinary Tract: No adrenal abnormality. No focal renal abnormality. No stones or hydronephrosis. Urinary bladder is unremarkable. Stomach/Bowel: Left lower quadrant ostomy with large left parastomal hernia containing much of the large and small bowel. No significant change since prior study. No bowel obstruction.  Normal appendix. Vascular/Lymphatic: No evidence of aneurysm or adenopathy. Reproductive: Uterus and adnexa unremarkable.  No mass. Other: No free fluid or free air. Musculoskeletal: No acute bony abnormality. IMPRESSION: Left lower quadrant ostomy with large parastomal hernia containing much of the large and small bowel. This is unchanged since prior study. Small gallstones noted.  No evidence of cholecystitis. No acute findings. Electronically Signed   By: Rolm Baptise M.D.   On: 01/11/2022 22:09    Procedures Procedures    Medications Ordered in ED Medications  naproxen (NAPROSYN) tablet 500 mg (has no administration in time range)  iohexol (OMNIPAQUE) 300 MG/ML solution 100 mL (100 mLs Intravenous Contrast Given 01/11/22 2151)    ED Course/ Medical Decision Making/ A&P                           Medical Decision Making Amount and/or Complexity of Data Reviewed Labs: ordered. Radiology: ordered.  Risk Prescription drug management.   No distress, no tachycardia, nonperitoneal, concern for possible surgical process such as cholecystitis or appendicitis, less likely to be kidney stone given the tenderness.  Thankfully the patient's lab work was rather unremarkable, the urinalysis did not reveal any signs of infection, the pregnancy test was negative the lipase was 37 the metabolic panel showed no renal dysfunction or elevated liver function test of any specific abnormalities  except an AST of 103.  Bilirubin was normal.  CBC showed no leukocytosis and a chronic anemia which was similar to prior labs.  Imaging: I personally viewed and interpreted the CT scan of the abdomen and pelvis which show that the patient has a large parastomal hernia, containing both large and small bowel this is similar to prior CT scans.  There is nothing in the right upper quadrant that was causing any pain that was visible on the CT scan.  Medication management: The patient was given a dose of Naprosyn prior to discharge to help with some of the pain.  No vomiting here, no fever here, no tachycardia here, vital signs are unremarkable including blood pressure of 118/89 prior to discharge.  I have discussed with the patient at the bedside the results, and the meaning of these results.  They have expressed her understanding to the need for follow-up with primary care physician        Final Clinical Impression(s) / ED Diagnoses Final diagnoses:  Right upper quadrant abdominal pain    Rx / DC Orders ED Discharge Orders          Ordered    naproxen (NAPROSYN) 500 MG tablet  2 times daily with meals        01/11/22 2252              Noemi Chapel, MD 01/11/22 2255

## 2022-01-11 NOTE — ED Triage Notes (Signed)
Pt with right sided abd pain for past 3 days. + diarrhea

## 2022-01-13 NOTE — Patient Instructions (Signed)
DUE TO COVID-19 ONLY TWO VISITORS  (aged 49 and older)  ARE ALLOWED TO COME WITH YOU AND STAY IN THE WAITING ROOM ONLY DURING PRE OP AND PROCEDURE.   **NO VISITORS ARE ALLOWED IN THE SHORT STAY AREA OR RECOVERY ROOM!!**  IF YOU WILL BE ADMITTED INTO THE HOSPITAL YOU ARE ALLOWED ONLY FOUR SUPPORT PEOPLE DURING VISITATION HOURS ONLY (7 AM -8PM)   The support person(s) must pass our screening, gel in and out, and wear a mask at all times, including in the patient's room. Patients must also wear a mask when staff or their support person are in the room. Visitors GUEST BADGE MUST BE WORN VISIBLY  One adult visitor may remain with you overnight and MUST be in the room by 8 P.M.     Your procedure is scheduled on: 01/26/22   Report to [facility] Main Entrance    Report to admitting at: 5:15 AM   Call this number if you have problems the morning of surgery [phone]   Clear liquids the day before surgery until : 4:30 AM/ DAY OF SURGERY  Water Black Coffee (sugar ok, NO MILK/CREAM OR CREAMERS)  Tea (sugar ok, NO MILK/CREAM OR CREAMERS) regular and decaf                             Plain Jell-O (NO RED)                                           Fruit ices (not with fruit pulp, NO RED)                                     Popsicles (NO RED)                                                                  Juice: apple, WHITE grape, WHITE cranberry Sports drinks like Gatorade (NO RED) Clear broth(vegetable,chicken,beef)   Drink 2 Ensure drinks AT 10:00 PM the night before surgery. The third one at: 4:30 AM the day of surgery    The day of surgery:  Drink ONE (1) Pre-Surgery Clear Ensure or G2 at AM the morning of surgery. Drink in one sitting. Do not sip.  This drink was given to you during your hospital  pre-op appointment visit. Nothing else to drink after completing the  Pre-Surgery Clear Ensure or G2.          If you have questions, please contact your surgeon's  office.  FOLLOW BOWEL PREP AND ANY ADDITIONAL PRE OP INSTRUCTIONS YOU RECEIVED FROM YOUR SURGEON'S OFFICE!!!     Oral Hygiene is also important to reduce your risk of infection.                                    Remember - BRUSH YOUR TEETH THE MORNING OF SURGERY WITH YOUR REGULAR TOOTHPASTE   Do NOT smoke after Midnight   Take these medicines  the morning of surgery with A SIP OF WATER: pepcid.Hydroxyzine,tylenol as needed.  DO NOT TAKE ANY ORAL DIABETIC MEDICATIONS DAY OF YOUR SURGERY  Bring CPAP mask and tubing day of surgery.                              You may not have any metal on your body including hair pins, jewelry, and body piercing             Do not wear make-up, lotions, powders, perfumes/cologne, or deodorant  Do not wear nail polish including gel and S&S, artificial/acrylic nails, or any other type of covering on natural nails including finger and toenails. If you have artificial nails, gel coating, etc. that needs to be removed by a nail salon please have this removed prior to surgery or surgery may need to be canceled/ delayed if the surgeon/ anesthesia feels like they are unable to be safely monitored.   Do not shave  48 hours prior to surgery.    Do not bring valuables to the hospital. [place].   Contacts, dentures or bridgework may not be worn into surgery.   Bring small overnight bag day of surgery.   DO NOT [place]. PHARMACY WILL DISPENSE MEDICATIONS LISTED ON YOUR MEDICATION LIST TO YOU DURING YOUR ADMISSION [place]!    Patients discharged on the day of surgery will not be allowed to drive home.  Someone NEEDS to stay with you for the first 24 hours after anesthesia.   Special Instructions: Bring a copy of your healthcare power of attorney and living will documents         the day of surgery if you haven't scanned them before.              Please read over the  following fact sheets you were given: IF YOU HAVE QUESTIONS ABOUT YOUR PRE-OP INSTRUCTIONS PLEASE CALL [phone]     [facility] - Preparing for Surgery Before surgery, you can play an important role.  Because skin is not sterile, your skin needs to be as free of germs as possible.  You can reduce the number of germs on your skin by washing with CHG (chlorahexidine gluconate) soap before surgery.  CHG is an antiseptic cleaner which kills germs and bonds with the skin to continue killing germs even after washing. Please DO NOT use if you have an allergy to CHG or antibacterial soaps.  If your skin becomes reddened/irritated stop using the CHG and inform your nurse when you arrive at Short Stay. Do not shave (including legs and underarms) for at least 48 hours prior to the first CHG shower.  You may shave your face/neck. Please follow these instructions carefully:  1.  Shower with CHG Soap the night before surgery and the  morning of Surgery.  2.  If you choose to wash your hair, wash your hair first as usual with your  normal  shampoo.  3.  After you shampoo, rinse your hair and body thoroughly to remove the  shampoo.                           4.  Use CHG as you would any other liquid soap.  You can apply chg directly  to the skin and wash                       Gently with a scrungie or clean washcloth.  5.  Apply the CHG Soap to your body ONLY FROM THE NECK DOWN.   Do not use on face/ open                           Wound or open sores. Avoid contact with eyes, ears mouth and genitals (private parts).                       Wash face,  Genitals (private parts) with your normal soap.             6.  Wash thoroughly, paying special attention to the area where your surgery  will be performed.  7.  Thoroughly rinse your body with warm water from the neck down.  8.  DO NOT shower/wash with your normal soap after using and rinsing off  the CHG Soap.                9.  Pat yourself dry with a clean  towel.            10.  Wear clean pajamas.            11.  Place clean sheets on your bed the night of your first shower and do not  sleep with pets. Day of Surgery : Do not apply any lotions/deodorants the morning of surgery.  Please wear clean clothes to the hospital/surgery center.  FAILURE TO FOLLOW THESE INSTRUCTIONS MAY RESULT IN THE CANCELLATION OF YOUR SURGERY PATIENT SIGNATURE_________________________________  NURSE SIGNATURE__________________________________  ________________________________________________________________________   Adam Phenix  An incentive spirometer is a tool that can help keep your lungs clear and active. This tool measures how well you are filling your lungs with each breath. Taking long deep breaths may help reverse or decrease the chance of developing breathing (pulmonary) problems (especially infection) following: A long period of time when you are unable to move or be active. BEFORE THE PROCEDURE  If the spirometer includes an indicator to show your best effort, your nurse or respiratory therapist will set it to a desired goal. If possible, sit up straight or lean slightly forward. Try not to slouch. Hold the incentive spirometer in an upright position. INSTRUCTIONS FOR USE  Sit on the edge of your bed if possible, or sit up as far as you can in bed or on a chair. Hold the incentive spirometer in an upright position. Breathe out normally. Place the mouthpiece in your mouth and seal your lips tightly around it. Breathe in slowly and as deeply as possible, raising the piston or the ball toward the top of the column. Hold your breath for 3-5 seconds or for as long as possible. Allow the piston or ball to fall to the bottom of the column. Remove the mouthpiece from your mouth and breathe out normally. Rest for a few seconds and repeat Steps 1 through 7 at least 10 times every 1-2 hours when you are awake. Take your time and take a few normal  breaths between deep breaths. The spirometer may include an indicator to show your best effort. Use the indicator as a goal to work toward during each repetition. After each set of 10 deep breaths, practice coughing to be sure your lungs are  clear. If you have an incision (the cut made at the time of surgery), support your incision when coughing by placing a pillow or rolled up towels firmly against it. Once you are able to get out of bed, walk around indoors and cough well. You may stop using the incentive spirometer when instructed by your caregiver.  RISKS AND COMPLICATIONS Take your time so you do not get dizzy or light-headed. If you are in pain, you may need to take or ask for pain medication before doing incentive spirometry. It is harder to take a deep breath if you are having pain. AFTER USE Rest and breathe slowly and easily. It can be helpful to keep track of a log of your progress. Your caregiver can provide you with a simple table to help with this. If you are using the spirometer at home, follow these instructions: [place] IF:  You are having difficultly using the spirometer. You have trouble using the spirometer as often as instructed. Your pain medication is not giving enough relief while using the spirometer. You develop fever of 100.5 F (38.1 C) or higher. SEEK IMMEDIATE MEDICAL CARE IF:  You cough up bloody sputum that had not been present before. You develop fever of 102 F (38.9 C) or greater. You develop worsening pain at or near the incision site. MAKE SURE YOU:  Understand these instructions. Will watch your condition. Will get help right away if you are not doing well or get worse. Document Released: 11/13/2006 Document Revised: 09/25/2011 Document Reviewed: 01/14/2007 [facility] Patient Information 2014 [place], [place].   ________________________________________________________________________

## 2022-01-16 ENCOUNTER — Encounter (HOSPITAL_COMMUNITY): Payer: Self-pay

## 2022-01-16 ENCOUNTER — Other Ambulatory Visit: Payer: Self-pay

## 2022-01-16 ENCOUNTER — Encounter (HOSPITAL_COMMUNITY)
Admission: RE | Admit: 2022-01-16 | Discharge: 2022-01-16 | Disposition: A | Discharge status: Home / Self Care | Payer: Medicaid Other | Source: Ambulatory Visit | Attending: General Surgery | Admitting: General Surgery

## 2022-01-16 VITALS — BP 127/92 | HR 83 | Temp 98.1°F | Ht <= 58 in | Wt 169.0 lb

## 2022-01-16 DIAGNOSIS — I10 Essential (primary) hypertension: Secondary | ICD-10-CM

## 2022-01-16 DIAGNOSIS — Z01818 Encounter for other preprocedural examination: Secondary | ICD-10-CM

## 2022-01-16 DIAGNOSIS — Z01812 Encounter for preprocedural laboratory examination: Secondary | ICD-10-CM | POA: Insufficient documentation

## 2022-01-16 LAB — CBC
HCT: 35.5 % — ABNORMAL LOW (ref 36.0–46.0)
Hemoglobin: 11 g/dL — ABNORMAL LOW (ref 12.0–15.0)
MCH: 32.1 pg (ref 26.0–34.0)
MCHC: 31 g/dL (ref 30.0–36.0)
MCV: 103.5 fL — ABNORMAL HIGH (ref 80.0–100.0)
Platelets: 204 10*3/uL (ref 150–400)
RBC: 3.43 MIL/uL — ABNORMAL LOW (ref 3.87–5.11)
RDW: 17.2 % — ABNORMAL HIGH (ref 11.5–15.5)
WBC: 4.1 10*3/uL (ref 4.0–10.5)
nRBC: 0 % (ref 0.0–0.2)

## 2022-01-16 NOTE — Progress Notes (Signed)
For Short Stay: [place] appointment date: Date of COVID positive in last 68 days:  Bowel Prep reminder:   For Anesthesia: PCP - Dr. Gar Ponto Cardiologist - Dr. Jodelle Gross [place]  Chest x-ray - CT chest: 01/30/22 EKG - 09/02/21 Stress Test -  ECHO - 08/14/21 Cardiac Cath -  Pacemaker/ICD device last checked: Pacemaker orders received: Device Rep notified:  Spinal Cord Stimulator:  Sleep Study -  CPAP -   Fasting Blood Sugar -  Checks Blood Sugar _____ times a day Date and result of last Hgb A1c-  Blood Thinner Instructions: Aspirin Instructions: Last Dose:  Activity level: Can go up a flight of stairs and activities of daily living without stopping and without chest pain and/or shortness of breath   Able to exercise without chest pain and/or shortness of breath   Unable to go up a flight of stairs without chest pain and/or shortness of breath     Anesthesia review: Hx: HTN  Patient denies shortness of breath, fever, cough and chest pain at PAT appointment   Patient verbalized understanding of instructions that were given to them at the PAT appointment. Patient was also instructed that they will need to review over the PAT instructions again at home before surgery.

## 2022-01-24 ENCOUNTER — Encounter: Payer: Self-pay | Admitting: *Deleted

## 2022-01-26 ENCOUNTER — Inpatient Hospital Stay (HOSPITAL_COMMUNITY)
Admission: RE | Admit: 2022-01-26 | Discharge: 2022-02-03 | DRG: 330 | Disposition: A | Discharge status: Home / Self Care | Payer: Self-pay | Attending: General Surgery | Admitting: General Surgery

## 2022-01-26 ENCOUNTER — Inpatient Hospital Stay (HOSPITAL_COMMUNITY): Payer: Self-pay | Admitting: Anesthesiology

## 2022-01-26 ENCOUNTER — Encounter (HOSPITAL_COMMUNITY)
Admission: RE | Disposition: A | Discharge status: Home / Self Care | Payer: Self-pay | Source: Home / Self Care | Attending: General Surgery

## 2022-01-26 ENCOUNTER — Inpatient Hospital Stay (HOSPITAL_COMMUNITY): Payer: Self-pay | Admitting: Physician Assistant

## 2022-01-26 ENCOUNTER — Encounter (HOSPITAL_COMMUNITY): Payer: Self-pay | Admitting: General Surgery

## 2022-01-26 ENCOUNTER — Other Ambulatory Visit: Payer: Self-pay

## 2022-01-26 DIAGNOSIS — Z791 Long term (current) use of non-steroidal anti-inflammatories (NSAID): Secondary | ICD-10-CM

## 2022-01-26 DIAGNOSIS — Z789 Other specified health status: Secondary | ICD-10-CM | POA: Diagnosis present

## 2022-01-26 DIAGNOSIS — Z433 Encounter for attention to colostomy: Secondary | ICD-10-CM

## 2022-01-26 DIAGNOSIS — Z8711 Personal history of peptic ulcer disease: Secondary | ICD-10-CM

## 2022-01-26 DIAGNOSIS — Z8739 Personal history of other diseases of the musculoskeletal system and connective tissue: Secondary | ICD-10-CM

## 2022-01-26 DIAGNOSIS — I10 Essential (primary) hypertension: Secondary | ICD-10-CM | POA: Diagnosis present

## 2022-01-26 DIAGNOSIS — F10939 Alcohol use, unspecified with withdrawal, unspecified: Secondary | ICD-10-CM

## 2022-01-26 DIAGNOSIS — Z885 Allergy status to narcotic agent status: Secondary | ICD-10-CM

## 2022-01-26 DIAGNOSIS — F1090 Alcohol use, unspecified, uncomplicated: Secondary | ICD-10-CM | POA: Diagnosis present

## 2022-01-26 DIAGNOSIS — E66813 Obesity, class 3: Secondary | ICD-10-CM

## 2022-01-26 DIAGNOSIS — K435 Parastomal hernia without obstruction or  gangrene: Principal | ICD-10-CM | POA: Diagnosis present

## 2022-01-26 DIAGNOSIS — E785 Hyperlipidemia, unspecified: Secondary | ICD-10-CM | POA: Diagnosis present

## 2022-01-26 DIAGNOSIS — F10231 Alcohol dependence with withdrawal delirium: Secondary | ICD-10-CM | POA: Diagnosis not present

## 2022-01-26 DIAGNOSIS — D649 Anemia, unspecified: Secondary | ICD-10-CM

## 2022-01-26 DIAGNOSIS — K529 Noninfective gastroenteritis and colitis, unspecified: Secondary | ICD-10-CM | POA: Diagnosis present

## 2022-01-26 DIAGNOSIS — Z79899 Other long term (current) drug therapy: Secondary | ICD-10-CM

## 2022-01-26 DIAGNOSIS — E876 Hypokalemia: Secondary | ICD-10-CM

## 2022-01-26 DIAGNOSIS — Z880 Allergy status to penicillin: Secondary | ICD-10-CM

## 2022-01-26 DIAGNOSIS — Z01818 Encounter for other preprocedural examination: Secondary | ICD-10-CM

## 2022-01-26 DIAGNOSIS — Z8371 Family history of colonic polyps: Secondary | ICD-10-CM

## 2022-01-26 DIAGNOSIS — Z933 Colostomy status: Principal | ICD-10-CM

## 2022-01-26 DIAGNOSIS — Z888 Allergy status to other drugs, medicaments and biological substances status: Secondary | ICD-10-CM

## 2022-01-26 DIAGNOSIS — R9431 Abnormal electrocardiogram [ECG] [EKG]: Secondary | ICD-10-CM | POA: Diagnosis present

## 2022-01-26 DIAGNOSIS — Z6841 Body Mass Index (BMI) 40.0 and over, adult: Secondary | ICD-10-CM

## 2022-01-26 HISTORY — DX: Alcohol abuse, uncomplicated: F10.10

## 2022-01-26 HISTORY — PX: COLOSTOMY TAKEDOWN: SHX5258

## 2022-01-26 LAB — TYPE AND SCREEN
ABO/RH(D): A POS
Antibody Screen: NEGATIVE

## 2022-01-26 LAB — CBC
HCT: 35.6 % — ABNORMAL LOW (ref 36.0–46.0)
Hemoglobin: 11.7 g/dL — ABNORMAL LOW (ref 12.0–15.0)
MCH: 33 pg (ref 26.0–34.0)
MCHC: 32.9 g/dL (ref 30.0–36.0)
MCV: 100.3 fL — ABNORMAL HIGH (ref 80.0–100.0)
Platelets: 183 10*3/uL (ref 150–400)
RBC: 3.55 MIL/uL — ABNORMAL LOW (ref 3.87–5.11)
RDW: 14.5 % (ref 11.5–15.5)
WBC: 3.1 10*3/uL — ABNORMAL LOW (ref 4.0–10.5)
nRBC: 0 % (ref 0.0–0.2)

## 2022-01-26 LAB — PREGNANCY, URINE: Preg Test, Ur: NEGATIVE

## 2022-01-26 SURGERY — CLOSURE, COLOSTOMY, LAPAROSCOPIC
Anesthesia: General

## 2022-01-26 MED ORDER — HEPARIN SODIUM (PORCINE) 5000 UNIT/ML IJ SOLN
5000.0000 [IU] | Freq: Once | INTRAMUSCULAR | Status: AC
  Administered 2022-01-26: 5000 [IU] via SUBCUTANEOUS
  Filled 2022-01-26: qty 1

## 2022-01-26 MED ORDER — PROPOFOL 10 MG/ML IV BOLUS
INTRAVENOUS | Status: AC
  Filled 2022-01-26: qty 20

## 2022-01-26 MED ORDER — FENTANYL CITRATE (PF) 250 MCG/5ML IJ SOLN
INTRAMUSCULAR | Status: AC
  Filled 2022-01-26: qty 5

## 2022-01-26 MED ORDER — ALVIMOPAN 12 MG PO CAPS
12.0000 mg | ORAL_CAPSULE | ORAL | Status: AC
  Administered 2022-01-26: 12 mg via ORAL
  Filled 2022-01-26: qty 1

## 2022-01-26 MED ORDER — BUPIVACAINE-EPINEPHRINE (PF) 0.25% -1:200000 IJ SOLN
INTRAMUSCULAR | Status: AC
  Filled 2022-01-26: qty 30

## 2022-01-26 MED ORDER — SCOPOLAMINE 1 MG/3DAYS TD PT72
1.0000 | MEDICATED_PATCH | TRANSDERMAL | Status: DC
Start: 2022-01-26 — End: 2022-01-26
  Administered 2022-01-26: 1 via TRANSDERMAL

## 2022-01-26 MED ORDER — SODIUM CHLORIDE 0.9 % IV SOLN
2.0000 g | INTRAVENOUS | Status: AC
  Administered 2022-01-26: 2 g via INTRAVENOUS
  Filled 2022-01-26: qty 2

## 2022-01-26 MED ORDER — PHENYLEPHRINE 80 MCG/ML (10ML) SYRINGE FOR IV PUSH (FOR BLOOD PRESSURE SUPPORT)
PREFILLED_SYRINGE | INTRAVENOUS | Status: DC | PRN
  Administered 2022-01-26 (×5): 160 ug via INTRAVENOUS

## 2022-01-26 MED ORDER — KETOROLAC TROMETHAMINE 30 MG/ML IJ SOLN
INTRAMUSCULAR | Status: AC
  Filled 2022-01-26: qty 1

## 2022-01-26 MED ORDER — BUPIVACAINE LIPOSOME 1.3 % IJ SUSP: 20.0000 mL | Freq: Once | INTRAMUSCULAR | Status: DC

## 2022-01-26 MED ORDER — SUGAMMADEX SODIUM 200 MG/2ML IV SOLN
INTRAVENOUS | Status: DC | PRN
  Administered 2022-01-26: 400 mg via INTRAVENOUS

## 2022-01-26 MED ORDER — FENTANYL CITRATE (PF) 250 MCG/5ML IJ SOLN
INTRAMUSCULAR | Status: DC | PRN
  Administered 2022-01-26 (×2): 25 ug via INTRAVENOUS
  Administered 2022-01-26: 50 ug via INTRAVENOUS
  Administered 2022-01-26: 25 ug via INTRAVENOUS
  Administered 2022-01-26: 150 ug via INTRAVENOUS

## 2022-01-26 MED ORDER — ORAL CARE MOUTH RINSE: 15.0000 mL | Freq: Once | OROMUCOSAL | Status: AC

## 2022-01-26 MED ORDER — GABAPENTIN 300 MG PO CAPS
300.0000 mg | ORAL_CAPSULE | ORAL | Status: AC
  Administered 2022-01-26: 300 mg via ORAL
  Filled 2022-01-26: qty 1

## 2022-01-26 MED ORDER — ONDANSETRON HCL 4 MG/2ML IJ SOLN
INTRAMUSCULAR | Status: AC
  Filled 2022-01-26: qty 4

## 2022-01-26 MED ORDER — ACETAMINOPHEN 500 MG PO TABS
1000.0000 mg | ORAL_TABLET | Freq: Four times a day (QID) | ORAL | Status: DC
  Administered 2022-01-26 – 2022-01-27 (×4): 1000 mg via ORAL
  Filled 2022-01-26 (×4): qty 2

## 2022-01-26 MED ORDER — FAMOTIDINE 20 MG PO TABS
20.0000 mg | ORAL_TABLET | Freq: Every day | ORAL | Status: DC
  Administered 2022-01-26 – 2022-02-03 (×9): 20 mg via ORAL
  Filled 2022-01-26 (×9): qty 1

## 2022-01-26 MED ORDER — PHENYLEPHRINE 80 MCG/ML (10ML) SYRINGE FOR IV PUSH (FOR BLOOD PRESSURE SUPPORT)
PREFILLED_SYRINGE | INTRAVENOUS | Status: AC
Start: 2022-01-26 — End: ?
  Filled 2022-01-26: qty 20

## 2022-01-26 MED ORDER — KCL IN DEXTROSE-NACL 20-5-0.45 MEQ/L-%-% IV SOLN
INTRAVENOUS | Status: DC
Start: 2022-01-26 — End: 2022-01-27
  Filled 2022-01-26: qty 1000

## 2022-01-26 MED ORDER — KETOROLAC TROMETHAMINE 30 MG/ML IJ SOLN
30.0000 mg | Freq: Once | INTRAMUSCULAR | Status: AC | PRN
  Administered 2022-01-26: 30 mg via INTRAVENOUS

## 2022-01-26 MED ORDER — LACTATED RINGERS IV SOLN: INTRAVENOUS | Status: DC

## 2022-01-26 MED ORDER — EPHEDRINE SULFATE-NACL 50-0.9 MG/10ML-% IV SOSY
PREFILLED_SYRINGE | INTRAVENOUS | Status: DC | PRN
  Administered 2022-01-26: 5 mg via INTRAVENOUS
  Administered 2022-01-26: 10 mg via INTRAVENOUS
  Administered 2022-01-26: 5 mg via INTRAVENOUS

## 2022-01-26 MED ORDER — BUPIVACAINE-EPINEPHRINE 0.25% -1:200000 IJ SOLN
INTRAMUSCULAR | Status: DC | PRN
  Administered 2022-01-26: 30 mL

## 2022-01-26 MED ORDER — ENSURE SURGERY PO LIQD
237.0000 mL | Freq: Two times a day (BID) | ORAL | Status: DC
  Administered 2022-01-26 – 2022-02-01 (×4): 237 mL via ORAL
  Filled 2022-01-26 (×10): qty 237

## 2022-01-26 MED ORDER — 0.9 % SODIUM CHLORIDE (POUR BTL) OPTIME
TOPICAL | Status: DC | PRN
  Administered 2022-01-26: 1000 mL

## 2022-01-26 MED ORDER — LACTATED RINGERS IR SOLN
Status: DC | PRN
  Administered 2022-01-26: 1000 mL

## 2022-01-26 MED ORDER — MIDAZOLAM HCL 2 MG/2ML IJ SOLN
INTRAMUSCULAR | Status: DC | PRN
  Administered 2022-01-26: 2 mg via INTRAVENOUS

## 2022-01-26 MED ORDER — AMISULPRIDE (ANTIEMETIC) 5 MG/2ML IV SOLN: 10.0000 mg | Freq: Once | INTRAVENOUS | Status: DC | PRN

## 2022-01-26 MED ORDER — SIMETHICONE 80 MG PO CHEW: 40.0000 mg | CHEWABLE_TABLET | Freq: Four times a day (QID) | ORAL | Status: DC | PRN

## 2022-01-26 MED ORDER — EPHEDRINE 5 MG/ML INJ
INTRAVENOUS | Status: AC
  Filled 2022-01-26: qty 5

## 2022-01-26 MED ORDER — POLYETHYLENE GLYCOL 3350 17 GM/SCOOP PO POWD: 1.0000 | Freq: Once | ORAL | Status: DC

## 2022-01-26 MED ORDER — FENTANYL CITRATE (PF) 100 MCG/2ML IJ SOLN
INTRAMUSCULAR | Status: AC
  Filled 2022-01-26: qty 2

## 2022-01-26 MED ORDER — SCOPOLAMINE 1 MG/3DAYS TD PT72
MEDICATED_PATCH | TRANSDERMAL | Status: AC
  Filled 2022-01-26: qty 1

## 2022-01-26 MED ORDER — BUPIVACAINE LIPOSOME 1.3 % IJ SUSP
INTRAMUSCULAR | Status: DC | PRN
  Administered 2022-01-26: 20 mL

## 2022-01-26 MED ORDER — DEXAMETHASONE SODIUM PHOSPHATE 10 MG/ML IJ SOLN
INTRAMUSCULAR | Status: DC | PRN
  Administered 2022-01-26: 10 mg via INTRAVENOUS

## 2022-01-26 MED ORDER — PHENYLEPHRINE HCL-NACL 20-0.9 MG/250ML-% IV SOLN
INTRAVENOUS | Status: DC | PRN
  Administered 2022-01-26: 20 ug/min via INTRAVENOUS

## 2022-01-26 MED ORDER — FENTANYL CITRATE PF 50 MCG/ML IJ SOSY
PREFILLED_SYRINGE | INTRAMUSCULAR | Status: AC
  Administered 2022-01-26: 50 ug via INTRAVENOUS
  Filled 2022-01-26: qty 3

## 2022-01-26 MED ORDER — ENSURE PRE-SURGERY PO LIQD
296.0000 mL | Freq: Once | ORAL | Status: DC
  Filled 2022-01-26: qty 296

## 2022-01-26 MED ORDER — BISACODYL 5 MG PO TBEC: 20.0000 mg | DELAYED_RELEASE_TABLET | Freq: Once | ORAL | Status: DC

## 2022-01-26 MED ORDER — ALUM & MAG HYDROXIDE-SIMETH 200-200-20 MG/5ML PO SUSP: 30.0000 mL | Freq: Four times a day (QID) | ORAL | Status: DC | PRN

## 2022-01-26 MED ORDER — PROCHLORPERAZINE EDISYLATE 10 MG/2ML IJ SOLN
10.0000 mg | Freq: Four times a day (QID) | INTRAMUSCULAR | Status: DC | PRN
  Administered 2022-01-26 – 2022-02-01 (×2): 10 mg via INTRAVENOUS
  Filled 2022-01-26 (×3): qty 2

## 2022-01-26 MED ORDER — PROPOFOL 10 MG/ML IV BOLUS
INTRAVENOUS | Status: DC | PRN
  Administered 2022-01-26: 150 mg via INTRAVENOUS

## 2022-01-26 MED ORDER — ROCURONIUM BROMIDE 10 MG/ML (PF) SYRINGE
PREFILLED_SYRINGE | INTRAVENOUS | Status: AC
  Filled 2022-01-26: qty 20

## 2022-01-26 MED ORDER — AMISULPRIDE (ANTIEMETIC) 5 MG/2ML IV SOLN
INTRAVENOUS | Status: AC
  Filled 2022-01-26: qty 4

## 2022-01-26 MED ORDER — CHLORHEXIDINE GLUCONATE 0.12 % MT SOLN
15.0000 mL | Freq: Once | OROMUCOSAL | Status: AC
  Administered 2022-01-26: 15 mL via OROMUCOSAL

## 2022-01-26 MED ORDER — ONDANSETRON HCL 4 MG/2ML IJ SOLN
INTRAMUSCULAR | Status: DC | PRN
  Administered 2022-01-26: 4 mg via INTRAVENOUS

## 2022-01-26 MED ORDER — BUPIVACAINE LIPOSOME 1.3 % IJ SUSP
INTRAMUSCULAR | Status: AC
  Filled 2022-01-26: qty 20

## 2022-01-26 MED ORDER — ENOXAPARIN SODIUM 40 MG/0.4ML IJ SOSY
40.0000 mg | PREFILLED_SYRINGE | INTRAMUSCULAR | Status: DC
Start: 2022-01-27 — End: 2022-01-28
  Administered 2022-01-27: 40 mg via SUBCUTANEOUS
  Filled 2022-01-26: qty 0.4

## 2022-01-26 MED ORDER — HYDROMORPHONE HCL 1 MG/ML IJ SOLN
0.5000 mg | INTRAMUSCULAR | Status: DC | PRN
  Administered 2022-01-26 – 2022-01-30 (×5): 0.5 mg via INTRAVENOUS
  Filled 2022-01-26 (×3): qty 0.5
  Filled 2022-01-26: qty 1
  Filled 2022-01-26: qty 0.5

## 2022-01-26 MED ORDER — SACCHAROMYCES BOULARDII 250 MG PO CAPS
250.0000 mg | ORAL_CAPSULE | Freq: Two times a day (BID) | ORAL | Status: DC
  Administered 2022-01-26 – 2022-02-03 (×17): 250 mg via ORAL
  Filled 2022-01-26 (×17): qty 1

## 2022-01-26 MED ORDER — ACETAMINOPHEN 500 MG PO TABS
1000.0000 mg | ORAL_TABLET | ORAL | Status: AC
  Administered 2022-01-26: 1000 mg via ORAL
  Filled 2022-01-26: qty 2

## 2022-01-26 MED ORDER — HYDROXYZINE HCL 25 MG PO TABS: 25.0000 mg | ORAL_TABLET | Freq: Three times a day (TID) | ORAL | Status: DC | PRN

## 2022-01-26 MED ORDER — GABAPENTIN 300 MG PO CAPS
300.0000 mg | ORAL_CAPSULE | Freq: Two times a day (BID) | ORAL | Status: DC
Start: 2022-01-26 — End: 2022-01-29
  Administered 2022-01-26 – 2022-01-29 (×7): 300 mg via ORAL
  Filled 2022-01-26 (×7): qty 1

## 2022-01-26 MED ORDER — LIDOCAINE HCL (PF) 2 % IJ SOLN
INTRAMUSCULAR | Status: AC
  Filled 2022-01-26: qty 10

## 2022-01-26 MED ORDER — MIDAZOLAM HCL 2 MG/2ML IJ SOLN
INTRAMUSCULAR | Status: AC
  Filled 2022-01-26: qty 2

## 2022-01-26 MED ORDER — LIDOCAINE 2% (20 MG/ML) 5 ML SYRINGE
INTRAMUSCULAR | Status: DC | PRN
  Administered 2022-01-26: 60 mg via INTRAVENOUS

## 2022-01-26 MED ORDER — PHENYLEPHRINE HCL-NACL 20-0.9 MG/250ML-% IV SOLN
INTRAVENOUS | Status: AC
  Filled 2022-01-26: qty 750

## 2022-01-26 MED ORDER — ENSURE PRE-SURGERY PO LIQD
592.0000 mL | Freq: Once | ORAL | Status: DC
  Filled 2022-01-26: qty 592

## 2022-01-26 MED ORDER — DEXAMETHASONE SODIUM PHOSPHATE 10 MG/ML IJ SOLN
INTRAMUSCULAR | Status: AC
  Filled 2022-01-26: qty 2

## 2022-01-26 MED ORDER — ALVIMOPAN 12 MG PO CAPS
12.0000 mg | ORAL_CAPSULE | Freq: Two times a day (BID) | ORAL | Status: DC
  Administered 2022-01-27 – 2022-01-29 (×5): 12 mg via ORAL
  Filled 2022-01-26 (×5): qty 1

## 2022-01-26 MED ORDER — FENTANYL CITRATE PF 50 MCG/ML IJ SOSY
25.0000 ug | PREFILLED_SYRINGE | INTRAMUSCULAR | Status: DC | PRN
  Administered 2022-01-26 (×2): 50 ug via INTRAVENOUS

## 2022-01-26 MED ORDER — ROCURONIUM BROMIDE 10 MG/ML (PF) SYRINGE
PREFILLED_SYRINGE | INTRAVENOUS | Status: DC | PRN
  Administered 2022-01-26: 50 mg via INTRAVENOUS
  Administered 2022-01-26: 20 mg via INTRAVENOUS

## 2022-01-26 MED ORDER — KETOROLAC TROMETHAMINE 30 MG/ML IJ SOLN
30.0000 mg | Freq: Four times a day (QID) | INTRAMUSCULAR | Status: DC | PRN
Start: 2022-01-26 — End: 2022-01-27
  Administered 2022-01-26 – 2022-01-27 (×2): 30 mg via INTRAVENOUS
  Filled 2022-01-26 (×2): qty 1

## 2022-01-26 SURGICAL SUPPLY — 74 items
APPLIER CLIP 5 13 M/L LIGAMAX5 (MISCELLANEOUS)
BAG COUNTER SPONGE SURGICOUNT (BAG) ×1 IMPLANT
BINDER ABDOMINAL 12 ML 46-62 (SOFTGOODS) ×1 IMPLANT
BLADE EXTENDED COATED 6.5IN (ELECTRODE) IMPLANT
CABLE HIGH FREQUENCY MONO STRZ (ELECTRODE) IMPLANT
CELLS DAT CNTRL 66122 CELL SVR (MISCELLANEOUS) IMPLANT
CHLORAPREP W/TINT 26 (MISCELLANEOUS) ×2 IMPLANT
CLIP APPLIE 5 13 M/L LIGAMAX5 (MISCELLANEOUS) IMPLANT
DERMABOND ADVANCED (GAUZE/BANDAGES/DRESSINGS) ×1
DERMABOND ADVANCED .7 DNX12 (GAUZE/BANDAGES/DRESSINGS) ×1 IMPLANT
DRAIN CHANNEL 19F RND (DRAIN) IMPLANT
DRAPE LAPAROSCOPIC ABDOMINAL (DRAPES) ×2 IMPLANT
DRAPE SURG IRRIG POUCH 19X23 (DRAPES) ×2 IMPLANT
DRSG OPSITE POSTOP 4X10 (GAUZE/BANDAGES/DRESSINGS) IMPLANT
DRSG OPSITE POSTOP 4X6 (GAUZE/BANDAGES/DRESSINGS) IMPLANT
DRSG OPSITE POSTOP 4X8 (GAUZE/BANDAGES/DRESSINGS) IMPLANT
ELECT REM PT RETURN 15FT ADLT (MISCELLANEOUS) ×2 IMPLANT
EVACUATOR SILICONE 100CC (DRAIN) IMPLANT
GAUZE SPONGE 4X4 12PLY STRL (GAUZE/BANDAGES/DRESSINGS) IMPLANT
GLOVE BIO SURGEON STRL SZ 6.5 (GLOVE) ×4 IMPLANT
GLOVE BIOGEL PI IND STRL 7.0 (GLOVE) ×2 IMPLANT
GLOVE BIOGEL PI INDICATOR 7.0 (GLOVE) ×2
GLOVE INDICATOR 6.5 STRL GRN (GLOVE) ×2 IMPLANT
GOWN STRL REUS W/ TWL XL LVL3 (GOWN DISPOSABLE) ×6 IMPLANT
GOWN STRL REUS W/TWL XL LVL3 (GOWN DISPOSABLE) ×6
HOLDER FOLEY CATH W/STRAP (MISCELLANEOUS) ×2 IMPLANT
IRRIG SUCT STRYKERFLOW 2 WTIP (MISCELLANEOUS) ×2
IRRIGATION SUCT STRKRFLW 2 WTP (MISCELLANEOUS) ×1 IMPLANT
KIT TURNOVER KIT A (KITS) ×1 IMPLANT
NDL INSUFFLATION 14GA 120MM (NEEDLE) IMPLANT
NEEDLE INSUFFLATION 14GA 120MM (NEEDLE) ×2 IMPLANT
PACK COLON (CUSTOM PROCEDURE TRAY) ×2 IMPLANT
PAD POSITIONING PINK XL (MISCELLANEOUS) ×2 IMPLANT
PENCIL SMOKE EVACUATOR (MISCELLANEOUS) IMPLANT
PROTECTOR NERVE ULNAR (MISCELLANEOUS) ×2 IMPLANT
RELOAD PROXIMATE 75MM BLUE (ENDOMECHANICALS) ×4 IMPLANT
RELOAD STAPLE 75 3.8 BLU REG (ENDOMECHANICALS) IMPLANT
RETRACTOR WND ALEXIS 18 MED (MISCELLANEOUS) IMPLANT
RTRCTR WOUND ALEXIS 18CM MED (MISCELLANEOUS)
SCISSORS LAP 5X35 DISP (ENDOMECHANICALS) ×2 IMPLANT
SEALER TISSUE G2 STRG ARTC 35C (ENDOMECHANICALS) ×1 IMPLANT
SET TUBE SMOKE EVAC HIGH FLOW (TUBING) ×2 IMPLANT
SLEEVE ADV FIXATION 5X100MM (TROCAR) ×4 IMPLANT
SLEEVE Z-THREAD 5X100MM (TROCAR) ×3 IMPLANT
SPIKE FLUID TRANSFER (MISCELLANEOUS) ×1 IMPLANT
SPONGE DRAIN TRACH 4X4 STRL 2S (GAUZE/BANDAGES/DRESSINGS) IMPLANT
STAPLER 90 3.5 STAND SLIM (STAPLE) ×2
STAPLER 90 3.5 STD SLIM (STAPLE) IMPLANT
STAPLER PROXIMATE 75MM BLUE (STAPLE) ×1 IMPLANT
SURGILUBE 2OZ TUBE FLIPTOP (MISCELLANEOUS) ×2 IMPLANT
SUT ETHILON 2 0 PS N (SUTURE) IMPLANT
SUT NOVA 1 T20/GS 25DT (SUTURE) ×2 IMPLANT
SUT NOVA NAB DX-16 0-1 5-0 T12 (SUTURE) ×2 IMPLANT
SUT PROLENE 2 0 KS (SUTURE) IMPLANT
SUT SILK 2 0 (SUTURE) ×1
SUT SILK 2 0 SH CR/8 (SUTURE) IMPLANT
SUT SILK 2-0 18XBRD TIE 12 (SUTURE) ×1 IMPLANT
SUT SILK 3 0 (SUTURE)
SUT SILK 3 0 SH CR/8 (SUTURE) ×2 IMPLANT
SUT SILK 3-0 18XBRD TIE 12 (SUTURE) IMPLANT
SUT VIC AB 2-0 SH 18 (SUTURE) ×2 IMPLANT
SUT VIC AB 2-0 SH 27 (SUTURE) ×1
SUT VIC AB 2-0 SH 27XBRD (SUTURE) IMPLANT
SUT VIC AB 4-0 PS2 27 (SUTURE) ×2 IMPLANT
SUT VICRYL 0 UR6 27IN ABS (SUTURE) ×2 IMPLANT
SYS LAPSCP GELPORT 120MM (MISCELLANEOUS)
SYS WOUND ALEXIS 18CM MED (MISCELLANEOUS)
SYSTEM LAPSCP GELPORT 120MM (MISCELLANEOUS) IMPLANT
SYSTEM WOUND ALEXIS 18CM MED (MISCELLANEOUS) ×1 IMPLANT
TOWEL OR NON WOVEN STRL DISP B (DISPOSABLE) ×2 IMPLANT
TRAY FOLEY MTR SLVR 16FR STAT (SET/KITS/TRAYS/PACK) IMPLANT
TROCAR BALLN 12MMX100 BLUNT (TROCAR) IMPLANT
TROCAR Z-THREAD OPTICAL 5X100M (TROCAR) ×2 IMPLANT
TUBING CONNECTING 10 (TUBING) ×2 IMPLANT

## 2022-01-26 NOTE — Anesthesia Preprocedure Evaluation (Addendum)
Anesthesia Evaluation  Patient identified by MRN, date of birth, ID band Patient awake    Reviewed: Allergy & Precautions, NPO status , Patient's Chart, lab work & pertinent test results  Airway Mallampati: II  TM Distance: >3 FB Neck ROM: Full    Dental no notable dental hx.    Pulmonary neg pulmonary ROS,    Pulmonary exam normal        Cardiovascular hypertension, Normal cardiovascular exam     Neuro/Psych negative neurological ROS  negative psych ROS   GI/Hepatic PUD, Bowel prep,(+)     substance abuse  , Hepatitis -  Endo/Other  negative endocrine ROS  Renal/GU Renal disease     Musculoskeletal negative musculoskeletal ROS (+)   Abdominal (+) + obese,   Peds  Hematology  (+) Blood dyscrasia, anemia ,   Anesthesia Other Findings COLOSTOMY PARASTOMAL HERNIA  Reproductive/Obstetrics hcg negative                          Anesthesia Physical Anesthesia Plan  ASA: 2  Anesthesia Plan: General   Post-op Pain Management:    Induction: Intravenous  PONV Risk Score and Plan: 4 or greater and Ondansetron, Dexamethasone, Midazolam, Scopolamine patch - Pre-op and Treatment may vary due to age or medical condition  Airway Management Planned: Oral ETT  Additional Equipment:   Intra-op Plan:   Post-operative Plan: Extubation in OR  Informed Consent: I have reviewed the patients History and Physical, chart, labs and discussed the procedure including the risks, benefits and alternatives for the proposed anesthesia with the patient or authorized representative who has indicated his/her understanding and acceptance.     Dental advisory given  Plan Discussed with: CRNA  Anesthesia Plan Comments:        Anesthesia Quick Evaluation

## 2022-01-26 NOTE — Anesthesia Procedure Notes (Signed)
Procedure Name: Intubation Date/Time: 01/26/2022 7:32 AM  Performed by: Vonna Drafts, CRNAPre-anesthesia Checklist: Patient identified, Emergency Drugs available, Suction available and Patient being monitored Patient Re-evaluated:Patient Re-evaluated prior to induction Oxygen Delivery Method: Circle system utilized Preoxygenation: Pre-oxygenation with 100% oxygen Induction Type: IV induction Ventilation: Mask ventilation without difficulty Laryngoscope Size: Mac and 3 Grade View: Grade I Tube type: Oral Tube size: 7.0 mm Number of attempts: 1 Airway Equipment and Method: Stylet and Oral airway Placement Confirmation: ETT inserted through vocal cords under direct vision, positive ETCO2 and breath sounds checked- equal and bilateral Secured at: 21 cm Tube secured with: Tape Dental Injury: Teeth and Oropharynx as per pre-operative assessment

## 2022-01-26 NOTE — Anesthesia Postprocedure Evaluation (Signed)
Anesthesia Post Note  Patient: NIKKOL PAI  Procedure(s) Performed: LAPAROSCOPIC COLOSTOMY TAKEDOWN     Patient location during evaluation: PACU Anesthesia Type: General Level of consciousness: awake Pain management: pain level controlled Vital Signs Assessment: post-procedure vital signs reviewed and stable Respiratory status: spontaneous breathing, nonlabored ventilation, respiratory function stable and patient connected to nasal cannula oxygen Cardiovascular status: blood pressure returned to baseline and stable Postop Assessment: no apparent nausea or vomiting Anesthetic complications: no   No notable events documented.  Last Vitals:  Vitals:   01/26/22 1303 01/26/22 1407  BP: 122/80 137/85  Pulse: 95 89  Resp: 18 18  Temp: 36.7 C 36.5 C  SpO2: 100% 100%    Last Pain:  Vitals:   01/26/22 1407  TempSrc: Oral  PainSc:                  Izaiyah Kleinman P Lynnleigh Soden

## 2022-01-26 NOTE — H&P (Signed)
PROVIDER:  Monico Blitz, MD   MRN: [id number] DOB: May 16, 1971 DATE OF ENCOUNTER: 12/13/2021   Subjective    Chief Complaint: No chief complaint on file.       History of Present Illness:   51 year old female who has a history of necrotizing fasciitis status supposed diverting colostomy in June 2021. Patient has a remote history of a rectal ulcer that appeared healed on colonoscopy by her gastroenterologist in August.  I saw her last in August of last year.  She was losing weight and had no appetite.  I recommended that she follow-up with gastroenterology.  She was diagnosed with colitis and treated appropriately.  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  Patient has a recent history of pancolitis as well as parastomal hernia.  She empirically received Cipro and Flagyl and her colitis resolved.  C. difficile was negative.  She developed a small bowel obstruction after that, which improved with conservative measures.  Follow-up CT scan February 2023 shows no further colitis or obstructive symptoms.  She has been seeing her gastroenterologist and her symptoms have resolved.  Has been on Questran to help with chronic diarrhea.       Review of Systems: A complete review of systems was obtained from the patient.  I have reviewed this information and discussed as appropriate with the patient.  See HPI as well for other ROS.       Medical History: History reviewed. No pertinent past medical history.   There is no problem list on file for this patient.          Past Surgical History:  Procedure Laterality Date   COLONOSCOPY       EGD       FLEXIBLE SIGMOIDOSCOPY       INCISION AND DRAINAGE ABSCESS ANAL       loop colostomy               Allergies  Allergen Reactions   Pantoprazole Unknown      Stomach pain   Penicillins Hives      Did it involve swelling of the face/tongue/throat, SOB, or low BP? N Did it involve sudden  or severe rash/hives, skin peeling, or any reaction on the inside of your mouth or nose? Y Did you need to seek medical attention at a hospital or doctor's office? N When did it last happen?  Childhood     If all above answers are "NO", may proceed with cephalosporin use. Did it involve swelling of the face/tongue/throat, SOB, or low BP? N Did it involve sudden or severe rash/hives, skin peeling, or any reaction on the inside of your mouth or nose? Y Did you need to seek medical attention at a hospital or doctor's office? N When did it last happen?  Childhood     If all above answers are "NO", may proceed with cephalosporin use.     Oxycodone Hcl Rash      No current outpatient medications on file prior to visit.    No current facility-administered medications on file prior to visit.           Family History  Problem Relation Age of Onset   Breast cancer Mother        Social History       Tobacco Use  Smoking Status Never  Smokeless Tobacco Never      Social History        Socioeconomic History  Marital status: Single  Tobacco Use   Smoking status: Never   Smokeless tobacco: Never  Vaping Use   Vaping Use: Never used  Substance and Sexual Activity   Alcohol use: Never   Drug use: Never      Objective:      Vitals:   01/26/22 0535  BP: 120/82  Pulse: 81  Resp: 18  Temp: 98.4 F (36.9 C)  SpO2: 99%      Exam Gen: NAD CV: RRR Lungs: CTA Abd: soft, parastomal hernia       Labs, Imaging and Diagnostic Testing: CT reviewed from earlier this year.  Patient has a large parastomal hernia but the defect is small.   Assessment and Plan:  Diagnoses and all orders for this visit:   Parastomal hernia without obstruction or gangrene     Patient with need for ostomy reversal.  Patient is now tolerating a diet and having good bowel function.  We were able to place a new 1 and gave her some extra supplies.  We discussed that there is a strong likelihood  that her hernia could recur after surgery.  We discussed the risk of surgery including bleeding and anastomotic dehiscence.  We discussed that if she were to have complications from surgery regarding her colon healing, she would need another ostomy.   The surgery and anatomy were described to the patient as well as the risks of surgery and the possible complications.  These include: Bleeding, deep abdominal infections and possible wound complications such as hernia and infection, damage to adjacent structures, leak of surgical connections, which can lead to other surgeries and possibly an ostomy, possible need for other procedures, such as abscess drains in radiology, possible prolonged hospital stay, possible diarrhea from removal of part of the colon, possible constipation from narcotics, possible bowel, bladder or sexual dysfunction if having rectal surgery, prolonged fatigue/weakness or appetite loss, possible early recurrence of of disease, possible complications of their medical problems such as heart disease or arrhythmias or lung problems, death (less than 1%). I believe the patient understands and wishes to proceed with the surgery.   No follow-ups on file.     Alyssa Adie, MD Colon and Rectal Surgery [facility]

## 2022-01-26 NOTE — Op Note (Signed)
01/26/2022  9:30 AM  PATIENT:  Alyssa Carey  51 y.o. female  Patient Care Team: Caryl Bis, MD as PCP - General (Family Medicine)  PRE-OPERATIVE DIAGNOSIS:  COLOSTOMY, PARASTOMAL HERNIA  POST-OPERATIVE DIAGNOSIS:  COLOSTOMY, PARASTOMAL HERNIA  PROCEDURE: LAPAROSCOPIC ASSISTED COLOSTOMY TAKEDOWN, PARTIAL COLECTOMY   Surgeon(s): Leighton Ruff, MD Michael Boston, MD  ASSISTANT: Dr Johney Maine   ANESTHESIA:   local and general  EBL: 280m  Total I/O In: 1500 [I.V.:1400; IV Piggyback:100] Out: 5256[Urine:375; Blood:200]  Delay start of Pharmacological VTE agent (>24hrs) due to surgical blood loss or risk of bleeding:  no  DRAINS: none   SPECIMEN:  Source of Specimen:  loop colostomy  DISPOSITION OF SPECIMEN:  PATHOLOGY  COUNTS:  YES  PLAN OF CARE: Admit to inpatient   PATIENT DISPOSITION:  PACU - hemodynamically stable.  INDICATION:    51y.o. 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  She has since healed and has developed a large parastomal hernia.   I recommended colostomy takedown and primary closure of her hernia:  The anatomy & physiology of the digestive tract was discussed.  The pathophysiology was discussed.  Natural history risks without surgery was discussed.   I worked to give an overview of the disease and the frequent need to have multispecialty involvement.  I feel the risks of no intervention will lead to serious problems that outweigh the operative risks; therefore, I recommended a partial colectomy to remove the pathology.  Laparoscopic & open techniques were discussed.   Risks such as bleeding, infection, abscess, leak, reoperation, possible ostomy, hernia, heart attack, death, and other risks were discussed.  I noted a good likelihood this will help address the problem.   Goals of post-operative recovery were discussed as well.    The patient expressed understanding & wished to proceed with surgery.  OR FINDINGS:    Patient had parastomal hernia containing ~50% of her small bowel without evidence of obstruction.  Stomal defect ~8x8cm   DESCRIPTION:   Informed consent was confirmed.  The patient underwent general anaesthesia without difficulty.  The patient was positioned appropriately.  Foley catheter was inserted under sterile conditions.  VTE prevention in place.  The patient's abdomen was clipped, prepped, & draped in a sterile fashion.  Surgical timeout confirmed our plan.  The patient was positioned in reverse Trendelenburg.  Abdominal entry was gained using a Varies needle in the LUQ.  Entry was clean.  I induced carbon dioxide insufflation.  An 542mlap port was placed in the RUQ.  Camera inspection revealed no injury.  Extra ports were carefully placed under direct laparoscopic visualization.  I laparoscopically reflected the greater omentum and the upper abdomen and took down the small bowel from the hernia sac. Adhesions were taken down using lap scissors.  Once all adhesions were taken down, the abdomen was desufflated.  I made an incision in the skin around the stoma and carried this down through subcutaneous tissues using electrocautery.  The loop colostomy was carefully taken down from the hernia sac using blunt dissection and cautery.  Hemostasis was achieved as we went.  The small bowel was taken down and placed back into the abdomen.  The loop colostomy was resected using a 75 mm GIA stapler.  The mesentery was divided using 3-0 silk sutures in a cutting clamp technique.  A small enterotomy was made in both ends of the colon and a 75 mm GIA stapler was introduced.  An  anastomosis was created.  The common enterotomy channel was closed using a 90 mm TA stapler.  Staple line was oversewn using interrupted 3-0 silk sutures.  Common mesenteric defect was closed using interrupted 2-0 silk sutures.  In and tied tension suture was also placed.  This was then placed back into the abdomen.  The hernia sac was  removed from the subcutaneous tissues using electrocautery.  The fascia was then closed using interrupted #1 Novafil sutures.  We then reinsufflated the abdomen and evaluated laparoscopically.  Hemostasis was good.  There was no evidence of injury to small bowel or colon.  Pelvis appeared free of inflammation.  Rectum appeared normal.  The abdomen was then desufflated.  Ports were removed.  The subcutaneous tissue was reapproximated using 2-0 Vicryl pursestring sutures.  A Telfa wick was placed in the middle of the wound and the dermal layer was reapproximated also using a 2-0 Vicryl pursestring suture.  A sterile dressing was applied.  The 5 mm port sites were closed using interrupted 4-0 Vicryl sutures and Dermabond.  A hernia belt was placed.  The patient was then awakened from anesthesia and sent to the postanesthesia care unit in stable condition.  All counts were correct per operating room staff.    An MD assistant was necessary for tissue manipulation, retraction and positioning due to the complexity of the case and hospital policies   Rosario Adie, MD  [facility]

## 2022-01-26 NOTE — Transfer of Care (Signed)
Immediate Anesthesia Transfer of Care Note  Patient: Alyssa Carey  Procedure(s) Performed: LAPAROSCOPIC COLOSTOMY TAKEDOWN  Patient Location: PACU  Anesthesia Type:General  Level of Consciousness: drowsy  Airway & Oxygen Therapy: Patient Spontanous Breathing and Patient connected to face mask oxygen  Post-op Assessment: Report given to RN and Post -op Vital signs reviewed and stable  Post vital signs: Reviewed and stable  Last Vitals:  Vitals Value Taken Time  BP 147/88 01/26/22 0951  Temp    Pulse 116 01/26/22 0955  Resp 22 01/26/22 0955  SpO2 97 % 01/26/22 0955  Vitals shown include unvalidated device data.  Last Pain:  Vitals:   01/26/22 0605  TempSrc:   PainSc: 0-No pain         Complications: No notable events documented.

## 2022-01-27 ENCOUNTER — Other Ambulatory Visit: Payer: Self-pay

## 2022-01-27 ENCOUNTER — Encounter (HOSPITAL_COMMUNITY): Payer: Self-pay | Admitting: General Surgery

## 2022-01-27 LAB — BASIC METABOLIC PANEL
Anion gap: 10 (ref 5–15)
BUN: 7 mg/dL (ref 6–20)
CO2: 22 mmol/L (ref 22–32)
Calcium: 8.3 mg/dL — ABNORMAL LOW (ref 8.9–10.3)
Chloride: 105 mmol/L (ref 98–111)
Creatinine, Ser: 0.54 mg/dL (ref 0.44–1.00)
GFR, Estimated: >60 mL/min (ref >60)
Glucose, Bld: 126 mg/dL — ABNORMAL HIGH (ref 70–99)
Potassium: 5.3 mmol/L — ABNORMAL HIGH (ref 3.5–5.1)
Sodium: 137 mmol/L (ref 135–145)

## 2022-01-27 LAB — CBC
HCT: 27.9 % — ABNORMAL LOW (ref 36.0–46.0)
Hemoglobin: 8.9 g/dL — ABNORMAL LOW (ref 12.0–15.0)
MCH: 32.7 pg (ref 26.0–34.0)
MCHC: 31.9 g/dL (ref 30.0–36.0)
MCV: 102.6 fL — ABNORMAL HIGH (ref 80.0–100.0)
Platelets: 141 10*3/uL — ABNORMAL LOW (ref 150–400)
RBC: 2.72 MIL/uL — ABNORMAL LOW (ref 3.87–5.11)
RDW: 14.1 % (ref 11.5–15.5)
WBC: 9.1 10*3/uL (ref 4.0–10.5)
nRBC: 0 % (ref 0.0–0.2)

## 2022-01-27 LAB — SURGICAL PATHOLOGY

## 2022-01-27 MED ORDER — IBUPROFEN 400 MG PO TABS
400.0000 mg | ORAL_TABLET | Freq: Four times a day (QID) | ORAL | Status: DC | PRN
  Filled 2022-01-27: qty 2

## 2022-01-27 MED ORDER — DEXTROSE-NACL 5-0.45 % IV SOLN: INTRAVENOUS | Status: DC

## 2022-01-27 MED ORDER — PHENOL 1.4 % MT LIQD
1.0000 | OROMUCOSAL | Status: DC | PRN
  Filled 2022-01-27: qty 177

## 2022-01-27 MED ORDER — DIPHENHYDRAMINE HCL 25 MG PO CAPS
25.0000 mg | ORAL_CAPSULE | ORAL | Status: DC | PRN
  Administered 2022-01-28 – 2022-02-03 (×3): 25 mg via ORAL
  Filled 2022-01-27 (×3): qty 1

## 2022-01-27 MED ORDER — HYDROCODONE-ACETAMINOPHEN 5-325 MG PO TABS
1.0000 | ORAL_TABLET | ORAL | Status: DC | PRN
  Administered 2022-01-27 – 2022-02-01 (×17): 2 via ORAL
  Administered 2022-02-01: 1 via ORAL
  Administered 2022-02-02 (×3): 2 via ORAL
  Administered 2022-02-02: 1 via ORAL
  Administered 2022-02-03 (×2): 2 via ORAL
  Filled 2022-01-27: qty 2
  Filled 2022-01-27: qty 1
  Filled 2022-01-27 (×3): qty 2
  Filled 2022-01-27: qty 1
  Filled 2022-01-27 (×6): qty 2
  Filled 2022-01-27: qty 1
  Filled 2022-01-27 (×2): qty 2
  Filled 2022-01-27: qty 1
  Filled 2022-01-27 (×9): qty 2

## 2022-01-27 MED ORDER — ACETAMINOPHEN 325 MG PO TABS
650.0000 mg | ORAL_TABLET | Freq: Four times a day (QID) | ORAL | Status: DC | PRN
Start: 2022-01-27 — End: 2022-02-03
  Administered 2022-01-28: 650 mg via ORAL
  Filled 2022-01-27: qty 2

## 2022-01-27 NOTE — Progress Notes (Signed)
1 Day Post-Op Lap colostomy closure Subjective: Had some bloody rectal discharge yesterday.  Tolerating liquids  Objective: Vital signs in last 24 hours: Temp:  [97.5 F (36.4 C)-98.7 F (37.1 C)] 98.6 F (37 C) (07/14 1011) Pulse Rate:  [83-107] 107 (07/14 1011) Resp:  [18] 18 (07/14 1011) BP: (99-137)/(66-85) 112/68 (07/14 1011) SpO2:  [97 %-100 %] 99 % (07/14 1011)   Intake/Output from previous day: 07/13 0701 - 07/14 0700 In: 4044.1 [P.O.:1440; I.V.:2504.1; IV Piggyback:100] Out: 1875 [Urine:1675; Blood:200] Intake/Output this shift: Total I/O In: 360 [P.O.:360] Out: 0    General appearance: alert and cooperative GI: soft, non-distended  Incision: no significant erythema, slight clear drainage present  Lab Results:  Recent Labs    01/26/22 0615 01/27/22 0505  WBC 3.1* 9.1  HGB 11.7* 8.9*  HCT 35.6* 27.9*  PLT 183 141*   BMET Recent Labs    01/27/22 0505  NA 137  K 5.3*  CL 105  CO2 22  GLUCOSE 126*  BUN 7  CREATININE 0.54  CALCIUM 8.3*   PT/INR No results for input(s): "LABPROT", "INR" in the last 72 hours. ABG No results for input(s): "PHART", "HCO3" in the last 72 hours.  Invalid input(s): "PCO2", "PO2"  MEDS, Scheduled  alvimopan  12 mg Oral BID   enoxaparin (LOVENOX) injection  40 mg Subcutaneous Q24H   famotidine  20 mg Oral Daily   feeding supplement  237 mL Oral BID BM   gabapentin  300 mg Oral BID   saccharomyces boulardii  250 mg Oral BID    Studies/Results: No results found.  Assessment: s/p Procedure(s): LAPAROSCOPIC COLOSTOMY TAKEDOWN Patient Active Problem List   Diagnosis Date Noted   Colostomy status ([place]) 01/26/2022   Diarrhea with dehydration 09/03/2021   Hypomagnesemia 09/02/2021   Paroxysmal SVT (supraventricular tachycardia) ([place]) 08/24/2021   Atrial flutter ([facility])    SBO (small bowel obstruction) ([place])    Malnutrition of moderate degree 08/17/2021   Dehydration 08/10/2021   Swelling of lower extremity  04/21/2021   Symptomatic anemia due to Gi bleed 04/09/2021   Hypokalemia due to excessive gastrointestinal loss of potassium 03/24/2021   Hypoalbuminemia due to protein-calorie malnutrition ([place]) 03/24/2021   Hypoglycemia 03/24/2021   Obesity (BMI 30.0-34.9) 03/24/2021   Serum total bilirubin elevated 03/24/2021   Anemia 03/24/2021   Prolonged QT interval 03/24/2021   Acute hepatitis    Alcohol use    Abnormal liver enzymes 03/23/2021   PUD (peptic ulcer disease) 12/02/2020   Acute blood loss anemia 10/06/2020   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[facility])    Elevated alkaline phosphatase level    Abdominal pain    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    Acute GI bleeding 10/05/2020   Transaminitis 10/05/2020   HTN (hypertension) 10/05/2020   Mild protein malnutrition ([place]) 10/05/2020   Stercoral ulcer of rectum 01/12/2020   Aphonia 01/12/2020   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   Vocal fold paresis, 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   Hypernatremia 01/12/2020   Anasarca 01/12/2020   Necrotizing fasciitis of pelvic region and thigh ([place]) 12/28/2019   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  Packing to be removed on POD 2 Binder on when OOB    LOS: 1 day     .Rosario Adie, MD [facility], [place]    01/27/2022 11:06 AM

## 2022-01-27 NOTE — Progress Notes (Signed)
Patient reported she felt like she is having a bowel movement upon assessment bloody secretions was noted on the bed pad, patient was instructed to get up and will assist to the bathroom, while patient was walking through the bathroom clots of blood dripping out from her. Patient was assisted in the toilet.

## 2022-01-27 NOTE — Discharge Instructions (Addendum)
ABDOMINAL SURGERY: POST OP INSTRUCTIONS  DIET: Follow a light bland diet the first 24 hours after arrival home, such as soup, liquids, crackers, etc.  Be sure to include lots of fluids daily.  Avoid fast food or heavy meals as your are more likely to get nauseated.  Do not eat any uncooked fruits or vegetables for the next 2 weeks as your colon heals. Take your usually prescribed home medications unless otherwise directed. PAIN CONTROL: Pain is best controlled by a usual combination of three different methods TOGETHER: Ice/Heat Over the counter pain medication Prescription pain medication Most patients will experience some swelling and bruising around the incisions.  Ice packs or heating pads (30-60 minutes up to 6 times a day) will help. Use ice for the first few days to help decrease swelling and bruising, then switch to heat to help relax tight/sore spots and speed recovery.  Some people prefer to use ice alone, heat alone, alternating between ice & heat.  Experiment to what works for you.  Swelling and bruising can take several weeks to resolve.   It is helpful to take an over-the-counter pain medication regularly for the first few weeks.  Choose one of the following that works best for you: Naproxen (Aleve, etc)  Two 262m tabs twice a day Ibuprofen (Advil, etc) Three 2018mtabs four times a day (every meal & bedtime) Acetaminophen (Tylenol, etc) 500-65076mour times a day (every meal & bedtime) A  prescription for pain medication (such as oxycodone, hydrocodone, etc) should be given to you upon discharge.  Take your pain medication as prescribed.  If you are having problems/concerns with the prescription medicine (does not control pain, nausea, vomiting, rash, itching, etc), please call us [phone] see if we need to switch you to a different pain medicine that will work better for you and/or control your side effect better. If you need a refill on your pain medication, please contact  your pharmacy.  They will contact our office to request authorization. Prescriptions will not be filled after 5 pm or on week-ends. Avoid getting constipated.  Between the surgery and the pain medications, it is common to experience some constipation.  Increasing fluid intake and taking a fiber supplement (such as Metamucil, Citrucel, FiberCon, MiraLax, etc) 1-2 times a day regularly will usually help prevent this problem from occurring.  A mild laxative (prune juice, Milk of Magnesia, MiraLax, etc) should be taken according to package directions if there are no bowel movements after 48 hours.   Watch out for diarrhea.  If you have many loose bowel movements, simplify your diet to bland foods & liquids for a few days.  Stop any stool softeners and decrease your fiber supplement.  Switching to mild anti-diarrheal medications (Kayopectate, Pepto Bismol) can help.  If this worsens or does not improve, please call us.[place] / shower every day.  You may shower over the incision / wound.  Avoid baths until the skin is fully healed.  Continue to shower over incision(s) after the dressing is off. Change your dressing daily and as needed.  Wash with soap and water.  Cover with gauze and tape ACTIVITIES as tolerated:   You may resume regular (light) daily activities beginning the next day--such as daily self-care, walking, climbing stairs--gradually increasing activities as tolerated.  If you can walk 30 minutes without difficulty, it is safe to try more intense activity such as jogging, treadmill, bicycling, low-impact aerobics, swimming, etc. Save the most intensive and strenuous activity  for last such as sit-ups, heavy lifting, contact sports, etc  Refrain from any heavy lifting or straining until you are off narcotics for pain control.   DO NOT PUSH THROUGH PAIN.  Let pain be your guide: If it hurts to do something, don't do it.  Pain is your body warning you to avoid that activity for another week until the pain  goes down. You may drive when you are no longer taking prescription pain medication, you can comfortably wear a seatbelt, and you can safely maneuver your car and apply brakes. You may have sexual intercourse when it is comfortable.  FOLLOW UP in our office Please call [facility] at [phone] to set up an appointment to see your surgeon in the office for a follow-up appointment approximately 1-2 weeks after your surgery. Make sure that you call for this appointment the day you arrive home to insure a convenient appointment time. 10. IF YOU HAVE DISABILITY OR FAMILY LEAVE FORMS, BRING THEM TO THE OFFICE FOR PROCESSING.  DO NOT GIVE THEM TO YOUR DOCTOR.   WHEN TO CALL us [phone]: Poor pain control Reactions / problems with new medications (rash/itching, nausea, etc)  Fever over 101.5 F (38.5 C) Inability to urinate Nausea and/or vomiting Worsening swelling or bruising Continued bleeding from incision. Increased pain, redness, or drainage from the incision  The clinic staff is available to answer your questions during regular business hours (8:30am-5pm).  Please don't hesitate to call and ask to speak to one of our nurses for clinical concerns.   A surgeon from [facility] is always on call at the hospitals   If you have a medical emergency, go to the nearest emergency room or call 911.    [facility], [place], [place], [place], [place]  [address] ? MAIN: [phone] ? TOLL FREE: [phone] ? FAX [phone] [email or web address]

## 2022-01-28 LAB — BASIC METABOLIC PANEL
Anion gap: 6 (ref 5–15)
BUN: 10 mg/dL (ref 6–20)
CO2: 24 mmol/L (ref 22–32)
Calcium: 8.7 mg/dL — ABNORMAL LOW (ref 8.9–10.3)
Chloride: 107 mmol/L (ref 98–111)
Creatinine, Ser: 0.5 mg/dL (ref 0.44–1.00)
GFR, Estimated: >60 mL/min (ref >60)
Glucose, Bld: 106 mg/dL — ABNORMAL HIGH (ref 70–99)
Potassium: 4.1 mmol/L (ref 3.5–5.1)
Sodium: 137 mmol/L (ref 135–145)

## 2022-01-28 LAB — CBC
HCT: 25.3 % — ABNORMAL LOW (ref 36.0–46.0)
Hemoglobin: 8 g/dL — ABNORMAL LOW (ref 12.0–15.0)
MCH: 32.8 pg (ref 26.0–34.0)
MCHC: 31.6 g/dL (ref 30.0–36.0)
MCV: 103.7 fL — ABNORMAL HIGH (ref 80.0–100.0)
Platelets: 138 10*3/uL — ABNORMAL LOW (ref 150–400)
RBC: 2.44 MIL/uL — ABNORMAL LOW (ref 3.87–5.11)
RDW: 13.6 % (ref 11.5–15.5)
WBC: 5.6 10*3/uL (ref 4.0–10.5)
nRBC: 0 % (ref 0.0–0.2)

## 2022-01-28 MED ORDER — MELATONIN 3 MG PO TABS
3.0000 mg | ORAL_TABLET | Freq: Every evening | ORAL | Status: DC | PRN
  Administered 2022-01-28 – 2022-01-31 (×2): 3 mg via ORAL
  Filled 2022-01-28 (×2): qty 1

## 2022-01-28 NOTE — Progress Notes (Signed)
2 Days Post-Op   Subjective/Chief Complaint: Some additional brbpr, tol fulls, otherwise fine   Objective: Vital signs in last 24 hours: Temp:  [98.4 F (36.9 C)-99.5 F (37.5 C)] 98.4 F (36.9 C) (07/15 0535) Pulse Rate:  [97-109] 97 (07/15 0535) Resp:  [18] 18 (07/15 0535) BP: (112-135)/(66-95) 135/95 (07/15 0535) SpO2:  [92 %-99 %] 96 % (07/15 0535) Last BM Date : 01/26/22  Intake/Output from previous day: 07/14 0701 - 07/15 0700 In: 2610.5 [P.O.:1680; I.V.:930.5] Out: 0  Intake/Output this shift: No intake/output data recorded.  General nad Cv regular Pulm effort normal GI soft nondistended, packing removed today, no wound infection  Lab Results:  Recent Labs    01/27/22 0505 01/28/22 0445  WBC 9.1 5.6  HGB 8.9* 8.0*  HCT 27.9* 25.3*  PLT 141* 138*   BMET Recent Labs    01/27/22 0505 01/28/22 0445  NA 137 137  K 5.3* 4.1  CL 105 107  CO2 22 24  GLUCOSE 126* 106*  BUN 7 10  CREATININE 0.54 0.50  CALCIUM 8.3* 8.7*   PT/INR No results for input(s): "LABPROT", "INR" in the last 72 hours. ABG No results for input(s): "PHART", "HCO3" in the last 72 hours.  Invalid input(s): "PCO2", "PO2"  Studies/Results: No results found.  Anti-infectives: Anti-infectives (From admission, onward)    Start     Dose/Rate Route Frequency Ordered Stop   01/26/22 0600  cefoTEtan (CEFOTAN) 2 g in sodium chloride 0.9 % 100 mL IVPB        2 g 200 mL/hr over 30 Minutes Intravenous On call to O.R. 01/26/22 0532 01/26/22 0736       Assessment/Plan: POD 2 loop colostomy reversal -continued brbpr, hct a little lower, will hold lovenox today, keep here, recheck in am, should resolve on own -she would like to stay on fulls -change outer dressing prn and binder on when oob  Rolm Bookbinder 01/28/2022

## 2022-01-29 ENCOUNTER — Encounter (HOSPITAL_COMMUNITY): Payer: Self-pay | Admitting: General Surgery

## 2022-01-29 DIAGNOSIS — Z933 Colostomy status: Secondary | ICD-10-CM

## 2022-01-29 DIAGNOSIS — F10931 Alcohol use, unspecified with withdrawal delirium: Secondary | ICD-10-CM

## 2022-01-29 DIAGNOSIS — I10 Essential (primary) hypertension: Secondary | ICD-10-CM

## 2022-01-29 DIAGNOSIS — Z789 Other specified health status: Secondary | ICD-10-CM

## 2022-01-29 DIAGNOSIS — F10939 Alcohol use, unspecified with withdrawal, unspecified: Secondary | ICD-10-CM

## 2022-01-29 DIAGNOSIS — R9431 Abnormal electrocardiogram [ECG] [EKG]: Secondary | ICD-10-CM

## 2022-01-29 LAB — CBC
HCT: 28.8 % — ABNORMAL LOW (ref 36.0–46.0)
Hemoglobin: 9.1 g/dL — ABNORMAL LOW (ref 12.0–15.0)
MCH: 32.5 pg (ref 26.0–34.0)
MCHC: 31.6 g/dL (ref 30.0–36.0)
MCV: 102.9 fL — ABNORMAL HIGH (ref 80.0–100.0)
Platelets: 169 10*3/uL (ref 150–400)
RBC: 2.8 MIL/uL — ABNORMAL LOW (ref 3.87–5.11)
RDW: 13.2 % (ref 11.5–15.5)
WBC: 5.9 10*3/uL (ref 4.0–10.5)
nRBC: 0 % (ref 0.0–0.2)

## 2022-01-29 LAB — BASIC METABOLIC PANEL
Anion gap: 8 (ref 5–15)
BUN: 10 mg/dL (ref 6–20)
CO2: 23 mmol/L (ref 22–32)
Calcium: 9 mg/dL (ref 8.9–10.3)
Chloride: 104 mmol/L (ref 98–111)
Creatinine, Ser: 0.56 mg/dL (ref 0.44–1.00)
GFR, Estimated: >60 mL/min (ref >60)
Glucose, Bld: 104 mg/dL — ABNORMAL HIGH (ref 70–99)
Potassium: 3.6 mmol/L (ref 3.5–5.1)
Sodium: 135 mmol/L (ref 135–145)

## 2022-01-29 LAB — URINALYSIS, ROUTINE W REFLEX MICROSCOPIC
Bilirubin Urine: NEGATIVE
Glucose, UA: NEGATIVE mg/dL
Hgb urine dipstick: NEGATIVE
Ketones, ur: 5 mg/dL — AB
Nitrite: NEGATIVE
Protein, ur: 30 mg/dL — AB
Specific Gravity, Urine: 1.032 — ABNORMAL HIGH (ref 1.005–1.030)
pH: 5 (ref 5.0–8.0)

## 2022-01-29 LAB — MRSA NEXT GEN BY PCR, NASAL: MRSA by PCR Next Gen: NOT DETECTED

## 2022-01-29 MED ORDER — CHLORDIAZEPOXIDE HCL 25 MG PO CAPS
25.0000 mg | ORAL_CAPSULE | ORAL | Status: AC
  Administered 2022-02-01 (×2): 25 mg via ORAL
  Filled 2022-01-29 (×2): qty 1

## 2022-01-29 MED ORDER — ORAL CARE MOUTH RINSE: 15.0000 mL | OROMUCOSAL | Status: DC | PRN

## 2022-01-29 MED ORDER — TRAZODONE HCL 50 MG PO TABS
100.0000 mg | ORAL_TABLET | Freq: Every evening | ORAL | Status: DC | PRN
Start: 2022-01-29 — End: 2022-02-03
  Administered 2022-01-30 – 2022-02-02 (×4): 100 mg via ORAL
  Filled 2022-01-29 (×4): qty 2

## 2022-01-29 MED ORDER — LORAZEPAM 2 MG/ML IJ SOLN
1.0000 mg | INTRAMUSCULAR | Status: AC | PRN
  Administered 2022-01-29: 4 mg via INTRAVENOUS
  Filled 2022-01-29: qty 1
  Filled 2022-01-29: qty 2

## 2022-01-29 MED ORDER — CHLORDIAZEPOXIDE HCL 25 MG PO CAPS
25.0000 mg | ORAL_CAPSULE | Freq: Four times a day (QID) | ORAL | Status: AC
  Administered 2022-01-29 – 2022-01-30 (×6): 25 mg via ORAL
  Filled 2022-01-29 (×5): qty 1

## 2022-01-29 MED ORDER — CHLORHEXIDINE GLUCONATE CLOTH 2 % EX PADS
6.0000 | MEDICATED_PAD | Freq: Every day | CUTANEOUS | Status: DC
  Administered 2022-01-29 – 2022-02-02 (×4): 6 via TOPICAL

## 2022-01-29 MED ORDER — FOLIC ACID 1 MG PO TABS
1.0000 mg | ORAL_TABLET | Freq: Every day | ORAL | Status: DC
  Administered 2022-01-29 – 2022-02-03 (×6): 1 mg via ORAL
  Filled 2022-01-29 (×6): qty 1

## 2022-01-29 MED ORDER — THIAMINE HCL 100 MG PO TABS
100.0000 mg | ORAL_TABLET | Freq: Every day | ORAL | Status: DC
  Administered 2022-01-29 – 2022-02-03 (×6): 100 mg via ORAL
  Filled 2022-01-29 (×6): qty 1

## 2022-01-29 MED ORDER — DEXMEDETOMIDINE HCL IN NACL 200 MCG/50ML IV SOLN
0.0000 ug/kg/h | INTRAVENOUS | Status: DC
  Administered 2022-01-29 – 2022-01-30 (×3): 0.4 ug/kg/h via INTRAVENOUS
  Filled 2022-01-29 (×3): qty 50

## 2022-01-29 MED ORDER — CHLORDIAZEPOXIDE HCL 25 MG PO CAPS
25.0000 mg | ORAL_CAPSULE | Freq: Every day | ORAL | Status: AC
Start: 2022-02-02 — End: 2022-02-02
  Administered 2022-02-02: 25 mg via ORAL
  Filled 2022-01-29: qty 1

## 2022-01-29 MED ORDER — THIAMINE HCL 100 MG/ML IJ SOLN: 100.0000 mg | Freq: Every day | INTRAMUSCULAR | Status: DC

## 2022-01-29 MED ORDER — LORAZEPAM 1 MG PO TABS
1.0000 mg | ORAL_TABLET | ORAL | Status: AC | PRN
  Administered 2022-01-29: 2 mg via ORAL
  Administered 2022-01-30 – 2022-01-31 (×6): 1 mg via ORAL
  Filled 2022-01-29 (×6): qty 1

## 2022-01-29 MED ORDER — CHLORDIAZEPOXIDE HCL 25 MG PO CAPS
25.0000 mg | ORAL_CAPSULE | Freq: Three times a day (TID) | ORAL | Status: AC
  Administered 2022-01-31 (×3): 25 mg via ORAL
  Filled 2022-01-29 (×3): qty 1

## 2022-01-29 MED ORDER — ADULT MULTIVITAMIN W/MINERALS CH
1.0000 | ORAL_TABLET | Freq: Every day | ORAL | Status: DC
  Administered 2022-01-29 – 2022-02-03 (×6): 1 via ORAL
  Filled 2022-01-29 (×6): qty 1

## 2022-01-29 MED ORDER — LORAZEPAM 2 MG/ML IJ SOLN
2.0000 mg | Freq: Once | INTRAMUSCULAR | Status: AC
  Administered 2022-01-29: 2 mg via INTRAVENOUS
  Filled 2022-01-29: qty 1

## 2022-01-29 MED ORDER — LORAZEPAM 2 MG/ML IJ SOLN: 2.0000 mg | Freq: Once | INTRAMUSCULAR | Status: DC

## 2022-01-29 NOTE — Progress Notes (Signed)
Patient ID: Alyssa Carey, female   DOB: October 04, 1970, 51 y.o.   MRN: [id number] Patient with acute mental status change, trying to leave. Not oriented. Apparently has etoh history.  Given ativan, start ciwa, check labs and apppreicate TRH evaluation Will need IVC for now

## 2022-01-29 NOTE — Assessment & Plan Note (Signed)
Patient reports daily beer use.  Here, she has been somewhat tachycardic the last few days, but didn't complain of withdrawals.  Today had quite sudden onset agitation, disorganized thinking, tremor.    Tachycardic, restless and pacing.  No focal weakness, no focal numbness, no speech change. Cranial nerves normal.  No hallucinations.  No somnolence.  She takes Depakote but denies prior history schizophrenia or Bipolar and I do not see chart history of either.    Clinically no signs of stroke or hepatic encephalopathy.  Post-op delirium seems less likely than simple alcohol withdrawal.  - Check ECG to evaluate QTc interval - Thiamine and folate - Start Librium taper - Start CIWA scoring and as needed lorazepam - Given her degree of agitation now, would have low threshold to seek CCM consult for phenobarbital or Precedex

## 2022-01-29 NOTE — Progress Notes (Addendum)
3 Days Post-Op   Subjective/Chief Complaint: Small amount brb this am, cbc pending, she had some nausea yesterday, having bowel function   Objective: Vital signs in last 24 hours: Temp:  [98.4 F (36.9 C)-99.1 F (37.3 C)] 98.4 F (36.9 C) (07/16 0442) Pulse Rate:  [100-107] 100 (07/16 0442) Resp:  [18] 18 (07/16 0442) BP: (106-114)/(81-91) 114/91 (07/16 0442) SpO2:  [96 %-99 %] 96 % (07/16 0442) Weight:  [84.6 kg] 84.6 kg (07/16 0500) Last BM Date : 01/26/22  Intake/Output from previous day: 07/15 0701 - 07/16 0700 In: 1920 [P.O.:1920] Out: -  Intake/Output this shift: No intake/output data recorded.  Ab soft approp tender wound with some drainage as expected  Lab Results:  Recent Labs    01/27/22 0505 01/28/22 0445  WBC 9.1 5.6  HGB 8.9* 8.0*  HCT 27.9* 25.3*  PLT 141* 138*   BMET Recent Labs    01/28/22 0445 01/29/22 0405  NA 137 135  K 4.1 3.6  CL 107 104  CO2 24 23  GLUCOSE 106* 104*  BUN 10 10  CREATININE 0.50 0.56  CALCIUM 8.7* 9.0   PT/INR No results for input(s): "LABPROT", "INR" in the last 72 hours. ABG No results for input(s): "PHART", "HCO3" in the last 72 hours.  Invalid input(s): "PCO2", "PO2"  Studies/Results: No results found.  Anti-infectives: Anti-infectives (From admission, onward)    Start     Dose/Rate Route Frequency Ordered Stop   01/26/22 0600  cefoTEtan (CEFOTAN) 2 g in sodium chloride 0.9 % 100 mL IVPB        2 g 200 mL/hr over 30 Minutes Intravenous On call to O.R. 01/26/22 0532 01/26/22 0736       Assessment/Plan: POD 3 loop colostomy reversal -continued small amt brbpr, cbc pending, will keep here 24 hours plus didn't do great with dinner (although ate spaghetti with sauce), recheck in am, should resolve on own -change outer dressing prn and binder on when oob -hopefully home am   Rolm Bookbinder 01/29/2022  Addendum: hb actually up today, will not recheck now unless needed

## 2022-01-29 NOTE — Progress Notes (Signed)
Attempted to call and give transfer report to Nurse in ICU.  Was told she was busy and could not take report at this time. Secretary took my phone number to  have nurse call me back.

## 2022-01-29 NOTE — Assessment & Plan Note (Signed)
Chart history. BP mostly controlled here, somewhat elevated today.   Not on meds at home

## 2022-01-29 NOTE — Consult Note (Signed)
Initial Consultation Note   Patient: Alyssa Carey NAT:[id number] DOB: 1971-03-28 PCP: Alyssa Bis, MD DOA: 01/26/2022 DOS: the patient was seen and examined on 01/29/2022 Primary service: Alyssa Ruff, MD  Referring physician: Dr. Donne Carey Reason for consult: Change in mental status  Assessment/Plan: Assessment and Plan: * Colostomy status ([place])    Alcohol withdrawal ([place]) Patient reports daily beer use.  Here, she has been somewhat tachycardic the last few days, but didn't complain of withdrawals.  Today had quite sudden onset agitation, disorganized thinking, tremor.    Tachycardic, restless and pacing.  No focal weakness, no focal numbness, no speech change. Cranial nerves normal.  No hallucinations.  No somnolence.  She takes Depakote but denies prior history schizophrenia or Bipolar and I do not see chart history of either.    Clinically no signs of stroke or hepatic encephalopathy.  Post-op delirium seems less likely than simple alcohol withdrawal.  - Check ECG to evaluate QTc interval - Thiamine and folate - Start Librium taper - Start CIWA scoring and as needed lorazepam - Given her degree of agitation now, would have low threshold to seek CCM consult for phenobarbital or Precedex   Obesity, Class III, BMI 40-49.9 (morbid obesity) (HCC) BMI 40.3  Prolonged QT interval This is an old history - Check ECG  HTN (hypertension) Chart history. BP mostly controlled here, somewhat elevated today.   Not on meds at home       [facility] will continue to follow the patient.  HPI: Alyssa Carey is a 51 y.o. F with history alcoholism and numerous admissions for alcoholic hepatitis, electrolyte disturbance and withdrawal, then necrotizing fasciitis of the gluteal cleft in 2021 requiring operative debridement and colostomy who was admitted for colostomy takedown and for whom we are consulted for alcohol withdrawal.    7/13: Admitted taken to the OR for colostomy  takedown 7/4-15: Uneventful post-op course 7/16: In afternoon, patient became agitated and disoriented, hospitalists consulted for new change in mental status   Review of Systems: Review of Systems  Constitutional:  Negative for fever and malaise/fatigue.  Gastrointestinal:  Positive for blood in stool (scant). Negative for abdominal pain.  Genitourinary:  Negative for dysuria, flank pain, frequency, hematuria and urgency.  Neurological:  Positive for tingling, tremors and weakness. Negative for dizziness, sensory change, speech change, focal weakness, seizures, loss of consciousness and headaches.  Psychiatric/Behavioral:  The patient is nervous/anxious.   All other systems reviewed and are negative.     Past Medical History:  Diagnosis Date   Alcohol abuse    HTN (hypertension) 10/05/2020   Necrotizing fasciitis [facility])    Past Surgical History:  Procedure Laterality Date   BIOPSY  10/07/2020   Procedure: BIOPSY;  Surgeon: Daneil Dolin, MD;  [facility];  Service: Endoscopy;;   COLONOSCOPY WITH PROPOFOL N/A 10/07/2020   single healing rectal ulcer in distal rectum with surrounding mucosal friability and markedly abnormal proximal colon with ulceration s/p biopsies.   COLONOSCOPY WITH PROPOFOL  02/22/2021   Procedure: COLONOSCOPY WITH PROPOFOL;  Surgeon: [name], DO;  [facility];  Service: Endoscopy;;   COLOSTOMY TAKEDOWN N/A 01/26/2022   Procedure: LAPAROSCOPIC COLOSTOMY TAKEDOWN;  Surgeon: Alyssa Ruff, MD;  [facility];  Service: General;  Laterality: N/A;  laparoscopic assisted colostomy reversal and partial colectomy   ESOPHAGOGASTRODUODENOSCOPY (EGD) WITH PROPOFOL N/A 10/07/2020   non-bleeding gastric ulcer . Pathology with H.pylori negative   ESOPHAGOGASTRODUODENOSCOPY (EGD) WITH PROPOFOL N/A 12/27/2020   Procedure:  ESOPHAGOGASTRODUODENOSCOPY (EGD) WITH PROPOFOL;  Surgeon: [name], DO;  [facility];  Service:  Endoscopy;  Laterality: N/A;  1:00pm   FLEXIBLE SIGMOIDOSCOPY N/A 01/11/2020   Procedure: FLEXIBLE SIGMOIDOSCOPY;  Surgeon: Carol Ada, MD;  [facility];  Service: Gastroenterology;  Laterality: N/A;   INCISION AND DRAINAGE PERIRECTAL ABSCESS N/A 12/25/2019   Procedure: IRRIGATION AND DEBRIDEMENT BUTTOCKS, LAP LOOP COLOSTOMY;  Surgeon: Greer Pickerel, MD;  [facility];  Service: General;  Laterality: N/A;   IRRIGATION AND DEBRIDEMENT ABSCESS N/A 12/23/2019   Procedure: EXCISION AND DEBRIDEMENT LEFT BUTTOCK AND PERINEUM;  Surgeon: Clovis Riley, MD;  [facility];  Service: General;  Laterality: N/A;   LAPAROSCOPIC LOOP COLOSTOMY N/A 12/25/2019   Procedure: LAPAROSCOPIC LOOP COLOSTOMY;  Surgeon: Greer Pickerel, MD;  [facility];  Service: General;  Laterality: N/A;   TONSILLECTOMY     Social History:  reports that she has never smoked. She has never used smokeless tobacco. She reports that she does not currently use alcohol. She reports that she does not currently use drugs.  Allergies  Allergen Reactions   Pantoprazole     Stomach pain   Penicillins Hives    Did it involve swelling of the face/tongue/throat, SOB, or low BP? N Did it involve sudden or severe rash/hives, skin peeling, or any reaction on the inside of your mouth or nose? Y Did you need to seek medical attention at a hospital or doctor's office? N When did it last happen?  Childhood     If all above answers are "NO", may proceed with cephalosporin use.    Oxycodone Hcl Rash    Family History  Problem Relation Age of Onset   Colon polyps Mother        does not believe adenomas   Arrhythmia Mother    Colon cancer Neg Hx     Prior to Admission medications   Medication Sig Start Date End Date Taking? Authorizing Provider  acetaminophen (TYLENOL) 325 MG tablet Take 325 mg by mouth every 6 (six) hours as needed for mild pain.   Yes [provider]  atorvastatin (LIPITOR) 20 MG tablet  Take 20 mg by mouth daily. 11/12/21  Yes [provider]  cholecalciferol (VITAMIN D3) 25 MCG (1000 UNIT) tablet Take 1,000 Units by mouth daily.   Yes [provider]  famotidine (PEPCID) 20 MG tablet Take 20 mg by mouth daily. 11/11/21  Yes [provider]  hydrOXYzine (ATARAX) 25 MG tablet Take 1 tablet (25 mg total) by mouth 3 (three) times daily as needed for itching. 12/08/21 03/08/22 Yes Carver, Charles K, DO  ibuprofen (ADVIL) 800 MG tablet Take 800 mg by mouth every 8 (eight) hours as needed for moderate pain.   Yes [provider]  Nutritional Supplements (BOOST PO) Take 1 Bottle by mouth 4 (four) times daily.   Yes [provider]  psyllium (HYDROCIL/METAMUCIL) 95 % PACK Take 1 packet by mouth daily. Patient taking differently: Take 1 packet by mouth daily as needed for mild constipation. 08/28/21  Yes Tat, Shanon Brow, MD  vitamin C (ASCORBIC ACID) 500 MG tablet Take 500 mg by mouth daily.   Yes [provider]  naproxen (NAPROSYN) 500 MG tablet Take 1 tablet (500 mg total) by mouth 2 (two) times daily with a meal. 01/11/22   Noemi Chapel, MD    Physical Exam: Vitals:   01/29/22 0500 01/29/22 1308 01/29/22 1523 01/29/22 1550  BP:  103/75 (!) 161/112 Marland Kitchen)  136/97  Pulse:  (!) 106 (!) 127 (!) 129  Resp:  17 18 18   Temp:  97.9 F (36.6 C) 98.4 F (36.9 C) 98.7 F (37.1 C)  TempSrc:  Oral Oral Oral  SpO2:  100% 100% 97%  Weight: 84.6 kg     Height:       Obese adult female, sitting in recliner, restless and fidgety, makes eye contact and follows commands. Tachycardic, regular, no murmurs, no LE edema Normal respiratory rate and rhythm, lungs clear without rales or wheezing Abdomen tender appears normal for post op, incision clean dry and intact Pupils are 3 mm and reactive to 2 mm.  Extraocular movements are intact, but I note some ataxic eye movements, no real nystagmus.  Cranial nerve 5 is within normal limits.  Cranial nerve 7 is  symmetrical.  Cranial nerve 8 is within normal limits.  Cranial nerves 9 and 10 reveal equal palate elevation.  Cranial nerve 11 reveals sternocleidomastoid strong.  Cranial nerve 12 is midline.  Motor strength testing is 5/5 in the upper and lower extremities bilaterally with normal motor, tone and bulk. Sensory examination is intact to light touch.   Finger-to-nose testing is symmetrically abnormal. The patient is oriented to self, place, situation, and date.  Speech is fluent.  She is somewhat easily distracted and her answers appear unreliable and she appears to have trouble attending to my questions at times.        Data Reviewed:  Basic metabolic panel reviewed and normal, hemogram normal, shows some macrocytosis.      Primary team communication: Spoke with Dr. Donne Carey Thank you very much for involving [place] in the care of your patient.  Author: Edwin Dada, MD 01/29/2022 4:23 PM  For on call review [email or web address].

## 2022-01-29 NOTE — Hospital Course (Signed)
Alyssa Carey is a 51 y.o. F with history alcoholism and numerous admissions for alcoholic hepatitis, electrolyte disturbance and withdrawal, then necrotizing fasciitis of the gluteal cleft in 2021 requiring operative debridement and colostomy who was admitted for colostomy takedown and for whom we are consulted for alcohol withdrawal.    7/13: Admitted taken to the OR for colostomy takedown 7/4-15: Uneventful post-op course 7/16: In afternoon, patient became agitated and disoriented, hospitalists consulted for new change in mental status

## 2022-01-29 NOTE — Progress Notes (Signed)
Patient ID: Alyssa Carey, female   DOB: 1970-10-22, 51 y.o.   MRN: [id number] Tachy to 140s with elevated ciwa score on ativan.  Agree with Dr Loleta Books that she needs more monitoring and precedex seems reasonable. I have discussed with icu team who will see her and have written transfer orders.

## 2022-01-29 NOTE — Progress Notes (Signed)
eLink Physician-Brief Progress Note Patient Name: Alyssa Carey DOB: [birth year] MRN: [id number]   Date of Service  01/29/2022  HPI/Events of Note  51 year old woman with recent post op status and now in Alcohol withdrawal. Has been seen by CCM MD. Planned for ativan, and precedex if needed. Has just arrived to ICU. Mild tachycardia. No distress and otherwise pleasant and taking to Rns, being changed in the ICU.   eICU Interventions  Orders in as per CCM Call E link if needed      Intervention Category Major Interventions: Delirium, psychosis, severe agitation - evaluation and management Evaluation Type: New Patient Evaluation  Margaretmary Lombard 01/29/2022, 8:58 PM

## 2022-01-29 NOTE — Assessment & Plan Note (Signed)
This is an old history - Check ECG

## 2022-01-29 NOTE — Progress Notes (Signed)
   01/29/22 1523  Assess: MEWS Score  Temp 98.4 F (36.9 C)  BP (!) 161/112  MAP (mmHg) 123  Pulse Rate (!) 127  Resp 18  SpO2 100 %  O2 Device Room Air  Assess: MEWS Score  MEWS Temp 0  MEWS Systolic 0  MEWS Pulse 2  MEWS RR 0  MEWS LOC 0  MEWS Score 2  MEWS Score Color Yellow  Assess: if the MEWS score is Yellow or Red  Were vital signs taken at a resting state? Yes  Focused Assessment Change from prior assessment (see assessment flowsheet)  Does the patient meet 2 or more of the SIRS criteria? No  MEWS guidelines implemented *See Row Information* Yes  Treat  MEWS Interventions Administered scheduled meds/treatments;Administered prn meds/treatments;Escalated (See documentation below)  Pain Scale 0-10  Pain Score 4  Pain Type Surgical pain  Pain Location Abdomen  Take Vital Signs  Increase Vital Sign Frequency  Yellow: Q 2hr X 2 then Q 4hr X 2, if remains yellow, continue Q 4hrs  Escalate  MEWS: Escalate Yellow: discuss with charge nurse/RN and consider discussing with provider and RRT  Notify: Provider  Provider Name/Title Danley Danker  Date Provider Notified 01/29/22  Time Provider Notified 0277  Method of Notification Call  Notification Reason Other (Comment) (Behaviroal issues)  Provider response En route  Date of Provider Response 01/29/22  Time of Provider Response 1545  Document  Patient Outcome Stabilized after interventions  Assess: SIRS CRITERIA  SIRS Temperature  0  SIRS Pulse 1  SIRS Respirations  0  SIRS WBC 0  SIRS Score Sum  1   Pt. Came of room very upset asking staff to call 911. Pt. Phone was in her hand with the volume sturned up loud that nurse and others had trouble hearing pt. Pt. Into room and close the door hard. Nurse enter behind pt. To see what was wrong. Pt. To nurse "get the fuck out of my room, what do you want?' Nurse was very confused and continue to talk with pt. To see what was going on. Pt. Left room trying to leave. Nurse  didn't want pt. To leave but pt. Was not acting like herself and her eyes was wide and she was rumbling that nurse could not understand pt. Charge stepped in and pt. Began asking her to call 911. Charge kept. Pt. From getting on elevator. Pt. Had her son on the phone. Nurse began to speak with pt. Son who reported that his mom get like this when she began withdrawing or have  UTI. He reported excessive drug up to 2 packs a beer a day at home. Nurse pt. MD who responded immediately and order 2 of ativan to get pt. Calmed down. Pt. Was more redirectable at this time. General surgery and hospitalitist came by to see pt. New orders placed, pt. Is now IVC. Sitter place at pt. Side, all prohibited items removed from pt. Room. Pt. Son update. Will continue to monitor.

## 2022-01-29 NOTE — Assessment & Plan Note (Signed)
BMI 40.3

## 2022-01-29 NOTE — Consult Note (Signed)
NAME:  Alyssa Carey, MRN:  [id number], DOB:  [birth year], LOS: 3 ADMISSION DATE:  01/26/2022, CONSULTATION DATE:  01/29/2022  REFERRING MD:  Donne Hazel, CCS, CHIEF COMPLAINT: Delirium  History of Present Illness:  51 year old with a history of alcoholism and colostomy in 2021 underwent elective colostomy takedown on 7/13, uneventful surgery.  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  History is obtained from chart review and speaking to cousin [name] at the bedside Small amount of bright red blood PR reported 7/16  in the afternoon 7/16 she became agitated and disoriented , CIWA protocol was started, hospitalist consulted.  She was reassessed around 7 PM, received 8 mg of Ativan per RN, CIWA score was still elevated at 37, tachycardic to 140s, hence PCCM consulted for transfer to ICU  Pertinent  Medical History  EtOH hepatitis, withdrawal Necrotizing fasciitis, gluteal cleft 2021 requiring colostomy  Significant Hospital Events: Including procedures, antibiotic start and stop dates in addition to other pertinent events   7/13 colostomy takedown , partial colectomy  Interim History / Subjective:  Afebrile, tachycardic, on room air  Objective   Blood pressure (!) 128/95, pulse (!) 140, temperature 97.6 F (36.4 C), temperature source Oral, resp. rate 18, height 4' 9"  (1.448 m), weight 84.6 kg, last menstrual period 11/30/2021, SpO2 100 %.        Intake/Output Summary (Last 24 hours) at 01/29/2022 1945 Last data filed at 01/29/2022 1800 Gross per 24 hour  Intake 1380 ml  Output 100 ml  Net 1280 ml   Filed Weights   01/26/22 0535 01/29/22 0500  Weight: 76.7 kg 84.6 kg    Examination: General: Middle-age woman, lying supine in bed, avoids eye contact, talking to herself HENT: No pallor, icterus, no JVD Lungs: Clear breath sounds bilateral, no accessory muscle use Cardiovascular: S1-S2 tacky, regular Abdomen: Soft, nontender,  incisions covered, no hepatosplenomegaly Extremities: No deformity, no edema Neuro: Oriented to self, not to place or person, able to identify her cousin [name], tremors  Labs show mild hypokalemia, stable anemia, no leukocytosis  Resolved Hospital Problem list     Assessment & Plan:  Acute delirium, likely delirium tremens rather than postop delirium. She is on Depakote but no prior history of schizophrenia or bipolar Cousin reports that she drinks heavily, both hard liquor and beer She has required 8 mg of Ativan in the last 5 hours, reasonable to transfer to ICU for monitoring and use Precedex as needed  -CIWA protocol which would allow higher dose of Ativan 1 to 4 mg every hour -Use Precedex in addition if remains agitated or high CIWA score -Hold off on phenobarb for now -Check magnesium   Postop care per surgery, no evidence of sepsis  Best Practice (right click and "Reselect all SmartList Selections" daily)   Diet/type: Regular consistency (see orders) DVT prophylaxis: SCD GI prophylaxis: N/A Lines: N/A Foley:  N/A Code Status:  full code Last date of multidisciplinary goals of care discussion [NA]  Labs   CBC: Recent Labs  Lab 01/26/22 0615 01/27/22 0505 01/28/22 0445 01/29/22 0822  WBC 3.1* 9.1 5.6 5.9  HGB 11.7* 8.9* 8.0* 9.1*  HCT 35.6* 27.9* 25.3* 28.8*  MCV 100.3* 102.6* 103.7* 102.9*  PLT 183 141* 138* 751    Basic Metabolic Panel: Recent Labs  Lab 01/27/22 0505 01/28/22 0445 01/29/22 0405  NA 137 137 135  K 5.3* 4.1 3.6  CL 105 107 104  CO2 22 24 23  GLUCOSE 126* 106* 104*  BUN 7 10 10   CREATININE 0.54 0.50 0.56  CALCIUM 8.3* 8.7* 9.0   GFR: Estimated Creatinine Clearance: 77.4 mL/min (by C-G formula based on SCr of 0.56 mg/dL). Recent Labs  Lab 01/26/22 0615 01/27/22 0505 01/28/22 0445 01/29/22 0822  WBC 3.1* 9.1 5.6 5.9    Liver Function Tests: No results for input(s): "AST", "ALT", "ALKPHOS", "BILITOT", "PROT", "ALBUMIN" in  the last 168 hours. No results for input(s): "LIPASE", "AMYLASE" in the last 168 hours. No results for input(s): "AMMONIA" in the last 168 hours.  ABG    Component Value Date/Time   PHART 7.446 01/13/2020 1155   PCO2ART 34.0 01/13/2020 1155   PO2ART 136 (H) 01/13/2020 1155   HCO3 23.0 01/13/2020 1155   TCO2 17 (L) 12/25/2019 1726   ACIDBASEDEF 0.2 01/13/2020 1155   O2SAT 98.9 01/13/2020 1155     Coagulation Profile: No results for input(s): "INR", "PROTIME" in the last 168 hours.  Cardiac Enzymes: No results for input(s): "CKTOTAL", "CKMB", "CKMBINDEX", "TROPONINI" in the last 168 hours.  HbA1C: Hgb A1c MFr Bld  Date/Time Value Ref Range Status  08/18/2021 02:21 AM 5.1 4.8 - 5.6 % Final    Comment:    (NOTE) Pre diabetes:          5.7%-6.4%  Diabetes:              >6.4%  Glycemic control for   <7.0% adults with diabetes   03/25/2021 04:45 AM 4.5 (L) 4.8 - 5.6 % Final    Comment:    (NOTE) Pre diabetes:          5.7%-6.4%  Diabetes:              >6.4%  Glycemic control for   <7.0% adults with diabetes     CBG: No results for input(s): "GLUCAP" in the last 168 hours.  Review of Systems:   Unable to obtain since delirious  Past Medical History:  She,  has a past medical history of Alcohol abuse, HTN (hypertension) (10/05/2020), and Necrotizing fasciitis ([place]).   Surgical History:   Past Surgical History:  Procedure Laterality Date   BIOPSY  10/07/2020   Procedure: BIOPSY;  Surgeon: Daneil Dolin, MD;  [facility];  Service: Endoscopy;;   COLONOSCOPY WITH PROPOFOL N/A 10/07/2020   single healing rectal ulcer in distal rectum with surrounding mucosal friability and markedly abnormal proximal colon with ulceration s/p biopsies.   COLONOSCOPY WITH PROPOFOL  02/22/2021   Procedure: COLONOSCOPY WITH PROPOFOL;  Surgeon: [name], DO;  [facility];  Service: Endoscopy;;   COLOSTOMY TAKEDOWN N/A 01/26/2022   Procedure:  LAPAROSCOPIC COLOSTOMY TAKEDOWN;  Surgeon: Leighton Ruff, MD;  [facility];  Service: General;  Laterality: N/A;  laparoscopic assisted colostomy reversal and partial colectomy   ESOPHAGOGASTRODUODENOSCOPY (EGD) WITH PROPOFOL N/A 10/07/2020   non-bleeding gastric ulcer . Pathology with H.pylori negative   ESOPHAGOGASTRODUODENOSCOPY (EGD) WITH PROPOFOL N/A 12/27/2020   Procedure: ESOPHAGOGASTRODUODENOSCOPY (EGD) WITH PROPOFOL;  Surgeon: [name], DO;  [facility];  Service: Endoscopy;  Laterality: N/A;  1:00pm   FLEXIBLE SIGMOIDOSCOPY N/A 01/11/2020   Procedure: FLEXIBLE SIGMOIDOSCOPY;  Surgeon: Carol Ada, MD;  [facility];  Service: Gastroenterology;  Laterality: N/A;   INCISION AND DRAINAGE PERIRECTAL ABSCESS N/A 12/25/2019   Procedure: IRRIGATION AND DEBRIDEMENT BUTTOCKS, LAP LOOP COLOSTOMY;  Surgeon: Greer Pickerel, MD;  [facility];  Service: General;  Laterality: N/A;   IRRIGATION AND  DEBRIDEMENT ABSCESS N/A 12/23/2019   Procedure: EXCISION AND DEBRIDEMENT LEFT BUTTOCK AND PERINEUM;  Surgeon: Clovis Riley, MD;  [facility];  Service: General;  Laterality: N/A;   LAPAROSCOPIC LOOP COLOSTOMY N/A 12/25/2019   Procedure: LAPAROSCOPIC LOOP COLOSTOMY;  Surgeon: Greer Pickerel, MD;  [facility];  Service: General;  Laterality: N/A;   TONSILLECTOMY       Social History:   reports that she has never smoked. She has never used smokeless tobacco. She reports that she does not currently use alcohol. She reports that she does not currently use drugs.   Family History:  Her family history includes Arrhythmia in her mother; Colon polyps in her mother. There is no history of Colon cancer.   Allergies Allergies  Allergen Reactions   Pantoprazole     Stomach pain   Penicillins Hives    Did it involve swelling of the face/tongue/throat, SOB, or low BP? N Did it involve sudden or severe rash/hives, skin peeling, or any reaction on the inside of  your mouth or nose? Y Did you need to seek medical attention at a hospital or doctor's office? N When did it last happen?  Childhood     If all above answers are "NO", may proceed with cephalosporin use.    Oxycodone Hcl Rash     Home Medications  Prior to Admission medications   Medication Sig Start Date End Date Taking? Authorizing Provider  acetaminophen (TYLENOL) 325 MG tablet Take 325 mg by mouth every 6 (six) hours as needed for mild pain.   Yes [provider]  atorvastatin (LIPITOR) 20 MG tablet Take 20 mg by mouth daily. 11/12/21  Yes [provider]  cholecalciferol (VITAMIN D3) 25 MCG (1000 UNIT) tablet Take 1,000 Units by mouth daily.   Yes [provider]  famotidine (PEPCID) 20 MG tablet Take 20 mg by mouth daily. 11/11/21  Yes [provider]  hydrOXYzine (ATARAX) 25 MG tablet Take 1 tablet (25 mg total) by mouth 3 (three) times daily as needed for itching. 12/08/21 03/08/22 Yes Carver, Charles K, DO  ibuprofen (ADVIL) 800 MG tablet Take 800 mg by mouth every 8 (eight) hours as needed for moderate pain.   Yes [provider]  Nutritional Supplements (BOOST PO) Take 1 Bottle by mouth 4 (four) times daily.   Yes [provider]  psyllium (HYDROCIL/METAMUCIL) 95 % PACK Take 1 packet by mouth daily. Patient taking differently: Take 1 packet by mouth daily as needed for mild constipation. 08/28/21  Yes Tat, Shanon Brow, MD  vitamin C (ASCORBIC ACID) 500 MG tablet Take 500 mg by mouth daily.   Yes [provider]  naproxen (NAPROSYN) 500 MG tablet Take 1 tablet (500 mg total) by mouth 2 (two) times daily with a meal. 01/11/22   Noemi Chapel, MD     Critical care time: [place]. [place] [facility] Pager : [phone]  If no response to pager , please call [phone] until 7 pm After 7:00 pm call [place]  [phone]   01/29/2022

## 2022-01-30 DIAGNOSIS — Z933 Colostomy status: Secondary | ICD-10-CM

## 2022-01-30 LAB — BASIC METABOLIC PANEL
Anion gap: 11 (ref 5–15)
BUN: 9 mg/dL (ref 6–20)
CO2: 21 mmol/L — ABNORMAL LOW (ref 22–32)
Calcium: 8.9 mg/dL (ref 8.9–10.3)
Chloride: 105 mmol/L (ref 98–111)
Creatinine, Ser: 0.49 mg/dL (ref 0.44–1.00)
GFR, Estimated: >60 mL/min (ref >60)
Glucose, Bld: 107 mg/dL — ABNORMAL HIGH (ref 70–99)
Potassium: 3.9 mmol/L (ref 3.5–5.1)
Sodium: 137 mmol/L (ref 135–145)

## 2022-01-30 LAB — MAGNESIUM: Magnesium: 1.8 mg/dL (ref 1.7–2.4)

## 2022-01-30 NOTE — Consult Note (Signed)
  Patient is asleep and sedated at this time. Psychiatry will reassess tomorrow. Consult placed for positive CSSRS screening and alcohol withdraw.

## 2022-01-30 NOTE — Progress Notes (Signed)
4 Days Post-Op colostomy reversal  Subjective/Chief Complaint: Having etoh withdrawal.  Transferred to SDU for precedex.  was tolerating a diet and having bowel function.  Sleepy this am   Objective: Vital signs in last 24 hours: Temp:  [97.6 F (36.4 C)-99.4 F (37.4 C)] 98.4 F (36.9 C) (07/17 0805) Pulse Rate:  [94-140] 106 (07/17 0800) Resp:  [17-46] 31 (07/17 0800) BP: (98-161)/(60-112) 154/96 (07/17 0800) SpO2:  [88 %-100 %] 98 % (07/17 0800) Weight:  [82.1 kg] 82.1 kg (07/17 0339) Last BM Date : 01/30/22  Intake/Output from previous day: 07/16 0701 - 07/17 0700 In: 698.1 [P.O.:680; I.V.:18.1] Out: 100 [Urine:100] Intake/Output this shift: Total I/O In: 54.7 [I.V.:54.7] Out: -   Ab soft approp tender wound with some drainage as expected  Lab Results:  Recent Labs    01/28/22 0445 01/29/22 0822  WBC 5.6 5.9  HGB 8.0* 9.1*  HCT 25.3* 28.8*  PLT 138* 169    BMET Recent Labs    01/29/22 0405 01/30/22 0334  NA 135 137  K 3.6 3.9  CL 104 105  CO2 23 21*  GLUCOSE 104* 107*  BUN 10 9  CREATININE 0.56 0.49  CALCIUM 9.0 8.9    PT/INR No results for input(s): "LABPROT", "INR" in the last 72 hours. ABG No results for input(s): "PHART", "HCO3" in the last 72 hours.  Invalid input(s): "PCO2", "PO2"  Studies/Results: No results found.  Anti-infectives: Anti-infectives (From admission, onward)    Start     Dose/Rate Route Frequency Ordered Stop   01/26/22 0600  cefoTEtan (CEFOTAN) 2 g in sodium chloride 0.9 % 100 mL IVPB        2 g 200 mL/hr over 30 Minutes Intravenous On call to O.R. 01/26/22 0532 01/26/22 0736       Assessment/Plan: POD 4 loop colostomy reversal -patient has completed post op care -d/c when stable per critical care team -change outer dressing daily and prn and binder on when oob    Rosario Adie 5/73/2202

## 2022-01-30 NOTE — Progress Notes (Signed)
   NAME:  Alyssa Carey, MRN:  [id number], DOB:  1971-03-23, LOS: 4 ADMISSION DATE:  01/26/2022, CONSULTATION DATE:  01/30/2022  REFERRING MD:  Donne Hazel, CCS, CHIEF COMPLAINT: Delirium  History of Present Illness:  51 year old with a history of alcoholism and colostomy in 2021 underwent elective colostomy takedown on 7/13, uneventful surgery.  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  History is obtained from chart review and speaking to cousin Alyssa Carey at the bedside Small amount of bright red blood PR reported 7/16  in the afternoon 7/16 she became agitated and disoriented , CIWA protocol was started, hospitalist consulted.  She was reassessed around 7 PM, received 8 mg of Ativan per RN, CIWA score was still elevated at 37, tachycardic to 140s, hence PCCM consulted for transfer to ICU  Pertinent  Medical History  EtOH hepatitis, withdrawal Necrotizing fasciitis, gluteal cleft 2021 requiring colostomy  Significant Hospital Events: Including procedures, antibiotic start and stop dates in addition to other pertinent events   7/13 colostomy takedown , partial colectomy  Interim History / Subjective:  No distress.   Objective   Blood pressure 130/83, pulse 98, temperature 99 F (37.2 C), temperature source Oral, resp. rate (Abnormal) 39, height 4' 9"  (1.448 m), weight 82.1 kg, last menstrual period 11/30/2021, SpO2 100 %.        Intake/Output Summary (Last 24 hours) at 01/30/2022 0803 Last data filed at 01/29/2022 2321 Gross per 24 hour  Intake 458.06 ml  Output 100 ml  Net 358.06 ml   Filed Weights   01/26/22 0535 01/29/22 0500 01/30/22 0339  Weight: 76.7 kg 84.6 kg 82.1 kg    Examination:  General 51 year old female currently lying in bed, No distress.  HENT NCAT no JVD MMM Pulm clear dec bases Card rrr Abd soft not tender Neuro awake and alert. Oriented X 3 Ext warm and dry   Resolved Hospital Problem list      Assessment & Plan:  Acute delirium, likely delirium tremens from ETOH w/d rather than postop delirium. She is on Depakote but no prior history of schizophrenia or bipolar Plan Wean precedex Cont taper Librium  Cont thiamine and folate.  Light son during day/off at night Freq re-orientation  Postop care per surgery, no evidence of sepsis  Best Practice (right click and "Reselect all SmartList Selections" daily)   Diet/type: Regular consistency (see orders) DVT prophylaxis: SCD GI prophylaxis: N/A Lines: N/A Foley:  N/A Code Status:  full code Last date of multidisciplinary goals of care discussion [NA]   Critical care time: NA   Erick Colace ACNP-BC [place] Pager # [phone] OR # [phone] if no answer  01/30/2022

## 2022-01-31 DIAGNOSIS — F1010 Alcohol abuse, uncomplicated: Secondary | ICD-10-CM

## 2022-01-31 NOTE — Plan of Care (Signed)
  Problem: Education: Goal: Knowledge of General Education information will improve Description: Including pain rating scale, medication(s)/side effects and non-pharmacologic comfort measures Outcome: Progressing   Problem: Health Behavior/Discharge Planning: Goal: Ability to manage health-related needs will improve Outcome: Progressing   Problem: Clinical Measurements: Goal: Respiratory complications will improve Outcome: Progressing   Problem: Coping: Goal: Level of anxiety will decrease Outcome: Progressing   Problem: Pain Managment: Goal: General experience of comfort will improve Outcome: Progressing   Problem: Education: Goal: Understanding of discharge needs will improve Outcome: Progressing   Problem: Safety: Goal: Ability to remain free from injury will improve Outcome: Progressing

## 2022-01-31 NOTE — Consult Note (Signed)
  51 year old with a history of alcoholism and colostomy in 2021 underwent elective colostomy takedown on 7/13, uneventful surgery.  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  History is obtained from chart review and speaking to cousin Alyssa Carey at the bedside\\.Small amount of bright red blood PR reported 7/16. in the afternoon 7/16 she became agitated and disoriented , CIWA protocol was started, hospitalist consulted.  She was reassessed around 7 PM, received 8 mg of Ativan per RN, CIWA score was still elevated at 37, tachycardic to 140s, hence PCCM consulted for transfer to ICU  Alyssa Carey is a 51 year old female who presented for \colostomy repair. Psych consulted for positive suicide screening, ETOH withdraw.  She denies any acute symptoms of mania at this time to include impulsivity, grandiosity, mood lability, hypersexuality.  She does not present with any of the above symptoms on this evaluation.  She further denies any depressive symptoms to include anhedonia, hopelessness, worthlessness, guilty, suicidal.  She denies any acute psychosis, paranoia.  She does not appear to be displaying any or responding to internal stimuli, external stimuli, or exhibiting delusional thought disorder.  Patient denies any access to weapons, denies any alcohol and or substance abuse.  She reports moderate sleep and fair appetite.  She denies interest or motivation in cessation of alcohol use\, rehabilitation or counseling.  Patient denies any auditory and/or visual hallucinations, does not appear to be responding to internal or external stimuli.  There is no evidence of delusional thought content and patient appears to answer all questions appropriately.  \\CSSRS remains negative at this time, suspect elevated suicide screenings due to alcohol withdraw, anesthesia, and frustration.  She denies any suicide history, suicidal ideation, suicidal attempt. She denies any  history of homicidality, violence, aggression, or agitation.\  At this time patient appears to be psychiatrically stable to discharge home, with support system services in place.  -Rescind IVC.  -D/C suicide sitter -TOC for substance use resources. -Continue CIWA protocol at this time.  -Psych cleared at this time.

## 2022-01-31 NOTE — Progress Notes (Signed)
IVC order expired, confirmed with Rolm Bookbinder, MD. Patient alert and oriented at this time

## 2022-01-31 NOTE — TOC CM/SW Note (Signed)
This RNCM received call from [place] regarding process to rescind IVC. Per IVC document on file, it is 1 day commitment, if correct this patient was IVC'd on 01/29/22 at 3:50pm, after 24 hours, the IVC will expire. RNCM advised any physician can sign the change of commitment form if needed.

## 2022-01-31 NOTE — Progress Notes (Signed)
   NAME:  Alyssa Carey, MRN:  [id number], DOB:  08-09-1970, LOS: 5 ADMISSION DATE:  01/26/2022, CONSULTATION DATE:  01/31/2022  REFERRING MD:  Donne Hazel, CCS, CHIEF COMPLAINT: Delirium  History of Present Illness:  51 year old with a history of alcoholism and colostomy in 2021 underwent elective colostomy takedown on 7/13, uneventful surgery.  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  History is obtained from chart review and speaking to cousin [name] at the bedside Small amount of bright red blood PR reported 7/16  in the afternoon 7/16 she became agitated and disoriented , CIWA protocol was started, hospitalist consulted.  She was reassessed around 7 PM, received 8 mg of Ativan per RN, CIWA score was still elevated at 37, tachycardic to 140s, hence PCCM consulted for transfer to ICU  Pertinent  Medical History  EtOH hepatitis, withdrawal Necrotizing fasciitis, gluteal cleft 2021 requiring colostomy  Significant Hospital Events: Including procedures, antibiotic start and stop dates in addition to other pertinent events   7/13 colostomy takedown , partial colectomy  Interim History / Subjective:  Stayed off precedex since yesterday morning. Resting comfortably  Objective   Blood pressure (!) 137/91, pulse (!) 108, temperature 98.5 F (36.9 C), temperature source Oral, resp. rate (!) 34, height 4' 9"  (1.448 m), weight 82.1 kg, last menstrual period 11/30/2021, SpO2 98 %.        Intake/Output Summary (Last 24 hours) at 01/31/2022 0803 Last data filed at 01/30/2022 0845 Gross per 24 hour  Intake 2.35 ml  Output --  Net 2.35 ml   Filed Weights   01/26/22 0535 01/29/22 0500 01/30/22 0339  Weight: 76.7 kg 84.6 kg 82.1 kg    Examination:  Lethargic, arousable Normal speech, no complaints.  Tachycardic, regular Breathing is nonlabored and lungs are clear No edema  Resolved Hospital Problem list     Assessment & Plan:   Acute delirium, likely delirium tremens from ETOH w/d - improved Stable off precedex. Would continue librium taper. Minimal prn ativan use.  Ok to transition out of the ICU from a CC standpoint.   PCCM will see as needed, please call with questions.  Lenice Llamas, MD Pulmonary and [place] 01/31/2022 8:20 AM Pager: see AMION  If no response to pager, please call critical care on call (see AMION) until 7pm After 7:00 pm call El[place]

## 2022-02-01 LAB — BASIC METABOLIC PANEL
Anion gap: 8 (ref 5–15)
BUN: 6 mg/dL (ref 6–20)
CO2: 25 mmol/L (ref 22–32)
Calcium: 8.9 mg/dL (ref 8.9–10.3)
Chloride: 106 mmol/L (ref 98–111)
Creatinine, Ser: 0.52 mg/dL (ref 0.44–1.00)
GFR, Estimated: >60 mL/min (ref >60)
Glucose, Bld: 117 mg/dL — ABNORMAL HIGH (ref 70–99)
Potassium: 3.2 mmol/L — ABNORMAL LOW (ref 3.5–5.1)
Sodium: 139 mmol/L (ref 135–145)

## 2022-02-01 LAB — CBC
HCT: 28.9 % — ABNORMAL LOW (ref 36.0–46.0)
Hemoglobin: 8.8 g/dL — ABNORMAL LOW (ref 12.0–15.0)
MCH: 31.8 pg (ref 26.0–34.0)
MCHC: 30.4 g/dL (ref 30.0–36.0)
MCV: 104.3 fL — ABNORMAL HIGH (ref 80.0–100.0)
Platelets: 252 10*3/uL (ref 150–400)
RBC: 2.77 MIL/uL — ABNORMAL LOW (ref 3.87–5.11)
RDW: 13.2 % (ref 11.5–15.5)
WBC: 3.8 10*3/uL — ABNORMAL LOW (ref 4.0–10.5)
nRBC: 0 % (ref 0.0–0.2)

## 2022-02-01 LAB — MAGNESIUM: Magnesium: 1.7 mg/dL (ref 1.7–2.4)

## 2022-02-01 MED ORDER — POTASSIUM CHLORIDE CRYS ER 20 MEQ PO TBCR
40.0000 meq | EXTENDED_RELEASE_TABLET | Freq: Two times a day (BID) | ORAL | Status: AC
  Administered 2022-02-01 – 2022-02-02 (×3): 40 meq via ORAL
  Filled 2022-02-01 (×3): qty 2

## 2022-02-01 NOTE — Progress Notes (Signed)
6 Days Post-Op colostomy reversal  Subjective/Chief Complaint: Awake and alert. tolerating a diet and having bowel function.  Complains of some pain around incision and nausea   Objective: Vital signs in last 24 hours: Temp:  [98 F (36.7 C)-98.6 F (37 C)] 98.2 F (36.8 C) (07/19 0730) Pulse Rate:  [86-109] 93 (07/19 0934) Resp:  [17-37] 28 (07/19 0934) BP: (117-140)/(68-110) 128/74 (07/19 0934) SpO2:  [95 %-100 %] 96 % (07/19 0934) Last BM Date : 01/30/22  Intake/Output from previous day: No intake/output data recorded. Intake/Output this shift: No intake/output data recorded.  Ab soft approp tender wound with some drainage as expected  Lab Results:  No results for input(s): "WBC", "HGB", "HCT", "PLT" in the last 72 hours.  BMET Recent Labs    01/30/22 0334  NA 137  K 3.9  CL 105  CO2 21*  GLUCOSE 107*  BUN 9  CREATININE 0.49  CALCIUM 8.9    PT/INR No results for input(s): "LABPROT", "INR" in the last 72 hours. ABG No results for input(s): "PHART", "HCO3" in the last 72 hours.  Invalid input(s): "PCO2", "PO2"  Studies/Results: No results found.  Anti-infectives: Anti-infectives (From admission, onward)    Start     Dose/Rate Route Frequency Ordered Stop   01/26/22 0600  cefoTEtan (CEFOTAN) 2 g in sodium chloride 0.9 % 100 mL IVPB        2 g 200 mL/hr over 30 Minutes Intravenous On call to O.R. 01/26/22 0532 01/26/22 0736       Assessment/Plan: POD 6 loop colostomy reversal -will recheck some labs today, but she appears to be healing well -should be ok to d/c when stable per medical team -change outer dressing daily and prn and binder on when oob - transfer to floor   Rosario Adie 2/80/0349

## 2022-02-01 NOTE — Progress Notes (Addendum)
5 Days Post-Op colostomy reversal  Subjective/Chief Complaint: Awake and alert. tolerating a diet and having bowel function.  Complains of some pain around incision   Objective: Vital signs in last 24 hours: Temp:  [98 F (36.7 C)-98.6 F (37 C)] 98.6 F (37 C) (07/19 0404) Pulse Rate:  [91-113] 96 (07/18 1900) Resp:  [17-47] 26 (07/18 1900) BP: (116-140)/(68-110) 124/86 (07/18 1900) SpO2:  [92 %-100 %] 100 % (07/18 1900) Last BM Date : 01/30/22  Intake/Output from previous day: No intake/output data recorded. Intake/Output this shift: No intake/output data recorded.  Ab soft approp tender wound with some drainage as expected  Lab Results:  No results for input(s): "WBC", "HGB", "HCT", "PLT" in the last 72 hours.  BMET Recent Labs    01/30/22 0334  NA 137  K 3.9  CL 105  CO2 21*  GLUCOSE 107*  BUN 9  CREATININE 0.49  CALCIUM 8.9   PT/INR No results for input(s): "LABPROT", "INR" in the last 72 hours. ABG No results for input(s): "PHART", "HCO3" in the last 72 hours.  Invalid input(s): "PCO2", "PO2"  Studies/Results: No results found.  Anti-infectives: Anti-infectives (From admission, onward)    Start     Dose/Rate Route Frequency Ordered Stop   01/26/22 0600  cefoTEtan (CEFOTAN) 2 g in sodium chloride 0.9 % 100 mL IVPB        2 g 200 mL/hr over 30 Minutes Intravenous On call to O.R. 01/26/22 0532 01/26/22 0736       Assessment/Plan: POD 5 loop colostomy reversal -patient has completed post op care -d/c when stable per critical care team -change outer dressing daily and prn and binder on when oob    Rosario Adie 1/38/8719

## 2022-02-01 NOTE — TOC Initial Note (Signed)
Transition of Care [facility]) - Initial/Assessment Note    Patient Details  Name: Alyssa Carey MRN: [id number] Date of Birth: 07-May-1971  [facility] CM/SW Contact:    Lennart Pall, LCSW Phone Number: 02/01/2022, 2:30 PM  Clinical Narrative:                 Met with pt to introduce CSW role and discuss SA resources.  Pt very calm and engaged and reports she is feeling much better today.  She talks openly about her recent loss of her mother in December and notes that she began drinking again and "partying just to deal". She is adamant that she does not intend to continue ETOH at discharge and declines any resource information from CSW.  She confirms that she has a Licensed conveyancer at [facility] in [facility] and feels very comfortable connecting with her if she is concerned about relapse.  I have encouraged her to reach out to [place] if she feels she might benefit from grief counseling and placed contact info on AVS.  At this time, not further TOC needs and will sign off.  Please reorder as needed.  Expected Discharge Plan: Home/Self Care Barriers to Discharge: Continued Medical Work up   Patient Goals and CMS Choice Patient states their goals for this hospitalization and ongoing recovery are:: return home      Expected Discharge Plan and Services Expected Discharge Plan: Home/Self Care In-house Referral: Clinical Social Work     Living arrangements for the past 2 months: Single Family Home                                      Prior Living Arrangements/Services Living arrangements for the past 2 months: Single Family Home Lives with:: Self Patient language and need for interpreter reviewed:: Yes Do you feel safe going back to the place where you live?: Yes      Need for Family Participation in Patient Care: No (Comment) Care giver support system in place?: No (comment)   Criminal Activity/Legal Involvement Pertinent to Current  Situation/Hospitalization: No - Comment as needed  Activities of Daily Living Home Assistive Devices/Equipment: None ADL Screening (condition at time of admission) Patient's cognitive ability adequate to safely complete daily activities?: Yes Is the patient deaf or have difficulty hearing?: No Does the patient have difficulty seeing, even when wearing glasses/contacts?: No Does the patient have difficulty concentrating, remembering, or making decisions?: No Patient able to express need for assistance with ADLs?: Yes Does the patient have difficulty dressing or bathing?: No Independently performs ADLs?: Yes (appropriate for developmental age) Does the patient have difficulty walking or climbing stairs?: No Weakness of Legs: None Weakness of Arms/Hands: None  Permission Sought/Granted                  Emotional Assessment Appearance:: Appears stated age Attitude/Demeanor/Rapport: Engaged, Gracious Affect (typically observed): Accepting Orientation: : Oriented to Self, Oriented to Place, Oriented to  Time, Oriented to Situation Alcohol / Substance Use: Alcohol Use Psych Involvement: Yes (comment)  Admission diagnosis:  Colostomy status ([place]) [Z93.3] Patient Active Problem List   Diagnosis Date Noted   Obesity, Class III, BMI 40-49.9 (morbid obesity) ([place]) 01/29/2022   Alcohol withdrawal ([place]) 01/29/2022   Colostomy status ([place]) 01/26/2022   Diarrhea with dehydration 09/03/2021   Hypomagnesemia 09/02/2021   Paroxysmal SVT (supraventricular tachycardia) ([place]) 08/24/2021  Atrial flutter (HCC)    SBO (small bowel obstruction) ([place])    Malnutrition of moderate degree 08/17/2021   Dehydration 08/10/2021   Swelling of lower extremity 04/21/2021   Symptomatic anemia due to Gi bleed 04/09/2021   Hypokalemia due to excessive gastrointestinal loss of potassium 03/24/2021   Hypoalbuminemia due to protein-calorie malnutrition (HCC) 03/24/2021   Hypoglycemia 03/24/2021   Obesity  (BMI 30.0-34.9) 03/24/2021   Serum total bilirubin elevated 03/24/2021   Anemia 03/24/2021   Prolonged QT interval 03/24/2021   Acute hepatitis    Alcohol use    Abnormal liver enzymes 03/23/2021   PUD (peptic ulcer disease) 12/02/2020   Acute blood loss anemia 10/06/2020   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)    Elevated alkaline phosphatase level    Abdominal pain    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    Acute GI bleeding 10/05/2020   Transaminitis 10/05/2020   HTN (hypertension) 10/05/2020   Mild protein malnutrition ([place]) 10/05/2020   Stercoral ulcer of rectum 01/12/2020   Aphonia 01/12/2020   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   Vocal fold paresis, 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   Hypernatremia 01/12/2020   Anasarca 01/12/2020   Necrotizing fasciitis of pelvic region and thigh ([place]) 12/28/2019   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   Acute respiratory failure with hypoxemia (HCC)    Alcoholic hepatitis without ascites    Pressure injury of skin 12/22/2019   Fall at home, initial encounter 12/21/2019   Weakness 12/21/2019   Alcoholism ([place]) 12/21/2019   Thrombocytopenia ([place]) 12/21/2019   ARF (acute renal failure) ([place]) 12/21/2019   Hepatic encephalopathy 05/13/2535   Alcoholic liver disease ([place]) 12/21/2019   PCP:  Caryl Bis, MD Pharmacy:   [facility] [address], [address] [place] [address] Phone: [phone] Fax: [phone]  [place] [address] Phone: [phone] Fax: [phone]     Social Determinants of Health (SDOH) Interventions    Readmission Risk Interventions    02/01/2022    2:28 PM 03/24/2021   12:50 PM  Readmission Risk Prevention Plan  Transportation Screening Complete Complete  PCP or Specialist Appt within 5-7 Days Complete   Home Care Screening Complete   Medication Review (RN CM) Complete   HRI or Home Care Consult  Complete  Social Work  Consult for [place] Planning/Counseling  Complete  Palliative Care Screening  Not Applicable  Medication Review Press photographer)  Complete

## 2022-02-01 NOTE — Consult Note (Signed)
Patient: Alyssa Carey DOB: [birth year] PCP: Caryl Bis, MD DOA: 01/26/2022 DOS: the patient was seen and examined on 02/01/2022 Primary service: Leighton Ruff, MD  Referring physician: Dr. Marcello Moores Reason for consult: Medical Management  Assessment and Plan: Colostomy reversal     - per primary team; including pain control, diet advancement  EtOH withdrawal EtOH abuse     - transferred to ICU for precedex; now weaned off; CCM has signed off     - continue librium taper, thiamine folate, MVI     - counsel against further use  Morbid obesity     - counsel on diet/lifestyle changes     - follow up outpt  HTN     - normotensive, not on meds, follow  Hypokalemia     - replace K+, check Mg2+  Normocytic anemia     - stable over last several days, follow  TRH will continue to follow the patient.  HPI: Alyssa Carey is a 51 y.o. female with past medical history of EtOH abuse. Presenting to surgical service of colostomy takedown. Successfully completed procedure 7/13. TRH consulted on 7/16 for agitation. She was in EtOH withdrawal. She was initially started on CIWA; however, she required transfer to the ICU for precedex gtt. She has now been weaned off precedex. CCM has signed off. TRH was reconsulted for medical management.   Review of Systems: As mentioned in the history of present illness. All other systems reviewed and are negative. Past Medical History:  Diagnosis Date   Alcohol abuse    HTN (hypertension) 10/05/2020   Necrotizing fasciitis [facility])    Past Surgical History:  Procedure Laterality Date   BIOPSY  10/07/2020   Procedure: BIOPSY;  Surgeon: Daneil Dolin, MD;  [facility];  Service: Endoscopy;;   COLONOSCOPY WITH PROPOFOL N/A 10/07/2020   single healing rectal ulcer in distal rectum with surrounding mucosal friability and markedly abnormal proximal colon with ulceration s/p biopsies.   COLONOSCOPY WITH PROPOFOL  02/22/2021    Procedure: COLONOSCOPY WITH PROPOFOL;  Surgeon: [name], DO;  [facility];  Service: Endoscopy;;   COLOSTOMY TAKEDOWN N/A 01/26/2022   Procedure: LAPAROSCOPIC COLOSTOMY TAKEDOWN;  Surgeon: Leighton Ruff, MD;  Location: [facility];  Service: General;  Laterality: N/A;  laparoscopic assisted colostomy reversal and partial colectomy   ESOPHAGOGASTRODUODENOSCOPY (EGD) WITH PROPOFOL N/A 10/07/2020   non-bleeding gastric ulcer . Pathology with H.pylori negative   ESOPHAGOGASTRODUODENOSCOPY (EGD) WITH PROPOFOL N/A 12/27/2020   Procedure: ESOPHAGOGASTRODUODENOSCOPY (EGD) WITH PROPOFOL;  Surgeon: [name], DO;  [facility];  Service: Endoscopy;  Laterality: N/A;  1:00pm   FLEXIBLE SIGMOIDOSCOPY N/A 01/11/2020   Procedure: FLEXIBLE SIGMOIDOSCOPY;  Surgeon: Carol Ada, MD;  [facility];  Service: Gastroenterology;  Laterality: N/A;   INCISION AND DRAINAGE PERIRECTAL ABSCESS N/A 12/25/2019   Procedure: IRRIGATION AND DEBRIDEMENT BUTTOCKS, LAP LOOP COLOSTOMY;  Surgeon: Greer Pickerel, MD;  Location[facility];  Service: General;  Laterality: N/A;   IRRIGATION AND DEBRIDEMENT ABSCESS N/A 12/23/2019   Procedure: EXCISION AND DEBRIDEMENT LEFT BUTTOCK AND PERINEUM;  Surgeon: Clovis Riley, MD;  Location: [facility];  Service: General;  Laterality: N/A;   LAPAROSCOPIC LOOP COLOSTOMY N/A 12/25/2019   Procedure: LAPAROSCOPIC LOOP COLOSTOMY;  Surgeon: Greer Pickerel, MD;  Location: [facility];  Service: General;  Laterality: N/A;   TONSILLECTOMY     Social History:  reports that she has never smoked. She has never used smokeless tobacco.  She reports that she does not currently use alcohol. She reports that she does not currently use drugs.  Allergies  Allergen Reactions   Pantoprazole     Stomach pain   Penicillins Hives    Did it involve swelling of the face/tongue/throat, SOB, or low BP? N Did it involve sudden or severe rash/hives, skin peeling, or any reaction on the  inside of your mouth or nose? Y Did you need to seek medical attention at a hospital or doctor's office? N When did it last happen?  Childhood     If all above answers are "NO", may proceed with cephalosporin use.    Oxycodone Hcl Rash    Family History  Problem Relation Age of Onset   Colon polyps Mother        does not believe adenomas   Arrhythmia Mother    Colon cancer Neg Hx     Prior to Admission medications   Medication Sig Start Date End Date Taking? Authorizing Provider  acetaminophen (TYLENOL) 325 MG tablet Take 325 mg by mouth every 6 (six) hours as needed for mild pain.   Yes [provider]  atorvastatin (LIPITOR) 20 MG tablet Take 20 mg by mouth daily. 11/12/21  Yes [provider]  cholecalciferol (VITAMIN D3) 25 MCG (1000 UNIT) tablet Take 1,000 Units by mouth daily.   Yes [provider]  famotidine (PEPCID) 20 MG tablet Take 20 mg by mouth daily. 11/11/21  Yes [provider]  hydrOXYzine (ATARAX) 25 MG tablet Take 1 tablet (25 mg total) by mouth 3 (three) times daily as needed for itching. 12/08/21 03/08/22 Yes Carver, Charles K, DO  ibuprofen (ADVIL) 800 MG tablet Take 800 mg by mouth every 8 (eight) hours as needed for moderate pain.   Yes [provider]  Nutritional Supplements (BOOST PO) Take 1 Bottle by mouth 4 (four) times daily.   Yes [provider]  psyllium (HYDROCIL/METAMUCIL) 95 % PACK Take 1 packet by mouth daily. Patient taking differently: Take 1 packet by mouth daily as needed for mild constipation. 08/28/21  Yes Tat, Shanon Brow, MD  vitamin C (ASCORBIC ACID) 500 MG tablet Take 500 mg by mouth daily.   Yes [provider]  naproxen (NAPROSYN) 500 MG tablet Take 1 tablet (500 mg total) by mouth 2 (two) times daily with a meal. 01/11/22   Noemi Chapel, MD    Physical Exam: Vitals:   02/01/22 1500 02/01/22 1540 02/01/22 1600 02/01/22 1700  BP:      Pulse: 90  92 90  Resp: (!) 36  (!) 30 (!) 28   Temp:  98.1 F (36.7 C)    TempSrc:  Oral    SpO2: 99%  100% 99%  Weight:      Height:       General: 51 y.o. female resting in chair in NAD Eyes: PERRL, normal sclera ENMT: Nares patent w/o discharge, orophaynx clear, dentition normal, ears w/o discharge/lesions/ulcers Neck: Supple, trachea midline Cardiovascular: RRR, +S1, S2, no m/g/r, equal pulses throughout Respiratory: CTABL, no w/r/r, normal WOB GI: BS+, ND, appropriate TTP at surgical site, no masses noted, no organomegaly noted MSK: No e/c/c Neuro: A&O x 3, no focal deficits Psyc: Appropriate interaction and affect, calm/cooperative  Data Reviewed:   Lab Results  Component Value Date   NA 139 02/01/2022   K 3.2 (L) 02/01/2022   CO2 25 02/01/2022   GLUCOSE 117 (H) 02/01/2022   BUN 6 02/01/2022   CREATININE 0.52 02/01/2022  CALCIUM 8.9 02/01/2022   GFRNONAA >60 02/01/2022      Family Communication: None at bedside Thank you very much for involving [place] in the care of your patient.  Author: Jonnie Finner, DO 02/01/2022 6:26 PM  For on call review [email or web address].

## 2022-02-02 LAB — RENAL FUNCTION PANEL
Albumin: 2.6 g/dL — ABNORMAL LOW (ref 3.5–5.0)
Anion gap: 7 (ref 5–15)
BUN: 8 mg/dL (ref 6–20)
CO2: 24 mmol/L (ref 22–32)
Calcium: 8.5 mg/dL — ABNORMAL LOW (ref 8.9–10.3)
Chloride: 107 mmol/L (ref 98–111)
Creatinine, Ser: 0.36 mg/dL — ABNORMAL LOW (ref 0.44–1.00)
GFR, Estimated: >60 mL/min (ref >60)
Glucose, Bld: 93 mg/dL (ref 70–99)
Phosphorus: 3.4 mg/dL (ref 2.5–4.6)
Potassium: 3.7 mmol/L (ref 3.5–5.1)
Sodium: 138 mmol/L (ref 135–145)

## 2022-02-02 NOTE — Progress Notes (Signed)
Post-Op colostomy reversal  Subjective/Chief Complaint: Awake and alert. tolerating a diet and having bowel function.  Complains of some pain around incision, controlled with PO meds   Objective: Vital signs in last 24 hours: Temp:  [97.5 F (36.4 C)-98.4 F (36.9 C)] 98.4 F (36.9 C) (07/20 0700) Pulse Rate:  [90-104] 98 (07/20 0700) Resp:  [20-37] 20 (07/20 0700) BP: (116-128)/(74-84) 118/84 (07/20 0700) SpO2:  [93 %-100 %] 99 % (07/20 0700) Weight:  [81.1 kg-85.6 kg] 81.1 kg (07/20 0700) Last BM Date : 01/30/22  Intake/Output from previous day: No intake/output data recorded. Intake/Output this shift: No intake/output data recorded.  Ab soft approp tender wound with some drainage as expected  Lab Results:  Recent Labs    02/01/22 1155  WBC 3.8*  HGB 8.8*  HCT 28.9*  PLT 252    BMET Recent Labs    02/01/22 1155 02/02/22 0319  NA 139 138  K 3.2* 3.7  CL 106 107  CO2 25 24  GLUCOSE 117* 93  BUN 6 8  CREATININE 0.52 0.36*  CALCIUM 8.9 8.5*    PT/INR No results for input(s): "LABPROT", "INR" in the last 72 hours. ABG No results for input(s): "PHART", "HCO3" in the last 72 hours.  Invalid input(s): "PCO2", "PO2"  Studies/Results: No results found.  Anti-infectives: Anti-infectives (From admission, onward)    Start     Dose/Rate Route Frequency Ordered Stop   01/26/22 0600  cefoTEtan (CEFOTAN) 2 g in sodium chloride 0.9 % 100 mL IVPB        2 g 200 mL/hr over 30 Minutes Intravenous On call to O.R. 01/26/22 0532 01/26/22 0736       Assessment/Plan: POD 7 loop colostomy reversal -she appears to be healing well -ok to d/c when stable per medical team -change outer dressing daily and prn and binder on when oob -ambulate in hall TID   Rosario Adie 9/39/0300

## 2022-02-02 NOTE — Progress Notes (Signed)
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[facility]) Active Problems:   Normocytic anemia   Alcohol withdrawal ([place])   HTN (hypertension)   Hypokalemia   Alcohol use   Obesity, Class III, BMI 40-49.9 (morbid obesity) ([place])   Colostomy reversal -per primary team; including pain control, diet advancement -she has not had a BM in 2 days, would defer need for laxatives to surgery   EtOH withdrawal EtOH abuse - Initially required transfer to ICU for precedex; now weaned off; CCM has signed off - She has completed librium taper - currently, she appears to be calm and appropriate, no signs of ongoing withdrawal -she is motivated to abstain from further alcohol use -seen by [facility] for SA resources -seen by psychiatry for positive suicide screening and ETOH withdrawal.  -cleared by psych for discharge   Obesity, class II -BMI 38.6 - counsel on diet/lifestyle changes - follow up outpt   HTN - normotensive, not on meds, follow   Hypokalemia -replaced, now in normal range -Mg 1.7  Hyperlipidemia -resume statin on discharge   Normocytic anemia - stable over last several days, follow   Patient is medically stable for discharge when cleared by primary team. Her medical issues appear to be stable. We will follow peripherally at this point. She should follow up with her primary care doctor for chronic disease management.    Subjective: She is sitting up in chair. Describes some discomfort in her abdomen. She is concerned that she has not had a significant bowel movement in 2 days  Objective: Vitals:   02/02/22 0500 02/02/22 0700 02/02/22 1201 02/02/22 1436  BP:  118/84 96/63 99/75   Pulse:  98 98 97  Resp:  20 16 16   Temp:  98.4 F (36.9 C) 98 F (36.7 C) 98.5 F (36.9 C)  TempSrc:  Oral Oral Oral  SpO2:  99% 97% 100%  Weight: 85.6 kg 81.1 kg     Height:       No intake or output data in the 24 hours ending 02/02/22 1806 Filed Weights   01/30/22 0339 02/02/22 0500 02/02/22 0700  Weight: 82.1 kg 85.6 kg 81.1 kg    Examination:  General exam: Appears calm and comfortable  Respiratory system: Clear to auscultation. Respiratory effort normal. Cardiovascular system: S1 & S2 heard, RRR. No JVD, murmurs, rubs, gallops or clicks. No pedal edema. Gastrointestinal system: Abdomen is soft and diffusely tender. No organomegaly or masses felt. Normal bowel sounds heard. Central nervous system: Alert and oriented. No focal neurological deficits. Extremities: Symmetric 5 x 5 power. Skin: No rashes, lesions or ulcers Psychiatry: Judgement and insight appear normal. Mood & affect appropriate.     Data Reviewed: I have personally reviewed following labs and imaging studies  CBC: Recent Labs  Lab 01/27/22 0505 01/28/22 0445 01/29/22 0822 02/01/22 1155  WBC 9.1 5.6 5.9 3.8*  HGB 8.9* 8.0* 9.1* 8.8*  HCT 27.9* 25.3* 28.8* 28.9*  MCV 102.6* 103.7* 102.9* 104.3*  PLT 141* 138* 169 326   Basic Metabolic Panel: Recent Labs  Lab 01/28/22 0445 01/29/22 0405 01/30/22 0334 02/01/22 1155 02/02/22 0319  NA 137 135 137 139 138  K 4.1 3.6 3.9 3.2* 3.7  CL 107 104 105 106 107  CO2 24 23 21* 25 24  GLUCOSE 106* 104* 107* 117* 93  BUN 10 10 9 6  8  CREATININE 0.50 0.56 0.49 0.52 0.36*  CALCIUM 8.7* 9.0 8.9 8.9 8.5*  MG  --   --  1.8 1.7  --   PHOS  --   --   --   --  3.4   GFR: Estimated Creatinine Clearance: 75.5 mL/min (A) (by C-G formula based on SCr of 0.36 mg/dL (L)). Liver Function Tests: Recent Labs  Lab 02/02/22 0319  ALBUMIN 2.6*   No results for input(s): "LIPASE", "AMYLASE" in the last 168 hours. No results for input(s): "AMMONIA" in the last 168 hours. Coagulation Profile: No results for input(s): "INR", "PROTIME" in the last 168 hours. Cardiac Enzymes: No results for input(s): "CKTOTAL", "CKMB", "CKMBINDEX",  "TROPONINI" in the last 168 hours. BNP (last 3 results) No results for input(s): "PROBNP" in the last 8760 hours. HbA1C: No results for input(s): "HGBA1C" in the last 72 hours. CBG: No results for input(s): "GLUCAP" in the last 168 hours. Lipid Profile: No results for input(s): "CHOL", "HDL", "LDLCALC", "TRIG", "CHOLHDL", "LDLDIRECT" in the last 72 hours. Thyroid Function Tests: No results for input(s): "TSH", "T4TOTAL", "FREET4", "T3FREE", "THYROIDAB" in the last 72 hours. Anemia Panel: No results for input(s): "VITAMINB12", "FOLATE", "FERRITIN", "TIBC", "IRON", "RETICCTPCT" in the last 72 hours. Sepsis Labs: No results for input(s): "PROCALCITON", "LATICACIDVEN" in the last 168 hours.  Recent Results (from the past 240 hour(s))  MRSA Next Gen by PCR, Nasal     Status: None   Collection Time: 01/29/22  9:02 PM   Specimen: Nasal Mucosa; Nasal Swab  Result Value Ref Range Status   MRSA by PCR Next Gen NOT DETECTED NOT DETECTED Final    Comment: (NOTE) The GeneXpert MRSA Assay (FDA approved for NASAL specimens only), is one component of a comprehensive MRSA colonization surveillance program. It is not intended to diagnose MRSA infection nor to guide or monitor treatment for MRSA infections. Test performance is not FDA approved in patients less than 75 years old. Performed at [facility], [place] [address], [place], [place] [address]          Radiology Studies: No results found.      Scheduled Meds:  Chlorhexidine Gluconate Cloth  6 each Topical Daily   famotidine  20 mg Oral Daily   feeding supplement  237 mL Oral BID BM   folic acid  1 mg Oral Daily   multivitamin with minerals  1 tablet Oral Daily   potassium chloride  40 mEq Oral BID   saccharomyces boulardii  250 mg Oral BID   thiamine  100 mg Oral Daily   Or   thiamine  100 mg Intravenous Daily   Continuous Infusions:   LOS: 7 days    Time spent: 62mns    JKathie Dike MD [facility]   If 7PM-7AM, please contact night-coverage [email or web address]  02/02/2022, 6:06 PM

## 2022-02-03 ENCOUNTER — Other Ambulatory Visit (HOSPITAL_COMMUNITY): Payer: Self-pay

## 2022-02-03 MED ORDER — HYDROCODONE-ACETAMINOPHEN 5-325 MG PO TABS
1.0000 | ORAL_TABLET | Freq: Four times a day (QID) | ORAL | 0 refills | Status: DC | PRN
  Filled 2022-02-03: qty 15, 2d supply, fill #0

## 2022-02-03 NOTE — TOC Transition Note (Signed)
Transition of Care [facility]) - CM/SW Discharge Note   Patient Details  Name: Alyssa Carey MRN: [id number] Date of Birth: Aug 27, 1970  [facility]) CM/SW Contact:  Dessa Phi, RN Phone Number: 02/03/2022, 10:49 AM   Clinical Narrative: d/c home no CM needs or orders.        Barriers to Discharge: No Barriers Identified   Patient Goals and CMS Choice Patient states their goals for this hospitalization and ongoing recovery are:: return home      Discharge Placement                       Discharge Plan and Services In-house Referral: Clinical Social Work                                   Social Determinants of Health (SDOH) Interventions     Readmission Risk Interventions    02/01/2022    2:28 PM 03/24/2021   12:50 PM  Readmission Risk Prevention Plan  Transportation Screening Complete Complete  PCP or Specialist Appt within 5-7 Days Complete   Home Care Screening Complete   Medication Review (RN CM) Complete   HRI or Home Care Consult  Complete  Social Work Consult for [place] Planning/Counseling  Complete  Palliative Care Screening  Not Applicable  Medication Review [employer])  Complete

## 2022-02-03 NOTE — Discharge Summary (Signed)
Physician Discharge Summary  Patient ID: Alyssa Carey MRN: [id number] DOB/AGE: 12-17-1970 51 y.o.  Admit date: 01/26/2022 Discharge date: 02/03/2022  Admission Diagnoses: Colostomy in place Discharge Diagnoses:  Principal Problem:   Colostomy status ([place]) Active Problems:   HTN (hypertension)   Hypokalemia   Alcohol use   Normocytic anemia   Obesity, Class III, BMI 40-49.9 (morbid obesity) ([place])   Alcohol withdrawal ([place])   Discharged Condition: good  Hospital Course: Patient was admitted to the med surg floor after surgery.  Diet was advanced as tolerated.  Patient began to have bowel function on postop day 3.  On POD 3, she developed signs of alcohol withdrawal and ultimately was sent to the ICU for precedex.  This was weaned and switched to librium.  The patient stabilized and was transferred to the floor.  By postop day 8, she was tolerating a solid diet and pain was controlled with oral medications.  She was urinating without difficulty and ambulating without assistance.  Patient was medically cleared and felt to be in stable condition for discharge to home.   Consults: CCM, hospitalist  Significant Diagnostic Studies: labs: cbc, bmet  Treatments: IV hydration and surgery: lap assisted colostomy closure  Discharge Exam: Blood pressure 122/88, pulse (!) 102, temperature 98.6 F (37 C), temperature source Oral, resp. rate 18, height 4' 9"  (1.448 m), weight 81.1 kg, last menstrual period 11/30/2021, SpO2 93 %. General appearance: alert and cooperative GI: normal findings: soft, non-distended Skin: Skin color, texture, turgor normal. Incision draining appropriately.  No signs of infection   Disposition: Discharge disposition: 01-Home or Self Care        Allergies as of 02/03/2022       Reactions   Pantoprazole    Stomach pain   Penicillins Hives   Did it involve swelling of the face/tongue/throat, SOB, or low BP? N Did it involve sudden or severe rash/hives, skin  peeling, or any reaction on the inside of your mouth or nose? Y Did you need to seek medical attention at a hospital or doctor's office? N When did it last happen?  Childhood     If all above answers are "NO", may proceed with cephalosporin use.   Oxycodone Hcl Rash        Medication List     STOP taking these medications    acetaminophen 325 MG tablet Commonly known as: TYLENOL   ibuprofen 800 MG tablet Commonly known as: ADVIL       TAKE these medications    atorvastatin 20 MG tablet Commonly known as: LIPITOR Take 20 mg by mouth daily.   BOOST PO Take 1 Bottle by mouth 4 (four) times daily.   cholecalciferol 25 MCG (1000 UNIT) tablet Commonly known as: VITAMIN D3 Take 1,000 Units by mouth daily.   famotidine 20 MG tablet Commonly known as: PEPCID Take 20 mg by mouth daily.   HYDROcodone-acetaminophen 5-325 MG tablet Commonly known as: NORCO/VICODIN Take 1-2 tablets by mouth every 6 (six) hours as needed for moderate pain or severe pain.   hydrOXYzine 25 MG tablet Commonly known as: ATARAX Take 1 tablet (25 mg total) by mouth 3 (three) times daily as needed for itching.   naproxen 500 MG tablet Commonly known as: Naprosyn Take 1 tablet (500 mg total) by mouth 2 (two) times daily with a meal.   psyllium 95 % Pack Commonly known as: HYDROCIL/METAMUCIL Take 1 packet by mouth daily. What changed:  when to take this reasons to take this  vitamin C 500 MG tablet Commonly known as: ASCORBIC ACID Take 500 mg by mouth daily.        Follow-up Information     Leighton Ruff, MD. Schedule an appointment as soon as possible for a visit in 2 week(s).   Specialties: General Surgery, Colon and Rectal Surgery Contact information: [address] [place] [address] [place] Follow up.   Why: Please reach out to agency for grief counseling support as needed Contact information: [address]  [place] [address] [phone]                 Signed: Rosario Adie 07/18/4950, 10:45 AM

## 2022-02-07 ENCOUNTER — Emergency Department (HOSPITAL_COMMUNITY): Payer: Self-pay

## 2022-02-07 ENCOUNTER — Other Ambulatory Visit: Payer: Self-pay

## 2022-02-07 ENCOUNTER — Encounter (HOSPITAL_COMMUNITY): Payer: Self-pay

## 2022-02-07 ENCOUNTER — Emergency Department (HOSPITAL_COMMUNITY)
Admission: EM | Admit: 2022-02-07 | Discharge: 2022-02-07 | Disposition: A | Discharge status: Home / Self Care | Payer: Self-pay | Attending: Emergency Medicine | Admitting: Emergency Medicine

## 2022-02-07 DIAGNOSIS — R609 Edema, unspecified: Secondary | ICD-10-CM

## 2022-02-07 DIAGNOSIS — R600 Localized edema: Secondary | ICD-10-CM | POA: Insufficient documentation

## 2022-02-07 DIAGNOSIS — T8131XA Disruption of external operation (surgical) wound, not elsewhere classified, initial encounter: Secondary | ICD-10-CM | POA: Insufficient documentation

## 2022-02-07 DIAGNOSIS — I10 Essential (primary) hypertension: Secondary | ICD-10-CM | POA: Insufficient documentation

## 2022-02-07 DIAGNOSIS — R0602 Shortness of breath: Secondary | ICD-10-CM | POA: Insufficient documentation

## 2022-02-07 DIAGNOSIS — Z931 Gastrostomy status: Secondary | ICD-10-CM | POA: Insufficient documentation

## 2022-02-07 DIAGNOSIS — R059 Cough, unspecified: Secondary | ICD-10-CM | POA: Insufficient documentation

## 2022-02-07 LAB — CBC WITH DIFFERENTIAL/PLATELET
Abs Immature Granulocytes: 0.01 10*3/uL (ref 0.00–0.07)
Basophils Absolute: 0 10*3/uL (ref 0.0–0.1)
Basophils Relative: 1 %
Eosinophils Absolute: 0.1 10*3/uL (ref 0.0–0.5)
Eosinophils Relative: 2 %
HCT: 28.3 % — ABNORMAL LOW (ref 36.0–46.0)
Hemoglobin: 8.5 g/dL — ABNORMAL LOW (ref 12.0–15.0)
Immature Granulocytes: 0 %
Lymphocytes Relative: 29 %
Lymphs Abs: 1.3 10*3/uL (ref 0.7–4.0)
MCH: 31.3 pg (ref 26.0–34.0)
MCHC: 30 g/dL (ref 30.0–36.0)
MCV: 104 fL — ABNORMAL HIGH (ref 80.0–100.0)
Monocytes Absolute: 0.4 10*3/uL (ref 0.1–1.0)
Monocytes Relative: 9 %
Neutro Abs: 2.7 10*3/uL (ref 1.7–7.7)
Neutrophils Relative %: 59 %
Platelets: 427 10*3/uL — ABNORMAL HIGH (ref 150–400)
RBC: 2.72 MIL/uL — ABNORMAL LOW (ref 3.87–5.11)
RDW: 13.9 % (ref 11.5–15.5)
WBC: 4.6 10*3/uL (ref 4.0–10.5)
nRBC: 0 % (ref 0.0–0.2)

## 2022-02-07 LAB — BASIC METABOLIC PANEL
Anion gap: 10 (ref 5–15)
BUN: 8 mg/dL (ref 6–20)
CO2: 19 mmol/L — ABNORMAL LOW (ref 22–32)
Calcium: 8.5 mg/dL — ABNORMAL LOW (ref 8.9–10.3)
Chloride: 109 mmol/L (ref 98–111)
Creatinine, Ser: 0.52 mg/dL (ref 0.44–1.00)
GFR, Estimated: >60 mL/min (ref >60)
Glucose, Bld: 79 mg/dL (ref 70–99)
Potassium: 3.7 mmol/L (ref 3.5–5.1)
Sodium: 138 mmol/L (ref 135–145)

## 2022-02-07 LAB — HEPATIC FUNCTION PANEL
ALT: 15 U/L (ref 0–44)
AST: 38 U/L (ref 15–41)
Albumin: 3 g/dL — ABNORMAL LOW (ref 3.5–5.0)
Alkaline Phosphatase: 162 U/L — ABNORMAL HIGH (ref 38–126)
Bilirubin, Direct: 0.1 mg/dL (ref 0.0–0.2)
Indirect Bilirubin: 0.1 mg/dL — ABNORMAL LOW (ref 0.3–0.9)
Total Bilirubin: 0.2 mg/dL — ABNORMAL LOW (ref 0.3–1.2)
Total Protein: 7.1 g/dL (ref 6.5–8.1)

## 2022-02-07 LAB — POC URINE PREG, ED: Preg Test, Ur: NEGATIVE

## 2022-02-07 LAB — BRAIN NATRIURETIC PEPTIDE: B Natriuretic Peptide: 150 pg/mL — ABNORMAL HIGH (ref 0.0–100.0)

## 2022-02-07 MED ORDER — FUROSEMIDE 40 MG PO TABS
20.0000 mg | ORAL_TABLET | Freq: Once | ORAL | Status: AC
  Administered 2022-02-07: 20 mg via ORAL
  Filled 2022-02-07: qty 1

## 2022-02-07 MED ORDER — HYDROCODONE-ACETAMINOPHEN 5-325 MG PO TABS
1.0000 | ORAL_TABLET | Freq: Once | ORAL | Status: AC
  Administered 2022-02-07: 1 via ORAL
  Filled 2022-02-07: qty 1

## 2022-02-07 NOTE — Discharge Instructions (Signed)
You are being prescribed a low-dose Lasix to help you with your peripheral edema.  Your lab tests tonight are reassuring, Lasix will help reduce the swelling in your legs, be advised that this medicine will increase your need to urinate.  Plan to follow-up with Dr. Marcello Moores or one of her partners tomorrow for recheck of your abdominal surgical site.  Keep the site covered, changing the dressing if it saturates through.

## 2022-02-07 NOTE — ED Provider Notes (Signed)
[facility] EMERGENCY DEPARTMENT Provider Note   CSN: [id number] Arrival date & time: 02/07/22  [place]     History  Chief Complaint  Patient presents with   Leg Swelling    Alyssa Carey is a 51 y.o. female with a history significant for hypertension, alcohol abuse, distant history of necrotizing fasciitis of her rectum with need of subsequent ostomy which has been present for the past 2 years, was discharged from [facility] where she underwent an ostomy takedown and repair of a large hernia on July 13.  She reports having significant bilateral lower extremity edema which started 3 days ago.  She denies prior similar symptoms, also denies shortness of breath, chest pain, orthopnea, also no fevers or chills.  She denies pain in her legs but states they feel heavy and tight.  She also notes she has been coughing since her discharge from the hospital 4 days ago, and states she had 1 episode of significant coughing where she felt a pulling sensation at her surgery site after which her bandages continue to become soaked in a pale pink fluid.  She denies significant pain at the site.   Of note,  review of chart indicates pt had echocardiogram 1/23 revealing low normal left sided function EF 50-55%, right side normal.   The history is provided by the patient.       Home Medications Prior to Admission medications   Medication Sig Start Date End Date Taking? Authorizing Provider  promethazine (PHENERGAN) 25 MG tablet 1 tablet as needed for nausea or abdominal discomfort 01/12/22  Yes [provider]  atorvastatin (LIPITOR) 20 MG tablet Take 20 mg by mouth daily. 11/12/21   [provider]  cholecalciferol (VITAMIN D3) 25 MCG (1000 UNIT) tablet Take 1,000 Units by mouth daily.    [provider]  famotidine (PEPCID) 20 MG tablet Take 20 mg by mouth daily. 11/11/21   [provider]  HYDROcodone-acetaminophen (NORCO/VICODIN) 5-325 MG tablet Take 1-2  tablets by mouth every 6 (six) hours as needed for moderate pain or severe pain. 0/92/33   Leighton Ruff, MD  hydrOXYzine (ATARAX) 25 MG tablet Take 1 tablet (25 mg total) by mouth 3 (three) times daily as needed for itching. 12/08/21 03/08/22  [name], DO  naproxen (NAPROSYN) 500 MG tablet Take 1 tablet (500 mg total) by mouth 2 (two) times daily with a meal. 01/11/22   Noemi Chapel, MD  Nutritional Supplements (BOOST PO) Take 1 Bottle by mouth 4 (four) times daily.    [provider]  psyllium (HYDROCIL/METAMUCIL) 95 % PACK Take 1 packet by mouth daily. Patient taking differently: Take 1 packet by mouth daily as needed for mild constipation. 08/28/21   Orson Eva, MD  vitamin C (ASCORBIC ACID) 500 MG tablet Take 500 mg by mouth daily.    [provider]      Allergies    Pantoprazole, Penicillins, and Oxycodone hcl    Review of Systems   Review of Systems  Constitutional:  Negative for chills and fever.  HENT:  Negative for congestion and sore throat.   Eyes: Negative.   Respiratory:  Negative for chest tightness and shortness of breath.   Cardiovascular:  Positive for leg swelling. Negative for chest pain.  Gastrointestinal:  Negative for abdominal pain, nausea and vomiting.  Genitourinary: Negative.   Musculoskeletal:  Negative for arthralgias, joint swelling and neck pain.  Skin:  Positive for wound. Negative for rash.  Neurological:  Negative for  dizziness, weakness, light-headedness, numbness and headaches.  Psychiatric/Behavioral: Negative.    All other systems reviewed and are negative.   Physical Exam Updated Vital Signs BP (!) 154/103   Pulse 93   Temp 98.3 F (36.8 C) (Oral)   Resp (!) 26   Ht 4' 9"  (1.448 m)   Wt 80 kg   LMP 11/30/2021   SpO2 99%   BMI 38.17 kg/m  Physical Exam Vitals and nursing note reviewed.  Constitutional:      Appearance: She is well-developed.  HENT:     Head: Normocephalic and atraumatic.  Eyes:      Conjunctiva/sclera: Conjunctivae normal.  Cardiovascular:     Rate and Rhythm: Normal rate and regular rhythm.     Pulses:          Dorsalis pedis pulses are 2+ on the right side and 2+ on the left side.     Heart sounds: Normal heart sounds.  Pulmonary:     Effort: Pulmonary effort is normal.     Breath sounds: Normal breath sounds. No wheezing.  Abdominal:     General: Bowel sounds are normal.     Palpations: Abdomen is soft.     Tenderness: There is no abdominal tenderness.  Musculoskeletal:        General: Normal range of motion.     Cervical back: Normal range of motion.     Right lower leg: Edema present.     Left lower leg: Edema present.     Comments: 2+ edema bilateral lower extremities to upper tibia.   Skin:    General: Skin is warm and dry.  Neurological:     Mental Status: She is alert.      ED Results / Procedures / Treatments   Labs (all labs ordered are listed, but only abnormal results are displayed) Labs Reviewed  CBC WITH DIFFERENTIAL/PLATELET - Abnormal; Notable for the following components:      Result Value   RBC 2.72 (*)    Hemoglobin 8.5 (*)    HCT 28.3 (*)    MCV 104.0 (*)    Platelets 427 (*)    All other components within normal limits  BASIC METABOLIC PANEL - Abnormal; Notable for the following components:   CO2 19 (*)    Calcium 8.5 (*)    All other components within normal limits  BRAIN NATRIURETIC PEPTIDE - Abnormal; Notable for the following components:   B Natriuretic Peptide 150.0 (*)    All other components within normal limits  HEPATIC FUNCTION PANEL - Abnormal; Notable for the following components:   Albumin 3.0 (*)    Alkaline Phosphatase 162 (*)    Total Bilirubin 0.2 (*)    Indirect Bilirubin 0.1 (*)    All other components within normal limits  POC URINE PREG, ED    EKG EKG Interpretation  Date/Time:  Tuesday February 07 2022 20:56:02 EDT Ventricular Rate:  86 PR Interval:  132 QRS Duration: 96 QT Interval:  394 QTC  Calculation: 472 R Axis:   -9 Text Interpretation: Sinus rhythm Nonspecific T abnormalities, anterior leads Confirmed by Fredia Sorrow [phone]) on 02/07/2022 9:11:44 PM  Radiology DG ABD ACUTE 2+V W 1V CHEST  Result Date: 02/07/2022 CLINICAL DATA:  Shortness of breath, postop EXAM: DG ABDOMEN ACUTE WITH 1 VIEW CHEST COMPARISON:  CT abdomen/pelvis dated 01/03/2022. Chest radiograph dated 08/23/2021. FINDINGS: Low lung volumes. Mild lingular atelectasis. No pleural effusion or pneumothorax. The heart is normal in size. Nonobstructive bowel gas pattern.  No evidence of free air under the diaphragm on the upright view. Visualized osseous structures are within normal limits. IMPRESSION: Negative abdominal radiographs.  No acute cardiopulmonary disease. Electronically Signed   By: Julian Hy M.D.   On: 02/07/2022 21:45    Procedures Procedures    Medications Ordered in ED Medications  HYDROcodone-acetaminophen (NORCO/VICODIN) 5-325 MG per tablet 1 tablet (has no administration in time range)  furosemide (LASIX) tablet 20 mg (has no administration in time range)    ED Course/ Medical Decision Making/ A&P                           Medical Decision Making Patient with 2 complaints, primary being lower extremity edema, she denies shortness of breath, orthopnea.  She has been coughing, however has also recently undergone significant surgery with an ostomy takedown on July 13, chest x-ray is clear today with no effusion or pneumonia.  Also has negative abdominal radiographs, no free air.  She does have a mild elevation in her BNP of 150, however this has been significantly more elevated in the past.  Her oxygen saturation has been normal here.  No significant coughing, no rales on exam.  She was placed on low-dose Lasix.  Also discussed ostomy site and need for urgent surgical reevaluation.  Per below, discussed with Dr. Brantley Stage who recommended she be seen in their office tomorrow for recheck of  this site.  This was discussed with patient and she was agreeable.  She will call in the morning for an appointment time.  There is no sign of infection at this surgical site.  Amount and/or Complexity of Data Reviewed Labs: ordered.    Details: Hemoglobin is low at 8.5, this is a macrocytic anemia and a chronic finding.  She has a BNP of 150, minimally elevated, which is actually improved in comparison to a prior BNP of 387 9 months ago.  She denies shortness of breath.  She does have a low albumin at 3.0, which is stable in comparison in the 2.6 to 3.0 range. Radiology: ordered.    Details: acute abd series negative for acute cardiopulmonary disease or abnormal bowel gas pattern, no free air. Discussion of management or test interpretation with external provider(s): Spoke with Dr. Brantley Stage of general surgery regarding patient's surgical site which appears to have been disrupted, probably due to excessive coughing.  He stated that if no bowel is visualized, patient can follow-up in their office tomorrow for recheck of the site.  Risk Prescription drug management.           Final Clinical Impression(s) / ED Diagnoses Final diagnoses:  Peripheral edema  Surgical wound dehiscence, initial encounter    Rx / DC Orders ED Discharge Orders     None         Landis Martins 02/07/22 2325    Fredia Sorrow, MD 02/14/22 1630

## 2022-02-07 NOTE — ED Notes (Signed)
ED Provider at bedside. 

## 2022-02-07 NOTE — ED Notes (Signed)
Abd pad and gauze applied to incision.

## 2022-02-07 NOTE — ED Triage Notes (Signed)
Pt c/o bilateral leg swelling that started approx. 3 days ago and has gotten worse since.   Pt states she had an ostomy reversal on the 13th of this month.

## 2022-02-09 ENCOUNTER — Encounter (HOSPITAL_COMMUNITY): Payer: Self-pay

## 2022-02-09 ENCOUNTER — Other Ambulatory Visit: Payer: Self-pay

## 2022-02-09 ENCOUNTER — Emergency Department (HOSPITAL_COMMUNITY)
Admission: EM | Admit: 2022-02-09 | Discharge: 2022-02-09 | Disposition: A | Discharge status: Home / Self Care | Payer: Self-pay | Attending: Emergency Medicine | Admitting: Emergency Medicine

## 2022-02-09 DIAGNOSIS — I10 Essential (primary) hypertension: Secondary | ICD-10-CM | POA: Insufficient documentation

## 2022-02-09 DIAGNOSIS — R600 Localized edema: Secondary | ICD-10-CM | POA: Insufficient documentation

## 2022-02-09 DIAGNOSIS — R609 Edema, unspecified: Secondary | ICD-10-CM

## 2022-02-09 DIAGNOSIS — T8189XD Other complications of procedures, not elsewhere classified, subsequent encounter: Secondary | ICD-10-CM | POA: Insufficient documentation

## 2022-02-09 MED ORDER — FUROSEMIDE 40 MG PO TABS
20.0000 mg | ORAL_TABLET | Freq: Once | ORAL | Status: AC
  Administered 2022-02-09: 20 mg via ORAL
  Filled 2022-02-09: qty 1

## 2022-02-09 MED ORDER — HYDROCODONE-ACETAMINOPHEN 5-325 MG PO TABS
1.0000 | ORAL_TABLET | Freq: Once | ORAL | Status: AC
  Administered 2022-02-09: 1 via ORAL
  Filled 2022-02-09: qty 1

## 2022-02-09 NOTE — ED Triage Notes (Addendum)
Pt c/o bilateral foot swelling that started approx. 7 days ago. Pt had an ostomy reversal on the 13th on this month.

## 2022-02-09 NOTE — Discharge Instructions (Addendum)
Increase your Lasix to 20 mg twice a day for the next 4 days.  Elevation of your legs rather than having them dependent on the floor can also help eliminate edema.  I have spoken with Dr. Marcello Moores who suggests to just use dry packing rather than wet-to-dry packing which may start slowing up the drainage.

## 2022-02-09 NOTE — ED Provider Notes (Incomplete)
[facility] EMERGENCY DEPARTMENT Provider Note   CSN: [id number] Arrival date & time: 02/09/22  2014     History {Add pertinent medical, surgical, social history, OB history to HPI:1} Chief Complaint  Patient presents with  . Foot Swelling    Alyssa Carey is a 51 y.o. female with a history significant for hypertension, alcohol abuse, distant history of necrotizing fasciitis of her rectum with need of subsequent ostomy which has been present for the past 2 years, was discharged from [facility] hospital where she underwent an ostomy takedown and repair of a large hernia on July 13.  She was seen here by me 2 days ago with complaints of nonhealing of the surgical site and there was concern of the surgery site tearing after a cough episode.  She was seen by her surgeon yesterday at which time they advised her to start applying wet-to-dry packing, she was told she should have to do this about once a day, but since she started packing the wound it has continued to drain, clear yellow/pink-tinged fluid, stating she is having to change the dressing 4-5 times per day.  She denies fevers or chills or any increased pain at the site.   Additionally she had complaints of bilateral lower extremity edema, her labs completed 2 days ago were reassuring, and she was placed on Lasix 20 mg daily, she states she has not had increased urination nor has she felt her swelling has improved.   The history is provided by the patient.       Home Medications Prior to Admission medications   Medication Sig Start Date End Date Taking? Authorizing Provider  atorvastatin (LIPITOR) 20 MG tablet Take 20 mg by mouth daily. 11/12/21   [provider]  cholecalciferol (VITAMIN D3) 25 MCG (1000 UNIT) tablet Take 1,000 Units by mouth daily.    [provider]  famotidine (PEPCID) 20 MG tablet Take 20 mg by mouth daily. 11/11/21   [provider]  HYDROcodone-acetaminophen (NORCO/VICODIN) 5-325  MG tablet Take 1-2 tablets by mouth every 6 (six) hours as needed for moderate pain or severe pain. 3/97/67   Leighton Ruff, MD  hydrOXYzine (ATARAX) 25 MG tablet Take 1 tablet (25 mg total) by mouth 3 (three) times daily as needed for itching. 12/08/21 03/08/22  [name], DO  naproxen (NAPROSYN) 500 MG tablet Take 1 tablet (500 mg total) by mouth 2 (two) times daily with a meal. 01/11/22   Noemi Chapel, MD  Nutritional Supplements (BOOST PO) Take 1 Bottle by mouth 4 (four) times daily.    [provider]  promethazine (PHENERGAN) 25 MG tablet 1 tablet as needed for nausea or abdominal discomfort 01/12/22   [provider]  psyllium (HYDROCIL/METAMUCIL) 95 % PACK Take 1 packet by mouth daily. Patient taking differently: Take 1 packet by mouth daily as needed for mild constipation. 08/28/21   Orson Eva, MD  vitamin C (ASCORBIC ACID) 500 MG tablet Take 500 mg by mouth daily.    [provider]      Allergies    Pantoprazole, Penicillins, and Oxycodone hcl    Review of Systems   Review of Systems  Constitutional:  Negative for chills and fever.  HENT:  Negative for congestion and sore throat.   Eyes: Negative.   Respiratory:  Negative for chest tightness and shortness of breath.   Cardiovascular:  Positive for leg swelling. Negative for chest pain.  Gastrointestinal:  Negative for abdominal pain, nausea and vomiting.  Genitourinary: Negative.   Musculoskeletal:  Negative for arthralgias, joint swelling and neck pain.  Skin:  Negative for rash.  Neurological:  Negative for dizziness, weakness, light-headedness, numbness and headaches.  Psychiatric/Behavioral: Negative.      Physical Exam Updated Vital Signs BP 126/73   Pulse 99   Temp 98.7 F (37.1 C) (Oral)   Resp 17   Ht 4' 9"  (1.448 m)   Wt 75.8 kg   LMP 11/30/2021   SpO2 94%   BMI 36.14 kg/m  Physical Exam Vitals and nursing note reviewed.  Constitutional:      Appearance: She is  well-developed.  HENT:     Head: Normocephalic and atraumatic.  Eyes:     Conjunctiva/sclera: Conjunctivae normal.  Cardiovascular:     Rate and Rhythm: Normal rate and regular rhythm.     Heart sounds: Normal heart sounds.  Pulmonary:     Effort: Pulmonary effort is normal.     Breath sounds: Normal breath sounds. No wheezing.  Abdominal:     General: Bowel sounds are normal.     Palpations: Abdomen is soft.     Tenderness: There is no abdominal tenderness. There is no guarding.     Comments: At terminal ostomy site unchanged from presentation from 2 days ago, refer to that note and photo.  No sign of infection.  Musculoskeletal:        General: Normal range of motion.     Cervical back: Normal range of motion.     Right lower leg: Edema present.     Left lower leg: Edema present.     Comments: 1+ edema to lower tibia bilaterally.  Dorsalis pedal pulses are intact.  Skin:    General: Skin is warm and dry.  Neurological:     Mental Status: She is alert.     ED Results / Procedures / Treatments   Labs (all labs ordered are listed, but only abnormal results are displayed) Labs Reviewed - No data to display  EKG None  Radiology No results found.  Procedures Procedures  {Document cardiac monitor, telemetry assessment procedure when appropriate:1}  Medications Ordered in ED Medications  furosemide (LASIX) tablet 20 mg (20 mg Oral Given 02/09/22 2303)    ED Course/ Medical Decision Making/ A&P                           Medical Decision Making Patient with continued lower extremity edema although on my exam it is actually better than my exam from 2 days ago.  I do believe the Lasix is improving this symptom, however given her continued discomfort I have asked her to increase her Lasix to 20 mg twice a day for the next 3 days and then plan close follow-up with her primary provider next week.  Her lab work from 2 days ago was stable, there is no indication for repeating  labs at this time.  Amount and/or Complexity of Data Reviewed Discussion of management or test interpretation with external provider(s): I was able to talk to Dr. Marcello Moores who saw the patient in her office yesterday and we discussed the increased drainage from the ostomy site.  She suggested the patient do dry packing rather than wet-to-dry packing which may help with drainage a little bit.  Additionally she suggested that the patient may need to concentrate on better nutrition and stop smoking as the ostomy site has really not healed since the initial surgery and she feels  that these 2 habits may be the source of slow healing.  Risk Prescription drug management.     {Document critical care time when appropriate:1} {Document review of labs and clinical decision tools ie heart score, Chads2Vasc2 etc:1}  {Document your independent review of radiology images, and any outside records:1} {Document your discussion with family members, caretakers, and with consultants:1} {Document social determinants of health affecting pt's care:1} {Document your decision making why or why not admission, treatments were needed:1} Final Clinical Impression(s) / ED Diagnoses Final diagnoses:  None    Rx / DC Orders ED Discharge Orders     None

## 2022-02-09 NOTE — ED Provider Notes (Signed)
[facility] EMERGENCY DEPARTMENT Provider Note   CSN: [id number] Arrival date & time: 02/09/22  2014     History {Add pertinent medical, surgical, social history, OB history to HPI:1} Chief Complaint  Patient presents with   Foot Swelling    Alyssa Carey is a 51 y.o. female with a history significant for hypertension, alcohol abuse, distant history of necrotizing fasciitis of her rectum with need of subsequent ostomy which has been present for the past 2 years, was discharged from [facility] hospital where she underwent an ostomy takedown and repair of a large hernia on July 13.  She was seen here by me 2 days ago with complaints of nonhealing of the surgical site and there was concern of the surgery site tearing after a cough episode.  She was seen by her surgeon yesterday at which time they advised her to start applying wet-to-dry packing, she was told she should have to do this about once a day, but since she started packing the wound it has continued to drain, clear yellow/pink-tinged fluid, stating she is having to change the dressing 4-5 times per day.  She denies fevers or chills or any increased pain at the site.   Additionally she had complaints of bilateral lower extremity edema, her labs completed 2 days ago were reassuring, and she was placed on Lasix 20 mg daily, she states she has not had increased urination nor has she felt her swelling has improved.   The history is provided by the patient.       Home Medications Prior to Admission medications   Medication Sig Start Date End Date Taking? Authorizing Provider  atorvastatin (LIPITOR) 20 MG tablet Take 20 mg by mouth daily. 11/12/21   [provider]  cholecalciferol (VITAMIN D3) 25 MCG (1000 UNIT) tablet Take 1,000 Units by mouth daily.    [provider]  famotidine (PEPCID) 20 MG tablet Take 20 mg by mouth daily. 11/11/21   [provider]  HYDROcodone-acetaminophen (NORCO/VICODIN) 5-325 MG  tablet Take 1-2 tablets by mouth every 6 (six) hours as needed for moderate pain or severe pain. 0/09/38   Leighton Ruff, MD  hydrOXYzine (ATARAX) 25 MG tablet Take 1 tablet (25 mg total) by mouth 3 (three) times daily as needed for itching. 12/08/21 03/08/22  [name], DO  naproxen (NAPROSYN) 500 MG tablet Take 1 tablet (500 mg total) by mouth 2 (two) times daily with a meal. 01/11/22   Noemi Chapel, MD  Nutritional Supplements (BOOST PO) Take 1 Bottle by mouth 4 (four) times daily.    [provider]  promethazine (PHENERGAN) 25 MG tablet 1 tablet as needed for nausea or abdominal discomfort 01/12/22   [provider]  psyllium (HYDROCIL/METAMUCIL) 95 % PACK Take 1 packet by mouth daily. Patient taking differently: Take 1 packet by mouth daily as needed for mild constipation. 08/28/21   Orson Eva, MD  vitamin C (ASCORBIC ACID) 500 MG tablet Take 500 mg by mouth daily.    [provider]      Allergies    Pantoprazole, Penicillins, and Oxycodone hcl    Review of Systems   Review of Systems  Constitutional:  Negative for chills and fever.  HENT:  Negative for congestion and sore throat.   Eyes: Negative.   Respiratory:  Negative for chest tightness and shortness of breath.   Cardiovascular:  Positive for leg swelling. Negative for chest pain.  Gastrointestinal:  Negative for abdominal pain, nausea and vomiting.  Genitourinary: Negative.   Musculoskeletal:  Negative for arthralgias, joint swelling and neck pain.  Skin:  Negative for rash.  Neurological:  Negative for dizziness, weakness, light-headedness, numbness and headaches.  Psychiatric/Behavioral: Negative.      Physical Exam Updated Vital Signs BP 126/73   Pulse 99   Temp 98.7 F (37.1 C) (Oral)   Resp 17   Ht 4' 9"  (1.448 m)   Wt 75.8 kg   LMP 11/30/2021   SpO2 94%   BMI 36.14 kg/m  Physical Exam Vitals and nursing note reviewed.  Constitutional:      Appearance: She is  well-developed.  HENT:     Head: Normocephalic and atraumatic.  Eyes:     Conjunctiva/sclera: Conjunctivae normal.  Cardiovascular:     Rate and Rhythm: Normal rate and regular rhythm.     Heart sounds: Normal heart sounds.  Pulmonary:     Effort: Pulmonary effort is normal.     Breath sounds: Normal breath sounds. No wheezing.  Abdominal:     General: Bowel sounds are normal.     Palpations: Abdomen is soft.     Tenderness: There is no abdominal tenderness. There is no guarding.     Comments: At terminal ostomy site unchanged from presentation from 2 days ago, refer to that note and photo.  No sign of infection.  Musculoskeletal:        General: Normal range of motion.     Cervical back: Normal range of motion.     Right lower leg: Edema present.     Left lower leg: Edema present.     Comments: 1+ edema to lower tibia bilaterally.  Dorsalis pedal pulses are intact.  Skin:    General: Skin is warm and dry.  Neurological:     Mental Status: She is alert.     ED Results / Procedures / Treatments   Labs (all labs ordered are listed, but only abnormal results are displayed) Labs Reviewed - No data to display  EKG None  Radiology No results found.  Procedures Procedures  {Document cardiac monitor, telemetry assessment procedure when appropriate:1}  Medications Ordered in ED Medications  furosemide (LASIX) tablet 20 mg (20 mg Oral Given 02/09/22 2303)    ED Course/ Medical Decision Making/ A&P                           Medical Decision Making Patient with continued lower extremity edema although on my exam it is actually better than my exam from 2 days ago.  I do believe the Lasix is improving this symptom, however given her continued discomfort I have asked her to increase her Lasix to 20 mg twice a day for the next 3 days and then plan close follow-up with her primary provider next week.  Her lab work from 2 days ago was stable, there is no indication for repeating  labs at this time.  Amount and/or Complexity of Data Reviewed Discussion of management or test interpretation with external provider(s): I was able to talk to Dr. Marcello Moores who saw the patient in her office yesterday and we discussed the increased drainage from the ostomy site.  She suggested the patient do dry packing rather than wet-to-dry packing which may help with drainage a little bit.  Additionally she suggested that the patient may need to concentrate on better nutrition and stop smoking as the ostomy site has really not healed since the initial surgery and she feels  that these 2 habits may be the source of slow healing.  Risk Prescription drug management.     {Document critical care time when appropriate:1} {Document review of labs and clinical decision tools ie heart score, Chads2Vasc2 etc:1}  {Document your independent review of radiology images, and any outside records:1} {Document your discussion with family members, caretakers, and with consultants:1} {Document social determinants of health affecting pt's care:1} {Document your decision making why or why not admission, treatments were needed:1} Final Clinical Impression(s) / ED Diagnoses Final diagnoses:  None    Rx / DC Orders ED Discharge Orders     None

## 2022-06-13 ENCOUNTER — Ambulatory Visit: Payer: Medicaid Other | Admitting: Gastroenterology

## 2022-11-10 ENCOUNTER — Observation Stay (HOSPITAL_COMMUNITY): Payer: Commercial Managed Care - HMO

## 2022-11-10 ENCOUNTER — Encounter (HOSPITAL_COMMUNITY): Payer: Self-pay

## 2022-11-10 ENCOUNTER — Observation Stay (HOSPITAL_COMMUNITY)
Admission: EM | Admit: 2022-11-10 | Discharge: 2022-11-13 | DRG: 641 | Disposition: A | Discharge status: Home / Self Care | Payer: Commercial Managed Care - HMO | Attending: Internal Medicine | Admitting: Internal Medicine

## 2022-11-10 ENCOUNTER — Emergency Department (HOSPITAL_COMMUNITY): Payer: Commercial Managed Care - HMO

## 2022-11-10 ENCOUNTER — Other Ambulatory Visit: Payer: Self-pay

## 2022-11-10 DIAGNOSIS — R0602 Shortness of breath: Secondary | ICD-10-CM | POA: Diagnosis present

## 2022-11-10 DIAGNOSIS — J122 Parainfluenza virus pneumonia: Secondary | ICD-10-CM | POA: Diagnosis not present

## 2022-11-10 DIAGNOSIS — I10 Essential (primary) hypertension: Secondary | ICD-10-CM | POA: Diagnosis not present

## 2022-11-10 DIAGNOSIS — J219 Acute bronchiolitis, unspecified: Secondary | ICD-10-CM | POA: Diagnosis not present

## 2022-11-10 DIAGNOSIS — F1010 Alcohol abuse, uncomplicated: Secondary | ICD-10-CM | POA: Diagnosis not present

## 2022-11-10 DIAGNOSIS — E878 Other disorders of electrolyte and fluid balance, not elsewhere classified: Secondary | ICD-10-CM | POA: Diagnosis present

## 2022-11-10 DIAGNOSIS — Z8739 Personal history of other diseases of the musculoskeletal system and connective tissue: Secondary | ICD-10-CM

## 2022-11-10 DIAGNOSIS — R197 Diarrhea, unspecified: Secondary | ICD-10-CM | POA: Insufficient documentation

## 2022-11-10 DIAGNOSIS — Z6838 Body mass index (BMI) 38.0-38.9, adult: Secondary | ICD-10-CM

## 2022-11-10 DIAGNOSIS — I872 Venous insufficiency (chronic) (peripheral): Secondary | ICD-10-CM | POA: Diagnosis present

## 2022-11-10 DIAGNOSIS — E860 Dehydration: Secondary | ICD-10-CM | POA: Insufficient documentation

## 2022-11-10 DIAGNOSIS — Z1152 Encounter for screening for COVID-19: Secondary | ICD-10-CM | POA: Insufficient documentation

## 2022-11-10 DIAGNOSIS — Z79899 Other long term (current) drug therapy: Secondary | ICD-10-CM | POA: Diagnosis not present

## 2022-11-10 DIAGNOSIS — Z8711 Personal history of peptic ulcer disease: Secondary | ICD-10-CM

## 2022-11-10 DIAGNOSIS — E871 Hypo-osmolality and hyponatremia: Secondary | ICD-10-CM | POA: Diagnosis not present

## 2022-11-10 DIAGNOSIS — B9789 Other viral agents as the cause of diseases classified elsewhere: Secondary | ICD-10-CM | POA: Diagnosis present

## 2022-11-10 DIAGNOSIS — B348 Other viral infections of unspecified site: Secondary | ICD-10-CM

## 2022-11-10 DIAGNOSIS — R748 Abnormal levels of other serum enzymes: Secondary | ICD-10-CM | POA: Diagnosis not present

## 2022-11-10 DIAGNOSIS — J069 Acute upper respiratory infection, unspecified: Secondary | ICD-10-CM | POA: Diagnosis not present

## 2022-11-10 DIAGNOSIS — Z885 Allergy status to narcotic agent status: Secondary | ICD-10-CM

## 2022-11-10 DIAGNOSIS — E785 Hyperlipidemia, unspecified: Secondary | ICD-10-CM | POA: Diagnosis present

## 2022-11-10 DIAGNOSIS — R945 Abnormal results of liver function studies: Secondary | ICD-10-CM | POA: Diagnosis not present

## 2022-11-10 DIAGNOSIS — R7401 Elevation of levels of liver transaminase levels: Secondary | ICD-10-CM | POA: Diagnosis present

## 2022-11-10 DIAGNOSIS — Z888 Allergy status to other drugs, medicaments and biological substances status: Secondary | ICD-10-CM

## 2022-11-10 DIAGNOSIS — J45909 Unspecified asthma, uncomplicated: Secondary | ICD-10-CM

## 2022-11-10 DIAGNOSIS — Z83719 Family history of colon polyps, unspecified: Secondary | ICD-10-CM

## 2022-11-10 DIAGNOSIS — Z880 Allergy status to penicillin: Secondary | ICD-10-CM

## 2022-11-10 DIAGNOSIS — E669 Obesity, unspecified: Secondary | ICD-10-CM | POA: Diagnosis present

## 2022-11-10 LAB — SODIUM, URINE, RANDOM: Sodium, Ur: <10 mmol/L

## 2022-11-10 LAB — COMPREHENSIVE METABOLIC PANEL
ALT: 84 U/L — ABNORMAL HIGH (ref 0–44)
AST: 286 U/L — ABNORMAL HIGH (ref 15–41)
Albumin: 4 g/dL (ref 3.5–5.0)
Alkaline Phosphatase: 165 U/L — ABNORMAL HIGH (ref 38–126)
Anion gap: 13 (ref 5–15)
BUN: 9 mg/dL (ref 6–20)
CO2: 21 mmol/L — ABNORMAL LOW (ref 22–32)
Calcium: 8.6 mg/dL — ABNORMAL LOW (ref 8.9–10.3)
Chloride: 90 mmol/L — ABNORMAL LOW (ref 98–111)
Creatinine, Ser: 0.64 mg/dL (ref 0.44–1.00)
GFR, Estimated: >60 mL/min (ref >60)
Glucose, Bld: 95 mg/dL (ref 70–99)
Potassium: 3.9 mmol/L (ref 3.5–5.1)
Sodium: 124 mmol/L — ABNORMAL LOW (ref 135–145)
Total Bilirubin: 0.8 mg/dL (ref 0.3–1.2)
Total Protein: 7.7 g/dL (ref 6.5–8.1)

## 2022-11-10 LAB — CBC WITH DIFFERENTIAL/PLATELET
Abs Immature Granulocytes: 0.02 10*3/uL (ref 0.00–0.07)
Basophils Absolute: 0 10*3/uL (ref 0.0–0.1)
Basophils Relative: 1 %
Eosinophils Absolute: 0.1 10*3/uL (ref 0.0–0.5)
Eosinophils Relative: 4 %
HCT: 36.1 % (ref 36.0–46.0)
Hemoglobin: 11.3 g/dL — ABNORMAL LOW (ref 12.0–15.0)
Immature Granulocytes: 1 %
Lymphocytes Relative: 38 %
Lymphs Abs: 1.1 10*3/uL (ref 0.7–4.0)
MCH: 27.6 pg (ref 26.0–34.0)
MCHC: 31.3 g/dL (ref 30.0–36.0)
MCV: 88.3 fL (ref 80.0–100.0)
Monocytes Absolute: 0.4 10*3/uL (ref 0.1–1.0)
Monocytes Relative: 13 %
Neutro Abs: 1.3 10*3/uL — ABNORMAL LOW (ref 1.7–7.7)
Neutrophils Relative %: 43 %
Platelets: 157 10*3/uL (ref 150–400)
RBC: 4.09 MIL/uL (ref 3.87–5.11)
RDW: 18.7 % — ABNORMAL HIGH (ref 11.5–15.5)
WBC: 3 10*3/uL — ABNORMAL LOW (ref 4.0–10.5)
nRBC: 0 % (ref 0.0–0.2)

## 2022-11-10 LAB — URINALYSIS, ROUTINE W REFLEX MICROSCOPIC
Bilirubin Urine: NEGATIVE
Glucose, UA: NEGATIVE mg/dL
Hgb urine dipstick: NEGATIVE
Ketones, ur: NEGATIVE mg/dL
Leukocytes,Ua: NEGATIVE
Nitrite: NEGATIVE
Protein, ur: NEGATIVE mg/dL
Specific Gravity, Urine: 1.002 — ABNORMAL LOW (ref 1.005–1.030)
pH: 5 (ref 5.0–8.0)

## 2022-11-10 LAB — PREGNANCY, URINE: Preg Test, Ur: NEGATIVE

## 2022-11-10 LAB — CREATININE, URINE, RANDOM: Creatinine, Urine: 15 mg/dL

## 2022-11-10 LAB — PROTIME-INR
INR: 1 (ref 0.8–1.2)
Prothrombin Time: 13.2 seconds (ref 11.4–15.2)

## 2022-11-10 LAB — OSMOLALITY: Osmolality: 308 mOsm/kg — ABNORMAL HIGH (ref 275–295)

## 2022-11-10 LAB — TROPONIN I (HIGH SENSITIVITY): Troponin I (High Sensitivity): 3 ng/L (ref <18)

## 2022-11-10 LAB — SARS CORONAVIRUS 2 BY RT PCR: SARS Coronavirus 2 by RT PCR: NEGATIVE

## 2022-11-10 LAB — BRAIN NATRIURETIC PEPTIDE: B Natriuretic Peptide: 24 pg/mL (ref 0.0–100.0)

## 2022-11-10 MED ORDER — MONTELUKAST SODIUM 10 MG PO TABS
10.0000 mg | ORAL_TABLET | Freq: Every day | ORAL | Status: DC
  Administered 2022-11-11 – 2022-11-12 (×3): 10 mg via ORAL
  Filled 2022-11-10 (×3): qty 1

## 2022-11-10 MED ORDER — GABAPENTIN 100 MG PO CAPS
100.0000 mg | ORAL_CAPSULE | Freq: Every evening | ORAL | Status: DC | PRN
  Administered 2022-11-11: 100 mg via ORAL
  Filled 2022-11-10: qty 1

## 2022-11-10 MED ORDER — THIAMINE MONONITRATE 100 MG PO TABS
100.0000 mg | ORAL_TABLET | Freq: Every day | ORAL | Status: DC
  Administered 2022-11-11 – 2022-11-13 (×3): 100 mg via ORAL
  Filled 2022-11-10 (×3): qty 1

## 2022-11-10 MED ORDER — PROMETHAZINE HCL 12.5 MG PO TABS: 12.5000 mg | ORAL_TABLET | Freq: Three times a day (TID) | ORAL | Status: DC | PRN

## 2022-11-10 MED ORDER — SODIUM CHLORIDE 0.9 % IV SOLN: INTRAVENOUS | Status: AC

## 2022-11-10 MED ORDER — FOLIC ACID 1 MG PO TABS
1.0000 mg | ORAL_TABLET | Freq: Every day | ORAL | Status: DC
  Administered 2022-11-10 – 2022-11-13 (×4): 1 mg via ORAL
  Filled 2022-11-10 (×4): qty 1

## 2022-11-10 MED ORDER — FAMOTIDINE 20 MG PO TABS
20.0000 mg | ORAL_TABLET | Freq: Every day | ORAL | Status: DC
  Administered 2022-11-10 – 2022-11-13 (×4): 20 mg via ORAL
  Filled 2022-11-10 (×4): qty 1

## 2022-11-10 MED ORDER — MORPHINE SULFATE (PF) 2 MG/ML IV SOLN
1.0000 mg | INTRAVENOUS | Status: DC | PRN
  Administered 2022-11-11 – 2022-11-13 (×10): 1 mg via INTRAVENOUS
  Filled 2022-11-10 (×10): qty 1

## 2022-11-10 MED ORDER — ALBUTEROL SULFATE HFA 108 (90 BASE) MCG/ACT IN AERS
2.0000 | INHALATION_SPRAY | RESPIRATORY_TRACT | Status: DC | PRN
  Administered 2022-11-10 – 2022-11-11 (×2): 2 via RESPIRATORY_TRACT
  Filled 2022-11-10 (×2): qty 6.7

## 2022-11-10 MED ORDER — ENOXAPARIN SODIUM 40 MG/0.4ML IJ SOSY
40.0000 mg | PREFILLED_SYRINGE | INTRAMUSCULAR | Status: DC
  Administered 2022-11-10 – 2022-11-12 (×3): 40 mg via SUBCUTANEOUS
  Filled 2022-11-10 (×3): qty 0.4

## 2022-11-10 MED ORDER — FLUTICASONE PROPIONATE 50 MCG/ACT NA SUSP
2.0000 | Freq: Every day | NASAL | Status: DC
  Administered 2022-11-11 – 2022-11-13 (×3): 2 via NASAL
  Filled 2022-11-10 (×2): qty 16

## 2022-11-10 MED ORDER — HYDRALAZINE HCL 20 MG/ML IJ SOLN: 5.0000 mg | INTRAMUSCULAR | Status: DC | PRN

## 2022-11-10 NOTE — H&P (Signed)
TRH H&P   Patient Demographics:    Alyssa Carey, is a 52 y.o. female  MRN: [id number]   DOB - 01/04/1971  Admit Date - 11/10/2022  Outpatient Primary MD for the patient is Richardean Chimera, MD  Referring MD/NP/PA: Dr Suezanne Jacquet    Patient coming from: home  Chief Complaint  Patient presents with   Shortness of Breath   Fever      HPI:    Alyssa Carey  is a 52 y.o. female, with past medical history of hypertension, hyperlipidemia, alcohol abuse, alcoholic hepatitis, history of necrotizing fasciitis in the past of her rectum, requiring subsequent ostomy, status post reversal last year. -Patient presents to ED secondary to shortness of breath and fever, patient reports she is having dyspnea for last 5 days, cough, as well subjective fever, she was prescribed doxycycline by her PCP, reports not able to lay flat due to dyspnea, as well she does report some leg swelling lower extremity as well, denies hemoptysis, chest pain, coffee-ground emesis, no dizziness or lightheadedness, patient is wearing cam boot due to left foot fracture.  She does report poor Oral intake and poor appetite, and diarrhea. -In ED her workup significant for hyponatremia sodium 124, hyperchloremia with sodium of 90, AST elevated at 286, ALT elevated at 84, but she denies any history of alcohol abuse or any use over last 6 months, NP stable at 24, [facility] hospitalist consulted to admit    Review of systems:    A full 10 point Review of Systems was done, except as stated above, all other Review of Systems were negative.   With Past History of the following :    Past Medical History:  Diagnosis Date   Alcohol abuse    HTN (hypertension) 10/05/2020   Necrotizing fasciitis [facility])       Past Surgical History:  Procedure Laterality Date   BIOPSY  10/07/2020   Procedure: BIOPSY;  Surgeon: Corbin Ade, MD;   Location: [facility];  Service: Endoscopy;;   COLONOSCOPY WITH PROPOFOL N/A 10/07/2020   single healing rectal ulcer in distal rectum with surrounding mucosal friability and markedly abnormal proximal colon with ulceration s/p biopsies.   COLONOSCOPY WITH PROPOFOL  02/22/2021   Procedure: COLONOSCOPY WITH PROPOFOL;  Surgeon: Lanelle Bal, DO;  Location: [facility];  Service: Endoscopy;;   COLOSTOMY TAKEDOWN N/A 01/26/2022   Procedure: LAPAROSCOPIC COLOSTOMY TAKEDOWN;  Surgeon: Romie Levee, MD;  Location: [facility];  Service: General;  Laterality: N/A;  laparoscopic assisted colostomy reversal and partial colectomy   ESOPHAGOGASTRODUODENOSCOPY (EGD) WITH PROPOFOL N/A 10/07/2020   non-bleeding gastric ulcer . Pathology with H.pylori negative   ESOPHAGOGASTRODUODENOSCOPY (EGD) WITH PROPOFOL N/A 12/27/2020   Procedure: ESOPHAGOGASTRODUODENOSCOPY (EGD) WITH PROPOFOL;  Surgeon: Lanelle Bal, DO;  Location: [facility];  Service: Endoscopy;  Laterality: N/A;  1:00pm   FLEXIBLE SIGMOIDOSCOPY N/A 01/11/2020  Procedure: FLEXIBLE SIGMOIDOSCOPY;  Surgeon: Jeani Hawking, MD;  Location: [facility];  Service: Gastroenterology;  Laterality: N/A;   INCISION AND DRAINAGE PERIRECTAL ABSCESS N/A 12/25/2019   Procedure: IRRIGATION AND DEBRIDEMENT BUTTOCKS, LAP LOOP COLOSTOMY;  Surgeon: Gaynelle Adu, MD;  Location: [facility];  Service: General;  Laterality: N/A;   IRRIGATION AND DEBRIDEMENT ABSCESS N/A 12/23/2019   Procedure: EXCISION AND DEBRIDEMENT LEFT BUTTOCK AND PERINEUM;  Surgeon: Berna Bue, MD;  Location: [facility];  Service: General;  Laterality: N/A;   LAPAROSCOPIC LOOP COLOSTOMY N/A 12/25/2019   Procedure: LAPAROSCOPIC LOOP COLOSTOMY;  Surgeon: Gaynelle Adu, MD;  Location: [facility];  Service: General;  Laterality: N/A;   TONSILLECTOMY        Social History:     Social History   Tobacco Use   Smoking status: Never   Smokeless tobacco: Never  Substance Use Topics   Alcohol use:  Not Currently     Family History :     Family History  Problem Relation Age of Onset   Colon polyps Mother        does not believe adenomas   Arrhythmia Mother    Colon cancer Neg Hx      Home Medications:   Prior to Admission medications   Medication Sig Start Date End Date Taking? Authorizing Provider  benzonatate (TESSALON) 100 MG capsule Take 100 mg by mouth 3 (three) times daily as needed. 11/06/22  Yes [provider]  cholecalciferol (VITAMIN D3) 25 MCG (1000 UNIT) tablet Take 1,000 Units by mouth daily.   Yes [provider]  doxycycline (VIBRA-TABS) 100 MG tablet Take 100 mg by mouth 2 (two) times daily. 7 day supply 11/07/22  Yes [provider]  famotidine (PEPCID) 20 MG tablet Take 20 mg by mouth daily. 11/11/21  Yes [provider]  fluticasone (FLONASE) 50 MCG/ACT nasal spray Place 2 sprays into both nostrils daily. 09/15/22  Yes [provider]  gabapentin (NEURONTIN) 100 MG capsule Take 100-200 capsules by mouth at bedtime as needed (pain). 11/04/22  Yes [provider]  hydrOXYzine (ATARAX) 25 MG tablet Take 25 mg by mouth as needed for itching. 09/28/22  Yes [provider]  ibuprofen (ADVIL) 800 MG tablet Take 800 mg by mouth 3 (three) times daily.   Yes [provider]  meloxicam (MOBIC) 7.5 MG tablet Take 7.5 mg by mouth daily.   Yes [provider]  montelukast (SINGULAIR) 10 MG tablet Take 10 mg by mouth at bedtime. 09/19/22  Yes [provider]  naproxen (NAPROSYN) 250 MG tablet Take 250 mg by mouth 2 (two) times daily. 10/27/22  Yes [provider]  promethazine (PHENERGAN) 25 MG tablet 1 tablet as needed for nausea or abdominal discomfort 01/12/22  Yes [provider]  psyllium (HYDROCIL/METAMUCIL) 95 % PACK Take 1 packet by mouth daily. Patient taking differently: Take 1 packet by mouth daily as needed for mild constipation. 08/28/21  Yes Tat, Onalee Hua, MD  vitamin C  (ASCORBIC ACID) 500 MG tablet Take 500 mg by mouth daily.   Yes [provider]  azithromycin (ZITHROMAX) 250 MG tablet as directed: 2 tabs on day 1, then take 1 tab daily Orally daily for 5 days Patient not taking: Reported on 11/10/2022 11/07/22   [provider]  predniSONE (DELTASONE) 10 MG tablet Take 20 mg by mouth daily. Patient not taking: Reported on 11/10/2022 11/06/22   [provider]  promethazine-dextromethorphan (PROMETHAZINE-DM) 6.25-15 MG/5ML syrup 5 mL as needed Orally every  6 hrs for 10 days Patient not taking: Reported on 11/10/2022 11/07/22   [provider]     Allergies:     Allergies  Allergen Reactions   Pantoprazole     Stomach pain   Penicillins Hives    Did it involve swelling of the face/tongue/throat, SOB, or low BP? N Did it involve sudden or severe rash/hives, skin peeling, or any reaction on the inside of your mouth or nose? Y Did you need to seek medical attention at a hospital or doctor's office? N When did it last happen?  Childhood     If all above answers are "NO", may proceed with cephalosporin use.    Oxycodone Hcl Rash     Physical Exam:   Vitals  Blood pressure 134/78, pulse 83, temperature 98.8 F (37.1 C), temperature source Oral, resp. rate 18, height 4\' 9"  (1.448 m), weight 80.7 kg, SpO2 100 %.   1. General developed female, laying in bed, lying in bed in NAD,    2. Normal affect and insight, Not Suicidal or Homicidal, Awake Alert, Oriented X 3.  3. No F.N deficits, ALL C.Nerves Intact, Strength 5/5 all 4 extremities, Sensation intact all 4 extremities, Plantars down going.  4. Ears and Eyes appear Normal, Conjunctivae clear, PERRLA. Moist Oral Mucosa.  5. Supple Neck, No JVD, No cervical lymphadenopathy appriciated, No Carotid Bruits.  6. Symmetrical Chest wall movement, Good air movement bilaterally, CTAB.  7. RRR, No Gallops, Rubs or Murmurs, No Parasternal Heave.   8. Positive Bowel Sounds,  Abdomen Soft, No tenderness, No organomegaly appriciated,No rebound -guarding or rigidity.  9.  No Cyanosis, Normal Skin Turgor, No Skin Rash or Bruise.  10. Good muscle tone,  joints appear normal , no effusions, Trace edema, left foot in a cam boot     Data Review:    CBC Recent Labs  Lab 11/10/22 1308  WBC 3.0*  HGB 11.3*  HCT 36.1  PLT 157  MCV 88.3  MCH 27.6  MCHC 31.3  RDW 18.7*  LYMPHSABS 1.1  MONOABS 0.4  EOSABS 0.1  BASOSABS 0.0   ------------------------------------------------------------------------------------------------------------------  Chemistries  Recent Labs  Lab 11/10/22 1308  NA 124*  K 3.9  CL 90*  CO2 21*  GLUCOSE 95  BUN 9  CREATININE 0.64  CALCIUM 8.6*  AST 286*  ALT 84*  ALKPHOS 165*  BILITOT 0.8   ------------------------------------------------------------------------------------------------------------------ estimated creatinine clearance is 74.4 mL/min (by C-G formula based on SCr of 0.64 mg/dL). ------------------------------------------------------------------------------------------------------------------ No results for input(s): "TSH", "T4TOTAL", "T3FREE", "THYROIDAB" in the last 72 hours.  Invalid input(s): "FREET3"  Coagulation profile Recent Labs  Lab 11/10/22 1616  INR 1.0   ------------------------------------------------------------------------------------------------------------------- No results for input(s): "DDIMER" in the last 72 hours. -------------------------------------------------------------------------------------------------------------------  Cardiac Enzymes No results for input(s): "CKMB", "TROPONINI", "MYOGLOBIN" in the last 168 hours.  Invalid input(s): "CK" ------------------------------------------------------------------------------------------------------------------    Component Value Date/Time   BNP 150.0 (H) 02/07/2022 2043      ---------------------------------------------------------------------------------------------------------------  Urinalysis    Component Value Date/Time   COLORURINE STRAW (A) 11/10/2022 1553   APPEARANCEUR CLEAR 11/10/2022 1553   LABSPEC 1.002 (L) 11/10/2022 1553   PHURINE 5.0 11/10/2022 1553   GLUCOSEU NEGATIVE 11/10/2022 1553   HGBUR NEGATIVE 11/10/2022 1553   BILIRUBINUR NEGATIVE 11/10/2022 1553   KETONESUR NEGATIVE 11/10/2022 1553   PROTEINUR NEGATIVE 11/10/2022 1553   NITRITE NEGATIVE 11/10/2022 1553   LEUKOCYTESUR NEGATIVE 11/10/2022 1553    ----------------------------------------------------------------------------------------------------------------   Imaging Results:    DG Chest 2  View  Result Date: 11/10/2022 CLINICAL DATA:  Shortness of breath EXAM: CHEST - 2 VIEW COMPARISON:  02/07/2022 FINDINGS: Mild cardiomegaly, which may be accentuated by low lung volumes. No focal pulmonary opacity. No pleural effusion or pneumothorax. No acute osseous abnormality. IMPRESSION: Mild cardiomegaly, which may be accentuated by low lung volumes. Electronically Signed   By: Wiliam Ke M.D.   On: 11/10/2022 12:42    EKG:  Vent. rate 83 BPM PR interval 150 ms QRS duration 88 ms QT/QTcB 360/423 ms P-R-T axes 51 -13 40 Normal sinus rhythm Possible Anterior infarct , age undetermined Abnormal ECG When compared with ECG of 07-Feb-2022 20:56,  Assessment & Plan:    Principal Problem:   Hyponatremia Active Problems:   Abnormal liver enzymes   Obesity (BMI 30-39.9)   Diarrhea with dehydration    Hyponatremia -Most likely due to volume depletion, dehydration, as she does report recent viral illness, with poor oral intake, as well she does report diarrhea -Will check urine sodium, urine is molality, and serum osmolality -Will start on IV NS  URI -Patient presents with cough, congestion, upper respiratory illness, will check viral panel  Diarrhea -Patient presents  with diarrhea, and upper respiratory illness, as well elevated LFTs, will check hepatitis panel, and GI panel if she develops diarrhea  Elevated LFTs -Patient denies any history of alcohol abuse, she reports total abstinence since for last 95-month, so unlikely alcoholic hepatitis, but elevated AST/ALT is typical for alcohol hepatic injury, for now we will continue to monitor closely, and I will check hepatitis panel as well, and if no improvement with appropriate hydration then likely will need to check right upper quadrant ultrasound -  she does not appear to be on any statin at home.  Alcohol abuse -She denies any history of alcohol use for last 8-month, will keep on IV thiamine and folic acid  Obesity Body mass index is 38.52 kg/m.   HTN -She is normotensive, not on any home medications, will keep on as needed hydralazine   Lower extremity edema -Mild, but given history of recent foot fracture will check venous Dopplers   DVT Prophylaxis Heparin   AM Labs Ordered, also please review Full Orders  Family Communication: Admission, patients condition and plan of care including tests being ordered have been discussed with the patient  who indicate understanding and agree with the plan and Code Status.  Code Status fULL  Likely DC to  HOME  Condition GUARDED    Consults called: none    Admission status: observation    Time spent in minutes : 70 minutes   Huey Bienenstock M.D on 11/10/2022 at 5:04 PM   [facility] - Office  [phone]

## 2022-11-10 NOTE — ED Notes (Signed)
Patient denies pain and is resting comfortably.  Up to BR and eating dinner  No distress.  Few scattered wheezes   requested inhaler

## 2022-11-10 NOTE — ED Notes (Signed)
Visting with vistors

## 2022-11-10 NOTE — ED Provider Notes (Signed)
[place] EMERGENCY DEPARTMENT AT [facility] Provider Note  CSN: [id number] Arrival date & time: 11/10/22 1204  Chief Complaint(s) Shortness of Breath and Fever  HPI Alyssa Carey is a 52 y.o. female prior history of alcohol abuse, liver disease, hyperlipidemia presenting to the emergency department with shortness of breath.  Patient reports shortness of breath for around the last 5 days.  She reports coughing.  Reports it is worse at night.  Does not have her lay flat so not sure if it is worse with lying flat.  She reports also some leg swelling over the past week.  She reports that she has had intermittent fevers and chills, also having some intermittent diarrhea.  She denies any ongoing alcohol abuse.  No weakness.  No seizures.  No abdominal pain or chest pain.  No lightheadedness or dizziness.  Reports she recently broke her left foot.  In walking boot.  Denies any calf pain.   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   Diarrhea with dehydration 09/03/2021   Hypomagnesemia 09/02/2021   Paroxysmal SVT (supraventricular tachycardia) 08/24/2021   Atrial flutter (HCC)    SBO (small bowel obstruction) (HCC)    Malnutrition of moderate degree 08/17/2021   Dehydration 08/10/2021   Swelling of lower extremity 04/21/2021   Normocytic anemia 04/09/2021   Hypokalemia 03/24/2021   Hypoalbuminemia due to protein-calorie malnutrition (HCC) 03/24/2021   Hypoglycemia 03/24/2021   Obesity (BMI 30.0-34.9) 03/24/2021   Serum total bilirubin elevated 03/24/2021   Anemia 03/24/2021   Prolonged QT interval 03/24/2021   Acute hepatitis    Alcohol use    Abnormal  liver enzymes 03/23/2021   PUD (peptic ulcer disease) 12/02/2020   Acute blood loss anemia 10/06/2020   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CC)    Elevated alkaline phosphatase level    Abdominal pain    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    Acute GI bleeding 10/05/2020   Transaminitis 10/05/2020   HTN (hypertension) 10/05/2020   Mild protein malnutrition (HCC) 10/05/2020   Stercoral ulcer of rectum 01/12/2020   Aphonia 01/12/2020   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   Vocal fold paresis, 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   Hypernatremia 01/12/2020   Anasarca 01/12/2020   Necrotizing fasciitis of pelvic region and thigh (HCC) 12/28/2019   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 Authorizing Provider  azithromycin (ZITHROMAX) 250 MG tablet as directed: 2 tabs on day 1, then take 1 tab daily Orally daily for 5 days 11/07/22  Yes [provider]  benzonatate (TESSALON) 100 MG capsule Take 100 mg by mouth 3 (three) times daily as needed. 11/06/22  Yes [provider]  cholecalciferol (VITAMIN D3) 25 MCG (1000 UNIT) tablet Take  1,000 Units by mouth daily.   Yes [provider]  doxycycline (VIBRA-TABS) 100 MG tablet Take 100 mg by mouth 2 (two) times daily. 11/06/22  Yes [provider]  fluticasone (FLONASE) 50 MCG/ACT nasal spray Place 2 sprays into both nostrils daily. 09/15/22  Yes [provider]  gabapentin (NEURONTIN) 100 MG capsule SMARTSIG:1-2 Capsule(s) By Mouth Every Evening PRN 11/04/22   Yes [provider]  hydrOXYzine (ATARAX) 25 MG tablet 1 tablet as needed Orally twice daily for 30 days 09/28/22  Yes [provider]  montelukast (SINGULAIR) 10 MG tablet 1 tablet Orally Once a day for 30 days 09/19/22  Yes [provider]  naproxen (NAPROSYN) 250 MG tablet Take 250 mg by mouth 2 (two) times daily. 10/27/22  Yes [provider]  predniSONE (DELTASONE) 10 MG tablet Take 20 mg by mouth daily. 11/06/22  Yes [provider]  promethazine-dextromethorphan (PROMETHAZINE-DM) 6.25-15 MG/5ML syrup 5 mL as needed Orally every 6 hrs for 10 days 11/07/22  Yes [provider]  famotidine (PEPCID) 20 MG tablet Take 20 mg by mouth daily. 11/11/21   [provider]  HYDROcodone-acetaminophen (NORCO/VICODIN) 5-325 MG tablet Take 1-2 tablets by mouth every 6 (six) hours as needed for moderate pain or severe pain. 02/03/22   Romie Levee, MD  ibuprofen (ADVIL) 800 MG tablet Take 800 mg by mouth 3 (three) times daily.    [provider]  meloxicam (MOBIC) 7.5 MG tablet 1 tablet Orally Once a day for 30 days    [provider]  naproxen (NAPROSYN) 500 MG tablet Take 1 tablet (500 mg total) by mouth 2 (two) times daily with a meal. 01/11/22   Eber Hong, MD  Nutritional Supplements (BOOST PO) Take 1 Bottle by mouth 4 (four) times daily.    [provider]  promethazine (PHENERGAN) 25 MG tablet 1 tablet as needed for nausea or abdominal discomfort 01/12/22   [provider]  psyllium (HYDROCIL/METAMUCIL) 95 % PACK Take 1 packet by mouth daily. Patient taking differently: Take 1 packet by mouth daily as needed for mild constipation. 08/28/21   Catarina Hartshorn, MD  vitamin C (ASCORBIC ACID) 500 MG tablet Take 500 mg by mouth daily.    [provider]                                                                                                                                    Past Surgical History Past  Surgical History:  Procedure Laterality Date   BIOPSY  10/07/2020   Procedure: BIOPSY;  Surgeon: Corbin Ade, MD;  Location: [facility];  Service: Endoscopy;;   COLONOSCOPY WITH PROPOFOL N/A 10/07/2020   single healing rectal ulcer in distal rectum with surrounding mucosal friability and markedly abnormal proximal colon with ulceration s/p biopsies.   COLONOSCOPY WITH PROPOFOL  02/22/2021   Procedure: COLONOSCOPY WITH PROPOFOL;  Surgeon: Lanelle Bal, DO;  Location: [facility];  Service: Endoscopy;;   COLOSTOMY TAKEDOWN N/A 01/26/2022   Procedure: LAPAROSCOPIC COLOSTOMY TAKEDOWN;  Surgeon: Romie Levee, MD;  Location: [facility] OR[facility];  Service: General;  Laterality: N/A;  laparoscopic assisted colostomy reversal and partial colectomy   ESOPHAGOGASTRODUODENOSCOPY (EGD) WITH PROPOFOL N/A 10/07/2020   non-bleeding gastric ulcer . Pathology with H.pylori negative   ESOPHAGOGASTRODUODENOSCOPY (EGD) WITH PROPOFOL N/A 12/27/2020   Procedure: ESOPHAGOGASTRODUODENOSCOPY (EGD) WITH PROPOFOL;  Surgeon: Lanelle Bal, DO;  Location: [facility];  Service: Endoscopy;  Laterality: N/A;  1:00pm   FLEXIBLE SIGMOIDOSCOPY N/A 01/11/2020   Procedure: FLEXIBLE SIGMOIDOSCOPY;  Surgeon: Jeani Hawking, MD;  Location: [facility];  Service: Gastroenterology;  Laterality: N/A;   INCISION AND DRAINAGE PERIRECTAL ABSCESS N/A 12/25/2019   Procedure: IRRIGATION AND DEBRIDEMENT BUTTOCKS, LAP LOOP COLOSTOMY;  Surgeon: Gaynelle Adu, MD;  Location: [facility];  Service: General;  Laterality: N/A;   IRRIGATION AND DEBRIDEMENT ABSCESS N/A 12/23/2019   Procedure: EXCISION AND DEBRIDEMENT LEFT BUTTOCK AND PERINEUM;  Surgeon: Berna Bue, MD;  Location: [facility];  Service: General;  Laterality: N/A;   LAPAROSCOPIC LOOP COLOSTOMY N/A 12/25/2019   Procedure: LAPAROSCOPIC LOOP COLOSTOMY;  Surgeon: Gaynelle Adu, MD;  Location: [facility];  Service: General;  Laterality: N/A;   TONSILLECTOMY     Family History Family  History  Problem Relation Age of Onset   Colon polyps Mother        does not believe adenomas   Arrhythmia Mother    Colon cancer Neg Hx     Social History Social History   Tobacco Use   Smoking status: Never   Smokeless tobacco: Never  Vaping Use   Vaping Use: Never used  Substance Use Topics   Alcohol use: Not Currently   Drug use: Not Currently   Allergies Pantoprazole, Penicillins, and Oxycodone hcl  Review of Systems Review of Systems  All other systems reviewed and are negative.   Physical Exam Vital Signs  I have reviewed the triage vital signs BP 134/78 (BP Location: Right Arm)   Pulse 83   Temp 98.8 F (37.1 C) (Oral)   Resp 18   Ht 4\' 9"  (1.448 m)   Wt 80.7 kg   SpO2 100%   BMI 38.52 kg/m  Physical Exam Vitals and nursing note reviewed.  Constitutional:      General: She is not in acute distress.    Appearance: She is well-developed.  HENT:     Head: Normocephalic and atraumatic.     Mouth/Throat:     Mouth: Mucous membranes are moist.  Eyes:     Pupils: Pupils are equal, round, and reactive to light.  Neck:     Vascular: Hepatojugular reflux present.  Cardiovascular:     Rate and Rhythm: Normal rate and regular rhythm.     Heart sounds: No murmur heard. Pulmonary:     Effort: Pulmonary effort is normal. No respiratory distress.     Breath sounds: Normal breath sounds.  Abdominal:     General: Abdomen is flat.     Palpations: Abdomen is soft.     Tenderness: There is no abdominal tenderness.  Musculoskeletal:        General: No tenderness.     Right lower leg: No tenderness. Edema present.     Left lower leg: No tenderness. Edema present.  Skin:    General: Skin is warm and dry.  Neurological:     General: No focal deficit present.     Mental  Status: She is alert. Mental status is at baseline.  Psychiatric:        Mood and Affect: Mood normal.        Behavior: Behavior normal.     ED Results and Treatments Labs (all labs  ordered are listed, but only abnormal results are displayed) Labs Reviewed  CBC WITH DIFFERENTIAL/PLATELET - Abnormal; Notable for the following components:      Result Value   WBC 3.0 (*)    Hemoglobin 11.3 (*)    RDW 18.7 (*)    Neutro Abs 1.3 (*)    All other components within normal limits  COMPREHENSIVE METABOLIC PANEL - Abnormal; Notable for the following components:   Sodium 124 (*)    Chloride 90 (*)    CO2 21 (*)    Calcium 8.6 (*)    AST 286 (*)    ALT 84 (*)    Alkaline Phosphatase 165 (*)    All other components within normal limits  URINALYSIS, ROUTINE W REFLEX MICROSCOPIC - Abnormal; Notable for the following components:   Color, Urine STRAW (*)    Specific Gravity, Urine 1.002 (*)    All other components within normal limits  SARS CORONAVIRUS 2 BY RT PCR  OSMOLALITY  SODIUM, URINE, RANDOM  UREA NITROGEN, URINE  CREATININE, URINE, RANDOM  PROTIME-INR  BRAIN NATRIURETIC PEPTIDE  TROPONIN I (HIGH SENSITIVITY)                                                                                                                          Radiology DG Chest 2 View  Result Date: 11/10/2022 CLINICAL DATA:  Shortness of breath EXAM: CHEST - 2 VIEW COMPARISON:  02/07/2022 FINDINGS: Mild cardiomegaly, which may be accentuated by low lung volumes. No focal pulmonary opacity. No pleural effusion or pneumothorax. No acute osseous abnormality. IMPRESSION: Mild cardiomegaly, which may be accentuated by low lung volumes. Electronically Signed   By: Wiliam Ke M.D.   On: 11/10/2022 12:42    Pertinent labs & imaging results that were available during my care of the patient were reviewed by me and considered in my medical decision making (see MDM for details).  Medications Ordered in ED Medications  albuterol (VENTOLIN HFA) 108 (90 Base) MCG/ACT inhaler 2 puff (has no administration in time range)  Procedures Procedures  (including critical care time)  Medical Decision Making / ED Course   MDM:  51 year old female presenting to the emergency department with cough.  Patient overall well-appearing, not hypoxic.  Lungs clear on exam.  Not in any respiratory distress.  Suspect symptoms are primarily related to possible viral URI.  Laboratory testing obtained in triage notable for significant hyponatremia to 124.  Patient denies any neurologic symptoms such as seizures or tremors.  Patient does have some mild bilateral lower extremity edema but does not appear floridly volume overloaded.  Albumin and renal function is normal.  Unclear cause of hyponatremia.  Chest x-ray does have mild cardiomegaly.  Will check labs including serum osm, urine electrolytes.  Will likely need admission for further workup and management of her hyponatremia.  Clinical Course as of 11/10/22 1639  Fri Nov 10, 2022  1636 Discussed with Dr. Randol Kern who will admit for hyponatremia workup and management [WS]    Clinical Course User Index [WS] Lonell Grandchild, MD     Additional history obtained:  -External records from outside source obtained and reviewed including: Chart review including previous notes, labs, imaging, consultation notes including prior er visits   Lab Tests: -I ordered, reviewed, and interpreted labs.   The pertinent results include:   Labs Reviewed  CBC WITH DIFFERENTIAL/PLATELET - Abnormal; Notable for the following components:      Result Value   WBC 3.0 (*)    Hemoglobin 11.3 (*)    RDW 18.7 (*)    Neutro Abs 1.3 (*)    All other components within normal limits  COMPREHENSIVE METABOLIC PANEL - Abnormal; Notable for the following components:   Sodium 124 (*)    Chloride 90 (*)    CO2 21 (*)    Calcium 8.6 (*)    AST 286 (*)    ALT 84 (*)    Alkaline Phosphatase 165 (*)    All other components within normal limits   URINALYSIS, ROUTINE W REFLEX MICROSCOPIC - Abnormal; Notable for the following components:   Color, Urine STRAW (*)    Specific Gravity, Urine 1.002 (*)    All other components within normal limits  SARS CORONAVIRUS 2 BY RT PCR  OSMOLALITY  SODIUM, URINE, RANDOM  UREA NITROGEN, URINE  CREATININE, URINE, RANDOM  PROTIME-INR  BRAIN NATRIURETIC PEPTIDE  TROPONIN I (HIGH SENSITIVITY)    Notable for transamnitis and hyponatremia  EKG   EKG Interpretation  Date/Time:  Friday November 10 2022 12:13:23 EDT Ventricular Rate:  83 PR Interval:  150 QRS Duration: 88 QT Interval:  360 QTC Calculation: 423 R Axis:   -13 Text Interpretation: Normal sinus rhythm Possible Anterior infarct , age undetermined Abnormal ECG When compared with ECG of 07-Feb-2022 20:56, PREVIOUS ECG IS PRESENT since last tracing no significant change Confirmed by Eber Hong ([id number]) on 11/10/2022 12:21:12 PM         Imaging Studies ordered: I ordered imaging studies including CXR On my interpretation imaging demonstrates no acute process I independently visualized and interpreted imaging. I agree with the radiologist interpretation   Medicines ordered and prescription drug management: Meds ordered this encounter  Medications   albuterol (VENTOLIN HFA) 108 (90 Base) MCG/ACT inhaler 2 puff    -I have reviewed the patients home medicines and have made adjustments as needed   Consultations Obtained: I requested consultation with the hospitalist,  and discussed lab and imaging findings as well as pertinent plan - they recommend: admission   Cardiac Monitoring:  The patient was maintained on a cardiac monitor.  I personally viewed and interpreted the cardiac monitored which showed an underlying rhythm of: NSR  Social Determinants of Health:  Diagnosis or treatment significantly limited by social determinants of health: obesity and alcohol use   Reevaluation: After the interventions noted above, I  reevaluated the patient and found that their symptoms have improved  Co morbidities that complicate the patient evaluation  Past Medical History:  Diagnosis Date   Alcohol abuse    HTN (hypertension) 10/05/2020   Necrotizing fasciitis (HCC)       Dispostion: Disposition decision including need for hospitalization was considered, and patient admitted to the hospital.    Final Clinical Impression(s) / ED Diagnoses Final diagnoses:  Hyponatremia     This chart was dictated using voice recognition software.  Despite best efforts to proofread,  errors can occur which can change the documentation meaning.    Lonell Grandchild, MD 11/10/22 [phone]

## 2022-11-10 NOTE — ED Triage Notes (Signed)
Pt presents with ShOB, cough, wheezing, diarrhea, and fever x 1 week. Pt states she has been to her PCP multiple times. Pt was given Rx, but pt does not know what she has been taking.

## 2022-11-10 NOTE — ED Notes (Signed)
Awaiting to see provider.

## 2022-11-10 NOTE — ED Provider Triage Note (Signed)
Emergency Medicine Provider Triage Evaluation Note  Alyssa Carey , a 52 y.o. female  was evaluated in triage.  Pt complains of 5-day history of cough productive of a green sputum along with shortness of breath, fever to 100.9, diarrhea.  Has been seen by her primary doctor, screened for COVID and flu, negative.  On day 2 of an unknown antibiotic without symptom improvement.  Endorses wheezing..  Review of Systems  Positive: Wheezing, short of breath, productive cough and fever, diarrhea. Negative: Chest pain, abdominal pain, nausea or vomiting.  Physical Exam  BP 134/78 (BP Location: Right Arm)   Pulse 83   Temp 98.8 F (37.1 C) (Oral)   Resp 18   Ht 4\' 9"  (1.448 m)   Wt 80.7 kg   SpO2 100%   BMI 38.52 kg/m  Gen:   Awake, no distress   Resp:  Normal effort  MSK:   Moves extremities without difficulty  Other:    Medical Decision Making  Medically screening exam initiated at 12:26 PM.  Appropriate orders placed.  Alyssa Carey was informed that the remainder of the evaluation will be completed by another provider, this initial triage assessment does not replace that evaluation, and the importance of remaining in the ED until their evaluation is complete.     Burgess Amor, PA-C 11/10/22 1228

## 2022-11-11 DIAGNOSIS — Z1152 Encounter for screening for COVID-19: Secondary | ICD-10-CM | POA: Diagnosis not present

## 2022-11-11 DIAGNOSIS — E860 Dehydration: Secondary | ICD-10-CM | POA: Diagnosis present

## 2022-11-11 DIAGNOSIS — Z880 Allergy status to penicillin: Secondary | ICD-10-CM | POA: Diagnosis not present

## 2022-11-11 DIAGNOSIS — Z6838 Body mass index (BMI) 38.0-38.9, adult: Secondary | ICD-10-CM | POA: Diagnosis not present

## 2022-11-11 DIAGNOSIS — B9789 Other viral agents as the cause of diseases classified elsewhere: Secondary | ICD-10-CM | POA: Diagnosis present

## 2022-11-11 DIAGNOSIS — I872 Venous insufficiency (chronic) (peripheral): Secondary | ICD-10-CM | POA: Diagnosis present

## 2022-11-11 DIAGNOSIS — Z79899 Other long term (current) drug therapy: Secondary | ICD-10-CM | POA: Diagnosis not present

## 2022-11-11 DIAGNOSIS — Z83719 Family history of colon polyps, unspecified: Secondary | ICD-10-CM | POA: Diagnosis not present

## 2022-11-11 DIAGNOSIS — J069 Acute upper respiratory infection, unspecified: Secondary | ICD-10-CM | POA: Diagnosis present

## 2022-11-11 DIAGNOSIS — J45909 Unspecified asthma, uncomplicated: Secondary | ICD-10-CM | POA: Diagnosis present

## 2022-11-11 DIAGNOSIS — R748 Abnormal levels of other serum enzymes: Secondary | ICD-10-CM | POA: Diagnosis not present

## 2022-11-11 DIAGNOSIS — E871 Hypo-osmolality and hyponatremia: Secondary | ICD-10-CM | POA: Diagnosis not present

## 2022-11-11 DIAGNOSIS — E785 Hyperlipidemia, unspecified: Secondary | ICD-10-CM | POA: Diagnosis present

## 2022-11-11 DIAGNOSIS — Z8739 Personal history of other diseases of the musculoskeletal system and connective tissue: Secondary | ICD-10-CM | POA: Diagnosis not present

## 2022-11-11 DIAGNOSIS — Z885 Allergy status to narcotic agent status: Secondary | ICD-10-CM | POA: Diagnosis not present

## 2022-11-11 DIAGNOSIS — Z888 Allergy status to other drugs, medicaments and biological substances status: Secondary | ICD-10-CM | POA: Diagnosis not present

## 2022-11-11 DIAGNOSIS — R197 Diarrhea, unspecified: Secondary | ICD-10-CM | POA: Diagnosis not present

## 2022-11-11 DIAGNOSIS — R7401 Elevation of levels of liver transaminase levels: Secondary | ICD-10-CM | POA: Diagnosis present

## 2022-11-11 DIAGNOSIS — J219 Acute bronchiolitis, unspecified: Secondary | ICD-10-CM | POA: Diagnosis present

## 2022-11-11 DIAGNOSIS — E878 Other disorders of electrolyte and fluid balance, not elsewhere classified: Secondary | ICD-10-CM | POA: Diagnosis present

## 2022-11-11 DIAGNOSIS — F1010 Alcohol abuse, uncomplicated: Secondary | ICD-10-CM | POA: Diagnosis present

## 2022-11-11 DIAGNOSIS — Z8711 Personal history of peptic ulcer disease: Secondary | ICD-10-CM | POA: Diagnosis not present

## 2022-11-11 DIAGNOSIS — I10 Essential (primary) hypertension: Secondary | ICD-10-CM | POA: Diagnosis present

## 2022-11-11 DIAGNOSIS — E669 Obesity, unspecified: Secondary | ICD-10-CM | POA: Diagnosis not present

## 2022-11-11 LAB — CBC
HCT: 28.7 % — ABNORMAL LOW (ref 36.0–46.0)
Hemoglobin: 9.2 g/dL — ABNORMAL LOW (ref 12.0–15.0)
MCH: 27.7 pg (ref 26.0–34.0)
MCHC: 32.1 g/dL (ref 30.0–36.0)
MCV: 86.4 fL (ref 80.0–100.0)
Platelets: 145 10*3/uL — ABNORMAL LOW (ref 150–400)
RBC: 3.32 MIL/uL — ABNORMAL LOW (ref 3.87–5.11)
RDW: 18.9 % — ABNORMAL HIGH (ref 11.5–15.5)
WBC: 3.4 10*3/uL — ABNORMAL LOW (ref 4.0–10.5)
nRBC: 0 % (ref 0.0–0.2)

## 2022-11-11 LAB — COMPREHENSIVE METABOLIC PANEL
ALT: 68 U/L — ABNORMAL HIGH (ref 0–44)
AST: 186 U/L — ABNORMAL HIGH (ref 15–41)
Albumin: 3.1 g/dL — ABNORMAL LOW (ref 3.5–5.0)
Alkaline Phosphatase: 146 U/L — ABNORMAL HIGH (ref 38–126)
Anion gap: 7 (ref 5–15)
BUN: 9 mg/dL (ref 6–20)
CO2: 23 mmol/L (ref 22–32)
Calcium: 8.5 mg/dL — ABNORMAL LOW (ref 8.9–10.3)
Chloride: 102 mmol/L (ref 98–111)
Creatinine, Ser: 0.48 mg/dL (ref 0.44–1.00)
GFR, Estimated: >60 mL/min (ref >60)
Glucose, Bld: 103 mg/dL — ABNORMAL HIGH (ref 70–99)
Potassium: 3.8 mmol/L (ref 3.5–5.1)
Sodium: 132 mmol/L — ABNORMAL LOW (ref 135–145)
Total Bilirubin: 0.7 mg/dL (ref 0.3–1.2)
Total Protein: 6.2 g/dL — ABNORMAL LOW (ref 6.5–8.1)

## 2022-11-11 LAB — RESPIRATORY PANEL BY PCR

## 2022-11-11 LAB — PROTIME-INR
INR: 1.1 (ref 0.8–1.2)
Prothrombin Time: 13.6 seconds (ref 11.4–15.2)

## 2022-11-11 LAB — HIV ANTIBODY (ROUTINE TESTING W REFLEX): HIV Screen 4th Generation wRfx: NONREACTIVE

## 2022-11-11 MED ORDER — LORATADINE 10 MG PO TABS
10.0000 mg | ORAL_TABLET | Freq: Every day | ORAL | Status: DC
  Administered 2022-11-11 – 2022-11-13 (×3): 10 mg via ORAL
  Filled 2022-11-11 (×3): qty 1

## 2022-11-11 MED ORDER — IPRATROPIUM-ALBUTEROL 0.5-2.5 (3) MG/3ML IN SOLN
3.0000 mL | Freq: Four times a day (QID) | RESPIRATORY_TRACT | Status: DC | PRN
  Administered 2022-11-11 – 2022-11-13 (×5): 3 mL via RESPIRATORY_TRACT
  Filled 2022-11-11 (×5): qty 3

## 2022-11-11 MED ORDER — DEXTROMETHORPHAN POLISTIREX ER 30 MG/5ML PO SUER
30.0000 mg | Freq: Two times a day (BID) | ORAL | Status: DC | PRN
  Administered 2022-11-11 – 2022-11-12 (×3): 30 mg via ORAL
  Filled 2022-11-11 (×3): qty 5

## 2022-11-11 MED ORDER — DIPHENHYDRAMINE HCL 25 MG PO CAPS
25.0000 mg | ORAL_CAPSULE | Freq: Once | ORAL | Status: AC
  Administered 2022-11-11: 25 mg via ORAL
  Filled 2022-11-11: qty 1

## 2022-11-11 MED ORDER — HYDROCORTISONE (PERIANAL) 2.5 % EX CREA
TOPICAL_CREAM | Freq: Two times a day (BID) | CUTANEOUS | Status: DC
  Filled 2022-11-11: qty 28.35

## 2022-11-11 NOTE — Progress Notes (Signed)
Progress Note   Patient: Alyssa Carey:[id number] DOB: 1971/06/18 DOA: 11/10/2022     0 DOS: the patient was seen and examined on 11/11/2022   Brief hospital admission course: As per H&P written by Dr. Randol Kern on 11/10/2022  Saphia Vanderford  is a 52 y.o. female, with past medical history of hypertension, hyperlipidemia, alcohol abuse, alcoholic hepatitis, history of necrotizing fasciitis in the past of her rectum, requiring subsequent ostomy, status post reversal last year. -Patient presents to ED secondary to shortness of breath and fever, patient reports she is having dyspnea for last 5 days, cough, as well subjective fever, she was prescribed doxycycline by her PCP, reports not able to lay flat due to dyspnea, as well she does report some leg swelling lower extremity as well, denies hemoptysis, chest pain, coffee-ground emesis, no dizziness or lightheadedness, patient is wearing cam boot due to left foot fracture.  She does report poor Oral intake and poor appetite, and diarrhea. -In ED her workup significant for hyponatremia sodium 124, hypochloremia 90, bicarb 21, BUN 9 and creatinine 0.64; there is present of AST and ALT elevation with alkaline phosphatase of 165 and total bilirubin 0.8.  Assessment and Plan: 1-hyponatremia/dehydration -In the setting of volume depletion from GI losses and poor oral intake. -Continue gentle fluid resuscitation -Advance diet as tolerated -Continue to follow electrolytes trend and further replete as needed.  2-Upper respiratory infection -Appears to be vital make sure with associated reactive airway disease -Continue bronchodilator management -Continue holding on antibiotic therapy -Continue supportive care and follow clinical response. -Claritin and Flonase has been added to regimen postnasal drip.  3-history of alcohol abuse -No withdrawal symptoms appreciated -Patient reports complete abstinence for the last 6 months -Congratulated and  encouraged to maintain herself alcohol free -Continue the use of thiamine and folic acid.  4-transaminitis -Appears to be in the setting of dehydration possible fatty liver changes -Just with fluid resuscitation significant improvement appreciated overnight -Will continue to follow trend -Hepatitis panel negative  5-hypertension -No using any antihypertensive agents currently -Heart healthy diet discussed with patient -Continue as needed hydralazine -Continue to follow vital signs.  6-class II obesity -Body mass index is 38.52 kg/m. -Low-calorie diet, portion control and increase physical activity discussed with patient.  7-lower extremity edema -In the setting of venous insufficiency and most likely component from patient obesity -Negative DVT on lower extremity Dopplers examination -Continue to keep legs elevated -Continue to follow clinical response.  Subjective:  Reporting chest tightness and difficulty breathing; Expiratory wheezing appreciated on exam.  No using accessory muscles and good saturation on room air.  Reports diarrhea and abdominal discomfort has improved.  Physical Exam: Vitals:   11/11/22 0700 11/11/22 0900 11/11/22 1000 11/11/22 1515  BP:  116/71  114/79  Pulse:  90  95  Resp: 18 20 19 20   Temp:  98.3 F (36.8 C)  98.2 F (36.8 C)  TempSrc:  Oral  Oral  SpO2:  98%  100%  Weight:      Height:       General exam: Alert, awake, oriented x 3; no requiring oxygen supplementation but feeling chest tightness, wheezing, dyspnea on exertion. Respiratory system: Scattered wheezing, no using accessory muscles; good saturation on room air. Cardiovascular system: Sinus tachycardia. No rubs or gallops; Gastrointestinal system: Abdomen is obese, nondistended, soft and nontender. No organomegaly or masses felt. Normal bowel sounds heard. Central nervous system: Alert and oriented. No focal neurological deficits. Extremities: No cyanosis or clubbing. Skin: No  petechiae.  Psychiatry: Judgement and insight appear normal. Mood & affect appropriate.   Data Reviewed: Comprehensive metabolic panel: Sodium 132, potassium 3.8, chloride 102, bicarb 23, BUN 9, creatinine 0.48, LFTs trending down with AST of 186, ALT 68 and alk phos 146.  GFR> 60  Family Communication: No family at bedside.  Disposition: Status is: Inpatient Remains inpatient appropriate because: Continue fluid resuscitation, electrolyte repletion and management for ongoing upper reactive airway disease in the setting of URI.   Planned Discharge Destination: Home   Time spent: 35 minutes  Author: Vassie Loll, MD 11/11/2022 4:42 PM  For on call review [email or web address].

## 2022-11-11 NOTE — TOC Initial Note (Signed)
Transition of Care [facility]) - Initial/Assessment Note    Patient Details  Name: Alyssa Carey MRN: [id number] Date of Birth: 19-Jul-1970  Transition of Care [facility]) CM/SW Contact:    Catalina Gravel, LCSW Phone Number: 11/11/2022, 4:37 PM  Clinical Narrative:                 Pt in pbs for SOB. Assessed, lives with adult son. Drives to medical apps. Herself.  No assis with mobility needed. DC Sunday.  Expected Discharge Plan: Home/Self Care Barriers to Discharge: Continued Medical Work up   Patient Goals and CMS Choice Patient states their goals for this hospitalization and ongoing recovery are:: Return home.          Expected Discharge Plan and Services       Living arrangements for the past 2 months: Apartment                                      Prior Living Arrangements/Services Living arrangements for the past 2 months: Apartment Lives with:: Adult Children (Lives with son)          Need for Family Participation in Patient Care: Yes (Comment) Care giver support system in place?: Yes (comment) (Lives with son)      Activities of Daily Living      Permission Sought/Granted                  Emotional Assessment     Affect (typically observed): Appropriate Orientation: : Oriented to Self, Oriented to Place, Oriented to Situation Alcohol / Substance Use: Other (comment) (Reports no alcohol in last 6 months)    Admission diagnosis:  Hyponatremia [E87.1] Patient Active Problem List   Diagnosis Date Noted   Hyponatremia 11/10/2022   Obesity, Class III, BMI 40-49.9 (morbid obesity) (HCC) 01/29/2022   Alcohol withdrawal (HCC) 01/29/2022   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   Diarrhea with dehydration 09/03/2021   Hypomagnesemia 09/02/2021   Paroxysmal SVT (supraventricular tachycardia) 08/24/2021   Atrial flutter (HCC)    SBO (small bowel obstruction) (HCC)    Malnutrition of moderate degree 08/17/2021   Dehydration 08/10/2021   Swelling of  lower extremity 04/21/2021   Normocytic anemia 04/09/2021   Hypokalemia 03/24/2021   Hypoalbuminemia due to protein-calorie malnutrition (HCC) 03/24/2021   Hypoglycemia 03/24/2021   Obesity (BMI 30-39.9) 03/24/2021   Serum total bilirubin elevated 03/24/2021   Anemia 03/24/2021   Prolonged QT interval 03/24/2021   Acute hepatitis    Alcohol use    Abnormal liver enzymes 03/23/2021   PUD (peptic ulcer disease) 12/02/2020   Acute blood loss anemia 10/06/2020   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CC)    Elevated alkaline phosphatase level    Abdominal pain    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    Acute GI bleeding 10/05/2020   Transaminitis 10/05/2020   HTN (hypertension) 10/05/2020   Mild protein malnutrition (HCC) 10/05/2020   Stercoral ulcer of rectum 01/12/2020   Aphonia 01/12/2020   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   Vocal fold paresis, 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   Hypernatremia 01/12/2020   Anasarca 01/12/2020   Necrotizing fasciitis of pelvic region and thigh (HCC) 12/28/2019   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   Acute respiratory failure with hypoxemia (HCC)    Alcoholic hepatitis without ascites    Pressure injury of skin 12/22/2019   Fall at home, initial encounter 12/21/2019   Weakness  12/21/2019   Alcoholism (HCC) 12/21/2019   Thrombocytopenia (HCC) 12/21/2019   ARF (acute renal failure) (HCC) 12/21/2019   Hepatic encephalopathy 12/21/2019   Alcoholic liver disease (HCC) 12/21/2019   PCP:  Richardean Chimera, MD Pharmacy:   [facility] [address], [place] - [address] [place] [address] Phone: [phone] Fax: [phone]     Social Determinants of Health (SDOH) Social History: SDOH Screenings   Tobacco Use: Low Risk  (11/10/2022)   SDOH Interventions:     Readmission Risk Interventions    02/01/2022    2:28 PM 03/24/2021   12:50 PM  Readmission Risk Prevention Plan  Transportation Screening Complete Complete  PCP or  Specialist Appt within 5-7 Days Complete   Home Care Screening Complete   Medication Review (RN CM) Complete   HRI or Home Care Consult  Complete  Social Work Consult for Recovery Care Planning/Counseling  Complete  Palliative Care Screening  Not Applicable  Medication Review Oceanographer)  Complete

## 2022-11-12 DIAGNOSIS — E871 Hypo-osmolality and hyponatremia: Secondary | ICD-10-CM | POA: Diagnosis not present

## 2022-11-12 DIAGNOSIS — R748 Abnormal levels of other serum enzymes: Secondary | ICD-10-CM | POA: Diagnosis not present

## 2022-11-12 DIAGNOSIS — R197 Diarrhea, unspecified: Secondary | ICD-10-CM | POA: Diagnosis not present

## 2022-11-12 DIAGNOSIS — E669 Obesity, unspecified: Secondary | ICD-10-CM | POA: Diagnosis not present

## 2022-11-12 LAB — UREA NITROGEN, URINE: Urea Nitrogen, Ur: 89 mg/dL

## 2022-11-12 MED ORDER — DIPHENHYDRAMINE HCL 25 MG PO CAPS
25.0000 mg | ORAL_CAPSULE | Freq: Three times a day (TID) | ORAL | Status: DC | PRN
  Administered 2022-11-12: 25 mg via ORAL
  Filled 2022-11-12: qty 1

## 2022-11-12 MED ORDER — GERHARDT'S BUTT CREAM
TOPICAL_CREAM | Freq: Four times a day (QID) | CUTANEOUS | Status: DC | PRN
  Filled 2022-11-12: qty 1

## 2022-11-12 MED ORDER — PREDNISONE 20 MG PO TABS
40.0000 mg | ORAL_TABLET | Freq: Every day | ORAL | Status: DC
  Administered 2022-11-12 – 2022-11-13 (×2): 40 mg via ORAL
  Filled 2022-11-12 (×2): qty 2

## 2022-11-12 MED ORDER — HYDROCORTISONE 1 % EX CREA
TOPICAL_CREAM | Freq: Two times a day (BID) | CUTANEOUS | Status: DC
  Filled 2022-11-12 (×2): qty 28

## 2022-11-12 MED ORDER — BUDESONIDE 0.25 MG/2ML IN SUSP
0.2500 mg | Freq: Two times a day (BID) | RESPIRATORY_TRACT | Status: DC
  Administered 2022-11-12 – 2022-11-13 (×2): 0.25 mg via RESPIRATORY_TRACT
  Filled 2022-11-12 (×2): qty 2

## 2022-11-12 MED ORDER — DOXYCYCLINE HYCLATE 100 MG PO TABS
100.0000 mg | ORAL_TABLET | Freq: Two times a day (BID) | ORAL | Status: DC
  Administered 2022-11-12 – 2022-11-13 (×2): 100 mg via ORAL
  Filled 2022-11-12 (×2): qty 1

## 2022-11-12 NOTE — Plan of Care (Signed)
  Problem: Clinical Measurements: Goal: Diagnostic test results will improve Outcome: Not Progressing   Problem: Clinical Measurements: Goal: Respiratory complications will improve Outcome: Not Progressing   Problem: Elimination: Goal: Will not experience complications related to bowel motility Outcome: Not Progressing

## 2022-11-12 NOTE — Progress Notes (Signed)
Progress Note   Patient: Alyssa Carey:[id number] DOB: 1970-10-12 DOA: 11/10/2022     1 DOS: the patient was seen and examined on 11/12/2022   Brief hospital admission course: As per H&P written by Dr. Randol Kern on 11/10/2022  Alyssa Carey  is a 52 y.o. female, with past medical history of hypertension, hyperlipidemia, alcohol abuse, alcoholic hepatitis, history of necrotizing fasciitis in the past of her rectum, requiring subsequent ostomy, status post reversal last year. -Patient presents to ED secondary to shortness of breath and fever, patient reports she is having dyspnea for last 5 days, cough, as well subjective fever, she was prescribed doxycycline by her PCP, reports not able to lay flat due to dyspnea, as well she does report some leg swelling lower extremity as well, denies hemoptysis, chest pain, coffee-ground emesis, no dizziness or lightheadedness, patient is wearing cam boot due to left foot fracture.  She does report poor Oral intake and poor appetite, and diarrhea. -In ED her workup significant for hyponatremia sodium 124, hypochloremia 90, bicarb 21, BUN 9 and creatinine 0.64; there is present of AST and ALT elevation with alkaline phosphatase of 165 and total bilirubin 0.8.  Assessment and Plan: 1-hyponatremia/dehydration -In the setting of volume depletion from GI losses and poor oral intake. -Continue gentle fluid resuscitation -Diet has been advanced and for the most part well-tolerated. -Continue to follow electrolytes trend and further replete as needed.  2-Upper respiratory infection secondary to parainfluenza 3 and most likely component of bacterial bronchitis/bronchiolitis. -Appears to be viral, with component of bacterial infection and associated reactive airway disease -Continue bronchodilator management -Start treatment with steroids and doxycycline -Continue to maintain adequate hydration and provide supportive care. -Continue Claritin and Flonase has been  added to regimen postnasal drip.  3-history of alcohol abuse -No withdrawal symptoms appreciated -Patient reports complete abstinence for the last 6 months -Congratulated and encouraged to maintain herself alcohol free -Continue the use of thiamine and folic acid.  4-transaminitis -Appears to be in the setting of dehydration possible fatty liver changes -Continue to maintain adequate hydration. -Minimize the use of hepatotoxic agents. -Will continue to follow trend intermittently. -Hepatitis panel negative  5-hypertension -No using any antihypertensive agents currently -Heart healthy diet discussed with patient -Continue as needed hydralazine -Continue to follow vital signs.  6-class II obesity -Body mass index is 38.52 kg/m. -Low-calorie diet, portion control and increase physical activity discussed with patient.  7-lower extremity edema -In the setting of venous insufficiency and most likely component from patient obesity -Negative DVT on lower extremity Dopplers examination -Continue to keep legs elevated -Continue to follow clinical response.  Subjective:  Reporting some improvement in her GI symptoms; still short of breath, experiencing productive coughing spells and expiratory wheezing.  Physical Exam: Vitals:   11/11/22 2027 11/12/22 0254 11/12/22 0753 11/12/22 1310  BP: (!) 141/101 127/68  (!) 155/103  Pulse: 99 88  (!) 108  Resp: 18 18  16   Temp: 98.8 F (37.1 C) 98.2 F (36.8 C)  97.9 F (36.6 C)  TempSrc: Oral Oral    SpO2: 98% 98% 98% 98%  Weight:      Height:       General exam: Alert, awake, oriented x 3; reporting dyspnea on exertion, intermittent productive coughing spells and wheezing. Respiratory system: Positive cough and expiratory wheezing appreciated on exam; not requiring oxygen supplementation. Cardiovascular system: Rate controlled, no rubs, no gallops, no JVD. Gastrointestinal system: Abdomen is obese, nondistended, soft and nontender. No  organomegaly or masses  felt. Normal bowel sounds heard. Central nervous system: Alert and oriented. No focal neurological deficits. Extremities: No cyanosis or clubbing. Skin: No petechiae. Psychiatry: Judgement and insight appear normal. Mood & affect appropriate.   Latest data Reviewed: Comprehensive metabolic panel: Sodium 132, potassium 3.8, chloride 102, bicarb 23, BUN 9, creatinine 0.48, LFTs trending down with AST of 186, ALT 68 and alk phos 146.  GFR> 60  Family Communication: No family at bedside.  Disposition: Status is: Inpatient Remains inpatient appropriate because: Continue fluid resuscitation, electrolyte repletion and management for ongoing upper reactive airway disease in the setting of URI.   Planned Discharge Destination: Home    Time spent: 35 minutes  Author: Vassie Loll, MD 11/12/2022 4:17 PM  For on call review [email or web address].

## 2022-11-13 DIAGNOSIS — E871 Hypo-osmolality and hyponatremia: Secondary | ICD-10-CM | POA: Diagnosis not present

## 2022-11-13 DIAGNOSIS — E669 Obesity, unspecified: Secondary | ICD-10-CM | POA: Diagnosis not present

## 2022-11-13 DIAGNOSIS — J4521 Mild intermittent asthma with (acute) exacerbation: Secondary | ICD-10-CM

## 2022-11-13 DIAGNOSIS — R748 Abnormal levels of other serum enzymes: Secondary | ICD-10-CM | POA: Diagnosis not present

## 2022-11-13 DIAGNOSIS — J45909 Unspecified asthma, uncomplicated: Secondary | ICD-10-CM

## 2022-11-13 DIAGNOSIS — F1010 Alcohol abuse, uncomplicated: Secondary | ICD-10-CM

## 2022-11-13 DIAGNOSIS — J219 Acute bronchiolitis, unspecified: Secondary | ICD-10-CM

## 2022-11-13 DIAGNOSIS — B348 Other viral infections of unspecified site: Secondary | ICD-10-CM

## 2022-11-13 DIAGNOSIS — R197 Diarrhea, unspecified: Secondary | ICD-10-CM

## 2022-11-13 MED ORDER — GERHARDT'S BUTT CREAM: 1.0000 | TOPICAL_CREAM | Freq: Four times a day (QID) | CUTANEOUS | Status: AC | PRN

## 2022-11-13 MED ORDER — PREDNISONE 20 MG PO TABS: ORAL_TABLET | ORAL | 0 refills | Status: DC

## 2022-11-13 MED ORDER — GABAPENTIN 100 MG PO CAPS: 100.0000 mg | ORAL_CAPSULE | Freq: Every evening | ORAL | Status: DC | PRN

## 2022-11-13 MED ORDER — LORATADINE 10 MG PO TABS: 10.0000 mg | ORAL_TABLET | Freq: Every day | ORAL | 1 refills | Status: AC

## 2022-11-13 MED ORDER — DOXYCYCLINE HYCLATE 100 MG PO TABS: 100.0000 mg | ORAL_TABLET | Freq: Two times a day (BID) | ORAL | 0 refills | Status: AC

## 2022-11-13 MED ORDER — IPRATROPIUM-ALBUTEROL 20-100 MCG/ACT IN AERS: 1.0000 | INHALATION_SPRAY | Freq: Four times a day (QID) | RESPIRATORY_TRACT | 2 refills | Status: DC | PRN

## 2022-11-13 NOTE — Progress Notes (Signed)
Patient was given discharge instructions on medications and follow up visits,patient verbalized understanding. Prescriptions sent to Pharmacy of choice documented on AVS. IV discontinued,catheter intact. Accompanied by staff  to an awaiting vehicle.

## 2022-11-13 NOTE — Discharge Summary (Signed)
Physician Discharge Summary   Patient: Alyssa Carey MRN: [id number] DOB: 04/23/1971  Admit date:     11/10/2022  Discharge date: 11/13/22  Discharge Physician: Vassie Loll   PCP: Richardean Chimera, MD   Recommendations at discharge:  Complete metabolic panel to follow electrolytes, renal function and LFTs. Continue assisting patient with alcohol cessation and complete abstinence.   Reassess blood pressure and start treatment with antihypertensive agents as needed Assist patient with weight loss management.  Discharge Diagnoses: Principal Problem:   Hyponatremia Active Problems:   Abnormal liver enzymes   Obesity (BMI 30-39.9)   Diarrhea with dehydration   Parainfluenza infection   Bronchiolitis   Alcohol abuse   Reactive airway disease Hypertension  Brief hospital admission course: As per H&P written by Dr. Randol Kern on 11/10/2022  Alyssa Carey  is a 52 y.o. female, with past medical history of hypertension, hyperlipidemia, alcohol abuse, alcoholic hepatitis, history of necrotizing fasciitis in the past of her rectum, requiring subsequent ostomy, status post reversal last year. -Patient presents to ED secondary to shortness of breath and fever, patient reports she is having dyspnea for last 5 days, cough, as well subjective fever, she was prescribed doxycycline by her PCP, reports not able to lay flat due to dyspnea, as well she does report some leg swelling lower extremity as well, denies hemoptysis, chest pain, coffee-ground emesis, no dizziness or lightheadedness, patient is wearing cam boot due to left foot fracture.  She does report poor Oral intake and poor appetite, and diarrhea. -In ED her workup significant for hyponatremia sodium 124, hypochloremia 90, bicarb 21, BUN 9 and creatinine 0.64; there is present of AST and ALT elevation with alkaline phosphatase of 165 and total bilirubin 0.8.  Assessment and Plan: 1-hyponatremia/dehydration -In the setting of volume  depletion from GI losses and poor oral intake. -Continue to maintain adequate hydration. -Diet has been advanced and for the most part well-tolerated. -Continue to follow electrolytes trend and further replete as needed.   2-Upper respiratory infection secondary to parainfluenza 3 and most likely component of bacterial bronchitis/bronchiolitis. -Appears to be viral, with component of bacterial infection and associated reactive airway disease -Patient has been started on a steroids tapering and the use of doxycycline; excellent response appreciated prior to discharge. -Continue to maintain adequate hydration and continue the use of as needed bronchodilator management. -Continue Claritin and Flonase.   3-history of alcohol abuse -No withdrawal symptoms appreciated -Patient reports complete abstinence for the last 6 months -Patient has been encouraged to continue complete alcohol abstinence. -Daily multivitamin for supplementation of thiamine and folic acid recommended.   4-transaminitis -Appears to be in the setting of dehydration possible fatty liver changes -Continue to maintain adequate hydration. -Minimize the use of hepatotoxic agents. -Hepatitis panel negative -Repeat complete metabolic panel at follow-up visit to assess trend/stability. -Patient advised to maintain complete alcohol abstinence.   5-hypertension -No using any antihypertensive agents currently -Heart healthy diet discussed with patient. -Continue to follow vital signs pending fluctuation initiate treatment with antihypertensive agents.   6-class II obesity -Body mass index is 38.52 kg/m. -Low-calorie diet, portion control and increase physical activity discussed with patient.   7-lower extremity edema -In the setting of venous insufficiency and most likely component from patient obesity -Negative DVT on lower extremity Dopplers examination -Continue to keep legs elevated and continue to follow low-sodium  diet.  Consultants: None Procedures performed: See below for x-ray reports. Disposition: Home Diet recommendation: Heart healthy/low calorie diet.  DISCHARGE MEDICATION: Allergies as  of 11/13/2022       Reactions   Pantoprazole    Stomach pain   Penicillins Hives   Did it involve swelling of the face/tongue/throat, SOB, or low BP? N Did it involve sudden or severe rash/hives, skin peeling, or any reaction on the inside of your mouth or nose? Y Did you need to seek medical attention at a hospital or doctor's office? N When did it last happen?  Childhood     If all above answers are "NO", may proceed with cephalosporin use.   Oxycodone Hcl Rash        Medication List     STOP taking these medications    azithromycin 250 MG tablet Commonly known as: ZITHROMAX   ibuprofen 800 MG tablet Commonly known as: ADVIL   meloxicam 7.5 MG tablet Commonly known as: MOBIC   naproxen 250 MG tablet Commonly known as: NAPROSYN   promethazine 25 MG tablet Commonly known as: PHENERGAN   promethazine-dextromethorphan 6.25-15 MG/5ML syrup Commonly known as: PROMETHAZINE-DM       TAKE these medications    ascorbic acid 500 MG tablet Commonly known as: VITAMIN C Take 500 mg by mouth daily.   benzonatate 100 MG capsule Commonly known as: TESSALON Take 100 mg by mouth 3 (three) times daily as needed.   cholecalciferol 25 MCG (1000 UNIT) tablet Commonly known as: VITAMIN D3 Take 1,000 Units by mouth daily.   doxycycline 100 MG tablet Commonly known as: VIBRA-TABS Take 1 tablet (100 mg total) by mouth 2 (two) times daily for 5 days. 7 day supply   famotidine 20 MG tablet Commonly known as: PEPCID Take 20 mg by mouth daily.   fluticasone 50 MCG/ACT nasal spray Commonly known as: FLONASE Place 2 sprays into both nostrils daily.   gabapentin 100 MG capsule Commonly known as: NEURONTIN Take 1 capsule (100 mg total) by mouth at bedtime as needed (pain). What changed: how  much to take   Gerhardt's butt cream Crea Apply 1 Application topically 4 (four) times daily as needed for irritation.   hydrOXYzine 25 MG tablet Commonly known as: ATARAX Take 25 mg by mouth as needed for itching.   Ipratropium-Albuterol 20-100 MCG/ACT Aers respimat Commonly known as: COMBIVENT Inhale 1 puff into the lungs every 6 (six) hours as needed for wheezing or shortness of breath.   loratadine 10 MG tablet Commonly known as: CLARITIN Take 1 tablet (10 mg total) by mouth daily. Start taking on: November 14, 2022   montelukast 10 MG tablet Commonly known as: SINGULAIR Take 10 mg by mouth at bedtime.   predniSONE 20 MG tablet Commonly known as: DELTASONE Take 2 Tablets by mouth daily x 2 days; then 1 tablet by mouth daily x 2 days; then half tablet by mouth daily x 3 days and stop prednisone. What changed:  medication strength how much to take how to take this when to take this additional instructions   psyllium 95 % Pack Commonly known as: HYDROCIL/METAMUCIL Take 1 packet by mouth daily. What changed:  when to take this reasons to take this        Follow-up Information     Richardean Chimera, MD. Schedule an appointment as soon as possible for a visit in 10 day(s).   Specialty: Family Medicine Contact information: [address] [place] [address] [phone]                Discharge Exam: Ceasar Mons Weights   11/10/22 1210  Weight: 80.7  kg   General exam: Alert, awake, oriented x 3; patient reports feeling much better and was able to speak in full sentences.  Still with intermittent coughing spells but ready to go home. Respiratory system: Positive scattered rhonchi; very little expiratory wheezing appreciated on examination.  No using accessory muscle.  Good saturation on room air. Cardiovascular system:RRR. No rubs or gallops; no JVD. Gastrointestinal system: Abdomen is obese, nondistended, soft and nontender. No organomegaly or masses felt. Normal  bowel sounds heard. Central nervous system: Alert and oriented. No focal neurological deficits. Extremities: No cyanosis or clubbing; trace edema bilaterally. Skin: No petechiae. Psychiatry: Judgement and insight appear normal. Mood & affect appropriate.    Condition at discharge: Stable and improved.  The results of significant diagnostics from this hospitalization (including imaging, microbiology, ancillary and laboratory) are listed below for reference.   Imaging Studies: US Venous Img Lower Bilateral (DVT)  Result Date: 11/10/2022 CLINICAL DATA:  Pain and swelling. EXAM: Bilateral LOWER EXTREMITY VENOUS DOPPLER ULTRASOUND TECHNIQUE: Gray-scale sonography with compression, as well as color and duplex ultrasound, were performed to evaluate the deep venous system(s) from the level of the common femoral vein through the popliteal and proximal calf veins. COMPARISON:  None Available. FINDINGS: VENOUS Normal compressibility of the common femoral, superficial femoral, and popliteal veins, as well as the visualized calf veins. Visualized portions of profunda femoral vein and great saphenous vein unremarkable. No filling defects to suggest DVT on grayscale or color Doppler imaging. Doppler waveforms show normal direction of venous flow, normal respiratory plasticity and response to augmentation. Limited views of the contralateral common femoral vein are unremarkable. OTHER Soft tissue edema noted in the calf regions. Limitations: none IMPRESSION: No evidence of bilateral lower extremity DVT Electronically Signed   By: Karen Kays M.D.   On: 11/10/2022 18:00   DG Chest 2 View  Result Date: 11/10/2022 CLINICAL DATA:  Shortness of breath EXAM: CHEST - 2 VIEW COMPARISON:  02/07/2022 FINDINGS: Mild cardiomegaly, which may be accentuated by low lung volumes. No focal pulmonary opacity. No pleural effusion or pneumothorax. No acute osseous abnormality. IMPRESSION: Mild cardiomegaly, which may be accentuated  by low lung volumes. Electronically Signed   By: Wiliam Ke M.D.   On: 11/10/2022 12:42    Microbiology: Results for orders placed or performed during the hospital encounter of 11/10/22  SARS Coronavirus 2 by RT PCR (hospital order, performed in [facility] hospital lab) *cepheid single result test* Anterior Nasal Swab     Status: None   Collection Time: 11/10/22 12:14 PM   Specimen: Anterior Nasal Swab  Result Value Ref Range Status   SARS Coronavirus 2 by RT PCR NEGATIVE NEGATIVE Final    Comment: (NOTE) SARS-CoV-2 target nucleic acids are NOT DETECTED.  The SARS-CoV-2 RNA is generally detectable in upper and lower respiratory specimens during the acute phase of infection. The lowest concentration of SARS-CoV-2 viral copies this assay can detect is 250 copies / mL. A negative result does not preclude SARS-CoV-2 infection and should not be used as the sole basis for treatment or other patient management decisions.  A negative result may occur with improper specimen collection / handling, submission of specimen other than nasopharyngeal swab, presence of viral mutation(s) within the areas targeted by this assay, and inadequate number of viral copies (<250 copies / mL). A negative result must be combined with clinical observations, patient history, and epidemiological information.  Fact Sheet for Patients:   RoadLapTop.co.za  Fact Sheet for Healthcare Providers: http://kim-miller.com/  This test is not yet approved or  cleared by the [place] and has been authorized for detection and/or diagnosis of SARS-CoV-2 by FDA under an Emergency Use Authorization (EUA).  This EUA will remain in effect (meaning this test can be used) for the duration of the COVID-19 declaration under Section 564(b)(1) of the Act, 21 U.S.C. section 360bbb-3(b)(1), unless the authorization is terminated or revoked sooner.  Performed at [facility], [address] [place], [place] [address]   Respiratory (~20 pathogens) panel by PCR     Status: Abnormal   Collection Time: 11/10/22 12:14 PM   Specimen: Nasopharyngeal Swab; Respiratory  Result Value Ref Range Status   Adenovirus NOT DETECTED NOT DETECTED Final   Coronavirus 229E NOT DETECTED NOT DETECTED Final    Comment: (NOTE) The Coronavirus on the Respiratory Panel, DOES NOT test for the novel  Coronavirus (2019 nCoV)    Coronavirus HKU1 NOT DETECTED NOT DETECTED Final   Coronavirus NL63 NOT DETECTED NOT DETECTED Final   Coronavirus OC43 NOT DETECTED NOT DETECTED Final   Metapneumovirus NOT DETECTED NOT DETECTED Final   Rhinovirus / Enterovirus NOT DETECTED NOT DETECTED Final   Influenza A NOT DETECTED NOT DETECTED Final   Influenza B NOT DETECTED NOT DETECTED Final   Parainfluenza Virus 1 NOT DETECTED NOT DETECTED Final   Parainfluenza Virus 2 NOT DETECTED NOT DETECTED Final   Parainfluenza Virus 3 DETECTED (A) NOT DETECTED Final   Parainfluenza Virus 4 NOT DETECTED NOT DETECTED Final   Respiratory Syncytial Virus NOT DETECTED NOT DETECTED Final   Bordetella pertussis NOT DETECTED NOT DETECTED Final   Bordetella Parapertussis NOT DETECTED NOT DETECTED Final   Chlamydophila pneumoniae NOT DETECTED NOT DETECTED Final   Mycoplasma pneumoniae NOT DETECTED NOT DETECTED Final    Comment: Performed at [facility], [address], [place], [place] [address]    Labs: CBC: Recent Labs  Lab 11/10/22 1308 11/11/22 0619  WBC 3.0* 3.4*  NEUTROABS 1.3*  --   HGB 11.3* 9.2*  HCT 36.1 28.7*  MCV 88.3 86.4  PLT 157 145*   Basic Metabolic Panel: Recent Labs  Lab 11/10/22 1308 11/11/22 0619  NA 124* 132*  K 3.9 3.8  CL 90* 102  CO2 21* 23  GLUCOSE 95 103*  BUN 9 9  CREATININE 0.64 0.48  CALCIUM 8.6* 8.5*   Liver Function Tests: Recent Labs  Lab 11/10/22 1308 11/11/22 0619  AST 286* 186*  ALT 84* 68*  ALKPHOS 165* 146*  BILITOT 0.8 0.7  PROT 7.7 6.2*   ALBUMIN 4.0 3.1*   CBG: No results for input(s): "GLUCAP" in the last 168 hours.  Discharge time spent: greater than 30 minutes.  Signed: Vassie Loll, MD [facility] 11/13/2022

## 2022-11-15 DIAGNOSIS — E871 Hypo-osmolality and hyponatremia: Secondary | ICD-10-CM | POA: Diagnosis not present

## 2022-11-15 DIAGNOSIS — B348 Other viral infections of unspecified site: Secondary | ICD-10-CM | POA: Diagnosis not present

## 2022-11-15 DIAGNOSIS — D509 Iron deficiency anemia, unspecified: Secondary | ICD-10-CM | POA: Diagnosis not present

## 2022-11-15 DIAGNOSIS — R7401 Elevation of levels of liver transaminase levels: Secondary | ICD-10-CM | POA: Diagnosis not present

## 2022-11-16 ENCOUNTER — Ambulatory Visit: Payer: Medicaid Other | Admitting: Gastroenterology

## 2022-11-21 ENCOUNTER — Other Ambulatory Visit: Payer: Self-pay

## 2022-11-21 DIAGNOSIS — R1084 Generalized abdominal pain: Secondary | ICD-10-CM | POA: Diagnosis not present

## 2022-11-21 DIAGNOSIS — I10 Essential (primary) hypertension: Secondary | ICD-10-CM | POA: Insufficient documentation

## 2022-11-21 DIAGNOSIS — Z79899 Other long term (current) drug therapy: Secondary | ICD-10-CM | POA: Diagnosis not present

## 2022-11-21 DIAGNOSIS — N3000 Acute cystitis without hematuria: Secondary | ICD-10-CM | POA: Diagnosis not present

## 2022-11-21 DIAGNOSIS — N390 Urinary tract infection, site not specified: Secondary | ICD-10-CM | POA: Diagnosis not present

## 2022-11-21 DIAGNOSIS — R300 Dysuria: Secondary | ICD-10-CM | POA: Diagnosis not present

## 2022-11-21 NOTE — ED Triage Notes (Signed)
Pt states she was seen at her PCP and was dx with a UTI and yeast infection, was prescribed Fluconazole and Nitrofurantoin but was unable to get medications due to insurance issue.

## 2022-11-22 ENCOUNTER — Encounter (HOSPITAL_COMMUNITY): Payer: Self-pay

## 2022-11-22 ENCOUNTER — Other Ambulatory Visit: Payer: Self-pay

## 2022-11-22 ENCOUNTER — Emergency Department (HOSPITAL_COMMUNITY)
Admission: EM | Admit: 2022-11-22 | Discharge: 2022-11-22 | Disposition: A | Discharge status: Home / Self Care | Payer: 59 | Attending: Emergency Medicine | Admitting: Emergency Medicine

## 2022-11-22 ENCOUNTER — Emergency Department (HOSPITAL_COMMUNITY)
Admission: EM | Admit: 2022-11-22 | Discharge: 2022-11-23 | Disposition: A | Discharge status: Home / Self Care | Payer: 59 | Source: Home / Self Care | Attending: Emergency Medicine | Admitting: Emergency Medicine

## 2022-11-22 DIAGNOSIS — R1084 Generalized abdominal pain: Secondary | ICD-10-CM | POA: Insufficient documentation

## 2022-11-22 DIAGNOSIS — N390 Urinary tract infection, site not specified: Secondary | ICD-10-CM | POA: Diagnosis not present

## 2022-11-22 DIAGNOSIS — N3000 Acute cystitis without hematuria: Secondary | ICD-10-CM

## 2022-11-22 LAB — URINALYSIS, W/ REFLEX TO CULTURE (INFECTION SUSPECTED)
Bilirubin Urine: NEGATIVE
Glucose, UA: NEGATIVE mg/dL
Ketones, ur: 20 mg/dL — AB
Nitrite: NEGATIVE
Protein, ur: NEGATIVE mg/dL
Specific Gravity, Urine: 1.005 (ref 1.005–1.030)
pH: 5 (ref 5.0–8.0)

## 2022-11-22 MED ORDER — CEPHALEXIN 500 MG PO CAPS: 500.0000 mg | ORAL_CAPSULE | Freq: Three times a day (TID) | ORAL | 0 refills | Status: DC

## 2022-11-22 MED ORDER — PHENAZOPYRIDINE HCL 200 MG PO TABS: 200.0000 mg | ORAL_TABLET | Freq: Three times a day (TID) | ORAL | 0 refills | Status: DC

## 2022-11-22 MED ORDER — CEPHALEXIN 500 MG PO CAPS
500.0000 mg | ORAL_CAPSULE | Freq: Once | ORAL | Status: AC
  Administered 2022-11-22: 500 mg via ORAL
  Filled 2022-11-22: qty 1

## 2022-11-22 MED ORDER — PHENAZOPYRIDINE HCL 100 MG PO TABS
200.0000 mg | ORAL_TABLET | Freq: Once | ORAL | Status: AC
  Administered 2022-11-22: 200 mg via ORAL
  Filled 2022-11-22: qty 2

## 2022-11-22 NOTE — ED Triage Notes (Signed)
Pt to Ed from home, was seen here in ED earlier today, diagnosed with UTI, given anbx prescription and pyridium , pt says she has taken one of each this morning, has not taken anything for pain or any other doses of prescribed medication  Pt says she is here to get something to relieve her pain and make her feel better.

## 2022-11-22 NOTE — ED Provider Notes (Signed)
[place] EMERGENCY DEPARTMENT AT [facility] Provider Note   CSN: [id number] Arrival date & time: 11/21/22  2347     History  Chief Complaint  Patient presents with   Dysuria    LENAYAH LANTZER is a 52 y.o. female.  Patient is a 52 year old female with past medical history of hypertension, peptic ulcer disease, renal insufficiency.  Patient presenting today with complaints of burning with urination.  This has been worsening over the past several days.  She was seen by her primary doctor earlier today and was prescribed Macrobid.  She went to get this prescription filled, however for some reason there were some Medicaid issues that prevented this from happening.  Patient presents here with ongoing burning with urination.  No fevers or chills.  No abdominal or back pain.  Pain worse with urinating.  No alleviating factors.  The history is provided by the patient.       Home Medications Prior to Admission medications   Medication Sig Start Date End Date Taking? Authorizing Provider  benzonatate (TESSALON) 100 MG capsule Take 100 mg by mouth 3 (three) times daily as needed. 11/06/22   [provider]  cholecalciferol (VITAMIN D3) 25 MCG (1000 UNIT) tablet Take 1,000 Units by mouth daily.    [provider]  famotidine (PEPCID) 20 MG tablet Take 20 mg by mouth daily. 11/11/21   [provider]  fluticasone (FLONASE) 50 MCG/ACT nasal spray Place 2 sprays into both nostrils daily. 09/15/22   [provider]  gabapentin (NEURONTIN) 100 MG capsule Take 1 capsule (100 mg total) by mouth at bedtime as needed (pain). 11/13/22   Vassie Loll, MD  hydrOXYzine (ATARAX) 25 MG tablet Take 25 mg by mouth as needed for itching. 09/28/22   [provider]  Ipratropium-Albuterol (COMBIVENT) 20-100 MCG/ACT AERS respimat Inhale 1 puff into the lungs every 6 (six) hours as needed for wheezing or shortness of breath. 11/13/22   Vassie Loll, MD   loratadine (CLARITIN) 10 MG tablet Take 1 tablet (10 mg total) by mouth daily. 11/14/22   Vassie Loll, MD  montelukast (SINGULAIR) 10 MG tablet Take 10 mg by mouth at bedtime. 09/19/22   [provider]  Nystatin (GERHARDT'S BUTT CREAM) CREA Apply 1 Application topically 4 (four) times daily as needed for irritation. 11/13/22   Vassie Loll, MD  predniSONE (DELTASONE) 20 MG tablet Take 2 Tablets by mouth daily x 2 days; then 1 tablet by mouth daily x 2 days; then half tablet by mouth daily x 3 days and stop prednisone. 11/13/22   Vassie Loll, MD  psyllium (HYDROCIL/METAMUCIL) 95 % PACK Take 1 packet by mouth daily. Patient taking differently: Take 1 packet by mouth daily as needed for mild constipation. 08/28/21   Catarina Hartshorn, MD  vitamin C (ASCORBIC ACID) 500 MG tablet Take 500 mg by mouth daily.    [provider]      Allergies    Pantoprazole, Penicillins, and Oxycodone hcl    Review of Systems   Review of Systems  All other systems reviewed and are negative.   Physical Exam Updated Vital Signs BP 132/89   Pulse (!) 106   Temp 98.6 F (37 C) (Oral)   Resp 18   Ht 4\' 9"  (1.448 m)   Wt 80.7 kg   SpO2 96%   BMI 38.52 kg/m  Physical Exam Vitals and nursing note reviewed.  Constitutional:      General: She is not in acute distress.  Appearance: She is well-developed. She is not diaphoretic.  HENT:     Head: Normocephalic and atraumatic.  Cardiovascular:     Rate and Rhythm: Normal rate and regular rhythm.     Heart sounds: No murmur heard.    No friction rub. No gallop.  Pulmonary:     Effort: Pulmonary effort is normal. No respiratory distress.     Breath sounds: Normal breath sounds. No wheezing.  Abdominal:     General: Bowel sounds are normal. There is no distension.     Palpations: Abdomen is soft.     Tenderness: There is no abdominal tenderness.  Musculoskeletal:        General: Normal range of motion.     Cervical back: Normal range of  motion and neck supple.  Skin:    General: Skin is warm and dry.  Neurological:     General: No focal deficit present.     Mental Status: She is alert and oriented to person, place, and time.     ED Results / Procedures / Treatments   Labs (all labs ordered are listed, but only abnormal results are displayed) Labs Reviewed  URINALYSIS, W/ REFLEX TO CULTURE (INFECTION SUSPECTED) - Abnormal; Notable for the following components:      Result Value   APPearance HAZY (*)    Hgb urine dipstick MODERATE (*)    Ketones, ur 20 (*)    Leukocytes,Ua LARGE (*)    Bacteria, UA MANY (*)    All other components within normal limits  URINE CULTURE    EKG None  Radiology No results found.  Procedures Procedures    Medications Ordered in ED Medications  cephALEXin (KEFLEX) capsule 500 mg (has no administration in time range)  phenazopyridine (PYRIDIUM) tablet 200 mg (has no administration in time range)    ED Course/ Medical Decision Making/ A&P  Urinary tract infection to be treated with Keflex and Pyridium.  To return as needed for any problems.  Patient is well-appearing and abdominal exam is benign.  I see no indication for further workup or imaging studies.  Final Clinical Impression(s) / ED Diagnoses Final diagnoses:  None    Rx / DC Orders ED Discharge Orders     None         Geoffery Lyons, MD 11/22/22 [phone]

## 2022-11-22 NOTE — Discharge Instructions (Signed)
Begin taking Keflex as prescribed.  Begin taking Pyridium as prescribed.  Follow-up with primary doctor if not improving in the next few days.

## 2022-11-23 ENCOUNTER — Emergency Department (HOSPITAL_COMMUNITY): Payer: 59

## 2022-11-23 DIAGNOSIS — N390 Urinary tract infection, site not specified: Secondary | ICD-10-CM | POA: Diagnosis not present

## 2022-11-23 LAB — URINALYSIS, ROUTINE W REFLEX MICROSCOPIC
Bilirubin Urine: NEGATIVE
Glucose, UA: NEGATIVE mg/dL
Ketones, ur: 5 mg/dL — AB
Nitrite: POSITIVE — AB
Protein, ur: NEGATIVE mg/dL
Specific Gravity, Urine: 1.002 — ABNORMAL LOW (ref 1.005–1.030)
pH: 6 (ref 5.0–8.0)

## 2022-11-23 LAB — COMPREHENSIVE METABOLIC PANEL
ALT: 54 U/L — ABNORMAL HIGH (ref 0–44)
AST: 126 U/L — ABNORMAL HIGH (ref 15–41)
Albumin: 3.9 g/dL (ref 3.5–5.0)
Alkaline Phosphatase: 194 U/L — ABNORMAL HIGH (ref 38–126)
Anion gap: 19 — ABNORMAL HIGH (ref 5–15)
BUN: <5 mg/dL — ABNORMAL LOW (ref 6–20)
CO2: 16 mmol/L — ABNORMAL LOW (ref 22–32)
Calcium: 8.9 mg/dL (ref 8.9–10.3)
Chloride: 91 mmol/L — ABNORMAL LOW (ref 98–111)
Creatinine, Ser: 0.55 mg/dL (ref 0.44–1.00)
GFR, Estimated: >60 mL/min (ref >60)
Glucose, Bld: 71 mg/dL (ref 70–99)
Potassium: 3.7 mmol/L (ref 3.5–5.1)
Sodium: 126 mmol/L — ABNORMAL LOW (ref 135–145)
Total Bilirubin: 1.3 mg/dL — ABNORMAL HIGH (ref 0.3–1.2)
Total Protein: 7.9 g/dL (ref 6.5–8.1)

## 2022-11-23 LAB — BASIC METABOLIC PANEL
Anion gap: 13 (ref 5–15)
BUN: <5 mg/dL — ABNORMAL LOW (ref 6–20)
CO2: 18 mmol/L — ABNORMAL LOW (ref 22–32)
Calcium: 8.1 mg/dL — ABNORMAL LOW (ref 8.9–10.3)
Chloride: 100 mmol/L (ref 98–111)
Creatinine, Ser: 0.46 mg/dL (ref 0.44–1.00)
GFR, Estimated: >60 mL/min (ref >60)
Glucose, Bld: 69 mg/dL — ABNORMAL LOW (ref 70–99)
Potassium: 3.9 mmol/L (ref 3.5–5.1)
Sodium: 131 mmol/L — ABNORMAL LOW (ref 135–145)

## 2022-11-23 LAB — CBC WITH DIFFERENTIAL/PLATELET
Abs Immature Granulocytes: 0.02 10*3/uL (ref 0.00–0.07)
Basophils Absolute: 0 10*3/uL (ref 0.0–0.1)
Basophils Relative: 1 %
Eosinophils Absolute: 0.2 10*3/uL (ref 0.0–0.5)
Eosinophils Relative: 3 %
HCT: 32.3 % — ABNORMAL LOW (ref 36.0–46.0)
Hemoglobin: 10.2 g/dL — ABNORMAL LOW (ref 12.0–15.0)
Immature Granulocytes: 0 %
Lymphocytes Relative: 19 %
Lymphs Abs: 1.2 10*3/uL (ref 0.7–4.0)
MCH: 27.1 pg (ref 26.0–34.0)
MCHC: 31.6 g/dL (ref 30.0–36.0)
MCV: 85.9 fL (ref 80.0–100.0)
Monocytes Absolute: 0.4 10*3/uL (ref 0.1–1.0)
Monocytes Relative: 6 %
Neutro Abs: 4.4 10*3/uL (ref 1.7–7.7)
Neutrophils Relative %: 71 %
Platelets: 224 10*3/uL (ref 150–400)
RBC: 3.76 MIL/uL — ABNORMAL LOW (ref 3.87–5.11)
RDW: 18.3 % — ABNORMAL HIGH (ref 11.5–15.5)
WBC: 6.2 10*3/uL (ref 4.0–10.5)
nRBC: 0 % (ref 0.0–0.2)

## 2022-11-23 LAB — URINE CULTURE

## 2022-11-23 LAB — LACTIC ACID, PLASMA: Lactic Acid, Venous: 2.4 mmol/L (ref 0.5–1.9)

## 2022-11-23 LAB — PREGNANCY, URINE: Preg Test, Ur: NEGATIVE

## 2022-11-23 LAB — LIPASE, BLOOD: Lipase: 24 U/L (ref 11–51)

## 2022-11-23 MED ORDER — ONDANSETRON HCL 4 MG/2ML IJ SOLN
4.0000 mg | Freq: Once | INTRAMUSCULAR | Status: AC
  Administered 2022-11-23: 4 mg via INTRAVENOUS
  Filled 2022-11-23: qty 2

## 2022-11-23 MED ORDER — MORPHINE SULFATE (PF) 4 MG/ML IV SOLN
4.0000 mg | Freq: Once | INTRAVENOUS | Status: AC
  Administered 2022-11-23: 4 mg via INTRAVENOUS
  Filled 2022-11-23: qty 1

## 2022-11-23 MED ORDER — IOHEXOL 300 MG/ML  SOLN
100.0000 mL | Freq: Once | INTRAMUSCULAR | Status: AC | PRN
  Administered 2022-11-23: 100 mL via INTRAVENOUS

## 2022-11-23 MED ORDER — SODIUM CHLORIDE 0.9 % IV BOLUS
1000.0000 mL | Freq: Once | INTRAVENOUS | Status: AC
  Administered 2022-11-23: 1000 mL via INTRAVENOUS

## 2022-11-23 NOTE — ED Notes (Signed)
Up to BR several times steady gait

## 2022-11-23 NOTE — ED Notes (Signed)
Dr Blinda Leatherwood notified of lactic 2.4

## 2022-11-23 NOTE — ED Provider Notes (Signed)
[place] EMERGENCY DEPARTMENT AT [facility] Provider Note   CSN: [id number] Arrival date & time: 11/22/22  2141     History  Chief Complaint  Patient presents with   Pain    Alyssa Carey is a 52 y.o. female.  Patient presents to the emergency department for evaluation of abdominal pain.  Patient reports that she was seen in the ED yesterday and diagnosed with urinary tract infection.  At that time she was mainly having burning with urination.  Patient now reports that she has diffuse pain that is continuous, not only when she urinates.  She has been seeing blood in her urine and also passing blood in her stools.  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  She reports that since the colostomy was reversed she has had problems with diarrhea and has been experiencing loose stools.       Home Medications Prior to Admission medications   Medication Sig Start Date End Date Taking? Authorizing Provider  benzonatate (TESSALON) 100 MG capsule Take 100 mg by mouth 3 (three) times daily as needed. 11/06/22   [provider]  cephALEXin (KEFLEX) 500 MG capsule Take 1 capsule (500 mg total) by mouth 3 (three) times daily. 11/22/22   Geoffery Lyons, MD  cholecalciferol (VITAMIN D3) 25 MCG (1000 UNIT) tablet Take 1,000 Units by mouth daily.    [provider]  famotidine (PEPCID) 20 MG tablet Take 20 mg by mouth daily. 11/11/21   [provider]  fluticasone (FLONASE) 50 MCG/ACT nasal spray Place 2 sprays into both nostrils daily. 09/15/22   [provider]  gabapentin (NEURONTIN) 100 MG capsule Take 1 capsule (100 mg total) by mouth at bedtime as needed (pain). 11/13/22   Vassie Loll, MD  hydrOXYzine (ATARAX) 25 MG tablet Take 25 mg by mouth as needed for itching. 09/28/22   [provider]  Ipratropium-Albuterol (COMBIVENT) 20-100 MCG/ACT AERS respimat Inhale 1 puff into the lungs every 6  (six) hours as needed for wheezing or shortness of breath. 11/13/22   Vassie Loll, MD  loratadine (CLARITIN) 10 MG tablet Take 1 tablet (10 mg total) by mouth daily. 11/14/22   Vassie Loll, MD  montelukast (SINGULAIR) 10 MG tablet Take 10 mg by mouth at bedtime. 09/19/22   [provider]  Nystatin (GERHARDT'S BUTT CREAM) CREA Apply 1 Application topically 4 (four) times daily as needed for irritation. 11/13/22   Vassie Loll, MD  phenazopyridine (PYRIDIUM) 200 MG tablet Take 1 tablet (200 mg total) by mouth 3 (three) times daily. 11/22/22   Geoffery Lyons, MD  predniSONE (DELTASONE) 20 MG tablet Take 2 Tablets by mouth daily x 2 days; then 1 tablet by mouth daily x 2 days; then half tablet by mouth daily x 3 days and stop prednisone. 11/13/22   Vassie Loll, MD  psyllium (HYDROCIL/METAMUCIL) 95 % PACK Take 1 packet by mouth daily. Patient taking differently: Take 1 packet by mouth daily as needed for mild constipation. 08/28/21   Catarina Hartshorn, MD  vitamin C (ASCORBIC ACID) 500 MG tablet Take 500 mg by mouth daily.    [provider]      Allergies    Pantoprazole, Penicillins, and Oxycodone hcl    Review of Systems   Review of Systems  Physical Exam Updated Vital Signs BP 130/85   Pulse 81   Temp 98.2 F (36.8 C) (Oral)   Resp 18   Ht  4\' 9"  (1.448 m)   Wt 80.7 kg   SpO2 99%   BMI 38.50 kg/m  Physical Exam Vitals and nursing note reviewed.  Constitutional:      General: She is not in acute distress.    Appearance: She is well-developed.  HENT:     Head: Normocephalic and atraumatic.     Mouth/Throat:     Mouth: Mucous membranes are moist.  Eyes:     General: Vision grossly intact. Gaze aligned appropriately.     Extraocular Movements: Extraocular movements intact.     Conjunctiva/sclera: Conjunctivae normal.  Cardiovascular:     Rate and Rhythm: Normal rate and regular rhythm.     Pulses: Normal pulses.     Heart sounds: Normal heart sounds, S1 normal and  S2 normal. No murmur heard.    No friction rub. No gallop.  Pulmonary:     Effort: Pulmonary effort is normal. No respiratory distress.     Breath sounds: Normal breath sounds.  Abdominal:     General: Bowel sounds are normal.     Palpations: Abdomen is soft.     Tenderness: There is no abdominal tenderness. There is no guarding or rebound.     Hernia: No hernia is present.  Musculoskeletal:        General: No swelling.     Cervical back: Full passive range of motion without pain, normal range of motion and neck supple. No spinous process tenderness or muscular tenderness. Normal range of motion.     Right lower leg: No edema.     Left lower leg: No edema.  Skin:    General: Skin is warm and dry.     Capillary Refill: Capillary refill takes less than 2 seconds.     Findings: No ecchymosis, erythema, rash or wound.  Neurological:     General: No focal deficit present.     Mental Status: She is alert and oriented to person, place, and time.     GCS: GCS eye subscore is 4. GCS verbal subscore is 5. GCS motor subscore is 6.     Cranial Nerves: Cranial nerves 2-12 are intact.     Sensory: Sensation is intact.     Motor: Motor function is intact.     Coordination: Coordination is intact.  Psychiatric:        Attention and Perception: Attention normal.        Mood and Affect: Mood normal.        Speech: Speech normal.        Behavior: Behavior normal.     ED Results / Procedures / Treatments   Labs (all labs ordered are listed, but only abnormal results are displayed) Labs Reviewed  LACTIC ACID, PLASMA - Abnormal; Notable for the following components:      Result Value   Lactic Acid, Venous 2.4 (*)    All other components within normal limits  COMPREHENSIVE METABOLIC PANEL - Abnormal; Notable for the following components:   Sodium 126 (*)    Chloride 91 (*)    CO2 16 (*)    BUN <5 (*)    AST 126 (*)    ALT 54 (*)    Alkaline Phosphatase 194 (*)    Total Bilirubin 1.3 (*)     Anion gap 19 (*)    All other components within normal limits  URINALYSIS, ROUTINE W REFLEX MICROSCOPIC - Abnormal; Notable for the following components:   Color, Urine AMBER (*)    Specific Gravity,  Urine 1.002 (*)    Hgb urine dipstick MODERATE (*)    Ketones, ur 5 (*)    Nitrite POSITIVE (*)    Leukocytes,Ua MODERATE (*)    Bacteria, UA FEW (*)    All other components within normal limits  CBC WITH DIFFERENTIAL/PLATELET - Abnormal; Notable for the following components:   RBC 3.76 (*)    Hemoglobin 10.2 (*)    HCT 32.3 (*)    RDW 18.3 (*)    All other components within normal limits  BASIC METABOLIC PANEL - Abnormal; Notable for the following components:   Sodium 131 (*)    CO2 18 (*)    Glucose, Bld 69 (*)    BUN <5 (*)    Calcium 8.1 (*)    All other components within normal limits  LIPASE, BLOOD  PREGNANCY, URINE  CBC WITH DIFFERENTIAL/PLATELET    EKG None  Radiology CT ABDOMEN PELVIS W CONTRAST  Result Date: 11/23/2022 CLINICAL DATA:  Abdominal pain.  UTI. EXAM: CT ABDOMEN AND PELVIS WITH CONTRAST TECHNIQUE: Multidetector CT imaging of the abdomen and pelvis was performed using the standard protocol following bolus administration of intravenous contrast. RADIATION DOSE REDUCTION: This exam was performed according to the departmental dose-optimization program which includes automated exposure control, adjustment of the mA and/or kV according to patient size and/or use of iterative reconstruction technique. CONTRAST:  OMNIPAQUE IOHEXOL 300 MG/ML  SOLN COMPARISON:  CT dated 01/11/2022. FINDINGS: Lower chest: The visualized lung bases are clear. No intra-abdominal free air or free fluid. Hepatobiliary: Fatty liver. There is irregularity of the liver contour in keeping with cirrhosis. No biliary dilatation. Small stone in the gallbladder neck. No pericholecystic fluid or evidence of acute cholecystitis by CT. Pancreas: Unremarkable. No pancreatic ductal dilatation or  surrounding inflammatory changes. Spleen: Normal in size without focal abnormality. Adrenals/Urinary Tract: The adrenal glands are unremarkable. There is a small left renal inferior pole cyst. There is no hydronephrosis on either side. The visualized ureters and urinary bladder appear unremarkable. Stomach/Bowel: Postsurgical changes of the bowel with anastomotic suture in the pelvis. There has been interval repair of the previously seen large left lower quadrant parastomal hernia. There is protrusion of a segment of small bowel into the anterior peritoneal wall in the left lower quadrant consistent with adhesion. There is no bowel obstruction. The appendix is normal. Vascular/Lymphatic: The abdominal aorta and IVC are unremarkable. No portal gas. There is no adenopathy. Reproductive: The uterus is anteverted.  No adnexal masses. Other: Small fat containing umbilical hernia. Musculoskeletal: No acute osseous pathology. IMPRESSION: 1. No acute intra-abdominal or pelvic pathology. 2. 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 There is adhesion of a segment of small bowel to the left lower quadrant peritoneum. No bowel obstruction. Normal appendix. 3. Cirrhosis. 4. Cholelithiasis. Electronically Signed   By: Elgie Collard M.D.   On: 11/23/2022 01:45    Procedures Procedures    Medications Ordered in ED Medications  morphine (PF) 4 MG/ML injection 4 mg (4 mg Intravenous Given 11/23/22 0046)  ondansetron (ZOFRAN) injection 4 mg (4 mg Intravenous Given 11/23/22 0048)  sodium chloride 0.9 % bolus 1,000 mL (0 mLs Intravenous Stopped 11/23/22 0330)  iohexol (OMNIPAQUE) 300 MG/ML solution 100 mL (100 mLs Intravenous Contrast Given 11/23/22 0126)  morphine (PF) 4 MG/ML injection 4 mg (4 mg Intravenous Given 11/23/22 1610)    ED Course/ Medical Decision Making/ A&P  Medical Decision Making Amount and/or Complexity of Data Reviewed External Data  Reviewed: labs and notes. Labs: ordered. Decision-making details documented in ED Course. Radiology: ordered and independent interpretation performed. Decision-making details documented in ED Course.  Risk Prescription drug management.   Presents to the emergency department for evaluation of abdominal pain.  Differential Diagnosis considered includes, but not limited to: Appendicitis; colitis; diverticulitis; bowel obstruction; cystitis; nephrolithiasis; pyelonephritis; ovarian cyst, ovarian torsion, PID, ectopic pregnancy.   Kommor exam is benign.  She indicates right lower abdomen as the area of pain currently.  She does not have any significant tenderness at this area currently.  Patient was seen in the ED 24 hours ago and diagnosed with cystitis.  She is on antibiotics.  Patient now complaining of a more diffuse pain.  Formal workup was performed.  This included lab work and CT scan.  Patient with minor electrolyte abnormalities including slight anion gap acidosis.  Patient does have history of cirrhosis and mild elevation of lactic acid.  Lactic acid elevation likely secondary to liver disease, not active infection.  She does not have a fever.  She has no leukocytosis.  No other SIRS criteria.  Hydrated and be met rechecked.  Sodium is improved, anion gap is now normal.  CT scan did not show acute abnormality.  She does have cholelithiasis without cholecystitis.  No Murphy sign on examination.  LFTs are mildly elevated but this is consistent with prior studies and actually elevation is less than her baseline.  She will follow-up with her GI doctor, follow-up as scheduled in 2 weeks.  Continue the antibiotics for UTI, given return precautions.        Final Clinical Impression(s) / ED Diagnoses Final diagnoses:  Generalized abdominal pain    Rx / DC Orders ED Discharge Orders     None         Flynt Breeze, Canary Brim, MD 11/23/22 0501

## 2022-11-23 NOTE — ED Notes (Signed)
Patient transported to CT 

## 2022-11-24 DIAGNOSIS — N390 Urinary tract infection, site not specified: Secondary | ICD-10-CM | POA: Diagnosis not present

## 2022-11-24 DIAGNOSIS — S92353A Displaced fracture of fifth metatarsal bone, unspecified foot, initial encounter for closed fracture: Secondary | ICD-10-CM | POA: Diagnosis not present

## 2022-11-24 DIAGNOSIS — R7989 Other specified abnormal findings of blood chemistry: Secondary | ICD-10-CM | POA: Diagnosis not present

## 2022-11-24 DIAGNOSIS — E871 Hypo-osmolality and hyponatremia: Secondary | ICD-10-CM | POA: Diagnosis not present

## 2022-11-24 DIAGNOSIS — Z6838 Body mass index (BMI) 38.0-38.9, adult: Secondary | ICD-10-CM | POA: Diagnosis not present

## 2022-12-01 DIAGNOSIS — S92355D Nondisplaced fracture of fifth metatarsal bone, left foot, subsequent encounter for fracture with routine healing: Secondary | ICD-10-CM | POA: Diagnosis not present

## 2022-12-05 ENCOUNTER — Other Ambulatory Visit (HOSPITAL_COMMUNITY)
Admission: RE | Admit: 2022-12-05 | Discharge: 2022-12-05 | Disposition: A | Discharge status: Home / Self Care | Payer: 59 | Source: Ambulatory Visit | Attending: Gastroenterology | Admitting: Gastroenterology

## 2022-12-05 ENCOUNTER — Other Ambulatory Visit: Payer: Self-pay | Admitting: *Deleted

## 2022-12-05 ENCOUNTER — Ambulatory Visit (INDEPENDENT_AMBULATORY_CARE_PROVIDER_SITE_OTHER): Payer: 59 | Admitting: Gastroenterology

## 2022-12-05 ENCOUNTER — Encounter: Payer: Self-pay | Admitting: Gastroenterology

## 2022-12-05 ENCOUNTER — Telehealth: Payer: Self-pay | Admitting: *Deleted

## 2022-12-05 VITALS — BP 97/67 | HR 88 | Temp 97.9°F | Ht <= 58 in | Wt 193.2 lb

## 2022-12-05 DIAGNOSIS — K709 Alcoholic liver disease, unspecified: Secondary | ICD-10-CM | POA: Insufficient documentation

## 2022-12-05 DIAGNOSIS — K529 Noninfective gastroenteritis and colitis, unspecified: Secondary | ICD-10-CM | POA: Diagnosis not present

## 2022-12-05 DIAGNOSIS — M726 Necrotizing fasciitis: Secondary | ICD-10-CM

## 2022-12-05 LAB — COMPREHENSIVE METABOLIC PANEL
ALT: 55 U/L — ABNORMAL HIGH (ref 0–44)
AST: 145 U/L — ABNORMAL HIGH (ref 15–41)
Albumin: 3.4 g/dL — ABNORMAL LOW (ref 3.5–5.0)
Alkaline Phosphatase: 219 U/L — ABNORMAL HIGH (ref 38–126)
Anion gap: 14 (ref 5–15)
BUN: <5 mg/dL — ABNORMAL LOW (ref 6–20)
CO2: 16 mmol/L — ABNORMAL LOW (ref 22–32)
Calcium: 8.3 mg/dL — ABNORMAL LOW (ref 8.9–10.3)
Chloride: 94 mmol/L — ABNORMAL LOW (ref 98–111)
Creatinine, Ser: 0.57 mg/dL (ref 0.44–1.00)
GFR, Estimated: >60 mL/min (ref >60)
Glucose, Bld: 67 mg/dL — ABNORMAL LOW (ref 70–99)
Potassium: 4.2 mmol/L (ref 3.5–5.1)
Sodium: 124 mmol/L — ABNORMAL LOW (ref 135–145)
Total Bilirubin: 1 mg/dL (ref 0.3–1.2)
Total Protein: 7.5 g/dL (ref 6.5–8.1)

## 2022-12-05 LAB — FERRITIN: Ferritin: 52 ng/mL (ref 11–307)

## 2022-12-05 LAB — IRON AND TIBC
Iron: 153 ug/dL (ref 28–170)
Saturation Ratios: 40 % — ABNORMAL HIGH (ref 10.4–31.8)
TIBC: 383 ug/dL (ref 250–450)
UIBC: 230 ug/dL

## 2022-12-05 LAB — PROTIME-INR
INR: 1 (ref 0.8–1.2)
Prothrombin Time: 13.4 seconds (ref 11.4–15.2)

## 2022-12-05 MED ORDER — HYDROCORTISONE (PERIANAL) 2.5 % EX CREA: 1.0000 | TOPICAL_CREAM | Freq: Two times a day (BID) | CUTANEOUS | 1 refills | Status: DC

## 2022-12-05 NOTE — Addendum Note (Signed)
Addended by: Elinor Dodge on: 12/05/2022 03:09 PM   Modules accepted: Orders

## 2022-12-05 NOTE — Patient Instructions (Signed)
Please have blood work done at [facility].  I have ordered a special ultrasound of your liver.  I have sent in a medication called dicyclomine to take up to three times a day before meals. This is supposed to help with frequent stools and cramping. Possible side effects include constipation, dry mouth, dizziness, confusion. Stop if this occurs!  We are referring you to a Wound Clinic.  We will see you in 6 weeks!  It was a pleasure to see you today. I want to create trusting relationships with patients and provide genuine, compassionate, and quality care. I truly value your feedback, so please be on the lookout for a survey regarding your visit with me today. I appreciate your time in completing this!         Gelene Mink, PhD, ANP-BC [facility]

## 2022-12-05 NOTE — Telephone Encounter (Signed)
[facility]  [place] scheduled for 12/26/22, arrive at 8:15 am to check in, NPO after midnight.

## 2022-12-05 NOTE — Addendum Note (Signed)
Addended by: Elinor Dodge on: 12/05/2022 03:06 PM   Modules accepted: Orders

## 2022-12-05 NOTE — Progress Notes (Signed)
Gastroenterology Office Note     Primary Care Physician:  Richardean Chimera, MD  Primary Gastroenterologist: Dr. Marletta Lor   Chief Complaint   Chief Complaint  Patient presents with   Follow-up    Elevated LFT's, ,, abd issues     History of Present Illness   Alyssa GOLDSBOROUGH is a 52 y.o. female presenting today in follow-up with a history of  necrotizing fasciitis s/p diverting colostomy June 2021 complicated by parastomal hernia, small bowel obstruction January/February 2023, previous GI bleed due to peptic ulcer disease. July 2023 she underwent lap assisted colostomy takedown and partial colectomy by Dr. Maisie Fus. History of alcoholic hepatitis and concern for cirrhosis on scan.    CT abd/pelvis May 2024: adhesion of segment of small bowel to LLQ. Cirrhosis noted. Spleen normal. No prior mention of cirrhosis on imaging. History of hepatic steatosis. Recent platelets 224. Prior intermittently fluctuating with mild thrombocytopenia. Abnormal LFTs May 2024 with Tbili 1.3, ALk Phos 194, AST 126, ALT 54. Persistently elevated with predominance of AST compared to ALT.   Hep A antibody IgM negative in 2022, Hep B surface antigen negative, Hep C antibody negative and negative viral load in 2022. ANA, AMA negative in 2022. IgG 1774 in 2022.    Postprandial chronically loose stools. Worse after colostomy takedown. Has poor appetite. Feels like diarrhea is a little bit better. No alcohol about 6-7 months.   EGD June 2022: gastritis, normal duodenum.   Colonoscopy Aug 2022: Preparation of the colon was fair.                           - The rectum is normal.                           - No specimens collected.                           - The rectum appeared normal. Previously noted                            rectal ulcer has healed. Mucosa was quite friable                            with evidence of minor barotrauma with evaluation                            of the rectum itself. All air  was suctioned out as                            scope was withdrawn through the remaining colon. No                            active colitis. No polyps or evidence of cancer                            identified.    Past Medical History:  Diagnosis Date   Alcohol abuse    HTN (hypertension) 10/05/2020   Necrotizing fasciitis [facility])     Past Surgical History:  Procedure Laterality Date   BIOPSY  10/07/2020   Procedure: BIOPSY;  Surgeon: Corbin Ade, MD;  [facility]: [facility];  Service: Endoscopy;;   COLONOSCOPY WITH PROPOFOL N/A 10/07/2020   single healing rectal ulcer in distal rectum with surrounding mucosal friability and markedly abnormal proximal colon with ulceration s/p biopsies.   COLONOSCOPY WITH PROPOFOL  02/22/2021   Procedure: COLONOSCOPY WITH PROPOFOL;  Surgeon: Lanelle Bal, DO;  [facility]: [facility];  Service: Endoscopy;;   COLOSTOMY TAKEDOWN N/A 01/26/2022   Procedure: LAPAROSCOPIC COLOSTOMY TAKEDOWN;  Surgeon: Romie Levee, MD;  Location: [facility];  Service: General;  Laterality: N/A;  laparoscopic assisted colostomy reversal and partial colectomy   ESOPHAGOGASTRODUODENOSCOPY (EGD) WITH PROPOFOL N/A 10/07/2020   non-bleeding gastric ulcer . Pathology with H.pylori negative   ESOPHAGOGASTRODUODENOSCOPY (EGD) WITH PROPOFOL N/A 12/27/2020   Procedure: ESOPHAGOGASTRODUODENOSCOPY (EGD) WITH PROPOFOL;  Surgeon: Lanelle Bal, DO;  [facility];  Service: Endoscopy;  Laterality: N/A;  1:00pm   FLEXIBLE SIGMOIDOSCOPY N/A 01/11/2020   Procedure: FLEXIBLE SIGMOIDOSCOPY;  Surgeon: Jeani Hawking, MD;  Location: [facility];  Service: Gastroenterology;  Laterality: N/A;   INCISION AND DRAINAGE PERIRECTAL ABSCESS N/A 12/25/2019   Procedure: IRRIGATION AND DEBRIDEMENT BUTTOCKS, LAP LOOP COLOSTOMY;  Surgeon: Gaynelle Adu, MD;  Location: [facility];  Service: General;  Laterality: N/A;   IRRIGATION AND DEBRIDEMENT ABSCESS N/A 12/23/2019   Procedure: EXCISION  AND DEBRIDEMENT LEFT BUTTOCK AND PERINEUM;  Surgeon: Berna Bue, MD;  Location: [facility];  Service: General;  Laterality: N/A;   LAPAROSCOPIC LOOP COLOSTOMY N/A 12/25/2019   Procedure: LAPAROSCOPIC LOOP COLOSTOMY;  Surgeon: Gaynelle Adu, MD;  Location: [facility];  Service: General;  Laterality: N/A;   TONSILLECTOMY      Current Outpatient Medications  Medication Sig Dispense Refill   cephALEXin (KEFLEX) 500 MG capsule Take 1 capsule (500 mg total) by mouth 3 (three) times daily. 21 capsule 0   cholecalciferol (VITAMIN D3) 25 MCG (1000 UNIT) tablet Take 1,000 Units by mouth daily.     famotidine (PEPCID) 20 MG tablet Take 20 mg by mouth daily.     fluticasone (FLONASE) 50 MCG/ACT nasal spray Place 2 sprays into both nostrils daily.     gabapentin (NEURONTIN) 100 MG capsule Take 1 capsule (100 mg total) by mouth at bedtime as needed (pain).     hydrOXYzine (ATARAX) 25 MG tablet Take 25 mg by mouth as needed for itching.     Ipratropium-Albuterol (COMBIVENT) 20-100 MCG/ACT AERS respimat Inhale 1 puff into the lungs every 6 (six) hours as needed for wheezing or shortness of breath. 4 g 2   loratadine (CLARITIN) 10 MG tablet Take 1 tablet (10 mg total) by mouth daily. 30 tablet 1   montelukast (SINGULAIR) 10 MG tablet Take 10 mg by mouth at bedtime.     Nystatin (GERHARDT'S BUTT CREAM) CREA Apply 1 Application topically 4 (four) times daily as needed for irritation.     phenazopyridine (PYRIDIUM) 200 MG tablet Take 1 tablet (200 mg total) by mouth 3 (three) times daily. 6 tablet 0   vitamin C (ASCORBIC ACID) 500 MG tablet Take 500 mg by mouth daily.     benzonatate (TESSALON) 100 MG capsule Take 100 mg by mouth 3 (three) times daily as needed. (Patient not taking: Reported on 12/05/2022)     predniSONE (DELTASONE) 20 MG tablet Take 2 Tablets by mouth daily x 2 days; then 1 tablet by mouth daily x 2 days; then half tablet by mouth daily x 3 days and stop prednisone. (  Patient not taking: Reported on  12/05/2022) 10 tablet 0   psyllium (HYDROCIL/METAMUCIL) 95 % PACK Take 1 packet by mouth daily. (Patient not taking: Reported on 12/05/2022) 240 each    No current facility-administered medications for this visit.    Allergies as of 12/05/2022 - Review Complete 12/05/2022  Allergen Reaction Noted   Pantoprazole  02/17/2021   Penicillins Hives 12/21/2019   Oxycodone hcl Rash 10/06/2020    Family History  Problem Relation Age of Onset   Colon polyps Mother        does not believe adenomas   Arrhythmia Mother    Colon cancer Neg Hx     Social History   Socioeconomic History   Marital status: Single    Spouse name: Not on file   Number of children: 1   Years of education: Not on file   Highest education level: Not on file  Occupational History   Occupation: unknown  Tobacco Use   Smoking status: Never   Smokeless tobacco: Never  Vaping Use   Vaping Use: Never used  Substance and Sexual Activity   Alcohol use: Not Currently   Drug use: Not Currently   Sexual activity: Not Currently  Other Topics Concern   Not on file  Social History Narrative   Patient found in hotel room; unsure of living situation prior.   Social Determinants of Health   Financial Resource Strain: Not on file  Food Insecurity: Not on file  Transportation Needs: Not on file  Physical Activity: Not on file  Stress: Not on file  Social Connections: Not on file  Intimate Partner Violence: Not on file     Review of Systems   Gen: Denies any fever, chills, fatigue, weight loss, lack of appetite.  CV: Denies chest pain, heart palpitations, peripheral edema, syncope.  Resp: Denies shortness of breath at rest or with exertion. Denies wheezing or cough.  GI: Denies dysphagia or odynophagia. Denies jaundice, hematemesis, fecal incontinence. GU : Denies urinary burning, urinary frequency, urinary hesitancy MS: Denies joint pain, muscle weakness, cramps, or limitation of movement.  Derm: Denies rash,  itching, dry skin Psych: Denies depression, anxiety, memory loss, and confusion Heme: Denies bruising, bleeding, and enlarged lymph nodes.   Physical Exam   BP 97/67   Pulse 88   Temp 97.9 F (36.6 C)   Ht 4\' 9"  (1.448 m)   Wt 193 lb 3.2 oz (87.6 kg)   BMI 41.81 kg/m  General:   Alert and oriented. Pleasant and cooperative. Well-nourished and well-developed.  Head:  Normocephalic and atraumatic. Eyes:  Without icterus Abdomen:  +BS, soft, non-tender and non-distended. No HSM noted. No guarding or rebound. No masses appreciated.  Rectal:  skin breakdown in between buttocks, site of prior healed necrotizing fasciitis wound, granulating tissue, well-demarcated edges.  Msk:  Symmetrical without gross deformities. Normal posture. Extremities:  Without edema. Neurologic:  Alert and  oriented x4;  grossly normal neurologically. Skin:  Intact without significant lesions or rashes. Psych:  Alert and cooperative. Normal mood and affect.   Assessment   Alyssa Carey is a 52 y.o. female presenting today with a history of  necrotizing fasciitis s/p diverting colostomy June 2021 complicated by parastomal hernia, small bowel obstruction January/February 2023, previous GI bleed due to peptic ulcer disease. July 2023 she underwent lap assisted colostomy takedown and partial colectomy by Dr. Maisie Fus. History of alcoholic hepatitis and concern for cirrhosis on scan.    Elevated LFTs: with concern for cirrhosis recently  on scan. Known history of hepatic steatosis. No alcohol in 6-7 months. Recent platelets 224. Prior intermittently fluctuating with mild thrombocytopenia. Abnormal LFTs May 2024 with Tbili 1.3, ALk Phos 194, AST 126, ALT 54. Persistently elevated with predominance of AST compared to ALT. We will update serologies at this point (prior Hep A, B, C negative). Elastography ordered. 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   Chronically loose stools: postprandial following  colostomy takedown. Start dicyclomine. Noted to have skin breakdown between buttocks at site of prior necrotizing fasciitis. Refer to wound clinic.     PLAN    Extensive serologies Elastography Trial of dicyclomine Referral to wound clinic Follow-up in 6 weeks    Gelene Mink, PhD, ANP-BC [facility] Gastroenterology

## 2022-12-05 NOTE — Progress Notes (Signed)
error 

## 2022-12-06 NOTE — Telephone Encounter (Signed)
Pt informed of [place] appointment date, time and instructions verbalized understanding.

## 2022-12-06 NOTE — Telephone Encounter (Signed)
LMTRC

## 2022-12-07 LAB — IGG, IGA, IGM
IgA: 603 mg/dL — ABNORMAL HIGH (ref 87–352)
IgG (Immunoglobin G), Serum: 1058 mg/dL (ref 586–1602)
IgM (Immunoglobulin M), Srm: 84 mg/dL (ref 26–217)

## 2022-12-07 LAB — CERULOPLASMIN: Ceruloplasmin: 35.2 mg/dL (ref 19.0–39.0)

## 2022-12-08 LAB — ANTI-SMOOTH MUSCLE ANTIBODY, IGG: F-Actin IgG: 6 Units (ref 0–19)

## 2022-12-08 LAB — ANTINUCLEAR ANTIBODIES, IFA: ANA Ab, IFA: NEGATIVE

## 2022-12-08 LAB — MITOCHONDRIAL ANTIBODIES: Mitochondrial M2 Ab, IgG: <20 Units (ref 0.0–20.0)

## 2022-12-08 LAB — AFP TUMOR MARKER: AFP, Serum, Tumor Marker: 4.3 ng/mL (ref 0.0–6.4)

## 2022-12-09 LAB — TISSUE TRANSGLUTAMINASE, IGA: Tissue Transglutaminase Ab, IgA: <2 U/mL (ref 0–3)

## 2022-12-16 DIAGNOSIS — R32 Unspecified urinary incontinence: Secondary | ICD-10-CM | POA: Diagnosis not present

## 2022-12-26 ENCOUNTER — Ambulatory Visit (HOSPITAL_COMMUNITY): Payer: Medicaid Other

## 2022-12-29 DIAGNOSIS — S92355D Nondisplaced fracture of fifth metatarsal bone, left foot, subsequent encounter for fracture with routine healing: Secondary | ICD-10-CM | POA: Diagnosis not present

## 2023-01-02 ENCOUNTER — Other Ambulatory Visit: Payer: Self-pay

## 2023-01-09 DIAGNOSIS — R030 Elevated blood-pressure reading, without diagnosis of hypertension: Secondary | ICD-10-CM | POA: Diagnosis not present

## 2023-01-09 DIAGNOSIS — L209 Atopic dermatitis, unspecified: Secondary | ICD-10-CM | POA: Diagnosis not present

## 2023-01-09 DIAGNOSIS — Z6837 Body mass index (BMI) 37.0-37.9, adult: Secondary | ICD-10-CM | POA: Diagnosis not present

## 2023-01-16 ENCOUNTER — Other Ambulatory Visit: Payer: Self-pay

## 2023-01-16 ENCOUNTER — Inpatient Hospital Stay (HOSPITAL_COMMUNITY)
Admission: EM | Admit: 2023-01-16 | Discharge: 2023-01-19 | DRG: 378 | Disposition: A | Discharge status: Home / Self Care | Payer: 59 | Attending: Family Medicine | Admitting: Family Medicine

## 2023-01-16 ENCOUNTER — Encounter: Payer: Self-pay | Admitting: Gastroenterology

## 2023-01-16 ENCOUNTER — Encounter (HOSPITAL_COMMUNITY): Payer: Self-pay | Admitting: *Deleted

## 2023-01-16 ENCOUNTER — Ambulatory Visit: Payer: Medicaid Other | Admitting: Gastroenterology

## 2023-01-16 DIAGNOSIS — D62 Acute posthemorrhagic anemia: Secondary | ICD-10-CM | POA: Diagnosis present

## 2023-01-16 DIAGNOSIS — K921 Melena: Secondary | ICD-10-CM | POA: Diagnosis not present

## 2023-01-16 DIAGNOSIS — K7030 Alcoholic cirrhosis of liver without ascites: Secondary | ICD-10-CM | POA: Diagnosis present

## 2023-01-16 DIAGNOSIS — B3731 Acute candidiasis of vulva and vagina: Secondary | ICD-10-CM | POA: Diagnosis present

## 2023-01-16 DIAGNOSIS — E46 Unspecified protein-calorie malnutrition: Secondary | ICD-10-CM | POA: Diagnosis present

## 2023-01-16 DIAGNOSIS — R945 Abnormal results of liver function studies: Secondary | ICD-10-CM | POA: Diagnosis not present

## 2023-01-16 DIAGNOSIS — D649 Anemia, unspecified: Secondary | ICD-10-CM | POA: Diagnosis not present

## 2023-01-16 DIAGNOSIS — E8809 Other disorders of plasma-protein metabolism, not elsewhere classified: Secondary | ICD-10-CM | POA: Diagnosis present

## 2023-01-16 DIAGNOSIS — K760 Fatty (change of) liver, not elsewhere classified: Secondary | ICD-10-CM | POA: Diagnosis present

## 2023-01-16 DIAGNOSIS — R112 Nausea with vomiting, unspecified: Secondary | ICD-10-CM | POA: Diagnosis not present

## 2023-01-16 DIAGNOSIS — Z6841 Body Mass Index (BMI) 40.0 and over, adult: Secondary | ICD-10-CM

## 2023-01-16 DIAGNOSIS — R197 Diarrhea, unspecified: Secondary | ICD-10-CM | POA: Diagnosis not present

## 2023-01-16 DIAGNOSIS — R000 Tachycardia, unspecified: Secondary | ICD-10-CM | POA: Diagnosis not present

## 2023-01-16 DIAGNOSIS — Z888 Allergy status to other drugs, medicaments and biological substances status: Secondary | ICD-10-CM

## 2023-01-16 DIAGNOSIS — F1010 Alcohol abuse, uncomplicated: Secondary | ICD-10-CM | POA: Diagnosis present

## 2023-01-16 DIAGNOSIS — K257 Chronic gastric ulcer without hemorrhage or perforation: Secondary | ICD-10-CM | POA: Diagnosis present

## 2023-01-16 DIAGNOSIS — K922 Gastrointestinal hemorrhage, unspecified: Secondary | ICD-10-CM | POA: Diagnosis present

## 2023-01-16 DIAGNOSIS — E871 Hypo-osmolality and hyponatremia: Secondary | ICD-10-CM | POA: Diagnosis present

## 2023-01-16 DIAGNOSIS — Z880 Allergy status to penicillin: Secondary | ICD-10-CM

## 2023-01-16 DIAGNOSIS — R300 Dysuria: Secondary | ICD-10-CM | POA: Diagnosis present

## 2023-01-16 DIAGNOSIS — E860 Dehydration: Secondary | ICD-10-CM | POA: Diagnosis present

## 2023-01-16 DIAGNOSIS — Z79899 Other long term (current) drug therapy: Secondary | ICD-10-CM

## 2023-01-16 DIAGNOSIS — I10 Essential (primary) hypertension: Secondary | ICD-10-CM | POA: Diagnosis present

## 2023-01-16 DIAGNOSIS — Z885 Allergy status to narcotic agent status: Secondary | ICD-10-CM

## 2023-01-16 LAB — URINALYSIS, ROUTINE W REFLEX MICROSCOPIC
Bilirubin Urine: NEGATIVE
Glucose, UA: NEGATIVE mg/dL
Hgb urine dipstick: NEGATIVE
Ketones, ur: 20 mg/dL — AB
Leukocytes,Ua: NEGATIVE
Nitrite: NEGATIVE
Protein, ur: NEGATIVE mg/dL
Specific Gravity, Urine: 1.018 (ref 1.005–1.030)
pH: 5 (ref 5.0–8.0)

## 2023-01-16 LAB — COMPREHENSIVE METABOLIC PANEL
ALT: 32 U/L (ref 0–44)
AST: 60 U/L — ABNORMAL HIGH (ref 15–41)
Albumin: 3.2 g/dL — ABNORMAL LOW (ref 3.5–5.0)
Alkaline Phosphatase: 159 U/L — ABNORMAL HIGH (ref 38–126)
Anion gap: 10 (ref 5–15)
BUN: 14 mg/dL (ref 6–20)
CO2: 22 mmol/L (ref 22–32)
Calcium: 8.9 mg/dL (ref 8.9–10.3)
Chloride: 101 mmol/L (ref 98–111)
Creatinine, Ser: 0.58 mg/dL (ref 0.44–1.00)
GFR, Estimated: >60 mL/min (ref >60)
Glucose, Bld: 105 mg/dL — ABNORMAL HIGH (ref 70–99)
Potassium: 3.9 mmol/L (ref 3.5–5.1)
Sodium: 133 mmol/L — ABNORMAL LOW (ref 135–145)
Total Bilirubin: 1.8 mg/dL — ABNORMAL HIGH (ref 0.3–1.2)
Total Protein: 7.1 g/dL (ref 6.5–8.1)

## 2023-01-16 LAB — CBC
HCT: 28.4 % — ABNORMAL LOW (ref 36.0–46.0)
Hemoglobin: 9.1 g/dL — ABNORMAL LOW (ref 12.0–15.0)
MCH: 26.5 pg (ref 26.0–34.0)
MCHC: 32 g/dL (ref 30.0–36.0)
MCV: 82.8 fL (ref 80.0–100.0)
Platelets: 210 10*3/uL (ref 150–400)
RBC: 3.43 MIL/uL — ABNORMAL LOW (ref 3.87–5.11)
RDW: 16.3 % — ABNORMAL HIGH (ref 11.5–15.5)
WBC: 5.6 10*3/uL (ref 4.0–10.5)
nRBC: 0 % (ref 0.0–0.2)

## 2023-01-16 LAB — POC OCCULT BLOOD, ED: Fecal Occult Blood: POSITIVE

## 2023-01-16 LAB — LIPASE, BLOOD: Lipase: 24 U/L (ref 11–51)

## 2023-01-16 MED ORDER — IPRATROPIUM-ALBUTEROL 0.5-2.5 (3) MG/3ML IN SOLN: 3.0000 mL | Freq: Four times a day (QID) | RESPIRATORY_TRACT | Status: DC | PRN

## 2023-01-16 MED ORDER — PANTOPRAZOLE SODIUM 40 MG IV SOLR
40.0000 mg | Freq: Once | INTRAVENOUS | Status: AC
  Administered 2023-01-16: 40 mg via INTRAVENOUS
  Filled 2023-01-16: qty 10

## 2023-01-16 MED ORDER — SODIUM CHLORIDE 0.9 % IV SOLN: INTRAVENOUS | Status: DC

## 2023-01-16 MED ORDER — MORPHINE SULFATE (PF) 2 MG/ML IV SOLN
1.0000 mg | INTRAVENOUS | Status: DC | PRN
  Administered 2023-01-16 – 2023-01-18 (×6): 1 mg via INTRAVENOUS
  Filled 2023-01-16 (×7): qty 1

## 2023-01-16 MED ORDER — PROCHLORPERAZINE EDISYLATE 10 MG/2ML IJ SOLN: 10.0000 mg | Freq: Four times a day (QID) | INTRAMUSCULAR | Status: DC | PRN

## 2023-01-16 MED ORDER — METOCLOPRAMIDE HCL 10 MG PO TABS
10.0000 mg | ORAL_TABLET | Freq: Once | ORAL | Status: AC
  Administered 2023-01-16: 10 mg via ORAL
  Filled 2023-01-16: qty 1

## 2023-01-16 MED ORDER — HYDRALAZINE HCL 20 MG/ML IJ SOLN: 10.0000 mg | Freq: Four times a day (QID) | INTRAMUSCULAR | Status: DC | PRN

## 2023-01-16 MED ORDER — DIPHENHYDRAMINE HCL 50 MG/ML IJ SOLN
25.0000 mg | Freq: Four times a day (QID) | INTRAMUSCULAR | Status: DC | PRN
  Administered 2023-01-17: 25 mg via INTRAVENOUS
  Filled 2023-01-16: qty 1

## 2023-01-16 MED ORDER — IPRATROPIUM-ALBUTEROL 20-100 MCG/ACT IN AERS: 1.0000 | INHALATION_SPRAY | Freq: Four times a day (QID) | RESPIRATORY_TRACT | Status: DC | PRN

## 2023-01-16 MED ORDER — ONDANSETRON 4 MG PO TBDP
4.0000 mg | ORAL_TABLET | Freq: Once | ORAL | Status: AC
  Administered 2023-01-16: 4 mg via ORAL
  Filled 2023-01-16: qty 1

## 2023-01-16 MED ORDER — ACETAMINOPHEN 325 MG PO TABS
650.0000 mg | ORAL_TABLET | Freq: Four times a day (QID) | ORAL | Status: DC | PRN
  Administered 2023-01-18 (×2): 650 mg via ORAL
  Filled 2023-01-16 (×2): qty 2

## 2023-01-16 MED ORDER — MONTELUKAST SODIUM 10 MG PO TABS
10.0000 mg | ORAL_TABLET | Freq: Every day | ORAL | Status: DC
  Administered 2023-01-16 – 2023-01-18 (×3): 10 mg via ORAL
  Filled 2023-01-16 (×3): qty 1

## 2023-01-16 MED ORDER — ACETAMINOPHEN 650 MG RE SUPP: 650.0000 mg | Freq: Four times a day (QID) | RECTAL | Status: DC | PRN

## 2023-01-16 MED ORDER — LORATADINE 10 MG PO TABS
10.0000 mg | ORAL_TABLET | Freq: Every day | ORAL | Status: DC
  Administered 2023-01-17 – 2023-01-19 (×3): 10 mg via ORAL
  Filled 2023-01-16 (×3): qty 1

## 2023-01-16 MED ORDER — TRAZODONE HCL 50 MG PO TABS
100.0000 mg | ORAL_TABLET | Freq: Every day | ORAL | Status: DC
  Administered 2023-01-16 – 2023-01-18 (×3): 100 mg via ORAL
  Filled 2023-01-16 (×3): qty 2

## 2023-01-16 MED ORDER — GERHARDT'S BUTT CREAM
TOPICAL_CREAM | Freq: Two times a day (BID) | CUTANEOUS | Status: DC
  Administered 2023-01-17: 1 via TOPICAL
  Filled 2023-01-16: qty 1

## 2023-01-16 MED ORDER — SODIUM CHLORIDE 0.9 % IV BOLUS
1000.0000 mL | Freq: Once | INTRAVENOUS | Status: AC
  Administered 2023-01-16: 1000 mL via INTRAVENOUS

## 2023-01-16 MED ORDER — FAMOTIDINE IN NACL 20-0.9 MG/50ML-% IV SOLN
20.0000 mg | Freq: Two times a day (BID) | INTRAVENOUS | Status: DC
  Administered 2023-01-16 – 2023-01-18 (×3): 20 mg via INTRAVENOUS
  Filled 2023-01-16 (×4): qty 50

## 2023-01-16 NOTE — ED Provider Notes (Signed)
[place] EMERGENCY DEPARTMENT AT [facility] Provider Note   CSN: [id number] Arrival date & time: 01/16/23  1118     History  Chief Complaint  Patient presents with   Emesis    Alyssa Carey is a 52 y.o. female.  Nausea vomiting and diarrhea x 3 days. Denies any sick contacts or bad food exposure.  States she became concerned when she could not keep anything down and started having black tarry stools over the past 2 days, denies history of aspirin or other NSAID.  She has history of colostomy due to severe hemorrhoids she reports though notes show this is from necrotizing fasciitis.  This has been since reversed.  Also has history of alcohol abuse, hematemesis.  Also has history of hyponatremia   Emesis      Home Medications Prior to Admission medications   Medication Sig Start Date End Date Taking? Authorizing Provider  cephALEXin (KEFLEX) 500 MG capsule Take 1 capsule (500 mg total) by mouth 3 (three) times daily. 11/22/22   Geoffery Lyons, MD  cholecalciferol (VITAMIN D3) 25 MCG (1000 UNIT) tablet Take 1,000 Units by mouth daily.    [provider]  famotidine (PEPCID) 20 MG tablet Take 20 mg by mouth daily. 11/11/21   [provider]  fluticasone (FLONASE) 50 MCG/ACT nasal spray Place 2 sprays into both nostrils daily. 09/15/22   [provider]  gabapentin (NEURONTIN) 100 MG capsule Take 1 capsule (100 mg total) by mouth at bedtime as needed (pain). 11/13/22   Vassie Loll, MD  hydrocortisone (ANUSOL-HC) 2.5 % rectal cream Place 1 Application rectally 2 (two) times daily. 12/05/22   Gelene Mink, NP  hydrOXYzine (ATARAX) 25 MG tablet Take 25 mg by mouth as needed for itching. 09/28/22   [provider]  Ipratropium-Albuterol (COMBIVENT) 20-100 MCG/ACT AERS respimat Inhale 1 puff into the lungs every 6 (six) hours as needed for wheezing or shortness of breath. 11/13/22   Vassie Loll, MD  loratadine (CLARITIN) 10 MG tablet Take 1  tablet (10 mg total) by mouth daily. 11/14/22   Vassie Loll, MD  montelukast (SINGULAIR) 10 MG tablet Take 10 mg by mouth at bedtime. 09/19/22   [provider]  Nystatin (GERHARDT'S BUTT CREAM) CREA Apply 1 Application topically 4 (four) times daily as needed for irritation. 11/13/22   Vassie Loll, MD  phenazopyridine (PYRIDIUM) 200 MG tablet Take 1 tablet (200 mg total) by mouth 3 (three) times daily. 11/22/22   Geoffery Lyons, MD  vitamin C (ASCORBIC ACID) 500 MG tablet Take 500 mg by mouth daily.    [provider]      Allergies    Pantoprazole, Penicillins, and Oxycodone hcl    Review of Systems   Review of Systems  Gastrointestinal:  Positive for vomiting.    Physical Exam Updated Vital Signs BP 130/73   Pulse (!) 108   Temp 98.5 F (36.9 C) (Oral)   Resp 19   Ht 4\' 9"  (1.448 m)   Wt 83 kg   SpO2 100%   BMI 39.60 kg/m  Physical Exam Vitals and nursing note reviewed. Exam conducted with a chaperone present.  Constitutional:      General: She is not in acute distress.    Appearance: She is well-developed.  HENT:     Head: Normocephalic and atraumatic.     Mouth/Throat:     Mouth: Mucous membranes are moist.  Eyes:     Conjunctiva/sclera: Conjunctivae normal.  Cardiovascular:  Rate and Rhythm: Normal rate and regular rhythm.     Heart sounds: No murmur heard. Pulmonary:     Effort: Pulmonary effort is normal. No respiratory distress.     Breath sounds: Normal breath sounds.  Abdominal:     General: There is no distension.     Palpations: Abdomen is soft. There is no mass.     Tenderness: There is no abdominal tenderness.     Hernia: No hernia is present.  Genitourinary:    Rectum: Normal. Guaiac result positive.  Musculoskeletal:        General: No swelling.     Cervical back: Neck supple.  Skin:    General: Skin is warm and dry.     Capillary Refill: Capillary refill takes less than 2 seconds.  Neurological:     General: No focal  deficit present.     Mental Status: She is alert and oriented to person, place, and time.     Motor: No weakness.     Gait: Gait normal.  Psychiatric:        Mood and Affect: Mood normal.     ED Results / Procedures / Treatments   Labs (all labs ordered are listed, but only abnormal results are displayed) Labs Reviewed  URINALYSIS, ROUTINE W REFLEX MICROSCOPIC - Abnormal; Notable for the following components:      Result Value   Ketones, ur 20 (*)    All other components within normal limits  CBC - Abnormal; Notable for the following components:   RBC 3.43 (*)    Hemoglobin 9.1 (*)    HCT 28.4 (*)    RDW 16.3 (*)    All other components within normal limits  COMPREHENSIVE METABOLIC PANEL - Abnormal; Notable for the following components:   Sodium 133 (*)    Glucose, Bld 105 (*)    Albumin 3.2 (*)    AST 60 (*)    Alkaline Phosphatase 159 (*)    Total Bilirubin 1.8 (*)    All other components within normal limits  POC OCCULT BLOOD, ED - Abnormal  GASTROINTESTINAL PANEL BY PCR, STOOL (REPLACES STOOL CULTURE)  C DIFFICILE QUICK SCREEN W PCR REFLEX    LIPASE, BLOOD  COMPREHENSIVE METABOLIC PANEL  CBC  LACTOFERRIN, FECAL, QUALITATIVE    EKG EKG Interpretation Date/Time:  Tuesday January 16 2023 14:08:21 EDT Ventricular Rate:  104 PR Interval:  142 QRS Duration:  59 QT Interval:  386 QTC Calculation: 508 R Axis:   -16  Text Interpretation: Sinus tachycardia Borderline left axis deviation Borderline repolarization abnormality Borderline prolonged QT interval Confirmed by Vonita Moss [phone]) on 01/16/2023 4:38:05 PM  Radiology No results found.  Procedures Ultrasound ED Peripheral IV (Provider)  Date/Time: 01/16/2023 2:02 PM  Performed by: Ma Rings, PA-C Authorized by: Ma Rings, PA-C   Procedure details:    Indications: hydration, multiple failed IV attempts and poor IV access     Skin Prep: chlorhexidine gluconate     Location: right upper  arm.   Angiocath:  18 G   Bedside Ultrasound Guided: Yes     Images: not archived     Patient tolerated procedure without complications: Yes     Dressing applied: Yes   Ultrasound ED Peripheral IV (Provider)  Date/Time: 01/16/2023 6:05 PM  Performed by: Ma Rings, PA-C Authorized by: Ma Rings, PA-C   Procedure details:    Indications: hydration, multiple failed IV attempts and poor IV access     Skin  Prep: chlorhexidine gluconate     Location: left upper arm.   Angiocath:  20 G   Bedside Ultrasound Guided: Yes     Images: archived     Patient tolerated procedure without complications: Yes     Dressing applied: Yes       Medications Ordered in ED Medications  loratadine (CLARITIN) tablet 10 mg (has no administration in time range)  montelukast (SINGULAIR) tablet 10 mg (has no administration in time range)  0.9 %  sodium chloride infusion (has no administration in time range)  acetaminophen (TYLENOL) tablet 650 mg (has no administration in time range)    Or  acetaminophen (TYLENOL) suppository 650 mg (has no administration in time range)  morphine (PF) 2 MG/ML injection 1 mg (has no administration in time range)  prochlorperazine (COMPAZINE) injection 10 mg (has no administration in time range)  hydrALAZINE (APRESOLINE) injection 10 mg (has no administration in time range)  famotidine (PEPCID) IVPB 20 mg premix (has no administration in time range)  ipratropium-albuterol (DUONEB) 0.5-2.5 (3) MG/3ML nebulizer solution 3 mL (has no administration in time range)  sodium chloride 0.9 % bolus 1,000 mL (0 mLs Intravenous Stopped 01/16/23 1622)  metoCLOPramide (REGLAN) tablet 10 mg (10 mg Oral Given 01/16/23 1619)  pantoprazole (PROTONIX) injection 40 mg (40 mg Intravenous Given 01/16/23 1858)  ondansetron (ZOFRAN-ODT) disintegrating tablet 4 mg (4 mg Oral Given 01/16/23 1858)    ED Course/ Medical Decision Making/ A&P                             Medical Decision  Making This patient presents to the ED for concern of nausea,vomiting diarrhea, black tarry stool.  This involves an extensive number of treatment options, and is a complaint that carries with it a high risk of complications and morbidity.  The differential diagnosis includes gastroenteritis, gastritis, peptic ulcer disease, GI bleeding, anemia, electrolyte abnormality, other   Co morbidities that complicate the patient evaluation :   History of hyponatremia, alcoholic liver disease   Additional history obtained:  Additional history obtained from EMR External records from outside source obtained and reviewed including notes   Lab Tests:  I Ordered, and personally interpreted labs.  The pertinent results include: CBC shows worsening anemia, no leukocytosis, CMP shows very mild hyponatremia, elevated alk phos and elevated bilirubin     Cardiac Monitoring: / EKG:  The patient was maintained on a cardiac monitor.  I personally viewed and interpreted the cardiac monitored which showed an underlying rhythm of: Sinus rhythm, QT slightly prolonged QTc of 508   Consultations Obtained:  I requested consultation with the GI doctor, Dr. Earmon Phoenix ,  and discussed lab and imaging findings as well as pertinent plan - they recommend: N.p.o. and admit to hospitalist for likely scope in the morning.   Problem List / ED Course / Critical interventions / Medication management  Nausea vomiting and diarrhea with black tarry stool, guaiac positive brown stool on exam.  Patient still having nausea, being judicious with antiemetics due to her QT prolongation.  Labs show anemia decreased from about 10-9 over the past month, she has been around 9 in the past but is having bleeding for 2 days and appear to be dehydrated on exam due to her nausea vomiting and likely it is lower than that.  Patient has very difficult IV access.  Nursing was unable to obtain IV access even with ultrasound.  I placed 2 ultrasound  IVs at 2 different times and both unfortunately infiltrated after patient got up and went to the bathroom despite having these secured with excessive bandaging.  Heart rate is improving, no significant improvement with UA and so she is given ODT Zofran, admitted for likely scope tomorrow for her GI bleeding.  Not having hematemesis, no nausea or vomiting in the ED.  Bloody stools here.  I have reviewed the patients home medicines and have made adjustments as needed       Amount and/or Complexity of Data Reviewed Labs: ordered.  Risk Prescription drug management. Decision regarding hospitalization.           Final Clinical Impression(s) / ED Diagnoses Final diagnoses:  Nausea vomiting and diarrhea  Gastrointestinal hemorrhage, unspecified gastrointestinal hemorrhage type  Anemia, unspecified type    Rx / DC Orders ED Discharge Orders     None         Josem Kaufmann 01/16/23 1926    Rondel Baton, MD 01/17/23 [phone]

## 2023-01-16 NOTE — H&P (Addendum)
History and Physical    Patient: Alyssa Carey EXB:[id number] DOB: [birth year] DOA: 01/16/2023 DOS: the patient was seen and examined on 01/16/2023 PCP: Richardean Chimera, MD  Patient coming from: Home  Chief Complaint:  Chief Complaint  Patient presents with   Emesis   HPI: Alyssa Carey is a 52 y.o. female with medical history significant of necrotizing fascitis s/p colostomy with later reversal, hypertension, hx of etoh abuse, cirrhosis, hx of rectal ulcer, prior EGD 12/27/20-> negative, hyponatremia, presents with 2 days of n/v, lower abd pain, diarrhea (2-3x per day), and black stool.  Pt denies fever, chills, cough, cp, palp, sob, dysuria, hematuria.  ED spoke with Dr. Mallie Darting who recommended NPO and will see in AM. Pt will be admitted for blood in stool.  FOBT +  Ironically, pt had appt today with Dr. Kendell Bane but didn't keep it due to coming to ED.   Review of Systems: As mentioned in the history of present illness. All other systems reviewed and are negative. Past Medical History:  Diagnosis Date   Alcohol abuse    HTN (hypertension) 10/05/2020   Necrotizing fasciitis [facility])    Past Surgical History:  Procedure Laterality Date   BIOPSY  10/07/2020   Procedure: BIOPSY;  Surgeon: Corbin Ade, MD;  [facility];  Service: Endoscopy;;   COLONOSCOPY WITH PROPOFOL N/A 10/07/2020   single healing rectal ulcer in distal rectum with surrounding mucosal friability and markedly abnormal proximal colon with ulceration s/p biopsies.   COLONOSCOPY WITH PROPOFOL  02/22/2021   Procedure: COLONOSCOPY WITH PROPOFOL;  Surgeon: Lanelle Bal, DO;  [facility];  Service: Endoscopy;;   COLOSTOMY TAKEDOWN N/A 01/26/2022   Procedure: LAPAROSCOPIC COLOSTOMY TAKEDOWN;  Surgeon: Romie Levee, MD;  [facility];  Service: General;  Laterality: N/A;  laparoscopic assisted colostomy reversal and partial colectomy   ESOPHAGOGASTRODUODENOSCOPY (EGD) WITH PROPOFOL N/A  10/07/2020   non-bleeding gastric ulcer . Pathology with H.pylori negative   ESOPHAGOGASTRODUODENOSCOPY (EGD) WITH PROPOFOL N/A 12/27/2020   Procedure: ESOPHAGOGASTRODUODENOSCOPY (EGD) WITH PROPOFOL;  Surgeon: Lanelle Bal, DO;  [facility];  Service: Endoscopy;  Laterality: N/A;  1:00pm   FLEXIBLE SIGMOIDOSCOPY N/A 01/11/2020   Procedure: FLEXIBLE SIGMOIDOSCOPY;  Surgeon: Jeani Hawking, MD;  [facility];  Service: Gastroenterology;  Laterality: N/A;   INCISION AND DRAINAGE PERIRECTAL ABSCESS N/A 12/25/2019   Procedure: IRRIGATION AND DEBRIDEMENT BUTTOCKS, LAP LOOP COLOSTOMY;  Surgeon: Gaynelle Adu, MD;  [facility];  Service: General;  Laterality: N/A;   IRRIGATION AND DEBRIDEMENT ABSCESS N/A 12/23/2019   Procedure: EXCISION AND DEBRIDEMENT LEFT BUTTOCK AND PERINEUM;  Surgeon: Berna Bue, MD;  [facility];  Service: General;  Laterality: N/A;   LAPAROSCOPIC LOOP COLOSTOMY N/A 12/25/2019   Procedure: LAPAROSCOPIC LOOP COLOSTOMY;  Surgeon: Gaynelle Adu, MD;  [facility];  Service: General;  Laterality: N/A;   TONSILLECTOMY     Social History:  reports that she has never smoked. She has never used smokeless tobacco. She reports that she does not currently use alcohol. She reports that she does not currently use drugs.  Allergies  Allergen Reactions   Pantoprazole     Stomach pain   Penicillins Hives    Did it involve swelling of the face/tongue/throat, SOB, or low BP? N Did it involve sudden or severe rash/hives, skin peeling, or any reaction on the inside of your mouth or nose? Y Did you need to seek medical attention at a hospital or  doctor's office? N When did it last happen?  Childhood     If all above answers are "NO", may proceed with cephalosporin use.    Oxycodone Hcl Rash    Family History  Problem Relation Age of Onset   Colon polyps Mother        does not believe adenomas   Arrhythmia Mother    Colon cancer Neg Hx      Prior to Admission medications   Medication Sig Start Date End Date Taking? Authorizing Provider  cephALEXin (KEFLEX) 500 MG capsule Take 1 capsule (500 mg total) by mouth 3 (three) times daily. 11/22/22   Geoffery Lyons, MD  cholecalciferol (VITAMIN D3) 25 MCG (1000 UNIT) tablet Take 1,000 Units by mouth daily.    [provider]  famotidine (PEPCID) 20 MG tablet Take 20 mg by mouth daily. 11/11/21   [provider]  fluticasone (FLONASE) 50 MCG/ACT nasal spray Place 2 sprays into both nostrils daily. 09/15/22   [provider]  gabapentin (NEURONTIN) 100 MG capsule Take 1 capsule (100 mg total) by mouth at bedtime as needed (pain). 11/13/22   Vassie Loll, MD  hydrocortisone (ANUSOL-HC) 2.5 % rectal cream Place 1 Application rectally 2 (two) times daily. 12/05/22   Gelene Mink, NP  hydrOXYzine (ATARAX) 25 MG tablet Take 25 mg by mouth as needed for itching. 09/28/22   [provider]  Ipratropium-Albuterol (COMBIVENT) 20-100 MCG/ACT AERS respimat Inhale 1 puff into the lungs every 6 (six) hours as needed for wheezing or shortness of breath. 11/13/22   Vassie Loll, MD  loratadine (CLARITIN) 10 MG tablet Take 1 tablet (10 mg total) by mouth daily. 11/14/22   Vassie Loll, MD  montelukast (SINGULAIR) 10 MG tablet Take 10 mg by mouth at bedtime. 09/19/22   [provider]  Nystatin (GERHARDT'S BUTT CREAM) CREA Apply 1 Application topically 4 (four) times daily as needed for irritation. 11/13/22   Vassie Loll, MD  phenazopyridine (PYRIDIUM) 200 MG tablet Take 1 tablet (200 mg total) by mouth 3 (three) times daily. 11/22/22   Geoffery Lyons, MD  vitamin C (ASCORBIC ACID) 500 MG tablet Take 500 mg by mouth daily.    [provider]    Physical Exam: Vitals:   01/16/23 1500 01/16/23 1600 01/16/23 1621 01/16/23 1630  BP: 120/65 126/65  130/73  Pulse: (!) 110 (!) 114  (!) 108  Resp: (!) 30   19  Temp:   98.5 F (36.9 C)   TempSrc:   Oral    SpO2: 100% 100%  100%  Weight:      Height:       Heent: anicteric Neck: no jvd Heart: rrr s1, s2, no m/g/r Lung: CTAB Abd: soft, nt , nd, +bs Ext: no c/c/e Skin: no rash Lymph: no adenopathy Neuro: nonfocal  Data Reviewed:   Assessment and Plan: Blood in stool, black stool NPO Hydrate with ns iv Pepcid 20mg  iv bid since allerghic to protonix GI has been consulted (Dr. Levon Hedger) per ED, and will be by in AM  N/v Compazine 10mg  iv q6h prn    Diarrhea Stool studies  Abnormal liver function, Cirrhosis Prior CT abd/ pelvis 11/23/22=> steatosis, cirrhosis Check cmp in am  DVT prophylaxis: SCD FULL CODE Dispo: home   Advance Care Planning:   Code Status: Prior FULL CODE  Consults: GI  Family Communication: no family present  Severity of Illness: The appropriate patient status for this patient is OBSERVATION. Observation status is judged to be  reasonable and necessary in order to provide the required intensity of service to ensure the patient's safety. The patient's presenting symptoms, physical exam findings, and initial radiographic and laboratory data in the context of their medical condition is felt to place them at decreased risk for further clinical deterioration. Furthermore, it is anticipated that the patient will be medically stable for discharge from the hospital within 2 midnights of admission.   Author: Pearson Grippe, MD 01/16/2023 7:00 PM  For on call review [email or web address].

## 2023-01-16 NOTE — ED Notes (Signed)
Pt attempted to urinate and was unsuccessful 

## 2023-01-16 NOTE — ED Triage Notes (Signed)
Pt with emesis and diarrhea, chills since 2 and half days.  Pt received a letter stating her sodium is low.

## 2023-01-17 ENCOUNTER — Encounter (HOSPITAL_COMMUNITY)
Admission: EM | Disposition: A | Discharge status: Home / Self Care | Payer: Self-pay | Source: Home / Self Care | Attending: Family Medicine

## 2023-01-17 ENCOUNTER — Encounter (HOSPITAL_COMMUNITY): Payer: Self-pay | Admitting: Internal Medicine

## 2023-01-17 ENCOUNTER — Observation Stay (HOSPITAL_COMMUNITY): Payer: 59 | Admitting: Anesthesiology

## 2023-01-17 DIAGNOSIS — Z6841 Body Mass Index (BMI) 40.0 and over, adult: Secondary | ICD-10-CM | POA: Diagnosis not present

## 2023-01-17 DIAGNOSIS — D649 Anemia, unspecified: Secondary | ICD-10-CM

## 2023-01-17 DIAGNOSIS — I471 Supraventricular tachycardia, unspecified: Secondary | ICD-10-CM | POA: Diagnosis not present

## 2023-01-17 DIAGNOSIS — K259 Gastric ulcer, unspecified as acute or chronic, without hemorrhage or perforation: Secondary | ICD-10-CM | POA: Diagnosis not present

## 2023-01-17 DIAGNOSIS — R197 Diarrhea, unspecified: Secondary | ICD-10-CM | POA: Diagnosis not present

## 2023-01-17 DIAGNOSIS — R112 Nausea with vomiting, unspecified: Secondary | ICD-10-CM | POA: Diagnosis not present

## 2023-01-17 DIAGNOSIS — I10 Essential (primary) hypertension: Secondary | ICD-10-CM

## 2023-01-17 DIAGNOSIS — K920 Hematemesis: Secondary | ICD-10-CM

## 2023-01-17 DIAGNOSIS — R7989 Other specified abnormal findings of blood chemistry: Secondary | ICD-10-CM

## 2023-01-17 DIAGNOSIS — F1010 Alcohol abuse, uncomplicated: Secondary | ICD-10-CM | POA: Diagnosis not present

## 2023-01-17 DIAGNOSIS — K921 Melena: Secondary | ICD-10-CM | POA: Diagnosis not present

## 2023-01-17 HISTORY — PX: ESOPHAGOGASTRODUODENOSCOPY (EGD) WITH PROPOFOL: SHX5813

## 2023-01-17 LAB — COMPREHENSIVE METABOLIC PANEL
ALT: 26 U/L (ref 0–44)
AST: 45 U/L — ABNORMAL HIGH (ref 15–41)
Albumin: 2.8 g/dL — ABNORMAL LOW (ref 3.5–5.0)
Alkaline Phosphatase: 129 U/L — ABNORMAL HIGH (ref 38–126)
Anion gap: 8 (ref 5–15)
BUN: 14 mg/dL (ref 6–20)
CO2: 21 mmol/L — ABNORMAL LOW (ref 22–32)
Calcium: 8.3 mg/dL — ABNORMAL LOW (ref 8.9–10.3)
Chloride: 106 mmol/L (ref 98–111)
Creatinine, Ser: 0.56 mg/dL (ref 0.44–1.00)
GFR, Estimated: >60 mL/min (ref >60)
Glucose, Bld: 76 mg/dL (ref 70–99)
Potassium: 3.6 mmol/L (ref 3.5–5.1)
Sodium: 135 mmol/L (ref 135–145)
Total Bilirubin: 1.7 mg/dL — ABNORMAL HIGH (ref 0.3–1.2)
Total Protein: 6.2 g/dL — ABNORMAL LOW (ref 6.5–8.1)

## 2023-01-17 LAB — CBC
HCT: 24.5 % — ABNORMAL LOW (ref 36.0–46.0)
Hemoglobin: 7.6 g/dL — ABNORMAL LOW (ref 12.0–15.0)
MCH: 26.7 pg (ref 26.0–34.0)
MCHC: 31 g/dL (ref 30.0–36.0)
MCV: 86 fL (ref 80.0–100.0)
Platelets: 180 10*3/uL (ref 150–400)
RBC: 2.85 MIL/uL — ABNORMAL LOW (ref 3.87–5.11)
RDW: 16.3 % — ABNORMAL HIGH (ref 11.5–15.5)
WBC: 4.7 10*3/uL (ref 4.0–10.5)
nRBC: 0 % (ref 0.0–0.2)

## 2023-01-17 LAB — TYPE AND SCREEN: Antibody Screen: NEGATIVE

## 2023-01-17 LAB — HEMOGLOBIN AND HEMATOCRIT, BLOOD
HCT: 28.7 % — ABNORMAL LOW (ref 36.0–46.0)
Hemoglobin: 8.7 g/dL — ABNORMAL LOW (ref 12.0–15.0)

## 2023-01-17 SURGERY — ESOPHAGOGASTRODUODENOSCOPY (EGD) WITH PROPOFOL
Anesthesia: General

## 2023-01-17 MED ORDER — FAMOTIDINE IN NACL 20-0.9 MG/50ML-% IV SOLN
20.0000 mg | INTRAVENOUS | Status: AC
  Administered 2023-01-17 (×2): 20 mg via INTRAVENOUS
  Filled 2023-01-17: qty 50

## 2023-01-17 MED ORDER — LACTATED RINGERS IV SOLN: INTRAVENOUS | Status: DC | PRN

## 2023-01-17 MED ORDER — LACTATED RINGERS IV SOLN: INTRAVENOUS | Status: DC

## 2023-01-17 MED ORDER — LIDOCAINE HCL (CARDIAC) PF 100 MG/5ML IV SOSY
PREFILLED_SYRINGE | INTRAVENOUS | Status: DC | PRN
  Administered 2023-01-17: 50 mg via INTRAVENOUS

## 2023-01-17 MED ORDER — FLUCONAZOLE 150 MG PO TABS
150.0000 mg | ORAL_TABLET | Freq: Once | ORAL | Status: AC
  Administered 2023-01-17: 150 mg via ORAL
  Filled 2023-01-17: qty 1

## 2023-01-17 MED ORDER — SODIUM CHLORIDE 0.9 % IV SOLN: INTRAVENOUS | Status: DC

## 2023-01-17 MED ORDER — PROPOFOL 10 MG/ML IV BOLUS
INTRAVENOUS | Status: DC | PRN
  Administered 2023-01-17: 100 mg via INTRAVENOUS
  Administered 2023-01-17: 50 mg via INTRAVENOUS
  Administered 2023-01-17: 100 mg via INTRAVENOUS

## 2023-01-17 NOTE — Progress Notes (Signed)
Patient states that her nose is itching and will not let up. Patient thinks it is from the anesthesia. Patient was given benadryl. Symptoms subsided. MD Jarvis Newcomer notified.

## 2023-01-17 NOTE — TOC CM/SW Note (Signed)
Transition of Care [facility]) - Inpatient Brief Assessment   Patient Details  Name: KYERA CORDOVA MRN: [id number] Date of Birth: 1970-09-08  Transition of Care [facility]) CM/SW Contact:    Karn Cassis, LCSW Phone Number: 01/17/2023, 7:25 AM   Clinical Narrative: Pt reports she lives with her son. She works full-time and is independent with ADLs. No needs reported at this time. TOC will follow.     Transition of Care Asessment: Insurance and Status: Insurance coverage has been reviewed Patient has primary care physician: Yes Home environment has been reviewed: Lives with son. Prior level of function:: Independent. Works full-time. Prior/Current Home Services: No current home services Social Determinants of Health Reivew: SDOH reviewed no interventions necessary Readmission risk has been reviewed: Yes Transition of care needs: no transition of care needs at this time

## 2023-01-17 NOTE — Consult Note (Signed)
Gastroenterology Consult   Referring Provider: No ref. provider found Primary Care Physician:  Richardean Chimera, MD Primary Gastroenterologist:  Dr. Marletta Lor   Patient ID: Alyssa Carey; [id number]; 27-Jun-1971   Admit date: 01/16/2023  LOS: 0 days   Date of Consultation: 01/17/2023  Reason for Consultation:  nausea/vomiting/diarrhea/melena   History of Present Illness   Alyssa Carey is a 52 y.o. year old female with history of necrotizing fasciitis s/p diverting colostomy June 2021 complicated by parastomal hernia, small bowel obstruction January/February 2023, previous GI bleed due to PUD, July 2023 she underwent lap assisted colostomy takedown and partial colectomy, previous alcoholic hepatitis who presented to the ED with nausea, vomiting, diarrhea x 3 days.  She also endorsed dark tarry stools.  GI consulted for further evaluation.  ED Course: VSS FOBT positive Hgb 9.1, BUN WNL  AST 60, AP 159, t bili 1.8  Stool studies pending  Consult:  Hgb down to 7.6 today, BUN remains normal, plt count 180k  Reports she was supposed to have outpatient GI follow up yesterday afternoon. The two nights prior to that she had acute onset nausea, vomiting and diarrhea. States that stools were black and tarry. This has tapered off some since being admitted. She endorses fatigue and feeling like she had no energy. She endorses periumbilical abdominal pain. Denies BRB in stools. She notes some coffee ground emesis as well but no BRB present. She notes that stools and emesis initially did not have presence of coffee ground/melena but transitioned to this as symptoms persisted. Notes some chills and mild fever on admission. She takes ibuprofen PRN, she does not typically take it without eating, last took this a few weeks ago but notes she was not using this even every day at that time. Had recent naproxen Rx but only took a few of these as well.  No ETOH or tobacco use.   EGD June 2022: gastritis, normal  duodenum.  Colonoscopy Aug 2022: Preparation of the colon was fair.                           - The rectum is normal.                           - No specimens collected.                           - The rectum appeared normal. Previously noted                            rectal ulcer has healed. Mucosa was quite friable                            with evidence of minor barotrauma with evaluation                            of the rectum itself. All air was suctioned out as                            scope was withdrawn through the remaining colon. No  active colitis. No polyps or evidence of cancer                            identified.    Past Medical History:  Diagnosis Date   Alcohol abuse    HTN (hypertension) 10/05/2020   Necrotizing fasciitis [facility])     Past Surgical History:  Procedure Laterality Date   BIOPSY  10/07/2020   Procedure: BIOPSY;  Surgeon: Corbin Ade, MD;  [facility];  Service: Endoscopy;;   COLONOSCOPY WITH PROPOFOL N/A 10/07/2020   single healing rectal ulcer in distal rectum with surrounding mucosal friability and markedly abnormal proximal colon with ulceration s/p biopsies.   COLONOSCOPY WITH PROPOFOL  02/22/2021   Procedure: COLONOSCOPY WITH PROPOFOL;  Surgeon: Lanelle Bal, DO;  [facility];  Service: Endoscopy;;   COLOSTOMY TAKEDOWN N/A 01/26/2022   Procedure: LAPAROSCOPIC COLOSTOMY TAKEDOWN;  Surgeon: Romie Levee, MD;  [facility];  Service: General;  Laterality: N/A;  laparoscopic assisted colostomy reversal and partial colectomy   ESOPHAGOGASTRODUODENOSCOPY (EGD) WITH PROPOFOL N/A 10/07/2020   non-bleeding gastric ulcer . Pathology with H.pylori negative   ESOPHAGOGASTRODUODENOSCOPY (EGD) WITH PROPOFOL N/A 12/27/2020   Procedure: ESOPHAGOGASTRODUODENOSCOPY (EGD) WITH PROPOFOL;  Surgeon: Lanelle Bal, DO;  [facility];  Service: Endoscopy;  Laterality: N/A;  1:00pm    FLEXIBLE SIGMOIDOSCOPY N/A 01/11/2020   Procedure: FLEXIBLE SIGMOIDOSCOPY;  Surgeon: Jeani Hawking, MD;  [facility];  Service: Gastroenterology;  Laterality: N/A;   INCISION AND DRAINAGE PERIRECTAL ABSCESS N/A 12/25/2019   Procedure: IRRIGATION AND DEBRIDEMENT BUTTOCKS, LAP LOOP COLOSTOMY;  Surgeon: Gaynelle Adu, MD;  [facility];  Service: General;  Laterality: N/A;   IRRIGATION AND DEBRIDEMENT ABSCESS N/A 12/23/2019   Procedure: EXCISION AND DEBRIDEMENT LEFT BUTTOCK AND PERINEUM;  Surgeon: Berna Bue, MD;  [facility];  Service: General;  Laterality: N/A;   LAPAROSCOPIC LOOP COLOSTOMY N/A 12/25/2019   Procedure: LAPAROSCOPIC LOOP COLOSTOMY;  Surgeon: Gaynelle Adu, MD;  [facility];  Service: General;  Laterality: N/A;   TONSILLECTOMY      Prior to Admission medications   Medication Sig Start Date End Date Taking? Authorizing Provider  cephALEXin (KEFLEX) 500 MG capsule Take 1 capsule (500 mg total) by mouth 3 (three) times daily. 11/22/22   Geoffery Lyons, MD  cholecalciferol (VITAMIN D3) 25 MCG (1000 UNIT) tablet Take 1,000 Units by mouth daily.    [provider]  famotidine (PEPCID) 20 MG tablet Take 20 mg by mouth daily. 11/11/21   [provider]  fluticasone (FLONASE) 50 MCG/ACT nasal spray Place 2 sprays into both nostrils daily. 09/15/22   [provider]  gabapentin (NEURONTIN) 100 MG capsule Take 1 capsule (100 mg total) by mouth at bedtime as needed (pain). 11/13/22   Vassie Loll, MD  hydrocortisone (ANUSOL-HC) 2.5 % rectal cream Place 1 Application rectally 2 (two) times daily. 12/05/22   Gelene Mink, NP  hydrOXYzine (ATARAX) 25 MG tablet Take 25 mg by mouth as needed for itching. 09/28/22   [provider]  Ipratropium-Albuterol (COMBIVENT) 20-100 MCG/ACT AERS respimat Inhale 1 puff into the lungs every 6 (six) hours as needed for wheezing or shortness of breath. 11/13/22   Vassie Loll, MD  loratadine (CLARITIN)  10 MG tablet Take 1 tablet (10 mg total) by mouth daily. 11/14/22   Vassie Loll, MD  montelukast (SINGULAIR) 10 MG tablet Take 10 mg by mouth at bedtime.  09/19/22   [provider]  Nystatin (GERHARDT'S BUTT CREAM) CREA Apply 1 Application topically 4 (four) times daily as needed for irritation. 11/13/22   Vassie Loll, MD  phenazopyridine (PYRIDIUM) 200 MG tablet Take 1 tablet (200 mg total) by mouth 3 (three) times daily. 11/22/22   Geoffery Lyons, MD  vitamin C (ASCORBIC ACID) 500 MG tablet Take 500 mg by mouth daily.    [provider]    Current Facility-Administered Medications  Medication Dose Route Frequency Provider Last Rate Last Admin   0.9 %  sodium chloride infusion   Intravenous Continuous Pearson Grippe, MD 100 mL/hr at 01/17/23 0858 New Bag at 01/17/23 0858   acetaminophen (TYLENOL) tablet 650 mg  650 mg Oral Q6H PRN Pearson Grippe, MD       Or   acetaminophen (TYLENOL) suppository 650 mg  650 mg Rectal Q6H PRN Pearson Grippe, MD       diphenhydrAMINE (BENADRYL) injection 25 mg  25 mg Intravenous Q6H PRN Pearson Grippe, MD       famotidine (PEPCID) IVPB 20 mg premix  20 mg Intravenous Delories Heinz, MD 100 mL/hr at 01/17/23 0854 20 mg at 01/17/23 1610   Gerhardt's butt cream   Topical BID Zierle-Ghosh, Asia B, DO   Given at 01/17/23 0857   hydrALAZINE (APRESOLINE) injection 10 mg  10 mg Intravenous Q6H PRN Pearson Grippe, MD       ipratropium-albuterol (DUONEB) 0.5-2.5 (3) MG/3ML nebulizer solution 3 mL  3 mL Nebulization Q6H PRN Pearson Grippe, MD       loratadine (CLARITIN) tablet 10 mg  10 mg Oral Daily Pearson Grippe, MD   10 mg at 01/17/23 0853   montelukast (SINGULAIR) tablet 10 mg  10 mg Oral Farrel Demark, MD   10 mg at 01/16/23 2252   morphine (PF) 2 MG/ML injection 1 mg  1 mg Intravenous Q4H PRN Pearson Grippe, MD   1 mg at 01/17/23 9604   prochlorperazine (COMPAZINE) injection 10 mg  10 mg Intravenous Q6H PRN Pearson Grippe, MD       traZODone (DESYREL) tablet 100 mg  100 mg Oral QHS  Zierle-Ghosh, Asia B, DO   100 mg at 01/16/23 2310    Allergies as of 01/16/2023 - Review Complete 01/16/2023  Allergen Reaction Noted   Pantoprazole  02/17/2021   Penicillins Hives 12/21/2019   Oxycodone hcl Rash 10/06/2020    Family History  Problem Relation Age of Onset   Colon polyps Mother        does not believe adenomas   Arrhythmia Mother    Colon cancer Neg Hx     Social History   Socioeconomic History   Marital status: Single    Spouse name: Not on file   Number of children: 1   Years of education: Not on file   Highest education level: Not on file  Occupational History   Occupation: unknown  Tobacco Use   Smoking status: Never   Smokeless tobacco: Never  Vaping Use   Vaping Use: Never used  Substance and Sexual Activity   Alcohol use: Not Currently   Drug use: Not Currently   Sexual activity: Not Currently  Other Topics Concern   Not on file  Social History Narrative   Patient found in hotel room; unsure of living situation prior.   Social Determinants of Health   Financial Resource Strain: Not on file  Food Insecurity: Not on file  Transportation Needs: Not on file  Physical Activity:  Not on file  Stress: Not on file  Social Connections: Not on file  Intimate Partner Violence: Not on file    Review of Systems   Gen: Denies any fever, chills, loss of appetite, change in weight or weight loss CV: Denies chest pain, heart palpitations, syncope, edema  Resp: Denies shortness of breath with rest, cough, wheezing, coughing up blood, and pleurisy. GI:  denies  hematochezia, constipation, dysphagia, odyonophagia, early satiety or weight loss. +nausea/vomiting, +diarrhea +melena +Coffee ground emesis  GU : Denies urinary burning, blood in urine, urinary frequency, and urinary incontinence. MS: Denies joint pain, limitation of movement, swelling, cramps, and atrophy.  Derm: Denies rash, itching, dry skin, hives. Psych: Denies depression, anxiety, memory  loss, hallucinations, and confusion. Heme: Denies bruising or bleeding Neuro:  Denies any headaches, dizziness, paresthesias, shaking  Physical Exam   Vital Signs in last 24 hours: Temp:  [98.5 F (36.9 C)-99.4 F (37.4 C)] 98.7 F (37.1 C) (07/03 0335) Pulse Rate:  [94-118] 110 (07/03 0335) Resp:  [16-30] 20 (07/03 0335) BP: (105-148)/(58-96) 105/58 (07/03 0335) SpO2:  [99 %-100 %] 100 % (07/03 0335) Weight:  [83 kg-84.5 kg] 84.5 kg (07/03 0558) Last BM Date : 01/16/23  General:   Alert,  Well-developed, well-nourished, pleasant and cooperative in NAD Head:  Normocephalic and atraumatic. Eyes:  Sclera clear, no icterus.   Conjunctiva pink. Ears:  Normal auditory acuity. Mouth:  No deformity or lesions, dentition normal. Neck:  Supple; no masses Lungs:  Clear throughout to auscultation.   No wheezes, crackles, or rhonchi. No acute distress. Heart:  Regular rate and rhythm; no murmurs, clicks, rubs,  or gallops. Abdomen:  Soft, and nondistended. TTP of LUQ. No masses, hepatosplenomegaly or hernias noted. Normal bowel sounds, without guarding, and without rebound.   Msk:  Symmetrical without gross deformities. Normal posture. Extremities:  Without clubbing or edema. Neurologic:  Alert and  oriented x4. Skin:  Intact without significant lesions or rashes. Psych:  Alert and cooperative. Normal mood and affect.   Labs/Studies   Recent Labs Recent Labs    01/16/23 1320 01/17/23 0410  WBC 5.6 4.7  HGB 9.1* 7.6*  HCT 28.4* 24.5*  PLT 210 180   BMET Recent Labs    01/16/23 1320 01/17/23 0410  NA 133* 135  K 3.9 3.6  CL 101 106  CO2 22 21*  GLUCOSE 105* 76  BUN 14 14  CREATININE 0.58 0.56  CALCIUM 8.9 8.3*   LFT Recent Labs    01/16/23 1320 01/17/23 0410  PROT 7.1 6.2*  ALBUMIN 3.2* 2.8*  AST 60* 45*  ALT 32 26  ALKPHOS 159* 129*  BILITOT 1.8* 1.7*    Assessment   Alyssa Carey is a 52 y.o. year old female with significant GI history, as above, who  presented to the ED with nausea, vomiting, diarrhea x 3 days.  She also endorsed dark tarry stools. FOBT positive. GI consulted for further evaluation.  Anemia/Melena: worsening anemia, down to 7.6 this morning. Iron studies in may normal with sat elevated at 40%. Denies BRBPR. Having melena and coffee ground emesis for the past few days after acute onset of n/v/d. Last EGD in 2022, no EVs at that time. Plt count >150k this admission. currently undergoing workup for possible cirrhosis as noted on recent CT imaging. She is not on ACs. No frequent NSAID use. On famotidine IV.  recommend EGD today, differentials include PUD, gastritis, duodenitis, bleeding EVs (low suspicion, plt count 210k). Indications, risks and  benefits of procedure discussed in detail with patient. Patient verbalized understanding and is in agreement to proceed with EGD today.   Nausea/vomiting/Diarrhea:acute onset Sunday with upper abdominal/periumbilical pain. Melena/coffee ground emesis as above. Reportedly some chills/low grade temp. Reporting 2-3 loose to watery stools per day initially. No BRBPR or hematemesis. Etiology unclear, Stool studies are pending. Recommending EGD, as above. Symptoms appear to be improving.   Concerns for cirrhosis: incidental findings of changes of cirrhosis noted on recent CT A/P in may. workup thus far unremarkable with negative serologies, [place] elastography ordered as outpatient but not yet completed. notably Alk phos 129, T Bili is 1.7, AST 45, though IgM and AMA in May 2024 normal. She denies current ETOH use, though did drink in the past. LFTs previously higher though have continued to trend down over the past few months. NAFLD score is 1.58, indicative of possible F3-F4 fibrosis. Will need to proceed with further evaluation/US elastography as outpatient. Continued etoh cessation.   Plan / Recommendations   Remain NPO EGD today with Dr. Jena Gauss  Can continue with Pepcid 20mg  IV for now, may transition  to PPI pending findings on EGD Follow for stool study results  5. Further workup of possible cirrhosis/elevated LFTs as outpatient  6. Trend h&h, transfuse for hgb <7   01/17/2023, 9:19 AM  Ailee Pates L. Jeanmarie Hubert, MSN, APRN, AGNP-C Adult-Gerontology Nurse Practitioner [facility] at [facility]

## 2023-01-17 NOTE — Progress Notes (Signed)
[facility] PROGRESS NOTE  Alyssa Carey (DOB: 02-14-1971) WUJ:[id number] PCP: Richardean Chimera, MD  Brief Narrative: Alyssa Carey is a 52 y.o. female with a history of necrotizing fasciitis s/p colostomy and subsequent reversal, HTN, EtOH abuse, hepatic cirrhosis who presented to the ED on 01/16/2023 with black stools and acute anemia, +FOBT. She was admitted for EGD evaluation.  Subjective: Still some black stools, consents to blood products, has had them before without issues. Mild generalized abdominal discomfort.  Objective: BP (!) 136/93   Pulse 94   Temp 98.7 F (37.1 C) (Oral)   Resp 20   Ht 4\' 9"  (1.448 m)   Wt 84.5 kg   LMP 11/30/2021   SpO2 100%   BMI 40.31 kg/m   Gen: No distress Pulm: Clear, nonlabored  CV: RRR, no MRG or edema GI: Soft, NT, ND, +BS Neuro: Alert and oriented. No new focal deficits. Ext: Warm, no deformities. Skin: No rashes, lesions or ulcers on visualized skin   Impression:       - Normal esophagus. Multiple prepyloric gastric                            ulcers and erosions. No bleeding stigmata.                           - Normal duodenal bulb and second portion of the                            duodenum.                           - No specimens collected.  Assessment & Plan: Upper GI bleed, ABLA, multiple prepyloric gastric ulcers and erosions:  - Avoid alcohol and NSAIDs - Monitor serial H/H with transfusion threshold 7g/dl or 8g/dl and symptomatic with ongoing bleeding.  - Plan to advance diet as tolerated. - Per GI: Famotidine 40 mg orally twice daily. Needs a PPI but allergic to our formulary protonix. Will plan esomeprazole or omeprazole 40 mg BID.  - Repeat EGD in 3 months for ulcer surveillance.    Diarrhea:  - C. diff and GI panel pending. Continuing enteric precautions until that results.   Alcoholic cirrhosis: Bili, LFTs stable. Hyponatremia resolved with isotonic saline. Hypoalbuminemia noted, supplement protein as  able.   Morbid obesity: Body mass index is 40.31 kg/m.   Tyrone Nine, MD [facility] [email or web address] 01/17/2023, 4:57 PM

## 2023-01-17 NOTE — Anesthesia Preprocedure Evaluation (Signed)
Anesthesia Evaluation  Patient identified by MRN, date of birth, ID band Patient awake    Reviewed: Allergy & Precautions, H&P , NPO status , Patient's Chart, lab work & pertinent test results, reviewed documented beta blocker date and time   Airway Mallampati: II  TM Distance: >3 FB Neck ROM: full    Dental no notable dental hx.    Pulmonary neg pulmonary ROS   Pulmonary exam normal breath sounds clear to auscultation       Cardiovascular Exercise Tolerance: Good hypertension, negative cardio ROS  Rhythm:regular Rate:Normal     Neuro/Psych negative neurological ROS  negative psych ROS   GI/Hepatic negative GI ROS, PUD,,,(+)     substance abuse  alcohol use, Hepatitis -  Endo/Other  negative endocrine ROS    Renal/GU Renal diseasenegative Renal ROS  negative genitourinary   Musculoskeletal   Abdominal   Peds  Hematology negative hematology ROS (+) Blood dyscrasia, anemia   Anesthesia Other Findings   Reproductive/Obstetrics negative OB ROS                             Anesthesia Physical Anesthesia Plan  ASA: 4 and emergent  Anesthesia Plan: General   Post-op Pain Management:    Induction:   PONV Risk Score and Plan: Propofol infusion  Airway Management Planned:   Additional Equipment:   Intra-op Plan:   Post-operative Plan:   Informed Consent: I have reviewed the patients History and Physical, chart, labs and discussed the procedure including the risks, benefits and alternatives for the proposed anesthesia with the patient or authorized representative who has indicated his/her understanding and acceptance.     Dental Advisory Given  Plan Discussed with: CRNA  Anesthesia Plan Comments:        Anesthesia Quick Evaluation

## 2023-01-17 NOTE — Progress Notes (Signed)
Initial Nutrition Assessment  DOCUMENTATION CODES:   Morbid obesity  INTERVENTION:  RD will follow for diet advancement    NUTRITION DIAGNOSIS:   Inadequate oral intake related to nausea, vomiting, diarrhea (dark tarry stools (EGD pending)) as evidenced by per patient/family report.   GOAL:  Patient will meet greater than or equal to 90% of their needs   MONITOR:  Diet advancement, Labs, Supplement acceptance, PO intake  REASON FOR ASSESSMENT:   Consult Assessment of nutrition requirement/status  ASSESSMENT: Patient is a 52 yo with a hx of ETOH abuse, HTN, necrotizing fascitis and presents with nausea, vomiting,and diarrhea (dark tarry stools) for 2-3 days PTA.   Patient is NPO -GI following. EGD today. She was eating as usual up to 2-3 days before admission. Home diet regular. Gradual weight gain trend over the pasts year. Question if the 87.6 kg wt may be an outlier.   Medications reviewed and include: pepcid.   Weight trend over the past year.    Wt Readings from Last 10 Encounters:  01/17/23 84.5 kg  12/05/22 87.6 kg  11/22/22 80.7 kg  11/21/22 80.7 kg  11/10/22 80.7 kg  02/09/22 75.8 kg  02/07/22 80 kg  02/02/22 81.1 kg  01/16/22 76.7 kg  01/11/22 76.2 kg        Latest Ref Rng & Units 01/17/2023    4:10 AM 01/16/2023    1:20 PM 12/05/2022    3:45 PM  BMP  Glucose 70 - 99 mg/dL 76  696  67   BUN 6 - 20 mg/dL 14  14  <5   Creatinine 0.44 - 1.00 mg/dL 2.95  2.84  1.32   Sodium 135 - 145 mmol/L 135  133  124   Potassium 3.5 - 5.1 mmol/L 3.6  3.9  4.2   Chloride 98 - 111 mmol/L 106  101  94   CO2 22 - 32 mmol/L 21  22  16    Calcium 8.9 - 10.3 mg/dL 8.3  8.9  8.3      NUTRITION - FOCUSED PHYSICAL EXAM: Deferred to follow up- she was fully clothed during RD visit  Diet Order:   Diet Order             Diet NPO time specified  Diet effective now                   EDUCATION NEEDS:  Education needs have been addressed  Skin:  Skin Assessment:  Reviewed RN Assessment  Last BM:  7/3  Height:   Ht Readings from Last 1 Encounters:  01/16/23 4\' 9"  (1.448 m)    Weight:   Wt Readings from Last 1 Encounters:  01/17/23 84.5 kg    Ideal Body Weight:   45 kg   BMI:  Body mass index is 40.31 kg/m.  Estimated Nutritional Needs:   Kcal:  1700-1900  Protein:  90 gr  Fluid:  1.7-1.9 liters daily  Royann Shivers MS,RD,CSG,LDN Contact: Loretha Stapler

## 2023-01-17 NOTE — Anesthesia Procedure Notes (Signed)
Date/Time: 01/17/2023 2:54 PM  Performed by: Julian Reil, CRNAPre-anesthesia Checklist: Patient identified, Emergency Drugs available, Suction available and Patient being monitored Patient Re-evaluated:Patient Re-evaluated prior to induction Oxygen Delivery Method: Nasal cannula Induction Type: IV induction

## 2023-01-17 NOTE — Transfer of Care (Signed)
Immediate Anesthesia Transfer of Care Note  Patient: Alyssa Carey  Procedure(s) Performed: ESOPHAGOGASTRODUODENOSCOPY (EGD) WITH PROPOFOL  Patient Location: PACU  Anesthesia Type:General  Level of Consciousness: awake and alert   Airway & Oxygen Therapy: Patient Spontanous Breathing and Patient connected to nasal cannula oxygen  Post-op Assessment: Report given to RN and Post -op Vital signs reviewed and stable  Post vital signs: Reviewed and stable  Last Vitals:  Vitals Value Taken Time  BP    Temp    Pulse    Resp    SpO2      Last Pain:  Vitals:   01/17/23 1449  TempSrc:   PainSc: 4       Patients Stated Pain Goal: (P) 5 (01/17/23 1357)  Complications: No notable events documented.

## 2023-01-17 NOTE — Op Note (Signed)
[facility] Patient Name: Alyssa Carey Procedure Date: 01/17/2023 2:34 PM MRN: [id number] Date of Birth: July 28, 1970 Attending MD: Gennette Pac , MD, [id number] CSN: [id number] Age: 52 Admit Type: Inpatient Procedure:                Upper GI endoscopy Indications:              Coffee-ground emesis, Melena; NSAID use Providers:                Gennette Pac, MD, Crystal Page, Lennice Sites                            Technician, Technician Referring MD:              Medicines:                Propofol per Anesthesia Complications:            No immediate complications. Estimated Blood Loss:     Estimated blood loss was minimal. Estimated blood                            loss: none. Procedure:                Pre-Anesthesia Assessment:                           - Prior to the procedure, a History and Physical                            was performed, and patient medications and                            allergies were reviewed. The patient's tolerance of                            previous anesthesia was also reviewed. The risks                            and benefits of the procedure and the sedation                            options and risks were discussed with the patient.                            All questions were answered, and informed consent                            was obtained. Prior Anticoagulants: The patient has                            taken no anticoagulant or antiplatelet agents. ASA                            Grade Assessment: III - A patient with severe  systemic disease. After reviewing the risks and                            benefits, the patient was deemed in satisfactory                            condition to undergo the procedure.                           After obtaining informed consent, the endoscope was                            passed under direct vision. Throughout the                            procedure,  the patient's blood pressure, pulse, and                            oxygen saturations were monitored continuously. The                            [id number]-[id number] ([id number]) scope was introduced through the                            mouth, and advanced to the second part of duodenum.                            The upper GI endoscopy was accomplished without                            difficulty. The patient tolerated the procedure                            well. Scope In: 2:55:29 PM Scope Out: 2:59:43 PM Total Procedure Duration: 0 hours 4 minutes 14 seconds  Findings:      The examined esophagus was normal. Gastric cavity empty. Patient has a       1.25 cm deep clean-based prepyloric gastric ulcer with 5 smaller       "satellite" ulcers in the same area. All without bleeding stigmata.       There were also scattered erosions which appeared innocent. Pylorus       patent and easily traversed.      The duodenal bulb and second portion of the duodenum were normal. No       biopsies taken. Impression:               - Normal esophagus. Multiple prepyloric gastric                            ulcers and erosions. No bleeding stigmata.                           - Normal duodenal bulb and second portion of the  duodenum.                           - No specimens collected. Moderate Sedation:      Moderate (conscious) sedation was personally administered by an       anesthesia professional. The following parameters were monitored: oxygen       saturation, heart rate, blood pressure, respiratory rate, EKG, adequacy       of pulmonary ventilation, and response to care. Recommendation:           - Patient has a contact number available for                            emergencies. The signs and symptoms of potential                            delayed complications were discussed with the                            patient. Return to normal activities tomorrow.                             Written discharge instructions were provided to the                            patient.                           - Resume previous diet.                           - Return patient to hospital ward for ongoing care.                           - Advance diet as tolerated.                           - Continue present medications. Lay completely off                            of NSAIDs.                           -Famotidine 40 mg orally twice daily. Needs a PPI.                            No omeprazole on formulary. This discussed with                            Jeannett Senior and pharmacy. As soon as feasible patient                            needs to be switched to either esomeprazole or                            omeprazole 40 mg orally twice daily  30 minutes                            before breakfast and supper                           - Repeat upper endoscopy in 3 months for                            surveillance.                           - Return to GI office (to see Dr. Marletta Lor) in 10                            weeks. Procedure Code(s):        --- Professional ---                           [phone], Esophagogastroduodenoscopy, flexible,                            transoral; diagnostic, including collection of                            specimen(s) by brushing or washing, when performed                            (separate procedure) Diagnosis Code(s):        --- Professional ---                           K92.0, Hematemesis                           K92.1, Melena (includes Hematochezia) CPT copyright 2022 American Medical Association. All rights reserved. The codes documented in this report are preliminary and upon coder review may  be revised to meet current compliance requirements. Gerrit Friends. Tarrell Debes, MD Gennette Pac, MD 01/17/2023 3:35:11 PM This report has been signed electronically. Number of Addenda: 0

## 2023-01-18 DIAGNOSIS — K760 Fatty (change of) liver, not elsewhere classified: Secondary | ICD-10-CM | POA: Diagnosis not present

## 2023-01-18 DIAGNOSIS — E860 Dehydration: Secondary | ICD-10-CM | POA: Diagnosis not present

## 2023-01-18 DIAGNOSIS — K257 Chronic gastric ulcer without hemorrhage or perforation: Secondary | ICD-10-CM | POA: Diagnosis not present

## 2023-01-18 DIAGNOSIS — Z888 Allergy status to other drugs, medicaments and biological substances status: Secondary | ICD-10-CM | POA: Diagnosis not present

## 2023-01-18 DIAGNOSIS — Z6841 Body Mass Index (BMI) 40.0 and over, adult: Secondary | ICD-10-CM | POA: Diagnosis not present

## 2023-01-18 DIAGNOSIS — R112 Nausea with vomiting, unspecified: Secondary | ICD-10-CM | POA: Diagnosis not present

## 2023-01-18 DIAGNOSIS — R7989 Other specified abnormal findings of blood chemistry: Secondary | ICD-10-CM | POA: Diagnosis not present

## 2023-01-18 DIAGNOSIS — K921 Melena: Secondary | ICD-10-CM | POA: Diagnosis not present

## 2023-01-18 DIAGNOSIS — F1010 Alcohol abuse, uncomplicated: Secondary | ICD-10-CM | POA: Diagnosis not present

## 2023-01-18 DIAGNOSIS — R197 Diarrhea, unspecified: Secondary | ICD-10-CM | POA: Diagnosis not present

## 2023-01-18 DIAGNOSIS — E871 Hypo-osmolality and hyponatremia: Secondary | ICD-10-CM | POA: Diagnosis not present

## 2023-01-18 DIAGNOSIS — R300 Dysuria: Secondary | ICD-10-CM | POA: Diagnosis not present

## 2023-01-18 DIAGNOSIS — D649 Anemia, unspecified: Secondary | ICD-10-CM | POA: Diagnosis not present

## 2023-01-18 DIAGNOSIS — B3731 Acute candidiasis of vulva and vagina: Secondary | ICD-10-CM | POA: Diagnosis not present

## 2023-01-18 DIAGNOSIS — Z885 Allergy status to narcotic agent status: Secondary | ICD-10-CM | POA: Diagnosis not present

## 2023-01-18 DIAGNOSIS — K7030 Alcoholic cirrhosis of liver without ascites: Secondary | ICD-10-CM | POA: Diagnosis not present

## 2023-01-18 DIAGNOSIS — I10 Essential (primary) hypertension: Secondary | ICD-10-CM | POA: Diagnosis not present

## 2023-01-18 DIAGNOSIS — D62 Acute posthemorrhagic anemia: Secondary | ICD-10-CM | POA: Diagnosis not present

## 2023-01-18 DIAGNOSIS — E8809 Other disorders of plasma-protein metabolism, not elsewhere classified: Secondary | ICD-10-CM | POA: Diagnosis not present

## 2023-01-18 DIAGNOSIS — Z79899 Other long term (current) drug therapy: Secondary | ICD-10-CM | POA: Diagnosis not present

## 2023-01-18 DIAGNOSIS — Z880 Allergy status to penicillin: Secondary | ICD-10-CM | POA: Diagnosis not present

## 2023-01-18 LAB — CBC
HCT: 24.5 % — ABNORMAL LOW (ref 36.0–46.0)
Hemoglobin: 7.5 g/dL — ABNORMAL LOW (ref 12.0–15.0)
MCH: 26.4 pg (ref 26.0–34.0)
MCHC: 30.6 g/dL (ref 30.0–36.0)
MCV: 86.3 fL (ref 80.0–100.0)
Platelets: 163 10*3/uL (ref 150–400)
RBC: 2.84 MIL/uL — ABNORMAL LOW (ref 3.87–5.11)
RDW: 16.5 % — ABNORMAL HIGH (ref 11.5–15.5)
WBC: 3.7 10*3/uL — ABNORMAL LOW (ref 4.0–10.5)
nRBC: 0 % (ref 0.0–0.2)

## 2023-01-18 LAB — TYPE AND SCREEN: Unit division: 0

## 2023-01-18 LAB — URINALYSIS, ROUTINE W REFLEX MICROSCOPIC
Bilirubin Urine: NEGATIVE
Glucose, UA: NEGATIVE mg/dL
Hgb urine dipstick: NEGATIVE
Ketones, ur: NEGATIVE mg/dL
Leukocytes,Ua: NEGATIVE
Nitrite: NEGATIVE
Protein, ur: 30 mg/dL — AB
Specific Gravity, Urine: 1.027 (ref 1.005–1.030)
pH: 5 (ref 5.0–8.0)

## 2023-01-18 LAB — PREPARE RBC (CROSSMATCH)

## 2023-01-18 MED ORDER — SODIUM CHLORIDE 0.9% IV SOLUTION: Freq: Once | INTRAVENOUS | Status: AC

## 2023-01-18 MED ORDER — FAMOTIDINE 20 MG PO TABS
40.0000 mg | ORAL_TABLET | Freq: Two times a day (BID) | ORAL | Status: DC
  Administered 2023-01-18 – 2023-01-19 (×2): 40 mg via ORAL
  Filled 2023-01-18 (×2): qty 2

## 2023-01-18 MED ORDER — FAMOTIDINE IN NACL 20-0.9 MG/50ML-% IV SOLN
20.0000 mg | Freq: Once | INTRAVENOUS | Status: AC
  Administered 2023-01-18: 20 mg via INTRAVENOUS
  Filled 2023-01-18: qty 50

## 2023-01-18 NOTE — Progress Notes (Signed)
[facility] PROGRESS NOTE  Alyssa Carey (DOB: 1970-08-02) ZOX:[id number] PCP: Richardean Chimera, MD  Brief Narrative: Alyssa Carey is a 52 y.o. female with a history of necrotizing fasciitis s/p colostomy and subsequent reversal, HTN, EtOH abuse, hepatic cirrhosis who presented to the ED on 01/16/2023 with black stools and acute anemia, +FOBT. She was admitted for EGD evaluation. 1u RBCs for symptomatic ABLA on 7/4.  Subjective: Still dark stools as of 3am last night, tolerating diet, having vaginal itching and burning with urination. Feels lightheaded when standing.  Objective: BP 126/71 (BP Location: Left Arm)   Pulse (!) 103   Temp 98.7 F (37.1 C)   Resp 18   Ht 4\' 9"  (1.448 m)   Wt 85.7 kg   LMP 11/30/2021   SpO2 95%   BMI 40.89 kg/m   Gen: No distress Pulm: Clear, nonlabored  CV: Regular tachycardia without MRG, trace edema GI: Soft, NT, ND, +BS  Neuro: Alert and oriented.. No new focal deficits. Ext: Warm, no deformities. Skin: Dry cracked and callused feet without infection or bleeding. No other rashes, lesions or ulcers on visualized skin   Impression:       - Normal esophagus. Multiple prepyloric gastric                            ulcers and erosions. No bleeding stigmata.                           - Normal duodenal bulb and second portion of the                            duodenum.                           - No specimens collected.  Assessment & Plan: Upper GI bleed, ABLA, multiple prepyloric gastric ulcers and erosions:  - Avoid alcohol and NSAIDs - Symptomatic anemia with Hgb back down to 7.5g/dl and pt tachycardic, lightheaded with standing. Will give 1u RBCs and monitor H/H.  - Advance diet as tolerated. - Per GI: Famotidine 40 mg orally twice daily. Needs a PPI but allergic to our formulary protonix. Will plan esomeprazole or omeprazole 40 mg BID.  - Repeat EGD in 3 months for ulcer surveillance.    Diarrhea:  - C. diff and GI panel not collected,  stool frequency, stable abd exam, no leukocytosis or fever. Will DC orders and precautions.    Alcoholic cirrhosis: Bili, LFTs stable. Hyponatremia resolved with isotonic saline. Hypoalbuminemia noted, supplement protein as able.   Vulvovaginal candidiasis: Based on symptoms, given diflucan x1 7/3.   Dysuria:  - Check UA  Morbid obesity: Body mass index is 40.89 kg/m.   Tyrone Nine, MD [facility] [email or web address] 01/18/2023, 12:36 PM

## 2023-01-18 NOTE — Progress Notes (Signed)
Patient verbalized some complaints of pain during shift given PRN, see MAR. Ambulates independent in room. Patient reported some discomfort with urination, UA obtained. Tolerated blood transfusion with no adverse effects at this time.

## 2023-01-18 NOTE — Progress Notes (Signed)
patient without complaints.  Tolerating diet. Melena has tapered off. Hemoglobin 7.5 this morning;  1 unit packed RBCs ordered  Vital signs in last 24 hours: Temp:  [97.9 F (36.6 C)-98.7 F (37.1 C)] 97.9 F (36.6 C) (07/04 1256) Pulse Rate:  [56-103] 96 (07/04 1256) Resp:  [18-34] 18 (07/04 1256) BP: (94-149)/(55-102) 133/85 (07/04 1256) SpO2:  [86 %-100 %] 100 % (07/04 1256) Weight:  [85.7 kg] 85.7 kg (07/04 0500) Last BM Date : 01/18/23 General:    alert, conversant sitting on the side of her bed -  pleasant and cooperative in NAD Abdomen:   nondistended.  Positive bowel sounds soft and nontender   Intake/Output from previous day: 07/03 0701 - 07/04 0700 In: 780 [P.O.:480; I.V.:200; IV Piggyback:100] Out: -  Intake/Output this shift: Total I/O In: 240 [P.O.:240] Out: -   Lab Results: Recent Labs    01/16/23 1320 01/17/23 0410 01/17/23 1438 01/18/23 0403  WBC 5.6 4.7  --  3.7*  HGB 9.1* 7.6* 8.7* 7.5*  HCT 28.4* 24.5* 28.7* 24.5*  PLT 210 180  --  163   BMET Recent Labs    01/16/23 1320 01/17/23 0410  NA 133* 135  K 3.9 3.6  CL 101 106  CO2 22 21*  GLUCOSE 105* 76  BUN 14 14  CREATININE 0.58 0.56  CALCIUM 8.9 8.3*   LFT Recent Labs    01/17/23 0410  PROT 6.2*  ALBUMIN 2.8*  AST 45*  ALT 26  ALKPHOS 129*  BILITOT 1.7*     Impression: Pleasant 52 year old lady presenting with nausea vomiting diarrhea for 3 days.  Reported melena.  Significant drop in hemoglobin.  EGD yesterday demonstrated bulb prepyloric ulcers-multiple.    NSAID induced most likely.  Low risk stigmata.  Did not require treatment.    H. pylori negative by serology on prior biopsies.    Hemoglobin stabilized at 7.5.  Clinically, bleeding has tapered off.  1 unit of packed RBCs ordered.  Elevated LFTs-nonspecific.  Question of cirrhosis on prior imaging by morphology.  Advanced fibrosis suggested by serological studies  through Dr. Queen Blossom office.    Good to see no stigmata of  portal hypertension on recent EGD.  Recommendations:  - Agree with 1 unit transfusion.  - Twice daily PPI therapy x 12 weeks; no more NSAIDs  -  follow-up with Dr. Marletta Lor via liver issues; will need to repeat EGD in 12 weeks  -   from a GI standpoint can be discharged after transfusion.

## 2023-01-19 ENCOUNTER — Telehealth: Payer: Self-pay | Admitting: Gastroenterology

## 2023-01-19 ENCOUNTER — Encounter (HOSPITAL_COMMUNITY): Payer: Self-pay | Admitting: Internal Medicine

## 2023-01-19 DIAGNOSIS — D649 Anemia, unspecified: Secondary | ICD-10-CM | POA: Diagnosis not present

## 2023-01-19 DIAGNOSIS — K921 Melena: Secondary | ICD-10-CM | POA: Diagnosis not present

## 2023-01-19 DIAGNOSIS — R112 Nausea with vomiting, unspecified: Secondary | ICD-10-CM | POA: Diagnosis not present

## 2023-01-19 LAB — HEMOGLOBIN AND HEMATOCRIT, BLOOD
HCT: 27.2 % — ABNORMAL LOW (ref 36.0–46.0)
Hemoglobin: 8.6 g/dL — ABNORMAL LOW (ref 12.0–15.0)

## 2023-01-19 LAB — BPAM RBC
Blood Product Expiration Date: 202407232359
ISSUE DATE / TIME: 202407041413
Unit Type and Rh: 6200

## 2023-01-19 LAB — TYPE AND SCREEN: ABO/RH(D): A POS

## 2023-01-19 MED ORDER — OMEPRAZOLE 40 MG PO CPDR: 40.0000 mg | DELAYED_RELEASE_CAPSULE | Freq: Two times a day (BID) | ORAL | 2 refills | Status: DC

## 2023-01-19 MED ORDER — FLUCONAZOLE 150 MG PO TABS: 150.0000 mg | ORAL_TABLET | Freq: Once | ORAL | 0 refills | Status: AC

## 2023-01-19 NOTE — Progress Notes (Signed)
Gastroenterology Progress Note   Referring Provider: No ref. provider found Primary Care Physician:  Richardean Chimera, MD Primary Gastroenterologist:  Dr. Marletta Lor  Patient ID: Alyssa Carey; [id number]; 1970/11/11    Subjective   She denies any abdominal pain, nausea, vomiting, or diarrhea. + flatus.  She reports her appetite is okay although did not like her breakfast options this morning therefore did not eat much.  She denies any lightheadedness or dizziness.  She did state her praises and her care this hospitalization.   Objective   Vital signs in last 24 hours Temp:  [97.9 F (36.6 C)-98.6 F (37 C)] 98.3 F (36.8 C) (07/05 0933) Pulse Rate:  [86-104] 93 (07/05 0933) Resp:  [18-20] 18 (07/05 0933) BP: (98-145)/(63-106) 143/84 (07/05 0933) SpO2:  [95 %-100 %] 100 % (07/05 0933) Weight:  [86.5 kg] 86.5 kg (07/05 0500) Last BM Date : 01/18/23  Physical Exam General:   Alert and oriented, pleasant. Sitting on side of the bed Head:  Normocephalic and atraumatic. Eyes:  No icterus, sclera clear. Conjuctiva pink. .  Neck:  Supple, without thyromegaly or masses.  Heart:  S1, S2 present, no murmurs noted.  Lungs: Clear to auscultation bilaterally, without wheezing, rales, or rhonchi.  Abdomen:  Bowel sounds present, soft, non-tender, non-distended. No HSM or hernias noted. No rebound or guarding. No masses appreciated Extremities:  Without clubbing or edema. Neurologic:  Alert and  oriented x4;  grossly normal neurologically. Skin:  Warm and dry, intact without significant lesions.  Psych:  Alert and cooperative. Normal mood and affect.  Intake/Output from previous day: 07/04 0701 - 07/05 0700 In: 831.3 [P.O.:480; I.V.:11.3; Blood:340] Out: -  Intake/Output this shift: No intake/output data recorded.  Lab Results  Recent Labs    01/16/23 1320 01/17/23 0410 01/17/23 1438 01/18/23 0403 01/19/23 0723  WBC 5.6 4.7  --  3.7*  --   HGB 9.1* 7.6* 8.7* 7.5* 8.6*  HCT  28.4* 24.5* 28.7* 24.5* 27.2*  PLT 210 180  --  163  --    BMET Recent Labs    01/16/23 1320 01/17/23 0410  NA 133* 135  K 3.9 3.6  CL 101 106  CO2 22 21*  GLUCOSE 105* 76  BUN 14 14  CREATININE 0.58 0.56  CALCIUM 8.9 8.3*   LFT Recent Labs    01/16/23 1320 01/17/23 0410  PROT 7.1 6.2*  ALBUMIN 3.2* 2.8*  AST 60* 45*  ALT 32 26  ALKPHOS 159* 129*  BILITOT 1.8* 1.7*   PT/INR No results for input(s): "LABPROT", "INR" in the last 72 hours. Hepatitis Panel No results for input(s): "HEPBSAG", "HCVAB", "HEPAIGM", "HEPBIGM" in the last 72 hours. C-Diff PCR Not collected  Studies/Results No results found.  Assessment  52 y.o. female with a history of necrotizing fasciitis s/p diverting colostomy in June 2021 complicated by parastomal hernia, SBO in early 2023, and previous GI bleed secondary to peptic ulcer disease.  She also underwent laparoscopic colostomy takedown with partial colectomy in July 2023 and notes a history of previous alcoholic hepatitis who presented this admission with nausea, vomiting, and diarrhea for 3 days as well as melena.  Anemia/melena: Hgb has stabilized. She empirically received an additional unit of PRBC yesterday with improvement in her hemoglobin from 7.5 to 8.6 this morning.  No further reports of melena.  EGD completed 01/17/2023 with multiple prepyloric ulcers/erosions, likely NSAID induced.  Pathology negative for H. pylori.  She reports understanding the need to continue to avoid NSAIDs  and alcohol use.  Nausea/vomiting/diarrhea: Acute onset on Sunday 6/30 with epigastric/periumbilical pain, melena/coffee-ground emesis.  She also reported a possible low-grade temperature and was having 2-3 loose/watery stools per day initially.  She denied any hematemesis or BRBPR.  Symptoms likely secondary to slow upper GI bleed, unable to rule out infectious etiology.  Concerns for cirrhosis/elevated LFTs: CT in May with incidental findings of concern for  cirrhosis.  Has been undergoing outpatient workup which is thus far been unremarkable.  Ultrasound elastography has not yet been completed.  IgM and AMA normal in May 2024 with her LFTs at that time noting a slightly elevated alkaline phosphatase to 129, T. bili 1.7, and AST 45.  She does have a history of prior alcohol use but none recently.  NAFLD score 1.58 which is indicative of possible F3/F4 fibrosis and needs to complete her elastography as outpatient for further evaluation.  Plan / Recommendations  PPI twice daily for 12 weeks (manage omeprazole or esomeprazole given allergy to Protonix) Soft/regular diet Recommend repeat EGD in 12 weeks Continue to avoid NSAIDs and alcohol use Continue evaluation for elevated LFTs/concern for cirrhosis as outpatient Hospital follow-up visit in 3-4 weeks    LOS: 1 day   01/19/2023, 12:21 PM   Brooke Bonito, MSN, FNP-BC, AGACNP-BC [facility]

## 2023-01-19 NOTE — Anesthesia Postprocedure Evaluation (Signed)
Anesthesia Post Note  Patient: Alyssa Carey  Procedure(s) Performed: ESOPHAGOGASTRODUODENOSCOPY (EGD) WITH PROPOFOL  Patient location during evaluation: Phase II Anesthesia Type: General Level of consciousness: awake Pain management: pain level controlled Vital Signs Assessment: post-procedure vital signs reviewed and stable Respiratory status: spontaneous breathing and respiratory function stable Cardiovascular status: blood pressure returned to baseline and stable Postop Assessment: no headache and no apparent nausea or vomiting Anesthetic complications: no Comments: Late entry   No notable events documented.   Last Vitals:  Vitals:   01/19/23 0933 01/19/23 1256  BP: (!) 143/84 136/86  Pulse: 93 96  Resp: 18 18  Temp: 36.8 C 36.9 C  SpO2: 100% 100%    Last Pain:  Vitals:   01/19/23 0933  TempSrc: Oral  PainSc: 0-No pain                 Windell Norfolk

## 2023-01-19 NOTE — Telephone Encounter (Signed)
Please arrange hospital follow-up visit for this patient with Tobi Bastos or Dr. Marletta Lor in 3-4 weeks.  Please also arrange for her to have CBC in 1 week at [facility]. (I have already discussed this with the patient made her aware that she should go Thursday or Friday next week to have this performed.)  Brooke Bonito, MSN, APRN, FNP-BC, AGACNP-BC [facility] at [facility]

## 2023-01-19 NOTE — Discharge Summary (Signed)
Physician Discharge Summary   Patient: Alyssa Carey MRN: [id number] DOB: [birth year]  Admit date:     01/16/2023  Discharge date: 01/19/23  Discharge Physician: Tyrone Nine   PCP: Richardean Chimera, MD   Recommendations at discharge:  Follow up with GI, follow up repeat CBC and CMP regularly.   Discharge Diagnoses: Principal Problem:   Blood in stool Active Problems:   Acute GI bleeding   Hyponatremia   Nausea & vomiting   Abnormal liver function   Protein calorie malnutrition [facility])  Hospital Course: Alyssa Carey is a 52 y.o. female with a history of necrotizing fasciitis s/p colostomy and subsequent reversal, HTN, EtOH abuse, hepatic cirrhosis who presented to the ED on 01/16/2023 with black stools and acute anemia, +FOBT. She was admitted for EGD evaluation, performed 7/3 showing multiple prepyloric gastric ulcers and erosions. 1u RBCs given 7/4 for symptomatic ABLA with improvement in hgb and symptoms. No further bleeding evident.   Assessment and Plan: Upper GI bleed, ABLA, multiple prepyloric gastric ulcers and erosions:  - Avoid alcohol and NSAIDs (DC ibuprofen) - Symptomatic anemia with Hgb back down to 7.5g/dl and pt tachycardic, lightheaded with standing. Given 1u RBCs 7/4 with resolution of symptoms and appropriate rise of hgb to 8.6g/dl. No further clinical signs of bleeding, stable for discharge per GI and medical teams. and monitor H/H.  - PPI BID x12 weeks per GI - Follow up to be arranged through [facility].  - Needs a PPI but allergic to our formulary protonix. Will plan esomeprazole or omeprazole 40 mg BID.  - Repeat EGD in 3 months for ulcer surveillance.     Diarrhea: Resolved. - C. diff and GI panel not collected, stool frequency, stable abd exam, no leukocytosis or fever.     Alcoholic cirrhosis: Bili, LFTs stable. Hyponatremia resolved with isotonic saline. Hypoalbuminemia noted, supplement protein as able.    Vulvovaginal candidiasis: Based on  symptoms, given diflucan x1 7/3. Repeat 7/6.   Dysuria: UA clean, symptoms improving with diflucan.    Morbid obesity: Body mass index is 40.89 kg/m.   Consultants: GI Procedures performed: EGD 7/3 Dr. Jena Gauss: Impression:       - Normal esophagus. Multiple prepyloric gastric                            ulcers and erosions. No bleeding stigmata.                           - Normal duodenal bulb and second portion of the                            duodenum.                           - No specimens collected. Disposition: Home Diet recommendation:  Cardiac diet DISCHARGE MEDICATION: Allergies as of 01/19/2023       Reactions   Pantoprazole    Stomach pain   Penicillins Hives   Did it involve swelling of the face/tongue/throat, SOB, or low BP? N Did it involve sudden or severe rash/hives, skin peeling, or any reaction on the inside of your mouth or nose? Y Did you need to seek medical attention at a hospital or doctor's office? N When did it last happen?  Childhood  If all above answers are "NO", may proceed with cephalosporin use.   Oxycodone Hcl Rash        Medication List     STOP taking these medications    cephALEXin 500 MG capsule Commonly known as: KEFLEX   ibuprofen 600 MG tablet Commonly known as: ADVIL   phenazopyridine 200 MG tablet Commonly known as: PYRIDIUM       TAKE these medications    ascorbic acid 500 MG tablet Commonly known as: VITAMIN C Take 500 mg by mouth daily.   cholecalciferol 25 MCG (1000 UNIT) tablet Commonly known as: VITAMIN D3 Take 1,000 Units by mouth daily.   famotidine 20 MG tablet Commonly known as: PEPCID Take 20 mg by mouth daily.   fluconazole 150 MG tablet Commonly known as: Diflucan Take 1 tablet (150 mg total) by mouth once for 1 dose. Start taking on: January 20, 2023   fluticasone 50 MCG/ACT nasal spray Commonly known as: FLONASE Place 2 sprays into both nostrils daily.   gabapentin 100 MG capsule Commonly  known as: NEURONTIN Take 1 capsule (100 mg total) by mouth at bedtime as needed (pain).   Gerhardt's butt cream Crea Apply 1 Application topically 4 (four) times daily as needed for irritation.   hydrocortisone 2.5 % rectal cream Commonly known as: ANUSOL-HC Place 1 Application rectally 2 (two) times daily.   hydrOXYzine 25 MG tablet Commonly known as: ATARAX Take 25 mg by mouth as needed for itching.   Ipratropium-Albuterol 20-100 MCG/ACT Aers respimat Commonly known as: COMBIVENT Inhale 1 puff into the lungs every 6 (six) hours as needed for wheezing or shortness of breath.   loratadine 10 MG tablet Commonly known as: CLARITIN Take 1 tablet (10 mg total) by mouth daily.   montelukast 10 MG tablet Commonly known as: SINGULAIR Take 10 mg by mouth at bedtime.   omeprazole 40 MG capsule Commonly known as: PRILOSEC Take 1 capsule (40 mg total) by mouth 2 (two) times daily.        Follow-up Information     Richardean Chimera, MD Follow up.   Specialty: Family Medicine Contact information: [address] [place] [address] [phone]         [facility]. Schedule an appointment as soon as possible for a visit.   Contact information: [address] [place] [address] [phone]               Discharge Exam: Filed Weights   01/17/23 0558 01/18/23 0500 01/19/23 0500  Weight: 84.5 kg 85.7 kg 86.5 kg  BP (!) 143/84 (BP Location: Left Arm)   Pulse 93   Temp 98.3 F (36.8 C) (Oral)   Resp 18   Ht 4\' 9"  (1.448 m)   Wt 86.5 kg   LMP 11/30/2021   SpO2 100%   BMI 41.27 kg/m   Well-appearing female in no distress. No more lightheadedness, tolerated transfusion Clear, nonlabored RRR, no MRG Soft, NT, ND, no masses, +BS  Condition at discharge: stable  The results of significant diagnostics from this hospitalization (including imaging, microbiology, ancillary and laboratory) are listed below for reference.    Imaging Studies: No results found.  Microbiology: Results for orders placed or performed during the hospital encounter of 11/22/22  Urine Culture     Status: Abnormal   Collection Time: 11/22/22 12:45 AM   Specimen: Urine, Random  Result Value Ref Range Status   Specimen Description   Final    URINE, RANDOM Performed  at [facility], [address] [place], [place] [address]    Special Requests   Final    NONE Reflexed from [id number] Performed at [facility], [address], [place], [place] [address]    Culture MULTIPLE SPECIES PRESENT, SUGGEST RECOLLECTION (A)  Final   Report Status 11/23/2022 FINAL  Final    Labs: CBC: Recent Labs  Lab 01/16/23 1320 01/17/23 0410 01/17/23 1438 01/18/23 0403 01/19/23 0723  WBC 5.6 4.7  --  3.7*  --   HGB 9.1* 7.6* 8.7* 7.5* 8.6*  HCT 28.4* 24.5* 28.7* 24.5* 27.2*  MCV 82.8 86.0  --  86.3  --   PLT 210 180  --  163  --    Basic Metabolic Panel: Recent Labs  Lab 01/16/23 1320 01/17/23 0410  NA 133* 135  K 3.9 3.6  CL 101 106  CO2 22 21*  GLUCOSE 105* 76  BUN 14 14  CREATININE 0.58 0.56  CALCIUM 8.9 8.3*   Liver Function Tests: Recent Labs  Lab 01/16/23 1320 01/17/23 0410  AST 60* 45*  ALT 32 26  ALKPHOS 159* 129*  BILITOT 1.8* 1.7*  PROT 7.1 6.2*  ALBUMIN 3.2* 2.8*   CBG: No results for input(s): "GLUCAP" in the last 168 hours.  Discharge time spent: greater than 30 minutes.  Signed: Tyrone Nine, MD [facility] 01/19/2023

## 2023-01-19 NOTE — Progress Notes (Deleted)
  January 19, 2023  Patient: Alyssa Carey  Date of Birth: 05/31/1971  Date of Visit: 01/16/2023    To Whom It May Concern:  Silvanna Prickett was seen and treated in our hospital on 01/16/2023 until 01/19/2023. HALO CARDINALI  may return to work on 01/22/2023 .  Sincerely,  Enid Derry RN

## 2023-01-22 ENCOUNTER — Other Ambulatory Visit: Payer: Self-pay | Admitting: *Deleted

## 2023-01-22 DIAGNOSIS — D649 Anemia, unspecified: Secondary | ICD-10-CM

## 2023-01-22 NOTE — Telephone Encounter (Signed)
Noted. Labs entered into Epic  °

## 2023-01-26 DIAGNOSIS — S92355D Nondisplaced fracture of fifth metatarsal bone, left foot, subsequent encounter for fracture with routine healing: Secondary | ICD-10-CM | POA: Diagnosis not present

## 2023-02-12 DIAGNOSIS — R030 Elevated blood-pressure reading, without diagnosis of hypertension: Secondary | ICD-10-CM | POA: Diagnosis not present

## 2023-02-12 DIAGNOSIS — R350 Frequency of micturition: Secondary | ICD-10-CM | POA: Diagnosis not present

## 2023-02-12 DIAGNOSIS — R300 Dysuria: Secondary | ICD-10-CM | POA: Diagnosis not present

## 2023-02-12 DIAGNOSIS — Z6836 Body mass index (BMI) 36.0-36.9, adult: Secondary | ICD-10-CM | POA: Diagnosis not present

## 2023-02-14 ENCOUNTER — Other Ambulatory Visit: Payer: Self-pay | Admitting: *Deleted

## 2023-02-14 ENCOUNTER — Encounter: Payer: Self-pay | Admitting: Internal Medicine

## 2023-02-14 ENCOUNTER — Telehealth: Payer: Self-pay | Admitting: *Deleted

## 2023-02-14 ENCOUNTER — Ambulatory Visit (INDEPENDENT_AMBULATORY_CARE_PROVIDER_SITE_OTHER): Payer: 59 | Admitting: Internal Medicine

## 2023-02-14 VITALS — BP 90/64 | HR 84 | Temp 99.1°F | Ht <= 58 in | Wt 186.3 lb

## 2023-02-14 DIAGNOSIS — K279 Peptic ulcer, site unspecified, unspecified as acute or chronic, without hemorrhage or perforation: Secondary | ICD-10-CM | POA: Diagnosis not present

## 2023-02-14 DIAGNOSIS — K709 Alcoholic liver disease, unspecified: Secondary | ICD-10-CM

## 2023-02-14 DIAGNOSIS — K529 Noninfective gastroenteritis and colitis, unspecified: Secondary | ICD-10-CM | POA: Diagnosis not present

## 2023-02-14 DIAGNOSIS — S31809A Unspecified open wound of unspecified buttock, initial encounter: Secondary | ICD-10-CM

## 2023-02-14 DIAGNOSIS — D649 Anemia, unspecified: Secondary | ICD-10-CM | POA: Diagnosis not present

## 2023-02-14 NOTE — Patient Instructions (Signed)
I will check the status of your referral to the wound clinic.  We need to reschedule your ultrasound of your liver.  We will check on this as well.  Would order blood work at [facility] lab to check your blood counts.  We will call with results.  Follow-up in 6 weeks.  It was very nice seeing you again today.  Dr. Marletta Lor

## 2023-02-14 NOTE — Progress Notes (Signed)
Referring Provider: Richardean Chimera, MD Primary Care Physician:  Richardean Chimera, MD Primary GI:  Dr. Marletta Lor  Chief Complaint  Patient presents with   Anemia    Hospital follow up on anemia. Pt states she has not had time to do repeat cbc that was ordered.    Diarrhea    Follow up on diarrhea. Having 4 -5 episodes of diarrhea per day.    Wound Check    Patient states she never heard back about referral to wound clinic for wound on buttock.     HPI:   Alyssa Carey is a 52 y.o. female who presents to clinic today for follow-up visit.  She has a history of necrotizing fasciitis s/p diverting colostomy June 2021 complicated by parastomal hernia, small bowel obstruction January/February 2023, previous GI bleed due to peptic ulcer disease, July 2023 she underwent lap assisted colostomy takedown and partial colectomy by Dr. Maisie Fus.   Admitted to [facility] 01/16/23 after presenting with melena, acute blood loss anemia.  Upper endoscopy showed prepyloric ulcer clean-based, treated conservatively.  Discharged home on PPI twice daily.  History of chronically loose stools since her colostomy takedown.  States she was given cholestyramine for this which did help though she stopped taking it as it tasted bad.  Now having 6-7 loose stools a day.  History of elevated LFTs with concern for cirrhosis noted on CT A/P in May. workup thus far unremarkable with negative serologies, [place] elastography ordered but not yet completed. notably Alk phos 129, T Bili is 1.7, AST 45, though IgM and AMA in May 2024 normal. She denies current ETOH use, though did drink in the past. LFTs previously higher though have continued to trend down over the past few months. NAFLD score is 1.58, indicative of possible F3-F4 fibrosis.   Today, states she is doing okay.  Feels fatigued.  No melena hematochezia.  Occasional abdominal pain.  Taking omeprazole twice daily.  Past Medical History:  Diagnosis Date   Alcohol  abuse    HTN (hypertension) 10/05/2020   Necrotizing fasciitis [facility])     Past Surgical History:  Procedure Laterality Date   BIOPSY  10/07/2020   Procedure: BIOPSY;  Surgeon: Corbin Ade, MD;  [facility];  Service: Endoscopy;;   COLONOSCOPY WITH PROPOFOL N/A 10/07/2020   single healing rectal ulcer in distal rectum with surrounding mucosal friability and markedly abnormal proximal colon with ulceration s/p biopsies.   COLONOSCOPY WITH PROPOFOL  02/22/2021   Procedure: COLONOSCOPY WITH PROPOFOL;  Surgeon: Lanelle Bal, DO;  [facility];  Service: Endoscopy;;   COLOSTOMY TAKEDOWN N/A 01/26/2022   Procedure: LAPAROSCOPIC COLOSTOMY TAKEDOWN;  Surgeon: Romie Levee, MD;  Location: [facility];  Service: General;  Laterality: N/A;  laparoscopic assisted colostomy reversal and partial colectomy   ESOPHAGOGASTRODUODENOSCOPY (EGD) WITH PROPOFOL N/A 10/07/2020   non-bleeding gastric ulcer . Pathology with H.pylori negative   ESOPHAGOGASTRODUODENOSCOPY (EGD) WITH PROPOFOL N/A 12/27/2020   Procedure: ESOPHAGOGASTRODUODENOSCOPY (EGD) WITH PROPOFOL;  Surgeon: Lanelle Bal, DO;  [facility];  Service: Endoscopy;  Laterality: N/A;  1:00pm   ESOPHAGOGASTRODUODENOSCOPY (EGD) WITH PROPOFOL N/A 01/17/2023   Procedure: ESOPHAGOGASTRODUODENOSCOPY (EGD) WITH PROPOFOL;  Surgeon: Corbin Ade, MD;  [facility];  Service: Endoscopy;  Laterality: N/A;   FLEXIBLE SIGMOIDOSCOPY N/A 01/11/2020   Procedure: FLEXIBLE SIGMOIDOSCOPY;  Surgeon: Jeani Hawking, MD;  Location: [facility];  Service: Gastroenterology;  Laterality: N/A;   INCISION AND DRAINAGE  PERIRECTAL ABSCESS N/A 12/25/2019   Procedure: IRRIGATION AND DEBRIDEMENT BUTTOCKS, LAP LOOP COLOSTOMY;  Surgeon: Gaynelle Adu, MD;  Location: [facility];  Service: General;  Laterality: N/A;   IRRIGATION AND DEBRIDEMENT ABSCESS N/A 12/23/2019   Procedure: EXCISION AND DEBRIDEMENT LEFT BUTTOCK AND PERINEUM;  Surgeon:  Berna Bue, MD;  Location: [facility];  Service: General;  Laterality: N/A;   LAPAROSCOPIC LOOP COLOSTOMY N/A 12/25/2019   Procedure: LAPAROSCOPIC LOOP COLOSTOMY;  Surgeon: Gaynelle Adu, MD;  Location: [facility];  Service: General;  Laterality: N/A;   TONSILLECTOMY      Current Outpatient Medications  Medication Sig Dispense Refill   cholecalciferol (VITAMIN D3) 25 MCG (1000 UNIT) tablet Take 1,000 Units by mouth daily.     famotidine (PEPCID) 20 MG tablet Take 20 mg by mouth daily.     fluticasone (FLONASE) 50 MCG/ACT nasal spray Place 2 sprays into both nostrils daily.     gabapentin (NEURONTIN) 100 MG capsule Take 1 capsule (100 mg total) by mouth at bedtime as needed (pain).     hydrocortisone (ANUSOL-HC) 2.5 % rectal cream Place 1 Application rectally 2 (two) times daily. 30 g 1   hydrOXYzine (ATARAX) 25 MG tablet Take 25 mg by mouth as needed for itching.     Ipratropium-Albuterol (COMBIVENT) 20-100 MCG/ACT AERS respimat Inhale 1 puff into the lungs every 6 (six) hours as needed for wheezing or shortness of breath. 4 g 2   loratadine (CLARITIN) 10 MG tablet Take 1 tablet (10 mg total) by mouth daily. 30 tablet 1   montelukast (SINGULAIR) 10 MG tablet Take 10 mg by mouth at bedtime.     Nystatin (GERHARDT'S BUTT CREAM) CREA Apply 1 Application topically 4 (four) times daily as needed for irritation.     omeprazole (PRILOSEC) 40 MG capsule Take 1 capsule (40 mg total) by mouth 2 (two) times daily. 60 capsule 2   vitamin C (ASCORBIC ACID) 500 MG tablet Take 500 mg by mouth daily.     No current facility-administered medications for this visit.    Allergies as of 02/14/2023 - Review Complete 01/17/2023  Allergen Reaction Noted   Pantoprazole  02/17/2021   Penicillins Hives 12/21/2019   Oxycodone hcl Rash 10/06/2020    Family History  Problem Relation Age of Onset   Colon polyps Mother        does not believe adenomas   Arrhythmia Mother    Colon cancer Neg Hx     Social  History   Socioeconomic History   Marital status: Single    Spouse name: Not on file   Number of children: 1   Years of education: Not on file   Highest education level: Not on file  Occupational History   Occupation: unknown  Tobacco Use   Smoking status: Never    Passive exposure: Never   Smokeless tobacco: Never  Vaping Use   Vaping status: Never Used  Substance and Sexual Activity   Alcohol use: Not Currently   Drug use: Not Currently   Sexual activity: Not Currently  Other Topics Concern   Not on file  Social History Narrative   Patient found in hotel room; unsure of living situation prior.   Social Determinants of Health   Financial Resource Strain: Low Risk  (09/30/2019)   Received from [facility], [facility]   Overall Financial Resource Strain (CARDIA)    Difficulty of Paying Living Expenses: Not hard at all  Food Insecurity: No Food Insecurity (09/30/2019)  Received from [facility], [facility]   Hunger Vital Sign    Worried About Running Out of Food in the Last Year: Never true    Ran Out of Food in the Last Year: Never true  Transportation Needs: No Transportation Needs (09/30/2019)   Received from [facility], [facility]   [facility] - Transportation    Lack of Transportation (Medical): No    Lack of Transportation (Non-Medical): No  Physical Activity: Not on file  Stress: No Stress Concern Present (09/30/2019)   Received from [facility], [facility]   [facility] of Occupational Health - Occupational Stress Questionnaire    Feeling of Stress : Not at all  Social Connections: Not on file    Subjective: Review of Systems  Constitutional:  Negative for chills and fever.  HENT:  Negative for congestion and hearing loss.   Eyes:  Negative for blurred vision and double vision.  Respiratory:  Negative for cough and shortness of breath.   Cardiovascular:  Negative for chest pain and palpitations.  Gastrointestinal:   Negative for abdominal pain, blood in stool, constipation, diarrhea, heartburn, melena and vomiting.  Genitourinary:  Negative for dysuria and urgency.  Musculoskeletal:  Negative for joint pain and myalgias.  Skin:  Negative for itching and rash.  Neurological:  Negative for dizziness and headaches.  Psychiatric/Behavioral:  Negative for depression. The patient is not nervous/anxious.      Objective: BP 90/64 (BP Location: Left Arm, Patient Position: Sitting, Cuff Size: Large)   Pulse 84   Temp 99.1 F (37.3 C) (Oral)   Ht 4\' 9"  (1.448 m)   Wt 186 lb 4.8 oz (84.5 kg)   LMP 11/30/2021   BMI 40.31 kg/m  Physical Exam Constitutional:      Appearance: Normal appearance.  HENT:     Head: Normocephalic and atraumatic.  Eyes:     Extraocular Movements: Extraocular movements intact.     Conjunctiva/sclera: Conjunctivae normal.  Cardiovascular:     Rate and Rhythm: Normal rate and regular rhythm.  Pulmonary:     Effort: Pulmonary effort is normal.     Breath sounds: Normal breath sounds.  Abdominal:     General: Bowel sounds are normal.     Palpations: Abdomen is soft.  Musculoskeletal:        General: No swelling. Normal range of motion.     Cervical back: Normal range of motion and neck supple.  Skin:    General: Skin is warm and dry.     Coloration: Skin is not jaundiced.  Neurological:     General: No focal deficit present.     Mental Status: She is alert and oriented to person, place, and time.  Psychiatric:        Mood and Affect: Mood normal.        Behavior: Behavior normal.      Assessment/Plan:  1.  Peptic ulcer disease/upper GI bleed/anemia-patient never went to have her CBC done.  Will order today.  Call with results.  Continue omeprazole twice daily.  Likely this ulcer was NSAID induced, counseled on avoidance.  Tentatively plan on repeat upper endoscopy early October to evaluate healing.  2.  Abnormal LFTs with concern for cirrhosis-patient never had her  ultrasound completed.  We will reschedule today.  Call with results.  Counseled on alcohol avoidance.  3.  Chronic diarrhea-occurring since colostomy takedown.  Previously started on cholestyramine and states this helps her symptoms though she stopped  taking it as it "did not taste good."  Recommend she resume this medication.  She can take Imodium as needed on top of this.  Follow-up in 6 weeks.  02/14/2023 11:23 AM   Disclaimer: This note was dictated with voice recognition software. Similar sounding words can inadvertently be transcribed and may not be corrected upon review.

## 2023-02-14 NOTE — Telephone Encounter (Signed)
LMOVM that referral to wound clinic was sent on 5/21. A new referral was sent today, they should be contacting you to schedule appointment, if you haven't heard anything in a week to give [place] a call.

## 2023-02-15 DIAGNOSIS — Z6835 Body mass index (BMI) 35.0-35.9, adult: Secondary | ICD-10-CM | POA: Diagnosis not present

## 2023-02-15 DIAGNOSIS — N760 Acute vaginitis: Secondary | ICD-10-CM | POA: Diagnosis not present

## 2023-02-15 DIAGNOSIS — R7989 Other specified abnormal findings of blood chemistry: Secondary | ICD-10-CM | POA: Diagnosis not present

## 2023-02-15 DIAGNOSIS — E871 Hypo-osmolality and hyponatremia: Secondary | ICD-10-CM | POA: Diagnosis not present

## 2023-02-15 DIAGNOSIS — R300 Dysuria: Secondary | ICD-10-CM | POA: Diagnosis not present

## 2023-02-15 DIAGNOSIS — F1011 Alcohol abuse, in remission: Secondary | ICD-10-CM | POA: Diagnosis not present

## 2023-02-15 DIAGNOSIS — R030 Elevated blood-pressure reading, without diagnosis of hypertension: Secondary | ICD-10-CM | POA: Diagnosis not present

## 2023-02-15 DIAGNOSIS — K259 Gastric ulcer, unspecified as acute or chronic, without hemorrhage or perforation: Secondary | ICD-10-CM | POA: Diagnosis not present

## 2023-02-15 DIAGNOSIS — D509 Iron deficiency anemia, unspecified: Secondary | ICD-10-CM | POA: Diagnosis not present

## 2023-02-15 DIAGNOSIS — Z9889 Other specified postprocedural states: Secondary | ICD-10-CM | POA: Diagnosis not present

## 2023-02-23 DIAGNOSIS — S92355D Nondisplaced fracture of fifth metatarsal bone, left foot, subsequent encounter for fracture with routine healing: Secondary | ICD-10-CM | POA: Diagnosis not present

## 2023-03-01 ENCOUNTER — Ambulatory Visit (HOSPITAL_COMMUNITY)
Admission: RE | Admit: 2023-03-01 | Discharge: 2023-03-01 | Disposition: A | Discharge status: Home / Self Care | Payer: 59 | Source: Ambulatory Visit | Attending: Gastroenterology | Admitting: Gastroenterology

## 2023-03-01 ENCOUNTER — Other Ambulatory Visit (HOSPITAL_COMMUNITY)
Admission: RE | Admit: 2023-03-01 | Discharge: 2023-03-01 | Disposition: A | Discharge status: Home / Self Care | Payer: 59 | Source: Ambulatory Visit | Attending: Gastroenterology | Admitting: Gastroenterology

## 2023-03-01 DIAGNOSIS — K7460 Unspecified cirrhosis of liver: Secondary | ICD-10-CM | POA: Diagnosis not present

## 2023-03-01 DIAGNOSIS — K838 Other specified diseases of biliary tract: Secondary | ICD-10-CM | POA: Diagnosis not present

## 2023-03-01 DIAGNOSIS — K709 Alcoholic liver disease, unspecified: Secondary | ICD-10-CM

## 2023-03-01 DIAGNOSIS — Z944 Liver transplant status: Secondary | ICD-10-CM | POA: Diagnosis not present

## 2023-03-01 DIAGNOSIS — K760 Fatty (change of) liver, not elsewhere classified: Secondary | ICD-10-CM | POA: Diagnosis not present

## 2023-03-01 LAB — CBC
HCT: 36.5 % (ref 36.0–46.0)
Hemoglobin: 11 g/dL — ABNORMAL LOW (ref 12.0–15.0)
MCH: 26.3 pg (ref 26.0–34.0)
MCHC: 30.1 g/dL (ref 30.0–36.0)
MCV: 87.3 fL (ref 80.0–100.0)
Platelets: 209 10*3/uL (ref 150–400)
RBC: 4.18 MIL/uL (ref 3.87–5.11)
RDW: 16.8 % — ABNORMAL HIGH (ref 11.5–15.5)
WBC: 4 10*3/uL (ref 4.0–10.5)
nRBC: 0 % (ref 0.0–0.2)

## 2023-03-09 ENCOUNTER — Inpatient Hospital Stay (HOSPITAL_COMMUNITY)
Admission: EM | Admit: 2023-03-09 | Discharge: 2023-03-10 | DRG: 378 | Disposition: A | Discharge status: Home / Self Care | Payer: 59 | Attending: Internal Medicine | Admitting: Internal Medicine

## 2023-03-09 ENCOUNTER — Other Ambulatory Visit: Payer: Self-pay

## 2023-03-09 ENCOUNTER — Inpatient Hospital Stay (HOSPITAL_COMMUNITY): Payer: 59

## 2023-03-09 ENCOUNTER — Encounter (HOSPITAL_COMMUNITY): Payer: Self-pay | Admitting: *Deleted

## 2023-03-09 DIAGNOSIS — E876 Hypokalemia: Secondary | ICD-10-CM | POA: Diagnosis not present

## 2023-03-09 DIAGNOSIS — Z23 Encounter for immunization: Secondary | ICD-10-CM | POA: Diagnosis not present

## 2023-03-09 DIAGNOSIS — K922 Gastrointestinal hemorrhage, unspecified: Secondary | ICD-10-CM

## 2023-03-09 DIAGNOSIS — Z79899 Other long term (current) drug therapy: Secondary | ICD-10-CM

## 2023-03-09 DIAGNOSIS — Z83719 Family history of colon polyps, unspecified: Secondary | ICD-10-CM | POA: Diagnosis not present

## 2023-03-09 DIAGNOSIS — K7682 Hepatic encephalopathy: Secondary | ICD-10-CM | POA: Diagnosis not present

## 2023-03-09 DIAGNOSIS — K2991 Gastroduodenitis, unspecified, with bleeding: Secondary | ICD-10-CM | POA: Diagnosis not present

## 2023-03-09 DIAGNOSIS — K279 Peptic ulcer, site unspecified, unspecified as acute or chronic, without hemorrhage or perforation: Secondary | ICD-10-CM | POA: Diagnosis not present

## 2023-03-09 DIAGNOSIS — K641 Second degree hemorrhoids: Secondary | ICD-10-CM | POA: Diagnosis present

## 2023-03-09 DIAGNOSIS — K529 Noninfective gastroenteritis and colitis, unspecified: Secondary | ICD-10-CM | POA: Diagnosis not present

## 2023-03-09 DIAGNOSIS — K838 Other specified diseases of biliary tract: Secondary | ICD-10-CM | POA: Diagnosis present

## 2023-03-09 DIAGNOSIS — K644 Residual hemorrhoidal skin tags: Secondary | ICD-10-CM | POA: Diagnosis present

## 2023-03-09 DIAGNOSIS — K7460 Unspecified cirrhosis of liver: Secondary | ICD-10-CM | POA: Diagnosis present

## 2023-03-09 DIAGNOSIS — L89152 Pressure ulcer of sacral region, stage 2: Secondary | ICD-10-CM | POA: Diagnosis present

## 2023-03-09 DIAGNOSIS — K2990 Gastroduodenitis, unspecified, without bleeding: Secondary | ICD-10-CM | POA: Diagnosis not present

## 2023-03-09 DIAGNOSIS — K2970 Gastritis, unspecified, without bleeding: Secondary | ICD-10-CM

## 2023-03-09 DIAGNOSIS — R188 Other ascites: Secondary | ICD-10-CM | POA: Diagnosis present

## 2023-03-09 DIAGNOSIS — R160 Hepatomegaly, not elsewhere classified: Secondary | ICD-10-CM | POA: Diagnosis not present

## 2023-03-09 DIAGNOSIS — Z8739 Personal history of other diseases of the musculoskeletal system and connective tissue: Secondary | ICD-10-CM

## 2023-03-09 DIAGNOSIS — R748 Abnormal levels of other serum enzymes: Secondary | ICD-10-CM | POA: Diagnosis present

## 2023-03-09 DIAGNOSIS — N281 Cyst of kidney, acquired: Secondary | ICD-10-CM | POA: Diagnosis not present

## 2023-03-09 DIAGNOSIS — Z9889 Other specified postprocedural states: Secondary | ICD-10-CM | POA: Insufficient documentation

## 2023-03-09 DIAGNOSIS — K8020 Calculus of gallbladder without cholecystitis without obstruction: Secondary | ICD-10-CM | POA: Diagnosis not present

## 2023-03-09 DIAGNOSIS — K5730 Diverticulosis of large intestine without perforation or abscess without bleeding: Secondary | ICD-10-CM | POA: Diagnosis not present

## 2023-03-09 DIAGNOSIS — K626 Ulcer of anus and rectum: Secondary | ICD-10-CM | POA: Diagnosis present

## 2023-03-09 DIAGNOSIS — F1021 Alcohol dependence, in remission: Secondary | ICD-10-CM | POA: Diagnosis not present

## 2023-03-09 DIAGNOSIS — K625 Hemorrhage of anus and rectum: Secondary | ICD-10-CM | POA: Diagnosis not present

## 2023-03-09 DIAGNOSIS — E871 Hypo-osmolality and hyponatremia: Secondary | ICD-10-CM | POA: Diagnosis not present

## 2023-03-09 DIAGNOSIS — D62 Acute posthemorrhagic anemia: Secondary | ICD-10-CM | POA: Diagnosis present

## 2023-03-09 DIAGNOSIS — D124 Benign neoplasm of descending colon: Secondary | ICD-10-CM | POA: Diagnosis present

## 2023-03-09 DIAGNOSIS — K5731 Diverticulosis of large intestine without perforation or abscess with bleeding: Secondary | ICD-10-CM | POA: Diagnosis present

## 2023-03-09 DIAGNOSIS — K254 Chronic or unspecified gastric ulcer with hemorrhage: Secondary | ICD-10-CM | POA: Diagnosis not present

## 2023-03-09 DIAGNOSIS — I10 Essential (primary) hypertension: Secondary | ICD-10-CM | POA: Diagnosis not present

## 2023-03-09 LAB — CBC
HCT: 30.9 % — ABNORMAL LOW (ref 36.0–46.0)
Hemoglobin: 10 g/dL — ABNORMAL LOW (ref 12.0–15.0)
MCH: 27.1 pg (ref 26.0–34.0)
MCHC: 32.4 g/dL (ref 30.0–36.0)
MCV: 83.7 fL (ref 80.0–100.0)
Platelets: 223 10*3/uL (ref 150–400)
RBC: 3.69 MIL/uL — ABNORMAL LOW (ref 3.87–5.11)
RDW: 17.2 % — ABNORMAL HIGH (ref 11.5–15.5)
WBC: 3.8 10*3/uL — ABNORMAL LOW (ref 4.0–10.5)
nRBC: 0 % (ref 0.0–0.2)

## 2023-03-09 LAB — COMPREHENSIVE METABOLIC PANEL
ALT: 53 U/L — ABNORMAL HIGH (ref 0–44)
AST: 119 U/L — ABNORMAL HIGH (ref 15–41)
Albumin: 3.7 g/dL (ref 3.5–5.0)
Alkaline Phosphatase: 219 U/L — ABNORMAL HIGH (ref 38–126)
Anion gap: 13 (ref 5–15)
BUN: <5 mg/dL — ABNORMAL LOW (ref 6–20)
CO2: 19 mmol/L — ABNORMAL LOW (ref 22–32)
Calcium: 8.6 mg/dL — ABNORMAL LOW (ref 8.9–10.3)
Chloride: 95 mmol/L — ABNORMAL LOW (ref 98–111)
Creatinine, Ser: 0.44 mg/dL (ref 0.44–1.00)
GFR, Estimated: >60 mL/min (ref >60)
Glucose, Bld: 91 mg/dL (ref 70–99)
Potassium: 3.9 mmol/L (ref 3.5–5.1)
Sodium: 127 mmol/L — ABNORMAL LOW (ref 135–145)
Total Bilirubin: 1 mg/dL (ref 0.3–1.2)
Total Protein: 8.3 g/dL — ABNORMAL HIGH (ref 6.5–8.1)

## 2023-03-09 LAB — CBC WITH DIFFERENTIAL/PLATELET
Abs Immature Granulocytes: 0.01 10*3/uL (ref 0.00–0.07)
Basophils Absolute: 0 10*3/uL (ref 0.0–0.1)
Basophils Relative: 1 %
Eosinophils Absolute: 0.1 10*3/uL (ref 0.0–0.5)
Eosinophils Relative: 2 %
HCT: 36.4 % (ref 36.0–46.0)
Hemoglobin: 11.2 g/dL — ABNORMAL LOW (ref 12.0–15.0)
Immature Granulocytes: 0 %
Lymphocytes Relative: 24 %
Lymphs Abs: 1.5 10*3/uL (ref 0.7–4.0)
MCH: 26.6 pg (ref 26.0–34.0)
MCHC: 30.8 g/dL (ref 30.0–36.0)
MCV: 86.5 fL (ref 80.0–100.0)
Monocytes Absolute: 0.3 10*3/uL (ref 0.1–1.0)
Monocytes Relative: 6 %
Neutro Abs: 4 10*3/uL (ref 1.7–7.7)
Neutrophils Relative %: 67 %
Platelets: 149 10*3/uL — ABNORMAL LOW (ref 150–400)
RBC: 4.21 MIL/uL (ref 3.87–5.11)
RDW: 17.1 % — ABNORMAL HIGH (ref 11.5–15.5)
WBC: 6 10*3/uL (ref 4.0–10.5)
nRBC: 0 % (ref 0.0–0.2)

## 2023-03-09 LAB — HEMOGLOBIN AND HEMATOCRIT, BLOOD
HCT: 30.9 % — ABNORMAL LOW (ref 36.0–46.0)
Hemoglobin: 9.8 g/dL — ABNORMAL LOW (ref 12.0–15.0)

## 2023-03-09 LAB — TYPE AND SCREEN
ABO/RH(D): A POS
Antibody Screen: NEGATIVE

## 2023-03-09 LAB — PROTIME-INR
INR: 1.1 (ref 0.8–1.2)
Prothrombin Time: 14.4 seconds (ref 11.4–15.2)

## 2023-03-09 MED ORDER — ONDANSETRON HCL 4 MG PO TABS: 4.0000 mg | ORAL_TABLET | Freq: Four times a day (QID) | ORAL | Status: DC | PRN

## 2023-03-09 MED ORDER — FAMOTIDINE 20 MG PO TABS
20.0000 mg | ORAL_TABLET | Freq: Every day | ORAL | Status: DC
  Administered 2023-03-10: 20 mg via ORAL
  Filled 2023-03-09: qty 1

## 2023-03-09 MED ORDER — PNEUMOCOCCAL 20-VAL CONJ VACC 0.5 ML IM SUSY
0.5000 mL | PREFILLED_SYRINGE | INTRAMUSCULAR | Status: AC
  Administered 2023-03-10: 0.5 mL via INTRAMUSCULAR
  Filled 2023-03-09: qty 0.5

## 2023-03-09 MED ORDER — SODIUM CHLORIDE 0.9 % IV BOLUS
1000.0000 mL | Freq: Once | INTRAVENOUS | Status: AC
  Administered 2023-03-09: 1000 mL via INTRAVENOUS

## 2023-03-09 MED ORDER — SODIUM CHLORIDE 0.9 % IV SOLN: INTRAVENOUS | Status: DC

## 2023-03-09 MED ORDER — ONDANSETRON HCL 4 MG/2ML IJ SOLN: 4.0000 mg | Freq: Four times a day (QID) | INTRAMUSCULAR | Status: DC | PRN

## 2023-03-09 MED ORDER — IOHEXOL 300 MG/ML  SOLN
100.0000 mL | Freq: Once | INTRAMUSCULAR | Status: AC | PRN
  Administered 2023-03-09: 100 mL via INTRAVENOUS

## 2023-03-09 MED ORDER — FENTANYL CITRATE PF 50 MCG/ML IJ SOSY
50.0000 ug | PREFILLED_SYRINGE | Freq: Once | INTRAMUSCULAR | Status: AC
  Administered 2023-03-09: 50 ug via INTRAVENOUS
  Filled 2023-03-09: qty 1

## 2023-03-09 MED ORDER — PANTOPRAZOLE SODIUM 40 MG PO TBEC
40.0000 mg | DELAYED_RELEASE_TABLET | Freq: Every day | ORAL | Status: DC
  Administered 2023-03-10: 40 mg via ORAL
  Filled 2023-03-09: qty 1

## 2023-03-09 MED ORDER — PEG 3350-KCL-NA BICARB-NACL 420 G PO SOLR
4000.0000 mL | Freq: Once | ORAL | Status: AC
  Administered 2023-03-10: 4000 mL via ORAL

## 2023-03-09 MED ORDER — HYDROCODONE-ACETAMINOPHEN 5-325 MG PO TABS
1.0000 | ORAL_TABLET | ORAL | Status: DC | PRN
  Administered 2023-03-09 – 2023-03-10 (×2): 1 via ORAL
  Filled 2023-03-09 (×2): qty 1

## 2023-03-09 MED ORDER — HYDROXYZINE HCL 25 MG PO TABS
25.0000 mg | ORAL_TABLET | ORAL | Status: DC | PRN
  Administered 2023-03-09: 25 mg via ORAL
  Filled 2023-03-09: qty 1

## 2023-03-09 MED ORDER — GABAPENTIN 100 MG PO CAPS
100.0000 mg | ORAL_CAPSULE | Freq: Every evening | ORAL | Status: DC | PRN
  Administered 2023-03-09: 100 mg via ORAL
  Filled 2023-03-09: qty 1

## 2023-03-09 NOTE — ED Notes (Signed)
Nurse called CT to inform pt is ready- needs scan prior to transport to admission bed- CT on way now

## 2023-03-09 NOTE — ED Provider Notes (Signed)
[place] EMERGENCY DEPARTMENT AT [facility] Provider Note   CSN: [id number] Arrival date & time: 03/09/23  1408     History  Chief Complaint  Patient presents with   GI Bleeding    Alyssa Carey is a 52 y.o. female.  The history is provided by the patient and medical records. No language interpreter was used.     52 year old female significant history of the necrotizing fasciitis status post diverting colostomy in June 2021, alcohol abuse, history of PUD causing GI bleeding, hepatitis, presenting today with complaints of rectal bleeding.  Patient states since yesterday she has had persistent diarrhea mixed with bright red blood.  She has had numerous episodes of diarrhea and now she noticed nothing but blood and not much stools in it.  She did endorse some rectal discomfort but denies any constipation no significant abdominal pain no lightheadedness or dizziness no chest pain or shortness of breath.  She denies any recent alcohol use.  She denies any black tarry stool.  She has had a previous colostomy which has been diverted.  No recent antibiotic use.  Home Medications Prior to Admission medications   Medication Sig Start Date End Date Taking? Authorizing Provider  cholecalciferol (VITAMIN D3) 25 MCG (1000 UNIT) tablet Take 1,000 Units by mouth daily.    [provider]  famotidine (PEPCID) 20 MG tablet Take 20 mg by mouth daily. 11/11/21   [provider]  fluticasone (FLONASE) 50 MCG/ACT nasal spray Place 2 sprays into both nostrils daily. 09/15/22   [provider]  gabapentin (NEURONTIN) 100 MG capsule Take 1 capsule (100 mg total) by mouth at bedtime as needed (pain). 11/13/22   Vassie Loll, MD  hydrocortisone (ANUSOL-HC) 2.5 % rectal cream Place 1 Application rectally 2 (two) times daily. 12/05/22   Gelene Mink, NP  hydrOXYzine (ATARAX) 25 MG tablet Take 25 mg by mouth as needed for itching. 09/28/22   [provider]   Ipratropium-Albuterol (COMBIVENT) 20-100 MCG/ACT AERS respimat Inhale 1 puff into the lungs every 6 (six) hours as needed for wheezing or shortness of breath. 11/13/22   Vassie Loll, MD  loratadine (CLARITIN) 10 MG tablet Take 1 tablet (10 mg total) by mouth daily. 11/14/22   Vassie Loll, MD  montelukast (SINGULAIR) 10 MG tablet Take 10 mg by mouth at bedtime. 09/19/22   [provider]  Nystatin (GERHARDT'S BUTT CREAM) CREA Apply 1 Application topically 4 (four) times daily as needed for irritation. 11/13/22   Vassie Loll, MD  omeprazole (PRILOSEC) 40 MG capsule Take 1 capsule (40 mg total) by mouth 2 (two) times daily. 01/19/23   Tyrone Nine, MD  vitamin C (ASCORBIC ACID) 500 MG tablet Take 500 mg by mouth daily.    [provider]      Allergies    Pantoprazole, Penicillins, and Oxycodone hcl    Review of Systems   Review of Systems  All other systems reviewed and are negative.   Physical Exam Updated Vital Signs BP 134/87 (BP Location: Right Arm)   Pulse 85   Temp 98.6 F (37 C) (Oral)   Resp 14   Ht 4\' 9"  (1.448 m)   Wt 82.4 kg   LMP 11/30/2021   SpO2 100%   BMI 39.30 kg/m  Physical Exam Vitals and nursing note reviewed.  Constitutional:      General: She is not in acute distress.    Appearance: She is well-developed. She is obese.  HENT:  Head: Atraumatic.  Eyes:     Conjunctiva/sclera: Conjunctivae normal.  Cardiovascular:     Rate and Rhythm: Normal rate and regular rhythm.     Pulses: Normal pulses.     Heart sounds: Normal heart sounds.  Pulmonary:     Effort: Pulmonary effort is normal.  Abdominal:     Palpations: Abdomen is soft.     Tenderness: There is no abdominal tenderness.  Genitourinary:    Comments: Chaperone present during exam.  Patient does have gross blood on rectal exam.  Normal rectal tone.  She has several small decubitus ulcers noted in her sacral area without signs of infection.  No obvious mass. Musculoskeletal:      Cervical back: Neck supple.  Skin:    Findings: No rash.  Neurological:     Mental Status: She is alert.  Psychiatric:        Mood and Affect: Mood normal.     ED Results / Procedures / Treatments   Labs (all labs ordered are listed, but only abnormal results are displayed) Labs Reviewed  CBC WITH DIFFERENTIAL/PLATELET - Abnormal; Notable for the following components:      Result Value   Hemoglobin 11.2 (*)    RDW 17.1 (*)    Platelets 149 (*)    All other components within normal limits  COMPREHENSIVE METABOLIC PANEL - Abnormal; Notable for the following components:   Sodium 127 (*)    Chloride 95 (*)    CO2 19 (*)    BUN <5 (*)    Calcium 8.6 (*)    Total Protein 8.3 (*)    AST 119 (*)    ALT 53 (*)    Alkaline Phosphatase 219 (*)    All other components within normal limits  POC OCCULT BLOOD, ED    EKG None  Radiology No results found.  Procedures Procedures    Medications Ordered in ED Medications  sodium chloride 0.9 % bolus 1,000 mL (1,000 mLs Intravenous New Bag/Given 03/09/23 1742)    ED Course/ Medical Decision Making/ A&P                                 Medical Decision Making Amount and/or Complexity of Data Reviewed Labs: ordered.   BP 134/87 (BP Location: Right Arm)   Pulse 85   Temp 98.6 F (37 C) (Oral)   Resp 14   Ht 4\' 9"  (1.448 m)   Wt 82.4 kg   LMP 11/30/2021   SpO2 100%   BMI 39.30 kg/m   28:35 PM  52 year old female significant history of the present fasciitis status post diverting colostomy in June 2021, alcohol abuse, history of PUD causing GI bleeding, hepatitis, presenting today with complaints of rectal bleeding.  Patient states since yesterday she has had persistent diarrhea mixed with bright red blood.  She has had numerous episodes of diarrhea and now she noticed nothing but blood and not much stools in it.  She did endorse some rectal discomfort but denies any constipation no significant abdominal pain no  lightheadedness or dizziness no chest pain or shortness of breath.  She denies any recent alcohol use.  She denies any black tarry stool.  She has had a previous colostomy which has been diverted.   On exam this is an obese female resting comfortably in bed appears to be in no acute discomfort.  She has a very benign abdominal exam, but on rectal  exam she does have gross bright red blood noted in rectal vault.  She has several small sacral decubitus ulcers as well.  -Labs ordered, independently viewed and interpreted by me.  Labs remarkable for Hgb 11.2.  it was 8 from last month.  Na+ 127, IVF given.  AST 119, ALt 53, alp phos 219.   -The patient was maintained on a cardiac monitor.  I personally viewed and interpreted the cardiac monitored which showed an underlying rhythm of: NSR -Imaging including endoscopy/colonoscopy from last month done by DR. Rourk which shows ulcers x6 (5 small and 1 big); erosions -This patient presents to the ED for concern of rectal bleeding, this involves an extensive number of treatment options, and is a complaint that carries with it a high risk of complications and morbidity.  The differential diagnosis includes bleeding hemorrhoid, AVM, colitis, diverticular bleed, UC, Crohn's, malignancy -Co morbidities that complicate the patient evaluation includes PUD, GIB, alcohol abuse -Treatment includes IVF,  -Reevaluation of the patient after these medicines showed that the patient improved -PCP office notes or outside notes reviewed -Discussion with specialist oncall GI Dr. Tasia Catchings who doesn't recommend any specific intervention at the moment. The GI team is available for consultation tomorrow if pt is admitted -Escalation to admission/observation considered: dispo pending.  Pt sign out to Dr. Rubin Payor. Anticipate rechecking H&H.           Final Clinical Impression(s) / ED Diagnoses Final diagnoses:  BRBPR (bright red blood per rectum)    Rx / DC Orders ED  Discharge Orders     None         Fayrene Helper, PA-C 03/09/23 1850    Benjiman Core, MD 03/09/23 2048

## 2023-03-09 NOTE — ED Notes (Signed)
Pt with large bloody bm x2 since beginning of shift at 1900. MD informed.

## 2023-03-09 NOTE — H&P (Signed)
History and Physical    Patient: Alyssa Carey ZOX:[id number] DOB: 11-13-1970 DOA: 03/09/2023 DOS: the patient was seen and examined on 03/09/2023 PCP: Richardean Chimera, MD  Patient coming from: Home  Chief Complaint:  Chief Complaint  Patient presents with   GI Bleeding   HPI: Alyssa Carey is a 52 y.o. female with medical history significant of recent upper GI bleed, history of diverting loop colostomy with reversal in 2023, history of alcoholism in remission, cirrhosis.  Patient admitted 01/16/2023 with upper GI bleed.  She was transfused during that hospitalization and discharged on 7/5.  She has been on PPIs, H2 blockers.  She started having bleeding since yesterday with diarrhea.  She has had multiple episodes per day including 4 episodes while here.  No lightheadedness, dizziness.  She does have some left lower quadrant abdominal pain and received some fentanyl while here in the emergency department.  Initially her hemoglobin was 7.2.  Repeat H&H 4 hours later was 9.8.  GI was consulted who will see her in the morning.  Review of Systems: As mentioned in the history of present illness. All other systems reviewed and are negative. Past Medical History:  Diagnosis Date   Alcohol abuse    HTN (hypertension) 10/05/2020   Necrotizing fasciitis [facility])    Past Surgical History:  Procedure Laterality Date   BIOPSY  10/07/2020   Procedure: BIOPSY;  Surgeon: Corbin Ade, MD;  Location: [facility];  Service: Endoscopy;;   COLONOSCOPY WITH PROPOFOL N/A 10/07/2020   single healing rectal ulcer in distal rectum with surrounding mucosal friability and markedly abnormal proximal colon with ulceration s/p biopsies.   COLONOSCOPY WITH PROPOFOL  02/22/2021   Procedure: COLONOSCOPY WITH PROPOFOL;  Surgeon: Lanelle Bal, DO;  Location: [facility];  Service: Endoscopy;;   COLOSTOMY TAKEDOWN N/A 01/26/2022   Procedure: LAPAROSCOPIC COLOSTOMY TAKEDOWN;  Surgeon: Romie Levee, MD;   Location: [facility] ORS;  Service: General;  Laterality: N/A;  laparoscopic assisted colostomy reversal and partial colectomy   ESOPHAGOGASTRODUODENOSCOPY (EGD) WITH PROPOFOL N/A 10/07/2020   non-bleeding gastric ulcer . Pathology with H.pylori negative   ESOPHAGOGASTRODUODENOSCOPY (EGD) WITH PROPOFOL N/A 12/27/2020   Procedure: ESOPHAGOGASTRODUODENOSCOPY (EGD) WITH PROPOFOL;  Surgeon: Lanelle Bal, DO;  [facility];  Service: Endoscopy;  Laterality: N/A;  1:00pm   ESOPHAGOGASTRODUODENOSCOPY (EGD) WITH PROPOFOL N/A 01/17/2023   Procedure: ESOPHAGOGASTRODUODENOSCOPY (EGD) WITH PROPOFOL;  Surgeon: Corbin Ade, MD;  [facility];  Service: Endoscopy;  Laterality: N/A;   FLEXIBLE SIGMOIDOSCOPY N/A 01/11/2020   Procedure: FLEXIBLE SIGMOIDOSCOPY;  Surgeon: Jeani Hawking, MD;  Location: [facility];  Service: Gastroenterology;  Laterality: N/A;   INCISION AND DRAINAGE PERIRECTAL ABSCESS N/A 12/25/2019   Procedure: IRRIGATION AND DEBRIDEMENT BUTTOCKS, LAP LOOP COLOSTOMY;  Surgeon: Gaynelle Adu, MD;  Location: [facility];  Service: General;  Laterality: N/A;   IRRIGATION AND DEBRIDEMENT ABSCESS N/A 12/23/2019   Procedure: EXCISION AND DEBRIDEMENT LEFT BUTTOCK AND PERINEUM;  Surgeon: Berna Bue, MD;  Location: [facility];  Service: General;  Laterality: N/A;   LAPAROSCOPIC LOOP COLOSTOMY N/A 12/25/2019   Procedure: LAPAROSCOPIC LOOP COLOSTOMY;  Surgeon: Gaynelle Adu, MD;  Location: [facility];  Service: General;  Laterality: N/A;   TONSILLECTOMY     Social History:  reports that she has never smoked. She has never been exposed to tobacco smoke. She has never used smokeless tobacco. She reports that she does not currently use alcohol. She reports that she does not currently use drugs.  Allergies  Allergen Reactions   Pantoprazole     Stomach pain   Penicillins Hives    Did it involve swelling of the face/tongue/throat, SOB, or low BP? N Did it involve sudden or severe rash/hives,  skin peeling, or any reaction on the inside of your mouth or nose? Y Did you need to seek medical attention at a hospital or doctor's office? N When did it last happen?  Childhood     If all above answers are "NO", may proceed with cephalosporin use.    Oxycodone Hcl Rash    Family History  Problem Relation Age of Onset   Colon polyps Mother        does not believe adenomas   Arrhythmia Mother    Colon cancer Neg Hx     Prior to Admission medications   Medication Sig Start Date End Date Taking? Authorizing Provider  cholecalciferol (VITAMIN D3) 25 MCG (1000 UNIT) tablet Take 1,000 Units by mouth daily.    [provider]  famotidine (PEPCID) 20 MG tablet Take 20 mg by mouth daily. 11/11/21   [provider]  fluticasone (FLONASE) 50 MCG/ACT nasal spray Place 2 sprays into both nostrils daily. 09/15/22   [provider]  gabapentin (NEURONTIN) 100 MG capsule Take 1 capsule (100 mg total) by mouth at bedtime as needed (pain). 11/13/22   Vassie Loll, MD  hydrocortisone (ANUSOL-HC) 2.5 % rectal cream Place 1 Application rectally 2 (two) times daily. 12/05/22   Gelene Mink, NP  hydrOXYzine (ATARAX) 25 MG tablet Take 25 mg by mouth as needed for itching. 09/28/22   [provider]  Ipratropium-Albuterol (COMBIVENT) 20-100 MCG/ACT AERS respimat Inhale 1 puff into the lungs every 6 (six) hours as needed for wheezing or shortness of breath. 11/13/22   Vassie Loll, MD  loratadine (CLARITIN) 10 MG tablet Take 1 tablet (10 mg total) by mouth daily. 11/14/22   Vassie Loll, MD  montelukast (SINGULAIR) 10 MG tablet Take 10 mg by mouth at bedtime. 09/19/22   [provider]  Nystatin (GERHARDT'S BUTT CREAM) CREA Apply 1 Application topically 4 (four) times daily as needed for irritation. 11/13/22   Vassie Loll, MD  omeprazole (PRILOSEC) 40 MG capsule Take 1 capsule (40 mg total) by mouth 2 (two) times daily. 01/19/23   Tyrone Nine, MD  vitamin C (ASCORBIC  ACID) 500 MG tablet Take 500 mg by mouth daily.    [provider]    Physical Exam: Vitals:   03/09/23 1917 03/09/23 1930 03/09/23 2035 03/09/23 2040  BP:  122/71 122/65   Pulse:  93 88 88  Resp:   18   Temp: 98.4 F (36.9 C)     TempSrc: Oral     SpO2:  97% 100% 100%  Weight:      Height:       General: Middle-age female. Awake and alert and oriented x3. No acute cardiopulmonary distress.  HEENT: Normocephalic atraumatic.  Right and left ears normal in appearance.  Pupils equal, round, reactive to light. Extraocular muscles are intact. Sclerae anicteric and noninjected.  Moist mucosal membranes. No mucosal lesions.  Neck: Neck supple without lymphadenopathy. No carotid bruits. No masses palpated.  Cardiovascular: Regular rate with normal S1-S2 sounds. No murmurs, rubs, gallops auscultated. No JVD.  Respiratory: Good respiratory effort with no wheezes, rales, rhonchi. Lungs clear to auscultation bilaterally.  No accessory muscle use. Abdomen: Soft, mild left lower quadrant pain with no rebound or guarding, nondistended. Active bowel sounds.  No masses or hepatosplenomegaly  Skin: No rashes, lesions, or ulcerations.  Dry, warm to touch. 2+ dorsalis pedis and radial pulses. Musculoskeletal: No calf or leg pain. All major joints not erythematous nontender.  No upper or lower joint deformation.  Good ROM.  No contractures  Psychiatric: Intact judgment and insight. Pleasant and cooperative. Neurologic: No focal neurological deficits. Strength is 5/5 and symmetric in upper and lower extremities.  Cranial nerves II through XII are grossly intact.  Data Reviewed: { Results for orders placed or performed during the hospital encounter of 03/09/23 (from the past 24 hour(s))  CBC with Differential     Status: Abnormal   Collection Time: 03/09/23  3:20 PM  Result Value Ref Range   WBC 6.0 4.0 - 10.5 K/uL   RBC 4.21 3.87 - 5.11 MIL/uL   Hemoglobin 11.2 (L) 12.0 - 15.0 g/dL   HCT 16.1  09.6 - 04.5 %   MCV 86.5 80.0 - 100.0 fL   MCH 26.6 26.0 - 34.0 pg   MCHC 30.8 30.0 - 36.0 g/dL   RDW 40.9 (H) 81.1 - 91.4 %   Platelets 149 (L) 150 - 400 K/uL   nRBC 0.0 0.0 - 0.2 %   Neutrophils Relative % 67 %   Neutro Abs 4.0 1.7 - 7.7 K/uL   Lymphocytes Relative 24 %   Lymphs Abs 1.5 0.7 - 4.0 K/uL   Monocytes Relative 6 %   Monocytes Absolute 0.3 0.1 - 1.0 K/uL   Eosinophils Relative 2 %   Eosinophils Absolute 0.1 0.0 - 0.5 K/uL   Basophils Relative 1 %   Basophils Absolute 0.0 0.0 - 0.1 K/uL   Immature Granulocytes 0 %   Abs Immature Granulocytes 0.01 0.00 - 0.07 K/uL  Comprehensive metabolic panel     Status: Abnormal   Collection Time: 03/09/23  3:20 PM  Result Value Ref Range   Sodium 127 (L) 135 - 145 mmol/L   Potassium 3.9 3.5 - 5.1 mmol/L   Chloride 95 (L) 98 - 111 mmol/L   CO2 19 (L) 22 - 32 mmol/L   Glucose, Bld 91 70 - 99 mg/dL   BUN <5 (L) 6 - 20 mg/dL   Creatinine, Ser 7.82 0.44 - 1.00 mg/dL   Calcium 8.6 (L) 8.9 - 10.3 mg/dL   Total Protein 8.3 (H) 6.5 - 8.1 g/dL   Albumin 3.7 3.5 - 5.0 g/dL   AST 956 (H) 15 - 41 U/L   ALT 53 (H) 0 - 44 U/L   Alkaline Phosphatase 219 (H) 38 - 126 U/L   Total Bilirubin 1.0 0.3 - 1.2 mg/dL   GFR, Estimated >21 >30 mL/min   Anion gap 13 5 - 15  Hemoglobin and hematocrit, blood     Status: Abnormal   Collection Time: 03/09/23  7:58 PM  Result Value Ref Range   Hemoglobin 9.8 (L) 12.0 - 15.0 g/dL   HCT 86.5 (L) 78.4 - 69.6 %  Type and screen [facility]     Status: None (Preliminary result)   Collection Time: 03/09/23  9:29 PM  Result Value Ref Range   ABO/RH(D) A POS    Antibody Screen PENDING    Sample Expiration      03/12/2023,2359 Performed at [facility], [address] [place], [place] [address]   Protime-INR     Status: None   Collection Time: 03/09/23  9:29 PM  Result Value Ref Range   Prothrombin Time 14.4 11.4 - 15.2 seconds  INR 1.1 0.8 - 1.2    No results found.   Assessment and  Plan: No notes have been filed under this hospital service. Service: Hospitalist  Principal Problem:   Acute blood loss anemia Active Problems:   Acute GI bleeding   HTN (hypertension)   Abnormal liver enzymes   Hypokalemia   Hyponatremia   History of colostomy reversal  Acute blood loss anemia Acute GI bleed Admit N.p.o. Will follow CBCs every 4 hours Type and screen At this point does not qualify for blood transfusion GI to see As patient has some abdominal pain, will get CT scan Patient states that she has not been drinking alcohol Cirrhosis Hyponatremia IV fluids - NS History of colostomy reversal   Advance Care Planning:   Code Status: Full Code   Consults: GI  Family Communication: Son present  Severity of Illness: The appropriate patient status for this patient is INPATIENT. Inpatient status is judged to be reasonable and necessary in order to provide the required intensity of service to ensure the patient's safety. The patient's presenting symptoms, physical exam findings, and initial radiographic and laboratory data in the context of their chronic comorbidities is felt to place them at high risk for further clinical deterioration. Furthermore, it is not anticipated that the patient will be medically stable for discharge from the hospital within 2 midnights of admission.   * I certify that at the point of admission it is my clinical judgment that the patient will require inpatient hospital care spanning beyond 2 midnights from the point of admission due to high intensity of service, high risk for further deterioration and high frequency of surveillance required.*  Author: Levie Heritage, DO 03/09/2023 10:00 PM  For on call review [email or web address].

## 2023-03-09 NOTE — ED Triage Notes (Signed)
Pt with bright red stools since yesterday, 4-5 stools yesterday and 4-5 stools today.  Pt states stool is loose like diarrhea.

## 2023-03-10 ENCOUNTER — Encounter (HOSPITAL_COMMUNITY)
Admission: EM | Disposition: A | Discharge status: Home / Self Care | Payer: Self-pay | Source: Home / Self Care | Attending: Internal Medicine

## 2023-03-10 ENCOUNTER — Inpatient Hospital Stay (HOSPITAL_COMMUNITY): Payer: 59 | Admitting: Anesthesiology

## 2023-03-10 ENCOUNTER — Encounter (HOSPITAL_COMMUNITY): Payer: Self-pay | Admitting: Family Medicine

## 2023-03-10 DIAGNOSIS — K922 Gastrointestinal hemorrhage, unspecified: Secondary | ICD-10-CM

## 2023-03-10 DIAGNOSIS — K2990 Gastroduodenitis, unspecified, without bleeding: Secondary | ICD-10-CM | POA: Diagnosis not present

## 2023-03-10 DIAGNOSIS — K5730 Diverticulosis of large intestine without perforation or abscess without bleeding: Secondary | ICD-10-CM | POA: Diagnosis not present

## 2023-03-10 DIAGNOSIS — D62 Acute posthemorrhagic anemia: Secondary | ICD-10-CM | POA: Diagnosis not present

## 2023-03-10 DIAGNOSIS — K2970 Gastritis, unspecified, without bleeding: Secondary | ICD-10-CM | POA: Diagnosis not present

## 2023-03-10 DIAGNOSIS — K641 Second degree hemorrhoids: Secondary | ICD-10-CM

## 2023-03-10 DIAGNOSIS — K635 Polyp of colon: Secondary | ICD-10-CM | POA: Diagnosis not present

## 2023-03-10 DIAGNOSIS — R748 Abnormal levels of other serum enzymes: Secondary | ICD-10-CM | POA: Diagnosis not present

## 2023-03-10 DIAGNOSIS — K254 Chronic or unspecified gastric ulcer with hemorrhage: Secondary | ICD-10-CM | POA: Diagnosis not present

## 2023-03-10 DIAGNOSIS — K529 Noninfective gastroenteritis and colitis, unspecified: Secondary | ICD-10-CM | POA: Diagnosis not present

## 2023-03-10 DIAGNOSIS — K838 Other specified diseases of biliary tract: Secondary | ICD-10-CM

## 2023-03-10 DIAGNOSIS — K279 Peptic ulcer, site unspecified, unspecified as acute or chronic, without hemorrhage or perforation: Secondary | ICD-10-CM | POA: Diagnosis not present

## 2023-03-10 DIAGNOSIS — K2950 Unspecified chronic gastritis without bleeding: Secondary | ICD-10-CM | POA: Diagnosis not present

## 2023-03-10 DIAGNOSIS — N289 Disorder of kidney and ureter, unspecified: Secondary | ICD-10-CM | POA: Diagnosis not present

## 2023-03-10 HISTORY — PX: BIOPSY: SHX5522

## 2023-03-10 HISTORY — PX: POLYPECTOMY: SHX5525

## 2023-03-10 HISTORY — PX: COLONOSCOPY WITH PROPOFOL: SHX5780

## 2023-03-10 HISTORY — PX: ESOPHAGOGASTRODUODENOSCOPY (EGD) WITH PROPOFOL: SHX5813

## 2023-03-10 LAB — CBC
HCT: 32.6 % — ABNORMAL LOW (ref 36.0–46.0)
HCT: 29.6 % — ABNORMAL LOW (ref 36.0–46.0)
Hemoglobin: 10.3 g/dL — ABNORMAL LOW (ref 12.0–15.0)
Hemoglobin: 9.2 g/dL — ABNORMAL LOW (ref 12.0–15.0)
MCH: 26.8 pg (ref 26.0–34.0)
MCH: 26.7 pg (ref 26.0–34.0)
MCHC: 31.6 g/dL (ref 30.0–36.0)
MCHC: 31.1 g/dL (ref 30.0–36.0)
MCV: 84.9 fL (ref 80.0–100.0)
MCV: 85.8 fL (ref 80.0–100.0)
Platelets: 193 10*3/uL (ref 150–400)
Platelets: 171 10*3/uL (ref 150–400)
RBC: 3.84 MIL/uL — ABNORMAL LOW (ref 3.87–5.11)
RBC: 3.45 MIL/uL — ABNORMAL LOW (ref 3.87–5.11)
RDW: 17.2 % — ABNORMAL HIGH (ref 11.5–15.5)
RDW: 17.3 % — ABNORMAL HIGH (ref 11.5–15.5)
WBC: 4 10*3/uL (ref 4.0–10.5)
WBC: 4.4 10*3/uL (ref 4.0–10.5)
nRBC: 0 % (ref 0.0–0.2)
nRBC: 0 % (ref 0.0–0.2)

## 2023-03-10 LAB — COMPREHENSIVE METABOLIC PANEL
ALT: 51 U/L — ABNORMAL HIGH (ref 0–44)
AST: 189 U/L — ABNORMAL HIGH (ref 15–41)
Albumin: 3.3 g/dL — ABNORMAL LOW (ref 3.5–5.0)
Alkaline Phosphatase: 206 U/L — ABNORMAL HIGH (ref 38–126)
Anion gap: 11 (ref 5–15)
BUN: <5 mg/dL — ABNORMAL LOW (ref 6–20)
CO2: 20 mmol/L — ABNORMAL LOW (ref 22–32)
Calcium: 8.5 mg/dL — ABNORMAL LOW (ref 8.9–10.3)
Chloride: 102 mmol/L (ref 98–111)
Creatinine, Ser: 0.47 mg/dL (ref 0.44–1.00)
GFR, Estimated: >60 mL/min (ref >60)
Glucose, Bld: 73 mg/dL (ref 70–99)
Potassium: 3.7 mmol/L (ref 3.5–5.1)
Sodium: 133 mmol/L — ABNORMAL LOW (ref 135–145)
Total Bilirubin: 1.5 mg/dL — ABNORMAL HIGH (ref 0.3–1.2)
Total Protein: 7.1 g/dL (ref 6.5–8.1)

## 2023-03-10 SURGERY — ESOPHAGOGASTRODUODENOSCOPY (EGD) WITH PROPOFOL
Anesthesia: General

## 2023-03-10 MED ORDER — PROPOFOL 10 MG/ML IV BOLUS
INTRAVENOUS | Status: AC
  Filled 2023-03-10: qty 20

## 2023-03-10 MED ORDER — DIPHENHYDRAMINE HCL 25 MG PO CAPS
25.0000 mg | ORAL_CAPSULE | Freq: Once | ORAL | Status: AC | PRN
  Administered 2023-03-10: 25 mg via ORAL
  Filled 2023-03-10: qty 1

## 2023-03-10 MED ORDER — LACTATED RINGERS IV SOLN: INTRAVENOUS | Status: DC | PRN

## 2023-03-10 MED ORDER — PROPOFOL 500 MG/50ML IV EMUL
INTRAVENOUS | Status: AC
  Filled 2023-03-10: qty 50

## 2023-03-10 MED ORDER — DIPHENHYDRAMINE HCL 25 MG PO CAPS
25.0000 mg | ORAL_CAPSULE | Freq: Four times a day (QID) | ORAL | Status: AC | PRN
  Administered 2023-03-10: 25 mg via ORAL
  Filled 2023-03-10: qty 1

## 2023-03-10 MED ORDER — OMEPRAZOLE 40 MG PO CPDR: 40.0000 mg | DELAYED_RELEASE_CAPSULE | Freq: Every day | ORAL | 1 refills | Status: DC

## 2023-03-10 MED ORDER — PROPOFOL 500 MG/50ML IV EMUL
INTRAVENOUS | Status: DC | PRN
  Administered 2023-03-10: 400 ug/kg/min via INTRAVENOUS

## 2023-03-10 MED ORDER — SODIUM CHLORIDE 0.9 % IV SOLN: INTRAVENOUS | Status: DC

## 2023-03-10 MED ORDER — KETOROLAC TROMETHAMINE 15 MG/ML IJ SOLN
15.0000 mg | Freq: Once | INTRAMUSCULAR | Status: AC
  Administered 2023-03-10: 15 mg via INTRAVENOUS
  Filled 2023-03-10: qty 1

## 2023-03-10 NOTE — Progress Notes (Addendum)
Patient underwent EGD and Colonoscopy under propofol sedation.  Tolerated the procedure adequately.   FINDINGS:  EGD - Normal esophagus. - Z- line regular. - Non- bleeding gastric ulcer with a clean ulcer base ( Forrest Class III) . Biopsied. - Normal duodenal bulb and second portion of the duodenum.  Colonoscopy:  The examined portion of the ileum was normal.  - A few erosions in the sigmoid colon. Biopsied.  - Diverticulosis in the left colon. There was no evidence of diverticular bleeding.  - Erythematous mucosa in the rectum. Biopsied.  - One 3 mm polyp in the descending colon, removed with a cold biopsy forceps. Resected and retrieved.  - Non- bleeding external and internal hemorrhoids.  - Sacral decubitus stage 2 or stage 3 with clear discharge  RECOMMENDATIONS  -Restart diet  - Await pathology results.  - Repeat upper endoscopy in 12 weeks for surveillance based on pathology results.  - Use Prilosec ( omeprazole) 40 mg PO daily daily. - Repeat colonoscopy in 5 years for surveillance.  - Return to GI clinic on 03/31/2023 with Lewie Loron NP for management of PUD and Cirrhosis with elevated liver enzymes. Patient denies ETOH use currently and continues to have elevated liver enzymes which needs to be further investigated as outpatient (  check anti-smooth muscle antibody.AFP ) -Outpatient MRI abdomen with MRCP for CBD dilation  - Avoid constipation and NSAID  - Surgery evaluation for sacral decubitus which can be pursed as outpatient -Please recall GI with any further questions   Vista Lawman, MD Gastroenterology and Hepatology [facility] Gastroenterology

## 2023-03-10 NOTE — Discharge Instructions (Addendum)

## 2023-03-10 NOTE — Progress Notes (Signed)
   03/10/23 1447  TOC Brief Assessment  Insurance and Status Reviewed  Patient has primary care physician Yes  Home environment has been reviewed From home  Prior level of function: Independent  Prior/Current Home Services No current home services  Social Determinants of Health Reivew SDOH reviewed no interventions necessary  Readmission risk has been reviewed Yes (Yellow)  Transition of care needs no transition of care needs at this time

## 2023-03-10 NOTE — Op Note (Signed)
[facility] Patient Name: Alyssa Carey Procedure Date: 03/10/2023 9:48 AM MRN: [id number] Date of Birth: 1970-08-16 Attending MD: Sanjuan Dame , MD, [id number] CSN: [id number] Age: 52 Admit Type: Inpatient Procedure:                Colonoscopy Indications:              Hematochezia Providers:                Sanjuan Dame, MD, Nena Polio, RN, Cyril Mourning, Technician Referring MD:              Medicines:                Monitored Anesthesia Care Complications:            No immediate complications. Estimated Blood Loss:     Estimated blood loss was minimal. Procedure:                Pre-Anesthesia Assessment:                           - Prior to the procedure, a History and Physical                            was performed, and patient medications and                            allergies were reviewed. The patient's tolerance of                            previous anesthesia was also reviewed. The risks                            and benefits of the procedure and the sedation                            options and risks were discussed with the patient.                            All questions were answered, and informed consent                            was obtained. Prior Anticoagulants: The patient has                            taken no anticoagulant or antiplatelet agents. ASA                            Grade Assessment: III - A patient with severe                            systemic disease. After reviewing the risks and  benefits, the patient was deemed in satisfactory                            condition to undergo the procedure.                           After obtaining informed consent, the colonoscope                            was passed under direct vision. Throughout the                            procedure, the patient's blood pressure, pulse, and                            oxygen saturations were  monitored continuously. The                            [id number]) scope was introduced through the                            anus and advanced to the the terminal ileum. The                            colonoscopy was performed without difficulty. The                            patient tolerated the procedure well. The quality                            of the bowel preparation was evaluated using the                            BBPS [facility] Bowel Preparation Scale) with scores                            of: Right Colon = 2 (minor amount of residual                            staining, small fragments of stool and/or opaque                            liquid, but mucosa seen well), Transverse Colon = 2                            (minor amount of residual staining, small fragments                            of stool and/or opaque liquid, but mucosa seen                            well) and Left Colon = 2 (minor amount of residual  staining, small fragments of stool and/or opaque                            liquid, but mucosa seen well). The total BBPS score                            equals 6. The terminal ileum, ileocecal valve,                            appendiceal orifice, and rectum were photographed. Scope In: 10:17:10 AM Scope Out: 10:40:25 AM Scope Withdrawal Time: 0 hours 18 minutes 44 seconds  Total Procedure Duration: 0 hours 23 minutes 15 seconds  Findings:      The terminal ileum appeared normal.      A few non-bleeding erosions were found in the sigmoid colon. No stigmata       of recent bleeding were seen. Biopsies were taken with a cold forceps       for histology.      Diverticula were found in the left colon. There was no evidence of       diverticular bleeding.      An area of mildly erythematous mucosa was found in the rectum. This was       biopsied with a cold forceps for histology.      A 3 mm polyp was found in the descending colon.  The polyp was sessile.       The polyp was removed with a cold biopsy forceps. Resection and       retrieval were complete.      Non-bleeding external and internal hemorrhoids were found during       retroflexion. Impression:               - The examined portion of the ileum was normal.                           - A few erosions in the sigmoid colon. Biopsied.                           - Diverticulosis in the left colon. There was no                            evidence of diverticular bleeding.                           - Erythematous mucosa in the rectum. Biopsied.                           - One 3 mm polyp in the descending colon, removed                            with a cold biopsy forceps. Resected and retrieved.                           - Non-bleeding external and internal hemorrhoids.                           -  Sacral decubitus stage 2 or stage 3 with clear                            discharge Moderate Sedation:      Per Anesthesia Care Recommendation:           - Repeat colonoscopy in 5 years for surveillance.                           - Return to GI clinic in 4 weeks for management of                            PUD and Cirrhosis with elevated liver enzymes with                            Dr. Marletta Lor or APP.                           - Await pathology results.                           - Avoid constipation and NSAID .                           - Surgery evaluation for sacral decubitus which can                            be pursed as outpatient Procedure Code(s):        --- Professional ---                           [phone], Colonoscopy, flexible; with biopsy, single                            or multiple Diagnosis Code(s):        --- Professional ---                           K64.8, Other hemorrhoids                           K63.3, Ulcer of intestine                           K62.89, Other specified diseases of anus and rectum                           D12.4, Benign neoplasm  of descending colon                           K92.1, Melena (includes Hematochezia)                           K57.30, Diverticulosis of large intestine without                            perforation or abscess without  bleeding CPT copyright 2022 [employer]. All rights reserved. The codes documented in this report are preliminary and upon coder review may  be revised to meet current compliance requirements. Sanjuan Dame, MD Sanjuan Dame, MD 03/10/2023 11:02:10 AM This report has been signed electronically. Number of Addenda: 0

## 2023-03-10 NOTE — Op Note (Signed)
[facility] Patient Name: Alyssa Carey Procedure Date: 03/10/2023 9:49 AM MRN: [id number] Date of Birth: March 09, 1971 Attending MD: Sanjuan Dame , MD, [id number] CSN: [id number] Age: 52 Admit Type: Inpatient Procedure:                Upper GI endoscopy Indications:              Hematochezia Providers:                Sanjuan Dame, MD, Nena Polio, RN, Cyril Mourning, Technician Referring MD:              Medicines:                Monitored Anesthesia Care Complications:            No immediate complications. Estimated Blood Loss:     Estimated blood loss: none. Procedure:                After obtaining informed consent, the endoscope was                            passed under direct vision. Throughout the                            procedure, the patient's blood pressure, pulse, and                            oxygen saturations were monitored continuously. The                            [id number]-[id number] ([id number] scope was introduced through the                            mouth, and advanced to the second part of duodenum.                            The upper GI endoscopy was accomplished without                            difficulty. The patient tolerated the procedure                            well. Scope In: 10:05:30 AM Scope Out: 10:10:20 AM Total Procedure Duration: 0 hours 4 minutes 50 seconds  Findings:      The examined esophagus was normal.      The Z-line was regular.      One non-bleeding gastric ulcer with a clean ulcer base (Forrest Class       III) was found in the gastric antrum. The lesion was 7 mm in largest       dimension. Biopsies were taken with a cold forceps for histology.      The duodenal bulb and second portion of the duodenum were normal. Impression:               - Normal esophagus.                           -  Z-line regular.                           - Non-bleeding gastric ulcer with a clean ulcer                             base (Forrest Class III). Biopsied.                           - Normal duodenal bulb and second portion of the                            duodenum. Moderate Sedation:      Per Anesthesia Care Recommendation:           - Await pathology results.                           - Repeat upper endoscopy in 12 weeks for                            surveillance based on pathology results.                           - Use Prilosec (omeprazole) 40 mg PO daily daily. Procedure Code(s):        --- Professional ---                           [phone], Esophagogastroduodenoscopy, flexible,                            transoral; with biopsy, single or multiple Diagnosis Code(s):        --- Professional ---                           K25.9, Gastric ulcer, unspecified as acute or                            chronic, without hemorrhage or perforation                           K92.1, Melena (includes Hematochezia) CPT copyright 2022 American [employer] Association. All rights reserved. The codes documented in this report are preliminary and upon coder review may  be revised to meet current compliance requirements. Sanjuan Dame, MD Sanjuan Dame, MD 03/10/2023 10:48:36 AM This report has been signed electronically. Number of Addenda: 0

## 2023-03-10 NOTE — Anesthesia Preprocedure Evaluation (Signed)
Anesthesia Evaluation  Patient identified by MRN, date of birth, ID band Patient awake    Reviewed: Allergy & Precautions, H&P , NPO status , Patient's Chart, lab work & pertinent test results, reviewed documented beta blocker date and time   Airway Mallampati: II  TM Distance: >3 FB Neck ROM: full    Dental no notable dental hx.    Pulmonary neg pulmonary ROS   Pulmonary exam normal breath sounds clear to auscultation       Cardiovascular Exercise Tolerance: Good hypertension, negative cardio ROS  Rhythm:regular Rate:Normal     Neuro/Psych negative neurological ROS  negative psych ROS   GI/Hepatic negative GI ROS, Neg liver ROS, PUD,,,(+) Hepatitis -  Endo/Other  negative endocrine ROS    Renal/GU Renal diseasenegative Renal ROS  negative genitourinary   Musculoskeletal   Abdominal   Peds  Hematology negative hematology ROS (+) Blood dyscrasia, anemia   Anesthesia Other Findings   Reproductive/Obstetrics negative OB ROS                             Anesthesia Physical Anesthesia Plan  ASA: 4 and emergent  Anesthesia Plan: General   Post-op Pain Management:    Induction:   PONV Risk Score and Plan: Propofol infusion  Airway Management Planned:   Additional Equipment:   Intra-op Plan:   Post-operative Plan:   Informed Consent: I have reviewed the patients History and Physical, chart, labs and discussed the procedure including the risks, benefits and alternatives for the proposed anesthesia with the patient or authorized representative who has indicated his/her understanding and acceptance.     Dental Advisory Given  Plan Discussed with: CRNA  Anesthesia Plan Comments:        Anesthesia Quick Evaluation

## 2023-03-10 NOTE — Discharge Summary (Signed)
Physician Discharge Summary  Alyssa Carey ENI:[id number] DOB: 1971-02-05  PCP: Richardean Chimera, MD  Admitted from: Home Discharged to: Home  Admit date: 03/09/2023 Discharge date: 03/10/2023  Recommendations for Outpatient Follow-up:    Follow-up Information     Richardean Chimera, MD. Schedule an appointment as soon as possible for a visit in 1 week(s).   Specialty: Family Medicine Why: To be seen with repeat labs (CBC & CMP). Contact information: [address] [place] [address] [phone]         Gelene Mink, NP Follow up on 04/03/2023.   Specialty: Gastroenterology Why: 8:30am. Contact information: [address] [place] [address] [phone]                  Home Health: None    Equipment/Devices: None    Discharge Condition: Improved and stable.   Code Status: Full Code Diet recommendation:  Discharge Diet Orders (From admission, onward)     Start     Ordered   03/10/23 0000  Diet - low sodium heart healthy        03/10/23 1626             Discharge Diagnoses:  Principal Problem:   Acute blood loss anemia Active Problems:   Gastrointestinal hemorrhage   HTN (hypertension)   Peptic ulcer disease   Abnormal liver enzymes   Hypokalemia   Hyponatremia   History of colostomy reversal   Common bile duct dilation   Grade II hemorrhoids   Diverticulosis of colon without hemorrhage   Noninfectious gastroenteritis   Gastritis and gastroduodenitis   Brief Summary: 52 year old female with PMH of necrotizing fasciitis s/p diverting colostomy June 2021 complicated by parastomal hernia, SBO Jan/February 2023, previous GI bleed due to PUD, July 2023 underwent lap assisted colostomy takedown and partial colectomy, alcohol use disorder in remission, compensated cirrhosis, recent hospitalization and discharged on 01/19/2023 following upper GI bleed secondary to PUD, presented to the ED on 03/09/2023 with diarrhea and multiple episodes of fresh  blood per rectum.  She denied lightheadedness or dizziness.  She had some left lower quadrant abdominal pain and received meds for same in the ED.  GI was consulted and underwent EGD and colonoscopy.  Assessment and plan:  Acute GI bleed: Mostly acute lower GI bleed but there was some report of melena. Remained hemodynamically stable.  Hemoglobin stable. GI was consulted and she underwent EGD and colonoscopy with findings as noted below.  No overt bleeding noted. Postprocedure, as per GI recommendations, after she tolerated diet, she was discharged home on omeprazole 40 Mg daily. Patient has upcoming GI follow-up on 04/03/2023  Chronic anemia: Appears stable from prior. During recent discharge from the hospital on 7/5, hemoglobin was 8.6, up to 11 on 8/15.  Presented with hemoglobin of 11.2. This slightly dropped but some of this drop may be hemodilutional as well. Stable.  No overt bleeding found on evaluation above Follow CBC as outpatient.  Hyponatremia: Suspect due to liver cirrhosis.  Serum sodium improved from 1 27-1 33. Not volume overloaded.  Outpatient follow-up.  Compensated cirrhosis/abnormal LFTs Mild transaminitis.  Total bilirubin was 1 but went up to 1.5. AST and ALT appear to be chronically elevated Outpatient follow-up.  Stage II sacral decubitus ulcer: Patient is ambulatory and stay that she is up on her feet 8 hours a day working. She reports that the ulcer is due to recent diarrhea. Ulcer is clean.  Recommended not to sit or lay  on her buttocks for extended period of time, may use a donut if plans to sit for prolonged.'s, topical hygiene and attempt to keep it dry and seek immediate medical attention if worsening pain, purulent drainage, fever etc. and she verbalized understanding. Patient does have an upcoming surgery appointment on 8/28 and this can be evaluated.  Consultations: GI  Procedures: EGD - Normal esophagus. - Z- line regular. - Non- bleeding  gastric ulcer with a clean ulcer base ( Forrest Class III) . Biopsied. - Normal duodenal bulb and second portion of the duodenum.  Colonoscopy:   The examined portion of the ileum was normal.  - A few erosions in the sigmoid colon. Biopsied.  - Diverticulosis in the left colon. There was no evidence of diverticular bleeding.  - Erythematous mucosa in the rectum. Biopsied.  - One 3 mm polyp in the descending colon, removed with a cold biopsy forceps. Resected and retrieved.  - Non- bleeding external and internal hemorrhoids.  - Sacral decubitus stage 2 or stage 3 with clear discharge  RECOMMENDATIONS   -Restart diet  - Await pathology results.  - Repeat upper endoscopy in 12 weeks for surveillance based on pathology results.  - Use Prilosec ( omeprazole) 40 mg PO daily daily. - Repeat colonoscopy in 5 years for surveillance.  - Return to GI clinic on 03/31/2023 with Lewie Loron NP for management of PUD and Cirrhosis with elevated liver enzymes. Patient denies ETOH use currently and continues to have elevated liver enzymes which needs to be further investigated as outpatient (  check anti-smooth muscle antibody.AFP ) -Outpatient MRI abdomen with MRCP for CBD dilation  - Avoid constipation and NSAID  - Surgery evaluation for sacral decubitus which can be pursed as outpatient -Please recall GI with any further questions    Discharge Instructions  Discharge Instructions     Call MD for:   Complete by: As directed    Vomiting blood or coffee ground material, rectal bleeding or black tarry stools.   Call MD for:  difficulty breathing, headache or visual disturbances   Complete by: As directed    Call MD for:  extreme fatigue   Complete by: As directed    Call MD for:  persistant dizziness or light-headedness   Complete by: As directed    Call MD for:  persistant nausea and vomiting   Complete by: As directed    Call MD for:  redness, tenderness, or signs of infection (pain, swelling,  redness, odor or green/yellow discharge around incision site)   Complete by: As directed    Call MD for:  severe uncontrolled pain   Complete by: As directed    Call MD for:  temperature >100.4   Complete by: As directed    Diet - low sodium heart healthy   Complete by: As directed    Increase activity slowly   Complete by: As directed         Medication List     STOP taking these medications    famotidine 20 MG tablet Commonly known as: PEPCID       TAKE these medications    fluconazole 150 MG tablet Commonly known as: DIFLUCAN Take 150 mg by mouth every 3 (three) days.   fluticasone 50 MCG/ACT nasal spray Commonly known as: FLONASE Place 2 sprays into both nostrils daily.   gabapentin 100 MG capsule Commonly known as: NEURONTIN Take 1 capsule (100 mg total) by mouth at bedtime as needed (pain).   Gerhardt's butt  cream Crea Apply 1 Application topically 4 (four) times daily as needed for irritation.   hydrocortisone 2.5 % rectal cream Commonly known as: ANUSOL-HC Place 1 Application rectally 2 (two) times daily.   hydrOXYzine 25 MG tablet Commonly known as: ATARAX Take 25 mg by mouth as needed for itching.   Ipratropium-Albuterol 20-100 MCG/ACT Aers respimat Commonly known as: COMBIVENT Inhale 1 puff into the lungs every 6 (six) hours as needed for wheezing or shortness of breath.   loratadine 10 MG tablet Commonly known as: CLARITIN Take 1 tablet (10 mg total) by mouth daily.   montelukast 10 MG tablet Commonly known as: SINGULAIR Take 10 mg by mouth at bedtime.   omeprazole 40 MG capsule Commonly known as: PRILOSEC Take 1 capsule (40 mg total) by mouth daily. What changed: when to take this       Allergies  Allergen Reactions   Pantoprazole     Stomach pain   Penicillins Hives   Oxycodone Hcl Rash      Procedures/Studies: CT ABDOMEN PELVIS W CONTRAST  Result Date: 03/09/2023 CLINICAL DATA:  Lower GI bleed. EXAM: CT ABDOMEN AND  PELVIS WITH CONTRAST TECHNIQUE: Multidetector CT imaging of the abdomen and pelvis was performed using the standard protocol following bolus administration of intravenous contrast. RADIATION DOSE REDUCTION: This exam was performed according to the departmental dose-optimization program which includes automated exposure control, adjustment of the mA and/or kV according to patient size and/or use of iterative reconstruction technique. CONTRAST:  OMNIPAQUE IOHEXOL 300 MG/ML  SOLN COMPARISON:  CT abdomen pelvis dated 11/23/2022. FINDINGS: Lower chest: The visualized lung bases are clear. No intra-abdominal free air or free fluid. Hepatobiliary: Fatty liver. Slight irregularity of the liver contour with enlargement of the left lobe of the liver in keeping with morphologic changes of cirrhosis. No biliary dilatation. Small gallstone. No pericholecystic fluid or evidence of acute cholecystitis by CT. Pancreas: Unremarkable. No pancreatic ductal dilatation or surrounding inflammatory changes. Spleen: Normal in size without focal abnormality. Adrenals/Urinary Tract: The adrenal glands are unremarkable. Small left renal inferior pole exophytic cyst. There is no hydronephrosis on either side. There is symmetric enhancement and excretion of contrast by both kidneys. The visualized ureters and urinary bladder appear unremarkable. Stomach/Bowel: Postsurgical changes of partial sigmoid resection with anastomotic staple line. There is abutment of several loops of small bowel to the left anterior pelvic wall consistent with adhesions. There is protrusion of a short segment of small bowel into the subcutaneous tissues. There is no bowel obstruction or active inflammation. The appendix is normal. Vascular/Lymphatic: The abdominal aorta and IVC are unremarkable. No portal venous gas. There is no adenopathy. Reproductive: The uterus is anteverted and grossly unremarkable. No adnexal masses. Other: Postsurgical changes of the  posterior perineum and perianal region. No fluid collection. Musculoskeletal: No acute osseous pathology. IMPRESSION: 1. No acute intra-abdominal or pelvic pathology. 2. Postsurgical changes of partial sigmoid resection with anastomotic staple line. No bowel obstruction. Normal appendix. 3. Fatty liver with morphologic changes of cirrhosis. 4. Cholelithiasis. Electronically Signed   By: Elgie Collard M.D.   On: 03/09/2023 22:20   US ABDOMEN COMPLETE W/ELASTOGRAPHY  Result Date: 03/07/2023 CLINICAL DATA:  Cirrhosis.  History of fatty liver. EXAM: ULTRASOUND ABDOMEN ULTRASOUND HEPATIC ELASTOGRAPHY TECHNIQUE: Sonography of the upper abdomen was performed. In addition, ultrasound elastography evaluation of the liver was performed. A region of interest was placed within the right lobe of the liver. Following application of a compressive sonographic pulse, tissue compressibility was assessed. Multiple  assessments were performed at the selected site. Median tissue compressibility was determined. Previously, hepatic stiffness was assessed by shear wave velocity. Based on recently published Society of Radiologists in Ultrasound consensus article, reporting is now recommended to be performed in the SI units of pressure (kiloPascals) representing hepatic stiffness/elasticity. The obtained result is compared to the published reference standards. (cACLD = compensated Advanced Chronic Liver Disease) COMPARISON:  CT 11/23/2022 FINDINGS: ULTRASOUND ABDOMEN Gallbladder: Distended gallbladder. No wall thickening or adjacent fluid. Stone seen dependently in the gallbladder in the prior CT are not well seen on this ultrasound. Common bile duct: Diameter: 11 mm, increased from previous. Liver: Diffusely echogenic hepatic parenchyma consistent with fatty liver infiltration. Please correlate with the prior contrast exam. Portal vein is patent on color Doppler imaging with normal direction of blood flow towards the liver. IVC: No  abnormality visualized. Pancreas: Visualized portion unremarkable. Spleen: Size and appearance within normal limits. Right Kidney: Length: 9.7 cm. Echogenicity within normal limits. No mass or hydronephrosis visualized. Left Kidney: Length: 10.6 cm. Echogenicity within normal limits. No mass or hydronephrosis visualized. Abdominal aorta: No aneurysm visualized. Other findings: Some limitation with breathing motion as per the sonographer. ULTRASOUND HEPATIC ELASTOGRAPHY Device: Siemens Helix VTQ Patient position: Oblique Transducer DAX Number of measurements: 10 Hepatic segment:  8 Median kPa: 6.7 IQR: 2.6 IQR/Median kPa ratio: 0.39 Data quality: IQR/Median kPa ratio of 0.3 or greater indicates reduced accuracy Diagnostic category: < or = 9 kPa: in the absence of other known clinical signs, rules out cACLD The use of hepatic elastography is applicable to patients with viral hepatitis and non-alcoholic fatty liver disease. At this time, there is insufficient data for the referenced cut-off values and use in other causes of liver disease, including alcoholic liver disease. Patients, however, may be assessed by elastography and serve as their own reference standard/baseline. In patients with non-alcoholic liver disease, the values suggesting compensated advanced chronic liver disease (cACLD) may be lower, and patients may need additional testing with elasticity results of 7-9 kPa. Please note that abnormal hepatic elasticity and shear wave velocities may also be identified in clinical settings other than with hepatic fibrosis, such as: acute hepatitis, elevated right heart and central venous pressures including use of beta blockers, veno-occlusive disease (Budd-Chiari), infiltrative processes such as mastocytosis/amyloidosis/infiltrative tumor/lymphoma, extrahepatic cholestasis, with hyperemia in the post-prandial state, and with liver transplantation. Correlation with patient history, laboratory data, and clinical  condition recommended. Diagnostic Categories: < or =5 kPa: high probability of being normal < or =9 kPa: in the absence of other known clinical signs, rules out cACLD >9 kPa and ?13 kPa: suggestive of cACLD, but needs further testing >13 kPa: highly suggestive of cACLD > or =17 kPa: highly suggestive of cACLD with an increased probability of clinically significant portal hypertension IMPRESSION: ULTRASOUND ABDOMEN: Fatty liver infiltration. Gallstone seen by prior CT are not well identified on this ultrasound. There is dilatation of the common duct more prominent than the prior CT. Recommend further evaluation such as MRCP when appropriate please correlate with symptoms ULTRASOUND HEPATIC ELASTOGRAPHY: Median kPa:  6.7 Diagnostic category: < or = 9 kPa: in the absence of other known clinical signs, rules out cACLD Electronically Signed   By: Karen Kays M.D.   On: 03/07/2023 17:54      Subjective: Seen this morning postprocedure.  Denied abdominal pain but was complaining more of pain at buttock ulcer site.  Since then RN has communicated indicating that patient has tolerated some diet.  Discharge Exam:  Vitals:   03/10/23 1045 03/10/23 1100 03/10/23 1120 03/10/23 1215  BP: (!) 129/96 125/81 (!) 144/81 (!) 106/93  Pulse: 96 86 76   Resp: 20 (!) 25 (!) 21 18  Temp: 97.9 F (36.6 C)  98.2 F (36.8 C) 97.8 F (36.6 C)  TempSrc:   Oral Oral  SpO2: 100% 100% 100% 100%  Weight:      Height:       Patient was interviewed and examined along with her female Charity fundraiser and female nursing tech in room as chaperones.  General: Young female, moderately built and obese lying comfortably propped up in bed without distress. Cardiovascular: S1 & S2 heard, RRR, S1/S2 +. No murmurs, rubs, gallops or clicks. No JVD or pedal edema. Respiratory: Clear to auscultation without wheezing, rhonchi or crackles. No increased work of breathing. Abdominal:  Non distended, non tender & soft. No organomegaly or masses  appreciated. Normal bowel sounds heard. CNS: Alert and oriented. No focal deficits. Extremities: no edema, no cyanosis Skin approximately 2 to 3 mm vertically oblong stage II ulcer in the lower aspect of the gluteal cleft just superior to the anal orifice without acute findings or drainage.    The results of significant diagnostics from this hospitalization (including imaging, microbiology, ancillary and laboratory) are listed below for reference.     Microbiology: No results found for this or any previous visit (from the past 240 hour(s)).   Labs: CBC: Recent Labs  Lab 03/09/23 1520 03/09/23 1958 03/09/23 2129 03/10/23 0442 03/10/23 0839  WBC 6.0  --  3.8* 4.0 4.4  NEUTROABS 4.0  --   --   --   --   HGB 11.2* 9.8* 10.0* 10.3* 9.2*  HCT 36.4 30.9* 30.9* 32.6* 29.6*  MCV 86.5  --  83.7 84.9 85.8  PLT 149*  --  223 193 171    Basic Metabolic Panel: Recent Labs  Lab 03/09/23 1520 03/10/23 0442  NA 127* 133*  K 3.9 3.7  CL 95* 102  CO2 19* 20*  GLUCOSE 91 73  BUN <5* <5*  CREATININE 0.44 0.47  CALCIUM 8.6* 8.5*    Liver Function Tests: Recent Labs  Lab 03/09/23 1520 03/10/23 0442  AST 119* 189*  ALT 53* 51*  ALKPHOS 219* 206*  BILITOT 1.0 1.5*  PROT 8.3* 7.1  ALBUMIN 3.7 3.3*     Time coordinating discharge: 25 minutes  SIGNED:  Marcellus Scott, MD,  FACP, FHM, SFHM, [facility], [facility]   [facility] [place]      To contact the attending provider between 7A-7P or the covering provider during after hours 7P-7A, please log into the web site [email or web address] and access using universal [place] password for that web site. If you do not have the password, please call the hospital operator.

## 2023-03-10 NOTE — Interval H&P Note (Signed)
History and Physical Interval Note:  03/10/2023 9:58 AM  Alyssa Carey  has presented today for surgery, with the diagnosis of GI bleed, History of peptic ulcer disease and rectal ulcer.  The various methods of treatment have been discussed with the patient and family. After consideration of risks, benefits and other options for treatment, the patient has consented to  Procedure(s): ESOPHAGOGASTRODUODENOSCOPY (EGD) WITH PROPOFOL (N/A) COLONOSCOPY WITH PROPOFOL (N/A) as a surgical intervention.  The patient's history has been reviewed, patient examined, no change in status, stable for surgery.  I have reviewed the patient's chart and labs.  Questions were answered to the patient's satisfaction.     Juanetta Beets [name]

## 2023-03-10 NOTE — H&P (View-Only) (Signed)
Vista Lawman, M.D. Gastroenterology & Hepatology                                           Patient Name: Alyssa Carey  MRN: [id number] Admission Date: 03/09/2023 Date of Evaluation:  03/10/2023 Time of Evaluation: 9:38 AM  Chief Complaint:  Fresh blood per rectum   HPI:  This is a 52 y.o. female with history of necrotizing fasciitis s/p diverting colostomy June 2021 complicated by parastomal hernia, small bowel obstruction January/February 2023, previous GI bleed due to peptic ulcer disease, July 2023 she underwent lap assisted colostomy takedown and partial colectomy by Dr. Maisie Fus, compensated cirrhosis presented to the ER with fresh blood per rectum.  GI is consulted for lower GI bleed  Patient had recent hospitalization and discharged on 01/19/2023 with upper GI bleed secondary to peptic ulcer disease.  She is presenting today again with diarrhea and multiple episodes of fresh blood per rectum.  Patient denies any shortness of breath or dizziness but reports left lower quadrant abdominal pain.  Patient denies taking any NSAIDs.  Per patient she had at least 2 large bowel movement with fresh blood.  Although this morning bowel movement was slightly black and tarry   Endoscopic history: 01/17/2023  Impression: - Normal esophagus. Multiple prepyloric gastric ulcers and erosions. No bleeding stigmata. - Normal duodenal bulb and second   03/2021 Impression: - Preparation of the colon was fair. - The rectum is normal. - No specimens collected. - The rectum appeared normal. Previously noted rectal ulcer has healed. Mucosa was quite friable with evidence of minor barotrauma with evaluation of the rectum itself. All air was suctioned out as scope was withdrawn through the remaining colon. No active colitis. No polyps or evidence of cancer identified.   Past Medical History: SEE CHRONIC ISSSUES: Past Medical History:  Diagnosis Date   Alcohol abuse    HTN (hypertension) 10/05/2020    Necrotizing fasciitis [facility])    Past Surgical History:  Past Surgical History:  Procedure Laterality Date   BIOPSY  10/07/2020   Procedure: BIOPSY;  Surgeon: Corbin Ade, MD;  [facility]: [facility];  Service: Endoscopy;;   COLONOSCOPY WITH PROPOFOL N/A 10/07/2020   single healing rectal ulcer in distal rectum with surrounding mucosal friability and markedly abnormal proximal colon with ulceration s/p biopsies.   COLONOSCOPY WITH PROPOFOL  02/22/2021   Procedure: COLONOSCOPY WITH PROPOFOL;  Surgeon: Lanelle Bal, DO;  [facility]: [facility];  Service: Endoscopy;;   COLOSTOMY TAKEDOWN N/A 01/26/2022   Procedure: LAPAROSCOPIC COLOSTOMY TAKEDOWN;  Surgeon: Romie Levee, MD;  Location: [facility];  Service: General;  Laterality: N/A;  laparoscopic assisted colostomy reversal and partial colectomy   ESOPHAGOGASTRODUODENOSCOPY (EGD) WITH PROPOFOL N/A 10/07/2020   non-bleeding gastric ulcer . Pathology with H.pylori negative   ESOPHAGOGASTRODUODENOSCOPY (EGD) WITH PROPOFOL N/A 12/27/2020   Procedure: ESOPHAGOGASTRODUODENOSCOPY (EGD) WITH PROPOFOL;  Surgeon: Lanelle Bal, DO;  [facility]: [facility];  Service: Endoscopy;  Laterality: N/A;  1:00pm   ESOPHAGOGASTRODUODENOSCOPY (EGD) WITH PROPOFOL N/A 01/17/2023   Procedure: ESOPHAGOGASTRODUODENOSCOPY (EGD) WITH PROPOFOL;  Surgeon: Corbin Ade, MD;  [facility]: [facility];  Service: Endoscopy;  Laterality: N/A;   FLEXIBLE SIGMOIDOSCOPY N/A 01/11/2020   Procedure: FLEXIBLE SIGMOIDOSCOPY;  Surgeon: Jeani Hawking, MD;  Location: [facility];  Service: Gastroenterology;  Laterality: N/A;   INCISION AND DRAINAGE PERIRECTAL ABSCESS N/A 12/25/2019  Procedure: IRRIGATION AND DEBRIDEMENT BUTTOCKS, LAP LOOP COLOSTOMY;  Surgeon: Gaynelle Adu, MD;  Location: [facility];  Service: General;  Laterality: N/A;   IRRIGATION AND DEBRIDEMENT ABSCESS N/A 12/23/2019   Procedure: EXCISION AND DEBRIDEMENT LEFT BUTTOCK AND PERINEUM;  Surgeon: Berna Bue,  MD;  Location: [facility];  Service: General;  Laterality: N/A;   LAPAROSCOPIC LOOP COLOSTOMY N/A 12/25/2019   Procedure: LAPAROSCOPIC LOOP COLOSTOMY;  Surgeon: Gaynelle Adu, MD;  Location: [facility];  Service: General;  Laterality: N/A;   TONSILLECTOMY     Family History:  Family History  Problem Relation Age of Onset   Colon polyps Mother        does not believe adenomas   Arrhythmia Mother    Colon cancer Neg Hx    Social History:  Social History   Tobacco Use   Smoking status: Never    Passive exposure: Never   Smokeless tobacco: Never  Vaping Use   Vaping status: Never Used  Substance Use Topics   Alcohol use: Not Currently   Drug use: Not Currently    Home Medications:  Prior to Admission medications   Medication Sig Start Date End Date Taking? Authorizing Provider  cholecalciferol (VITAMIN D3) 25 MCG (1000 UNIT) tablet Take 1,000 Units by mouth daily.    [provider]  famotidine (PEPCID) 20 MG tablet Take 20 mg by mouth daily. 11/11/21   [provider]  fluticasone (FLONASE) 50 MCG/ACT nasal spray Place 2 sprays into both nostrils daily. 09/15/22   [provider]  gabapentin (NEURONTIN) 100 MG capsule Take 1 capsule (100 mg total) by mouth at bedtime as needed (pain). 11/13/22   Vassie Loll, MD  hydrocortisone (ANUSOL-HC) 2.5 % rectal cream Place 1 Application rectally 2 (two) times daily. 12/05/22   Gelene Mink, NP  hydrOXYzine (ATARAX) 25 MG tablet Take 25 mg by mouth as needed for itching. 09/28/22   [provider]  Ipratropium-Albuterol (COMBIVENT) 20-100 MCG/ACT AERS respimat Inhale 1 puff into the lungs every 6 (six) hours as needed for wheezing or shortness of breath. 11/13/22   Vassie Loll, MD  loratadine (CLARITIN) 10 MG tablet Take 1 tablet (10 mg total) by mouth daily. 11/14/22   Vassie Loll, MD  montelukast (SINGULAIR) 10 MG tablet Take 10 mg by mouth at bedtime. 09/19/22   [provider]  Nystatin (GERHARDT'S  BUTT CREAM) CREA Apply 1 Application topically 4 (four) times daily as needed for irritation. 11/13/22   Vassie Loll, MD  omeprazole (PRILOSEC) 40 MG capsule Take 1 capsule (40 mg total) by mouth 2 (two) times daily. 01/19/23   Tyrone Nine, MD  vitamin C (ASCORBIC ACID) 500 MG tablet Take 500 mg by mouth daily.    [provider]    Inpatient Medications:  Current Facility-Administered Medications:    0.9 %  sodium chloride infusion, , Intravenous, Continuous, Hongalgi, Anand D, MD, Last Rate: 125 mL/hr at 03/10/23 0916, Rate Change at 03/10/23 0916   0.9 %  sodium chloride infusion, , Intravenous, Continuous, Tulip Meharg, Juanetta Beets, MD   famotidine (PEPCID) tablet 20 mg, 20 mg, Oral, Daily, Levie Heritage, DO, 20 mg at 03/10/23 0840   gabapentin (NEURONTIN) capsule 100 mg, 100 mg, Oral, QHS PRN, Levie Heritage, DO, 100 mg at 03/09/23 2255   HYDROcodone-acetaminophen (NORCO/VICODIN) 5-325 MG per tablet 1 tablet, 1 tablet, Oral, Q4H PRN, Levie Heritage, DO, 1 tablet at 03/09/23 2255   hydrOXYzine (ATARAX) tablet 25 mg, 25 mg, Oral,  PRN, Levie Heritage, DO, 25 mg at 03/09/23 2255   ondansetron (ZOFRAN) tablet 4 mg, 4 mg, Oral, Q6H PRN **OR** ondansetron (ZOFRAN) injection 4 mg, 4 mg, Intravenous, Q6H PRN, Stinson, Jacob J, DO   pantoprazole (PROTONIX) EC tablet 40 mg, 40 mg, Oral, Daily, Levie Heritage, DO, 40 mg at 03/10/23 0840   pneumococcal 20-valent conjugate vaccine (PREVNAR 20) injection 0.5 mL, 0.5 mL, Intramuscular, Tomorrow-1000, Stinson, Jacob J, DO Allergies: Pantoprazole, Penicillins, and Oxycodone hcl  Complete Review of Systems: GENERAL: negative for malaise, night sweats HEENT: No changes in hearing or vision, no nose bleeds or other nasal problems. NECK: Negative for lumps, goiter, pain and significant neck swelling RESPIRATORY: Negative for cough, wheezing CARDIOVASCULAR: Negative for chest pain, leg swelling, palpitations, orthopnea GI: SEE  HPI MUSCULOSKELETAL: Negative for joint pain or swelling, back pain, and muscle pain. SKIN: Negative for lesions, rash PSYCH: Negative for sleep disturbance, mood disorder and recent psychosocial stressors. HEMATOLOGY Negative for prolonged bleeding, bruising easily, and swollen nodes. ENDOCRINE: Negative for cold or heat intolerance, polyuria, polydipsia and goiter. NEURO: negative for tremor, gait imbalance, syncope and seizures. The remainder of the review of systems is noncontributory.  Physical Exam: BP (!) 92/49 (BP Location: Left Arm)   Pulse (!) 101   Temp 98.2 F (36.8 C) (Oral)   Resp 17   Ht 4\' 9"  (1.448 m)   Wt 82.4 kg   LMP 11/30/2021   SpO2 100%   BMI 39.30 kg/m  GENERAL: The patient is AO x3, in no acute distress. HEENT: Head is normocephalic and atraumatic. EOMI are intact. Mouth is well hydrated and without lesions. NECK: Supple. No masses LUNGS: Clear to auscultation. No presence of rhonchi/wheezing/rales. Adequate chest expansion HEART: RRR, normal s1 and s2. ABDOMEN: Soft, nontender, no guarding, no peritoneal signs, and nondistended. BS +. No masses. EXTREMITIES: Without any cyanosis, clubbing, rash, lesions or edema. NEUROLOGIC: AOx3, no focal motor deficit. SKIN: no jaundice, no rashes  Laboratory Data CBC:     Component Value Date/Time   WBC 4.4 03/10/2023 0839   RBC 3.45 (L) 03/10/2023 0839   HGB 9.2 (L) 03/10/2023 0839   HCT 29.6 (L) 03/10/2023 0839   PLT 171 03/10/2023 0839   MCV 85.8 03/10/2023 0839   MCH 26.7 03/10/2023 0839   MCHC 31.1 03/10/2023 0839   RDW 17.3 (H) 03/10/2023 0839   LYMPHSABS 1.5 03/09/2023 1520   MONOABS 0.3 03/09/2023 1520   EOSABS 0.1 03/09/2023 1520   BASOSABS 0.0 03/09/2023 1520   COAG:  Lab Results  Component Value Date   INR 1.1 03/09/2023   INR 1.0 12/05/2022   INR 1.1 11/11/2022    BMP:     Latest Ref Rng & Units 03/10/2023    4:42 AM 03/09/2023    3:20 PM 01/17/2023    4:10 AM  BMP  Glucose 70 - 99  mg/dL 73  91  76   BUN 6 - 20 mg/dL <5  <5  14   Creatinine 0.44 - 1.00 mg/dL 4.09  8.11  9.14   Sodium 135 - 145 mmol/L 133  127  135   Potassium 3.5 - 5.1 mmol/L 3.7  3.9  3.6   Chloride 98 - 111 mmol/L 102  95  106   CO2 22 - 32 mmol/L 20  19  21    Calcium 8.9 - 10.3 mg/dL 8.5  8.6  8.3     HEPATIC:     Latest Ref Rng & Units 03/10/2023  4:42 AM 03/09/2023    3:20 PM 01/17/2023    4:10 AM  Hepatic Function  Total Protein 6.5 - 8.1 g/dL 7.1  8.3  6.2   Albumin 3.5 - 5.0 g/dL 3.3  3.7  2.8   AST 15 - 41 U/L 189  119  45   ALT 0 - 44 U/L 51  53  26   Alk Phosphatase 38 - 126 U/L 206  219  129   Total Bilirubin 0.3 - 1.2 mg/dL 1.5  1.0  1.7     CARDIAC: No results found for: "CKTOTAL", "CKMB", "CKMBINDEX", "TROPONINI"   Imaging: I personally reviewed and interpreted the available imaging.  Baseline hemoglobin 9-10.  Initially presented with 11 trended down to 9.2.  BUN/creatinine remain slow  Liver enzymes remains elevated AST 189 ALT 51 alk phos 206 T. bili 1.5  Assessment & Plan:  This is a 52 y.o. female with history of necrotizing fasciitis s/p diverting colostomy June 2021 complicated by parastomal hernia, small bowel obstruction January/February 2023, previous GI bleed due to peptic ulcer disease, July 2023 she underwent lap assisted colostomy takedown and partial colectomy by Dr. Maisie Fus, compensated cirrhosis presented to the ER with fresh blood per rectum.  GI is consulted for lower GI bleed  # Painless hematochezia #PUD  This is likely lower GI bleed from previously seen single rectal ulcer thought to be due to NSAID was ischemic injury.  Last colonoscopy 2022 with friable rectal mucosa Patient is hemodynamically stable and hemoglobin has trended down slightly Last upper endoscopy done around 8 weeks ago with peptic ulcer disease Today patient reports questionable black tarry stools as well  At this time we will proceed with colonoscopy given continued painless  hematochezia  also due to questionable melena, history of peptic ulcer disease and patient due for surveillance of healing of gastric ulcer we will also proceed with upper endoscopy at the same time   #Compensated cirrhosis #Elevated liver enzymes #CBD Dilation   MELD 3.0: 13 at 03/10/2023  4:42 AM MELD-Na: 9 at 03/10/2023  4:42 AM Calculated from: Serum Creatinine: 0.47 mg/dL (Using min of 1 mg/dL) at 10/23/8117  1:47 AM Serum Sodium: 133 mmol/L at 03/10/2023  4:42 AM Total Bilirubin: 1.5 mg/dL at 03/14/5620  3:08 AM Serum Albumin: 3.3 g/dL at 6/57/8469  6:29 AM INR(ratio): 1.1 at 03/09/2023  9:29 PM Age at listing (hypothetical): 51 years Sex: Female at 03/10/2023  4:42 AM  #Eitology :   Likely etiology is alcohol use.  AST more than ALT.last viral hepatitis profile checked in 2022 hep A nonimmune hep B negative and nonimmune hep C negative.  ANA , AMA negative.  Normal alpha-1 antitrypsin  # Hepatic encephalopathy None - Avoid opiates or benzodiazepines  # Ascites None on exam and imaging  # Esophageal varices No varices on recent upper endoscopy  # HCC screening CT done 03/09/2023 without any focal lesion Will check AFP  Recommendation  -Will check anti-smooth muscle antibody.AFP -Patient need to be enrolled in every 6 month HCC screening with ultrasound and AFP -Outpatient MRI abdomen with MRCP for CBD dilation  Vista Lawman, MD Gastroenterology and Hepatology [facility] Gastroenterology  This chart has been completed using [facility] Dictation software, and while attempts have been made to ensure accuracy , certain words and phrases may not be transcribed as intended

## 2023-03-10 NOTE — Consult Note (Signed)
Vista Lawman, M.D. Gastroenterology & Hepatology                                           Patient Name: Alyssa Carey  MRN: [id number] Admission Date: 03/09/2023 Date of Evaluation:  03/10/2023 Time of Evaluation: 9:38 AM  Chief Complaint:  Fresh blood per rectum   HPI:  This is a 52 y.o. female with history of necrotizing fasciitis s/p diverting colostomy June 2021 complicated by parastomal hernia, small bowel obstruction January/February 2023, previous GI bleed due to peptic ulcer disease, July 2023 she underwent lap assisted colostomy takedown and partial colectomy by Dr. Maisie Fus, compensated cirrhosis presented to the ER with fresh blood per rectum.  GI is consulted for lower GI bleed  Patient had recent hospitalization and discharged on 01/19/2023 with upper GI bleed secondary to peptic ulcer disease.  She is presenting today again with diarrhea and multiple episodes of fresh blood per rectum.  Patient denies any shortness of breath or dizziness but reports left lower quadrant abdominal pain.  Patient denies taking any NSAIDs.  Per patient she had at least 2 large bowel movement with fresh blood.  Although this morning bowel movement was slightly black and tarry   Endoscopic history: 01/17/2023  Impression: - Normal esophagus. Multiple prepyloric gastric ulcers and erosions. No bleeding stigmata. - Normal duodenal bulb and second   03/2021 Impression: - Preparation of the colon was fair. - The rectum is normal. - No specimens collected. - The rectum appeared normal. Previously noted rectal ulcer has healed. Mucosa was quite friable with evidence of minor barotrauma with evaluation of the rectum itself. All air was suctioned out as scope was withdrawn through the remaining colon. No active colitis. No polyps or evidence of cancer identified.   Past Medical History: SEE CHRONIC ISSSUES: Past Medical History:  Diagnosis Date   Alcohol abuse    HTN (hypertension) 10/05/2020    Necrotizing fasciitis [facility])    Past Surgical History:  Past Surgical History:  Procedure Laterality Date   BIOPSY  10/07/2020   Procedure: BIOPSY;  Surgeon: Corbin Ade, MD;  [facility]: [facility];  Service: Endoscopy;;   COLONOSCOPY WITH PROPOFOL N/A 10/07/2020   single healing rectal ulcer in distal rectum with surrounding mucosal friability and markedly abnormal proximal colon with ulceration s/p biopsies.   COLONOSCOPY WITH PROPOFOL  02/22/2021   Procedure: COLONOSCOPY WITH PROPOFOL;  Surgeon: Lanelle Bal, DO;  [facility]: [facility];  Service: Endoscopy;;   COLOSTOMY TAKEDOWN N/A 01/26/2022   Procedure: LAPAROSCOPIC COLOSTOMY TAKEDOWN;  Surgeon: Romie Levee, MD;  Location: [facility];  Service: General;  Laterality: N/A;  laparoscopic assisted colostomy reversal and partial colectomy   ESOPHAGOGASTRODUODENOSCOPY (EGD) WITH PROPOFOL N/A 10/07/2020   non-bleeding gastric ulcer . Pathology with H.pylori negative   ESOPHAGOGASTRODUODENOSCOPY (EGD) WITH PROPOFOL N/A 12/27/2020   Procedure: ESOPHAGOGASTRODUODENOSCOPY (EGD) WITH PROPOFOL;  Surgeon: Lanelle Bal, DO;  [facility];  Service: Endoscopy;  Laterality: N/A;  1:00pm   ESOPHAGOGASTRODUODENOSCOPY (EGD) WITH PROPOFOL N/A 01/17/2023   Procedure: ESOPHAGOGASTRODUODENOSCOPY (EGD) WITH PROPOFOL;  Surgeon: Corbin Ade, MD;  [facility];  Service: Endoscopy;  Laterality: N/A;   FLEXIBLE SIGMOIDOSCOPY N/A 01/11/2020   Procedure: FLEXIBLE SIGMOIDOSCOPY;  Surgeon: Jeani Hawking, MD;  Location: [facility];  Service: Gastroenterology;  Laterality: N/A;   INCISION AND DRAINAGE PERIRECTAL ABSCESS N/A 12/25/2019  Procedure: IRRIGATION AND DEBRIDEMENT BUTTOCKS, LAP LOOP COLOSTOMY;  Surgeon: Gaynelle Adu, MD;  Location: [facility];  Service: General;  Laterality: N/A;   IRRIGATION AND DEBRIDEMENT ABSCESS N/A 12/23/2019   Procedure: EXCISION AND DEBRIDEMENT LEFT BUTTOCK AND PERINEUM;  Surgeon: Berna Bue,  MD;  Location: [facility];  Service: General;  Laterality: N/A;   LAPAROSCOPIC LOOP COLOSTOMY N/A 12/25/2019   Procedure: LAPAROSCOPIC LOOP COLOSTOMY;  Surgeon: Gaynelle Adu, MD;  Location: [facility];  Service: General;  Laterality: N/A;   TONSILLECTOMY     Family History:  Family History  Problem Relation Age of Onset   Colon polyps Mother        does not believe adenomas   Arrhythmia Mother    Colon cancer Neg Hx    Social History:  Social History   Tobacco Use   Smoking status: Never    Passive exposure: Never   Smokeless tobacco: Never  Vaping Use   Vaping status: Never Used  Substance Use Topics   Alcohol use: Not Currently   Drug use: Not Currently    Home Medications:  Prior to Admission medications   Medication Sig Start Date End Date Taking? Authorizing Provider  cholecalciferol (VITAMIN D3) 25 MCG (1000 UNIT) tablet Take 1,000 Units by mouth daily.    [provider]  famotidine (PEPCID) 20 MG tablet Take 20 mg by mouth daily. 11/11/21   [provider]  fluticasone (FLONASE) 50 MCG/ACT nasal spray Place 2 sprays into both nostrils daily. 09/15/22   [provider]  gabapentin (NEURONTIN) 100 MG capsule Take 1 capsule (100 mg total) by mouth at bedtime as needed (pain). 11/13/22   Vassie Loll, MD  hydrocortisone (ANUSOL-HC) 2.5 % rectal cream Place 1 Application rectally 2 (two) times daily. 12/05/22   Gelene Mink, NP  hydrOXYzine (ATARAX) 25 MG tablet Take 25 mg by mouth as needed for itching. 09/28/22   [provider]  Ipratropium-Albuterol (COMBIVENT) 20-100 MCG/ACT AERS respimat Inhale 1 puff into the lungs every 6 (six) hours as needed for wheezing or shortness of breath. 11/13/22   Vassie Loll, MD  loratadine (CLARITIN) 10 MG tablet Take 1 tablet (10 mg total) by mouth daily. 11/14/22   Vassie Loll, MD  montelukast (SINGULAIR) 10 MG tablet Take 10 mg by mouth at bedtime. 09/19/22   [provider]  Nystatin (GERHARDT'S  BUTT CREAM) CREA Apply 1 Application topically 4 (four) times daily as needed for irritation. 11/13/22   Vassie Loll, MD  omeprazole (PRILOSEC) 40 MG capsule Take 1 capsule (40 mg total) by mouth 2 (two) times daily. 01/19/23   Tyrone Nine, MD  vitamin C (ASCORBIC ACID) 500 MG tablet Take 500 mg by mouth daily.    [provider]    Inpatient Medications:  Current Facility-Administered Medications:    0.9 %  sodium chloride infusion, , Intravenous, Continuous, Hongalgi, Anand D, MD, Last Rate: 125 mL/hr at 03/10/23 0916, Rate Change at 03/10/23 0916   0.9 %  sodium chloride infusion, , Intravenous, Continuous, Tulip Meharg, Juanetta Beets, MD   famotidine (PEPCID) tablet 20 mg, 20 mg, Oral, Daily, Levie Heritage, DO, 20 mg at 03/10/23 0840   gabapentin (NEURONTIN) capsule 100 mg, 100 mg, Oral, QHS PRN, Levie Heritage, DO, 100 mg at 03/09/23 2255   HYDROcodone-acetaminophen (NORCO/VICODIN) 5-325 MG per tablet 1 tablet, 1 tablet, Oral, Q4H PRN, Levie Heritage, DO, 1 tablet at 03/09/23 2255   hydrOXYzine (ATARAX) tablet 25 mg, 25 mg, Oral,  PRN, Levie Heritage, DO, 25 mg at 03/09/23 2255   ondansetron (ZOFRAN) tablet 4 mg, 4 mg, Oral, Q6H PRN **OR** ondansetron (ZOFRAN) injection 4 mg, 4 mg, Intravenous, Q6H PRN, Stinson, Jacob J, DO   pantoprazole (PROTONIX) EC tablet 40 mg, 40 mg, Oral, Daily, Levie Heritage, DO, 40 mg at 03/10/23 0840   pneumococcal 20-valent conjugate vaccine (PREVNAR 20) injection 0.5 mL, 0.5 mL, Intramuscular, Tomorrow-1000, Stinson, Jacob J, DO Allergies: Pantoprazole, Penicillins, and Oxycodone hcl  Complete Review of Systems: GENERAL: negative for malaise, night sweats HEENT: No changes in hearing or vision, no nose bleeds or other nasal problems. NECK: Negative for lumps, goiter, pain and significant neck swelling RESPIRATORY: Negative for cough, wheezing CARDIOVASCULAR: Negative for chest pain, leg swelling, palpitations, orthopnea GI: SEE  HPI MUSCULOSKELETAL: Negative for joint pain or swelling, back pain, and muscle pain. SKIN: Negative for lesions, rash PSYCH: Negative for sleep disturbance, mood disorder and recent psychosocial stressors. HEMATOLOGY Negative for prolonged bleeding, bruising easily, and swollen nodes. ENDOCRINE: Negative for cold or heat intolerance, polyuria, polydipsia and goiter. NEURO: negative for tremor, gait imbalance, syncope and seizures. The remainder of the review of systems is noncontributory.  Physical Exam: BP (!) 92/49 (BP Location: Left Arm)   Pulse (!) 101   Temp 98.2 F (36.8 C) (Oral)   Resp 17   Ht 4\' 9"  (1.448 m)   Wt 82.4 kg   LMP 11/30/2021   SpO2 100%   BMI 39.30 kg/m  GENERAL: The patient is AO x3, in no acute distress. HEENT: Head is normocephalic and atraumatic. EOMI are intact. Mouth is well hydrated and without lesions. NECK: Supple. No masses LUNGS: Clear to auscultation. No presence of rhonchi/wheezing/rales. Adequate chest expansion HEART: RRR, normal s1 and s2. ABDOMEN: Soft, nontender, no guarding, no peritoneal signs, and nondistended. BS +. No masses. EXTREMITIES: Without any cyanosis, clubbing, rash, lesions or edema. NEUROLOGIC: AOx3, no focal motor deficit. SKIN: no jaundice, no rashes  Laboratory Data CBC:     Component Value Date/Time   WBC 4.4 03/10/2023 0839   RBC 3.45 (L) 03/10/2023 0839   HGB 9.2 (L) 03/10/2023 0839   HCT 29.6 (L) 03/10/2023 0839   PLT 171 03/10/2023 0839   MCV 85.8 03/10/2023 0839   MCH 26.7 03/10/2023 0839   MCHC 31.1 03/10/2023 0839   RDW 17.3 (H) 03/10/2023 0839   LYMPHSABS 1.5 03/09/2023 1520   MONOABS 0.3 03/09/2023 1520   EOSABS 0.1 03/09/2023 1520   BASOSABS 0.0 03/09/2023 1520   COAG:  Lab Results  Component Value Date   INR 1.1 03/09/2023   INR 1.0 12/05/2022   INR 1.1 11/11/2022    BMP:     Latest Ref Rng & Units 03/10/2023    4:42 AM 03/09/2023    3:20 PM 01/17/2023    4:10 AM  BMP  Glucose 70 - 99  mg/dL 73  91  76   BUN 6 - 20 mg/dL <5  <5  14   Creatinine 0.44 - 1.00 mg/dL 4.09  8.11  9.14   Sodium 135 - 145 mmol/L 133  127  135   Potassium 3.5 - 5.1 mmol/L 3.7  3.9  3.6   Chloride 98 - 111 mmol/L 102  95  106   CO2 22 - 32 mmol/L 20  19  21    Calcium 8.9 - 10.3 mg/dL 8.5  8.6  8.3     HEPATIC:     Latest Ref Rng & Units 03/10/2023  4:42 AM 03/09/2023    3:20 PM 01/17/2023    4:10 AM  Hepatic Function  Total Protein 6.5 - 8.1 g/dL 7.1  8.3  6.2   Albumin 3.5 - 5.0 g/dL 3.3  3.7  2.8   AST 15 - 41 U/L 189  119  45   ALT 0 - 44 U/L 51  53  26   Alk Phosphatase 38 - 126 U/L 206  219  129   Total Bilirubin 0.3 - 1.2 mg/dL 1.5  1.0  1.7     CARDIAC: No results found for: "CKTOTAL", "CKMB", "CKMBINDEX", "TROPONINI"   Imaging: I personally reviewed and interpreted the available imaging.  Baseline hemoglobin 9-10.  Initially presented with 11 trended down to 9.2.  BUN/creatinine remain slow  Liver enzymes remains elevated AST 189 ALT 51 alk phos 206 T. bili 1.5  Assessment & Plan:  This is a 52 y.o. female with history of necrotizing fasciitis s/p diverting colostomy June 2021 complicated by parastomal hernia, small bowel obstruction January/February 2023, previous GI bleed due to peptic ulcer disease, July 2023 she underwent lap assisted colostomy takedown and partial colectomy by Dr. Maisie Fus, compensated cirrhosis presented to the ER with fresh blood per rectum.  GI is consulted for lower GI bleed  # Painless hematochezia #PUD  This is likely lower GI bleed from previously seen single rectal ulcer thought to be due to NSAID was ischemic injury.  Last colonoscopy 2022 with friable rectal mucosa Patient is hemodynamically stable and hemoglobin has trended down slightly Last upper endoscopy done around 8 weeks ago with peptic ulcer disease Today patient reports questionable black tarry stools as well  At this time we will proceed with colonoscopy given continued painless  hematochezia  also due to questionable melena, history of peptic ulcer disease and patient due for surveillance of healing of gastric ulcer we will also proceed with upper endoscopy at the same time   #Compensated cirrhosis #Elevated liver enzymes #CBD Dilation   MELD 3.0: 13 at 03/10/2023  4:42 AM MELD-Na: 9 at 03/10/2023  4:42 AM Calculated from: Serum Creatinine: 0.47 mg/dL (Using min of 1 mg/dL) at 10/23/8117  1:47 AM Serum Sodium: 133 mmol/L at 03/10/2023  4:42 AM Total Bilirubin: 1.5 mg/dL at 03/14/5620  3:08 AM Serum Albumin: 3.3 g/dL at 6/57/8469  6:29 AM INR(ratio): 1.1 at 03/09/2023  9:29 PM Age at listing (hypothetical): 51 years Sex: Female at 03/10/2023  4:42 AM  #Eitology :   Likely etiology is alcohol use.  AST more than ALT.last viral hepatitis profile checked in 2022 hep A nonimmune hep B negative and nonimmune hep C negative.  ANA , AMA negative.  Normal alpha-1 antitrypsin  # Hepatic encephalopathy None - Avoid opiates or benzodiazepines  # Ascites None on exam and imaging  # Esophageal varices No varices on recent upper endoscopy  # HCC screening CT done 03/09/2023 without any focal lesion Will check AFP  Recommendation  -Will check anti-smooth muscle antibody.AFP -Patient need to be enrolled in every 6 month HCC screening with ultrasound and AFP -Outpatient MRI abdomen with MRCP for CBD dilation  Vista Lawman, MD Gastroenterology and Hepatology [facility] Gastroenterology  This chart has been completed using [facility] Dictation software, and while attempts have been made to ensure accuracy , certain words and phrases may not be transcribed as intended

## 2023-03-10 NOTE — Transfer of Care (Signed)
Immediate Anesthesia Transfer of Care Note  Patient: Alyssa Carey  Procedure(s) Performed: ESOPHAGOGASTRODUODENOSCOPY (EGD) WITH PROPOFOL COLONOSCOPY WITH PROPOFOL POLYPECTOMY BIOPSY  Patient Location: PACU  Anesthesia Type:General  Level of Consciousness: awake and alert   Airway & Oxygen Therapy: Patient Spontanous Breathing  Post-op Assessment: Report given to RN and Post -op Vital signs reviewed and stable  Post vital signs: Reviewed and stable  Last Vitals:  Vitals Value Taken Time  BP 126/96   Temp 98   Pulse 96 03/10/23 1046  Resp 18   SpO2 100 % 03/10/23 1046  Vitals shown include unfiled device data.  Last Pain:  Vitals:   03/10/23 0800  TempSrc: Oral  PainSc: 8          Complications: No notable events documented.

## 2023-03-14 ENCOUNTER — Encounter (INDEPENDENT_AMBULATORY_CARE_PROVIDER_SITE_OTHER): Payer: Self-pay | Admitting: *Deleted

## 2023-03-14 ENCOUNTER — Encounter (HOSPITAL_BASED_OUTPATIENT_CLINIC_OR_DEPARTMENT_OTHER): Payer: 59 | Attending: General Surgery | Admitting: General Surgery

## 2023-03-14 DIAGNOSIS — L98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: Secondary | ICD-10-CM | POA: Insufficient documentation

## 2023-03-14 DIAGNOSIS — K7030 Alcoholic cirrhosis of liver without ascites: Secondary | ICD-10-CM | POA: Diagnosis not present

## 2023-03-14 DIAGNOSIS — E46 Unspecified protein-calorie malnutrition: Secondary | ICD-10-CM | POA: Insufficient documentation

## 2023-03-14 DIAGNOSIS — R197 Diarrhea, unspecified: Secondary | ICD-10-CM | POA: Diagnosis not present

## 2023-03-14 DIAGNOSIS — T8131XA Disruption of external operation (surgical) wound, not elsewhere classified, initial encounter: Secondary | ICD-10-CM | POA: Diagnosis not present

## 2023-03-14 LAB — SURGICAL PATHOLOGY

## 2023-03-14 NOTE — Progress Notes (Signed)
Alyssa, Fincannon Fredrica Carey ([id number]) [id number] Page 1 of 11 Visit Report for 03/14/2023 Allergy List Details Patient Name: Date of Service: Alyssa Carey, Alyssa Carey 03/14/2023 9:00 A M Medical Record Number: [id number] Patient Account Number: [id number] Date of Birth/Sex: Treating RN: Apr 13, 1971 (52 y.o. [name] Primary Care Aaden Buckman: Donzetta Sprung Other Clinician: Referring Alonda Weaber: Treating Taiyo Kozma/Extender: Tery Sanfilippo NE, A NNA Weeks in Treatment: 0 Allergies Active Allergies pantoprazole penicillin Reaction: hives oxycodone Reaction: rash Allergy Notes Electronic Signature(s) Signed: 03/14/2023 4:47:14 PM By: Zenaida Deed RN, BSN Entered By: Zenaida Deed on 03/14/2023 09:01:11 -------------------------------------------------------------------------------- Arrival Information Details Patient Name: Date of Service: Alyssa Goon. 03/14/2023 9:00 A M Medical Record Number: [id number] Patient Account Number: [id number] Date of Birth/Sex: Treating RN: 1970-08-29 (52 y.o. [name] Primary Care Rosina Cressler: Donzetta Sprung Other Clinician: Referring Antonietta Lansdowne: Treating Jerritt Cardoza/Extender: Tery Sanfilippo NE, A NNA Weeks in Treatment: 0 Visit Information Patient Arrived: Ambulatory Arrival Time: 08:52 Accompanied By: son Transfer Assistance: None Patient Identification Verified: Yes Secondary Verification Process Completed: Yes Patient Requires Transmission-Based Precautions: No Patient Has Alerts: No Electronic Signature(s) Signed: 03/14/2023 4:47:14 PM By: Zenaida Deed RN, BSN Entered By: Zenaida Deed on 03/14/2023 08:59:46 Alyssa Carey ([id number]) [id number] Page 2 of 11 -------------------------------------------------------------------------------- Clinic Level of Care Assessment Details Patient Name: Date of Service: Alyssa Carey, Alyssa Carey 03/14/2023 9:00 A M Medical Record Number:  [id number] Patient Account Number: [id number] Date of Birth/Sex: Treating RN: July 17, 1971 (52 y.o. [name] Primary Care Hallelujah Wysong: Donzetta Sprung Other Clinician: Referring Rosaire Cueto: Treating Shontel Santee/Extender: Tery Sanfilippo NE, A NNA Weeks in Treatment: 0 Clinic Level of Care Assessment Items TOOL 1 Quantity Score []  - 0 Use when EandM and Procedure is performed on INITIAL visit ASSESSMENTS - Nursing Assessment / Reassessment X- 1 20 General Physical Exam (combine w/ comprehensive assessment (listed just below) when performed on new pt. evals) X- 1 25 Comprehensive Assessment (HX, ROS, Risk Assessments, Wounds Hx, etc.) ASSESSMENTS - Wound and Skin Assessment / Reassessment []  - 0 Dermatologic / Skin Assessment (not related to wound area) ASSESSMENTS - Ostomy and/or Continence Assessment and Care []  - 0 Incontinence Assessment and Management []  - 0 Ostomy Care Assessment and Management (repouching, etc.) PROCESS - Coordination of Care X - Simple Patient / Family Education for ongoing care 1 15 []  - 0 Complex (extensive) Patient / Family Education for ongoing care X- 1 10 Staff obtains Chiropractor, Records, T Results / Process Orders est []  - 0 Staff telephones HHA, Nursing Homes / Clarify orders / etc []  - 0 Routine Transfer to another Facility (non-emergent condition) []  - 0 Routine Hospital Admission (non-emergent condition) X- 1 15 New Admissions / Manufacturing engineer / Ordering NPWT Apligraf, etc. , []  - 0 Emergency Hospital Admission (emergent condition) PROCESS - Special Needs []  - 0 Pediatric / Minor Patient Management []  - 0 Isolation Patient Management []  - 0 Hearing / Language / Visual special needs []  - 0 Assessment of Community assistance (transportation, D/C planning, etc.) []  - 0 Additional assistance / Altered mentation []  - 0 Support Surface(s) Assessment (bed, cushion, seat, etc.) INTERVENTIONS - Miscellaneous []  - 0 External  ear exam []  - 0 Patient Transfer (multiple staff / Nurse, adult / Similar devices) []  - 0 Simple Staple / Suture removal (25 or less) []  - 0 Complex Staple / Suture removal (26 or more) []  - 0 Hypo/Hyperglycemic Management (do not check if billed separately) []  - 0 Ankle / Brachial Index (ABI) -  do not check if billed separately Has the patient been seen at the hospital within the last three years: Yes Total Score: 85 Level Of Care: New/Established - Level 3 Electronic Signature(s) Signed: 03/14/2023 4:47:14 PM By: Zenaida Deed RN, BSN Alyssa Carey, Signed: 03/14/2023 4:47:14 PM By: Zenaida Deed RN, BSN Alyssa Carey ([id number]) [id number] Page 3 of 11 Entered By: Zenaida Deed on 03/14/2023 10:14:03 -------------------------------------------------------------------------------- Encounter Discharge Information Details Patient Name: Date of Service: Alyssa Carey, Alyssa Carey 03/14/2023 9:00 A M Medical Record Number: [id number] Patient Account Number: [id number] Date of Birth/Sex: Treating RN: 1970/11/17 (52 y.o. [name] Primary Care Kalyse Meharg: Donzetta Sprung Other Clinician: Referring Jazzmine Kleiman: Treating Maicey Barrientez/Extender: Tery Sanfilippo NE, A NNA Weeks in Treatment: 0 Encounter Discharge Information Items Post Procedure Vitals Discharge Condition: Stable Temperature (F): 98.1 Ambulatory Status: Ambulatory Pulse (bpm): 85 Discharge Destination: Home Respiratory Rate (breaths/min): 18 Transportation: Private Auto Blood Pressure (mmHg): 126/86 Accompanied By: son Schedule Follow-up Appointment: Yes Clinical Summary of Care: Patient Declined Electronic Signature(s) Signed: 03/14/2023 4:47:14 PM By: Zenaida Deed RN, BSN Entered By: Zenaida Deed on 03/14/2023 10:15:46 -------------------------------------------------------------------------------- Lower Extremity Assessment Details Patient Name: Date of Service: Alyssa Goon. 03/14/2023  9:00 A M Medical Record Number: [id number] Patient Account Number: [id number] Date of Birth/Sex: Treating RN: 1970/09/07 (52 y.o. [name] Primary Care Alyssa Vandoren: Donzetta Sprung Other Clinician: Referring Kimberlye Dilger: Treating Lakynn Halvorsen/Extender: Tery Sanfilippo NE, A NNA Weeks in Treatment: 0 Electronic Signature(s) Signed: 03/14/2023 4:47:14 PM By: Zenaida Deed RN, BSN Entered By: Zenaida Deed on 03/14/2023 09:08:43 -------------------------------------------------------------------------------- Multi Wound Chart Details Patient Name: Date of Service: Alyssa Goon 03/14/2023 9:00 A M Medical Record Number: [id number] Patient Account Number: [id number] Date of Birth/Sex: Treating RN: [birth year] (52 y.o. F) Primary Care Marieelena Bartko: Donzetta Sprung Other Clinician: Referring Briannon Boggio: Treating Susana Duell/Extender: Tery Sanfilippo NE, A NNA Weeks in Treatment: 0 Vital Signs Height(in): Pulse(bpm): 85 Weight(lbs): Blood Pressure(mmHg): 126/86 NATUSHA, ALLISTON Carey ([id number]) [id number] Page 4 of 11 Body Mass Index(BMI): Temperature(F): 98.1 Respiratory Rate(breaths/min): 18 [1:Photos:] [N/A:N/A] Gluteal fold Proximal Gluteal fold N/A Wound Location: Gradually Appeared Gradually Appeared N/A Wounding Event: Abrasion Abrasion N/A Primary Etiology: Arrhythmia, Hypertension, Cirrhosis , Arrhythmia, Hypertension, Cirrhosis , N/A Comorbid History: Anorexia/bulimia Anorexia/bulimia 02/13/2023 02/13/2023 N/A Date Acquired: 0 0 N/A Weeks of Treatment: Open Open N/A Wound Status: No No N/A Wound Recurrence: 1.3x0.5x0.3 0.9x0.4x0.1 N/A Measurements Carey x W x D (cm) 0.511 0.283 N/A A (cm) : rea 0.153 0.028 N/A Volume (cm) : 12 Starting Position 1 (o'clock): 6 Ending Position 1 (o'clock): 0.5 Maximum Distance 1 (cm): Yes No N/A Undermining: Full Thickness Without Exposed Full Thickness Without Exposed N/A Classification: Support  Structures Support Structures Medium Small N/A Exudate A mount: Serosanguineous Serosanguineous N/A Exudate Type: red, brown red, brown N/A Exudate Color: Epibole Epibole N/A Wound Margin: Small (1-33%) Large (67-100%) N/A Granulation A mount: Pink Red N/A Granulation Quality: Large (67-100%) Small (1-33%) N/A Necrotic A mount: Fat Layer (Subcutaneous Tissue): Yes Fat Layer (Subcutaneous Tissue): Yes N/A Exposed Structures: Fascia: No Fascia: No Tendon: No Tendon: No Muscle: No Muscle: No Joint: No Joint: No Bone: No Bone: No None None N/A Epithelialization: Debridement - Selective/Open Wound Debridement - Selective/Open Wound N/A Debridement: Pre-procedure Verification/Time Out 09:45 09:45 N/A Taken: Lidocaine 4% Topical Solution Lidocaine 4% Topical Solution N/A Pain Control: [employer] N/A Tissue Debrided: Non-Viable Tissue Non-Viable Tissue N/A Level: 0.51 0.28 N/A Debridement A (sq cm): rea Curette Curette N/A Instrument: Minimum Minimum N/A Bleeding: Pressure  Pressure N/A Hemostasis A chieved: 5 5 N/A Procedural Pain: 5 5 N/A Post Procedural Pain: Procedure was tolerated well Procedure was tolerated well N/A Debridement Treatment Response: 1.3x0.5x0.3 0.9x0.4x0.1 N/A Post Debridement Measurements Carey x W x D (cm) 0.153 0.028 N/A Post Debridement Volume: (cm) Scarring: Yes Scarring: Yes N/A Periwound Skin Texture: No Abnormalities Noted No Abnormalities Noted N/A Periwound Skin Moisture: No Abnormalities Noted No Abnormalities Noted N/A Periwound Skin Color: No Abnormality No Abnormality N/A Temperature: Yes Yes N/A Tenderness on Palpation: Debridement Debridement N/A Procedures Performed: Treatment Notes Wound #1 (Gluteal fold) Cleanser Peri-Wound Care Topical Primary Dressing Maxorb Extra Ag+ Alginate Dressing, 2x2 (in/in) Delamar, Branae Carey ([id number]) [id number] Page 5 of 11 Discharge Instruction: Apply  to wound bed as instructed Secondary Dressing Woven Gauze Sponge, Non-Sterile 4x4 in Discharge Instruction: Apply over primary dressing , roll and tape in place Secured With 29M Medipore H Soft Cloth Surgical T ape, 4 x 10 (in/yd) Discharge Instruction: Secure with tape as directed. Compression Wrap Compression Stockings Add-Ons Wound #2 (Gluteal fold) Wound Laterality: Proximal Cleanser Peri-Wound Care Topical Primary Dressing Maxorb Extra Ag+ Alginate Dressing, 2x2 (in/in) Discharge Instruction: Apply to wound bed as instructed Secondary Dressing Woven Gauze Sponge, Non-Sterile 4x4 in Discharge Instruction: Apply over primary dressing , roll and tape in place Secured With 29M Medipore H Soft Cloth Surgical T ape, 4 x 10 (in/yd) Discharge Instruction: Secure with tape as directed. Compression Wrap Compression Stockings Add-Ons Electronic Signature(s) Signed: 03/14/2023 10:36:40 AM By: Duanne Guess MD FACS Entered By: Duanne Guess on 03/14/2023 10:36:39 -------------------------------------------------------------------------------- Multi-Disciplinary Care Plan Details Patient Name: Date of Service: Alyssa Goon. 03/14/2023 9:00 A M Medical Record Number: [id number] Patient Account Number: [id number] Date of Birth/Sex: Treating RN: 1970/10/17 (52 y.o. [name] Primary Care Jama Krichbaum: Donzetta Sprung Other Clinician: Referring Jene Oravec: Treating Camilo Mander/Extender: Tery Sanfilippo NE, A NNA Weeks in Treatment: 0 Multidisciplinary Care Plan reviewed with physician Active Inactive Nutrition Nursing Diagnoses: Impaired glucose control: actual or potential Goals: Patient/caregiver agrees to and verbalizes understanding of need to use nutritional supplements and/or vitamins as prescribed CHISMAN, Shasha Carey ([id number]) [id number] Page 6 of 11 Date Initiated: 03/14/2023 Target Resolution Date: 04/11/2023 Goal Status:  Active Interventions: Assess patient nutrition upon admission and as needed per policy Treatment Activities: Patient referred to Primary Care Physician for further nutritional evaluation : 03/14/2023 Notes: Wound/Skin Impairment Nursing Diagnoses: Impaired tissue integrity Knowledge deficit related to ulceration/compromised skin integrity Goals: Patient/caregiver will verbalize understanding of skin care regimen Date Initiated: 03/14/2023 Target Resolution Date: 04/11/2023 Goal Status: Active Ulcer/skin breakdown will have a volume reduction of 30% by week 4 Date Initiated: 03/14/2023 Target Resolution Date: 04/11/2023 Goal Status: Active Interventions: Assess patient/caregiver ability to obtain necessary supplies Assess patient/caregiver ability to perform ulcer/skin care regimen upon admission and as needed Assess ulceration(s) every visit Provide education on ulcer and skin care Treatment Activities: Skin care regimen initiated : 03/14/2023 Topical wound management initiated : 03/14/2023 Notes: Electronic Signature(s) Signed: 03/14/2023 4:47:14 PM By: Zenaida Deed RN, BSN Entered By: Zenaida Deed on 03/14/2023 09:51:37 -------------------------------------------------------------------------------- Pain Assessment Details Patient Name: Date of Service: Alyssa Goon. 03/14/2023 9:00 A M Medical Record Number: [id number] Patient Account Number: [id number] Date of Birth/Sex: Treating RN: 1971/07/06 (52 y.o. [name] Primary Care Ieshia Hatcher: Donzetta Sprung Other Clinician: Referring Kym Fenter: Treating Burnadette Baskett/Extender: Tery Sanfilippo NE, A NNA Weeks in Treatment: 0 Active Problems Location of Pain Severity and Description of Pain Patient Has Paino Yes Site Locations  Pain Location: Alyssa Carey, Alyssa Carey ([id number]) [id number] Page 7 of 11 Pain Location: Pain in Ulcers With Dressing Change: Yes Duration of the Pain. Constant /  Intermittento Constant Rate the pain. Current Pain Level: 7 Worst Pain Level: 9 Least Pain Level: 4 Character of Pain Describe the Pain: Aching, Burning, Throbbing Pain Management and Medication Current Pain Management: Medication: Yes Other: reposition Activity: Yes Is the Current Pain Management Adequate: Adequate How does your wound impact your activities of daily livingo Sleep: Yes Bathing: No Appetite: No Relationship With Others: No Bladder Continence: No Emotions: No Bowel Continence: No Work: No Toileting: No Drive: No Dressing: No Hobbies: No Psychologist, prison and probation services) Signed: 03/14/2023 4:47:14 PM By: Zenaida Deed RN, BSN Entered By: Zenaida Deed on 03/14/2023 09:31:29 -------------------------------------------------------------------------------- Patient/Caregiver Education Details Patient Name: Date of Service: Alyssa Carey, Alyssa NICA Carey. 8/28/2024andnbsp9:00 A M Medical Record Number: [id number] Patient Account Number: [id number] Date of Birth/Gender: Treating RN: [birth year] (52 y.o. [name] Primary Care Physician: Donzetta Sprung Other Clinician: Referring Physician: Treating Physician/Extender: Tery Sanfilippo NE, A NNA Weeks in Treatment: 0 Education Assessment Education Provided To: Patient Education Topics Provided Nutrition: Methods: Explain/Verbal Responses: Reinforcements needed, State content correctly Wound/Skin Impairment: Methods: Explain/Verbal Responses: Reinforcements needed, State content correctly Electronic Signature(s) Signed: 03/14/2023 4:47:14 PM By: Zenaida Deed RN, BSN [place], Signed: 03/14/2023 4:47:14 PM By: Zenaida Deed RN, BSN Mareta Carey ([id number]) [id number] Page 8 of 11 Entered By: Zenaida Deed on 03/14/2023 10:13:22 -------------------------------------------------------------------------------- Wound Assessment Details Patient Name: Date of Service: Alyssa Carey, Alyssa Carey  03/14/2023 9:00 A M Medical Record Number: [id number] Patient Account Number: [id number] Date of Birth/Sex: Treating RN: [birth year] (52 y.o. Billy Coast, Bonita Quin Primary Care Treydon Henricks: Donzetta Sprung Other Clinician: Referring Ernest Orr: Treating Vinton Layson/Extender: Tery Sanfilippo NE, A NNA Weeks in Treatment: 0 Wound Status Wound Number: 1 Primary Etiology: Abrasion Wound Location: Gluteal fold Wound Status: Open Wounding Event: Gradually Appeared Comorbid History: Arrhythmia, Hypertension, Cirrhosis , Anorexia/bulimia Date Acquired: 02/13/2023 Weeks Of Treatment: 0 Clustered Wound: No Photos Wound Measurements Length: (cm) 1.3 Width: (cm) 0.5 Depth: (cm) 0.3 Area: (cm) 0.511 Volume: (cm) 0.153 % Reduction in Area: % Reduction in Volume: Epithelialization: None Tunneling: No Undermining: Yes Starting Position (o'clock): 12 Ending Position (o'clock): 6 Maximum Distance: (cm) 0.5 Wound Description Classification: Full Thickness Without Exposed Suppo Wound Margin: Epibole Exudate Amount: Medium Exudate Type: Serosanguineous Exudate Color: red, brown rt Structures Foul Odor After Cleansing: No Slough/Fibrino Yes Wound Bed Granulation Amount: Small (1-33%) Exposed Structure Granulation Quality: Pink Fascia Exposed: No Necrotic Amount: Large (67-100%) Fat Layer (Subcutaneous Tissue) Exposed: Yes Necrotic Quality: Adherent Slough Tendon Exposed: No Muscle Exposed: No Joint Exposed: No Bone Exposed: No Periwound Skin Texture Texture Color No Abnormalities Noted: No No Abnormalities Noted: Yes Scarring: Yes Temperature / Pain Temperature: No Abnormality Moisture No Abnormalities Noted: Yes Tenderness on PalpationJhanae Rathbun, Freeda Carey ([id number]) [id number] Page 9 of 11 Treatment Notes Wound #1 (Gluteal fold) Cleanser Peri-Wound Care Topical Primary Dressing Maxorb Extra Ag+ Alginate Dressing, 2x2 (in/in) Discharge Instruction:  Apply to wound bed as instructed Secondary Dressing Woven Gauze Sponge, Non-Sterile 4x4 in Discharge Instruction: Apply over primary dressing , roll and tape in place Secured With 67M Medipore H Soft Cloth Surgical T ape, 4 x 10 (in/yd) Discharge Instruction: Secure with tape as directed. Compression Wrap Compression Stockings Add-Ons Electronic Signature(s) Signed: 03/14/2023 10:35:48 AM By: Duanne Guess MD FACS Signed: 03/14/2023 4:47:14 PM By: Zenaida Deed RN, BSN Entered By: Duanne Guess on 03/14/2023 10:35:47 --------------------------------------------------------------------------------  Wound Assessment Details Patient Name: Date of Service: Alyssa Carey, Alyssa Carey 03/14/2023 9:00 A M Medical Record Number: [id number] Patient Account Number: [id number] Date of Birth/Sex: Treating RN: Nov 29, 1970 (52 y.o. [name] Primary Care Kelcey Wickstrom: Donzetta Sprung Other Clinician: Referring Berneta Sconyers: Treating Lala Been/Extender: Tery Sanfilippo NE, A NNA Weeks in Treatment: 0 Wound Status Wound Number: 2 Primary Etiology: Abrasion Wound Location: Proximal Gluteal fold Wound Status: Open Wounding Event: Gradually Appeared Comorbid History: Arrhythmia, Hypertension, Cirrhosis , Anorexia/bulimia Date Acquired: 02/13/2023 Weeks Of Treatment: 0 Clustered Wound: No Photos Wound Measurements Length: (cm) 0.9 Width: (cm) 0.4 Ramnauth, Phoebe Carey ([id number]) Depth: (cm) 0.1 Area: (cm) 0.283 Volume: (cm) 0.028 % Reduction in Area: % Reduction in Volume: [id number] Page 10 of 11 Epithelialization: None Tunneling: No Undermining: No Wound Description Classification: Full Thickness Without Exposed Support Structures Wound Margin: Epibole Exudate Amount: Small Exudate Type: Serosanguineous Exudate Color: red, brown Foul Odor After Cleansing: No Slough/Fibrino Yes Wound Bed Granulation Amount: Large (67-100%) Exposed Structure Granulation  Quality: Red Fascia Exposed: No Necrotic Amount: Small (1-33%) Fat Layer (Subcutaneous Tissue) Exposed: Yes Necrotic Quality: Adherent Slough Tendon Exposed: No Muscle Exposed: No Joint Exposed: No Bone Exposed: No Periwound Skin Texture Texture Color No Abnormalities Noted: No No Abnormalities Noted: Yes Scarring: Yes Temperature / Pain Temperature: No Abnormality Moisture No Abnormalities Noted: Yes Tenderness on Palpation: Yes Treatment Notes Wound #2 (Gluteal fold) Wound Laterality: Proximal Cleanser Peri-Wound Care Topical Primary Dressing Maxorb Extra Ag+ Alginate Dressing, 2x2 (in/in) Discharge Instruction: Apply to wound bed as instructed Secondary Dressing Woven Gauze Sponge, Non-Sterile 4x4 in Discharge Instruction: Apply over primary dressing , roll and tape in place Secured With 70M Medipore H Soft Cloth Surgical T ape, 4 x 10 (in/yd) Discharge Instruction: Secure with tape as directed. Compression Wrap Compression Stockings Add-Ons Electronic Signature(s) Signed: 03/14/2023 10:36:16 AM By: Duanne Guess MD FACS Signed: 03/14/2023 4:47:14 PM By: Zenaida Deed RN, BSN Entered By: Duanne Guess on 03/14/2023 10:36:16 -------------------------------------------------------------------------------- Vitals Details Patient Name: Date of Service: Alyssa Goon. 03/14/2023 9:00 A M Medical Record Number: [id number] Patient Account Number: [id number] Date of Birth/Sex: Treating RN: [birth year] (52 y.o. [name] Primary Care Bernise Sylvain: Donzetta Sprung Other Clinician: Referring Felix Meras: Treating Sheronda Parran/Extender: Tery Sanfilippo NE, A NNA Weeks in Treatment: 0 Alyssa Carey, Alyssa Carey ([id number]) [id number] Page 11 of 11 Vital Signs Time Taken: 09:00 Temperature (F): 98.1 Pulse (bpm): 85 Respiratory Rate (breaths/min): 18 Blood Pressure (mmHg): 126/86 Reference Range: 80 - 120 mg / dl Electronic Signature(s) Signed:  03/14/2023 4:47:14 PM By: Zenaida Deed RN, BSN Entered By: Zenaida Deed on 03/14/2023 09:00:43

## 2023-03-14 NOTE — Progress Notes (Signed)
Niyah, Flammer Azaya Carey ([id number]) [id number] Page 1 of 4 Visit Report for 03/14/2023 Abuse Risk Screen Details Patient Name: Date of Service: Alyssa Carey, Alyssa Carey 03/14/2023 9:00 A M Medical Record Number: [id number] Patient Account Number: [id number] Date of Birth/Sex: Treating RN: [birth year] (52 y.o. [name] Primary Care Dohn Stclair: Donzetta Sprung Other Clinician: Referring Kahlia Lagunes: Treating Esther Bradstreet/Extender: Tery Sanfilippo NE, A NNA Weeks in Treatment: 0 Abuse Risk Screen Items Answer ABUSE RISK SCREEN: Has anyone close to you tried to hurt or harm you recentlyo No Do you feel uncomfortable with anyone in your familyo No Has anyone forced you do things that you didnt want to doo No Electronic Signature(s) Signed: 03/14/2023 4:47:14 PM By: Zenaida Deed RN, BSN Entered By: Zenaida Deed on 03/14/2023 09:05:37 -------------------------------------------------------------------------------- Activities of Daily Living Details Patient Name: Date of Service: Alyssa Carey, Alyssa Carey 03/14/2023 9:00 A M Medical Record Number: [id number] Patient Account Number: [id number] Date of Birth/Sex: Treating RN: 1970-12-04 (52 y.o. [name] Primary Care Freddi Forster: Donzetta Sprung Other Clinician: Referring Marquavion Venhuizen: Treating Quinlin Conant/Extender: Tery Sanfilippo NE, A NNA Weeks in Treatment: 0 Activities of Daily Living Items Answer Activities of Daily Living (Please select one for each item) Drive Automobile Completely Able T Medications ake Completely Able Use T elephone Completely Able Care for Appearance Completely Able Use T oilet Completely Able Bath / Shower Completely Able Dress Self Completely Able Feed Self Completely Able Walk Completely Able Get In / Out Bed Completely Able Housework Completely Able Prepare Meals Completely Able Handle Money Completely Able Shop for Self Completely Able Electronic Signature(s) Signed:  03/14/2023 4:47:14 PM By: Zenaida Deed RN, BSN Entered By: Zenaida Deed on 03/14/2023 09:05:58 Alyssa Carey, Alyssa Carey ([id number]) [id number] Page 2 of 4 -------------------------------------------------------------------------------- Education Screening Details Patient Name: Date of Service: Alyssa Carey, Alyssa Carey 03/14/2023 9:00 A M Medical Record Number: [id number] Patient Account Number: [id number] Date of Birth/Sex: Treating RN: 03-Apr-1971 (52 y.o. [name] Primary Care Raylen Tangonan: Donzetta Sprung Other Clinician: Referring Nihal Marzella: Treating Hiilei Gerst/Extender: Tery Sanfilippo NE, A NNA Weeks in Treatment: 0 Primary Learner Assessed: Patient Learning Preferences/Education Level/Primary Language Learning Preference: Explanation, Demonstration, Printed Material Highest Education Level: High School Preferred Language: English Cognitive Barrier Language Barrier: No Translator Needed: No Memory Deficit: No Emotional Barrier: No Cultural/Religious Beliefs Affecting Medical Care: No Physical Barrier Impaired Vision: No Impaired Hearing: No Decreased Hand dexterity: No Knowledge/Comprehension Knowledge Level: High Comprehension Level: High Ability to understand written instructions: High Ability to understand verbal instructions: High Motivation Anxiety Level: Calm Cooperation: Cooperative Education Importance: Acknowledges Need Interest in Health Problems: Asks Questions Perception: Coherent Willingness to Engage in Self-Management High Activities: Readiness to Engage in Self-Management High Activities: Electronic Signature(s) Signed: 03/14/2023 4:47:14 PM By: Zenaida Deed RN, BSN Entered By: Zenaida Deed on 03/14/2023 09:06:56 -------------------------------------------------------------------------------- Fall Risk Assessment Details Patient Name: Date of Service: Alyssa Goon. 03/14/2023 9:00 A M Medical Record  Number: [id number] Patient Account Number: [id number] Date of Birth/Sex: Treating RN: 07/09/1971 (52 y.o. [name] Primary Care Jermery Caratachea: Donzetta Sprung Other Clinician: Referring Karynn Deblasi: Treating Oneida Mckamey/Extender: Tery Sanfilippo NE, A NNA Weeks in Treatment: 0 Fall Risk Assessment Items Have you had 2 or more falls in the last 12 monthso 0 No Alyssa Carey, Alyssa Carey ([id number]) [phone] [id number] Page 3 of 4 Have you had any fall that resulted in injury in the last 12 monthso 0 No FALLS RISK SCREEN History of falling - immediate or within 3 months 0  No Secondary diagnosis (Do you have 2 or more medical diagnoseso) 0 No Ambulatory aid None/bed rest/wheelchair/nurse 0 Yes Crutches/cane/walker 0 No Furniture 0 No Intravenous therapy Access/Saline/Heparin Lock 0 No Gait/Transferring Normal/ bed rest/ wheelchair 0 Yes Weak (short steps with or without shuffle, stooped but able to lift head while walking, may seek 0 No support from furniture) Impaired (short steps with shuffle, may have difficulty arising from chair, head down, impaired 0 No balance) Mental Status Oriented to own ability 0 Yes Electronic Signature(s) Signed: 03/14/2023 4:47:14 PM By: Zenaida Deed RN, BSN Entered By: Zenaida Deed on 03/14/2023 09:07:22 -------------------------------------------------------------------------------- Foot Assessment Details Patient Name: Date of Service: Alyssa Goon. 03/14/2023 9:00 A M Medical Record Number: [id number] Patient Account Number: [id number] Date of Birth/Sex: Treating RN: 04-24-1971 (52 y.o. [name] Primary Care Malik Paar: Donzetta Sprung Other Clinician: Referring Lief Palmatier: Treating Lamoyne Hessel/Extender: Tery Sanfilippo NE, A NNA Weeks in Treatment: 0 Foot Assessment Items Site Locations + = Sensation present, - = Sensation absent, C = Callus, U = Ulcer R = Redness, W = Warmth, M = Maceration, PU =  Pre-ulcerative lesion F = Fissure, S = Swelling, D = Dryness Assessment Right: Left: Other Deformity: No No Prior Foot Ulcer: No No Prior Amputation: No No Charcot Joint: No No Ambulatory Status: Ambulatory Without Help GaitCapitola Mccartin, Kylinn Carey ([id number]) [id number] Page 4 of 4 Electronic Signature(s) Signed: 03/14/2023 4:47:14 PM By: Zenaida Deed RN, BSN Entered By: Zenaida Deed on 03/14/2023 09:08:36 -------------------------------------------------------------------------------- Nutrition Risk Screening Details Patient Name: Date of Service: Alyssa Carey, Alyssa Carey 03/14/2023 9:00 A M Medical Record Number: [id number] Patient Account Number: [id number] Date of Birth/Sex: Treating RN: 04/28/1971 (52 y.o. [name] Primary Care Bronte Sabado: Donzetta Sprung Other Clinician: Referring Avalie Oconnor: Treating Megahn Killings/Extender: Tery Sanfilippo NE, A NNA Weeks in Treatment: 0 Height (in): Weight (lbs): Body Mass Index (BMI): Nutrition Risk Screening Items Score Screening NUTRITION RISK SCREEN: I have an illness or condition that made me change the kind and/or amount of food I eat 2 Yes I eat fewer than two meals per day 3 Yes I eat few fruits and vegetables, or milk products 0 No I have three or more drinks of beer, liquor or wine almost every day 0 No I have tooth or mouth problems that make it hard for me to eat 0 No I don't always have enough money to buy the food I need 0 No I eat alone most of the time 1 Yes I take three or more different prescribed or over-the-counter drugs a day 1 Yes Without wanting to, I have lost or gained 10 pounds in the last six months 0 No I am not always physically able to shop, cook and/or feed myself 0 No Nutrition Protocols Good Risk Protocol Moderate Risk Protocol High Risk Proctocol 0 Provide education on nutrition Risk Level: High Risk Score: 7 Electronic Signature(s) Signed: 03/14/2023  4:47:14 PM By: Zenaida Deed RN, BSN Entered By: Zenaida Deed on 03/14/2023 09:08:13

## 2023-03-14 NOTE — Progress Notes (Signed)
I reviewed the pathology results. Ann, can you send her a letter with the findings as described below please? Repeat colonoscopy in 5 years  Thanks,  Vista Lawman, MD Gastroenterology and Hepatology [facility]  ---------------------------------------------------------------------------------------------  [facility] [address], [address], [place], [place] [address] Phone:  [phone]   03/14/23 [place], [place]   Dear Alyssa Carey,  I am writing to inform you that the biopsies taken during your recent endoscopic examination showed:  Stomach ulcer with No H. 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)   Although there is mild inflammation in the colon I do not see evidence of inflammatory bowel disease ( no chronicity seen etc)  Recommendation :  - Repeat upper endoscopy in 12 weeks for surveillance based to assess healing of gastric ulcer -Omeprazole daily - Avoid NSAID: high dose aspirin including Goody/BC powders, NSAIDs such as Aleve, ibuprofen, naproxen, Motrin, Voltaren or Advil (even the topical ones) -Follow up with [place] in GI clinic on 04/03/2023   Please call us at [phone] if you have persistent problems or have questions about your condition that have not been fully answered at this time.  Sincerely,  Vista Lawman, MD Gastroenterology and Hepatology

## 2023-03-14 NOTE — Progress Notes (Signed)
Alyssa, Melson Marielis Carey ([id number]) [id number] Page 1 of 10 Visit Report for 03/14/2023 Chief Complaint Document Details Patient Name: Date of Service: Alyssa Carey, Alyssa Carey 03/14/2023 9:00 A M Medical Record Number: [id number] Patient Account Number: [id number] Date of Birth/Sex: Treating RN: 1971/06/29 (52 y.o. F) Primary Care Provider: Donzetta Sprung Other Clinician: Referring Provider: Treating Provider/Extender: Alyssa Carey, A NNA Weeks in Treatment: 0 Information Obtained from: Patient Chief Complaint Patient seen for complaints of Non-Healing Wounds Electronic Signature(s) Signed: 03/14/2023 10:36:59 AM By: Duanne Guess MD FACS Entered By: Duanne Guess on 03/14/2023 10:36:59 -------------------------------------------------------------------------------- Debridement Details Patient Name: Date of Service: Alyssa Carey. 03/14/2023 9:00 A M Medical Record Number: [id number] Patient Account Number: [id number] Date of Birth/Sex: Treating RN: 04/03/1971 (52 y.o. Alyssa Carey Primary Care Provider: Donzetta Sprung Other Clinician: Referring Provider: Treating Provider/Extender: Alyssa Carey, A NNA Weeks in Treatment: 0 Debridement Performed for Assessment: Wound #1 Gluteal fold Performed By: Physician Duanne Guess, MD Debridement Type: Debridement Level of Consciousness (Pre-procedure): Awake and Alert Pre-procedure Verification/Time Out Yes - 09:45 Taken: Start Time: 09:45 Pain Control: Lidocaine 4% T opical Solution Percent of Wound Bed Debrided: 100% T Area Debrided (cm): otal 0.51 Tissue and other material debrided: Non-Viable, Slough, Slough Level: Non-Viable Tissue Debridement Description: Selective/Open Wound Instrument: Curette Bleeding: Minimum Hemostasis Achieved: Pressure Procedural Pain: 5 Post Procedural Pain: 5 Response to Treatment: Procedure was tolerated well Level of Consciousness (Post- Awake  and Alert procedure): Post Debridement Measurements of Total Wound Length: (cm) 1.3 Width: (cm) 0.5 Depth: (cm) 0.3 Volume: (cm) 0.153 Character of Wound/Ulcer Post Debridement: Improved Post Procedure Diagnosis Alyssa Carey, Alyssa Carey ([id number]) [id number] Page 2 of 10 Same as Pre-procedure Notes scribed for Dr. Lady Gary by Zenaida Deed, RN Electronic Signature(s) Signed: 03/14/2023 3:38:16 PM By: Duanne Guess MD FACS Signed: 03/14/2023 4:47:14 PM By: Zenaida Deed RN, BSN Entered By: Zenaida Deed on 03/14/2023 09:54:03 -------------------------------------------------------------------------------- Debridement Details Patient Name: Date of Service: Alyssa Carey. 03/14/2023 9:00 A M Medical Record Number: [id number] Patient Account Number: [id number] Date of Birth/Sex: Treating RN: May 06, 1971 (52 y.o. Alyssa Carey Primary Care Provider: Donzetta Sprung Other Clinician: Referring Provider: Treating Provider/Extender: Alyssa Carey, A NNA Weeks in Treatment: 0 Debridement Performed for Assessment: Wound #2 Proximal Gluteal fold Performed By: Physician Duanne Guess, MD Debridement Type: Debridement Level of Consciousness (Pre-procedure): Awake and Alert Pre-procedure Verification/Time Out Yes - 09:45 Taken: Start Time: 09:45 Pain Control: Lidocaine 4% T opical Solution Percent of Wound Bed Debrided: 100% T Area Debrided (cm): otal 0.28 Tissue and other material debrided: Non-Viable, Slough, Slough Level: Non-Viable Tissue Debridement Description: Selective/Open Wound Instrument: Curette Bleeding: Minimum Hemostasis Achieved: Pressure Procedural Pain: 5 Post Procedural Pain: 5 Response to Treatment: Procedure was tolerated well Level of Consciousness (Post- Awake and Alert procedure): Post Debridement Measurements of Total Wound Length: (cm) 0.9 Width: (cm) 0.4 Depth: (cm) 0.1 Volume: (cm) 0.028 Character of  Wound/Ulcer Post Debridement: Improved Post Procedure Diagnosis Same as Pre-procedure Notes scribed for Dr. Lady Gary by Zenaida Deed, RN Electronic Signature(s) Signed: 03/14/2023 3:38:16 PM By: Duanne Guess MD FACS Signed: 03/14/2023 4:47:14 PM By: Zenaida Deed RN, BSN Entered By: Zenaida Deed on 03/14/2023 09:54:33 Alyssa Carey, Alyssa Carey ([id number]) [id number] Page 3 of 10 -------------------------------------------------------------------------------- HPI Details Patient Name: Date of Service: Alyssa Carey, Alyssa Carey 03/14/2023 9:00 A M Medical Record Number: [id number] Patient Account Number: [id number] Date of Birth/Sex: Treating RN: June 07, 1971 (52 y.o. F) Primary Care Provider: Donzetta Sprung Other  Clinician: Referring Provider: Treating Provider/Extender: Alyssa Carey, A NNA Weeks in Treatment: 0 History of Present Illness HPI Description: ADMISSION 03/14/2023 This is a 52 year old woman with a past medical history significant for necrotizing fasciitis for which she underwent extensive debridement in 2021. She ended up with a diverting loop colostomy which was subsequently reversed. She apparently has had issues with diarrhea ever since colostomy reversal and has breakdown on her buttocks as result of the diarrhea. She is followed by gastroenterology for the diarrhea. She also has alcoholic cirrhosis with a number of related comorbidities. She has been hospitalized several times in the past couple of months due to GI bleeding, but none of this seems to be variceal, based on the electronic medical record. Electronic Signature(s) Signed: 03/14/2023 10:38:44 AM By: Duanne Guess MD FACS Previous Signature: 03/14/2023 8:46:59 AM Version By: Duanne Guess MD FACS Entered By: Duanne Guess on 03/14/2023 10:38:44 -------------------------------------------------------------------------------- Physical Exam Details Patient Name: Date of  Service: Alyssa Carey 03/14/2023 9:00 A M Medical Record Number: [id number] Patient Account Number: [id number] Date of Birth/Sex: Treating RN: [birth year] (52 y.o. F) Primary Care Provider: Donzetta Sprung Other Clinician: Referring Provider: Treating Provider/Extender: Alyssa Carey, A NNA Weeks in Treatment: 0 Constitutional . . . . No acute distress. Respiratory Normal work of breathing on room air. Notes 03/14/2023: She has areas of skin breakdown along her natal cleft adjacent to her anus. There is breakdown in the skin overlying her coccyx as well as a small ulcer just caudal to this. This second ulcer has some undermining toward the anal area. All of this is found within the scar from her previous surgical debridement of necrotizing fasciitis. Electronic Signature(s) Signed: 03/14/2023 10:40:07 AM By: Duanne Guess MD FACS Entered By: Duanne Guess on 03/14/2023 10:40:06 -------------------------------------------------------------------------------- Physician Orders Details Patient Name: Date of Service: Alyssa Carey 03/14/2023 9:00 A M Medical Record Number: [id number] Patient Account Number: [id number] Date of Birth/Sex: Treating RN: [birth year] (52 y.o. Alyssa Carey Primary Care Provider: Donzetta Sprung Other Clinician: Referring Provider: Treating Provider/Extender: Alyssa Carey, A NNA Weeks in Treatment: 0 Verbal / Phone Orders: No BATH, Kaiyana Carey ([id number]) [id number] Page 4 of 10 Diagnosis Coding ICD-10 Coding Code Description [phone] Non-pressure chronic ulcer of buttock with fat layer exposed R19.7 Diarrhea, unspecified E46 Unspecified protein-calorie malnutrition K70.9 Alcoholic liver disease, unspecified Follow-up Appointments ppointment in 1 week. - Dr. Lady Gary RM 1 Return A Anesthetic Wound #1 Gluteal fold (In clinic) Topical Lidocaine 4% applied to wound bed Wound #2 Proximal Gluteal  fold (In clinic) Topical Lidocaine 4% applied to wound bed Bathing/ Shower/ Hygiene May shower and wash wound with soap and water. Other Bathing/Shower/Hygiene Orders/Instructions: - use sitz bath to clean rectal area after bowel movements Off-Loading Turn and reposition every 2 hours - try to sit on sides of buttocks Additional Orders / Instructions Follow Nutritious Diet - add protein to diet such as meats, chicken, eggs Wound Treatment Wound #1 - Gluteal fold Prim Dressing: Maxorb Extra Ag+ Alginate Dressing, 2x2 (in/in) 1 x Per Day/30 Days ary Discharge Instructions: Apply to wound bed as instructed Secondary Dressing: Woven Gauze Sponge, Non-Sterile 4x4 in 1 x Per Day/30 Days Discharge Instructions: Apply over primary dressing , roll and tape in place Secured With: 3M Medipore H Soft Cloth Surgical T ape, 4 x 10 (in/yd) 1 x Per Day/30 Days Discharge Instructions: Secure with tape as directed. Wound #2 - Gluteal fold Wound Laterality: Proximal Prim  Dressing: Maxorb Extra Ag+ Alginate Dressing, 2x2 (in/in) 1 x Per Day/30 Days ary Discharge Instructions: Apply to wound bed as instructed Secondary Dressing: Woven Gauze Sponge, Non-Sterile 4x4 in 1 x Per Day/30 Days Discharge Instructions: Apply over primary dressing , roll and tape in place Secured With: 45M Medipore H Soft Cloth Surgical T ape, 4 x 10 (in/yd) 1 x Per Day/30 Days Discharge Instructions: Secure with tape as directed. Patient Medications llergies: pantoprazole, penicillin, oxycodone A Notifications Medication Indication Start End prior to debridement 03/14/2023 lidocaine DOSE topical 4 % cream - cream topical Electronic Signature(s) Signed: 03/14/2023 3:38:16 PM By: Duanne Guess MD FACS Entered By: Duanne Guess on 03/14/2023 10:40:28 Alyssa Carey, Alyssa Carey ([id number]) [id number] Page 5 of 10 -------------------------------------------------------------------------------- Problem List  Details Patient Name: Date of Service: ONESHA, BALLER 03/14/2023 9:00 A M Medical Record Number: [id number] Patient Account Number: [id number] Date of Birth/Sex: Treating RN: 11-18-1970 (52 y.o. F) Primary Care Provider: Donzetta Sprung Other Clinician: Referring Provider: Treating Provider/Extender: Alyssa Carey, A NNA Weeks in Treatment: 0 Active Problems ICD-10 Encounter Code Description Active Date MDM Diagnosis L98.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 03/14/2023 No Yes R19.7 Diarrhea, unspecified 03/14/2023 No Yes E46 Unspecified protein-calorie malnutrition 03/14/2023 No Yes K70.9 Alcoholic liver disease, unspecified 03/14/2023 No Yes Inactive Problems Resolved Problems Electronic Signature(s) Signed: 03/14/2023 10:35:07 AM By: Duanne Guess MD FACS Previous Signature: 03/14/2023 8:56:07 AM Version By: Duanne Guess MD FACS Entered By: Duanne Guess on 03/14/2023 10:35:07 -------------------------------------------------------------------------------- Progress Note Details Patient Name: Date of Service: Alyssa Carey. 03/14/2023 9:00 A M Medical Record Number: [id number] Patient Account Number: [id number] Date of Birth/Sex: Treating RN: 06-Dec-1970 (52 y.o. F) Primary Care Provider: Donzetta Sprung Other Clinician: Referring Provider: Treating Provider/Extender: Alyssa Carey, A NNA Weeks in Treatment: 0 Subjective Chief Complaint Information obtained from Patient Patient seen for complaints of Non-Healing Wounds History of Present Illness (HPI) ADMISSION 03/14/2023 This is a 52 year old woman with a past medical history significant for necrotizing fasciitis for which she underwent extensive debridement in 2021. She ended up with a diverting loop colostomy which was subsequently reversed. She apparently has had issues with diarrhea ever since colostomy reversal and has breakdown on her buttocks as result of the  diarrhea. She is followed by gastroenterology for the diarrhea. She also has alcoholic cirrhosis with a number of related comorbidities. She has been hospitalized several times in the past couple of months due to GI bleeding, but none of this seems to be variceal, based on the electronic medical record. Patient History Information obtained from Patient, Chart. Alyssa Carey, Alyssa Carey ([id number]) [id number] Page 6 of 10 Allergies pantoprazole, penicillin (Reaction: hives), oxycodone (Reaction: rash) Family History Cancer - Mother, Hypertension - Mother, No family history of Diabetes, Heart Disease, Hereditary Spherocytosis, Kidney Disease, Lung Disease, Seizures, Stroke, Thyroid Problems, Tuberculosis. Social History Never smoker, Marital Status - Single, Alcohol Use - Never - quit 2 years ago, Drug Use - No History, Caffeine Use - Never. Medical History Cardiovascular Patient has history of Arrhythmia - SVT afib, Hypertension , Gastrointestinal Patient has history of Cirrhosis Endocrine Denies history of Type I Diabetes, Type II Diabetes Genitourinary Denies history of End Stage Renal Disease Integumentary (Skin) Denies history of History of Burn Psychiatric Patient has history of Anorexia/bulimia - poor appetite Denies history of Confinement Anxiety Hospitalization/Surgery History - EGD 01/17/2023. - colostomy takedown 01/26/2022. - colonoscopy. - flex sigmoidoscopy 12/2019. - IandD perirectal abscess 12/25/2019. - laproscopic loop  colostomy 12/25/2019. - tonsilectomy. Medical A Surgical History Notes nd Constitutional Symptoms (General Health) hx alcohol abuse, obesity Respiratory paralysis of right vocal cord Gastrointestinal SBO, GI bleed, PUD, gastritis and gastroduodenitis, hepatitis Integumentary (Skin) necrotizing fasciitis of pelvic region Review of Systems (ROS) Constitutional Symptoms (General Health) Denies complaints or symptoms of Fatigue,  Fever, Chills, Marked Weight Change. Eyes Denies complaints or symptoms of Dry Eyes, Vision Changes, Glasses / Contacts. Ear/Nose/Mouth/Throat Denies complaints or symptoms of Chronic sinus problems or rhinitis. Respiratory Denies complaints or symptoms of Chronic or frequent coughs, Shortness of Breath. Cardiovascular Denies complaints or symptoms of Chest pain. Gastrointestinal Complains or has symptoms of Frequent diarrhea. Denies complaints or symptoms of Nausea, Vomiting. Endocrine Denies complaints or symptoms of Heat/cold intolerance. Genitourinary Denies complaints or symptoms of Frequent urination. Integumentary (Skin) Complains or has symptoms of Wounds. Musculoskeletal Denies complaints or symptoms of Muscle Pain, Muscle Weakness. Neurologic Denies complaints or symptoms of Numbness/parasthesias. Psychiatric Denies complaints or symptoms of Claustrophobia. Objective Constitutional No acute distress. Vitals Time Taken: 9:00 AM, Temperature: 98.1 F, Pulse: 85 bpm, Respiratory Rate: 18 breaths/min, Blood Pressure: 126/86 mmHg. Respiratory Normal work of breathing on room air. General Notes: 03/14/2023: She has areas of skin breakdown along her natal cleft adjacent to her anus. There is breakdown in the skin overlying her coccyx as well as a small ulcer just caudal to this. This second ulcer has some undermining toward the anal area. All of this is found within the scar from her previous surgical debridement of necrotizing fasciitis. Integumentary (Hair, Skin) Alyssa Carey, Alyssa Carey ([id number]) [id number] Page 7 of 10 Wound #1 status is Open. Original cause of wound was Gradually Appeared. The date acquired was: 02/13/2023. The wound is located on the Gluteal fold. The wound measures 1.3cm length x 0.5cm width x 0.3cm depth; 0.511cm^2 area and 0.153cm^3 volume. There is Fat Layer (Subcutaneous Tissue) exposed. There is no tunneling noted, however,  there is undermining starting at 12:00 and ending at 6:00 with a maximum distance of 0.5cm. There is a medium amount of serosanguineous drainage noted. The wound margin is epibole. There is small (1-33%) pink granulation within the wound bed. There is a large (67-100%) amount of necrotic tissue within the wound bed including Adherent Slough. The periwound skin appearance had no abnormalities noted for moisture. The periwound skin appearance had no abnormalities noted for color. The periwound skin appearance exhibited: Scarring. Periwound temperature was noted as No Abnormality. The periwound has tenderness on palpation. Wound #2 status is Open. Original cause of wound was Gradually Appeared. The date acquired was: 02/13/2023. The wound is located on the Proximal Gluteal fold. The wound measures 0.9cm length x 0.4cm width x 0.1cm depth; 0.283cm^2 area and 0.028cm^3 volume. There is Fat Layer (Subcutaneous Tissue) exposed. There is no tunneling or undermining noted. There is a small amount of serosanguineous drainage noted. The wound margin is epibole. There is large (67-100%) red granulation within the wound bed. There is a small (1-33%) amount of necrotic tissue within the wound bed including Adherent Slough. The periwound skin appearance had no abnormalities noted for moisture. The periwound skin appearance had no abnormalities noted for color. The periwound skin appearance exhibited: Scarring. Periwound temperature was noted as No Abnormality. The periwound has tenderness on palpation. Assessment Active Problems ICD-10 Non-pressure chronic ulcer of buttock with fat layer exposed Diarrhea, unspecified Unspecified protein-calorie malnutrition Alcoholic liver disease, unspecified Procedures Wound #1 Pre-procedure diagnosis of Wound #1 is a Dehisced Wound located on the Gluteal fold . There  was a Selective/Open Wound Non-Viable Tissue Debridement with a total area of 0.51 sq cm performed by Duanne Guess, MD. With the following instrument(s): Curette to remove Non-Viable tissue/material. Material removed includes [facility] after achieving pain control using Lidocaine 4% Topical Solution. No specimens were taken. A time out was conducted at 09:45, prior to the start of the procedure. A Minimum amount of bleeding was controlled with Pressure. The procedure was tolerated well with a pain level of 5 throughout and a pain level of 5 following the procedure. Post Debridement Measurements: 1.3cm length x 0.5cm width x 0.3cm depth; 0.153cm^3 volume. Character of Wound/Ulcer Post Debridement is improved. Post procedure Diagnosis Wound #1: Same as Pre-Procedure General Notes: scribed for Dr. Lady Gary by Zenaida Deed, RN. Wound #2 Pre-procedure diagnosis of Wound #2 is a Dehisced Wound located on the Proximal Gluteal fold . There was a Selective/Open Wound Non-Viable Tissue Debridement with a total area of 0.28 sq cm performed by Duanne Guess, MD. With the following instrument(s): Curette to remove Non-Viable tissue/material. Material removed includes [facility] after achieving pain control using Lidocaine 4% Topical Solution. No specimens were taken. A time out was conducted at 09:45, prior to the start of the procedure. A Minimum amount of bleeding was controlled with Pressure. The procedure was tolerated well with a pain level of 5 throughout and a pain level of 5 following the procedure. Post Debridement Measurements: 0.9cm length x 0.4cm width x 0.1cm depth; 0.028cm^3 volume. Character of Wound/Ulcer Post Debridement is improved. Post procedure Diagnosis Wound #2: Same as Pre-Procedure General Notes: scribed for Dr. Lady Gary by Zenaida Deed, RN. Plan Follow-up Appointments: Return Appointment in 1 week. - Dr. Lady Gary RM 1 Anesthetic: Wound #1 Gluteal fold: (In clinic) Topical Lidocaine 4% applied to wound bed Wound #2 Proximal Gluteal fold: (In clinic) Topical Lidocaine 4% applied to wound  bed Bathing/ Shower/ Hygiene: May shower and wash wound with soap and water. Other Bathing/Shower/Hygiene Orders/Instructions: - use sitz bath to clean rectal area after bowel movements Off-Loading: Turn and reposition every 2 hours - try to sit on sides of buttocks Additional Orders / Instructions: Follow Nutritious Diet - add protein to diet such as meats, chicken, eggs The following medication(s) was prescribed: lidocaine topical 4 % cream cream topical for prior to debridement was prescribed at facility WOUND #1: - Gluteal fold Wound Laterality: Prim Dressing: Maxorb Extra Ag+ Alginate Dressing, 2x2 (in/in) 1 x Per Day/30 Days ary Discharge Instructions: Apply to wound bed as instructed Secondary Dressing: Woven Gauze Sponge, Non-Sterile 4x4 in 1 x Per Day/30 Days Discharge Instructions: Apply over primary dressing , roll and tape in place Secured With: 353M Medipore H Soft Cloth Surgical T ape, 4 x 10 (in/yd) 1 x Per Day/30 Days Discharge Instructions: Secure with tape as directed. WOUND #2: - Gluteal fold Wound Laterality: Proximal Prim Dressing: Maxorb Extra Ag+ Alginate Dressing, 2x2 (in/in) 1 x Per Day/30 Days ary Discharge Instructions: Apply to wound bed as instructed Alyssa Carey, Alyssa Carey ([id number]) [id number] Page 8 of 10 Secondary Dressing: Woven Gauze Sponge, Non-Sterile 4x4 in 1 x Per Day/30 Days Discharge Instructions: Apply over primary dressing , roll and tape in place Secured With: 353M Medipore H Soft Cloth Surgical T ape, 4 x 10 (in/yd) 1 x Per Day/30 Days Discharge Instructions: Secure with tape as directed. 03/14/2023: This is a 52 year old woman with a history of necrotizing fasciitis status post extensive surgical debridement. Since reversal of her loop colostomy, she has had significant diarrhea. She has areas of  skin breakdown along her natal cleft adjacent to her anus. There is breakdown in the skin overlying her coccyx as well as a small  ulcer just caudal to this. This second ulcer has some undermining toward the anal area. All of this is found within the scar from her previous surgical debridement of necrotizing fasciitis. I used a curette to debride slough off of the wound on her coccyx and the undermined wound near her anus. The small areas of skin breakdown did not require debridement. She has been applying a Desitin-like cream to this area and I think she can continue to do that. We will put silver alginate into the other 2 wounds and cover them with gauze and tape. We discussed alternative ways to stay clean including sitz baths, using a bidet or hand-held shower to clean after using the toilet. She is already using wipes rather than dry toilet paper. She will follow-up in 1 week. Electronic Signature(s) Signed: 03/14/2023 11:06:14 AM By: Duanne Guess MD FACS Previous Signature: 03/14/2023 10:42:15 AM Version By: Duanne Guess MD FACS Entered By: Duanne Guess on 03/14/2023 11:06:13 -------------------------------------------------------------------------------- HxROS Details Patient Name: Date of Service: Alyssa Carey. 03/14/2023 9:00 A M Medical Record Number: [id number] Patient Account Number: [id number] Date of Birth/Sex: Treating RN: February 28, 1971 (52 y.o. Alyssa Carey Primary Care Provider: Donzetta Sprung Other Clinician: Referring Provider: Treating Provider/Extender: Alyssa Carey, A NNA Weeks in Treatment: 0 Information Obtained From Patient Chart Constitutional Symptoms (General Health) Complaints and Symptoms: Negative for: Fatigue; Fever; Chills; Marked Weight Change Medical History: Past Medical History Notes: hx alcohol abuse, obesity Eyes Complaints and Symptoms: Negative for: Dry Eyes; Vision Changes; Glasses / Contacts Ear/Nose/Mouth/Throat Complaints and Symptoms: Negative for: Chronic sinus problems or rhinitis Respiratory Complaints and Symptoms: Negative for:  Chronic or frequent coughs; Shortness of Breath Medical History: Past Medical History Notes: paralysis of right vocal cord Cardiovascular Complaints and Symptoms: Negative for: Chest pain Medical History: Positive for: Arrhythmia - SVT afib; Hypertension , Alyssa Carey, Alyssa Carey ([id number]) [id number] Page 9 of 10 Gastrointestinal Complaints and Symptoms: Positive for: Frequent diarrhea Negative for: Nausea; Vomiting Medical History: Positive for: Cirrhosis Past Medical History Notes: SBO, GI bleed, PUD, gastritis and gastroduodenitis, hepatitis Endocrine Complaints and Symptoms: Negative for: Heat/cold intolerance Medical History: Negative for: Type I Diabetes; Type II Diabetes Genitourinary Complaints and Symptoms: Negative for: Frequent urination Medical History: Negative for: End Stage Renal Disease Integumentary (Skin) Complaints and Symptoms: Positive for: Wounds Medical History: Negative for: History of Burn Past Medical History Notes: necrotizing fasciitis of pelvic region Musculoskeletal Complaints and Symptoms: Negative for: Muscle Pain; Muscle Weakness Neurologic Complaints and Symptoms: Negative for: Numbness/parasthesias Psychiatric Complaints and Symptoms: Negative for: Claustrophobia Medical History: Positive for: Anorexia/bulimia - poor appetite Negative for: Confinement Anxiety Hematologic/Lymphatic Immunological Oncologic Immunizations Pneumococcal Vaccine: Received Pneumococcal Vaccination: Yes Received Pneumococcal Vaccination On or After 60th Birthday: No Implantable Devices No devices added Hospitalization / Surgery History Type of Hospitalization/Surgery EGD 01/17/2023 colostomy takedown 01/26/2022 colonoscopy flex sigmoidoscopy 12/2019 IandD perirectal abscess 12/25/2019 laproscopic loop colostomy 12/25/2019 Alyssa Carey, Alyssa Carey ([id number]) [phone].pdf Page 10 of 10 tonsilectomy Family  and Social History Cancer: Yes - Mother; Diabetes: No; Heart Disease: No; Hereditary Spherocytosis: No; Hypertension: Yes - Mother; Kidney Disease: No; Lung Disease: No; Seizures: No; Stroke: No; Thyroid Problems: No; Tuberculosis: No; Never smoker; Marital Status - Single; Alcohol Use: Never - quit 2 years ago; Drug Use: No History; Caffeine Use: Never; Financial Concerns: No; Food, Clothing or Shelter Needs: No;  Support System Lacking: No; Transportation Concerns: No Electronic Signature(s) Signed: 03/14/2023 3:38:16 PM By: Duanne Guess MD FACS Signed: 03/14/2023 4:47:14 PM By: Zenaida Deed RN, BSN Entered By: Zenaida Deed on 03/14/2023 09:05:27 -------------------------------------------------------------------------------- SuperBill Details Patient Name: Date of Service: Alyssa Carey, Alyssa Carey 03/14/2023 Medical Record Number: [id number] Patient Account Number: [id number] Date of Birth/Sex: Treating RN: [birth year] (52 y.o. 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 Diarrhea, unspecified E46 Unspecified protein-calorie malnutrition K70.9 Alcoholic liver disease, unspecified Facility Procedures : CPT4 Code: [id number] Description: 99213 - WOUND CARE VISIT-LEV 3 EST PT Modifier: 25 Quantity: 1 : CPT4 Code: [id number] Description: 97597 - DEBRIDE WOUND 1ST 20 SQ CM OR < ICD-10 Diagnosis Description L98.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 Modifier: Quantity: 1 Physician Procedures : CPT4 Code Description Modifier [id number] 99204 - WC PHYS LEVEL 4 - NEW PT 25 ICD-10 Diagnosis Description 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

## 2023-03-15 ENCOUNTER — Encounter (HOSPITAL_COMMUNITY): Payer: Self-pay | Admitting: Gastroenterology

## 2023-03-15 DIAGNOSIS — I872 Venous insufficiency (chronic) (peripheral): Secondary | ICD-10-CM | POA: Diagnosis not present

## 2023-03-16 NOTE — Anesthesia Postprocedure Evaluation (Signed)
Anesthesia Post Note  Patient: IRISA LECLEAR  Procedure(s) Performed: ESOPHAGOGASTRODUODENOSCOPY (EGD) WITH PROPOFOL COLONOSCOPY WITH PROPOFOL POLYPECTOMY BIOPSY  Patient location during evaluation: Phase II Anesthesia Type: General Level of consciousness: awake Pain management: pain level controlled Vital Signs Assessment: post-procedure vital signs reviewed and stable Respiratory status: spontaneous breathing and respiratory function stable Cardiovascular status: blood pressure returned to baseline and stable Postop Assessment: no headache and no apparent nausea or vomiting Anesthetic complications: no Comments: Late entry   No notable events documented.   Last Vitals:  Vitals:   03/10/23 1120 03/10/23 1215  BP: (!) 144/81 (!) 106/93  Pulse: 76   Resp: (!) 21 18  Temp: 36.8 C 36.6 C  SpO2: 100% 100%    Last Pain:  Vitals:   03/10/23 1215  TempSrc: Oral  PainSc:                  Windell Norfolk

## 2023-03-23 ENCOUNTER — Encounter (HOSPITAL_BASED_OUTPATIENT_CLINIC_OR_DEPARTMENT_OTHER): Payer: Medicaid Other | Attending: General Surgery | Admitting: General Surgery

## 2023-03-23 DIAGNOSIS — I10 Essential (primary) hypertension: Secondary | ICD-10-CM | POA: Diagnosis not present

## 2023-03-23 DIAGNOSIS — K7030 Alcoholic cirrhosis of liver without ascites: Secondary | ICD-10-CM | POA: Insufficient documentation

## 2023-03-23 DIAGNOSIS — Z6833 Body mass index (BMI) 33.0-33.9, adult: Secondary | ICD-10-CM | POA: Diagnosis not present

## 2023-03-23 DIAGNOSIS — I4891 Unspecified atrial fibrillation: Secondary | ICD-10-CM | POA: Insufficient documentation

## 2023-03-23 DIAGNOSIS — E669 Obesity, unspecified: Secondary | ICD-10-CM | POA: Diagnosis not present

## 2023-03-23 DIAGNOSIS — R197 Diarrhea, unspecified: Secondary | ICD-10-CM | POA: Diagnosis not present

## 2023-03-23 DIAGNOSIS — L98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: Secondary | ICD-10-CM | POA: Insufficient documentation

## 2023-03-23 DIAGNOSIS — E46 Unspecified protein-calorie malnutrition: Secondary | ICD-10-CM | POA: Insufficient documentation

## 2023-03-23 NOTE — Progress Notes (Signed)
Sindee, Lichtenberg Wavie Carey ([id number]) [id number] Page 1 of 8 Visit Report for 03/23/2023 Arrival Information Details Patient Name: Date of Service: Alyssa Carey, Alyssa Carey 03/23/2023 8:45 A M Medical Record Number: [id number] Patient Account Number: [id number] Date of Birth/Sex: Treating RN: [birth year] (52 y.o. F) Primary Care Mainor Hellmann: Donzetta Sprung Other Clinician: Referring Talvin Christianson: Treating Zackarey Holleman/Extender: Marcelle Overlie in Treatment: 1 Visit Information History Since Last Visit All ordered tests and consults were completed: No Patient Arrived: Ambulatory Added or deleted any medications: Yes Arrival Time: 09:03 Any new allergies or adverse reactions: No Accompanied By: self Had a fall or experienced change in No Transfer Assistance: None activities of daily living that may affect Patient Identification Verified: Yes risk of falls: Secondary Verification Process Completed: Yes Signs or symptoms of abuse/neglect since last visito No Patient Requires Transmission-Based Precautions: No Hospitalized since last visit: No Patient Has Alerts: No Implantable device outside of the clinic excluding No cellular tissue based products placed in the center since last visit: Has Dressing in Place as Prescribed: Yes Pain Present Now: Yes Electronic Signature(s) Signed: 03/23/2023 1:19:41 PM By: Zenaida Deed RN, BSN Entered By: Zenaida Deed on 03/23/2023 78:29:56 -------------------------------------------------------------------------------- Encounter Discharge Information Details Patient Name: Date of Service: Alyssa Carey 03/23/2023 8:45 A M Medical Record Number: [id number] Patient Account Number: [id number] Date of Birth/Sex: Treating RN: Mar 30, 1971 (52 y.o. [name] Primary Care Brunilda Eble: Donzetta Sprung Other Clinician: Referring Adely Facer: Treating Neeley Sedivy/Extender: Marcelle Overlie in Treatment:  1 Encounter Discharge Information Items Post Procedure Vitals Discharge Condition: Stable Temperature (F): 98.5 Ambulatory Status: Ambulatory Pulse (bpm): 116 Discharge Destination: Home Respiratory Rate (breaths/min): 18 Transportation: Private Auto Blood Pressure (mmHg): 107/74 Accompanied By: self Schedule Follow-up Appointment: Yes Clinical Summary of Care: Patient Declined Electronic Signature(s) Signed: 03/23/2023 1:19:41 PM By: Zenaida Deed RN, BSN Entered By: Zenaida Deed on 03/23/2023 06:49:36 Alyssa Carey, Alyssa Carey ([id number]) [id number] Page 2 of 8 -------------------------------------------------------------------------------- Lower Extremity Assessment Details Patient Name: Date of Service: EMMELY, LAINO 03/23/2023 8:45 A M Medical Record Number: [id number] Patient Account Number: [id number] Date of Birth/Sex: Treating RN: 1970/09/06 (52 y.o. [name] Primary Care Walaa Carel: Donzetta Sprung Other Clinician: Referring Jaisen Wiltrout: Treating Clement Deneault/Extender: Marcelle Overlie in Treatment: 1 Electronic Signature(s) Signed: 03/23/2023 1:19:41 PM By: Zenaida Deed RN, BSN Entered By: Zenaida Deed on 03/23/2023 06:23:01 -------------------------------------------------------------------------------- Multi Wound Chart Details Patient Name: Date of Service: Alyssa Carey 03/23/2023 8:45 A M Medical Record Number: [id number] Patient Account Number: [id number] Date of Birth/Sex: Treating RN: 25-Feb-1971 (52 y.o. [name] Primary Care Kytzia Gienger: Donzetta Sprung Other Clinician: Referring Laikyn Gewirtz: Treating Rihaan Barrack/Extender: Marcelle Overlie in Treatment: 1 Vital Signs Height(in): 61 Pulse(bpm): 116 Weight(lbs): 178 Blood Pressure(mmHg): 107/74 Body Mass Index(BMI): 33.6 Temperature(F): 98.5 Respiratory Rate(breaths/min): 18 [1:Photos:] [2:No Photos] [N/A:N/A] Gluteal fold Proximal  Gluteal fold N/A Wound Location: Gradually Appeared Gradually Appeared N/A Wounding Event: Abrasion Abrasion N/A Primary Etiology: Arrhythmia, Hypertension, Cirrhosis , Arrhythmia, Hypertension, Cirrhosis , N/A Comorbid History: Anorexia/bulimia Anorexia/bulimia 02/13/2023 02/13/2023 N/A Date Acquired: 1 1 N/A Weeks of Treatment: Open Open N/A Wound Status: No No N/A Wound Recurrence: 1.5x0.6x0.3 1x0.5x0.1 N/A Measurements Carey x W x D (cm) 0.707 0.393 N/A A (cm) : rea 0.212 0.039 N/A Volume (cm) : -38.40% -38.90% N/A % Reduction in A rea: -38.60% -39.30% N/A % Reduction in Volume: 6 Starting Position 1 (o'clock): 12 Ending Position 1 (o'clock): 0.7 Maximum Distance 1 (cm): Yes No N/A Undermining: Full Thickness  Without Exposed Full Thickness Without Exposed N/A Classification: Support Structures Support Structures Medium Small N/A Exudate Amount: Serosanguineous Serosanguineous N/A Exudate TypeTylor Carey, Alyssa Carey ([id number]) [phone].pdf Page 3 of 8 red, brown red, brown N/A Exudate Color: Epibole Epibole N/A Wound Margin: Medium (34-66%) Large (67-100%) N/A Granulation Amount: Pink Red N/A Granulation Quality: Medium (34-66%) Small (1-33%) N/A Necrotic Amount: Fat Layer (Subcutaneous Tissue): Yes Fat Layer (Subcutaneous Tissue): Yes N/A Exposed Structures: Fascia: No Fascia: No Tendon: No Tendon: No Muscle: No Muscle: No Joint: No Joint: No Bone: No Bone: No None None N/A Epithelialization: Scarring: Yes Scarring: Yes N/A Periwound Skin Texture: No Abnormalities Noted No Abnormalities Noted N/A Periwound Skin Moisture: No Abnormalities Noted No Abnormalities Noted N/A Periwound Skin Color: No Abnormality No Abnormality N/A Temperature: Yes Yes N/A Tenderness on Palpation: Treatment Notes Electronic Signature(s) Signed: 03/23/2023 9:31:29 AM By: Duanne Guess MD FACS Signed: 03/23/2023 1:19:41 PM By: Zenaida Deed  RN, BSN Entered By: Duanne Guess on 03/23/2023 06:31:29 -------------------------------------------------------------------------------- Multi-Disciplinary Care Plan Details Patient Name: Date of Service: Alyssa Carey. 03/23/2023 8:45 A M Medical Record Number: [id number] Patient Account Number: [id number] Date of Birth/Sex: Treating RN: [birth year] (52 y.o. [name] Primary Care Gearld Kerstein: Donzetta Sprung Other Clinician: Referring Danisa Kopec: Treating Diontay Rosencrans/Extender: Marcelle Overlie in Treatment: 1 Multidisciplinary Care Plan reviewed with physician Active Inactive Nutrition Nursing Diagnoses: Impaired glucose control: actual or potential Goals: Patient/caregiver agrees to and verbalizes understanding of need to use nutritional supplements and/or vitamins as prescribed Date Initiated: 03/14/2023 Target Resolution Date: 04/11/2023 Goal Status: Active Interventions: Assess patient nutrition upon admission and as needed per policy Treatment Activities: Patient referred to Primary Care Physician for further nutritional evaluation : 03/14/2023 Notes: Wound/Skin Impairment Nursing Diagnoses: Impaired tissue integrity Knowledge deficit related to ulceration/compromised skin integrity Goals: Patient/caregiver will verbalize understanding of skin care regimen Date Initiated: 03/14/2023 Target Resolution Date: 04/11/2023 Goal Status: Active Ulcer/skin breakdown will have a volume reduction of 30% by week 4 Date Initiated: 03/14/2023 Target Resolution Date: 04/11/2023 PITA, MACFADYEN ([id number]) [phone].pdf Page 4 of 8 Goal Status: Active Interventions: Assess patient/caregiver ability to obtain necessary supplies Assess patient/caregiver ability to perform ulcer/skin care regimen upon admission and as needed Assess ulceration(s) every visit Provide education on ulcer and skin care Treatment Activities: Skin care regimen  initiated : 03/14/2023 Topical wound management initiated : 03/14/2023 Notes: Electronic Signature(s) Signed: 03/23/2023 1:19:41 PM By: Zenaida Deed RN, BSN Entered By: Zenaida Deed on 03/23/2023 99:83:38 -------------------------------------------------------------------------------- Pain Assessment Details Patient Name: Date of Service: Alyssa Carey 03/23/2023 8:45 A M Medical Record Number: [id number] Patient Account Number: [id number] Date of Birth/Sex: Treating RN: [birth year] (52 y.o. F) Primary Care Joycelynn Fritsche: Donzetta Sprung Other Clinician: Referring Herberth Deharo: Treating Taunia Frasco/Extender: Marcelle Overlie in Treatment: 1 Active Problems Location of Pain Severity and Description of Pain Patient Has Paino Yes Site Locations Pain Location: Pain in Ulcers With Dressing Change: Yes Duration of the Pain. Constant / Intermittento Intermittent Rate the pain. Current Pain Level: 6 Worst Pain Level: 10 Least Pain Level: 0 Tolerable Pain Level: 2 Character of Pain Describe the Pain: Aching Pain Management and Medication Current Pain Management: Medication: Yes Other: reposition Is the Current Pain Management Adequate: Adequate How does your wound impact your activities of daily livingo Sleep: Yes Work: Yes Toileting: Yes Hobbies: Manufacturing systems engineer) Signed: 03/23/2023 1:19:41 PM By: Zenaida Deed RN, BSN Entered By: Zenaida Deed on 03/23/2023 06:22:51 Alyssa Carey, Alyssa Carey ([id number]) [id number] Page 5 of 8 --------------------------------------------------------------------------------  Patient/Caregiver Education Details Patient Name: Date of Service: YASMIM, GILLOTT 9/6/2024andnbsp8:45 A M Medical Record Number: [id number] Patient Account Number: [id number] Date of Birth/Gender: Treating RN: 11/27/1970 (52 y.o. [name] Primary Care Physician: Donzetta Sprung Other Clinician: Referring  Physician: Treating Physician/Extender: Marcelle Overlie in Treatment: 1 Education Assessment Education Provided To: Patient Education Topics Provided Wound Debridement: Methods: Explain/Verbal Responses: Reinforcements needed, State content correctly Wound/Skin Impairment: Methods: Explain/Verbal Responses: Reinforcements needed, State content correctly Electronic Signature(s) Signed: 03/23/2023 1:19:41 PM By: Zenaida Deed RN, BSN Entered By: Zenaida Deed on 03/23/2023 06:29:34 -------------------------------------------------------------------------------- Wound Assessment Details Patient Name: Date of Service: Alyssa Carey 03/23/2023 8:45 A M Medical Record Number: [id number] Patient Account Number: [id number] Date of Birth/Sex: Treating RN: 1970/10/15 (52 y.o. F) Primary Care Donnice Nielsen: Donzetta Sprung Other Clinician: Referring Camp Gopal: Treating Sheila Ocasio/Extender: Marcelle Overlie in Treatment: 1 Wound Status Wound Number: 1 Primary Etiology: Abrasion Wound Location: Gluteal fold Wound Status: Open Wounding Event: Gradually Appeared Comorbid History: Arrhythmia, Hypertension, Cirrhosis , Anorexia/bulimia Date Acquired: 02/13/2023 Weeks Of Treatment: 1 Clustered Wound: No Photos Alyssa Carey, Alyssa Carey ([id number]) [id number] Page 6 of 8 Wound Measurements Length: (cm) 1.5 Width: (cm) 0.6 Depth: (cm) 0.3 Area: (cm) 0.707 Volume: (cm) 0.212 % Reduction in Area: -38.4% % Reduction in Volume: -38.6% Epithelialization: None Tunneling: No Undermining: Yes Starting Position (o'clock): 6 Ending Position (o'clock): 12 Maximum Distance: (cm) 0.7 Wound Description Classification: Full Thickness Without Exposed Support Structures Wound Margin: Epibole Exudate Amount: Medium Exudate Type: Serosanguineous Exudate Color: red, brown Foul Odor After Cleansing: No Slough/Fibrino Yes Wound Bed Granulation  Amount: Medium (34-66%) Exposed Structure Granulation Quality: Pink Fascia Exposed: No Necrotic Amount: Medium (34-66%) Fat Layer (Subcutaneous Tissue) Exposed: Yes Necrotic Quality: Adherent Slough Tendon Exposed: No Muscle Exposed: No Joint Exposed: No Bone Exposed: No Periwound Skin Texture Texture Color No Abnormalities Noted: No No Abnormalities Noted: Yes Scarring: Yes Temperature / Pain Temperature: No Abnormality Moisture No Abnormalities Noted: Yes Tenderness on Palpation: Yes Treatment Notes Wound #1 (Gluteal fold) Cleanser Peri-Wound Care Topical Primary Dressing Maxorb Extra Ag+ Alginate Dressing, 2x2 (in/in) Discharge Instruction: Apply to wound bed as instructed Secondary Dressing Woven Gauze Sponge, Non-Sterile 4x4 in Discharge Instruction: Apply over primary dressing , roll and tape in place Secured With 64M Medipore H Soft Cloth Surgical T ape, 4 x 10 (in/yd) Discharge Instruction: Secure with tape as directed. Compression Wrap Compression Stockings Add-Ons Alyssa Carey, Alyssa Carey ([id number]) [id number] Page 7 of 8 Electronic Signature(s) Signed: 03/23/2023 1:19:41 PM By: Zenaida Deed RN, BSN Entered By: Zenaida Deed on 03/23/2023 06:27:12 -------------------------------------------------------------------------------- Wound Assessment Details Patient Name: Date of Service: Alyssa Carey 03/23/2023 8:45 A M Medical Record Number: [id number] Patient Account Number: [id number] Date of Birth/Sex: Treating RN: 1971/07/09 (52 y.o. F) Primary Care Alireza Pollack: Donzetta Sprung Other Clinician: Referring Lyle Niblett: Treating Damary Doland/Extender: Marcelle Overlie in Treatment: 1 Wound Status Wound Number: 2 Primary Etiology: Abrasion Wound Location: Proximal Gluteal fold Wound Status: Open Wounding Event: Gradually Appeared Comorbid History: Arrhythmia, Hypertension, Cirrhosis , Anorexia/bulimia Date Acquired:  02/13/2023 Weeks Of Treatment: 1 Clustered Wound: No Photos Wound Measurements Length: (cm) 1 Width: (cm) 0.5 Depth: (cm) 0.1 Area: (cm) 0.393 Volume: (cm) 0.039 % Reduction in Area: -38.9% % Reduction in Volume: -39.3% Epithelialization: None Wound Description Classification: Full Thickness Without Exposed Suppor Wound Margin: Epibole Exudate Amount: Small Exudate Type: Serosanguineous Exudate Color: red, brown t Structures Foul Odor After Cleansing: No Slough/Fibrino Yes Wound Bed Granulation Amount:  Large (67-100%) Exposed Structure Granulation Quality: Red Fascia Exposed: No Necrotic Amount: Small (1-33%) Fat Layer (Subcutaneous Tissue) Exposed: Yes Necrotic Quality: Adherent Slough Tendon Exposed: No Muscle Exposed: No Joint Exposed: No Bone Exposed: No Periwound Skin Texture Texture Color No Abnormalities Noted: No No Abnormalities Noted: Yes Scarring: Yes Temperature / Pain Temperature: No Abnormality Moisture No Abnormalities Noted: Yes Tenderness on Palpation: Yes Alyssa Carey, Alyssa Carey ([id number]) [id number] Page 8 of 8 Treatment Notes Wound #2 (Gluteal fold) Wound Laterality: Proximal Cleanser Peri-Wound Care Topical Primary Dressing Maxorb Extra Ag+ Alginate Dressing, 2x2 (in/in) Discharge Instruction: Apply to wound bed as instructed Secondary Dressing Woven Gauze Sponge, Non-Sterile 4x4 in Discharge Instruction: Apply over primary dressing , roll and tape in place Secured With 5M Medipore H Soft Cloth Surgical T ape, 4 x 10 (in/yd) Discharge Instruction: Secure with tape as directed. Compression Wrap Compression Stockings Add-Ons Electronic Signature(s) Signed: 03/23/2023 10:07:42 AM By: Dayton Scrape Entered By: Dayton Scrape on 03/23/2023 06:39:20 -------------------------------------------------------------------------------- Vitals Details Patient Name: Date of Service: Alyssa Carey. 03/23/2023 8:45 A M Medical  Record Number: [id number] Patient Account Number: [id number] Date of Birth/Sex: Treating RN: April 27, 1971 (52 y.o. F) Primary Care Renay Crammer: Donzetta Sprung Other Clinician: Referring Shaquanna Lycan: Treating Arrington Yohe/Extender: Marcelle Overlie in Treatment: 1 Vital Signs Time Taken: 09:04 Temperature (F): 98.5 Height (in): 61 Pulse (bpm): 116 Weight (lbs): 178 Respiratory Rate (breaths/min): 18 Body Mass Index (BMI): 33.6 Blood Pressure (mmHg): 107/74 Reference Range: 80 - 120 mg / dl Electronic Signature(s) Signed: 03/23/2023 1:19:41 PM By: Zenaida Deed RN, BSN Entered By: Zenaida Deed on 03/23/2023 01:02:72

## 2023-03-23 NOTE — Progress Notes (Addendum)
Alyssa, Diner Tiena Carey ([id number]) [id number] Page 1 of 9 Visit Report for 03/23/2023 Chief Complaint Document Details Patient Name: Date of Service: Alyssa Carey, Alyssa Carey 03/23/2023 8:45 A M Medical Record Number: [id number] Patient Account Number: [id number] Date of Birth/Sex: Treating RN: 04-Mar-1971 (52 y.o. [name] Primary Care Provider: Donzetta Sprung Other Clinician: Referring Provider: Treating Provider/Extender: Marcelle Overlie in Treatment: 1 Information Obtained from: Patient Chief Complaint Patient seen for complaints of Non-Healing Wounds Electronic Signature(s) Signed: 03/23/2023 9:31:35 AM By: Duanne Guess MD FACS Entered By: Duanne Guess on 03/23/2023 06:31:35 -------------------------------------------------------------------------------- Debridement Details Patient Name: Date of Service: Alyssa Carey 03/23/2023 8:45 A M Medical Record Number: [id number] Patient Account Number: [id number] Date of Birth/Sex: Treating RN: 04-04-1971 (52 y.o. [name] Primary Care Provider: Donzetta Sprung Other Clinician: Referring Provider: Treating Provider/Extender: Marcelle Overlie in Treatment: 1 Debridement Performed for Assessment: Wound #1 Gluteal fold Performed By: Physician Duanne Guess, MD Debridement Type: Debridement Level of Consciousness (Pre-procedure): Awake and Alert Pre-procedure Verification/Time Out Yes - 09:30 Taken: Start Time: 09:33 Pain Control: Lidocaine 4% T opical Solution Percent of Wound Bed Debrided: 100% T Area Debrided (cm): otal 0.71 Tissue and other material debrided: Non-Viable, Slough, Skin: Epidermis, Slough Level: Skin/Epidermis Debridement Description: Selective/Open Wound Instrument: Curette Bleeding: Minimum Hemostasis Achieved: Pressure Procedural Pain: 4 Post Procedural Pain: 2 Response to Treatment: Procedure was tolerated well Level of  Consciousness (Post- Awake and Alert procedure): Post Debridement Measurements of Total Wound Length: (cm) 1.5 Width: (cm) 0.6 Depth: (cm) 0.1 Volume: (cm) 0.071 Character of Wound/Ulcer Post Debridement: Stable Post Procedure Diagnosis Macmillan, Thecla Carey ([id number]) [id number] Page 2 of 9 Same as Pre-procedure Notes scribed for Dr. Lady Gary by Zenaida Deed, RN Electronic Signature(s) Signed: 03/23/2023 10:59:01 AM By: Duanne Guess MD FACS Signed: 03/23/2023 1:19:41 PM By: Zenaida Deed RN, BSN Entered By: Zenaida Deed on 03/23/2023 06:39:09 -------------------------------------------------------------------------------- Debridement Details Patient Name: Date of Service: Alyssa Carey 03/23/2023 8:45 A M Medical Record Number: [id number] Patient Account Number: [id number] Date of Birth/Sex: Treating RN: [birth year] (52 y.o. [name] Primary Care Provider: Donzetta Sprung Other Clinician: Referring Provider: Treating Provider/Extender: Marcelle Overlie in Treatment: 1 Debridement Performed for Assessment: Wound #2 Proximal Gluteal fold Performed By: Physician Duanne Guess, MD Debridement Type: Debridement Level of Consciousness (Pre-procedure): Awake and Alert Pre-procedure Verification/Time Out Yes - 09:30 Taken: Start Time: 09:33 Pain Control: Lidocaine 4% T opical Solution Percent of Wound Bed Debrided: 100% T Area Debrided (cm): otal 0.59 Tissue and other material debrided: Non-Viable, Slough, Skin: Epidermis, Slough Level: Skin/Epidermis Debridement Description: Selective/Open Wound Instrument: Curette Bleeding: Minimum Hemostasis Achieved: Pressure Procedural Pain: 4 Post Procedural Pain: 2 Response to Treatment: Procedure was tolerated well Level of Consciousness (Post- Awake and Alert procedure): Post Debridement Measurements of Total Wound Length: (cm) 1.5 Width: (cm) 0.5 Depth: (cm)  0.1 Volume: (cm) 0.059 Character of Wound/Ulcer Post Debridement: Stable Post Procedure Diagnosis Same as Pre-procedure Notes scribed for Dr. Lady Gary by Zenaida Deed, RN Electronic Signature(s) Signed: 03/23/2023 10:59:01 AM By: Duanne Guess MD FACS Signed: 03/23/2023 1:19:41 PM By: Zenaida Deed RN, BSN Entered By: Zenaida Deed on 03/23/2023 06:40:12 Scalici, Mateya Carey ([id number]) [id number] Page 3 of 9 -------------------------------------------------------------------------------- HPI Details Patient Name: Date of Service: Alyssa, Carey 03/23/2023 8:45 A M Medical Record Number: [id number] Patient Account Number: [id number] Date of Birth/Sex: Treating RN: 1970/08/09 (52 y.o. [name] Primary Care Provider: Donzetta Sprung Other Clinician: Referring Provider:  Treating Provider/Extender: Marcelle Overlie in Treatment: 1 History of Present Illness HPI Description: ADMISSION 03/14/2023 This is a 52 year old woman with a past medical history significant for necrotizing fasciitis for which she underwent extensive debridement in 2021. She ended up with a diverting loop colostomy which was subsequently reversed. She apparently has had issues with diarrhea ever since colostomy reversal and has breakdown on her buttocks as result of the diarrhea. She is followed by gastroenterology for the diarrhea. She also has alcoholic cirrhosis with a number of related comorbidities. She has been hospitalized several times in the past couple of months due to GI bleeding, but none of this seems to be variceal, based on the electronic medical record. 03/23/2023: The wounds are measuring about the same size, but she says that they are less painful. The wound edges are little bit scrolled over and the surface tissue has thin slough and remains a bit pale. Her GI doctor increased her cholestyramine and her diarrhea is improving. Electronic  Signature(s) Signed: 03/23/2023 10:08:59 AM By: Duanne Guess MD FACS Previous Signature: 03/23/2023 9:41:01 AM Version By: Duanne Guess MD FACS Entered By: Duanne Guess on 03/23/2023 07:08:59 -------------------------------------------------------------------------------- Physical Exam Details Patient Name: Date of Service: Alyssa Carey 03/23/2023 8:45 A M Medical Record Number: [id number] Patient Account Number: [id number] Date of Birth/Sex: Treating RN: 05-09-1971 (52 y.o. [name] Primary Care Provider: Donzetta Sprung Other Clinician: Referring Provider: Treating Provider/Extender: Marcelle Overlie in Treatment: 1 Constitutional .Tachycardic, asymptomatic. . . no acute distress. Respiratory Normal work of breathing on room air. Notes 03/23/2023: The wounds are measuring about the same size, but she says that they are less painful. The wound edges are little bit scrolled over and the surface tissue has thin slough and remains a bit pale. Electronic Signature(s) Signed: 03/23/2023 10:10:00 AM By: Duanne Guess MD FACS Entered By: Duanne Guess on 03/23/2023 07:10:00 -------------------------------------------------------------------------------- Physician Orders Details Patient Name: Date of Service: Alyssa Carey 03/23/2023 8:45 A M Medical Record Number: [id number] Patient Account Number: [id number] Date of Birth/Sex: Treating RN: July 01, 1971 (52 y.o. [name] Primary Care Provider: Donzetta Sprung Other Clinician: Referring Provider: Treating Provider/Extender: Marcelle Overlie in Treatment: 1 Whichard, Amerika Carey ([id number]) [id number] Page 4 of 9 Verbal / Phone Orders: No Diagnosis Coding ICD-10 Coding Code Description L98.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 R19.7 Diarrhea, unspecified E46 Unspecified protein-calorie malnutrition K70.9  Alcoholic liver disease, unspecified Follow-up Appointments ppointment in 1 week. - Dr. Lady Gary RM 1 Return A Friday 9/13 @ 0845 am Anesthetic Wound #1 Gluteal fold (In clinic) Topical Lidocaine 4% applied to wound bed Wound #2 Proximal Gluteal fold (In clinic) Topical Lidocaine 4% applied to wound bed Bathing/ Shower/ Hygiene May shower and wash wound with soap and water. Other Bathing/Shower/Hygiene Orders/Instructions: - use sitz bath to clean rectal area after bowel movements Off-Loading Turn and reposition every 2 hours - try to sit on sides of buttocks Additional Orders / Instructions Follow Nutritious Diet - add protein to diet such as meats, chicken, eggs Juven Shake 1-2 times daily. - juven 2 times per day for 2 weeks Wound Treatment Wound #1 - Gluteal fold Prim Dressing: Maxorb Extra Ag+ Alginate Dressing, 2x2 (in/in) 1 x Per Day/30 Days ary Discharge Instructions: Apply to wound bed as instructed Secondary Dressing: Woven Gauze Sponge, Non-Sterile 4x4 in 1 x Per Day/30 Days Discharge Instructions: Apply over primary dressing , roll and tape in place Secured  With: 38M Medipore H Soft Cloth Surgical T ape, 4 x 10 (in/yd) 1 x Per Day/30 Days Discharge Instructions: Secure with tape as directed. Wound #2 - Gluteal fold Wound Laterality: Proximal Prim Dressing: Maxorb Extra Ag+ Alginate Dressing, 2x2 (in/in) 1 x Per Day/30 Days ary Discharge Instructions: Apply to wound bed as instructed Secondary Dressing: Woven Gauze Sponge, Non-Sterile 4x4 in 1 x Per Day/30 Days Discharge Instructions: Apply over primary dressing , roll and tape in place Secured With: 38M Medipore H Soft Cloth Surgical T ape, 4 x 10 (in/yd) 1 x Per Day/30 Days Discharge Instructions: Secure with tape as directed. Electronic Signature(s) Signed: 03/23/2023 10:59:01 AM By: Duanne Guess MD FACS Entered By: Duanne Guess on 03/23/2023  07:11:25 -------------------------------------------------------------------------------- Problem List Details Patient Name: Date of Service: Alyssa Carey 03/23/2023 8:45 A M Medical Record Number: [id number] Patient Account Number: [id number] Date of Birth/Sex: Treating RN: 05/06/1971 (52 y.o. [name] Primary Care Provider: Donzetta Sprung Other Clinician: Referring Provider: Treating Provider/Extender: Marella Bile George Mason, Saina Carey ([id number]) [id number] Page 5 of 9 Weeks in Treatment: 1 Active Problems ICD-10 Encounter Code Description Active Date MDM Diagnosis L98.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 03/14/2023 No Yes R19.7 Diarrhea, unspecified 03/14/2023 No Yes E46 Unspecified protein-calorie malnutrition 03/14/2023 No Yes K70.9 Alcoholic liver disease, unspecified 03/14/2023 No Yes Inactive Problems Resolved Problems Electronic Signature(s) Signed: 03/23/2023 9:31:03 AM By: Duanne Guess MD FACS Entered By: Duanne Guess on 03/23/2023 06:31:02 -------------------------------------------------------------------------------- Progress Note Details Patient Name: Date of Service: Alyssa Carey 03/23/2023 8:45 A M Medical Record Number: [id number] Patient Account Number: [id number] Date of Birth/Sex: Treating RN: June 09, 1971 (51 y.o. [name] Primary Care Provider: Donzetta Sprung Other Clinician: Referring Provider: Treating Provider/Extender: Marcelle Overlie in Treatment: 1 Subjective Chief Complaint Information obtained from Patient Patient seen for complaints of Non-Healing Wounds History of Present Illness (HPI) ADMISSION 03/14/2023 This is a 52 year old woman with a past medical history significant for necrotizing fasciitis for which she underwent extensive debridement in 2021. She ended up with a diverting loop colostomy which was subsequently  reversed. She apparently has had issues with diarrhea ever since colostomy reversal and has breakdown on her buttocks as result of the diarrhea. She is followed by gastroenterology for the diarrhea. She also has alcoholic cirrhosis with a number of related comorbidities. She has been hospitalized several times in the past couple of months due to GI bleeding, but none of this seems to be variceal, based on the electronic medical record. 03/23/2023: The wounds are measuring about the same size, but she says that they are less painful. The wound edges are little bit scrolled over and the surface tissue has thin slough and remains a bit pale. Her GI doctor increased her cholestyramine and her diarrhea is improving. Patient History Information obtained from Patient, Chart. Family History Cancer - Mother, Hypertension - Mother, No family history of Diabetes, Heart Disease, Hereditary Spherocytosis, Kidney Disease, Lung Disease, Seizures, Stroke, Thyroid Problems, Tuberculosis. Social History Never smoker, Marital Status - Single, Alcohol Use - Never - quit 2 years ago, Drug Use - No History, Caffeine Use - Never. [place], Darner Madicyn Carey ([id number]) [id number] Page 6 of 9 Medical History Cardiovascular Patient has history of Arrhythmia - SVT afib, Hypertension , Gastrointestinal Patient has history of Cirrhosis Endocrine Denies history of Type I Diabetes, Type II Diabetes Genitourinary Denies history of End Stage Renal Disease Integumentary (Skin) Denies history of History of Burn Psychiatric  Patient has history of Anorexia/bulimia - poor appetite Denies history of Confinement Anxiety Hospitalization/Surgery History - EGD 01/17/2023. - colostomy takedown 01/26/2022. - colonoscopy. - flex sigmoidoscopy 12/2019. - IandD perirectal abscess 12/25/2019. - laproscopic loop colostomy 12/25/2019. - tonsilectomy. Medical A Surgical History Notes nd Constitutional Symptoms (General  Health) hx alcohol abuse, obesity Respiratory paralysis of right vocal cord Gastrointestinal SBO, GI bleed, PUD, gastritis and gastroduodenitis, hepatitis Integumentary (Skin) necrotizing fasciitis of pelvic region Objective Constitutional Tachycardic, asymptomatic. no acute distress. Vitals Time Taken: 9:04 AM, Height: 61 in, Weight: 178 lbs, BMI: 33.6, Temperature: 98.5 F, Pulse: 116 bpm, Respiratory Rate: 18 breaths/min, Blood Pressure: 107/74 mmHg. Respiratory Normal work of breathing on room air. General Notes: 03/23/2023: The wounds are measuring about the same size, but she says that they are less painful. The wound edges are little bit scrolled over and the surface tissue has thin slough and remains a bit pale. Integumentary (Hair, Skin) Wound #1 status is Open. Original cause of wound was Gradually Appeared. The date acquired was: 02/13/2023. The wound has been in treatment 1 weeks. The wound is located on the Gluteal fold. The wound measures 1.5cm length x 0.6cm width x 0.3cm depth; 0.707cm^2 area and 0.212cm^3 volume. There is Fat Layer (Subcutaneous Tissue) exposed. There is no tunneling noted, however, there is undermining starting at 6:00 and ending at 12:00 with a maximum distance of 0.7cm. There is a medium amount of serosanguineous drainage noted. The wound margin is epibole. There is medium (34-66%) pink granulation within the wound bed. There is a medium (34-66%) amount of necrotic tissue within the wound bed including Adherent Slough. The periwound skin appearance had no abnormalities noted for moisture. The periwound skin appearance had no abnormalities noted for color. The periwound skin appearance exhibited: Scarring. Periwound temperature was noted as No Abnormality. The periwound has tenderness on palpation. Wound #2 status is Open. Original cause of wound was Gradually Appeared. The date acquired was: 02/13/2023. The wound has been in treatment 1 weeks. The wound is  located on the Proximal Gluteal fold. The wound measures 1cm length x 0.5cm width x 0.1cm depth; 0.393cm^2 area and 0.039cm^3 volume. There is Fat Layer (Subcutaneous Tissue) exposed. There is a small amount of serosanguineous drainage noted. The wound margin is epibole. There is large (67-100%) red granulation within the wound bed. There is a small (1-33%) amount of necrotic tissue within the wound bed including Adherent Slough. The periwound skin appearance had no abnormalities noted for moisture. The periwound skin appearance had no abnormalities noted for color. The periwound skin appearance exhibited: Scarring. Periwound temperature was noted as No Abnormality. The periwound has tenderness on palpation. Assessment Active Problems ICD-10 Non-pressure chronic ulcer of buttock with fat layer exposed Diarrhea, unspecified Unspecified protein-calorie malnutrition Alcoholic liver disease, unspecified Eckenrode, Krisandra Carey ([id number]) [id number] Page 7 of 9 Procedures Wound #1 Pre-procedure diagnosis of Wound #1 is an Abrasion located on the Gluteal fold . There was a Selective/Open Wound Skin/Epidermis Debridement with a total area of 0.71 sq cm performed by Duanne Guess, MD. With the following instrument(s): Curette to remove Non-Viable tissue/material. Material removed includes [facility] and Skin: Epidermis and after achieving pain control using Lidocaine 4% T opical Solution. No specimens were taken. A time out was conducted at 09:30, prior to the start of the procedure. A Minimum amount of bleeding was controlled with Pressure. The procedure was tolerated well with a pain level of 4 throughout and a pain level of 2 following the procedure. Post Debridement  Measurements: 1.5cm length x 0.6cm width x 0.1cm depth; 0.071cm^3 volume. Character of Wound/Ulcer Post Debridement is stable. Post procedure Diagnosis Wound #1: Same as Pre-Procedure General Notes: scribed for Dr.  Lady Gary by Zenaida Deed, RN. Wound #2 Pre-procedure diagnosis of Wound #2 is an Abrasion located on the Proximal Gluteal fold . There was a Selective/Open Wound Skin/Epidermis Debridement with a total area of 0.59 sq cm performed by Duanne Guess, MD. With the following instrument(s): Curette to remove Non-Viable tissue/material. Material removed includes [facility] and Skin: Epidermis and after achieving pain control using Lidocaine 4% T opical Solution. No specimens were taken. A time out was conducted at 09:30, prior to the start of the procedure. A Minimum amount of bleeding was controlled with Pressure. The procedure was tolerated well with a pain level of 4 throughout and a pain level of 2 following the procedure. Post Debridement Measurements: 1.5cm length x 0.5cm width x 0.1cm depth; 0.059cm^3 volume. Character of Wound/Ulcer Post Debridement is stable. Post procedure Diagnosis Wound #2: Same as Pre-Procedure General Notes: scribed for Dr. Lady Gary by Zenaida Deed, RN. Plan Follow-up Appointments: Return Appointment in 1 week. - Dr. Lady Gary RM 1 Friday 9/13 @ 0845 am Anesthetic: Wound #1 Gluteal fold: (In clinic) Topical Lidocaine 4% applied to wound bed Wound #2 Proximal Gluteal fold: (In clinic) Topical Lidocaine 4% applied to wound bed Bathing/ Shower/ Hygiene: May shower and wash wound with soap and water. Other Bathing/Shower/Hygiene Orders/Instructions: - use sitz bath to clean rectal area after bowel movements Off-Loading: Turn and reposition every 2 hours - try to sit on sides of buttocks Additional Orders / Instructions: Follow Nutritious Diet - add protein to diet such as meats, chicken, eggs Juven Shake 1-2 times daily. - juven 2 times per day for 2 weeks WOUND #1: - Gluteal fold Wound Laterality: Prim Dressing: Maxorb Extra Ag+ Alginate Dressing, 2x2 (in/in) 1 x Per Day/30 Days ary Discharge Instructions: Apply to wound bed as instructed Secondary Dressing: Woven  Gauze Sponge, Non-Sterile 4x4 in 1 x Per Day/30 Days Discharge Instructions: Apply over primary dressing , roll and tape in place Secured With: 35M Medipore H Soft Cloth Surgical T ape, 4 x 10 (in/yd) 1 x Per Day/30 Days Discharge Instructions: Secure with tape as directed. WOUND #2: - Gluteal fold Wound Laterality: Proximal Prim Dressing: Maxorb Extra Ag+ Alginate Dressing, 2x2 (in/in) 1 x Per Day/30 Days ary Discharge Instructions: Apply to wound bed as instructed Secondary Dressing: Woven Gauze Sponge, Non-Sterile 4x4 in 1 x Per Day/30 Days Discharge Instructions: Apply over primary dressing , roll and tape in place Secured With: 35M Medipore H Soft Cloth Surgical T ape, 4 x 10 (in/yd) 1 x Per Day/30 Days Discharge Instructions: Secure with tape as directed. 03/23/2023: The wounds are measuring about the same size, but she says that they are less painful. The wound edges are little bit scrolled over and the surface tissue has thin slough and remains a bit pale. I used a curette to debride the skin edges and slough from the wound. We will continue silver alginate. Continue offloading efforts and increase protein intake. Follow-up in 1 week. Electronic Signature(s) Signed: 03/23/2023 10:15:25 AM By: Duanne Guess MD FACS Entered By: Duanne Guess on 03/23/2023 07:15:24 -------------------------------------------------------------------------------- HxROS Details Patient Name: Date of Service: ELIZEABETH, SINIBALDI 03/23/2023 8:45 A Lana Fish, Akeema Carey ([id number]) [id number] Page 8 of 9 Medical Record Number: [id number] Patient Account Number: [id number] Date of Birth/Sex: Treating RN: 12/17/1970 (52 y.o. [place], [facility]  Provider: Donzetta Sprung Other Clinician: Referring Provider: Treating Provider/Extender: Marcelle Overlie in Treatment: 1 Information Obtained From Patient Chart Constitutional Symptoms (General  Health) Medical History: Past Medical History Notes: hx alcohol abuse, obesity Respiratory Medical History: Past Medical History Notes: paralysis of right vocal cord Cardiovascular Medical History: Positive for: Arrhythmia - SVT afib; Hypertension , Gastrointestinal Medical History: Positive for: Cirrhosis Past Medical History Notes: SBO, GI bleed, PUD, gastritis and gastroduodenitis, hepatitis Endocrine Medical History: Negative for: Type I Diabetes; Type II Diabetes Genitourinary Medical History: Negative for: End Stage Renal Disease Integumentary (Skin) Medical History: Negative for: History of Burn Past Medical History Notes: necrotizing fasciitis of pelvic region Psychiatric Medical History: Positive for: Anorexia/bulimia - poor appetite Negative for: Confinement Anxiety Immunizations Pneumococcal Vaccine: Received Pneumococcal Vaccination: Yes Received Pneumococcal Vaccination On or After 60th Birthday: No Implantable Devices No devices added Hospitalization / Surgery History Type of Hospitalization/Surgery EGD 01/17/2023 colostomy takedown 01/26/2022 colonoscopy flex sigmoidoscopy 12/2019 IandD perirectal abscess 12/25/2019 laproscopic loop colostomy 12/25/2019 tonsilectomy Family and Social History Cancer: Yes - Mother; Diabetes: No; Heart Disease: No; Hereditary Spherocytosis: No; Hypertension: Yes - Mother; Kidney Disease: No; Lung Disease: No; Seizures: No; Stroke: No; Thyroid Problems: No; Tuberculosis: No; Never smoker; Marital Status - Single; Alcohol Use: Never - quit 2 years ago; Drug Use: NIOBE, FRIIS ([id number]) [id number] Page 9 of 9 No History; Caffeine Use: Never; Financial Concerns: No; Food, Clothing or Shelter Needs: No; Support System Lacking: No; Transportation Concerns: No Electronic Signature(s) Signed: 03/23/2023 10:59:01 AM By: Duanne Guess MD FACS Signed: 03/23/2023 1:19:41 PM By: Zenaida Deed RN,  BSN Entered By: Duanne Guess on 03/23/2023 07:09:04 -------------------------------------------------------------------------------- SuperBill Details Patient Name: Date of Service: Alyssa Carey 03/23/2023 Medical Record Number: [id number] Patient Account Number: [id number] Date of Birth/Sex: Treating RN: 02-09-1971 (52 y.o. [name] Primary Care Provider: Donzetta Sprung Other Clinician: Referring Provider: Treating Provider/Extender: Marcelle Overlie in Treatment: 1 Diagnosis Coding ICD-10 Codes Code Description [phone] Non-pressure chronic ulcer of buttock with fat layer exposed R19.7 Diarrhea, unspecified E46 Unspecified protein-calorie malnutrition K70.9 Alcoholic liver disease, unspecified Facility Procedures : CPT4 Code: [id number] Description: 97597 - DEBRIDE WOUND 1ST 20 SQ CM OR < ICD-10 Diagnosis Description L98.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 Modifier: Quantity: 1 Physician Procedures : CPT4 Code Description Modifier [id number] - WC PHYS LEVEL 3 - EST PT 25 ICD-10 Diagnosis Description L98.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 R19.7 Diarrhea, unspecified E46 Unspecified protein-calorie malnutrition K70.9  Alcoholic liver disease, unspecified Quantity: 1 : [id number] - WC PHYS DEBR WO ANESTH 20 SQ CM ICD-10 Diagnosis Description L98.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 Quantity: 1 Electronic Signature(s) Signed: 03/23/2023 10:15:44 AM By: Duanne Guess MD FACS Entered By: Duanne Guess on 03/23/2023 07:15:44

## 2023-03-30 ENCOUNTER — Encounter (HOSPITAL_BASED_OUTPATIENT_CLINIC_OR_DEPARTMENT_OTHER): Payer: Medicaid Other | Admitting: General Surgery

## 2023-03-30 DIAGNOSIS — L98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: Secondary | ICD-10-CM | POA: Diagnosis not present

## 2023-03-30 NOTE — Progress Notes (Signed)
Account Number: [id number] Date of Birth/Sex: Treating RN: [birth year] (52 y.o. F) Primary Care Alyssa Carey: Donzetta Sprung Other Clinician: Referring Alyssa Carey: Treating Alyssa Carey/Extender: Marcelle Overlie in Treatment: 2 Wound Status Wound Number: 2 Primary Etiology: Abrasion Wound Location: Proximal Gluteal fold Wound Status: Open Wounding Event: Gradually Appeared Comorbid History: Arrhythmia, Hypertension, Cirrhosis , Anorexia/bulimia Date Acquired: 02/13/2023 Weeks Of Treatment: 2 Clustered Wound: No Photos Wound Measurements Length: (cm) 1 Width: (cm) 0.5 Depth: (cm) 0.1 Area: (cm) 0.393 Volume: (cm) 0.039 % Reduction in Area: -38.9% % Reduction in Volume: -39.3% Epithelialization: Small (1-33%) Tunneling: No Undermining: No Wound Description Classification: Full Thickness Without Exposed Suppor Wound Margin: Thickened Exudate Amount: Medium Exudate Type: Serosanguineous Exudate Color: red, brown t Structures Foul Odor After Cleansing: No Slough/Fibrino Yes Wound Bed Granulation Amount: Large (67-100%) Exposed Structure Granulation Quality: Red Fascia Exposed: No Necrotic Amount: Small (1-33%) Fat Layer (Subcutaneous Tissue) Exposed: Yes Necrotic Quality: Adherent Slough Tendon Exposed: No Muscle Exposed: No Joint Exposed: No Bone Exposed: No Periwound Skin Texture Carey, Alyssa Carey ([id number]) [id number] Page 8 of 8 Texture Color No Abnormalities Noted: No No  Abnormalities Noted: Yes Scarring: Yes Temperature / Pain Temperature: No Abnormality Moisture No Abnormalities Noted: No Tenderness on Palpation: Yes Dry / Scaly: No Maceration: Yes Treatment Notes Wound #2 (Gluteal fold) Wound Laterality: Proximal Cleanser Peri-Wound Care Zinc Oxide Ointment 30g tube Discharge Instruction: Apply Zinc Oxide to periwound with each dressing change Topical Primary Dressing Maxorb Extra Ag+ Alginate Dressing, 2x2 (in/in) Discharge Instruction: Apply to wound bed as instructed Secondary Dressing Woven Gauze Sponge, Non-Sterile 4x4 in Discharge Instruction: Apply over primary dressing , roll and tape in place Secured With 36M Medipore H Soft Cloth Surgical T ape, 4 x 10 (in/yd) Discharge Instruction: Secure with tape as directed. Compression Wrap Compression Stockings Add-Ons Electronic Signature(s) Signed: 03/30/2023 11:43:52 AM By: Zenaida Deed RN, BSN Entered By: Zenaida Deed on 03/30/2023 09:25:22 -------------------------------------------------------------------------------- Vitals Details Patient Name: Date of Service: Alyssa Carey 03/30/2023 8:45 A M Medical Record Number: [id number] Patient Account Number: [id number] Date of Birth/Sex: Treating RN: February 09, 1971 (52 y.o. F) Primary Care Deeandra Jerry: Donzetta Sprung Other Clinician: Referring Alyssa Carey: Treating Alyssa Carey/Extender: Marcelle Overlie in Treatment: 2 Vital Signs Time Taken: 08:45 Temperature (F): 98.1 Height (in): 61 Pulse (bpm): 75 Weight (lbs): 178 Respiratory Rate (breaths/min): 18 Body Mass Index (BMI): 33.6 Blood Pressure (mmHg): 129/87 Reference Range: 80 - 120 mg / dl Electronic Signature(s) Signed: 03/30/2023 11:43:52 AM By: Zenaida Deed RN, BSN Entered By: Zenaida Deed on 03/30/2023 09:14:24  Alyssa, Alyssa Carey ([id number]) [id number] Page 1 of 8 Visit Report for 03/30/2023 Arrival Information Details Patient Name: Date of Service: Alyssa, Carey 03/30/2023 8:45 A M Medical Record Number: [id number] Patient Account Number: [id number] Date of Birth/Sex: Treating RN: February 07, 1971 (52 y.o. F) Primary Care Malahki Gasaway: Donzetta Sprung Other Clinician: Referring Alyssa Carey: Treating Yoav Okane/Extender: Marcelle Overlie in Treatment: 2 Visit Information History Since Last Visit All ordered tests and consults were completed: No Patient Arrived: Ambulatory Added or deleted any medications: No Arrival Time: 08:45 Any new allergies or adverse reactions: No Accompanied By: self Had a fall or experienced change in No Transfer Assistance: None activities of daily living that may affect Patient Identification Verified: Yes risk of falls: Secondary Verification Process Completed: Yes Signs or symptoms of abuse/neglect since last visito No Patient Requires Transmission-Based Precautions: No Hospitalized since last visit: No Patient Has Alerts: No Implantable device outside of the clinic excluding No cellular tissue based products placed in the center since last visit: Has Dressing in Place as Prescribed: Yes Pain Present Now: Yes Electronic Signature(s) Signed: 03/30/2023 11:43:52 AM By: Zenaida Deed RN, BSN Entered By: Zenaida Deed on 03/30/2023 09:14:03 -------------------------------------------------------------------------------- Encounter Discharge Information Details Patient Name: Date of Service: Alyssa Carey. 03/30/2023 8:45 A M Medical Record Number: [id number] Patient Account Number: [id number] Date of Birth/Sex: Treating RN: [birth year] (52 y.o. [name] Primary Care Tabbatha Bordelon: Donzetta Sprung Other Clinician: Referring Takina Busser: Treating Gonsalo Cuthbertson/Extender: Marcelle Overlie in Treatment:  2 Encounter Discharge Information Items Post Procedure Vitals Discharge Condition: Stable Temperature (F): 98.1 Ambulatory Status: Ambulatory Pulse (bpm): 75 Discharge Destination: Home Respiratory Rate (breaths/min): 18 Transportation: Private Auto Blood Pressure (mmHg): 129/87 Accompanied By: self Schedule Follow-up Appointment: Yes Clinical Summary of Care: Patient Declined Electronic Signature(s) Signed: 03/30/2023 11:43:52 AM By: Zenaida Deed RN, BSN Entered By: Zenaida Deed on 03/30/2023 09:43:45 Carey, Alyssa Carey ([id number]) [id number] Page 2 of 8 -------------------------------------------------------------------------------- Lower Extremity Assessment Details Patient Name: Date of Service: Alyssa, Carey 03/30/2023 8:45 A M Medical Record Number: [id number] Patient Account Number: [id number] Date of Birth/Sex: Treating RN: 1970-08-28 (52 y.o. [name] Primary Care Makila Colombe: Donzetta Sprung Other Clinician: Referring Michaelangelo Mittelman: Treating Edmond Ginsberg/Extender: Marcelle Overlie in Treatment: 2 Electronic Signature(s) Signed: 03/30/2023 11:43:52 AM By: Zenaida Deed RN, BSN Entered By: Zenaida Deed on 03/30/2023 09:15:37 -------------------------------------------------------------------------------- Multi Wound Chart Details Patient Name: Date of Service: Alyssa Carey 03/30/2023 8:45 A M Medical Record Number: [id number] Patient Account Number: [id number] Date of Birth/Sex: Treating RN: 11/07/1970 (52 y.o. F) Primary Care Betul Brisky: Donzetta Sprung Other Clinician: Referring Delany Steury: Treating Kyriana Yankee/Extender: Marcelle Overlie in Treatment: 2 Vital Signs Height(in): 61 Pulse(bpm): 75 Weight(lbs): 178 Blood Pressure(mmHg): 129/87 Body Mass Index(BMI): 33.6 Temperature(F): 98.1 Respiratory Rate(breaths/min): 18 [1:Photos:] [N/A:N/A] Gluteal fold Proximal Gluteal fold N/A Wound  Location: Gradually Appeared Gradually Appeared N/A Wounding Event: Abrasion Abrasion N/A Primary Etiology: Arrhythmia, Hypertension, Cirrhosis , Arrhythmia, Hypertension, Cirrhosis , N/A Comorbid History: Anorexia/bulimia Anorexia/bulimia 02/13/2023 02/13/2023 N/A Date Acquired: 2 2 N/A Weeks of Treatment: Open Open N/A Wound Status: No No N/A Wound Recurrence: 2x0.9x0.3 1x0.5x0.1 N/A Measurements Carey x W x D (cm) 1.414 0.393 N/A A (cm) : rea 0.424 0.039 N/A Volume (cm) : -176.70% -38.90% N/A % Reduction in A rea: -177.10% -39.30% N/A % Reduction in Volume: 12 Starting Position 1 (o'clock): 6 Ending Position 1 (o'clock): 0.7 Maximum Distance 1 (cm): Yes No N/A Undermining: Full Thickness Without Exposed Full Thickness  Alyssa, Alyssa Carey ([id number]) [id number] Page 1 of 8 Visit Report for 03/30/2023 Arrival Information Details Patient Name: Date of Service: Alyssa, Carey 03/30/2023 8:45 A M Medical Record Number: [id number] Patient Account Number: [id number] Date of Birth/Sex: Treating RN: February 07, 1971 (52 y.o. F) Primary Care Malahki Gasaway: Donzetta Sprung Other Clinician: Referring Alyssa Carey: Treating Yoav Okane/Extender: Marcelle Overlie in Treatment: 2 Visit Information History Since Last Visit All ordered tests and consults were completed: No Patient Arrived: Ambulatory Added or deleted any medications: No Arrival Time: 08:45 Any new allergies or adverse reactions: No Accompanied By: self Had a fall or experienced change in No Transfer Assistance: None activities of daily living that may affect Patient Identification Verified: Yes risk of falls: Secondary Verification Process Completed: Yes Signs or symptoms of abuse/neglect since last visito No Patient Requires Transmission-Based Precautions: No Hospitalized since last visit: No Patient Has Alerts: No Implantable device outside of the clinic excluding No cellular tissue based products placed in the center since last visit: Has Dressing in Place as Prescribed: Yes Pain Present Now: Yes Electronic Signature(s) Signed: 03/30/2023 11:43:52 AM By: Zenaida Deed RN, BSN Entered By: Zenaida Deed on 03/30/2023 09:14:03 -------------------------------------------------------------------------------- Encounter Discharge Information Details Patient Name: Date of Service: Alyssa Carey. 03/30/2023 8:45 A M Medical Record Number: [id number] Patient Account Number: [id number] Date of Birth/Sex: Treating RN: [birth year] (52 y.o. [name] Primary Care Tabbatha Bordelon: Donzetta Sprung Other Clinician: Referring Takina Busser: Treating Gonsalo Cuthbertson/Extender: Marcelle Overlie in Treatment:  2 Encounter Discharge Information Items Post Procedure Vitals Discharge Condition: Stable Temperature (F): 98.1 Ambulatory Status: Ambulatory Pulse (bpm): 75 Discharge Destination: Home Respiratory Rate (breaths/min): 18 Transportation: Private Auto Blood Pressure (mmHg): 129/87 Accompanied By: self Schedule Follow-up Appointment: Yes Clinical Summary of Care: Patient Declined Electronic Signature(s) Signed: 03/30/2023 11:43:52 AM By: Zenaida Deed RN, BSN Entered By: Zenaida Deed on 03/30/2023 09:43:45 Carey, Alyssa Carey ([id number]) [id number] Page 2 of 8 -------------------------------------------------------------------------------- Lower Extremity Assessment Details Patient Name: Date of Service: Alyssa, Carey 03/30/2023 8:45 A M Medical Record Number: [id number] Patient Account Number: [id number] Date of Birth/Sex: Treating RN: 1970-08-28 (52 y.o. [name] Primary Care Makila Colombe: Donzetta Sprung Other Clinician: Referring Michaelangelo Mittelman: Treating Edmond Ginsberg/Extender: Marcelle Overlie in Treatment: 2 Electronic Signature(s) Signed: 03/30/2023 11:43:52 AM By: Zenaida Deed RN, BSN Entered By: Zenaida Deed on 03/30/2023 09:15:37 -------------------------------------------------------------------------------- Multi Wound Chart Details Patient Name: Date of Service: Alyssa Carey 03/30/2023 8:45 A M Medical Record Number: [id number] Patient Account Number: [id number] Date of Birth/Sex: Treating RN: 11/07/1970 (52 y.o. F) Primary Care Betul Brisky: Donzetta Sprung Other Clinician: Referring Delany Steury: Treating Kyriana Yankee/Extender: Marcelle Overlie in Treatment: 2 Vital Signs Height(in): 61 Pulse(bpm): 75 Weight(lbs): 178 Blood Pressure(mmHg): 129/87 Body Mass Index(BMI): 33.6 Temperature(F): 98.1 Respiratory Rate(breaths/min): 18 [1:Photos:] [N/A:N/A] Gluteal fold Proximal Gluteal fold N/A Wound  Location: Gradually Appeared Gradually Appeared N/A Wounding Event: Abrasion Abrasion N/A Primary Etiology: Arrhythmia, Hypertension, Cirrhosis , Arrhythmia, Hypertension, Cirrhosis , N/A Comorbid History: Anorexia/bulimia Anorexia/bulimia 02/13/2023 02/13/2023 N/A Date Acquired: 2 2 N/A Weeks of Treatment: Open Open N/A Wound Status: No No N/A Wound Recurrence: 2x0.9x0.3 1x0.5x0.1 N/A Measurements Carey x W x D (cm) 1.414 0.393 N/A A (cm) : rea 0.424 0.039 N/A Volume (cm) : -176.70% -38.90% N/A % Reduction in A rea: -177.10% -39.30% N/A % Reduction in Volume: 12 Starting Position 1 (o'clock): 6 Ending Position 1 (o'clock): 0.7 Maximum Distance 1 (cm): Yes No N/A Undermining: Full Thickness Without Exposed Full Thickness  Account Number: [id number] Date of Birth/Sex: Treating RN: [birth year] (52 y.o. F) Primary Care Alyssa Carey: Donzetta Sprung Other Clinician: Referring Alyssa Carey: Treating Alyssa Carey/Extender: Marcelle Overlie in Treatment: 2 Wound Status Wound Number: 2 Primary Etiology: Abrasion Wound Location: Proximal Gluteal fold Wound Status: Open Wounding Event: Gradually Appeared Comorbid History: Arrhythmia, Hypertension, Cirrhosis , Anorexia/bulimia Date Acquired: 02/13/2023 Weeks Of Treatment: 2 Clustered Wound: No Photos Wound Measurements Length: (cm) 1 Width: (cm) 0.5 Depth: (cm) 0.1 Area: (cm) 0.393 Volume: (cm) 0.039 % Reduction in Area: -38.9% % Reduction in Volume: -39.3% Epithelialization: Small (1-33%) Tunneling: No Undermining: No Wound Description Classification: Full Thickness Without Exposed Suppor Wound Margin: Thickened Exudate Amount: Medium Exudate Type: Serosanguineous Exudate Color: red, brown t Structures Foul Odor After Cleansing: No Slough/Fibrino Yes Wound Bed Granulation Amount: Large (67-100%) Exposed Structure Granulation Quality: Red Fascia Exposed: No Necrotic Amount: Small (1-33%) Fat Layer (Subcutaneous Tissue) Exposed: Yes Necrotic Quality: Adherent Slough Tendon Exposed: No Muscle Exposed: No Joint Exposed: No Bone Exposed: No Periwound Skin Texture Carey, Alyssa Carey ([id number]) [id number] Page 8 of 8 Texture Color No Abnormalities Noted: No No  Abnormalities Noted: Yes Scarring: Yes Temperature / Pain Temperature: No Abnormality Moisture No Abnormalities Noted: No Tenderness on Palpation: Yes Dry / Scaly: No Maceration: Yes Treatment Notes Wound #2 (Gluteal fold) Wound Laterality: Proximal Cleanser Peri-Wound Care Zinc Oxide Ointment 30g tube Discharge Instruction: Apply Zinc Oxide to periwound with each dressing change Topical Primary Dressing Maxorb Extra Ag+ Alginate Dressing, 2x2 (in/in) Discharge Instruction: Apply to wound bed as instructed Secondary Dressing Woven Gauze Sponge, Non-Sterile 4x4 in Discharge Instruction: Apply over primary dressing , roll and tape in place Secured With 36M Medipore H Soft Cloth Surgical T ape, 4 x 10 (in/yd) Discharge Instruction: Secure with tape as directed. Compression Wrap Compression Stockings Add-Ons Electronic Signature(s) Signed: 03/30/2023 11:43:52 AM By: Zenaida Deed RN, BSN Entered By: Zenaida Deed on 03/30/2023 09:25:22 -------------------------------------------------------------------------------- Vitals Details Patient Name: Date of Service: Alyssa Carey 03/30/2023 8:45 A M Medical Record Number: [id number] Patient Account Number: [id number] Date of Birth/Sex: Treating RN: February 09, 1971 (52 y.o. F) Primary Care Deeandra Jerry: Donzetta Sprung Other Clinician: Referring Alyssa Carey: Treating Alyssa Carey/Extender: Marcelle Overlie in Treatment: 2 Vital Signs Time Taken: 08:45 Temperature (F): 98.1 Height (in): 61 Pulse (bpm): 75 Weight (lbs): 178 Respiratory Rate (breaths/min): 18 Body Mass Index (BMI): 33.6 Blood Pressure (mmHg): 129/87 Reference Range: 80 - 120 mg / dl Electronic Signature(s) Signed: 03/30/2023 11:43:52 AM By: Zenaida Deed RN, BSN Entered By: Zenaida Deed on 03/30/2023 09:14:24

## 2023-03-30 NOTE — Progress Notes (Signed)
Care Provider: Donzetta Carey Other Clinician: Referring Provider: Treating Provider/Extender: Alyssa Carey in Treatment: 2 History of Present Illness HPI Description: ADMISSION 03/14/2023 This is a 52 year old woman with a past medical history significant for necrotizing fasciitis for which she underwent extensive debridement in 2021. She ended up with a diverting loop colostomy which was subsequently reversed. She apparently has had issues with diarrhea ever since colostomy reversal and has breakdown on her buttocks as result of the diarrhea. She is followed by gastroenterology for the diarrhea. She also has alcoholic cirrhosis with a number of related comorbidities. She has been hospitalized several times in the past couple of months due to GI bleeding, but none of this seems to be variceal, based on the electronic medical record. 03/23/2023: The wounds are measuring about the same size, but she says that they are less painful. The wound edges are little bit scrolled over and the surface tissue has thin slough and remains a bit pale. Her GI doctor  increased her cholestyramine and her diarrhea is improving. 03/30/2023: The wounds are still stable in size, but her pain continues to improve. There is light slough on the surfaces along with some hypertrophic granulation tissue on the 2 caudal sites. The periwound skin is a little bit macerated. Her diarrhea is markedly better. Electronic Signature(s) Signed: 03/30/2023 9:35:54 AM By: Alyssa Guess MD FACS Entered By: Alyssa Carey on 03/30/2023 09:35:54 -------------------------------------------------------------------------------- Physical Exam Details Patient Name: Date of Service: Alyssa Carey 03/30/2023 8:45 A M Medical Record Number: [id number] Patient Account Number: [id number] Date of Birth/Sex: Treating RN: 1970-12-12 (52 y.o. F) Primary Care Provider: Donzetta Carey Other Clinician: Referring Provider: Treating Provider/Extender: Alyssa Carey in Treatment: 2 Constitutional . . . . no acute distress. Respiratory Normal work of breathing on room air. Notes 03/30/2023: The wounds are still stable in size, but her pain continues to improve. There is light slough on the surfaces along with some hypertrophic granulation tissue on the 2 caudal sites. The periwound skin is a little bit macerated. Electronic Signature(s) Signed: 03/30/2023 9:36:24 AM By: Alyssa Guess MD FACS Entered By: Alyssa Carey on 03/30/2023 09:36:23 -------------------------------------------------------------------------------- Physician Orders Details Patient Name: Date of Service: Alyssa Carey 03/30/2023 8:45 A M Medical Record Number: [id number] Patient Account Number: [id number] Date of Birth/Sex: Treating RN: 1970-08-08 (52 y.o. 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 - Dr. Lady Carey RM 1 Return A Friday 9/20 @ 0845 am Anesthetic Wound #1 Gluteal fold (In clinic) Topical Lidocaine 4% applied to wound bed Wound #2 Proximal Gluteal fold (In clinic) Topical Lidocaine 4% applied to wound bed Bathing/ Shower/ Hygiene May shower and wash wound with soap and water. Other Bathing/Shower/Hygiene Orders/Instructions: - use sitz bath to clean rectal area after bowel movements Off-Loading Turn and reposition every 2 hours - try to sit on sides of buttocks Additional Orders / Instructions Follow Nutritious Diet - add protein to diet such as meats, chicken, eggs Juven Shake 1-2 times daily. - juven 2 times per day for  2 weeks Wound Treatment Wound #1 - Gluteal fold Peri-Wound Care: Zinc Oxide Ointment 30g tube 1 x Per Day/30 Days Discharge Instructions: Apply Zinc Oxide to periwound with each dressing change Prim Dressing: Maxorb Extra Ag+ Alginate Dressing, 2x2 (in/in) 1 x Per Day/30 Days ary Discharge Instructions: Apply to wound bed as instructed Secondary Dressing: Woven Gauze Sponge, Non-Sterile 4x4 in 1 x Per Day/30 Days Discharge Instructions: Apply over primary dressing , roll and tape in place Secured With: 39M Medipore H Soft Cloth Surgical T ape, 4 x 10 (in/yd) 1 x Per Day/30 Days Discharge Instructions: Secure with tape as directed. Wound #2 - Gluteal fold Wound Laterality: Proximal Peri-Wound Care: Zinc Oxide Ointment 30g tube 1 x Per Day/30 Days Discharge Instructions: Apply Zinc Oxide  to periwound with each dressing change Prim Dressing: Maxorb Extra Ag+ Alginate Dressing, 2x2 (in/in) 1 x Per Day/30 Days ary Discharge Instructions: Apply to wound bed as instructed Secondary Dressing: Woven Gauze Sponge, Non-Sterile 4x4 in 1 x Per Day/30 Days Discharge Instructions: Apply over primary dressing , roll and tape in place Secured With: 39M Medipore H Soft Cloth Surgical T ape, 4 x 10 (in/yd) 1 x Per Day/30 Days Discharge Instructions: Secure with tape as directed. Electronic Signature(s) Signed: 03/30/2023 10:39:27 AM By: Alyssa Guess MD FACS Entered By: Alyssa Carey on 03/30/2023 09:36:50 Alyssa Carey ([id number]) [id number] Page 5 of 10 -------------------------------------------------------------------------------- Problem List Details Patient Name: Date of Service: Alyssa Carey, Alyssa Carey 03/30/2023 8:45 A M Medical Record Number: [id number] Patient Account Number: [id number] Date of Birth/Sex: Treating RN: 1970-10-31 (52 y.o. Alyssa Carey Primary Care Provider: Donzetta Carey Other Clinician: Referring Provider: Treating Provider/Extender: Alyssa Carey in Treatment: 2 Active Problems ICD-10 Encounter Code Description Active Date MDM Diagnosis [phone] Non-pressure chronic ulcer of buttock with fat layer exposed 03/14/2023 No Yes R19.7 Diarrhea, unspecified 03/14/2023 No Yes E46 Unspecified protein-calorie malnutrition 03/14/2023 No Yes K70.9 Alcoholic liver disease, unspecified 03/14/2023 No Yes Inactive Problems Resolved Problems Electronic Signature(s) Signed: 03/30/2023 9:34:11 AM By: Alyssa Guess MD FACS Entered By: Alyssa Carey on 03/30/2023 09:34:10 -------------------------------------------------------------------------------- Progress Note Details Patient Name: Date of Service: Alyssa Carey 03/30/2023 8:45 A M Medical Record Number: [id number] Patient Account Number:  [id number] Date of Birth/Sex: Treating RN: 12-30-1970 (52 y.o. F) Primary Care Provider: Donzetta Carey Other Clinician: Referring Provider: Treating Provider/Extender: Alyssa Carey in Treatment: 2 Subjective Chief Complaint Information obtained from Patient Patient seen for complaints of Non-Healing Wounds History of Present Illness (HPI) ADMISSION 03/14/2023 This is a 52 year old woman with a past medical history significant for necrotizing fasciitis for which she underwent extensive debridement in 2021. She ended up with a diverting loop colostomy which was subsequently reversed. She apparently has had issues with diarrhea ever since colostomy reversal and has breakdown on her buttocks as result of the diarrhea. She is followed by gastroenterology for the diarrhea. She also has alcoholic cirrhosis with a number of Alyssa Carey, Alyssa Carey ([id number]) [phone].pdf Page 6 of 10 related comorbidities. She has been hospitalized several times in the past couple of months due to GI bleeding, but none of this seems to be variceal, based on the electronic medical record. 03/23/2023: The wounds are measuring about the same size, but she says that they are less painful. The wound edges are little bit scrolled over and the surface tissue has thin slough and remains a bit pale. Her GI doctor increased her cholestyramine and her diarrhea is improving. 03/30/2023: The wounds  Care Provider: Donzetta Carey Other Clinician: Referring Provider: Treating Provider/Extender: Alyssa Carey in Treatment: 2 History of Present Illness HPI Description: ADMISSION 03/14/2023 This is a 52 year old woman with a past medical history significant for necrotizing fasciitis for which she underwent extensive debridement in 2021. She ended up with a diverting loop colostomy which was subsequently reversed. She apparently has had issues with diarrhea ever since colostomy reversal and has breakdown on her buttocks as result of the diarrhea. She is followed by gastroenterology for the diarrhea. She also has alcoholic cirrhosis with a number of related comorbidities. She has been hospitalized several times in the past couple of months due to GI bleeding, but none of this seems to be variceal, based on the electronic medical record. 03/23/2023: The wounds are measuring about the same size, but she says that they are less painful. The wound edges are little bit scrolled over and the surface tissue has thin slough and remains a bit pale. Her GI doctor  increased her cholestyramine and her diarrhea is improving. 03/30/2023: The wounds are still stable in size, but her pain continues to improve. There is light slough on the surfaces along with some hypertrophic granulation tissue on the 2 caudal sites. The periwound skin is a little bit macerated. Her diarrhea is markedly better. Electronic Signature(s) Signed: 03/30/2023 9:35:54 AM By: Alyssa Guess MD FACS Entered By: Alyssa Carey on 03/30/2023 09:35:54 -------------------------------------------------------------------------------- Physical Exam Details Patient Name: Date of Service: Alyssa Carey 03/30/2023 8:45 A M Medical Record Number: [id number] Patient Account Number: [id number] Date of Birth/Sex: Treating RN: 1970-12-12 (52 y.o. F) Primary Care Provider: Donzetta Carey Other Clinician: Referring Provider: Treating Provider/Extender: Alyssa Carey in Treatment: 2 Constitutional . . . . no acute distress. Respiratory Normal work of breathing on room air. Notes 03/30/2023: The wounds are still stable in size, but her pain continues to improve. There is light slough on the surfaces along with some hypertrophic granulation tissue on the 2 caudal sites. The periwound skin is a little bit macerated. Electronic Signature(s) Signed: 03/30/2023 9:36:24 AM By: Alyssa Guess MD FACS Entered By: Alyssa Carey on 03/30/2023 09:36:23 -------------------------------------------------------------------------------- Physician Orders Details Patient Name: Date of Service: Alyssa Carey 03/30/2023 8:45 A M Medical Record Number: [id number] Patient Account Number: [id number] Date of Birth/Sex: Treating RN: 1970-08-08 (52 y.o. 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 - Dr. Lady Carey RM 1 Return A Friday 9/20 @ 0845 am Anesthetic Wound #1 Gluteal fold (In clinic) Topical Lidocaine 4% applied to wound bed Wound #2 Proximal Gluteal fold (In clinic) Topical Lidocaine 4% applied to wound bed Bathing/ Shower/ Hygiene May shower and wash wound with soap and water. Other Bathing/Shower/Hygiene Orders/Instructions: - use sitz bath to clean rectal area after bowel movements Off-Loading Turn and reposition every 2 hours - try to sit on sides of buttocks Additional Orders / Instructions Follow Nutritious Diet - add protein to diet such as meats, chicken, eggs Juven Shake 1-2 times daily. - juven 2 times per day for  2 weeks Wound Treatment Wound #1 - Gluteal fold Peri-Wound Care: Zinc Oxide Ointment 30g tube 1 x Per Day/30 Days Discharge Instructions: Apply Zinc Oxide to periwound with each dressing change Prim Dressing: Maxorb Extra Ag+ Alginate Dressing, 2x2 (in/in) 1 x Per Day/30 Days ary Discharge Instructions: Apply to wound bed as instructed Secondary Dressing: Woven Gauze Sponge, Non-Sterile 4x4 in 1 x Per Day/30 Days Discharge Instructions: Apply over primary dressing , roll and tape in place Secured With: 39M Medipore H Soft Cloth Surgical T ape, 4 x 10 (in/yd) 1 x Per Day/30 Days Discharge Instructions: Secure with tape as directed. Wound #2 - Gluteal fold Wound Laterality: Proximal Peri-Wound Care: Zinc Oxide Ointment 30g tube 1 x Per Day/30 Days Discharge Instructions: Apply Zinc Oxide  to periwound with each dressing change Prim Dressing: Maxorb Extra Ag+ Alginate Dressing, 2x2 (in/in) 1 x Per Day/30 Days ary Discharge Instructions: Apply to wound bed as instructed Secondary Dressing: Woven Gauze Sponge, Non-Sterile 4x4 in 1 x Per Day/30 Days Discharge Instructions: Apply over primary dressing , roll and tape in place Secured With: 39M Medipore H Soft Cloth Surgical T ape, 4 x 10 (in/yd) 1 x Per Day/30 Days Discharge Instructions: Secure with tape as directed. Electronic Signature(s) Signed: 03/30/2023 10:39:27 AM By: Alyssa Guess MD FACS Entered By: Alyssa Carey on 03/30/2023 09:36:50 Alyssa Carey ([id number]) [id number] Page 5 of 10 -------------------------------------------------------------------------------- Problem List Details Patient Name: Date of Service: Alyssa Carey, Alyssa Carey 03/30/2023 8:45 A M Medical Record Number: [id number] Patient Account Number: [id number] Date of Birth/Sex: Treating RN: 1970-10-31 (52 y.o. Alyssa Carey Primary Care Provider: Donzetta Carey Other Clinician: Referring Provider: Treating Provider/Extender: Alyssa Carey in Treatment: 2 Active Problems ICD-10 Encounter Code Description Active Date MDM Diagnosis [phone] Non-pressure chronic ulcer of buttock with fat layer exposed 03/14/2023 No Yes R19.7 Diarrhea, unspecified 03/14/2023 No Yes E46 Unspecified protein-calorie malnutrition 03/14/2023 No Yes K70.9 Alcoholic liver disease, unspecified 03/14/2023 No Yes Inactive Problems Resolved Problems Electronic Signature(s) Signed: 03/30/2023 9:34:11 AM By: Alyssa Guess MD FACS Entered By: Alyssa Carey on 03/30/2023 09:34:10 -------------------------------------------------------------------------------- Progress Note Details Patient Name: Date of Service: Alyssa Carey 03/30/2023 8:45 A M Medical Record Number: [id number] Patient Account Number:  [id number] Date of Birth/Sex: Treating RN: 12-30-1970 (52 y.o. F) Primary Care Provider: Donzetta Carey Other Clinician: Referring Provider: Treating Provider/Extender: Alyssa Carey in Treatment: 2 Subjective Chief Complaint Information obtained from Patient Patient seen for complaints of Non-Healing Wounds History of Present Illness (HPI) ADMISSION 03/14/2023 This is a 52 year old woman with a past medical history significant for necrotizing fasciitis for which she underwent extensive debridement in 2021. She ended up with a diverting loop colostomy which was subsequently reversed. She apparently has had issues with diarrhea ever since colostomy reversal and has breakdown on her buttocks as result of the diarrhea. She is followed by gastroenterology for the diarrhea. She also has alcoholic cirrhosis with a number of Alyssa Carey, Alyssa Carey ([id number]) [phone].pdf Page 6 of 10 related comorbidities. She has been hospitalized several times in the past couple of months due to GI bleeding, but none of this seems to be variceal, based on the electronic medical record. 03/23/2023: The wounds are measuring about the same size, but she says that they are less painful. The wound edges are little bit scrolled over and the surface tissue has thin slough and remains a bit pale. Her GI doctor increased her cholestyramine and her diarrhea is improving. 03/30/2023: The wounds  2 weeks Wound Treatment Wound #1 - Gluteal fold Peri-Wound Care: Zinc Oxide Ointment 30g tube 1 x Per Day/30 Days Discharge Instructions: Apply Zinc Oxide to periwound with each dressing change Prim Dressing: Maxorb Extra Ag+ Alginate Dressing, 2x2 (in/in) 1 x Per Day/30 Days ary Discharge Instructions: Apply to wound bed as instructed Secondary Dressing: Woven Gauze Sponge, Non-Sterile 4x4 in 1 x Per Day/30 Days Discharge Instructions: Apply over primary dressing , roll and tape in place Secured With: 39M Medipore H Soft Cloth Surgical T ape, 4 x 10 (in/yd) 1 x Per Day/30 Days Discharge Instructions: Secure with tape as directed. Wound #2 - Gluteal fold Wound Laterality: Proximal Peri-Wound Care: Zinc Oxide Ointment 30g tube 1 x Per Day/30 Days Discharge Instructions: Apply Zinc Oxide  to periwound with each dressing change Prim Dressing: Maxorb Extra Ag+ Alginate Dressing, 2x2 (in/in) 1 x Per Day/30 Days ary Discharge Instructions: Apply to wound bed as instructed Secondary Dressing: Woven Gauze Sponge, Non-Sterile 4x4 in 1 x Per Day/30 Days Discharge Instructions: Apply over primary dressing , roll and tape in place Secured With: 39M Medipore H Soft Cloth Surgical T ape, 4 x 10 (in/yd) 1 x Per Day/30 Days Discharge Instructions: Secure with tape as directed. Electronic Signature(s) Signed: 03/30/2023 10:39:27 AM By: Alyssa Guess MD FACS Entered By: Alyssa Carey on 03/30/2023 09:36:50 Alyssa Carey ([id number]) [id number] Page 5 of 10 -------------------------------------------------------------------------------- Problem List Details Patient Name: Date of Service: Alyssa Carey, Alyssa Carey 03/30/2023 8:45 A M Medical Record Number: [id number] Patient Account Number: [id number] Date of Birth/Sex: Treating RN: 1970-10-31 (52 y.o. Alyssa Carey Primary Care Provider: Donzetta Carey Other Clinician: Referring Provider: Treating Provider/Extender: Alyssa Carey in Treatment: 2 Active Problems ICD-10 Encounter Code Description Active Date MDM Diagnosis [phone] Non-pressure chronic ulcer of buttock with fat layer exposed 03/14/2023 No Yes R19.7 Diarrhea, unspecified 03/14/2023 No Yes E46 Unspecified protein-calorie malnutrition 03/14/2023 No Yes K70.9 Alcoholic liver disease, unspecified 03/14/2023 No Yes Inactive Problems Resolved Problems Electronic Signature(s) Signed: 03/30/2023 9:34:11 AM By: Alyssa Guess MD FACS Entered By: Alyssa Carey on 03/30/2023 09:34:10 -------------------------------------------------------------------------------- Progress Note Details Patient Name: Date of Service: Alyssa Carey 03/30/2023 8:45 A M Medical Record Number: [id number] Patient Account Number:  [id number] Date of Birth/Sex: Treating RN: 12-30-1970 (52 y.o. F) Primary Care Provider: Donzetta Carey Other Clinician: Referring Provider: Treating Provider/Extender: Alyssa Carey in Treatment: 2 Subjective Chief Complaint Information obtained from Patient Patient seen for complaints of Non-Healing Wounds History of Present Illness (HPI) ADMISSION 03/14/2023 This is a 52 year old woman with a past medical history significant for necrotizing fasciitis for which she underwent extensive debridement in 2021. She ended up with a diverting loop colostomy which was subsequently reversed. She apparently has had issues with diarrhea ever since colostomy reversal and has breakdown on her buttocks as result of the diarrhea. She is followed by gastroenterology for the diarrhea. She also has alcoholic cirrhosis with a number of Alyssa Carey, Alyssa Carey ([id number]) [phone].pdf Page 6 of 10 related comorbidities. She has been hospitalized several times in the past couple of months due to GI bleeding, but none of this seems to be variceal, based on the electronic medical record. 03/23/2023: The wounds are measuring about the same size, but she says that they are less painful. The wound edges are little bit scrolled over and the surface tissue has thin slough and remains a bit pale. Her GI doctor increased her cholestyramine and her diarrhea is improving. 03/30/2023: The wounds  2 weeks Wound Treatment Wound #1 - Gluteal fold Peri-Wound Care: Zinc Oxide Ointment 30g tube 1 x Per Day/30 Days Discharge Instructions: Apply Zinc Oxide to periwound with each dressing change Prim Dressing: Maxorb Extra Ag+ Alginate Dressing, 2x2 (in/in) 1 x Per Day/30 Days ary Discharge Instructions: Apply to wound bed as instructed Secondary Dressing: Woven Gauze Sponge, Non-Sterile 4x4 in 1 x Per Day/30 Days Discharge Instructions: Apply over primary dressing , roll and tape in place Secured With: 39M Medipore H Soft Cloth Surgical T ape, 4 x 10 (in/yd) 1 x Per Day/30 Days Discharge Instructions: Secure with tape as directed. Wound #2 - Gluteal fold Wound Laterality: Proximal Peri-Wound Care: Zinc Oxide Ointment 30g tube 1 x Per Day/30 Days Discharge Instructions: Apply Zinc Oxide  to periwound with each dressing change Prim Dressing: Maxorb Extra Ag+ Alginate Dressing, 2x2 (in/in) 1 x Per Day/30 Days ary Discharge Instructions: Apply to wound bed as instructed Secondary Dressing: Woven Gauze Sponge, Non-Sterile 4x4 in 1 x Per Day/30 Days Discharge Instructions: Apply over primary dressing , roll and tape in place Secured With: 39M Medipore H Soft Cloth Surgical T ape, 4 x 10 (in/yd) 1 x Per Day/30 Days Discharge Instructions: Secure with tape as directed. Electronic Signature(s) Signed: 03/30/2023 10:39:27 AM By: Alyssa Guess MD FACS Entered By: Alyssa Carey on 03/30/2023 09:36:50 Alyssa Carey ([id number]) [id number] Page 5 of 10 -------------------------------------------------------------------------------- Problem List Details Patient Name: Date of Service: Alyssa Carey, Alyssa Carey 03/30/2023 8:45 A M Medical Record Number: [id number] Patient Account Number: [id number] Date of Birth/Sex: Treating RN: 1970-10-31 (52 y.o. Alyssa Carey Primary Care Provider: Donzetta Carey Other Clinician: Referring Provider: Treating Provider/Extender: Alyssa Carey in Treatment: 2 Active Problems ICD-10 Encounter Code Description Active Date MDM Diagnosis [phone] Non-pressure chronic ulcer of buttock with fat layer exposed 03/14/2023 No Yes R19.7 Diarrhea, unspecified 03/14/2023 No Yes E46 Unspecified protein-calorie malnutrition 03/14/2023 No Yes K70.9 Alcoholic liver disease, unspecified 03/14/2023 No Yes Inactive Problems Resolved Problems Electronic Signature(s) Signed: 03/30/2023 9:34:11 AM By: Alyssa Guess MD FACS Entered By: Alyssa Carey on 03/30/2023 09:34:10 -------------------------------------------------------------------------------- Progress Note Details Patient Name: Date of Service: Alyssa Carey 03/30/2023 8:45 A M Medical Record Number: [id number] Patient Account Number:  [id number] Date of Birth/Sex: Treating RN: 12-30-1970 (52 y.o. F) Primary Care Provider: Donzetta Carey Other Clinician: Referring Provider: Treating Provider/Extender: Alyssa Carey in Treatment: 2 Subjective Chief Complaint Information obtained from Patient Patient seen for complaints of Non-Healing Wounds History of Present Illness (HPI) ADMISSION 03/14/2023 This is a 52 year old woman with a past medical history significant for necrotizing fasciitis for which she underwent extensive debridement in 2021. She ended up with a diverting loop colostomy which was subsequently reversed. She apparently has had issues with diarrhea ever since colostomy reversal and has breakdown on her buttocks as result of the diarrhea. She is followed by gastroenterology for the diarrhea. She also has alcoholic cirrhosis with a number of Alyssa Carey, Alyssa Carey ([id number]) [phone].pdf Page 6 of 10 related comorbidities. She has been hospitalized several times in the past couple of months due to GI bleeding, but none of this seems to be variceal, based on the electronic medical record. 03/23/2023: The wounds are measuring about the same size, but she says that they are less painful. The wound edges are little bit scrolled over and the surface tissue has thin slough and remains a bit pale. Her GI doctor increased her cholestyramine and her diarrhea is improving. 03/30/2023: The wounds  Care Provider: Donzetta Carey Other Clinician: Referring Provider: Treating Provider/Extender: Alyssa Carey in Treatment: 2 History of Present Illness HPI Description: ADMISSION 03/14/2023 This is a 52 year old woman with a past medical history significant for necrotizing fasciitis for which she underwent extensive debridement in 2021. She ended up with a diverting loop colostomy which was subsequently reversed. She apparently has had issues with diarrhea ever since colostomy reversal and has breakdown on her buttocks as result of the diarrhea. She is followed by gastroenterology for the diarrhea. She also has alcoholic cirrhosis with a number of related comorbidities. She has been hospitalized several times in the past couple of months due to GI bleeding, but none of this seems to be variceal, based on the electronic medical record. 03/23/2023: The wounds are measuring about the same size, but she says that they are less painful. The wound edges are little bit scrolled over and the surface tissue has thin slough and remains a bit pale. Her GI doctor  increased her cholestyramine and her diarrhea is improving. 03/30/2023: The wounds are still stable in size, but her pain continues to improve. There is light slough on the surfaces along with some hypertrophic granulation tissue on the 2 caudal sites. The periwound skin is a little bit macerated. Her diarrhea is markedly better. Electronic Signature(s) Signed: 03/30/2023 9:35:54 AM By: Alyssa Guess MD FACS Entered By: Alyssa Carey on 03/30/2023 09:35:54 -------------------------------------------------------------------------------- Physical Exam Details Patient Name: Date of Service: Alyssa Carey 03/30/2023 8:45 A M Medical Record Number: [id number] Patient Account Number: [id number] Date of Birth/Sex: Treating RN: 1970-12-12 (52 y.o. F) Primary Care Provider: Donzetta Carey Other Clinician: Referring Provider: Treating Provider/Extender: Alyssa Carey in Treatment: 2 Constitutional . . . . no acute distress. Respiratory Normal work of breathing on room air. Notes 03/30/2023: The wounds are still stable in size, but her pain continues to improve. There is light slough on the surfaces along with some hypertrophic granulation tissue on the 2 caudal sites. The periwound skin is a little bit macerated. Electronic Signature(s) Signed: 03/30/2023 9:36:24 AM By: Alyssa Guess MD FACS Entered By: Alyssa Carey on 03/30/2023 09:36:23 -------------------------------------------------------------------------------- Physician Orders Details Patient Name: Date of Service: Alyssa Carey 03/30/2023 8:45 A M Medical Record Number: [id number] Patient Account Number: [id number] Date of Birth/Sex: Treating RN: 1970-08-08 (52 y.o. 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 - Dr. Lady Carey RM 1 Return A Friday 9/20 @ 0845 am Anesthetic Wound #1 Gluteal fold (In clinic) Topical Lidocaine 4% applied to wound bed Wound #2 Proximal Gluteal fold (In clinic) Topical Lidocaine 4% applied to wound bed Bathing/ Shower/ Hygiene May shower and wash wound with soap and water. Other Bathing/Shower/Hygiene Orders/Instructions: - use sitz bath to clean rectal area after bowel movements Off-Loading Turn and reposition every 2 hours - try to sit on sides of buttocks Additional Orders / Instructions Follow Nutritious Diet - add protein to diet such as meats, chicken, eggs Juven Shake 1-2 times daily. - juven 2 times per day for

## 2023-04-03 ENCOUNTER — Ambulatory Visit: Payer: Medicaid Other | Admitting: Gastroenterology

## 2023-04-04 ENCOUNTER — Other Ambulatory Visit: Payer: Self-pay

## 2023-04-04 ENCOUNTER — Observation Stay (HOSPITAL_COMMUNITY): Payer: Medicaid Other

## 2023-04-04 ENCOUNTER — Observation Stay (HOSPITAL_COMMUNITY)
Admission: EM | Admit: 2023-04-04 | Discharge: 2023-04-06 | Disposition: A | Discharge status: Home / Self Care | Payer: Medicaid Other | Attending: Internal Medicine | Admitting: Internal Medicine

## 2023-04-04 DIAGNOSIS — K921 Melena: Secondary | ICD-10-CM | POA: Insufficient documentation

## 2023-04-04 DIAGNOSIS — Z79899 Other long term (current) drug therapy: Secondary | ICD-10-CM | POA: Insufficient documentation

## 2023-04-04 DIAGNOSIS — K7030 Alcoholic cirrhosis of liver without ascites: Secondary | ICD-10-CM | POA: Diagnosis not present

## 2023-04-04 DIAGNOSIS — L98429 Non-pressure chronic ulcer of back with unspecified severity: Secondary | ICD-10-CM | POA: Diagnosis not present

## 2023-04-04 DIAGNOSIS — R7401 Elevation of levels of liver transaminase levels: Secondary | ICD-10-CM | POA: Diagnosis not present

## 2023-04-04 DIAGNOSIS — E669 Obesity, unspecified: Secondary | ICD-10-CM | POA: Diagnosis not present

## 2023-04-04 DIAGNOSIS — F1010 Alcohol abuse, uncomplicated: Secondary | ICD-10-CM | POA: Diagnosis not present

## 2023-04-04 DIAGNOSIS — E871 Hypo-osmolality and hyponatremia: Secondary | ICD-10-CM | POA: Diagnosis present

## 2023-04-04 DIAGNOSIS — R109 Unspecified abdominal pain: Secondary | ICD-10-CM | POA: Diagnosis present

## 2023-04-04 DIAGNOSIS — K922 Gastrointestinal hemorrhage, unspecified: Secondary | ICD-10-CM | POA: Diagnosis not present

## 2023-04-04 DIAGNOSIS — I471 Supraventricular tachycardia, unspecified: Secondary | ICD-10-CM | POA: Diagnosis not present

## 2023-04-04 LAB — CBC WITH DIFFERENTIAL/PLATELET
Abs Immature Granulocytes: 0.01 10*3/uL (ref 0.00–0.07)
Basophils Absolute: 0 10*3/uL (ref 0.0–0.1)
Basophils Relative: 1 %
Eosinophils Absolute: 0.1 10*3/uL (ref 0.0–0.5)
Eosinophils Relative: 2 %
HCT: 32.4 % — ABNORMAL LOW (ref 36.0–46.0)
Hemoglobin: 10.3 g/dL — ABNORMAL LOW (ref 12.0–15.0)
Immature Granulocytes: 0 %
Lymphocytes Relative: 22 %
Lymphs Abs: 1.2 10*3/uL (ref 0.7–4.0)
MCH: 26.9 pg (ref 26.0–34.0)
MCHC: 31.8 g/dL (ref 30.0–36.0)
MCV: 84.6 fL (ref 80.0–100.0)
Monocytes Absolute: 0.3 10*3/uL (ref 0.1–1.0)
Monocytes Relative: 5 %
Neutro Abs: 3.8 10*3/uL (ref 1.7–7.7)
Neutrophils Relative %: 70 %
Platelets: 192 10*3/uL (ref 150–400)
RBC: 3.83 MIL/uL — ABNORMAL LOW (ref 3.87–5.11)
RDW: 19.7 % — ABNORMAL HIGH (ref 11.5–15.5)
WBC: 5.4 10*3/uL (ref 4.0–10.5)
nRBC: 0 % (ref 0.0–0.2)

## 2023-04-04 LAB — COMPREHENSIVE METABOLIC PANEL WITH GFR
ALT: 99 U/L — ABNORMAL HIGH (ref 0–44)
AST: 380 U/L — ABNORMAL HIGH (ref 15–41)
Albumin: 3.2 g/dL — ABNORMAL LOW (ref 3.5–5.0)
Alkaline Phosphatase: 538 U/L — ABNORMAL HIGH (ref 38–126)
Anion gap: 17 — ABNORMAL HIGH (ref 5–15)
BUN: 7 mg/dL (ref 6–20)
CO2: 20 mmol/L — ABNORMAL LOW (ref 22–32)
Calcium: 8.3 mg/dL — ABNORMAL LOW (ref 8.9–10.3)
Chloride: 94 mmol/L — ABNORMAL LOW (ref 98–111)
Creatinine, Ser: 0.6 mg/dL (ref 0.44–1.00)
GFR, Estimated: >60 mL/min (ref >60)
Glucose, Bld: 71 mg/dL (ref 70–99)
Potassium: 4.1 mmol/L (ref 3.5–5.1)
Sodium: 131 mmol/L — ABNORMAL LOW (ref 135–145)
Total Bilirubin: 2 mg/dL — ABNORMAL HIGH (ref 0.3–1.2)
Total Protein: 7.7 g/dL (ref 6.5–8.1)

## 2023-04-04 LAB — URINALYSIS, W/ REFLEX TO CULTURE (INFECTION SUSPECTED)
Bilirubin Urine: NEGATIVE
Glucose, UA: 50 mg/dL — AB
Ketones, ur: 20 mg/dL — AB
Leukocytes,Ua: NEGATIVE
Nitrite: NEGATIVE
Protein, ur: NEGATIVE mg/dL
Specific Gravity, Urine: 1.014 (ref 1.005–1.030)
pH: 6 (ref 5.0–8.0)

## 2023-04-04 LAB — RAPID URINE DRUG SCREEN, HOSP PERFORMED
Amphetamines: NOT DETECTED
Barbiturates: NOT DETECTED
Benzodiazepines: NOT DETECTED
Cocaine: NOT DETECTED
Opiates: NOT DETECTED
Tetrahydrocannabinol: NOT DETECTED

## 2023-04-04 LAB — GLUCOSE, CAPILLARY
Glucose-Capillary: 126 mg/dL — ABNORMAL HIGH (ref 70–99)
Glucose-Capillary: 81 mg/dL (ref 70–99)
Glucose-Capillary: 191 mg/dL — ABNORMAL HIGH (ref 70–99)
Glucose-Capillary: 189 mg/dL — ABNORMAL HIGH (ref 70–99)
Glucose-Capillary: 138 mg/dL — ABNORMAL HIGH (ref 70–99)
Glucose-Capillary: 184 mg/dL — ABNORMAL HIGH (ref 70–99)
Glucose-Capillary: 177 mg/dL — ABNORMAL HIGH (ref 70–99)

## 2023-04-04 LAB — IRON AND TIBC
Iron: 232 ug/dL — ABNORMAL HIGH (ref 28–170)
Saturation Ratios: 77 % — ABNORMAL HIGH (ref 10.4–31.8)
TIBC: 301 ug/dL (ref 250–450)
UIBC: 69 ug/dL

## 2023-04-04 LAB — TYPE AND SCREEN
ABO/RH(D): A POS
Antibody Screen: NEGATIVE

## 2023-04-04 LAB — FERRITIN: Ferritin: 44 ng/mL (ref 11–307)

## 2023-04-04 LAB — PROTIME-INR
INR: 1 (ref 0.8–1.2)
Prothrombin Time: 13.3 s (ref 11.4–15.2)

## 2023-04-04 LAB — CBG MONITORING, ED
Glucose-Capillary: 54 mg/dL — ABNORMAL LOW (ref 70–99)
Glucose-Capillary: 69 mg/dL — ABNORMAL LOW (ref 70–99)
Glucose-Capillary: 51 mg/dL — ABNORMAL LOW (ref 70–99)

## 2023-04-04 LAB — T4, FREE: Free T4: 1.17 ng/dL — ABNORMAL HIGH (ref 0.61–1.12)

## 2023-04-04 LAB — POC OCCULT BLOOD, ED: Fecal Occult Blood: POSITIVE

## 2023-04-04 LAB — TSH: TSH: 2.604 u[IU]/mL (ref 0.350–4.500)

## 2023-04-04 LAB — ETHANOL: Alcohol, Ethyl (B): 65 mg/dL — ABNORMAL HIGH (ref <10)

## 2023-04-04 LAB — VITAMIN B12: Vitamin B-12: 543 pg/mL (ref 180–914)

## 2023-04-04 LAB — LIPASE, BLOOD: Lipase: 24 U/L (ref 11–51)

## 2023-04-04 LAB — FOLATE: Folate: 6.1 ng/mL (ref >5.9)

## 2023-04-04 MED ORDER — PROCHLORPERAZINE EDISYLATE 10 MG/2ML IJ SOLN
5.0000 mg | Freq: Four times a day (QID) | INTRAMUSCULAR | Status: DC | PRN
  Administered 2023-04-04: 5 mg via INTRAVENOUS
  Filled 2023-04-04: qty 2

## 2023-04-04 MED ORDER — IPRATROPIUM-ALBUTEROL 0.5-2.5 (3) MG/3ML IN SOLN: 3.0000 mL | Freq: Four times a day (QID) | RESPIRATORY_TRACT | Status: DC | PRN

## 2023-04-04 MED ORDER — LACTATED RINGERS IV SOLN: INTRAVENOUS | Status: DC

## 2023-04-04 MED ORDER — ACETAMINOPHEN 650 MG RE SUPP: 650.0000 mg | Freq: Four times a day (QID) | RECTAL | Status: DC | PRN

## 2023-04-04 MED ORDER — FLUTICASONE PROPIONATE 50 MCG/ACT NA SUSP
2.0000 | Freq: Every day | NASAL | Status: DC
  Administered 2023-04-04 – 2023-04-06 (×3): 2 via NASAL
  Filled 2023-04-04 (×2): qty 16

## 2023-04-04 MED ORDER — ACETAMINOPHEN 325 MG PO TABS: 650.0000 mg | ORAL_TABLET | Freq: Four times a day (QID) | ORAL | Status: DC | PRN

## 2023-04-04 MED ORDER — SILVER SULFADIAZINE 1 % EX CREA
TOPICAL_CREAM | Freq: Two times a day (BID) | CUTANEOUS | Status: DC
  Filled 2023-04-04: qty 85

## 2023-04-04 MED ORDER — MONTELUKAST SODIUM 10 MG PO TABS
10.0000 mg | ORAL_TABLET | Freq: Every day | ORAL | Status: DC
  Administered 2023-04-04 – 2023-04-05 (×2): 10 mg via ORAL
  Filled 2023-04-04 (×2): qty 1

## 2023-04-04 MED ORDER — DEXTROSE 50 % IV SOLN
INTRAVENOUS | Status: AC
  Administered 2023-04-04: 25 mL via INTRAVENOUS
  Filled 2023-04-04: qty 50

## 2023-04-04 MED ORDER — IPRATROPIUM-ALBUTEROL 20-100 MCG/ACT IN AERS: 1.0000 | INHALATION_SPRAY | Freq: Four times a day (QID) | RESPIRATORY_TRACT | Status: DC | PRN

## 2023-04-04 MED ORDER — METOPROLOL TARTRATE 25 MG PO TABS
12.5000 mg | ORAL_TABLET | Freq: Two times a day (BID) | ORAL | Status: DC
  Administered 2023-04-04 – 2023-04-06 (×4): 12.5 mg via ORAL
  Filled 2023-04-04 (×4): qty 1

## 2023-04-04 MED ORDER — HYDROXYZINE HCL 25 MG PO TABS
25.0000 mg | ORAL_TABLET | ORAL | Status: DC | PRN
  Administered 2023-04-04 – 2023-04-06 (×4): 25 mg via ORAL
  Filled 2023-04-04 (×5): qty 1

## 2023-04-04 MED ORDER — DEXTROSE 50 % IV SOLN
25.0000 mL | Freq: Once | INTRAVENOUS | Status: AC
  Administered 2023-04-04: 25 mL via INTRAVENOUS

## 2023-04-04 MED ORDER — FENTANYL CITRATE PF 50 MCG/ML IJ SOSY
50.0000 ug | PREFILLED_SYRINGE | Freq: Once | INTRAMUSCULAR | Status: AC
  Administered 2023-04-04: 50 ug via INTRAVENOUS
  Filled 2023-04-04: qty 1

## 2023-04-04 MED ORDER — MEDIHONEY WOUND/BURN DRESSING EX PSTE: 1.0000 | PASTE | Freq: Every day | CUTANEOUS | Status: DC

## 2023-04-04 MED ORDER — SUCRALFATE 1 GM/10ML PO SUSP
1.0000 g | Freq: Three times a day (TID) | ORAL | Status: DC
  Administered 2023-04-04 (×3): 1 g via ORAL
  Filled 2023-04-04 (×5): qty 10

## 2023-04-04 MED ORDER — FAMOTIDINE IN NACL 20-0.9 MG/50ML-% IV SOLN
20.0000 mg | Freq: Two times a day (BID) | INTRAVENOUS | Status: DC
  Administered 2023-04-04 – 2023-04-06 (×5): 20 mg via INTRAVENOUS
  Filled 2023-04-04 (×5): qty 50

## 2023-04-04 MED ORDER — SODIUM CHLORIDE 0.9 % IV BOLUS
1000.0000 mL | Freq: Once | INTRAVENOUS | Status: AC
  Administered 2023-04-04: 1000 mL via INTRAVENOUS

## 2023-04-04 MED ORDER — LORATADINE 10 MG PO TABS
10.0000 mg | ORAL_TABLET | Freq: Every day | ORAL | Status: DC
  Administered 2023-04-04 – 2023-04-06 (×3): 10 mg via ORAL
  Filled 2023-04-04 (×3): qty 1

## 2023-04-04 MED ORDER — BOOST / RESOURCE BREEZE PO LIQD CUSTOM
1.0000 | Freq: Three times a day (TID) | ORAL | Status: DC
  Administered 2023-04-04 – 2023-04-05 (×4): 1 via ORAL

## 2023-04-04 MED ORDER — FENTANYL CITRATE PF 50 MCG/ML IJ SOSY
25.0000 ug | PREFILLED_SYRINGE | INTRAMUSCULAR | Status: DC | PRN
  Administered 2023-04-04 – 2023-04-06 (×9): 25 ug via INTRAVENOUS
  Filled 2023-04-04 (×9): qty 1

## 2023-04-04 MED ORDER — SODIUM CHLORIDE 0.9 % IV SOLN: INTRAVENOUS | Status: DC

## 2023-04-04 MED ORDER — DEXTROSE 50 % IV SOLN
1.0000 | Freq: Once | INTRAVENOUS | Status: AC
  Administered 2023-04-04: 50 mL via INTRAVENOUS
  Filled 2023-04-04: qty 50

## 2023-04-04 MED ORDER — DEXTROSE 50 % IV SOLN: 25.0000 mL | Freq: Once | INTRAVENOUS | Status: AC

## 2023-04-04 MED ORDER — GABAPENTIN 100 MG PO CAPS
100.0000 mg | ORAL_CAPSULE | Freq: Every evening | ORAL | Status: DC | PRN
  Administered 2023-04-04: 100 mg via ORAL
  Filled 2023-04-04: qty 1

## 2023-04-04 NOTE — Consult Note (Signed)
WOC Nurse Consult Note: Reason for Consult: pressure injury Reviewed chart, images. This patient had extensive surgical debridement in this area in 2021 for necrotizing fascitis This area appears to be related to her surgeries, not pressure due to the location. Tissues are hypergranular and a few are shallow skin separation   Wound type: non healing lesions related to extensive surgical intervention; tissues are at high risk for reopening due to constant moisture and exposure to stool  Pressure Injury POA: NA Measurement:see nursing flow sheets Wound NUU:VOZDG, pink Drainage (amount, consistency, odor) clear per MD notes  Periwound:evidenced of scaring from previous wounds  Dressing procedure/placement/frequency: Location of the wounds make topical care challenging, will just try silvadene for now to keep wound protected and for the antimicrobial effects.   Teach patient to apply daily   Re consult if needed, will not follow at this time. Thanks  Kevionna Heffler [employer], RN,CWOCN, CNS, CWON-AP [phone])

## 2023-04-04 NOTE — Progress Notes (Signed)
Physical Therapy Discharge Patient Details Name: Alyssa Carey MRN: [id number] DOB: 03-29-1971 Today's Date: 04/04/2023 Time:  -     Patient discharged from PT services secondary to  at functional baseline . Pt reports working full time and lives with son, has no concerns with navigating home environment at current level.   Progress and discharge plan discussed with patient and/or caregiver: Patient/Caregiver agrees with plan   04/04/23 1412  PT Visit Information  Last PT Received On 04/04/23  Assistance Needed +1  Reason Eval/Treat Not Completed PT screened, no needs identified, will sign off  History of Present Illness a 52 year old female with a history of necrotizing fasciitis s/p diverting colostomy June 2021 complicated by parastomal hernia, SBO Jan/February 2023, previous GI bleed due to PUD, July 2023 underwent lap assisted colostomy takedown and partial colectomy, alcohol use disorder in remission, compensated cirrhosis, recent hospitalization and discharged on 01/19/2023 following upper GI bleed secondary to PUD, presents 2 days of melanotic stools and 1 week of generalized weakness and decreased oral intake.  Notably, the patient had a recent hospital admission from 03/09/2023 to 03/10/2023 secondary to GI bleed.  She underwent EGD and colonoscopy the results of which are noted below.       Alyssa Carey 04/04/2023, 2:13 PM

## 2023-04-04 NOTE — ED Notes (Signed)
Secure chatted Dr. Dorthula Perfect of blood sugar 69

## 2023-04-04 NOTE — Progress Notes (Signed)
Nurse at bedside,patient c/o pain to her buttocks rated a 8 sharp and throbbing. Patient given Fentanyl 25 mcg IV given per MAR prn. Plan of care on going.

## 2023-04-04 NOTE — ED Notes (Signed)
Received report from [facility]. Pt resting. C/o pain to "bottom" rating 9. A/o. Ambulated to bathroom. Wanting drink. Advised NPO at this time. Aware awaiting admission bed

## 2023-04-04 NOTE — Progress Notes (Addendum)
   04/04/23 1552  Vitals  Temp 98.6 F (37 C)  Temp Source Oral  BP 104/64  MAP (mmHg) 77  BP Location Left Arm  BP Method Automatic  Patient Position (if appropriate) Lying  Pulse Rate (!) 133  Pulse Rate Source Dinamap  Level of Consciousness  Level of Consciousness Alert  MEWS COLOR  MEWS Score Color Yellow  Oxygen Therapy  SpO2 98 %  O2 Device Room Air  MEWS Score  MEWS Temp 0  MEWS Systolic 0  MEWS Pulse 3  MEWS RR 0  MEWS LOC 0  MEWS Score 3  Provider Notification  Provider Name/Title Dr Dorthula Perfect  Date Provider Notified 04/04/23  Time Provider Notified 1556  Method of Notification Page  Notification Reason Critical Result (hr 133)  Test performed and critical result vital signs  Date Critical Result Received 04/04/23  Time Critical Result Received 1552

## 2023-04-04 NOTE — Progress Notes (Signed)
   04/04/23 1552  Vitals  Temp 98.6 F (37 C)  Temp Source Oral  BP 104/64  MAP (mmHg) 77  BP Location Left Arm  BP Method Automatic  Patient Position (if appropriate) Lying  Pulse Rate (!) 133  Pulse Rate Source Dinamap  Level of Consciousness  Level of Consciousness Alert  MEWS COLOR  MEWS Score Color Yellow  Oxygen Therapy  SpO2 98 %  O2 Device Room Air  MEWS Score  MEWS Temp 0  MEWS Systolic 0  MEWS Pulse 3  MEWS RR 0  MEWS LOC 0  MEWS Score 3  Provider Notification  Provider Name/Title Dr Dorthula Perfect  Date Provider Notified 04/04/23  Time Provider Notified 1556  Method of Notification Page  Notification Reason Critical Result (hr 133)  Test performed and critical result vital signs  Date Critical Result Received 04/04/23  Time Critical Result Received 1552

## 2023-04-04 NOTE — H&P (Signed)
History and Physical    Patient: Alyssa Carey:[id number] DOB: 1971-03-07 DOA: 04/04/2023 DOS: the patient was seen and examined on 04/04/2023 PCP: Richardean Chimera, MD  Patient coming from: Home  Chief Complaint: No chief complaint on file.  HPI: Alyssa Carey is a 52 year old female with a history of necrotizing fasciitis s/p diverting colostomy June 2021 complicated by parastomal hernia, SBO Jan/February 2023, previous GI bleed due to PUD, July 2023 underwent lap assisted colostomy takedown and partial colectomy, alcohol use disorder in remission, compensated cirrhosis, recent hospitalization and discharged on 01/19/2023 following upper GI bleed secondary to PUD, presents 2 days of melanotic stools and 1 week of generalized weakness and decreased oral intake.  Notably, the patient had a recent hospital admission from 03/09/2023 to 03/10/2023 secondary to GI bleed.  She underwent EGD and colonoscopy the results of which are noted below.  She was discharged home with omeprazole twice daily.  She has an intolerance to pantoprazole.  She denies any alcohol or illicit drug use.  She has never smoked.  She denies any NSAIDs.  She continues to have loose stools without any hematochezia but complains of some melanotic stool.  She denies any abdominal pain or hematemesis.  She has not had fevers, chills, chest pain, shortness breath, coughing, hemoptysis. In the ED, the patient was afebrile hemodynamically stable with oxygen saturation 99% room air.  WBC 5.4, hemoglobin 10.3, platelets 192,000.  Sodium 131, potassium 4.1, bicarbonate 20, serum creatinine 0.60.  FOBT was positive.  GI was consulted to assist with management.  Notably, the patient had a hospital admission from 01/16/2023 to 01/19/2023 for acute anemia.  She underwent EGD on 01/17/2023 which showed prepyloric and gastric ulcers.  03/10/2023 EGD and colon:   EGD - Normal esophagus. - Z- line regular. - Non- bleeding gastric ulcer with a clean  ulcer base ( Forrest Class III) . Biopsied. - Normal duodenal bulb and second portion of the duodenum.   Colonoscopy:   The examined portion of the ileum was normal.  - A few erosions in the sigmoid colon. Biopsied.  - Diverticulosis in the left colon. There was no evidence of diverticular bleeding.  - Erythematous mucosa in the rectum. Biopsied.  - One 3 mm polyp in the descending colon, removed with a cold biopsy forceps. Resected and retrieved.  - Non- bleeding external and internal hemorrhoids.  - Sacral decubitus stage 2 or stage 3 with clear discharge  Review of Systems: As mentioned in the history of present illness. All other systems reviewed and are negative. Past Medical History:  Diagnosis Date   Alcohol abuse    HTN (hypertension) 10/05/2020   Necrotizing fasciitis [facility])    Past Surgical History:  Procedure Laterality Date   BIOPSY  10/07/2020   Procedure: BIOPSY;  Surgeon: Corbin Ade, MD;  [facility];  Service: Endoscopy;;   BIOPSY  03/10/2023   Procedure: BIOPSY;  Surgeon: Franky Macho, MD;  [facility];  Service: Endoscopy;;   COLONOSCOPY WITH PROPOFOL N/A 10/07/2020   single healing rectal ulcer in distal rectum with surrounding mucosal friability and markedly abnormal proximal colon with ulceration s/p biopsies.   COLONOSCOPY WITH PROPOFOL  02/22/2021   Procedure: COLONOSCOPY WITH PROPOFOL;  Surgeon: Lanelle Bal, DO;  [facility];  Service: Endoscopy;;   COLONOSCOPY WITH PROPOFOL N/A 03/10/2023   Procedure: COLONOSCOPY WITH PROPOFOL;  Surgeon: Franky Macho, MD;  [facility];  Service: Endoscopy;  Laterality: N/A;   COLOSTOMY TAKEDOWN N/A 01/26/2022   Procedure: LAPAROSCOPIC COLOSTOMY TAKEDOWN;  Surgeon: Romie Levee, MD;  Location: [facility];  Service: General;  Laterality: N/A;  laparoscopic assisted colostomy reversal and partial colectomy   ESOPHAGOGASTRODUODENOSCOPY (EGD) WITH PROPOFOL N/A  10/07/2020   non-bleeding gastric ulcer . Pathology with H.pylori negative   ESOPHAGOGASTRODUODENOSCOPY (EGD) WITH PROPOFOL N/A 12/27/2020   Procedure: ESOPHAGOGASTRODUODENOSCOPY (EGD) WITH PROPOFOL;  Surgeon: Lanelle Bal, DO;  [facility];  Service: Endoscopy;  Laterality: N/A;  1:00pm   ESOPHAGOGASTRODUODENOSCOPY (EGD) WITH PROPOFOL N/A 01/17/2023   Procedure: ESOPHAGOGASTRODUODENOSCOPY (EGD) WITH PROPOFOL;  Surgeon: Corbin Ade, MD;  [facility];  Service: Endoscopy;  Laterality: N/A;   ESOPHAGOGASTRODUODENOSCOPY (EGD) WITH PROPOFOL N/A 03/10/2023   Procedure: ESOPHAGOGASTRODUODENOSCOPY (EGD) WITH PROPOFOL;  Surgeon: Franky Macho, MD;  [facility];  Service: Endoscopy;  Laterality: N/A;   FLEXIBLE SIGMOIDOSCOPY N/A 01/11/2020   Procedure: FLEXIBLE SIGMOIDOSCOPY;  Surgeon: Jeani Hawking, MD;  [facility];  Service: Gastroenterology;  Laterality: N/A;   INCISION AND DRAINAGE PERIRECTAL ABSCESS N/A 12/25/2019   Procedure: IRRIGATION AND DEBRIDEMENT BUTTOCKS, LAP LOOP COLOSTOMY;  Surgeon: Gaynelle Adu, MD;  Location: [facility];  Service: General;  Laterality: N/A;   IRRIGATION AND DEBRIDEMENT ABSCESS N/A 12/23/2019   Procedure: EXCISION AND DEBRIDEMENT LEFT BUTTOCK AND PERINEUM;  Surgeon: Berna Bue, MD;  Location: [facility];  Service: General;  Laterality: N/A;   LAPAROSCOPIC LOOP COLOSTOMY N/A 12/25/2019   Procedure: LAPAROSCOPIC LOOP COLOSTOMY;  Surgeon: Gaynelle Adu, MD;  Location: [facility];  Service: General;  Laterality: N/A;   POLYPECTOMY  03/10/2023   Procedure: POLYPECTOMY;  Surgeon: Franky Macho, MD;  [facility];  Service: Endoscopy;;   TONSILLECTOMY     Social History:  reports that she has never smoked. She has never been exposed to tobacco smoke. She has never used smokeless tobacco. She reports that she does not currently use alcohol. She reports that she does not currently use drugs.  Allergies  Allergen  Reactions   Pantoprazole     Stomach pain   Penicillins Hives   Oxycodone Hcl Rash    Family History  Problem Relation Age of Onset   Colon polyps Mother        does not believe adenomas   Arrhythmia Mother    Colon cancer Neg Hx     Prior to Admission medications   Medication Sig Start Date End Date Taking? Authorizing Provider  fluconazole (DIFLUCAN) 150 MG tablet Take 150 mg by mouth every 3 (three) days. 02/15/23   [provider]  fluticasone (FLONASE) 50 MCG/ACT nasal spray Place 2 sprays into both nostrils daily. 09/15/22   [provider]  gabapentin (NEURONTIN) 100 MG capsule Take 1 capsule (100 mg total) by mouth at bedtime as needed (pain). 11/13/22   Vassie Loll, MD  hydrocortisone (ANUSOL-HC) 2.5 % rectal cream Place 1 Application rectally 2 (two) times daily. 12/05/22   Gelene Mink, NP  hydrOXYzine (ATARAX) 25 MG tablet Take 25 mg by mouth as needed for itching. 09/28/22   [provider]  Ipratropium-Albuterol (COMBIVENT) 20-100 MCG/ACT AERS respimat Inhale 1 puff into the lungs every 6 (six) hours as needed for wheezing or shortness of breath. 11/13/22   Vassie Loll, MD  loratadine (CLARITIN) 10 MG tablet Take 1 tablet (10 mg total) by mouth daily. 11/14/22   Vassie Loll, MD  montelukast (SINGULAIR) 10 MG tablet Take 10 mg by mouth  at bedtime. 09/19/22   [provider]  Nystatin (GERHARDT'S BUTT CREAM) CREA Apply 1 Application topically 4 (four) times daily as needed for irritation. 11/13/22   Vassie Loll, MD  omeprazole (PRILOSEC) 40 MG capsule Take 1 capsule (40 mg total) by mouth daily. 03/10/23   Elease Etienne, MD    Physical Exam: Vitals:   04/04/23 0316 04/04/23 0319 04/04/23 0543 04/04/23 0700  BP: 134/89 134/89 111/71 110/80  Pulse: 76 76 91 93  Resp: 19 18 19 20   Temp: 99.1 F (37.3 C)     SpO2: 99% 99% 99% 92%   GENERAL:  A&O x 3, NAD, well developed, cooperative, follows commands HEENT: [place]/AT, No thrush, No  icterus, No oral ulcers Neck:  No neck mass, No meningismus, soft, supple CV: RRR, no S3, no S4, no rub, no JVD Lungs:  CTA, no wheeze, no rhonchi, good air movement Abd: soft/NT +BS, nondistended Ext: No edema, no lymphangitis, no cyanosis, no rashes Neuro:  CN II-XII intact, strength 4/5 in RUE, RLE, strength 4/5 LUE, LLE; sensation intact bilateral; no dysmetria; babinski equivocal  Data Reviewed: Data reviewed above in history  Assessment and Plan: Melanotic stool -Patient has intolerance to pantoprazole -GI consulted -Restart omeprazole when okay with GI -Clear liquid diet -Hemoglobin stable since discharge 20 2424  Generalized weakness -Obtain UA -UDS -B12 -TSH -PT evaluation -Folic acid  Transaminasemia -GI consult -03/09/2023 CT abdomen negative for acute findings; cholelithiasis with fatty liver -RUQ ultrasound  Hyponatremia -Secondary to liver cirrhosis -Clinically euvolemic -Appears to be chronic since April 2024  Stage II sacral decubitus ulcer -Ulcer is clean without signs of infection on examination -Wound care consult -Pictures taken  Loose stools -Check C. Difficile -Stool pathogen panel  Alcoholic liver cirrhosis -Appears clinically compensated    Advance Care Planning: FULL CODE  Consults: GI  Family Communication: none  Severity of Illness: The appropriate patient status for this patient is OBSERVATION. Observation status is judged to be reasonable and necessary in order to provide the required intensity of service to ensure the patient's safety. The patient's presenting symptoms, physical exam findings, and initial radiographic and laboratory data in the context of their medical condition is felt to place them at decreased risk for further clinical deterioration. Furthermore, it is anticipated that the patient will be medically stable for discharge from the hospital within 2 midnights of admission.       Author: Catarina Hartshorn,  MD 04/04/2023 8:26 AM  For on call review [email or web address].

## 2023-04-04 NOTE — Progress Notes (Signed)
Critical EKG results given to RN.

## 2023-04-04 NOTE — Hospital Course (Signed)
52 year old female with a history of necrotizing fasciitis s/p diverting colostomy June 2021 complicated by parastomal hernia, SBO Jan/February 2023, previous GI bleed due to PUD, July 2023 underwent lap assisted colostomy takedown and partial colectomy, alcohol use disorder in remission, compensated cirrhosis, recent hospitalization and discharged on 01/19/2023 following upper GI bleed secondary to PUD, presents 2 days of melanotic stools and 1 week of generalized weakness and decreased oral intake.  Notably, the patient had a recent hospital admission from 03/09/2023 to 03/10/2023 secondary to GI bleed.  She underwent EGD and colonoscopy the results of which are noted below.  She was discharged home with omeprazole twice daily.  She has an intolerance to pantoprazole.  She denies any alcohol or illicit drug use.  She has never smoked.  She denies any NSAIDs.  She continues to have loose stools without any hematochezia but complains of some melanotic stool.  She denies any abdominal pain or hematemesis.  She has not had fevers, chills, chest pain, shortness breath, coughing, hemoptysis. In the ED, the patient was afebrile hemodynamically stable with oxygen saturation 99% room air.  WBC 5.4, hemoglobin 10.3, platelets 192,000.  Sodium 131, potassium 4.1, bicarbonate 20, serum creatinine 0.60.  FOBT was positive.  GI was consulted to assist with management.  Notably, the patient had a hospital admission from 01/16/2023 to 01/19/2023 for acute anemia.  She underwent EGD on 01/17/2023 which showed prepyloric and gastric ulcers.  03/10/2023 EGD and colon:   EGD - Normal esophagus. - Z- line regular. - Non- bleeding gastric ulcer with a clean ulcer base ( Forrest Class III) . Biopsied. - Normal duodenal bulb and second portion of the duodenum.   Colonoscopy:   The examined portion of the ileum was normal.  - A few erosions in the sigmoid colon. Biopsied.  - Diverticulosis in the left colon. There was no evidence of  diverticular bleeding.  - Erythematous mucosa in the rectum. Biopsied.  - One 3 mm polyp in the descending colon, removed with a cold biopsy forceps. Resected and retrieved.  - Non- bleeding external and internal hemorrhoids.  - Sacral decubitus stage 2 or stage 3 with clear discharge

## 2023-04-04 NOTE — ED Notes (Signed)
Pt recheck CBG 51. EDP verbal order 1/2 amp Dextrose 50%. Upon entering pt's room MD hospitalist verbal whole amp given.

## 2023-04-04 NOTE — Progress Notes (Signed)
Initial Nutrition Assessment  DOCUMENTATION CODES:   Not applicable  INTERVENTION:   Boost Breeze po TID, each supplement provides 250 kcal and 9 grams of protein  NUTRITION DIAGNOSIS:   Inadequate oral intake related to altered GI function as evidenced by per patient/family report.  GOAL:   Patient will meet greater than or equal to 90% of their needs  MONITOR:   Diet advancement, PO intake, Supplement acceptance  REASON FOR ASSESSMENT:   Malnutrition Screening Tool    ASSESSMENT:   52 yo female admitted with melanotic stool. PMH includes necrotizing fasciitis s/p diverting colostomy 2021, hernia, SBO, GIB, PUD, HTN, alcohol use, compensated cirrhosis.  Patient reports poor appetite for several months d/t recent hospitalizations for GI bleed. She suspects she has lost weight, but is unable to quantify. She agreed to try Boost Breeze supplement to help maximize intake of protein and calories.   GI consult pending.  No weight available this admission. From 12/05/22 to 03/09/23, patient lost ~11 lbs (6% weight loss in 3 months).   Labs reviewed. Na 131 CBG: [phone]  Medications reviewed and include Silvadene, Carafate, IV Pepcid. IVF: LR at 10 ml/h  Patient is at increased nutrition risk given recent poor intake, weight loss, current clear liquid diet.   NUTRITION - FOCUSED PHYSICAL EXAM:  Flowsheet Row Most Recent Value  Orbital Region No depletion  Upper Arm Region No depletion  Thoracic and Lumbar Region No depletion  Buccal Region No depletion  Temple Region No depletion  Clavicle Bone Region No depletion  Clavicle and Acromion Bone Region No depletion  Scapular Bone Region No depletion  Dorsal Hand No depletion  Patellar Region Unable to assess  Anterior Thigh Region Unable to assess  Posterior Calf Region Unable to assess  Edema (RD Assessment) Unable to assess  Hair Reviewed  Eyes Reviewed  Mouth Reviewed  Skin Reviewed  Nails Reviewed        Diet Order:   Diet Order             Diet clear liquid Room service appropriate? Yes; Fluid consistency: Thin  Diet effective now                   EDUCATION NEEDS:   Not appropriate for education at this time  Skin:  Skin Assessment: Skin Integrity Issues: Skin Integrity Issues:: Other (Comment) Other: non pressure wound to sacrum/rectal area from previous surgery for necrotizing fasciitis in 2021  Last BM:  9/18 type 7, black  Height:   Ht Readings from Last 1 Encounters:  03/09/23 4\' 9"  (1.448 m)    Weight:   Wt Readings from Last 1 Encounters:  03/09/23 82.4 kg    Ideal Body Weight:  43.2 kg  BMI: 39.3  Estimated Nutritional Needs:   Kcal:  1600-1800  Protein:  75-85 gm  Fluid:  1.6-1.8 L   Gabriel Rainwater RD, LDN, CNSC Please refer to Amion for contact information.

## 2023-04-04 NOTE — ED Triage Notes (Addendum)
Pt states she has had black stools x2 days, generalized weakness and dizziness upon standing.   Hx of gastric ulcers

## 2023-04-04 NOTE — ED Provider Notes (Signed)
[place] [facility]ERGENCY DEPARTMENT AT [facility]  Provider Note  CSN: [id number] Arrival date & time: 04/04/23 0301  History No chief complaint on file.   NEKEIA SHELER is a 52 y.o. female with history of PUD, upper and lower GI bleeding in the past has also recently had colosctomy take down. She reports 2 days of black stools, generalized abdominal pain and orthostatic weakness. She is not on a blood thinner. No vomiting.    Home Medications Prior to Admission medications   Medication Sig Start Date End Date Taking? Authorizing Provider  fluconazole (DIFLUCAN) 150 MG tablet Take 150 mg by mouth every 3 (three) days. 02/15/23   [provider]  fluticasone (FLONASE) 50 MCG/ACT nasal spray Place 2 sprays into both nostrils daily. 09/15/22   [provider]  gabapentin (NEURONTIN) 100 MG capsule Take 1 capsule (100 mg total) by mouth at bedtime as needed (pain). 11/13/22   Vassie Loll, MD  hydrocortisone (ANUSOL-HC) 2.5 % rectal cream Place 1 Application rectally 2 (two) times daily. 12/05/22   Gelene Mink, NP  hydrOXYzine (ATARAX) 25 MG tablet Take 25 mg by mouth as needed for itching. 09/28/22   [provider]  Ipratropium-Albuterol (COMBIVENT) 20-100 MCG/ACT AERS respimat Inhale 1 puff into the lungs every 6 (six) hours as needed for wheezing or shortness of breath. 11/13/22   Vassie Loll, MD  loratadine (CLARITIN) 10 MG tablet Take 1 tablet (10 mg total) by mouth daily. 11/14/22   Vassie Loll, MD  montelukast (SINGULAIR) 10 MG tablet Take 10 mg by mouth at bedtime. 09/19/22   [provider]  Nystatin (GERHARDT'S BUTT CREAM) CREA Apply 1 Application topically 4 (four) times daily as needed for irritation. 11/13/22   Vassie Loll, MD  omeprazole (PRILOSEC) 40 MG capsule Take 1 capsule (40 mg total) by mouth daily. 03/10/23   Elease Etienne, MD     Allergies    Pantoprazole, Penicillins, and Oxycodone hcl   Review of Systems    Review of Systems Please see HPI for pertinent positives and negatives  Physical Exam BP 134/89   Pulse 76   Temp 99.1 F (37.3 C)   Resp 18   LMP 11/30/2021   SpO2 99%   Physical Exam Vitals and nursing note reviewed. Exam conducted with a chaperone present.  Constitutional:      Appearance: Normal appearance.  HENT:     Head: Normocephalic and atraumatic.     Nose: Nose normal.     Mouth/Throat:     Mouth: Mucous membranes are moist.  Eyes:     Extraocular Movements: Extraocular movements intact.     Conjunctiva/sclera: Conjunctivae normal.  Cardiovascular:     Rate and Rhythm: Normal rate.  Pulmonary:     Effort: Pulmonary effort is normal.     Breath sounds: Normal breath sounds.  Abdominal:     General: Abdomen is flat.     Palpations: Abdomen is soft.     Tenderness: There is abdominal tenderness (generalized).  Genitourinary:    Comments: Pressure ulcer of sacrum and peri-rectal area. Dark brown/black stool is heme positive.  Musculoskeletal:        General: No swelling. Normal range of motion.     Cervical back: Neck supple.  Skin:    General: Skin is warm and dry.  Neurological:     General: No focal deficit present.     Mental Status: She is alert.  Psychiatric:  Mood and Affect: Mood normal.     ED Results / Procedures / Treatments   EKG None  Procedures Procedures  Medications Ordered in the ED Medications  sodium chloride 0.9 % bolus 1,000 mL (1,000 mLs Intravenous New Bag/Given 04/04/23 0414)    And  0.9 %  sodium chloride infusion (has no administration in time range)  fentaNYL (SUBLIMAZE) injection 50 mcg (50 mcg Intravenous Given 04/04/23 0435)    Initial Impression and Plan  Patient with history of PUD and UGI bleeding, here for melena, has heme positive stool here. Vitals otherwise reassuring. Will check labs. Begin IVF. She has allergy to PPI.   ED Course   Clinical Course as of 04/04/23 0520  Wed Apr 04, 2023  0412 CBC  with Hgb at baseline. INR is neg.  [CS]  0420 CMP with elevated liver enzymes, similar to previous. Lipase is normal. She does not need emergent endoscopy or transfusion. Will discuss admission with hospitalist for GI evaluation in AM.  [CS]  [phone] Patient requesting pain medication for her rectal pain/skin breakdown. Her abdomen remains soft.  [CS]  [phone] Spoke with Dr. Carren Rang, Hospitalist, who will evaluate for admission. Secure chat message sent to GI.  [CS]    Clinical Course User Index [CS] Pollyann Savoy, MD     MDM Rules/Calculators/A&P Medical Decision Making Problems Addressed: Skin ulcer of sacrum, unspecified ulcer stage [facility]): chronic illness or injury Upper GI bleeding: acute illness or injury  Amount and/or Complexity of Data Reviewed Labs: ordered. Decision-making details documented in ED Course.  Risk Prescription drug management. Parenteral controlled substances. Decision regarding hospitalization.     Final Clinical Impression(s) / ED Diagnoses Final diagnoses:  Upper GI bleeding  Skin ulcer of sacrum, unspecified ulcer stage [facility])    Rx / DC Orders ED Discharge Orders     None        Pollyann Savoy, MD 04/04/23 [id number]

## 2023-04-04 NOTE — Consult Note (Signed)
Gastroenterology Consult   Referring Provider: No ref. provider found Primary Care Physician:  Richardean Chimera, MD Primary Gastroenterologist:  Dr. Tasia Catchings   Patient ID: Alyssa Carey; [id number]; 06-25-1971   Admit date: 04/04/2023  LOS: 0 days   Date of Consultation: 04/04/2023  Reason for Consultation:  melena/PUD, elevated liver enzymes   History of Present Illness   Alyssa Carey is a 52 y.o. year old female with history of necrotizing fasciitis s/p diverting colostomy June 2021 complicated by parastomal hernia, SBO Jan/February 2023, previous GI bleed due to PUD, July 2023 underwent lap assisted colostomy takedown and partial colectomy, alcohol use disorder in remission, compensated cirrhosis, recent hospitalization 01/19/2023 following UGIB secondary to PUD and another admission 8/23-8/24 for the same, who presents with 2 days of melanotic stools and 1 week of generalized weakness and decreased oral intake. GI consulted for further evaluation   Hgb stable at  10.3 (up from 9.2 three weeks ago) FOBT positive  RUQ [place] pending   Consult:  Patient states melena that began yesterday. She denies abdominal pain. Reports pain in her bottom from known sacral wound. No BRBPR. She had some nausea/vomiting this morning which she received anti emetics for with relief. She is still having diarrhea, history of the same. She takes colestipol at home with good results. She denies NSAID use. Appetite is still down and still has some early satiety. She denies any alcohol use, reports last ETOH intake about 2 years ago. She is taking prilosec at home. She did have a BM this morning which was still black in color.   Last EGD 02/2023:- Normal esophagus.                           - Z-line regular.                           - Non-bleeding gastric ulcer with a clean ulcer                            base (Forrest Class III). Biopsied.                           - Normal duodenal bulb and second portion of  the                            duodenum. Last Colon 02/2023;- The examined portion of the ileum was normal.                           - A few erosions in the sigmoid colon. Biopsied.                           - Diverticulosis in the left colon. There was no                            evidence of diverticular bleeding.                           - Erythematous mucosa in the rectum. Biopsied.                           -  One 3 mm polyp in the descending colon, removed                            with a cold biopsy forceps. Resected and retrieved.                           - Non-bleeding external and internal hemorrhoids.                           -Sacral decubitus stage 2 or stage 3 with clear                            discharge Past Medical History:  Diagnosis Date   Alcohol abuse    HTN (hypertension) 10/05/2020   Necrotizing fasciitis [facility])     Past Surgical History:  Procedure Laterality Date   BIOPSY  10/07/2020   Procedure: BIOPSY;  Surgeon: Corbin Ade, MD;  [facility];  Service: Endoscopy;;   BIOPSY  03/10/2023   Procedure: BIOPSY;  Surgeon: Franky Macho, MD;  [facility]: [facility];  Service: Endoscopy;;   COLONOSCOPY WITH PROPOFOL N/A 10/07/2020   single healing rectal ulcer in distal rectum with surrounding mucosal friability and markedly abnormal proximal colon with ulceration s/p biopsies.   COLONOSCOPY WITH PROPOFOL  02/22/2021   Procedure: COLONOSCOPY WITH PROPOFOL;  Surgeon: Lanelle Bal, DO;  [facility];  Service: Endoscopy;;   COLONOSCOPY WITH PROPOFOL N/A 03/10/2023   Procedure: COLONOSCOPY WITH PROPOFOL;  Surgeon: Franky Macho, MD;  [facility];  Service: Endoscopy;  Laterality: N/A;   COLOSTOMY TAKEDOWN N/A 01/26/2022   Procedure: LAPAROSCOPIC COLOSTOMY TAKEDOWN;  Surgeon: Romie Levee, MD;  Location: [facility];  Service: General;  Laterality: N/A;  laparoscopic assisted colostomy reversal and partial colectomy    ESOPHAGOGASTRODUODENOSCOPY (EGD) WITH PROPOFOL N/A 10/07/2020   non-bleeding gastric ulcer . Pathology with H.pylori negative   ESOPHAGOGASTRODUODENOSCOPY (EGD) WITH PROPOFOL N/A 12/27/2020   Procedure: ESOPHAGOGASTRODUODENOSCOPY (EGD) WITH PROPOFOL;  Surgeon: Lanelle Bal, DO;  [facility];  Service: Endoscopy;  Laterality: N/A;  1:00pm   ESOPHAGOGASTRODUODENOSCOPY (EGD) WITH PROPOFOL N/A 01/17/2023   Procedure: ESOPHAGOGASTRODUODENOSCOPY (EGD) WITH PROPOFOL;  Surgeon: Corbin Ade, MD;  [facility];  Service: Endoscopy;  Laterality: N/A;   ESOPHAGOGASTRODUODENOSCOPY (EGD) WITH PROPOFOL N/A 03/10/2023   Procedure: ESOPHAGOGASTRODUODENOSCOPY (EGD) WITH PROPOFOL;  Surgeon: Franky Macho, MD;  [facility];  Service: Endoscopy;  Laterality: N/A;   FLEXIBLE SIGMOIDOSCOPY N/A 01/11/2020   Procedure: FLEXIBLE SIGMOIDOSCOPY;  Surgeon: Jeani Hawking, MD;  [facility]: [facility];  Service: Gastroenterology;  Laterality: N/A;   INCISION AND DRAINAGE PERIRECTAL ABSCESS N/A 12/25/2019   Procedure: IRRIGATION AND DEBRIDEMENT BUTTOCKS, LAP LOOP COLOSTOMY;  Surgeon: Gaynelle Adu, MD;  Location: [facility];  Service: General;  Laterality: N/A;   IRRIGATION AND DEBRIDEMENT ABSCESS N/A 12/23/2019   Procedure: EXCISION AND DEBRIDEMENT LEFT BUTTOCK AND PERINEUM;  Surgeon: Berna Bue, MD;  Location: [facility];  Service: General;  Laterality: N/A;   LAPAROSCOPIC LOOP COLOSTOMY N/A 12/25/2019   Procedure: LAPAROSCOPIC LOOP COLOSTOMY;  Surgeon: Gaynelle Adu, MD;  Location: [facility];  Service: General;  Laterality: N/A;   POLYPECTOMY  03/10/2023   Procedure: POLYPECTOMY;  Surgeon: Franky Macho, MD;  [facility];  Service: Endoscopy;;  TONSILLECTOMY      Prior to Admission medications   Medication Sig Start Date End Date Taking? Authorizing Provider  fluconazole (DIFLUCAN) 150 MG tablet Take 150 mg by mouth every 3 (three) days. 02/15/23   [provider]  fluticasone (FLONASE) 50 MCG/ACT nasal spray Place 2 sprays into both nostrils daily. 09/15/22   [provider]  gabapentin (NEURONTIN) 100 MG capsule Take 1 capsule (100 mg total) by mouth at bedtime as needed (pain). 11/13/22   Vassie Loll, MD  hydrocortisone (ANUSOL-HC) 2.5 % rectal cream Place 1 Application rectally 2 (two) times daily. 12/05/22   Gelene Mink, NP  hydrOXYzine (ATARAX) 25 MG tablet Take 25 mg by mouth as needed for itching. 09/28/22   [provider]  Ipratropium-Albuterol (COMBIVENT) 20-100 MCG/ACT AERS respimat Inhale 1 puff into the lungs every 6 (six) hours as needed for wheezing or shortness of breath. 11/13/22   Vassie Loll, MD  loratadine (CLARITIN) 10 MG tablet Take 1 tablet (10 mg total) by mouth daily. 11/14/22   Vassie Loll, MD  montelukast (SINGULAIR) 10 MG tablet Take 10 mg by mouth at bedtime. 09/19/22   [provider]  Nystatin (GERHARDT'S BUTT CREAM) CREA Apply 1 Application topically 4 (four) times daily as needed for irritation. 11/13/22   Vassie Loll, MD  omeprazole (PRILOSEC) 40 MG capsule Take 1 capsule (40 mg total) by mouth daily. 03/10/23   Elease Etienne, MD    Current Facility-Administered Medications  Medication Dose Route Frequency Provider Last Rate Last Admin   0.9 %  sodium chloride infusion   Intravenous Continuous Pollyann Savoy, MD       acetaminophen (TYLENOL) tablet 650 mg  650 mg Oral Q6H PRN Tat, Onalee Hua, MD       Or   acetaminophen (TYLENOL) suppository 650 mg  650 mg Rectal Q6H PRN Tat, Onalee Hua, MD       famotidine (PEPCID) IVPB 20 mg premix  20 mg Intravenous Q12H Tat, Onalee Hua, MD       fentaNYL (SUBLIMAZE) injection 25 mcg  25 mcg Intravenous Q2H PRN Tat, Onalee Hua, MD       fluticasone (FLONASE) 50 MCG/ACT nasal spray 2 spray  2 spray Each Nare Daily Tat, David, MD       gabapentin (NEURONTIN) capsule 100 mg  100 mg Oral QHS PRN Tat, Onalee Hua, MD       hydrOXYzine (ATARAX) tablet 25 mg  25 mg Oral PRN  Tat, Onalee Hua, MD       Ipratropium-Albuterol (COMBIVENT) respimat 1 puff  1 puff Inhalation Q6H PRN Tat, Onalee Hua, MD       lactated ringers infusion   Intravenous Continuous Windell Norfolk, MD 10 mL/hr at 04/04/23 0814 New Bag at 04/04/23 0814   loratadine (CLARITIN) tablet 10 mg  10 mg Oral Daily Tat, David, MD       montelukast (SINGULAIR) tablet 10 mg  10 mg Oral QHS Catarina Hartshorn, MD       prochlorperazine (COMPAZINE) injection 5 mg  5 mg Intravenous Q6H PRN Tat, Onalee Hua, MD       silver sulfADIAZINE (SILVADENE) 1 % cream   Topical BID Zierle-Ghosh, Asia B, DO        Allergies as of 04/04/2023 - Review Complete 04/04/2023  Allergen Reaction Noted   Pantoprazole  02/17/2021   Penicillins Hives 12/21/2019   Oxycodone hcl Rash 10/06/2020    Review of Systems   Gen: Denies any fever, chills, loss of appetite, change in  weight or weight loss CV: Denies chest pain, heart palpitations, syncope, edema  Resp: Denies shortness of breath with rest, cough, wheezing, coughing up blood, and pleurisy. GI: +melena +early satiety +decreased appetite  GU : Denies urinary burning, blood in urine, urinary frequency, and urinary incontinence. MS: Denies joint pain, limitation of movement, swelling, cramps, and atrophy.  Derm: Denies rash, itching, dry skin, hives. Psych: Denies depression, anxiety, memory loss, hallucinations, and confusion. Heme: Denies bruising or bleeding Neuro:  Denies any headaches, dizziness, paresthesias, shaking  Physical Exam   Vital Signs in last 24 hours: Temp:  [99.1 F (37.3 C)] 99.1 F (37.3 C) (09/18 0316) Pulse Rate:  [76-100] 100 (09/18 0848) Resp:  [17-20] 17 (09/18 0848) BP: (105-134)/(62-89) 105/62 (09/18 0848) SpO2:  [92 %-100 %] 100 % (09/18 0848) Last BM Date : 04/04/23  General:   Alert,  Well-developed, well-nourished, pleasant and cooperative in NAD Head:  Normocephalic and atraumatic. Eyes:  Sclera clear, no icterus.   Conjunctiva pink. Ears:  Normal  auditory acuity. Mouth:  No deformity or lesions, dentition normal. Neck:  Supple; no masses Lungs:  Clear throughout to auscultation.   No wheezes, crackles, or rhonchi. No acute distress. Heart:  Regular rate and rhythm; no murmurs, clicks, rubs,  or gallops. Abdomen:  Soft, nontender and nondistended. No masses, hepatosplenomegaly or hernias noted. Normal bowel sounds, without guarding, and without rebound.   Msk:  Symmetrical without gross deformities. Normal posture. Extremities:  Without clubbing or edema. Neurologic:  Alert and  oriented x4. Skin:  Intact without significant lesions or rashes. Psych:  Alert and cooperative. Normal mood and affect.   Labs/Studies   Recent Labs Recent Labs    04/04/23 0349  WBC 5.4  HGB 10.3*  HCT 32.4*  PLT 192   BMET Recent Labs    04/04/23 0349  NA 131*  K 4.1  CL 94*  CO2 20*  GLUCOSE 71  BUN 7  CREATININE 0.60  CALCIUM 8.3*   LFT Recent Labs    04/04/23 0349  PROT 7.7  ALBUMIN 3.2*  AST 380*  ALT 99*  ALKPHOS 538*  BILITOT 2.0*   PT/INR Recent Labs    04/04/23 0349  LABPROT 13.3  INR 1.0    Assessment   Alyssa Carey is a 52 y.o. year old female with recent admission last month for GI bleed, found to have PUD on EGD, who presents to the hospital with melena x2 days, FOBT positive. GI consulted for further evaluation  Melena/History of PUD: Hemoglobin actually improved from a few weeks ago, up to 10.3. started on famotidine drip. Recent EGD with findings of non bleeding gastric ulcer with clean ulcer base, Forrest class III. Started on omeprazole 40mg  daily and advised to repeat EGD in 12 weeks (around 11/16). She reports compliance with PPI at home and denies NSAIDs.  Appetite still decreased and with early satiety. No abdominal pain. Suspect melena is secondary to known PUD. Given hemoglobin is stable, would recommend continuing famotidine drip as she is allergic to protonix, start carafate 1g QID. Will trend  H&H, as long as no overt drops in hemoglobin, plan to hold off on repeat EGD until due as outpatient in November.   Elevated Liver enzymes/cirrhosis: LFTs have trended up since her last admission in August with AST 380, ALT 99, AP 538, t bili 2 today. Previously thought secondary to ETOH and though she denies ETOH use for the past 2 years, Ethanol level today was 65. Serologies in  may were all negative,  ferritin WNL. Last Acute hep panel in 2022 was negative. No HE or ascites, no EVs on recent EGD. She has pending RUQ [place] this afternoon. There were Plans for outpatient MRCP for previously noted dilated CBD during her last admission, however, No CBD dilation on most recent CT A/P on 8/23, though small gallstone noted. Will update Acute Hep panel and await RUQ [place], she may benefit from MRCP pending [place] results.  MELD 3.0 is 15.    Plan / Recommendations   Continue famotidine drip Carafate 1g QID Would recommend PPI BID as outpatient (on omeprazole) Avoid all NSAIDs Trend h&h Continue with outpatient cirrhosis care Complete alcohol cessation Await RUQ US--consider MRCP pending [place] results Acute hepatitis panel 10. Consider Repeat EGD if hemoglobin declines or melena does not subside    04/04/2023, 9:28 AM  Reedy Biernat L. Jeanmarie Hubert, MSN, APRN, AGNP-C Adult-Gerontology Nurse Practitioner [facility] at [facility]

## 2023-04-05 ENCOUNTER — Observation Stay (HOSPITAL_COMMUNITY): Payer: Medicaid Other

## 2023-04-05 ENCOUNTER — Other Ambulatory Visit (HOSPITAL_COMMUNITY): Payer: Self-pay | Admitting: *Deleted

## 2023-04-05 DIAGNOSIS — I471 Supraventricular tachycardia, unspecified: Secondary | ICD-10-CM | POA: Diagnosis not present

## 2023-04-05 DIAGNOSIS — E669 Obesity, unspecified: Secondary | ICD-10-CM | POA: Diagnosis not present

## 2023-04-05 DIAGNOSIS — I10 Essential (primary) hypertension: Secondary | ICD-10-CM | POA: Diagnosis not present

## 2023-04-05 DIAGNOSIS — R7401 Elevation of levels of liver transaminase levels: Secondary | ICD-10-CM | POA: Diagnosis not present

## 2023-04-05 DIAGNOSIS — K279 Peptic ulcer, site unspecified, unspecified as acute or chronic, without hemorrhage or perforation: Secondary | ICD-10-CM | POA: Diagnosis not present

## 2023-04-05 DIAGNOSIS — K922 Gastrointestinal hemorrhage, unspecified: Secondary | ICD-10-CM

## 2023-04-05 DIAGNOSIS — K921 Melena: Secondary | ICD-10-CM | POA: Diagnosis not present

## 2023-04-05 DIAGNOSIS — E871 Hypo-osmolality and hyponatremia: Secondary | ICD-10-CM | POA: Diagnosis not present

## 2023-04-05 DIAGNOSIS — K7030 Alcoholic cirrhosis of liver without ascites: Secondary | ICD-10-CM

## 2023-04-05 LAB — COMPREHENSIVE METABOLIC PANEL
ALT: 66 U/L — ABNORMAL HIGH (ref 0–44)
AST: 162 U/L — ABNORMAL HIGH (ref 15–41)
Albumin: 2.7 g/dL — ABNORMAL LOW (ref 3.5–5.0)
Alkaline Phosphatase: 384 U/L — ABNORMAL HIGH (ref 38–126)
Anion gap: 11 (ref 5–15)
BUN: 6 mg/dL (ref 6–20)
CO2: 24 mmol/L (ref 22–32)
Calcium: 8.4 mg/dL — ABNORMAL LOW (ref 8.9–10.3)
Chloride: 100 mmol/L (ref 98–111)
Creatinine, Ser: 0.49 mg/dL (ref 0.44–1.00)
GFR, Estimated: >60 mL/min (ref >60)
Glucose, Bld: 102 mg/dL — ABNORMAL HIGH (ref 70–99)
Potassium: 3.3 mmol/L — ABNORMAL LOW (ref 3.5–5.1)
Sodium: 135 mmol/L (ref 135–145)
Total Bilirubin: 2 mg/dL — ABNORMAL HIGH (ref 0.3–1.2)
Total Protein: 6.4 g/dL — ABNORMAL LOW (ref 6.5–8.1)

## 2023-04-05 LAB — CBC
HCT: 27.5 % — ABNORMAL LOW (ref 36.0–46.0)
Hemoglobin: 8.5 g/dL — ABNORMAL LOW (ref 12.0–15.0)
MCH: 26.6 pg (ref 26.0–34.0)
MCHC: 30.9 g/dL (ref 30.0–36.0)
MCV: 86.2 fL (ref 80.0–100.0)
Platelets: 124 10*3/uL — ABNORMAL LOW (ref 150–400)
RBC: 3.19 MIL/uL — ABNORMAL LOW (ref 3.87–5.11)
RDW: 19.9 % — ABNORMAL HIGH (ref 11.5–15.5)
WBC: 4.7 10*3/uL (ref 4.0–10.5)
nRBC: 0 % (ref 0.0–0.2)

## 2023-04-05 LAB — GLUCOSE, CAPILLARY
Glucose-Capillary: 105 mg/dL — ABNORMAL HIGH (ref 70–99)
Glucose-Capillary: 93 mg/dL (ref 70–99)
Glucose-Capillary: 118 mg/dL — ABNORMAL HIGH (ref 70–99)
Glucose-Capillary: 121 mg/dL — ABNORMAL HIGH (ref 70–99)

## 2023-04-05 LAB — ECHOCARDIOGRAM COMPLETE
Area-P 1/2: 3.12 cm2
S' Lateral: 2.5 cm

## 2023-04-05 LAB — HCV INTERPRETATION

## 2023-04-05 LAB — MAGNESIUM: Magnesium: 1.8 mg/dL (ref 1.7–2.4)

## 2023-04-05 MED ORDER — MAGNESIUM SULFATE 2 GM/50ML IV SOLN
2.0000 g | Freq: Once | INTRAVENOUS | Status: AC
  Administered 2023-04-05: 2 g via INTRAVENOUS
  Filled 2023-04-05: qty 50

## 2023-04-05 MED ORDER — POTASSIUM CHLORIDE CRYS ER 20 MEQ PO TBCR
40.0000 meq | EXTENDED_RELEASE_TABLET | Freq: Once | ORAL | Status: AC
  Administered 2023-04-05: 40 meq via ORAL
  Filled 2023-04-05: qty 2

## 2023-04-05 MED ORDER — FOLIC ACID 1 MG PO TABS
1.0000 mg | ORAL_TABLET | Freq: Every day | ORAL | Status: DC
  Administered 2023-04-05 – 2023-04-06 (×2): 1 mg via ORAL
  Filled 2023-04-05 (×2): qty 1

## 2023-04-05 MED ORDER — PHENOL 1.4 % MT LIQD
1.0000 | OROMUCOSAL | Status: DC | PRN
  Filled 2023-04-05: qty 177

## 2023-04-05 NOTE — Progress Notes (Signed)
*  PRELIMINARY RESULTS* Echocardiogram 2D Echocardiogram has been performed.  Stacey Drain 04/05/2023, 1:30 PM

## 2023-04-05 NOTE — Progress Notes (Signed)
Gastroenterology Progress Note   Referring Provider: No ref. provider found Primary Care Physician:  Richardean Chimera, MD Primary Gastroenterologist:  Dr. Tasia Catchings  Patient ID: Alyssa Carey; [id number]; March 02, 1971   Subjective:    Patient reports two stools today, black. Occurring  postprandially. Feels hungry and wants to advance diet.   Objective:   Vital signs in last 24 hours: Temp:  [98.5 F (36.9 C)-99.4 F (37.4 C)] 98.8 F (37.1 C) (09/19 0828) Pulse Rate:  [101-133] 118 (09/19 0828) Resp:  [16-20] 16 (09/19 0828) BP: (96-141)/(57-86) 106/59 (09/19 0828) SpO2:  [97 %-100 %] 98 % (09/19 0828) Last BM Date : 04/04/23 General:   Alert,  Well-developed, well-nourished, pleasant and cooperative in NAD Head:  Normocephalic and atraumatic. Eyes:  Sclera clear, no icterus.   Abdomen:  Soft, nontender and nondistended. No masses, hepatosplenomegaly or hernias noted. Normal bowel sounds, without guarding, and without rebound.   Extremities:  Without clubbing, deformity or edema. Neurologic:  Alert and  oriented x4;  grossly normal neurologically. Skin:  Intact without significant lesions or rashes. Psych:  Alert and cooperative. Normal mood and affect.  Intake/Output from previous day: 09/18 0701 - 09/19 0700 In: 1443 [P.O.:240; I.V.:1103; IV Piggyback:100] Out: -  Intake/Output this shift: Total I/O In: 240 [P.O.:240] Out: -   Lab Results: CBC Recent Labs    04/04/23 0349 04/05/23 0448  WBC 5.4 4.7  HGB 10.3* 8.5*  HCT 32.4* 27.5*  MCV 84.6 86.2  PLT 192 124*   BMET Recent Labs    04/04/23 0349 04/05/23 0448  NA 131* 135  K 4.1 3.3*  CL 94* 100  CO2 20* 24  GLUCOSE 71 102*  BUN 7 6  CREATININE 0.60 0.49  CALCIUM 8.3* 8.4*   LFTs Recent Labs    04/04/23 0349 04/05/23 0448  BILITOT 2.0* 2.0*  ALKPHOS 538* 384*  AST 380* 162*  ALT 99* 66*  PROT 7.7 6.4*  ALBUMIN 3.2* 2.7*   Recent Labs    04/04/23 0349  LIPASE 24   PT/INR Recent Labs     04/04/23 0349  LABPROT 13.3  INR 1.0         Imaging Studies: US Abdomen Limited RUQ (LIVER/GB)  Result Date: 04/04/2023 CLINICAL DATA:  Transaminitis EXAM: ULTRASOUND ABDOMEN LIMITED RIGHT UPPER QUADRANT COMPARISON:  CT 03/09/2023.  Ultrasound 03/01/2023 FINDINGS: Gallbladder: Mildly distended gallbladder.  No wall thickening or adjacent fluid. Common bile duct: Diameter: 3 mm Liver: Diffusely echogenic hepatic parenchyma consistent with fatty liver infiltration. Portal vein is patent on color Doppler imaging with normal direction of blood flow towards the liver. Other: None. IMPRESSION: No gallstones or ductal dilatation. Electronically Signed   By: Karen Kays M.D.   On: 04/04/2023 15:30   CT ABDOMEN PELVIS W CONTRAST  Result Date: 03/09/2023 CLINICAL DATA:  Lower GI bleed. EXAM: CT ABDOMEN AND PELVIS WITH CONTRAST TECHNIQUE: Multidetector CT imaging of the abdomen and pelvis was performed using the standard protocol following bolus administration of intravenous contrast. RADIATION DOSE REDUCTION: This exam was performed according to the departmental dose-optimization program which includes automated exposure control, adjustment of the mA and/or kV according to patient size and/or use of iterative reconstruction technique. CONTRAST:  OMNIPAQUE IOHEXOL 300 MG/ML  SOLN COMPARISON:  CT abdomen pelvis dated 11/23/2022. FINDINGS: Lower chest: The visualized lung bases are clear. No intra-abdominal free air or free fluid. Hepatobiliary: Fatty liver. Slight irregularity of the liver contour with enlargement of the left lobe of the liver  in keeping with morphologic changes of cirrhosis. No biliary dilatation. Small gallstone. No pericholecystic fluid or evidence of acute cholecystitis by CT. Pancreas: Unremarkable. No pancreatic ductal dilatation or surrounding inflammatory changes. Spleen: Normal in size without focal abnormality. Adrenals/Urinary Tract: The adrenal glands are unremarkable.  Small left renal inferior pole exophytic cyst. There is no hydronephrosis on either side. There is symmetric enhancement and excretion of contrast by both kidneys. The visualized ureters and urinary bladder appear unremarkable. Stomach/Bowel: Postsurgical changes of partial sigmoid resection with anastomotic staple line. There is abutment of several loops of small bowel to the left anterior pelvic wall consistent with adhesions. There is protrusion of a short segment of small bowel into the subcutaneous tissues. There is no bowel obstruction or active inflammation. The appendix is normal. Vascular/Lymphatic: The abdominal aorta and IVC are unremarkable. No portal venous gas. There is no adenopathy. Reproductive: The uterus is anteverted and grossly unremarkable. No adnexal masses. Other: Postsurgical changes of the posterior perineum and perianal region. No fluid collection. Musculoskeletal: No acute osseous pathology. IMPRESSION: 1. No acute intra-abdominal or pelvic pathology. 2. Postsurgical changes of partial sigmoid resection with anastomotic staple line. No bowel obstruction. Normal appendix. 3. Fatty liver with morphologic changes of cirrhosis. 4. Cholelithiasis. Electronically Signed   By: Elgie Collard M.D.   On: 03/09/2023 22:20  [2 weeks]  Assessment:   Alyssa Carey is a 52 y.o. year old female with recent admission last month for GI bleed, found to have PUD on EGD, who presents to the hospital with melena x2 days, FOBT positive. GI consulted for further evaluation   Melena/History of PUD: Hemoglobin actually improved from a few weeks ago, up to 10.3. started on famotidine drip. Recent EGD with findings of non bleeding gastric ulcer with clean ulcer base, Forrest class III. Started on omeprazole 40mg  daily and advised to repeat EGD in 12 weeks (around 11/16). She reports compliance with PPI at home and denies NSAIDs.  Suspected melena from PUD. Still passing black stools but decreased per  patient. She wants to eat.    Elevated Liver enzymes/cirrhosis: LFTs have trended up since her last admission in August with AST 380, ALT 99, AP 538, t bili 2 today. Previously thought secondary to ETOH and though she denies ETOH use for the past 2 years, Ethanol level yesterday was 65. Serologies in May were all negative,  ferritin WNL, iron sats in the 70s. Last Acute hep panel in 2022 was negative. No HE or ascites, no EVs on recent EGD. RUQ U/S gallbladder mildly distended but no wall thickening, CBD 3mm, with no gallstones or ductal dilation. Portal vein patient. There were plans for outpatient MRCP for previously noted dilated CBD during her last admission, however, No CBD dilation on most recent CT A/P on 8/23, though small gallstone noted.  MELD 3.0 is 15.       Plan:   Continue famotidine drip Carafate 1g QID Would recommend PPI BID as outpatient (on omeprazole) Avoid all NSAIDs H/H in AM Continue with outpatient cirrhosis care Complete alcohol cessation  Acute hepatitis panel 10. Consider Repeat EGD if hemoglobin declines or melena does not subside. Advance to full liquid diet.      LOS: 0 days   Leanna Battles. [place] [facility] [phone] 9/19/20241:05 PM

## 2023-04-05 NOTE — Plan of Care (Signed)
Problem: Education: Goal: Knowledge of General Education information will improve Description: Including pain rating scale, medication(s)/side effects and non-pharmacologic comfort measures Outcome: Progressing   Problem: Nutrition: Goal: Adequate nutrition will be maintained Outcome: Progressing

## 2023-04-05 NOTE — Progress Notes (Signed)
   04/04/23 2218  Assess: MEWS Score  Temp 99.4 F (37.4 C)  BP 96/60  MAP (mmHg) 71  Pulse Rate (!) 109  Resp 20  SpO2 100 %  O2 Device Room Air  Assess: MEWS Score  MEWS Temp 0  MEWS Systolic 1  MEWS Pulse 1  MEWS RR 0  MEWS LOC 0  MEWS Score 2  MEWS Score Color Yellow  Assess: if the MEWS score is Yellow or Red  Were vital signs accurate and taken at a resting state? Yes  Does the patient meet 2 or more of the SIRS criteria? No  MEWS guidelines implemented  No, previously yellow, continue vital signs every 4 hours  Assess: SIRS CRITERIA  SIRS Temperature  0  SIRS Pulse 1  SIRS Respirations  0  SIRS WBC 0  SIRS Score Sum  1

## 2023-04-05 NOTE — Plan of Care (Signed)
  Problem: Education: Goal: Knowledge of General Education information will improve Description Including pain rating scale, medication(s)/side effects and non-pharmacologic comfort measures Outcome: Progressing   Problem: Health Behavior/Discharge Planning: Goal: Ability to manage health-related needs will improve Outcome: Progressing   

## 2023-04-05 NOTE — TOC CM/SW Note (Signed)
Transition of Care [facility]) - Inpatient Brief Assessment   Patient Details  Name: Alyssa Carey MRN: [id number] Date of Birth: [birth year]  Transition of Care [facility]) CM/SW Contact:    Villa Herb, LCSWA Phone Number: 04/05/2023, 12:00 PM   Clinical Narrative: Transition of Care Department [facility]) has reviewed patient and no TOC needs have been identified at this time. We will continue to monitor patient advancement through interdisciplinary progression rounds. If new patient transition needs arise, please place a TOC consult.  Transition of Care Asessment: Insurance and Status: Insurance coverage has been reviewed Patient has primary care physician: Yes Home environment has been reviewed: from home Prior level of function:: independent Prior/Current Home Services: No current home services Social Determinants of Health Reivew: SDOH reviewed no interventions necessary Readmission risk has been reviewed: Yes Transition of care needs: no transition of care needs at this time

## 2023-04-05 NOTE — Progress Notes (Addendum)
PROGRESS NOTE  Alyssa Carey MVH:[id number] DOB: [birth year] DOA: 04/04/2023 PCP: Richardean Chimera, MD  Brief History:  52 year old female with a history of necrotizing fasciitis s/p diverting colostomy June 2021 complicated by parastomal hernia, SBO Jan/February 2023, previous GI bleed due to PUD, July 2023 underwent lap assisted colostomy takedown and partial colectomy, alcohol use disorder in remission, compensated cirrhosis, recent hospitalization and discharged on 01/19/2023 following upper GI bleed secondary to PUD, presents 2 days of melanotic stools and 1 week of generalized weakness and decreased oral intake.  Notably, the patient had a recent hospital admission from 03/09/2023 to 03/10/2023 secondary to GI bleed.  She underwent EGD and colonoscopy the results of which are noted below.  She was discharged home with omeprazole twice daily.  She has an intolerance to pantoprazole.  She denies any alcohol or illicit drug use.  She has never smoked.  She denies any NSAIDs.  She continues to have loose stools without any hematochezia but complains of some melanotic stool.  She denies any abdominal pain or hematemesis.  She has not had fevers, chills, chest pain, shortness breath, coughing, hemoptysis. In the ED, the patient was afebrile hemodynamically stable with oxygen saturation 99% room air.  WBC 5.4, hemoglobin 10.3, platelets 192,000.  Sodium 131, potassium 4.1, bicarbonate 20, serum creatinine 0.60.  FOBT was positive.  GI was consulted to assist with management.  Notably, the patient had a hospital admission from 01/16/2023 to 01/19/2023 for acute anemia.  She underwent EGD on 01/17/2023 which showed prepyloric and gastric ulcers.  03/10/2023 EGD and colon:   EGD - Normal esophagus. - Z- line regular. - Non- bleeding gastric ulcer with a clean ulcer base ( Forrest Class III) . Biopsied. - Normal duodenal bulb and second portion of the duodenum.   Colonoscopy:   The examined portion of the  ileum was normal.  - A few erosions in the sigmoid colon. Biopsied.  - Diverticulosis in the left colon. There was no evidence of diverticular bleeding.  - Erythematous mucosa in the rectum. Biopsied.  - One 3 mm polyp in the descending colon, removed with a cold biopsy forceps. Resected and retrieved.  - Non- bleeding external and internal hemorrhoids.  - Sacral decubitus stage 2 or stage 3 with clear discharge   Assessment/Plan: Melanotic stool -Patient has intolerance to pantoprazole -on pepcid drip -GI consult appreciated>>add sulcrafate but pt refusing it -Restart omeprazole when okay with GI -Clear liquid diet>>full liquid -Hemoglobin stable since discharge 03/09/23 -no documented stools by nursing staff   Generalized weakness -Obtain UA--no pyuria -UDS--neg -B12--543 -TSH--2.604 -folate--6.1 -PT evaluation -Folic acid>>replete  Alcohol abuse -likely contributing to GI bleed -pt denies Etoh use but Etoh level 65 and AST>ALT -would check Etoh level on each subsequent admission   Transaminasemia -due to Etoh use -GI consulted -03/09/2023 CT abdomen negative for acute findings; cholelithiasis with fatty liver -RUQ ultrasound--CBD 3mm, mild GB distension, no wall thickening -trending down -no abd pain  Atrial tachycardia/SVT -started metoprolol 12.5 mg bid -Echo-- EF 60-65%, no WMA, normal RVF   Hyponatremia -Secondary to liver cirrhosis -Clinically euvolemic -Appears to be chronic since April 2024   Stage II sacral decubitus ulcer -Ulcer is clean without signs of infection on examination -Wound care consult appreciated>>silvadene -Pictures taken   Loose stools -Check C. Difficile--no BM to collect -Stool pathogen panel--no BM to collect   Alcoholic liver cirrhosis -Appears clinically compensated  silver sulfADIAZINE   Topical BID   sucralfate  1 g Oral TID WC & HS   Continuous Infusions:  sodium chloride 100 mL/hr at 04/05/23 0415   famotidine (PEPCID) IV 20 mg (04/05/23 1040)    Procedures/Studies: ECHOCARDIOGRAM COMPLETE  Result Date: 04/05/2023    ECHOCARDIOGRAM REPORT   Patient Name:   Alyssa Carey Date of Exam: 04/05/2023 Medical Rec #:  [id number]       Height:       57.0 in Accession #:    [id number]      Weight:       181.6 lb Date of Birth:  1971-04-08       BSA:          1.726 m Patient Age:    51 years        BP:           106/59 mmHg Patient Gender: F               HR:           100 bpm. [facility] Procedure: 2D Echo, Cardiac Doppler and Color Doppler Indications:    SVT (supraventricular tachycardia) [202906]  History:        Patient has prior history of Echocardiogram examinations,  most                 recent 08/14/2021. Arrythmias:Atrial Flutter and Paroxysmal SVT;                 Risk Factors:Hypertension. Alcohol use and withdrawal.  Sonographer:    Celesta Gentile RCS Referring Phys: [phone] Avaeh Ewer IMPRESSIONS  1. Left ventricular ejection fraction, by estimation, is 60 to 65%. The left ventricle has normal function. The left ventricle has no regional wall motion abnormalities. Left ventricular diastolic parameters were normal.  2. Right ventricular systolic function is normal. The right ventricular size is normal. Tricuspid regurgitation signal is inadequate for assessing PA pressure.  3. The mitral valve is grossly normal. No evidence of mitral valve regurgitation.  4. The aortic valve is tricuspid. Aortic valve regurgitation is not visualized.  5. The inferior vena cava is normal in size with greater than 50% respiratory variability, suggesting right atrial pressure of 3 mmHg.  6. Cannot exclude a small PFO. Comparison(s): Prior images reviewed side by side. LVEF normal range at 60-65%. FINDINGS  Left Ventricle: Left ventricular ejection fraction, by estimation, is 60 to 65%. The left ventricle has normal function. The left ventricle has no regional wall motion abnormalities. The left ventricular internal cavity size was normal in size. There is  no left ventricular hypertrophy. Left ventricular diastolic parameters were normal. Right Ventricle: The right ventricular size is normal. No increase in right ventricular wall thickness. Right ventricular systolic function is normal. Tricuspid regurgitation signal is inadequate for assessing PA pressure. Left Atrium: Left atrial size was normal in size. Right Atrium: Right atrial size was normal in size. Pericardium: Trivial pericardial effusion is present. The pericardial effusion is posterior to the left ventricle. Mitral Valve: The mitral valve is grossly normal. No evidence of mitral valve regurgitation. Tricuspid Valve: The tricuspid valve  is grossly normal. Tricuspid valve regurgitation is trivial. Aortic Valve: The aortic valve is tricuspid. Aortic valve regurgitation is not visualized. Pulmonic Valve: The pulmonic valve was grossly normal. Pulmonic valve regurgitation is trivial. Aorta: The aortic root is normal in size and structure. Venous: The inferior vena cava is normal in size with greater than  silver sulfADIAZINE   Topical BID   sucralfate  1 g Oral TID WC & HS   Continuous Infusions:  sodium chloride 100 mL/hr at 04/05/23 0415   famotidine (PEPCID) IV 20 mg (04/05/23 1040)    Procedures/Studies: ECHOCARDIOGRAM COMPLETE  Result Date: 04/05/2023    ECHOCARDIOGRAM REPORT   Patient Name:   Alyssa Carey Date of Exam: 04/05/2023 Medical Rec #:  [id number]       Height:       57.0 in Accession #:    [id number]      Weight:       181.6 lb Date of Birth:  1971-04-08       BSA:          1.726 m Patient Age:    51 years        BP:           106/59 mmHg Patient Gender: F               HR:           100 bpm. [facility] Procedure: 2D Echo, Cardiac Doppler and Color Doppler Indications:    SVT (supraventricular tachycardia) [202906]  History:        Patient has prior history of Echocardiogram examinations,  most                 recent 08/14/2021. Arrythmias:Atrial Flutter and Paroxysmal SVT;                 Risk Factors:Hypertension. Alcohol use and withdrawal.  Sonographer:    Celesta Gentile RCS Referring Phys: [phone] Avaeh Ewer IMPRESSIONS  1. Left ventricular ejection fraction, by estimation, is 60 to 65%. The left ventricle has normal function. The left ventricle has no regional wall motion abnormalities. Left ventricular diastolic parameters were normal.  2. Right ventricular systolic function is normal. The right ventricular size is normal. Tricuspid regurgitation signal is inadequate for assessing PA pressure.  3. The mitral valve is grossly normal. No evidence of mitral valve regurgitation.  4. The aortic valve is tricuspid. Aortic valve regurgitation is not visualized.  5. The inferior vena cava is normal in size with greater than 50% respiratory variability, suggesting right atrial pressure of 3 mmHg.  6. Cannot exclude a small PFO. Comparison(s): Prior images reviewed side by side. LVEF normal range at 60-65%. FINDINGS  Left Ventricle: Left ventricular ejection fraction, by estimation, is 60 to 65%. The left ventricle has normal function. The left ventricle has no regional wall motion abnormalities. The left ventricular internal cavity size was normal in size. There is  no left ventricular hypertrophy. Left ventricular diastolic parameters were normal. Right Ventricle: The right ventricular size is normal. No increase in right ventricular wall thickness. Right ventricular systolic function is normal. Tricuspid regurgitation signal is inadequate for assessing PA pressure. Left Atrium: Left atrial size was normal in size. Right Atrium: Right atrial size was normal in size. Pericardium: Trivial pericardial effusion is present. The pericardial effusion is posterior to the left ventricle. Mitral Valve: The mitral valve is grossly normal. No evidence of mitral valve regurgitation. Tricuspid Valve: The tricuspid valve  is grossly normal. Tricuspid valve regurgitation is trivial. Aortic Valve: The aortic valve is tricuspid. Aortic valve regurgitation is not visualized. Pulmonic Valve: The pulmonic valve was grossly normal. Pulmonic valve regurgitation is trivial. Aorta: The aortic root is normal in size and structure. Venous: The inferior vena cava is normal in size with greater than  silver sulfADIAZINE   Topical BID   sucralfate  1 g Oral TID WC & HS   Continuous Infusions:  sodium chloride 100 mL/hr at 04/05/23 0415   famotidine (PEPCID) IV 20 mg (04/05/23 1040)    Procedures/Studies: ECHOCARDIOGRAM COMPLETE  Result Date: 04/05/2023    ECHOCARDIOGRAM REPORT   Patient Name:   Alyssa Carey Date of Exam: 04/05/2023 Medical Rec #:  [id number]       Height:       57.0 in Accession #:    [id number]      Weight:       181.6 lb Date of Birth:  1971-04-08       BSA:          1.726 m Patient Age:    51 years        BP:           106/59 mmHg Patient Gender: F               HR:           100 bpm. [facility] Procedure: 2D Echo, Cardiac Doppler and Color Doppler Indications:    SVT (supraventricular tachycardia) [202906]  History:        Patient has prior history of Echocardiogram examinations,  most                 recent 08/14/2021. Arrythmias:Atrial Flutter and Paroxysmal SVT;                 Risk Factors:Hypertension. Alcohol use and withdrawal.  Sonographer:    Celesta Gentile RCS Referring Phys: [phone] Avaeh Ewer IMPRESSIONS  1. Left ventricular ejection fraction, by estimation, is 60 to 65%. The left ventricle has normal function. The left ventricle has no regional wall motion abnormalities. Left ventricular diastolic parameters were normal.  2. Right ventricular systolic function is normal. The right ventricular size is normal. Tricuspid regurgitation signal is inadequate for assessing PA pressure.  3. The mitral valve is grossly normal. No evidence of mitral valve regurgitation.  4. The aortic valve is tricuspid. Aortic valve regurgitation is not visualized.  5. The inferior vena cava is normal in size with greater than 50% respiratory variability, suggesting right atrial pressure of 3 mmHg.  6. Cannot exclude a small PFO. Comparison(s): Prior images reviewed side by side. LVEF normal range at 60-65%. FINDINGS  Left Ventricle: Left ventricular ejection fraction, by estimation, is 60 to 65%. The left ventricle has normal function. The left ventricle has no regional wall motion abnormalities. The left ventricular internal cavity size was normal in size. There is  no left ventricular hypertrophy. Left ventricular diastolic parameters were normal. Right Ventricle: The right ventricular size is normal. No increase in right ventricular wall thickness. Right ventricular systolic function is normal. Tricuspid regurgitation signal is inadequate for assessing PA pressure. Left Atrium: Left atrial size was normal in size. Right Atrium: Right atrial size was normal in size. Pericardium: Trivial pericardial effusion is present. The pericardial effusion is posterior to the left ventricle. Mitral Valve: The mitral valve is grossly normal. No evidence of mitral valve regurgitation. Tricuspid Valve: The tricuspid valve  is grossly normal. Tricuspid valve regurgitation is trivial. Aortic Valve: The aortic valve is tricuspid. Aortic valve regurgitation is not visualized. Pulmonic Valve: The pulmonic valve was grossly normal. Pulmonic valve regurgitation is trivial. Aorta: The aortic root is normal in size and structure. Venous: The inferior vena cava is normal in size with greater than  PROGRESS NOTE  Alyssa Carey MVH:[id number] DOB: [birth year] DOA: 04/04/2023 PCP: Richardean Chimera, MD  Brief History:  52 year old female with a history of necrotizing fasciitis s/p diverting colostomy June 2021 complicated by parastomal hernia, SBO Jan/February 2023, previous GI bleed due to PUD, July 2023 underwent lap assisted colostomy takedown and partial colectomy, alcohol use disorder in remission, compensated cirrhosis, recent hospitalization and discharged on 01/19/2023 following upper GI bleed secondary to PUD, presents 2 days of melanotic stools and 1 week of generalized weakness and decreased oral intake.  Notably, the patient had a recent hospital admission from 03/09/2023 to 03/10/2023 secondary to GI bleed.  She underwent EGD and colonoscopy the results of which are noted below.  She was discharged home with omeprazole twice daily.  She has an intolerance to pantoprazole.  She denies any alcohol or illicit drug use.  She has never smoked.  She denies any NSAIDs.  She continues to have loose stools without any hematochezia but complains of some melanotic stool.  She denies any abdominal pain or hematemesis.  She has not had fevers, chills, chest pain, shortness breath, coughing, hemoptysis. In the ED, the patient was afebrile hemodynamically stable with oxygen saturation 99% room air.  WBC 5.4, hemoglobin 10.3, platelets 192,000.  Sodium 131, potassium 4.1, bicarbonate 20, serum creatinine 0.60.  FOBT was positive.  GI was consulted to assist with management.  Notably, the patient had a hospital admission from 01/16/2023 to 01/19/2023 for acute anemia.  She underwent EGD on 01/17/2023 which showed prepyloric and gastric ulcers.  03/10/2023 EGD and colon:   EGD - Normal esophagus. - Z- line regular. - Non- bleeding gastric ulcer with a clean ulcer base ( Forrest Class III) . Biopsied. - Normal duodenal bulb and second portion of the duodenum.   Colonoscopy:   The examined portion of the  ileum was normal.  - A few erosions in the sigmoid colon. Biopsied.  - Diverticulosis in the left colon. There was no evidence of diverticular bleeding.  - Erythematous mucosa in the rectum. Biopsied.  - One 3 mm polyp in the descending colon, removed with a cold biopsy forceps. Resected and retrieved.  - Non- bleeding external and internal hemorrhoids.  - Sacral decubitus stage 2 or stage 3 with clear discharge   Assessment/Plan: Melanotic stool -Patient has intolerance to pantoprazole -on pepcid drip -GI consult appreciated>>add sulcrafate but pt refusing it -Restart omeprazole when okay with GI -Clear liquid diet>>full liquid -Hemoglobin stable since discharge 03/09/23 -no documented stools by nursing staff   Generalized weakness -Obtain UA--no pyuria -UDS--neg -B12--543 -TSH--2.604 -folate--6.1 -PT evaluation -Folic acid>>replete  Alcohol abuse -likely contributing to GI bleed -pt denies Etoh use but Etoh level 65 and AST>ALT -would check Etoh level on each subsequent admission   Transaminasemia -due to Etoh use -GI consulted -03/09/2023 CT abdomen negative for acute findings; cholelithiasis with fatty liver -RUQ ultrasound--CBD 3mm, mild GB distension, no wall thickening -trending down -no abd pain  Atrial tachycardia/SVT -started metoprolol 12.5 mg bid -Echo-- EF 60-65%, no WMA, normal RVF   Hyponatremia -Secondary to liver cirrhosis -Clinically euvolemic -Appears to be chronic since April 2024   Stage II sacral decubitus ulcer -Ulcer is clean without signs of infection on examination -Wound care consult appreciated>>silvadene -Pictures taken   Loose stools -Check C. Difficile--no BM to collect -Stool pathogen panel--no BM to collect   Alcoholic liver cirrhosis -Appears clinically compensated

## 2023-04-06 ENCOUNTER — Ambulatory Visit (HOSPITAL_BASED_OUTPATIENT_CLINIC_OR_DEPARTMENT_OTHER): Payer: 59 | Admitting: General Surgery

## 2023-04-06 ENCOUNTER — Telehealth: Payer: Self-pay | Admitting: Gastroenterology

## 2023-04-06 DIAGNOSIS — E871 Hypo-osmolality and hyponatremia: Secondary | ICD-10-CM | POA: Diagnosis not present

## 2023-04-06 DIAGNOSIS — R7401 Elevation of levels of liver transaminase levels: Secondary | ICD-10-CM | POA: Diagnosis not present

## 2023-04-06 DIAGNOSIS — E669 Obesity, unspecified: Secondary | ICD-10-CM | POA: Diagnosis not present

## 2023-04-06 DIAGNOSIS — K921 Melena: Secondary | ICD-10-CM | POA: Diagnosis not present

## 2023-04-06 DIAGNOSIS — K922 Gastrointestinal hemorrhage, unspecified: Secondary | ICD-10-CM | POA: Diagnosis not present

## 2023-04-06 LAB — CBC
HCT: 28.4 % — ABNORMAL LOW (ref 36.0–46.0)
Hemoglobin: 8.8 g/dL — ABNORMAL LOW (ref 12.0–15.0)
MCH: 27.2 pg (ref 26.0–34.0)
MCHC: 31 g/dL (ref 30.0–36.0)
MCV: 87.9 fL (ref 80.0–100.0)
Platelets: 127 10*3/uL — ABNORMAL LOW (ref 150–400)
RBC: 3.23 MIL/uL — ABNORMAL LOW (ref 3.87–5.11)
RDW: 20.3 % — ABNORMAL HIGH (ref 11.5–15.5)
WBC: 5.6 10*3/uL (ref 4.0–10.5)
nRBC: 0 % (ref 0.0–0.2)

## 2023-04-06 LAB — BASIC METABOLIC PANEL
Anion gap: 10 (ref 5–15)
BUN: 7 mg/dL (ref 6–20)
CO2: 24 mmol/L (ref 22–32)
Calcium: 8.7 mg/dL — ABNORMAL LOW (ref 8.9–10.3)
Chloride: 102 mmol/L (ref 98–111)
Creatinine, Ser: 0.46 mg/dL (ref 0.44–1.00)
GFR, Estimated: >60 mL/min (ref >60)
Glucose, Bld: 100 mg/dL — ABNORMAL HIGH (ref 70–99)
Potassium: 3.6 mmol/L (ref 3.5–5.1)
Sodium: 136 mmol/L (ref 135–145)

## 2023-04-06 LAB — MAGNESIUM: Magnesium: 1.9 mg/dL (ref 1.7–2.4)

## 2023-04-06 MED ORDER — SUCRALFATE 1 GM/10ML PO SUSP: 1.0000 g | Freq: Three times a day (TID) | ORAL | 2 refills | Status: DC

## 2023-04-06 MED ORDER — METOPROLOL TARTRATE 25 MG PO TABS: 12.5000 mg | ORAL_TABLET | Freq: Two times a day (BID) | ORAL | 1 refills | Status: DC

## 2023-04-06 MED ORDER — ORAL CARE MOUTH RINSE: 15.0000 mL | OROMUCOSAL | Status: DC | PRN

## 2023-04-06 MED ORDER — OMEPRAZOLE 40 MG PO CPDR: 40.0000 mg | DELAYED_RELEASE_CAPSULE | Freq: Two times a day (BID) | ORAL | 1 refills | Status: DC

## 2023-04-06 MED ORDER — FOLIC ACID 1 MG PO TABS: 1.0000 mg | ORAL_TABLET | Freq: Every day | ORAL | Status: DC

## 2023-04-06 MED ORDER — CHOLESTYRAMINE 4 G PO PACK: 4.0000 g | PACK | Freq: Every day | ORAL | 1 refills | Status: DC | PRN

## 2023-04-06 NOTE — TOC Initial Note (Signed)
Transition of Care [facility]) - Initial/Assessment Note    Patient Details  Name: Alyssa Carey MRN: [id number] Date of Birth: 03-08-1971  [facility] of [facility]) CM/SW Contact:    Villa Herb, LCSWA Phone Number: 04/06/2023, 11:07 AM  Clinical Narrative:                 Pt high risk for readmission. CSW spoke with pt at bedside to complete assessment. Pts son lives with her. Pt is independent in completing ADLs and provides her own transportation. Pt does not use any DME. TOC to follow.   Expected Discharge Plan: Home/Self Care Barriers to Discharge: No Barriers Identified   Patient Goals and CMS Choice Patient states their goals for this hospitalization and ongoing recovery are:: from home CMS Medicare.gov Compare Post Acute Care list provided to:: Patient Choice offered to / list presented to : Patient      Expected Discharge Plan and Services In-house Referral: Clinical Social Work Discharge Planning Services: CM Consult   Living arrangements for the past 2 months: Single Family Home                                      Prior Living Arrangements/Services Living arrangements for the past 2 months: Single Family Home Lives with:: Self, Adult Children Patient language and need for interpreter reviewed:: Yes Do you feel safe going back to the place where you live?: Yes      Need for Family Participation in Patient Care: Yes (Comment) Care giver support system in place?: Yes (comment)   Criminal Activity/Legal Involvement Pertinent to Current Situation/Hospitalization: No - Comment as needed  Activities of Daily Living Home Assistive Devices/Equipment: None ADL Screening (condition at time of admission) Patient's cognitive ability adequate to safely complete daily activities?: Yes Is the patient deaf or have difficulty hearing?: No Does the patient have difficulty seeing, even when wearing glasses/contacts?: No Does the patient have difficulty concentrating,  remembering, or making decisions?: No Patient able to express need for assistance with ADLs?: No Does the patient have difficulty dressing or bathing?: No Independently performs ADLs?: Yes (appropriate for developmental age) Does the patient have difficulty walking or climbing stairs?: Yes (climbing stairs) Weakness of Legs: Both Weakness of Arms/Hands: None  Permission Sought/Granted                  Emotional Assessment Appearance:: Appears stated age Attitude/Demeanor/Rapport: Engaged Affect (typically observed): Accepting Orientation: : Oriented to Self, Oriented to Place, Oriented to  Time, Oriented to Situation Alcohol / Substance Use: Not Applicable Psych Involvement: No (comment)  Admission diagnosis:  Upper GI bleeding [K92.2] GI bleed [K92.2] Skin ulcer of sacrum, unspecified ulcer stage (HCC) [L98.429] Melena [K92.1] Patient Active Problem List   Diagnosis Date Noted   GI bleed 04/04/2023   Melena 04/04/2023   Skin ulcer of sacrum (HCC) 04/04/2023   Common bile duct dilation 03/10/2023   Grade II hemorrhoids 03/10/2023   Diverticulosis of colon without hemorrhage 03/10/2023   Noninfectious gastroenteritis 03/10/2023   Gastritis and gastroduodenitis 03/10/2023   History of colostomy reversal 03/09/2023   Blood in stool 01/16/2023   Nausea & vomiting 01/16/2023   Abnormal liver function 01/16/2023   Protein calorie malnutrition (HCC) 01/16/2023   Chronic diarrhea 12/05/2022   Parainfluenza infection 11/13/2022   Bronchiolitis 11/13/2022   Alcohol abuse 11/13/2022   Reactive airway disease 11/13/2022   Hyponatremia 11/10/2022  Obesity, Class III, BMI 40-49.9 (morbid obesity) (HCC) 01/29/2022   Alcohol withdrawal (HCC) 01/29/2022   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   Diarrhea with dehydration 09/03/2021   Hypomagnesemia 09/02/2021   Paroxysmal SVT (supraventricular tachycardia) 08/24/2021   Atrial flutter (HCC)    SBO (small bowel obstruction) (HCC)     Malnutrition of moderate degree 08/17/2021   Dehydration 08/10/2021   Swelling of lower extremity 04/21/2021   Normocytic anemia 04/09/2021   Hypokalemia 03/24/2021   Hypoalbuminemia due to protein-calorie malnutrition (HCC) 03/24/2021   Hypoglycemia 03/24/2021   Obesity (BMI 30-39.9) 03/24/2021   Serum total bilirubin elevated 03/24/2021   Anemia 03/24/2021   Prolonged QT interval 03/24/2021   Acute hepatitis    Alcohol use    Abnormal liver enzymes 03/23/2021   Peptic ulcer disease 12/02/2020   Acute blood loss anemia 10/06/2020   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CC)    Elevated alkaline phosphatase level    Abdominal pain    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    Gastrointestinal hemorrhage 10/05/2020   Transaminitis 10/05/2020   HTN (hypertension) 10/05/2020   Mild protein malnutrition (HCC) 10/05/2020   Stercoral ulcer of rectum 01/12/2020   Aphonia 01/12/2020   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   Vocal fold paresis, 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   Hypernatremia 01/12/2020   Anasarca 01/12/2020   Necrotizing fasciitis of pelvic region and thigh (HCC) 12/28/2019   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   Acute respiratory failure with hypoxemia (HCC)    Alcoholic hepatitis without ascites    Pressure injury of skin 12/22/2019   Fall at home, initial encounter 12/21/2019   Weakness 12/21/2019   Alcoholism (HCC) 12/21/2019   Thrombocytopenia (HCC) 12/21/2019   ARF (acute renal failure) (HCC) 12/21/2019   Hepatic encephalopathy 12/21/2019   Alcoholic liver disease (HCC) 12/21/2019   PCP:  Richardean Chimera, MD Pharmacy:   [facility] [address] [place] - [address] [place] [address] Phone: [phone] Fax: [phone]     Social Determinants of Health (SDOH) Social History: SDOH Screenings   Food Insecurity: No Food Insecurity (04/04/2023)  Housing: Low Risk  (04/04/2023)  Transportation Needs: No Transportation Needs (04/04/2023)   Utilities: Not At Risk (04/04/2023)  Financial Resource Strain: Low Risk  (09/30/2019)   Received from [facility], [facility]  Stress: No Stress Concern Present (09/30/2019)   Received from [facility], [facility]  Tobacco Use: Low Risk  (03/10/2023)  Health Literacy: Low Risk  (09/05/2021)   Received from [facility], [facility]   SDOH Interventions:     Readmission Risk Interventions    04/06/2023   11:04 AM 02/01/2022    2:28 PM 03/24/2021   12:50 PM  Readmission Risk Prevention Plan  Transportation Screening Complete Complete Complete  PCP or Specialist Appt within 5-7 Days  Complete   Home Care Screening  Complete   Medication Review (RN CM)  Complete   HRI or Home Care Consult Complete  Complete  Social Work Consult for Recovery Care Planning/Counseling Complete  Complete  Palliative Care Screening Not Applicable  Not Applicable  Medication Review Oceanographer) Complete  Complete

## 2023-04-06 NOTE — Telephone Encounter (Signed)
Alyssa Carey - patient needs hospital follow-up in 2-3 weeks with Tobi Bastos to discuss her melena and need to schedule MRI  Toni Amend -please arrange a CBC and CMP in 1 week  Brooke Bonito, MSN, APRN, FNP-BC, AGACNP-BC [facility] at [employer]

## 2023-04-06 NOTE — Discharge Summary (Addendum)
Physician Discharge Summary   Patient: Alyssa Carey MRN: [id number] DOB: 1971/04/20  Admit date:     04/04/2023  Discharge date: 04/06/23  Discharge Physician: Onalee Hua Deyra Perdomo   PCP: Richardean Chimera, MD   Recommendations at discharge:   Please follow up with primary care provider within 1-2 weeks  Please repeat BMP and CBC in one week   Hospital Course: 52 year old female with a history of necrotizing fasciitis s/p diverting colostomy June 2021 complicated by parastomal hernia, SBO Jan/February 2023, previous GI bleed due to PUD, July 2023 underwent lap assisted colostomy takedown and partial colectomy, alcohol use disorder in remission, compensated cirrhosis, recent hospitalization and discharged on 01/19/2023 following upper GI bleed secondary to PUD, presents 2 days of melanotic stools and 1 week of generalized weakness and decreased oral intake.  Notably, the patient had a recent hospital admission from 03/09/2023 to 03/10/2023 secondary to GI bleed.  She underwent EGD and colonoscopy the results of which are noted below.  She was discharged home with omeprazole twice daily.  She has an intolerance to pantoprazole.  She denies any alcohol or illicit drug use.  She has never smoked.  She denies any NSAIDs.  She continues to have loose stools without any hematochezia but complains of some melanotic stool.  She denies any abdominal pain or hematemesis.  She has not had fevers, chills, chest pain, shortness breath, coughing, hemoptysis. In the ED, the patient was afebrile hemodynamically stable with oxygen saturation 99% room air.  WBC 5.4, hemoglobin 10.3, platelets 192,000.  Sodium 131, potassium 4.1, bicarbonate 20, serum creatinine 0.60.  FOBT was positive.  GI was consulted to assist with management.  Notably, the patient had a hospital admission from 01/16/2023 to 01/19/2023 for acute anemia.  She underwent EGD on 01/17/2023 which showed prepyloric and gastric ulcers.  03/10/2023 EGD and colon:   EGD -  Normal esophagus. - Z- line regular. - Non- bleeding gastric ulcer with a clean ulcer base ( Forrest Class III) . Biopsied. - Normal duodenal bulb and second portion of the duodenum.   Colonoscopy:   The examined portion of the ileum was normal.  - A few erosions in the sigmoid colon. Biopsied.  - Diverticulosis in the left colon. There was no evidence of diverticular bleeding.  - Erythematous mucosa in the rectum. Biopsied.  - One 3 mm polyp in the descending colon, removed with a cold biopsy forceps. Resected and retrieved.  - Non- bleeding external and internal hemorrhoids.  - Sacral decubitus stage 2 or stage 3 with clear discharge  Assessment and Plan:  Melanotic stool -Patient has intolerance to pantoprazole -on pepcid drip during hospitalization -GI consult appreciated>>add sulcrafate but pt refusing it -Restart omeprazole when okay with GI -Clear liquid diet>>full liquid>>2 g Na diet which pt tolerated -Hemoglobin stable since discharge 03/09/23 -no documented stools by nursing staff -9/20--discussed with GI>>ok to d/c home with outpt followup -Hgb stable at 8.8 at time of d/c   Generalized weakness -Obtain UA--no pyuria -UDS--neg -B12--543 -TSH--2.604 -folate--6.1 -Folic acid 6.1>>replete -improved--able to ambulate without assist   Alcohol abuse -likely contributing to GI bleed -pt denies Etoh use but Etoh level 65 and AST>ALT -would check Etoh level on each subsequent admission -no signs of withdrawal   Transaminasemia -due to Etoh use -alcohol level 65 at time of admission -GI consulted -03/09/2023 CT abdomen negative for acute findings; cholelithiasis with fatty liver -RUQ ultrasound--CBD 3mm, mild GB distension, no wall thickening -trending down -no abd pain; tolerated diet  Atrial tachycardia/SVT -started metoprolol 12.5 mg bid -Echo-- EF 60-65%, no WMA, normal RVF -improved with metoprolol   Hyponatremia -Secondary to liver cirrhosis and volume  depletion -Clinically euvolemic -Appears to be chronic since April 2024 -improved with IVF--Na 136 on day of dc   Stage II sacral decubitus ulcer -Ulcer is clean without signs of infection on examination -Wound care consult appreciated>>silvadene -Pictures taken   Loose stools -Check C. Difficile--no BM to collect -Stool pathogen panel--no BM to collect -cholestyramine 4 grams daily prn loose stool   Alcoholic liver cirrhosis -Appears clinically compensated              Consultants: GI Procedures performed: none  Disposition: Home Diet recommendation:  2 gram sodium DISCHARGE MEDICATION: Allergies as of 04/06/2023       Reactions   Pantoprazole    Stomach pain   Penicillins Hives   Oxycodone Hcl Rash        Medication List     TAKE these medications    cholestyramine 4 g packet Commonly known as: QUESTRAN Take 1 packet (4 g total) by mouth daily as needed (diarrhea).   fluconazole 150 MG tablet Commonly known as: DIFLUCAN Take 150 mg by mouth every 3 (three) days.   fluticasone 50 MCG/ACT nasal spray Commonly known as: FLONASE Place 2 sprays into both nostrils as needed for allergies or rhinitis.   folic acid 1 MG tablet Commonly known as: FOLVITE Take 1 tablet (1 mg total) by mouth daily. Start taking on: April 07, 2023   gabapentin 100 MG capsule Commonly known as: NEURONTIN Take 1 capsule (100 mg total) by mouth at bedtime as needed (pain).   Gerhardt's butt cream Crea Apply 1 Application topically 4 (four) times daily as needed for irritation.   hydrocortisone 2.5 % rectal cream Commonly known as: ANUSOL-HC Place 1 Application rectally 2 (two) times daily. What changed:  when to take this reasons to take this   hydrOXYzine 25 MG tablet Commonly known as: ATARAX Take 25 mg by mouth as needed for itching.   loratadine 10 MG tablet Commonly known as: CLARITIN Take 1 tablet (10 mg total) by mouth daily. What changed:  when to  take this reasons to take this   metoprolol tartrate 25 MG tablet Commonly known as: LOPRESSOR Take 0.5 tablets (12.5 mg total) by mouth 2 (two) times daily.   montelukast 10 MG tablet Commonly known as: SINGULAIR Take 10 mg by mouth at bedtime.   omeprazole 40 MG capsule Commonly known as: PRILOSEC Take 1 capsule (40 mg total) by mouth 2 (two) times daily.   sucralfate 1 GM/10ML suspension Commonly known as: CARAFATE Take 10 mLs (1 g total) by mouth 4 (four) times daily -  with meals and at bedtime.        Discharge Exam: HEENT:  /AT, No thrush, no icterus CV:  RRR, no rub, no S3, no S4 Lung:  CTA, no wheeze, no rhonchi Abd:  soft/+BS, NT Ext:  No edema, no lymphangitis, no synovitis, no rash   Condition at discharge: stable  The results of significant diagnostics from this hospitalization (including imaging, microbiology, ancillary and laboratory) are listed below for reference.   Imaging Studies: ECHOCARDIOGRAM COMPLETE  Result Date: 04/05/2023    ECHOCARDIOGRAM REPORT   Patient Name:   Alyssa Carey Date of Exam: 04/05/2023 Medical Rec #:  [id number]       Height:       57.0 in Accession #:    [id number]  Weight:       181.6 lb Date of Birth:  03-09-1971       BSA:          1.726 m Patient Age:    51 years        BP:           106/59 mmHg Patient Gender: F               HR:           100 bpm. [facility] Procedure: 2D Echo, Cardiac Doppler and Color Doppler Indications:    SVT (supraventricular tachycardia) [202906]  History:        Patient has prior history of Echocardiogram examinations, most                 recent 08/14/2021. Arrythmias:Atrial Flutter and Paroxysmal SVT;                 Risk Factors:Hypertension. Alcohol use and withdrawal.  Sonographer:    Celesta Gentile RCS Referring Phys: [phone] Analiah Drum IMPRESSIONS  1. Left ventricular ejection fraction, by estimation, is 60 to 65%. The left ventricle has normal function. The left ventricle has no  regional wall motion abnormalities. Left ventricular diastolic parameters were normal.  2. Right ventricular systolic function is normal. The right ventricular size is normal. Tricuspid regurgitation signal is inadequate for assessing PA pressure.  3. The mitral valve is grossly normal. No evidence of mitral valve regurgitation.  4. The aortic valve is tricuspid. Aortic valve regurgitation is not visualized.  5. The inferior vena cava is normal in size with greater than 50% respiratory variability, suggesting right atrial pressure of 3 mmHg.  6. Cannot exclude a small PFO. Comparison(s): Prior images reviewed side by side. LVEF normal range at 60-65%. FINDINGS  Left Ventricle: Left ventricular ejection fraction, by estimation, is 60 to 65%. The left ventricle has normal function. The left ventricle has no regional wall motion abnormalities. The left ventricular internal cavity size was normal in size. There is  no left ventricular hypertrophy. Left ventricular diastolic parameters were normal. Right Ventricle: The right ventricular size is normal. No increase in right ventricular wall thickness. Right ventricular systolic function is normal. Tricuspid regurgitation signal is inadequate for assessing PA pressure. Left Atrium: Left atrial size was normal in size. Right Atrium: Right atrial size was normal in size. Pericardium: Trivial pericardial effusion is present. The pericardial effusion is posterior to the left ventricle. Mitral Valve: The mitral valve is grossly normal. No evidence of mitral valve regurgitation. Tricuspid Valve: The tricuspid valve is grossly normal. Tricuspid valve regurgitation is trivial. Aortic Valve: The aortic valve is tricuspid. Aortic valve regurgitation is not visualized. Pulmonic Valve: The pulmonic valve was grossly normal. Pulmonic valve regurgitation is trivial. Aorta: The aortic root is normal in size and structure. Venous: The inferior vena cava is normal in size with greater than  50% respiratory variability, suggesting right atrial pressure of 3 mmHg. IAS/Shunts: Cannot exclude a small PFO.  LEFT VENTRICLE PLAX 2D LVIDd:         4.30 cm   Diastology LVIDs:         2.50 cm   LV e' medial:    10.60 cm/s LV PW:         0.90 cm   LV E/e' medial:  9.6 LV IVS:        0.90 cm   LV e' lateral:   13.90 cm/s LVOT diam:  2.10 cm   LV E/e' lateral: 7.3 LV SV:         81 LV SV Index:   47 LVOT Area:     3.46 cm  RIGHT VENTRICLE RV S prime:     13.20 cm/s TAPSE (M-mode): 2.4 cm LEFT ATRIUM             Index        RIGHT ATRIUM           Index LA diam:        3.00 cm 1.74 cm/m   RA Area:     17.70 cm LA Vol (A2C):   77.6 ml 44.95 ml/m  RA Volume:   54.20 ml  31.40 ml/m LA Vol (A4C):   45.3 ml 26.24 ml/m LA Biplane Vol: 60.0 ml 34.76 ml/m  AORTIC VALVE LVOT Vmax:   114.00 cm/s LVOT Vmean:  78.900 cm/s LVOT VTI:    0.235 m  AORTA Ao Root diam: 3.20 cm MITRAL VALVE MV Area (PHT): 3.12 cm     SHUNTS MV Decel Time: 243 msec     Systemic VTI:  0.24 m MV E velocity: 102.00 cm/s  Systemic Diam: 2.10 cm MV A velocity: 95.10 cm/s MV E/A ratio:  1.07 Nona Dell MD Electronically signed by Nona Dell MD Signature Date/Time: 04/05/2023/3:24:07 PM    Final    US Abdomen Limited RUQ (LIVER/GB)  Result Date: 04/04/2023 CLINICAL DATA:  Transaminitis EXAM: ULTRASOUND ABDOMEN LIMITED RIGHT UPPER QUADRANT COMPARISON:  CT 03/09/2023.  Ultrasound 03/01/2023 FINDINGS: Gallbladder: Mildly distended gallbladder.  No wall thickening or adjacent fluid. Common bile duct: Diameter: 3 mm Liver: Diffusely echogenic hepatic parenchyma consistent with fatty liver infiltration. Portal vein is patent on color Doppler imaging with normal direction of blood flow towards the liver. Other: None. IMPRESSION: No gallstones or ductal dilatation. Electronically Signed   By: Karen Kays M.D.   On: 04/04/2023 15:30   CT ABDOMEN PELVIS W CONTRAST  Result Date: 03/09/2023 CLINICAL DATA:  Lower GI bleed. EXAM: CT ABDOMEN AND  PELVIS WITH CONTRAST TECHNIQUE: Multidetector CT imaging of the abdomen and pelvis was performed using the standard protocol following bolus administration of intravenous contrast. RADIATION DOSE REDUCTION: This exam was performed according to the departmental dose-optimization program which includes automated exposure control, adjustment of the mA and/or kV according to patient size and/or use of iterative reconstruction technique. CONTRAST:  OMNIPAQUE IOHEXOL 300 MG/ML  SOLN COMPARISON:  CT abdomen pelvis dated 11/23/2022. FINDINGS: Lower chest: The visualized lung bases are clear. No intra-abdominal free air or free fluid. Hepatobiliary: Fatty liver. Slight irregularity of the liver contour with enlargement of the left lobe of the liver in keeping with morphologic changes of cirrhosis. No biliary dilatation. Small gallstone. No pericholecystic fluid or evidence of acute cholecystitis by CT. Pancreas: Unremarkable. No pancreatic ductal dilatation or surrounding inflammatory changes. Spleen: Normal in size without focal abnormality. Adrenals/Urinary Tract: The adrenal glands are unremarkable. Small left renal inferior pole exophytic cyst. There is no hydronephrosis on either side. There is symmetric enhancement and excretion of contrast by both kidneys. The visualized ureters and urinary bladder appear unremarkable. Stomach/Bowel: Postsurgical changes of partial sigmoid resection with anastomotic staple line. There is abutment of several loops of small bowel to the left anterior pelvic wall consistent with adhesions. There is protrusion of a short segment of small bowel into the subcutaneous tissues. There is no bowel obstruction or active inflammation. The appendix is normal. Vascular/Lymphatic: The abdominal aorta and IVC  are unremarkable. No portal venous gas. There is no adenopathy. Reproductive: The uterus is anteverted and grossly unremarkable. No adnexal masses. Other: Postsurgical changes of the  posterior perineum and perianal region. No fluid collection. Musculoskeletal: No acute osseous pathology. IMPRESSION: 1. No acute intra-abdominal or pelvic pathology. 2. Postsurgical changes of partial sigmoid resection with anastomotic staple line. No bowel obstruction. Normal appendix. 3. Fatty liver with morphologic changes of cirrhosis. 4. Cholelithiasis. Electronically Signed   By: Elgie Collard M.D.   On: 03/09/2023 22:20    Microbiology: Results for orders placed or performed during the hospital encounter of 11/22/22  Urine Culture     Status: Abnormal   Collection Time: 11/22/22 12:45 AM   Specimen: Urine, Random  Result Value Ref Range Status   Specimen Description   Final    URINE, RANDOM Performed at [facility], [address] [place], [place] [address]    Special Requests   Final    NONE Reflexed from [id number] Performed at [facility], [address], [place], [place] [address]    Culture MULTIPLE SPECIES PRESENT, SUGGEST RECOLLECTION (A)  Final   Report Status 11/23/2022 FINAL  Final    Labs: CBC: Recent Labs  Lab 04/04/23 0349 04/05/23 0448 04/06/23 0500  WBC 5.4 4.7 5.6  NEUTROABS 3.8  --   --   HGB 10.3* 8.5* 8.8*  HCT 32.4* 27.5* 28.4*  MCV 84.6 86.2 87.9  PLT 192 124* 127*   Basic Metabolic Panel: Recent Labs  Lab 04/04/23 0349 04/05/23 0448 04/06/23 0500  NA 131* 135 136  K 4.1 3.3* 3.6  CL 94* 100 102  CO2 20* 24 24  GLUCOSE 71 102* 100*  BUN 7 6 7   CREATININE 0.60 0.49 0.46  CALCIUM 8.3* 8.4* 8.7*  MG  --  1.8 1.9   Liver Function Tests: Recent Labs  Lab 04/04/23 0349 04/05/23 0448  AST 380* 162*  ALT 99* 66*  ALKPHOS 538* 384*  BILITOT 2.0* 2.0*  PROT 7.7 6.4*  ALBUMIN 3.2* 2.7*   CBG: Recent Labs  Lab 04/04/23 2301 04/05/23 0435 04/05/23 0730 04/05/23 1120 04/05/23 1722  GLUCAP 177* 105* 93 118* 121*    Discharge time spent: greater than 30 minutes.  Signed: Catarina Hartshorn, MD [facility] 04/06/2023

## 2023-04-06 NOTE — Plan of Care (Signed)
Problem: Health Behavior/Discharge Planning: Goal: Ability to manage health-related needs will improve Outcome: Progressing   Problem: Clinical Measurements: Goal: Will remain free from infection Outcome: Progressing   Problem: Coping: Goal: Level of anxiety will decrease Outcome: Progressing

## 2023-04-06 NOTE — Progress Notes (Signed)
Did report dark , tarry stool this morning and no bright blood.  Started on diet but ate very little due to not liking the taste.  To be discharged home.  IV removed and discharge instructions reviewed.  Scripts sent to pharmacy and to follow up with would care.  Waiting on ride home

## 2023-04-06 NOTE — Progress Notes (Signed)
Gastroenterology Progress Note   Referring Provider: No ref. provider found Primary Care Physician:  Richardean Chimera, MD Primary Gastroenterologist:  Dr. Marletta Lor  Patient ID: Alyssa Carey; [id number]; Jun 05, 1971    Subjective   Still having looser stools and some dark stools however frequency is less.  Denies any abdominal pain, nausea, or vomiting.  Feels good today and wants to eat again.  She states she is ready to stop having diarrhea and she has taken " a powder that you mix in water" before that helped with her loose stools as she would like to take this again.   Objective   Vital signs in last 24 hours Temp:  [97.9 F (36.6 C)-99.1 F (37.3 C)] 99.1 F (37.3 C) (09/20 0435) Pulse Rate:  [92-101] 99 (09/20 0435) Resp:  [18] 18 (09/20 0435) BP: (92-103)/(61-65) 94/61 (09/20 0435) SpO2:  [99 %-100 %] 99 % (09/20 0435) Last BM Date : 04/04/23  Physical Exam General:   Alert and oriented, pleasant Head:  Normocephalic and atraumatic. Eyes:  No icterus, sclera clear. Conjuctiva pink.  Abdomen:  Bowel sounds present, soft, non-tender, non-distended. No HSM or hernias noted. No rebound or guarding. No masses appreciated.  Neurologic:  Alert and  oriented x4;  grossly normal neurologically. Skin:  Warm and dry, intact without significant lesions.  Psych:  Alert and cooperative. Normal mood and affect.  Intake/Output from previous day: 09/19 0701 - 09/20 0700 In: 1646.5 [P.O.:840; I.V.:656.5; IV Piggyback:150] Out: -  Intake/Output this shift: No intake/output data recorded.  Lab Results  Recent Labs    04/04/23 0349 04/05/23 0448 04/06/23 0500  WBC 5.4 4.7 5.6  HGB 10.3* 8.5* 8.8*  HCT 32.4* 27.5* 28.4*  PLT 192 124* 127*   BMET Recent Labs    04/04/23 0349 04/05/23 0448 04/06/23 0500  NA 131* 135 136  K 4.1 3.3* 3.6  CL 94* 100 102  CO2 20* 24 24  GLUCOSE 71 102* 100*  BUN 7 6 7   CREATININE 0.60 0.49 0.46  CALCIUM 8.3* 8.4* 8.7*   LFT Recent  Labs    04/04/23 0349 04/05/23 0448  PROT 7.7 6.4*  ALBUMIN 3.2* 2.7*  AST 380* 162*  ALT 99* 66*  ALKPHOS 538* 384*  BILITOT 2.0* 2.0*   PT/INR Recent Labs    04/04/23 0349  LABPROT 13.3  INR 1.0   Hepatitis Panel Recent Labs    04/04/23 1548  HEPBSAG PENDING  HCVAB PENDING  HEPAIGM Negative*  HEPBIGM PENDING    Studies/Results ECHOCARDIOGRAM COMPLETE  Result Date: 04/05/2023    ECHOCARDIOGRAM REPORT   Patient Name:   Alyssa Carey Date of Exam: 04/05/2023 Medical Rec #:  [id number]       Height:       57.0 in Accession #:    [id number]      Weight:       181.6 lb Date of Birth:  1971/04/19       BSA:          1.726 m Patient Age:    51 years        BP:           106/59 mmHg Patient Gender: F               HR:           100 bpm. [facility] Procedure: 2D Echo, Cardiac Doppler and Color Doppler Indications:    SVT (supraventricular tachycardia) [202906]  History:  Patient has prior history of Echocardiogram examinations, most                 recent 08/14/2021. Arrythmias:Atrial Flutter and Paroxysmal SVT;                 Risk Factors:Hypertension. Alcohol use and withdrawal.  Sonographer:    Celesta Gentile RCS Referring Phys: [phone] DAVID TAT IMPRESSIONS  1. Left ventricular ejection fraction, by estimation, is 60 to 65%. The left ventricle has normal function. The left ventricle has no regional wall motion abnormalities. Left ventricular diastolic parameters were normal.  2. Right ventricular systolic function is normal. The right ventricular size is normal. Tricuspid regurgitation signal is inadequate for assessing PA pressure.  3. The mitral valve is grossly normal. No evidence of mitral valve regurgitation.  4. The aortic valve is tricuspid. Aortic valve regurgitation is not visualized.  5. The inferior vena cava is normal in size with greater than 50% respiratory variability, suggesting right atrial pressure of 3 mmHg.  6. Cannot exclude a small PFO.  Comparison(s): Prior images reviewed side by side. LVEF normal range at 60-65%. FINDINGS  Left Ventricle: Left ventricular ejection fraction, by estimation, is 60 to 65%. The left ventricle has normal function. The left ventricle has no regional wall motion abnormalities. The left ventricular internal cavity size was normal in size. There is  no left ventricular hypertrophy. Left ventricular diastolic parameters were normal. Right Ventricle: The right ventricular size is normal. No increase in right ventricular wall thickness. Right ventricular systolic function is normal. Tricuspid regurgitation signal is inadequate for assessing PA pressure. Left Atrium: Left atrial size was normal in size. Right Atrium: Right atrial size was normal in size. Pericardium: Trivial pericardial effusion is present. The pericardial effusion is posterior to the left ventricle. Mitral Valve: The mitral valve is grossly normal. No evidence of mitral valve regurgitation. Tricuspid Valve: The tricuspid valve is grossly normal. Tricuspid valve regurgitation is trivial. Aortic Valve: The aortic valve is tricuspid. Aortic valve regurgitation is not visualized. Pulmonic Valve: The pulmonic valve was grossly normal. Pulmonic valve regurgitation is trivial. Aorta: The aortic root is normal in size and structure. Venous: The inferior vena cava is normal in size with greater than 50% respiratory variability, suggesting right atrial pressure of 3 mmHg. IAS/Shunts: Cannot exclude a small PFO.  LEFT VENTRICLE PLAX 2D LVIDd:         4.30 cm   Diastology LVIDs:         2.50 cm   LV e' medial:    10.60 cm/s LV PW:         0.90 cm   LV E/e' medial:  9.6 LV IVS:        0.90 cm   LV e' lateral:   13.90 cm/s LVOT diam:     2.10 cm   LV E/e' lateral: 7.3 LV SV:         81 LV SV Index:   47 LVOT Area:     3.46 cm  RIGHT VENTRICLE RV S prime:     13.20 cm/s TAPSE (M-mode): 2.4 cm LEFT ATRIUM             Index        RIGHT ATRIUM           Index LA diam:         3.00 cm 1.74 cm/m   RA Area:     17.70 cm LA Vol (A2C):   77.6 ml 44.95  ml/m  RA Volume:   54.20 ml  31.40 ml/m LA Vol (A4C):   45.3 ml 26.24 ml/m LA Biplane Vol: 60.0 ml 34.76 ml/m  AORTIC VALVE LVOT Vmax:   114.00 cm/s LVOT Vmean:  78.900 cm/s LVOT VTI:    0.235 m  AORTA Ao Root diam: 3.20 cm MITRAL VALVE MV Area (PHT): 3.12 cm     SHUNTS MV Decel Time: 243 msec     Systemic VTI:  0.24 m MV E velocity: 102.00 cm/s  Systemic Diam: 2.10 cm MV A velocity: 95.10 cm/s MV E/A ratio:  1.07 Nona Dell MD Electronically signed by Nona Dell MD Signature Date/Time: 04/05/2023/3:24:07 PM    Final    US Abdomen Limited RUQ (LIVER/GB)  Result Date: 04/04/2023 CLINICAL DATA:  Transaminitis EXAM: ULTRASOUND ABDOMEN LIMITED RIGHT UPPER QUADRANT COMPARISON:  CT 03/09/2023.  Ultrasound 03/01/2023 FINDINGS: Gallbladder: Mildly distended gallbladder.  No wall thickening or adjacent fluid. Common bile duct: Diameter: 3 mm Liver: Diffusely echogenic hepatic parenchyma consistent with fatty liver infiltration. Portal vein is patent on color Doppler imaging with normal direction of blood flow towards the liver. Other: None. IMPRESSION: No gallstones or ductal dilatation. Electronically Signed   By: Karen Kays M.D.   On: 04/04/2023 15:30   CT ABDOMEN PELVIS W CONTRAST  Result Date: 03/09/2023 CLINICAL DATA:  Lower GI bleed. EXAM: CT ABDOMEN AND PELVIS WITH CONTRAST TECHNIQUE: Multidetector CT imaging of the abdomen and pelvis was performed using the standard protocol following bolus administration of intravenous contrast. RADIATION DOSE REDUCTION: This exam was performed according to the departmental dose-optimization program which includes automated exposure control, adjustment of the mA and/or kV according to patient size and/or use of iterative reconstruction technique. CONTRAST:  OMNIPAQUE IOHEXOL 300 MG/ML  SOLN COMPARISON:  CT abdomen pelvis dated 11/23/2022. FINDINGS: Lower chest: The visualized lung  bases are clear. No intra-abdominal free air or free fluid. Hepatobiliary: Fatty liver. Slight irregularity of the liver contour with enlargement of the left lobe of the liver in keeping with morphologic changes of cirrhosis. No biliary dilatation. Small gallstone. No pericholecystic fluid or evidence of acute cholecystitis by CT. Pancreas: Unremarkable. No pancreatic ductal dilatation or surrounding inflammatory changes. Spleen: Normal in size without focal abnormality. Adrenals/Urinary Tract: The adrenal glands are unremarkable. Small left renal inferior pole exophytic cyst. There is no hydronephrosis on either side. There is symmetric enhancement and excretion of contrast by both kidneys. The visualized ureters and urinary bladder appear unremarkable. Stomach/Bowel: Postsurgical changes of partial sigmoid resection with anastomotic staple line. There is abutment of several loops of small bowel to the left anterior pelvic wall consistent with adhesions. There is protrusion of a short segment of small bowel into the subcutaneous tissues. There is no bowel obstruction or active inflammation. The appendix is normal. Vascular/Lymphatic: The abdominal aorta and IVC are unremarkable. No portal venous gas. There is no adenopathy. Reproductive: The uterus is anteverted and grossly unremarkable. No adnexal masses. Other: Postsurgical changes of the posterior perineum and perianal region. No fluid collection. Musculoskeletal: No acute osseous pathology. IMPRESSION: 1. No acute intra-abdominal or pelvic pathology. 2. Postsurgical changes of partial sigmoid resection with anastomotic staple line. No bowel obstruction. Normal appendix. 3. Fatty liver with morphologic changes of cirrhosis. 4. Cholelithiasis. Electronically Signed   By: Elgie Collard M.D.   On: 03/09/2023 22:20    Assessment  52 y.o. 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  GI consulted for further evaluation.  Concern for cirrhosis/elevated LFTs/dilated CBD: LFTs elevated since last admission in August.  She has had CT A/P in May 2024 concerning for cirrhosis and ultrasound elastography in August with 24 with normal kPa but with distended gallbladder, dilated CBD (11mm), and hepatic steatosis with normal Doppler and repeat CT A/P in August with fatty infiltration and morphologic changes of cirrhosis.  Ultrasound this admission with CBD measuring 3 mm and evidence of cholelithiasis or choledocholithiasis although it remains mildly distended.  Recent EGD without evidence of esophageal varices.  Workup thus far has been negative including normal ferritin and mildly elevated iron saturations in 70%.  Prior acute hepatitis panel negative in 2022, current acute hepatitis panel pending (negative Hep A).  Given her ongoing elevated LFTs she needs outpatient MRI/MRCP for further evaluation previously dilated CBD.  Likely has some component of alcoholic liver disease given ethanol level 65 this admission although she has declined recent EtOH use.  LFTs have downtrended as of yesterday (AST 162, ALT 66, alk phos 384).  T. bili remains stable at 2.  MELD 3.0: 13 (9/20) MELD Na: 9 (9/20)  Melena/PUD/anemia: Hgb on admission 10.3, slightly trending down.8.5>>8.8 today.  EGD in August with nonbleeding gastric ulcer with ulcer base, started on omeprazole 40 mg daily outpatient with recommendation for repeat EGD in 12 weeks.  This admission she has reported compliance with PPI at home and denies any NSAIDs.  She also has denied any EtOH use in the last 2 years however ethanol level this admission was 65.  She continues to have some dark/black stools but they have decreased.  She has  tolerated diet yesterday and will resume diet today given hgb is stable. She continues to have some darker stools however less than.  In regards to her loose stools she would like to consider taking cholestyramine again.   Medications (including BID omeprazole) will be sent to [facility] in [place] per her request. Discussed this with hospitalist who will order on discharge.   Plan / Recommendations  CBC and CMP in 1 week outpatient Follow up acute hepatitis panel Carafate 1g QID for 2 weeks Avoid NSAIDs Repeat EGD 12 weeks from 8/24 (11/18 or after) Alcohol cessation Outpatient MRI abdomen/MRCP 2g sodium diet On discharge will need omeprazole 40 mg BID (not on hospital formulary) Needs outpatient follow up with Lewie Loron, NP in 2-3 weeks    LOS: 0 days   04/06/2023, 10:36 AM  Brooke Bonito, MSN, FNP-BC, AGACNP-BC [facility]

## 2023-04-12 LAB — HEPATITIS PANEL, ACUTE: Hep A IgM: NEGATIVE — AB

## 2023-04-12 NOTE — Telephone Encounter (Signed)
LMOM for pt to call office  

## 2023-04-13 ENCOUNTER — Encounter (HOSPITAL_BASED_OUTPATIENT_CLINIC_OR_DEPARTMENT_OTHER): Payer: Medicaid Other | Admitting: General Surgery

## 2023-04-16 NOTE — Telephone Encounter (Signed)
LMOM for pt to call office  

## 2023-04-18 ENCOUNTER — Encounter: Payer: Self-pay | Admitting: *Deleted

## 2023-04-18 ENCOUNTER — Other Ambulatory Visit: Payer: Self-pay | Admitting: *Deleted

## 2023-04-18 ENCOUNTER — Ambulatory Visit (HOSPITAL_BASED_OUTPATIENT_CLINIC_OR_DEPARTMENT_OTHER): Payer: Medicaid Other | Admitting: General Surgery

## 2023-04-18 DIAGNOSIS — K709 Alcoholic liver disease, unspecified: Secondary | ICD-10-CM

## 2023-04-18 DIAGNOSIS — K529 Noninfective gastroenteritis and colitis, unspecified: Secondary | ICD-10-CM

## 2023-04-18 NOTE — Telephone Encounter (Signed)
Tried several times to reach pt. Mailed a letter.  

## 2023-04-25 ENCOUNTER — Ambulatory Visit (INDEPENDENT_AMBULATORY_CARE_PROVIDER_SITE_OTHER): Payer: Medicaid Other | Admitting: Gastroenterology

## 2023-04-25 ENCOUNTER — Encounter (INDEPENDENT_AMBULATORY_CARE_PROVIDER_SITE_OTHER): Payer: Self-pay | Admitting: Gastroenterology

## 2023-04-25 VITALS — BP 106/76 | HR 83 | Temp 97.1°F | Ht <= 58 in | Wt 175.6 lb

## 2023-04-25 DIAGNOSIS — K838 Other specified diseases of biliary tract: Secondary | ICD-10-CM

## 2023-04-25 DIAGNOSIS — R748 Abnormal levels of other serum enzymes: Secondary | ICD-10-CM

## 2023-04-25 DIAGNOSIS — K7030 Alcoholic cirrhosis of liver without ascites: Secondary | ICD-10-CM

## 2023-04-25 NOTE — Addendum Note (Signed)
Addended by: Marlowe Shores on: 04/25/2023 11:53 AM   Modules accepted: Orders

## 2023-04-25 NOTE — Progress Notes (Signed)
Vista Lawman , M.D. Gastroenterology & Hepatology [facility]enter[facility] [address] [place], [place] [address] Primary Care Physician: Richardean Chimera, MD [address] [place] [address]  Chief Complaint:  Cirrhosis /Elevated liver enzymes /recurrent hematochezia   History of Present Illness:  52 y.o. female with a history of HTN, alcohol abuse, necrotizing fascitis s/p diverticing colostomy June 2021 c/bperstomal hernia/SBO  in early 2023, peptic ulcer disease with  2GI bleed in August 2024, and hepatic steatosis with concerns for early cirrhosis who presented as a follow up with recent hospitalization at [facility] for recurrent hematochezia ( Oct 2024)  Patient reports that her stool is brown now, Bristol stool scale 4 without any diarrhea and denies any abdominal pain.  Patient denies using any NSAID and any alcohol for past 1 year.  Patient was admitted on 04/17/2023 at [facility] health with rectal bleeding.  At that time her hemoglobin dropped to 7.8 CTA was negative for any active bleeding.  Patient underwent upper endoscopy on 04/20/2023 by Dr. Christoper Allegra found to have clean-based pyloric ulcer 13 mm  The patient denies having any nausea, vomiting, fever, chills, hematochezia, melena, hematemesis, abdominal distention, abdominal pain, diarrhea, jaundice, pruritus or weight loss.  Last EGD 04/2023 Patient underwent upper endoscopy on 04/20/2023 by Dr. Christoper Allegra found to have clean-based pyloric ulcer 13 mm  Biopsies negative H.Pylori    EGD 02/2023:- Normal esophagus.                           - Z-line regular.                           - Non-bleeding gastric ulcer with a clean ulcer                            base (Forrest Class III). Biopsied.                           - Normal duodenal bulb and second portion of the                            duodenum. Last Colon 02/2023;- The examined portion of the ileum was normal.                           -  A few erosions in the sigmoid colon. Biopsied.                           - Diverticulosis in the left colon. There was no                            evidence of diverticular bleeding.                           - Erythematous mucosa in the rectum. Biopsied.                           - One 3 mm polyp in the descending colon, removed  with a cold biopsy forceps. Resected and retrieved.                           - Non-bleeding external and internal hemorrhoids.                           -Sacral decubitus stage 2 or stage 3 with clear                            Discharge  Repeat 5 years   Past Medical History: Past Medical History:  Diagnosis Date   Alcohol abuse    HTN (hypertension) 10/05/2020   Necrotizing fasciitis [facility])     Past Surgical History: Past Surgical History:  Procedure Laterality Date   BIOPSY  10/07/2020   Procedure: BIOPSY;  Surgeon: Corbin Ade, MD;  [facility];  Service: Endoscopy;;   BIOPSY  03/10/2023   Procedure: BIOPSY;  Surgeon: Franky Macho, MD;  [facility];  Service: Endoscopy;;   COLONOSCOPY WITH PROPOFOL N/A 10/07/2020   single healing rectal ulcer in distal rectum with surrounding mucosal friability and markedly abnormal proximal colon with ulceration s/p biopsies.   COLONOSCOPY WITH PROPOFOL  02/22/2021   Procedure: COLONOSCOPY WITH PROPOFOL;  Surgeon: Lanelle Bal, DO;  [facility];  Service: Endoscopy;;   COLONOSCOPY WITH PROPOFOL N/A 03/10/2023   Procedure: COLONOSCOPY WITH PROPOFOL;  Surgeon: Franky Macho, MD;  [facility];  Service: Endoscopy;  Laterality: N/A;   COLOSTOMY TAKEDOWN N/A 01/26/2022   Procedure: LAPAROSCOPIC COLOSTOMY TAKEDOWN;  Surgeon: Romie Levee, MD;  Location: [facility];  Service: General;  Laterality: N/A;  laparoscopic assisted colostomy reversal and partial colectomy   ESOPHAGOGASTRODUODENOSCOPY (EGD) WITH PROPOFOL N/A 10/07/2020    non-bleeding gastric ulcer . Pathology with H.pylori negative   ESOPHAGOGASTRODUODENOSCOPY (EGD) WITH PROPOFOL N/A 12/27/2020   Procedure: ESOPHAGOGASTRODUODENOSCOPY (EGD) WITH PROPOFOL;  Surgeon: Lanelle Bal, DO;  [facility];  Service: Endoscopy;  Laterality: N/A;  1:00pm   ESOPHAGOGASTRODUODENOSCOPY (EGD) WITH PROPOFOL N/A 01/17/2023   Procedure: ESOPHAGOGASTRODUODENOSCOPY (EGD) WITH PROPOFOL;  Surgeon: Corbin Ade, MD;  [facility];  Service: Endoscopy;  Laterality: N/A;   ESOPHAGOGASTRODUODENOSCOPY (EGD) WITH PROPOFOL N/A 03/10/2023   Procedure: ESOPHAGOGASTRODUODENOSCOPY (EGD) WITH PROPOFOL;  Surgeon: Franky Macho, MD;  [facility];  Service: Endoscopy;  Laterality: N/A;   FLEXIBLE SIGMOIDOSCOPY N/A 01/11/2020   Procedure: FLEXIBLE SIGMOIDOSCOPY;  Surgeon: Jeani Hawking, MD;  [facility];  Service: Gastroenterology;  Laterality: N/A;   INCISION AND DRAINAGE PERIRECTAL ABSCESS N/A 12/25/2019   Procedure: IRRIGATION AND DEBRIDEMENT BUTTOCKS, LAP LOOP COLOSTOMY;  Surgeon: Gaynelle Adu, MD;  Location: [facility];  Service: General;  Laterality: N/A;   IRRIGATION AND DEBRIDEMENT ABSCESS N/A 12/23/2019   Procedure: EXCISION AND DEBRIDEMENT LEFT BUTTOCK AND PERINEUM;  Surgeon: Berna Bue, MD;  [facility];  Service: General;  Laterality: N/A;   LAPAROSCOPIC LOOP COLOSTOMY N/A 12/25/2019   Procedure: LAPAROSCOPIC LOOP COLOSTOMY;  Surgeon: Gaynelle Adu, MD;  Location: [facility];  Service: General;  Laterality: N/A;   POLYPECTOMY  03/10/2023   Procedure: POLYPECTOMY;  Surgeon: Franky Macho, MD;  [facility];  Service: Endoscopy;;   TONSILLECTOMY      Family History: Family History  Problem Relation Age of Onset   Colon polyps Mother  does not believe adenomas   Arrhythmia Mother    Colon cancer Neg Hx     Social History: Social History   Tobacco Use  Smoking Status Never   Passive exposure: Never   Smokeless Tobacco Never   Social History   Substance and Sexual Activity  Alcohol Use Not Currently   Social History   Substance and Sexual Activity  Drug Use Not Currently    Allergies: Allergies  Allergen Reactions   Pantoprazole     Stomach pain   Penicillins Hives   Oxycodone Hcl Rash    Medications: Current Outpatient Medications  Medication Sig Dispense Refill   cholestyramine (QUESTRAN) 4 g packet Take 1 packet (4 g total) by mouth daily as needed (diarrhea). 30 each 1   ferrous sulfate 325 (65 FE) MG EC tablet Take 325 mg by mouth daily with breakfast.     fluticasone (FLONASE) 50 MCG/ACT nasal spray Place 2 sprays into both nostrils as needed for allergies or rhinitis.     folic acid (FOLVITE) 1 MG tablet Take 1 tablet (1 mg total) by mouth daily.     gabapentin (NEURONTIN) 100 MG capsule Take 1 capsule (100 mg total) by mouth at bedtime as needed (pain).     hydrocortisone (ANUSOL-HC) 2.5 % rectal cream Place 1 Application rectally 2 (two) times daily. (Patient taking differently: Place 1 Application rectally as needed for hemorrhoids or anal itching.) 30 g 1   hydrOXYzine (ATARAX) 25 MG tablet Take 25 mg by mouth as needed for itching.     loratadine (CLARITIN) 10 MG tablet Take 1 tablet (10 mg total) by mouth daily. (Patient taking differently: Take 10 mg by mouth daily as needed for allergies.) 30 tablet 1   montelukast (SINGULAIR) 10 MG tablet Take 10 mg by mouth at bedtime.     Nystatin (GERHARDT'S BUTT CREAM) CREA Apply 1 Application topically 4 (four) times daily as needed for irritation.     omeprazole (PRILOSEC) 40 MG capsule Take 1 capsule (40 mg total) by mouth 2 (two) times daily. 60 capsule 1   sucralfate (CARAFATE) 1 GM/10ML suspension Take 10 mLs (1 g total) by mouth 4 (four) times daily -  with meals and at bedtime. 473 mL 2   metoprolol tartrate (LOPRESSOR) 25 MG tablet Take 0.5 tablets (12.5 mg total) by mouth 2 (two) times daily. (Patient not  taking: Reported on 04/25/2023) 60 tablet 1   No current facility-administered medications for this visit.    Review of Systems: GENERAL: negative for malaise, night sweats HEENT: No changes in hearing or vision, no nose bleeds or other nasal problems. NECK: Negative for lumps, goiter, pain and significant neck swelling RESPIRATORY: Negative for cough, wheezing CARDIOVASCULAR: Negative for chest pain, leg swelling, palpitations, orthopnea GI: SEE HPI MUSCULOSKELETAL: Negative for joint pain or swelling, back pain, and muscle pain. SKIN: Negative for lesions, rash HEMATOLOGY Negative for prolonged bleeding, bruising easily, and swollen nodes. ENDOCRINE: Negative for cold or heat intolerance, polyuria, polydipsia and goiter. NEURO: negative for tremor, gait imbalance, syncope and seizures. The remainder of the review of systems is noncontributory.   Physical Exam: BP 106/76 (BP Location: Left Arm, Patient Position: Sitting, Cuff Size: Normal)   Pulse 83   Temp (!) 97.1 F (36.2 C) (Temporal)   Ht 4\' 9"  (1.448 m)   Wt 175 lb 9.6 oz (79.7 kg)   LMP 11/30/2021   BMI 38.00 kg/m  GENERAL: The patient is AO x3, in no acute distress. HEENT: Head  is normocephalic and atraumatic. EOMI are intact. Mouth is well hydrated and without lesions. NECK: Supple. No masses LUNGS: Clear to auscultation. No presence of rhonchi/wheezing/rales. Adequate chest expansion HEART: RRR, normal s1 and s2. ABDOMEN: Soft, nontender, no guarding, no peritoneal signs, and nondistended. BS +. No masses. EXTREMITIES: Without any cyanosis, clubbing, rash, lesions or edema. NEUROLOGIC: AOx3, no focal motor deficit. SKIN: no jaundice, no rashes   Imaging/Labs: as above     Latest Ref Rng & Units 04/06/2023    5:00 AM 04/05/2023    4:48 AM 04/04/2023    3:49 AM  CBC  WBC 4.0 - 10.5 K/uL 5.6  4.7  5.4   Hemoglobin 12.0 - 15.0 g/dL 8.8  8.5  40.9   Hematocrit 36.0 - 46.0 % 28.4  27.5  32.4   Platelets 150 - 400  K/uL 127  124  192    Lab Results  Component Value Date   IRON 232 (H) 04/04/2023   TIBC 301 04/04/2023   FERRITIN 44 04/04/2023    I personally reviewed and interpreted the available labs, imaging and endoscopic files.  Impression and Plan:  52 y.o. female with a history of HTN, alcohol abuse, necrotizing fascitis s/p diverticing colostomy June 2021 c/bperstomal hernia/SBO  in early 2023, peptic ulcer disease with  2GI bleed in August 2024, and hepatic steatosis with concerns for early cirrhosis who presented as a follow up with recent hospitalization at [facility] health for recurrent hematochezia ( Oct 2024)  #Recurrent Hematochezia #Normocytic Anemia #PUD  Patient had at least 2 admissions in past 2 months recurrent hematochezia and possible melena.  Most recent admission a week ago at [facility] health where she underwent upper endoscopy found to have 13 mm clean-based gastric ulcer, biopsies again negative for H. Pylori.  Patient had colonoscopy 02/2023 with diverticulosis no overt bleeding and normal TI  Given reports of hematochezia and recurrent admission to EGDs demonstrating peptic ulcer disease and colonoscopy negative we will obtain small bowel capsule endoscopy as gastric ulcer does not explain recurrent hematochezia  Depending on results of small bowel capsule endoscopy we may repeat upper endoscopy in 2 to 3 months to assess healing of the gastric ulcer  Omeprazole daily 30 minutes before breakfast and avoid all NSAIDs  #Compensated cirrhosis #Elevated liver enzymes #CBD Dilation    MELD 3.0: 13 at 04/06/2023  5:00 AM MELD-Na: 9 at 04/06/2023  5:00 AM Calculated from: Serum Creatinine: 0.46 mg/dL (Using min of 1 mg/dL) at 02/24/9146  8:29 AM Serum Sodium: 136 mmol/L at 04/06/2023  5:00 AM Total Bilirubin: 2.0 mg/dL at 5/62/1308  6:57 AM Serum Albumin: 2.7 g/dL at 8/46/9629  5:28 AM INR(ratio): 1.0 at 04/04/2023  3:49 AM Age at listing (hypothetical): 51 years Sex: Female  at 04/06/2023  5:00 AM   #Eitology :    Likely etiology is alcohol use.  AST more than ALT.last viral hepatitis profile checked in 2022 hep A nonimmune hep B negative and nonimmune hep C negative.  ANA , AMA negative,ASMA  Normal alpha-1 antitrypsin   # Hepatic encephalopathy None - Avoid opiates or benzodiazepines   # Ascites None on exam and imaging   # Esophageal varices No varices on recent upper endoscopy   # HCC screening CT done 04/2023 without any focal lesion. Negative AFP 02/2023    Recommendation   -Patient need to be enrolled in every 6 month HCC screening with ultrasound and AFP -MRI abdomen with MRCP for CBD dilation -Repeat iron studies given significantly  elevated Tsat  - PETH testing , if PETH is negative patient may beed liver biopsy In future given consistently elevated liver enzymes  - Repeat MELD Labs   RTC 4-6 weeks   All questions were answered.      Vista Lawman, MD Gastroenterology and Hepatology [facility]   This chart has been completed using [facility] Dictation software, and while attempts have been made to ensure accuracy , certain words and phrases may not be transcribed as intended

## 2023-05-01 ENCOUNTER — Telehealth (INDEPENDENT_AMBULATORY_CARE_PROVIDER_SITE_OTHER): Payer: Self-pay | Admitting: Gastroenterology

## 2023-05-01 NOTE — Telephone Encounter (Signed)
MRI pending via [facility] request faxed to [facility] for capsule endoscopy.

## 2023-05-03 NOTE — Telephone Encounter (Signed)
Pt returned call and scheduled for 05/21/23 at 730. Instructions mailed to pt. Still awaiting decision regarding MRI

## 2023-05-03 NOTE — Telephone Encounter (Signed)
[facility] endoscopy has been approved via insurance. Valid dates 05/01/23-06/30/23. Left message to return call to schedule.  Still awaiting Mri decision

## 2023-05-04 NOTE — Telephone Encounter (Signed)
Clinical notes refaxed to [facility] for MRI

## 2023-05-07 NOTE — Telephone Encounter (Signed)
MRI approved via [facility]. Valid dates 05/01/23-06/30/23.  Pt scheduled for 05/16/23 at 9:15am for [place] and 11:00am for MRI. Pt will need to be NPO for 6-8 hours. Arrive 15 minutes early.  Left message for pt to return call. Will send reminder letter via mail.

## 2023-05-16 ENCOUNTER — Ambulatory Visit (HOSPITAL_COMMUNITY): Admission: RE | Admit: 2023-05-16 | Payer: Medicaid Other | Source: Ambulatory Visit

## 2023-05-16 ENCOUNTER — Ambulatory Visit (HOSPITAL_COMMUNITY): Payer: Medicaid Other | Attending: Gastroenterology

## 2023-05-21 ENCOUNTER — Encounter (HOSPITAL_COMMUNITY): Admission: RE | Payer: Self-pay | Source: Home / Self Care

## 2023-05-21 ENCOUNTER — Encounter (HOSPITAL_COMMUNITY): Payer: Self-pay | Admitting: *Deleted

## 2023-05-21 ENCOUNTER — Ambulatory Visit (HOSPITAL_COMMUNITY): Admission: RE | Admit: 2023-05-21 | Payer: Medicaid Other | Source: Home / Self Care

## 2023-05-21 SURGERY — IMAGING PROCEDURE, GI TRACT, INTRALUMINAL, VIA CAPSULE
Anesthesia: Monitor Anesthesia Care

## 2023-05-29 ENCOUNTER — Other Ambulatory Visit: Payer: Self-pay

## 2023-05-29 ENCOUNTER — Emergency Department (HOSPITAL_COMMUNITY): Payer: Self-pay

## 2023-05-29 ENCOUNTER — Inpatient Hospital Stay (HOSPITAL_COMMUNITY)
Admission: EM | Admit: 2023-05-29 | Discharge: 2023-06-02 | DRG: 433 | Disposition: A | Discharge status: Home / Self Care | Payer: Self-pay | Source: Ambulatory Visit | Attending: Family Medicine | Admitting: Family Medicine

## 2023-05-29 ENCOUNTER — Encounter (HOSPITAL_COMMUNITY): Payer: Self-pay | Admitting: Emergency Medicine

## 2023-05-29 DIAGNOSIS — Z8711 Personal history of peptic ulcer disease: Secondary | ICD-10-CM

## 2023-05-29 DIAGNOSIS — Z888 Allergy status to other drugs, medicaments and biological substances status: Secondary | ICD-10-CM

## 2023-05-29 DIAGNOSIS — K219 Gastro-esophageal reflux disease without esophagitis: Secondary | ICD-10-CM | POA: Diagnosis present

## 2023-05-29 DIAGNOSIS — Z83719 Family history of colon polyps, unspecified: Secondary | ICD-10-CM

## 2023-05-29 DIAGNOSIS — R1011 Right upper quadrant pain: Secondary | ICD-10-CM

## 2023-05-29 DIAGNOSIS — Z6836 Body mass index (BMI) 36.0-36.9, adult: Secondary | ICD-10-CM

## 2023-05-29 DIAGNOSIS — Z79899 Other long term (current) drug therapy: Secondary | ICD-10-CM

## 2023-05-29 DIAGNOSIS — Z8739 Personal history of other diseases of the musculoskeletal system and connective tissue: Secondary | ICD-10-CM

## 2023-05-29 DIAGNOSIS — E876 Hypokalemia: Secondary | ICD-10-CM | POA: Diagnosis present

## 2023-05-29 DIAGNOSIS — E66812 Obesity, class 2: Secondary | ICD-10-CM | POA: Diagnosis present

## 2023-05-29 DIAGNOSIS — E878 Other disorders of electrolyte and fluid balance, not elsewhere classified: Secondary | ICD-10-CM | POA: Diagnosis present

## 2023-05-29 DIAGNOSIS — K828 Other specified diseases of gallbladder: Secondary | ICD-10-CM | POA: Diagnosis present

## 2023-05-29 DIAGNOSIS — K7030 Alcoholic cirrhosis of liver without ascites: Principal | ICD-10-CM | POA: Diagnosis present

## 2023-05-29 DIAGNOSIS — R160 Hepatomegaly, not elsewhere classified: Secondary | ICD-10-CM | POA: Diagnosis present

## 2023-05-29 DIAGNOSIS — I10 Essential (primary) hypertension: Secondary | ICD-10-CM | POA: Diagnosis present

## 2023-05-29 DIAGNOSIS — Z885 Allergy status to narcotic agent status: Secondary | ICD-10-CM

## 2023-05-29 DIAGNOSIS — R7989 Other specified abnormal findings of blood chemistry: Secondary | ICD-10-CM | POA: Diagnosis present

## 2023-05-29 DIAGNOSIS — E871 Hypo-osmolality and hyponatremia: Secondary | ICD-10-CM | POA: Diagnosis present

## 2023-05-29 DIAGNOSIS — Z8719 Personal history of other diseases of the digestive system: Secondary | ICD-10-CM

## 2023-05-29 DIAGNOSIS — D61818 Other pancytopenia: Secondary | ICD-10-CM | POA: Diagnosis not present

## 2023-05-29 DIAGNOSIS — Z880 Allergy status to penicillin: Secondary | ICD-10-CM

## 2023-05-29 DIAGNOSIS — K760 Fatty (change of) liver, not elsewhere classified: Secondary | ICD-10-CM | POA: Diagnosis present

## 2023-05-29 DIAGNOSIS — R1110 Vomiting, unspecified: Secondary | ICD-10-CM

## 2023-05-29 DIAGNOSIS — F1011 Alcohol abuse, in remission: Secondary | ICD-10-CM | POA: Diagnosis present

## 2023-05-29 DIAGNOSIS — R7401 Elevation of levels of liver transaminase levels: Principal | ICD-10-CM | POA: Diagnosis present

## 2023-05-29 LAB — CBC
HCT: 32.6 % — ABNORMAL LOW (ref 36.0–46.0)
Hemoglobin: 10.7 g/dL — ABNORMAL LOW (ref 12.0–15.0)
MCH: 30.6 pg (ref 26.0–34.0)
MCHC: 32.8 g/dL (ref 30.0–36.0)
MCV: 93.1 fL (ref 80.0–100.0)
Platelets: 127 10*3/uL — ABNORMAL LOW (ref 150–400)
RBC: 3.5 MIL/uL — ABNORMAL LOW (ref 3.87–5.11)
RDW: 15.7 % — ABNORMAL HIGH (ref 11.5–15.5)
WBC: 4.9 10*3/uL (ref 4.0–10.5)
nRBC: 0 % (ref 0.0–0.2)

## 2023-05-29 LAB — COMPREHENSIVE METABOLIC PANEL
ALT: 135 U/L — ABNORMAL HIGH (ref 0–44)
AST: 466 U/L — ABNORMAL HIGH (ref 15–41)
Albumin: 3 g/dL — ABNORMAL LOW (ref 3.5–5.0)
Alkaline Phosphatase: 467 U/L — ABNORMAL HIGH (ref 38–126)
Anion gap: 20 — ABNORMAL HIGH (ref 5–15)
BUN: <5 mg/dL — ABNORMAL LOW (ref 6–20)
CO2: 18 mmol/L — ABNORMAL LOW (ref 22–32)
Calcium: 8.7 mg/dL — ABNORMAL LOW (ref 8.9–10.3)
Chloride: 91 mmol/L — ABNORMAL LOW (ref 98–111)
Creatinine, Ser: 0.49 mg/dL (ref 0.44–1.00)
GFR, Estimated: >60 mL/min (ref >60)
Glucose, Bld: 88 mg/dL (ref 70–99)
Potassium: 3.3 mmol/L — ABNORMAL LOW (ref 3.5–5.1)
Sodium: 129 mmol/L — ABNORMAL LOW (ref 135–145)
Total Bilirubin: 4.6 mg/dL — ABNORMAL HIGH (ref <1.2)
Total Protein: 8 g/dL (ref 6.5–8.1)

## 2023-05-29 LAB — LIPASE, BLOOD: Lipase: 28 U/L (ref 11–51)

## 2023-05-29 MED ORDER — IOHEXOL 300 MG/ML  SOLN
100.0000 mL | Freq: Once | INTRAMUSCULAR | Status: AC | PRN
  Administered 2023-05-30: 100 mL via INTRAVENOUS

## 2023-05-29 NOTE — ED Triage Notes (Signed)
Pt was sent by PCP for n/v/d x 3-4 days, no other family members with similar symptoms

## 2023-05-30 ENCOUNTER — Inpatient Hospital Stay (HOSPITAL_COMMUNITY): Payer: Self-pay

## 2023-05-30 ENCOUNTER — Ambulatory Visit (INDEPENDENT_AMBULATORY_CARE_PROVIDER_SITE_OTHER): Payer: Medicaid Other | Admitting: Gastroenterology

## 2023-05-30 ENCOUNTER — Inpatient Hospital Stay (HOSPITAL_COMMUNITY): Payer: Medicaid Other

## 2023-05-30 DIAGNOSIS — R1011 Right upper quadrant pain: Secondary | ICD-10-CM | POA: Diagnosis present

## 2023-05-30 DIAGNOSIS — K838 Other specified diseases of biliary tract: Secondary | ICD-10-CM

## 2023-05-30 DIAGNOSIS — E878 Other disorders of electrolyte and fluid balance, not elsewhere classified: Secondary | ICD-10-CM | POA: Diagnosis present

## 2023-05-30 DIAGNOSIS — Z6836 Body mass index (BMI) 36.0-36.9, adult: Secondary | ICD-10-CM | POA: Diagnosis not present

## 2023-05-30 DIAGNOSIS — R160 Hepatomegaly, not elsewhere classified: Secondary | ICD-10-CM | POA: Diagnosis present

## 2023-05-30 DIAGNOSIS — I10 Essential (primary) hypertension: Secondary | ICD-10-CM | POA: Diagnosis present

## 2023-05-30 DIAGNOSIS — Z8719 Personal history of other diseases of the digestive system: Secondary | ICD-10-CM | POA: Diagnosis not present

## 2023-05-30 DIAGNOSIS — Z885 Allergy status to narcotic agent status: Secondary | ICD-10-CM | POA: Diagnosis not present

## 2023-05-30 DIAGNOSIS — Z880 Allergy status to penicillin: Secondary | ICD-10-CM | POA: Diagnosis not present

## 2023-05-30 DIAGNOSIS — Z79899 Other long term (current) drug therapy: Secondary | ICD-10-CM | POA: Diagnosis not present

## 2023-05-30 DIAGNOSIS — D61818 Other pancytopenia: Secondary | ICD-10-CM | POA: Diagnosis not present

## 2023-05-30 DIAGNOSIS — K760 Fatty (change of) liver, not elsewhere classified: Secondary | ICD-10-CM | POA: Diagnosis present

## 2023-05-30 DIAGNOSIS — Z888 Allergy status to other drugs, medicaments and biological substances status: Secondary | ICD-10-CM | POA: Diagnosis not present

## 2023-05-30 DIAGNOSIS — R7989 Other specified abnormal findings of blood chemistry: Secondary | ICD-10-CM | POA: Diagnosis present

## 2023-05-30 DIAGNOSIS — R112 Nausea with vomiting, unspecified: Secondary | ICD-10-CM

## 2023-05-30 DIAGNOSIS — K7030 Alcoholic cirrhosis of liver without ascites: Secondary | ICD-10-CM

## 2023-05-30 DIAGNOSIS — E871 Hypo-osmolality and hyponatremia: Secondary | ICD-10-CM | POA: Diagnosis present

## 2023-05-30 DIAGNOSIS — K219 Gastro-esophageal reflux disease without esophagitis: Secondary | ICD-10-CM | POA: Diagnosis present

## 2023-05-30 DIAGNOSIS — D649 Anemia, unspecified: Secondary | ICD-10-CM

## 2023-05-30 DIAGNOSIS — E66812 Obesity, class 2: Secondary | ICD-10-CM | POA: Diagnosis present

## 2023-05-30 DIAGNOSIS — E876 Hypokalemia: Secondary | ICD-10-CM | POA: Diagnosis present

## 2023-05-30 DIAGNOSIS — F1011 Alcohol abuse, in remission: Secondary | ICD-10-CM | POA: Diagnosis present

## 2023-05-30 DIAGNOSIS — Z8711 Personal history of peptic ulcer disease: Secondary | ICD-10-CM | POA: Diagnosis not present

## 2023-05-30 DIAGNOSIS — Z83719 Family history of colon polyps, unspecified: Secondary | ICD-10-CM | POA: Diagnosis not present

## 2023-05-30 DIAGNOSIS — K828 Other specified diseases of gallbladder: Secondary | ICD-10-CM | POA: Diagnosis present

## 2023-05-30 DIAGNOSIS — Z8739 Personal history of other diseases of the musculoskeletal system and connective tissue: Secondary | ICD-10-CM | POA: Diagnosis not present

## 2023-05-30 LAB — CBC WITH DIFFERENTIAL/PLATELET
Abs Immature Granulocytes: 0.02 10*3/uL (ref 0.00–0.07)
Basophils Absolute: 0 10*3/uL (ref 0.0–0.1)
Basophils Relative: 0 %
Eosinophils Absolute: 0 10*3/uL (ref 0.0–0.5)
Eosinophils Relative: 0 %
HCT: 25.8 % — ABNORMAL LOW (ref 36.0–46.0)
Hemoglobin: 8.5 g/dL — ABNORMAL LOW (ref 12.0–15.0)
Immature Granulocytes: 0 %
Lymphocytes Relative: 13 %
Lymphs Abs: 0.6 10*3/uL — ABNORMAL LOW (ref 0.7–4.0)
MCH: 30.9 pg (ref 26.0–34.0)
MCHC: 32.9 g/dL (ref 30.0–36.0)
MCV: 93.8 fL (ref 80.0–100.0)
Monocytes Absolute: 0.4 10*3/uL (ref 0.1–1.0)
Monocytes Relative: 7 %
Neutro Abs: 3.8 10*3/uL (ref 1.7–7.7)
Neutrophils Relative %: 80 %
Platelets: 109 10*3/uL — ABNORMAL LOW (ref 150–400)
RBC: 2.75 MIL/uL — ABNORMAL LOW (ref 3.87–5.11)
RDW: 15.7 % — ABNORMAL HIGH (ref 11.5–15.5)
WBC: 4.9 10*3/uL (ref 4.0–10.5)
nRBC: 0 % (ref 0.0–0.2)

## 2023-05-30 LAB — COMPREHENSIVE METABOLIC PANEL
ALT: 105 U/L — ABNORMAL HIGH (ref 0–44)
AST: 319 U/L — ABNORMAL HIGH (ref 15–41)
Albumin: 2.3 g/dL — ABNORMAL LOW (ref 3.5–5.0)
Alkaline Phosphatase: 358 U/L — ABNORMAL HIGH (ref 38–126)
Anion gap: 11 (ref 5–15)
BUN: <5 mg/dL — ABNORMAL LOW (ref 6–20)
CO2: 18 mmol/L — ABNORMAL LOW (ref 22–32)
Calcium: 7.7 mg/dL — ABNORMAL LOW (ref 8.9–10.3)
Chloride: 101 mmol/L (ref 98–111)
Creatinine, Ser: 0.5 mg/dL (ref 0.44–1.00)
GFR, Estimated: >60 mL/min (ref >60)
Glucose, Bld: 88 mg/dL (ref 70–99)
Potassium: 2.9 mmol/L — ABNORMAL LOW (ref 3.5–5.1)
Sodium: 130 mmol/L — ABNORMAL LOW (ref 135–145)
Total Bilirubin: 4.1 mg/dL — ABNORMAL HIGH (ref <1.2)
Total Protein: 6.5 g/dL (ref 6.5–8.1)

## 2023-05-30 LAB — TSH: TSH: 4.015 u[IU]/mL (ref 0.350–4.500)

## 2023-05-30 LAB — PROTIME-INR
INR: 0.9 (ref 0.8–1.2)
Prothrombin Time: 12.6 s (ref 11.4–15.2)

## 2023-05-30 LAB — MAGNESIUM: Magnesium: 1.5 mg/dL — ABNORMAL LOW (ref 1.7–2.4)

## 2023-05-30 LAB — HEPATITIS PANEL, ACUTE
HCV Ab: NONREACTIVE
Hep A IgM: NONREACTIVE
Hep B C IgM: NONREACTIVE
Hepatitis B Surface Ag: NONREACTIVE

## 2023-05-30 LAB — GLUCOSE, CAPILLARY: Glucose-Capillary: 142 mg/dL — ABNORMAL HIGH (ref 70–99)

## 2023-05-30 LAB — ACETAMINOPHEN LEVEL: Acetaminophen (Tylenol), Serum: <10 ug/mL — ABNORMAL LOW (ref 10–30)

## 2023-05-30 MED ORDER — SODIUM CHLORIDE 0.9% FLUSH: 3.0000 mL | INTRAVENOUS | Status: DC | PRN

## 2023-05-30 MED ORDER — SODIUM CHLORIDE 0.9 % IV BOLUS
1000.0000 mL | Freq: Once | INTRAVENOUS | Status: AC
  Administered 2023-05-30: 1000 mL via INTRAVENOUS

## 2023-05-30 MED ORDER — HYDROXYZINE HCL 25 MG PO TABS
25.0000 mg | ORAL_TABLET | Freq: Four times a day (QID) | ORAL | Status: DC | PRN
  Administered 2023-05-31 – 2023-06-01 (×4): 25 mg via ORAL
  Filled 2023-05-30 (×4): qty 1

## 2023-05-30 MED ORDER — MONTELUKAST SODIUM 10 MG PO TABS
10.0000 mg | ORAL_TABLET | Freq: Every day | ORAL | Status: DC
  Administered 2023-05-30 – 2023-06-01 (×3): 10 mg via ORAL
  Filled 2023-05-30 (×3): qty 1

## 2023-05-30 MED ORDER — MORPHINE SULFATE (PF) 2 MG/ML IV SOLN
2.0000 mg | INTRAVENOUS | Status: DC | PRN
Start: 2023-05-30 — End: 2023-06-02
  Administered 2023-05-30 – 2023-06-01 (×5): 2 mg via INTRAVENOUS
  Filled 2023-05-30 (×6): qty 1

## 2023-05-30 MED ORDER — MORPHINE SULFATE (PF) 4 MG/ML IV SOLN
4.0000 mg | Freq: Once | INTRAVENOUS | Status: AC
  Administered 2023-05-30: 4 mg via INTRAVENOUS
  Filled 2023-05-30: qty 1

## 2023-05-30 MED ORDER — ONDANSETRON HCL 4 MG PO TABS: 4.0000 mg | ORAL_TABLET | Freq: Four times a day (QID) | ORAL | Status: DC | PRN

## 2023-05-30 MED ORDER — LACTATED RINGERS IV BOLUS: 1000.0000 mL | Freq: Once | INTRAVENOUS | Status: DC

## 2023-05-30 MED ORDER — TRAZODONE HCL 50 MG PO TABS
50.0000 mg | ORAL_TABLET | Freq: Every day | ORAL | Status: DC
  Administered 2023-05-30 – 2023-06-01 (×4): 50 mg via ORAL
  Filled 2023-05-30 (×4): qty 1

## 2023-05-30 MED ORDER — IBUPROFEN 400 MG PO TABS: 400.0000 mg | ORAL_TABLET | Freq: Four times a day (QID) | ORAL | Status: DC | PRN

## 2023-05-30 MED ORDER — POTASSIUM CHLORIDE CRYS ER 20 MEQ PO TBCR
40.0000 meq | EXTENDED_RELEASE_TABLET | ORAL | Status: AC
  Administered 2023-05-30 (×2): 40 meq via ORAL
  Filled 2023-05-30 (×2): qty 2

## 2023-05-30 MED ORDER — LACTATED RINGERS IV BOLUS
1000.0000 mL | Freq: Once | INTRAVENOUS | Status: AC
  Administered 2023-05-30: 1000 mL via INTRAVENOUS

## 2023-05-30 MED ORDER — PANTOPRAZOLE SODIUM 40 MG IV SOLR
40.0000 mg | INTRAVENOUS | Status: DC
  Administered 2023-05-31 – 2023-06-02 (×3): 40 mg via INTRAVENOUS
  Filled 2023-05-30 (×3): qty 10

## 2023-05-30 MED ORDER — ONDANSETRON HCL 4 MG/2ML IJ SOLN
4.0000 mg | Freq: Four times a day (QID) | INTRAMUSCULAR | Status: DC | PRN
  Administered 2023-05-30 – 2023-06-02 (×6): 4 mg via INTRAVENOUS
  Filled 2023-05-30 (×6): qty 2

## 2023-05-30 MED ORDER — SODIUM CHLORIDE 0.9% FLUSH
3.0000 mL | Freq: Two times a day (BID) | INTRAVENOUS | Status: DC
  Administered 2023-05-31 – 2023-06-02 (×5): 3 mL via INTRAVENOUS

## 2023-05-30 MED ORDER — POTASSIUM CHLORIDE 10 MEQ/100ML IV SOLN
10.0000 meq | INTRAVENOUS | Status: AC
  Administered 2023-05-30 (×4): 10 meq via INTRAVENOUS
  Filled 2023-05-30 (×4): qty 100

## 2023-05-30 MED ORDER — MAGNESIUM SULFATE 4 GM/100ML IV SOLN
4.0000 g | Freq: Once | INTRAVENOUS | Status: AC
  Administered 2023-05-30: 4 g via INTRAVENOUS
  Filled 2023-05-30: qty 100

## 2023-05-30 MED ORDER — SODIUM CHLORIDE 0.9 % IV SOLN: INTRAVENOUS | Status: AC | PRN

## 2023-05-30 MED ORDER — SODIUM CHLORIDE 0.9% FLUSH
3.0000 mL | Freq: Two times a day (BID) | INTRAVENOUS | Status: DC
  Administered 2023-05-31 – 2023-06-01 (×4): 3 mL via INTRAVENOUS

## 2023-05-30 MED ORDER — HYDROXYZINE HCL 25 MG PO TABS
25.0000 mg | ORAL_TABLET | ORAL | Status: DC | PRN
  Administered 2023-05-30 (×2): 25 mg via ORAL
  Filled 2023-05-30 (×3): qty 1

## 2023-05-30 MED ORDER — GADOBUTROL 1 MMOL/ML IV SOLN
7.5000 mL | Freq: Once | INTRAVENOUS | Status: AC | PRN
  Administered 2023-05-30: 7.5 mL via INTRAVENOUS

## 2023-05-30 MED ORDER — HEPARIN SODIUM (PORCINE) 5000 UNIT/ML IJ SOLN
5000.0000 [IU] | Freq: Three times a day (TID) | INTRAMUSCULAR | Status: DC
  Administered 2023-05-30 – 2023-06-02 (×9): 5000 [IU] via SUBCUTANEOUS
  Filled 2023-05-30 (×10): qty 1

## 2023-05-30 MED ORDER — POLYETHYLENE GLYCOL 3350 17 G PO PACK: 17.0000 g | PACK | Freq: Every day | ORAL | Status: DC | PRN

## 2023-05-30 MED ORDER — GABAPENTIN 100 MG PO CAPS: 100.0000 mg | ORAL_CAPSULE | Freq: Every evening | ORAL | Status: DC | PRN

## 2023-05-30 MED ORDER — DEXTROSE-SODIUM CHLORIDE 5-0.9 % IV SOLN: INTRAVENOUS | Status: AC

## 2023-05-30 MED ORDER — ORAL CARE MOUTH RINSE: 15.0000 mL | OROMUCOSAL | Status: DC | PRN

## 2023-05-30 MED ORDER — BISACODYL 10 MG RE SUPP: 10.0000 mg | Freq: Every day | RECTAL | Status: DC | PRN

## 2023-05-30 NOTE — Plan of Care (Signed)
Rested well during the night.  C/o abdominal pain-received one dose of morphine during the night. Also received atarax for itching.  Problem: Education: Goal: Knowledge of General Education information will improve Description: Including pain rating scale, medication(s)/side effects and non-pharmacologic comfort measures Outcome: Progressing   Problem: Health Behavior/Discharge Planning: Goal: Ability to manage health-related needs will improve Outcome: Progressing   Problem: Clinical Measurements: Goal: Ability to maintain clinical measurements within normal limits will improve Outcome: Progressing Goal: Will remain free from infection Outcome: Progressing Goal: Diagnostic test results will improve Outcome: Progressing Goal: Respiratory complications will improve Outcome: Progressing Goal: Cardiovascular complication will be avoided Outcome: Progressing   Problem: Activity: Goal: Risk for activity intolerance will decrease Outcome: Progressing   Problem: Nutrition: Goal: Adequate nutrition will be maintained Outcome: Progressing   Problem: Coping: Goal: Level of anxiety will decrease Outcome: Progressing   Problem: Elimination: Goal: Will not experience complications related to bowel motility Outcome: Progressing Goal: Will not experience complications related to urinary retention Outcome: Progressing   Problem: Pain Management: Goal: General experience of comfort will improve Outcome: Progressing   Problem: Safety: Goal: Ability to remain free from injury will improve Outcome: Progressing   Problem: Skin Integrity: Goal: Risk for impaired skin integrity will decrease Outcome: Progressing

## 2023-05-30 NOTE — ED Provider Notes (Signed)
[place] EMERGENCY DEPARTMENT AT [facility] Provider Note  CSN: [id number] Arrival date & time: 05/29/23 1734  Chief Complaint(s) Emesis  HPI Alyssa Carey is a 52 y.o. female with PMH necrotizing fasciitis status post diverting colostomy in June 2021 complicated by parastomal hernia, SBO, previous GI bleed secondary to peptic ulcer disease, alcohol use disorder in remission, cirrhosis, hospitalization in July 2024 for upper GI bleed, recent hospitalization in September 2024 for additional GI bleed who presents to the emergency department for evaluation of right upper quadrant abdominal pain with nausea and vomiting.  States that symptoms have been gradually worsening over the last 3 to 4 days.  Is scheduled for outpatient MRCP and PillCam but has not had these procedures done yet.  Denies associated chest pain, shortness of breath, headache, fever or other systemic symptoms.   Past Medical History Past Medical History:  Diagnosis Date   Alcohol abuse    HTN (hypertension) 10/05/2020   Necrotizing fasciitis [facility])    Patient Active Problem List   Diagnosis Date Noted   GI bleed 04/04/2023   Melena 04/04/2023   Skin ulcer of sacrum (HCC) 04/04/2023   Common bile duct dilation 03/10/2023   Grade II hemorrhoids 03/10/2023   Diverticulosis of colon without hemorrhage 03/10/2023   Noninfectious gastroenteritis 03/10/2023   Gastritis and gastroduodenitis 03/10/2023   History of colostomy reversal 03/09/2023   Blood in stool 01/16/2023   Nausea & vomiting 01/16/2023   Abnormal liver function 01/16/2023   Protein calorie malnutrition (HCC) 01/16/2023   Chronic diarrhea 12/05/2022   Parainfluenza infection 11/13/2022   Bronchiolitis 11/13/2022   Alcohol abuse 11/13/2022   Reactive airway disease 11/13/2022   Hyponatremia 11/10/2022   Obesity, Class III, BMI 40-49.9 (morbid obesity) (HCC) 01/29/2022   Alcohol withdrawal (HCC) 01/29/2022   Colostomy status (HCC)  01/26/2022   Diarrhea with dehydration 09/03/2021   Hypomagnesemia 09/02/2021   Paroxysmal SVT (supraventricular tachycardia) (HCC) 08/24/2021   Atrial flutter (HCC)    SBO (small bowel obstruction) (HCC)    Malnutrition of moderate degree 08/17/2021   Dehydration 08/10/2021   Swelling of lower extremity 04/21/2021   Normocytic anemia 04/09/2021   Hypokalemia 03/24/2021   Hypoalbuminemia due to protein-calorie malnutrition (HCC) 03/24/2021   Hypoglycemia 03/24/2021   Obesity (BMI 30-39.9) 03/24/2021   Serum total bilirubin elevated 03/24/2021   Anemia 03/24/2021   Prolonged QT interval 03/24/2021   Acute hepatitis    Alcohol use    Abnormal liver enzymes 03/23/2021   Peptic ulcer disease 12/02/2020   Acute blood loss anemia 10/06/2020   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CC)    Elevated alkaline phosphatase level    Abdominal pain    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    Gastrointestinal hemorrhage 10/05/2020   Transaminitis 10/05/2020   HTN (hypertension) 10/05/2020   Mild protein malnutrition (HCC) 10/05/2020   Stercoral ulcer of rectum 01/12/2020   Aphonia 01/12/2020   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   Vocal fold paresis, 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   Hypernatremia 01/12/2020   Anasarca 01/12/2020   Necrotizing fasciitis of pelvic region and thigh (HCC) 12/28/2019   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 Authorizing Provider  cholestyramine (QUESTRAN) 4 g packet Take 1 packet (4 g total) by mouth daily  as needed (diarrhea). 04/06/23   Catarina Hartshorn, MD  ferrous sulfate 325 (65 FE) MG EC tablet Take 325 mg by mouth daily with breakfast.    [provider]  fluticasone (FLONASE) 50 MCG/ACT nasal spray Place 2 sprays into both nostrils as needed for allergies or rhinitis. 09/15/22   [provider]  folic acid (FOLVITE) 1 MG tablet Take 1 tablet (1 mg total) by mouth daily. 04/07/23   Catarina Hartshorn, MD  gabapentin (NEURONTIN) 100 MG capsule Take 1 capsule (100 mg total) by mouth at bedtime as needed (pain). 11/13/22   Vassie Loll, MD  hydrocortisone (ANUSOL-HC) 2.5 % rectal cream Place 1 Application rectally 2 (two) times daily. Patient taking differently: Place 1 Application rectally as needed for hemorrhoids or anal itching. 12/05/22   Gelene Mink, NP  hydrOXYzine (ATARAX) 25 MG tablet Take 25 mg by mouth as needed for itching. 09/28/22   [provider]  loratadine (CLARITIN) 10 MG tablet Take 1 tablet (10 mg total) by mouth daily. Patient taking differently: Take 10 mg by mouth daily as needed for allergies. 11/14/22   Vassie Loll, MD  metoprolol tartrate (LOPRESSOR) 25 MG tablet Take 0.5 tablets (12.5 mg total) by mouth 2 (two) times daily. Patient not taking: Reported on 04/25/2023 04/06/23   Catarina Hartshorn, MD  montelukast (SINGULAIR) 10 MG tablet Take 10 mg by mouth at bedtime. 09/19/22   [provider]  Nystatin (GERHARDT'S BUTT CREAM) CREA Apply 1 Application topically 4 (four) times daily as needed for irritation. 11/13/22   Vassie Loll, MD  omeprazole (PRILOSEC) 40 MG capsule Take 1 capsule (40 mg total) by mouth 2 (two) times daily. 04/06/23   Catarina Hartshorn, MD  sucralfate (CARAFATE) 1 GM/10ML suspension Take 10 mLs (1 g total) by mouth 4 (four) times daily -  with meals and at bedtime. 04/06/23   Catarina Hartshorn, MD                                                                                                                                    Past Surgical History Past  Surgical History:  Procedure Laterality Date   BIOPSY  10/07/2020   Procedure: BIOPSY;  Surgeon: Corbin Ade, MD;  [facility];  Service: Endoscopy;;   BIOPSY  03/10/2023   Procedure: BIOPSY;  Surgeon: Franky Macho, MD;  [facility]: [facility];  Service: Endoscopy;;   COLONOSCOPY WITH PROPOFOL N/A 10/07/2020   single healing rectal ulcer in distal rectum with surrounding mucosal friability and markedly abnormal proximal colon with ulceration s/p biopsies.   COLONOSCOPY WITH PROPOFOL  02/22/2021   Procedure: COLONOSCOPY WITH PROPOFOL;  Surgeon: Lanelle Bal, DO;  [facility]: [facility];  Service: Endoscopy;;   COLONOSCOPY WITH PROPOFOL N/A 03/10/2023   Procedure: COLONOSCOPY WITH PROPOFOL;  Surgeon: Franky Macho, MD;  [facility]: [facility];  Service: Endoscopy;  Laterality: N/A;   COLOSTOMY TAKEDOWN N/A 01/26/2022   Procedure: LAPAROSCOPIC COLOSTOMY TAKEDOWN;  Surgeon: Romie Levee, MD;  Location: [facility];  Service: General;  Laterality: N/A;  laparoscopic assisted colostomy reversal and partial colectomy   ESOPHAGOGASTRODUODENOSCOPY (EGD) WITH PROPOFOL N/A 10/07/2020   non-bleeding gastric ulcer . Pathology with H.pylori negative   ESOPHAGOGASTRODUODENOSCOPY (EGD) WITH PROPOFOL N/A 12/27/2020   Procedure: ESOPHAGOGASTRODUODENOSCOPY (EGD) WITH PROPOFOL;  Surgeon: Lanelle Bal, DO;  [facility]: [facility];  Service: Endoscopy;  Laterality: N/A;  1:00pm   ESOPHAGOGASTRODUODENOSCOPY (EGD) WITH PROPOFOL N/A 01/17/2023   Procedure: ESOPHAGOGASTRODUODENOSCOPY (EGD) WITH PROPOFOL;  Surgeon: Corbin Ade, MD;  [facility];  Service: Endoscopy;  Laterality: N/A;   ESOPHAGOGASTRODUODENOSCOPY (EGD) WITH PROPOFOL N/A 03/10/2023   Procedure: ESOPHAGOGASTRODUODENOSCOPY (EGD) WITH PROPOFOL;  Surgeon: Franky Macho, MD;  [facility];  Service: Endoscopy;  Laterality: N/A;   FLEXIBLE SIGMOIDOSCOPY N/A 01/11/2020   Procedure: FLEXIBLE  SIGMOIDOSCOPY;  Surgeon: Jeani Hawking, MD;  Location: [facility];  Service: Gastroenterology;  Laterality: N/A;   INCISION AND DRAINAGE PERIRECTAL ABSCESS N/A 12/25/2019   Procedure: IRRIGATION AND DEBRIDEMENT BUTTOCKS, LAP LOOP COLOSTOMY;  Surgeon: Gaynelle Adu, MD;  Location: [facility];  Service: General;  Laterality: N/A;   IRRIGATION AND DEBRIDEMENT ABSCESS N/A 12/23/2019   Procedure: EXCISION AND DEBRIDEMENT LEFT BUTTOCK AND PERINEUM;  Surgeon: Berna Bue, MD;  Location: [facility];  Service: General;  Laterality: N/A;   LAPAROSCOPIC LOOP COLOSTOMY N/A 12/25/2019   Procedure: LAPAROSCOPIC LOOP COLOSTOMY;  Surgeon: Gaynelle Adu, MD;  Location: [facility];  Service: General;  Laterality: N/A;   POLYPECTOMY  03/10/2023   Procedure: POLYPECTOMY;  Surgeon: Franky Macho, MD;  [facility];  Service: Endoscopy;;   TONSILLECTOMY     Family History Family History  Problem Relation Age of Onset   Colon polyps Mother        does not believe adenomas   Arrhythmia Mother    Colon cancer Neg Hx     Social History Social History   Tobacco Use   Smoking status: Never    Passive exposure: Never   Smokeless tobacco: Never  Vaping Use   Vaping status: Never Used  Substance Use Topics   Alcohol use: Not Currently   Drug use: Not Currently   Allergies Pantoprazole, Penicillins, and Oxycodone hcl  Review of Systems Review of Systems  Gastrointestinal:  Positive for abdominal pain, nausea and vomiting.    Physical Exam Vital Signs  I have reviewed the triage vital signs BP 109/84   Pulse (!) 116   Temp 98.6 F (37 C) (Oral)   Resp 20   Ht 4\' 9"  (1.448 m)   Wt 74.8 kg   SpO2 99%   BMI 35.66 kg/m   Physical Exam Vitals and nursing note reviewed.  Constitutional:      General: She is not in acute distress.    Appearance: She is well-developed.  HENT:     Head: Normocephalic and atraumatic.  Eyes:     Conjunctiva/sclera: Conjunctivae normal.  Cardiovascular:      Rate and Rhythm: Normal rate and regular rhythm.     Heart sounds: No murmur heard. Pulmonary:     Effort: Pulmonary effort is normal. No respiratory distress.  Breath sounds: Normal breath sounds.  Abdominal:     Palpations: Abdomen is soft.     Tenderness: There is abdominal tenderness.  Musculoskeletal:        General: No swelling.     Cervical back: Neck supple.  Skin:    General: Skin is warm and dry.     Capillary Refill: Capillary refill takes less than 2 seconds.  Neurological:     Mental Status: She is alert.  Psychiatric:        Mood and Affect: Mood normal.     ED Results and Treatments Labs (all labs ordered are listed, but only abnormal results are displayed) Labs Reviewed  COMPREHENSIVE METABOLIC PANEL - Abnormal; Notable for the following components:      Result Value   Sodium 129 (*)    Potassium 3.3 (*)    Chloride 91 (*)    CO2 18 (*)    BUN <5 (*)    Calcium 8.7 (*)    Albumin 3.0 (*)    AST 466 (*)    ALT 135 (*)    Alkaline Phosphatase 467 (*)    Total Bilirubin 4.6 (*)    Anion gap 20 (*)    All other components within normal limits  CBC - Abnormal; Notable for the following components:   RBC 3.50 (*)    Hemoglobin 10.7 (*)    HCT 32.6 (*)    RDW 15.7 (*)    Platelets 127 (*)    All other components within normal limits  ACETAMINOPHEN LEVEL - Abnormal; Notable for the following components:   Acetaminophen (Tylenol), Serum <10 (*)    All other components within normal limits  LIPASE, BLOOD  URINALYSIS, ROUTINE W REFLEX MICROSCOPIC  HEPATITIS PANEL, ACUTE  POC URINE PREG, ED                                                                                                                          Radiology CT ABDOMEN PELVIS WO CONTRAST  Result Date: 05/30/2023 CLINICAL DATA:  Epigastric pain. EXAM: CT ABDOMEN AND PELVIS WITHOUT CONTRAST TECHNIQUE: Multidetector CT imaging of the abdomen and pelvis was performed following the standard  protocol without IV contrast. RADIATION DOSE REDUCTION: This exam was performed according to the departmental dose-optimization program which includes automated exposure control, adjustment of the mA and/or kV according to patient size and/or use of iterative reconstruction technique. COMPARISON:  March 09, 2023 FINDINGS: Lower chest: No acute abnormality. Hepatobiliary: The liver is enlarged with diffuse fatty infiltration of the liver parenchyma. No focal liver abnormality is seen. A solitary 4 mm gallstone is seen without evidence of gallbladder wall thickening, pericholecystic inflammation or biliary dilatation. Pancreas: Unremarkable. No pancreatic ductal dilatation or surrounding inflammatory changes. Spleen: Normal in size without focal abnormality. Adrenals/Urinary Tract: Adrenal glands are unremarkable. Kidneys are normal, without renal calculi, focal lesion, or hydronephrosis. Bladder is unremarkable. Stomach/Bowel: Stomach is within normal limits. Appendix appears normal. Surgically anastomosed bowel is  seen within the junction of the descending and sigmoid colon. No evidence of bowel wall thickening, distention, or inflammatory changes. Vascular/Lymphatic: No significant vascular findings are present. No enlarged abdominal or pelvic lymph nodes. Reproductive: Uterus and bilateral adnexa are unremarkable. Other: A 1.5 cm x 2.6 cm x 2.2 cm fat containing ventral hernia is seen along the midline of the mid to upper abdomen. No abdominopelvic ascites. Musculoskeletal: A stable appearing postoperative soft tissue deformity is seen along the posterior perineum and perianal region, to the left of midline. IMPRESSION: 1. Hepatomegaly and hepatic steatosis. 2. Cholelithiasis. 3. Small fat containing ventral hernia. 4. Stable appearing postoperative soft tissue deformity along the posterior perineum and perianal region, to the left of midline. Electronically Signed   By: Aram Candela M.D.   On: 05/30/2023  02:54    Pertinent labs & imaging results that were available during my care of the patient were reviewed by me and considered in my medical decision making (see MDM for details).  Medications Ordered in ED Medications  traZODone (DESYREL) tablet 50 mg (has no administration in time range)  iohexol (OMNIPAQUE) 300 MG/ML solution 100 mL (100 mLs Intravenous Contrast Given 05/30/23 0023)  morphine (PF) 4 MG/ML injection 4 mg (4 mg Intravenous Given 05/30/23 0018)  sodium chloride 0.9 % bolus 1,000 mL (0 mLs Intravenous Stopped 05/30/23 0339)  morphine (PF) 4 MG/ML injection 4 mg (4 mg Intravenous Given 05/30/23 0142)  lactated ringers bolus 1,000 mL (1,000 mLs Intravenous New Bag/Given 05/30/23 0340)                                                                                                                                     Procedures Procedures  (including critical care time)  Medical Decision Making / ED Course   This patient presents to the ED for concern of abdominal pain nausea vomiting, this involves an extensive number of treatment options, and is a complaint that carries with it a high risk of complications and morbidity.  The differential diagnosis includes cholecystitis, cholelithiasis/biliary colic, choledocholithiasis, ascending cholangitis, acute hepatitis, pancreatitis, GERD, pneumonia, constipation, nephrolithiasis  MDM: Patient seen emergency room for evaluation of abdominal pain nausea and vomiting.  Physical exam with right upper quadrant tenderness palpation but is otherwise unremarkable.  Laboratory evaluation with hyponatremia to 129, hypokalemia to 3.3, hypochloremia 91, CO2 18, albumin 3.0, rising transaminitis with AST 466, ALT 135, alk phos 467 and rising total bili to 4.6 with an anion gap of 20.  Lipase is normal.  Hemoglobin is improved from previous at 10.7.  Acute hepatitis panel sent and Tylenol levels are normal.  CT abdomen pelvis showing hepatomegaly  with hepatic steatosis, cholelithiasis and a stable appearing postoperative soft tissue deformity along the posterior perineum and perianal region.  Given rising transaminitis, rising total bili and persistent right upper quadrant pain with nausea and vomiting do have concern for choledocholithiasis and MRCP ordered.  I sent a message to the on-call gastroenterologist Dr. Marletta Lor to have the inpatient GI team see the patient while inpatient.  Patient has had persistently soft blood pressures requiring 2 L of fluid here in the emergency department and tachycardia remains persistent.  She require hospital admission for persistent abdominal pain nausea vomiting requiring MRCP.  Patient admitted   Additional history obtained:  -External records from outside source obtained and reviewed including: Chart review including previous notes, labs, imaging, consultation notes   Lab Tests: -I ordered, reviewed, and interpreted labs.   The pertinent results include:   Labs Reviewed  COMPREHENSIVE METABOLIC PANEL - Abnormal; Notable for the following components:      Result Value   Sodium 129 (*)    Potassium 3.3 (*)    Chloride 91 (*)    CO2 18 (*)    BUN <5 (*)    Calcium 8.7 (*)    Albumin 3.0 (*)    AST 466 (*)    ALT 135 (*)    Alkaline Phosphatase 467 (*)    Total Bilirubin 4.6 (*)    Anion gap 20 (*)    All other components within normal limits  CBC - Abnormal; Notable for the following components:   RBC 3.50 (*)    Hemoglobin 10.7 (*)    HCT 32.6 (*)    RDW 15.7 (*)    Platelets 127 (*)    All other components within normal limits  ACETAMINOPHEN LEVEL - Abnormal; Notable for the following components:   Acetaminophen (Tylenol), Serum <10 (*)    All other components within normal limits  LIPASE, BLOOD  URINALYSIS, ROUTINE W REFLEX MICROSCOPIC  HEPATITIS PANEL, ACUTE  POC URINE PREG, ED      Imaging Studies ordered: I ordered imaging studies including CTAP I independently  visualized and interpreted imaging. I agree with the radiologist interpretation  Right upper quadrant ultrasound and MRCP pending   Medicines ordered and prescription drug management: Meds ordered this encounter  Medications   iohexol (OMNIPAQUE) 300 MG/ML solution 100 mL   morphine (PF) 4 MG/ML injection 4 mg   DISCONTD: lactated ringers bolus 1,000 mL   sodium chloride 0.9 % bolus 1,000 mL   morphine (PF) 4 MG/ML injection 4 mg   lactated ringers bolus 1,000 mL   traZODone (DESYREL) tablet 50 mg    -I have reviewed the patients home medicines and have made adjustments as needed  Critical interventions one  Consultations Obtained: I requested consultation with the gastroenterologist on-call Dr. Marletta Lor,  and discussed lab and imaging findings as well as pertinent plan - they recommend: Inpatient evaluation   Cardiac Monitoring: The patient was maintained on a cardiac monitor.  I personally viewed and interpreted the cardiac monitored which showed an underlying rhythm of: Sinus tachycardia  Social Determinants of Health:  Factors impacting patients care include: Previous history of alcohol abuse, has been in remission   Reevaluation: After the interventions noted above, I reevaluated the patient and found that they have :improved  Co morbidities that complicate the patient evaluation  Past Medical History:  Diagnosis Date   Alcohol abuse    HTN (hypertension) 10/05/2020   Necrotizing fasciitis (HCC)       Dispostion: I considered admission for this patient, and given persistent abdominal pain nausea and vomiting requiring MRCP patient require hospital admission     Final Clinical Impression(s) / ED Diagnoses Final diagnoses:  Transaminitis  RUQ abdominal pain  Vomiting, unspecified vomiting type, unspecified whether  nausea present     @PCDICTATION @    Glendora Score, MD 05/30/23 [id number]

## 2023-05-30 NOTE — ED Notes (Signed)
Pt IV infiltrated in CT, IV removed, ice applied, extremity elevated

## 2023-05-30 NOTE — Consult Note (Addendum)
Gastroenterology Consult   Referring Provider: No ref. provider found Primary Care Physician:  Richardean Chimera, MD Primary Gastroenterologist:  Dr. Marletta Lor   Patient ID: Alyssa Carey; [id number]; 09-11-1970   Admit date: 05/29/2023  LOS: 0 days   Date of Consultation: 05/30/2023  Reason for Consultation:  elevated LFTs, RUQ pain, nausea   History of Present Illness   Alyssa Carey is a 52 y.o. year old female with history of hypertension, alcohol abuse, necrotizing fasciitis status post diverting colostomy in June 2021, peptic ulcer disease with 2 GI bleeds in August 2024, hepatic cytosis with concern for early cirrhosis, recent hospitalization in September 2024 for additional GI bleed who presented to the ED this morning with right upper quadrant pain, nausea, vomiting.  Also found to have significantly elevated LFTs.  GI consulted for further evaluation.  ED course: Hemoglobin 10.7 AST 466, ALT 135, alk phos 467, T bilirubin 4.6, albumin 3 K+ 3.3, sodium 129 Acute hep panel pending, Tylenol level negative  CT A/P with Hepatomegaly and hepatic steatosis. Cholelithiasis.  Right upper quadrant ultrasound without cholelithiasis or cholecystitis, increased hepatic parenchymal echogenicity suggestive of steatosis, CBD markedly dilated to 10 mm notably up from 3 mm in September  Patient was ordered for MRCP however she refused to remove metal clips from her hair therefore imaging was canceled  Consult: Patient states she has been having decreased appetite, diarrhea, nausea and vomiting x1 week. Notes she has been having upper abdominal pain, more on the right side. Eating does not worsen her pain but she has more nausea when she eats. She was supposed to have outpatient MRCP but states she missed the appointment.   She reports last alcohol intake was a year or 2 ago.   Denies rectal bleeding or melena. She reports she was supposed to have a capsule endoscopy but this has not yet  been scheduled.  Patient states that she refused the MRCP earlier as she is refusing to take her hair down.   Last EGD 04/2023 Patient underwent upper endoscopy on 04/20/2023 by Dr. Christoper Allegra found to have clean-based pyloric ulcer 13 mm Biopsies negative H.Pylori  EGD 02/2023:- Normal esophagus.                           - Z-line regular.                           - Non-bleeding gastric ulcer with a clean ulcer                            base (Forrest Class III). Biopsied.                           - Normal duodenal bulb and second portion of the                            duodenum. Last Colon 02/2023;- The examined portion of the ileum was normal.                           - A few erosions in the sigmoid colon. Biopsied.                           -  Diverticulosis in the left colon. There was no                            evidence of diverticular bleeding.                           - Erythematous mucosa in the rectum. Biopsied.                           - One 3 mm polyp in the descending colon, removed                            with a cold biopsy forceps. Resected and retrieved.                           - Non-bleeding external and internal hemorrhoids.                           -Sacral decubitus stage 2 or stage 3 with clear                            Discharge   Past Medical History:  Diagnosis Date   Alcohol abuse    HTN (hypertension) 10/05/2020   Necrotizing fasciitis [facility])     Past Surgical History:  Procedure Laterality Date   BIOPSY  10/07/2020   Procedure: BIOPSY;  Surgeon: Corbin Ade, MD;  [facility];  Service: Endoscopy;;   BIOPSY  03/10/2023   Procedure: BIOPSY;  Surgeon: Franky Macho, MD;  [facility];  Service: Endoscopy;;   COLONOSCOPY WITH PROPOFOL N/A 10/07/2020   single healing rectal ulcer in distal rectum with surrounding mucosal friability and markedly abnormal proximal colon with ulceration s/p biopsies.   COLONOSCOPY WITH  PROPOFOL  02/22/2021   Procedure: COLONOSCOPY WITH PROPOFOL;  Surgeon: Lanelle Bal, DO;  [facility];  Service: Endoscopy;;   COLONOSCOPY WITH PROPOFOL N/A 03/10/2023   Procedure: COLONOSCOPY WITH PROPOFOL;  Surgeon: Franky Macho, MD;  [facility];  Service: Endoscopy;  Laterality: N/A;   COLOSTOMY TAKEDOWN N/A 01/26/2022   Procedure: LAPAROSCOPIC COLOSTOMY TAKEDOWN;  Surgeon: Romie Levee, MD;  [facility]: [facility];  Service: General;  Laterality: N/A;  laparoscopic assisted colostomy reversal and partial colectomy   ESOPHAGOGASTRODUODENOSCOPY (EGD) WITH PROPOFOL N/A 10/07/2020   non-bleeding gastric ulcer . Pathology with H.pylori negative   ESOPHAGOGASTRODUODENOSCOPY (EGD) WITH PROPOFOL N/A 12/27/2020   Procedure: ESOPHAGOGASTRODUODENOSCOPY (EGD) WITH PROPOFOL;  Surgeon: Lanelle Bal, DO;  [facility];  Service: Endoscopy;  Laterality: N/A;  1:00pm   ESOPHAGOGASTRODUODENOSCOPY (EGD) WITH PROPOFOL N/A 01/17/2023   Procedure: ESOPHAGOGASTRODUODENOSCOPY (EGD) WITH PROPOFOL;  Surgeon: Corbin Ade, MD;  [facility];  Service: Endoscopy;  Laterality: N/A;   ESOPHAGOGASTRODUODENOSCOPY (EGD) WITH PROPOFOL N/A 03/10/2023   Procedure: ESOPHAGOGASTRODUODENOSCOPY (EGD) WITH PROPOFOL;  Surgeon: Franky Macho, MD;  [facility];  Service: Endoscopy;  Laterality: N/A;   FLEXIBLE SIGMOIDOSCOPY N/A 01/11/2020   Procedure: FLEXIBLE SIGMOIDOSCOPY;  Surgeon: Jeani Hawking, MD;  [facility];  Service: Gastroenterology;  Laterality: N/A;   INCISION AND DRAINAGE PERIRECTAL ABSCESS N/A 12/25/2019   Procedure: IRRIGATION AND DEBRIDEMENT BUTTOCKS, LAP LOOP  COLOSTOMY;  Surgeon: Gaynelle Adu, MD;  Location: [facility];  Service: General;  Laterality: N/A;   IRRIGATION AND DEBRIDEMENT ABSCESS N/A 12/23/2019   Procedure: EXCISION AND DEBRIDEMENT LEFT BUTTOCK AND PERINEUM;  Surgeon: Berna Bue, MD;  Location: [facility];  Service: General;   Laterality: N/A;   LAPAROSCOPIC LOOP COLOSTOMY N/A 12/25/2019   Procedure: LAPAROSCOPIC LOOP COLOSTOMY;  Surgeon: Gaynelle Adu, MD;  Location: [facility];  Service: General;  Laterality: N/A;   POLYPECTOMY  03/10/2023   Procedure: POLYPECTOMY;  Surgeon: Franky Macho, MD;  [facility];  Service: Endoscopy;;   TONSILLECTOMY      Prior to Admission medications   Medication Sig Start Date End Date Taking? Authorizing Provider  cholestyramine (QUESTRAN) 4 g packet Take 1 packet (4 g total) by mouth daily as needed (diarrhea). 04/06/23   Catarina Hartshorn, MD  ferrous sulfate 325 (65 FE) MG EC tablet Take 325 mg by mouth daily with breakfast.    [provider]  fluticasone (FLONASE) 50 MCG/ACT nasal spray Place 2 sprays into both nostrils as needed for allergies or rhinitis. 09/15/22   [provider]  folic acid (FOLVITE) 1 MG tablet Take 1 tablet (1 mg total) by mouth daily. 04/07/23   Catarina Hartshorn, MD  gabapentin (NEURONTIN) 100 MG capsule Take 1 capsule (100 mg total) by mouth at bedtime as needed (pain). 11/13/22   Vassie Loll, MD  hydrocortisone (ANUSOL-HC) 2.5 % rectal cream Place 1 Application rectally 2 (two) times daily. Patient taking differently: Place 1 Application rectally as needed for hemorrhoids or anal itching. 12/05/22   Gelene Mink, NP  hydrOXYzine (ATARAX) 25 MG tablet Take 25 mg by mouth as needed for itching. 09/28/22   [provider]  loratadine (CLARITIN) 10 MG tablet Take 1 tablet (10 mg total) by mouth daily. Patient taking differently: Take 10 mg by mouth daily as needed for allergies. 11/14/22   Vassie Loll, MD  metoprolol tartrate (LOPRESSOR) 25 MG tablet Take 0.5 tablets (12.5 mg total) by mouth 2 (two) times daily. Patient not taking: Reported on 04/25/2023 04/06/23   Catarina Hartshorn, MD  montelukast (SINGULAIR) 10 MG tablet Take 10 mg by mouth at bedtime. 09/19/22   [provider]  Nystatin (GERHARDT'S BUTT CREAM) CREA Apply 1  Application topically 4 (four) times daily as needed for irritation. 11/13/22   Vassie Loll, MD  omeprazole (PRILOSEC) 40 MG capsule Take 1 capsule (40 mg total) by mouth 2 (two) times daily. 04/06/23   Catarina Hartshorn, MD  sucralfate (CARAFATE) 1 GM/10ML suspension Take 10 mLs (1 g total) by mouth 4 (four) times daily -  with meals and at bedtime. 04/06/23   Catarina Hartshorn, MD    Current Facility-Administered Medications  Medication Dose Route Frequency Provider Last Rate Last Admin   0.9 %  sodium chloride infusion   Intravenous PRN Mariea Clonts, Courage, MD       bisacodyl (DULCOLAX) suppository 10 mg  10 mg Rectal Daily PRN Emokpae, Courage, MD       gabapentin (NEURONTIN) capsule 100 mg  100 mg Oral QHS PRN Emokpae, Courage, MD       heparin injection 5,000 Units  5,000 Units Subcutaneous Q8H Emokpae, Courage, MD   5,000 Units at 05/30/23 0542   hydrOXYzine (ATARAX) tablet 25 mg  25 mg Oral PRN Shon Hale, MD   25 mg at 05/30/23 1035   ibuprofen (ADVIL) tablet 400 mg  400 mg Oral Q6H PRN Shon Hale, MD  magnesium sulfate IVPB 4 g 100 mL  4 g Intravenous Once Emokpae, Courage, MD       montelukast (SINGULAIR) tablet 10 mg  10 mg Oral QHS Emokpae, Courage, MD       morphine (PF) 2 MG/ML injection 2 mg  2 mg Intravenous Q2H PRN Emokpae, Courage, MD   2 mg at 05/30/23 1040   ondansetron (ZOFRAN) tablet 4 mg  4 mg Oral Q6H PRN Emokpae, Courage, MD       Or   ondansetron (ZOFRAN) injection 4 mg  4 mg Intravenous Q6H PRN Mariea Clonts, Courage, MD   4 mg at 05/30/23 0823   pantoprazole (PROTONIX) injection 40 mg  40 mg Intravenous Q24H Emokpae, Courage, MD       polyethylene glycol (MIRALAX / GLYCOLAX) packet 17 g  17 g Oral Daily PRN Emokpae, Courage, MD       potassium chloride 10 mEq in 100 mL IVPB  10 mEq Intravenous Q1 Hr x 4 Emokpae, Courage, MD       potassium chloride SA (KLOR-CON M) CR tablet 40 mEq  40 mEq Oral Q3H Emokpae, Courage, MD   40 mEq at 05/30/23 1035   sodium chloride flush  (NS) 0.9 % injection 3 mL  3 mL Intravenous Q12H Emokpae, Courage, MD       sodium chloride flush (NS) 0.9 % injection 3 mL  3 mL Intravenous Q12H Emokpae, Courage, MD       sodium chloride flush (NS) 0.9 % injection 3 mL  3 mL Intravenous PRN Emokpae, Courage, MD       traZODone (DESYREL) tablet 50 mg  50 mg Oral QHS Emokpae, Courage, MD   50 mg at 05/30/23 0423    Allergies as of 05/29/2023 - Review Complete 05/29/2023  Allergen Reaction Noted   Pantoprazole  02/17/2021   Penicillins Hives 12/21/2019   Oxycodone hcl Rash 10/06/2020    Review of Systems   Gen: Denies any fever, chills, loss of appetite, change in weight or weight loss CV: Denies chest pain, heart palpitations, syncope, edema  Resp: Denies shortness of breath with rest, cough, wheezing, coughing up blood, and pleurisy. GI: Denies vomiting blood, jaundice, and fecal incontinence.   Denies dysphagia or odynophagia. GU : Denies urinary burning, blood in urine, urinary frequency, and urinary incontinence. MS: Denies joint pain, limitation of movement, swelling, cramps, and atrophy.  Derm: Denies rash, itching, dry skin, hives. Psych: Denies depression, anxiety, memory loss, hallucinations, and confusion. Heme: Denies bruising or bleeding Neuro:  Denies any headaches, dizziness, paresthesias, shaking  Physical Exam   Vital Signs in last 24 hours: Temp:  [98.4 F (36.9 C)-99.2 F (37.3 C)] 98.4 F (36.9 C) (11/13 0852) Pulse Rate:  [98-126] 124 (11/13 0852) Resp:  [16-26] 20 (11/13 0852) BP: (90-109)/(63-84) 106/83 (11/13 0852) SpO2:  [97 %-100 %] 100 % (11/13 0852) Weight:  [74.8 kg-76.7 kg] 76.7 kg (11/13 0852)    General:   Alert,  Well-developed, well-nourished, pleasant and cooperative in NAD Head:  Normocephalic and atraumatic. Eyes:  Sclera clear, no icterus.   Conjunctiva pink. Ears:  Normal auditory acuity. Mouth:  No deformity or lesions, dentition normal. Neck:  Supple; no masses Lungs:  Clear  throughout to auscultation.   No wheezes, crackles, or rhonchi. No acute distress. Heart:  Regular rate and rhythm; no murmurs, clicks, rubs,  or gallops. Abdomen:  Soft, nontender and nondistended. No masses, hepatosplenomegaly or hernias noted. Normal bowel sounds, without guarding, and without rebound.  Msk:  Symmetrical without gross deformities. Normal posture. Extremities:  Without clubbing or edema. Neurologic:  Alert and  oriented x4. Skin:  Intact without significant lesions or rashes. Psych:  Alert and cooperative. Normal mood and affect.   Labs/Studies   Recent Labs Recent Labs    05/29/23 1901 05/30/23 0507  WBC 4.9 4.9  HGB 10.7* 8.5*  HCT 32.6* 25.8*  PLT 127* 109*   BMET Recent Labs    05/29/23 1901 05/30/23 0507  NA 129* 130*  K 3.3* 2.9*  CL 91* 101  CO2 18* 18*  GLUCOSE 88 88  BUN <5* <5*  CREATININE 0.49 0.50  CALCIUM 8.7* 7.7*   LFT Recent Labs    05/29/23 1901 05/30/23 0507  PROT 8.0 6.5  ALBUMIN 3.0* 2.3*  AST 466* 319*  ALT 135* 105*  ALKPHOS 467* 358*  BILITOT 4.6* 4.1*    Radiology/Studies US Abdomen Limited RUQ (LIVER/GB)  Result Date: 05/30/2023 CLINICAL DATA:  Right upper quadrant pain EXAM: ULTRASOUND ABDOMEN LIMITED RIGHT UPPER QUADRANT COMPARISON:  MR abdomen 05/30/2023 FINDINGS: Gallbladder: No gallstones or wall thickening visualized. No sonographic Murphy sign noted by sonographer. Common bile duct: Diameter: 10 mm, dilated Liver: Increased echogenicity. No focal lesion. Portal vein is patent on color Doppler imaging with normal direction of blood flow towards the liver. Other: None. IMPRESSION: 1. No cholelithiasis or sonographic evidence for acute cholecystitis. 2. Increased hepatic parenchymal echogenicity suggestive of steatosis. 3. Common bile duct markedly dilated, correlate with LFTs. If signs of obstruction, recommend MRI/MRCP. Electronically Signed   By: Annia Belt M.D.   On: 05/30/2023 08:49   CT ABDOMEN PELVIS WO  CONTRAST  Result Date: 05/30/2023 CLINICAL DATA:  Epigastric pain. EXAM: CT ABDOMEN AND PELVIS WITHOUT CONTRAST TECHNIQUE: Multidetector CT imaging of the abdomen and pelvis was performed following the standard protocol without IV contrast. RADIATION DOSE REDUCTION: This exam was performed according to the departmental dose-optimization program which includes automated exposure control, adjustment of the mA and/or kV according to patient size and/or use of iterative reconstruction technique. COMPARISON:  March 09, 2023 FINDINGS: Lower chest: No acute abnormality. Hepatobiliary: The liver is enlarged with diffuse fatty infiltration of the liver parenchyma. No focal liver abnormality is seen. A solitary 4 mm gallstone is seen without evidence of gallbladder wall thickening, pericholecystic inflammation or biliary dilatation. Pancreas: Unremarkable. No pancreatic ductal dilatation or surrounding inflammatory changes. Spleen: Normal in size without focal abnormality. Adrenals/Urinary Tract: Adrenal glands are unremarkable. Kidneys are normal, without renal calculi, focal lesion, or hydronephrosis. Bladder is unremarkable. Stomach/Bowel: Stomach is within normal limits. Appendix appears normal. Surgically anastomosed bowel is seen within the junction of the descending and sigmoid colon. No evidence of bowel wall thickening, distention, or inflammatory changes. Vascular/Lymphatic: No significant vascular findings are present. No enlarged abdominal or pelvic lymph nodes. Reproductive: Uterus and bilateral adnexa are unremarkable. Other: A 1.5 cm x 2.6 cm x 2.2 cm fat containing ventral hernia is seen along the midline of the mid to upper abdomen. No abdominopelvic ascites. Musculoskeletal: A stable appearing postoperative soft tissue deformity is seen along the posterior perineum and perianal region, to the left of midline. IMPRESSION: 1. Hepatomegaly and hepatic steatosis. 2. Cholelithiasis. 3. Small fat containing  ventral hernia. 4. Stable appearing postoperative soft tissue deformity along the posterior perineum and perianal region, to the left of midline. Electronically Signed   By: Aram Candela M.D.   On: 05/30/2023 02:54     Assessment   Alyssa Carey is a  52 y.o. year old female with complex medical history fhow presented to the ED with RUQ pain, nausea, vomiting and found to have signfiicant elevated LFTs. GI consulted for further evaluation  Elevated LFTs/CBD dilation/n/v/RUQ pain: nausea, vomiting and RUQ pain for the past week. LFTs markedly increased on admission yesterday. She is also endorsing itching as well. She has been recommended to have outpatient MRCP for further evaluation of ongoing LFT elevation which has not yet been completed.  concerningly this admission she has an upward trend in LFTs, and mardkely dilated CBD to 10mm which is new from previous imaging in September, though she has had CBD dilation up to 11mm on previous imaging in august. She was ordered an MRCP for this morning, however, she refused exam as she was requested to take her metal hair pins and and she is not willing to do this as she just got her hair done. I had a thorough discussion with the patient regarding importance of ruling out choledocolithiasis as findings of this may require further intervention and if she does have a stone blocking her bile duct, this could lead to serious adverse outcomes such as cholangitis, sepsis or potentially death. Patient verbalized understanding but continues to refuse MRCP, stating she is willing to return in about 1 week to have this performed.   I discussed case with Dr. Jena Gauss who recommends transfer of paitent to higher level of Care for possible EUS +/- ERCP as deemed necessary, given we cannot rule out choledocolithiasis at this time.   Cirrhosis: compensated   Etiology of cirrhosis is ETOH, though she reports no use in 1-2 years. Autoimmune serologies, hepatitis panel  all negative. She has no signs of HE, ascites, no EVs on upper endoscopic evaluation. Negative AFP in August 2024, no focal liver lesions on recent CT or [place] this admission. Needs continued outpatient cirrhosis care and continued ETOH cessation.   Will update INR today to calculate MELD score  Anemia: hgb 10.7 on admission, down to 8.5 this morning. Denies rectal bleeding or melena currently. Extensive endoscopic workup in the past. She had EGD on 04/20/23 with clean based pyloric ulcer of 13mm.  Will continue with  PPI daily and avoiding all NSAIDs. she was recommended to have capsule endoscopy as outpatient for further evaluation and possible repeat upper endoscopy in 2-3 months pending findings of capsule study. Capsule study is currently in the process of being scheduled.    Plan / Recommendations    MELD labs daily Will update INR PPI daily  4. Outpatient capsule endoscopy 5. Ongoing outpatient cirrhosis care 6. Trend H&H, transfuse for hgb <7 7. Recommend transfer to higher level of care for EUS +/- ERCP given concern for choledocolithiasis and patient refusal of MRCP    05/30/2023, 10:41 AM  Agness Sibrian L. Jeanmarie Hubert, MSN, APRN, AGNP-C Adult-Gerontology Nurse Practitioner [facility] at [facility]  Addendum** per patient's RN, she is now agreeable to proceed with MRCP today. Will reorder MRCP at this time. Further recommendations to follow results of this   INR is 0.9, MELD 3.0 is 19

## 2023-05-30 NOTE — ED Notes (Addendum)
Pt refused MRI as her hair has bobby pins and metal hair clips that she does not want to take out, pt states she just had her hair done x 2 days ago and it was expensive; reports she is willing to have MRI done at a later date outpatient, attending and EDP aware

## 2023-05-30 NOTE — H&P (Signed)
Patient Demographics:    Alyssa Carey, is a 52 y.o. female  MRN: [id number]   DOB - Nov 14, 1970  Admit Date - 05/29/2023  Outpatient Primary MD for the patient is Richardean Chimera, MD   Assessment & Plan:   Assessment and Plan:  1)Transaminitis--??  Etiology Elevated LFTs noted with AST of 466 ALT of 135 alkaline phosphatase of 467 T. bili of 4.6  -No fevers or leukocytosis right upper quadrant ultrasound shows increased hepatic parenchyma suggestive of steatosis, no gallstones or evidence of acute cholecystitisdilated common bile duct MRI/MRCP recommended- -CT abdomen and pelvis suggestive of cholelithiasis, hepatic steatosis -MRI/MRCP pending -GI input appreciated -She may need ERCP pending MRCP findings = As needed antiemetics and pain medications for now   2) hyponatremia/hypokalemia/hypomagnesemia--- in the setting of GI losses -IV fluids as ordered -Replace electrolytes  3) anemia and thrombocytopenia----Hemoglobin 8.5 down from 10.7 yesterday platelets 109 down from 127 , ??  Underlying liver pathology -Patient also with concerns for possible ongoing GI bleed/peptic ulcer disease  4)Class 2 Obesity--- -Low calorie diet, portion control and increase physical activity discussed with patient -Body mass index is 36.57 kg/m.  5)PUD/GERD-- She had EGD on 04/20/23 with clean based pyloric ulcer of 13mm.  continue Protonix -May need outpatient capsule endoscopy  Status is: Inpatient  Remains inpatient appropriate because:   Dispo: The patient is from: Home              Anticipated d/c is to: Home              Anticipated d/c date is: 2 days              Patient currently is not medically stable to d/c. Barriers: Not Clinically Stable-   With History of - Reviewed by me  Past Medical History:   Diagnosis Date   Alcohol abuse    HTN (hypertension) 10/05/2020   Necrotizing fasciitis [facility])       Past Surgical History:  Procedure Laterality Date   BIOPSY  10/07/2020   Procedure: BIOPSY;  Surgeon: Corbin Ade, MD;  [facility];  Service: Endoscopy;;   BIOPSY  03/10/2023   Procedure: BIOPSY;  Surgeon: Franky Macho, MD;  [facility];  Service: Endoscopy;;   COLONOSCOPY WITH PROPOFOL N/A 10/07/2020   single healing rectal ulcer in distal rectum with surrounding mucosal friability and markedly abnormal proximal colon with ulceration s/p biopsies.   COLONOSCOPY WITH PROPOFOL  02/22/2021   Procedure: COLONOSCOPY WITH PROPOFOL;  Surgeon: Lanelle Bal, DO;  [facility];  Service: Endoscopy;;   COLONOSCOPY WITH PROPOFOL N/A 03/10/2023   Procedure: COLONOSCOPY WITH PROPOFOL;  Surgeon: Franky Macho, MD;  [facility];  Service: Endoscopy;  Laterality: N/A;   COLOSTOMY TAKEDOWN N/A 01/26/2022   Procedure: LAPAROSCOPIC COLOSTOMY TAKEDOWN;  Surgeon: Romie Levee, MD;  Location: [facility];  Service: General;  Laterality: N/A;  laparoscopic assisted colostomy reversal  and partial colectomy   ESOPHAGOGASTRODUODENOSCOPY (EGD) WITH PROPOFOL N/A 10/07/2020   non-bleeding gastric ulcer . Pathology with H.pylori negative   ESOPHAGOGASTRODUODENOSCOPY (EGD) WITH PROPOFOL N/A 12/27/2020   Procedure: ESOPHAGOGASTRODUODENOSCOPY (EGD) WITH PROPOFOL;  Surgeon: Lanelle Bal, DO;  [facility];  Service: Endoscopy;  Laterality: N/A;  1:00pm   ESOPHAGOGASTRODUODENOSCOPY (EGD) WITH PROPOFOL N/A 01/17/2023   Procedure: ESOPHAGOGASTRODUODENOSCOPY (EGD) WITH PROPOFOL;  Surgeon: Corbin Ade, MD;  [facility];  Service: Endoscopy;  Laterality: N/A;   ESOPHAGOGASTRODUODENOSCOPY (EGD) WITH PROPOFOL N/A 03/10/2023   Procedure: ESOPHAGOGASTRODUODENOSCOPY (EGD) WITH PROPOFOL;  Surgeon: Franky Macho, MD;  [facility];   Service: Endoscopy;  Laterality: N/A;   FLEXIBLE SIGMOIDOSCOPY N/A 01/11/2020   Procedure: FLEXIBLE SIGMOIDOSCOPY;  Surgeon: Jeani Hawking, MD;  [facility];  Service: Gastroenterology;  Laterality: N/A;   INCISION AND DRAINAGE PERIRECTAL ABSCESS N/A 12/25/2019   Procedure: IRRIGATION AND DEBRIDEMENT BUTTOCKS, LAP LOOP COLOSTOMY;  Surgeon: Gaynelle Adu, MD;  Location: [facility];  Service: General;  Laterality: N/A;   IRRIGATION AND DEBRIDEMENT ABSCESS N/A 12/23/2019   Procedure: EXCISION AND DEBRIDEMENT LEFT BUTTOCK AND PERINEUM;  Surgeon: Berna Bue, MD;  Location: [facility];  Service: General;  Laterality: N/A;   LAPAROSCOPIC LOOP COLOSTOMY N/A 12/25/2019   Procedure: LAPAROSCOPIC LOOP COLOSTOMY;  Surgeon: Gaynelle Adu, MD;  Location: [facility];  Service: General;  Laterality: N/A;   POLYPECTOMY  03/10/2023   Procedure: POLYPECTOMY;  Surgeon: Franky Macho, MD;  [facility];  Service: Endoscopy;;   TONSILLECTOMY     Chief Complaint  Patient presents with   Emesis      HPI:    Alyssa Carey  is a 52 y.o. female with past medical history relevant for necrotizing fasciitis status post diverting colostomy in June 2021 , GERD,, prior GI bleed, history of alcohol abuse with concerns for possible early liver cirrhosis, presenting with right upper quadrant and lower abdominal pain as well as nausea and vomiting -Emesis is without bile or blood No fever  Or chills  No diarrhea -No sick contacts at home -In the ED right upper quadrant ultrasound shows increased hepatic parenchyma suggestive of steatosis, no gallstones or evidence of acute cholecystitisdilated common bile duct MRI/MRCP recommended- -CT abdomen and pelvis suggetive of cholelithiasis, hepatic steatosis -MRI/MRCP pending -Elevated LFTs noted with AST of 466 ALT of 135 alkaline phosphatase of 467 T. bili of 4.6 anion gap of 20, bicarb of 18 serum glucose of 88 sodium of 129 potassium 3.3 now down to 2.9,  creatinine 0.49 -Magnesium 1.5  -Hemoglobin 8.5 down from 10.7 yesterday platelets 109 down from 127 , WBC 4.9 -TSH and PT/INR WNL   Review of systems:    In addition to the HPI above,   A full Review of  Systems was done, all other systems reviewed are negative except as noted above in HPI , .    Social History:  Reviewed by me    Social History   Tobacco Use   Smoking status: Never    Passive exposure: Never   Smokeless tobacco: Never  Substance Use Topics   Alcohol use: Not Currently    Family History :  Reviewed by me    Family History  Problem Relation Age of Onset   Colon polyps Mother        does not believe adenomas   Arrhythmia Mother    Colon cancer Neg Hx      Home Medications:  Prior to Admission medications   Medication Sig Start Date End Date Taking? Authorizing Provider  cholestyramine (QUESTRAN) 4 g packet Take 1 packet (4 g total) by mouth daily as needed (diarrhea). 04/06/23  Yes Tat, Onalee Hua, MD  ferrous sulfate 325 (65 FE) MG EC tablet Take 325 mg by mouth daily with breakfast.   Yes [provider]  folic acid (FOLVITE) 1 MG tablet Take 1 tablet (1 mg total) by mouth daily. 04/07/23  Yes Tat, Onalee Hua, MD  gabapentin (NEURONTIN) 100 MG capsule Take 1 capsule (100 mg total) by mouth at bedtime as needed (pain). 11/13/22  Yes Vassie Loll, MD  hydrOXYzine (ATARAX) 25 MG tablet Take 25 mg by mouth as needed for itching. 09/28/22  Yes [provider]  ascorbic acid (VITAMIN C) 500 MG tablet Take 500 mg by mouth daily as needed.    [provider]  fluticasone (FLONASE) 50 MCG/ACT nasal spray Place 2 sprays into both nostrils as needed for allergies or rhinitis. 09/15/22   [provider]  hydrocortisone (ANUSOL-HC) 2.5 % rectal cream Place 1 Application rectally 2 (two) times daily. Patient taking differently: Place 1 Application rectally as needed for hemorrhoids or anal itching. 12/05/22   Gelene Mink, NP  loratadine  (CLARITIN) 10 MG tablet Take 1 tablet (10 mg total) by mouth daily. Patient taking differently: Take 10 mg by mouth daily as needed for allergies. 11/14/22   Vassie Loll, MD  montelukast (SINGULAIR) 10 MG tablet Take 10 mg by mouth at bedtime. 09/19/22   [provider]  Nystatin (GERHARDT'S BUTT CREAM) CREA Apply 1 Application topically 4 (four) times daily as needed for irritation. 11/13/22   Vassie Loll, MD     Allergies:    Allergies  Allergen Reactions   Pantoprazole     Stomach pain   Penicillins Hives   Oxycodone Hcl Rash     Physical Exam:   Vitals  Blood pressure 95/60, pulse 100, temperature 98.3 F (36.8 C), temperature source Oral, resp. rate 20, height 4\' 9"  (1.448 m), weight 76.7 kg, SpO2 99%.  Physical Examination: General appearance - alert,  in no distress  Mental status - alert, oriented to person, place, and time,  Eyes - sclera icterus Neck - supple, no JVD elevation , Chest - clear  to auscultation bilaterally, symmetrical air movement,  Heart - S1 and S2 normal, regular  Abdomen - soft, nontender, nondistended, +BS, epigastric, right upper quadrant and  lower abdominal tenderness without rebound or guarding Neurological - screening mental status exam normal, neck supple without rigidity, cranial nerves II through XII intact, DTR's normal and symmetric Extremities - no pedal edema noted, intact peripheral pulses  Skin - warm, dry   Data Review:    CBC Recent Labs  Lab 05/29/23 1901 05/30/23 0507  WBC 4.9 4.9  HGB 10.7* 8.5*  HCT 32.6* 25.8*  PLT 127* 109*  MCV 93.1 93.8  MCH 30.6 30.9  MCHC 32.8 32.9  RDW 15.7* 15.7*  LYMPHSABS  --  0.6*  MONOABS  --  0.4  EOSABS  --  0.0  BASOSABS  --  0.0   ------------------------------------------------------------------------------------------------------------------  Chemistries  Recent Labs  Lab 05/29/23 1901 05/30/23 0507  NA 129* 130*  K 3.3* 2.9*  CL 91* 101  CO2 18* 18*   GLUCOSE 88 88  BUN <5* <5*  CREATININE 0.49 0.50  CALCIUM 8.7* 7.7*  MG  --  1.5*  AST 466* 319*  ALT 135* 105*  ALKPHOS 467*  358*  BILITOT 4.6* 4.1*   ------------------------------------------------------------------------------------------------------------------ estimated creatinine clearance is 71.5 mL/min (by C-G formula based on SCr of 0.5 mg/dL). ------------------------------------------------------------------------------------------------------------------ Recent Labs    05/30/23 0507  TSH 4.015   Coagulation profile Recent Labs  Lab 05/30/23 1122  INR 0.9   ---------------------------------------------------------------------------    Component Value Date/Time   BNP 24.0 11/10/2022 1626   Urinalysis    Component Value Date/Time   COLORURINE YELLOW 04/04/2023 1550   APPEARANCEUR HAZY (A) 04/04/2023 1550   LABSPEC 1.014 04/04/2023 1550   PHURINE 6.0 04/04/2023 1550   GLUCOSEU 50 (A) 04/04/2023 1550   HGBUR MODERATE (A) 04/04/2023 1550   BILIRUBINUR NEGATIVE 04/04/2023 1550   KETONESUR 20 (A) 04/04/2023 1550   PROTEINUR NEGATIVE 04/04/2023 1550   NITRITE NEGATIVE 04/04/2023 1550   LEUKOCYTESUR NEGATIVE 04/04/2023 1550    Imaging Results:    US Abdomen Limited RUQ (LIVER/GB)  Result Date: 05/30/2023 CLINICAL DATA:  Right upper quadrant pain EXAM: ULTRASOUND ABDOMEN LIMITED RIGHT UPPER QUADRANT COMPARISON:  MR abdomen 05/30/2023 FINDINGS: Gallbladder: No gallstones or wall thickening visualized. No sonographic Murphy sign noted by sonographer. Common bile duct: Diameter: 10 mm, dilated Liver: Increased echogenicity. No focal lesion. Portal vein is patent on color Doppler imaging with normal direction of blood flow towards the liver. Other: None. IMPRESSION: 1. No cholelithiasis or sonographic evidence for acute cholecystitis. 2. Increased hepatic parenchymal echogenicity suggestive of steatosis. 3. Common bile duct markedly dilated, correlate with LFTs. If  signs of obstruction, recommend MRI/MRCP. Electronically Signed   By: Annia Belt M.D.   On: 05/30/2023 08:49   CT ABDOMEN PELVIS WO CONTRAST  Result Date: 05/30/2023 CLINICAL DATA:  Epigastric pain. EXAM: CT ABDOMEN AND PELVIS WITHOUT CONTRAST TECHNIQUE: Multidetector CT imaging of the abdomen and pelvis was performed following the standard protocol without IV contrast. RADIATION DOSE REDUCTION: This exam was performed according to the departmental dose-optimization program which includes automated exposure control, adjustment of the mA and/or kV according to patient size and/or use of iterative reconstruction technique. COMPARISON:  March 09, 2023 FINDINGS: Lower chest: No acute abnormality. Hepatobiliary: The liver is enlarged with diffuse fatty infiltration of the liver parenchyma. No focal liver abnormality is seen. A solitary 4 mm gallstone is seen without evidence of gallbladder wall thickening, pericholecystic inflammation or biliary dilatation. Pancreas: Unremarkable. No pancreatic ductal dilatation or surrounding inflammatory changes. Spleen: Normal in size without focal abnormality. Adrenals/Urinary Tract: Adrenal glands are unremarkable. Kidneys are normal, without renal calculi, focal lesion, or hydronephrosis. Bladder is unremarkable. Stomach/Bowel: Stomach is within normal limits. Appendix appears normal. Surgically anastomosed bowel is seen within the junction of the descending and sigmoid colon. No evidence of bowel wall thickening, distention, or inflammatory changes. Vascular/Lymphatic: No significant vascular findings are present. No enlarged abdominal or pelvic lymph nodes. Reproductive: Uterus and bilateral adnexa are unremarkable. Other: A 1.5 cm x 2.6 cm x 2.2 cm fat containing ventral hernia is seen along the midline of the mid to upper abdomen. No abdominopelvic ascites. Musculoskeletal: A stable appearing postoperative soft tissue deformity is seen along the posterior perineum and  perianal region, to the left of midline. IMPRESSION: 1. Hepatomegaly and hepatic steatosis. 2. Cholelithiasis. 3. Small fat containing ventral hernia. 4. Stable appearing postoperative soft tissue deformity along the posterior perineum and perianal region, to the left of midline. Electronically Signed   By: Aram Candela M.D.   On: 05/30/2023 02:54    Radiological Exams on Admission: US Abdomen Limited RUQ (LIVER/GB)  Result Date: 05/30/2023 CLINICAL  DATA:  Right upper quadrant pain EXAM: ULTRASOUND ABDOMEN LIMITED RIGHT UPPER QUADRANT COMPARISON:  MR abdomen 05/30/2023 FINDINGS: Gallbladder: No gallstones or wall thickening visualized. No sonographic Murphy sign noted by sonographer. Common bile duct: Diameter: 10 mm, dilated Liver: Increased echogenicity. No focal lesion. Portal vein is patent on color Doppler imaging with normal direction of blood flow towards the liver. Other: None. IMPRESSION: 1. No cholelithiasis or sonographic evidence for acute cholecystitis. 2. Increased hepatic parenchymal echogenicity suggestive of steatosis. 3. Common bile duct markedly dilated, correlate with LFTs. If signs of obstruction, recommend MRI/MRCP. Electronically Signed   By: Annia Belt M.D.   On: 05/30/2023 08:49   CT ABDOMEN PELVIS WO CONTRAST  Result Date: 05/30/2023 CLINICAL DATA:  Epigastric pain. EXAM: CT ABDOMEN AND PELVIS WITHOUT CONTRAST TECHNIQUE: Multidetector CT imaging of the abdomen and pelvis was performed following the standard protocol without IV contrast. RADIATION DOSE REDUCTION: This exam was performed according to the departmental dose-optimization program which includes automated exposure control, adjustment of the mA and/or kV according to patient size and/or use of iterative reconstruction technique. COMPARISON:  March 09, 2023 FINDINGS: Lower chest: No acute abnormality. Hepatobiliary: The liver is enlarged with diffuse fatty infiltration of the liver parenchyma. No focal liver  abnormality is seen. A solitary 4 mm gallstone is seen without evidence of gallbladder wall thickening, pericholecystic inflammation or biliary dilatation. Pancreas: Unremarkable. No pancreatic ductal dilatation or surrounding inflammatory changes. Spleen: Normal in size without focal abnormality. Adrenals/Urinary Tract: Adrenal glands are unremarkable. Kidneys are normal, without renal calculi, focal lesion, or hydronephrosis. Bladder is unremarkable. Stomach/Bowel: Stomach is within normal limits. Appendix appears normal. Surgically anastomosed bowel is seen within the junction of the descending and sigmoid colon. No evidence of bowel wall thickening, distention, or inflammatory changes. Vascular/Lymphatic: No significant vascular findings are present. No enlarged abdominal or pelvic lymph nodes. Reproductive: Uterus and bilateral adnexa are unremarkable. Other: A 1.5 cm x 2.6 cm x 2.2 cm fat containing ventral hernia is seen along the midline of the mid to upper abdomen. No abdominopelvic ascites. Musculoskeletal: A stable appearing postoperative soft tissue deformity is seen along the posterior perineum and perianal region, to the left of midline. IMPRESSION: 1. Hepatomegaly and hepatic steatosis. 2. Cholelithiasis. 3. Small fat containing ventral hernia. 4. Stable appearing postoperative soft tissue deformity along the posterior perineum and perianal region, to the left of midline. Electronically Signed   By: Aram Candela M.D.   On: 05/30/2023 02:54    DVT Prophylaxis -SCD /heparin AM Labs Ordered, also please review Full Orders  Family Communication: Admission, patients condition and plan of care including tests being ordered have been discussed with the patient who indicate understanding and agree with the plan   Condition  -stable  Shon Hale M.D on 05/30/2023 at 6:35 PM Go to [email or web address] -  for contact info  [facility] - Office  [phone]

## 2023-05-30 NOTE — Plan of Care (Signed)

## 2023-05-30 NOTE — ED Notes (Signed)
Pt c/o itching all over. Meds given. [place] at bedside.

## 2023-05-30 NOTE — ED Notes (Signed)
Pt has had no episodes of emesis or diarrhea since 1900

## 2023-05-31 ENCOUNTER — Telehealth: Payer: Self-pay | Admitting: Gastroenterology

## 2023-05-31 DIAGNOSIS — K760 Fatty (change of) liver, not elsewhere classified: Secondary | ICD-10-CM

## 2023-05-31 DIAGNOSIS — R160 Hepatomegaly, not elsewhere classified: Secondary | ICD-10-CM

## 2023-05-31 DIAGNOSIS — R7401 Elevation of levels of liver transaminase levels: Secondary | ICD-10-CM

## 2023-05-31 DIAGNOSIS — K8020 Calculus of gallbladder without cholecystitis without obstruction: Secondary | ICD-10-CM

## 2023-05-31 DIAGNOSIS — K7460 Unspecified cirrhosis of liver: Secondary | ICD-10-CM

## 2023-05-31 DIAGNOSIS — L299 Pruritus, unspecified: Secondary | ICD-10-CM

## 2023-05-31 LAB — GLUCOSE, CAPILLARY
Glucose-Capillary: 132 mg/dL — ABNORMAL HIGH (ref 70–99)
Glucose-Capillary: 145 mg/dL — ABNORMAL HIGH (ref 70–99)
Glucose-Capillary: 148 mg/dL — ABNORMAL HIGH (ref 70–99)
Glucose-Capillary: 156 mg/dL — ABNORMAL HIGH (ref 70–99)
Glucose-Capillary: 193 mg/dL — ABNORMAL HIGH (ref 70–99)

## 2023-05-31 LAB — COMPREHENSIVE METABOLIC PANEL
ALT: 88 U/L — ABNORMAL HIGH (ref 0–44)
AST: 241 U/L — ABNORMAL HIGH (ref 15–41)
Albumin: 2.1 g/dL — ABNORMAL LOW (ref 3.5–5.0)
Alkaline Phosphatase: 301 U/L — ABNORMAL HIGH (ref 38–126)
Anion gap: 10 (ref 5–15)
BUN: <5 mg/dL — ABNORMAL LOW (ref 6–20)
CO2: 23 mmol/L (ref 22–32)
Calcium: 8 mg/dL — ABNORMAL LOW (ref 8.9–10.3)
Chloride: 98 mmol/L (ref 98–111)
Creatinine, Ser: 0.44 mg/dL (ref 0.44–1.00)
GFR, Estimated: >60 mL/min (ref >60)
Glucose, Bld: 134 mg/dL — ABNORMAL HIGH (ref 70–99)
Potassium: 3.9 mmol/L (ref 3.5–5.1)
Sodium: 131 mmol/L — ABNORMAL LOW (ref 135–145)
Total Bilirubin: 3.9 mg/dL — ABNORMAL HIGH (ref <1.2)
Total Protein: 5.8 g/dL — ABNORMAL LOW (ref 6.5–8.1)

## 2023-05-31 MED ORDER — KETOROLAC TROMETHAMINE 15 MG/ML IJ SOLN
15.0000 mg | Freq: Once | INTRAMUSCULAR | Status: AC
  Administered 2023-05-31: 15 mg via INTRAVENOUS
  Filled 2023-05-31: qty 1

## 2023-05-31 MED ORDER — TRAMADOL HCL 50 MG PO TABS
100.0000 mg | ORAL_TABLET | Freq: Once | ORAL | Status: AC
  Administered 2023-05-31: 100 mg via ORAL
  Filled 2023-05-31: qty 2

## 2023-05-31 NOTE — Progress Notes (Signed)
   05/31/23 0626  Assess: MEWS Score  Temp 98.3 F (36.8 C)  BP (!) 98/50  Pulse Rate (!) 120  Resp 20  SpO2 100 %  O2 Device Room Air  Assess: MEWS Score  MEWS Temp 0  MEWS Systolic 1  MEWS Pulse 2  MEWS RR 0  MEWS LOC 0  MEWS Score 3  MEWS Score Color Yellow  Assess: if the MEWS score is Yellow or Red  Were vital signs accurate and taken at a resting state? Yes  Does the patient meet 2 or more of the SIRS criteria? Yes  Does the patient have a confirmed or suspected source of infection? No  MEWS guidelines implemented  Yes, yellow  Treat  MEWS Interventions Considered administering scheduled or prn medications/treatments as ordered  Take Vital Signs  Increase Vital Sign Frequency  Yellow: Q2hr x1, continue Q4hrs until patient remains green for 12hrs  Escalate  MEWS: Escalate Yellow: Discuss with charge nurse and consider notifying provider and/or RRT  Notify: Charge Nurse/RN  Name of Charge Nurse/RN Notified Chales Abrahams  Provider Notification  Provider Name/Title Frankey Shown  Date Provider Notified 05/31/23  Time Provider Notified 0630  Method of Notification Page (chat)  Notification Reason Other (Comment) (yellow muse)  Provider response Other (Comment) (awaiting response)  Assess: SIRS CRITERIA  SIRS Temperature  0  SIRS Pulse 1  SIRS Respirations  0  SIRS WBC 0  SIRS Score Sum  1   Patient is asymptomatic.

## 2023-05-31 NOTE — Progress Notes (Signed)
PROGRESS NOTE     Alyssa Carey, is a 52 y.o. female, DOB - 1971/04/18, LKG:[id number]  Admit date - 05/29/2023   Admitting Physician Lilyan Gilford, DO  Outpatient Primary MD for the patient is Richardean Chimera, MD  LOS - 1  Chief Complaint  Patient presents with   Emesis        Brief Narrative:  52 y.o. female with past medical history relevant for necrotizing fasciitis status post diverting colostomy in June 2021 , GERD,, prior GI bleed, history of alcohol abuse with concerns for possible early liver cirrhosis admitted on 06/16/2019 for persistent elevated LFTs    -Assessment and Plan: 1)Transaminitis--??  Etiology Elevated LFTs noted with AST of 466 ALT of 135 alkaline phosphatase of 467 T. bili of 4.6  -No fevers or leukocytosis right upper quadrant ultrasound shows increased hepatic parenchyma suggestive of steatosis, no gallstones or evidence of acute cholecystitisdilated common bile duct MRI/MRCP recommended- -CT abdomen and pelvis suggestive of cholelithiasis, hepatic steatosis -MRI/MRCP on 05/30/23 showed 05/30/2023 with mild CBD dilation, severe hepatomegaly, cirrhosis, biliary sludge but no findings of choledocholithiasis or cholecystitis  -GI input appreciated -PETH testing (pending) , if PETH is negative patient may need liver biopsy In future given consistently elevated liver enzymes  = As needed antiemetics and pain medications for now     2) hyponatremia/hypokalemia/hypomagnesemia--- in the setting of GI losses -IV fluids as ordered -Replace electrolytes   3) anemia and thrombocytopenia----Hemoglobin 8.5 down from 10.7 yesterday platelets 109 down from 127 , ??  Underlying liver pathology -Patient also with concerns for possible ongoing GI bleed/peptic ulcer disease   4)Class 2 Obesity--- -Low calorie diet, portion control and increase physical activity discussed with patient -Body mass index is 36.57 kg/m.   5)PUD/GERD-- She had EGD on 04/20/23  with clean based pyloric ulcer of 13mm.  continue Protonix -she will need outpatient capsule endoscopy   Status is: Inpatient   Disposition: The patient is from: Home              Anticipated d/c is to: Home              Anticipated d/c date is: 1 day              Patient currently is not medically stable to d/c. Barriers: Not Clinically Stable-   Code Status :  -  Code Status: Full Code   Family Communication:    NA (patient is alert, awake and coherent)   DVT Prophylaxis  :   - SCDs  SCDs Start: 05/30/23 0753 Place TED hose Start: 05/30/23 0753 heparin injection 5,000 Units Start: 05/30/23 0600 SCDs Start: 05/30/23 0418   Lab Results  Component Value Date   PLT 109 (L) 05/30/2023    Inpatient Medications  Scheduled Meds:  heparin  5,000 Units Subcutaneous Q8H   montelukast  10 mg Oral QHS   pantoprazole (PROTONIX) IV  40 mg Intravenous Q24H   sodium chloride flush  3 mL Intravenous Q12H   sodium chloride flush  3 mL Intravenous Q12H   traZODone  50 mg Oral QHS   Continuous Infusions:  dextrose 5 % and 0.9 % NaCl 100 mL/hr at 05/31/23 0600   PRN Meds:.bisacodyl, gabapentin, hydrOXYzine, ibuprofen, morphine injection, ondansetron **OR** ondansetron (ZOFRAN) IV, mouth rinse, polyethylene glycol, sodium chloride flush   Anti-infectives (From admission, onward)    None         Subjective: Alyssa Carey today has no fevers, no emesis,  No  chest pain,   - Abdominal pain is not worse -Willing to try liquid diet   Objective: Vitals:   05/31/23 0911 05/31/23 1016 05/31/23 1128 05/31/23 1522  BP: (!) 80/47 (!) 93/58 (!) 86/49 91/65  Pulse: (!) 120 (!) 121 (!) 113 (!) 106  Resp: 18 19 18 19   Temp: 99 F (37.2 C) 99.1 F (37.3 C) 98.7 F (37.1 C) 98.6 F (37 C)  TempSrc: Oral Oral Oral Oral  SpO2: 97% 95% 97% 97%  Weight:      Height:        Intake/Output Summary (Last 24 hours) at 05/31/2023 1847 Last data filed at 05/31/2023 1302 Gross per 24 hour   Intake 1964.54 ml  Output --  Net 1964.54 ml   Filed Weights   05/29/23 1841 05/30/23 0852  Weight: 74.8 kg 76.7 kg    Physical Exam  Gen:- Awake Alert, in no apparent distress  HEENT:- [place].AT, +ve sclera icterus Neck-Supple Neck,No JVD,.  Lungs-  CTAB , fair symmetrical air movement CV- S1, S2 normal, regular  Abd-  +ve B.Sounds, Abd Soft, right upper quadrant and epigastric area discomfort without rebound or guarding   Extremity/Skin:- No  edema, pedal pulses present  Psych-affect is appropriate, oriented x3 Neuro-no new focal deficits, no tremors  Data Reviewed: I have personally reviewed following labs and imaging studies  CBC: Recent Labs  Lab 05/29/23 1901 05/30/23 0507  WBC 4.9 4.9  NEUTROABS  --  3.8  HGB 10.7* 8.5*  HCT 32.6* 25.8*  MCV 93.1 93.8  PLT 127* 109*   Basic Metabolic Panel: Recent Labs  Lab 05/29/23 1901 05/30/23 0507 05/31/23 0506  NA 129* 130* 131*  K 3.3* 2.9* 3.9  CL 91* 101 98  CO2 18* 18* 23  GLUCOSE 88 88 134*  BUN <5* <5* <5*  CREATININE 0.49 0.50 0.44  CALCIUM 8.7* 7.7* 8.0*  MG  --  1.5*  --    GFR: Estimated Creatinine Clearance: 71.5 mL/min (by C-G formula based on SCr of 0.44 mg/dL). Liver Function Tests: Recent Labs  Lab 05/29/23 1901 05/30/23 0507 05/31/23 0506  AST 466* 319* 241*  ALT 135* 105* 88*  ALKPHOS 467* 358* 301*  BILITOT 4.6* 4.1* 3.9*  PROT 8.0 6.5 5.8*  ALBUMIN 3.0* 2.3* 2.1*   Cardiac Enzymes: No results for input(s): "CKTOTAL", "CKMB", "CKMBINDEX", "TROPONINI" in the last 168 hours. BNP (last 3 results) No results for input(s): "PROBNP" in the last 8760 hours. HbA1C: No results for input(s): "HGBA1C" in the last 72 hours. Sepsis Labs: @LABRCNTIP (procalcitonin:4,lacticidven:4) )No results found for this or any previous visit (from the past 240 hour(s)).    Radiology Studies: MR ABDOMEN MRCP W WO CONTAST  Result Date: 05/31/2023 CLINICAL DATA:  52 year old female with history of right  upper quadrant abdominal pain, nausea and dilated common bile duct noted on recent ultrasound examination. EXAM: MRI ABDOMEN WITHOUT AND WITH CONTRAST (INCLUDING MRCP) TECHNIQUE: Multiplanar multisequence MR imaging of the abdomen was performed both before and after the administration of intravenous contrast. Heavily T2-weighted images of the biliary and pancreatic ducts were obtained, and three-dimensional MRCP images were rendered by post processing. CONTRAST:  7.29mL GADAVIST GADOBUTROL 1 MMOL/ML IV SOLN COMPARISON:  No prior abdominal MRI. CT of the abdomen and pelvis 05/30/2023. Right upper quadrant abdominal ultrasound 05/30/2023. FINDINGS: Lower chest: Unremarkable. Hepatobiliary: Liver is diffusely enlarged measuring up to 22.5 cm in craniocaudal span. Liver has a slightly nodular contour, concerning for developing cirrhosis. Severe diffuse loss of signal intensity  throughout the hepatic parenchyma on out of phase dual echo images, indicative of a background of severe hepatic steatosis. No suspicious cystic or solid hepatic lesions are noted. Small filling defects are noted within the gallbladder, compatible with gallstones and/or trace amount of biliary sludge. Largest gallstone measures 5 mm. Gallbladder is moderately distended. Gallbladder wall thickness appears normal. No pericholecystic fluid or surrounding inflammatory changes. No intrahepatic biliary ductal dilatation. Common bile duct measures up to 7 mm in the porta hepatis (minimally dilated). No filling defect within the common bile duct to suggest choledocholithiasis. Pancreas: No pancreatic mass. No pancreatic ductal dilatation. No pancreatic or peripancreatic fluid collections or inflammatory changes. Spleen:  Unremarkable. Adrenals/Urinary Tract: In the lower pole of the left kidney (coronal image 22 of series 8) there is an exophytic 1.4 cm T1 hypointense, T2 hyperintense, nonenhancing lesion compatible with a simple (Bosniak class 1) cysts  which requires no imaging follow-up. Right kidney and bilateral adrenal glands are otherwise normal in appearance. No hydroureteronephrosis in the visualized portions of the abdomen. Stomach/Bowel: Visualized portions are unremarkable. Vascular/Lymphatic: No aneurysm identified in the visualized abdominal vasculature. No lymphadenopathy noted in the abdomen. Other: No significant volume of ascites noted in the visualized portions of the peritoneal cavity. Musculoskeletal: No aggressive appearing osseous lesions are noted in the visualized portions of the skeleton. IMPRESSION: 1. Although there is mild dilatation of the common bile duct (7 mm), there is no intrahepatic biliary ductal dilatation. Additionally, there is no choledocholithiasis or other findings to suggest obstruction. 2. Severe hepatomegaly with severe hepatic steatosis in nodular contour of the liver suggesting developing cirrhosis. 3. Trace amount of biliary sludge and small gallstones lying dependently in the gallbladder. No findings to suggest an acute cholecystitis at this time. Electronically Signed   By: Trudie Reed M.D.   On: 05/31/2023 06:44   MR 3D Recon At Scanner  Result Date: 05/31/2023 CLINICAL DATA:  52 year old female with history of right upper quadrant abdominal pain, nausea and dilated common bile duct noted on recent ultrasound examination. EXAM: MRI ABDOMEN WITHOUT AND WITH CONTRAST (INCLUDING MRCP) TECHNIQUE: Multiplanar multisequence MR imaging of the abdomen was performed both before and after the administration of intravenous contrast. Heavily T2-weighted images of the biliary and pancreatic ducts were obtained, and three-dimensional MRCP images were rendered by post processing. CONTRAST:  7.47mL GADAVIST GADOBUTROL 1 MMOL/ML IV SOLN COMPARISON:  No prior abdominal MRI. CT of the abdomen and pelvis 05/30/2023. Right upper quadrant abdominal ultrasound 05/30/2023. FINDINGS: Lower chest: Unremarkable. Hepatobiliary: Liver  is diffusely enlarged measuring up to 22.5 cm in craniocaudal span. Liver has a slightly nodular contour, concerning for developing cirrhosis. Severe diffuse loss of signal intensity throughout the hepatic parenchyma on out of phase dual echo images, indicative of a background of severe hepatic steatosis. No suspicious cystic or solid hepatic lesions are noted. Small filling defects are noted within the gallbladder, compatible with gallstones and/or trace amount of biliary sludge. Largest gallstone measures 5 mm. Gallbladder is moderately distended. Gallbladder wall thickness appears normal. No pericholecystic fluid or surrounding inflammatory changes. No intrahepatic biliary ductal dilatation. Common bile duct measures up to 7 mm in the porta hepatis (minimally dilated). No filling defect within the common bile duct to suggest choledocholithiasis. Pancreas: No pancreatic mass. No pancreatic ductal dilatation. No pancreatic or peripancreatic fluid collections or inflammatory changes. Spleen:  Unremarkable. Adrenals/Urinary Tract: In the lower pole of the left kidney (coronal image 22 of series 8) there is an exophytic 1.4 cm T1 hypointense, T2  hyperintense, nonenhancing lesion compatible with a simple (Bosniak class 1) cysts which requires no imaging follow-up. Right kidney and bilateral adrenal glands are otherwise normal in appearance. No hydroureteronephrosis in the visualized portions of the abdomen. Stomach/Bowel: Visualized portions are unremarkable. Vascular/Lymphatic: No aneurysm identified in the visualized abdominal vasculature. No lymphadenopathy noted in the abdomen. Other: No significant volume of ascites noted in the visualized portions of the peritoneal cavity. Musculoskeletal: No aggressive appearing osseous lesions are noted in the visualized portions of the skeleton. IMPRESSION: 1. Although there is mild dilatation of the common bile duct (7 mm), there is no intrahepatic biliary ductal dilatation.  Additionally, there is no choledocholithiasis or other findings to suggest obstruction. 2. Severe hepatomegaly with severe hepatic steatosis in nodular contour of the liver suggesting developing cirrhosis. 3. Trace amount of biliary sludge and small gallstones lying dependently in the gallbladder. No findings to suggest an acute cholecystitis at this time. Electronically Signed   By: Trudie Reed M.D.   On: 05/31/2023 06:44   US Abdomen Limited RUQ (LIVER/GB)  Result Date: 05/30/2023 CLINICAL DATA:  Right upper quadrant pain EXAM: ULTRASOUND ABDOMEN LIMITED RIGHT UPPER QUADRANT COMPARISON:  MR abdomen 05/30/2023 FINDINGS: Gallbladder: No gallstones or wall thickening visualized. No sonographic Murphy sign noted by sonographer. Common bile duct: Diameter: 10 mm, dilated Liver: Increased echogenicity. No focal lesion. Portal vein is patent on color Doppler imaging with normal direction of blood flow towards the liver. Other: None. IMPRESSION: 1. No cholelithiasis or sonographic evidence for acute cholecystitis. 2. Increased hepatic parenchymal echogenicity suggestive of steatosis. 3. Common bile duct markedly dilated, correlate with LFTs. If signs of obstruction, recommend MRI/MRCP. Electronically Signed   By: Annia Belt M.D.   On: 05/30/2023 08:49   CT ABDOMEN PELVIS WO CONTRAST  Result Date: 05/30/2023 CLINICAL DATA:  Epigastric pain. EXAM: CT ABDOMEN AND PELVIS WITHOUT CONTRAST TECHNIQUE: Multidetector CT imaging of the abdomen and pelvis was performed following the standard protocol without IV contrast. RADIATION DOSE REDUCTION: This exam was performed according to the departmental dose-optimization program which includes automated exposure control, adjustment of the mA and/or kV according to patient size and/or use of iterative reconstruction technique. COMPARISON:  March 09, 2023 FINDINGS: Lower chest: No acute abnormality. Hepatobiliary: The liver is enlarged with diffuse fatty infiltration of  the liver parenchyma. No focal liver abnormality is seen. A solitary 4 mm gallstone is seen without evidence of gallbladder wall thickening, pericholecystic inflammation or biliary dilatation. Pancreas: Unremarkable. No pancreatic ductal dilatation or surrounding inflammatory changes. Spleen: Normal in size without focal abnormality. Adrenals/Urinary Tract: Adrenal glands are unremarkable. Kidneys are normal, without renal calculi, focal lesion, or hydronephrosis. Bladder is unremarkable. Stomach/Bowel: Stomach is within normal limits. Appendix appears normal. Surgically anastomosed bowel is seen within the junction of the descending and sigmoid colon. No evidence of bowel wall thickening, distention, or inflammatory changes. Vascular/Lymphatic: No significant vascular findings are present. No enlarged abdominal or pelvic lymph nodes. Reproductive: Uterus and bilateral adnexa are unremarkable. Other: A 1.5 cm x 2.6 cm x 2.2 cm fat containing ventral hernia is seen along the midline of the mid to upper abdomen. No abdominopelvic ascites. Musculoskeletal: A stable appearing postoperative soft tissue deformity is seen along the posterior perineum and perianal region, to the left of midline. IMPRESSION: 1. Hepatomegaly and hepatic steatosis. 2. Cholelithiasis. 3. Small fat containing ventral hernia. 4. Stable appearing postoperative soft tissue deformity along the posterior perineum and perianal region, to the left of midline. Electronically Signed   By: Waylan Rocher  [place] M.D.   On: 05/30/2023 02:54    Scheduled Meds:  heparin  5,000 Units Subcutaneous Q8H   montelukast  10 mg Oral QHS   pantoprazole (PROTONIX) IV  40 mg Intravenous Q24H   sodium chloride flush  3 mL Intravenous Q12H   sodium chloride flush  3 mL Intravenous Q12H   traZODone  50 mg Oral QHS   Continuous Infusions:  dextrose 5 % and 0.9 % NaCl 100 mL/hr at 05/31/23 0600    LOS: 1 day   Shon Hale M.D on 05/31/2023 at 6:47 PM  Go to  [email or web address] - for contact info  [facility] - Office  [phone]  If 7PM-7AM, please contact night-coverage [email or web address] 05/31/2023, 6:47 PM

## 2023-05-31 NOTE — Telephone Encounter (Signed)
Patient currently inpatient.   Patient will need follow up OV with Dr. Tasia Catchings in 2-3 weeks. Hospital follow up for elevated LFTs, RUQ pain.   She will need outpatient capsule study as ordered by Dr. Tasia Catchings.  She had the MRI/MRCP while inpatient.

## 2023-05-31 NOTE — Progress Notes (Signed)
   05/31/23 1018  TOC Brief Assessment  Insurance and Status Lapsed  Patient has primary care physician Yes  Home environment has been reviewed Home with son  Prior level of function: no DME  Prior/Current Home Services No current home services  Social Determinants of Health Reivew SDOH reviewed no interventions necessary  Readmission risk has been reviewed Yes  Transition of care needs no transition of care needs at this time    Transition of Care Department [facility]) has reviewed patient and no TOC needs have been identified at this time. We will continue to monitor patient advancement through interdisciplinary progression rounds. If new patient transition needs arise, please place a TOC consult.

## 2023-05-31 NOTE — Progress Notes (Signed)
Gastroenterology Progress Note   Referring Provider: No ref. provider found Primary Care Physician:  Richardean Chimera, MD Patient ID:  Alyssa Carey; [id number]; March 22, 1971   Subjective:    Feels some better. RUQ pain much improved. Now just feels sore all over. No n/v. Doesn't feel much like eating. Not a fan of clear liquids. Attending planning on advancing diet. She had normal BM today.   Objective:   Vital signs in last 24 hours: Temp:  [98.2 F (36.8 C)-99.1 F (37.3 C)] 99.1 F (37.3 C) (11/14 1016) Pulse Rate:  [100-121] 121 (11/14 1016) Resp:  [18-20] 19 (11/14 1016) BP: (80-101)/(47-68) 93/58 (11/14 1016) SpO2:  [95 %-100 %] 95 % (11/14 1016) Last BM Date : 05/30/23 General:   Alert,  Well-developed, well-nourished, pleasant and cooperative in NAD Head:  Normocephalic and atraumatic. Eyes:  Sclera clear, no icterus.   Abdomen:  Soft,   nondistended.  Mild ruq tendernessNormal bowel sounds, without guarding, and without rebound.   Extremities:  Without clubbing, deformity or edema. Neurologic:  Alert and  oriented x4;  grossly normal neurologically. Skin:  Intact without significant lesions or rashes. Psych:  Alert and cooperative. Normal mood and affect.  Intake/Output from previous day: 11/13 0701 - 11/14 0700 In: 1984.5 [P.O.:480; I.V.:1004.5; IV Piggyback:500] Out: -  Intake/Output this shift: No intake/output data recorded.  Lab Results: CBC Recent Labs    05/29/23 1901 05/30/23 0507  WBC 4.9 4.9  HGB 10.7* 8.5*  HCT 32.6* 25.8*  MCV 93.1 93.8  PLT 127* 109*   BMET Recent Labs    05/29/23 1901 05/30/23 0507 05/31/23 0506  NA 129* 130* 131*  K 3.3* 2.9* 3.9  CL 91* 101 98  CO2 18* 18* 23  GLUCOSE 88 88 134*  BUN <5* <5* <5*  CREATININE 0.49 0.50 0.44  CALCIUM 8.7* 7.7* 8.0*   LFTs Recent Labs    05/29/23 1901 05/30/23 0507 05/31/23 0506  BILITOT 4.6* 4.1* 3.9*  ALKPHOS 467* 358* 301*  AST 466* 319* 241*  ALT 135* 105* 88*  PROT  8.0 6.5 5.8*  ALBUMIN 3.0* 2.3* 2.1*   Recent Labs    05/29/23 1901  LIPASE 28   PT/INR Recent Labs    05/30/23 1122  LABPROT 12.6  INR 0.9         Imaging Studies: MR ABDOMEN MRCP W WO CONTAST  Result Date: 05/31/2023 CLINICAL DATA:  52 year old female with history of right upper quadrant abdominal pain, nausea and dilated common bile duct noted on recent ultrasound examination. EXAM: MRI ABDOMEN WITHOUT AND WITH CONTRAST (INCLUDING MRCP) TECHNIQUE: Multiplanar multisequence MR imaging of the abdomen was performed both before and after the administration of intravenous contrast. Heavily T2-weighted images of the biliary and pancreatic ducts were obtained, and three-dimensional MRCP images were rendered by post processing. CONTRAST:  7.74mL GADAVIST GADOBUTROL 1 MMOL/ML IV SOLN COMPARISON:  No prior abdominal MRI. CT of the abdomen and pelvis 05/30/2023. Right upper quadrant abdominal ultrasound 05/30/2023. FINDINGS: Lower chest: Unremarkable. Hepatobiliary: Liver is diffusely enlarged measuring up to 22.5 cm in craniocaudal span. Liver has a slightly nodular contour, concerning for developing cirrhosis. Severe diffuse loss of signal intensity throughout the hepatic parenchyma on out of phase dual echo images, indicative of a background of severe hepatic steatosis. No suspicious cystic or solid hepatic lesions are noted. Small filling defects are noted within the gallbladder, compatible with gallstones and/or trace amount of biliary sludge. Largest gallstone measures 5 mm. Gallbladder is moderately distended.  Gallbladder wall thickness appears normal. No pericholecystic fluid or surrounding inflammatory changes. No intrahepatic biliary ductal dilatation. Common bile duct measures up to 7 mm in the porta hepatis (minimally dilated). No filling defect within the common bile duct to suggest choledocholithiasis. Pancreas: No pancreatic mass. No pancreatic ductal dilatation. No pancreatic or  peripancreatic fluid collections or inflammatory changes. Spleen:  Unremarkable. Adrenals/Urinary Tract: In the lower pole of the left kidney (coronal image 22 of series 8) there is an exophytic 1.4 cm T1 hypointense, T2 hyperintense, nonenhancing lesion compatible with a simple (Bosniak class 1) cysts which requires no imaging follow-up. Right kidney and bilateral adrenal glands are otherwise normal in appearance. No hydroureteronephrosis in the visualized portions of the abdomen. Stomach/Bowel: Visualized portions are unremarkable. Vascular/Lymphatic: No aneurysm identified in the visualized abdominal vasculature. No lymphadenopathy noted in the abdomen. Other: No significant volume of ascites noted in the visualized portions of the peritoneal cavity. Musculoskeletal: No aggressive appearing osseous lesions are noted in the visualized portions of the skeleton. IMPRESSION: 1. Although there is mild dilatation of the common bile duct (7 mm), there is no intrahepatic biliary ductal dilatation. Additionally, there is no choledocholithiasis or other findings to suggest obstruction. 2. Severe hepatomegaly with severe hepatic steatosis in nodular contour of the liver suggesting developing cirrhosis. 3. Trace amount of biliary sludge and small gallstones lying dependently in the gallbladder. No findings to suggest an acute cholecystitis at this time. Electronically Signed   By: Trudie Reed M.D.   On: 05/31/2023 06:44   MR 3D Recon At Scanner  Result Date: 05/31/2023 CLINICAL DATA:  52 year old female with history of right upper quadrant abdominal pain, nausea and dilated common bile duct noted on recent ultrasound examination. EXAM: MRI ABDOMEN WITHOUT AND WITH CONTRAST (INCLUDING MRCP) TECHNIQUE: Multiplanar multisequence MR imaging of the abdomen was performed both before and after the administration of intravenous contrast. Heavily T2-weighted images of the biliary and pancreatic ducts were obtained, and  three-dimensional MRCP images were rendered by post processing. CONTRAST:  7.37mL GADAVIST GADOBUTROL 1 MMOL/ML IV SOLN COMPARISON:  No prior abdominal MRI. CT of the abdomen and pelvis 05/30/2023. Right upper quadrant abdominal ultrasound 05/30/2023. FINDINGS: Lower chest: Unremarkable. Hepatobiliary: Liver is diffusely enlarged measuring up to 22.5 cm in craniocaudal span. Liver has a slightly nodular contour, concerning for developing cirrhosis. Severe diffuse loss of signal intensity throughout the hepatic parenchyma on out of phase dual echo images, indicative of a background of severe hepatic steatosis. No suspicious cystic or solid hepatic lesions are noted. Small filling defects are noted within the gallbladder, compatible with gallstones and/or trace amount of biliary sludge. Largest gallstone measures 5 mm. Gallbladder is moderately distended. Gallbladder wall thickness appears normal. No pericholecystic fluid or surrounding inflammatory changes. No intrahepatic biliary ductal dilatation. Common bile duct measures up to 7 mm in the porta hepatis (minimally dilated). No filling defect within the common bile duct to suggest choledocholithiasis. Pancreas: No pancreatic mass. No pancreatic ductal dilatation. No pancreatic or peripancreatic fluid collections or inflammatory changes. Spleen:  Unremarkable. Adrenals/Urinary Tract: In the lower pole of the left kidney (coronal image 22 of series 8) there is an exophytic 1.4 cm T1 hypointense, T2 hyperintense, nonenhancing lesion compatible with a simple (Bosniak class 1) cysts which requires no imaging follow-up. Right kidney and bilateral adrenal glands are otherwise normal in appearance. No hydroureteronephrosis in the visualized portions of the abdomen. Stomach/Bowel: Visualized portions are unremarkable. Vascular/Lymphatic: No aneurysm identified in the visualized abdominal vasculature. No lymphadenopathy noted in  the abdomen. Other: No significant volume of  ascites noted in the visualized portions of the peritoneal cavity. Musculoskeletal: No aggressive appearing osseous lesions are noted in the visualized portions of the skeleton. IMPRESSION: 1. Although there is mild dilatation of the common bile duct (7 mm), there is no intrahepatic biliary ductal dilatation. Additionally, there is no choledocholithiasis or other findings to suggest obstruction. 2. Severe hepatomegaly with severe hepatic steatosis in nodular contour of the liver suggesting developing cirrhosis. 3. Trace amount of biliary sludge and small gallstones lying dependently in the gallbladder. No findings to suggest an acute cholecystitis at this time. Electronically Signed   By: Trudie Reed M.D.   On: 05/31/2023 06:44   US Abdomen Limited RUQ (LIVER/GB)  Result Date: 05/30/2023 CLINICAL DATA:  Right upper quadrant pain EXAM: ULTRASOUND ABDOMEN LIMITED RIGHT UPPER QUADRANT COMPARISON:  MR abdomen 05/30/2023 FINDINGS: Gallbladder: No gallstones or wall thickening visualized. No sonographic Murphy sign noted by sonographer. Common bile duct: Diameter: 10 mm, dilated Liver: Increased echogenicity. No focal lesion. Portal vein is patent on color Doppler imaging with normal direction of blood flow towards the liver. Other: None. IMPRESSION: 1. No cholelithiasis or sonographic evidence for acute cholecystitis. 2. Increased hepatic parenchymal echogenicity suggestive of steatosis. 3. Common bile duct markedly dilated, correlate with LFTs. If signs of obstruction, recommend MRI/MRCP. Electronically Signed   By: Annia Belt M.D.   On: 05/30/2023 08:49   CT ABDOMEN PELVIS WO CONTRAST  Result Date: 05/30/2023 CLINICAL DATA:  Epigastric pain. EXAM: CT ABDOMEN AND PELVIS WITHOUT CONTRAST TECHNIQUE: Multidetector CT imaging of the abdomen and pelvis was performed following the standard protocol without IV contrast. RADIATION DOSE REDUCTION: This exam was performed according to the departmental  dose-optimization program which includes automated exposure control, adjustment of the mA and/or kV according to patient size and/or use of iterative reconstruction technique. COMPARISON:  March 09, 2023 FINDINGS: Lower chest: No acute abnormality. Hepatobiliary: The liver is enlarged with diffuse fatty infiltration of the liver parenchyma. No focal liver abnormality is seen. A solitary 4 mm gallstone is seen without evidence of gallbladder wall thickening, pericholecystic inflammation or biliary dilatation. Pancreas: Unremarkable. No pancreatic ductal dilatation or surrounding inflammatory changes. Spleen: Normal in size without focal abnormality. Adrenals/Urinary Tract: Adrenal glands are unremarkable. Kidneys are normal, without renal calculi, focal lesion, or hydronephrosis. Bladder is unremarkable. Stomach/Bowel: Stomach is within normal limits. Appendix appears normal. Surgically anastomosed bowel is seen within the junction of the descending and sigmoid colon. No evidence of bowel wall thickening, distention, or inflammatory changes. Vascular/Lymphatic: No significant vascular findings are present. No enlarged abdominal or pelvic lymph nodes. Reproductive: Uterus and bilateral adnexa are unremarkable. Other: A 1.5 cm x 2.6 cm x 2.2 cm fat containing ventral hernia is seen along the midline of the mid to upper abdomen. No abdominopelvic ascites. Musculoskeletal: A stable appearing postoperative soft tissue deformity is seen along the posterior perineum and perianal region, to the left of midline. IMPRESSION: 1. Hepatomegaly and hepatic steatosis. 2. Cholelithiasis. 3. Small fat containing ventral hernia. 4. Stable appearing postoperative soft tissue deformity along the posterior perineum and perianal region, to the left of midline. Electronically Signed   By: Aram Candela M.D.   On: 05/30/2023 02:54  [2 weeks]  Assessment:   FAVOR MASSOTH is a 52 y.o. year old female with complex medical history  now presenting to the ED with RUQ pain, nausea, vomiting and found to have signfiicant elevated LFTs. GI consulted for further evaluation  Elevated LFTs/CBD dilation/n/v/RUQ pain: nausea, vomiting and RUQ pain for the past week. LFTs markedly increased on admission yesterday. She is also endorsing itching as well. She has been recommended to have outpatient MRCP for further evaluation of ongoing LFT elevation which has not yet been completed.   This admission she has an upward trend in LFTs, and markedly dilated CBD to 10mm which is new from previous imaging in September, though she has had CBD dilation up to 11mm on previous imaging in August.    MRCP completed yesterday showing CBD 7mm with no intrahepatic dilation and no CBD filling defects. She had moderately distended gallbladder with normal wall thickness, gallstones with largest measuring 5mm.also with severe hepatomegaly with severe hepatic steatosis and nodular contour.   Abdominal pain improved, LFTs trending downward. She may have passed a stone.   Cirrhosis: compensated. MELD 3.0 of 19, was 13 03/2023.    Etiology of cirrhosis is ETOH, though she reports no use in 1-2 years. Autoimmune serologies, hepatitis panel all negative. She has no signs of HE, ascites, no EVs on upper endoscopic evaluation. Negative AFP in August 2024, no focal liver lesions on recent CT or MRI this admission. Needs continued outpatient cirrhosis care and continued ETOH cessation.    Will update INR today to calculate MELD score   Anemia: hgb 10.7 on admission, down to 8.5 yesterday but about baseline from 03/2023. Denies rectal bleeding or melena currently. Extensive endoscopic workup in the past. She had EGD on 04/20/23 with clean based pyloric ulcer of 13mm.   Will continue with  PPI daily and avoiding all NSAIDs. she was recommended to have capsule endoscopy as outpatient for further evaluation and possible repeat upper endoscopy in 2-3 months pending findings  of capsule study. Capsule study is currently in the process of being scheduled.       Plan:   Outpatient capsule endoscopy. Outpatient cirrhosis care. PETH, planned as outpatient and not completed. If negative, she may need liver biopsy given reported abstinence of etoh in over two years and consistently elevated liver enzymes.  Agree with trial of advancing diet.  Switch PPI to oral.    LOS: 1 day   Leanna Battles. Dixon Boos [facility] [phone] 11/14/202410:21 AM

## 2023-05-31 NOTE — Progress Notes (Signed)
   05/31/23 0911  Assess: MEWS Score  Temp 99 F (37.2 C)  BP (!) 80/47  MAP (mmHg) (!) 57  Pulse Rate (!) 120  Resp 18  SpO2 97 %  O2 Device Room Air  Assess: MEWS Score  MEWS Temp 0  MEWS Systolic 2  MEWS Pulse 2  MEWS RR 0  MEWS LOC 0  MEWS Score 4  MEWS Score Color Red  Assess: SIRS CRITERIA  SIRS Temperature  0  SIRS Pulse 1  SIRS Respirations  0  SIRS WBC 0  SIRS Score Sum  1     Pt c/o generalized body aches and pain 8/10. Vital signs obtained MD notified

## 2023-06-01 ENCOUNTER — Ambulatory Visit (HOSPITAL_BASED_OUTPATIENT_CLINIC_OR_DEPARTMENT_OTHER): Payer: Medicaid Other | Admitting: General Surgery

## 2023-06-01 LAB — CBC
HCT: 22.9 % — ABNORMAL LOW (ref 36.0–46.0)
Hemoglobin: 7.1 g/dL — ABNORMAL LOW (ref 12.0–15.0)
MCH: 30 pg (ref 26.0–34.0)
MCHC: 31 g/dL (ref 30.0–36.0)
MCV: 96.6 fL (ref 80.0–100.0)
Platelets: 102 10*3/uL — ABNORMAL LOW (ref 150–400)
RBC: 2.37 MIL/uL — ABNORMAL LOW (ref 3.87–5.11)
RDW: 15.9 % — ABNORMAL HIGH (ref 11.5–15.5)
WBC: 3.6 10*3/uL — ABNORMAL LOW (ref 4.0–10.5)
nRBC: 0 % (ref 0.0–0.2)

## 2023-06-01 LAB — GLUCOSE, CAPILLARY
Glucose-Capillary: 92 mg/dL (ref 70–99)
Glucose-Capillary: 118 mg/dL — ABNORMAL HIGH (ref 70–99)
Glucose-Capillary: 117 mg/dL — ABNORMAL HIGH (ref 70–99)

## 2023-06-01 LAB — PROTIME-INR
INR: 1.3 — ABNORMAL HIGH (ref 0.8–1.2)
Prothrombin Time: 16.2 s — ABNORMAL HIGH (ref 11.4–15.2)

## 2023-06-01 LAB — AFP TUMOR MARKER: AFP, Serum, Tumor Marker: 2.8 ng/mL (ref 0.0–6.4)

## 2023-06-01 LAB — COMPREHENSIVE METABOLIC PANEL
ALT: 83 U/L — ABNORMAL HIGH (ref 0–44)
AST: 194 U/L — ABNORMAL HIGH (ref 15–41)
Albumin: 2 g/dL — ABNORMAL LOW (ref 3.5–5.0)
Alkaline Phosphatase: 279 U/L — ABNORMAL HIGH (ref 38–126)
Anion gap: 6 (ref 5–15)
BUN: <5 mg/dL — ABNORMAL LOW (ref 6–20)
CO2: 22 mmol/L (ref 22–32)
Calcium: 8.2 mg/dL — ABNORMAL LOW (ref 8.9–10.3)
Chloride: 103 mmol/L (ref 98–111)
Creatinine, Ser: 0.48 mg/dL (ref 0.44–1.00)
GFR, Estimated: >60 mL/min (ref >60)
Glucose, Bld: 107 mg/dL — ABNORMAL HIGH (ref 70–99)
Potassium: 3.5 mmol/L (ref 3.5–5.1)
Sodium: 131 mmol/L — ABNORMAL LOW (ref 135–145)
Total Bilirubin: 4.2 mg/dL — ABNORMAL HIGH (ref <1.2)
Total Protein: 5.9 g/dL — ABNORMAL LOW (ref 6.5–8.1)

## 2023-06-01 LAB — VITAMIN B12: Vitamin B-12: 967 pg/mL — ABNORMAL HIGH (ref 180–914)

## 2023-06-01 LAB — FOLATE: Folate: 7.6 ng/mL (ref >5.9)

## 2023-06-01 LAB — PREPARE RBC (CROSSMATCH)

## 2023-06-01 MED ORDER — POTASSIUM CHLORIDE CRYS ER 20 MEQ PO TBCR
40.0000 meq | EXTENDED_RELEASE_TABLET | Freq: Once | ORAL | Status: AC
  Administered 2023-06-01: 40 meq via ORAL
  Filled 2023-06-01: qty 2

## 2023-06-01 MED ORDER — SODIUM CHLORIDE 0.9% IV SOLUTION
Freq: Once | INTRAVENOUS | Status: AC
Start: 2023-06-01 — End: 2023-06-01

## 2023-06-01 MED ORDER — FLUTICASONE PROPIONATE 50 MCG/ACT NA SUSP
2.0000 | Freq: Every day | NASAL | Status: DC
  Administered 2023-06-01 – 2023-06-02 (×2): 2 via NASAL
  Filled 2023-06-01: qty 16

## 2023-06-01 NOTE — Progress Notes (Signed)
Chart reviewed. Patient not seen.   Outpatient capsule and hospital follow up being arranged. AST/ALT and alk phos down trending.   Slight drop in Hgb from yesterday to today. Agree with transfusion.    GI signing off. If any overt bleeding or hemodynamic instability feel free to re-consult.  Continue PPI BID Resume oral iron on discharge Needs f/u CBC within 1 week post discharge GI will follow up PETH test.   Brooke Bonito, MSN, APRN, FNP-BC, AGACNP-BC [facility]

## 2023-06-01 NOTE — Telephone Encounter (Signed)
Pt currently still admitted. Would you like me to order the capsule study once she is out of hospital or would you like me to schedule once she sees you for follow up? Please advise. Thank you

## 2023-06-01 NOTE — Plan of Care (Signed)
  Problem: Clinical Measurements: Goal: Diagnostic test results will improve Outcome: Not Progressing   Problem: Clinical Measurements: Goal: Cardiovascular complication will be avoided Outcome: Not Progressing   Problem: Pain Management: Goal: General experience of comfort will improve Outcome: Not Progressing   Problem: Pain Management: Goal: General experience of comfort will improve Outcome: Not Progressing

## 2023-06-01 NOTE — Progress Notes (Signed)
   05/31/23 1924  Assess: MEWS Score  Temp 98.4 F (36.9 C)  BP 95/67  MAP (mmHg) 77  Pulse Rate (!) 104  Resp 18  Level of Consciousness Alert  SpO2 100 %  O2 Device Room Air  Assess: MEWS Score  MEWS Temp 0  MEWS Systolic 1  MEWS Pulse 1  MEWS RR 0  MEWS LOC 0  MEWS Score 2  MEWS Score Color Yellow  Assess: if the MEWS score is Yellow or Red  Were vital signs accurate and taken at a resting state? Yes  Does the patient meet 2 or more of the SIRS criteria? Yes  Does the patient have a confirmed or suspected source of infection? No  MEWS guidelines implemented  No, previously yellow, continue vital signs every 4 hours  Notify: Charge Nurse/RN  Name of Charge Nurse/RN Notified Geneticist, molecular  Provider Notification  Provider Name/Title Dr. Thomes Dinning  Date Provider Notified 05/31/23  Time Provider Notified 1944  Method of Notification Page  Notification Reason  (yellow mews)  Provider response No new orders  Date of Provider Response 05/31/23  Time of Provider Response 2124  Assess: SIRS CRITERIA  SIRS Temperature  0  SIRS Pulse 1  SIRS Respirations  0  SIRS WBC 0  SIRS Score Sum  1

## 2023-06-01 NOTE — Plan of Care (Signed)

## 2023-06-01 NOTE — Progress Notes (Signed)
PROGRESS NOTE   Alyssa Carey, is a 52 y.o. female, DOB - 08-24-1970, UEA:[id number]  Admit date - 05/29/2023   Admitting Physician Lilyan Gilford, DO  Outpatient Primary MD for the patient is Richardean Chimera, MD  LOS - 2  Chief Complaint  Patient presents with   Emesis      Brief Narrative:  52 y.o. female with past medical history relevant for necrotizing fasciitis status post diverting colostomy in June 2021 , GERD,, prior GI bleed, history of alcohol abuse with concerns for possible early liver cirrhosis admitted on 06/16/2019 for persistent elevated LFTs    -Assessment and Plan: 1)Transaminitis--??  Etiology -Reports abstinence from alcohol for the last couple of years -No fevers or leukocytosis right upper quadrant ultrasound shows increased hepatic parenchyma suggestive of steatosis, no gallstones or evidence of acute cholecystitisdilated common bile duct MRI/MRCP recommended- -CT abdomen and pelvis suggestive of cholelithiasis, hepatic steatosis -MRI/MRCP on 05/30/23 showed 05/30/2023 with mild CBD dilation, severe hepatomegaly, cirrhosis, biliary sludge but no findings of choledocholithiasis or cholecystitis  -GI input appreciated -PETH testing (pending) , if PETH is negative patient may need liver biopsy In future given consistently elevated liver enzymes  = As needed antiemetics and pain medications for now     2) hyponatremia/hypokalemia/hypomagnesemia--- in the setting of GI losses -IV fluids as ordered -Replace electrolytes   3) acute on chronic symptomatic anemia and thrombocytopenia--due to liver pathology -Patient also with concerns for possible ongoing GI bleed/peptic ulcer disease 06/01/23- -Hgb down to 7.1 will transfuse 1 unit of PRBC on 06/01/2023 -Patient reports orthostatic dizziness and fatigue -Platelets trending down -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   5)PUD/GERD-- She had EGD on 04/20/23 with clean based pyloric ulcer of 13mm.  continue Protonix -she will need outpatient capsule endoscopy   Status is: Inpatient   Disposition: The patient is from: Home              Anticipated d/c is to: Home              Anticipated d/c date is: 1 day              Patient currently is not medically stable to d/c. Barriers: Not Clinically Stable-   Code Status :  -  Code Status: Full Code   Family Communication:    NA (patient is alert, awake and coherent)   DVT Prophylaxis  :   - SCDs  SCDs Start: 05/30/23 0753 Place TED hose Start: 05/30/23 0753 heparin injection 5,000 Units Start: 05/30/23 0600 SCDs Start: 05/30/23 0418   Lab Results  Component Value Date   PLT 102 (L) 06/01/2023   Inpatient Medications  Scheduled Meds:  fluticasone  2 spray Each Nare Daily   heparin  5,000 Units Subcutaneous Q8H   montelukast  10 mg Oral QHS   pantoprazole (PROTONIX) IV  40 mg Intravenous Q24H   sodium chloride flush  3 mL Intravenous Q12H   sodium chloride flush  3 mL Intravenous Q12H   traZODone  50 mg Oral QHS   Continuous Infusions:  PRN Meds:.bisacodyl, gabapentin, hydrOXYzine, morphine injection, ondansetron **OR** ondansetron (ZOFRAN) IV, mouth rinse, polyethylene glycol, sodium chloride flush  Anti-infectives (From admission, onward)    None      Subjective: Alyssa Carey today has no fevers, no emesis,  No chest pain,   - -Orthostatic  dizziness, fatigue -Denies overt bleeding but Hgb and platelets trending down   Objective: Vitals:   06/01/23 0336 06/01/23 1015 06/01/23 1039 06/01/23 1420  BP: 100/60 91/69 93/63  (!) 88/58  Pulse: 97 (!) 103 100 100  Resp: 18 18 18 18   Temp: 98.3 F (36.8 C) 98.5 F (36.9 C) 98.2 F (36.8 C) 98.1 F (36.7 C)  TempSrc: Oral Oral Oral Oral  SpO2: 98% 99% 99% 99%  Weight:      Height:        Intake/Output Summary (Last 24  hours) at 06/01/2023 1839 Last data filed at 06/01/2023 1805 Gross per 24 hour  Intake 1442 ml  Output --  Net 1442 ml   Filed Weights   05/29/23 1841 05/30/23 0852  Weight: 74.8 kg 76.7 kg    Physical Exam Gen:- Awake Alert, in no apparent distress  HEENT:- [place].AT, +ve sclera icterus Neck-Supple Neck,No JVD,.  Lungs-  CTAB , fair symmetrical air movement CV- S1, S2 normal, regular  Abd-  +ve B.Sounds, Abd Soft, right upper quadrant and epigastric area discomfort without rebound or guarding   Extremity/Skin:- No  edema, pedal pulses present  Psych-affect is appropriate, oriented x3 Neuro-no new focal deficits, no tremors  Data Reviewed: I have personally reviewed following labs and imaging studies  CBC: Recent Labs  Lab 05/29/23 1901 05/30/23 0507 06/01/23 0456  WBC 4.9 4.9 3.6*  NEUTROABS  --  3.8  --   HGB 10.7* 8.5* 7.1*  HCT 32.6* 25.8* 22.9*  MCV 93.1 93.8 96.6  PLT 127* 109* 102*   Basic Metabolic Panel: Recent Labs  Lab 05/29/23 1901 05/30/23 0507 05/31/23 0506 06/01/23 0456  NA 129* 130* 131* 131*  K 3.3* 2.9* 3.9 3.5  CL 91* 101 98 103  CO2 18* 18* 23 22  GLUCOSE 88 88 134* 107*  BUN <5* <5* <5* <5*  CREATININE 0.49 0.50 0.44 0.48  CALCIUM 8.7* 7.7* 8.0* 8.2*  MG  --  1.5*  --   --    GFR: Estimated Creatinine Clearance: 71.5 mL/min (by C-G formula based on SCr of 0.48 mg/dL). Liver Function Tests: Recent Labs  Lab 05/29/23 1901 05/30/23 0507 05/31/23 0506 06/01/23 0456  AST 466* 319* 241* 194*  ALT 135* 105* 88* 83*  ALKPHOS 467* 358* 301* 279*  BILITOT 4.6* 4.1* 3.9* 4.2*  PROT 8.0 6.5 5.8* 5.9*  ALBUMIN 3.0* 2.3* 2.1* 2.0*   Scheduled Meds:  fluticasone  2 spray Each Nare Daily   heparin  5,000 Units Subcutaneous Q8H   montelukast  10 mg Oral QHS   pantoprazole (PROTONIX) IV  40 mg Intravenous Q24H   sodium chloride flush  3 mL Intravenous Q12H   sodium chloride flush  3 mL Intravenous Q12H   traZODone  50 mg Oral QHS    Continuous Infusions:   LOS: 2 days   Shon Hale M.D on 06/01/2023 at 6:39 PM  Go to [email or web address] - for contact info  [facility] - Office  [phone]  If 7PM-7AM, please contact night-coverage [email or web address] 06/01/2023, 6:39 PM

## 2023-06-02 LAB — TYPE AND SCREEN
ABO/RH(D): A POS
Antibody Screen: NEGATIVE
Unit division: 0

## 2023-06-02 LAB — COMPREHENSIVE METABOLIC PANEL
ALT: 77 U/L — ABNORMAL HIGH (ref 0–44)
AST: 158 U/L — ABNORMAL HIGH (ref 15–41)
Albumin: 2.1 g/dL — ABNORMAL LOW (ref 3.5–5.0)
Alkaline Phosphatase: 262 U/L — ABNORMAL HIGH (ref 38–126)
Anion gap: 9 (ref 5–15)
BUN: <5 mg/dL — ABNORMAL LOW (ref 6–20)
CO2: 22 mmol/L (ref 22–32)
Calcium: 8.7 mg/dL — ABNORMAL LOW (ref 8.9–10.3)
Chloride: 103 mmol/L (ref 98–111)
Creatinine, Ser: 0.43 mg/dL — ABNORMAL LOW (ref 0.44–1.00)
GFR, Estimated: >60 mL/min (ref >60)
Glucose, Bld: 100 mg/dL — ABNORMAL HIGH (ref 70–99)
Potassium: 4 mmol/L (ref 3.5–5.1)
Sodium: 134 mmol/L — ABNORMAL LOW (ref 135–145)
Total Bilirubin: 5.4 mg/dL — ABNORMAL HIGH (ref <1.2)
Total Protein: 5.9 g/dL — ABNORMAL LOW (ref 6.5–8.1)

## 2023-06-02 LAB — CBC
HCT: 25.6 % — ABNORMAL LOW (ref 36.0–46.0)
Hemoglobin: 8.6 g/dL — ABNORMAL LOW (ref 12.0–15.0)
MCH: 31.7 pg (ref 26.0–34.0)
MCHC: 33.6 g/dL (ref 30.0–36.0)
MCV: 94.5 fL (ref 80.0–100.0)
Platelets: 114 10*3/uL — ABNORMAL LOW (ref 150–400)
RBC: 2.71 MIL/uL — ABNORMAL LOW (ref 3.87–5.11)
RDW: 17.1 % — ABNORMAL HIGH (ref 11.5–15.5)
WBC: 3.8 10*3/uL — ABNORMAL LOW (ref 4.0–10.5)
nRBC: 0 % (ref 0.0–0.2)

## 2023-06-02 LAB — PROTIME-INR
INR: 1.2 (ref 0.8–1.2)
Prothrombin Time: 15.6 s — ABNORMAL HIGH (ref 11.4–15.2)

## 2023-06-02 LAB — BPAM RBC
Blood Product Expiration Date: 202412112359
ISSUE DATE / TIME: 202411151018
Unit Type and Rh: 6200

## 2023-06-02 LAB — GLUCOSE, CAPILLARY
Glucose-Capillary: 102 mg/dL — ABNORMAL HIGH (ref 70–99)
Glucose-Capillary: 105 mg/dL — ABNORMAL HIGH (ref 70–99)

## 2023-06-02 MED ORDER — TRAZODONE HCL 50 MG PO TABS: 50.0000 mg | ORAL_TABLET | Freq: Every evening | ORAL | 2 refills | Status: DC | PRN

## 2023-06-02 MED ORDER — GABAPENTIN 100 MG PO CAPS: 100.0000 mg | ORAL_CAPSULE | Freq: Every evening | ORAL | 1 refills | Status: AC | PRN

## 2023-06-02 MED ORDER — CHOLESTYRAMINE LIGHT 4 G PO PACK
4.0000 g | PACK | Freq: Once | ORAL | Status: AC
  Administered 2023-06-02: 4 g via ORAL
  Filled 2023-06-02: qty 1

## 2023-06-02 MED ORDER — HYDROCORTISONE (PERIANAL) 2.5 % EX CREA: 1.0000 | TOPICAL_CREAM | CUTANEOUS | 2 refills | Status: AC | PRN

## 2023-06-02 MED ORDER — HYDROXYZINE HCL 25 MG PO TABS: 25.0000 mg | ORAL_TABLET | ORAL | 3 refills | Status: AC | PRN

## 2023-06-02 MED ORDER — PANTOPRAZOLE SODIUM 40 MG PO TBEC
40.0000 mg | DELAYED_RELEASE_TABLET | Freq: Two times a day (BID) | ORAL | 3 refills | Status: DC
Start: 2023-06-02 — End: 2024-01-04

## 2023-06-02 MED ORDER — FERROUS SULFATE 325 (65 FE) MG PO TBEC: 325.0000 mg | DELAYED_RELEASE_TABLET | Freq: Every day | ORAL | 3 refills | Status: AC

## 2023-06-02 MED ORDER — MONTELUKAST SODIUM 10 MG PO TABS: 10.0000 mg | ORAL_TABLET | Freq: Every day | ORAL | 3 refills | Status: AC

## 2023-06-02 MED ORDER — FOLIC ACID 1 MG PO TABS: 1.0000 mg | ORAL_TABLET | Freq: Every day | ORAL | 5 refills | Status: AC

## 2023-06-02 MED ORDER — CHOLESTYRAMINE 4 G PO PACK: 4.0000 g | PACK | Freq: Every day | ORAL | 3 refills | Status: AC | PRN

## 2023-06-02 NOTE — Discharge Summary (Signed)
Alyssa Carey, is a 52 y.o. female  DOB May 26, 1971  MRN [id number].  Admission date:  05/29/2023  Admitting Physician  Lilyan Gilford, DO  Discharge Date:  06/02/2023   Primary MD  Richardean Chimera, MD  Recommendations for primary care physician for things to follow:   1)Repeat CBC and CMP blood test within a week advised 2)Follow-up Gastroenterologist Dr. Earnest Bailey with [facility]--- for possible capsule endoscopy and further re-evaluation  -address: [address] [place], [place] [address], Phone: [phone] 3)Avoid ibuprofen/Advil/Aleve/Motrin/Goody Powders/Naproxen/BC powders/Meloxicam/Diclofenac/Indomethacin and other Nonsteroidal anti-inflammatory medications as these will make you more likely to bleed and can cause stomach ulcers, can also cause Kidney problems.   Admission Diagnosis  RUQ abdominal pain [R10.11] Transaminitis [R74.01] Vomiting, unspecified vomiting type, unspecified whether nausea present [R11.10]   Discharge Diagnosis  RUQ abdominal pain [R10.11] Transaminitis [R74.01] Vomiting, unspecified vomiting type, unspecified whether nausea present [R11.10]    Principal Problem:   Transaminitis      Past Medical History:  Diagnosis Date   Alcohol abuse    HTN (hypertension) 10/05/2020   Necrotizing fasciitis [facility])     Past Surgical History:  Procedure Laterality Date   BIOPSY  10/07/2020   Procedure: BIOPSY;  Surgeon: Corbin Ade, MD;  [facility];  Service: Endoscopy;;   BIOPSY  03/10/2023   Procedure: BIOPSY;  Surgeon: Franky Macho, MD;  [facility];  Service: Endoscopy;;   COLONOSCOPY WITH PROPOFOL N/A 10/07/2020   single healing rectal ulcer in distal rectum with surrounding mucosal friability and markedly abnormal proximal colon with ulceration s/p biopsies.   COLONOSCOPY WITH PROPOFOL  02/22/2021    Procedure: COLONOSCOPY WITH PROPOFOL;  Surgeon: Lanelle Bal, DO;  [facility];  Service: Endoscopy;;   COLONOSCOPY WITH PROPOFOL N/A 03/10/2023   Procedure: COLONOSCOPY WITH PROPOFOL;  Surgeon: Franky Macho, MD;  [facility];  Service: Endoscopy;  Laterality: N/A;   COLOSTOMY TAKEDOWN N/A 01/26/2022   Procedure: LAPAROSCOPIC COLOSTOMY TAKEDOWN;  Surgeon: Romie Levee, MD;  [facility]: [facility];  Service: General;  Laterality: N/A;  laparoscopic assisted colostomy reversal and partial colectomy   ESOPHAGOGASTRODUODENOSCOPY (EGD) WITH PROPOFOL N/A 10/07/2020   non-bleeding gastric ulcer . Pathology with H.pylori negative   ESOPHAGOGASTRODUODENOSCOPY (EGD) WITH PROPOFOL N/A 12/27/2020   Procedure: ESOPHAGOGASTRODUODENOSCOPY (EGD) WITH PROPOFOL;  Surgeon: Lanelle Bal, DO;  [facility];  Service: Endoscopy;  Laterality: N/A;  1:00pm   ESOPHAGOGASTRODUODENOSCOPY (EGD) WITH PROPOFOL N/A 01/17/2023   Procedure: ESOPHAGOGASTRODUODENOSCOPY (EGD) WITH PROPOFOL;  Surgeon: Corbin Ade, MD;  [facility];  Service: Endoscopy;  Laterality: N/A;   ESOPHAGOGASTRODUODENOSCOPY (EGD) WITH PROPOFOL N/A 03/10/2023   Procedure: ESOPHAGOGASTRODUODENOSCOPY (EGD) WITH PROPOFOL;  Surgeon: Franky Macho, MD;  [facility];  Service: Endoscopy;  Laterality: N/A;   FLEXIBLE SIGMOIDOSCOPY N/A 01/11/2020   Procedure: FLEXIBLE SIGMOIDOSCOPY;  Surgeon: Jeani Hawking, MD;  [facility];  Service: Gastroenterology;  Laterality: N/A;   INCISION AND DRAINAGE  PERIRECTAL ABSCESS N/A 12/25/2019   Procedure: IRRIGATION AND DEBRIDEMENT BUTTOCKS, LAP LOOP COLOSTOMY;  Surgeon: Gaynelle Adu, MD;  [facility];  Service: General;  Laterality: N/A;   IRRIGATION AND DEBRIDEMENT ABSCESS N/A 12/23/2019   Procedure: EXCISION AND DEBRIDEMENT LEFT BUTTOCK AND PERINEUM;  Surgeon: Berna Bue, MD;  [facility];  Service: General;  Laterality: N/A;    LAPAROSCOPIC LOOP COLOSTOMY N/A 12/25/2019   Procedure: LAPAROSCOPIC LOOP COLOSTOMY;  Surgeon: Gaynelle Adu, MD;  Location: [facility];  Service: General;  Laterality: N/A;   POLYPECTOMY  03/10/2023   Procedure: POLYPECTOMY;  Surgeon: Franky Macho, MD;  [facility];  Service: Endoscopy;;   TONSILLECTOMY       HPI  from the history and physical done on the day of admission:   Alyssa Carey  is a 52 y.o. female with past medical history relevant for necrotizing fasciitis status post diverting colostomy in June 2021 , GERD,, prior GI bleed, history of alcohol abuse with concerns for possible early liver cirrhosis, presenting with right upper quadrant and lower abdominal pain as well as nausea and vomiting -Emesis is without bile or blood No fever  Or chills  No diarrhea -No sick contacts at home -In the ED right upper quadrant ultrasound shows increased hepatic parenchyma suggestive of steatosis, no gallstones or evidence of acute cholecystitisdilated common bile duct MRI/MRCP recommended- -CT abdomen and pelvis suggetive of cholelithiasis, hepatic steatosis -MRI/MRCP pending -Elevated LFTs noted with AST of 466 ALT of 135 alkaline phosphatase of 467 T. bili of 4.6 anion gap of 20, bicarb of 18 serum glucose of 88 sodium of 129 potassium 3.3 now down to 2.9, creatinine 0.49 -Magnesium 1.5   -Hemoglobin 8.5 down from 10.7 yesterday platelets 109 down from 127 , WBC 4.9 -TSH and PT/INR [facility]   Hospital Course:   Brief Narrative:  52 y.o. female with past medical history relevant for necrotizing fasciitis status post diverting colostomy in June 2021 , GERD,, prior GI bleed, history of alcohol abuse with concerns for possible early liver cirrhosis admitted on 06/16/2019 for persistent elevated LFTs     -Assessment and Plan: 1)Transaminitis--??  Etiology -Reports abstinence from alcohol for the last couple of years -No fevers or leukocytosis right upper quadrant ultrasound shows  increased hepatic parenchyma suggestive of steatosis, no gallstones or evidence of acute cholecystitisdilated common bile duct MRI/MRCP recommended- -CT abdomen and pelvis suggestive of cholelithiasis, hepatic steatosis -MRI/MRCP on 05/30/23  with mild CBD dilation, severe hepatomegaly, cirrhosis, biliary sludge but no findings of choledocholithiasis or cholecystitis  -GI input appreciated -PETH testing (pending) , if PETH is negative patient may need liver biopsy In future given consistently elevated liver enzymes  -LFTs trending down, INR down to 1.2 -GI team recommends outpatient follow-up    Latest Ref Rng & Units 06/02/2023    4:25 AM 06/01/2023    4:56 AM 05/31/2023    5:06 AM  Hepatic Function  Total Protein 6.5 - 8.1 g/dL 5.9  5.9  5.8   Albumin 3.5 - 5.0 g/dL 2.1  2.0  2.1   AST 15 - 41 U/L 158  194  241   ALT 0 - 44 U/L 77  83  88   Alk Phosphatase 38 - 126 U/L 262  279  301   Total Bilirubin <1.2 mg/dL 5.4  4.2  3.9    2)Hyponatremia/hypokalemia/hypomagnesemia--- in the setting of GI losses -Electrolytes replaced -Potassium normalized -Sodium is up to 134 from 129 on admission -Avoid  dehydration   3) acute on chronic symptomatic anemia and thrombocytopenia--due to liver pathology -Patient also with concerns for possible ongoing GI bleed/peptic ulcer disease -Hgb up to 8.6 from 7.1 after transfusion of 1 unit of PRBC on 06/01/2023 -No obvious bleeding noted at this time  -Platelets up to 114 from 102  -INR is down to 1.2 -Overall patient is now pancytopenic   4)Class 2 Obesity--- -Low calorie diet, portion control and increase physical activity discussed with patient -Body mass index is 36.57 kg/m.   5)PUD/GERD-- She had EGD on 04/20/23 with clean based pyloric ulcer of 13mm.  continue Protonix, avoid NSAIDs -she will need outpatient capsule endoscopy  Discharge Condition: stable  Follow UP   Follow-up Information     Lanelle Bal, DO. Schedule an  appointment as soon as possible for a visit in 1 week(s).   Specialty: Gastroenterology Contact information: [address] [place] [address] [phone]                  Consults obtained - Gi  Diet and Activity recommendation:  As advised  Discharge Instructions   Discharge Instructions     Call MD for:  difficulty breathing, headache or visual disturbances   Complete by: As directed    Call MD for:  persistant dizziness or light-headedness   Complete by: As directed    Call MD for:  persistant nausea and vomiting   Complete by: As directed    Call MD for:  temperature >100.4   Complete by: As directed    Diet - low sodium heart healthy   Complete by: As directed    Discharge instructions   Complete by: As directed    1)Repeat CBC and CMP blood test within a week advised 2)Follow-up Gastroenterologist Dr. Earnest Bailey with [facility]--- for possible capsule endoscopy and further re-evaluation  -address: [address], [place], [place] [address], Phone: [phone] 3)Avoid ibuprofen/Advil/Aleve/Motrin/Goody Powders/Naproxen/BC powders/Meloxicam/Diclofenac/Indomethacin and other Nonsteroidal anti-inflammatory medications as these will make you more likely to bleed and can cause stomach ulcers, can also cause Kidney problems.   Increase activity slowly   Complete by: As directed        Discharge Medications     Allergies as of 06/02/2023       Reactions   Pantoprazole    Stomach pain   Penicillins Hives   Oxycodone Hcl Rash        Medication List     TAKE these medications    ascorbic acid 500 MG tablet Commonly known as: VITAMIN C Take 500 mg by mouth daily as needed.   cholestyramine 4 g packet Commonly known as: QUESTRAN Take 1 packet (4 g total) by mouth daily as needed (diarrhea).   ferrous sulfate 325 (65 FE) MG EC tablet Take 1 tablet (325 mg total) by mouth daily with breakfast.   fluticasone 50 MCG/ACT  nasal spray Commonly known as: FLONASE Place 2 sprays into both nostrils as needed for allergies or rhinitis.   folic acid 1 MG tablet Commonly known as: FOLVITE Take 1 tablet (1 mg total) by mouth daily.   gabapentin 100 MG capsule Commonly known as: NEURONTIN Take 1 capsule (100 mg total) by mouth at bedtime as needed (pain).   Gerhardt's butt cream Crea Apply 1 Application topically 4 (four) times daily as needed for irritation.   hydrocortisone 2.5 % rectal cream Commonly known as: ANUSOL-HC Place 1 Application rectally as needed for hemorrhoids or anal itching.  hydrOXYzine 25 MG tablet Commonly known as: ATARAX Take 1 tablet (25 mg total) by mouth as needed for itching.   loratadine 10 MG tablet Commonly known as: CLARITIN Take 1 tablet (10 mg total) by mouth daily. What changed:  when to take this reasons to take this   montelukast 10 MG tablet Commonly known as: SINGULAIR Take 1 tablet (10 mg total) by mouth at bedtime.   pantoprazole 40 MG tablet Commonly known as: Protonix Take 1 tablet (40 mg total) by mouth 2 (two) times daily.   traZODone 50 MG tablet Commonly known as: DESYREL Take 1 tablet (50 mg total) by mouth at bedtime as needed for sleep (insomnia).        Major procedures and Radiology Reports - PLEASE review detailed and final reports for all details, in brief -   MR ABDOMEN MRCP W WO CONTAST  Result Date: 05/31/2023 CLINICAL DATA:  52 year old female with history of right upper quadrant abdominal pain, nausea and dilated common bile duct noted on recent ultrasound examination. EXAM: MRI ABDOMEN WITHOUT AND WITH CONTRAST (INCLUDING MRCP) TECHNIQUE: Multiplanar multisequence MR imaging of the abdomen was performed both before and after the administration of intravenous contrast. Heavily T2-weighted images of the biliary and pancreatic ducts were obtained, and three-dimensional MRCP images were rendered by post processing. CONTRAST:  7.76mL  GADAVIST GADOBUTROL 1 MMOL/ML IV SOLN COMPARISON:  No prior abdominal MRI. CT of the abdomen and pelvis 05/30/2023. Right upper quadrant abdominal ultrasound 05/30/2023. FINDINGS: Lower chest: Unremarkable. Hepatobiliary: Liver is diffusely enlarged measuring up to 22.5 cm in craniocaudal span. Liver has a slightly nodular contour, concerning for developing cirrhosis. Severe diffuse loss of signal intensity throughout the hepatic parenchyma on out of phase dual echo images, indicative of a background of severe hepatic steatosis. No suspicious cystic or solid hepatic lesions are noted. Small filling defects are noted within the gallbladder, compatible with gallstones and/or trace amount of biliary sludge. Largest gallstone measures 5 mm. Gallbladder is moderately distended. Gallbladder wall thickness appears normal. No pericholecystic fluid or surrounding inflammatory changes. No intrahepatic biliary ductal dilatation. Common bile duct measures up to 7 mm in the porta hepatis (minimally dilated). No filling defect within the common bile duct to suggest choledocholithiasis. Pancreas: No pancreatic mass. No pancreatic ductal dilatation. No pancreatic or peripancreatic fluid collections or inflammatory changes. Spleen:  Unremarkable. Adrenals/Urinary Tract: In the lower pole of the left kidney (coronal image 22 of series 8) there is an exophytic 1.4 cm T1 hypointense, T2 hyperintense, nonenhancing lesion compatible with a simple (Bosniak class 1) cysts which requires no imaging follow-up. Right kidney and bilateral adrenal glands are otherwise normal in appearance. No hydroureteronephrosis in the visualized portions of the abdomen. Stomach/Bowel: Visualized portions are unremarkable. Vascular/Lymphatic: No aneurysm identified in the visualized abdominal vasculature. No lymphadenopathy noted in the abdomen. Other: No significant volume of ascites noted in the visualized portions of the peritoneal cavity. Musculoskeletal:  No aggressive appearing osseous lesions are noted in the visualized portions of the skeleton. IMPRESSION: 1. Although there is mild dilatation of the common bile duct (7 mm), there is no intrahepatic biliary ductal dilatation. Additionally, there is no choledocholithiasis or other findings to suggest obstruction. 2. Severe hepatomegaly with severe hepatic steatosis in nodular contour of the liver suggesting developing cirrhosis. 3. Trace amount of biliary sludge and small gallstones lying dependently in the gallbladder. No findings to suggest an acute cholecystitis at this time. Electronically Signed   By: Trudie Reed M.D.   On:  05/31/2023 06:44   MR 3D Recon At Scanner  Result Date: 05/31/2023 CLINICAL DATA:  52 year old female with history of right upper quadrant abdominal pain, nausea and dilated common bile duct noted on recent ultrasound examination. EXAM: MRI ABDOMEN WITHOUT AND WITH CONTRAST (INCLUDING MRCP) TECHNIQUE: Multiplanar multisequence MR imaging of the abdomen was performed both before and after the administration of intravenous contrast. Heavily T2-weighted images of the biliary and pancreatic ducts were obtained, and three-dimensional MRCP images were rendered by post processing. CONTRAST:  7.71mL GADAVIST GADOBUTROL 1 MMOL/ML IV SOLN COMPARISON:  No prior abdominal MRI. CT of the abdomen and pelvis 05/30/2023. Right upper quadrant abdominal ultrasound 05/30/2023. FINDINGS: Lower chest: Unremarkable. Hepatobiliary: Liver is diffusely enlarged measuring up to 22.5 cm in craniocaudal span. Liver has a slightly nodular contour, concerning for developing cirrhosis. Severe diffuse loss of signal intensity throughout the hepatic parenchyma on out of phase dual echo images, indicative of a background of severe hepatic steatosis. No suspicious cystic or solid hepatic lesions are noted. Small filling defects are noted within the gallbladder, compatible with gallstones and/or trace amount of  biliary sludge. Largest gallstone measures 5 mm. Gallbladder is moderately distended. Gallbladder wall thickness appears normal. No pericholecystic fluid or surrounding inflammatory changes. No intrahepatic biliary ductal dilatation. Common bile duct measures up to 7 mm in the porta hepatis (minimally dilated). No filling defect within the common bile duct to suggest choledocholithiasis. Pancreas: No pancreatic mass. No pancreatic ductal dilatation. No pancreatic or peripancreatic fluid collections or inflammatory changes. Spleen:  Unremarkable. Adrenals/Urinary Tract: In the lower pole of the left kidney (coronal image 22 of series 8) there is an exophytic 1.4 cm T1 hypointense, T2 hyperintense, nonenhancing lesion compatible with a simple (Bosniak class 1) cysts which requires no imaging follow-up. Right kidney and bilateral adrenal glands are otherwise normal in appearance. No hydroureteronephrosis in the visualized portions of the abdomen. Stomach/Bowel: Visualized portions are unremarkable. Vascular/Lymphatic: No aneurysm identified in the visualized abdominal vasculature. No lymphadenopathy noted in the abdomen. Other: No significant volume of ascites noted in the visualized portions of the peritoneal cavity. Musculoskeletal: No aggressive appearing osseous lesions are noted in the visualized portions of the skeleton. IMPRESSION: 1. Although there is mild dilatation of the common bile duct (7 mm), there is no intrahepatic biliary ductal dilatation. Additionally, there is no choledocholithiasis or other findings to suggest obstruction. 2. Severe hepatomegaly with severe hepatic steatosis in nodular contour of the liver suggesting developing cirrhosis. 3. Trace amount of biliary sludge and small gallstones lying dependently in the gallbladder. No findings to suggest an acute cholecystitis at this time. Electronically Signed   By: Trudie Reed M.D.   On: 05/31/2023 06:44   US Abdomen Limited RUQ  (LIVER/GB)  Result Date: 05/30/2023 CLINICAL DATA:  Right upper quadrant pain EXAM: ULTRASOUND ABDOMEN LIMITED RIGHT UPPER QUADRANT COMPARISON:  MR abdomen 05/30/2023 FINDINGS: Gallbladder: No gallstones or wall thickening visualized. No sonographic Murphy sign noted by sonographer. Common bile duct: Diameter: 10 mm, dilated Liver: Increased echogenicity. No focal lesion. Portal vein is patent on color Doppler imaging with normal direction of blood flow towards the liver. Other: None. IMPRESSION: 1. No cholelithiasis or sonographic evidence for acute cholecystitis. 2. Increased hepatic parenchymal echogenicity suggestive of steatosis. 3. Common bile duct markedly dilated, correlate with LFTs. If signs of obstruction, recommend MRI/MRCP. Electronically Signed   By: Annia Belt M.D.   On: 05/30/2023 08:49   CT ABDOMEN PELVIS WO CONTRAST  Result Date: 05/30/2023 CLINICAL DATA:  Epigastric  pain. EXAM: CT ABDOMEN AND PELVIS WITHOUT CONTRAST TECHNIQUE: Multidetector CT imaging of the abdomen and pelvis was performed following the standard protocol without IV contrast. RADIATION DOSE REDUCTION: This exam was performed according to the departmental dose-optimization program which includes automated exposure control, adjustment of the mA and/or kV according to patient size and/or use of iterative reconstruction technique. COMPARISON:  March 09, 2023 FINDINGS: Lower chest: No acute abnormality. Hepatobiliary: The liver is enlarged with diffuse fatty infiltration of the liver parenchyma. No focal liver abnormality is seen. A solitary 4 mm gallstone is seen without evidence of gallbladder wall thickening, pericholecystic inflammation or biliary dilatation. Pancreas: Unremarkable. No pancreatic ductal dilatation or surrounding inflammatory changes. Spleen: Normal in size without focal abnormality. Adrenals/Urinary Tract: Adrenal glands are unremarkable. Kidneys are normal, without renal calculi, focal lesion, or  hydronephrosis. Bladder is unremarkable. Stomach/Bowel: Stomach is within normal limits. Appendix appears normal. Surgically anastomosed bowel is seen within the junction of the descending and sigmoid colon. No evidence of bowel wall thickening, distention, or inflammatory changes. Vascular/Lymphatic: No significant vascular findings are present. No enlarged abdominal or pelvic lymph nodes. Reproductive: Uterus and bilateral adnexa are unremarkable. Other: A 1.5 cm x 2.6 cm x 2.2 cm fat containing ventral hernia is seen along the midline of the mid to upper abdomen. No abdominopelvic ascites. Musculoskeletal: A stable appearing postoperative soft tissue deformity is seen along the posterior perineum and perianal region, to the left of midline. IMPRESSION: 1. Hepatomegaly and hepatic steatosis. 2. Cholelithiasis. 3. Small fat containing ventral hernia. 4. Stable appearing postoperative soft tissue deformity along the posterior perineum and perianal region, to the left of midline. Electronically Signed   By: Aram Candela M.D.   On: 05/30/2023 02:54    Today   Subjective    Alyssa Carey today has no new concerns  - Patient with chronic diarrhea but she takes cholestyramine -No fever  Or chills  -Voiding well -No bleeding concerns   Patient has been seen and examined prior to discharge   Objective   Blood pressure 99/61, pulse 97, temperature 98.3 F (36.8 C), temperature source Oral, resp. rate 18, height 4\' 9"  (1.448 m), weight 76.7 kg, SpO2 100%.   Intake/Output Summary (Last 24 hours) at 06/02/2023 1230 Last data filed at 06/02/2023 0853 Gross per 24 hour  Intake 1205 ml  Output --  Net 1205 ml   Exam Gen:- Awake Alert, no acute distress  HEENT:- [place].AT, +ve sclera icterus Neck-Supple Neck,No JVD,.  Lungs-  CTAB , good air movement bilaterally CV- S1, S2 normal, regular Abd-  +ve B.Sounds, Abd Soft, No significant tenderness,    Extremity/Skin:- No  edema,   good  pulses Psych-affect is appropriate, oriented x3 Neuro-no new focal deficits, no tremors    Data Review   CBC w Diff:  Lab Results  Component Value Date   WBC 3.8 (L) 06/02/2023   HGB 8.6 (L) 06/02/2023   HCT 25.6 (L) 06/02/2023   PLT 114 (L) 06/02/2023   LYMPHOPCT 13 05/30/2023   BANDSPCT 2 08/18/2021   MONOPCT 7 05/30/2023   EOSPCT 0 05/30/2023   BASOPCT 0 05/30/2023   CMP:  Lab Results  Component Value Date   NA 134 (L) 06/02/2023   K 4.0 06/02/2023   CL 103 06/02/2023   CO2 22 06/02/2023   BUN <5 (L) 06/02/2023   CREATININE 0.43 (L) 06/02/2023   CREATININE 0.47 (L) 12/02/2020   PROT 5.9 (L) 06/02/2023   ALBUMIN 2.1 (L) 06/02/2023  BILITOT 5.4 (H) 06/02/2023   ALKPHOS 262 (H) 06/02/2023   AST 158 (H) 06/02/2023   ALT 77 (H) 06/02/2023   Total Discharge time is about 33 minutes  Shon Hale M.D on 06/02/2023 at 12:30 PM  Go to [email or web address] -  for contact info  [facility] - Office  [phone]

## 2023-06-02 NOTE — Plan of Care (Signed)
  Problem: Clinical Measurements: Goal: Diagnostic test results will improve Outcome: Not Progressing   Problem: Elimination: Goal: Will not experience complications related to bowel motility Outcome: Not Progressing   Problem: Pain Management: Goal: General experience of comfort will improve Outcome: Not Progressing   Problem: Safety: Goal: Ability to remain free from injury will improve Outcome: Not Progressing

## 2023-06-02 NOTE — Plan of Care (Signed)
  Problem: Activity: Goal: Risk for activity intolerance will decrease Outcome: Progressing   Problem: Nutrition: Goal: Adequate nutrition will be maintained Outcome: Progressing   Problem: Pain Management: Goal: General experience of comfort will improve Outcome: Progressing   Problem: Safety: Goal: Ability to remain free from injury will improve Outcome: Progressing   Problem: Skin Integrity: Goal: Risk for impaired skin integrity will decrease Outcome: Progressing

## 2023-06-02 NOTE — Discharge Instructions (Signed)
1)Repeat CBC and CMP blood test within a week advised 2)Follow-up Gastroenterologist Dr. Earnest Bailey with [facility]--- for possible capsule endoscopy and further re-evaluation  -address: [address], [place], [place] [address], Phone: [phone] 3)Avoid ibuprofen/Advil/Aleve/Motrin/Goody Powders/Naproxen/BC powders/Meloxicam/Diclofenac/Indomethacin and other Nonsteroidal anti-inflammatory medications as these will make you more likely to bleed and can cause stomach ulcers, can also cause Kidney problems.

## 2023-06-02 NOTE — Progress Notes (Signed)
AVS given and reviewed with pt. Medications discussed. Work note provided to pt. All questions answered to satisfaction. Pt verbalized understanding of information given. Pt escorted off the unit with all belongings via wheelchair by staff member.

## 2023-06-04 LAB — MISC LABCORP TEST (SEND OUT): Labcorp test code: 791584

## 2023-06-04 NOTE — Telephone Encounter (Signed)
Per 05/03/23 note: [facility] has been approved via insurance. Valid dates 05/01/23-06/30/23. Left message to return call to schedule.   Left message to return call

## 2023-06-07 NOTE — Progress Notes (Signed)
Phosphatidylethanol (PEth)   1205 ng/mL is positive which suggest moderate to heavy alcohol consumption.  We will see the patient in clinic and reinforce alcohol cessation which is likely the cause of elevated liver enzymes

## 2023-06-11 ENCOUNTER — Encounter (HOSPITAL_COMMUNITY): Payer: Self-pay | Admitting: Radiology

## 2023-06-11 ENCOUNTER — Emergency Department (HOSPITAL_COMMUNITY)
Admission: EM | Admit: 2023-06-11 | Discharge: 2023-06-12 | Disposition: A | Discharge status: Home / Self Care | Payer: Medicaid Other | Attending: Emergency Medicine | Admitting: Emergency Medicine

## 2023-06-11 ENCOUNTER — Other Ambulatory Visit: Payer: Self-pay

## 2023-06-11 DIAGNOSIS — R42 Dizziness and giddiness: Secondary | ICD-10-CM | POA: Insufficient documentation

## 2023-06-11 DIAGNOSIS — Z1152 Encounter for screening for COVID-19: Secondary | ICD-10-CM | POA: Insufficient documentation

## 2023-06-11 DIAGNOSIS — Z79899 Other long term (current) drug therapy: Secondary | ICD-10-CM | POA: Insufficient documentation

## 2023-06-11 DIAGNOSIS — R070 Pain in throat: Secondary | ICD-10-CM | POA: Insufficient documentation

## 2023-06-11 DIAGNOSIS — I10 Essential (primary) hypertension: Secondary | ICD-10-CM | POA: Insufficient documentation

## 2023-06-11 MED ORDER — ALUM & MAG HYDROXIDE-SIMETH 200-200-20 MG/5ML PO SUSP
30.0000 mL | Freq: Once | ORAL | Status: AC
  Administered 2023-06-11: 30 mL via ORAL
  Filled 2023-06-11: qty 30

## 2023-06-11 MED ORDER — LIDOCAINE VISCOUS HCL 2 % MT SOLN
15.0000 mL | Freq: Once | OROMUCOSAL | Status: AC
  Administered 2023-06-11: 15 mL via ORAL
  Filled 2023-06-11: qty 15

## 2023-06-11 MED ORDER — SODIUM CHLORIDE 0.9 % IV BOLUS
1000.0000 mL | Freq: Once | INTRAVENOUS | Status: AC
  Administered 2023-06-11: 1000 mL via INTRAVENOUS

## 2023-06-11 MED ORDER — KETOROLAC TROMETHAMINE 30 MG/ML IJ SOLN
30.0000 mg | Freq: Once | INTRAMUSCULAR | Status: AC
  Administered 2023-06-11: 30 mg via INTRAVENOUS
  Filled 2023-06-11: qty 1

## 2023-06-11 NOTE — ED Triage Notes (Signed)
Pt states that she started feeling bad two days ago, pt states it started with lose of appetite, pt states she has been vomiting and now her throat feels like it has razors in it. Pt states every time she gets up to move around she gets very light headed.

## 2023-06-11 NOTE — ED Provider Notes (Signed)
[place] EMERGENCY DEPARTMENT AT [facility] Provider Note   CSN: [id number] Arrival date & time: 06/11/23  2209     History  Chief Complaint  Patient presents with   Dizziness    Alyssa Carey is a 52 y.o. female.  Patient is a 52 year old female with past medical history of alcoholic hepatitis, hypertension, peptic ulcer disease.  Patient presenting today for evaluation of burning in her throat.  She describes discomfort from her throat down to her stomach that is worse when she eats or drinks.  She denies any fevers or chills, but reports feeling dizzy and generally unwell.  She denies any ill contacts.  No diarrhea or constipation.  No alleviating factors.  The history is provided by the patient.       Home Medications Prior to Admission medications   Medication Sig Start Date End Date Taking? Authorizing Provider  ascorbic acid (VITAMIN C) 500 MG tablet Take 500 mg by mouth daily as needed.    [provider]  cholestyramine (QUESTRAN) 4 g packet Take 1 packet (4 g total) by mouth daily as needed (diarrhea). 06/02/23   Shon Hale, MD  ferrous sulfate 325 (65 FE) MG EC tablet Take 1 tablet (325 mg total) by mouth daily with breakfast. 06/02/23   Emokpae, Courage, MD  fluticasone (FLONASE) 50 MCG/ACT nasal spray Place 2 sprays into both nostrils as needed for allergies or rhinitis. 09/15/22   [provider]  folic acid (FOLVITE) 1 MG tablet Take 1 tablet (1 mg total) by mouth daily. 06/02/23   Shon Hale, MD  gabapentin (NEURONTIN) 100 MG capsule Take 1 capsule (100 mg total) by mouth at bedtime as needed (pain). 06/02/23   Shon Hale, MD  hydrocortisone (ANUSOL-HC) 2.5 % rectal cream Place 1 Application rectally as needed for hemorrhoids or anal itching. 06/02/23   Shon Hale, MD  hydrOXYzine (ATARAX) 25 MG tablet Take 1 tablet (25 mg total) by mouth as needed for itching. 06/02/23   Shon Hale, MD  loratadine  (CLARITIN) 10 MG tablet Take 1 tablet (10 mg total) by mouth daily. Patient taking differently: Take 10 mg by mouth daily as needed for allergies. 11/14/22   Vassie Loll, MD  montelukast (SINGULAIR) 10 MG tablet Take 1 tablet (10 mg total) by mouth at bedtime. 06/02/23   Shon Hale, MD  Nystatin (GERHARDT'S BUTT CREAM) CREA Apply 1 Application topically 4 (four) times daily as needed for irritation. 11/13/22   Vassie Loll, MD  pantoprazole (PROTONIX) 40 MG tablet Take 1 tablet (40 mg total) by mouth 2 (two) times daily. 06/02/23 06/01/24  Shon Hale, MD  traZODone (DESYREL) 50 MG tablet Take 1 tablet (50 mg total) by mouth at bedtime as needed for sleep (insomnia). 06/02/23   Shon Hale, MD      Allergies    Pantoprazole, Penicillins, and Oxycodone hcl    Review of Systems   Review of Systems  All other systems reviewed and are negative.   Physical Exam Updated Vital Signs BP 102/72 (BP Location: Right Arm)   Pulse (!) 103   Temp 98.6 F (37 C) (Oral)   Resp 18   Ht 4\' 10"  (1.473 m)   Wt 75.8 kg   SpO2 99%   BMI 34.90 kg/m  Physical Exam Vitals and nursing note reviewed.  Constitutional:      General: She is not in acute distress.    Appearance: She is well-developed. She is not diaphoretic.  HENT:  Head: Normocephalic and atraumatic.  Cardiovascular:     Rate and Rhythm: Normal rate and regular rhythm.     Heart sounds: No murmur heard.    No friction rub. No gallop.  Pulmonary:     Effort: Pulmonary effort is normal. No respiratory distress.     Breath sounds: Normal breath sounds. No wheezing.  Abdominal:     General: Bowel sounds are normal. There is no distension.     Palpations: Abdomen is soft.     Tenderness: There is no abdominal tenderness.  Musculoskeletal:        General: Normal range of motion.     Cervical back: Normal range of motion and neck supple.  Skin:    General: Skin is warm and dry.  Neurological:     General: No  focal deficit present.     Mental Status: She is alert and oriented to person, place, and time.     ED Results / Procedures / Treatments   Labs (all labs ordered are listed, but only abnormal results are displayed) Labs Reviewed - No data to display  EKG None  Radiology No results found.  Procedures Procedures  {Document cardiac monitor, telemetry assessment procedure when appropriate:1}  Medications Ordered in ED Medications  sodium chloride 0.9 % bolus 1,000 mL (has no administration in time range)  alum & mag hydroxide-simeth (MAALOX/MYLANTA) 200-200-20 MG/5ML suspension 30 mL (has no administration in time range)    And  lidocaine (XYLOCAINE) 2 % viscous mouth solution 15 mL (has no administration in time range)    ED Course/ Medical Decision Making/ A&P   {   Click here for ABCD2, HEART and other calculatorsREFRESH Note before signing :1}                              Medical Decision Making Amount and/or Complexity of Data Reviewed Labs: ordered.  Risk OTC drugs. Prescription drug management.   ***  {Document critical care time when appropriate:1} {Document review of labs and clinical decision tools ie heart score, Chads2Vasc2 etc:1}  {Document your independent review of radiology images, and any outside records:1} {Document your discussion with family members, caretakers, and with consultants:1} {Document social determinants of health affecting pt's care:1} {Document your decision making why or why not admission, treatments were needed:1} Final Clinical Impression(s) / ED Diagnoses Final diagnoses:  None    Rx / DC Orders ED Discharge Orders     None

## 2023-06-12 LAB — RESP PANEL BY RT-PCR (RSV, FLU A&B, COVID)  RVPGX2
Influenza A by PCR: NEGATIVE
Influenza B by PCR: NEGATIVE
Resp Syncytial Virus by PCR: NEGATIVE
SARS Coronavirus 2 by RT PCR: NEGATIVE

## 2023-06-12 LAB — CBC WITH DIFFERENTIAL/PLATELET
Abs Immature Granulocytes: 0.02 10*3/uL (ref 0.00–0.07)
Basophils Absolute: 0 10*3/uL (ref 0.0–0.1)
Basophils Relative: 1 %
Eosinophils Absolute: 0.1 10*3/uL (ref 0.0–0.5)
Eosinophils Relative: 1 %
HCT: 26.5 % — ABNORMAL LOW (ref 36.0–46.0)
Hemoglobin: 9 g/dL — ABNORMAL LOW (ref 12.0–15.0)
Immature Granulocytes: 0 %
Lymphocytes Relative: 12 %
Lymphs Abs: 0.8 10*3/uL (ref 0.7–4.0)
MCH: 31.4 pg (ref 26.0–34.0)
MCHC: 34 g/dL (ref 30.0–36.0)
MCV: 92.3 fL (ref 80.0–100.0)
Monocytes Absolute: 0.4 10*3/uL (ref 0.1–1.0)
Monocytes Relative: 6 %
Neutro Abs: 5 10*3/uL (ref 1.7–7.7)
Neutrophils Relative %: 80 %
Platelets: 113 10*3/uL — ABNORMAL LOW (ref 150–400)
RBC: 2.87 MIL/uL — ABNORMAL LOW (ref 3.87–5.11)
RDW: 17 % — ABNORMAL HIGH (ref 11.5–15.5)
WBC: 6.3 10*3/uL (ref 4.0–10.5)
nRBC: 0 % (ref 0.0–0.2)

## 2023-06-12 LAB — COMPREHENSIVE METABOLIC PANEL
ALT: 48 U/L — ABNORMAL HIGH (ref 0–44)
AST: 99 U/L — ABNORMAL HIGH (ref 15–41)
Albumin: 1.9 g/dL — ABNORMAL LOW (ref 3.5–5.0)
Alkaline Phosphatase: 192 U/L — ABNORMAL HIGH (ref 38–126)
Anion gap: 7 (ref 5–15)
BUN: <5 mg/dL — ABNORMAL LOW (ref 6–20)
CO2: 23 mmol/L (ref 22–32)
Calcium: 7.7 mg/dL — ABNORMAL LOW (ref 8.9–10.3)
Chloride: 100 mmol/L (ref 98–111)
Creatinine, Ser: 0.42 mg/dL — ABNORMAL LOW (ref 0.44–1.00)
GFR, Estimated: >60 mL/min (ref >60)
Glucose, Bld: 132 mg/dL — ABNORMAL HIGH (ref 70–99)
Potassium: 2.9 mmol/L — ABNORMAL LOW (ref 3.5–5.1)
Sodium: 130 mmol/L — ABNORMAL LOW (ref 135–145)
Total Bilirubin: 11.9 mg/dL — ABNORMAL HIGH (ref <1.2)
Total Protein: 6.4 g/dL — ABNORMAL LOW (ref 6.5–8.1)

## 2023-06-12 LAB — LIPASE, BLOOD: Lipase: 20 U/L (ref 11–51)

## 2023-06-12 LAB — GROUP A STREP BY PCR: Group A Strep by PCR: NOT DETECTED

## 2023-06-12 MED ORDER — SUCRALFATE 1 G PO TABS: 1.0000 g | ORAL_TABLET | Freq: Three times a day (TID) | ORAL | 0 refills | Status: DC

## 2023-06-12 NOTE — Telephone Encounter (Signed)
Left message to return call 

## 2023-06-12 NOTE — Discharge Instructions (Signed)
Begin taking Carafate as prescribed.  Follow-up with your gastroenterologist or primary doctor in the next few days for a repeat of your liver function tests.  Return to the ER if your symptoms worsen or change.

## 2023-06-22 ENCOUNTER — Ambulatory Visit (INDEPENDENT_AMBULATORY_CARE_PROVIDER_SITE_OTHER): Payer: Medicaid Other | Admitting: Gastroenterology

## 2023-08-28 ENCOUNTER — Ambulatory Visit (HOSPITAL_BASED_OUTPATIENT_CLINIC_OR_DEPARTMENT_OTHER): Payer: Medicaid Other | Admitting: General Surgery

## 2023-09-18 ENCOUNTER — Ambulatory Visit (INDEPENDENT_AMBULATORY_CARE_PROVIDER_SITE_OTHER): Payer: Medicaid Other | Admitting: Gastroenterology

## 2023-10-17 ENCOUNTER — Encounter (HOSPITAL_BASED_OUTPATIENT_CLINIC_OR_DEPARTMENT_OTHER): Payer: Medicaid Other | Attending: General Surgery | Admitting: General Surgery

## 2023-10-17 DIAGNOSIS — L98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: Secondary | ICD-10-CM | POA: Insufficient documentation

## 2023-10-17 DIAGNOSIS — K709 Alcoholic liver disease, unspecified: Secondary | ICD-10-CM | POA: Diagnosis not present

## 2023-10-31 ENCOUNTER — Encounter (HOSPITAL_BASED_OUTPATIENT_CLINIC_OR_DEPARTMENT_OTHER): Admitting: General Surgery

## 2023-10-31 DIAGNOSIS — L98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: Secondary | ICD-10-CM | POA: Diagnosis not present

## 2023-11-14 ENCOUNTER — Encounter (HOSPITAL_BASED_OUTPATIENT_CLINIC_OR_DEPARTMENT_OTHER): Admitting: General Surgery

## 2023-11-14 DIAGNOSIS — L98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: Secondary | ICD-10-CM | POA: Diagnosis not present

## 2023-11-28 ENCOUNTER — Encounter (HOSPITAL_BASED_OUTPATIENT_CLINIC_OR_DEPARTMENT_OTHER): Attending: General Surgery | Admitting: Internal Medicine

## 2023-11-28 DIAGNOSIS — K709 Alcoholic liver disease, unspecified: Secondary | ICD-10-CM | POA: Insufficient documentation

## 2023-11-28 DIAGNOSIS — L98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: Secondary | ICD-10-CM | POA: Diagnosis present

## 2023-12-07 ENCOUNTER — Other Ambulatory Visit (HOSPITAL_COMMUNITY)
Admission: RE | Admit: 2023-12-07 | Discharge: 2023-12-07 | Disposition: A | Discharge status: Home / Self Care | Source: Ambulatory Visit | Attending: Gastroenterology | Admitting: Gastroenterology

## 2023-12-07 ENCOUNTER — Ambulatory Visit (INDEPENDENT_AMBULATORY_CARE_PROVIDER_SITE_OTHER): Admitting: Gastroenterology

## 2023-12-07 ENCOUNTER — Encounter (INDEPENDENT_AMBULATORY_CARE_PROVIDER_SITE_OTHER): Payer: Self-pay | Admitting: Gastroenterology

## 2023-12-07 VITALS — BP 155/99 | HR 89 | Temp 98.3°F | Ht <= 58 in | Wt 164.1 lb

## 2023-12-07 DIAGNOSIS — K7030 Alcoholic cirrhosis of liver without ascites: Secondary | ICD-10-CM | POA: Insufficient documentation

## 2023-12-07 DIAGNOSIS — K838 Other specified diseases of biliary tract: Secondary | ICD-10-CM | POA: Insufficient documentation

## 2023-12-07 DIAGNOSIS — D500 Iron deficiency anemia secondary to blood loss (chronic): Secondary | ICD-10-CM | POA: Diagnosis present

## 2023-12-07 DIAGNOSIS — K259 Gastric ulcer, unspecified as acute or chronic, without hemorrhage or perforation: Secondary | ICD-10-CM

## 2023-12-07 DIAGNOSIS — R7989 Other specified abnormal findings of blood chemistry: Secondary | ICD-10-CM

## 2023-12-07 DIAGNOSIS — K7460 Unspecified cirrhosis of liver: Secondary | ICD-10-CM | POA: Diagnosis not present

## 2023-12-07 DIAGNOSIS — D649 Anemia, unspecified: Secondary | ICD-10-CM

## 2023-12-07 DIAGNOSIS — R748 Abnormal levels of other serum enzymes: Secondary | ICD-10-CM

## 2023-12-07 DIAGNOSIS — F1090 Alcohol use, unspecified, uncomplicated: Secondary | ICD-10-CM

## 2023-12-07 LAB — CBC WITH DIFFERENTIAL/PLATELET
Abs Immature Granulocytes: 0.01 10*3/uL (ref 0.00–0.07)
Basophils Absolute: 0 10*3/uL (ref 0.0–0.1)
Basophils Relative: 0 %
Eosinophils Absolute: 0.1 10*3/uL (ref 0.0–0.5)
Eosinophils Relative: 2 %
HCT: 33 % — ABNORMAL LOW (ref 36.0–46.0)
Hemoglobin: 10.7 g/dL — ABNORMAL LOW (ref 12.0–15.0)
Immature Granulocytes: 0 %
Lymphocytes Relative: 36 %
Lymphs Abs: 2.2 10*3/uL (ref 0.7–4.0)
MCH: 27.6 pg (ref 26.0–34.0)
MCHC: 32.4 g/dL (ref 30.0–36.0)
MCV: 85.1 fL (ref 80.0–100.0)
Monocytes Absolute: 0.2 10*3/uL (ref 0.1–1.0)
Monocytes Relative: 4 %
Neutro Abs: 3.5 10*3/uL (ref 1.7–7.7)
Neutrophils Relative %: 58 %
Platelets: 325 10*3/uL (ref 150–400)
RBC: 3.88 MIL/uL (ref 3.87–5.11)
RDW: 12.9 % (ref 11.5–15.5)
WBC: 6 10*3/uL (ref 4.0–10.5)
nRBC: 0 % (ref 0.0–0.2)

## 2023-12-07 LAB — VITAMIN B12: Vitamin B-12: 295 pg/mL (ref 180–914)

## 2023-12-07 LAB — IRON AND TIBC
Iron: 47 ug/dL (ref 28–170)
Saturation Ratios: 15 % (ref 10.4–31.8)
TIBC: 312 ug/dL (ref 250–450)
UIBC: 265 ug/dL

## 2023-12-07 LAB — COMPREHENSIVE METABOLIC PANEL WITH GFR
ALT: 11 U/L (ref 0–44)
AST: 16 U/L (ref 15–41)
Albumin: 3.4 g/dL — ABNORMAL LOW (ref 3.5–5.0)
Alkaline Phosphatase: 138 U/L — ABNORMAL HIGH (ref 38–126)
Anion gap: 9 (ref 5–15)
BUN: 14 mg/dL (ref 6–20)
CO2: 23 mmol/L (ref 22–32)
Calcium: 8.9 mg/dL (ref 8.9–10.3)
Chloride: 102 mmol/L (ref 98–111)
Creatinine, Ser: 0.59 mg/dL (ref 0.44–1.00)
GFR, Estimated: >60 mL/min (ref >60)
Glucose, Bld: 90 mg/dL (ref 70–99)
Potassium: 3.6 mmol/L (ref 3.5–5.1)
Sodium: 134 mmol/L — ABNORMAL LOW (ref 135–145)
Total Bilirubin: 0.3 mg/dL (ref 0.0–1.2)
Total Protein: 6.9 g/dL (ref 6.5–8.1)

## 2023-12-07 LAB — FERRITIN: Ferritin: 31 ng/mL (ref 11–307)

## 2023-12-07 LAB — PROTIME-INR
INR: 1 (ref 0.8–1.2)
Prothrombin Time: 13.3 s (ref 11.4–15.2)

## 2023-12-07 LAB — FOLATE: Folate: 7.1 ng/mL (ref >5.9)

## 2023-12-07 NOTE — Patient Instructions (Signed)
 It was very nice to meet you today, as dicussed with will plan for the following :  1) labwork and ultrasound  2) Upper endoscopy - See the surgeons about the hernia

## 2023-12-07 NOTE — Progress Notes (Signed)
 Alyssa Carey , M.D. Gastroenterology & Hepatology [facility] Gastroenterology [address] [place], [place] [address] Primary Care Physician: Leesa Pulling, MD [address] [place] [address]  Chief Complaint:  Cirrhosis /Elevated liver enzymes /PUD / Hernia   History of Present Illness:  This is a 53  y.o. female with history of necrotizing fasciitis s/p diverting colostomy June 2021 complicated by parastomal hernia, small bowel obstruction January/February 2023, previous GI bleed due to peptic ulcer disease, July 2023 she underwent lap assisted colostomy takedown and partial colectomy , compensated cirrhosis presented for follow up of  elevated liver enzymes ,cirrhosis ,    AST more than ALT.last viral hepatitis profile checked in 05/2023 hep A nonimmune hep B negative and nonimmune hep C negative.  ANA , AMA negative.  Normal alpha-1 antitrypsin . Recent Peth positive also previous positive alcohol screening blood .  Patient continues to deny alcohol use  MRI abdomen MRCP with and without contrast done 05/30/2023 with mild CBD dilation, severe hepatomegaly, cirrhosis, biliary sludge but no findings of choledocholithiasis or cholecystitis  Patient had 3 admissions last year  recurrent hematochezia and possible melena.    Patient reports that her stool is brown now, Bristol stool scale 4 without any diarrhea and denies any abdominal pain.  Patient denies using any NSAID and any alcohol for past 1 year.  Patient was admitted on 04/17/2023 at [facility] with rectal bleeding.  At that time her hemoglobin dropped to 7.8 CTA was negative for any active bleeding.  Patient underwent upper endoscopy on 04/20/2023 by Dr. Lexine Redder found to have clean-based pyloric ulcer 13 mm  Patient is concerned about abdominal hernia, denies any pain swelling redness  Last EGD 04/2023 Patient underwent upper endoscopy on 04/20/2023 by Dr. Lexine Redder found to have clean-based pyloric  ulcer 13 mm  Biopsies negative H.Pylori    EGD 02/2023:- Normal esophagus.                           - Z-line regular.                           - Non-bleeding gastric ulcer with a clean ulcer                            base (Forrest Class III). Biopsied.                           - Normal duodenal bulb and second portion of the                            duodenum. Last Colon 02/2023;- The examined portion of the ileum was normal.                           - A few erosions in the sigmoid colon. Biopsied.                           - Diverticulosis in the left colon. There was no                            evidence of diverticular bleeding.                           -  Erythematous mucosa in the rectum. Biopsied.                           - One 3 mm polyp in the descending colon, removed                            with a cold biopsy forceps. Resected and retrieved.                           - Non-bleeding external and internal hemorrhoids.                           -Sacral decubitus stage 2 or stage 3 with clear                            Discharge  Repeat 5 years   Past Medical History: Past Medical History:  Diagnosis Date   Alcohol abuse    HTN (hypertension) 10/05/2020   Necrotizing fasciitis [facility])     Past Surgical History: Past Surgical History:  Procedure Laterality Date   BIOPSY  10/07/2020   Procedure: BIOPSY;  Surgeon: Suzette Espy, MD;  [facility];  Service: Endoscopy;;   BIOPSY  03/10/2023   Procedure: BIOPSY;  Surgeon: Hargis Lias, MD;  [facility];  Service: Endoscopy;;   COLONOSCOPY WITH PROPOFOL  N/A 10/07/2020   single healing rectal ulcer in distal rectum with surrounding mucosal friability and markedly abnormal proximal colon with ulceration s/p biopsies.   COLONOSCOPY WITH PROPOFOL   02/22/2021   Procedure: COLONOSCOPY WITH PROPOFOL ;  Surgeon: Vinetta Greening, DO;  [facility];  Service: Endoscopy;;   COLONOSCOPY  WITH PROPOFOL  N/A 03/10/2023   Procedure: COLONOSCOPY WITH PROPOFOL ;  Surgeon: Hargis Lias, MD;  [facility];  Service: Endoscopy;  Laterality: N/A;   COLOSTOMY TAKEDOWN N/A 01/26/2022   Procedure: LAPAROSCOPIC COLOSTOMY TAKEDOWN;  Surgeon: Joyce Nixon, MD;  Location: [facility];  Service: General;  Laterality: N/A;  laparoscopic assisted colostomy reversal and partial colectomy   ESOPHAGOGASTRODUODENOSCOPY (EGD) WITH PROPOFOL  N/A 10/07/2020   non-bleeding gastric ulcer . Pathology with H.pylori negative   ESOPHAGOGASTRODUODENOSCOPY (EGD) WITH PROPOFOL  N/A 12/27/2020   Procedure: ESOPHAGOGASTRODUODENOSCOPY (EGD) WITH PROPOFOL ;  Surgeon: Vinetta Greening, DO;  [facility];  Service: Endoscopy;  Laterality: N/A;  1:00pm   ESOPHAGOGASTRODUODENOSCOPY (EGD) WITH PROPOFOL  N/A 01/17/2023   Procedure: ESOPHAGOGASTRODUODENOSCOPY (EGD) WITH PROPOFOL ;  Surgeon: Suzette Espy, MD;  [facility];  Service: Endoscopy;  Laterality: N/A;   ESOPHAGOGASTRODUODENOSCOPY (EGD) WITH PROPOFOL  N/A 03/10/2023   Procedure: ESOPHAGOGASTRODUODENOSCOPY (EGD) WITH PROPOFOL ;  Surgeon: Hargis Lias, MD;  [facility];  Service: Endoscopy;  Laterality: N/A;   FLEXIBLE SIGMOIDOSCOPY N/A 01/11/2020   Procedure: FLEXIBLE SIGMOIDOSCOPY;  Surgeon: Alvis Jourdain, MD;  [facility];  Service: Gastroenterology;  Laterality: N/A;   INCISION AND DRAINAGE PERIRECTAL ABSCESS N/A 12/25/2019   Procedure: IRRIGATION AND DEBRIDEMENT BUTTOCKS, LAP LOOP COLOSTOMY;  Surgeon: Aldean Hummingbird, MD;  Location: [facility];  Service: General;  Laterality: N/A;   IRRIGATION AND DEBRIDEMENT ABSCESS N/A 12/23/2019   Procedure: EXCISION AND DEBRIDEMENT LEFT BUTTOCK AND PERINEUM;  Surgeon: Adalberto Acton, MD;  Location: [facility];  Service: General;  Laterality: N/A;   LAPAROSCOPIC LOOP COLOSTOMY N/A  12/25/2019   Procedure: LAPAROSCOPIC LOOP COLOSTOMY;  Surgeon: Aldean Hummingbird, MD;  Location: [facility];   Service: General;  Laterality: N/A;   POLYPECTOMY  03/10/2023   Procedure: POLYPECTOMY;  Surgeon: Hargis Lias, MD;  [facility];  Service: Endoscopy;;   TONSILLECTOMY      Family History: Family History  Problem Relation Age of Onset   Colon polyps Mother        does not believe adenomas   Arrhythmia Mother    Colon cancer Neg Hx     Social History: Social History   Tobacco Use  Smoking Status Never   Passive exposure: Never  Smokeless Tobacco Never   Social History   Substance and Sexual Activity  Alcohol Use Not Currently   Social History   Substance and Sexual Activity  Drug Use Not Currently    Allergies: Allergies  Allergen Reactions   Pantoprazole      Stomach pain   Penicillins Hives   Oxycodone  Hcl Rash    Medications: Current Outpatient Medications  Medication Sig Dispense Refill   ascorbic acid  (VITAMIN C ) 500 MG tablet Take 500 mg by mouth daily as needed.     cholestyramine  (QUESTRAN ) 4 g packet Take 1 packet (4 g total) by mouth daily as needed (diarrhea). 30 each 3   cyclobenzaprine (FLEXERIL) 10 MG tablet Take 10 mg by mouth every 6 (six) hours as needed.     ferrous sulfate  325 (65 FE) MG EC tablet Take 1 tablet (325 mg total) by mouth daily with breakfast. 30 tablet 3   fluticasone  (FLONASE ) 50 MCG/ACT nasal spray Place 2 sprays into both nostrils as needed for allergies or rhinitis.     folic acid  (FOLVITE ) 1 MG tablet Take 1 tablet (1 mg total) by mouth daily. 30 tablet 5   gabapentin  (NEURONTIN ) 100 MG capsule Take 1 capsule (100 mg total) by mouth at bedtime as needed (pain). 30 capsule 1   hydrocortisone  (ANUSOL -HC) 2.5 % rectal cream Place 1 Application rectally as needed for hemorrhoids or anal itching. 28 g 2   hydrOXYzine  (ATARAX ) 25 MG tablet Take 1 tablet (25 mg total) by mouth as needed for itching. 30 tablet 3   ibuprofen  (ADVIL ) 800 MG tablet Take 800 mg by mouth every 8 (eight) hours as needed.     loperamide   (IMODIUM ) 2 MG capsule Take 2 mg by mouth 4 (four) times daily as needed.     loratadine  (CLARITIN ) 10 MG tablet Take 1 tablet (10 mg total) by mouth daily. (Patient taking differently: Take 10 mg by mouth daily as needed for allergies.) 30 tablet 1   meloxicam (MOBIC) 15 MG tablet Take 15 mg by mouth daily.     montelukast  (SINGULAIR ) 10 MG tablet Take 1 tablet (10 mg total) by mouth at bedtime. 30 tablet 3   Nystatin  (GERHARDT'S BUTT CREAM) CREA Apply 1 Application topically 4 (four) times daily as needed for irritation.     pantoprazole  (PROTONIX ) 40 MG tablet Take 1 tablet (40 mg total) by mouth 2 (two) times daily. 60 tablet 3   sucralfate  (CARAFATE ) 1 g tablet Take 1 tablet (1 g total) by mouth 4 (four) times daily -  with meals and at bedtime. 120 tablet 0   SUMAtriptan (IMITREX) 25 MG tablet Take 25 mg by mouth once.     traZODone  (DESYREL ) 50 MG tablet Take 1 tablet (50 mg total) by mouth at bedtime as needed for sleep (insomnia). 30 tablet 2  No current facility-administered medications for this visit.    Review of Systems: GENERAL: negative for malaise, night sweats HEENT: No changes in hearing or vision, no nose bleeds or other nasal problems. NECK: Negative for lumps, goiter, pain and significant neck swelling RESPIRATORY: Negative for cough, wheezing CARDIOVASCULAR: Negative for chest pain, leg swelling, palpitations, orthopnea GI: SEE HPI MUSCULOSKELETAL: Negative for joint pain or swelling, back pain, and muscle pain. SKIN: Negative for lesions, rash HEMATOLOGY Negative for prolonged bleeding, bruising easily, and swollen nodes. ENDOCRINE: Negative for cold or heat intolerance, polyuria, polydipsia and goiter. NEURO: negative for tremor, gait imbalance, syncope and seizures. The remainder of the review of systems is noncontributory.   Physical Exam: BP (!) 155/99 (BP Location: Right Arm, Patient Position: Sitting, Cuff Size: Large)   Pulse 89   Temp 98.3 F (36.8 C)  (Oral)   Ht 4\' 9"  (1.448 m)   Wt 164 lb 1.6 oz (74.4 kg)   LMP  (LMP Unknown)   SpO2 99%   BMI 35.51 kg/m  GENERAL: The patient is AO x3, in no acute distress. HEENT: Head is normocephalic and atraumatic. EOMI are intact. Mouth is well hydrated and without lesions. NECK: Supple. No masses LUNGS: Clear to auscultation. No presence of rhonchi/wheezing/rales. Adequate chest expansion HEART: RRR, normal s1 and s2. ABDOMEN: Soft, nontender, no guarding, no peritoneal signs, and nondistended. BS +. No masses. EXTREMITIES: Without any cyanosis, clubbing, rash, lesions or edema. NEUROLOGIC: AOx3, no focal motor deficit. SKIN: no jaundice, no rashes   Imaging/Labs: as above     Latest Ref Rng & Units 06/12/2023   12:22 AM 06/02/2023    4:25 AM 06/01/2023    4:56 AM  CBC  WBC 4.0 - 10.5 K/uL 6.3  3.8  3.6   Hemoglobin 12.0 - 15.0 g/dL 9.0  8.6  7.1   Hematocrit 36.0 - 46.0 % 26.5  25.6  22.9   Platelets 150 - 400 K/uL 113  114  102    Lab Results  Component Value Date   IRON 232 (H) 04/04/2023   TIBC 301 04/04/2023   FERRITIN 44 04/04/2023    I personally reviewed and interpreted the available labs, imaging and endoscopic files.  MRI MRCP    IMPRESSION: 1. Although there is mild dilatation of the common bile duct (7 mm), there is no intrahepatic biliary ductal dilatation. Additionally, there is no choledocholithiasis or other findings to suggest obstruction. 2. Severe hepatomegaly with severe hepatic steatosis in nodular contour of the liver suggesting developing cirrhosis. 3. Trace amount of biliary sludge and small gallstones lying dependently in the gallbladder. No findings to suggest an acute cholecystitis at this time.  Impression and Plan:  This is a 53  y.o. female with history of necrotizing fasciitis s/p diverting colostomy June 2021 complicated by parastomal hernia, small bowel obstruction January/February 2023, previous GI bleed due to peptic ulcer disease,  July 2023 she underwent lap assisted colostomy takedown and partial colectomy , compensated cirrhosis presented for follow up of  elevated liver enzymes ,cirrhosis ,    #PUD #Normocytic anemia   Patient had at least 3 admissions last year hematochezia and possible melena.  Most recent admission  at [facility] health where she underwent upper endoscopy found to have 13 mm clean-based gastric ulcer, biopsies again negative for H. Pylori. Patient had colonoscopy 02/2023 with diverticulosis no overt bleeding and normal TI  Patient has been taking PPI and denies taking NSAIDs.    Will obtain upper endoscopy with small  bowel biopsies to evaluate healing of the peptic ulcer  Obtain iron profile, vitamin B12 and folate level, repeat CBC.  Depending on upper endoscopy results and blood work may need small bowel capsule endoscopy in future  Omeprazole  daily 30 minutes before breakfast and avoid all NSAIDs  #Compensated cirrhosis #Elevated liver enzymes #CBD Dilation    MELD 3.0: 21 at 06/02/2023  4:25 AM MELD-Na: 17 at 06/02/2023  4:25 AM Calculated from: Serum Creatinine: 0.43 mg/dL (Using min of 1 mg/dL) at 16/04/9603  5:40 AM Serum Sodium: 134 mmol/L at 06/02/2023  4:25 AM Total Bilirubin: 5.4 mg/dL at 98/05/9146  8:29 AM Serum Albumin : 2.1 g/dL at 56/21/3086  5:78 AM INR(ratio): 1.2 at 06/02/2023  4:25 AM Age at listing (hypothetical): 52 years Sex: Female at 06/02/2023  4:25 AM   #Eitology :    Likely etiology is alcohol use.  AST more than ALT.last viral hepatitis profile checked in 2024 hep A nonimmune hep B negative and nonimmune hep C negative.  ANA , AMA negative,ASMA  Normal alpha-1 antitrypsin Phosphatidylethanol (PEth)     1205 ng/mL is positive which suggest moderate to heavy alcohol consumption.; even though patient continues to deny ETOH use    Last T. 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  Will repeat CMP and if remains elevated may need MRI MRCP  # Hepatic  encephalopathy None - Avoid opiates or benzodiazepines   # Ascites None on exam and imaging   # Esophageal varices No varices on recent upper endoscopy   # HCC screening Mri 05/2023 without any focal lesion.    Recommendation   -Patient need to be enrolled in every 6 month HCC screening with ultrasound and AFP -Repeat iron studies given significantly elevated Tsat  - PETH testing , if PETH is negative patient may beed liver biopsy In future given consistently elevated liver enzymes  - Repeat MELD Labs  -Hep A and B vaccination to be done by PCP  - Depending on repeat labs, MELD score may need to be referred for transplant evaluation in future   Patient is concerned about abdominal hernia advised patient to follow-up with surgeons who performed the surgery, she would rather like to follow-up in [place]  Repeat colonoscopy in 5 years for surveillance.    All questions were answered.      Hyman Crossan Faizan Iara Monds, MD Gastroenterology and Hepatology [facility] Gastroenterology   This chart has been completed using [facility] Dictation software, and while attempts have been made to ensure accuracy , certain words and phrases may not be transcribed as intended

## 2023-12-07 NOTE — H&P (View-Only) (Signed)
 Alyssa Carey , M.D. Gastroenterology & Hepatology [facility] Gastroenterology [address] [place], [place] [address] Primary Care Physician: Leesa Pulling, MD [address] [place] [address]  Chief Complaint:  Cirrhosis /Elevated liver enzymes /PUD / Hernia   History of Present Illness:  This is a 53  y.o. female with history of necrotizing fasciitis s/p diverting colostomy June 2021 complicated by parastomal hernia, small bowel obstruction January/February 2023, previous GI bleed due to peptic ulcer disease, July 2023 she underwent lap assisted colostomy takedown and partial colectomy , compensated cirrhosis presented for follow up of  elevated liver enzymes ,cirrhosis ,    AST more than ALT.last viral hepatitis profile checked in 05/2023 hep A nonimmune hep B negative and nonimmune hep C negative.  ANA , AMA negative.  Normal alpha-1 antitrypsin . Recent Peth positive also previous positive alcohol screening blood .  Patient continues to deny alcohol use  MRI abdomen MRCP with and without contrast done 05/30/2023 with mild CBD dilation, severe hepatomegaly, cirrhosis, biliary sludge but no findings of choledocholithiasis or cholecystitis  Patient had 3 admissions last year  recurrent hematochezia and possible melena.    Patient reports that her stool is brown now, Bristol stool scale 4 without any diarrhea and denies any abdominal pain.  Patient denies using any NSAID and any alcohol for past 1 year.  Patient was admitted on 04/17/2023 at [facility] with rectal bleeding.  At that time her hemoglobin dropped to 7.8 CTA was negative for any active bleeding.  Patient underwent upper endoscopy on 04/20/2023 by Dr. Lexine Redder found to have clean-based pyloric ulcer 13 mm  Patient is concerned about abdominal hernia, denies any pain swelling redness  Last EGD 04/2023 Patient underwent upper endoscopy on 04/20/2023 by Dr. Lexine Redder found to have clean-based pyloric  ulcer 13 mm  Biopsies negative H.Pylori    EGD 02/2023:- Normal esophagus.                           - Z-line regular.                           - Non-bleeding gastric ulcer with a clean ulcer                            base (Forrest Class III). Biopsied.                           - Normal duodenal bulb and second portion of the                            duodenum. Last Colon 02/2023;- The examined portion of the ileum was normal.                           - A few erosions in the sigmoid colon. Biopsied.                           - Diverticulosis in the left colon. There was no                            evidence of diverticular bleeding.                           -  Erythematous mucosa in the rectum. Biopsied.                           - One 3 mm polyp in the descending colon, removed                            with a cold biopsy forceps. Resected and retrieved.                           - Non-bleeding external and internal hemorrhoids.                           -Sacral decubitus stage 2 or stage 3 with clear                            Discharge  Repeat 5 years   Past Medical History: Past Medical History:  Diagnosis Date   Alcohol abuse    HTN (hypertension) 10/05/2020   Necrotizing fasciitis [facility])     Past Surgical History: Past Surgical History:  Procedure Laterality Date   BIOPSY  10/07/2020   Procedure: BIOPSY;  Surgeon: Suzette Espy, MD;  [facility];  Service: Endoscopy;;   BIOPSY  03/10/2023   Procedure: BIOPSY;  Surgeon: Hargis Lias, MD;  [facility];  Service: Endoscopy;;   COLONOSCOPY WITH PROPOFOL  N/A 10/07/2020   single healing rectal ulcer in distal rectum with surrounding mucosal friability and markedly abnormal proximal colon with ulceration s/p biopsies.   COLONOSCOPY WITH PROPOFOL   02/22/2021   Procedure: COLONOSCOPY WITH PROPOFOL ;  Surgeon: Vinetta Greening, DO;  [facility];  Service: Endoscopy;;   COLONOSCOPY  WITH PROPOFOL  N/A 03/10/2023   Procedure: COLONOSCOPY WITH PROPOFOL ;  Surgeon: Hargis Lias, MD;  [facility];  Service: Endoscopy;  Laterality: N/A;   COLOSTOMY TAKEDOWN N/A 01/26/2022   Procedure: LAPAROSCOPIC COLOSTOMY TAKEDOWN;  Surgeon: Joyce Nixon, MD;  [facility]: [facility];  Service: General;  Laterality: N/A;  laparoscopic assisted colostomy reversal and partial colectomy   ESOPHAGOGASTRODUODENOSCOPY (EGD) WITH PROPOFOL  N/A 10/07/2020   non-bleeding gastric ulcer . Pathology with H.pylori negative   ESOPHAGOGASTRODUODENOSCOPY (EGD) WITH PROPOFOL  N/A 12/27/2020   Procedure: ESOPHAGOGASTRODUODENOSCOPY (EGD) WITH PROPOFOL ;  Surgeon: Vinetta Greening, DO;  [facility];  Service: Endoscopy;  Laterality: N/A;  1:00pm   ESOPHAGOGASTRODUODENOSCOPY (EGD) WITH PROPOFOL  N/A 01/17/2023   Procedure: ESOPHAGOGASTRODUODENOSCOPY (EGD) WITH PROPOFOL ;  Surgeon: Suzette Espy, MD;  [facility];  Service: Endoscopy;  Laterality: N/A;   ESOPHAGOGASTRODUODENOSCOPY (EGD) WITH PROPOFOL  N/A 03/10/2023   Procedure: ESOPHAGOGASTRODUODENOSCOPY (EGD) WITH PROPOFOL ;  Surgeon: Hargis Lias, MD;  [facility];  Service: Endoscopy;  Laterality: N/A;   FLEXIBLE SIGMOIDOSCOPY N/A 01/11/2020   Procedure: FLEXIBLE SIGMOIDOSCOPY;  Surgeon: Alvis Jourdain, MD;  [facility];  Service: Gastroenterology;  Laterality: N/A;   INCISION AND DRAINAGE PERIRECTAL ABSCESS N/A 12/25/2019   Procedure: IRRIGATION AND DEBRIDEMENT BUTTOCKS, LAP LOOP COLOSTOMY;  Surgeon: Aldean Hummingbird, MD;  Location[facility];  Service: General;  Laterality: N/A;   IRRIGATION AND DEBRIDEMENT ABSCESS N/A 12/23/2019   Procedure: EXCISION AND DEBRIDEMENT LEFT BUTTOCK AND PERINEUM;  Surgeon: Adalberto Acton, MD;  Location: [facility];  Service: General;  Laterality: N/A;   LAPAROSCOPIC LOOP COLOSTOMY N/A  12/25/2019   Procedure: LAPAROSCOPIC LOOP COLOSTOMY;  Surgeon: Aldean Hummingbird, MD;  Location: [facility];   Service: General;  Laterality: N/A;   POLYPECTOMY  03/10/2023   Procedure: POLYPECTOMY;  Surgeon: Hargis Lias, MD;  [facility];  Service: Endoscopy;;   TONSILLECTOMY      Family History: Family History  Problem Relation Age of Onset   Colon polyps Mother        does not believe adenomas   Arrhythmia Mother    Colon cancer Neg Hx     Social History: Social History   Tobacco Use  Smoking Status Never   Passive exposure: Never  Smokeless Tobacco Never   Social History   Substance and Sexual Activity  Alcohol Use Not Currently   Social History   Substance and Sexual Activity  Drug Use Not Currently    Allergies: Allergies  Allergen Reactions   Pantoprazole      Stomach pain   Penicillins Hives   Oxycodone  Hcl Rash    Medications: Current Outpatient Medications  Medication Sig Dispense Refill   ascorbic acid  (VITAMIN C ) 500 MG tablet Take 500 mg by mouth daily as needed.     cholestyramine  (QUESTRAN ) 4 g packet Take 1 packet (4 g total) by mouth daily as needed (diarrhea). 30 each 3   cyclobenzaprine (FLEXERIL) 10 MG tablet Take 10 mg by mouth every 6 (six) hours as needed.     ferrous sulfate  325 (65 FE) MG EC tablet Take 1 tablet (325 mg total) by mouth daily with breakfast. 30 tablet 3   fluticasone  (FLONASE ) 50 MCG/ACT nasal spray Place 2 sprays into both nostrils as needed for allergies or rhinitis.     folic acid  (FOLVITE ) 1 MG tablet Take 1 tablet (1 mg total) by mouth daily. 30 tablet 5   gabapentin  (NEURONTIN ) 100 MG capsule Take 1 capsule (100 mg total) by mouth at bedtime as needed (pain). 30 capsule 1   hydrocortisone  (ANUSOL -HC) 2.5 % rectal cream Place 1 Application rectally as needed for hemorrhoids or anal itching. 28 g 2   hydrOXYzine  (ATARAX ) 25 MG tablet Take 1 tablet (25 mg total) by mouth as needed for itching. 30 tablet 3   ibuprofen  (ADVIL ) 800 MG tablet Take 800 mg by mouth every 8 (eight) hours as needed.     loperamide   (IMODIUM ) 2 MG capsule Take 2 mg by mouth 4 (four) times daily as needed.     loratadine  (CLARITIN ) 10 MG tablet Take 1 tablet (10 mg total) by mouth daily. (Patient taking differently: Take 10 mg by mouth daily as needed for allergies.) 30 tablet 1   meloxicam (MOBIC) 15 MG tablet Take 15 mg by mouth daily.     montelukast  (SINGULAIR ) 10 MG tablet Take 1 tablet (10 mg total) by mouth at bedtime. 30 tablet 3   Nystatin  (GERHARDT'S BUTT CREAM) CREA Apply 1 Application topically 4 (four) times daily as needed for irritation.     pantoprazole  (PROTONIX ) 40 MG tablet Take 1 tablet (40 mg total) by mouth 2 (two) times daily. 60 tablet 3   sucralfate  (CARAFATE ) 1 g tablet Take 1 tablet (1 g total) by mouth 4 (four) times daily -  with meals and at bedtime. 120 tablet 0   SUMAtriptan (IMITREX) 25 MG tablet Take 25 mg by mouth once.     traZODone  (DESYREL ) 50 MG tablet Take 1 tablet (50 mg total) by mouth at bedtime as needed for sleep (insomnia). 30 tablet 2  No current facility-administered medications for this visit.    Review of Systems: GENERAL: negative for malaise, night sweats HEENT: No changes in hearing or vision, no nose bleeds or other nasal problems. NECK: Negative for lumps, goiter, pain and significant neck swelling RESPIRATORY: Negative for cough, wheezing CARDIOVASCULAR: Negative for chest pain, leg swelling, palpitations, orthopnea GI: SEE HPI MUSCULOSKELETAL: Negative for joint pain or swelling, back pain, and muscle pain. SKIN: Negative for lesions, rash HEMATOLOGY Negative for prolonged bleeding, bruising easily, and swollen nodes. ENDOCRINE: Negative for cold or heat intolerance, polyuria, polydipsia and goiter. NEURO: negative for tremor, gait imbalance, syncope and seizures. The remainder of the review of systems is noncontributory.   Physical Exam: BP (!) 155/99 (BP Location: Right Arm, Patient Position: Sitting, Cuff Size: Large)   Pulse 89   Temp 98.3 F (36.8 C)  (Oral)   Ht 4\' 9"  (1.448 m)   Wt 164 lb 1.6 oz (74.4 kg)   LMP  (LMP Unknown)   SpO2 99%   BMI 35.51 kg/m  GENERAL: The patient is AO x3, in no acute distress. HEENT: Head is normocephalic and atraumatic. EOMI are intact. Mouth is well hydrated and without lesions. NECK: Supple. No masses LUNGS: Clear to auscultation. No presence of rhonchi/wheezing/rales. Adequate chest expansion HEART: RRR, normal s1 and s2. ABDOMEN: Soft, nontender, no guarding, no peritoneal signs, and nondistended. BS +. No masses. EXTREMITIES: Without any cyanosis, clubbing, rash, lesions or edema. NEUROLOGIC: AOx3, no focal motor deficit. SKIN: no jaundice, no rashes   Imaging/Labs: as above     Latest Ref Rng & Units 06/12/2023   12:22 AM 06/02/2023    4:25 AM 06/01/2023    4:56 AM  CBC  WBC 4.0 - 10.5 K/uL 6.3  3.8  3.6   Hemoglobin 12.0 - 15.0 g/dL 9.0  8.6  7.1   Hematocrit 36.0 - 46.0 % 26.5  25.6  22.9   Platelets 150 - 400 K/uL 113  114  102    Lab Results  Component Value Date   IRON 232 (H) 04/04/2023   TIBC 301 04/04/2023   FERRITIN 44 04/04/2023    I personally reviewed and interpreted the available labs, imaging and endoscopic files.  MRI MRCP    IMPRESSION: 1. Although there is mild dilatation of the common bile duct (7 mm), there is no intrahepatic biliary ductal dilatation. Additionally, there is no choledocholithiasis or other findings to suggest obstruction. 2. Severe hepatomegaly with severe hepatic steatosis in nodular contour of the liver suggesting developing cirrhosis. 3. Trace amount of biliary sludge and small gallstones lying dependently in the gallbladder. No findings to suggest an acute cholecystitis at this time.  Impression and Plan:  This is a 53  y.o. female with history of necrotizing fasciitis s/p diverting colostomy June 2021 complicated by parastomal hernia, small bowel obstruction January/February 2023, previous GI bleed due to peptic ulcer disease,  July 2023 she underwent lap assisted colostomy takedown and partial colectomy , compensated cirrhosis presented for follow up of  elevated liver enzymes ,cirrhosis ,    #PUD #Normocytic anemia   Patient had at least 3 admissions last year hematochezia and possible melena.  Most recent admission  at [facility] where she underwent upper endoscopy found to have 13 mm clean-based gastric ulcer, biopsies again negative for H. Pylori. Patient had colonoscopy 02/2023 with diverticulosis no overt bleeding and normal TI  Patient has been taking PPI and denies taking NSAIDs.    Will obtain upper endoscopy with small  bowel biopsies to evaluate healing of the peptic ulcer  Obtain iron profile, vitamin B12 and folate level, repeat CBC.  Depending on upper endoscopy results and blood work may need small bowel capsule endoscopy in future  Omeprazole  daily 30 minutes before breakfast and avoid all NSAIDs  #Compensated cirrhosis #Elevated liver enzymes #CBD Dilation    MELD 3.0: 21 at 06/02/2023  4:25 AM MELD-Na: 17 at 06/02/2023  4:25 AM Calculated from: Serum Creatinine: 0.43 mg/dL (Using min of 1 mg/dL) at 16/04/9603  5:40 AM Serum Sodium: 134 mmol/L at 06/02/2023  4:25 AM Total Bilirubin: 5.4 mg/dL at 98/05/9146  8:29 AM Serum Albumin : 2.1 g/dL at 56/21/3086  5:78 AM INR(ratio): 1.2 at 06/02/2023  4:25 AM Age at listing (hypothetical): 52 years Sex: Female at 06/02/2023  4:25 AM   #Eitology :    Likely etiology is alcohol use.  AST more than ALT.last viral hepatitis profile checked in 2024 hep A nonimmune hep B negative and nonimmune hep C negative.  ANA , AMA negative,ASMA  Normal alpha-1 antitrypsin Phosphatidylethanol (PEth)     1205 ng/mL is positive which suggest moderate to heavy alcohol consumption.; even though patient continues to deny ETOH use    Last T. 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  Will repeat CMP and if remains elevated may need MRI MRCP  # Hepatic  encephalopathy None - Avoid opiates or benzodiazepines   # Ascites None on exam and imaging   # Esophageal varices No varices on recent upper endoscopy   # HCC screening Mri 05/2023 without any focal lesion.    Recommendation   -Patient need to be enrolled in every 6 month HCC screening with ultrasound and AFP -Repeat iron studies given significantly elevated Tsat  - PETH testing , if PETH is negative patient may beed liver biopsy In future given consistently elevated liver enzymes  - Repeat MELD Labs  -Hep A and B vaccination to be done by PCP  - Depending on repeat labs, MELD score may need to be referred for transplant evaluation in future   Patient is concerned about abdominal hernia advised patient to follow-up with surgeons who performed the surgery, she would rather like to follow-up in [place]  Repeat colonoscopy in 5 years for surveillance.    All questions were answered.      Alyssa Crossan Faizan Iara Monds, MD Gastroenterology and Hepatology [facility] Gastroenterology   This chart has been completed using [facility] Dictation software, and while attempts have been made to ensure accuracy , certain words and phrases may not be transcribed as intended

## 2023-12-08 LAB — AFP TUMOR MARKER: AFP, Serum, Tumor Marker: 2.1 ng/mL (ref 0.0–6.4)

## 2023-12-12 ENCOUNTER — Encounter (INDEPENDENT_AMBULATORY_CARE_PROVIDER_SITE_OTHER): Payer: Self-pay

## 2023-12-12 LAB — MISC LABCORP TEST (SEND OUT): Labcorp test code: 791584

## 2023-12-14 ENCOUNTER — Encounter (HOSPITAL_BASED_OUTPATIENT_CLINIC_OR_DEPARTMENT_OTHER): Admitting: General Surgery

## 2023-12-14 DIAGNOSIS — L98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: Secondary | ICD-10-CM | POA: Diagnosis not present

## 2023-12-18 ENCOUNTER — Ambulatory Visit (INDEPENDENT_AMBULATORY_CARE_PROVIDER_SITE_OTHER): Payer: Self-pay | Admitting: Gastroenterology

## 2023-12-18 ENCOUNTER — Telehealth (INDEPENDENT_AMBULATORY_CARE_PROVIDER_SITE_OTHER): Payer: Self-pay | Admitting: Gastroenterology

## 2023-12-18 ENCOUNTER — Ambulatory Visit (HOSPITAL_COMMUNITY)
Admission: RE | Admit: 2023-12-18 | Discharge: 2023-12-18 | Disposition: A | Discharge status: Home / Self Care | Source: Ambulatory Visit | Attending: Gastroenterology | Admitting: Gastroenterology

## 2023-12-18 DIAGNOSIS — K838 Other specified diseases of biliary tract: Secondary | ICD-10-CM | POA: Diagnosis present

## 2023-12-18 DIAGNOSIS — K7030 Alcoholic cirrhosis of liver without ascites: Secondary | ICD-10-CM | POA: Insufficient documentation

## 2023-12-18 DIAGNOSIS — R748 Abnormal levels of other serum enzymes: Secondary | ICD-10-CM | POA: Insufficient documentation

## 2023-12-18 DIAGNOSIS — D500 Iron deficiency anemia secondary to blood loss (chronic): Secondary | ICD-10-CM | POA: Diagnosis present

## 2023-12-18 NOTE — Progress Notes (Signed)
 Hi Tanya,  Can you please call the patient and tell the patient the lab work shows the previously elevated liver enzymes have almost completely normalized now and all of her lab work appears to have improved since last time, this is all good news  please advise patient not to have any alcohol   Thanks,  Habeeb Puertas Faizan Kayn Haymore, MD Gastroenterology and Hepatology [facility] Gastroenterology ==============   [facility] now negative and hence liver enzymes have almost normalized including T.Bili   HBG improved to 10.7   AFP:2.1  Vitamin b12 : 295 Folate 7.1  %sat:15   INR 1.0

## 2023-12-18 NOTE — Telephone Encounter (Signed)
 Patient returning call at 440pm. [phone]

## 2023-12-19 NOTE — Telephone Encounter (Signed)
 Left message to return call

## 2023-12-27 ENCOUNTER — Telehealth (INDEPENDENT_AMBULATORY_CARE_PROVIDER_SITE_OTHER): Payer: Self-pay | Admitting: Gastroenterology

## 2023-12-27 NOTE — Telephone Encounter (Signed)
 Patient returning call. [phone]

## 2023-12-27 NOTE — Telephone Encounter (Signed)
 Left message for pt. I spoke with pt last week. No new results at this time. Pt does have appt with wound care tomorrow. I stated on message that the phone call she received may have been an appt reminder.

## 2023-12-28 ENCOUNTER — Encounter (HOSPITAL_BASED_OUTPATIENT_CLINIC_OR_DEPARTMENT_OTHER): Admitting: General Surgery

## 2023-12-31 ENCOUNTER — Ambulatory Visit (INDEPENDENT_AMBULATORY_CARE_PROVIDER_SITE_OTHER): Payer: Self-pay | Admitting: Gastroenterology

## 2024-01-01 ENCOUNTER — Encounter (HOSPITAL_COMMUNITY): Payer: Self-pay

## 2024-01-01 ENCOUNTER — Other Ambulatory Visit: Payer: Self-pay

## 2024-01-01 ENCOUNTER — Encounter (HOSPITAL_COMMUNITY)
Admission: RE | Admit: 2024-01-01 | Discharge: 2024-01-01 | Disposition: A | Discharge status: Home / Self Care | Source: Ambulatory Visit | Attending: Gastroenterology | Admitting: Gastroenterology

## 2024-01-01 VITALS — Ht <= 58 in | Wt 162.0 lb

## 2024-01-01 DIAGNOSIS — Z01818 Encounter for other preprocedural examination: Secondary | ICD-10-CM

## 2024-01-04 ENCOUNTER — Other Ambulatory Visit: Payer: Self-pay

## 2024-01-04 ENCOUNTER — Ambulatory Visit (HOSPITAL_BASED_OUTPATIENT_CLINIC_OR_DEPARTMENT_OTHER): Payer: Self-pay | Admitting: Anesthesiology

## 2024-01-04 ENCOUNTER — Ambulatory Visit (HOSPITAL_COMMUNITY): Payer: Self-pay | Admitting: Anesthesiology

## 2024-01-04 ENCOUNTER — Encounter (HOSPITAL_COMMUNITY): Payer: Self-pay | Admitting: Gastroenterology

## 2024-01-04 ENCOUNTER — Ambulatory Visit (HOSPITAL_COMMUNITY)
Admission: RE | Admit: 2024-01-04 | Discharge: 2024-01-04 | Disposition: A | Discharge status: Home / Self Care | Attending: Gastroenterology | Admitting: Gastroenterology

## 2024-01-04 ENCOUNTER — Encounter (HOSPITAL_COMMUNITY)
Admission: RE | Disposition: A | Discharge status: Home / Self Care | Payer: Self-pay | Source: Home / Self Care | Attending: Gastroenterology

## 2024-01-04 DIAGNOSIS — K254 Chronic or unspecified gastric ulcer with hemorrhage: Secondary | ICD-10-CM | POA: Diagnosis present

## 2024-01-04 DIAGNOSIS — K648 Other hemorrhoids: Secondary | ICD-10-CM | POA: Diagnosis not present

## 2024-01-04 DIAGNOSIS — I10 Essential (primary) hypertension: Secondary | ICD-10-CM | POA: Diagnosis not present

## 2024-01-04 DIAGNOSIS — K2980 Duodenitis without bleeding: Secondary | ICD-10-CM | POA: Diagnosis not present

## 2024-01-04 DIAGNOSIS — I8510 Secondary esophageal varices without bleeding: Secondary | ICD-10-CM | POA: Insufficient documentation

## 2024-01-04 DIAGNOSIS — K2990 Gastroduodenitis, unspecified, without bleeding: Secondary | ICD-10-CM | POA: Diagnosis not present

## 2024-01-04 DIAGNOSIS — K31A19 Gastric intestinal metaplasia without dysplasia, unspecified site: Secondary | ICD-10-CM

## 2024-01-04 DIAGNOSIS — K257 Chronic gastric ulcer without hemorrhage or perforation: Secondary | ICD-10-CM | POA: Diagnosis not present

## 2024-01-04 DIAGNOSIS — K3189 Other diseases of stomach and duodenum: Secondary | ICD-10-CM

## 2024-01-04 DIAGNOSIS — K5730 Diverticulosis of large intestine without perforation or abscess without bleeding: Secondary | ICD-10-CM | POA: Diagnosis not present

## 2024-01-04 DIAGNOSIS — K2950 Unspecified chronic gastritis without bleeding: Secondary | ICD-10-CM | POA: Insufficient documentation

## 2024-01-04 DIAGNOSIS — K7460 Unspecified cirrhosis of liver: Secondary | ICD-10-CM | POA: Insufficient documentation

## 2024-01-04 DIAGNOSIS — Z9089 Acquired absence of other organs: Secondary | ICD-10-CM | POA: Diagnosis not present

## 2024-01-04 DIAGNOSIS — Z01818 Encounter for other preprocedural examination: Secondary | ICD-10-CM

## 2024-01-04 HISTORY — PX: ESOPHAGOGASTRODUODENOSCOPY: SHX5428

## 2024-01-04 LAB — POCT PREGNANCY, URINE: Preg Test, Ur: NEGATIVE

## 2024-01-04 SURGERY — EGD (ESOPHAGOGASTRODUODENOSCOPY)
Anesthesia: General

## 2024-01-04 MED ORDER — PROPOFOL 10 MG/ML IV BOLUS
INTRAVENOUS | Status: DC | PRN
  Administered 2024-01-04: 80 mg via INTRAVENOUS
  Administered 2024-01-04: 100 mg via INTRAVENOUS
  Administered 2024-01-04: 40 mg via INTRAVENOUS

## 2024-01-04 MED ORDER — PANTOPRAZOLE SODIUM 40 MG PO TBEC: 40.0000 mg | DELAYED_RELEASE_TABLET | Freq: Every day | ORAL | 3 refills | Status: DC

## 2024-01-04 MED ORDER — LIDOCAINE 2% (20 MG/ML) 5 ML SYRINGE
INTRAMUSCULAR | Status: DC | PRN
  Administered 2024-01-04: 80 mg via INTRAVENOUS

## 2024-01-04 MED ORDER — LACTATED RINGERS IV SOLN
INTRAVENOUS | Status: DC
  Administered 2024-01-04: 1000 mL via INTRAVENOUS

## 2024-01-04 NOTE — Op Note (Signed)
 [facility] Patient Name: Alyssa Carey Procedure Date: 01/04/2024 11:37 AM MRN: [id number] Date of Birth: 1971/01/13 Attending MD: Terril Fetters , MD, [id number] CSN: [id number] Age: 53 Admit Type: Outpatient Procedure:                Upper GI endoscopy Indications:              Follow-up of chronic gastric ulcer Providers:                Terril Fetters, MD, Vonna Guardian, Theola Fitch Referring MD:              Medicines:                Monitored Anesthesia Care Complications:            No immediate complications. Estimated Blood Loss:     Estimated blood loss was minimal. Procedure:                Pre-Anesthesia Assessment:                           - Prior to the procedure, a History and Physical                            was performed, and patient medications and                            allergies were reviewed. The patient's tolerance of                            previous anesthesia was also reviewed. The risks                            and benefits of the procedure and the sedation                            options and risks were discussed with the patient.                            All questions were answered, and informed consent                            was obtained. Prior Anticoagulants: The patient has                            taken no anticoagulant or antiplatelet agents. ASA                            Grade Assessment: III - A patient with severe                            systemic disease. After reviewing the risks and                            benefits, the patient was deemed in satisfactory  condition to undergo the procedure.                           After obtaining informed consent, the endoscope was                            passed under direct vision. Throughout the                            procedure, the patient's blood pressure, pulse, and                            oxygen saturations were monitored continuously. The                             [id number]-[id number] ([id number]) scope was introduced through the                            mouth, and advanced to the second part of duodenum.                            The upper GI endoscopy was accomplished without                            difficulty. The patient tolerated the procedure                            well. Scope In: 12:02:26 PM Scope Out: 12:07:27 PM Total Procedure Duration: 0 hours 5 minutes 1 second  Findings:      The examined esophagus was normal.      A medium amount of food (residue) was found in the gastric body.      Moderate inflammation characterized by erythema was found in the       stomach. Biopsies were taken with a cold forceps for histology.      Moderate inflammation was found in the duodenal bulb and in the second       portion of the duodenum. Biopsies were taken with a cold forceps for       histology. Impression:               - Normal esophagus.                           - A medium amount of food (residue) in the stomach.                           - Gastritis. Biopsied.                           - Duodenitis. Biopsied. Moderate Sedation:      Per Anesthesia Care Recommendation:           - Patient has a contact number available for                            emergencies. The signs and symptoms of potential  delayed complications were discussed with the                            patient. Return to normal activities tomorrow.                            Written discharge instructions were provided to the                            patient.                           - Resume previous diet.                           - Continue present medications.                           - Await pathology results. Procedure Code(s):        --- Professional ---                           [phone], Esophagogastroduodenoscopy, flexible,                            transoral; with biopsy, single or multiple Diagnosis Code(s):         --- Professional ---                           K29.70, Gastritis, unspecified, without bleeding                           K29.80, Duodenitis without bleeding                           K25.7, Chronic gastric ulcer without hemorrhage or                            perforation CPT copyright 2022 American Medical Association. All rights reserved. The codes documented in this report are preliminary and upon coder review may  be revised to meet current compliance requirements. Terril Fetters, MD Terril Fetters, MD 01/04/2024 12:20:32 PM This report has been signed electronically. Number of Addenda: 0

## 2024-01-04 NOTE — Anesthesia Preprocedure Evaluation (Signed)
 Anesthesia Evaluation  Patient identified by MRN, date of birth, ID band Patient awake    Reviewed: Allergy & Precautions, H&P , NPO status , Patient's Chart, lab work & pertinent test results, reviewed documented beta blocker date and time   Airway Mallampati: II  TM Distance: >3 FB Neck ROM: full    Dental no notable dental hx.    Pulmonary neg pulmonary ROS   Pulmonary exam normal breath sounds clear to auscultation       Cardiovascular Exercise Tolerance: Good hypertension, negative cardio ROS  Rhythm:regular Rate:Normal     Neuro/Psych  PSYCHIATRIC DISORDERS      negative neurological ROS  negative psych ROS   GI/Hepatic negative GI ROS, Neg liver ROS, PUD,,,(+) Hepatitis -  Endo/Other  negative endocrine ROS    Renal/GU Renal diseasenegative Renal ROS  negative genitourinary   Musculoskeletal   Abdominal   Peds  Hematology negative hematology ROS (+) Blood dyscrasia, anemia   Anesthesia Other Findings   Reproductive/Obstetrics negative OB ROS                             Anesthesia Physical Anesthesia Plan  ASA: 2  Anesthesia Plan: General   Post-op Pain Management:    Induction:   PONV Risk Score and Plan: Propofol  infusion  Airway Management Planned:   Additional Equipment:   Intra-op Plan:   Post-operative Plan:   Informed Consent: I have reviewed the patients History and Physical, chart, labs and discussed the procedure including the risks, benefits and alternatives for the proposed anesthesia with the patient or authorized representative who has indicated his/her understanding and acceptance.     Dental Advisory Given  Plan Discussed with: CRNA  Anesthesia Plan Comments:        Anesthesia Quick Evaluation

## 2024-01-04 NOTE — Transfer of Care (Signed)
 Immediate Anesthesia Transfer of Care Note  Patient: Alyssa Carey  Procedure(s) Performed: EGD (ESOPHAGOGASTRODUODENOSCOPY)  Patient Location: Short Stay  Anesthesia Type:General  Level of Consciousness: awake and patient cooperative  Airway & Oxygen Therapy: Patient Spontanous Breathing  Post-op Assessment: Report given to RN and Post -op Vital signs reviewed and stable  Post vital signs: Reviewed and stable  Last Vitals:  Vitals Value Taken Time  BP 122/82 01/04/24 12:12  Temp 36.6 C 01/04/24 12:12  Pulse 97 01/04/24 12:12  Resp 24 01/04/24 12:12  SpO2 99 % 01/04/24 12:12    Last Pain:  Vitals:   01/04/24 1212  TempSrc: Oral  PainSc: 0-No pain      Patients Stated Pain Goal: 5 (01/04/24 1041)  Complications: No notable events documented.

## 2024-01-04 NOTE — Interval H&P Note (Signed)
 History and Physical Interval Note:  01/04/2024 11:47 AM  Alyssa Carey  has presented today for surgery, with the diagnosis of PEPTIC ULCER DISEASE.  The various methods of treatment have been discussed with the patient and family. After consideration of risks, benefits and other options for treatment, the patient has consented to  Procedure(s) with comments: EGD (ESOPHAGOGASTRODUODENOSCOPY) (N/A) - 10:45AM;ASA 3 as a surgical intervention.  The patient's history has been reviewed, patient examined, no change in status, stable for surgery.  I have reviewed the patient's chart and labs.  Questions were answered to the patient's satisfaction.     Forbes Ida Ariel Wingrove

## 2024-01-04 NOTE — Anesthesia Procedure Notes (Signed)
 Date/Time: 01/04/2024 11:54 AM  Performed by: Verline Glow, CRNAPre-anesthesia Checklist: Patient identified, Emergency Drugs available, Suction available, Timeout performed and Patient being monitored Patient Re-evaluated:Patient Re-evaluated prior to induction Oxygen Delivery Method: Non-rebreather mask

## 2024-01-05 NOTE — Anesthesia Postprocedure Evaluation (Signed)
 Anesthesia Post Note  Patient: Alyssa Carey  Procedure(s) Performed: EGD (ESOPHAGOGASTRODUODENOSCOPY)  Patient location during evaluation: Phase II Anesthesia Type: General Level of consciousness: awake Pain management: pain level controlled Vital Signs Assessment: post-procedure vital signs reviewed and stable Respiratory status: spontaneous breathing and respiratory function stable Cardiovascular status: blood pressure returned to baseline and stable Postop Assessment: no headache and no apparent nausea or vomiting Anesthetic complications: no Comments: Late entry   No notable events documented.   Last Vitals:  Vitals:   01/04/24 1041 01/04/24 1212  BP: (!) 146/90 122/82  Pulse: 83 97  Resp: (!) 22 (!) 24  Temp: 36.8 C 36.6 C  SpO2: 100% 99%    Last Pain:  Vitals:   01/04/24 1212  TempSrc: Oral  PainSc: 0-No pain                 Yvonna JINNY Bosworth

## 2024-01-07 ENCOUNTER — Encounter (HOSPITAL_COMMUNITY): Payer: Self-pay | Admitting: Gastroenterology

## 2024-01-08 ENCOUNTER — Ambulatory Visit (INDEPENDENT_AMBULATORY_CARE_PROVIDER_SITE_OTHER): Payer: Self-pay | Admitting: Gastroenterology

## 2024-01-08 LAB — SURGICAL PATHOLOGY

## 2024-01-29 ENCOUNTER — Encounter (HOSPITAL_BASED_OUTPATIENT_CLINIC_OR_DEPARTMENT_OTHER): Attending: General Surgery | Admitting: General Surgery

## 2024-01-29 DIAGNOSIS — K709 Alcoholic liver disease, unspecified: Secondary | ICD-10-CM | POA: Diagnosis not present

## 2024-01-29 DIAGNOSIS — L98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: Secondary | ICD-10-CM | POA: Diagnosis present

## 2024-02-12 ENCOUNTER — Encounter (HOSPITAL_BASED_OUTPATIENT_CLINIC_OR_DEPARTMENT_OTHER): Admitting: General Surgery

## 2024-02-12 DIAGNOSIS — L98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: Secondary | ICD-10-CM | POA: Diagnosis not present

## 2024-02-18 ENCOUNTER — Telehealth: Payer: Self-pay | Admitting: Internal Medicine

## 2024-02-18 ENCOUNTER — Encounter: Payer: Self-pay | Admitting: Internal Medicine

## 2024-02-18 ENCOUNTER — Ambulatory Visit: Attending: Internal Medicine | Admitting: Internal Medicine

## 2024-02-18 ENCOUNTER — Ambulatory Visit: Attending: Internal Medicine

## 2024-02-18 ENCOUNTER — Other Ambulatory Visit: Payer: Self-pay | Admitting: Internal Medicine

## 2024-02-18 VITALS — BP 100/70 | HR 98 | Ht <= 58 in | Wt 159.6 lb

## 2024-02-18 DIAGNOSIS — I471 Supraventricular tachycardia, unspecified: Secondary | ICD-10-CM | POA: Diagnosis not present

## 2024-02-18 DIAGNOSIS — I480 Paroxysmal atrial fibrillation: Secondary | ICD-10-CM

## 2024-02-18 DIAGNOSIS — I509 Heart failure, unspecified: Secondary | ICD-10-CM | POA: Insufficient documentation

## 2024-02-18 DIAGNOSIS — Z7689 Persons encountering health services in other specified circumstances: Secondary | ICD-10-CM | POA: Insufficient documentation

## 2024-02-18 NOTE — Telephone Encounter (Signed)
 Checking percert on the following   2 week XT Dx: Afib

## 2024-02-18 NOTE — Progress Notes (Signed)
 Cardiology Office Note  Date: 02/18/2024   ID: Alyssa, Carey Apr 24, 1971, MRN [id number]  PCP:  [name], MD  Cardiologist:  Diannah SHAUNNA Maywood, MD Electrophysiologist:  None   History of Present Illness: Alyssa Carey is a 53 y.o. female  Referred to cardiology clinic for evaluation of A-fib and CHF.  I reviewed all the EKGs in the EMR that showed NSR and sinus tachycardia but never A-fib.  Patient reported that she was told she had A-fib in the hospital by one of the nurses.  She never had any palpitations, DOE or fatigue.  No angina either.  She recently underwent cholecystectomy.  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  Denies smoking cigarettes or alcohol use.  Past Medical History:  Diagnosis Date   Alcohol abuse    HTN (hypertension) 10/05/2020   Necrotizing fasciitis [facility])     Past Surgical History:  Procedure Laterality Date   BIOPSY  10/07/2020   Procedure: BIOPSY;  Surgeon: Shaaron Lamar HERO, MD;  [facility];  Service: Endoscopy;;   BIOPSY  03/10/2023   Procedure: BIOPSY;  Surgeon: Cinderella Deatrice FALCON, MD;  [facility];  Service: Endoscopy;;   COLONOSCOPY WITH PROPOFOL  N/A 10/07/2020   single healing rectal ulcer in distal rectum with surrounding mucosal friability and markedly abnormal proximal colon with ulceration s/p biopsies.   COLONOSCOPY WITH PROPOFOL   02/22/2021   Procedure: COLONOSCOPY WITH PROPOFOL ;  Surgeon: Cindie Carlin POUR, DO;  [facility];  Service: Endoscopy;;   COLONOSCOPY WITH PROPOFOL  N/A 03/10/2023   Procedure: COLONOSCOPY WITH PROPOFOL ;  Surgeon: Cinderella Deatrice FALCON, MD;  [facility];  Service: Endoscopy;  Laterality: N/A;   COLOSTOMY TAKEDOWN N/A 01/26/2022   Procedure: LAPAROSCOPIC COLOSTOMY TAKEDOWN;  Surgeon: Debby Hila, MD;  [facility];  Service: General;  Laterality: N/A;  laparoscopic  assisted colostomy reversal and partial colectomy   ESOPHAGOGASTRODUODENOSCOPY N/A 01/04/2024   Procedure: EGD (ESOPHAGOGASTRODUODENOSCOPY);  Surgeon: Cinderella Deatrice FALCON, MD;  [facility];  Service: Endoscopy;  Laterality: N/A;  10:45AM;ASA 3   ESOPHAGOGASTRODUODENOSCOPY (EGD) WITH PROPOFOL  N/A 10/07/2020   non-bleeding gastric ulcer . Pathology with H.pylori negative   ESOPHAGOGASTRODUODENOSCOPY (EGD) WITH PROPOFOL  N/A 12/27/2020   Procedure: ESOPHAGOGASTRODUODENOSCOPY (EGD) WITH PROPOFOL ;  Surgeon: Cindie Carlin POUR, DO;  [facility];  Service: Endoscopy;  Laterality: N/A;  1:00pm   ESOPHAGOGASTRODUODENOSCOPY (EGD) WITH PROPOFOL  N/A 01/17/2023   Procedure: ESOPHAGOGASTRODUODENOSCOPY (EGD) WITH PROPOFOL ;  Surgeon: Shaaron Lamar HERO, MD;  [facility];  Service: Endoscopy;  Laterality: N/A;   ESOPHAGOGASTRODUODENOSCOPY (EGD) WITH PROPOFOL  N/A 03/10/2023   Procedure: ESOPHAGOGASTRODUODENOSCOPY (EGD) WITH PROPOFOL ;  Surgeon: Cinderella Deatrice FALCON, MD;  [facility];  Service: Endoscopy;  Laterality: N/A;   FLEXIBLE SIGMOIDOSCOPY N/A 01/11/2020   Procedure: FLEXIBLE SIGMOIDOSCOPY;  Surgeon: Rollin Dover, MD;  [facility];  Service: Gastroenterology;  Laterality: N/A;   INCISION AND DRAINAGE PERIRECTAL ABSCESS N/A 12/25/2019   Procedure: IRRIGATION AND DEBRIDEMENT BUTTOCKS, LAP LOOP COLOSTOMY;  Surgeon: Tanda Locus, MD;  [facility];  Service: General;  Laterality: N/A;   IRRIGATION AND DEBRIDEMENT ABSCESS N/A 12/23/2019   Procedure: EXCISION AND DEBRIDEMENT LEFT BUTTOCK AND PERINEUM;  Surgeon: Signe Mitzie LABOR, MD;  [facility];  Service: General;  Laterality: N/A;   LAPAROSCOPIC LOOP COLOSTOMY N/A 12/25/2019   Procedure: LAPAROSCOPIC LOOP COLOSTOMY;  Surgeon:  Tanda Locus, MD;  Location: [facility];  Service: General;  Laterality: N/A;   POLYPECTOMY  03/10/2023   Procedure: POLYPECTOMY;  Surgeon: Cinderella Deatrice FALCON, MD;  [facility];   Service: Endoscopy;;   TONSILLECTOMY      Current Outpatient Medications  Medication Sig Dispense Refill   ascorbic acid  (VITAMIN C ) 500 MG tablet Take 500 mg by mouth daily as needed.     cholestyramine  (QUESTRAN ) 4 g packet Take 1 packet (4 g total) by mouth daily as needed (diarrhea). 30 each 3   cyclobenzaprine (FLEXERIL) 10 MG tablet Take 10 mg by mouth every 6 (six) hours as needed.     ferrous sulfate  325 (65 FE) MG EC tablet Take 1 tablet (325 mg total) by mouth daily with breakfast. 30 tablet 3   fluticasone  (FLONASE ) 50 MCG/ACT nasal spray Place 2 sprays into both nostrils as needed for allergies or rhinitis.     folic acid  (FOLVITE ) 1 MG tablet Take 1 tablet (1 mg total) by mouth daily. 30 tablet 5   gabapentin  (NEURONTIN ) 100 MG capsule Take 1 capsule (100 mg total) by mouth at bedtime as needed (pain). 30 capsule 1   hydrocortisone  (ANUSOL -HC) 2.5 % rectal cream Place 1 Application rectally as needed for hemorrhoids or anal itching. 28 g 2   hydrOXYzine  (ATARAX ) 25 MG tablet Take 1 tablet (25 mg total) by mouth as needed for itching. 30 tablet 3   loperamide  (IMODIUM ) 2 MG capsule Take 2 mg by mouth 4 (four) times daily as needed.     loratadine  (CLARITIN ) 10 MG tablet Take 1 tablet (10 mg total) by mouth daily. 30 tablet 1   montelukast  (SINGULAIR ) 10 MG tablet Take 1 tablet (10 mg total) by mouth at bedtime. 30 tablet 3   Nystatin  (GERHARDT'S BUTT CREAM) CREA Apply 1 Application topically 4 (four) times daily as needed for irritation.     pantoprazole  (PROTONIX ) 40 MG tablet Take 1 tablet (40 mg total) by mouth daily. 60 tablet 3   traZODone  (DESYREL ) 50 MG tablet Take 1 tablet (50 mg total) by mouth at bedtime as needed for sleep (insomnia). 30 tablet 2   No current facility-administered medications for this visit.   Allergies:  Pantoprazole , Penicillins, and Oxycodone  hcl   Social History: The patient  reports that she has never smoked. She has never been exposed to tobacco  smoke. She has never used smokeless tobacco. She reports that she does not currently use alcohol. She reports that she does not currently use drugs.   Family History: The patient's family history includes Arrhythmia in her mother; Colon polyps in her mother.   ROS:  Please see the history of present illness. Otherwise, complete review of systems is positive for none  All other systems are reviewed and negative.   Physical Exam: VS:  BP 100/70   Pulse 98   Ht 4' 9 (1.448 m)   Wt 159 lb 9.6 oz (72.4 kg)   SpO2 96%   BMI 34.54 kg/m , BMI Body mass index is 34.54 kg/m.  Wt Readings from Last 3 Encounters:  02/18/24 159 lb 9.6 oz (72.4 kg)  01/04/24 161 lb (73 kg)  01/01/24 162 lb (73.5 kg)    General: Patient appears comfortable at rest. HEENT: Conjunctiva and lids normal, oropharynx clear with moist mucosa. Neck: Supple, no elevated JVP or carotid bruits, no thyromegaly. Lungs: Clear to auscultation, nonlabored breathing at rest. Cardiac: Regular rate and rhythm, no S3 or significant systolic  murmur, no pericardial rub. Abdomen: Soft, nontender, no hepatomegaly, bowel sounds present, no guarding or rebound. Extremities: No pitting edema, distal pulses 2+. Skin: Warm and dry. Musculoskeletal: No kyphosis. Neuropsychiatric: Alert and oriented x3, affect grossly appropriate.  Recent Labwork: 05/30/2023: Magnesium  1.5; TSH 4.015 12/07/2023: ALT 11; AST 16; BUN 14; Creatinine, Ser 0.59; Hemoglobin 10.7; Platelets 325; Potassium 3.6; Sodium 134     Component Value Date/Time   TRIG 462 (H) 08/22/2021 0501    Assessment and Plan:  Chart documentation of A-fib - I reviewed all the EKGs in the EMR that showed NSR, sinus tachycardia.  No A-fib. - Patient reported that she was told by one of the nurses when she was hospitalized that she had A-fib.  Denied having any symptoms of palpitations, SOB or fatigue. - Obtain 2-week event monitor to rule out paroxysmal A-fib. - Return to  follow-up based on event monitor results. - If event monitor is negative for A-fib, instructed patient to purchase smart watch with EKG lead functionality for A-fib surveillance.  CHF - I reviewed the echocardiogram performed in 2024 at [facility] that showed normal LVEF and no valvular heart disease.  She denies having symptoms of DOE or leg swelling.  I do not think she has criteria for the diagnosis of diastolic failure at this time.  20 minutes spent in reviewing the records, reports, all the EKGs in the EMR.  20 minutes spent with the patient and documentation.   Medication Adjustments/Labs and Tests Ordered: Current medicines are reviewed at length with the patient today.  Concerns regarding medicines are outlined above.    Disposition:  Follow up prn  Signed Phyllip Claw Arleta Maywood, MD, 02/18/2024 10:46 AM    [facility] [address] [place], [place], [place] [address]

## 2024-02-18 NOTE — Patient Instructions (Addendum)
 Medication Instructions:  Your physician recommends that you continue on your current medications as directed. Please refer to the Current Medication list given to you today.   Labwork: None  Testing/Procedures: Your physician has recommended that you wear a Zio monitor.   This monitor is a medical device that records the heart's electrical activity. Doctors most often use these monitors to diagnose arrhythmias. Arrhythmias are problems with the speed or rhythm of the heartbeat. The monitor is a small device applied to your chest. You can wear one while you do your normal daily activities. While wearing this monitor if you have any symptoms to push the button and record what you felt. Once you have worn this monitor for the period of time provider prescribed (for 14 days), you will return the monitor device in the postage paid box. Once it is returned they will download the data collected and provide us  with a report which the provider will then review and we will call you with those results. Important tips:  Avoid showering during the first 24 hours of wearing the monitor. Avoid excessive sweating to help maximize wear time. Do not submerge the device, no hot tubs, and no swimming pools. Keep any lotions or oils away from the patch. After 24 hours you may shower with the patch on. Take brief showers with your back facing the shower head.  Do not remove patch once it has been placed because that will interrupt data and decrease adhesive wear time. Push the button when you have any symptoms and write down what you were feeling. Once you have completed wearing your monitor, remove and place into box which has postage paid and place in your outgoing mailbox.  If for some reason you have misplaced your box then call our office and we can provide another box and/or mail it off for you.   Follow-Up: Your physician recommends that you schedule a follow-up appointment in: Follow up as needed  Any  Other Special Instructions Will Be Listed Below (If Applicable). Thank you for choosing [facility]!     If you need a refill on your cardiac medications before your next appointment, please call your pharmacy.

## 2024-02-26 ENCOUNTER — Encounter (HOSPITAL_BASED_OUTPATIENT_CLINIC_OR_DEPARTMENT_OTHER): Attending: General Surgery | Admitting: General Surgery

## 2024-02-26 DIAGNOSIS — L98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: Secondary | ICD-10-CM | POA: Diagnosis present

## 2024-02-26 DIAGNOSIS — K709 Alcoholic liver disease, unspecified: Secondary | ICD-10-CM | POA: Insufficient documentation

## 2024-02-28 ENCOUNTER — Encounter (INDEPENDENT_AMBULATORY_CARE_PROVIDER_SITE_OTHER): Payer: Self-pay | Admitting: Gastroenterology

## 2024-03-11 ENCOUNTER — Encounter (HOSPITAL_BASED_OUTPATIENT_CLINIC_OR_DEPARTMENT_OTHER): Admitting: General Surgery

## 2024-03-11 DIAGNOSIS — L98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: Secondary | ICD-10-CM | POA: Diagnosis not present

## 2024-03-25 ENCOUNTER — Ambulatory Visit (HOSPITAL_BASED_OUTPATIENT_CLINIC_OR_DEPARTMENT_OTHER): Admitting: General Surgery

## 2024-03-26 DIAGNOSIS — I480 Paroxysmal atrial fibrillation: Secondary | ICD-10-CM

## 2024-03-28 ENCOUNTER — Ambulatory Visit: Payer: Self-pay | Admitting: Internal Medicine

## 2024-03-31 NOTE — Telephone Encounter (Signed)
 The patient has been notified of the result and verbalized understanding.  All questions (if any) were answered. Alyssa Carey, CMA 03/31/2024 4:06 PM

## 2024-03-31 NOTE — Telephone Encounter (Signed)
-----   Message from Vishnu P Mallipeddi sent at 03/28/2024 11:47 AM EDT ----- Normal event monitor except for average HR 105 bpm. Can follow up with PCP for symptomatic management of sinus tachycardia with BB/CCB after underlying etiologies are addressed. ----- Message ----- From: Stacia Diannah SQUIBB, MD Sent: 03/26/2024  11:08 AM EDT To: Vishnu P Mallipeddi, MD

## 2024-04-11 ENCOUNTER — Encounter (HOSPITAL_BASED_OUTPATIENT_CLINIC_OR_DEPARTMENT_OTHER): Attending: General Surgery | Admitting: General Surgery

## 2024-04-11 DIAGNOSIS — L98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: Secondary | ICD-10-CM | POA: Diagnosis present

## 2024-04-11 DIAGNOSIS — K709 Alcoholic liver disease, unspecified: Secondary | ICD-10-CM | POA: Diagnosis not present

## 2024-04-25 ENCOUNTER — Encounter (HOSPITAL_BASED_OUTPATIENT_CLINIC_OR_DEPARTMENT_OTHER): Attending: General Surgery | Admitting: General Surgery

## 2024-04-25 DIAGNOSIS — L98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: Secondary | ICD-10-CM | POA: Diagnosis present

## 2024-04-25 DIAGNOSIS — K709 Alcoholic liver disease, unspecified: Secondary | ICD-10-CM | POA: Diagnosis not present

## 2024-04-30 ENCOUNTER — Telehealth: Payer: Self-pay

## 2024-04-30 ENCOUNTER — Ambulatory Visit: Payer: Self-pay | Admitting: General Surgery

## 2024-04-30 NOTE — Telephone Encounter (Signed)
  Patient Consent for Virtual Visit        Alyssa Carey has provided verbal consent on 04/30/2024 for a virtual visit (video or telephone).   CONSENT FOR VIRTUAL VISIT FOR:  Alyssa Carey  By participating in this virtual visit I agree to the following:  I hereby voluntarily request, consent and authorize [facility] and its employed or contracted physicians, physician assistants, nurse practitioners or other licensed health care professionals (the Practitioner), to provide me with telemedicine health care services (the "Services) as deemed necessary by the treating Practitioner. I acknowledge and consent to receive the Services by the Practitioner via telemedicine. I understand that the telemedicine visit will involve communicating with the Practitioner through live audiovisual communication technology and the disclosure of certain medical information by electronic transmission. I acknowledge that I have been given the opportunity to request an in-person assessment or other available alternative prior to the telemedicine visit and am voluntarily participating in the telemedicine visit.  I understand that I have the right to withhold or withdraw my consent to the use of telemedicine in the course of my care at any time, without affecting my right to future care or treatment, and that the Practitioner or I may terminate the telemedicine visit at any time. I understand that I have the right to inspect all information obtained and/or recorded in the course of the telemedicine visit and may receive copies of available information for a reasonable fee.  I understand that some of the potential risks of receiving the Services via telemedicine include:  Delay or interruption in medical evaluation due to technological equipment failure or disruption; Information transmitted may not be sufficient (e.g. poor resolution of images) to allow for appropriate medical decision making by the  Practitioner; and/or  In rare instances, security protocols could fail, causing a breach of personal health information.  Furthermore, I acknowledge that it is my responsibility to provide information about my medical history, conditions and care that is complete and accurate to the best of my ability. I acknowledge that Practitioner's advice, recommendations, and/or decision may be based on factors not within their control, such as incomplete or inaccurate data provided by me or distortions of diagnostic images or specimens that may result from electronic transmissions. I understand that the practice of medicine is not an exact science and that Practitioner makes no warranties or guarantees regarding treatment outcomes. I acknowledge that a copy of this consent can be made available to me via my patient portal [facility] MyChart), or I can request a printed copy by calling the office of [facility].    I understand that my insurance will be billed for this visit.   I have read or had this consent read to me. I understand the contents of this consent, which adequately explains the benefits and risks of the Services being provided via telemedicine.  I have been provided ample opportunity to ask questions regarding this consent and the Services and have had my questions answered to my satisfaction. I give my informed consent for the services to be provided through the use of telemedicine in my medical care

## 2024-04-30 NOTE — Telephone Encounter (Signed)
   Name: Alyssa Carey  DOB: 1970-08-06  MRN: [id number]  Primary Cardiologist: Diannah SHAUNNA Maywood, MD   Preoperative team, please contact this patient and set up a phone call appointment for further preoperative risk assessment. Please obtain consent and complete medication review. Thank you for your help.  I confirm that guidance regarding antiplatelet and oral anticoagulation therapy has been completed and, if necessary, noted below.  Patient is not on anticoagulation or antiplatelet per review of current medical record in Epic.    I also confirmed the patient resides in the state of [place] . As per [facility] Medical Board telemedicine laws, the patient must reside in the state in which the provider is licensed.   Alyssa Satterfield, NP 04/30/2024, 10:40 AM [facility]

## 2024-04-30 NOTE — Telephone Encounter (Signed)
 Patient returned Pre-op call.

## 2024-04-30 NOTE — Telephone Encounter (Signed)
 Preop tele appt now scheduled, med rec and consent done.

## 2024-04-30 NOTE — Telephone Encounter (Signed)
   Pre-operative Risk Assessment    Patient Name: Alyssa Carey  DOB: 10-13-1970 MRN: [id number]   Date of last office visit: 02/18/24 DIANNAH MAYWOOD, MD Date of next office visit: NNE   Request for Surgical Clearance    Procedure:  HERNIA SURGERY  Date of Surgery:  Clearance TBD                                Surgeon:  LYNDA LEOS, MD Surgeon's Group or Practice Name:  [facility] Phone number:  [phone] Fax number:  [phone]   ATTN: ROSALINE SPRANG, CMA   Type of Clearance Requested:   - Medical    Type of Anesthesia:  General    Additional requests/questions:    Signed, Lucie DELENA Ku   04/30/2024, 10:35 AM

## 2024-04-30 NOTE — Telephone Encounter (Signed)
 1st attempt to reach pt regarding surgical clearance and the need for an TELE appointment.  Left pt a detailed message to call back and get that scheduled.

## 2024-05-09 ENCOUNTER — Ambulatory Visit (HOSPITAL_BASED_OUTPATIENT_CLINIC_OR_DEPARTMENT_OTHER): Admitting: General Surgery

## 2024-05-09 ENCOUNTER — Ambulatory Visit: Attending: Cardiovascular Disease | Admitting: Nurse Practitioner

## 2024-05-09 DIAGNOSIS — Z01810 Encounter for preprocedural cardiovascular examination: Secondary | ICD-10-CM | POA: Diagnosis present

## 2024-05-09 NOTE — Progress Notes (Signed)
 Virtual Visit via Telephone Note   Because of Alyssa Carey co-morbid illnesses, she is at least at moderate risk for complications without adequate follow up.  This format is felt to be most appropriate for this patient at this time.  Due to technical limitations with video connection Web designer), today's appointment will be conducted as an audio only telehealth visit, and Alyssa Carey verbally agreed to proceed in this manner.   All issues noted in this document were discussed and addressed.  No physical exam could be performed with this format.  Evaluation Performed:  Preoperative cardiovascular risk assessment _____________   Date:  05/09/2024   Patient ID:  Alyssa Carey, DOB 08/03/1970, MRN [id number] Patient Location:  Home Provider location:   Office  Primary Care Provider:  [name], MD Primary Cardiologist:  Vishnu P Mallipeddi, MD  Chief Complaint / Patient Profile   53 y.o. y/o female with a h/o paroxysmal atrial fibrillation not on anticoagulation (isolated episode), hypertension, and alcohol use disorder who is pending Hernia surgery with Dr. Lynda Leos of [facility] and presents today for telephonic preoperative cardiovascular risk assessment.  History of Present Illness    Alyssa Carey is a 53 y.o. female who presents via audio/video conferencing for a telehealth visit today.  Pt was last seen in cardiology clinic on 02/18/2024 by Dr. Mallipeddi  At that time Alyssa Carey was doing well. The patient is now pending procedure as outlined above. Since her last visit, she has done well from a cardiac standpoint.   She denies chest pain, palpitations, dyspnea, pnd, orthopnea, n, v, dizziness, syncope, edema, weight gain, or early satiety. All other systems reviewed and are otherwise negative except as noted above.   Past Medical History    Past Medical History:  Diagnosis Date   Alcohol abuse    HTN (hypertension) 10/05/2020    Necrotizing fasciitis [facility])    Past Surgical History:  Procedure Laterality Date   BIOPSY  10/07/2020   Procedure: BIOPSY;  Surgeon: Shaaron Lamar HERO, MD;  [facility];  Service: Endoscopy;;   BIOPSY  03/10/2023   Procedure: BIOPSY;  Surgeon: Cinderella Deatrice FALCON, MD;  [facility];  Service: Endoscopy;;   COLONOSCOPY WITH PROPOFOL  N/A 10/07/2020   single healing rectal ulcer in distal rectum with surrounding mucosal friability and markedly abnormal proximal colon with ulceration s/p biopsies.   COLONOSCOPY WITH PROPOFOL   02/22/2021   Procedure: COLONOSCOPY WITH PROPOFOL ;  Surgeon: Cindie Carlin POUR, DO;  [facility];  Service: Endoscopy;;   COLONOSCOPY WITH PROPOFOL  N/A 03/10/2023   Procedure: COLONOSCOPY WITH PROPOFOL ;  Surgeon: Cinderella Deatrice FALCON, MD;  [facility];  Service: Endoscopy;  Laterality: N/A;   COLOSTOMY TAKEDOWN N/A 01/26/2022   Procedure: LAPAROSCOPIC COLOSTOMY TAKEDOWN;  Surgeon: Debby Hila, MD;  Location: [facility];  Service: General;  Laterality: N/A;  laparoscopic assisted colostomy reversal and partial colectomy   ESOPHAGOGASTRODUODENOSCOPY N/A 01/04/2024   Procedure: EGD (ESOPHAGOGASTRODUODENOSCOPY);  Surgeon: Cinderella Deatrice FALCON, MD;  [facility];  Service: Endoscopy;  Laterality: N/A;  10:45AM;ASA 3   ESOPHAGOGASTRODUODENOSCOPY (EGD) WITH PROPOFOL  N/A 10/07/2020   non-bleeding gastric ulcer . Pathology with H.pylori negative   ESOPHAGOGASTRODUODENOSCOPY (EGD) WITH PROPOFOL  N/A 12/27/2020   Procedure: ESOPHAGOGASTRODUODENOSCOPY (EGD) WITH PROPOFOL ;  Surgeon: Cindie Carlin POUR, DO;  [facility];  Service: Endoscopy;  Laterality: N/A;  1:00pm   ESOPHAGOGASTRODUODENOSCOPY (EGD) WITH PROPOFOL  N/A 01/17/2023   Procedure: ESOPHAGOGASTRODUODENOSCOPY (  EGD) WITH PROPOFOL ;  Surgeon: Shaaron Lamar HERO, MD;  [facility];  Service: Endoscopy;  Laterality: N/A;   ESOPHAGOGASTRODUODENOSCOPY (EGD) WITH PROPOFOL  N/A  03/10/2023   Procedure: ESOPHAGOGASTRODUODENOSCOPY (EGD) WITH PROPOFOL ;  Surgeon: Cinderella Deatrice FALCON, MD;  [facility];  Service: Endoscopy;  Laterality: N/A;   FLEXIBLE SIGMOIDOSCOPY N/A 01/11/2020   Procedure: FLEXIBLE SIGMOIDOSCOPY;  Surgeon: Rollin Dover, MD;  [facility];  Service: Gastroenterology;  Laterality: N/A;   INCISION AND DRAINAGE PERIRECTAL ABSCESS N/A 12/25/2019   Procedure: IRRIGATION AND DEBRIDEMENT BUTTOCKS, LAP LOOP COLOSTOMY;  Surgeon: Tanda Locus, MD;  [facility];  Service: General;  Laterality: N/A;   IRRIGATION AND DEBRIDEMENT ABSCESS N/A 12/23/2019   Procedure: EXCISION AND DEBRIDEMENT LEFT BUTTOCK AND PERINEUM;  Surgeon: Signe Mitzie LABOR, MD;  [facility];  Service: General;  Laterality: N/A;   LAPAROSCOPIC LOOP COLOSTOMY N/A 12/25/2019   Procedure: LAPAROSCOPIC LOOP COLOSTOMY;  Surgeon: Tanda Locus, MD;  Location: [facility];  Service: General;  Laterality: N/A;   POLYPECTOMY  03/10/2023   Procedure: POLYPECTOMY;  Surgeon: Cinderella Deatrice FALCON, MD;  [facility];  Service: Endoscopy;;   TONSILLECTOMY      Allergies  Allergies  Allergen Reactions   Pantoprazole      Stomach pain   Penicillins Hives   Oxycodone  Hcl Rash    Home Medications    Prior to Admission medications   Medication Sig Start Date End Date Taking? Authorizing Provider  ascorbic acid  (VITAMIN C ) 500 MG tablet Take 500 mg by mouth daily as needed.    [provider]  cholestyramine  (QUESTRAN ) 4 g packet Take 1 packet (4 g total) by mouth daily as needed (diarrhea). 06/02/23   Pearlean Manus, MD  cyclobenzaprine (FLEXERIL) 10 MG tablet Take 10 mg by mouth every 6 (six) hours as needed. 10/26/23   [provider]  ferrous sulfate  325 (65 FE) MG EC tablet Take 1 tablet (325 mg total) by mouth daily with breakfast. 06/02/23   Pearlean Manus, MD  fluticasone  (FLONASE ) 50 MCG/ACT nasal spray Place 2 sprays into both nostrils as needed for  allergies or rhinitis. 09/15/22   [provider]  folic acid  (FOLVITE ) 1 MG tablet Take 1 tablet (1 mg total) by mouth daily. 06/02/23   Pearlean Manus, MD  gabapentin  (NEURONTIN ) 100 MG capsule Take 1 capsule (100 mg total) by mouth at bedtime as needed (pain). 06/02/23   Pearlean Manus, MD  hydrocortisone  (ANUSOL -HC) 2.5 % rectal cream Place 1 Application rectally as needed for hemorrhoids or anal itching. 06/02/23   Pearlean Manus, MD  hydrOXYzine  (ATARAX ) 25 MG tablet Take 1 tablet (25 mg total) by mouth as needed for itching. 06/02/23   Pearlean Manus, MD  loperamide  (IMODIUM ) 2 MG capsule Take 2 mg by mouth 4 (four) times daily as needed. 10/26/23   [provider]  loratadine  (CLARITIN ) 10 MG tablet Take 1 tablet (10 mg total) by mouth daily. 11/14/22   Ricky Fines, MD  montelukast  (SINGULAIR ) 10 MG tablet Take 1 tablet (10 mg total) by mouth at bedtime. 06/02/23   Pearlean Manus, MD  Nystatin  (GERHARDT'S BUTT CREAM) CREA Apply 1 Application topically 4 (four) times daily as needed for irritation. 11/13/22   Ricky Fines, MD  pantoprazole  (PROTONIX ) 40 MG tablet Take 1 tablet (40 mg total) by mouth daily. 01/04/24 04/30/24  Ahmed, Muhammad F, MD  traZODone  (DESYREL ) 50 MG tablet Take 1 tablet (50 mg total) by mouth at bedtime as needed for  sleep (insomnia). 06/02/23   Pearlean Manus, MD    Physical Exam    Vital Signs:  Alyssa Carey does not have vital signs available for review today.  Given telephonic nature of communication, physical exam is limited. AAOx3. NAD. Normal affect.  Speech and respirations are unlabored.  Accessory Clinical Findings    None  Assessment & Plan    1.  Preoperative Cardiovascular Risk Assessment:  According to the Revised Cardiac Risk Index (RCRI), her Perioperative Risk of Major Cardiac Event is (%): 0.4. Her Functional Capacity in METs is: 7.99 according to the Duke Activity Status Index (DASI). Therefore, based on  ACC/AHA guidelines, patient would be at acceptable risk for the planned procedure without further cardiovascular testing. The patient was advised that if she develops new symptoms prior to surgery to contact our office to arrange for a follow-up visit, and she verbalized understanding.   A copy of this note will be routed to requesting surgeon.  Time:   Today, I have spent 5 minutes with the patient with telehealth technology discussing medical history, symptoms, and management plan.     Damien JAYSON Braver, NP  05/09/2024, 9:06 AM

## 2024-05-13 NOTE — H&P (Addendum)
 History and Physical [facility]   05/13/24    Patient name: Alyssa Carey DOB 12/20/1970 MRN#: [id number] PCP: Lari Elspeth Holmes, MD Time: 10:34 PM Primary Care Provider:  Lari Elspeth Holmes, MD Inpatient primary attending provider: Thersia Roger, D.O. / Adina Para, D.O.  _________________________________________________________________________  Admission HPI  Patient admitted on: 05/13/2024  6:46 PM  Patient admitted by: Thersia Roger, D.O.  CHIEF COMPLAINT:  N/V  Day of admission HPI:  Alyssa Carey  is a 53 y.o. female with a PMH significant for HLD, EtOH hepatitis, HTN, aflutter,  who presented with six days history of N/V/D.  She did have some coworkers who have similar symptoms.    Otherwise there has been no change in status. Patient has been taking medication as prescribed and there has been no recent change in medication or diet. There has been no recent illness/hospitalizations, travel or sick contacts.  Patient denies fevers/chills, weakness, dizziness, chest pain, shortness of breath, abdominal pain, dysuria/frequency, changes in mental status.  In the ED the patient received diphenhydramine , famotidine , ketorolac , LR, ondansetron , KCL, prochlorperazine .   Medical admission was requested for further workup and management of Significant electrolyte abnormalities including hypokalemia, hypomag, and hypoglycemia, elevated LFTs.    Patient admitted on Home O2? - No Patient on home anticoagulant? -  No Patient admitted with Chronic home foley catheter? - No Foley catheter placed or replaced by another service prior to admission? - No Central Line Status: None  Mental Status on Admission: The patient IS Alert and oriented to PERSON The patient IS  Alert And oriented to TIME The patient IS Alert and oriented to LOCATION  Problem List, Assessment & Plan    ASSESSMENT & PLAN (In order of descending  acuity)  53 y.o. female with a PMH significant for HLD, EtOH hepatitis, HTN, aflutter,  now admitted with:   Electrolyte abnormalities likely 2/2 gastroenteritis Patient is symptomatic with: N/V/D PE: Well appearing, MMM Labs: Mg 1.3, K 2.7, Glycose 12 Imaging reveals: CT abdomen negative for acute processes - Admit obs tele - Replace lytes as needed - Check BMP at 0200 - Check stool culture - Check EKG - Clear liquid diet and advance as tolerated - Aspiration precautions   Hypoglycemia - Continue dextrose  10% infusion - Hourly accuchecks  Abnormal EKG likely related to above.  No chest pain - EKG: NSR at 100 bpm with leftward axis, prolonged QT and TWI in I, II, V3-6 - Trend trops - Cardio consult in AM Elevated LFTs - Check hepatitis panel - Consider RUQ US  if needed given h/o EtOH hepatitis.    Admission status: Obs tele IV Fluids: LR Diet/Nutrition: Clear liquid Consults: PT DVT Px: SCDs and early ambulation Code Status: Full Code Disposition Plan: To home in <24 hours   All the records are reviewed, management plans discussed and questions answered. Patient and / or family expresses understanding and agrees with plan.      ___________________________________________    ADDITIONAL NON-ACUTE FINDINGS, OBSERVATIONS, FAMILY DISCUSSIONS, ETC. (When present):  Gen: Sitting up in bed, NAD HEENT: [place]/AT, EOMI, no scleral icterus, mucous membranes moist Lungs: Respirations unlabored, no retractions or use of accessory muscles.  No audible cough or wheezing.  Per provider, clear to ausculation bilaterally.  Heart: Per provider, regular rate and rhythm Abdomen: Per provider, no tenderness noted, soft, non-distended, normal bowel sounds. Extremities: No edema, no discoloration Skin: No jaundice or petechiae.   Neurologic: Alert and oriented x3, speech clear,  no facial droop, no tremor, moves all extremities  _______________________________  Temp:  [35.9 C (96.6 F)]  35.9 C (96.6 F) Pulse:  [101] 101 Resp:  [16] 16 BP: (108)/(85) 108/85 SpO2:  [100 %] 100 % Body mass index is 32.03 kg/m. Intake/Output last 3 shifts: No intake/output data recorded.  Consults Requested  None      An advanced care planning discussion is  had with patient and/or patient's decisions maker (documented separately).  CODE STATUS :  FULL CODE   __________________________________________________________  Allergies  Allergen Reactions  . Oxycodone  Hcl Rash and Other (See Comments)  . Penicillins Hives    Did it involve swelling of the face/tongue/throat, SOB, or low BP? N  Did it involve sudden or severe rash/hives, skin peeling, or any reaction on the inside of your mouth or nose? Y  Did you need to seek medical attention at a hospital or doctor's office? N  When did it last happen?  Childhood      If all above answers are "NO", may proceed with cephalosporin use.  Did it involve swelling of the face/tongue/throat, SOB, or low BP? N  Did it involve sudden or severe rash/hives, skin peeling, or any reaction on the inside of your mouth or nose? Y  Did you need to seek medical attention at a hospital or doctor's office? N  When did it last happen?  Childhood      If all above answers are NO, may proceed with cephalosporin use.  Did it involve swelling of the face/tongue/throat, SOB, or low BP? N  Did it involve sudden or severe rash/hives, skin peeling, or any reaction on the inside of your mouth or nose? Y  Did you need to seek medical attention at a hospital or doctor's office? N  When did it last happen?  Childhood      If all above answers are "NO", may proceed with cephalosporin use.  . Pantoprazole  Other (See Comments)    Stomach pain  Pt states she is allergic to the medication has a prescription for it     Past Medical History[1]  Past Surgical History[2]   Family History[3]   Current Medications[4]  REFER TO EPIC FOR FULL LIST OF  CURRENT MEDICATIONS ORDERED ON ADMISSION. THESE ORDERS APPEAR ONLY WHEN RELEASED, WHICH MAY HAPPEN AFTER ADMISSION ONCE PATIENT IS TRANSFERRED FROM THE ED.  Allergies  Allergies[5]  Imaging  CT Abdomen Pelvis Wo Contrast Result Date: 05/13/2024 Exam:  CT of the abdomen and pelvis without contrast  History:  Nausea and vomiting     Technique:  Routine CT of the abdomen and pelvis without IV contrast.    AEC (automated exposure control) and/or manual techniques such as size-specific kV and mAs are employed where appropriate to reduce radiation exposure for all CT exams.  Comparison:  08/31/2023    Abdomen CT Findings:  LOWER THORAX:  Unremarkable.    HEPATOBILIARY:  Diminished attenuation throughout the liver consistent with diffuse steatosis. Cholecystectomy. Nondilated bile duct.    PANCREAS:  Normal.    SPLEEN:  Normal.    ADRENALS:  Normal.    KIDNEYS/URETERS:  Normal.     VASCULAR:  Nondilated aorta.     BOWEL/MESENTERY:  Large left anterior abdominal wall hernia contains nondilated segments of small bowel. Separate periumbilical hernia also contains nondilated small bowel segments. No evidence for incarceration. Chronic changes from previous partial sigmoid colectomy.      PELVIS:  The bladder is unremarkable. No pelvic free fluid.  BONES:  Unremarkable.         1.    Anterior abdominal wall hernias containing nondilated segments of bowel, no evidence for obstruction or incarceration. No acute inflammatory changes.  2.    Hepatic steatosis.  3.    Additional chronic changes as above.  Signed (Electronic Signature): 05/13/2024 10:18 PM Signed By: Emeline KATHEE Milroy, MD   Lab Results   Recent Labs    05/13/24 2139  WBC 9.2  HGB 10.4*  HCT 30.8*  PLT 193   Recent Labs    05/13/24 2139  NA 137  K 2.7*  CL 95*  CO2 20.1*  BUN 11  CREATININE 0.69  GLU 18*  CALCIUM 8.5  ALBUMIN  3.3*  PROT 6.7  BILITOT 1.4*  AST 173*  ALT 47  ALKPHOS 244*  MG 1.3*  LIPASE 22   No results for  input(s): CKTOTAL, CKMB, PCTCKMB, TROPONINI, EDTPNI, BNP, INR, LABPROT, APTT, DDIMER in the last 72 hours. No results for input(s): WBCUA, NITRITE, LEUKOCYTESUR, BACTERIA, RBCUA, BLOODU, GLUCOSEU, PROTEINUA, KETONESU, KETUR in the last 72 hours. No results for input(s): OPIAU, BENZU, TRICYCLIC, PCPU, AMPHU, COCAU, CANNAU, BARBU, ETOH, ACETAMIN, SALICYLATE in the last 72 hours. No results for input(s): PREGTESTUR, PREGPOC in the last 72 hours. No results for input(s): OCCULTBLD, RAPSCRN, CDIFRPCR, CDIFFNAP1, A1C, CHOL, LDL, HDL, TRIG in the last 72 hours. No results for input(s): O2SOUR, FIO2ART, PHART, PCO2ART, PO2ART, HCO3ART, O2SATART, BEART in the last 72 hours.   EKG: NSR at 100 bpm with leftward axis, prolonged QT and TWI in I, II, V3-6  Home Medications   Prior to Admission medications  Medication Dose, Route, Frequency  hydrOXYzine  (ATARAX ) 25 MG tablet 25 mg, Oral, 2 times a day PRN  magnesium  oxide (MAG-OX) 400 mg (241.3 mg elemental magnesium ) tablet 400 mg, Oral, Daily (standard)  potassium chloride  (MICRO-K ) 10 mEq CR capsule 10 mEq, Oral, 2 times a day (standard)  promethazine  (PHENERGAN ) 25 MG tablet 25 mg, Oral, Every 8 hours PRN   [employer], DO Hospitalist, [facility] 05/13/24, 10:34 PM   I was outside of the hospital.  I spent 20 minutes on the video with the patient for a consultation. Total time was 75 minutes and over 50% of the service was counseling and/or coordination of care within the patient unit. Physical Exam performed and may have been assisted by bedside nursing staff.       [1] Past Medical History: Diagnosis Date  . A-fib (CMS-HCC)   . Hypertension   [2] Past Surgical History: Procedure Laterality Date  . CESAREAN SECTION  1993  . COLOSTOMY    . PR LAP,CHOLECYSTECTOMY Midline 09/03/2023   Procedure: LAPAROSCOPY, SURGICAL; CHOLECYSTECTOMY;   Surgeon: Rey Fonda PARAS, MD;  Location: OR [facility];  Service: Trauma  [3] Family History Problem Relation Age of Onset  . Breast cancer Mother 53  . COPD Mother   . Cancer Mother   . Hypertension Mother   . Atrial fibrillation Mother   . No Known Problems Father   . No Known Problems Son   . Breast cancer Maternal Aunt 53       2x  [4]  Current Facility-Administered Medications:  .  dextrose  10 % infusion, 125 mL/hr, Intravenous, Continuous, Collins, Rosaline Ruth, FNP .  magnesium  oxide (MAG-OX) tablet 400 mg, 400 mg, Oral, Once, Collins, Rosaline Ruth, FNP .  potassium chloride  ER tablet 40 mEq, 40 mEq, Oral, Once, Gerome Rosaline Ruth, FNP  Current Outpatient Medications:  .  hydrOXYzine  (  ATARAX ) 25 MG tablet, Take 1 tablet (25 mg total) by mouth two (2) times a day as needed., Disp: , Rfl:  .  magnesium  oxide (MAG-OX) 400 mg (241.3 mg elemental magnesium ) tablet, Take 1 tablet (400 mg total) by mouth daily., Disp: 10 tablet, Rfl: 0 .  potassium chloride  (MICRO-K ) 10 mEq CR capsule, Take 1 capsule (10 mEq total) by mouth two (2) times a day., Disp: 20 capsule, Rfl: 0 .  promethazine  (PHENERGAN ) 25 MG tablet, Take 1 tablet (25 mg total) by mouth every eight (8) hours as needed for nausea for up to 7 days., Disp: 30 tablet, Rfl: 0 [5] Allergies Allergen Reactions  . Oxycodone  Hcl Rash and Other (See Comments)  . Penicillins Hives    Did it involve swelling of the face/tongue/throat, SOB, or low BP? N  Did it involve sudden or severe rash/hives, skin peeling, or any reaction on the inside of your mouth or nose? Y  Did you need to seek medical attention at a hospital or doctor's office? N  When did it last happen?  Childhood      If all above answers are "NO", may proceed with cephalosporin use.  Did it involve swelling of the face/tongue/throat, SOB, or low BP? N  Did it involve sudden or severe rash/hives, skin peeling, or any reaction on the inside of your mouth or  nose? Y  Did you need to seek medical attention at a hospital or doctor's office? N  When did it last happen?  Childhood      If all above answers are NO, may proceed with cephalosporin use.  Did it involve swelling of the face/tongue/throat, SOB, or low BP? N  Did it involve sudden or severe rash/hives, skin peeling, or any reaction on the inside of your mouth or nose? Y  Did you need to seek medical attention at a hospital or doctor's office? N  When did it last happen?  Childhood      If all above answers are "NO", may proceed with cephalosporin use.  . Pantoprazole  Other (See Comments)    Stomach pain  Pt states she is allergic to the medication has a prescription for it

## 2024-05-14 NOTE — Progress Notes (Signed)
 PROGRESS NOTE [facility] 05/14/24    Patient name: Alyssa Carey. Mcwethy DOB August 18, 1970 MRN#: [id number] PCP: Lari Elspeth Holmes, MD Time: 8:09 AM Primary Care Provider:  Lari Elspeth Holmes, MD Inpatient primary attending provider: Joana Marice Hurst, [facility] Course: No notes on file  _______________________________________________________________   Admission HPI  Patient admitted on: 05/13/2024  6:46 PM  Patient admitted by: Thersia Roger, D.O.   CHIEF COMPLAINT:  N/V   Day of admission HPI:  Alyssa Carey  is a 53 y.o. female with a PMH significant for HLD, EtOH hepatitis, HTN, aflutter,  who presented with six days history of N/V/D.  She did have some coworkers who have similar symptoms.     Otherwise there has been no change in status. Patient has been taking medication as prescribed and there has been no recent change in medication or diet. There has been no recent illness/hospitalizations, travel or sick contacts.  Patient denies fevers/chills, weakness, dizziness, chest pain, shortness of breath, abdominal pain, dysuria/frequency, changes in mental status.  In the ED the patient received diphenhydramine , famotidine , ketorolac , LR, ondansetron , KCL, prochlorperazine .   Medical admission was requested for further workup and management of Significant electrolyte abnormalities including hypokalemia, hypomag, and hypoglycemia, elevated LFTs.      Patient admitted on Home O2? - No Patient on home anticoagulant? -  No Patient admitted with Chronic home foley catheter? - No Foley catheter placed or replaced by another service prior to admission? - No Central Line Status: None   Mental Status on Admission: The patient IS Alert and oriented to PERSON The patient IS  Alert And oriented to TIME The patient IS Alert and oriented to LOCATION   Problem List, Assessment & Plan     ASSESSMENT & PLAN (In order of descending acuity)   53  y.o. female with a PMH significant for HLD, EtOH hepatitis, HTN, aflutter,  now admitted with:     Electrolyte abnormalities likely 2/2 gastroenteritis Patient is symptomatic with: N/V/D PE: Well appearing, MMM Labs: Mg 1.3, K 2.7, Glycose 12 Imaging reveals: CT abdomen negative for acute processes - Admit obs tele - Replace lytes as needed - Check BMP at 0200 - Check stool culture - Check EKG - Clear liquid diet and advance as tolerated - Aspiration precautions    Hypoglycemia - Continue dextrose  10% infusion - Hourly accuchecks   Abnormal EKG likely related to above.  No chest pain - EKG: NSR at 100 bpm with leftward axis, prolonged QT and TWI in I, II, V3-6 - Trend trops - Cardio consult in AM Elevated LFTs - Check hepatitis panel - Consider RUQ US  if needed given h/o EtOH hepatitis.      Admission status: Obs tele IV Fluids: LR Diet/Nutrition: Clear liquid Consults: PT DVT Px: SCDs and early ambulation Code Status: Full Code Disposition Plan: To home in <24 hours      ___________________________________________    May 14, 2024 => 7:30 AM: Nurse sent message troponin elevated = 130 EKG ordered which shows ST depressions on lead I and II and V5 Urgent cardiology consult => they are working her up.  Patient scheduled for echo, serial troponins, ECG; no chest pain Also has decreased magnesium  = 1.2 => will replete Had severe hypoglycemia with readings of 12 at 10:15 PM on October 28 but currently glucose = 84; will continue fluids of D10 and every hour glucose check for now Urine drug screen this morning is positive for barbiturates (negative for  all else including amphetamine/benzodiazepine/cannabinoids/cocaine/methadone/opioid/oxycodone ). Complaining of abdominal pain => treated with GI cocktail.  Did well Received message from nurse approximately 130 to 2 PM that her repeat EKG shows STEMI.  She notified cardiology team and they will come and look at patient and  the EKG.  ________ _____________________  Temp:  [35.9 C (96.6 F)-36.9 C (98.4 F)] 36.9 C (98.4 F) Pulse:  [101-154] 119 SpO2 Pulse:  [101] 101 Resp:  [16-18] 18 BP: (80-119)/(49-85) 116/82 SpO2:  [97 %-100 %] 98 % Body mass index is 32.7 kg/m. Intake/Output last 3 shifts: I/O last 3 completed shifts: In: -  Out: 200 [Urine:200]  Consults Requested  IP CONSULT TO HOSPITALIST IP CONSULT TO CARDIOLOGY IP CONSULT TO CARDIOLOGY     In hospital Nutrition: Nutrition Therapy Clear Liquid    An advanced care planning discussion was had with patient and/or patient's decisions maker (documented separately).  CODE STATUS :                    Full Code   Discharge estimated within 1-2 days. Anticipated disposition:  To Home ______________________________________________________________  Current Medications[1] ________________________________________________________________  Allergies  Allergen Reactions  . Oxycodone  Hcl Rash and Other (See Comments)  . Penicillins Hives    Did it involve swelling of the face/tongue/throat, SOB, or low BP? N  Did it involve sudden or severe rash/hives, skin peeling, or any reaction on the inside of your mouth or nose? Y  Did you need to seek medical attention at a hospital or doctor's office? N  When did it last happen?  Childhood      If all above answers are "NO", may proceed with cephalosporin use.  Did it involve swelling of the face/tongue/throat, SOB, or low BP? N  Did it involve sudden or severe rash/hives, skin peeling, or any reaction on the inside of your mouth or nose? Y  Did you need to seek medical attention at a hospital or doctor's office? N  When did it last happen?  Childhood      If all above answers are NO, may proceed with cephalosporin use.  Did it involve swelling of the face/tongue/throat, SOB, or low BP? N  Did it involve sudden or severe rash/hives, skin peeling, or any reaction on the inside of your mouth  or nose? Y  Did you need to seek medical attention at a hospital or doctor's office? N  When did it last happen?  Childhood      If all above answers are "NO", may proceed with cephalosporin use.  . Pantoprazole  Other (See Comments)    Stomach pain  Pt states she is allergic to the medication has a prescription for it     Past Medical History[2]  Past Surgical History[3]   Family History[4]        Imaging  ECG 12 Lead Result Date: 05/14/2024 Sinus tachycardia with short PR ST & Marked T wave abnormality, consider anterolateral ischemia Prolonged QT Abnormal ECG When compared with ECG of 13-May-2024 23:46, PR interval has decreased  ECG 12 Lead Result Date: 05/14/2024 Normal sinus rhythm Possible Left atrial enlargement Left axis deviation ST & T wave abnormality, consider inferior ischemia ST & T wave abnormality, consider anterolateral ischemia Prolonged QT Abnormal ECG When compared with ECG of 05-Sep-2023 03:18, Significant changes have occurred Confirmed by Cherie Searle ([id number]) on 05/14/2024 12:34:38 AM  CT Abdomen Pelvis Wo Contrast Result Date: 05/13/2024 Exam:  CT of the abdomen and pelvis without contrast  History:  Nausea and vomiting     Technique:  Routine CT of the abdomen and pelvis without IV contrast.    AEC (automated exposure control) and/or manual techniques such as size-specific kV and mAs are employed where appropriate to reduce radiation exposure for all CT exams.  Comparison:  08/31/2023    Abdomen CT Findings:  LOWER THORAX:  Unremarkable.    HEPATOBILIARY:  Diminished attenuation throughout the liver consistent with diffuse steatosis. Cholecystectomy. Nondilated bile duct.    PANCREAS:  Normal.    SPLEEN:  Normal.    ADRENALS:  Normal.    KIDNEYS/URETERS:  Normal.     VASCULAR:  Nondilated aorta.     BOWEL/MESENTERY:  Large left anterior abdominal wall hernia contains nondilated segments of small bowel. Separate periumbilical hernia also contains nondilated  small bowel segments. No evidence for incarceration. Chronic changes from previous partial sigmoid colectomy.      PELVIS:  The bladder is unremarkable. No pelvic free fluid.      BONES:  Unremarkable.         1.    Anterior abdominal wall hernias containing nondilated segments of bowel, no evidence for obstruction or incarceration. No acute inflammatory changes.  2.    Hepatic steatosis.  3.    Additional chronic changes as above.  Signed (Electronic Signature): 05/13/2024 10:18 PM Signed By: Emeline KATHEE Milroy, MD   Lab Results   Recent Labs    05/14/24 0531  WBC 9.0  HGB 10.2*  HCT 29.2*  PLT 191   Recent Labs    05/13/24 2139 05/14/24 0531  NA 137 133*  K 2.7* 3.3*  CL 95* 94*  CO2 20.1* 22.9  BUN 11 10  CREATININE 0.69 0.95  GLU 18* 82  CALCIUM 8.5 8.4*  ALBUMIN  3.3* 3.4*  PROT 6.7 6.6  BILITOT 1.4* 1.7*  AST 173* 158*  ALT 47 44  ALKPHOS 244* 242*  MG 1.3* 1.2*  LIPASE 22  --    Recent Labs    05/14/24 0531  TROPONINI 130*   Recent Labs    05/14/24 0724  WBCUA 4*  NITRITE Negative  LEUKOCYTESUR Trace*  BACTERIA None Seen  RBCUA 1  BLOODU Negative  GLUCOSEU Negative  PROTEINUA 50 mg/dL*  KETONESU 60 mg/dL*   Recent Labs    89/71/74 2139  ETOH 98*   No results for input(s): PREGTESTUR, PREGPOC in the last 72 hours. No results for input(s): OCCULTBLD, RAPSCRN, CDIFRPCR, CDIFFNAP1, A1C, CHOL, LDL, HDL, TRIG in the last 72 hours. No results for input(s): O2SOUR, FIO2ART, PHART, PCO2ART, PO2ART, HCO3ART, O2SATART, BEART in the last 72 hours. Pending Labs     Order Current Status   hsTroponin I (serial 0-2-6H w/ delta) Collected (05/14/24 9191)   hsTroponin I - 2 Hour Collected (05/14/24 9191)   Hepatitis Panel, Acute In process   Urine Culture In process       Joana JONETTA Hurst, PA Hospitalist, [facility] 05/14/24, 8:09 AM       [1]  Current Facility-Administered Medications:  .  acetaminophen  (TYLENOL )  tablet 650 mg, 650 mg, Oral, Q4H PRN, Hugelmeyer, Alexis, DO .  acetaminophen  (TYLENOL ) tablet 650 mg, 650 mg, Oral, Q4H PRN, Hugelmeyer, Alexis, DO .  albuterol  2.5 mg /3 mL (0.083 %) nebulizer solution 2.5 mg, 2.5 mg, Nebulization, Q6H PRN, Hugelmeyer, Alexis, DO .  aluminum-magnesium  hydroxide-simethicone  (MAALOX MAX) 80-80-8 mg/mL oral suspension, 30 mL, Oral, Q4H PRN, Hugelmeyer, Alexis, DO .  bisacodyl  (DULCOLAX) EC tablet 10 mg, 10 mg,  Oral, Daily PRN, Hugelmeyer, Alexis, DO .  dextrose  (GLUTOSE) 40 % gel 15 g of dextrose , 15 g of dextrose , Oral, Q10 Min PRN, Hugelmeyer, Alexis, DO .  dextrose  10 % infusion, 125 mL/hr, Intravenous, Continuous, Gerome Rosaline Ruth, FNP, Last Rate: 125 mL/hr at 05/14/24 0058, 125 mL/hr at 05/14/24 0058 .  dextrose  50 % in water  (D50W) 50 % solution 12.5 g, 12.5 g, Intravenous, Q15 Min PRN, Hugelmeyer, Alexis, DO .  dicyclomine (BENTYL) capsule 10 mg, 10 mg, Oral, Q6H PRN, Hugelmeyer, Alexis, DO .  glucagon injection 1 mg, 1 mg, Intramuscular, Once PRN, Hugelmeyer, Alexis, DO .  guaiFENesin (ROBITUSSIN) oral syrup, 200 mg, Oral, Q4H PRN, Hugelmeyer, Alexis, DO .  hydrOXYzine  (ATARAX ) tablet 25 mg, 25 mg, Oral, BID PRN, Hugelmeyer, Alexis, DO .  lactated ringers  bolus 1,000 mL, 1,000 mL, Intravenous, Once, Collins, Rosaline Ruth, FNP .  lactated Ringers  infusion, 100 mL/hr, Intravenous, Continuous, Hugelmeyer, Alexis, DO, Last Rate: 100 mL/hr at 05/14/24 0337, 100 mL/hr at 05/14/24 0337 .  melatonin tablet 3 mg, 3 mg, Oral, Nightly PRN, Hugelmeyer, Alexis, DO .  naloxone (NARCAN) injection 0.1 mg, 0.1 mg, Intravenous, Q5 Min PRN, Hugelmeyer, Alexis, DO .  ondansetron  (ZOFRAN -ODT) disintegrating tablet 4 mg, 4 mg, Oral, Q8H PRN, 4 mg at 05/14/24 0738 **OR** ondansetron  (ZOFRAN -ODT) disintegrating tablet 8 mg, 8 mg, Oral, Q8H PRN, Hugelmeyer, Alexis, DO .  polyethylene glycol (MIRALAX ) packet 17 g, 17 g, Oral, Daily PRN, Hugelmeyer, Alexis, DO .  prochlorperazine   (COMPAZINE ) injection 2.5 mg, 2.5 mg, Intravenous, Q8H PRN **OR** prochlorperazine  (COMPAZINE ) injection 5 mg, 5 mg, Intravenous, Q8H PRN, Hugelmeyer, Alexis, DO [2] Past Medical History: Diagnosis Date  . A-fib (CMS-HCC)   . Hypertension   [3] Past Surgical History: Procedure Laterality Date  . CESAREAN SECTION  1993  . COLOSTOMY    . PR LAP,CHOLECYSTECTOMY Midline 09/03/2023   Procedure: LAPAROSCOPY, SURGICAL; CHOLECYSTECTOMY;  Surgeon: Rey Fonda PARAS, MD;  Location: OR [facility];  Service: Trauma  [4] Family History Problem Relation Age of Onset  . Breast cancer Mother 26  . COPD Mother   . Cancer Mother   . Hypertension Mother   . Atrial fibrillation Mother   . No Known Problems Father   . No Known Problems Son   . Breast cancer Maternal Aunt 53       2x

## 2024-05-14 NOTE — Consults (Signed)
 ------------------------------------------------------------------------------- Attestation signed by Freddy Blinks, MD at 05/14/24 2006 Attending Physician Attestation:  It was medically necessary for me to see the patient in addition to the APP. I personally saw, examined, and evaluated the patient, participating in the key portions of the service. I personally performed the substantive portion of this visit which included 24 minutes spent face-to-face and non-face-to-face in the care of this patient, which includes all pre, intra, and post visit time on the date of service which was specific to E/M visit and does not include any procedures that may have been performed. My substantive portion of the visit included medical decision making regarding the following problems.    Medical decision making: Problem List[1] I reviewed the current management plan with patient including where we stand currently, anticipated stay, and post-discharge care. I have reviewed the APP's note and agree with the documented findings and plan. The note reflects my participation and input in the interval history, physical exam, and complex medical decision making.    Blinks Freddy, MD Assistant Professor of Medicine [facility] Division of Cardiology      [1] Patient Active Problem List Diagnosis  . Mixed hyperlipidemia  . Acute cholecystitis  . Abdominal pain  . Abnormal liver enzymes  . Abnormal liver function  . Acute blood loss anemia  . Acute hepatitis  . Acute respiratory failure with hypoxemia    (CMS-HCC)  . Alcohol use  . Alcohol withdrawal    (CMS-HCC)  . Alcohol abuse  . Alcoholic hepatitis without ascites (CMS-HCC)  . Alcoholic liver disease (HHS-HCC)  . Alcoholism    (CMS-HCC)  . Anasarca  . Aphonia  . ARF (acute renal failure)  . Atrial flutter    (CMS-HCC)  . Benign essential hypertension  . Body mass index (BMI) 32.0-32.9, adult  . Colostomy in place    (CMS-HCC)  . Common bile duct  dilation  . Bronchiolitis  . Dermatitis  . Gastritis and gastroduodenitis  . Sepsis    (CMS-HCC)  . Chronic diarrhea  . Diarrhea with dehydration  . Diverticulosis of colon without hemorrhage  . Elevated alkaline phosphatase level  . Exposure to COVID-19 virus  . Fall at home, initial encounter  . BRBPR (bright red blood per rectum)  . Gastrointestinal hemorrhage  . Grade II hemorrhoids  . Hematochezia  . Hepatic encephalopathy    (CMS-HCC)  . History of colostomy reversal  . Status post colostomy    (CMS-HCC)  . History of necrotizing fasciitis  . Hypernatremia  . Hypertension  . Hypoalbuminemia due to protein-calorie malnutrition (HHS-HCC)  . Hypoglycemia  . Hypokalemia  . Hypomagnesemia  . Hyponatremia  . Iron deficiency anemia  . Luetscher's syndrome  . Melena  . Malnutrition of moderate degree (HHS-HCC)  . Noninfectious gastroenteritis  . Normocytic anemia  . 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 Pancolitis  . Parainfluenza infection  . Paralysis of right vocal cord  . Paresthesia of skin  . Atrial fibrillation    (CMS-HCC)  . Nausea & vomiting  . Peptic ulcer disease  . Necrotizing fasciitis of pelvic region and thigh    (CMS-HCC)  . Prolonged QT interval  . Pure hypertriglyceridemia  . Reactive airway disease (HHS-HCC)  . SBO (small bowel obstruction) (CMS-HCC)  . Hyperbilirubinemia  . Serum total bilirubin elevated  . Skin ulcer of sacrum    (CMS-HCC)  . Stercoral ulcer of rectum  . Swelling of lower extremity  . Thrombocytopenia  . Transaminitis  . Vocal fold paresis,  left  . Weakness  ------------------------------------------------------------------------------- Assessment/Plan:  1.  EKG changes - Initial EKG NSR rate of 100 with leftward axis, prolonged QT and T wave inversion in 1 2 and V3 through V6 per hospitalist interpretation - Continue serial EKGs every 6 hours x 3.  Continue trending troponins. - Continue telemetry monitoring while  inpatient. - Patient denies any anginal symptoms.  Does have abdominal pain with recent nausea, vomiting, diarrhea abdominal pain.  She is pending hernia surgery with Dr. Rubin - No active chest pain.  See repeat echo #2 below.  2.  Elevated troponins - Hospitalist ordered trending troponins for above EKG changes. - Trending troponins 130 => 114 => 93 => 94 => 72.  Downtrending. - Denies any anginal symptoms.   - Continue trending troponins and EKGs - Patient having nausea, vomiting, diarrhea, abdominal pain times at least 4 days. - She is pending hernia surgery based on review of documentation.  See #5 below. - Recent echocardiogram at [facility] health In December 2024 demonstrated normal EF 55 to 60% LV upper limit of normal.  Wall motion was normal trace tricuspid regurgitation -Repeat echocardiogram today demonstrates LV upper normal size with normal wall thickness, EF 60 to 65% cannot rule out hypokinesis of the poorly visualized LV apex.  Recommendation is to perform follow-up limited echo with contrast to assess for possible Takotsubo's.  Limited echo with contrast ordered for a.m.   3.  History of atrial fibrillation - This appears to have been an isolated incident.  On no anticoagulation per recent cardiology note at [facility] heart care. - Recent ZIO monitor showed no evidence of atrial fibrillation or flutter or ventricular arrhythmias.  No high-grade AV block or pauses.  Less than 1% PAC burden less than 1% PVC burden.  Had 1 run of nonsustained V. tach lasting 4 beats.  Patient triggered events correlated with normal sinus rhythm 99 bpm - Will need to follow-up with her primary cardiologist post discharge.  4.  Alcohol use disorder - Prior history of documented alcohol abuse. - Denies any alcohol use today.  5.  Pending hernia surgery - As of 05/09/2024 she was pending hernia surgery with Dr. Lynda Rubin of [facility] surgery. - Recently underwent preoperative  cardiovascular risk assessment with [facility] via telemedicine visit - She was deemed to be at least moderate risk for complications without adequate follow-up on May 09, 2024   6.  Electrolyte abnormalities.  Hypokalemia, hypomagnesemia, - Potassium initially 2.7 on arrival.  Receiving potassium repletion by hospitalist service - Magnesium  1.2.  Receiving IV magnesium  by hospitalist service - Hospitalist service managing.   7.  Hypoglycemia. - Blood sugar significantly low on arrival.  Receiving D10 infusion - Blood sugars improving.  Currently 102 - Hospitalist service managing.   8.  Abdominal pain, nausea, vomiting, diarrhea, elevated LFTs. - RUQ ultrasound.  Enlarged liver with diffuse increased echogenicity is nonspecific but most commonly seen with hepatic steatosis.  Enlarged common bile duct within normal notes for reservoir effect post cholecystectomy - She is status post cholecystectomy and has had nausea, vomiting, diarrhea and abdominal pain since - She does have a history of alcoholic liver disease and peptic ulcer disease/GI bleed. - AST 158, ALP 242, lipase 22  History of Present Illness:  Chief Complaint  Alyssa Carey is evaluated at request of Hospitalist in regards to EKG changes and elevated troponins.  Has past medical history of peptic ulcer disease, alcoholic liver disease, status postcholecystectomy,  Patient presented yesterday to [facility] ED for complaints of nausea, vomiting and diarrhea for the last 6 days.  Patient stated that since this started on Thursday and she was seen by PCP on Friday for the same problem.  She was prescribed medication but stated she was not any better.  Per ED provider she was given Zofran  by PCP and tried to drink fluids but this was not helping.  She is unable to keep anything down and was feeling weak.  She had some chills at times in addition.  Patient stated that some of her coworkers had been  experiencing the same or similar symptoms.   ED workup yesterday demonstrated CBC with hemoglobin of 10.4 and hematocrit 30.8.  Chemistry with potassium of 2.7, chloride 95, CO2 20.1, anion gap greater than 15, glucose of 18, magnesium  of 1.3, albumin  3.3, total bilirubin 1.4, AST 173, alkaline phosphatase 244.  Troponins were 130 and walked 14 respectively.  POC glucoses were running low initially at 12 later 32 and later in the evening 78.  This morning glucose is 124, urine culture was sent..  Urine drug screen was positive for barbiturates, urinalysis with a specific gravity 1.02, cloudy clarity, trace leukocyte esterase, 50 mg/dL protein, ketones 60 mg/dL, urobilinogen 4.0 mg/dL, small bilirubin, WBC 4, rare mucus, hyaline cast 3. Initial EKG yesterday evening at 11:46 PM demonstrated normal sinus rhythm rate of 100 possible left atrial enlargement, left axis deviation, ST and T wave abnormality consider inferior and anterolateral ischemia.  Second EKG this morning at 7:53 AM demonstrated sinus tachycardia with short PR interval ST and marked T wave abnormality consider anterolateral ischemia rate of 115.  CT of the abdomen showed anterior abdominal wall hernias containing nondilated segments of bowel no evidence of obstruction or incarceration.  No acute inflammatory changes, hepatic steatosis  She was treated initially in the emergency department with IV Pepcid , IV Toradol , IV Zofran .  She was given oral potassium 40 mill equivalents, magnesium  oxide 400 mg p.o., IV D10 infusion for significantly low blood sugars, IV Benadryl  and IV Compazine .  She was started on IV lactated Ringer 's infusion of 100 mL/h and given a second dose of IV Toradol  for abdominal pain.   She was admitted to hospitalist service with diagnoses of; electrolyte abnormalities likely secondary to steroid enteritis, hypoglycemia, abnormal EKG.  Hospitalist ordered trending troponins, cardiology consult in a.m.,.  Her LFTs were  elevated hospital ordered hepatitis panel and right upper quadrant ultrasound if needed given history of EtOH hepatitis.   Of note, she was last seen by cardiology on February 18, 2024 at [facility] heart care after being referred to cardiology for evaluation of A-fib and CHF.  She was seen by Dr. Mallipedi.  Dr. Adelfa Jerry reviewed all of her EKGs in electronic medical record that showed NSR and sinus tachycardia but never A-fib.  Patient reported that she was told that she had A-fib in the hospital by one of the nurses.  She never had any prior palpitations, DOE, fatigue or angina.  She had recently undergone a cholecystectomy and had a history of necrotizing fasciitis of her perineum.  She was not a candidate for SGLT T2 inhibitors in the future if she needed them.  She denied smoking or alcohol use.  Plan was to place a 2-week event monitor to rule out PAF.  Her previous echocardi[facility] performed in 2024 at [facility] showed normal LVEF and no valvular heart disease.  She denied any symptoms of DOE or leg  swelling.  Per Dr. Bettyjo she did not think patient had criteria for diagnosis of diastolic heart failure at the time.   Allergies: Oxycodone  hcl, Penicillins, and Pantoprazole   Medications:  Current Medications[2]  Medical History: Past Medical History[3]  Surgical History: Past Surgical History[4]  Social History: Tobacco use: Tobacco Use History[5]  Alcohol use:  Social History   Substance and Sexual Activity  Alcohol Use No   Comment: quit 03/2023    Drug use:  Social History   Substance and Sexual Activity  Drug Use No     Family History: The patient's family history includes Atrial fibrillation in her mother; Breast cancer (age of onset: 72) in her maternal aunt; Breast cancer (age of onset: 86) in her mother; COPD in her mother; Cancer in her mother; Hypertension in her mother; No Known Problems in her father and son..  Code Status: Full Code  Review of  Systems: Review of Systems  Constitutional:  Positive for activity change.  HENT: Negative.    Eyes: Negative.   Respiratory: Negative.    Cardiovascular: Negative.   Gastrointestinal:  Positive for abdominal pain, diarrhea, nausea and vomiting.  Endocrine: Negative.   Genitourinary: Negative.   Musculoskeletal: Negative.   Skin: Negative.   Allergic/Immunologic: Negative.   Neurological: Negative.   Hematological: Negative.   Psychiatric/Behavioral: Negative.      Objective:   Patient Vitals for the past 8 hrs:  BP Temp Temp src Pulse Resp SpO2 Weight  05/14/24 0900 -- -- -- 110 -- -- --  05/14/24 0800 -- -- -- 120 -- -- --  05/14/24 0742 116/82 -- -- -- -- -- --  05/14/24 0733 80/49 36.9 C (98.4 F) Oral 119 18 98 % --  05/14/24 0600 -- -- -- 112 -- -- --  05/14/24 0500 -- -- -- 118 -- -- --  05/14/24 0453 -- -- -- 154 -- -- --  05/14/24 0445 -- -- -- -- -- -- 68.5 kg (151 lb 1.6 oz)  05/14/24 0444 -- -- -- 145 -- -- --  05/14/24 0430 98/63 36.9 C (98.4 F) Oral 117 16 100 % --  05/14/24 0400 -- -- -- 129 -- -- --  05/14/24 0322 -- -- -- 114 -- -- --  05/14/24 0149 119/83 36.8 C (98.2 F) Oral 121 17 98 % --   No intake/output data recorded.  Physical Exam Physical Exam Constitutional:      Appearance: She is obese.  Cardiovascular:     Rate and Rhythm: Regular rhythm. Bradycardia present.     Pulses: Normal pulses.     Heart sounds: Normal heart sounds.  Pulmonary:     Effort: Pulmonary effort is normal.     Breath sounds: Normal breath sounds.  Abdominal:     Tenderness: There is abdominal tenderness.  Musculoskeletal:        General: Normal range of motion.     Cervical back: Normal range of motion.  Skin:    General: Skin is warm and dry.     Capillary Refill: Capillary refill takes less than 2 seconds.  Neurological:     General: No focal deficit present.     Mental Status: She is oriented to person, place, and time.  Psychiatric:        Mood and  Affect: Mood normal.        Behavior: Behavior normal.        Thought Content: Thought content normal.        Judgment: Judgment normal.  Test Results Data Review:   Recent Results (from the past 72 hours)  Comprehensive Metabolic Panel   Collection Time: 05/13/24  9:39 PM  Result Value Ref Range   Sodium 137 135 - 145 mmol/L   Potassium 2.7 (L) 3.5 - 5.0 mmol/L   Chloride 95 (L) 98 - 107 mmol/L   CO2 20.1 (L) 21.0 - 32.0 mmol/L   Anion Gap >15 (H) 3 - 11 mmol/L   BUN 11 8 - 20 mg/dL   Creatinine 9.30 9.39 - 1.10 mg/dL   BUN/Creatinine Ratio 16    eGFR CKD-EPI (2021) Female >90 >=60 mL/min/1.54m2   Glucose 18 (LL) 70 - 179 mg/dL   Calcium 8.5 8.5 - 89.8 mg/dL   Albumin  3.3 (L) 3.5 - 5.0 g/dL   Total Protein 6.7 6.0 - 8.0 g/dL   Total Bilirubin 1.4 (H) 0.3 - 1.2 mg/dL   AST 826 (H) 15 - 40 U/L   ALT 47 12 - 78 U/L   Alkaline Phosphatase 244 (H) 46 - 116 U/L  Lipase   Collection Time: 05/13/24  9:39 PM  Result Value Ref Range   Lipase 22 16 - 77 U/L  Magnesium    Collection Time: 05/13/24  9:39 PM  Result Value Ref Range   Magnesium  1.3 (L) 1.6 - 2.6 mg/dL  CBC w/ Differential   Collection Time: 05/13/24  9:39 PM  Result Value Ref Range   WBC 9.2 4.0 - 10.5 10*9/L   RBC 3.29 (L) 3.80 - 5.10 10*12/L   HGB 10.4 (L) 11.5 - 15.0 g/dL   HCT 69.1 (L) 65.9 - 55.9 %   MCV 93.6 80.0 - 98.0 fL   MCH 31.6 27.0 - 34.0 pg   MCHC 33.8 32.0 - 36.0 g/dL   RDW 81.9 (H) 88.4 - 85.4 %   MPV 9.7 7.4 - 10.4 fL   Platelet 193 140 - 415 10*9/L   Neutrophils % 66.6 %   Lymphocytes % 29.6 %   Monocytes % 2.4 %   Eosinophils % 0.9 %   Basophils % 0.2 %   Absolute Neutrophils 6.2 1.8 - 7.8 10*9/L   Absolute Lymphocytes 2.7 0.7 - 4.5 10*9/L   Absolute Monocytes 0.2 0.1 - 1.0 10*9/L   Absolute Eosinophils 0.1 0.0 - 0.4 10*9/L   Absolute Basophils 0.0 0.0 - 0.2 10*9/L  Ethanol   Collection Time: 05/13/24  9:39 PM  Result Value Ref Range   Alcohol, Ethyl 98 (H) <=10 mg/dL  POCT  Glucose   Collection Time: 05/13/24 10:15 PM  Result Value Ref Range   Glucose, POC 12 (LL) 70 - 105 mg/dL  POCT Glucose   Collection Time: 05/13/24 10:54 PM  Result Value Ref Range   Glucose, POC 32 (LL) 70 - 105 mg/dL  POCT Glucose   Collection Time: 05/13/24 11:43 PM  Result Value Ref Range   Glucose, POC 78 70 - 105 mg/dL  ECG 12 Lead   Collection Time: 05/13/24 11:46 PM  Result Value Ref Range   EKG Systolic BP  mmHg   EKG Diastolic BP  mmHg   EKG Ventricular Rate 100 BPM   EKG Atrial Rate 100 BPM   EKG P-R Interval 138 ms   EKG QRS Duration 86 ms   EKG Q-T Interval 434 ms   EKG QTC Calculation 559 ms   EKG Calculated P Axis 38 degrees   EKG Calculated R Axis -41 degrees   EKG Calculated T Axis 174 degrees   QTC Fredericia 515  ms  POCT Glucose   Collection Time: 05/14/24  4:15 AM  Result Value Ref Range   Glucose, POC 124 (H) 70 - 105 mg/dL  hsTroponin I (serial 9-7-3Y w/ delta)   Collection Time: 05/14/24  5:31 AM  Result Value Ref Range   hsTroponin I 130 (HH) <=34 ng/L  Magnesium  Level   Collection Time: 05/14/24  5:31 AM  Result Value Ref Range   Magnesium  1.2 (L) 1.6 - 2.6 mg/dL  Comprehensive metabolic panel   Collection Time: 05/14/24  5:31 AM  Result Value Ref Range   Sodium 133 (L) 135 - 145 mmol/L   Potassium 3.3 (L) 3.5 - 5.0 mmol/L   Chloride 94 (L) 98 - 107 mmol/L   CO2 22.9 21.0 - 32.0 mmol/L   Anion Gap >15 (H) 3 - 11 mmol/L   BUN 10 8 - 20 mg/dL   Creatinine 9.04 9.39 - 1.10 mg/dL   BUN/Creatinine Ratio 11    eGFR CKD-EPI (2021) Female 73 >=60 mL/min/1.32m2   Glucose 82 70 - 179 mg/dL   Calcium 8.4 (L) 8.5 - 10.1 mg/dL   Albumin  3.4 (L) 3.5 - 5.0 g/dL   Total Protein 6.6 6.0 - 8.0 g/dL   Total Bilirubin 1.7 (H) 0.3 - 1.2 mg/dL   AST 841 (H) 15 - 40 U/L   ALT 44 12 - 78 U/L   Alkaline Phosphatase 242 (H) 46 - 116 U/L  CBC   Collection Time: 05/14/24  5:31 AM  Result Value Ref Range   WBC 9.0 4.0 - 10.5 10*9/L   RBC 3.24 (L) 3.80 -  5.10 10*12/L   HGB 10.2 (L) 11.5 - 15.0 g/dL   HCT 70.7 (L) 65.9 - 55.9 %   MCV 90.1 80.0 - 98.0 fL   MCH 31.5 27.0 - 34.0 pg   MCHC 34.9 32.0 - 36.0 g/dL   RDW 82.1 (H) 88.4 - 85.4 %   MPV 9.3 7.4 - 10.4 fL   Platelet 191 140 - 415 10*9/L  Urinalysis with Microscopy with Culture Reflex   Collection Time: 05/14/24  7:24 AM  Result Value Ref Range   Color, UA Orange    Clarity, UA Cloudy (A) Clear   Specific Gravity, UA 1.029 (H) 1.010 - 1.025   pH, UA 6.5 5.0 - 8.0   Leukocyte Esterase, UA Trace (A) Negative   Nitrite, UA Negative Negative   Protein, UA 50 mg/dL (A) Negative   Glucose, UA Negative Negative, Trace   Ketones, UA 60 mg/dL (A) Negative   Urobilinogen, UA 4.0 mg/dL (A) <7.9 mg/dL   Bilirubin, UA Small (A) Negative   Blood, UA Negative Negative   RBC, UA 1 0 - 3 /HPF   WBC, UA 4 (H) 0 - 3 /HPF   Squam Epithel, UA 2 0 - 10 /HPF   Bacteria, UA None Seen None Seen /HPF   WBC Clumps None Seen None Seen /HPF   Hyphal Yeast None Seen None Seen /HPF   Hyaline Casts, UA 3 (H) <=0 /LPF   Yeast, UA None Seen None Seen /HPF   Mucus, UA Rare (A) None Seen /HPF  ECG 12 Lead   Collection Time: 05/14/24  7:53 AM  Result Value Ref Range   EKG Systolic BP  mmHg   EKG Diastolic BP  mmHg   EKG Ventricular Rate 115 BPM   EKG Atrial Rate 115 BPM   EKG P-R Interval 100 ms   EKG QRS Duration 80 ms   EKG  Q-T Interval 430 ms   EKG QTC Calculation 594 ms   EKG Calculated P Axis -1 degrees   EKG Calculated R Axis -25 degrees   EKG Calculated T Axis 182 degrees   QTC Fredericia 533 ms  POCT Glucose   Collection Time: 05/14/24  8:00 AM  Result Value Ref Range   Glucose, POC 84 70 - 105 mg/dL  hsTroponin I - 2 Hour   Collection Time: 05/14/24  8:08 AM  Result Value Ref Range   hsTroponin I 114 (HH) <=34 ng/L   delta hsTroponin I 16 (H) <=7 ng/L  Drug Screen, Urine   Collection Time: 05/14/24  8:16 AM  Result Value Ref Range   Amphetamines Screen, Ur Negative Negative    Barbiturates Screen, Ur Positive (A) Negative   Benzodiazepines Screen, Urine Negative Negative   Cannabinoids Screen, Ur Negative Negative   Cocaine(Metab.)Screen, Urine Negative Negative   Methadone Screen, Urine Negative Negative   Opiates Screen, Ur Negative Negative   Oxycodone  Screen, Ur Negative Negative  POCT Glucose   Collection Time: 05/14/24  8:43 AM  Result Value Ref Range   Glucose, POC 86 70 - 105 mg/dL  POCT Glucose   Collection Time: 05/14/24  9:33 AM  Result Value Ref Range   Glucose, POC 102 70 - 105 mg/dL    Imaging: Radiology studies were personally reviewed   Cardiac event monitor 02/18/2024  heart care Narrative  This result has an attachment that is not available. SABRA  Patch wear time was for 13 days and 23 hours. .  Normal sinus rhythm ranging from 65 to 181 bpm with an average HR 105 bpm. .  1 run of nonsustained SVT occurred that lasted for 4 beats. .  No evidence of atrial fibrillation/flutter, ventricular arrhythmias, high-grade AV block or pauses. .  <1% PAC burden and <1% PVC burden. .  Patient triggered events correlate with NSR, 99 bpm. Exam End: 03/21/24 13:26    Echocardiogram 06/17/2023 at [facility] Impression  Left Ventricle: Left ventricle size is upper limit of  normal (manual LVIDd 5.2cm) .  Left Ventricle: Systolic function is normal. EF: 55-60%.  Normal 2D echo in the presence of sinus tachycardia. Narrative  . Left Ventricle Left ventricle size is upper limit of normal (manual LVIDd 5.2cm) Wall thickness is normal. Systolic function is normal. EF: 55-60%. Wall motion is normal. Unable to assess diastolic function due to E/A wave fusion from tachycardia.  Right Ventricle Right ventricle size is normal. Systolic function is normal.  Left Atrium Left atrium size is normal.  Right Atrium Right atrium size is normal.  Mitral Valve Mitral valve structure is normal. There is no mitral regurgitation.  Tricuspid  Valve Tricuspid valve structure is normal. There is trace regurgitation. The right ventricular systolic pressure is normal (<36 mmHg).  Aortic Valve The aortic valve is tricuspid. The leaflets are not thickened and exhibit normal excursion. There is no regurgitation or stenosis.  Pulmonic Valve Trace regurgitation. There is no evidence of pulmonic valve stenosis.  Ascending Aorta The aortic root is normal in size. The ascending aorta is normal in size.  Pericardium There is no pericardial effusion.  Study Details     [2] Current Facility-Administered Medications  Medication Dose Route Frequency Provider Last Rate Last Admin  . acetaminophen  (TYLENOL ) tablet 650 mg  650 mg Oral Q4H PRN Hugelmeyer, Alexis, DO      . acetaminophen  (TYLENOL ) tablet 650 mg  650 mg Oral Q4H PRN Hugelmeyer, Alexis,  DO      . albuterol  2.5 mg /3 mL (0.083 %) nebulizer solution 2.5 mg  2.5 mg Nebulization Q6H PRN Hugelmeyer, Alexis, DO      . aluminum-magnesium  hydroxide-simethicone  (MAALOX MAX) 80-80-8 mg/mL oral suspension  30 mL Oral Q4H PRN Hugelmeyer, Alexis, DO      . bisacodyl  (DULCOLAX) EC tablet 10 mg  10 mg Oral Daily PRN Hugelmeyer, Alexis, DO      . dextrose  (GLUTOSE) 40 % gel 15 g of dextrose   15 g of dextrose  Oral Q10 Min PRN Hugelmeyer, Alexis, DO      . dextrose  10 % infusion  125 mL/hr Intravenous Continuous Gerome Rosaline Ruth, FNP 125 mL/hr at 05/14/24 0058 125 mL/hr at 05/14/24 0058  . dextrose  50 % in water  (D50W) 50 % solution 12.5 g  12.5 g Intravenous Q15 Min PRN Hugelmeyer, Alexis, DO      . dicyclomine (BENTYL) capsule 10 mg  10 mg Oral Q6H PRN Hugelmeyer, Alexis, DO      . glucagon injection 1 mg  1 mg Intramuscular Once PRN Hugelmeyer, Alexis, DO      . guaiFENesin (ROBITUSSIN) oral syrup  200 mg Oral Q4H PRN Hugelmeyer, Alexis, DO      . hydrOXYzine  (ATARAX ) tablet 25 mg  25 mg Oral BID PRN Hugelmeyer, Alexis, DO      . lactated ringers  bolus 1,000 mL  1,000 mL Intravenous  Once Gerome Rosaline Ruth, FNP      . lactated Ringers  infusion  100 mL/hr Intravenous Continuous Hugelmeyer, Alexis, DO 100 mL/hr at 05/14/24 0337 100 mL/hr at 05/14/24 9662  . magnesium  sulfate in D5W 1 gram/100 mL infusion 1 g  1 g Intravenous Q1H Panwala, Naitik Dhansukh, PA      . melatonin tablet 3 mg  3 mg Oral Nightly PRN Hugelmeyer, Alexis, DO      . naloxone (NARCAN) injection 0.1 mg  0.1 mg Intravenous Q5 Min PRN Hugelmeyer, Alexis, DO      . ondansetron  (ZOFRAN -ODT) disintegrating tablet 4 mg  4 mg Oral Q8H PRN Hugelmeyer, Alexis, DO   4 mg at 05/14/24 9261   Or  . ondansetron  (ZOFRAN -ODT) disintegrating tablet 8 mg  8 mg Oral Q8H PRN Hugelmeyer, Alexis, DO      . polyethylene glycol (MIRALAX ) packet 17 g  17 g Oral Daily PRN Hugelmeyer, Alexis, DO      . potassium chloride  ER tablet 40 mEq  40 mEq Oral BID Panwala, Naitik Dhansukh, PA      . prochlorperazine  (COMPAZINE ) injection 2.5 mg  2.5 mg Intravenous Q8H PRN Hugelmeyer, Alexis, DO       Or  . prochlorperazine  (COMPAZINE ) injection 5 mg  5 mg Intravenous Q8H PRN Hugelmeyer, Alexis, DO      [3] Past Medical History: Diagnosis Date  . A-fib (CMS-HCC)   . Hypertension   [4] Past Surgical History: Procedure Laterality Date  . CESAREAN SECTION  1993  . COLOSTOMY    . PR LAP,CHOLECYSTECTOMY Midline 09/03/2023   Procedure: LAPAROSCOPY, SURGICAL; CHOLECYSTECTOMY;  Surgeon: Rey Fonda PARAS, MD;  Location: OR [facility];  Service: Trauma  [5] Social History Tobacco Use  Smoking Status Never  Smokeless Tobacco Never

## 2024-05-15 NOTE — Nursing Note (Signed)
   05/15/24 1156  Rapid Rounds  Attendance Nursing;Physician;Advanced Practice Provider (APP);Case Manager;Dietitian/Nutrition Services;Occupational Therapy;Pharmacy;Physical Therapy;Respiratory Therapy;Social Work/Services;Speech Language Pathology  Expected Discharge Disposition Home  Today we still await: Clinical stability;Diagnostic workup    Waiting on ECHO results and cardiology consult recommendations.  Will discharge in 2 to 3 days

## 2024-05-15 NOTE — Care Plan (Signed)
 Shift Summary Ondansetron  was given PRN for nausea related to medication, with no further documentation of nausea after administration. HYDROcodone -acetaminophen  was administered PRN for pain, and pain score decreased from 6 to 1 after intervention. Fall prevention measures were maintained throughout the shift, including bed alarms and safety devices for transfers. Aseptic technique and environmental surveillance were consistently documented, and temperature remained stable. The patient remained awake during hourly visual checks, and overall status was stable with interventions as needed.  Absence of Fall and Fall-Related Injury: Fall prevention strategies such as bed alarms, safety devices for transfers, and hourly visual checks were consistently maintained throughout the shift, with the patient remaining awake and the room door open during checks.  Absence of Infection Signs and Symptoms: Aseptic technique and environmental surveillance were maintained at all documented intervals, and temperature remained stable throughout the shift.  Nausea and Vomiting Relief: Ondansetron  was administered PRN for nausea related to medication, and no further documentation of nausea was noted after intervention.

## 2024-05-15 NOTE — Progress Notes (Signed)
 PROGRESS NOTE [facility] 05/15/24    Patient name: Alyssa Carey. Alyssa Carey DOB Sep 02, 1970 MRN#: [id number] PCP: Lari Elspeth Holmes, MD Time: 9:01 AM Primary Care Provider:  Lari Elspeth Holmes, MD Inpatient primary attending provider: Joana Marice Hurst, [facility] Course: No notes on file  _______________________________________________________________   Admission HPI  Patient admitted on: 05/13/2024  6:46 PM  Patient admitted by: Thersia Roger, D.O.   CHIEF COMPLAINT:  N/V   Day of admission HPI:  Alyssa Carey  is a 53 y.o. female with a PMH significant for HLD, EtOH hepatitis, HTN, aflutter,  who presented with six days history of N/V/D.  She did have some coworkers who have similar symptoms.     Otherwise there has been no change in status. Patient has been taking medication as prescribed and there has been no recent change in medication or diet. There has been no recent illness/hospitalizations, travel or sick contacts.  Patient denies fevers/chills, weakness, dizziness, chest pain, shortness of breath, abdominal pain, dysuria/frequency, changes in mental status.  In the ED the patient received diphenhydramine , famotidine , ketorolac , LR, ondansetron , KCL, prochlorperazine .   Medical admission was requested for further workup and management of Significant electrolyte abnormalities including hypokalemia, hypomag, and hypoglycemia, elevated LFTs.      Patient admitted on Home O2? - No Patient on home anticoagulant? -  No Patient admitted with Chronic home foley catheter? - No Foley catheter placed or replaced by another service prior to admission? - No Central Line Status: None   Mental Status on Admission: The patient IS Alert and oriented to PERSON The patient IS  Alert And oriented to TIME The patient IS Alert and oriented to LOCATION   Problem List, Assessment & Plan     ASSESSMENT & PLAN (In order of descending acuity)   53  y.o. female with a PMH significant for HLD, EtOH hepatitis, HTN, aflutter,  now admitted with:     Electrolyte abnormalities likely 2/2 gastroenteritis Patient is symptomatic with: N/V/D PE: Well appearing, MMM Labs: Mg 1.3, K 2.7, Glycose 12 Imaging reveals: CT abdomen negative for acute processes - Admit obs tele - Replace lytes as needed - Check BMP at 0200 - Check stool culture - Check EKG - Clear liquid diet and advance as tolerated - Aspiration precautions    Hypoglycemia - Continue dextrose  10% infusion - Hourly accuchecks   Abnormal EKG likely related to above.  No chest pain - EKG: NSR at 100 bpm with leftward axis, prolonged QT and TWI in I, II, V3-6 - Trend trops - Cardio consult in AM Elevated LFTs - Check hepatitis panel - Consider RUQ US  if needed given h/o EtOH hepatitis.      Admission status: Obs tele IV Fluids: LR Diet/Nutrition: Clear liquid Consults: PT DVT Px: SCDs and early ambulation Code Status: Full Code Disposition Plan: To home in <24 hours _______________________________________   May 15, 2024 => Patient being closely managed by cardiology team Had elevated troponins yesterday that began downtrending EKG appeared normal with ST depressions in lead I, II and V5  Then she had a EKG that showed STEMI  Was evaluated by Dr. Nonnie on campus and recommended a repeat echo to be done on Thursday morning.  Repeat limited echo.  Completed this morning and awaiting further recommendations from cardiology department. Cardiology recommends medical management of patient with NSTEMI with DAPT and high intensity statin with close follow-up with primary cardiologist at [facility] for ischemic workup. Patient referred to [facility]  primary cardiologist but they are unable to take her on a timely basis.  I have reached out to [facility] and asked her to pass on the information to [facility] and see what they recommend based on patient's current cardiac  status.   ______________________________  Temp:  [36.8 C (98.2 F)-37 C (98.6 F)] 36.8 C (98.2 F) Pulse:  [86-129] 97 Resp:  [16-19] 16 BP: (99-116)/(64-87) 116/73 SpO2:  [97 %-100 %] 100 % Body mass index is 31.96 kg/m. Intake/Output last 3 shifts: I/O last 3 completed shifts: In: 880 [P.O.:880] Out: 650 [Urine:650]  Consults Requested  IP CONSULT TO HOSPITALIST IP CONSULT TO CARDIOLOGY IP CONSULT TO CARDIOLOGY     In hospital Nutrition: Nutrition Therapy Clear Liquid    An advanced care planning discussion was had with patient and/or patient's decisions maker (documented separately).  CODE STATUS :                    Full Code   Discharge estimated within 1-2 days. Anticipated disposition:  To Home ______________________________________________________________  Current Medications[1] ________________________________________________________________  Allergies  Allergen Reactions  . Oxycodone  Hcl Rash and Other (See Comments)  . Penicillins Hives    Did it involve swelling of the face/tongue/throat, SOB, or low BP? N  Did it involve sudden or severe rash/hives, skin peeling, or any reaction on the inside of your mouth or nose? Y  Did you need to seek medical attention at a hospital or doctor's office? N  When did it last happen?  Childhood      If all above answers are "NO", may proceed with cephalosporin use.  Did it involve swelling of the face/tongue/throat, SOB, or low BP? N  Did it involve sudden or severe rash/hives, skin peeling, or any reaction on the inside of your mouth or nose? Y  Did you need to seek medical attention at a hospital or doctor's office? N  When did it last happen?  Childhood      If all above answers are NO, may proceed with cephalosporin use.  Did it involve swelling of the face/tongue/throat, SOB, or low BP? N  Did it involve sudden or severe rash/hives, skin peeling, or any reaction on the inside of your mouth or nose? Y   Did you need to seek medical attention at a hospital or doctor's office? N  When did it last happen?  Childhood      If all above answers are "NO", may proceed with cephalosporin use.  . Pantoprazole  Other (See Comments)    Stomach pain  Pt states she is allergic to the medication has a prescription for it     Past Medical History[2]  Past Surgical History[3]   Family History[4]        Imaging  ECG 12 Lead Result Date: 05/14/2024 Sinus tachycardia Possible Left atrial enlargement Minimal voltage criteria for LVH, may be normal variant ( R in aVL ) Anterior infarct , age undetermined Marked ST abnormality, possible lateral subendocardial injury Prolonged QT Abnormal ECG When compared with ECG of 14-May-2024 14:03, No significant change was found  Echocardiogram Follow Up/Limited Echo Result Date: 05/14/2024 Patient Info Name:     Seraphina Mitchner Age:     53 years DOB:     1971-04-16 Gender:     Female MRN:     [id number] Accession #:     [id number] [facility] Account #:     [id number] Ht:     145 cm Wt:  69 kg BSA:     1.69 m2 BP:     80 /     49 mmHg HR:     110 bpm Exam Date:     05/14/2024 1:51 PM Admit Date:     05/13/2024 Exam Type:     ECHOCARDIOGRAM FOLLOW UP/LIMITED ECHO Technical Quality:     Good Staff Sonographer:     Mliss Gate Supervising Physician:     Concha Lonni An MD Ordering Physician:     Prentice Jama Medley Study Info Indications      - abnormal ekg ,  elevated troponins Procedure(s)   Limited 2D transthoracic echocardiogram is performed. Summary   1. The left ventricle is upper normal in size with normal wall thickness.   2. The left ventricular systolic function is normal, LVEF is visually estimated at 60-65%.   3. Cannot rule out hypokinesis of the poorly visualized LV apex.   4. The right ventricle is normal in size, with normal systolic function. Left Ventricle   The left ventricle is upper normal in size with normal wall thickness. The left  ventricular systolic function is normal, LVEF is visually estimated at 60-65%. Cannot rule out hypokinesis of the poorly visualized LV apex. Right Ventricle   The right ventricle is normal in size, with normal systolic function. Left Atrium   The left atrium is normal in size. Right Atrium   The right atrium is normal in size. Aortic Valve   The aortic valve is trileaflet with normal appearing leaflets with normal excursion. Mitral Valve   The mitral valve leaflets are normal with normal leaflet mobility. Tricuspid Valve   The tricuspid valve is not well visualized. Pulmonic Valve   Pulmonary valve is not well visualized. Aorta   The aorta is normal in size in the visualized segments. Inferior Vena Cava   The IVC is not well visualized precluding the ability to accurate assess right atrial pressure. Pericardium/Pleural   There is no pericardial effusion. Other Findings   Rhythm: Tachycardia. Ventricles ---------------------------------------------------------------------- Name                                 Value        Normal ---------------------------------------------------------------------- LV Dimensions 2D/MM ----------------------------------------------------------------------  IVS Diastolic Thickness (2D)                                1.1 cm       0.6-0.9 LVID Diastole (2D)                  5.4 cm       3.8-5.2  LVPW Diastolic Thickness (2D)                                0.7 cm       0.6-0.9 LVID Systole (2D)                   2.7 cm       2.2-3.5 LVOT Diameter                       1.9 cm               LV Mass Index (2D Cubed)          106  g/m2         43-95  Relative Wall Thickness (2D)                                  0.26        <=0.42 LV Function ---------------------------------------------------------------------- LV EF (4C MOD)                        56 %                LV Diastolic Volume Index (BP MOD)                        43.3 ml/m2     29.0-61.0 RV Dimensions 2D/MM  ----------------------------------------------------------------------  RV Basal Diastolic Dimension                           3.5 cm       2.5-4.1 TAPSE                               2.2 cm         >=1.7 Atria ---------------------------------------------------------------------- Name                                 Value        Normal ---------------------------------------------------------------------- LA Dimensions ---------------------------------------------------------------------- LA Dimension (2D)                   3.0 cm       2.7-3.8 LA Volume Index (2C A-L)        8.07 ml/m2               RA Dimensions ---------------------------------------------------------------------- RA Area (4C)                      15.4 cm2        <=18.0 RA Area (4C) Index              9.1 cm2/m2               RA ESV Index (4C MOD)             28 ml/m2         15-27 Left Ventricular Outflow Tract ---------------------------------------------------------------------- Name                                 Value        Normal ---------------------------------------------------------------------- LVOT 2D ---------------------------------------------------------------------- LVOT Diameter                       1.9 cm               LVOT Area                          2.8 cm2 Mitral Valve ---------------------------------------------------------------------- Name                                 Value        Normal ---------------------------------------------------------------------- MV Annular TDI ----------------------------------------------------------------------  MV Septal e' Velocity             6.7 cm/s         >=8.0 MV Lateral e' Velocity            9.0 cm/s        >=10.0 MV e' Average                     7.9 cm/s Aorta ---------------------------------------------------------------------- Name                                 Value        Normal ---------------------------------------------------------------------- Ascending Aorta  ---------------------------------------------------------------------- Ao Root Diameter (2D)               2.7 cm               Ao Root Diam Index (2D)          1.6 cm/m2               Ascending Aorta Diameter            2.7 cm Venous ---------------------------------------------------------------------- Name                                 Value        Normal ---------------------------------------------------------------------- IVC/SVC ---------------------------------------------------------------------- IVC Diameter (Insp 2D)              0.8 cm               IVC Diameter (Exp 2D)               2.0 cm         <=2.1  IVC Diameter Percent Change (2D)                                  60 %          >=50 Report Signatures Finalized by Concha Lonni An  MD on 05/14/2024 04:15 PM  ECG 12 Lead Result Date: 05/14/2024 ** Age and gender specific ECG analysis ** Sinus tachycardia Possible Left atrial enlargement Left ventricular hypertrophy ( R in aVL , Cornell product ) ST elevation, consider inferior injury or acute infarct Prolonged QT ** ** ACUTE MI / STEMI ** ** Consider right ventricular involvement in acute inferior infarct Abnormal ECG When compared with ECG of 14-May-2024 10:13, ST no longer depressed in Inferior leads  ECG 12 Lead Result Date: 05/14/2024 Sinus tachycardia Possible Left atrial enlargement Minimal voltage criteria for LVH, may be normal variant ( Cornell product ) Anterior infarct , age undetermined Marked ST abnormality, possible lateral subendocardial injury Prolonged QT Abnormal ECG When compared with ECG of 14-May-2024 07:53, ST less depressed in Anterior leads  ECG 12 Lead Result Date: 05/14/2024 Sinus tachycardia with short PR ST & Marked T wave abnormality, consider anterolateral ischemia Prolonged QT Abnormal ECG When compared with ECG of 13-May-2024 23:46, PR interval has decreased  ECG 12 Lead Result Date: 05/14/2024 Normal sinus rhythm Possible Left atrial  enlargement Left axis deviation ST & T wave abnormality, consider inferior ischemia ST & T wave abnormality, consider anterolateral ischemia Prolonged QT Abnormal ECG When compared with ECG of 05-Sep-2023 03:18, Significant changes have occurred Confirmed by Cherie Searle ([id number]) on 05/14/2024  12:34:38 AM  CT Abdomen Pelvis Wo Contrast Result Date: 05/13/2024 Exam:  CT of the abdomen and pelvis without contrast  History:  Nausea and vomiting     Technique:  Routine CT of the abdomen and pelvis without IV contrast.    AEC (automated exposure control) and/or manual techniques such as size-specific kV and mAs are employed where appropriate to reduce radiation exposure for all CT exams.  Comparison:  08/31/2023    Abdomen CT Findings:  LOWER THORAX:  Unremarkable.    HEPATOBILIARY:  Diminished attenuation throughout the liver consistent with diffuse steatosis. Cholecystectomy. Nondilated bile duct.    PANCREAS:  Normal.    SPLEEN:  Normal.    ADRENALS:  Normal.    KIDNEYS/URETERS:  Normal.     VASCULAR:  Nondilated aorta.     BOWEL/MESENTERY:  Large left anterior abdominal wall hernia contains nondilated segments of small bowel. Separate periumbilical hernia also contains nondilated small bowel segments. No evidence for incarceration. Chronic changes from previous partial sigmoid colectomy.      PELVIS:  The bladder is unremarkable. No pelvic free fluid.      BONES:  Unremarkable.         1.    Anterior abdominal wall hernias containing nondilated segments of bowel, no evidence for obstruction or incarceration. No acute inflammatory changes.  2.    Hepatic steatosis.  3.    Additional chronic changes as above.  Signed (Electronic Signature): 05/13/2024 10:18 PM Signed By: Emeline KATHEE Milroy, MD   Lab Results   Recent Labs    05/15/24 0531  WBC 4.9  HGB 8.5*  HCT 24.7*  PLT 140   Recent Labs    05/13/24 2139 05/14/24 0531 05/14/24 1513 05/15/24 0531  NA 137   < >  --  137  K 2.7*   < >  --  3.9  CL  95*   < >  --  101  CO2 20.1*   < >  --  25.9  BUN 11   < >  --  9  CREATININE 0.69   < >  --  0.59*  GLU 18*   < >  --  88  CALCIUM 8.5   < >  --  7.8*  ALBUMIN  3.3*   < >  --  2.8*  PROT 6.7   < >  --  5.7*  BILITOT 1.4*   < >  --  2.4*  AST 173*   < >  --  208*  ALT 47   < >  --  36  ALKPHOS 244*   < >  --  216*  MG 1.3*   < > 2.6  --   LIPASE 22  --   --   --    < > = values in this interval not displayed.   Recent Labs    05/14/24 2108  TROPONINI 66*   Recent Labs    05/14/24 0724  WBCUA 4*  NITRITE Negative  LEUKOCYTESUR Trace*  BACTERIA None Seen  RBCUA 1  BLOODU Negative  GLUCOSEU Negative  PROTEINUA 50 mg/dL*  KETONESU 60 mg/dL*   Recent Labs    89/71/74 2139 05/14/24 0816  OPIAU  --  Negative  BENZU  --  Negative  AMPHU  --  Negative  COCAU  --  Negative  CANNAU  --  Negative  BARBU  --  Positive*  ETOH 98*  --    No results for input(s): PREGTESTUR, PREGPOC in  the last 72 hours. No results for input(s): OCCULTBLD, RAPSCRN, CDIFRPCR, CDIFFNAP1, A1C, CHOL, LDL, HDL, TRIG in the last 72 hours. No results for input(s): O2SOUR, FIO2ART, PHART, PCO2ART, PO2ART, HCO3ART, O2SATART, BEART in the last 72 hours. Pending Labs     Order Current Status   Hepatitis Panel, Acute In process   Urine Culture In process       Joana JONETTA Hurst, PA Hospitalist, [facility] 05/15/24, 9:01 AM       [1]  Current Facility-Administered Medications:  .  acetaminophen  (TYLENOL ) tablet 650 mg, 650 mg, Oral, Q4H PRN, Hugelmeyer, Alexis, DO .  albuterol  2.5 mg /3 mL (0.083 %) nebulizer solution 2.5 mg, 2.5 mg, Nebulization, Q6H PRN, Hugelmeyer, Alexis, DO .  aluminum-magnesium  hydroxide-simethicone  (MAALOX MAX) 80-80-8 mg/mL oral suspension, 30 mL, Oral, Q4H PRN, Hugelmeyer, Alexis, DO .  bisacodyl  (DULCOLAX) EC tablet 10 mg, 10 mg, Oral, Daily PRN, Hugelmeyer, Alexis, DO .  dextrose  (GLUTOSE) 40 % gel 15 g of dextrose , 15 g of  dextrose , Oral, Q10 Min PRN, Hugelmeyer, Alexis, DO .  [Provider Hold] dextrose  10 % infusion, 125 mL/hr, Intravenous, Continuous, Gerome Rosaline Ruth, FNP, Stopped at 05/14/24 1308 .  dextrose  50 % in water  (D50W) 50 % solution 12.5 g, 12.5 g, Intravenous, Q15 Min PRN, Hugelmeyer, Alexis, DO .  dicyclomine (BENTYL) capsule 10 mg, 10 mg, Oral, Q6H PRN, Hugelmeyer, Alexis, DO .  glucagon injection 1 mg, 1 mg, Intramuscular, Once PRN, Hugelmeyer, Alexis, DO .  guaiFENesin (ROBITUSSIN) oral syrup, 200 mg, Oral, Q4H PRN, Hugelmeyer, Alexis, DO .  hydrOXYzine  (ATARAX ) tablet 25 mg, 25 mg, Oral, BID PRN, Hugelmeyer, Alexis, DO .  lactated ringers  bolus 1,000 mL, 1,000 mL, Intravenous, Once, Collins, Rosaline Ruth, FNP .  lactated Ringers  infusion, 100 mL/hr, Intravenous, Continuous, Hugelmeyer, Alexis, DO, Last Rate: 100 mL/hr at 05/15/24 0857, 100 mL/hr at 05/15/24 0857 .  melatonin tablet 3 mg, 3 mg, Oral, Nightly PRN, Hugelmeyer, Alexis, DO .  metoPROLOL  succinate (Toprol -XL) 24 hr tablet 12.5 mg, 12.5 mg, Oral, Daily, Hugelmeyer, Alexis, DO, 12.5 mg at 05/14/24 2216 .  naloxone (NARCAN) injection 0.1 mg, 0.1 mg, Intravenous, Q5 Min PRN, Hugelmeyer, Alexis, DO .  ondansetron  (ZOFRAN -ODT) disintegrating tablet 4 mg, 4 mg, Oral, Q8H PRN, 4 mg at 05/14/24 0738 **OR** ondansetron  (ZOFRAN -ODT) disintegrating tablet 8 mg, 8 mg, Oral, Q8H PRN, Hugelmeyer, Alexis, DO, 8 mg at 05/14/24 2102 .  polyethylene glycol (MIRALAX ) packet 17 g, 17 g, Oral, Daily PRN, Hugelmeyer, Alexis, DO .  potassium chloride  ER tablet 40 mEq, 40 mEq, Oral, BID, Panwala, Naitik Dhansukh, PA, 40 mEq at 05/15/24 0854 .  prochlorperazine  (COMPAZINE ) injection 2.5 mg, 2.5 mg, Intravenous, Q8H PRN, 2.5 mg at 05/15/24 0420 **OR** prochlorperazine  (COMPAZINE ) injection 5 mg, 5 mg, Intravenous, Q8H PRN, Hugelmeyer, Alexis, DO [2] Past Medical History: Diagnosis Date  . A-fib (CMS-HCC)   . Hypertension   [3] Past Surgical  History: Procedure Laterality Date  . CESAREAN SECTION  1993  . COLOSTOMY    . PR LAP,CHOLECYSTECTOMY Midline 09/03/2023   Procedure: LAPAROSCOPY, SURGICAL; CHOLECYSTECTOMY;  Surgeon: Rey Fonda PARAS, MD;  Location: OR [facility];  Service: Trauma  [4] Family History Problem Relation Age of Onset  . Breast cancer Mother 46  . COPD Mother   . Cancer Mother   . Hypertension Mother   . Atrial fibrillation Mother   . No Known Problems Father   . No Known Problems Son   . Breast cancer Maternal Aunt 48  2x

## 2024-05-15 NOTE — Progress Notes (Signed)
 ------------------------------------------------------------------------------- Attestation signed by Thom Earla Coy, MD at 05/15/24 [phone] Attending Physician Attestation:    I confirm that I interviewed and examined the patient, reviewed all the relevant lab and diagnostic data, and we agreed upon a plan of treatment. I agree with the findings, assessment, and plan as documented by Jodie Medley, NP.  44F PMHx AF/AFL not on AC, HTN, HLD p/w nausea, vomiting diarrhea. She denies CP or SOB. Troponin trended 130 -> 66. EKG showed sinus rhythm with significant anterolateral TWI new compared to Feb 2025. Echo showed EF >55% with apical and apical lateral hypokinesis. Findings suggest type 1 vs type 2 NSTEMI. Recommend medical management of NSTEMI with DAPT and high intensity statins with close follow up with primary cardiologist at [facility] for ischemic workup.  Coy Thom, MD, MPH  -------------------------------------------------------------------------------  Cardiology Progress Note   1.  EKG changes - Initial EKG NSR rate of 100 with leftward axis, prolonged QT and T wave inversion in 1 2 and V3 through V6 per hospitalist interpretation - Continue serial EKGs every 6 hours x 3.  Continue trending troponins. - Continue telemetry monitoring while inpatient. - Patient denies any anginal symptoms.  Does have abdominal pain with recent nausea, vomiting, diarrhea abdominal pain.  She is pending hernia surgery with Dr. Rubin - No active chest pain.  See repeat echo #2 below.   2.  Elevated troponins / NSTEMI - Hospitalist ordered trending troponins for above EKG changes. - Trending troponins 130 => 114 => 93 => 94 => 72.  Downtrending. - Denies any anginal symptoms.   - Continue trending troponins and EKGs - Patient having nausea, vomiting, diarrhea, abdominal pain times at least 4 days. - She is pending hernia surgery based on review of documentation.  See #5 below. - Recent echocardiogram at  [facility] health In December 2024 demonstrated normal EF 55 to 60% LV upper limit of normal.  Wall motion was normal trace tricuspid regurgitation - Repeat echocardiogram today demonstrates LV upper normal size with normal wall thickness, EF 60 to 65% cannot rule out hypokinesis of the poorly visualized LV apex.  Recommendation is to perform follow-up limited echo with contrast to assess for possible Takotsubo's.  - Repeat limited echo with contrast today demonstrates normal EF greater than 55% with decreased contractile function involving the apical and apical lateral segments. -Start loading dose of aspirin  325 mg p.o. once and loading dose of Plavix 300 mg p.o. once today -Begin daily aspirin  81 mg p.o. daily and Plavix 75 mg p.o. daily starting tomorrow -Start high intensity statin Lipitor 80 mg p.o. daily -She will need sooner follow-up with primary cardiologist at [facility] postdischarge     3.  History of atrial fibrillation - This appears to have been an isolated incident.  On no anticoagulation per recent cardiology note at [facility] heart care. - Recent ZIO monitor showed no evidence of atrial fibrillation or flutter or ventricular arrhythmias.  No high-grade AV block or pauses.  Less than 1% PAC burden less than 1% PVC burden.  Had 1 run of nonsustained V. tach lasting 4 beats.  Patient triggered events correlated with normal sinus rhythm 99 bpm - Will need to follow-up with her primary cardiologist post discharge.   4.  Alcohol use disorder - Prior history of documented alcohol abuse. - Denies any alcohol use today.   5.  Pending hernia surgery - As of 05/09/2024 she was pending hernia surgery with Dr. Lynda Rubin of [facility] surgery. - Recently  underwent preoperative cardiovascular risk assessment with [facility] via telemedicine visit - She was deemed to be at least moderate risk for complications without adequate follow-up on May 09, 2024     6.   Electrolyte abnormalities.  Hypokalemia, hypomagnesemia, - Potassium initially 2.7 on arrival.  Receiving potassium repletion by hospitalist service - Magnesium  1.2.  Receiving IV magnesium  by hospitalist service - Hospitalist service managing.     7.  Hypoglycemia. - Blood sugar significantly low on arrival.  Receiving D10 infusion - Blood sugars improving.  Currently 102 - Hospitalist service managing.     8.  Abdominal pain, nausea, vomiting, diarrhea, elevated LFTs. - RUQ ultrasound.  Enlarged liver with diffuse increased echogenicity is nonspecific but most commonly seen with hepatic steatosis.  Enlarged common bile duct within normal notes for reservoir effect post cholecystectomy - She is status post cholecystectomy and has had nausea, vomiting, diarrhea and abdominal pain since - She does have a history of alcoholic liver disease and peptic ulcer disease/GI bleed - Hemoglobin decreased.  10.2 yesterday to 8.5 today.  FOBT today - AST 208, ALP 216, lipase 22     Subjective:  Interval History: She is resting in bed this morning without any current complaints. She states her abdominal pain and diarrhea have significantly improved. She denies any chest pain or other anginal symptoms.  Limited echocardiogram with contrast done this morning is pending read.   Objective:  Vital signs in last 24 hours: Temp:  [36.8 C (98.2 F)-37 C (98.6 F)] 36.8 C (98.2 F) Pulse:  [86-129] 97 Resp:  [16-19] 16 BP: (99-116)/(64-87) 116/73 MAP (mmHg):  [79-88] 87 SpO2:  [97 %-100 %] 100 %  Intake/Output last 3 shifts: I/O last 3 completed shifts: In: 880 [P.O.:880] Out: 650 [Urine:650]  Physical Exam: Physical Exam Constitutional:      Appearance: Normal appearance.  HENT:     Head: Normocephalic.     Nose: Nose normal.  Eyes:     Pupils: Pupils are equal, round, and reactive to light.  Cardiovascular:     Rate and Rhythm: Normal rate and regular rhythm.     Pulses: Normal  pulses.     Heart sounds: Normal heart sounds.  Pulmonary:     Effort: Pulmonary effort is normal.     Breath sounds: Normal breath sounds.  Abdominal:     General: Abdomen is flat. Bowel sounds are normal.     Palpations: Abdomen is soft.  Musculoskeletal:        General: Normal range of motion.     Cervical back: Normal range of motion.  Skin:    General: Skin is warm and dry.     Capillary Refill: Capillary refill takes less than 2 seconds.  Neurological:     General: No focal deficit present.     Mental Status: She is oriented to person, place, and time.  Psychiatric:        Mood and Affect: Mood normal.        Behavior: Behavior normal.        Thought Content: Thought content normal.        Judgment: Judgment normal.

## 2024-05-16 ENCOUNTER — Encounter (HOSPITAL_COMMUNITY): Payer: Self-pay

## 2024-05-16 ENCOUNTER — Inpatient Hospital Stay (HOSPITAL_COMMUNITY)
Admission: RE | Admit: 2024-05-16 | Discharge: 2024-05-26 | DRG: 377 | Disposition: A | Discharge status: Home / Self Care | Attending: Internal Medicine | Admitting: Internal Medicine

## 2024-05-16 DIAGNOSIS — E876 Hypokalemia: Secondary | ICD-10-CM | POA: Diagnosis present

## 2024-05-16 DIAGNOSIS — K209 Esophagitis, unspecified without bleeding: Secondary | ICD-10-CM | POA: Diagnosis not present

## 2024-05-16 DIAGNOSIS — Z933 Colostomy status: Secondary | ICD-10-CM

## 2024-05-16 DIAGNOSIS — K760 Fatty (change of) liver, not elsewhere classified: Secondary | ICD-10-CM | POA: Diagnosis present

## 2024-05-16 DIAGNOSIS — K449 Diaphragmatic hernia without obstruction or gangrene: Secondary | ICD-10-CM | POA: Diagnosis present

## 2024-05-16 DIAGNOSIS — K222 Esophageal obstruction: Secondary | ICD-10-CM | POA: Diagnosis present

## 2024-05-16 DIAGNOSIS — K254 Chronic or unspecified gastric ulcer with hemorrhage: Secondary | ICD-10-CM | POA: Diagnosis present

## 2024-05-16 DIAGNOSIS — I10 Essential (primary) hypertension: Secondary | ICD-10-CM | POA: Diagnosis present

## 2024-05-16 DIAGNOSIS — E785 Hyperlipidemia, unspecified: Secondary | ICD-10-CM | POA: Diagnosis present

## 2024-05-16 DIAGNOSIS — Z6832 Body mass index (BMI) 32.0-32.9, adult: Secondary | ICD-10-CM | POA: Diagnosis not present

## 2024-05-16 DIAGNOSIS — K922 Gastrointestinal hemorrhage, unspecified: Principal | ICD-10-CM | POA: Diagnosis present

## 2024-05-16 DIAGNOSIS — K219 Gastro-esophageal reflux disease without esophagitis: Secondary | ICD-10-CM | POA: Diagnosis present

## 2024-05-16 DIAGNOSIS — R195 Other fecal abnormalities: Secondary | ICD-10-CM

## 2024-05-16 DIAGNOSIS — I509 Heart failure, unspecified: Secondary | ICD-10-CM | POA: Diagnosis not present

## 2024-05-16 DIAGNOSIS — I110 Hypertensive heart disease with heart failure: Secondary | ICD-10-CM | POA: Diagnosis not present

## 2024-05-16 DIAGNOSIS — K200 Eosinophilic esophagitis: Secondary | ICD-10-CM | POA: Diagnosis present

## 2024-05-16 DIAGNOSIS — E871 Hypo-osmolality and hyponatremia: Secondary | ICD-10-CM | POA: Diagnosis present

## 2024-05-16 DIAGNOSIS — F1090 Alcohol use, unspecified, uncomplicated: Secondary | ICD-10-CM | POA: Diagnosis not present

## 2024-05-16 DIAGNOSIS — I214 Non-ST elevation (NSTEMI) myocardial infarction: Secondary | ICD-10-CM | POA: Diagnosis present

## 2024-05-16 DIAGNOSIS — K250 Acute gastric ulcer with hemorrhage: Secondary | ICD-10-CM | POA: Diagnosis present

## 2024-05-16 DIAGNOSIS — K921 Melena: Secondary | ICD-10-CM | POA: Diagnosis not present

## 2024-05-16 DIAGNOSIS — D62 Acute posthemorrhagic anemia: Secondary | ICD-10-CM | POA: Diagnosis present

## 2024-05-16 DIAGNOSIS — K7030 Alcoholic cirrhosis of liver without ascites: Secondary | ICD-10-CM | POA: Diagnosis present

## 2024-05-16 DIAGNOSIS — Z87891 Personal history of nicotine dependence: Secondary | ICD-10-CM | POA: Diagnosis not present

## 2024-05-16 DIAGNOSIS — K2950 Unspecified chronic gastritis without bleeding: Secondary | ICD-10-CM | POA: Diagnosis not present

## 2024-05-16 DIAGNOSIS — K259 Gastric ulcer, unspecified as acute or chronic, without hemorrhage or perforation: Secondary | ICD-10-CM | POA: Diagnosis not present

## 2024-05-16 DIAGNOSIS — E66811 Obesity, class 1: Secondary | ICD-10-CM | POA: Diagnosis present

## 2024-05-16 DIAGNOSIS — K7010 Alcoholic hepatitis without ascites: Secondary | ICD-10-CM | POA: Diagnosis present

## 2024-05-16 DIAGNOSIS — R748 Abnormal levels of other serum enzymes: Secondary | ICD-10-CM | POA: Diagnosis not present

## 2024-05-16 LAB — GLUCOSE, CAPILLARY: Glucose-Capillary: 142 mg/dL — ABNORMAL HIGH (ref 70–99)

## 2024-05-16 MED ORDER — ONDANSETRON HCL 4 MG/2ML IJ SOLN
4.0000 mg | Freq: Four times a day (QID) | INTRAMUSCULAR | Status: DC | PRN
  Administered 2024-05-19 – 2024-05-20 (×2): 4 mg via INTRAVENOUS
  Filled 2024-05-16 (×2): qty 2

## 2024-05-16 MED ORDER — ACETAMINOPHEN 325 MG PO TABS
650.0000 mg | ORAL_TABLET | Freq: Four times a day (QID) | ORAL | Status: DC | PRN
  Administered 2024-05-18 – 2024-05-19 (×2): 650 mg via ORAL
  Filled 2024-05-16 (×3): qty 2

## 2024-05-16 MED ORDER — ONDANSETRON HCL 4 MG PO TABS
4.0000 mg | ORAL_TABLET | Freq: Four times a day (QID) | ORAL | Status: DC | PRN
Start: 2024-05-16 — End: 2024-05-26
  Administered 2024-05-17 – 2024-05-21 (×4): 4 mg via ORAL
  Filled 2024-05-16 (×4): qty 1

## 2024-05-16 MED ORDER — FAMOTIDINE IN NACL 20-0.9 MG/50ML-% IV SOLN
20.0000 mg | Freq: Two times a day (BID) | INTRAVENOUS | Status: DC
  Administered 2024-05-16: 20 mg via INTRAVENOUS
  Filled 2024-05-16 (×2): qty 50

## 2024-05-16 MED ORDER — HYDROCODONE-ACETAMINOPHEN 5-325 MG PO TABS
1.0000 | ORAL_TABLET | ORAL | Status: DC | PRN
  Administered 2024-05-16: 2 via ORAL
  Filled 2024-05-16: qty 2

## 2024-05-16 MED ORDER — ACETAMINOPHEN 650 MG RE SUPP: 650.0000 mg | Freq: Four times a day (QID) | RECTAL | Status: DC | PRN

## 2024-05-16 NOTE — Progress Notes (Signed)
 Cardiology Progress Note   1.  EKG changes - Initial EKG NSR rate of 100 with leftward axis, prolonged QT and T wave inversion in 1 2 and V3 through V6 per hospitalist interpretation - Continue serial EKGs every 6 hours x 3.  Continue trending troponins. - Continue telemetry monitoring while inpatient. - Patient denies any anginal symptoms.  Does have abdominal pain with recent nausea, vomiting, diarrhea abdominal pain.  She is pending hernia surgery with Dr. Rubin - No active chest pain.  See repeat echo #2 below.   2.  Elevated troponins / NSTEMI - Hospitalist ordered trending troponins for above EKG changes. - Trending troponins 130 => 114 => 93 => 94 => 72.  Downtrending. - Denies any anginal symptoms.   - Continue trending troponins and EKGs - Patient having nausea, vomiting, diarrhea, abdominal pain times at least 4 days PTA. - She is pending hernia surgery based on review of documentation.  See #5 below. - Recent echocardiogram at [facility] health In December 2024 demonstrated normal EF 55 to 60% LV upper limit of normal.  Wall motion was normal trace tricuspid regurgitation - Repeat echocardiogram yesterday: LV upper normal size with normal wall thickness, EF 60 to 65% cannot rule out hypokinesis of the poorly visualized LV apex.  Recommendation is to perform follow-up limited echo with contrast to assess for possible Takotsubo's.  - Repeat limited echo with contrast today demonstrates EF greater than 55% with decreased contractile function involving the apical and apical lateral segments. -Start loading dose of aspirin  325 mg p.o. once and loading dose of Plavix 300 mg p.o. once today - Begin daily aspirin  81 mg p.o. daily and Plavix 75 mg p.o. daily starting tomorrow -Start high intensity statin Lipitor 80 mg p.o. daily - ASA and Plavix were stopped earlier today due to GIB + FOBT yesterday - She will need sooner follow-up with primary cardiologist at [facility] postdischarge      3.  History of atrial fibrillation - This appears to have been an isolated incident.  On no anticoagulation per recent cardiology note at [facility] heart care. - Recent ZIO monitor showed no evidence of atrial fibrillation or flutter or ventricular arrhythmias.  No high-grade AV block or pauses.  Less than 1% PAC burden less than 1% PVC burden.  Had 1 run of nonsustained V. tach lasting 4 beats.  Patient triggered events correlated with normal sinus rhythm 99 bpm - Will need to follow-up with her primary cardiologist post discharge.   4.  Alcohol use disorder - Prior history of documented alcohol abuse. Alcohol level  98 on 110/28/2025 - Denies any alcohol use today.   5.  Pending hernia surgery - As of 05/09/2024 she was pending hernia surgery with Dr. Lynda Rubin of [facility] surgery. - Recently underwent preoperative cardiovascular risk assessment with [facility] via telemedicine visit - She was deemed to be at least moderate risk for complications without adequate follow-up on May 09, 2024     6.  Electrolyte abnormalities.  Hypokalemia, hypomagnesemia, - Potassium initially 2.7 on arrival,  4.7 today.  Received potassium repletion  - Magnesium  1.2 initially,  now 2.1.  Received IV magnesium   - Hospitalist service managing.     7.  Hypoglycemia. - Blood sugar significantly low on arrival.  Receiving D10 infusion - Blood sugars improving.  Currently 102 - Hospitalist service managing.     8.  Abdominal pain, nausea, vomiting, diarrhea, elevated LFTs / Hx of alcoholic liver  disease PUD / GIB - RUQ ultrasound.  Enlarged liver with diffuse increased echogenicity is nonspecific but most commonly seen with hepatic steatosis.  Enlarged common bile duct within normal notes for reservoir effect post cholecystectomy - She is status post cholecystectomy and has had nausea, vomiting, diarrhea and abdominal pain since - She does have a history of alcoholic liver  disease and peptic ulcer disease/GI bleed - Hemoglobin decreased.  8.5 yesterday now 7.1 . FOBT positive - AST 276 and ALP 180 today, lipase 22 on 05/13/2024    9.  Anemia/suspected GI bleed - Hemoglobin has been steadily dropping since admission.  Patient has a history of PAD and previous GI bleed.  Presenting with nausea, vomiting, diarrhea, abdominal pain. - Hemoglobin trending from 10.4 on admission today to 10.2 => 8.5 and now 7.1. - Hospitalist ordered Pepcid  IV, and 1 unit PRBCs.  FOBT positive - Hospitalist started PRBC transfusion this afternoon      Subjective:  Interval History: She is resting in bed this morning without any current complaints. This morning Hgb down from yesterday Hgb yesterday 8.5 now 7.1. FOBT was positive. Plan is to transfer to [facility] to Hospitalist service. Spoke to Cardiology Dr Darryle Decent and Dr Mario Blanch Hospitalist who has accepted the patient to med/tele[facility] at [facility]. Bed assignment is pending. Report given to Dr  Mario Blanch  Hospitalist via phone.    Objective:  Vital signs in last 24 hours: Temp:  [36.6 C (97.9 F)-37.3 C (99.1 F)] 36.6 C (97.9 F) Pulse:  [75-102] 93 Resp:  [17-20] 20 BP: (90-123)/(62-83) 123/83 MAP (mmHg):  [72-94] 94 SpO2:  [93 %-100 %] 100 %  Intake/Output last 3 shifts: I/O last 3 completed shifts: In: 1600 [P.O.:1600] Out: -   Physical Exam: Physical Exam Constitutional:      Appearance: Normal appearance.  HENT:     Head: Normocephalic.     Nose: Nose normal.  Eyes:     Pupils: Pupils are equal, round, and reactive to light.  Cardiovascular:     Rate and Rhythm: Normal rate and regular rhythm.     Pulses: Normal pulses.     Heart sounds: Normal heart sounds.  Pulmonary:     Effort: Pulmonary effort is normal.     Breath sounds: Normal breath sounds.  Abdominal:     General: Abdomen is flat. Bowel sounds are normal.     Palpations: Abdomen is soft.  Musculoskeletal:         General: Normal range of motion.     Cervical back: Normal range of motion.  Skin:    General: Skin is warm and dry.     Capillary Refill: Capillary refill takes less than 2 seconds.  Neurological:     General: No focal deficit present.     Mental Status: She is oriented to person, place, and time.  Psychiatric:        Mood and Affect: Mood normal.        Behavior: Behavior normal.        Thought Content: Thought content normal.        Judgment: Judgment normal.

## 2024-05-16 NOTE — H&P (Signed)
 History and Physical    Alyssa Carey FMW:[id number] DOB: [birth year] DOA: 05/16/2024  PCP: [name], MD   Chief Complaint: n/c/d  HPI: Alyssa Carey is a 53 y.o. female with medical history significant of alcoholic hepatitis, hyperlipidemia, hypertension, a flutter who presented with 6 days of nausea vomiting diarrhea.  Patient was at [facility] and numerous reported sick contacts.  She thought she had a gastrointestinal illness.  She presented to the ER where she was found to be afebrile and hemodynamically stable.  She was found to have hypokalemia, hypomagnesemia and elevated LFTs so she was admitted for further workup.  Her electrolytes were repleted.  At the outside hospital she was found to have elevated troponins with ST changes on EKG.  Cardiology was consulted and patient was recommended to start dual antiplatelet.  Patient was referred for outpatient follow-up however bloody stools and acute blood loss anemia was transferred for further management.  Labs at outside hospital notable for creatinine 0.49, AST 276, ALT 38, total bilirubin 2.4, AST 208, ALT 36, hemoglobin 8.5 down from 10.2, K 2.7.  Troponin was 130 on presentation downtrended to 114.  EKG is not available.  Patient explicitly denies CP or any evidence of blood in stool.    Review of Systems: Review of Systems  Constitutional:  Negative for chills and fever.  HENT: Negative.    Eyes: Negative.   Respiratory: Negative.    Cardiovascular: Negative.   Gastrointestinal:  Positive for abdominal pain, nausea and vomiting.  Genitourinary: Negative.   Musculoskeletal: Negative.   Skin: Negative.  Negative for rash.  Neurological: Negative.   Endo/Heme/Allergies: Negative.      As per HPI otherwise 10 point review of systems negative.   Allergies  Allergen Reactions   Pantoprazole      Stomach pain   Penicillins Hives   Oxycodone  Hcl Rash    Past Medical History:  Diagnosis Date   Alcohol abuse     HTN (hypertension) 10/05/2020   Necrotizing fasciitis [facility])     Past Surgical History:  Procedure Laterality Date   BIOPSY  10/07/2020   Procedure: BIOPSY;  Surgeon: Shaaron Lamar HERO, MD;  [facility];  Service: Endoscopy;;   BIOPSY  03/10/2023   Procedure: BIOPSY;  Surgeon: Cinderella Deatrice FALCON, MD;  [facility];  Service: Endoscopy;;   COLONOSCOPY WITH PROPOFOL  N/A 10/07/2020   single healing rectal ulcer in distal rectum with surrounding mucosal friability and markedly abnormal proximal colon with ulceration s/p biopsies.   COLONOSCOPY WITH PROPOFOL   02/22/2021   Procedure: COLONOSCOPY WITH PROPOFOL ;  Surgeon: Cindie Carlin POUR, DO;  [facility];  Service: Endoscopy;;   COLONOSCOPY WITH PROPOFOL  N/A 03/10/2023   Procedure: COLONOSCOPY WITH PROPOFOL ;  Surgeon: Cinderella Deatrice FALCON, MD;  [facility];  Service: Endoscopy;  Laterality: N/A;   COLOSTOMY TAKEDOWN N/A 01/26/2022   Procedure: LAPAROSCOPIC COLOSTOMY TAKEDOWN;  Surgeon: Debby Hila, MD;  Location: [facility];  Service: General;  Laterality: N/A;  laparoscopic assisted colostomy reversal and partial colectomy   ESOPHAGOGASTRODUODENOSCOPY N/A 01/04/2024   Procedure: EGD (ESOPHAGOGASTRODUODENOSCOPY);  Surgeon: Cinderella Deatrice FALCON, MD;  [facility];  Service: Endoscopy;  Laterality: N/A;  10:45AM;ASA 3   ESOPHAGOGASTRODUODENOSCOPY (EGD) WITH PROPOFOL  N/A 10/07/2020   non-bleeding gastric ulcer . Pathology with H.pylori negative   ESOPHAGOGASTRODUODENOSCOPY (EGD) WITH PROPOFOL  N/A 12/27/2020   Procedure: ESOPHAGOGASTRODUODENOSCOPY (EGD) WITH PROPOFOL ;  Surgeon: Cindie Carlin POUR, DO;  [facility];  Service:  Endoscopy;  Laterality: N/A;  1:00pm   ESOPHAGOGASTRODUODENOSCOPY (EGD) WITH PROPOFOL  N/A 01/17/2023   Procedure: ESOPHAGOGASTRODUODENOSCOPY (EGD) WITH PROPOFOL ;  Surgeon: Shaaron Lamar HERO, MD;  [facility];  Service: Endoscopy;  Laterality: N/A;    ESOPHAGOGASTRODUODENOSCOPY (EGD) WITH PROPOFOL  N/A 03/10/2023   Procedure: ESOPHAGOGASTRODUODENOSCOPY (EGD) WITH PROPOFOL ;  Surgeon: Cinderella Deatrice FALCON, MD;  [facility];  Service: Endoscopy;  Laterality: N/A;   FLEXIBLE SIGMOIDOSCOPY N/A 01/11/2020   Procedure: FLEXIBLE SIGMOIDOSCOPY;  Surgeon: Rollin Dover, MD;  [facility];  Service: Gastroenterology;  Laterality: N/A;   INCISION AND DRAINAGE PERIRECTAL ABSCESS N/A 12/25/2019   Procedure: IRRIGATION AND DEBRIDEMENT BUTTOCKS, LAP LOOP COLOSTOMY;  Surgeon: Tanda Locus, MD;  Location: [facility];  Service: General;  Laterality: N/A;   IRRIGATION AND DEBRIDEMENT ABSCESS N/A 12/23/2019   Procedure: EXCISION AND DEBRIDEMENT LEFT BUTTOCK AND PERINEUM;  Surgeon: Signe Mitzie LABOR, MD;  Location: [facility];  Service: General;  Laterality: N/A;   LAPAROSCOPIC LOOP COLOSTOMY N/A 12/25/2019   Procedure: LAPAROSCOPIC LOOP COLOSTOMY;  Surgeon: Tanda Locus, MD;  Location[facility];  Service: General;  Laterality: N/A;   POLYPECTOMY  03/10/2023   Procedure: POLYPECTOMY;  Surgeon: Cinderella Deatrice FALCON, MD;  [facility];  Service: Endoscopy;;   TONSILLECTOMY       reports that she has never smoked. She has never been exposed to tobacco smoke. She has never used smokeless tobacco. She reports that she does not currently use alcohol. She reports that she does not currently use drugs.  Family History  Problem Relation Age of Onset   Colon polyps Mother        does not believe adenomas   Arrhythmia Mother    Colon cancer Neg Hx     Prior to Admission medications   Medication Sig Start Date End Date Taking? Authorizing Provider  ascorbic acid  (VITAMIN C ) 500 MG tablet Take 500 mg by mouth daily as needed.    [provider]  cholestyramine  (QUESTRAN ) 4 g packet Take 1 packet (4 g total) by mouth daily as needed (diarrhea). 06/02/23   Pearlean Manus, MD  cyclobenzaprine (FLEXERIL) 10 MG tablet Take 10 mg by mouth every 6  (six) hours as needed. 10/26/23   [provider]  ferrous sulfate  325 (65 FE) MG EC tablet Take 1 tablet (325 mg total) by mouth daily with breakfast. 06/02/23   Pearlean Manus, MD  fluticasone  (FLONASE ) 50 MCG/ACT nasal spray Place 2 sprays into both nostrils as needed for allergies or rhinitis. 09/15/22   [provider]  folic acid  (FOLVITE ) 1 MG tablet Take 1 tablet (1 mg total) by mouth daily. 06/02/23   Pearlean Manus, MD  gabapentin  (NEURONTIN ) 100 MG capsule Take 1 capsule (100 mg total) by mouth at bedtime as needed (pain). 06/02/23   Pearlean Manus, MD  hydrocortisone  (ANUSOL -HC) 2.5 % rectal cream Place 1 Application rectally as needed for hemorrhoids or anal itching. 06/02/23   Pearlean Manus, MD  hydrOXYzine  (ATARAX ) 25 MG tablet Take 1 tablet (25 mg total) by mouth as needed for itching. 06/02/23   Pearlean Manus, MD  loperamide  (IMODIUM ) 2 MG capsule Take 2 mg by mouth 4 (four) times daily as needed. 10/26/23   [provider]  loratadine  (CLARITIN ) 10 MG tablet Take 1 tablet (10 mg total) by mouth daily. 11/14/22   Ricky Fines, MD  montelukast  (SINGULAIR ) 10 MG tablet Take 1 tablet (10 mg total) by mouth at bedtime. 06/02/23   Pearlean Manus, MD  Nystatin  (GERHARDT'S BUTT CREAM) CREA Apply 1 Application topically 4 (four) times daily as needed for irritation. 11/13/22   Ricky Fines, MD  pantoprazole  (PROTONIX ) 40 MG tablet Take 1 tablet (40 mg total) by mouth daily. 01/04/24 04/30/24  Ahmed, Muhammad F, MD  traZODone  (DESYREL ) 50 MG tablet Take 1 tablet (50 mg total) by mouth at bedtime as needed for sleep (insomnia). 06/02/23   Pearlean Manus, MD    Physical Exam: Vitals:   05/16/24 1938  BP: (!) 154/99  Weight: 73.3 kg  Height: 4' 9 (1.448 m)   Physical Exam Vitals reviewed.  Constitutional:      Appearance: She is normal weight.  HENT:     Head: Normocephalic.     Nose: Nose normal.     Mouth/Throat:     Mouth: Mucous  membranes are moist.     Pharynx: Oropharynx is clear.  Eyes:     Conjunctiva/sclera: Conjunctivae normal.     Pupils: Pupils are equal, round, and reactive to light.  Cardiovascular:     Rate and Rhythm: Normal rate and regular rhythm.     Pulses: Normal pulses.     Heart sounds: Normal heart sounds.  Pulmonary:     Effort: Pulmonary effort is normal.     Breath sounds: Normal breath sounds.  Abdominal:     General: Abdomen is flat. Bowel sounds are normal.  Musculoskeletal:        General: Normal range of motion.     Cervical back: Normal range of motion.  Skin:    General: Skin is warm.     Capillary Refill: Capillary refill takes less than 2 seconds.  Neurological:     General: No focal deficit present.     Mental Status: She is alert. Mental status is at baseline.  Psychiatric:        Mood and Affect: Mood normal.       Labs on Admission: I have personally reviewed the patients's labs and imaging studies.  Assessment/Plan Principal Problem:   GI bleed   #Nausea vomiting and diarrhea most concerning for gastroenteritis # Chronic anemia with concern for GI bleeding -Patient found to have consistent with viral GI illness-hemoglobin is baseline between 8 and 10 - Fecal occult was checked outside hospital found to be positive etiology of patient's anemia is likely chronic as patient is not having overt bleeding.  Patient maybe has esophagitis which is causing the positive fecal colon will need outpatient follow-up  Plan: Continue electrolyte placement Trend hemoglobin N.p.o. at midnight in case patient develops overt GI consultation Twice daily PPI  #Elevated Trop -no chest pain -reported EKG changes at outside hospital  PLAN: Recheck trop Check TTE Check EKG NTG for CP  #Elevated LFTs- ast elevated likely from alc hep. Encourage cessation    Admission status: Inpatient Med-Surg  Certification: The appropriate patient status for this patient is  INPATIENT. Inpatient status is judged to be reasonable and necessary in order to provide the required intensity of service to ensure the patient's safety. The patient's presenting symptoms, physical exam findings, and initial radiographic and laboratory data in the context of their chronic comorbidities is felt to place them at high risk for further clinical deterioration. Furthermore, it is not anticipated that the patient will be medically stable for discharge from the hospital within 2 midnights of admission.   * I certify that at the point of admission it is my clinical judgment that the patient will require inpatient hospital care spanning beyond  2 midnights from the point of admission due to high intensity of service, high risk for further deterioration and high frequency of surveillance required.DEWAINE Lamar Dess MD [facility] If 7PM-7AM, please contact night-coverage [email or web address]  05/16/2024, 9:27 PM

## 2024-05-16 NOTE — Progress Notes (Signed)
 Notified by nursing of drop in H/H and FOBT positive.  Added famotidine  IV due to pantoprazole  allergy and transfusing one unit.  Day team informed for follow up.

## 2024-05-16 NOTE — Care Plan (Signed)
 Shift Summary Pain management interventions led to a reduction in reported pain scores.  Nausea was treated with prochlorperazine  during the shift.  Fall prevention strategies were maintained, including bed alarms, low bed position, and frequent visual checks.  Infection prevention measures were consistently applied, and temperature and sepsis risk scores remained stable.  Overall, comfort and safety goals were supported throughout the shift.   Optimal Comfort and Wellbeing: Head and abdominal pain scores decreased from 7 to 4 after administration of HYDROcodone -acetaminophen  and hydrOXYzine , with rest noted as a relieving factor; acute pain persisted but was managed during the shift.   Absence of Fall and Fall-Related Injury: Fall reduction interventions such as low bed position, bed alarm, and hourly visual checks were consistently maintained throughout the shift, with no history of falls documented.   Absence of Infection Signs and Symptoms: Aseptic technique was maintained and rest/sleep was promoted at regular intervals; temperature remained within a narrow range and sepsis risk scores stayed low and stable.   Nausea and Vomiting Relief: Nausea was addressed with prochlorperazine  administered PRN during the shift.

## 2024-05-16 NOTE — Nursing Note (Signed)
   05/16/24 1131  Rapid Rounds  Attendance Nursing;Physician;Advanced Practice Provider (APP);Case Manager;Dietitian/Nutrition Services;Occupational Therapy;Pharmacy;Physical Therapy;Respiratory Therapy;Social Work/Services;Speech Language Pathology  Expected Discharge Disposition Other Fac  Today we still await: Clinical stability;Other (Comment) (transfer process)   Possible transfer to another facility for NSTEMI and GI bleed.

## 2024-05-16 NOTE — Discharge Summary (Signed)
 ------------------------------------------------------------------------------- Attestation signed by Marlee Lynwood Benders, MD at 05/16/24 1701 I have reviewed the documentation and discussed the case with PA Panwala and I agree with the plan as documented.  Lynwood KATHEE Marlee, MD 05/16/24 5:01 PM  -------------------------------------------------------------------------------   PAULLETTE SUMMARY [facility]   Discharge date:   May 16, 2024 Length of stay:    LOS: 2 days    Discharge Service:   [facility] Hospitalists Discharge Attending Physician: Joana Marice Hurst, PA Discharge to:    Transfer to Another Hospital Condition at Discharge:  good Code status:                         Full Code   Hospital Course: No notes on file  _______________________________________________________________   Admission HPI  Patient admitted on: 05/13/2024  6:46 PM  Patient admitted by: Thersia Roger, D.O.   CHIEF COMPLAINT:  N/V   Day of admission HPI:  Alyssa Carey  is a 53 y.o. female with a PMH significant for HLD, EtOH hepatitis, HTN, aflutter,  who presented with six days history of N/V/D.  She did have some coworkers who have similar symptoms.     Otherwise there has been no change in status. Patient has been taking medication as prescribed and there has been no recent change in medication or diet. There has been no recent illness/hospitalizations, travel or sick contacts.  Patient denies fevers/chills, weakness, dizziness, chest pain, shortness of breath, abdominal pain, dysuria/frequency, changes in mental status.  In the ED the patient received diphenhydramine , famotidine , ketorolac , LR, ondansetron , KCL, prochlorperazine .   Medical admission was requested for further workup and management of Significant electrolyte abnormalities including hypokalemia, hypomag, and hypoglycemia, elevated LFTs.      Patient admitted on Home O2? - No Patient on home  anticoagulant? -  No Patient admitted with Chronic home foley catheter? - No Foley catheter placed or replaced by another service prior to admission? - No Central Line Status: None   Mental Status on Admission: The patient IS Alert and oriented to PERSON The patient IS  Alert And oriented to TIME The patient IS Alert and oriented to LOCATION   Problem List, Assessment & Plan     ASSESSMENT & PLAN (In order of descending acuity)   53 y.o. female with a PMH significant for HLD, EtOH hepatitis, HTN, aflutter,  now admitted with:     Electrolyte abnormalities likely 2/2 gastroenteritis Patient is symptomatic with: N/V/D PE: Well appearing, MMM Labs: Mg 1.3, K 2.7, Glycose 12 Imaging reveals: CT abdomen negative for acute processes - Admit obs tele - Replace lytes as needed - Check BMP at 0200 - Check stool culture - Check EKG - Clear liquid diet and advance as tolerated - Aspiration precautions    Hypoglycemia - Continue dextrose  10% infusion - Hourly accuchecks   Abnormal EKG likely related to above.  No chest pain - EKG: NSR at 100 bpm with leftward axis, prolonged QT and TWI in I, II, V3-6 - Trend trops - Cardio consult in AM Elevated LFTs - Check hepatitis panel - Consider RUQ US  if needed given h/o EtOH hepatitis.      Admission status: Obs tele IV Fluids: LR Diet/Nutrition: Clear liquid Consults: PT DVT Px: SCDs and early ambulation Code Status: Full Code Disposition Plan: To home in <24 hours _______________________________________     May 16, 2024 =>  closely managed by cardiology for elevated troponins, which began to downtrend.  Initial  EKG showed ST depressions in leads I, II, and V5, followed by a subsequent EKG indicating STEMI.  Dr. Amelie recommended a repeat echocardiogram, which was completed yesterday morning;  Cardiology advised medical management for NSTEMI with dual antiplatelet therapy and high-intensity statin, with close follow-up for  ischemic workup.  Overnight, patient's H&H dropped and she had a positive FOBT. Overnight doctor started patient on IV famotidine  and transfused 1 unit of blood. Due to elevated troponin/NSTEMI, history of A-fib, alcohol use disorder, electrolyte abnormalities and hypoglycemia and history of alcoholic liver disease and chronic anemia with suspected GI bleed, cardiology team recommends patient be best served by transferring to tertiary care hospital.  Patient was accepted at [facility] at 2:15 PM today, October 31, by hospitalist Dr. Mario Blanch.   ______________________________    Mental Status On day of Discharge:  The patient is Alert and oriented to PERSON The patient is Alert And oriented to TIME The patient is Alert and oriented to LOCATION  CODE STATUS :                    Full Code   An advanced care planning discussion was  had with patient and/or patient's decisions maker (documented separately).  Patient discharged on Home O2? - no Patient discharged on home anticoagulant? -  no; not on anticoagulation per recent cardiology note from [facility] heart care  Foley Catheter status: None Central Line Status: NONE  Time Spent on Discharge I spent greater than 30 minutes counseling and coordinating care for the discharge of this patient. The patient and I discussed the importance of outpatient follow-up as well as concerning signs and symptoms that would require immediate evaluation by a medical professional. The aforementioned conversation participants understand  and did show insight. I did use teachback to ensure understanding. The above participant/s is aware that not following the discussed plan, recommendations, and follow up can lead to severe negative effects on the patient's health, up to and including death.  Discharge Medications     Your Medication List     START taking these medications    magnesium  oxide 400 mg (241.3 mg elemental) tablet Commonly  known as: MAG-OX Take 1 tablet (400 mg total) by mouth daily.   potassium chloride  10 mEq CR capsule Commonly known as: MICRO-K  Take 1 capsule (10 mEq total) by mouth two (2) times a day.   promethazine  25 MG tablet Commonly known as: PHENERGAN  Take 1 tablet (25 mg total) by mouth every eight (8) hours as needed for nausea for up to 7 days.       CONTINUE taking these medications    hydrOXYzine  25 MG tablet Commonly known as: ATARAX  Take 1 tablet (25 mg total) by mouth two (2) times a day as needed.       _____________________________________  Nutrition:                                        ___________________________________________  Discharge Instructions   Nutrition:                                   Activity:  Appointments:                          Follow Up:                                  Allergies  Allergen Reactions  . Oxycodone  Hcl Rash and Other (See Comments)  . Penicillins Hives    Did it involve swelling of the face/tongue/throat, SOB, or low BP? N  Did it involve sudden or severe rash/hives, skin peeling, or any reaction on the inside of your mouth or nose? Y  Did you need to seek medical attention at a hospital or doctor's office? N  When did it last happen?  Childhood      If all above answers are "NO", may proceed with cephalosporin use.  Did it involve swelling of the face/tongue/throat, SOB, or low BP? N  Did it involve sudden or severe rash/hives, skin peeling, or any reaction on the inside of your mouth or nose? Y  Did you need to seek medical attention at a hospital or doctor's office? N  When did it last happen?  Childhood      If all above answers are NO, may proceed with cephalosporin use.  Did it involve swelling of the face/tongue/throat, SOB, or low BP? N  Did it involve sudden or severe rash/hives, skin peeling, or any reaction on the inside of your mouth or nose? Y  Did you need to seek  medical attention at a hospital or doctor's office? N  When did it last happen?  Childhood      If all above answers are "NO", may proceed with cephalosporin use.  . Pantoprazole  Other (See Comments)    Stomach pain  Pt states she is allergic to the medication has a prescription for it     Past Medical History[1]  Past Surgical History[2]   Family History[3]   Current Medications[4]  Imaging  ECG 12 Lead Result Date: 05/16/2024 Sinus tachycardia Minimal voltage criteria for LVH, may be normal variant ( R in aVL ) Marked ST abnormality, possible lateral subendocardial injury Prolonged QT Abnormal ECG When compared with ECG of 14-May-2024 14:03, No significant change was found Confirmed by Sheng, Siyuan [phone]) on 05/16/2024 12:28:25 PM  ECG 12 Lead Result Date: 05/16/2024 Sinus tachycardia Left ventricular hypertrophy ( R in aVL , Cornell product ) Marked ST abnormality, possible lateral subendocardial injury Prolonged QT Abnormal ECG When compared with ECG of 14-May-2024 10:13, ST no longer depressed in Inferior leads Confirmed by Sheng, Siyuan [phone]) on 05/16/2024 12:28:15 PM  ECG 12 Lead Result Date: 05/16/2024 Sinus tachycardia Minimal voltage criteria for LVH, may be normal variant ( Cornell product ) Marked ST abnormality, possible lateral subendocardial injury Prolonged QT Abnormal ECG When compared with ECG of 14-May-2024 07:53, ST less depressed in Anterior leads Confirmed by Sheng, Siyuan [phone]) on 05/16/2024 12:26:49 PM  ECG 12 Lead Result Date: 05/16/2024 Sinus tachycardia with short PR ST & Marked T wave abnormality, consider anterolateral ischemia Prolonged QT Abnormal ECG When compared with ECG of 13-May-2024 23:46, PR interval has decreased Confirmed by Sheng, Siyuan [phone]) on 05/16/2024 12:26:22 PM  Echocardiogram Follow Up/Limited Echo Result Date: 05/15/2024 Patient Info Name:     Alyssa Carey Age:     53 years DOB:     1971-06-27 Gender:     Female MRN:  [id number] Accession #:     [id number] [facility] Account #:     [id number] BP:     116 /     73 mmHg Exam Date:     05/15/2024 7:06 AM Admit Date:     05/13/2024 Exam Type:     ECHOCARDIOGRAM FOLLOW UP/LIMITED ECHO Technical Quality:     Good Staff Sonographer:     Mliss Gate Supervising Physician:     Earla Maude Currier MD Ordering Physician:     Prentice Jama Medley Sonographer:     [place] Study Info Indications      - follow up of todays echo for suspicion of Takotsubo's cardiomyopathy Procedure(s)   Limited 2D transthoracic echocardiogram is performed. Summary   1. The left ventricular systolic function is normal, LVEF is visually estimated at > 55%.   2. There is decreased contractile function involving the apical and apical lateral segment(s). Left Ventricle   The left ventricular systolic function is normal, LVEF is visually estimated at > 55%. There is decreased contractile function involving the apical and apical lateral segment(s). Mitral Valve   The mitral valve leaflets are normal with normal leaflet mobility. Report Signatures Finalized by Earla Maude Currier  MD on 05/15/2024 12:10 PM  Echocardiogram Follow Up/Limited Echo Result Date: 05/14/2024 Patient Info Name:     Alyssa Carey Age:     53 years DOB:     1971/07/01 Gender:     Female MRN:     [id number] Accession #:     [id number] [facility] Account #:     [id number] Ht:     145 cm Wt:     69 kg BSA:     1.69 m2 BP:     80 /     49 mmHg HR:     110 bpm Exam Date:     05/14/2024 1:51 PM Admit Date:     05/13/2024 Exam Type:     ECHOCARDIOGRAM FOLLOW UP/LIMITED ECHO Technical Quality:     Good Staff Sonographer:     Mliss Gate Supervising Physician:     Concha Lonni An MD Ordering Physician:     Prentice Jama Medley Study Info Indications      - abnormal ekg ,  elevated troponins Procedure(s)   Limited 2D transthoracic echocardiogram is performed. Summary   1. The left ventricle is upper normal in size with normal wall thickness.    2. The left ventricular systolic function is normal, LVEF is visually estimated at 60-65%.   3. Cannot rule out hypokinesis of the poorly visualized LV apex.   4. The right ventricle is normal in size, with normal systolic function. Left Ventricle   The left ventricle is upper normal in size with normal wall thickness. The left ventricular systolic function is normal, LVEF is visually estimated at 60-65%. Cannot rule out hypokinesis of the poorly visualized LV apex. Right Ventricle   The right ventricle is normal in size, with normal systolic function. Left Atrium   The left atrium is normal in size. Right Atrium   The right atrium is normal in size. Aortic Valve   The aortic valve is trileaflet with normal appearing leaflets with normal excursion. Mitral Valve   The mitral valve leaflets are normal with normal leaflet mobility. Tricuspid Valve   The tricuspid valve is not well visualized. Pulmonic Valve   Pulmonary valve is not well visualized. Aorta   The aorta is normal in size in the visualized segments. Inferior Vena Cava  The IVC is not well visualized precluding the ability to accurate assess right atrial pressure. Pericardium/Pleural   There is no pericardial effusion. Other Findings   Rhythm: Tachycardia. Ventricles ---------------------------------------------------------------------- Name                                 Value        Normal ---------------------------------------------------------------------- LV Dimensions 2D/MM ----------------------------------------------------------------------  IVS Diastolic Thickness (2D)                                1.1 cm       0.6-0.9 LVID Diastole (2D)                  5.4 cm       3.8-5.2  LVPW Diastolic Thickness (2D)                                0.7 cm       0.6-0.9 LVID Systole (2D)                   2.7 cm       2.2-3.5 LVOT Diameter                       1.9 cm               LV Mass Index (2D Cubed)          106 g/m2         43-95  Relative Wall  Thickness (2D)                                  0.26        <=0.42 LV Function ---------------------------------------------------------------------- LV EF (4C MOD)                        56 %                LV Diastolic Volume Index (BP MOD)                        43.3 ml/m2     29.0-61.0 RV Dimensions 2D/MM ----------------------------------------------------------------------  RV Basal Diastolic Dimension                           3.5 cm       2.5-4.1 TAPSE                               2.2 cm         >=1.7 Atria ---------------------------------------------------------------------- Name                                 Value        Normal ---------------------------------------------------------------------- LA Dimensions ---------------------------------------------------------------------- LA Dimension (2D)                   3.0 cm       2.7-3.8 LA Volume Index (2C A-L)        8.07  ml/m2               RA Dimensions ---------------------------------------------------------------------- RA Area (4C)                      15.4 cm2        <=18.0 RA Area (4C) Index              9.1 cm2/m2               RA ESV Index (4C MOD)             28 ml/m2         15-27 Left Ventricular Outflow Tract ---------------------------------------------------------------------- Name                                 Value        Normal ---------------------------------------------------------------------- LVOT 2D ---------------------------------------------------------------------- LVOT Diameter                       1.9 cm               LVOT Area                          2.8 cm2 Mitral Valve ---------------------------------------------------------------------- Name                                 Value        Normal ---------------------------------------------------------------------- MV Annular TDI ---------------------------------------------------------------------- MV Septal e' Velocity             6.7 cm/s         >=8.0 MV  Lateral e' Velocity            9.0 cm/s        >=10.0 MV e' Average                     7.9 cm/s Aorta ---------------------------------------------------------------------- Name                                 Value        Normal ---------------------------------------------------------------------- Ascending Aorta ---------------------------------------------------------------------- Ao Root Diameter (2D)               2.7 cm               Ao Root Diam Index (2D)          1.6 cm/m2               Ascending Aorta Diameter            2.7 cm Venous ---------------------------------------------------------------------- Name                                 Value        Normal ---------------------------------------------------------------------- IVC/SVC ---------------------------------------------------------------------- IVC Diameter (Insp 2D)              0.8 cm               IVC Diameter (Exp 2D)               2.0 cm         <=2.1  IVC  Diameter Percent Change (2D)                                  60 %          >=50 Report Signatures Finalized by Concha Lonni An  MD on 05/14/2024 04:15 PM  ECG 12 Lead Result Date: 05/14/2024 Normal sinus rhythm Possible Left atrial enlargement Left axis deviation ST & T wave abnormality, consider inferior ischemia ST & T wave abnormality, consider anterolateral ischemia Prolonged QT Abnormal ECG When compared with ECG of 05-Sep-2023 03:18, Significant changes have occurred Confirmed by Cherie Searle ([id number]) on 05/14/2024 12:34:38 AM  CT Abdomen Pelvis Wo Contrast Result Date: 05/13/2024 Exam:  CT of the abdomen and pelvis without contrast  History:  Nausea and vomiting     Technique:  Routine CT of the abdomen and pelvis without IV contrast.    AEC (automated exposure control) and/or manual techniques such as size-specific kV and mAs are employed where appropriate to reduce radiation exposure for all CT exams.  Comparison:  08/31/2023    Abdomen CT Findings:   LOWER THORAX:  Unremarkable.    HEPATOBILIARY:  Diminished attenuation throughout the liver consistent with diffuse steatosis. Cholecystectomy. Nondilated bile duct.    PANCREAS:  Normal.    SPLEEN:  Normal.    ADRENALS:  Normal.    KIDNEYS/URETERS:  Normal.     VASCULAR:  Nondilated aorta.     BOWEL/MESENTERY:  Large left anterior abdominal wall hernia contains nondilated segments of small bowel. Separate periumbilical hernia also contains nondilated small bowel segments. No evidence for incarceration. Chronic changes from previous partial sigmoid colectomy.      PELVIS:  The bladder is unremarkable. No pelvic free fluid.      BONES:  Unremarkable.         1.    Anterior abdominal wall hernias containing nondilated segments of bowel, no evidence for obstruction or incarceration. No acute inflammatory changes.  2.    Hepatic steatosis.  3.    Additional chronic changes as above.  Signed (Electronic Signature): 05/13/2024 10:18 PM Signed By: Emeline KATHEE Milroy, MD   Lab Results   Recent Labs    05/16/24 0540  WBC 4.1  HGB 7.1*  HCT 20.7*  PLT 111*   Recent Labs    05/13/24 2139 05/14/24 0531 05/14/24 1513 05/15/24 0531 05/16/24 0540  NA 137   < >  --    < > 136  K 2.7*   < >  --    < > 4.7  CL 95*   < >  --    < > 103  CO2 20.1*   < >  --    < > 25.8  BUN 11   < >  --    < > 8  CREATININE 0.69   < >  --    < > 0.49*  GLU 18*   < >  --    < > 69*  CALCIUM 8.5   < >  --    < > 7.7*  ALBUMIN  3.3*   < >  --    < > 2.4*  PROT 6.7   < >  --    < > 4.8*  BILITOT 1.4*   < >  --    < > 1.4*  AST 173*   < >  --    < > 276*  ALT  47   < >  --    < > 38  ALKPHOS 244*   < >  --    < > 180*  MG 1.3*   < > 2.6  --   --   LIPASE 22  --   --   --   --    < > = values in this interval not displayed.   Recent Labs    05/14/24 2108  TROPONINI 66*   Recent Labs    05/14/24 0724  WBCUA 4*  NITRITE Negative  LEUKOCYTESUR Trace*  BACTERIA None Seen  RBCUA 1  BLOODU Negative  GLUCOSEU Negative   PROTEINUA 50 mg/dL*  KETONESU 60 mg/dL*   Recent Labs    89/71/74 2139 05/14/24 0816  OPIAU  --  Negative  BENZU  --  Negative  AMPHU  --  Negative  COCAU  --  Negative  CANNAU  --  Negative  BARBU  --  Positive*  ETOH 98*  --    No results for input(s): PREGTESTUR, PREGPOC in the last 72 hours. No results for input(s): OCCULTBLD, RAPSCRN, CDIFRPCR, CDIFFNAP1, A1C, CHOL, LDL, HDL, TRIG in the last 72 hours. No results for input(s): O2SOUR, FIO2ART, PHART, PCO2ART, PO2ART, HCO3ART, O2SATART, BEART in the last 72 hours. Pending Labs     Order Current Status   Campylobacter Antigen Assay In process   Guaiac Fecal Occult Blood Test (FOBT) x 3 In process   LAB ADD ON ONLY - SHIGATOXIN SCREEN In process   Stool Culture In process       Home Medications   Prior to Admission medications  Medication Dose, Route, Frequency  hydrOXYzine  (ATARAX ) 25 MG tablet 25 mg, Oral, 2 times a day PRN  magnesium  oxide (MAG-OX) 400 mg (241.3 mg elemental magnesium ) tablet 400 mg, Oral, Daily (standard)  potassium chloride  (MICRO-K ) 10 mEq CR capsule 10 mEq, Oral, 2 times a day (standard)  promethazine  (PHENERGAN ) 25 MG tablet 25 mg, Oral, Every 8 hours PRN   Joana JONETTA Hurst, PA Hospitalist, [facility] 05/16/24, 4:50 PM      [1] Past Medical History: Diagnosis Date  . A-fib (CMS-HCC)   . Hypertension   [2] Past Surgical History: Procedure Laterality Date  . CESAREAN SECTION  1993  . COLOSTOMY    . PR LAP,CHOLECYSTECTOMY Midline 09/03/2023   Procedure: LAPAROSCOPY, SURGICAL; CHOLECYSTECTOMY;  Surgeon: Rey Fonda PARAS, MD;  Location: OR [facility];  Service: Trauma  [3] Family History Problem Relation Age of Onset  . Breast cancer Mother 86  . COPD Mother   . Cancer Mother   . Hypertension Mother   . Atrial fibrillation Mother   . No Known Problems Father   . No Known Problems Son   . Breast cancer Maternal Aunt 53       2x  [4]  Current  Facility-Administered Medications:  .  acetaminophen  (TYLENOL ) tablet 650 mg, 650 mg, Oral, Q4H PRN, Hugelmeyer, Alexis, DO .  albuterol  2.5 mg /3 mL (0.083 %) nebulizer solution 2.5 mg, 2.5 mg, Nebulization, Q6H PRN, Hugelmeyer, Alexis, DO .  aluminum-magnesium  hydroxide-simethicone  (MAALOX MAX) 80-80-8 mg/mL oral suspension, 30 mL, Oral, Q4H PRN, Hugelmeyer, Alexis, DO .  [Provider Hold] aspirin  chewable tablet 81 mg, 81 mg, Oral, Daily, Richarda Prentice Ruth, FNP .  atorvastatin (LIPITOR) tablet 80 mg, 80 mg, Oral, Daily, Quinn, Andrew Lee, FNP, 80 mg at 05/16/24 0859 .  bisacodyl  (DULCOLAX) EC tablet 10 mg, 10 mg, Oral, Daily PRN, Hugelmeyer, Alexis, DO .  [  Provider Hold] clopidogrel (PLAVIX) tablet 75 mg, 75 mg, Oral, Daily, Richarda Prentice Ruth, FNP .  dextrose  (GLUTOSE) 40 % gel 15 g of dextrose , 15 g of dextrose , Oral, Q10 Min PRN, Hugelmeyer, Alexis, DO .  [Provider Hold] dextrose  10 % infusion, 125 mL/hr, Intravenous, Continuous, Gerome Rosaline Ruth, FNP, Stopped at 05/14/24 1308 .  dextrose  50 % in water  (D50W) 50 % solution 12.5 g, 12.5 g, Intravenous, Q15 Min PRN, Hugelmeyer, Alexis, DO .  dicyclomine (BENTYL) capsule 10 mg, 10 mg, Oral, Q6H PRN, Hugelmeyer, Alexis, DO .  famotidine  (PF) (PEPCID ) injection 20 mg, 20 mg, Intravenous, BID, Hugelmeyer, Alexis, DO, 20 mg at 05/16/24 0858 .  glucagon injection 1 mg, 1 mg, Intramuscular, Once PRN, Hugelmeyer, Alexis, DO .  guaiFENesin (ROBITUSSIN) oral syrup, 200 mg, Oral, Q4H PRN, Hugelmeyer, Alexis, DO .  HYDROcodone -acetaminophen  (NORCO) 5-325 mg per tablet 1 tablet, 1 tablet, Oral, Q6H PRN, Panwala, Naitik Dhansukh, PA, 1 tablet at 05/15/24 2052 .  hydrOXYzine  (ATARAX ) tablet 25 mg, 25 mg, Oral, BID PRN, Hugelmeyer, Alexis, DO, 25 mg at 05/15/24 2052 .  lactated ringers  bolus 1,000 mL, 1,000 mL, Intravenous, Once, Collins, Rosaline Ruth, FNP .  lactated Ringers  infusion, 100 mL/hr, Intravenous, Continuous, Hugelmeyer, Alexis, DO, Last Rate:  100 mL/hr at 05/16/24 0512, 100 mL/hr at 05/16/24 0512 .  melatonin tablet 3 mg, 3 mg, Oral, Nightly PRN, Hugelmeyer, Alexis, DO .  metoPROLOL  succinate (Toprol -XL) 24 hr tablet 12.5 mg, 12.5 mg, Oral, Daily, Hugelmeyer, Alexis, DO, 12.5 mg at 05/16/24 0858 .  naloxone (NARCAN) injection 0.1 mg, 0.1 mg, Intravenous, Q5 Min PRN, Hugelmeyer, Alexis, DO .  ondansetron  (ZOFRAN -ODT) disintegrating tablet 4 mg, 4 mg, Oral, Q8H PRN, 4 mg at 05/16/24 1154 **OR** ondansetron  (ZOFRAN -ODT) disintegrating tablet 8 mg, 8 mg, Oral, Q8H PRN, Hugelmeyer, Alexis, DO, 8 mg at 05/15/24 0907 .  polyethylene glycol (MIRALAX ) packet 17 g, 17 g, Oral, Daily PRN, Hugelmeyer, Alexis, DO .  prochlorperazine  (COMPAZINE ) injection 2.5 mg, 2.5 mg, Intravenous, Q8H PRN, 2.5 mg at 05/15/24 0420 **OR** prochlorperazine  (COMPAZINE ) injection 5 mg, 5 mg, Intravenous, Q8H PRN, Hugelmeyer, Alexis, DO, 5 mg at 05/16/24 0036

## 2024-05-17 ENCOUNTER — Encounter (HOSPITAL_COMMUNITY): Payer: Self-pay | Admitting: Internal Medicine

## 2024-05-17 ENCOUNTER — Inpatient Hospital Stay (HOSPITAL_COMMUNITY): Payer: Self-pay

## 2024-05-17 ENCOUNTER — Other Ambulatory Visit: Payer: Self-pay

## 2024-05-17 DIAGNOSIS — R195 Other fecal abnormalities: Secondary | ICD-10-CM | POA: Diagnosis not present

## 2024-05-17 DIAGNOSIS — D62 Acute posthemorrhagic anemia: Secondary | ICD-10-CM

## 2024-05-17 DIAGNOSIS — K7030 Alcoholic cirrhosis of liver without ascites: Secondary | ICD-10-CM | POA: Diagnosis not present

## 2024-05-17 DIAGNOSIS — K7010 Alcoholic hepatitis without ascites: Secondary | ICD-10-CM

## 2024-05-17 DIAGNOSIS — R079 Chest pain, unspecified: Secondary | ICD-10-CM

## 2024-05-17 DIAGNOSIS — F1090 Alcohol use, unspecified, uncomplicated: Secondary | ICD-10-CM

## 2024-05-17 DIAGNOSIS — R748 Abnormal levels of other serum enzymes: Secondary | ICD-10-CM

## 2024-05-17 LAB — CBC WITH DIFFERENTIAL/PLATELET
Abs Immature Granulocytes: 0.02 K/uL (ref 0.00–0.07)
Basophils Absolute: 0 K/uL (ref 0.0–0.1)
Basophils Relative: 0 %
Eosinophils Absolute: 0 K/uL (ref 0.0–0.5)
Eosinophils Relative: 0 %
HCT: 26.4 % — ABNORMAL LOW (ref 36.0–46.0)
Hemoglobin: 8.8 g/dL — ABNORMAL LOW (ref 12.0–15.0)
Immature Granulocytes: 1 %
Lymphocytes Relative: 20 %
Lymphs Abs: 0.8 K/uL (ref 0.7–4.0)
MCH: 31 pg (ref 26.0–34.0)
MCHC: 33.3 g/dL (ref 30.0–36.0)
MCV: 93 fL (ref 80.0–100.0)
Monocytes Absolute: 0.2 K/uL (ref 0.1–1.0)
Monocytes Relative: 6 %
Neutro Abs: 2.7 K/uL (ref 1.7–7.7)
Neutrophils Relative %: 73 %
Platelets: 138 K/uL — ABNORMAL LOW (ref 150–400)
RBC: 2.84 MIL/uL — ABNORMAL LOW (ref 3.87–5.11)
RDW: 19.4 % — ABNORMAL HIGH (ref 11.5–15.5)
WBC: 3.7 K/uL — ABNORMAL LOW (ref 4.0–10.5)
nRBC: 0 % (ref 0.0–0.2)

## 2024-05-17 LAB — BASIC METABOLIC PANEL WITH GFR
Anion gap: 12 (ref 5–15)
BUN: 6 mg/dL (ref 6–20)
CO2: 19 mmol/L — ABNORMAL LOW (ref 22–32)
Calcium: 8.3 mg/dL — ABNORMAL LOW (ref 8.9–10.3)
Chloride: 100 mmol/L (ref 98–111)
Creatinine, Ser: 0.59 mg/dL (ref 0.44–1.00)
GFR, Estimated: >60 mL/min (ref >60)
Glucose, Bld: 132 mg/dL — ABNORMAL HIGH (ref 70–99)
Potassium: 4.7 mmol/L (ref 3.5–5.1)
Sodium: 131 mmol/L — ABNORMAL LOW (ref 135–145)

## 2024-05-17 LAB — ECHOCARDIOGRAM COMPLETE
Area-P 1/2: 5.2 cm2
Height: 59 in
S' Lateral: 3.3 cm
Weight: 2585.55 [oz_av]

## 2024-05-17 LAB — IRON AND TIBC
Iron: 256 ug/dL — ABNORMAL HIGH (ref 28–170)
Saturation Ratios: 96 % — ABNORMAL HIGH (ref 10.4–31.8)
TIBC: 266 ug/dL (ref 250–450)
UIBC: 10 ug/dL

## 2024-05-17 LAB — FERRITIN: Ferritin: 511 ng/mL — ABNORMAL HIGH (ref 11–307)

## 2024-05-17 LAB — RETICULOCYTES
Immature Retic Fract: 16.6 % — ABNORMAL HIGH (ref 2.3–15.9)
RBC.: 3.56 MIL/uL — ABNORMAL LOW (ref 3.87–5.11)
Retic Count, Absolute: 53.8 K/uL (ref 19.0–186.0)
Retic Ct Pct: 1.5 % (ref 0.4–3.1)

## 2024-05-17 LAB — FOLATE: Folate: 3.3 ng/mL — ABNORMAL LOW (ref >5.9)

## 2024-05-17 LAB — PROTIME-INR
INR: 1 (ref 0.8–1.2)
Prothrombin Time: 13.4 s (ref 11.4–15.2)

## 2024-05-17 LAB — TROPONIN I (HIGH SENSITIVITY): Troponin I (High Sensitivity): 13 ng/L (ref <18)

## 2024-05-17 LAB — MAGNESIUM: Magnesium: 1.4 mg/dL — ABNORMAL LOW (ref 1.7–2.4)

## 2024-05-17 LAB — TYPE AND SCREEN
ABO/RH(D): A POS
Antibody Screen: NEGATIVE

## 2024-05-17 LAB — MRSA NEXT GEN BY PCR, NASAL: MRSA by PCR Next Gen: NOT DETECTED

## 2024-05-17 LAB — VITAMIN B12: Vitamin B-12: 326 pg/mL (ref 180–914)

## 2024-05-17 MED ORDER — CARVEDILOL 3.125 MG PO TABS
3.1250 mg | ORAL_TABLET | Freq: Two times a day (BID) | ORAL | Status: DC
  Administered 2024-05-17 – 2024-05-18 (×4): 3.125 mg via ORAL
  Filled 2024-05-17 (×4): qty 1

## 2024-05-17 MED ORDER — ENSURE PLUS HIGH PROTEIN PO LIQD
237.0000 mL | Freq: Two times a day (BID) | ORAL | Status: DC
  Administered 2024-05-18 – 2024-05-25 (×12): 237 mL via ORAL

## 2024-05-17 MED ORDER — MAGNESIUM SULFATE 4 GM/100ML IV SOLN
4.0000 g | Freq: Once | INTRAVENOUS | Status: AC
  Administered 2024-05-17: 4 g via INTRAVENOUS
  Filled 2024-05-17: qty 100

## 2024-05-17 MED ORDER — FOLIC ACID 1 MG PO TABS
1.0000 mg | ORAL_TABLET | Freq: Every day | ORAL | Status: DC
  Administered 2024-05-17 – 2024-05-25 (×9): 1 mg via ORAL
  Filled 2024-05-17 (×9): qty 1

## 2024-05-17 MED ORDER — HYDROCORTISONE (PERIANAL) 2.5 % EX CREA: 1.0000 | TOPICAL_CREAM | CUTANEOUS | Status: DC | PRN

## 2024-05-17 MED ORDER — FAMOTIDINE 20 MG PO TABS
20.0000 mg | ORAL_TABLET | Freq: Every day | ORAL | Status: DC
  Administered 2024-05-17 – 2024-05-19 (×3): 20 mg via ORAL
  Filled 2024-05-17 (×3): qty 1

## 2024-05-17 MED ORDER — FERROUS SULFATE 325 (65 FE) MG PO TABS
325.0000 mg | ORAL_TABLET | Freq: Every day | ORAL | Status: DC
  Administered 2024-05-18 – 2024-05-25 (×8): 325 mg via ORAL
  Filled 2024-05-17 (×9): qty 1

## 2024-05-17 MED ORDER — LOPERAMIDE HCL 2 MG PO CAPS
2.0000 mg | ORAL_CAPSULE | Freq: Four times a day (QID) | ORAL | Status: DC | PRN
  Administered 2024-05-20: 2 mg via ORAL
  Filled 2024-05-17: qty 1

## 2024-05-17 MED ORDER — SODIUM CHLORIDE 0.9 % IV SOLN: INTRAVENOUS | Status: AC

## 2024-05-17 MED ORDER — CYCLOBENZAPRINE HCL 5 MG PO TABS
10.0000 mg | ORAL_TABLET | Freq: Four times a day (QID) | ORAL | Status: DC | PRN
  Administered 2024-05-17: 10 mg via ORAL
  Filled 2024-05-17: qty 2

## 2024-05-17 MED ORDER — CHOLESTYRAMINE 4 G PO PACK: 4.0000 g | PACK | Freq: Every day | ORAL | Status: DC | PRN

## 2024-05-17 MED ORDER — IBUPROFEN 400 MG PO TABS
400.0000 mg | ORAL_TABLET | Freq: Four times a day (QID) | ORAL | Status: DC | PRN
  Administered 2024-05-17 (×2): 400 mg via ORAL
  Filled 2024-05-17 (×2): qty 1

## 2024-05-17 MED ORDER — DIPHENHYDRAMINE HCL 25 MG PO CAPS
25.0000 mg | ORAL_CAPSULE | Freq: Four times a day (QID) | ORAL | Status: DC | PRN
  Administered 2024-05-17: 25 mg via ORAL
  Filled 2024-05-17: qty 1

## 2024-05-17 MED ORDER — TRAZODONE HCL 50 MG PO TABS
50.0000 mg | ORAL_TABLET | Freq: Every evening | ORAL | Status: DC | PRN
  Administered 2024-05-17 – 2024-05-26 (×6): 50 mg via ORAL
  Filled 2024-05-17 (×7): qty 1

## 2024-05-17 NOTE — Consult Note (Addendum)
 Consultation  Referring Provider:  [facility]  Primary Care Physician:  Alyssa Jerel MATSU, MD Primary Gastroenterologist:  [facility]  Reason for Consultation:     acute on chronic anemia  LOS: 1 day          HPI:   53 year old female history of necrotizing fasciitis s/p diverting colostomy June 2021 complicated by parastomal hernia and SBO February 2023, previous GI bleed secondary to peptic ulcer disease, colostomy takedown and partial sigmoid colectomy July 2023, compensated alcoholic cirrhosis, presents for evaluation of heme positive stool and anemia.  PREVIOUS HISTORY  Patient follows with [facility] last seen May of this year.  Previous workup for her liver disease shows history of predominantly elevated AST. last viral hepatitis profile checked in 05/2023 hep A nonimmune hep B negative and nonimmune hep C negative.  ANA , AMA negative.  Normal alpha-1 antitrypsin . Recent Peth positive also previous positive alcohol screening blood .  Patient continues to deny alcohol use  MRI abdomen MRCP with and without contrast done 05/30/2023 with mild CBD dilation, severe hepatomegaly, cirrhosis, biliary sludge but no findings of choledocholithiasis or cholecystitis  Patient has had 3 admissions over the last year for melena.  Most recent admission was October 2024 at [facility].  CTA was negative for active bleed. Patient underwent upper endoscopy on 04/20/2023 by Dr. Murry found to have clean-based pyloric ulcer 13 mm.  Biopsies negative for H. pylori.  Patient followed up with outpatient GI (Dr. Cinderella) and had repeat EGD to assess for healing 01/04/2024 which showed gastritis, duodenitis, otherwise unrevealing with biopsies negative for H. pylori  TODAY  Patient presented (10/28) to [facility] for 6 day history of nausea, vomiting, and diarrhea with positive sick contacts with similar symptoms. she was found to have electrolyte abnormalities (hypokalemia, hypomag, hypoglycemia)  and elevated LFTs with AST predominant. her initial EKG was unrevealing but she then had an EKG that showed  NSTEMI. she was evaluated by cardology who recommended DAPT with statin and close follow up. patient then began to have progressive anemia and weak positive fecal occult so she was transferred from [facility] for further treatment.  Upon initial presentation Hgb was 10.4 (her baseline) which is now 7.1 with hct 20.7 platelets 111. Negative hepatitis panel AST 276/ALT 38/alk phos 180 T. bili 1.4 ethanol 98 on arrival CTAP wo contrast 10/28: anterior abdominal wall hernia, chronic changes from partial sigmoid colectomy, hepatic steatosis ECHO with LVEF > 55%  Patient denies melena/hematochezia.  States her nausea, vomiting, diarrhea have improved since being admitted.  Adamantly denies alcohol use.  PREVIOUS GI WORKUP   EGD to assess for healing 01/04/2024 which showed gastritis, duodenitis, otherwise unrevealing with biopsies negative for H. pylori   EGD 02/2023:- Normal esophagus.                       - Z-line regular.                       - Non-bleeding gastric ulcer with a clean ulcer base (Forrest Class III). Biopsied.                     - Normal duodenal bulb and second portion of the duodenum.  colonsocopy 02/2023 with erosions in sigmoid (bx showed nonspecific mild active colitis), diverticulosis in left colon, 3mm polyp in descending, hemorrhoids, and sacral decubitus stage 2/3   EGD  June 2022: gastritis, normal duodenum.   Colonoscopy Aug 2022: Preparation of the colon was fair.                           - The rectum is normal.                           - No specimens collected.                           - The rectum appeared normal. Previously noted rectal ulcer has healed. Mucosa was quite friable with evidence of minor barotrauma with evaluation of the rectum itself. All air was suctioned out as scope was withdrawn through the remaining colon. No active  colitis. No polyps or evidence of cancer identified.   Past Medical History:  Diagnosis Date   Alcohol abuse    HTN (hypertension) 10/05/2020   Necrotizing fasciitis [facility])     Surgical History:  She  has a past surgical history that includes Irrigation and debridement abscess (N/A, 12/23/2019); Incision and drainage perirectal abscess (N/A, 12/25/2019); Laparoscopic loop colostomy (N/A, 12/25/2019); Flexible sigmoidoscopy (N/A, 01/11/2020); Esophagogastroduodenoscopy (egd) with propofol  (N/A, 10/07/2020); Colonoscopy with propofol  (N/A, 10/07/2020); biopsy (10/07/2020); Esophagogastroduodenoscopy (egd) with propofol  (N/A, 12/27/2020); Colonoscopy with propofol  (02/22/2021); Tonsillectomy; Colostomy takedown (N/A, 01/26/2022); Esophagogastroduodenoscopy (egd) with propofol  (N/A, 01/17/2023); Esophagogastroduodenoscopy (egd) with propofol  (N/A, 03/10/2023); Colonoscopy with propofol  (N/A, 03/10/2023); polypectomy (03/10/2023); biopsy (03/10/2023); and Esophagogastroduodenoscopy (N/A, 01/04/2024). Family History:  Her family history includes Arrhythmia in her mother; Colon polyps in her mother. Social History:   reports that she has never smoked. She has never been exposed to tobacco smoke. She has never used smokeless tobacco. She reports that she does not currently use alcohol. She reports that she does not currently use drugs.  Prior to Admission medications   Medication Sig Start Date End Date Taking? Authorizing Provider  ascorbic acid  (VITAMIN C ) 500 MG tablet Take 500 mg by mouth daily as needed.    [provider]  cholestyramine  (QUESTRAN ) 4 g packet Take 1 packet (4 g total) by mouth daily as needed (diarrhea). 06/02/23   Pearlean Manus, MD  cyclobenzaprine (FLEXERIL) 10 MG tablet Take 10 mg by mouth every 6 (six) hours as needed. 10/26/23   [provider]  doxycycline  (VIBRAMYCIN ) 100 MG capsule Take 100 mg by mouth 2 (two) times daily. 05/06/24   [provider]   ferrous sulfate  325 (65 FE) MG EC tablet Take 1 tablet (325 mg total) by mouth daily with breakfast. 06/02/23   Pearlean Manus, MD  fluticasone  (FLONASE ) 50 MCG/ACT nasal spray Place 2 sprays into both nostrils as needed for allergies or rhinitis. 09/15/22   [provider]  folic acid  (FOLVITE ) 1 MG tablet Take 1 tablet (1 mg total) by mouth daily. 06/02/23   Pearlean Manus, MD  gabapentin  (NEURONTIN ) 100 MG capsule Take 1 capsule (100 mg total) by mouth at bedtime as needed (pain). 06/02/23   Pearlean Manus, MD  hydrocortisone  (ANUSOL -HC) 2.5 % rectal cream Place 1 Application rectally as needed for hemorrhoids or anal itching. 06/02/23   Pearlean Manus, MD  hydrOXYzine  (ATARAX ) 25 MG tablet Take 1 tablet (25 mg total) by mouth as needed for itching. 06/02/23   Pearlean Manus, MD  ibuprofen  (ADVIL ) 800 MG tablet Take 800 mg by mouth every 8 (eight) hours as needed. 05/03/24   [provider]  loperamide  (IMODIUM ) 2 MG capsule Take 2 mg by mouth 4 (four) times daily as needed. 10/26/23   [provider]  loratadine  (CLARITIN ) 10 MG tablet Take 1 tablet (10 mg total) by mouth daily. 11/14/22   Ricky Fines, MD  methylPREDNISolone  (MEDROL  DOSEPAK) 4 MG TBPK tablet Take 4 mg by mouth. 05/06/24   [provider]  montelukast  (SINGULAIR ) 10 MG tablet Take 1 tablet (10 mg total) by mouth at bedtime. 06/02/23   Pearlean Manus, MD  Nystatin  (GERHARDT'S BUTT CREAM) CREA Apply 1 Application topically 4 (four) times daily as needed for irritation. 11/13/22   Ricky Fines, MD  ondansetron  (ZOFRAN -ODT) 4 MG disintegrating tablet Take 4 mg by mouth every 8 (eight) hours as needed. 05/03/24   [provider]  pantoprazole  (PROTONIX ) 40 MG tablet Take 1 tablet (40 mg total) by mouth daily. 01/04/24 04/30/24  Cinderella Deatrice FALCON, MD  potassium chloride  (MICRO-K ) 10 MEQ CR capsule Take 10 mEq by mouth 2 (two) times daily. 05/14/24   [provider]   promethazine  (PHENERGAN ) 25 MG tablet Take 25 mg by mouth every 8 (eight) hours as needed. 05/14/24   [provider]  traZODone  (DESYREL ) 50 MG tablet Take 1 tablet (50 mg total) by mouth at bedtime as needed for sleep (insomnia). 06/02/23   Pearlean Manus, MD    Current Facility-Administered Medications  Medication Dose Route Frequency Provider Last Rate Last Admin   acetaminophen  (TYLENOL ) tablet 650 mg  650 mg Oral Q6H PRN Dorrell, Robert, MD       Or   acetaminophen  (TYLENOL ) suppository 650 mg  650 mg Rectal Q6H PRN Dorrell, Robert, MD       cholestyramine  (QUESTRAN ) packet 4 g  4 g Oral Daily PRN Singh, Prashant K, MD       cyclobenzaprine (FLEXERIL) tablet 10 mg  10 mg Oral Q6H PRN Singh, Prashant K, MD       diphenhydrAMINE  (BENADRYL ) capsule 25 mg  25 mg Oral Q6H PRN Hall, Carole N, DO   25 mg at 05/17/24 0315   famotidine  (PEPCID ) tablet 20 mg  20 mg Oral Daily Singh, Prashant K, MD   20 mg at 05/17/24 0956   [START ON 05/18/2024] ferrous sulfate  EC tablet 325 mg  325 mg Oral Q breakfast Singh, Prashant K, MD       folic acid  (FOLVITE ) tablet 1 mg  1 mg Oral Daily Singh, Prashant K, MD   1 mg at 05/17/24 9043   hydrocortisone  (ANUSOL -HC) 2.5 % rectal cream 1 Application  1 Application Rectal PRN Dennise Lavada POUR, MD       loperamide  (IMODIUM ) capsule 2 mg  2 mg Oral QID PRN Singh, Prashant K, MD       magnesium  sulfate IVPB 4 g 100 mL  4 g Intravenous Once Singh, Prashant K, MD 50 mL/hr at 05/17/24 0926 4 g at 05/17/24 9073   ondansetron  (ZOFRAN ) tablet 4 mg  4 mg Oral Q6H PRN Dena Charleston, MD       Or   ondansetron  (ZOFRAN ) injection 4 mg  4 mg Intravenous Q6H PRN Dena Charleston, MD       traZODone  (DESYREL ) tablet 50 mg  50 mg Oral QHS PRN Singh, Prashant K, MD        Allergies as of 05/16/2024 - Unable to Assess 05/16/2024  Allergen Reaction Noted   Pantoprazole   02/17/2021   Penicillins Hives 12/21/2019   Oxycodone  hcl Rash 10/06/2020    Review of  Systems  Constitutional:  Negative for chills, fever and weight loss.  HENT:  Negative for hearing loss and tinnitus.   Eyes:  Negative for blurred vision and double vision.  Respiratory:  Negative for cough and hemoptysis.   Cardiovascular:  Negative for chest pain and palpitations.  Gastrointestinal:  Positive for diarrhea, nausea and vomiting. Negative for abdominal pain, blood in stool, constipation, heartburn and melena.  Genitourinary:  Negative for dysuria and urgency.  Musculoskeletal:  Negative for myalgias and neck pain.  Skin:  Negative for itching and rash.  Neurological:  Negative for seizures and loss of consciousness.  Psychiatric/Behavioral:  Negative for depression and suicidal ideas.        Physical Exam:  Vital signs in last 24 hours: Temp:  [97.8 F (36.6 C)-98.5 F (36.9 C)] 97.8 F (36.6 C) (11/01 0830) Pulse Rate:  [74] 74 (10/31 2030) Resp:  [18-20] 18 (11/01 0312) BP: (132-157)/(83-104) 132/83 (11/01 0312) SpO2:  [96 %-99 %] 99 % (11/01 0312) Weight:  [73.3 kg] 73.3 kg (10/31 2030) Last BM Date : 05/16/24 Last BM recorded by nurses in past 5 days No data recorded  Physical Exam Constitutional:      Appearance: She is normal weight. She is not ill-appearing.  HENT:     Head: Normocephalic and atraumatic.     Mouth/Throat:     Mouth: Mucous membranes are moist.  Eyes:     General: No scleral icterus.    Extraocular Movements: Extraocular movements intact.  Cardiovascular:     Rate and Rhythm: Normal rate and regular rhythm.  Pulmonary:     Effort: Pulmonary effort is normal. No respiratory distress.  Abdominal:     General: Bowel sounds are normal. There is no distension.     Palpations: Abdomen is soft. There is no mass.     Tenderness: There is no abdominal tenderness.     Hernia: No hernia is present.  Musculoskeletal:        General: No swelling. Normal range of motion.     Cervical back: Normal range of motion and neck supple.  Skin:     General: Skin is warm and dry.  Neurological:     General: No focal deficit present.     Mental Status: She is oriented to person, place, and time.  Psychiatric:        Mood and Affect: Mood normal.        Behavior: Behavior normal.        Thought Content: Thought content normal.        Judgment: Judgment normal.      LAB RESULTS: Recent Labs    05/17/24 0446  WBC 3.7*  HGB 8.8*  HCT 26.4*  PLT 138*   BMET Recent Labs    05/17/24 0446  NA 131*  K 4.7  CL 100  CO2 19*  GLUCOSE 132*  BUN 6  CREATININE 0.59  CALCIUM 8.3*   LFT No results for input(s): PROT, ALBUMIN , AST, ALT, ALKPHOS, BILITOT, BILIDIR, IBILI in the last 72 hours. PT/INR Recent Labs    05/16/24 2334  LABPROT 13.4  INR 1.0    STUDIES: No results found.    Impression    53 year old female history of necrotizing fasciitis s/p diverting colostomy June 2021 complicated by parastomal hernia and SBO February 2023, previous GI bleed secondary to peptic ulcer disease, colostomy takedown and partial sigmoid colectomy July 2023, compensated alcoholic cirrhosis, presents for evaluation of acute on chronic anemia.  Initially presented to  [facility] with nausea, vomiting, diarrhea and positive sick contacts.  EKG showed NSTEMI and cardiology recommended DAPT. Patient was transferred due to positive hemoccult and acute blood loss anemia  Acute on chronic anemia  EGD 04/2023 during admission for melena with 13 mm clean-based gastric ulcer (biopsies negative for H. pylori). Repeat EGD 12/2023 showed gastritis, duodenitis, otherwise normal.  Colonoscopy 02/2023 with diverticulosis and no overt bleeding hgb 10.4 (10/28) dropped to 7.2 (10/31) now 8.8 after 1 unit transfused Baseline hgb 8-10 Pancytopenia with wbc 3.7, hgb 8.8, platelets 138  With positive hemoccult and cardiology plans to start DAPT secondary to NSTEMI she will need updated EGD prior to their medical management.  Alcoholic  cirrhosis (compensated) Previous workup negative for autoimmune, genetic, viral etiology.  Positive Peth test in the past despite patient denying alcohol use. AST 276/ALT 38/alk phos 180, improving from 10/28 on inital admit T. bili 1.4, improving Ferritin 511 negative acute hepatitis ethanol 98 on arrival AFP normal 11/2023 CTAP wo contrast 10/28: anterior abdominal wall hernia, chronic changes from partial sigmoid colectomy, hepatic steatosis Labs possibly reflective of alcoholic hepatitis which could be resulting in her nausea and vomiting. Though likely she may have a combination of infectious etiology with associated diarrhea and positive sick contacts. Discriminant function score not indicative of initiating steroids at this time.  NSTEMI found to have NSTEMI and was recommended to start on statin and DAPT, holding in setting of anemia ECHO with EF >55% -- cardiology to see (?)  necrotizing fasciitis s/p diverting colostomy June 2021 complicated by parastomal hernia and SBO February 2023 with osotomy takedown and partial colectomy with Dr. Debby July 2023 colonsocopy 02/2023 with erosions in sigmoid (bx showed nonspecific mild active colitis), diverticulosis in left colon, 3mm polyp in descending, hemorrhoids, and sacral decubitus stage 2/3    Plan   -- diet today -- IV PPI BID -- continue to trend LFTs -- Continue daily CBC and transfuse as needed to maintain HGB > 7  -- plan for EGD tomorrow -- npo midnight -- I thoroughly discussed the procedure with the patient (at bedside) to include nature of the procedure, alternatives, benefits, and risks (including but not limited to bleeding, infection, perforation, anesthesia/cardiac pulmonary complications).  Patient verbalized understanding and gave verbal consent to proceed with procedure.   Thank you for your kind consultation, we will continue to follow.   Alyssa Carey  05/17/2024, 9:57 AM    -----------------------------------------------------------------------------------------------------------------------------------------------  I have taken a history, reviewed the chart and examined the patient. I performed a substantive portion of this encounter, including complete performance of at least one of the key components, in conjunction with the APP. I agree with the APP's note, impression and recommendations  53 year old female with history of alcoholic liver disease/likely cirrhosis and remote history of necrotizing fasciitis status post diverting colostomy with subsequent takedown, who was admitted to [facility] with several days of nausea and vomiting as well as diarrhea, found to have elevated troponins and dynamic EKG changes concerning for NSTEMI.  Cardiology recommended loading with Plavix, but patient experienced acute drop in hemoglobin (to 7.1) without overt bleeding, prompting transfer to [facility].  She received 1 unit PRBC prior to transfer with appropriate rise in hemoglobin. Patient denies any overt GI bleeding. Cardiology planning to see as outpatient.  No urgent plans for catheterization.  Patient also with elevated liver enzymes, AST predominant with bilirubin elevation.  Her liver enzymes had previously normalized earlier this year.  She had elevated blood  alcohol level.  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  Although it is not clear to me that the patient had a definitive GI bleed, but given her acute drop in hemoglobin, as well as history of GI bleeding sources (outlined above), as well as the potential for needing dual antiplatelet therapy, I recommended we proceed with an upper endoscopy tomorrow to rule out high risk bleeding sources.  I had discussion with the patient about alcohol and her risk for developing decompensated liver disease which would significantly decrease her life  expectancy.  I also mention that her parastomal hernia surgery could be canceled, if her liver disease worsens.  The patient indicated understanding and plans to stop drinking.  Okay for regular diet, n.p.o. after midnight for EGD tomorrow    Shenouda Genova E. Stacia, MD [facility]

## 2024-05-17 NOTE — H&P (View-Only) (Signed)
 Consultation  Referring Provider:  [facility]  Primary Care Physician:  [name], MD Primary Gastroenterologist:  [facility]  Reason for Consultation:     acute on chronic anemia  LOS: 1 day          HPI:   53 year old female history of necrotizing fasciitis s/p diverting colostomy June 2021 complicated by parastomal hernia and SBO February 2023, previous GI bleed secondary to peptic ulcer disease, colostomy takedown and partial sigmoid colectomy July 2023, compensated alcoholic cirrhosis, presents for evaluation of heme positive stool and anemia.  PREVIOUS HISTORY  Patient follows with [facility] last seen May of this year.  Previous workup for her liver disease shows history of predominantly elevated AST. last viral hepatitis profile checked in 05/2023 hep A nonimmune hep B negative and nonimmune hep C negative.  ANA , AMA negative.  Normal alpha-1 antitrypsin . Recent Peth positive also previous positive alcohol screening blood .  Patient continues to deny alcohol use  MRI abdomen MRCP with and without contrast done 05/30/2023 with mild CBD dilation, severe hepatomegaly, cirrhosis, biliary sludge but no findings of choledocholithiasis or cholecystitis  Patient has had 3 admissions over the last year for melena.  Most recent admission was October 2024 at [facility].  CTA was negative for active bleed. Patient underwent upper endoscopy on 04/20/2023 by Dr. Murry found to have clean-based pyloric ulcer 13 mm.  Biopsies negative for H. pylori.  Patient followed up with outpatient GI (Dr. Cinderella) and had repeat EGD to assess for healing 01/04/2024 which showed gastritis, duodenitis, otherwise unrevealing with biopsies negative for H. pylori  TODAY  Patient presented (10/28) to [facility] for 6 day history of nausea, vomiting, and diarrhea with positive sick contacts with similar symptoms. she was found to have electrolyte abnormalities (hypokalemia, hypomag, hypoglycemia)  and elevated LFTs with AST predominant. her initial EKG was unrevealing but she then had an EKG that showed  NSTEMI. she was evaluated by cardology who recommended DAPT with statin and close follow up. patient then began to have progressive anemia and weak positive fecal occult so she was transferred from [facility] for further treatment.  Upon initial presentation Hgb was 10.4 (her baseline) which is now 7.1 with hct 20.7 platelets 111. Negative hepatitis panel AST 276/ALT 38/alk phos 180 T. bili 1.4 ethanol 98 on arrival CTAP wo contrast 10/28: anterior abdominal wall hernia, chronic changes from partial sigmoid colectomy, hepatic steatosis ECHO with LVEF > 55%  Patient denies melena/hematochezia.  States her nausea, vomiting, diarrhea have improved since being admitted.  Adamantly denies alcohol use.  PREVIOUS GI WORKUP   EGD to assess for healing 01/04/2024 which showed gastritis, duodenitis, otherwise unrevealing with biopsies negative for H. pylori   EGD 02/2023:- Normal esophagus.                       - Z-line regular.                       - Non-bleeding gastric ulcer with a clean ulcer base (Forrest Class III). Biopsied.                     - Normal duodenal bulb and second portion of the duodenum.  colonsocopy 02/2023 with erosions in sigmoid (bx showed nonspecific mild active colitis), diverticulosis in left colon, 3mm polyp in descending, hemorrhoids, and sacral decubitus stage 2/3   EGD  June 2022: gastritis, normal duodenum.   Colonoscopy Aug 2022: Preparation of the colon was fair.                           - The rectum is normal.                           - No specimens collected.                           - The rectum appeared normal. Previously noted rectal ulcer has healed. Mucosa was quite friable with evidence of minor barotrauma with evaluation of the rectum itself. All air was suctioned out as scope was withdrawn through the remaining colon. No active  colitis. No polyps or evidence of cancer identified.   Past Medical History:  Diagnosis Date   Alcohol abuse    HTN (hypertension) 10/05/2020   Necrotizing fasciitis [facility])     Surgical History:  She  has a past surgical history that includes Irrigation and debridement abscess (N/A, 12/23/2019); Incision and drainage perirectal abscess (N/A, 12/25/2019); Laparoscopic loop colostomy (N/A, 12/25/2019); Flexible sigmoidoscopy (N/A, 01/11/2020); Esophagogastroduodenoscopy (egd) with propofol  (N/A, 10/07/2020); Colonoscopy with propofol  (N/A, 10/07/2020); biopsy (10/07/2020); Esophagogastroduodenoscopy (egd) with propofol  (N/A, 12/27/2020); Colonoscopy with propofol  (02/22/2021); Tonsillectomy; Colostomy takedown (N/A, 01/26/2022); Esophagogastroduodenoscopy (egd) with propofol  (N/A, 01/17/2023); Esophagogastroduodenoscopy (egd) with propofol  (N/A, 03/10/2023); Colonoscopy with propofol  (N/A, 03/10/2023); polypectomy (03/10/2023); biopsy (03/10/2023); and Esophagogastroduodenoscopy (N/A, 01/04/2024). Family History:  Her family history includes Arrhythmia in her mother; Colon polyps in her mother. Social History:   reports that she has never smoked. She has never been exposed to tobacco smoke. She has never used smokeless tobacco. She reports that she does not currently use alcohol. She reports that she does not currently use drugs.  Prior to Admission medications   Medication Sig Start Date End Date Taking? Authorizing Provider  ascorbic acid  (VITAMIN C ) 500 MG tablet Take 500 mg by mouth daily as needed.    [provider]  cholestyramine  (QUESTRAN ) 4 g packet Take 1 packet (4 g total) by mouth daily as needed (diarrhea). 06/02/23   Pearlean Manus, MD  cyclobenzaprine (FLEXERIL) 10 MG tablet Take 10 mg by mouth every 6 (six) hours as needed. 10/26/23   [provider]  doxycycline  (VIBRAMYCIN ) 100 MG capsule Take 100 mg by mouth 2 (two) times daily. 05/06/24   [provider]   ferrous sulfate  325 (65 FE) MG EC tablet Take 1 tablet (325 mg total) by mouth daily with breakfast. 06/02/23   Pearlean Manus, MD  fluticasone  (FLONASE ) 50 MCG/ACT nasal spray Place 2 sprays into both nostrils as needed for allergies or rhinitis. 09/15/22   [provider]  folic acid  (FOLVITE ) 1 MG tablet Take 1 tablet (1 mg total) by mouth daily. 06/02/23   Pearlean Manus, MD  gabapentin  (NEURONTIN ) 100 MG capsule Take 1 capsule (100 mg total) by mouth at bedtime as needed (pain). 06/02/23   Pearlean Manus, MD  hydrocortisone  (ANUSOL -HC) 2.5 % rectal cream Place 1 Application rectally as needed for hemorrhoids or anal itching. 06/02/23   Pearlean Manus, MD  hydrOXYzine  (ATARAX ) 25 MG tablet Take 1 tablet (25 mg total) by mouth as needed for itching. 06/02/23   Pearlean Manus, MD  ibuprofen  (ADVIL ) 800 MG tablet Take 800 mg by mouth every 8 (eight) hours as needed. 05/03/24   [provider]  loperamide  (IMODIUM ) 2 MG capsule Take 2 mg by mouth 4 (four) times daily as needed. 10/26/23   [provider]  loratadine  (CLARITIN ) 10 MG tablet Take 1 tablet (10 mg total) by mouth daily. 11/14/22   Ricky Fines, MD  methylPREDNISolone  (MEDROL  DOSEPAK) 4 MG TBPK tablet Take 4 mg by mouth. 05/06/24   [provider]  montelukast  (SINGULAIR ) 10 MG tablet Take 1 tablet (10 mg total) by mouth at bedtime. 06/02/23   Pearlean Manus, MD  Nystatin  (GERHARDT'S BUTT CREAM) CREA Apply 1 Application topically 4 (four) times daily as needed for irritation. 11/13/22   Ricky Fines, MD  ondansetron  (ZOFRAN -ODT) 4 MG disintegrating tablet Take 4 mg by mouth every 8 (eight) hours as needed. 05/03/24   [provider]  pantoprazole  (PROTONIX ) 40 MG tablet Take 1 tablet (40 mg total) by mouth daily. 01/04/24 04/30/24  Cinderella Deatrice FALCON, MD  potassium chloride  (MICRO-K ) 10 MEQ CR capsule Take 10 mEq by mouth 2 (two) times daily. 05/14/24   [provider]   promethazine  (PHENERGAN ) 25 MG tablet Take 25 mg by mouth every 8 (eight) hours as needed. 05/14/24   [provider]  traZODone  (DESYREL ) 50 MG tablet Take 1 tablet (50 mg total) by mouth at bedtime as needed for sleep (insomnia). 06/02/23   Pearlean Manus, MD    Current Facility-Administered Medications  Medication Dose Route Frequency Provider Last Rate Last Admin   acetaminophen  (TYLENOL ) tablet 650 mg  650 mg Oral Q6H PRN Dorrell, Robert, MD       Or   acetaminophen  (TYLENOL ) suppository 650 mg  650 mg Rectal Q6H PRN Dorrell, Robert, MD       cholestyramine  (QUESTRAN ) packet 4 g  4 g Oral Daily PRN Singh, Prashant K, MD       cyclobenzaprine (FLEXERIL) tablet 10 mg  10 mg Oral Q6H PRN Singh, Prashant K, MD       diphenhydrAMINE  (BENADRYL ) capsule 25 mg  25 mg Oral Q6H PRN Hall, Carole N, DO   25 mg at 05/17/24 0315   famotidine  (PEPCID ) tablet 20 mg  20 mg Oral Daily Singh, Prashant K, MD   20 mg at 05/17/24 0956   [START ON 05/18/2024] ferrous sulfate  EC tablet 325 mg  325 mg Oral Q breakfast Singh, Prashant K, MD       folic acid  (FOLVITE ) tablet 1 mg  1 mg Oral Daily Singh, Prashant K, MD   1 mg at 05/17/24 9043   hydrocortisone  (ANUSOL -HC) 2.5 % rectal cream 1 Application  1 Application Rectal PRN Dennise Lavada POUR, MD       loperamide  (IMODIUM ) capsule 2 mg  2 mg Oral QID PRN Singh, Prashant K, MD       magnesium  sulfate IVPB 4 g 100 mL  4 g Intravenous Once Singh, Prashant K, MD 50 mL/hr at 05/17/24 0926 4 g at 05/17/24 9073   ondansetron  (ZOFRAN ) tablet 4 mg  4 mg Oral Q6H PRN Dena Charleston, MD       Or   ondansetron  (ZOFRAN ) injection 4 mg  4 mg Intravenous Q6H PRN Dena Charleston, MD       traZODone  (DESYREL ) tablet 50 mg  50 mg Oral QHS PRN Singh, Prashant K, MD        Allergies as of 05/16/2024 - Unable to Assess 05/16/2024  Allergen Reaction Noted   Pantoprazole   02/17/2021   Penicillins Hives 12/21/2019   Oxycodone  hcl Rash 10/06/2020    Review of  Systems  Constitutional:  Negative for chills, fever and weight loss.  HENT:  Negative for hearing loss and tinnitus.   Eyes:  Negative for blurred vision and double vision.  Respiratory:  Negative for cough and hemoptysis.   Cardiovascular:  Negative for chest pain and palpitations.  Gastrointestinal:  Positive for diarrhea, nausea and vomiting. Negative for abdominal pain, blood in stool, constipation, heartburn and melena.  Genitourinary:  Negative for dysuria and urgency.  Musculoskeletal:  Negative for myalgias and neck pain.  Skin:  Negative for itching and rash.  Neurological:  Negative for seizures and loss of consciousness.  Psychiatric/Behavioral:  Negative for depression and suicidal ideas.        Physical Exam:  Vital signs in last 24 hours: Temp:  [97.8 F (36.6 C)-98.5 F (36.9 C)] 97.8 F (36.6 C) (11/01 0830) Pulse Rate:  [74] 74 (10/31 2030) Resp:  [18-20] 18 (11/01 0312) BP: (132-157)/(83-104) 132/83 (11/01 0312) SpO2:  [96 %-99 %] 99 % (11/01 0312) Weight:  [73.3 kg] 73.3 kg (10/31 2030) Last BM Date : 05/16/24 Last BM recorded by nurses in past 5 days No data recorded  Physical Exam Constitutional:      Appearance: She is normal weight. She is not ill-appearing.  HENT:     Head: Normocephalic and atraumatic.     Mouth/Throat:     Mouth: Mucous membranes are moist.  Eyes:     General: No scleral icterus.    Extraocular Movements: Extraocular movements intact.  Cardiovascular:     Rate and Rhythm: Normal rate and regular rhythm.  Pulmonary:     Effort: Pulmonary effort is normal. No respiratory distress.  Abdominal:     General: Bowel sounds are normal. There is no distension.     Palpations: Abdomen is soft. There is no mass.     Tenderness: There is no abdominal tenderness.     Hernia: No hernia is present.  Musculoskeletal:        General: No swelling. Normal range of motion.     Cervical back: Normal range of motion and neck supple.  Skin:     General: Skin is warm and dry.  Neurological:     General: No focal deficit present.     Mental Status: She is oriented to person, place, and time.  Psychiatric:        Mood and Affect: Mood normal.        Behavior: Behavior normal.        Thought Content: Thought content normal.        Judgment: Judgment normal.      LAB RESULTS: Recent Labs    05/17/24 0446  WBC 3.7*  HGB 8.8*  HCT 26.4*  PLT 138*   BMET Recent Labs    05/17/24 0446  NA 131*  K 4.7  CL 100  CO2 19*  GLUCOSE 132*  BUN 6  CREATININE 0.59  CALCIUM 8.3*   LFT No results for input(s): PROT, ALBUMIN , AST, ALT, ALKPHOS, BILITOT, BILIDIR, IBILI in the last 72 hours. PT/INR Recent Labs    05/16/24 2334  LABPROT 13.4  INR 1.0    STUDIES: No results found.    Impression    53 year old female history of necrotizing fasciitis s/p diverting colostomy June 2021 complicated by parastomal hernia and SBO February 2023, previous GI bleed secondary to peptic ulcer disease, colostomy takedown and partial sigmoid colectomy July 2023, compensated alcoholic cirrhosis, presents for evaluation of acute on chronic anemia.  Initially presented to  [facility] with nausea, vomiting, diarrhea and positive sick contacts.  EKG showed NSTEMI and cardiology recommended DAPT. Patient was transferred due to positive hemoccult and acute blood loss anemia  Acute on chronic anemia  EGD 04/2023 during admission for melena with 13 mm clean-based gastric ulcer (biopsies negative for H. pylori). Repeat EGD 12/2023 showed gastritis, duodenitis, otherwise normal.  Colonoscopy 02/2023 with diverticulosis and no overt bleeding hgb 10.4 (10/28) dropped to 7.2 (10/31) now 8.8 after 1 unit transfused Baseline hgb 8-10 Pancytopenia with wbc 3.7, hgb 8.8, platelets 138  With positive hemoccult and cardiology plans to start DAPT secondary to NSTEMI she will need updated EGD prior to their medical management.  Alcoholic  cirrhosis (compensated) Previous workup negative for autoimmune, genetic, viral etiology.  Positive Peth test in the past despite patient denying alcohol use. AST 276/ALT 38/alk phos 180, improving from 10/28 on inital admit T. bili 1.4, improving Ferritin 511 negative acute hepatitis ethanol 98 on arrival AFP normal 11/2023 CTAP wo contrast 10/28: anterior abdominal wall hernia, chronic changes from partial sigmoid colectomy, hepatic steatosis Labs possibly reflective of alcoholic hepatitis which could be resulting in her nausea and vomiting. Though likely she may have a combination of infectious etiology with associated diarrhea and positive sick contacts. Discriminant function score not indicative of initiating steroids at this time.  NSTEMI found to have NSTEMI and was recommended to start on statin and DAPT, holding in setting of anemia ECHO with EF >55% -- cardiology to see (?)  necrotizing fasciitis s/p diverting colostomy June 2021 complicated by parastomal hernia and SBO February 2023 with osotomy takedown and partial colectomy with Dr. Debby July 2023 colonsocopy 02/2023 with erosions in sigmoid (bx showed nonspecific mild active colitis), diverticulosis in left colon, 3mm polyp in descending, hemorrhoids, and sacral decubitus stage 2/3    Plan   -- diet today -- IV PPI BID -- continue to trend LFTs -- Continue daily CBC and transfuse as needed to maintain HGB > 7  -- plan for EGD tomorrow -- npo midnight -- I thoroughly discussed the procedure with the patient (at bedside) to include nature of the procedure, alternatives, benefits, and risks (including but not limited to bleeding, infection, perforation, anesthesia/cardiac pulmonary complications).  Patient verbalized understanding and gave verbal consent to proceed with procedure.   Thank you for your kind consultation, we will continue to follow.   Bayley CHRISTELLA Blower  05/17/2024, 9:57 AM    -----------------------------------------------------------------------------------------------------------------------------------------------  I have taken a history, reviewed the chart and examined the patient. I performed a substantive portion of this encounter, including complete performance of at least one of the key components, in conjunction with the APP. I agree with the APP's note, impression and recommendations  53 year old female with history of alcoholic liver disease/likely cirrhosis and remote history of necrotizing fasciitis status post diverting colostomy with subsequent takedown, who was admitted to [facility] with several days of nausea and vomiting as well as diarrhea, found to have elevated troponins and dynamic EKG changes concerning for NSTEMI.  Cardiology recommended loading with Plavix, but patient experienced acute drop in hemoglobin (to 7.1) without overt bleeding, prompting transfer to [facility].  She received 1 unit PRBC prior to transfer with appropriate rise in hemoglobin. Patient denies any overt GI bleeding. Cardiology planning to see as outpatient.  No urgent plans for catheterization.  Patient also with elevated liver enzymes, AST predominant with bilirubin elevation.  Her liver enzymes had previously normalized earlier this year.  She had elevated blood  alcohol level.  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  Although it is not clear to me that the patient had a definitive GI bleed, but given her acute drop in hemoglobin, as well as history of GI bleeding sources (outlined above), as well as the potential for needing dual antiplatelet therapy, I recommended we proceed with an upper endoscopy tomorrow to rule out high risk bleeding sources.  I had discussion with the patient about alcohol and her risk for developing decompensated liver disease which would significantly decrease her life  expectancy.  I also mention that her parastomal hernia surgery could be canceled, if her liver disease worsens.  The patient indicated understanding and plans to stop drinking.  Okay for regular diet, n.p.o. after midnight for EGD tomorrow    Shenouda Genova E. Stacia, MD [facility]

## 2024-05-17 NOTE — Plan of Care (Signed)
  Problem: Clinical Measurements: Goal: Diagnostic test results will improve Outcome: Progressing   Problem: Pain Managment: Goal: General experience of comfort will improve and/or be controlled Outcome: Progressing

## 2024-05-17 NOTE — Progress Notes (Signed)
 PROGRESS NOTE                                                                                                                                                                                                             Patient Demographics:    Alyssa Carey, is a 53 y.o. female, DOB - 1970/10/01, FMW:[id number]  Outpatient Primary MD for the patient is [name], MD    LOS - 1  Admit date - 05/16/2024    Chief Complaint  Patient presents with   GI Problem   Code STEMI       Brief Narrative (HPI from H&P)    53 y.o. female with medical history significant of alcoholic hepatitis, hyperlipidemia, hypertension, a flutter who presented with 6 days of nausea vomiting diarrhea.  Patient was at [facility] and numerous reported sick contacts.  She thought she had a gastrointestinal illness.  She presented to the ER where she was found to be afebrile and hemodynamically stable.  She was found to have hypokalemia, hypomagnesemia and elevated LFTs so she was admitted for further workup.  Her electrolytes were repleted.  At the outside hospital she was found to have elevated troponins with ST changes on EKG.  Cardiology was consulted and patient was recommended to start dual antiplatelet.  Patient was referred for outpatient follow-up however bloody stools and acute blood loss anemia was transferred for further management.    Subjective:    [name] today has, No headache, No chest pain, No abdominal pain - No Nausea, No new weakness tingling or numbness, no SOB, no blood in stool or melena.   Assessment  & Plan :   Nausea vomiting and diarrhea, likely gastroenteritis.  History of chronic anemia on iron and folic acid  supplements, history of chronic loose stools takes Imodium  and cholestyramine  as needed. With supportive care her symptoms are much better, she will be continued on her as needed medications along with iron and  folic acid  supplementation, abdominal exam is benign, she was checked for fecal occult blood and it was weakly positive at the outlying ER.  No signs of ongoing active GI bleed.  Outpatient GI follow-up will suffice, monitor H&H, continue Pepcid /PPI, monitor CBC again tomorrow.  Random mildly elevated troponin at outlying ER.  Unclear reason why it was checked, no chest pain, repeat EKG,  check echocardiogram, troponin here is normal.  Chest pain-free symptom-free monitor         Condition - Fair  Family Communication  :  None  Code Status :  Full  Consults  :  None  PUD Prophylaxis : PPI   Procedures  :            Disposition Plan  :    Status is: Inpatient   DVT Prophylaxis  :    SCDs Start: 05/16/24 2126    Lab Results  Component Value Date   PLT 138 (L) 05/17/2024    Diet :  Diet Order             Diet regular Room service appropriate? Yes; Fluid consistency: Thin  Diet effective now                    Inpatient Medications  Scheduled Meds:  famotidine   20 mg Oral Daily   [START ON 05/18/2024] ferrous sulfate   325 mg Oral Q breakfast   folic acid   1 mg Oral Daily   Continuous Infusions:  magnesium  sulfate bolus IVPB     PRN Meds:.acetaminophen  **OR** acetaminophen , cholestyramine , cyclobenzaprine, diphenhydrAMINE , hydrocortisone , loperamide , ondansetron  **OR** ondansetron  (ZOFRAN ) IV, traZODone   Antibiotics  :    Anti-infectives (From admission, onward)    None         Objective:   Vitals:   05/16/24 2030 05/16/24 2315 05/17/24 0312 05/17/24 0830  BP: (!) 154/99 (!) 157/104 132/83   Pulse: 74     Resp: 18 20 18    Temp: 97.9 F (36.6 C) 97.9 F (36.6 C) 98.5 F (36.9 C) 97.8 F (36.6 C)  TempSrc: Oral Oral Oral Oral  SpO2:  96% 99%   Weight: 73.3 kg     Height: 4' 11 (1.499 m)       Wt Readings from Last 3 Encounters:  05/16/24 73.3 kg  02/18/24 72.4 kg  01/04/24 73 kg     Intake/Output Summary (Last 24 hours) at  05/17/2024 0921 Last data filed at 05/17/2024 0300 Gross per 24 hour  Intake 290 ml  Output --  Net 290 ml     Physical Exam  Awake Alert, No new F.N deficits, Normal affect [place].AT,PERRAL Supple Neck, No JVD,   Symmetrical Chest wall movement, Good air movement bilaterally, CTAB RRR,No Gallops,Rubs or new Murmurs,  +ve B.Sounds, Abd Soft, No tenderness,   No Cyanosis, Clubbing or edema       Data Review:    Recent Labs  Lab 05/17/24 0446  WBC 3.7*  HGB 8.8*  HCT 26.4*  PLT 138*  MCV 93.0  MCH 31.0  MCHC 33.3  RDW 19.4*  LYMPHSABS 0.8  MONOABS 0.2  EOSABS 0.0  BASOSABS 0.0    Recent Labs  Lab 05/16/24 2334 05/17/24 0446  NA  --  131*  K  --  4.7  CL  --  100  CO2  --  19*  ANIONGAP  --  12  GLUCOSE  --  132*  BUN  --  6  CREATININE  --  0.59  INR 1.0  --   MG  --  1.4*  CALCIUM  --  8.3*      Recent Labs  Lab 05/16/24 2334 05/17/24 0446  INR 1.0  --   MG  --  1.4*  CALCIUM  --  8.3*    --------------------------------------------------------------------------------------------------------------- Lab Results  Component Value Date   TRIG 462 (H) 08/22/2021  Lab Results  Component Value Date   HGBA1C 5.1 08/18/2021   No results for input(s): TSH, T4TOTAL, FREET4, T3FREE, THYROIDAB in the last 72 hours. Recent Labs    05/17/24 0733  FOLATE 3.3*  FERRITIN 511*  TIBC 266  IRON 256*  RETICCTPCT 1.5   ------------------------------------------------------------------------------------------------------------------ Cardiac Enzymes No results for input(s): CKMB, TROPONINI, MYOGLOBIN in the last 168 hours.  Invalid input(s): CK  Micro Results Recent Results (from the past 240 hours)  MRSA Next Gen by PCR, Nasal     Status: None   Collection Time: 05/17/24  3:04 AM   Specimen: Nasal Mucosa; Nasal Swab  Result Value Ref Range Status   MRSA by PCR Next Gen NOT DETECTED NOT DETECTED Final    Comment: (NOTE) The  GeneXpert MRSA Assay (FDA approved for NASAL specimens only), is one component of a comprehensive MRSA colonization surveillance program. It is not intended to diagnose MRSA infection nor to guide or monitor treatment for MRSA infections. Test performance is not FDA approved in patients less than 70 years old. Performed at [facility], [address], [place], [place] [address]     Radiology Report No results found.   Signature  -   Lavada Stank M.D on 05/17/2024 at 9:21 AM   -  To page go to [email or web address]

## 2024-05-18 ENCOUNTER — Encounter (HOSPITAL_COMMUNITY)
Admission: RE | Disposition: A | Discharge status: Home / Self Care | Payer: Self-pay | Source: Home / Self Care | Attending: Internal Medicine

## 2024-05-18 ENCOUNTER — Encounter (HOSPITAL_COMMUNITY): Payer: Self-pay | Admitting: Internal Medicine

## 2024-05-18 ENCOUNTER — Inpatient Hospital Stay (HOSPITAL_COMMUNITY): Payer: Self-pay | Admitting: Anesthesiology

## 2024-05-18 DIAGNOSIS — I509 Heart failure, unspecified: Secondary | ICD-10-CM

## 2024-05-18 DIAGNOSIS — I110 Hypertensive heart disease with heart failure: Secondary | ICD-10-CM

## 2024-05-18 DIAGNOSIS — K259 Gastric ulcer, unspecified as acute or chronic, without hemorrhage or perforation: Secondary | ICD-10-CM

## 2024-05-18 DIAGNOSIS — K2950 Unspecified chronic gastritis without bleeding: Secondary | ICD-10-CM

## 2024-05-18 DIAGNOSIS — K254 Chronic or unspecified gastric ulcer with hemorrhage: Secondary | ICD-10-CM

## 2024-05-18 DIAGNOSIS — D62 Acute posthemorrhagic anemia: Secondary | ICD-10-CM | POA: Diagnosis not present

## 2024-05-18 DIAGNOSIS — K222 Esophageal obstruction: Secondary | ICD-10-CM

## 2024-05-18 DIAGNOSIS — K209 Esophagitis, unspecified without bleeding: Secondary | ICD-10-CM

## 2024-05-18 DIAGNOSIS — K449 Diaphragmatic hernia without obstruction or gangrene: Secondary | ICD-10-CM

## 2024-05-18 HISTORY — PX: BALLOON DILATION: SHX5330

## 2024-05-18 HISTORY — PX: BONE BIOPSY: SHX375

## 2024-05-18 HISTORY — PX: ESOPHAGOGASTRODUODENOSCOPY: SHX5428

## 2024-05-18 LAB — COMPREHENSIVE METABOLIC PANEL WITH GFR
ALT: 25 U/L (ref 0–44)
AST: 109 U/L — ABNORMAL HIGH (ref 15–41)
Albumin: 2.7 g/dL — ABNORMAL LOW (ref 3.5–5.0)
Alkaline Phosphatase: 138 U/L — ABNORMAL HIGH (ref 38–126)
Anion gap: 10 (ref 5–15)
BUN: 6 mg/dL (ref 6–20)
CO2: 22 mmol/L (ref 22–32)
Calcium: 8.2 mg/dL — ABNORMAL LOW (ref 8.9–10.3)
Chloride: 101 mmol/L (ref 98–111)
Creatinine, Ser: 0.64 mg/dL (ref 0.44–1.00)
GFR, Estimated: >60 mL/min (ref >60)
Glucose, Bld: 77 mg/dL (ref 70–99)
Potassium: 4.5 mmol/L (ref 3.5–5.1)
Sodium: 133 mmol/L — ABNORMAL LOW (ref 135–145)
Total Bilirubin: 1.4 mg/dL — ABNORMAL HIGH (ref 0.0–1.2)
Total Protein: 5.3 g/dL — ABNORMAL LOW (ref 6.5–8.1)

## 2024-05-18 LAB — CBC WITH DIFFERENTIAL/PLATELET
Abs Immature Granulocytes: 0.02 K/uL (ref 0.00–0.07)
Basophils Absolute: 0 K/uL (ref 0.0–0.1)
Basophils Relative: 0 %
Eosinophils Absolute: 0.1 K/uL (ref 0.0–0.5)
Eosinophils Relative: 2 %
HCT: 25.1 % — ABNORMAL LOW (ref 36.0–46.0)
Hemoglobin: 8.3 g/dL — ABNORMAL LOW (ref 12.0–15.0)
Immature Granulocytes: 0 %
Lymphocytes Relative: 27 %
Lymphs Abs: 1.3 K/uL (ref 0.7–4.0)
MCH: 31.1 pg (ref 26.0–34.0)
MCHC: 33.1 g/dL (ref 30.0–36.0)
MCV: 94 fL (ref 80.0–100.0)
Monocytes Absolute: 0.3 K/uL (ref 0.1–1.0)
Monocytes Relative: 7 %
Neutro Abs: 3 K/uL (ref 1.7–7.7)
Neutrophils Relative %: 64 %
Platelets: 137 K/uL — ABNORMAL LOW (ref 150–400)
RBC: 2.67 MIL/uL — ABNORMAL LOW (ref 3.87–5.11)
RDW: 19.3 % — ABNORMAL HIGH (ref 11.5–15.5)
WBC: 4.7 K/uL (ref 4.0–10.5)
nRBC: 0 % (ref 0.0–0.2)

## 2024-05-18 LAB — MAGNESIUM: Magnesium: 1.7 mg/dL (ref 1.7–2.4)

## 2024-05-18 LAB — HEMOGLOBIN A1C
Hgb A1c MFr Bld: 4.3 % — ABNORMAL LOW (ref 4.8–5.6)
Mean Plasma Glucose: 76.71 mg/dL

## 2024-05-18 SURGERY — EGD (ESOPHAGOGASTRODUODENOSCOPY)
Anesthesia: Monitor Anesthesia Care

## 2024-05-18 MED ORDER — HYDROCODONE-ACETAMINOPHEN 5-325 MG PO TABS
1.0000 | ORAL_TABLET | Freq: Once | ORAL | Status: AC
  Administered 2024-05-18: 1 via ORAL
  Filled 2024-05-18: qty 1

## 2024-05-18 MED ORDER — OXYCODONE-ACETAMINOPHEN 5-325 MG PO TABS: 1.0000 | ORAL_TABLET | Freq: Four times a day (QID) | ORAL | Status: DC | PRN

## 2024-05-18 MED ORDER — MIDAZOLAM HCL (PF) 2 MG/2ML IJ SOLN: 0.5000 mg | Freq: Once | INTRAMUSCULAR | Status: DC | PRN

## 2024-05-18 MED ORDER — MEPERIDINE HCL 25 MG/ML IJ SOLN
6.2500 mg | INTRAMUSCULAR | Status: DC | PRN
  Filled 2024-05-18: qty 1

## 2024-05-18 MED ORDER — HYDROCODONE-ACETAMINOPHEN 5-325 MG PO TABS
1.0000 | ORAL_TABLET | Freq: Four times a day (QID) | ORAL | Status: DC | PRN
  Administered 2024-05-18 – 2024-05-26 (×23): 1 via ORAL
  Filled 2024-05-18 (×23): qty 1

## 2024-05-18 MED ORDER — PHENOL 1.4 % MT LIQD
1.0000 | OROMUCOSAL | Status: DC | PRN
  Administered 2024-05-19: 1 via OROMUCOSAL
  Filled 2024-05-18: qty 177

## 2024-05-18 MED ORDER — PROPOFOL 10 MG/ML IV BOLUS
INTRAVENOUS | Status: DC | PRN
  Administered 2024-05-18: 20 mg via INTRAVENOUS

## 2024-05-18 MED ORDER — PROPOFOL 500 MG/50ML IV EMUL
INTRAVENOUS | Status: DC | PRN
  Administered 2024-05-18: 100 ug/kg/min via INTRAVENOUS

## 2024-05-18 MED ORDER — MENTHOL 3 MG MT LOZG
1.0000 | LOZENGE | OROMUCOSAL | Status: DC | PRN
  Administered 2024-05-18 – 2024-05-20 (×5): 3 mg via ORAL
  Filled 2024-05-18 (×4): qty 9

## 2024-05-18 MED ORDER — NALOXONE HCL 0.4 MG/ML IJ SOLN: 0.4000 mg | INTRAMUSCULAR | Status: DC | PRN

## 2024-05-18 MED ORDER — FENTANYL CITRATE (PF) 100 MCG/2ML IJ SOLN: 25.0000 ug | INTRAMUSCULAR | Status: DC | PRN

## 2024-05-18 MED ORDER — MAGNESIUM SULFATE IN D5W 1-5 GM/100ML-% IV SOLN
1.0000 g | Freq: Once | INTRAVENOUS | Status: AC
  Administered 2024-05-18: 1 g via INTRAVENOUS
  Filled 2024-05-18 (×2): qty 100

## 2024-05-18 NOTE — Anesthesia Postprocedure Evaluation (Signed)
 Anesthesia Post Note  Patient: Alyssa Carey  Procedure(s) Performed: EGD (ESOPHAGOGASTRODUODENOSCOPY) BIOPSY, GI BALLOON DILATION     Patient location during evaluation: PACU Anesthesia Type: MAC Level of consciousness: awake and alert, patient cooperative and oriented Pain management: pain level controlled Vital Signs Assessment: post-procedure vital signs reviewed and stable Respiratory status: spontaneous breathing, nonlabored ventilation and respiratory function stable Cardiovascular status: blood pressure returned to baseline and stable Postop Assessment: no apparent nausea or vomiting Anesthetic complications: no   No notable events documented.  Last Vitals:  Vitals:   05/18/24 1115 05/18/24 1202  BP: (!) 147/94 (!) 145/89  Pulse: 97 95  Resp: 20 17  Temp: 36.8 C 37 C  SpO2: 98% 97%    Last Pain:  Vitals:   05/18/24 1202  TempSrc: Oral  PainSc:                  [name],E. Pearl Bents

## 2024-05-18 NOTE — Plan of Care (Signed)
   Problem: Clinical Measurements: Goal: Diagnostic test results will improve Outcome: Progressing   Problem: Nutrition: Goal: Adequate nutrition will be maintained Outcome: Progressing

## 2024-05-18 NOTE — Op Note (Signed)
 [facility] Patient Name: Alyssa Carey Procedure Date : 05/18/2024 MRN: [id number] Attending MD: Glendia BRAVO. Stacia , MD, [id number] Date of Birth: 1971/06/13 CSN: [id number] Age: 53 Admit Type: Inpatient Procedure:                Upper GI endoscopy Indications:              Acute post hemorrhagic anemia, Recent                            gastrointestinal bleeding Providers:                Glendia E. Stacia, MD, Collene Edu, RN, Corene Southgate, Technician Referring MD:              Medicines:                Monitored Anesthesia Care Complications:            No immediate complications. Estimated Blood Loss:     Estimated blood loss was minimal. Procedure:                Pre-Anesthesia Assessment:                           - Prior to the procedure, a History and Physical                            was performed, and patient medications and                            allergies were reviewed. The patient's tolerance of                            previous anesthesia was also reviewed. The risks                            and benefits of the procedure and the sedation                            options and risks were discussed with the patient.                            All questions were answered, and informed consent                            was obtained. Prior Anticoagulants: The patient has                            taken no anticoagulant or antiplatelet agents. ASA                            Grade Assessment: III - A patient with severe  systemic disease. After reviewing the risks and                            benefits, the patient was deemed in satisfactory                            condition to undergo the procedure.                           After obtaining informed consent, the endoscope was                            passed under direct vision. Throughout the                            procedure, the  patient's blood pressure, pulse, and                            oxygen saturations were monitored continuously. The                            [id number]-H190 ([id number]) Olympus endoscope was introduced                            through the mouth, and advanced to the second part                            of duodenum. The upper GI endoscopy was                            accomplished without difficulty. The patient                            tolerated the procedure well. Scope In: Scope Out: Findings:      One benign-appearing, intrinsic moderate stenosis was found 33 to 35 cm       from the incisors. This stenosis measured 1 cm (inner diameter) x 2 cm       (in length). A TTS dilator was passed through the scope. Dilation with a       12-13.5-15 mm balloon dilator was performed to 13.5 mm. The dilation       site was examined and showed mild mucosal disruption. Estimated blood       loss was minimal.      The exam of the esophagus was otherwise normal.      Biopsies were obtained from the proximal and distal esophagus with cold       forceps for histology to exclude eosinophilic esophagitis. Estimated       blood loss was minimal.      Many non-bleeding superficial gastric ulcers with no stigmata of       bleeding were found in the gastric body and in the gastric antrum. One       ulcer in the prepyloric stomach was cratered, but without bleeding       stigmata. Biopsies were taken with a cold forceps for Helicobacter       pylori testing.  Estimated blood loss was minimal.      A 2 cm hiatal hernia was present.      The exam of the stomach was otherwise normal.      The examined duodenum was normal. Impression:               - Benign-appearing esophageal stenosis. Dilated. No                            evidence of esophageal varices.                           - Non-bleeding gastric ulcers with no stigmata of                            bleeding. Biopsied.                           - 2 cm  hiatal hernia.                           - Normal examined duodenum.                           - Biopsies were taken with a cold forceps for                            evaluation of eosinophilic esophagitis. Moderate Sedation:      N/A Recommendation:           - Return patient to hospital ward for ongoing care.                           - Resume regular diet.                           - Continue Protonix  (pantoprazole ) 40 mg IV twice                            daily. She will need twice daily PO Protonix  for 8                            weeks, followed once daily PPI indefinitely                           - Repeat upper endoscopy in 8 weeks to check                            healing.                           - Await pathology results.                           - GI will sign off at this time. She can follow up  with her primary GI provider to discuss repeat EGD                            to assess healing of gastric ulcers. Procedure Code(s):        --- Professional ---                           [phone], Esophagogastroduodenoscopy, flexible,                            transoral; with transendoscopic balloon dilation of                            esophagus (less than 30 mm diameter)                           43239, 59, Esophagogastroduodenoscopy, flexible,                            transoral; with biopsy, single or multiple Diagnosis Code(s):        --- Professional ---                           K22.2, Esophageal obstruction                           K25.9, Gastric ulcer, unspecified as acute or                            chronic, without hemorrhage or perforation                           K44.9, Diaphragmatic hernia without obstruction or                            gangrene                           D62, Acute posthemorrhagic anemia                           K92.2, Gastrointestinal hemorrhage, unspecified CPT copyright 2022 American Medical Association. All  rights reserved. The codes documented in this report are preliminary and upon coder review may  be revised to meet current compliance requirements. Elroy Schembri E. Stacia, MD 05/18/2024 10:54:57 AM This report has been signed electronically. Number of Addenda: 0

## 2024-05-18 NOTE — Anesthesia Preprocedure Evaluation (Addendum)
 Anesthesia Evaluation  Patient identified by MRN, date of birth, ID band Patient awake    Reviewed: Allergy & Precautions, NPO status , Patient's Chart, lab work & pertinent test results  History of Anesthesia Complications Negative for: history of anesthetic complications  Airway Mallampati: II  TM Distance: >3 FB Neck ROM: Full    Dental  (+) Dental Advisory Given   Pulmonary asthma , COPD,  COPD inhaler   breath sounds clear to auscultation       Cardiovascular hypertension (not on meds),  Rhythm:Regular Rate:Normal  05/17/2024 ECHO: EF 60 to 65%.  1.The LV has normal function. The left ventricle demonstrates regional wall motion abnormalities, lateral wall appears to be  hypokinetic. Grade I diastolic dysfunction (impaired relaxation).   2. RVF is normal. The right ventricular size is normal. There is normal pulmonary artery systolic pressure.   3. The mitral valve is normal in structure. Mild mitral valve regurgitation. No evidence of mitral stenosis.   4. The aortic valve is normal in structure. Aortic valve regurgitation is not visualized. No aortic stenosis is present.     Neuro/Psych  PSYCHIATRIC DISORDERS (ETOH)      negative neurological ROS     GI/Hepatic PUD,GERD  Medicated and Controlled,,(+) Cirrhosis     substance abuse  alcohol useElevated LFTs No N/V x4 days H/o diverting colostomy, now closed H/o SBO H/o Nec fasciitis   Endo/Other  BMI 32.6  Renal/GU Renal InsufficiencyRenal disease     Musculoskeletal H/o necrotizing fasciitis   Abdominal   Peds  Hematology Hb 8.3, plt 137k   Anesthesia Other Findings   Reproductive/Obstetrics                              Anesthesia Physical Anesthesia Plan  ASA: 3  Anesthesia Plan: MAC   Post-op Pain Management: Minimal or no pain anticipated   Induction:   PONV Risk Score and Plan: 2 and Treatment may vary due to age or  medical condition  Airway Management Planned: Natural Airway and Simple Face Mask  Additional Equipment: None  Intra-op Plan:   Post-operative Plan:   Informed Consent: I have reviewed the patients History and Physical, chart, labs and discussed the procedure including the risks, benefits and alternatives for the proposed anesthesia with the patient or authorized representative who has indicated his/her understanding and acceptance.     Dental advisory given  Plan Discussed with: CRNA and Surgeon  Anesthesia Plan Comments:          Anesthesia Quick Evaluation

## 2024-05-18 NOTE — Progress Notes (Signed)
 [facility] night cross cover note:   I was notified by the patient's RN that the patient reports continued throat discomfort after today's EGD with dilation, noting that the discomfort is refractory to prn acetaminophen .  Additionally, patient reports that her throat discomfort improved following dose of Norco earlier today.  I subsequently added prn Chloraseptic throat spray, prn Cepacol throat lozenges, as well as Norco 5/325 mg p.o. every 6 hours as needed for pain.     Eva Pore, DO Hospitalist

## 2024-05-18 NOTE — Interval H&P Note (Signed)
 History and Physical Interval Note:  05/18/2024 10:02 AM  Alyssa Carey  has presented today for surgery, with the diagnosis of acute on chronic anemia, heme positive stool, hx of PUD.  The various methods of treatment have been discussed with the patient and family. After consideration of risks, benefits and other options for treatment, the patient has consented to  Procedure(s): EGD (ESOPHAGOGASTRODUODENOSCOPY) (N/A) as a surgical intervention.  The patient's history has been reviewed, patient examined, no change in status, stable for surgery.  I have reviewed the patient's chart and labs.  Questions were answered to the patient's satisfaction.    Patient's TTE showed some wall motion abnormalities.  I contacted Dr. Dennise who spoke with cardiology who did not feel that there was any acute concern that would prohibit proceeding with the EGD.   Alyssa Carey

## 2024-05-18 NOTE — Transfer of Care (Signed)
 Immediate Anesthesia Transfer of Care Note  Patient: Alyssa Carey  Procedure(s) Performed: EGD (ESOPHAGOGASTRODUODENOSCOPY) BIOPSY, GI BALLOON DILATION  Patient Location: PACU  Anesthesia Type:MAC  Level of Consciousness: awake and alert   Airway & Oxygen Therapy: Patient Spontanous Breathing and Patient connected to nasal cannula oxygen  Post-op Assessment: Report given to RN and Post -op Vital signs reviewed and stable  Post vital signs: Reviewed and stable  Last Vitals:  Vitals Value Taken Time  BP 137/86 05/18/24 10:47  Temp    Pulse 98 05/18/24 10:50  Resp 25 05/18/24 10:50  SpO2 100 % 05/18/24 10:50  Vitals shown include unfiled device data.  Last Pain:  Vitals:   05/18/24 0948  TempSrc: Temporal  PainSc: 7          Complications: No notable events documented.

## 2024-05-18 NOTE — Progress Notes (Signed)
 PROGRESS NOTE                                                                                                                                                                                                             Patient Demographics:    Alyssa Carey, is a 53 y.o. female, DOB - 1971-07-13, FMW:[id number]  Outpatient Primary MD for the patient is Alyssa Jerel MATSU, MD    LOS - 2  Admit date - 05/16/2024    Chief Complaint  Patient presents with   GI Problem   Code STEMI       Brief Narrative (HPI from H&P)    53 y.o. female with medical history significant of alcoholic hepatitis, hyperlipidemia, hypertension, a flutter who presented with 6 days of nausea vomiting diarrhea.  Patient was at [facility] and numerous reported sick contacts.  She thought she had a gastrointestinal illness.  She presented to the ER where she was found to be afebrile and hemodynamically stable.  She was found to have hypokalemia, hypomagnesemia and elevated LFTs so she was admitted for further workup.  Her electrolytes were repleted.  At the outside hospital she was found to have elevated troponins with ST changes on EKG.  Cardiology was consulted and patient was recommended to start dual antiplatelet.  Patient was referred for outpatient follow-up however bloody stools and acute blood loss anemia was transferred for further management.    Subjective:   Patient in bed, appears comfortable, denies any headache, no fever, no chest pain or pressure, no shortness of breath , no abdominal pain. No focal weakness.  No blood in stool or black stool.   Assessment  & Plan :   Nausea vomiting and diarrhea, likely gastroenteritis.  History of chronic anemia on iron and folic acid  supplements, history of chronic loose stools takes Imodium  and cholestyramine  as needed. With supportive care her symptoms are much better, she will be continued on her as needed  medications along with iron and folic acid  supplementation, abdominal exam is benign, she was checked for fecal occult blood and it was weakly positive at the outlying ER.  No signs of ongoing active GI bleed.  GI following EGD on 05/18/2024.  Random mildly elevated troponin at outlying ER.  Unclear reason why it was checked, he had no chest pain, no chest pain here,  EKG shows changes suggestive of LVH, history of smoking, echo shows a preserved EF but does have wall motion abnormality.  Troponin was negative, case discussed with cardiologist Dr. Walton who has cleared her for EGD along with outpatient cardiology follow-up.  No inpatient testing.  If cleared by GI will place her on a beta-blocker, statin and baby aspirin  with outpatient cardiology follow-up.         Condition - Fair  Family Communication  :  None  Code Status :  Full  Consults  :  None  PUD Prophylaxis : PPI   Procedures  :      Echo -  1. Left ventricular ejection fraction, by estimation, is 60 to 65%. The left ventricle has normal function. The left ventricle demonstrates regional wall motion abnormalities, lateral wall appears to be hypokinetic. Consider Limited Echo with contrast for evaluation of RWMA. Left ventricular diastolic parameters are consistent with Grade I diastolic dysfunction (impaired relaxation).  2. Right ventricular systolic function is normal. The right ventricular size is normal. There is normal pulmonary artery systolic pressure.  3. The mitral valve is normal in structure. Mild mitral valve regurgitation. No evidence of mitral stenosis.  4. The aortic valve is normal in structure. Aortic valve regurgitation is not visualized. No aortic stenosis is present.  5. The inferior vena cava is normal in size with greater than 50% respiratory variability, suggesting right atrial pressure of 3 mmHg.       Disposition Plan  :    Status is: Inpatient   DVT Prophylaxis  :    SCDs Start: 05/16/24 2126     Lab Results  Component Value Date   PLT 137 (L) 05/18/2024    Diet :  Diet Order             Diet NPO time specified  Diet effective midnight                    Inpatient Medications  Scheduled Meds:  [MAR Hold] carvedilol  3.125 mg Oral BID WC   [MAR Hold] famotidine   20 mg Oral Daily   [MAR Hold] feeding supplement  237 mL Oral BID BM   [MAR Hold] ferrous sulfate   325 mg Oral Q breakfast   [MAR Hold] folic acid   1 mg Oral Daily   Continuous Infusions:  sodium chloride  Stopped (05/17/24 1838)   PRN Meds:.[MAR Hold] acetaminophen  **OR** [MAR Hold] acetaminophen , [MAR Hold] cholestyramine , [MAR Hold] cyclobenzaprine, [MAR Hold] diphenhydrAMINE , [MAR Hold] hydrocortisone , [MAR Hold] ibuprofen , [MAR Hold] loperamide , [MAR Hold] ondansetron  **OR** [MAR Hold] ondansetron  (ZOFRAN ) IV, [MAR Hold] traZODone   Antibiotics  :    Anti-infectives (From admission, onward)    None         Objective:   Vitals:   05/17/24 1752 05/18/24 0007 05/18/24 0458 05/18/24 0948  BP: 130/86 135/80 136/82 (!) 150/91  Pulse: 92 90 100 97  Resp:  18 18 20   Temp:  97.8 F (36.6 C) 98 F (36.7 C) 98 F (36.7 C)  TempSrc:  Oral Oral Temporal  SpO2:  98%  98%  Weight:      Height:        Wt Readings from Last 3 Encounters:  05/16/24 73.3 kg  02/18/24 72.4 kg  01/04/24 73 kg     Intake/Output Summary (Last 24 hours) at 05/18/2024 0949 Last data filed at 05/17/2024 2100 Gross per 24 hour  Intake 0.47 ml  Output --  Net 0.47 ml  Physical Exam  Awake Alert, No new F.N deficits, Normal affect [place].AT,PERRAL Supple Neck, No JVD,   Symmetrical Chest wall movement, Good air movement bilaterally, CTAB RRR,No Gallops,Rubs or new Murmurs,  +ve B.Sounds, Abd Soft, No tenderness,   No Cyanosis, Clubbing or edema       Data Review:    Recent Labs  Lab 05/17/24 0446 05/18/24 0442  WBC 3.7* 4.7  HGB 8.8* 8.3*  HCT 26.4* 25.1*  PLT 138* 137*  MCV 93.0 94.0  MCH 31.0  31.1  MCHC 33.3 33.1  RDW 19.4* 19.3*  LYMPHSABS 0.8 1.3  MONOABS 0.2 0.3  EOSABS 0.0 0.1  BASOSABS 0.0 0.0    Recent Labs  Lab 05/16/24 2334 05/17/24 0446 05/18/24 0442 05/18/24 0459  NA  --  131* 133*  --   K  --  4.7 4.5  --   CL  --  100 101  --   CO2  --  19* 22  --   ANIONGAP  --  12 10  --   GLUCOSE  --  132* 77  --   BUN  --  6 6  --   CREATININE  --  0.59 0.64  --   AST  --   --  109*  --   ALT  --   --  25  --   ALKPHOS  --   --  138*  --   BILITOT  --   --  1.4*  --   ALBUMIN   --   --  2.7*  --   INR 1.0  --   --   --   MG  --  1.4*  --  1.7  CALCIUM  --  8.3* 8.2*  --       Recent Labs  Lab 05/16/24 2334 05/17/24 0446 05/18/24 0442 05/18/24 0459  INR 1.0  --   --   --   MG  --  1.4*  --  1.7  CALCIUM  --  8.3* 8.2*  --     --------------------------------------------------------------------------------------------------------------- Lab Results  Component Value Date   TRIG 462 (H) 08/22/2021    Lab Results  Component Value Date   HGBA1C 5.1 08/18/2021   No results for input(s): TSH, T4TOTAL, FREET4, T3FREE, THYROIDAB in the last 72 hours. Recent Labs    05/17/24 0732 05/17/24 0733  VITAMINB12 326  --   FOLATE  --  3.3*  FERRITIN  --  511*  TIBC  --  266  IRON  --  256*  RETICCTPCT  --  1.5   ------------------------------------------------------------------------------------------------------------------ Cardiac Enzymes No results for input(s): CKMB, TROPONINI, MYOGLOBIN in the last 168 hours.  Invalid input(s): CK  Micro Results Recent Results (from the past 240 hours)  MRSA Next Gen by PCR, Nasal     Status: None   Collection Time: 05/17/24  3:04 AM   Specimen: Nasal Mucosa; Nasal Swab  Result Value Ref Range Status   MRSA by PCR Next Gen NOT DETECTED NOT DETECTED Final    Comment: (NOTE) The GeneXpert MRSA Assay (FDA approved for NASAL specimens only), is one component of a comprehensive MRSA  colonization surveillance program. It is not intended to diagnose MRSA infection nor to guide or monitor treatment for MRSA infections. Test performance is not FDA approved in patients less than 44 years old. Performed at [facility], [address] [place], [place] [address]     Radiology Report  ECHOCARDIOGRAM COMPLETE Result Date: 05/17/2024  ECHOCARDIOGRAM REPORT   Patient Name:   SHANETRA BLUMENSTOCK Date of Exam: 05/17/2024 Medical Rec #:  [id number]       Height:       59.0 in Accession #:    [id number]      Weight:       161.6 lb Date of Birth:  21-Sep-1970       BSA:          1.685 m Patient Age:    53 years        BP:           95/61 mmHg Patient Gender: F               HR:           96 bpm. Exam Location:  Inpatient Procedure: 2D Echo, Cardiac Doppler and Color Doppler (Both Spectral and Color            Flow Doppler were utilized during procedure). Indications:    Chest Pain  History:        Patient has prior history of Echocardiogram examinations, most                 recent 04/05/2023. Risk Factors:Hypertension.  Sonographer:    Philomena Daring Referring Phys: LAMAR DESS IMPRESSIONS  1. Left ventricular ejection fraction, by estimation, is 60 to 65%. The left ventricle has normal function. The left ventricle demonstrates regional wall motion abnormalities, lateral wall appears to be hypokinetic. Consider Limited Echo with contrast for evaluation of RWMA. Left ventricular diastolic parameters are consistent with Grade I diastolic dysfunction (impaired relaxation).  2. Right ventricular systolic function is normal. The right ventricular size is normal. There is normal pulmonary artery systolic pressure.  3. The mitral valve is normal in structure. Mild mitral valve regurgitation. No evidence of mitral stenosis.  4. The aortic valve is normal in structure. Aortic valve regurgitation is not visualized. No aortic stenosis is present.  5. The inferior vena cava is normal in size with greater  than 50% respiratory variability, suggesting right atrial pressure of 3 mmHg. Comparison(s): Changes from prior study are noted. FINDINGS  Left Ventricle: Left ventricular ejection fraction, by estimation, is 60 to 65%. The left ventricle has normal function. The left ventricle demonstrates regional wall motion abnormalities. Strain was performed and the global longitudinal strain is indeterminate. The left ventricular internal cavity size was normal in size. There is no left ventricular hypertrophy. Left ventricular diastolic parameters are consistent with Grade I diastolic dysfunction (impaired relaxation). Normal left ventricular filling pressure.  LV Wall Scoring: The entire lateral wall is hypokinetic. The entire septum and apex are normal. Right Ventricle: The right ventricular size is normal. No increase in right ventricular wall thickness. Right ventricular systolic function is normal. There is normal pulmonary artery systolic pressure. The tricuspid regurgitant velocity is 2.17 m/s, and  with an assumed right atrial pressure of 3 mmHg, the estimated right ventricular systolic pressure is 21.8 mmHg. Left Atrium: Left atrial size was normal in size. Right Atrium: Right atrial size was normal in size. Pericardium: There is no evidence of pericardial effusion. Mitral Valve: The mitral valve is normal in structure. Mild mitral valve regurgitation. No evidence of mitral valve stenosis. Tricuspid Valve: The tricuspid valve is normal in structure. Tricuspid valve regurgitation is mild . No evidence of tricuspid stenosis. Aortic Valve: The aortic valve is normal in structure. Aortic valve regurgitation is not visualized. No aortic stenosis is present. Pulmonic Valve: The pulmonic  valve was thickened with good excursion. Pulmonic valve regurgitation is trivial. No evidence of pulmonic stenosis. Aorta: The aortic root and ascending aorta are structurally normal, with no evidence of dilitation. Venous: The inferior  vena cava is normal in size with greater than 50% respiratory variability, suggesting right atrial pressure of 3 mmHg. IAS/Shunts: No atrial level shunt detected by color flow Doppler. Additional Comments: 3D was performed not requiring image post processing on an independent workstation and was indeterminate.  LEFT VENTRICLE PLAX 2D LVIDd:         5.20 cm   Diastology LVIDs:         3.30 cm   LV e' medial:    8.49 cm/s LV PW:         1.00 cm   LV E/e' medial:  11.6 LV IVS:        1.00 cm   LV e' lateral:   10.60 cm/s LVOT diam:     2.10 cm   LV E/e' lateral: 9.3 LV SV:         72 LV SV Index:   43 LVOT Area:     3.46 cm LV IVRT:       74 msec  RIGHT VENTRICLE             IVC RV Basal diam:  3.10 cm     IVC diam: 1.40 cm RV Mid diam:    2.40 cm RV S prime:     17.70 cm/s  PULMONARY VEINS TAPSE (M-mode): 1.8 cm      Diastolic Velocity: 50.30 cm/s                             S/D Velocity:       1.60                             Systolic Velocity:  81.80 cm/s LEFT ATRIUM             Index        RIGHT ATRIUM           Index LA diam:        3.00 cm 1.78 cm/m   RA Area:     11.60 cm LA Vol (A2C):   42.0 ml 24.93 ml/m  RA Volume:   24.50 ml  14.54 ml/m LA Vol (A4C):   34.4 ml 20.42 ml/m LA Biplane Vol: 38.1 ml 22.62 ml/m  AORTIC VALVE LVOT Vmax:   121.00 cm/s LVOT Vmean:  79.100 cm/s LVOT VTI:    0.207 m  AORTA Ao Root diam: 2.60 cm Ao Asc diam:  3.00 cm MITRAL VALVE               TRICUSPID VALVE MV Area (PHT): 5.20 cm    TR Peak grad:   18.8 mmHg MV Decel Time: 146 msec    TR Vmax:        217.00 cm/s MV E velocity: 98.50 cm/s MV A velocity: 85.40 cm/s  SHUNTS MV E/A ratio:  1.15        Systemic VTI:  0.21 m                            Systemic Diam: 2.10 cm Vishnu Priya Mallipeddi Electronically signed by Diannah Late Mallipeddi Signature Date/Time: 05/17/2024/2:00:54 PM    Final  Signature  -   Lavada Stank M.D on 05/18/2024 at 9:49 AM   -  To page go to [email or web address]

## 2024-05-18 NOTE — Discharge Instructions (Addendum)
 Follow with Primary MD [name], MD in 3 days, follow-up with the recommended cardiologist and gastroenterologist in 2 to 3 weeks.  Get CBC, CMP, Magnesium  -  checked next visit with your primary MD    Activity: As tolerated with Full fall precautions use walker/cane & assistance as needed  Disposition Home    Diet: Heart Healthy    Special Instructions: If you have smoked or chewed Tobacco  in the last 2 yrs please stop smoking, stop any regular Alcohol  and or any Recreational drug use.  On your next visit with your primary care physician please Get Medicines reviewed and adjusted.  Please request your Prim.MD to go over all Hospital Tests and Procedure/Radiological results at the follow up, please get all Hospital records sent to your Prim MD by signing hospital release before you go home.  If you experience worsening of your admission symptoms, develop shortness of breath, life threatening emergency, suicidal or homicidal thoughts you must seek medical attention immediately by calling 911 or calling your MD immediately  if symptoms less severe.  You Must read complete instructions/literature along with all the possible adverse reactions/side effects for all the Medicines you take and that have been prescribed to you. Take any new Medicines after you have completely understood and accpet all the possible adverse reactions/side effects.   Do not drive when taking Pain medications.  Do not take more than prescribed Pain, Sleep and Anxiety Medications  Wear Seat belts while driving.

## 2024-05-18 NOTE — Progress Notes (Signed)
 Pt NPO since 12am, CHG bath given, new grown on.  Awaiting transport.

## 2024-05-19 ENCOUNTER — Other Ambulatory Visit (HOSPITAL_COMMUNITY): Payer: Self-pay

## 2024-05-19 DIAGNOSIS — K921 Melena: Secondary | ICD-10-CM | POA: Diagnosis not present

## 2024-05-19 DIAGNOSIS — K254 Chronic or unspecified gastric ulcer with hemorrhage: Secondary | ICD-10-CM

## 2024-05-19 DIAGNOSIS — K222 Esophageal obstruction: Secondary | ICD-10-CM

## 2024-05-19 LAB — CBC WITH DIFFERENTIAL/PLATELET
Abs Immature Granulocytes: 0.03 K/uL (ref 0.00–0.07)
Basophils Absolute: 0 K/uL (ref 0.0–0.1)
Basophils Relative: 1 %
Eosinophils Absolute: 0.1 K/uL (ref 0.0–0.5)
Eosinophils Relative: 1 %
HCT: 27.7 % — ABNORMAL LOW (ref 36.0–46.0)
Hemoglobin: 9.2 g/dL — ABNORMAL LOW (ref 12.0–15.0)
Immature Granulocytes: 1 %
Lymphocytes Relative: 32 %
Lymphs Abs: 2.1 K/uL (ref 0.7–4.0)
MCH: 31.2 pg (ref 26.0–34.0)
MCHC: 33.2 g/dL (ref 30.0–36.0)
MCV: 93.9 fL (ref 80.0–100.0)
Monocytes Absolute: 0.5 K/uL (ref 0.1–1.0)
Monocytes Relative: 8 %
Neutro Abs: 3.8 K/uL (ref 1.7–7.7)
Neutrophils Relative %: 57 %
Platelets: 175 K/uL (ref 150–400)
RBC: 2.95 MIL/uL — ABNORMAL LOW (ref 3.87–5.11)
RDW: 18.9 % — ABNORMAL HIGH (ref 11.5–15.5)
WBC: 6.6 K/uL (ref 4.0–10.5)
nRBC: 0.3 % — ABNORMAL HIGH (ref 0.0–0.2)

## 2024-05-19 LAB — CBC
HCT: 26.5 % — ABNORMAL LOW (ref 36.0–46.0)
HCT: 23.7 % — ABNORMAL LOW (ref 36.0–46.0)
Hemoglobin: 8.6 g/dL — ABNORMAL LOW (ref 12.0–15.0)
Hemoglobin: 7.7 g/dL — ABNORMAL LOW (ref 12.0–15.0)
MCH: 30.8 pg (ref 26.0–34.0)
MCH: 30.8 pg (ref 26.0–34.0)
MCHC: 32.5 g/dL (ref 30.0–36.0)
MCHC: 32.5 g/dL (ref 30.0–36.0)
MCV: 95 fL (ref 80.0–100.0)
MCV: 94.8 fL (ref 80.0–100.0)
Platelets: 155 K/uL (ref 150–400)
Platelets: 123 K/uL — ABNORMAL LOW (ref 150–400)
RBC: 2.79 MIL/uL — ABNORMAL LOW (ref 3.87–5.11)
RBC: 2.5 MIL/uL — ABNORMAL LOW (ref 3.87–5.11)
RDW: 18.6 % — ABNORMAL HIGH (ref 11.5–15.5)
RDW: 18.5 % — ABNORMAL HIGH (ref 11.5–15.5)
WBC: 4.9 K/uL (ref 4.0–10.5)
WBC: 5.3 K/uL (ref 4.0–10.5)
nRBC: 0 % (ref 0.0–0.2)
nRBC: 0 % (ref 0.0–0.2)

## 2024-05-19 LAB — COMPREHENSIVE METABOLIC PANEL WITH GFR
ALT: 26 U/L (ref 0–44)
AST: 102 U/L — ABNORMAL HIGH (ref 15–41)
Albumin: 2.7 g/dL — ABNORMAL LOW (ref 3.5–5.0)
Alkaline Phosphatase: 146 U/L — ABNORMAL HIGH (ref 38–126)
Anion gap: 8 (ref 5–15)
BUN: 13 mg/dL (ref 6–20)
CO2: 24 mmol/L (ref 22–32)
Calcium: 8.7 mg/dL — ABNORMAL LOW (ref 8.9–10.3)
Chloride: 98 mmol/L (ref 98–111)
Creatinine, Ser: 0.61 mg/dL (ref 0.44–1.00)
GFR, Estimated: >60 mL/min (ref >60)
Glucose, Bld: 105 mg/dL — ABNORMAL HIGH (ref 70–99)
Potassium: 4.5 mmol/L (ref 3.5–5.1)
Sodium: 130 mmol/L — ABNORMAL LOW (ref 135–145)
Total Bilirubin: 1.5 mg/dL — ABNORMAL HIGH (ref 0.0–1.2)
Total Protein: 5.7 g/dL — ABNORMAL LOW (ref 6.5–8.1)

## 2024-05-19 LAB — LIPID PANEL
Cholesterol: 194 mg/dL (ref 0–200)
HDL: 85 mg/dL (ref >40)
LDL Cholesterol: 94 mg/dL (ref 0–99)
Total CHOL/HDL Ratio: 2.3 ratio
Triglycerides: 77 mg/dL (ref <150)
VLDL: 15 mg/dL (ref 0–40)

## 2024-05-19 LAB — MAGNESIUM: Magnesium: 1.6 mg/dL — ABNORMAL LOW (ref 1.7–2.4)

## 2024-05-19 MED ORDER — FAMOTIDINE 20 MG PO TABS
20.0000 mg | ORAL_TABLET | Freq: Two times a day (BID) | ORAL | 0 refills | Status: DC
  Filled 2024-05-19: qty 60, 30d supply, fill #0

## 2024-05-19 MED ORDER — DEXAMETHASONE SODIUM PHOSPHATE 4 MG/ML IJ SOLN
4.0000 mg | Freq: Once | INTRAMUSCULAR | Status: AC
  Administered 2024-05-19: 4 mg via INTRAVENOUS
  Filled 2024-05-19: qty 1

## 2024-05-19 MED ORDER — HYDRALAZINE HCL 50 MG PO TABS
50.0000 mg | ORAL_TABLET | Freq: Three times a day (TID) | ORAL | Status: DC
  Administered 2024-05-19 – 2024-05-24 (×9): 50 mg via ORAL
  Filled 2024-05-19 (×17): qty 1

## 2024-05-19 MED ORDER — CARVEDILOL 6.25 MG PO TABS
6.2500 mg | ORAL_TABLET | Freq: Two times a day (BID) | ORAL | Status: DC
Start: 2024-05-19 — End: 2024-05-26
  Administered 2024-05-19 – 2024-05-25 (×11): 6.25 mg via ORAL
  Filled 2024-05-19 (×12): qty 1

## 2024-05-19 MED ORDER — CARVEDILOL 3.125 MG PO TABS
3.1250 mg | ORAL_TABLET | Freq: Two times a day (BID) | ORAL | 0 refills | Status: DC
  Filled 2024-05-19: qty 60, 30d supply, fill #0

## 2024-05-19 MED ORDER — PANTOPRAZOLE SODIUM 40 MG IV SOLR
40.0000 mg | Freq: Two times a day (BID) | INTRAVENOUS | Status: DC
  Administered 2024-05-19 – 2024-05-22 (×6): 40 mg via INTRAVENOUS
  Filled 2024-05-19 (×7): qty 10

## 2024-05-19 MED ORDER — ACETAMINOPHEN 325 MG PO TABS
650.0000 mg | ORAL_TABLET | Freq: Four times a day (QID) | ORAL | 0 refills | Status: AC | PRN
  Filled 2024-05-19: qty 15, 2d supply, fill #0

## 2024-05-19 MED ORDER — ROSUVASTATIN CALCIUM 10 MG PO TABS
10.0000 mg | ORAL_TABLET | Freq: Every day | ORAL | 0 refills | Status: AC
  Filled 2024-05-19: qty 30, 30d supply, fill #0

## 2024-05-19 MED ORDER — MAGNESIUM SULFATE 4 GM/100ML IV SOLN
4.0000 g | Freq: Once | INTRAVENOUS | Status: AC
  Administered 2024-05-19: 4 g via INTRAVENOUS
  Filled 2024-05-19: qty 100

## 2024-05-19 MED ORDER — LACTATED RINGERS IV SOLN: INTRAVENOUS | Status: DC

## 2024-05-19 MED ORDER — CARVEDILOL 6.25 MG PO TABS
6.2500 mg | ORAL_TABLET | Freq: Two times a day (BID) | ORAL | 0 refills | Status: DC
  Filled 2024-05-19: qty 60, 30d supply, fill #0

## 2024-05-19 MED ORDER — MENTHOL 3 MG MT LOZG
1.0000 | LOZENGE | Freq: Three times a day (TID) | OROMUCOSAL | 0 refills | Status: AC | PRN
  Filled 2024-05-19: qty 15, 5d supply, fill #0

## 2024-05-19 NOTE — Plan of Care (Signed)
  Problem: Clinical Measurements: Goal: Ability to maintain clinical measurements within normal limits will improve Outcome: Progressing   Problem: Clinical Measurements: Goal: Will remain free from infection 05/19/2024 1058 by Con Marylen SAUNDERS, RN Outcome: Progressing 05/19/2024 1056 by Con Marylen SAUNDERS, RN Outcome: Progressing   Problem: Clinical Measurements: Goal: Diagnostic test results will improve 05/19/2024 1058 by Con Marylen SAUNDERS, RN Outcome: Progressing 05/19/2024 1056 by Con Marylen SAUNDERS, RN Outcome: Progressing   Problem: Coping: Goal: Level of anxiety will decrease Outcome: Progressing   Problem: Elimination: Goal: Will not experience complications related to urinary retention Outcome: Progressing

## 2024-05-19 NOTE — Plan of Care (Signed)
 Post discharge patient had dark emesis, discharge held, switch to IV PPI she has a low intensity allergy of upset stomach after PPI, n.p.o. except medications, monitor CBC, type screen, had EGD yesterday.  GI also informed.

## 2024-05-19 NOTE — Progress Notes (Signed)
 Patient ID: Alyssa Carey, female   DOB: [birth year], 53 y.o.   MRN: [id number]    Progress Note   Subjective   Day # 3 CC;Hematemesis-asked by hospitalist to reevaluate patient today for recurrent hematemesis  Patient with history of necrotizing fasciitis status post diverting colostomy 2021 complicated by parastomal hernia and small bowel obstruction February 2023 prior GI bleed secondary to peptic ulcer disease, colostomy takedown and partial sigmoid colectomy July 2023, compensated EtOH cirrhosis    EGD yesterday with finding of a benign appearing esophageal stenosis at 33-35 from the incisors, TTS dilator was passed through the scope dilated to 13.5 mm, esophagus otherwise normal multiple nonbleeding superficial gastric ulcers with no stigmata of bleeding in the gastric body and antrum, there was 1 ulcer in the prepyloric stomach cratered but without bleeding biopsies taken  Labs today-WBC 6.6/hemoglobin 9.2/hematocrit 27.7 stable from yesterday at which time hemoglobin was 8.3 Sodium 130/potassium 4.5 T. bili 1.5/alk phos 146/AST 102/ALT 26  Patient says she was nauseated this morning and vomited about 3 times, she thinks it looked blackish with specks in it.  She had a bowel movement last evening that she says was brown and normal-appearing.    Objective   Vital signs in last 24 hours: Temp:  [98 F (36.7 C)-98.8 F (37.1 C)] 98 F (36.7 C) (11/03 0800) Pulse Rate:  [95-102] 102 (11/03 0027) Resp:  [17-18] 18 (11/03 0800) BP: (128-148)/(82-89) 148/82 (11/03 0800) SpO2:  [97 %-98 %] 98 % (11/03 0800) Last BM Date : 05/16/24 General:    African-American female in NAD, sitting on the side of the bed Heart: Tacky at 100 regular rate and rhythm; no murmurs Lungs: Respirations even and unlabored, lungs CTA bilaterally Abdomen:  Soft, no focal tenderness nondistended. Normal bowel sounds. Extremities:  Without edema. Neurologic:  Alert and oriented,  grossly normal  neurologically. Psych:  Cooperative. Normal mood and affect.  Intake/Output from previous day: 11/02 0701 - 11/03 0700 In: 250 [I.V.:250] Out: -  Intake/Output this shift: No intake/output data recorded.  Lab Results: Recent Labs    05/17/24 0446 05/18/24 0442 05/19/24 0511  WBC 3.7* 4.7 6.6  HGB 8.8* 8.3* 9.2*  HCT 26.4* 25.1* 27.7*  PLT 138* 137* 175   BMET Recent Labs    05/17/24 0446 05/18/24 0442 05/19/24 0511  NA 131* 133* 130*  K 4.7 4.5 4.5  CL 100 101 98  CO2 19* 22 24  GLUCOSE 132* 77 105*  BUN 6 6 13   CREATININE 0.59 0.64 0.61  CALCIUM 8.3* 8.2* 8.7*   LFT Recent Labs    05/19/24 0511  PROT 5.7*  ALBUMIN  2.7*  AST 102*  ALT 26  ALKPHOS 146*  BILITOT 1.5*   PT/INR Recent Labs    05/16/24 2334  LABPROT 13.4  INR 1.0    Studies/Results: ECHOCARDIOGRAM COMPLETE Result Date: 05/17/2024    ECHOCARDIOGRAM REPORT   Patient Name:   Alyssa Carey Date of Exam: 05/17/2024 Medical Rec #:  [id number]       Height:       59.0 in Accession #:    [id number]      Weight:       161.6 lb Date of Birth:  25-May-1971       BSA:          1.685 m Patient Age:    53 years        BP:           95/61 mmHg Patient  Gender: F               HR:           96 bpm. Exam Location:  Inpatient Procedure: 2D Echo, Cardiac Doppler and Color Doppler (Both Spectral and Color            Flow Doppler were utilized during procedure). Indications:    Chest Pain  History:        Patient has prior history of Echocardiogram examinations, most                 recent 04/05/2023. Risk Factors:Hypertension.  Sonographer:    Philomena Daring Referring Phys: LAMAR DESS IMPRESSIONS  1. Left ventricular ejection fraction, by estimation, is 60 to 65%. The left ventricle has normal function. The left ventricle demonstrates regional wall motion abnormalities, lateral wall appears to be hypokinetic. Consider Limited Echo with contrast for evaluation of RWMA. Left ventricular diastolic parameters are  consistent with Grade I diastolic dysfunction (impaired relaxation).  2. Right ventricular systolic function is normal. The right ventricular size is normal. There is normal pulmonary artery systolic pressure.  3. The mitral valve is normal in structure. Mild mitral valve regurgitation. No evidence of mitral stenosis.  4. The aortic valve is normal in structure. Aortic valve regurgitation is not visualized. No aortic stenosis is present.  5. The inferior vena cava is normal in size with greater than 50% respiratory variability, suggesting right atrial pressure of 3 mmHg. Comparison(s): Changes from prior study are noted. FINDINGS  Left Ventricle: Left ventricular ejection fraction, by estimation, is 60 to 65%. The left ventricle has normal function. The left ventricle demonstrates regional wall motion abnormalities. Strain was performed and the global longitudinal strain is indeterminate. The left ventricular internal cavity size was normal in size. There is no left ventricular hypertrophy. Left ventricular diastolic parameters are consistent with Grade I diastolic dysfunction (impaired relaxation). Normal left ventricular filling pressure.  LV Wall Scoring: The entire lateral wall is hypokinetic. The entire septum and apex are normal. Right Ventricle: The right ventricular size is normal. No increase in right ventricular wall thickness. Right ventricular systolic function is normal. There is normal pulmonary artery systolic pressure. The tricuspid regurgitant velocity is 2.17 m/s, and  with an assumed right atrial pressure of 3 mmHg, the estimated right ventricular systolic pressure is 21.8 mmHg. Left Atrium: Left atrial size was normal in size. Right Atrium: Right atrial size was normal in size. Pericardium: There is no evidence of pericardial effusion. Mitral Valve: The mitral valve is normal in structure. Mild mitral valve regurgitation. No evidence of mitral valve stenosis. Tricuspid Valve: The tricuspid valve  is normal in structure. Tricuspid valve regurgitation is mild . No evidence of tricuspid stenosis. Aortic Valve: The aortic valve is normal in structure. Aortic valve regurgitation is not visualized. No aortic stenosis is present. Pulmonic Valve: The pulmonic valve was thickened with good excursion. Pulmonic valve regurgitation is trivial. No evidence of pulmonic stenosis. Aorta: The aortic root and ascending aorta are structurally normal, with no evidence of dilitation. Venous: The inferior vena cava is normal in size with greater than 50% respiratory variability, suggesting right atrial pressure of 3 mmHg. IAS/Shunts: No atrial level shunt detected by color flow Doppler. Additional Comments: 3D was performed not requiring image post processing on an independent workstation and was indeterminate.  LEFT VENTRICLE PLAX 2D LVIDd:         5.20 cm   Diastology LVIDs:  3.30 cm   LV e' medial:    8.49 cm/s LV PW:         1.00 cm   LV E/e' medial:  11.6 LV IVS:        1.00 cm   LV e' lateral:   10.60 cm/s LVOT diam:     2.10 cm   LV E/e' lateral: 9.3 LV SV:         72 LV SV Index:   43 LVOT Area:     3.46 cm LV IVRT:       74 msec  RIGHT VENTRICLE             IVC RV Basal diam:  3.10 cm     IVC diam: 1.40 cm RV Mid diam:    2.40 cm RV S prime:     17.70 cm/s  PULMONARY VEINS TAPSE (M-mode): 1.8 cm      Diastolic Velocity: 50.30 cm/s                             S/D Velocity:       1.60                             Systolic Velocity:  81.80 cm/s LEFT ATRIUM             Index        RIGHT ATRIUM           Index LA diam:        3.00 cm 1.78 cm/m   RA Area:     11.60 cm LA Vol (A2C):   42.0 ml 24.93 ml/m  RA Volume:   24.50 ml  14.54 ml/m LA Vol (A4C):   34.4 ml 20.42 ml/m LA Biplane Vol: 38.1 ml 22.62 ml/m  AORTIC VALVE LVOT Vmax:   121.00 cm/s LVOT Vmean:  79.100 cm/s LVOT VTI:    0.207 m  AORTA Ao Root diam: 2.60 cm Ao Asc diam:  3.00 cm MITRAL VALVE               TRICUSPID VALVE MV Area (PHT): 5.20 cm    TR  Peak grad:   18.8 mmHg MV Decel Time: 146 msec    TR Vmax:        217.00 cm/s MV E velocity: 98.50 cm/s MV A velocity: 85.40 cm/s  SHUNTS MV E/A ratio:  1.15        Systemic VTI:  0.21 m                            Systemic Diam: 2.10 cm Vishnu Priya Mallipeddi Electronically signed by Diannah Late Mallipeddi Signature Date/Time: 05/17/2024/2:00:54 PM    Final        Assessment / Plan:    #21 53 year old female who was admitted 3 days ago with complaints of nausea vomiting and diarrhea.  Had initially presented to [facility], and initial workup with elevated troponins and therefore transferred here.  This is in the setting of previous diagnosis of compensated EtOH related cirrhosis  Also found anemic and heme positive.  Patient had undergone EGD yesterday with finding of a benign-appearing distal esophageal stricture which was balloon dilated to 13. 5 mm, also noted to have multiple superficial gastric ulcers with no stigmata of bleeding and a more cratered appearing prepyloric ulcer without  stigmata of bleeding.  Biopsies were taken and are pending.   Patient was to be discharged today however she had an episode of vomiting this morning, she reports 3 episodes of emesis all appearing dark brown/black with specks.  No bright red blood. Patient reports normal-appearing bowel movement last night.  Follow-up hemoglobin today stable actually improved since yesterday.  Etiology of the coffee-ground emesis may be secondary to bleeding post esophageal dilation yesterday, bleeding from biopsy site and less likely from the superficial gastric ulcers. This appears low-grade as her hemoglobin is stable/improved.  Plan; clear liquid diet IV PPI twice daily IV Zofran  every 6 hours as needed for nausea and vomiting Continue to trend her hemoglobin to ensure stabilization She does not need repeat EGD at this time, GI will follow with you, should she have more active bleeding or drop in hemoglobin then  repeat EGD can be considered.     Principal Problem:   GI bleed Active Problems:   Esophageal stricture   Chronic gastric ulcer with hemorrhage     LOS: 3 days   Atina Feeley EsterwoodPA-C  05/19/2024, 11:42 AM

## 2024-05-19 NOTE — Discharge Summary (Addendum)
 Alyssa Carey FMW:[id number] DOB: 12-30-1970 DOA: 05/16/2024  PCP: [name], MD  Admit date: 05/16/2024  Discharge date: 05/26/2024  Admitted From: Home   Disposition:  Home   Recommendations for Outpatient Follow-up:   Follow up with PCP in 1-2 weeks  PCP Please obtain BMP/CBC, 2 view CXR in 1week,  (see Discharge instructions)   PCP Please follow up on the following pending results: Mildly check CBC, BMP, magnesium  in 3 to 4 days, please arrange for prompt outpatient GI and cardiology follow-up.   Home Health: None   Equipment/Devices: None  Consultations: GI, cardiology over the phone Discharge Condition: Stable    CODE STATUS: Full    Diet Recommendation: Heart Healthy   Diet Order             DIET SOFT Fluid consistency: Thin  Diet effective now           Diet - low sodium heart healthy                    Chief Complaint  Patient presents with   GI Problem   Code STEMI     Brief history of present illness from the day of admission and additional interim summary    53 y.o. female with medical history significant of alcoholic hepatitis, hyperlipidemia, hypertension, a flutter who presented with 6 days of nausea vomiting diarrhea.  Patient was at [facility] and numerous reported sick contacts.  She thought she had a gastrointestinal illness.  She presented to the ER where she was found to be afebrile and hemodynamically stable.  She was found to have hypokalemia, hypomagnesemia and elevated LFTs so she was admitted for further workup.  Her electrolytes were repleted.  At the outside hospital she was found to have elevated troponins with ST changes on EKG.  Cardiology was consulted and patient was recommended to start dual antiplatelet.  Patient was referred for outpatient follow-up  however bloody stools and acute blood loss anemia was transferred for further management.                                                                  Hospital Course   and folic acid  supplements, history of chronic loose stools takes Imodium  and cholestyramine  as needed.  Now upper GI bleed, EGD showing gastric ulcers and esophageal stricture requiring dilation.    She has been treated with supportive care, on IV PPI, requested to stop using NSAIDs, seen by GI underwent EGD showing esophageal stricture and nonbleeding gastric ulcers, she was actually discharged on 05/19/2024 after which she had another episode of hematemesis, unfortunately she is still having melanotic stools on 05/21/2024, received 2 units of packed RBC on 05/21/2024, he was doing well unfortunately another melanotic stool morning of 05/23/2024 with  mild drop in H&H.   She underwent repeat EGD on 05/24/2024 showing Multiple gastric ulcers. 2 gastric ulcers one with visible vessel and one with pigmented material. Vessel treated with a monopolar probe and  clip placement x 3. Pigmented spot treated with  clip placement x 1. Clips (MR conditional) were placed.  Continue to monitor CBC for another 24 hours.  No further bleeding thus far.  He is now symptom-free will be discharged on twice daily PPI with outpatient GI and PCP follow-up.   Random mildly elevated troponin at outlying ER.  Unclear reason why it was checked, he had no chest pain, no chest pain here, EKG shows changes suggestive of LVH, history of smoking, echo shows a preserved EF but does have wall motion abnormality.  Troponin was negative, case discussed with cardiologist Dr. Walton who has cleared her for EGD along with outpatient cardiology follow-up.  No inpatient testing.  If cleared by GI will place her on a beta-blocker, statin and baby aspirin  with outpatient cardiology follow-up.  Mild sore throat after EGD.  No stridor, no dysphagia or odynophagia, stable exam,  wants narcotics which are not indicated, 1 dose of Decadron , Cepacol lozenge, Tylenol , resolved  Essential hypertension.  Placed on Coreg and hydralazine  PCP to monitor  Hypokalemia and hypomagnesemia.  Replaced.  Dyslipidemia.  Placed on Crestor for LDL to be at goal PCP to monitor.  Discharge diagnosis     Principal Problem:   GI bleed Active Problems:   Esophageal stricture   Chronic gastric ulcer with hemorrhage   Acute gastric ulcer with hemorrhage   Occult GI bleeding    Discharge instructions    Discharge Instructions     Diet - low sodium heart healthy   Complete by: As directed    Discharge instructions   Complete by: As directed    Follow with Primary MD [name], MD in 3 days, follow-up with the recommended cardiologist and gastroenterologist in 2 to 3 weeks.  Get CBC, CMP, Magnesium  -  checked next visit with your primary MD    Activity: As tolerated with Full fall precautions use walker/cane & assistance as needed  Disposition Home    Diet: Heart Healthy    Special Instructions: If you have smoked or chewed Tobacco  in the last 2 yrs please stop smoking, stop any regular Alcohol  and or any Recreational drug use.  On your next visit with your primary care physician please Get Medicines reviewed and adjusted.  Please request your Prim.MD to go over all Hospital Tests and Procedure/Radiological results at the follow up, please get all Hospital records sent to your Prim MD by signing hospital release before you go home.  If you experience worsening of your admission symptoms, develop shortness of breath, life threatening emergency, suicidal or homicidal thoughts you must seek medical attention immediately by calling 911 or calling your MD immediately  if symptoms less severe.  You Must read complete instructions/literature along with all the possible adverse reactions/side effects for all the Medicines you take and that have been prescribed to you. Take  any new Medicines after you have completely understood and accpet all the possible adverse reactions/side effects.   Do not drive when taking Pain medications.  Do not take more than prescribed Pain, Sleep and Anxiety Medications  Wear Seat belts while driving.   Increase activity slowly   Complete by: As directed        Discharge Medications   Allergies as of 05/26/2024  Reactions   Pantoprazole     Stomach pain   Penicillins Hives   Oxycodone  Hcl Rash        Medication List     STOP taking these medications    doxycycline  100 MG capsule Commonly known as: VIBRAMYCIN    ibuprofen  800 MG tablet Commonly known as: ADVIL    methylPREDNISolone  4 MG Tbpk tablet Commonly known as: MEDROL  DOSEPAK       TAKE these medications    acetaminophen  325 MG tablet Commonly known as: TYLENOL  Take 2 tablets (650 mg total) by mouth every 6 (six) hours as needed for mild pain (pain score 1-3).   ascorbic acid  500 MG tablet Commonly known as: VITAMIN C  Take 500 mg by mouth every other day.   carvedilol 6.25 MG tablet Commonly known as: COREG Take 1 tablet (6.25 mg total) by mouth 2 (two) times daily with a meal.   cholestyramine  4 g packet Commonly known as: QUESTRAN  Take 1 packet (4 g total) by mouth daily as needed (diarrhea).   cyclobenzaprine 10 MG tablet Commonly known as: FLEXERIL Take 10 mg by mouth every 6 (six) hours as needed for muscle spasms.   ferrous sulfate  325 (65 FE) MG EC tablet Take 1 tablet (325 mg total) by mouth daily with breakfast.   fluticasone  50 MCG/ACT nasal spray Commonly known as: FLONASE  Place 2 sprays into both nostrils as needed for allergies or rhinitis.   folic acid  1 MG tablet Commonly known as: FOLVITE  Take 1 tablet (1 mg total) by mouth daily.   gabapentin  100 MG capsule Commonly known as: NEURONTIN  Take 1 capsule (100 mg total) by mouth at bedtime as needed (pain).   Gerhardt's butt cream Crea Apply 1 Application  topically 4 (four) times daily as needed for irritation.   hydrALAZINE  50 MG tablet Commonly known as: APRESOLINE  Take 1 tablet (50 mg total) by mouth every 8 (eight) hours.   hydrocortisone  2.5 % rectal cream Commonly known as: ANUSOL -HC Place 1 Application rectally as needed for hemorrhoids or anal itching.   hydrOXYzine  25 MG tablet Commonly known as: ATARAX  Take 1 tablet (25 mg total) by mouth as needed for itching. What changed: when to take this   loperamide  2 MG capsule Commonly known as: IMODIUM  Take 2 mg by mouth 4 (four) times daily as needed for diarrhea or loose stools.   loratadine  10 MG tablet Commonly known as: CLARITIN  Take 1 tablet (10 mg total) by mouth daily. What changed:  when to take this reasons to take this   menthol  3 MG lozenge Commonly known as: CEPACOL Take 1 lozenge (3 mg total) by mouth every 8 (eight) hours as needed for sore throat.   montelukast  10 MG tablet Commonly known as: SINGULAIR  Take 1 tablet (10 mg total) by mouth at bedtime. What changed:  when to take this reasons to take this   ondansetron  4 MG disintegrating tablet Commonly known as: ZOFRAN -ODT Take 4 mg by mouth every 8 (eight) hours as needed for nausea or vomiting.   pantoprazole  40 MG tablet Commonly known as: PROTONIX  Take 1 tablet (40 mg total) by mouth 2 (two) times daily.   potassium chloride  10 MEQ CR capsule Commonly known as: MICRO-K  Take 10 mEq by mouth 2 (two) times daily.   promethazine  25 MG tablet Commonly known as: PHENERGAN  Take 25 mg by mouth every 8 (eight) hours as needed for nausea or vomiting.   rosuvastatin 10 MG tablet Commonly known as: Crestor Take 1 tablet (10 mg  total) by mouth daily.         Follow-up Information     [name], MD. Schedule an appointment as soon as possible for a visit in 3 day(s).   Specialty: Family Medicine Contact information: [address] [place] [address] [phone]         Stacia Glendia BRAVO, MD. Schedule an appointment as soon as possible for a visit in 1 week(s).   Specialty: Gastroenterology Contact information: [address] [place] [address] [phone]         Jeffrie Oneil BROCKS, MD. Schedule an appointment as soon as possible for a visit in 2 week(s).   Specialty: Cardiology Why: Abnormal echocardiogram Contact information: [address] [place] [address] [phone]                 Major procedures and Radiology Reports - PLEASE review detailed and final reports thoroughly  -     Repeat EGD 05/24/2024.     Impression:               - Normal esophagus.                           - Multiple gastric ulcers. 2 gastric ulcers one                            with visible vessel and one with pigmented                            material. Vessel treated with a monopolar probe and                            clip placement x 3. Pigmented spot treated with                            clip placement x 1. Clips (MR conditional) were                            placed.                           - Normal examined duodenum.                           - The examined portion of the jejunum was normal.                           - No specimens collected. Moderate Sedation:      N/A Recommendation:           - Return patient to hospital ward for ongoing care.                           - BID PPI.                           - No ASA or NSAIDs.                           -  Follow-up IHC stain for H. Pylori status from                            gastric biopsies done on 05/18/24.                           - Soft diet.                           - Monitor Hgb.     1st EGD 05/18/2024  Impression:               - Benign-appearing esophageal stenosis. Dilated. No                            evidence of esophageal varices.                           - Non-bleeding gastric ulcers with no stigmata of                            bleeding. Biopsied.                            - 2 cm hiatal hernia.                           - Normal examined duodenum.                           - Biopsies were taken with a cold forceps for                            evaluation of eosinophilic esophagitis. Moderate Sedation:      N/A Recommendation:           - Return patient to hospital ward for ongoing care.                           - Resume regular diet.                           - Continue Protonix  (pantoprazole ) 40 mg IV twice                            daily. She will need twice daily PO Protonix  for 8                            weeks, followed once daily PPI indefinitely                           - Repeat upper endoscopy in 8 weeks to check                            healing.                           -  Await pathology results.   ECHOCARDIOGRAM COMPLETE Result Date: 05/17/2024    ECHOCARDIOGRAM REPORT   Patient Name:   Alyssa Carey Date of Exam: 05/17/2024 Medical Rec #:  [id number]       Height:       59.0 in Accession #:    [id number]      Weight:       161.6 lb Date of Birth:  1971/02/15       BSA:          1.685 m Patient Age:    53 years        BP:           95/61 mmHg Patient Gender: F               HR:           96 bpm. Exam Location:  Inpatient Procedure: 2D Echo, Cardiac Doppler and Color Doppler (Both Spectral and Color            Flow Doppler were utilized during procedure). Indications:    Chest Pain  History:        Patient has prior history of Echocardiogram examinations, most                 recent 04/05/2023. Risk Factors:Hypertension.  Sonographer:    Philomena Daring Referring Phys: LAMAR DESS IMPRESSIONS  1. Left ventricular ejection fraction, by estimation, is 60 to 65%. The left ventricle has normal function. The left ventricle demonstrates regional wall motion abnormalities, lateral wall appears to be hypokinetic. Consider Limited Echo with contrast for evaluation of RWMA. Left ventricular diastolic parameters are consistent with Grade I diastolic  dysfunction (impaired relaxation).  2. Right ventricular systolic function is normal. The right ventricular size is normal. There is normal pulmonary artery systolic pressure.  3. The mitral valve is normal in structure. Mild mitral valve regurgitation. No evidence of mitral stenosis.  4. The aortic valve is normal in structure. Aortic valve regurgitation is not visualized. No aortic stenosis is present.  5. The inferior vena cava is normal in size with greater than 50% respiratory variability, suggesting right atrial pressure of 3 mmHg. Comparison(s): Changes from prior study are noted. FINDINGS  Left Ventricle: Left ventricular ejection fraction, by estimation, is 60 to 65%. The left ventricle has normal function. The left ventricle demonstrates regional wall motion abnormalities. Strain was performed and the global longitudinal strain is indeterminate. The left ventricular internal cavity size was normal in size. There is no left ventricular hypertrophy. Left ventricular diastolic parameters are consistent with Grade I diastolic dysfunction (impaired relaxation). Normal left ventricular filling pressure.  LV Wall Scoring: The entire lateral wall is hypokinetic. The entire septum and apex are normal. Right Ventricle: The right ventricular size is normal. No increase in right ventricular wall thickness. Right ventricular systolic function is normal. There is normal pulmonary artery systolic pressure. The tricuspid regurgitant velocity is 2.17 m/s, and  with an assumed right atrial pressure of 3 mmHg, the estimated right ventricular systolic pressure is 21.8 mmHg. Left Atrium: Left atrial size was normal in size. Right Atrium: Right atrial size was normal in size. Pericardium: There is no evidence of pericardial effusion. Mitral Valve: The mitral valve is normal in structure. Mild mitral valve regurgitation. No evidence of mitral valve stenosis. Tricuspid Valve: The tricuspid valve is normal in structure. Tricuspid  valve regurgitation is mild . No evidence of tricuspid stenosis. Aortic Valve: The aortic valve is normal in structure. Aortic  valve regurgitation is not visualized. No aortic stenosis is present. Pulmonic Valve: The pulmonic valve was thickened with good excursion. Pulmonic valve regurgitation is trivial. No evidence of pulmonic stenosis. Aorta: The aortic root and ascending aorta are structurally normal, with no evidence of dilitation. Venous: The inferior vena cava is normal in size with greater than 50% respiratory variability, suggesting right atrial pressure of 3 mmHg. IAS/Shunts: No atrial level shunt detected by color flow Doppler. Additional Comments: 3D was performed not requiring image post processing on an independent workstation and was indeterminate.  LEFT VENTRICLE PLAX 2D LVIDd:         5.20 cm   Diastology LVIDs:         3.30 cm   LV e' medial:    8.49 cm/s LV PW:         1.00 cm   LV E/e' medial:  11.6 LV IVS:        1.00 cm   LV e' lateral:   10.60 cm/s LVOT diam:     2.10 cm   LV E/e' lateral: 9.3 LV SV:         72 LV SV Index:   43 LVOT Area:     3.46 cm LV IVRT:       74 msec  RIGHT VENTRICLE             IVC RV Basal diam:  3.10 cm     IVC diam: 1.40 cm RV Mid diam:    2.40 cm RV S prime:     17.70 cm/s  PULMONARY VEINS TAPSE (M-mode): 1.8 cm      Diastolic Velocity: 50.30 cm/s                             S/D Velocity:       1.60                             Systolic Velocity:  81.80 cm/s LEFT ATRIUM             Index        RIGHT ATRIUM           Index LA diam:        3.00 cm 1.78 cm/m   RA Area:     11.60 cm LA Vol (A2C):   42.0 ml 24.93 ml/m  RA Volume:   24.50 ml  14.54 ml/m LA Vol (A4C):   34.4 ml 20.42 ml/m LA Biplane Vol: 38.1 ml 22.62 ml/m  AORTIC VALVE LVOT Vmax:   121.00 cm/s LVOT Vmean:  79.100 cm/s LVOT VTI:    0.207 m  AORTA Ao Root diam: 2.60 cm Ao Asc diam:  3.00 cm MITRAL VALVE               TRICUSPID VALVE MV Area (PHT): 5.20 cm    TR Peak grad:   18.8 mmHg MV Decel  Time: 146 msec    TR Vmax:        217.00 cm/s MV E velocity: 98.50 cm/s MV A velocity: 85.40 cm/s  SHUNTS MV E/A ratio:  1.15        Systemic VTI:  0.21 m                            Systemic Diam: 2.10 cm Vishnu Priya Mallipeddi Electronically signed  by Diannah Late Mallipeddi Signature Date/Time: 05/17/2024/2:00:54 PM    Final     Micro Results   Recent Results (from the past 240 hours)  MRSA Next Gen by PCR, Nasal     Status: None   Collection Time: 05/17/24  3:04 AM   Specimen: Nasal Mucosa; Nasal Swab  Result Value Ref Range Status   MRSA by PCR Next Gen NOT DETECTED NOT DETECTED Final    Comment: (NOTE) The GeneXpert MRSA Assay (FDA approved for NASAL specimens only), is one component of a comprehensive MRSA colonization surveillance program. It is not intended to diagnose MRSA infection nor to guide or monitor treatment for MRSA infections. Test performance is not FDA approved in patients less than 19 years old. Performed at [facility], [address] [place], [place] [address]     Today   Subjective    Alyssa Carey today has no headache,no chest abdominal pain,no new weakness tingling or numbness, feels much better wants to go home today.    Objective   Blood pressure 136/87, pulse 90, temperature 98 F (36.7 C), temperature source Oral, resp. rate 18, height 4' 11 (1.499 m), weight 73.3 kg, SpO2 98%.   Intake/Output Summary (Last 24 hours) at 05/26/2024 0747 Last data filed at 05/25/2024 2154 Gross per 24 hour  Intake 240 ml  Output --  Net 240 ml    Exam  Awake Alert, No new F.N deficits,    [place].AT,PERRAL Supple Neck,   Symmetrical Chest wall movement, Good air movement bilaterally, CTAB RRR,No Gallops,   +ve B.Sounds, Abd Soft, Non tender,  No Cyanosis, Clubbing or edema    Data Review   Recent Labs  Lab 05/22/24 0557 05/22/24 1018 05/23/24 0337 05/23/24 0929 05/23/24 1810 05/24/24 0423 05/25/24 0418 05/26/24 0354  WBC 4.7   < > 4.9  4.2 5.4 4.3 3.9* 3.8*  HGB 9.7*   < > 8.6* 9.0* 11.1* 9.1* 8.5* 8.8*  HCT 29.0*   < > 25.6* 27.6* 34.3* 28.1* 26.3* 27.9*  PLT 156   < > 163 171 181 196 201 217  MCV 87.6   < > 86.8 88.7 89.8 88.9 89.8 91.5  MCH 29.3   < > 29.2 28.9 29.1 28.8 29.0 28.9  MCHC 33.4   < > 33.6 32.6 32.4 32.4 32.3 31.5  RDW 20.8*   < > 19.9* 20.1* 20.2* 19.7* 19.1* 18.6*  LYMPHSABS 1.1  --  1.4  --   --  1.6 1.5 1.5  MONOABS 0.4  --  0.7  --   --  0.5 0.5 0.5  EOSABS 0.1  --  0.1  --   --  0.1 0.1 0.1  BASOSABS 0.0  --  0.0  --   --  0.0 0.0 0.0   < > = values in this interval not displayed.    Recent Labs  Lab 05/20/24 0359 05/21/24 0226 05/22/24 0313 05/23/24 0337 05/24/24 0423  NA 129* 131* 131* 130* 135  K 4.9 3.7 3.6 3.6 3.9  CL 98 101 102 100 104  CO2 20* 19* 18* 20* 21*  ANIONGAP 11 11 11 10 10   GLUCOSE 98 67* 65* 103* 100*  BUN 18 12 6  <5* 7  CREATININE 0.59 0.64 0.56 0.47 0.64  AST 72* 58* 68*  --   --   ALT 21 17 17   --   --   ALKPHOS 127* 105 112  --   --   BILITOT 1.3* 0.9 1.5*  --   --  ALBUMIN  2.8* 2.6* 2.7*  --   --   MG  --   --   --  1.3* 1.7  CALCIUM 8.4* 8.2* 8.2* 8.2* 8.5*   Lab Results  Component Value Date   CHOL 194 05/19/2024   HDL 85 05/19/2024   LDLCALC 94 05/19/2024   TRIG 77 05/19/2024   CHOLHDL 2.3 05/19/2024    Total Time in preparing paper work, data evaluation and todays exam - 35 minutes  Signature  -    Lavada Stank M.D on 05/26/2024 at 7:47 AM   -  To page go to [email or web address]

## 2024-05-19 NOTE — Plan of Care (Signed)
  Problem: Education: Goal: Knowledge of General Education information will improve Description: Including pain rating scale, medication(s)/side effects and non-pharmacologic comfort measures Outcome: Progressing   Problem: Health Behavior/Discharge Planning: Goal: Ability to manage health-related needs will improve Outcome: Progressing   Problem: Clinical Measurements: Goal: Will remain free from infection Outcome: Progressing Goal: Diagnostic test results will improve Outcome: Progressing   Problem: Coping: Goal: Level of anxiety will decrease Outcome: Progressing   Problem: Elimination: Goal: Will not experience complications related to urinary retention Outcome: Progressing   Problem: Pain Managment: Goal: General experience of comfort will improve and/or be controlled Outcome: Progressing   Problem: Safety: Goal: Ability to remain free from injury will improve Outcome: Progressing

## 2024-05-20 ENCOUNTER — Other Ambulatory Visit (HOSPITAL_COMMUNITY): Payer: Self-pay

## 2024-05-20 LAB — CBC WITH DIFFERENTIAL/PLATELET
Abs Immature Granulocytes: 0.04 K/uL (ref 0.00–0.07)
Abs Immature Granulocytes: 0.03 K/uL (ref 0.00–0.07)
Basophils Absolute: 0 K/uL (ref 0.0–0.1)
Basophils Absolute: 0 K/uL (ref 0.0–0.1)
Basophils Relative: 0 %
Basophils Relative: 0 %
Eosinophils Absolute: 0 K/uL (ref 0.0–0.5)
Eosinophils Absolute: 0.1 K/uL (ref 0.0–0.5)
Eosinophils Relative: 0 %
Eosinophils Relative: 1 %
HCT: 23.2 % — ABNORMAL LOW (ref 36.0–46.0)
HCT: 23.4 % — ABNORMAL LOW (ref 36.0–46.0)
Hemoglobin: 7.6 g/dL — ABNORMAL LOW (ref 12.0–15.0)
Hemoglobin: 7.4 g/dL — ABNORMAL LOW (ref 12.0–15.0)
Immature Granulocytes: 1 %
Immature Granulocytes: 0 %
Lymphocytes Relative: 23 %
Lymphocytes Relative: 28 %
Lymphs Abs: 1.5 K/uL (ref 0.7–4.0)
Lymphs Abs: 2.3 K/uL (ref 0.7–4.0)
MCH: 30.8 pg (ref 26.0–34.0)
MCH: 31.1 pg (ref 26.0–34.0)
MCHC: 32.8 g/dL (ref 30.0–36.0)
MCHC: 31.6 g/dL (ref 30.0–36.0)
MCV: 93.9 fL (ref 80.0–100.0)
MCV: 98.3 fL (ref 80.0–100.0)
Monocytes Absolute: 0.4 K/uL (ref 0.1–1.0)
Monocytes Absolute: 0.5 K/uL (ref 0.1–1.0)
Monocytes Relative: 7 %
Monocytes Relative: 6 %
Neutro Abs: 4.5 K/uL (ref 1.7–7.7)
Neutro Abs: 5.4 K/uL (ref 1.7–7.7)
Neutrophils Relative %: 69 %
Neutrophils Relative %: 65 %
Platelets: 168 K/uL (ref 150–400)
Platelets: UNDETERMINED K/uL (ref 150–400)
RBC: 2.47 MIL/uL — ABNORMAL LOW (ref 3.87–5.11)
RBC: 2.38 MIL/uL — ABNORMAL LOW (ref 3.87–5.11)
RDW: 18.6 % — ABNORMAL HIGH (ref 11.5–15.5)
RDW: 18.6 % — ABNORMAL HIGH (ref 11.5–15.5)
WBC: 6.5 K/uL (ref 4.0–10.5)
WBC: 8.3 K/uL (ref 4.0–10.5)
nRBC: 0.3 % — ABNORMAL HIGH (ref 0.0–0.2)
nRBC: 0.2 % (ref 0.0–0.2)

## 2024-05-20 LAB — COMPREHENSIVE METABOLIC PANEL WITH GFR
ALT: 21 U/L (ref 0–44)
AST: 72 U/L — ABNORMAL HIGH (ref 15–41)
Albumin: 2.8 g/dL — ABNORMAL LOW (ref 3.5–5.0)
Alkaline Phosphatase: 127 U/L — ABNORMAL HIGH (ref 38–126)
Anion gap: 11 (ref 5–15)
BUN: 18 mg/dL (ref 6–20)
CO2: 20 mmol/L — ABNORMAL LOW (ref 22–32)
Calcium: 8.4 mg/dL — ABNORMAL LOW (ref 8.9–10.3)
Chloride: 98 mmol/L (ref 98–111)
Creatinine, Ser: 0.59 mg/dL (ref 0.44–1.00)
GFR, Estimated: >60 mL/min (ref >60)
Glucose, Bld: 98 mg/dL (ref 70–99)
Potassium: 4.9 mmol/L (ref 3.5–5.1)
Sodium: 129 mmol/L — ABNORMAL LOW (ref 135–145)
Total Bilirubin: 1.3 mg/dL — ABNORMAL HIGH (ref 0.0–1.2)
Total Protein: 5.5 g/dL — ABNORMAL LOW (ref 6.5–8.1)

## 2024-05-20 LAB — URIC ACID: Uric Acid, Serum: 3.4 mg/dL (ref 2.5–7.1)

## 2024-05-20 LAB — OSMOLALITY: Osmolality: 274 mosm/kg — ABNORMAL LOW (ref 275–295)

## 2024-05-20 MED ORDER — ROSUVASTATIN CALCIUM 5 MG PO TABS
10.0000 mg | ORAL_TABLET | Freq: Every day | ORAL | Status: DC
  Administered 2024-05-20 – 2024-05-25 (×6): 10 mg via ORAL
  Filled 2024-05-20 (×6): qty 2

## 2024-05-20 MED ORDER — LACTATED RINGERS IV SOLN: INTRAVENOUS | Status: DC

## 2024-05-20 NOTE — TOC CM/SW Note (Signed)
 Transition of Care [facility]) - Inpatient Brief Assessment   Patient Details  Name: CHEVI LIM MRN: [id number] Date of Birth: [birth year]  Transition of Care [facility]) CM/SW Contact:    Lauraine FORBES Saa, LCSWA Phone Number: 05/20/2024, 12:46 PM   Clinical Narrative:  12:46 PM Per chart review, patient resides at home with child(ren). Patient has a PCP but does not have insurance. CSW consulted financial counseling for further assistance. Patient does not have HH history. Patient has DME (RW) history. Patient has SNF history with [facility]. Patient's preferred pharmacy's are [facility] [address], [facility], and [facility]. No other TOC needs identified at this time. TOC will continue to follow.  Transition of Care Asessment: Insurance and Status: Selfpay Patient has primary care physician: Yes Home environment has been reviewed: Private Residence Prior level of function:: N/A Prior/Current Home Services: No current home services Social Drivers of Health Review: SDOH reviewed no interventions necessary Readmission risk has been reviewed: Yes Transition of care needs: no transition of care needs at this time (Currently Yellow 15%)

## 2024-05-20 NOTE — Progress Notes (Addendum)
 PROGRESS NOTE                                                                                                                                                                                                             Patient Demographics:    Alyssa Carey, is a 53 y.o. female, DOB - Nov 18, 1970, FMW:[id number]  Outpatient Primary MD for the patient is [name], MD    LOS - 4  Admit date - 05/16/2024    Chief Complaint  Patient presents with   GI Problem   Code STEMI       Brief Narrative (HPI from H&P)    53 y.o. female with medical history significant of alcoholic hepatitis, hyperlipidemia, hypertension, a flutter who presented with 6 days of nausea vomiting diarrhea.  Patient was at [facility] and numerous reported sick contacts.  She thought she had a gastrointestinal illness.  She presented to the ER where she was found to be afebrile and hemodynamically stable.  She was found to have hypokalemia, hypomagnesemia and elevated LFTs so she was admitted for further workup.  Her electrolytes were repleted.  At the outside hospital she was found to have elevated troponins with ST changes on EKG.  Cardiology was consulted and patient was recommended to start dual antiplatelet.  Patient was referred for outpatient follow-up however bloody stools and acute blood loss anemia was transferred for further management.    Subjective:   Patient in bed, appears comfortable, denies any headache, no fever, no chest pain or pressure, no shortness of breath , no abdominal pain. No focal weakness.  No blood in stool or black stool.   Assessment  & Plan :   Nausea vomiting and diarrhea, likely gastroenteritis.  History of chronic anemia on iron and folic acid  supplements, history of chronic loose stools takes Imodium  and cholestyramine  as needed.  Now upper GI bleed, EGD showing gastric ulcers and esophageal stricture requiring  dilation.   She has been treated with supportive care, on IV PPI, requested to stop using NSAIDs, seen by GI underwent EGD showing esophageal stricture and nonbleeding gastric ulcers, she was actually discharged on 05/19/2024 after which she had another episode of hematemesis.  Monitoring CBC, type screen, agreeable for transfusion if needed, GI continues to be on board.  Currently stable.  Continue to monitor.  This  morning no nausea, diarrhea has improved.    Random mildly elevated troponin at outlying ER.  Unclear reason why it was checked, he had no chest pain, no chest pain here, EKG shows changes suggestive of LVH, history of smoking, echo shows a preserved EF but does have wall motion abnormality.  Troponin was negative, case discussed with cardiologist Dr. Walton who has cleared her for EGD along with outpatient cardiology follow-up.  No inpatient testing.  If cleared by GI will place her on a beta-blocker, statin and baby aspirin  with outpatient cardiology follow-up.   Mild sore throat after EGD.  No stridor, no dysphagia or odynophagia, stable exam, wants narcotics which are not indicated, 1 dose of Decadron , Cepacol lozenge, Tylenol  and follow-up with PCP.   Essential hypertension.  Placed on Coreg PCP to monitor   Hypokalemia and hypomagnesemia.  Replaced.   Hyponatremia.  Gentle IV fluids, check urine osmolality and electrolytes, serum osmolality and uric acid.    Dyslipidemia.  Placed on Crestor for LDL to be at goal PCP to monitor.      Condition - Fair  Family Communication  :  None  Code Status :  Full  Consults  :  GI  PUD Prophylaxis : PPI   Procedures  :     EGD   Impression:               - Benign-appearing esophageal stenosis. Dilated. No                            evidence of esophageal varices.                           - Non-bleeding gastric ulcers with no stigmata of                            bleeding. Biopsied.                           - 2 cm hiatal  hernia.                           - Normal examined duodenum.                           - Biopsies were taken with a cold forceps for                            evaluation of eosinophilic esophagitis. Moderate Sedation:      N/A Recommendation:           - Return patient to hospital ward for ongoing care.                           - Resume regular diet.                           - Continue Protonix  (pantoprazole ) 40 mg IV twice                            daily. She will need twice daily PO Protonix  for 8  weeks, followed once daily PPI indefinitely                           - Repeat upper endoscopy in 8 weeks to check                            healing.                           - Await pathology results.   Echo -  1. Left ventricular ejection fraction, by estimation, is 60 to 65%. The left ventricle has normal function. The left ventricle demonstrates regional wall motion abnormalities, lateral wall appears to be hypokinetic. Consider Limited Echo with contrast for evaluation of RWMA. Left ventricular diastolic parameters are consistent with Grade I diastolic dysfunction (impaired relaxation).  2. Right ventricular systolic function is normal. The right ventricular size is normal. There is normal pulmonary artery systolic pressure.  3. The mitral valve is normal in structure. Mild mitral valve regurgitation. No evidence of mitral stenosis.  4. The aortic valve is normal in structure. Aortic valve regurgitation is not visualized. No aortic stenosis is present.  5. The inferior vena cava is normal in size with greater than 50% respiratory variability, suggesting right atrial pressure of 3 mmHg.       Disposition Plan  :    Status is: Inpatient   DVT Prophylaxis  :    SCDs Start: 05/16/24 2126    Lab Results  Component Value Date   PLT 168 05/20/2024    Diet :  Diet Order             Diet clear liquid Room service appropriate? Yes; Fluid consistency: Thin   Diet effective now           Diet - low sodium heart healthy                    Inpatient Medications  Scheduled Meds:  carvedilol  6.25 mg Oral BID WC   feeding supplement  237 mL Oral BID BM   ferrous sulfate   325 mg Oral Q breakfast   folic acid   1 mg Oral Daily   hydrALAZINE   50 mg Oral Q8H   pantoprazole  (PROTONIX ) IV  40 mg Intravenous Q12H   Continuous Infusions:  lactated ringers      PRN Meds:.acetaminophen  **OR** acetaminophen , cholestyramine , cyclobenzaprine, diphenhydrAMINE , HYDROcodone -acetaminophen , hydrocortisone , loperamide , menthol , naLOXone (NARCAN)  injection, ondansetron  **OR** ondansetron  (ZOFRAN ) IV, phenol, traZODone   Antibiotics  :    Anti-infectives (From admission, onward)    None         Objective:   Vitals:   05/19/24 2116 05/19/24 2316 05/20/24 0531 05/20/24 0857  BP: 118/82 113/72 128/85 103/68  Pulse:  87  (!) 103  Resp:  18  16  Temp:  98 F (36.7 C)  97.9 F (36.6 C)  TempSrc:  Oral  Oral  SpO2:  98%  100%  Weight:      Height:        Wt Readings from Last 3 Encounters:  05/16/24 73.3 kg  02/18/24 72.4 kg  01/04/24 73 kg     Intake/Output Summary (Last 24 hours) at 05/20/2024 0915 Last data filed at 05/19/2024 2309 Gross per 24 hour  Intake 1012.34 ml  Output --  Net 1012.34 ml     Physical Exam  Awake Alert,  No new F.N deficits, Normal affect [place].AT,PERRAL Supple Neck, No JVD,   Symmetrical Chest wall movement, Good air movement bilaterally, CTAB RRR,No Gallops,Rubs or new Murmurs,  +ve B.Sounds, Abd Soft, No tenderness,   No Cyanosis, Clubbing or edema       Data Review:    Recent Labs  Lab 05/17/24 0446 05/18/24 0442 05/19/24 0511 05/19/24 1406 05/19/24 2031 05/20/24 0359  WBC 3.7* 4.7 6.6 4.9 5.3 6.5  HGB 8.8* 8.3* 9.2* 8.6* 7.7* 7.6*  HCT 26.4* 25.1* 27.7* 26.5* 23.7* 23.2*  PLT 138* 137* 175 155 123* 168  MCV 93.0 94.0 93.9 95.0 94.8 93.9  MCH 31.0 31.1 31.2 30.8 30.8 30.8  MCHC 33.3  33.1 33.2 32.5 32.5 32.8  RDW 19.4* 19.3* 18.9* 18.6* 18.5* 18.6*  LYMPHSABS 0.8 1.3 2.1  --   --  1.5  MONOABS 0.2 0.3 0.5  --   --  0.4  EOSABS 0.0 0.1 0.1  --   --  0.0  BASOSABS 0.0 0.0 0.0  --   --  0.0    Recent Labs  Lab 05/16/24 2334 05/17/24 0446 05/18/24 0442 05/18/24 0459 05/19/24 0511 05/20/24 0359  NA  --  131* 133*  --  130* 129*  K  --  4.7 4.5  --  4.5 4.9  CL  --  100 101  --  98 98  CO2  --  19* 22  --  24 20*  ANIONGAP  --  12 10  --  8 11  GLUCOSE  --  132* 77  --  105* 98  BUN  --  6 6  --  13 18  CREATININE  --  0.59 0.64  --  0.61 0.59  AST  --   --  109*  --  102* 72*  ALT  --   --  25  --  26 21  ALKPHOS  --   --  138*  --  146* 127*  BILITOT  --   --  1.4*  --  1.5* 1.3*  ALBUMIN   --   --  2.7*  --  2.7* 2.8*  INR 1.0  --   --   --   --   --   HGBA1C  --   --  4.3*  --   --   --   MG  --  1.4*  --  1.7 1.6*  --   CALCIUM  --  8.3* 8.2*  --  8.7* 8.4*      Recent Labs  Lab 05/16/24 2334 05/17/24 0446 05/18/24 0442 05/18/24 0459 05/19/24 0511 05/20/24 0359  INR 1.0  --   --   --   --   --   HGBA1C  --   --  4.3*  --   --   --   MG  --  1.4*  --  1.7 1.6*  --   CALCIUM  --  8.3* 8.2*  --  8.7* 8.4*    --------------------------------------------------------------------------------------------------------------- Lab Results  Component Value Date   CHOL 194 05/19/2024   HDL 85 05/19/2024   LDLCALC 94 05/19/2024   TRIG 77 05/19/2024   CHOLHDL 2.3 05/19/2024    Lab Results  Component Value Date   HGBA1C 4.3 (L) 05/18/2024   No results for input(s): TSH, T4TOTAL, FREET4, T3FREE, THYROIDAB in the last 72 hours. No results for input(s): VITAMINB12, FOLATE, FERRITIN, TIBC, IRON, RETICCTPCT in the last 72 hours.  ------------------------------------------------------------------------------------------------------------------ Cardiac Enzymes No results for input(s):  CKMB, TROPONINI, MYOGLOBIN in the last  168 hours.  Invalid input(s): CK  Micro Results Recent Results (from the past 240 hours)  MRSA Next Gen by PCR, Nasal     Status: None   Collection Time: 05/17/24  3:04 AM   Specimen: Nasal Mucosa; Nasal Swab  Result Value Ref Range Status   MRSA by PCR Next Gen NOT DETECTED NOT DETECTED Final    Comment: (NOTE) The GeneXpert MRSA Assay (FDA approved for NASAL specimens only), is one component of a comprehensive MRSA colonization surveillance program. It is not intended to diagnose MRSA infection nor to guide or monitor treatment for MRSA infections. Test performance is not FDA approved in patients less than 51 years old. Performed at [facility], [address], [place], [place] [address]     Radiology Report  No results found.    Signature  -   Lavada Stank M.D on 05/20/2024 at 9:15 AM   -  To page go to [email or web address]

## 2024-05-20 NOTE — Plan of Care (Signed)
 Alyssa Carey

## 2024-05-20 NOTE — Progress Notes (Signed)
 Patient ID: Alyssa Carey, female   DOB: [birth year], 53 y.o.   MRN: [id number]    Progress Note   Subjective   Day # 4 CC; nausea, vomiting, coffee-ground emesis anemia in setting of NSTEMI  EGD 05/18/2024 with distal esophageal stricture that was dilated to 13.5 mm balloon, and noted multiple superficial gastric ulcers with no stigmata of active bleeding and more cratered appearing prepyloric ulcer with stigmata of bleeding.  Biopsies were taken-still pending  Patient had an episode of coffee-ground emesis yesterday a.m. and we were asked to become reinvolved. Hemoglobin 8.6> 7.7> 7.6 this a.m.  She does not have much of an appetite, has been complaining of some nausea but would like to try full liquids today she has not had any further vomiting, she did have a black stool last p.m.   Objective   Vital signs in last 24 hours: Temp:  [97.9 F (36.6 C)-98.2 F (36.8 C)] 97.9 F (36.6 C) (11/04 0857) Pulse Rate:  [87-103] 103 (11/04 0857) Resp:  [16-18] 16 (11/04 0857) BP: (103-148)/(68-98) 103/68 (11/04 0857) SpO2:  [98 %-100 %] 100 % (11/04 0857) Last BM Date : 05/19/24 General:    African-American female in NAD Heart:  Regular rate and rhythm; no murmurs Lungs: Respirations even and unlabored, lungs CTA bilaterally Abdomen:  Soft, nontender and nondistended. Normal bowel sounds. Extremities:  Without edema. Neurologic:  Alert and oriented,  grossly normal neurologically. Psych:  Cooperative. Normal mood and affect.  Intake/Output from previous day: 11/03 0701 - 11/04 0700 In: 1012.3 [P.O.:480; I.V.:532.3] Out: -  Intake/Output this shift: No intake/output data recorded.  Lab Results: Recent Labs    05/19/24 1406 05/19/24 2031 05/20/24 0359  WBC 4.9 5.3 6.5  HGB 8.6* 7.7* 7.6*  HCT 26.5* 23.7* 23.2*  PLT 155 123* 168   BMET Recent Labs    05/18/24 0442 05/19/24 0511 05/20/24 0359  NA 133* 130* 129*  K 4.5 4.5 4.9  CL 101 98 98  CO2 22 24 20*  GLUCOSE 77  105* 98  BUN 6 13 18   CREATININE 0.64 0.61 0.59  CALCIUM 8.2* 8.7* 8.4*   LFT Recent Labs    05/20/24 0359  PROT 5.5*  ALBUMIN  2.8*  AST 72*  ALT 21  ALKPHOS 127*  BILITOT 1.3*   PT/INR No results for input(s): LABPROT, INR in the last 72 hours.    Assessment / Plan:    #26 53 year old female who had presented 3 days ago with nausea vomiting, diarrhea and initial workup revealed elevated troponins and transferred here.  This is in the setting of previous diagnosis of compensated EtOH related cirrhosis. Also found to be anemic and heme positive  EGD on 11 2 with dilation of a distal esophageal stricture, multiple superficial gastric ulcers nonbleeding and 1 cratered prepyloric ulcer nonbleeding.-Biopsies were taken still pending  Patient had an episode of coffee-ground emesis yesterday morning-not clear if this was postbiopsy bleeding or perhaps bleeding after esophageal dilation. She has not had any evidence of any significant active bleeding though hemoglobin has drifted a bit Black stool last p.m. but no further emesis or coffee-ground emesis  #2 anemia secondary to GI blood loss #3 NSTEMI #4 history of hypertension #5 mild hyponatremia  Plan; advance to full liquid diet today, patient choice Twice daily PPI Continue to trend hemoglobin, consider transfusion for hemoglobin 7.5 or less given NSTEMI Plans for repeat EGD at present Follow-up biopsies GI will continue to follow along    Principal Problem:  GI bleed Active Problems:   Esophageal stricture   Chronic gastric ulcer with hemorrhage     LOS: 4 days   Tadashi Burkel EsterwoodPA-C  05/20/2024, 9:43 AM

## 2024-05-21 ENCOUNTER — Other Ambulatory Visit (HOSPITAL_COMMUNITY): Payer: Self-pay

## 2024-05-21 LAB — CBC WITH DIFFERENTIAL/PLATELET
Abs Immature Granulocytes: 0.03 K/uL (ref 0.00–0.07)
Basophils Absolute: 0 K/uL (ref 0.0–0.1)
Basophils Relative: 0 %
Eosinophils Absolute: 0.1 K/uL (ref 0.0–0.5)
Eosinophils Relative: 1 %
HCT: 17.3 % — ABNORMAL LOW (ref 36.0–46.0)
Hemoglobin: 5.7 g/dL — CL (ref 12.0–15.0)
Immature Granulocytes: 1 %
Lymphocytes Relative: 23 %
Lymphs Abs: 1.4 K/uL (ref 0.7–4.0)
MCH: 30.8 pg (ref 26.0–34.0)
MCHC: 32.9 g/dL (ref 30.0–36.0)
MCV: 93.5 fL (ref 80.0–100.0)
Monocytes Absolute: 0.5 K/uL (ref 0.1–1.0)
Monocytes Relative: 8 %
Neutro Abs: 4.1 K/uL (ref 1.7–7.7)
Neutrophils Relative %: 67 %
Platelets: 168 K/uL (ref 150–400)
RBC: 1.85 MIL/uL — ABNORMAL LOW (ref 3.87–5.11)
RDW: 18.5 % — ABNORMAL HIGH (ref 11.5–15.5)
WBC: 6.1 K/uL (ref 4.0–10.5)
nRBC: 0 % (ref 0.0–0.2)

## 2024-05-21 LAB — COMPREHENSIVE METABOLIC PANEL WITH GFR
ALT: 17 U/L (ref 0–44)
AST: 58 U/L — ABNORMAL HIGH (ref 15–41)
Albumin: 2.6 g/dL — ABNORMAL LOW (ref 3.5–5.0)
Alkaline Phosphatase: 105 U/L (ref 38–126)
Anion gap: 11 (ref 5–15)
BUN: 12 mg/dL (ref 6–20)
CO2: 19 mmol/L — ABNORMAL LOW (ref 22–32)
Calcium: 8.2 mg/dL — ABNORMAL LOW (ref 8.9–10.3)
Chloride: 101 mmol/L (ref 98–111)
Creatinine, Ser: 0.64 mg/dL (ref 0.44–1.00)
GFR, Estimated: >60 mL/min (ref >60)
Glucose, Bld: 67 mg/dL — ABNORMAL LOW (ref 70–99)
Potassium: 3.7 mmol/L (ref 3.5–5.1)
Sodium: 131 mmol/L — ABNORMAL LOW (ref 135–145)
Total Bilirubin: 0.9 mg/dL (ref 0.0–1.2)
Total Protein: 4.9 g/dL — ABNORMAL LOW (ref 6.5–8.1)

## 2024-05-21 LAB — CREATININE, URINE, RANDOM: Creatinine, Urine: 35 mg/dL

## 2024-05-21 LAB — PREPARE RBC (CROSSMATCH)

## 2024-05-21 LAB — SODIUM, URINE, RANDOM: Sodium, Ur: 55 mmol/L

## 2024-05-21 LAB — OSMOLALITY, URINE: Osmolality, Ur: 258 mosm/kg — ABNORMAL LOW (ref 300–900)

## 2024-05-21 MED ORDER — LACTATED RINGERS IV SOLN: INTRAVENOUS | Status: AC

## 2024-05-21 MED ORDER — SODIUM CHLORIDE 0.9% IV SOLUTION: Freq: Once | INTRAVENOUS | Status: AC

## 2024-05-21 NOTE — Progress Notes (Signed)
 PROGRESS NOTE                                                                                                                                                                                                             Patient Demographics:    Alyssa Carey, is a 53 y.o. female, DOB - [birth year], FMW:[id number]  Outpatient Primary MD for the patient is [name], MD    LOS - 5  Admit date - 05/16/2024    Chief Complaint  Patient presents with   GI Problem   Code STEMI       Brief Narrative (HPI from H&P)    53 y.o. female with medical history significant of alcoholic hepatitis, hyperlipidemia, hypertension, a flutter who presented with 6 days of nausea vomiting diarrhea.  Patient was at [facility] and numerous reported sick contacts.  She thought she had a gastrointestinal illness.  She presented to the ER where she was found to be afebrile and hemodynamically stable.  She was found to have hypokalemia, hypomagnesemia and elevated LFTs so she was admitted for further workup.  Her electrolytes were repleted.  At the outside hospital she was found to have elevated troponins with ST changes on EKG.  Cardiology was consulted and patient was recommended to start dual antiplatelet.  Patient was referred for outpatient follow-up however bloody stools and acute blood loss anemia was transferred for further management.    Subjective:   Patient in bed, appears comfortable, denies any headache, no fever, no chest pain or pressure, no shortness of breath , no abdominal pain. No focal weakness.  Having dark stools but no nausea or emesis.   Assessment  & Plan :   Nausea vomiting and diarrhea, likely gastroenteritis.  History of chronic anemia on iron and folic acid  supplements, history of chronic loose stools takes Imodium  and cholestyramine  as needed.  Now upper GI bleed, EGD showing gastric ulcers and esophageal stricture  requiring dilation.   She has been treated with supportive care, on IV PPI, requested to stop using NSAIDs, seen by GI underwent EGD showing esophageal stricture and nonbleeding gastric ulcers, she was actually discharged on 05/19/2024 after which she had another episode of hematemesis, unfortunately she is still having melanotic stools on 05/21/2024.  H&H is now dropping, getting 2 units of packed RBCs on 05/21/2024, n.p.o., IV  PPI.  GI on board.  Continue to monitor CBC.   Random mildly elevated troponin at outlying ER.  Unclear reason why it was checked, he had no chest pain, no chest pain here, EKG shows changes suggestive of LVH, history of smoking, echo shows a preserved EF but does have wall motion abnormality.  Troponin was negative, case discussed with cardiologist Dr. Walton who has cleared her for EGD along with outpatient cardiology follow-up.  No inpatient testing.  If cleared by GI will place her on a beta-blocker, statin and baby aspirin  with outpatient cardiology follow-up.   Mild sore throat after EGD.  No stridor, no dysphagia or odynophagia, stable exam, wants narcotics which are not indicated, 1 dose of Decadron , Cepacol lozenge, Tylenol  and follow-up with PCP.   Essential hypertension.  Placed on Coreg PCP to monitor   Hypokalemia and hypomagnesemia.  Replaced.   Hyponatremia.  Continue hydration.  Dyslipidemia.  Placed on Crestor for LDL to be at goal PCP to monitor.      Condition - Fair  Family Communication  :  None  Code Status :  Full  Consults  :  GI  PUD Prophylaxis : PPI   Procedures  :     EGD   Impression:               - Benign-appearing esophageal stenosis. Dilated. No                            evidence of esophageal varices.                           - Non-bleeding gastric ulcers with no stigmata of                            bleeding. Biopsied.                           - 2 cm hiatal hernia.                           - Normal examined  duodenum.                           - Biopsies were taken with a cold forceps for                            evaluation of eosinophilic esophagitis. Moderate Sedation:      N/A Recommendation:           - Return patient to hospital ward for ongoing care.                           - Resume regular diet.                           - Continue Protonix  (pantoprazole ) 40 mg IV twice                            daily. She will need twice daily PO Protonix  for 8  weeks, followed once daily PPI indefinitely                           - Repeat upper endoscopy in 8 weeks to check                            healing.                           - Await pathology results.   Echo -  1. Left ventricular ejection fraction, by estimation, is 60 to 65%. The left ventricle has normal function. The left ventricle demonstrates regional wall motion abnormalities, lateral wall appears to be hypokinetic. Consider Limited Echo with contrast for evaluation of RWMA. Left ventricular diastolic parameters are consistent with Grade I diastolic dysfunction (impaired relaxation).  2. Right ventricular systolic function is normal. The right ventricular size is normal. There is normal pulmonary artery systolic pressure.  3. The mitral valve is normal in structure. Mild mitral valve regurgitation. No evidence of mitral stenosis.  4. The aortic valve is normal in structure. Aortic valve regurgitation is not visualized. No aortic stenosis is present.  5. The inferior vena cava is normal in size with greater than 50% respiratory variability, suggesting right atrial pressure of 3 mmHg.       Disposition Plan  :    Status is: Inpatient   DVT Prophylaxis  :    SCDs Start: 05/16/24 2126    Lab Results  Component Value Date   PLT 168 05/21/2024    Diet :  Diet Order             Diet NPO time specified  Diet effective now           Diet - low sodium heart healthy                    Inpatient  Medications  Scheduled Meds:  sodium chloride    Intravenous Once   carvedilol  6.25 mg Oral BID WC   feeding supplement  237 mL Oral BID BM   ferrous sulfate   325 mg Oral Q breakfast   folic acid   1 mg Oral Daily   hydrALAZINE   50 mg Oral Q8H   pantoprazole  (PROTONIX ) IV  40 mg Intravenous Q12H   rosuvastatin  10 mg Oral Daily   Continuous Infusions:  lactated ringers  Stopped (05/21/24 0616)   PRN Meds:.acetaminophen  **OR** acetaminophen , cholestyramine , cyclobenzaprine, diphenhydrAMINE , HYDROcodone -acetaminophen , hydrocortisone , loperamide , menthol , naLOXone (NARCAN)  injection, ondansetron  **OR** ondansetron  (ZOFRAN ) IV, phenol, traZODone   Antibiotics  :    Anti-infectives (From admission, onward)    None         Objective:   Vitals:   05/21/24 0033 05/21/24 0550 05/21/24 0627 05/21/24 0658  BP: 94/75 120/84 122/85 115/82  Pulse: 95 (!) 106 (!) 109 (!) 108  Resp:  16 16 16   Temp: 99.7 F (37.6 C) 99.1 F (37.3 C) 99 F (37.2 C) 98.7 F (37.1 C)  TempSrc: Oral Oral Oral Oral  SpO2: 99% 100% 100% 100%  Weight:      Height:        Wt Readings from Last 3 Encounters:  05/16/24 73.3 kg  02/18/24 72.4 kg  01/04/24 73 kg     Intake/Output Summary (Last 24 hours) at 05/21/2024 0717 Last data filed at 05/20/2024 1600 Gross per 24 hour  Intake 660 ml  Output --  Net 660 ml     Physical Exam  Awake Alert, No new F.N deficits, Normal affect [place].AT,PERRAL Supple Neck, No JVD,   Symmetrical Chest wall movement, Good air movement bilaterally, CTAB RRR,No Gallops,Rubs or new Murmurs,  +ve B.Sounds, Abd Soft, No tenderness,   No Cyanosis, Clubbing or edema       Data Review:    Recent Labs  Lab 05/18/24 0442 05/19/24 0511 05/19/24 1406 05/19/24 2031 05/20/24 0359 05/20/24 1342 05/21/24 0226  WBC 4.7 6.6 4.9 5.3 6.5 8.3 6.1  HGB 8.3* 9.2* 8.6* 7.7* 7.6* 7.4* 5.7*  HCT 25.1* 27.7* 26.5* 23.7* 23.2* 23.4* 17.3*  PLT 137* 175 155 123* 168 PLATELET  CLUMPS NOTED ON SMEAR, UNABLE TO ESTIMATE 168  MCV 94.0 93.9 95.0 94.8 93.9 98.3 93.5  MCH 31.1 31.2 30.8 30.8 30.8 31.1 30.8  MCHC 33.1 33.2 32.5 32.5 32.8 31.6 32.9  RDW 19.3* 18.9* 18.6* 18.5* 18.6* 18.6* 18.5*  LYMPHSABS 1.3 2.1  --   --  1.5 2.3 1.4  MONOABS 0.3 0.5  --   --  0.4 0.5 0.5  EOSABS 0.1 0.1  --   --  0.0 0.1 0.1  BASOSABS 0.0 0.0  --   --  0.0 0.0 0.0    Recent Labs  Lab 05/16/24 2334 05/17/24 0446 05/18/24 0442 05/18/24 0459 05/19/24 0511 05/20/24 0359 05/21/24 0226  NA  --  131* 133*  --  130* 129* 131*  K  --  4.7 4.5  --  4.5 4.9 3.7  CL  --  100 101  --  98 98 101  CO2  --  19* 22  --  24 20* 19*  ANIONGAP  --  12 10  --  8 11 11   GLUCOSE  --  132* 77  --  105* 98 67*  BUN  --  6 6  --  13 18 12   CREATININE  --  0.59 0.64  --  0.61 0.59 0.64  AST  --   --  109*  --  102* 72* 58*  ALT  --   --  25  --  26 21 17   ALKPHOS  --   --  138*  --  146* 127* 105  BILITOT  --   --  1.4*  --  1.5* 1.3* 0.9  ALBUMIN   --   --  2.7*  --  2.7* 2.8* 2.6*  INR 1.0  --   --   --   --   --   --   HGBA1C  --   --  4.3*  --   --   --   --   MG  --  1.4*  --  1.7 1.6*  --   --   CALCIUM  --  8.3* 8.2*  --  8.7* 8.4* 8.2*      Recent Labs  Lab 05/16/24 2334 05/17/24 0446 05/18/24 0442 05/18/24 0459 05/19/24 0511 05/20/24 0359 05/21/24 0226  INR 1.0  --   --   --   --   --   --   HGBA1C  --   --  4.3*  --   --   --   --   MG  --  1.4*  --  1.7 1.6*  --   --   CALCIUM  --  8.3* 8.2*  --  8.7* 8.4* 8.2*    --------------------------------------------------------------------------------------------------------------- Lab Results  Component Value Date  CHOL 194 05/19/2024   HDL 85 05/19/2024   LDLCALC 94 05/19/2024   TRIG 77 05/19/2024   CHOLHDL 2.3 05/19/2024    Lab Results  Component Value Date   HGBA1C 4.3 (L) 05/18/2024   No results for input(s): TSH, T4TOTAL, FREET4, T3FREE, THYROIDAB in the last 72 hours. No results for input(s):  VITAMINB12, FOLATE, FERRITIN, TIBC, IRON, RETICCTPCT in the last 72 hours.  ------------------------------------------------------------------------------------------------------------------ Cardiac Enzymes No results for input(s): CKMB, TROPONINI, MYOGLOBIN in the last 168 hours.  Invalid input(s): CK  Micro Results Recent Results (from the past 240 hours)  MRSA Next Gen by PCR, Nasal     Status: None   Collection Time: 05/17/24  3:04 AM   Specimen: Nasal Mucosa; Nasal Swab  Result Value Ref Range Status   MRSA by PCR Next Gen NOT DETECTED NOT DETECTED Final    Comment: (NOTE) The GeneXpert MRSA Assay (FDA approved for NASAL specimens only), is one component of a comprehensive MRSA colonization surveillance program. It is not intended to diagnose MRSA infection nor to guide or monitor treatment for MRSA infections. Test performance is not FDA approved in patients less than 82 years old. Performed at [facility], [address], [place], [place] [address]     Radiology Report  No results found.    Signature  -   Lavada Stank M.D on 05/21/2024 at 7:17 AM   -  To page go to [email or web address]

## 2024-05-21 NOTE — Plan of Care (Signed)
  Problem: Clinical Measurements: Goal: Will remain free from infection Outcome: Progressing Goal: Diagnostic test results will improve Outcome: Progressing Goal: Cardiovascular complication will be avoided Outcome: Progressing   Problem: Coping: Goal: Level of anxiety will decrease Outcome: Progressing   Problem: Elimination: Goal: Will not experience complications related to bowel motility Outcome: Progressing   Problem: Pain Managment: Goal: General experience of comfort will improve and/or be controlled Outcome: Progressing

## 2024-05-21 NOTE — Progress Notes (Signed)
 [facility] night cross cover note:   I was notified by the patient's RN of the patient's hemoglobin level this morning of 5.7, down from most recent prior hemoglobin value of 7.4 when checked yesterday morning.  Heart rate in the 90s; systolic blood pressures overnight in the 90s to 1 teens mmHg, similar to blood pressures yesterday afternoon.   I ordered an updated type and screen on this patient.   I subsequently ordered transfusion of 1 unit prbc over 3 hours as well as ordered repeat H&H to be checked following completion of transfusion of this 1 unit prbc.     Eva Pore, DO Hospitalist

## 2024-05-21 NOTE — Plan of Care (Signed)
 Alyssa Carey

## 2024-05-22 ENCOUNTER — Other Ambulatory Visit (HOSPITAL_COMMUNITY): Payer: Self-pay

## 2024-05-22 LAB — CBC
HCT: 32.4 % — ABNORMAL LOW (ref 36.0–46.0)
Hemoglobin: 10.8 g/dL — ABNORMAL LOW (ref 12.0–15.0)
MCH: 29.4 pg (ref 26.0–34.0)
MCHC: 33.3 g/dL (ref 30.0–36.0)
MCV: 88.3 fL (ref 80.0–100.0)
Platelets: 190 K/uL (ref 150–400)
RBC: 3.67 MIL/uL — ABNORMAL LOW (ref 3.87–5.11)
RDW: 20.9 % — ABNORMAL HIGH (ref 11.5–15.5)
WBC: 6.3 K/uL (ref 4.0–10.5)
nRBC: 0 % (ref 0.0–0.2)

## 2024-05-22 LAB — CBC WITH DIFFERENTIAL/PLATELET
Abs Immature Granulocytes: 0.03 K/uL (ref 0.00–0.07)
Basophils Absolute: 0 K/uL (ref 0.0–0.1)
Basophils Relative: 0 %
Eosinophils Absolute: 0.1 K/uL (ref 0.0–0.5)
Eosinophils Relative: 1 %
HCT: 29 % — ABNORMAL LOW (ref 36.0–46.0)
Hemoglobin: 9.7 g/dL — ABNORMAL LOW (ref 12.0–15.0)
Immature Granulocytes: 1 %
Lymphocytes Relative: 24 %
Lymphs Abs: 1.1 K/uL (ref 0.7–4.0)
MCH: 29.3 pg (ref 26.0–34.0)
MCHC: 33.4 g/dL (ref 30.0–36.0)
MCV: 87.6 fL (ref 80.0–100.0)
Monocytes Absolute: 0.4 K/uL (ref 0.1–1.0)
Monocytes Relative: 9 %
Neutro Abs: 3 K/uL (ref 1.7–7.7)
Neutrophils Relative %: 65 %
Platelets: 156 K/uL (ref 150–400)
RBC: 3.31 MIL/uL — ABNORMAL LOW (ref 3.87–5.11)
RDW: 20.8 % — ABNORMAL HIGH (ref 11.5–15.5)
WBC: 4.7 K/uL (ref 4.0–10.5)
nRBC: 0 % (ref 0.0–0.2)

## 2024-05-22 LAB — COMPREHENSIVE METABOLIC PANEL WITH GFR
ALT: 17 U/L (ref 0–44)
AST: 68 U/L — ABNORMAL HIGH (ref 15–41)
Albumin: 2.7 g/dL — ABNORMAL LOW (ref 3.5–5.0)
Alkaline Phosphatase: 112 U/L (ref 38–126)
Anion gap: 11 (ref 5–15)
BUN: 6 mg/dL (ref 6–20)
CO2: 18 mmol/L — ABNORMAL LOW (ref 22–32)
Calcium: 8.2 mg/dL — ABNORMAL LOW (ref 8.9–10.3)
Chloride: 102 mmol/L (ref 98–111)
Creatinine, Ser: 0.56 mg/dL (ref 0.44–1.00)
GFR, Estimated: >60 mL/min (ref >60)
Glucose, Bld: 65 mg/dL — ABNORMAL LOW (ref 70–99)
Potassium: 3.6 mmol/L (ref 3.5–5.1)
Sodium: 131 mmol/L — ABNORMAL LOW (ref 135–145)
Total Bilirubin: 1.5 mg/dL — ABNORMAL HIGH (ref 0.0–1.2)
Total Protein: 5.1 g/dL — ABNORMAL LOW (ref 6.5–8.1)

## 2024-05-22 LAB — UREA NITROGEN, URINE: Urea Nitrogen, Ur: 286 mg/dL

## 2024-05-22 MED ORDER — PANTOPRAZOLE SODIUM 40 MG PO TBEC
40.0000 mg | DELAYED_RELEASE_TABLET | Freq: Two times a day (BID) | ORAL | Status: DC
  Administered 2024-05-22 – 2024-05-25 (×7): 40 mg via ORAL
  Filled 2024-05-22 (×7): qty 1

## 2024-05-22 NOTE — Plan of Care (Signed)

## 2024-05-22 NOTE — Progress Notes (Signed)
 PROGRESS NOTE                                                                                                                                                                                                             Patient Demographics:    Alyssa Carey, is a 53 y.o. female, DOB - 1970/09/14, FMW:[id number]  Outpatient Primary MD for the patient is [name], MD    LOS - 6  Admit date - 05/16/2024    Chief Complaint  Patient presents with   GI Problem   Code STEMI       Brief Narrative (HPI from H&P)    53 y.o. female with medical history significant of alcoholic hepatitis, hyperlipidemia, hypertension, a flutter who presented with 6 days of nausea vomiting diarrhea.  Patient was at [facility] and numerous reported sick contacts.  She thought she had a gastrointestinal illness.  She presented to the ER where she was found to be afebrile and hemodynamically stable.  She was found to have hypokalemia, hypomagnesemia and elevated LFTs so she was admitted for further workup.  Her electrolytes were repleted.  At the outside hospital she was found to have elevated troponins with ST changes on EKG.  Cardiology was consulted and patient was recommended to start dual antiplatelet.  Patient was referred for outpatient follow-up however bloody stools and acute blood loss anemia was transferred for further management.    Subjective:   Patient in bed, appears comfortable, denies any headache, no fever, no chest pain or pressure, no shortness of breath , no abdominal pain. No new focal weakness.  Stools are getting lighter in color, no nausea or vomiting.    Assessment  & Plan :   Nausea vomiting and diarrhea, likely gastroenteritis.  History of chronic anemia on iron and folic acid  supplements, history of chronic loose stools takes Imodium  and cholestyramine  as needed.  Now upper GI bleed, EGD showing gastric ulcers and  esophageal stricture requiring dilation.   She has been treated with supportive care, on IV PPI, requested to stop using NSAIDs, seen by GI underwent EGD showing esophageal stricture and nonbleeding gastric ulcers, she was actually discharged on 05/19/2024 after which she had another episode of hematemesis, unfortunately she is still having melanotic stools on 05/21/2024, received 2 units of packed RBC on 05/21/2024, posttransfusion H&H stable,  symptoms improved, advance diet and monitor.   Random mildly elevated troponin at outlying ER.  Unclear reason why it was checked, he had no chest pain, no chest pain here, EKG shows changes suggestive of LVH, history of smoking, echo shows a preserved EF but does have wall motion abnormality.  Troponin was negative, case discussed with cardiologist Dr. Walton who has cleared her for EGD along with outpatient cardiology follow-up.  No inpatient testing.  If cleared by GI will place her on a beta-blocker, statin and baby aspirin  with outpatient cardiology follow-up.   Mild sore throat after EGD.  No stridor, no dysphagia or odynophagia, stable exam, wants narcotics which are not indicated, 1 dose of Decadron , Cepacol lozenge, Tylenol  and follow-up with PCP.   Essential hypertension.  Placed on Coreg PCP to monitor   Hypokalemia and hypomagnesemia.  Replaced.   Hyponatremia.  Continue hydration.  Dyslipidemia.  Placed on Crestor for LDL to be at goal PCP to monitor.      Condition - Fair  Family Communication  :  None  Code Status :  Full  Consults  :  GI  PUD Prophylaxis : PPI   Procedures  :     EGD   Impression:               - Benign-appearing esophageal stenosis. Dilated. No                            evidence of esophageal varices.                           - Non-bleeding gastric ulcers with no stigmata of                            bleeding. Biopsied.                           - 2 cm hiatal hernia.                           - Normal  examined duodenum.                           - Biopsies were taken with a cold forceps for                            evaluation of eosinophilic esophagitis. Moderate Sedation:      N/A Recommendation:           - Return patient to hospital ward for ongoing care.                           - Resume regular diet.                           - Continue Protonix  (pantoprazole ) 40 mg IV twice                            daily. She will need twice daily PO Protonix  for 8  weeks, followed once daily PPI indefinitely                           - Repeat upper endoscopy in 8 weeks to check                            healing.                           - Await pathology results.   Echo -  1. Left ventricular ejection fraction, by estimation, is 60 to 65%. The left ventricle has normal function. The left ventricle demonstrates regional wall motion abnormalities, lateral wall appears to be hypokinetic. Consider Limited Echo with contrast for evaluation of RWMA. Left ventricular diastolic parameters are consistent with Grade I diastolic dysfunction (impaired relaxation).  2. Right ventricular systolic function is normal. The right ventricular size is normal. There is normal pulmonary artery systolic pressure.  3. The mitral valve is normal in structure. Mild mitral valve regurgitation. No evidence of mitral stenosis.  4. The aortic valve is normal in structure. Aortic valve regurgitation is not visualized. No aortic stenosis is present.  5. The inferior vena cava is normal in size with greater than 50% respiratory variability, suggesting right atrial pressure of 3 mmHg.       Disposition Plan  :    Status is: Inpatient   DVT Prophylaxis  :    SCDs Start: 05/16/24 2126    Lab Results  Component Value Date   PLT 156 05/22/2024    Diet :  Diet Order             DIET SOFT Fluid consistency: Thin  Diet effective now           Diet - low sodium heart healthy                     Inpatient Medications  Scheduled Meds:  carvedilol  6.25 mg Oral BID WC   feeding supplement  237 mL Oral BID BM   ferrous sulfate   325 mg Oral Q breakfast   folic acid   1 mg Oral Daily   hydrALAZINE   50 mg Oral Q8H   pantoprazole  (PROTONIX ) IV  40 mg Intravenous Q12H   rosuvastatin  10 mg Oral Daily   Continuous Infusions:  lactated ringers  50 mL/hr at 05/21/24 1637   PRN Meds:.acetaminophen  **OR** acetaminophen , cholestyramine , cyclobenzaprine, diphenhydrAMINE , HYDROcodone -acetaminophen , hydrocortisone , loperamide , menthol , naLOXone (NARCAN)  injection, ondansetron  **OR** ondansetron  (ZOFRAN ) IV, phenol, traZODone   Antibiotics  :    Anti-infectives (From admission, onward)    None         Objective:   Vitals:   05/21/24 1622 05/21/24 2031 05/21/24 2355 05/22/24 0314  BP: (!) 154/96 132/84 (!) 147/98 111/63  Pulse: (!) 103 95 89 80  Resp: 18 18 18 18   Temp: 98.4 F (36.9 C) 98.7 F (37.1 C) 98.4 F (36.9 C) 98 F (36.7 C)  TempSrc: Oral Oral Oral Oral  SpO2:  99% 98% 99%  Weight:      Height:        Wt Readings from Last 3 Encounters:  05/16/24 73.3 kg  02/18/24 72.4 kg  01/04/24 73 kg     Intake/Output Summary (Last 24 hours) at 05/22/2024 0716 Last data filed at 05/21/2024 1700 Gross per 24 hour  Intake 747 ml  Output --  Net 747 ml     Physical Exam  Awake Alert, No new F.N deficits, Normal affect [place].AT,PERRAL Supple Neck, No JVD,   Symmetrical Chest wall movement, Good air movement bilaterally, CTAB RRR,No Gallops,Rubs or new Murmurs,  +ve B.Sounds, Abd Soft, No tenderness,   No Cyanosis, Clubbing or edema       Data Review:    Recent Labs  Lab 05/19/24 0511 05/19/24 1406 05/19/24 2031 05/20/24 0359 05/20/24 1342 05/21/24 0226 05/22/24 0557  WBC 6.6   < > 5.3 6.5 8.3 6.1 4.7  HGB 9.2*   < > 7.7* 7.6* 7.4* 5.7* 9.7*  HCT 27.7*   < > 23.7* 23.2* 23.4* 17.3* 29.0*  PLT 175   < > 123* 168 PLATELET CLUMPS NOTED ON SMEAR,  UNABLE TO ESTIMATE 168 156  MCV 93.9   < > 94.8 93.9 98.3 93.5 87.6  MCH 31.2   < > 30.8 30.8 31.1 30.8 29.3  MCHC 33.2   < > 32.5 32.8 31.6 32.9 33.4  RDW 18.9*   < > 18.5* 18.6* 18.6* 18.5* 20.8*  LYMPHSABS 2.1  --   --  1.5 2.3 1.4 1.1  MONOABS 0.5  --   --  0.4 0.5 0.5 0.4  EOSABS 0.1  --   --  0.0 0.1 0.1 0.1  BASOSABS 0.0  --   --  0.0 0.0 0.0 0.0   < > = values in this interval not displayed.    Recent Labs  Lab 05/16/24 2334 05/17/24 0446 05/17/24 0446 05/18/24 0442 05/18/24 0459 05/19/24 0511 05/20/24 0359 05/21/24 0226 05/22/24 0313  NA  --  131*   < > 133*  --  130* 129* 131* 131*  K  --  4.7   < > 4.5  --  4.5 4.9 3.7 3.6  CL  --  100   < > 101  --  98 98 101 102  CO2  --  19*   < > 22  --  24 20* 19* 18*  ANIONGAP  --  12   < > 10  --  8 11 11 11   GLUCOSE  --  132*   < > 77  --  105* 98 67* 65*  BUN  --  6   < > 6  --  13 18 12 6   CREATININE  --  0.59   < > 0.64  --  0.61 0.59 0.64 0.56  AST  --   --   --  109*  --  102* 72* 58* 68*  ALT  --   --   --  25  --  26 21 17 17   ALKPHOS  --   --   --  138*  --  146* 127* 105 112  BILITOT  --   --   --  1.4*  --  1.5* 1.3* 0.9 1.5*  ALBUMIN   --   --   --  2.7*  --  2.7* 2.8* 2.6* 2.7*  INR 1.0  --   --   --   --   --   --   --   --   HGBA1C  --   --   --  4.3*  --   --   --   --   --   MG  --  1.4*  --   --  1.7 1.6*  --   --   --   CALCIUM  --  8.3*   < >  8.2*  --  8.7* 8.4* 8.2* 8.2*   < > = values in this interval not displayed.      Recent Labs  Lab 05/16/24 2334 05/17/24 0446 05/17/24 0446 05/18/24 0442 05/18/24 0459 05/19/24 0511 05/20/24 0359 05/21/24 0226 05/22/24 0313  INR 1.0  --   --   --   --   --   --   --   --   HGBA1C  --   --   --  4.3*  --   --   --   --   --   MG  --  1.4*  --   --  1.7 1.6*  --   --   --   CALCIUM  --  8.3*   < > 8.2*  --  8.7* 8.4* 8.2* 8.2*   < > = values in this interval not displayed.     --------------------------------------------------------------------------------------------------------------- Lab Results  Component Value Date   CHOL 194 05/19/2024   HDL 85 05/19/2024   LDLCALC 94 05/19/2024   TRIG 77 05/19/2024   CHOLHDL 2.3 05/19/2024    Lab Results  Component Value Date   HGBA1C 4.3 (L) 05/18/2024   No results for input(s): TSH, T4TOTAL, FREET4, T3FREE, THYROIDAB in the last 72 hours. No results for input(s): VITAMINB12, FOLATE, FERRITIN, TIBC, IRON, RETICCTPCT in the last 72 hours.  ------------------------------------------------------------------------------------------------------------------ Cardiac Enzymes No results for input(s): CKMB, TROPONINI, MYOGLOBIN in the last 168 hours.  Invalid input(s): CK  Micro Results Recent Results (from the past 240 hours)  MRSA Next Gen by PCR, Nasal     Status: None   Collection Time: 05/17/24  3:04 AM   Specimen: Nasal Mucosa; Nasal Swab  Result Value Ref Range Status   MRSA by PCR Next Gen NOT DETECTED NOT DETECTED Final    Comment: (NOTE) The GeneXpert MRSA Assay (FDA approved for NASAL specimens only), is one component of a comprehensive MRSA colonization surveillance program. It is not intended to diagnose MRSA infection nor to guide or monitor treatment for MRSA infections. Test performance is not FDA approved in patients less than 102 years old. Performed at [facility], [address], [place], [place] [address]     Radiology Report  No results found.    Signature  -   Lavada Stank M.D on 05/22/2024 at 7:16 AM   -  To page go to [email or web address]

## 2024-05-23 ENCOUNTER — Other Ambulatory Visit (HOSPITAL_COMMUNITY): Payer: Self-pay

## 2024-05-23 LAB — BPAM RBC
Blood Product Expiration Date: 202511182359
Blood Product Expiration Date: 202511222359
Blood Product Expiration Date: 202511222359
ISSUE DATE / TIME: 202511050631
ISSUE DATE / TIME: 202511051209
Unit Type and Rh: 6200
Unit Type and Rh: 6200
Unit Type and Rh: 6200

## 2024-05-23 LAB — TYPE AND SCREEN
ABO/RH(D): A POS
ABO/RH(D): A POS
Antibody Screen: NEGATIVE
Antibody Screen: NEGATIVE
Unit division: 0
Unit division: 0
Unit division: 0

## 2024-05-23 LAB — CBC WITH DIFFERENTIAL/PLATELET
Abs Immature Granulocytes: 0.03 K/uL (ref 0.00–0.07)
Basophils Absolute: 0 K/uL (ref 0.0–0.1)
Basophils Relative: 0 %
Eosinophils Absolute: 0.1 K/uL (ref 0.0–0.5)
Eosinophils Relative: 1 %
HCT: 25.6 % — ABNORMAL LOW (ref 36.0–46.0)
Hemoglobin: 8.6 g/dL — ABNORMAL LOW (ref 12.0–15.0)
Immature Granulocytes: 1 %
Lymphocytes Relative: 28 %
Lymphs Abs: 1.4 K/uL (ref 0.7–4.0)
MCH: 29.2 pg (ref 26.0–34.0)
MCHC: 33.6 g/dL (ref 30.0–36.0)
MCV: 86.8 fL (ref 80.0–100.0)
Monocytes Absolute: 0.7 K/uL (ref 0.1–1.0)
Monocytes Relative: 13 %
Neutro Abs: 2.8 K/uL (ref 1.7–7.7)
Neutrophils Relative %: 57 %
Platelets: 163 K/uL (ref 150–400)
RBC: 2.95 MIL/uL — ABNORMAL LOW (ref 3.87–5.11)
RDW: 19.9 % — ABNORMAL HIGH (ref 11.5–15.5)
WBC: 4.9 K/uL (ref 4.0–10.5)
nRBC: 0 % (ref 0.0–0.2)

## 2024-05-23 LAB — CBC
HCT: 27.6 % — ABNORMAL LOW (ref 36.0–46.0)
HCT: 34.3 % — ABNORMAL LOW (ref 36.0–46.0)
Hemoglobin: 9 g/dL — ABNORMAL LOW (ref 12.0–15.0)
Hemoglobin: 11.1 g/dL — ABNORMAL LOW (ref 12.0–15.0)
MCH: 28.9 pg (ref 26.0–34.0)
MCH: 29.1 pg (ref 26.0–34.0)
MCHC: 32.6 g/dL (ref 30.0–36.0)
MCHC: 32.4 g/dL (ref 30.0–36.0)
MCV: 88.7 fL (ref 80.0–100.0)
MCV: 89.8 fL (ref 80.0–100.0)
Platelets: 171 K/uL (ref 150–400)
Platelets: 181 K/uL (ref 150–400)
RBC: 3.11 MIL/uL — ABNORMAL LOW (ref 3.87–5.11)
RBC: 3.82 MIL/uL — ABNORMAL LOW (ref 3.87–5.11)
RDW: 20.1 % — ABNORMAL HIGH (ref 11.5–15.5)
RDW: 20.2 % — ABNORMAL HIGH (ref 11.5–15.5)
WBC: 4.2 K/uL (ref 4.0–10.5)
WBC: 5.4 K/uL (ref 4.0–10.5)
nRBC: 0 % (ref 0.0–0.2)
nRBC: 0 % (ref 0.0–0.2)

## 2024-05-23 LAB — BASIC METABOLIC PANEL WITH GFR
Anion gap: 10 (ref 5–15)
BUN: <5 mg/dL — ABNORMAL LOW (ref 6–20)
CO2: 20 mmol/L — ABNORMAL LOW (ref 22–32)
Calcium: 8.2 mg/dL — ABNORMAL LOW (ref 8.9–10.3)
Chloride: 100 mmol/L (ref 98–111)
Creatinine, Ser: 0.47 mg/dL (ref 0.44–1.00)
GFR, Estimated: >60 mL/min (ref >60)
Glucose, Bld: 103 mg/dL — ABNORMAL HIGH (ref 70–99)
Potassium: 3.6 mmol/L (ref 3.5–5.1)
Sodium: 130 mmol/L — ABNORMAL LOW (ref 135–145)

## 2024-05-23 LAB — MAGNESIUM: Magnesium: 1.3 mg/dL — ABNORMAL LOW (ref 1.7–2.4)

## 2024-05-23 MED ORDER — MAGNESIUM SULFATE 4 GM/100ML IV SOLN
4.0000 g | Freq: Once | INTRAVENOUS | Status: AC
  Administered 2024-05-23: 4 g via INTRAVENOUS
  Filled 2024-05-23: qty 100

## 2024-05-23 NOTE — Progress Notes (Signed)
 Patient ID: Alyssa Carey, female   DOB: 07/22/1970, 53 y.o.   MRN: [id number]    Progress Note   Subjective   Day # 6 CC; melena  EGD/11/ 2/25-with distal esophageal stricture dilated to 13.5 mm balloon, noted multiple superficial gastric ulcers with no stigmata of active bleeding and more cratered appearing prepyloric ulcer without stigmata of bleeding Gastric biopsies with chronic gastritis and reactive changes, esophageal biopsy with mild chronic nonassisted specific esophagitis, negative for EOE  Hemoglobin 11/.5 /2025 -5.7> transfused up to 9.7 yesterday>8.6 this a.m.  Patient had another melenic stool this morning.  She says she had a melenic bowel movement yesterday morning as well.  She is not having any nausea or vomiting.  Says she has some abdominal growling type of discomfort prior to the episode this morning.   Objective   Vital signs in last 24 hours: Temp:  [98.5 F (36.9 C)-99.4 F (37.4 C)] 98.7 F (37.1 C) (11/06 2000) Pulse Rate:  [94-98] 94 (11/06 2000) Resp:  [18] 18 (11/06 2000) BP: (98-129)/(68-95) 106/72 (11/07 0600) SpO2:  [98 %] 98 % (11/06 2000) Last BM Date : 05/21/24 General:    Older African-American female in NAD Heart:  Regular rate and rhythm; no murmurs Lungs: Respirations even and unlabored, lungs CTA bilaterally Abdomen:  Soft, no focal tenderness, no guarding or rebound, normal bowel sounds. Extremities:  Without edema. Neurologic:  Alert and oriented,  grossly normal neurologically. Psych:  Cooperative. Normal mood and affect.  Intake/Output from previous day: No intake/output data recorded. Intake/Output this shift: No intake/output data recorded.  Lab Results: Recent Labs    05/22/24 1018 05/23/24 0337 05/23/24 0929  WBC 6.3 4.9 4.2  HGB 10.8* 8.6* 9.0*  HCT 32.4* 25.6* 27.6*  PLT 190 163 171   BMET Recent Labs    05/21/24 0226 05/22/24 0313 05/23/24 0337  NA 131* 131* 130*  K 3.7 3.6 3.6  CL 101 102 100  CO2 19*  18* 20*  GLUCOSE 67* 65* 103*  BUN 12 6 <5*  CREATININE 0.64 0.56 0.47  CALCIUM 8.2* 8.2* 8.2*   LFT Recent Labs    05/22/24 0313  PROT 5.1*  ALBUMIN  2.7*  AST 68*  ALT 17  ALKPHOS 112  BILITOT 1.5*   PT/INR No results for input(s): LABPROT, INR in the last 72 hours.  Studies/Results: No results found.     Assessment / Plan:    #47 53 year old female who was admitted with nausea vomiting and diarrhea, initially presented to [facility] and had elevated troponins and was therefore transferred here.  This is in the setting of previous diagnosis of EtOH related cirrhosis.  She was anemic and heme positive here. She underwent EGD on 05/18/2024 with no esophageal varices present, there was a benign distal esophageal stricture which was balloon dilated, and multiple superficial gastric ulcers with no stigmata of bleeding and then a more cratered appearing prepyloric ulcer also without any stigmata of bleeding.  Biopsies are benign.  Unfortunately she has continued to have melena over the past few days, required transfusion again yesterday x 2 and hemoglobin has been slowly drifting again. He is having about 1 melenic bowel movement per day. #2 history of hypertension #3 history of atrial flutter  Plan; for soft diet today, n.p.o. past midnight Continue twice daily PPI Continue to trend hemoglobin and transfuse as indicated Will be scheduled for EGD and enteroscopy with Dr. Albertus for tomorrow 05/24/2024.  Procedure was discussed in detail with the patient  including indications risk and benefits and she is agreeable to proceed.  If procedure is unrevealing as to source then we will proceed with capsule endoscopy.  Plans were discussed with the patient and she is agreeable to proceed and appreciative of follow-up     Principal Problem:   GI bleed Active Problems:   Esophageal stricture   Chronic gastric ulcer with hemorrhage     LOS: 7 days   Marieann Zipp PA-C  05/23/2024, 10:39 AM

## 2024-05-23 NOTE — Progress Notes (Signed)
 PROGRESS NOTE                                                                                                                                                                                                             Patient Demographics:    Alyssa Carey, is a 53 y.o. female, DOB - January 22, 1971, FMW:[id number]  Outpatient Primary MD for the patient is [name], MD    LOS - 7  Admit date - 05/16/2024    Chief Complaint  Patient presents with   GI Problem   Code STEMI       Brief Narrative (HPI from H&P)    53 y.o. female with medical history significant of alcoholic hepatitis, hyperlipidemia, hypertension, a flutter who presented with 6 days of nausea vomiting diarrhea.  Patient was at [facility] and numerous reported sick contacts.  She thought she had a gastrointestinal illness.  She presented to the ER where she was found to be afebrile and hemodynamically stable.  She was found to have hypokalemia, hypomagnesemia and elevated LFTs so she was admitted for further workup.  Her electrolytes were repleted.  At the outside hospital she was found to have elevated troponins with ST changes on EKG.  Cardiology was consulted and patient was recommended to start dual antiplatelet.  Patient was referred for outpatient follow-up however bloody stools and acute blood loss anemia was transferred for further management.    Subjective:   Patient in bed, appears comfortable, denies any headache, no fever, no chest pain or pressure, no shortness of breath , no abdominal pain. No new focal weakness.  Had another episode of melanotic stool morning of 05/23/2024.   Assessment  & Plan :   Nausea vomiting and diarrhea, likely gastroenteritis.  History of chronic anemia on iron and folic acid  supplements, history of chronic loose stools takes Imodium  and cholestyramine  as needed.  Now upper GI bleed, EGD showing gastric ulcers and  esophageal stricture requiring dilation.   She has been treated with supportive care, on IV PPI, requested to stop using NSAIDs, seen by GI underwent EGD showing esophageal stricture and nonbleeding gastric ulcers, she was actually discharged on 05/19/2024 after which she had another episode of hematemesis, unfortunately she is still having melanotic stools on 05/21/2024, received 2 units of packed RBC on 05/21/2024, he was doing well unfortunately  another melanotic stool morning of 05/23/2024 with mild drop in H&H, dropped her diet down to clears, GI informed continue to monitor CBC.   Random mildly elevated troponin at outlying ER.  Unclear reason why it was checked, he had no chest pain, no chest pain here, EKG shows changes suggestive of LVH, history of smoking, echo shows a preserved EF but does have wall motion abnormality.  Troponin was negative, case discussed with cardiologist Dr. Walton who has cleared her for EGD along with outpatient cardiology follow-up.  No inpatient testing.  If cleared by GI will place her on a beta-blocker, statin and baby aspirin  with outpatient cardiology follow-up.   Mild sore throat after EGD.  Resolved   Essential hypertension.  Placed on Coreg PCP to monitor   Hypokalemia and hypomagnesemia.  Replaced.   Hyponatremia.  Continue hydration.  Dyslipidemia.  Placed on Crestor for LDL to be at goal PCP to monitor.      Condition - Fair  Family Communication  :  None  Code Status :  Full  Consults  :  GI  PUD Prophylaxis : PPI   Procedures  :     EGD   Impression:               - Benign-appearing esophageal stenosis. Dilated. No                            evidence of esophageal varices.                           - Non-bleeding gastric ulcers with no stigmata of                            bleeding. Biopsied.                           - 2 cm hiatal hernia.                           - Normal examined duodenum.                           - Biopsies were  taken with a cold forceps for                            evaluation of eosinophilic esophagitis. Moderate Sedation:      N/A Recommendation:           - Return patient to hospital ward for ongoing care.                           - Resume regular diet.                           - Continue Protonix  (pantoprazole ) 40 mg IV twice                            daily. She will need twice daily PO Protonix  for 8  weeks, followed once daily PPI indefinitely                           - Repeat upper endoscopy in 8 weeks to check                            healing.                           - Await pathology results.   Echo -  1. Left ventricular ejection fraction, by estimation, is 60 to 65%. The left ventricle has normal function. The left ventricle demonstrates regional wall motion abnormalities, lateral wall appears to be hypokinetic. Consider Limited Echo with contrast for evaluation of RWMA. Left ventricular diastolic parameters are consistent with Grade I diastolic dysfunction (impaired relaxation).  2. Right ventricular systolic function is normal. The right ventricular size is normal. There is normal pulmonary artery systolic pressure.  3. The mitral valve is normal in structure. Mild mitral valve regurgitation. No evidence of mitral stenosis.  4. The aortic valve is normal in structure. Aortic valve regurgitation is not visualized. No aortic stenosis is present.  5. The inferior vena cava is normal in size with greater than 50% respiratory variability, suggesting right atrial pressure of 3 mmHg.       Disposition Plan  :    Status is: Inpatient   DVT Prophylaxis  :    SCDs Start: 05/16/24 2126    Lab Results  Component Value Date   PLT 163 05/23/2024    Diet :  Diet Order             Diet clear liquid Room service appropriate? Yes; Fluid consistency: Thin  Diet effective now           Diet - low sodium heart healthy                    Inpatient  Medications  Scheduled Meds:  carvedilol  6.25 mg Oral BID WC   feeding supplement  237 mL Oral BID BM   ferrous sulfate   325 mg Oral Q breakfast   folic acid   1 mg Oral Daily   hydrALAZINE   50 mg Oral Q8H   pantoprazole   40 mg Oral BID   rosuvastatin  10 mg Oral Daily   Continuous Infusions:   PRN Meds:.acetaminophen  **OR** acetaminophen , cholestyramine , cyclobenzaprine, diphenhydrAMINE , HYDROcodone -acetaminophen , hydrocortisone , loperamide , menthol , naLOXone (NARCAN)  injection, ondansetron  **OR** ondansetron  (ZOFRAN ) IV, phenol, traZODone   Antibiotics  :    Anti-infectives (From admission, onward)    None         Objective:   Vitals:   05/22/24 1550 05/22/24 2000 05/22/24 2104 05/23/24 0600  BP: 98/82  127/89 106/72  Pulse: 98 94    Resp: 18 18    Temp: 99.4 F (37.4 C) 98.7 F (37.1 C)    TempSrc: Oral Oral    SpO2:  98%    Weight:      Height:        Wt Readings from Last 3 Encounters:  05/16/24 73.3 kg  02/18/24 72.4 kg  01/04/24 73 kg    No intake or output data in the 24 hours ending 05/23/24 0847    Physical Exam  Awake Alert, No new F.N deficits, Normal affect [place].AT,PERRAL Supple Neck, No JVD,   Symmetrical Chest wall movement, Good air  movement bilaterally, CTAB RRR,No Gallops,Rubs or new Murmurs,  +ve B.Sounds, Abd Soft, No tenderness,   No Cyanosis, Clubbing or edema       Data Review:    Recent Labs  Lab 05/20/24 0359 05/20/24 1342 05/21/24 0226 05/22/24 0557 05/22/24 1018 05/23/24 0337  WBC 6.5 8.3 6.1 4.7 6.3 4.9  HGB 7.6* 7.4* 5.7* 9.7* 10.8* 8.6*  HCT 23.2* 23.4* 17.3* 29.0* 32.4* 25.6*  PLT 168 PLATELET CLUMPS NOTED ON SMEAR, UNABLE TO ESTIMATE 168 156 190 163  MCV 93.9 98.3 93.5 87.6 88.3 86.8  MCH 30.8 31.1 30.8 29.3 29.4 29.2  MCHC 32.8 31.6 32.9 33.4 33.3 33.6  RDW 18.6* 18.6* 18.5* 20.8* 20.9* 19.9*  LYMPHSABS 1.5 2.3 1.4 1.1  --  1.4  MONOABS 0.4 0.5 0.5 0.4  --  0.7  EOSABS 0.0 0.1 0.1 0.1  --  0.1   BASOSABS 0.0 0.0 0.0 0.0  --  0.0    Recent Labs  Lab 05/16/24 2334 05/17/24 0446 05/17/24 0446 05/18/24 0442 05/18/24 0459 05/19/24 0511 05/20/24 0359 05/21/24 0226 05/22/24 0313 05/23/24 0337  NA  --  131*   < > 133*  --  130* 129* 131* 131* 130*  K  --  4.7   < > 4.5  --  4.5 4.9 3.7 3.6 3.6  CL  --  100   < > 101  --  98 98 101 102 100  CO2  --  19*   < > 22  --  24 20* 19* 18* 20*  ANIONGAP  --  12   < > 10  --  8 11 11 11 10   GLUCOSE  --  132*   < > 77  --  105* 98 67* 65* 103*  BUN  --  6   < > 6  --  13 18 12 6  <5*  CREATININE  --  0.59   < > 0.64  --  0.61 0.59 0.64 0.56 0.47  AST  --   --   --  109*  --  102* 72* 58* 68*  --   ALT  --   --   --  25  --  26 21 17 17   --   ALKPHOS  --   --   --  138*  --  146* 127* 105 112  --   BILITOT  --   --   --  1.4*  --  1.5* 1.3* 0.9 1.5*  --   ALBUMIN   --   --   --  2.7*  --  2.7* 2.8* 2.6* 2.7*  --   INR 1.0  --   --   --   --   --   --   --   --   --   HGBA1C  --   --   --  4.3*  --   --   --   --   --   --   MG  --  1.4*  --   --  1.7 1.6*  --   --   --  1.3*  CALCIUM  --  8.3*   < > 8.2*  --  8.7* 8.4* 8.2* 8.2* 8.2*   < > = values in this interval not displayed.      Recent Labs  Lab 05/16/24 2334 05/17/24 0446 05/17/24 0446 05/18/24 0442 05/18/24 0459 05/19/24 0511 05/20/24 0359 05/21/24 0226 05/22/24 0313 05/23/24 0337  INR 1.0  --   --   --   --   --   --   --   --   --  HGBA1C  --   --   --  4.3*  --   --   --   --   --   --   MG  --  1.4*  --   --  1.7 1.6*  --   --   --  1.3*  CALCIUM  --  8.3*   < > 8.2*  --  8.7* 8.4* 8.2* 8.2* 8.2*   < > = values in this interval not displayed.    --------------------------------------------------------------------------------------------------------------- Lab Results  Component Value Date   CHOL 194 05/19/2024   HDL 85 05/19/2024   LDLCALC 94 05/19/2024   TRIG 77 05/19/2024   CHOLHDL 2.3 05/19/2024    Lab Results  Component Value Date   HGBA1C 4.3  (L) 05/18/2024   No results for input(s): TSH, T4TOTAL, FREET4, T3FREE, THYROIDAB in the last 72 hours. No results for input(s): VITAMINB12, FOLATE, FERRITIN, TIBC, IRON, RETICCTPCT in the last 72 hours.  ------------------------------------------------------------------------------------------------------------------ Cardiac Enzymes No results for input(s): CKMB, TROPONINI, MYOGLOBIN in the last 168 hours.  Invalid input(s): CK  Micro Results Recent Results (from the past 240 hours)  MRSA Next Gen by PCR, Nasal     Status: None   Collection Time: 05/17/24  3:04 AM   Specimen: Nasal Mucosa; Nasal Swab  Result Value Ref Range Status   MRSA by PCR Next Gen NOT DETECTED NOT DETECTED Final    Comment: (NOTE) The GeneXpert MRSA Assay (FDA approved for NASAL specimens only), is one component of a comprehensive MRSA colonization surveillance program. It is not intended to diagnose MRSA infection nor to guide or monitor treatment for MRSA infections. Test performance is not FDA approved in patients less than 59 years old. Performed at [facility], [address] [place], [place] [address]     Radiology Report  No results found.    Signature  -   Lavada Stank M.D on 05/23/2024 at 8:47 AM   -  To page go to [email or web address]

## 2024-05-23 NOTE — Plan of Care (Signed)

## 2024-05-23 NOTE — H&P (View-Only) (Signed)
 Patient ID: Alyssa Carey, female   DOB: 07/22/1970, 53 y.o.   MRN: [id number]    Progress Note   Subjective   Day # 6 CC; melena  EGD/11/ 2/25-with distal esophageal stricture dilated to 13.5 mm balloon, noted multiple superficial gastric ulcers with no stigmata of active bleeding and more cratered appearing prepyloric ulcer without stigmata of bleeding Gastric biopsies with chronic gastritis and reactive changes, esophageal biopsy with mild chronic nonassisted specific esophagitis, negative for EOE  Hemoglobin 11/.5 /2025 -5.7> transfused up to 9.7 yesterday>8.6 this a.m.  Patient had another melenic stool this morning.  She says she had a melenic bowel movement yesterday morning as well.  She is not having any nausea or vomiting.  Says she has some abdominal growling type of discomfort prior to the episode this morning.   Objective   Vital signs in last 24 hours: Temp:  [98.5 F (36.9 C)-99.4 F (37.4 C)] 98.7 F (37.1 C) (11/06 2000) Pulse Rate:  [94-98] 94 (11/06 2000) Resp:  [18] 18 (11/06 2000) BP: (98-129)/(68-95) 106/72 (11/07 0600) SpO2:  [98 %] 98 % (11/06 2000) Last BM Date : 05/21/24 General:    Older African-American female in NAD Heart:  Regular rate and rhythm; no murmurs Lungs: Respirations even and unlabored, lungs CTA bilaterally Abdomen:  Soft, no focal tenderness, no guarding or rebound, normal bowel sounds. Extremities:  Without edema. Neurologic:  Alert and oriented,  grossly normal neurologically. Psych:  Cooperative. Normal mood and affect.  Intake/Output from previous day: No intake/output data recorded. Intake/Output this shift: No intake/output data recorded.  Lab Results: Recent Labs    05/22/24 1018 05/23/24 0337 05/23/24 0929  WBC 6.3 4.9 4.2  HGB 10.8* 8.6* 9.0*  HCT 32.4* 25.6* 27.6*  PLT 190 163 171   BMET Recent Labs    05/21/24 0226 05/22/24 0313 05/23/24 0337  NA 131* 131* 130*  K 3.7 3.6 3.6  CL 101 102 100  CO2 19*  18* 20*  GLUCOSE 67* 65* 103*  BUN 12 6 <5*  CREATININE 0.64 0.56 0.47  CALCIUM 8.2* 8.2* 8.2*   LFT Recent Labs    05/22/24 0313  PROT 5.1*  ALBUMIN  2.7*  AST 68*  ALT 17  ALKPHOS 112  BILITOT 1.5*   PT/INR No results for input(s): LABPROT, INR in the last 72 hours.  Studies/Results: No results found.     Assessment / Plan:    #47 53 year old female who was admitted with nausea vomiting and diarrhea, initially presented to [facility] and had elevated troponins and was therefore transferred here.  This is in the setting of previous diagnosis of EtOH related cirrhosis.  She was anemic and heme positive here. She underwent EGD on 05/18/2024 with no esophageal varices present, there was a benign distal esophageal stricture which was balloon dilated, and multiple superficial gastric ulcers with no stigmata of bleeding and then a more cratered appearing prepyloric ulcer also without any stigmata of bleeding.  Biopsies are benign.  Unfortunately she has continued to have melena over the past few days, required transfusion again yesterday x 2 and hemoglobin has been slowly drifting again. He is having about 1 melenic bowel movement per day. #2 history of hypertension #3 history of atrial flutter  Plan; for soft diet today, n.p.o. past midnight Continue twice daily PPI Continue to trend hemoglobin and transfuse as indicated Will be scheduled for EGD and enteroscopy with Dr. Albertus for tomorrow 05/24/2024.  Procedure was discussed in detail with the patient  including indications risk and benefits and she is agreeable to proceed.  If procedure is unrevealing as to source then we will proceed with capsule endoscopy.  Plans were discussed with the patient and she is agreeable to proceed and appreciative of follow-up     Principal Problem:   GI bleed Active Problems:   Esophageal stricture   Chronic gastric ulcer with hemorrhage     LOS: 7 days   Marieann Zipp PA-C  05/23/2024, 10:39 AM

## 2024-05-24 ENCOUNTER — Inpatient Hospital Stay (HOSPITAL_COMMUNITY): Admitting: Anesthesiology

## 2024-05-24 ENCOUNTER — Encounter (HOSPITAL_COMMUNITY)
Admission: RE | Disposition: A | Discharge status: Home / Self Care | Payer: Self-pay | Source: Home / Self Care | Attending: Internal Medicine

## 2024-05-24 ENCOUNTER — Encounter (HOSPITAL_COMMUNITY): Payer: Self-pay | Admitting: Internal Medicine

## 2024-05-24 DIAGNOSIS — K922 Gastrointestinal hemorrhage, unspecified: Secondary | ICD-10-CM | POA: Diagnosis not present

## 2024-05-24 DIAGNOSIS — I509 Heart failure, unspecified: Secondary | ICD-10-CM | POA: Diagnosis not present

## 2024-05-24 DIAGNOSIS — R195 Other fecal abnormalities: Secondary | ICD-10-CM

## 2024-05-24 DIAGNOSIS — I110 Hypertensive heart disease with heart failure: Secondary | ICD-10-CM

## 2024-05-24 DIAGNOSIS — K250 Acute gastric ulcer with hemorrhage: Secondary | ICD-10-CM

## 2024-05-24 HISTORY — PX: ENTEROSCOPY: SHX5533

## 2024-05-24 HISTORY — PX: HEMOSTASIS CLIP PLACEMENT: SHX6857

## 2024-05-24 LAB — CBC WITH DIFFERENTIAL/PLATELET
Abs Immature Granulocytes: 0.02 K/uL (ref 0.00–0.07)
Basophils Absolute: 0 K/uL (ref 0.0–0.1)
Basophils Relative: 0 %
Eosinophils Absolute: 0.1 K/uL (ref 0.0–0.5)
Eosinophils Relative: 1 %
HCT: 28.1 % — ABNORMAL LOW (ref 36.0–46.0)
Hemoglobin: 9.1 g/dL — ABNORMAL LOW (ref 12.0–15.0)
Immature Granulocytes: 1 %
Lymphocytes Relative: 36 %
Lymphs Abs: 1.6 K/uL (ref 0.7–4.0)
MCH: 28.8 pg (ref 26.0–34.0)
MCHC: 32.4 g/dL (ref 30.0–36.0)
MCV: 88.9 fL (ref 80.0–100.0)
Monocytes Absolute: 0.5 K/uL (ref 0.1–1.0)
Monocytes Relative: 12 %
Neutro Abs: 2.1 K/uL (ref 1.7–7.7)
Neutrophils Relative %: 50 %
Platelets: 196 K/uL (ref 150–400)
RBC: 3.16 MIL/uL — ABNORMAL LOW (ref 3.87–5.11)
RDW: 19.7 % — ABNORMAL HIGH (ref 11.5–15.5)
WBC: 4.3 K/uL (ref 4.0–10.5)
nRBC: 0 % (ref 0.0–0.2)

## 2024-05-24 LAB — BASIC METABOLIC PANEL WITH GFR
Anion gap: 10 (ref 5–15)
BUN: 7 mg/dL (ref 6–20)
CO2: 21 mmol/L — ABNORMAL LOW (ref 22–32)
Calcium: 8.5 mg/dL — ABNORMAL LOW (ref 8.9–10.3)
Chloride: 104 mmol/L (ref 98–111)
Creatinine, Ser: 0.64 mg/dL (ref 0.44–1.00)
GFR, Estimated: >60 mL/min (ref >60)
Glucose, Bld: 100 mg/dL — ABNORMAL HIGH (ref 70–99)
Potassium: 3.9 mmol/L (ref 3.5–5.1)
Sodium: 135 mmol/L (ref 135–145)

## 2024-05-24 LAB — MAGNESIUM: Magnesium: 1.7 mg/dL (ref 1.7–2.4)

## 2024-05-24 SURGERY — ENTEROSCOPY
Anesthesia: Monitor Anesthesia Care

## 2024-05-24 MED ORDER — MAGNESIUM SULFATE 2 GM/50ML IV SOLN
2.0000 g | Freq: Once | INTRAVENOUS | Status: AC
  Administered 2024-05-24: 2 g via INTRAVENOUS
  Filled 2024-05-24: qty 50

## 2024-05-24 MED ORDER — SODIUM CHLORIDE 0.9 % IV SOLN: INTRAVENOUS | Status: DC | PRN

## 2024-05-24 MED ORDER — PROPOFOL 500 MG/50ML IV EMUL
INTRAVENOUS | Status: DC | PRN
  Administered 2024-05-24: 150 ug/kg/min via INTRAVENOUS

## 2024-05-24 MED ORDER — MIDAZOLAM HCL 2 MG/2ML IJ SOLN
INTRAMUSCULAR | Status: AC
  Filled 2024-05-24: qty 2

## 2024-05-24 MED ORDER — LIDOCAINE 2% (20 MG/ML) 5 ML SYRINGE
INTRAMUSCULAR | Status: DC | PRN
Start: 2024-05-24 — End: 2024-05-24
  Administered 2024-05-24: 80 mg via INTRAVENOUS

## 2024-05-24 MED ORDER — PHENYLEPHRINE 80 MCG/ML (10ML) SYRINGE FOR IV PUSH (FOR BLOOD PRESSURE SUPPORT)
PREFILLED_SYRINGE | INTRAVENOUS | Status: DC | PRN
  Administered 2024-05-24: 160 ug via INTRAVENOUS

## 2024-05-24 MED ORDER — PROPOFOL 10 MG/ML IV BOLUS
INTRAVENOUS | Status: DC | PRN
  Administered 2024-05-24: 30 mg via INTRAVENOUS

## 2024-05-24 MED ORDER — ONDANSETRON HCL 4 MG/2ML IJ SOLN
INTRAMUSCULAR | Status: DC | PRN
  Administered 2024-05-24: 4 mg via INTRAVENOUS

## 2024-05-24 NOTE — Transfer of Care (Signed)
 Immediate Anesthesia Transfer of Care Note  Patient: Alyssa Carey  Procedure(s) Performed: ENTEROSCOPY CONTROL OF HEMORRHAGE, GI TRACT, ENDOSCOPIC, BY CLIPPING OR OVERSEWING  Patient Location: PACU  Anesthesia Type:MAC  Level of Consciousness: awake, alert , and oriented  Airway & Oxygen Therapy: Patient Spontanous Breathing  Post-op Assessment: Report given to RN, Post -op Vital signs reviewed and stable, and Patient moving all extremities X 4  Post vital signs: Reviewed and stable  Last Vitals:  Vitals Value Taken Time  BP 109/76 05/24/24 11:47  Temp    Pulse 97 05/24/24 11:51  Resp 37 05/24/24 11:51  SpO2 100 % 05/24/24 11:51  Vitals shown include unfiled device data.  Last Pain:  Vitals:   05/24/24 1011  TempSrc: Temporal  PainSc: 0-No pain      Patients Stated Pain Goal: 0 (05/23/24 2014)  Complications: No notable events documented.

## 2024-05-24 NOTE — Progress Notes (Signed)
 PROGRESS NOTE                                                                                                                                                                                                             Patient Demographics:    Alyssa Carey, is a 53 y.o. female, DOB - September 06, 1970, FMW:[id number]  Outpatient Primary MD for the patient is [name], MD    LOS - 8  Admit date - 05/16/2024    Chief Complaint  Patient presents with   GI Problem   Code STEMI       Brief Narrative (HPI from H&P)    53 y.o. female with medical history significant of alcoholic hepatitis, hyperlipidemia, hypertension, a flutter who presented with 6 days of nausea vomiting diarrhea.  Patient was at [facility] and numerous reported sick contacts.  She thought she had a gastrointestinal illness.  She presented to the ER where she was found to be afebrile and hemodynamically stable.  She was found to have hypokalemia, hypomagnesemia and elevated LFTs so she was admitted for further workup.  Her electrolytes were repleted.  At the outside hospital she was found to have elevated troponins with ST changes on EKG.  Cardiology was consulted and patient was recommended to start dual antiplatelet.  Patient was referred for outpatient follow-up however bloody stools and acute blood loss anemia was transferred for further management.    Subjective:   Patient in bed, appears comfortable, denies any headache, no fever, no chest pain or pressure, no shortness of breath , no abdominal pain. No new focal weakness.    Assessment  & Plan :   Nausea vomiting and diarrhea, likely gastroenteritis.  History of chronic anemia on iron and folic acid  supplements, history of chronic loose stools takes Imodium  and cholestyramine  as needed.  Now upper GI bleed, EGD showing gastric ulcers and esophageal stricture requiring dilation.   She has been treated  with supportive care, on IV PPI, requested to stop using NSAIDs, seen by GI underwent EGD showing esophageal stricture and nonbleeding gastric ulcers, she was actually discharged on 05/19/2024 after which she had another episode of hematemesis, unfortunately she is still having melanotic stools on 05/21/2024, received 2 units of packed RBC on 05/21/2024, he was doing well unfortunately another melanotic stool morning of 05/23/2024 with mild drop  in H&H, GI seeing her, repeat EGD scheduled for 05/24/2024, CBC seems to have stabilized continue to monitor if stable likely discharge home on 05/25/2024.   Random mildly elevated troponin at outlying ER.  Unclear reason why it was checked, he had no chest pain, no chest pain here, EKG shows changes suggestive of LVH, history of smoking, echo shows a preserved EF but does have wall motion abnormality.  Troponin was negative, case discussed with cardiologist Dr. Walton who has cleared her for EGD along with outpatient cardiology follow-up.  No inpatient testing.  If cleared by GI will place her on a beta-blocker, statin and baby aspirin  with outpatient cardiology follow-up.   Mild sore throat after EGD.  Resolved   Essential hypertension.  Placed on Coreg PCP to monitor   Hypokalemia and hypomagnesemia.  Replaced.   Hyponatremia.  Continue hydration.  Dyslipidemia.  Placed on Crestor for LDL to be at goal PCP to monitor.      Condition - Fair  Family Communication  :  None  Code Status :  Full  Consults  :  GI  PUD Prophylaxis : PPI   Procedures  :     EGD   Impression:               - Benign-appearing esophageal stenosis. Dilated. No                            evidence of esophageal varices.                           - Non-bleeding gastric ulcers with no stigmata of                            bleeding. Biopsied.                           - 2 cm hiatal hernia.                           - Normal examined duodenum.                           -  Biopsies were taken with a cold forceps for                            evaluation of eosinophilic esophagitis. Moderate Sedation:      N/A Recommendation:           - Return patient to hospital ward for ongoing care.                           - Resume regular diet.                           - Continue Protonix  (pantoprazole ) 40 mg IV twice                            daily. She will need twice daily PO Protonix  for 8  weeks, followed once daily PPI indefinitely                           - Repeat upper endoscopy in 8 weeks to check                            healing.                           - Await pathology results.   Echo -  1. Left ventricular ejection fraction, by estimation, is 60 to 65%. The left ventricle has normal function. The left ventricle demonstrates regional wall motion abnormalities, lateral wall appears to be hypokinetic. Consider Limited Echo with contrast for evaluation of RWMA. Left ventricular diastolic parameters are consistent with Grade I diastolic dysfunction (impaired relaxation).  2. Right ventricular systolic function is normal. The right ventricular size is normal. There is normal pulmonary artery systolic pressure.  3. The mitral valve is normal in structure. Mild mitral valve regurgitation. No evidence of mitral stenosis.  4. The aortic valve is normal in structure. Aortic valve regurgitation is not visualized. No aortic stenosis is present.  5. The inferior vena cava is normal in size with greater than 50% respiratory variability, suggesting right atrial pressure of 3 mmHg.       Disposition Plan  :    Status is: Inpatient   DVT Prophylaxis  :    SCDs Start: 05/16/24 2126    Lab Results  Component Value Date   PLT 196 05/24/2024    Diet :  Diet Order             Diet NPO time specified Except for: Sips with Meds  Diet effective midnight           Diet - low sodium heart healthy                    Inpatient  Medications  Scheduled Meds:  carvedilol  6.25 mg Oral BID WC   feeding supplement  237 mL Oral BID BM   ferrous sulfate   325 mg Oral Q breakfast   folic acid   1 mg Oral Daily   hydrALAZINE   50 mg Oral Q8H   pantoprazole   40 mg Oral BID   rosuvastatin  10 mg Oral Daily   Continuous Infusions:  magnesium  sulfate bolus IVPB      PRN Meds:.acetaminophen  **OR** acetaminophen , cholestyramine , cyclobenzaprine, diphenhydrAMINE , HYDROcodone -acetaminophen , hydrocortisone , loperamide , menthol , naLOXone (NARCAN)  injection, ondansetron  **OR** ondansetron  (ZOFRAN ) IV, phenol, traZODone   Antibiotics  :    Anti-infectives (From admission, onward)    None         Objective:   Vitals:   05/24/24 0448 05/24/24 0450 05/24/24 0451 05/24/24 0452  BP: 92/64 98/73    Pulse:  98 99   Resp:  18    Temp:  98.7 F (37.1 C)  98.7 F (37.1 C)  TempSrc:  Oral    SpO2:  98% 95%   Weight:      Height:        Wt Readings from Last 3 Encounters:  05/16/24 73.3 kg  02/18/24 72.4 kg  01/04/24 73 kg    No intake or output data in the 24 hours ending 05/24/24 0754    Physical Exam  Awake Alert, No new F.N deficits, Normal affect [place].AT,PERRAL Supple Neck, No JVD,  Symmetrical Chest wall movement, Good air movement bilaterally, CTAB RRR,No Gallops,Rubs or new Murmurs,  +ve B.Sounds, Abd Soft, No tenderness,   No Cyanosis, Clubbing or edema       Data Review:    Recent Labs  Lab 05/20/24 1342 05/21/24 0226 05/22/24 0557 05/22/24 1018 05/23/24 0337 05/23/24 0929 05/23/24 1810 05/24/24 0423  WBC 8.3 6.1 4.7 6.3 4.9 4.2 5.4 4.3  HGB 7.4* 5.7* 9.7* 10.8* 8.6* 9.0* 11.1* 9.1*  HCT 23.4* 17.3* 29.0* 32.4* 25.6* 27.6* 34.3* 28.1*  PLT PLATELET CLUMPS NOTED ON SMEAR, UNABLE TO ESTIMATE 168 156 190 163 171 181 196  MCV 98.3 93.5 87.6 88.3 86.8 88.7 89.8 88.9  MCH 31.1 30.8 29.3 29.4 29.2 28.9 29.1 28.8  MCHC 31.6 32.9 33.4 33.3 33.6 32.6 32.4 32.4  RDW 18.6* 18.5* 20.8* 20.9*  19.9* 20.1* 20.2* 19.7*  LYMPHSABS 2.3 1.4 1.1  --  1.4  --   --  1.6  MONOABS 0.5 0.5 0.4  --  0.7  --   --  0.5  EOSABS 0.1 0.1 0.1  --  0.1  --   --  0.1  BASOSABS 0.0 0.0 0.0  --  0.0  --   --  0.0    Recent Labs  Lab 05/18/24 0442 05/18/24 0459 05/19/24 0511 05/20/24 0359 05/21/24 0226 05/22/24 0313 05/23/24 0337 05/24/24 0423  NA 133*  --  130* 129* 131* 131* 130* 135  K 4.5  --  4.5 4.9 3.7 3.6 3.6 3.9  CL 101  --  98 98 101 102 100 104  CO2 22  --  24 20* 19* 18* 20* 21*  ANIONGAP 10  --  8 11 11 11 10 10   GLUCOSE 77  --  105* 98 67* 65* 103* 100*  BUN 6  --  13 18 12 6  <5* 7  CREATININE 0.64  --  0.61 0.59 0.64 0.56 0.47 0.64  AST 109*  --  102* 72* 58* 68*  --   --   ALT 25  --  26 21 17 17   --   --   ALKPHOS 138*  --  146* 127* 105 112  --   --   BILITOT 1.4*  --  1.5* 1.3* 0.9 1.5*  --   --   ALBUMIN  2.7*  --  2.7* 2.8* 2.6* 2.7*  --   --   HGBA1C 4.3*  --   --   --   --   --   --   --   MG  --  1.7 1.6*  --   --   --  1.3* 1.7  CALCIUM 8.2*  --  8.7* 8.4* 8.2* 8.2* 8.2* 8.5*      Recent Labs  Lab 05/18/24 0442 05/18/24 0459 05/19/24 0511 05/20/24 0359 05/21/24 0226 05/22/24 0313 05/23/24 0337 05/24/24 0423  HGBA1C 4.3*  --   --   --   --   --   --   --   MG  --  1.7 1.6*  --   --   --  1.3* 1.7  CALCIUM 8.2*  --  8.7* 8.4* 8.2* 8.2* 8.2* 8.5*    --------------------------------------------------------------------------------------------------------------- Lab Results  Component Value Date   CHOL 194 05/19/2024   HDL 85 05/19/2024   LDLCALC 94 05/19/2024   TRIG 77 05/19/2024   CHOLHDL 2.3 05/19/2024    Lab Results  Component Value Date   HGBA1C 4.3 (L) 05/18/2024   No results for input(s): TSH,  T4TOTAL, FREET4, T3FREE, THYROIDAB in the last 72 hours. No results for input(s): VITAMINB12, FOLATE, FERRITIN, TIBC, IRON, RETICCTPCT in the last 72  hours.  ------------------------------------------------------------------------------------------------------------------ Cardiac Enzymes No results for input(s): CKMB, TROPONINI, MYOGLOBIN in the last 168 hours.  Invalid input(s): CK  Micro Results Recent Results (from the past 240 hours)  MRSA Next Gen by PCR, Nasal     Status: None   Collection Time: 05/17/24  3:04 AM   Specimen: Nasal Mucosa; Nasal Swab  Result Value Ref Range Status   MRSA by PCR Next Gen NOT DETECTED NOT DETECTED Final    Comment: (NOTE) The GeneXpert MRSA Assay (FDA approved for NASAL specimens only), is one component of a comprehensive MRSA colonization surveillance program. It is not intended to diagnose MRSA infection nor to guide or monitor treatment for MRSA infections. Test performance is not FDA approved in patients less than 75 years old. Performed at [facility], [address] [place], [place] [address]     Radiology Report  No results found.    Signature  -   Lavada Stank M.D on 05/24/2024 at 7:54 AM   -  To page go to [email or web address]

## 2024-05-24 NOTE — Op Note (Signed)
 [facility] Patient Name: Alyssa Carey Procedure Date : 05/24/2024 MRN: [id number] Attending MD: Gordy CHRISTELLA Starch , MD, [id number] Date of Birth: 1970-07-31 CSN: [id number] Age: 53 Admit Type: Inpatient Procedure:                Small bowel enteroscopy Indications:              Melena (recurrent) with need for pRBC transfusions,                            previous EGD with clean-based ulcers on 05/18/24 (bx                            neg for dysplasia, IHC for H. Pylori addendum is                            still pending today) Providers:                Gordy CHRISTELLA. Starch, MD, Collene Edu, RN, Joya Bunnell,                            Technician Referring MD:             Prentice CROME. Quinn Jr. Medicines:                Monitored Anesthesia Care Complications:            No immediate complications. Estimated Blood Loss:     Estimated blood loss: none. Procedure:                Pre-Anesthesia Assessment:                           - Prior to the procedure, a History and Physical                            was performed, and patient medications and                            allergies were reviewed. The patient's tolerance of                            previous anesthesia was also reviewed. The risks                            and benefits of the procedure and the sedation                            options and risks were discussed with the patient.                            All questions were answered, and informed consent                            was obtained. Prior Anticoagulants: The patient has  taken no anticoagulant or antiplatelet agents. ASA                            Grade Assessment: III - A patient with severe                            systemic disease. After reviewing the risks and                            benefits, the patient was deemed in satisfactory                            condition to undergo the procedure.                            After obtaining informed consent, the endoscope was                            passed under direct vision. Throughout the                            procedure, the patient's blood pressure, pulse, and                            oxygen saturations were monitored continuously. The                            [id number]-H190TL ([id number]) Olympus colonoscope was                            introduced through the mouth and advanced to the                            proximal jejunum. The small bowel enteroscopy was                            accomplished without difficulty. The patient                            tolerated the procedure well. Scope In: Scope Out: Findings:      The examined esophagus was normal.      Five non-bleeding cratered gastric ulcers, one with small visible vessel       and one with pigmented material were found in the gastric body and in       the gastric antrum. The largest lesion was 6 mm in largest dimension.       Coagulation for hemostasis using monopolar probe was successful at the       visible vessel (there was bleeding with contact). Treatment effect was       good. For further hemostasis, three hemostatic clips were successfully       placed (MR conditional). There was no bleeding at the end of the       maneuver. A single hemostatic clip was placed (MR conditional) on the       small ulcer with pigmented material.  There was no evidence of significant pathology in the entire examined       duodenum.      There was no evidence of significant pathology in the proximal jejunum. Impression:               - Normal esophagus.                           - Multiple gastric ulcers. 2 gastric ulcers one                            with visible vessel and one with pigmented                            material. Vessel treated with a monopolar probe and                            clip placement x 3. Pigmented spot treated with                            clip placement x 1.  Clips (MR conditional) were                            placed.                           - Normal examined duodenum.                           - The examined portion of the jejunum was normal.                           - No specimens collected. Moderate Sedation:      N/A Recommendation:           - Return patient to hospital ward for ongoing care.                           - BID PPI.                           - No ASA or NSAIDs.                           - Follow-up IHC stain for H. Pylori status from                            gastric biopsies done on 05/18/24.                           - Soft diet.                           - Monitor Hgb. Procedure Code(s):        --- Professional ---  [phone], Small intestinal endoscopy, enteroscopy                            beyond second portion of duodenum, not including                            ileum; with control of bleeding (eg, injection,                            bipolar cautery, unipolar cautery, laser, heater                            probe, stapler, plasma coagulator) Diagnosis Code(s):        --- Professional ---                           K25.9, Gastric ulcer, unspecified as acute or                            chronic, without hemorrhage or perforation                           K92.1, Melena (includes Hematochezia) CPT copyright 2022 American Medical Association. All rights reserved. The codes documented in this report are preliminary and upon coder review may  be revised to meet current compliance requirements. Gordy CHRISTELLA Starch, MD 05/24/2024 11:57:09 AM This report has been signed electronically. Number of Addenda: 0

## 2024-05-24 NOTE — Interval H&P Note (Signed)
 History and Physical Interval Note: For enteroscopy today to evaluate persistent melena and drop in hemoglobin.  If no source found will proceed with video capsule endoscopy endoscopic placement The nature of the procedure, as well as the risks, benefits, and alternatives were carefully and thoroughly reviewed with the patient. Ample time for discussion and questions allowed. The patient understood, was satisfied, and agreed to proceed.      Latest Ref Rng & Units 05/24/2024    4:23 AM 05/23/2024    6:10 PM 05/23/2024    9:29 AM  CBC  WBC 4.0 - 10.5 K/uL 4.3  5.4  4.2   Hemoglobin 12.0 - 15.0 g/dL 9.1  88.8  9.0   Hematocrit 36.0 - 46.0 % 28.1  34.3  27.6   Platelets 150 - 400 K/uL 196  181  171      05/24/2024 10:25 AM  Alyssa Carey  has presented today for surgery, with the diagnosis of GI bleeding, melena.  The various methods of treatment have been discussed with the patient and family. After consideration of risks, benefits and other options for treatment, the patient has consented to  Procedure(s): ENTEROSCOPY (N/A) as a surgical intervention.  The patient's history has been reviewed, patient examined, no change in status, stable for surgery.  I have reviewed the patient's chart and labs.  Questions were answered to the patient's satisfaction.     Alyssa Carey [name]

## 2024-05-24 NOTE — Plan of Care (Signed)
   Problem: Clinical Measurements: Goal: Diagnostic test results will improve Outcome: Progressing   Problem: Nutrition: Goal: Adequate nutrition will be maintained Outcome: Progressing

## 2024-05-24 NOTE — Anesthesia Preprocedure Evaluation (Addendum)
 Anesthesia Evaluation  Patient identified by MRN, date of birth, ID band Patient awake    Reviewed: Allergy & Precautions, NPO status , Patient's Chart, lab work & pertinent test results  History of Anesthesia Complications Negative for: history of anesthetic complications  Airway Mallampati: III  TM Distance: >3 FB     Dental  (+) Dental Advisory Given   Pulmonary neg shortness of breath, neg sleep apnea, neg COPD, neg recent URI   breath sounds clear to auscultation       Cardiovascular hypertension, +CHF   Rhythm:Regular   1. Left ventricular ejection fraction, by estimation, is 60 to 65%. The  left ventricle has normal function. The left ventricle demonstrates  regional wall motion abnormalities, lateral wall appears to be  hypokinetic. Consider Limited Echo with contrast  for evaluation of RWMA. Left ventricular diastolic parameters are  consistent with Grade I diastolic dysfunction (impaired relaxation).   2. Right ventricular systolic function is normal. The right ventricular  size is normal. There is normal pulmonary artery systolic pressure.   3. The mitral valve is normal in structure. Mild mitral valve  regurgitation. No evidence of mitral stenosis.   4. The aortic valve is normal in structure. Aortic valve regurgitation is  not visualized. No aortic stenosis is present.   5. The inferior vena cava is normal in size with greater than 50%  respiratory variability, suggesting right atrial pressure of 3 mmHg.     Neuro/Psych    GI/Hepatic PUD,,,(+) Hepatitis -Lab Results      Component                Value               Date                      ALT                      17                  05/22/2024                AST                      68 (H)              05/22/2024                GGT                      531 (H)             12/22/2019                ALKPHOS                  112                 05/22/2024                 BILITOT                  1.5 (H)             05/22/2024              Endo/Other    Renal/GU Lab Results      Component  Value               Date                      NA                       135                 05/24/2024                K                        3.9                 05/24/2024                CO2                      21 (L)              05/24/2024                GLUCOSE                  100 (H)             05/24/2024                BUN                      7                   05/24/2024                CREATININE               0.64                05/24/2024                CALCIUM                  8.5 (L)             05/24/2024                GFRNONAA                 >60                 05/24/2024                Musculoskeletal   Abdominal   Peds  Hematology  (+) Blood dyscrasia, anemia Lab Results      Component                Value               Date                      WBC                      4.3                 05/24/2024                HGB  9.1 (L)             05/24/2024                HCT                      28.1 (L)            05/24/2024                MCV                      88.9                05/24/2024                PLT                      196                 05/24/2024              Anesthesia Other Findings   Reproductive/Obstetrics                              Anesthesia Physical Anesthesia Plan  ASA: 3  Anesthesia Plan: MAC   Post-op Pain Management: Minimal or no pain anticipated   Induction: Intravenous  PONV Risk Score and Plan: 2 and Propofol  infusion and Treatment may vary due to age or medical condition  Airway Management Planned: Nasal Cannula, Natural Airway and Simple Face Mask  Additional Equipment: None  Intra-op Plan:   Post-operative Plan:   Informed Consent: I have reviewed the patients History and Physical, chart, labs and discussed the  procedure including the risks, benefits and alternatives for the proposed anesthesia with the patient or authorized representative who has indicated his/her understanding and acceptance.     Dental advisory given  Plan Discussed with: CRNA  Anesthesia Plan Comments:          Anesthesia Quick Evaluation

## 2024-05-25 ENCOUNTER — Other Ambulatory Visit (HOSPITAL_COMMUNITY): Payer: Self-pay

## 2024-05-25 LAB — CBC WITH DIFFERENTIAL/PLATELET
Abs Immature Granulocytes: 0.02 K/uL (ref 0.00–0.07)
Basophils Absolute: 0 K/uL (ref 0.0–0.1)
Basophils Relative: 1 %
Eosinophils Absolute: 0.1 K/uL (ref 0.0–0.5)
Eosinophils Relative: 2 %
HCT: 26.3 % — ABNORMAL LOW (ref 36.0–46.0)
Hemoglobin: 8.5 g/dL — ABNORMAL LOW (ref 12.0–15.0)
Immature Granulocytes: 1 %
Lymphocytes Relative: 39 %
Lymphs Abs: 1.5 K/uL (ref 0.7–4.0)
MCH: 29 pg (ref 26.0–34.0)
MCHC: 32.3 g/dL (ref 30.0–36.0)
MCV: 89.8 fL (ref 80.0–100.0)
Monocytes Absolute: 0.5 K/uL (ref 0.1–1.0)
Monocytes Relative: 14 %
Neutro Abs: 1.8 K/uL (ref 1.7–7.7)
Neutrophils Relative %: 43 %
Platelets: 201 K/uL (ref 150–400)
RBC: 2.93 MIL/uL — ABNORMAL LOW (ref 3.87–5.11)
RDW: 19.1 % — ABNORMAL HIGH (ref 11.5–15.5)
WBC: 3.9 K/uL — ABNORMAL LOW (ref 4.0–10.5)
nRBC: 0 % (ref 0.0–0.2)

## 2024-05-25 NOTE — Progress Notes (Signed)
    Progress Note   Assessment    53 year old female with gastric ulcer disease with bleeding, EtOH cirrhosis, chronic anemia, GERD with esophageal stricture, hypertension  Principal Problem:   GI bleed Active Problems:   Esophageal stricture   Chronic gastric ulcer with hemorrhage   Acute gastric ulcer with hemorrhage   Occult GI bleeding   Recommendations   1.  Gastric ulcer with rebleeding --EGD yesterday 1 ulcer had a visible vessel, 1 had a pigmented spot.  Both were treated.  Hemoglobin has drifted a bit but there is been no further melena.  At this point I do think we have found the culprit for melena throughout the week -- Continue high-dose twice daily PPI; twice daily at least 8 weeks -- Agree with observation 1 more night; follow-up hemoglobin -- We reminded her today that she should strictly avoid NSAIDs -- Her IHC H. pylori stain will need to be followed up  2.  EtOH cirrhosis --no evidence for varices.  Outpatient follow-up is recommended -- Okay for Tylenol  up to 2 g/day  Call if questions  35 minutes total spent today including patient facing time, coordination of care, reviewing medical history/procedures/pertinent radiology studies, and documentation of the encounter.    Chief Complaint   Patient feeling well, tolerating diet No melena this morning No abdominal pain  Vital signs in last 24 hours: Temp:  [98.1 F (36.7 C)-98.6 F (37 C)] 98.1 F (36.7 C) (11/09 0819) Pulse Rate:  [87-97] 91 (11/09 0832) Resp:  [17-20] 17 (11/09 0832) BP: (95-129)/(57-93) 118/83 (11/09 0819) SpO2:  [95 %-100 %] 95 % (11/09 0832) Last BM Date : 05/23/24 Gen: awake, alert, NAD HEENT: anicteric  CV: RRR, no mrg Pulm: CTA b/l Abd: soft, NT/ND, +BS throughout Ext: no c/c/e Neuro: nonfocal  Intake/Output from previous day: 11/08 0701 - 11/09 0700 In: 370 [P.O.:120; I.V.:250] Out: 3 [Blood:3] Intake/Output this shift: No intake/output data recorded.  Lab  Results: Recent Labs    05/23/24 1810 05/24/24 0423 05/25/24 0418  WBC 5.4 4.3 3.9*  HGB 11.1* 9.1* 8.5*  HCT 34.3* 28.1* 26.3*  PLT 181 196 201   BMET Recent Labs    05/23/24 0337 05/24/24 0423  NA 130* 135  K 3.6 3.9  CL 100 104  CO2 20* 21*  GLUCOSE 103* 100*  BUN <5* 7  CREATININE 0.47 0.64  CALCIUM 8.2* 8.5*      LOS: 9 days   Gordy CHRISTELLA Starch, MD 05/25/2024, 11:59 AM See [facility], [facility], to contact our on call provider

## 2024-05-25 NOTE — Plan of Care (Signed)
   Problem: Clinical Measurements: Goal: Diagnostic test results will improve Outcome: Progressing   Problem: Nutrition: Goal: Adequate nutrition will be maintained Outcome: Progressing

## 2024-05-25 NOTE — Progress Notes (Signed)
 PROGRESS NOTE                                                                                                                                                                                                             Patient Demographics:    Alyssa Carey, is a 53 y.o. female, DOB - 10/22/1970, FMW:[id number]  Outpatient Primary MD for the patient is [name], MD    LOS - 9  Admit date - 05/16/2024    Chief Complaint  Patient presents with   GI Problem   Code STEMI       Brief Narrative (HPI from H&P)    54 y.o. female with medical history significant of alcoholic hepatitis, hyperlipidemia, hypertension, a flutter who presented with 6 days of nausea vomiting diarrhea.  Patient was at [facility] and numerous reported sick contacts.  She thought she had a gastrointestinal illness.  She presented to the ER where she was found to be afebrile and hemodynamically stable.  She was found to have hypokalemia, hypomagnesemia and elevated LFTs so she was admitted for further workup.  Her electrolytes were repleted.  At the outside hospital she was found to have elevated troponins with ST changes on EKG.  Cardiology was consulted and patient was recommended to start dual antiplatelet.  Patient was referred for outpatient follow-up however bloody stools and acute blood loss anemia was transferred for further management.    Subjective:   Patient in bed, appears comfortable, denies any headache, no fever, no chest pain or pressure, no shortness of breath , no abdominal pain. No focal weakness.   Assessment  & Plan :   Nausea vomiting and diarrhea, likely gastroenteritis.  History of chronic anemia on iron and folic acid  supplements, history of chronic loose stools takes Imodium  and cholestyramine  as needed.  Now upper GI bleed, EGD showing gastric ulcers and esophageal stricture requiring dilation.   She has been treated with  supportive care, on IV PPI, requested to stop using NSAIDs, seen by GI underwent EGD showing esophageal stricture and nonbleeding gastric ulcers, she was actually discharged on 05/19/2024 after which she had another episode of hematemesis, unfortunately she is still having melanotic stools on 05/21/2024, received 2 units of packed RBC on 05/21/2024, he was doing well unfortunately another melanotic stool morning of 05/23/2024 with mild drop in H&H.  She underwent repeat EGD on 05/24/2024 showing Multiple gastric ulcers. 2 gastric ulcers one with visible vessel and one with pigmented material. Vessel treated with a monopolar probe and  clip placement x 3. Pigmented spot treated with  clip placement x 1. Clips (MR conditional) were placed.  Continue to monitor CBC for another 24 hours.  No further bleeding thus far.   Random mildly elevated troponin at outlying ER.  Unclear reason why it was checked, he had no chest pain, no chest pain here, EKG shows changes suggestive of LVH, history of smoking, echo shows a preserved EF but does have wall motion abnormality.  Troponin was negative, case discussed with cardiologist Dr. Walton who has cleared her for EGD along with outpatient cardiology follow-up.  No inpatient testing.  If cleared by GI will place her on a beta-blocker, statin and baby aspirin  with outpatient cardiology follow-up.   Mild sore throat after EGD.  Resolved   Essential hypertension.  Placed on Coreg PCP to monitor   Hypokalemia and hypomagnesemia.  Replaced.   Hyponatremia.  Continue hydration.  Dyslipidemia.  Placed on Crestor for LDL to be at goal PCP to monitor.      Condition - Fair  Family Communication  :  None  Code Status :  Full  Consults  :  GI  PUD Prophylaxis : PPI   Procedures  :     Repeat EGD 05/24/2024.    Impression:               - Normal esophagus.                           - Multiple gastric ulcers. 2 gastric ulcers one                            with  visible vessel and one with pigmented                            material. Vessel treated with a monopolar probe and                            clip placement x 3. Pigmented spot treated with                            clip placement x 1. Clips (MR conditional) were                            placed.                           - Normal examined duodenum.                           - The examined portion of the jejunum was normal.                           - No specimens collected. Moderate Sedation:      N/A Recommendation:           - Return patient to hospital ward for ongoing care.                           -  BID PPI.                           - No ASA or NSAIDs.                           - Follow-up IHC stain for H. Pylori status from                            gastric biopsies done on 05/18/24.                           - Soft diet.                           - Monitor Hgb.   1st EGD 05/18/2024   Impression:               - Benign-appearing esophageal stenosis. Dilated. No                            evidence of esophageal varices.                           - Non-bleeding gastric ulcers with no stigmata of                            bleeding. Biopsied.                           - 2 cm hiatal hernia.                           - Normal examined duodenum.                           - Biopsies were taken with a cold forceps for                            evaluation of eosinophilic esophagitis. Moderate Sedation:      N/A Recommendation:           - Return patient to hospital ward for ongoing care.                           - Resume regular diet.                           - Continue Protonix  (pantoprazole ) 40 mg IV twice                            daily. She will need twice daily PO Protonix  for 8                            weeks, followed once daily PPI indefinitely                           - Repeat upper endoscopy in  8 weeks to check                            healing.                            - Await pathology results.   Echo -  1. Left ventricular ejection fraction, by estimation, is 60 to 65%. The left ventricle has normal function. The left ventricle demonstrates regional wall motion abnormalities, lateral wall appears to be hypokinetic. Consider Limited Echo with contrast for evaluation of RWMA. Left ventricular diastolic parameters are consistent with Grade I diastolic dysfunction (impaired relaxation).  2. Right ventricular systolic function is normal. The right ventricular size is normal. There is normal pulmonary artery systolic pressure.  3. The mitral valve is normal in structure. Mild mitral valve regurgitation. No evidence of mitral stenosis.  4. The aortic valve is normal in structure. Aortic valve regurgitation is not visualized. No aortic stenosis is present.  5. The inferior vena cava is normal in size with greater than 50% respiratory variability, suggesting right atrial pressure of 3 mmHg.       Disposition Plan  :    Status is: Inpatient   DVT Prophylaxis  :    SCDs Start: 05/16/24 2126    Lab Results  Component Value Date   PLT 201 05/25/2024    Diet :  Diet Order             DIET SOFT Fluid consistency: Thin  Diet effective now           Diet - low sodium heart healthy                    Inpatient Medications  Scheduled Meds:  carvedilol  6.25 mg Oral BID WC   feeding supplement  237 mL Oral BID BM   ferrous sulfate   325 mg Oral Q breakfast   folic acid   1 mg Oral Daily   hydrALAZINE   50 mg Oral Q8H   pantoprazole   40 mg Oral BID   rosuvastatin  10 mg Oral Daily   Continuous Infusions:    PRN Meds:.acetaminophen  **OR** acetaminophen , cholestyramine , cyclobenzaprine, diphenhydrAMINE , HYDROcodone -acetaminophen , hydrocortisone , loperamide , menthol , naLOXone (NARCAN)  injection, ondansetron  **OR** ondansetron  (ZOFRAN ) IV, phenol, traZODone   Antibiotics  :    Anti-infectives (From admission, onward)    None          Objective:   Vitals:   05/25/24 0501 05/25/24 0819 05/25/24 0829 05/25/24 0832  BP: 102/71 118/83    Pulse:   90 91  Resp:    17  Temp:  98.1 F (36.7 C)    TempSrc:  Oral    SpO2:    95%  Weight:      Height:        Wt Readings from Last 3 Encounters:  05/16/24 73.3 kg  02/18/24 72.4 kg  01/04/24 73 kg     Intake/Output Summary (Last 24 hours) at 05/25/2024 0908 Last data filed at 05/24/2024 2007 Gross per 24 hour  Intake 370 ml  Output 3 ml  Net 367 ml      Physical Exam  Awake Alert, No new F.N deficits, Normal affect [place].AT,PERRAL Supple Neck, No JVD,   Symmetrical Chest wall movement, Good air movement bilaterally, CTAB RRR,No Gallops,Rubs or new Murmurs,  +ve B.Sounds, Abd Soft, No tenderness,   No Cyanosis, Clubbing or edema  Data Review:    Recent Labs  Lab 05/21/24 0226 05/22/24 0557 05/22/24 1018 05/23/24 0337 05/23/24 0929 05/23/24 1810 05/24/24 0423 05/25/24 0418  WBC 6.1 4.7   < > 4.9 4.2 5.4 4.3 3.9*  HGB 5.7* 9.7*   < > 8.6* 9.0* 11.1* 9.1* 8.5*  HCT 17.3* 29.0*   < > 25.6* 27.6* 34.3* 28.1* 26.3*  PLT 168 156   < > 163 171 181 196 201  MCV 93.5 87.6   < > 86.8 88.7 89.8 88.9 89.8  MCH 30.8 29.3   < > 29.2 28.9 29.1 28.8 29.0  MCHC 32.9 33.4   < > 33.6 32.6 32.4 32.4 32.3  RDW 18.5* 20.8*   < > 19.9* 20.1* 20.2* 19.7* 19.1*  LYMPHSABS 1.4 1.1  --  1.4  --   --  1.6 1.5  MONOABS 0.5 0.4  --  0.7  --   --  0.5 0.5  EOSABS 0.1 0.1  --  0.1  --   --  0.1 0.1  BASOSABS 0.0 0.0  --  0.0  --   --  0.0 0.0   < > = values in this interval not displayed.    Recent Labs  Lab 05/19/24 0511 05/20/24 0359 05/21/24 0226 05/22/24 0313 05/23/24 0337 05/24/24 0423  NA 130* 129* 131* 131* 130* 135  K 4.5 4.9 3.7 3.6 3.6 3.9  CL 98 98 101 102 100 104  CO2 24 20* 19* 18* 20* 21*  ANIONGAP 8 11 11 11 10 10   GLUCOSE 105* 98 67* 65* 103* 100*  BUN 13 18 12 6  <5* 7  CREATININE 0.61 0.59 0.64 0.56 0.47 0.64  AST 102* 72* 58* 68*  --   --    ALT 26 21 17 17   --   --   ALKPHOS 146* 127* 105 112  --   --   BILITOT 1.5* 1.3* 0.9 1.5*  --   --   ALBUMIN  2.7* 2.8* 2.6* 2.7*  --   --   MG 1.6*  --   --   --  1.3* 1.7  CALCIUM 8.7* 8.4* 8.2* 8.2* 8.2* 8.5*      Recent Labs  Lab 05/19/24 0511 05/20/24 0359 05/21/24 0226 05/22/24 0313 05/23/24 0337 05/24/24 0423  MG 1.6*  --   --   --  1.3* 1.7  CALCIUM 8.7* 8.4* 8.2* 8.2* 8.2* 8.5*    --------------------------------------------------------------------------------------------------------------- Lab Results  Component Value Date   CHOL 194 05/19/2024   HDL 85 05/19/2024   LDLCALC 94 05/19/2024   TRIG 77 05/19/2024   CHOLHDL 2.3 05/19/2024    Lab Results  Component Value Date   HGBA1C 4.3 (L) 05/18/2024   No results for input(s): TSH, T4TOTAL, FREET4, T3FREE, THYROIDAB in the last 72 hours. No results for input(s): VITAMINB12, FOLATE, FERRITIN, TIBC, IRON, RETICCTPCT in the last 72 hours.  ------------------------------------------------------------------------------------------------------------------ Cardiac Enzymes No results for input(s): CKMB, TROPONINI, MYOGLOBIN in the last 168 hours.  Invalid input(s): CK  Micro Results Recent Results (from the past 240 hours)  MRSA Next Gen by PCR, Nasal     Status: None   Collection Time: 05/17/24  3:04 AM   Specimen: Nasal Mucosa; Nasal Swab  Result Value Ref Range Status   MRSA by PCR Next Gen NOT DETECTED NOT DETECTED Final    Comment: (NOTE) The GeneXpert MRSA Assay (FDA approved for NASAL specimens only), is one component of a comprehensive MRSA colonization surveillance program. It is not intended to  diagnose MRSA infection nor to guide or monitor treatment for MRSA infections. Test performance is not FDA approved in patients less than 51 years old. Performed at [facility], [address] [place], [place] [address]     Radiology Report  No results found.     Signature  -   Lavada Stank M.D on 05/25/2024 at 9:08 AM   -  To page go to [email or web address]

## 2024-05-26 ENCOUNTER — Encounter (HOSPITAL_COMMUNITY): Payer: Self-pay | Admitting: Internal Medicine

## 2024-05-26 ENCOUNTER — Other Ambulatory Visit (HOSPITAL_COMMUNITY): Payer: Self-pay

## 2024-05-26 LAB — CBC WITH DIFFERENTIAL/PLATELET
Abs Immature Granulocytes: 0.02 K/uL (ref 0.00–0.07)
Basophils Absolute: 0 K/uL (ref 0.0–0.1)
Basophils Relative: 1 %
Eosinophils Absolute: 0.1 K/uL (ref 0.0–0.5)
Eosinophils Relative: 2 %
HCT: 27.9 % — ABNORMAL LOW (ref 36.0–46.0)
Hemoglobin: 8.8 g/dL — ABNORMAL LOW (ref 12.0–15.0)
Immature Granulocytes: 1 %
Lymphocytes Relative: 40 %
Lymphs Abs: 1.5 K/uL (ref 0.7–4.0)
MCH: 28.9 pg (ref 26.0–34.0)
MCHC: 31.5 g/dL (ref 30.0–36.0)
MCV: 91.5 fL (ref 80.0–100.0)
Monocytes Absolute: 0.5 K/uL (ref 0.1–1.0)
Monocytes Relative: 13 %
Neutro Abs: 1.7 K/uL (ref 1.7–7.7)
Neutrophils Relative %: 43 %
Platelets: 217 K/uL (ref 150–400)
RBC: 3.05 MIL/uL — ABNORMAL LOW (ref 3.87–5.11)
RDW: 18.6 % — ABNORMAL HIGH (ref 11.5–15.5)
WBC: 3.8 K/uL — ABNORMAL LOW (ref 4.0–10.5)
nRBC: 0.5 % — ABNORMAL HIGH (ref 0.0–0.2)

## 2024-05-26 MED ORDER — HYDRALAZINE HCL 50 MG PO TABS
50.0000 mg | ORAL_TABLET | Freq: Three times a day (TID) | ORAL | 0 refills | Status: AC
  Filled 2024-05-26: qty 90, 30d supply, fill #0

## 2024-05-26 MED ORDER — CARVEDILOL 6.25 MG PO TABS
6.2500 mg | ORAL_TABLET | Freq: Two times a day (BID) | ORAL | 0 refills | Status: AC
  Filled 2024-05-26: qty 60, 30d supply, fill #0

## 2024-05-26 MED ORDER — PANTOPRAZOLE SODIUM 40 MG PO TBEC
40.0000 mg | DELAYED_RELEASE_TABLET | Freq: Two times a day (BID) | ORAL | 2 refills | Status: AC
  Filled 2024-05-26: qty 60, 30d supply, fill #0

## 2024-05-26 NOTE — Progress Notes (Signed)
 Discharge instructions given. Pt verbalized understanding and all questions were answered. Pt wants to take morning medications once she gets home. VS within normal limits.

## 2024-05-27 LAB — SURGICAL PATHOLOGY

## 2024-05-29 ENCOUNTER — Ambulatory Visit: Payer: Self-pay | Admitting: Gastroenterology

## 2024-05-29 NOTE — Progress Notes (Signed)
 Alyssa Carey,  The biopsies taken from your stomach were notable for mild chronic gastritis (inflammation) which is a common finding, but there was no evidence of Helicobacter pylori infection. The ulcers in your stomach were most likely from medications (NSAIDs). Please completely avoid all NSAIDs going forward and continue to take the pantoprazole  twice daily as instructed.  Please follow up with your primary gastoroenterologist in [place] to discuss repeat upper endoscopy to assess healing of your gastric ulcers.  The biopsies of your esophagus showed features most suggestive of acid reflux, but no evidence of eosinophilic esophagitis or infectious esophagitis.  This should heal with the pantoprazole  as well.

## 2024-06-03 NOTE — Anesthesia Postprocedure Evaluation (Signed)
 Anesthesia Post Note  Patient: Alyssa Carey  Procedure(s) Performed: ENTEROSCOPY CONTROL OF HEMORRHAGE, GI TRACT, ENDOSCOPIC, BY CLIPPING OR OVERSEWING     Patient location during evaluation: PACU Anesthesia Type: MAC Level of consciousness: awake and alert Pain management: pain level controlled Vital Signs Assessment: post-procedure vital signs reviewed and stable Respiratory status: spontaneous breathing, nonlabored ventilation and respiratory function stable Cardiovascular status: stable and blood pressure returned to baseline Postop Assessment: no apparent nausea or vomiting Anesthetic complications: no   No notable events documented.               Kameka Whan
